# Patient Record
Sex: Male | Born: 1955 | Race: Black or African American | Hispanic: No | Marital: Married | Smoking: Former smoker
Health system: Southern US, Community
[De-identification: ages and names within clinical notes are randomized; demographics above are authoritative.]

## PROBLEM LIST (undated history)

## (undated) DIAGNOSIS — I469 Cardiac arrest, cause unspecified: Secondary | ICD-10-CM

## (undated) DIAGNOSIS — I509 Heart failure, unspecified: Secondary | ICD-10-CM

## (undated) DIAGNOSIS — I472 Ventricular tachycardia, unspecified: Secondary | ICD-10-CM

## (undated) DIAGNOSIS — IMO0001 Reserved for inherently not codable concepts without codable children: Secondary | ICD-10-CM

## (undated) DIAGNOSIS — J189 Pneumonia, unspecified organism: Secondary | ICD-10-CM

## (undated) DIAGNOSIS — E119 Type 2 diabetes mellitus without complications: Secondary | ICD-10-CM

## (undated) DIAGNOSIS — Z95810 Presence of automatic (implantable) cardiac defibrillator: Secondary | ICD-10-CM

## (undated) DIAGNOSIS — R21 Rash and other nonspecific skin eruption: Secondary | ICD-10-CM

## (undated) DIAGNOSIS — B1920 Unspecified viral hepatitis C without hepatic coma: Secondary | ICD-10-CM

## (undated) DIAGNOSIS — Z992 Dependence on renal dialysis: Secondary | ICD-10-CM

## (undated) DIAGNOSIS — E785 Hyperlipidemia, unspecified: Secondary | ICD-10-CM

## (undated) DIAGNOSIS — T8859XA Other complications of anesthesia, initial encounter: Secondary | ICD-10-CM

## (undated) DIAGNOSIS — E669 Obesity, unspecified: Secondary | ICD-10-CM

## (undated) DIAGNOSIS — K567 Ileus, unspecified: Secondary | ICD-10-CM

## (undated) DIAGNOSIS — I219 Acute myocardial infarction, unspecified: Secondary | ICD-10-CM

## (undated) DIAGNOSIS — T4145XA Adverse effect of unspecified anesthetic, initial encounter: Secondary | ICD-10-CM

## (undated) DIAGNOSIS — N183 Chronic kidney disease, stage 3 (moderate): Secondary | ICD-10-CM

## (undated) DIAGNOSIS — Z4502 Encounter for adjustment and management of automatic implantable cardiac defibrillator: Secondary | ICD-10-CM

## (undated) DIAGNOSIS — N186 End stage renal disease: Secondary | ICD-10-CM

## (undated) DIAGNOSIS — Z8679 Personal history of other diseases of the circulatory system: Secondary | ICD-10-CM

## (undated) DIAGNOSIS — I462 Cardiac arrest due to underlying cardiac condition: Secondary | ICD-10-CM

## (undated) DIAGNOSIS — R609 Edema, unspecified: Secondary | ICD-10-CM

## (undated) DIAGNOSIS — R079 Chest pain, unspecified: Secondary | ICD-10-CM

## (undated) DIAGNOSIS — Z9289 Personal history of other medical treatment: Secondary | ICD-10-CM

## (undated) DIAGNOSIS — I10 Essential (primary) hypertension: Secondary | ICD-10-CM

## (undated) DIAGNOSIS — B86 Scabies: Secondary | ICD-10-CM

## (undated) HISTORY — PX: EYE SURGERY: SHX253

## (undated) HISTORY — DX: Encounter for adjustment and management of automatic implantable cardiac defibrillator: Z45.02

## (undated) HISTORY — DX: Obesity, unspecified: E66.9

## (undated) HISTORY — DX: Ventricular tachycardia, unspecified: I47.20

## (undated) HISTORY — DX: Ventricular tachycardia: I47.2

## (undated) HISTORY — PX: BACK SURGERY: SHX140

## (undated) HISTORY — DX: Chest pain, unspecified: R07.9

## (undated) HISTORY — PX: CARDIAC CATHETERIZATION: SHX172

## (undated) HISTORY — PX: OTHER SURGICAL HISTORY: SHX169

---
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
## 1997-01-23 HISTORY — PX: CORONARY ARTERY BYPASS GRAFT: SHX141

## 2013-03-07 ENCOUNTER — Inpatient Hospital Stay (HOSPITAL_COMMUNITY): Payer: Medicaid Other

## 2013-03-07 ENCOUNTER — Inpatient Hospital Stay (HOSPITAL_COMMUNITY)
Admission: EM | Admit: 2013-03-07 | Discharge: 2013-03-25 | DRG: 224 | Disposition: A | Discharge status: Home Health | Payer: Medicaid Other | Attending: Internal Medicine | Admitting: Internal Medicine

## 2013-03-07 ENCOUNTER — Encounter (HOSPITAL_COMMUNITY): Payer: Self-pay | Admitting: Emergency Medicine

## 2013-03-07 ENCOUNTER — Emergency Department (HOSPITAL_COMMUNITY): Payer: Medicaid Other

## 2013-03-07 ENCOUNTER — Encounter (HOSPITAL_COMMUNITY)
Admission: EM | Disposition: A | Discharge status: Home Health | Payer: Self-pay | Source: Home / Self Care | Attending: Pulmonary Disease

## 2013-03-07 DIAGNOSIS — R339 Retention of urine, unspecified: Secondary | ICD-10-CM | POA: Diagnosis not present

## 2013-03-07 DIAGNOSIS — Z951 Presence of aortocoronary bypass graft: Secondary | ICD-10-CM

## 2013-03-07 DIAGNOSIS — E876 Hypokalemia: Secondary | ICD-10-CM | POA: Diagnosis not present

## 2013-03-07 DIAGNOSIS — I498 Other specified cardiac arrhythmias: Secondary | ICD-10-CM | POA: Diagnosis not present

## 2013-03-07 DIAGNOSIS — I472 Ventricular tachycardia, unspecified: Secondary | ICD-10-CM | POA: Insufficient documentation

## 2013-03-07 DIAGNOSIS — Y9210 Unspecified residential institution as the place of occurrence of the external cause: Secondary | ICD-10-CM | POA: Diagnosis not present

## 2013-03-07 DIAGNOSIS — J150 Pneumonia due to Klebsiella pneumoniae: Secondary | ICD-10-CM | POA: Diagnosis not present

## 2013-03-07 DIAGNOSIS — I501 Left ventricular failure: Secondary | ICD-10-CM | POA: Diagnosis present

## 2013-03-07 DIAGNOSIS — E7800 Pure hypercholesterolemia, unspecified: Secondary | ICD-10-CM | POA: Diagnosis present

## 2013-03-07 DIAGNOSIS — I469 Cardiac arrest, cause unspecified: Secondary | ICD-10-CM | POA: Diagnosis present

## 2013-03-07 DIAGNOSIS — IMO0002 Reserved for concepts with insufficient information to code with codable children: Secondary | ICD-10-CM | POA: Diagnosis not present

## 2013-03-07 DIAGNOSIS — J9600 Acute respiratory failure, unspecified whether with hypoxia or hypercapnia: Secondary | ICD-10-CM | POA: Diagnosis present

## 2013-03-07 DIAGNOSIS — E8779 Other fluid overload: Secondary | ICD-10-CM | POA: Diagnosis not present

## 2013-03-07 DIAGNOSIS — T462X5A Adverse effect of other antidysrhythmic drugs, initial encounter: Secondary | ICD-10-CM | POA: Diagnosis not present

## 2013-03-07 DIAGNOSIS — E870 Hyperosmolality and hypernatremia: Secondary | ICD-10-CM | POA: Diagnosis not present

## 2013-03-07 DIAGNOSIS — G931 Anoxic brain damage, not elsewhere classified: Secondary | ICD-10-CM | POA: Diagnosis present

## 2013-03-07 DIAGNOSIS — I4901 Ventricular fibrillation: Principal | ICD-10-CM | POA: Diagnosis present

## 2013-03-07 DIAGNOSIS — E875 Hyperkalemia: Secondary | ICD-10-CM | POA: Diagnosis not present

## 2013-03-07 DIAGNOSIS — J9601 Acute respiratory failure with hypoxia: Secondary | ICD-10-CM

## 2013-03-07 DIAGNOSIS — I2582 Chronic total occlusion of coronary artery: Secondary | ICD-10-CM | POA: Diagnosis present

## 2013-03-07 DIAGNOSIS — I10 Essential (primary) hypertension: Secondary | ICD-10-CM | POA: Diagnosis present

## 2013-03-07 DIAGNOSIS — Q039 Congenital hydrocephalus, unspecified: Secondary | ICD-10-CM

## 2013-03-07 DIAGNOSIS — D6489 Other specified anemias: Secondary | ICD-10-CM | POA: Diagnosis not present

## 2013-03-07 DIAGNOSIS — D696 Thrombocytopenia, unspecified: Secondary | ICD-10-CM | POA: Diagnosis present

## 2013-03-07 DIAGNOSIS — I2510 Atherosclerotic heart disease of native coronary artery without angina pectoris: Secondary | ICD-10-CM | POA: Diagnosis present

## 2013-03-07 DIAGNOSIS — E119 Type 2 diabetes mellitus without complications: Secondary | ICD-10-CM | POA: Diagnosis present

## 2013-03-07 DIAGNOSIS — Z7982 Long term (current) use of aspirin: Secondary | ICD-10-CM

## 2013-03-07 DIAGNOSIS — Z79899 Other long term (current) drug therapy: Secondary | ICD-10-CM

## 2013-03-07 DIAGNOSIS — R569 Unspecified convulsions: Secondary | ICD-10-CM

## 2013-03-07 DIAGNOSIS — E785 Hyperlipidemia, unspecified: Secondary | ICD-10-CM | POA: Diagnosis present

## 2013-03-07 DIAGNOSIS — I4729 Other ventricular tachycardia: Secondary | ICD-10-CM | POA: Diagnosis present

## 2013-03-07 HISTORY — DX: Essential (primary) hypertension: I10

## 2013-03-07 HISTORY — PX: LEFT HEART CATHETERIZATION WITH CORONARY/GRAFT ANGIOGRAM: SHX5450

## 2013-03-07 HISTORY — DX: Cardiac arrest, cause unspecified: I46.9

## 2013-03-07 HISTORY — DX: Edema, unspecified: R60.9

## 2013-03-07 HISTORY — DX: Cardiac arrest due to underlying cardiac condition: I46.2

## 2013-03-07 HISTORY — DX: Hyperlipidemia, unspecified: E78.5

## 2013-03-07 LAB — URINALYSIS, ROUTINE W REFLEX MICROSCOPIC
Bilirubin Urine: NEGATIVE
GLUCOSE, UA: NEGATIVE mg/dL
Ketones, ur: NEGATIVE mg/dL
Leukocytes, UA: NEGATIVE
NITRITE: NEGATIVE
Protein, ur: >300 mg/dL — AB
SPECIFIC GRAVITY, URINE: 1.017 (ref 1.005–1.030)
Urobilinogen, UA: 2 mg/dL — ABNORMAL HIGH (ref 0.0–1.0)
pH: 7 (ref 5.0–8.0)

## 2013-03-07 LAB — COMPREHENSIVE METABOLIC PANEL
ALK PHOS: 59 U/L (ref 39–117)
ALT: 54 U/L — AB (ref 0–53)
ALT: 59 U/L — AB (ref 0–53)
AST: 45 U/L — AB (ref 0–37)
AST: 49 U/L — ABNORMAL HIGH (ref 0–37)
Albumin: 2.6 g/dL — ABNORMAL LOW (ref 3.5–5.2)
Albumin: 2.3 g/dL — ABNORMAL LOW (ref 3.5–5.2)
Alkaline Phosphatase: 61 U/L (ref 39–117)
BILIRUBIN TOTAL: 0.3 mg/dL (ref 0.3–1.2)
BUN: 17 mg/dL (ref 6–23)
BUN: 19 mg/dL (ref 6–23)
CALCIUM: 8.5 mg/dL (ref 8.4–10.5)
CO2: 22 meq/L (ref 19–32)
CO2: 21 mEq/L (ref 19–32)
Calcium: 8.8 mg/dL (ref 8.4–10.5)
Chloride: 102 mEq/L (ref 96–112)
Chloride: 99 mEq/L (ref 96–112)
Creatinine, Ser: 0.91 mg/dL (ref 0.50–1.35)
Creatinine, Ser: 1.06 mg/dL (ref 0.50–1.35)
GFR calc Af Amer: 87 mL/min — ABNORMAL LOW (ref >90)
GFR calc non Af Amer: >90 mL/min (ref >90)
GFR, EST NON AFRICAN AMERICAN: 75 mL/min — AB (ref >90)
GLUCOSE: 206 mg/dL — AB (ref 70–99)
Glucose, Bld: 229 mg/dL — ABNORMAL HIGH (ref 70–99)
POTASSIUM: 4 meq/L (ref 3.7–5.3)
Potassium: 3.9 mEq/L (ref 3.7–5.3)
SODIUM: 136 meq/L — AB (ref 137–147)
Sodium: 139 mEq/L (ref 137–147)
Total Bilirubin: 0.4 mg/dL (ref 0.3–1.2)
Total Protein: 8.2 g/dL (ref 6.0–8.3)
Total Protein: 7.6 g/dL (ref 6.0–8.3)

## 2013-03-07 LAB — TRIGLYCERIDES: Triglycerides: 124 mg/dL (ref <150)

## 2013-03-07 LAB — DIFFERENTIAL
Basophils Absolute: 0 10*3/uL (ref 0.0–0.1)
Basophils Relative: 0 % (ref 0–1)
Eosinophils Absolute: 0.1 10*3/uL (ref 0.0–0.7)
Eosinophils Relative: 2 % (ref 0–5)
LYMPHS ABS: 2.3 10*3/uL (ref 0.7–4.0)
LYMPHS PCT: 39 % (ref 12–46)
Monocytes Absolute: 0.4 10*3/uL (ref 0.1–1.0)
Monocytes Relative: 7 % (ref 3–12)
Neutro Abs: 3.1 10*3/uL (ref 1.7–7.7)
Neutrophils Relative %: 52 % (ref 43–77)

## 2013-03-07 LAB — MRSA PCR SCREENING: MRSA by PCR: NEGATIVE

## 2013-03-07 LAB — LACTIC ACID, PLASMA: Lactic Acid, Venous: 2.7 mmol/L — ABNORMAL HIGH (ref 0.5–2.2)

## 2013-03-07 LAB — POCT I-STAT, CHEM 8
BUN: 18 mg/dL (ref 6–23)
CALCIUM ION: 1.19 mmol/L (ref 1.12–1.23)
CREATININE: 1 mg/dL (ref 0.50–1.35)
Chloride: 100 mEq/L (ref 96–112)
GLUCOSE: 212 mg/dL — AB (ref 70–99)
HCT: 37 % — ABNORMAL LOW (ref 39.0–52.0)
HEMOGLOBIN: 12.6 g/dL — AB (ref 13.0–17.0)
POTASSIUM: 3.8 meq/L (ref 3.7–5.3)
Sodium: 141 mEq/L (ref 137–147)
TCO2: 24 mmol/L (ref 0–100)

## 2013-03-07 LAB — GLUCOSE, CAPILLARY
GLUCOSE-CAPILLARY: 190 mg/dL — AB (ref 70–99)
Glucose-Capillary: 191 mg/dL — ABNORMAL HIGH (ref 70–99)
Glucose-Capillary: 172 mg/dL — ABNORMAL HIGH (ref 70–99)
Glucose-Capillary: 168 mg/dL — ABNORMAL HIGH (ref 70–99)
Glucose-Capillary: 211 mg/dL — ABNORMAL HIGH (ref 70–99)
Glucose-Capillary: 228 mg/dL — ABNORMAL HIGH (ref 70–99)

## 2013-03-07 LAB — BASIC METABOLIC PANEL
BUN: 22 mg/dL (ref 6–23)
BUN: 23 mg/dL (ref 6–23)
CHLORIDE: 99 meq/L (ref 96–112)
CO2: 24 meq/L (ref 19–32)
CO2: 24 meq/L (ref 19–32)
Calcium: 8 mg/dL — ABNORMAL LOW (ref 8.4–10.5)
Calcium: 8.2 mg/dL — ABNORMAL LOW (ref 8.4–10.5)
Chloride: 102 mEq/L (ref 96–112)
Creatinine, Ser: 1.16 mg/dL (ref 0.50–1.35)
Creatinine, Ser: 1.06 mg/dL (ref 0.50–1.35)
GFR calc Af Amer: 78 mL/min — ABNORMAL LOW (ref >90)
GFR calc Af Amer: 87 mL/min — ABNORMAL LOW (ref >90)
GFR calc non Af Amer: 67 mL/min — ABNORMAL LOW (ref >90)
GFR calc non Af Amer: 75 mL/min — ABNORMAL LOW (ref >90)
Glucose, Bld: 196 mg/dL — ABNORMAL HIGH (ref 70–99)
Glucose, Bld: 295 mg/dL — ABNORMAL HIGH (ref 70–99)
POTASSIUM: 4.8 meq/L (ref 3.7–5.3)
POTASSIUM: 4.1 meq/L (ref 3.7–5.3)
SODIUM: 137 meq/L (ref 137–147)
SODIUM: 138 meq/L (ref 137–147)

## 2013-03-07 LAB — POCT I-STAT 3, ART BLOOD GAS (G3+)
ACID-BASE DEFICIT: 8 mmol/L — AB (ref 0.0–2.0)
Acid-Base Excess: 2 mmol/L (ref 0.0–2.0)
Acid-Base Excess: 2 mmol/L (ref 0.0–2.0)
BICARBONATE: 27.4 meq/L — AB (ref 20.0–24.0)
BICARBONATE: 19.1 meq/L — AB (ref 20.0–24.0)
BICARBONATE: 27.2 meq/L — AB (ref 20.0–24.0)
O2 Saturation: 100 %
O2 Saturation: 92 %
O2 Saturation: 100 %
PH ART: 7.38 (ref 7.350–7.450)
PH ART: 7.408 (ref 7.350–7.450)
PO2 ART: 282 mmHg — AB (ref 80.0–100.0)
Patient temperature: 98.6
Patient temperature: 98.7
TCO2: 29 mmol/L (ref 0–100)
TCO2: 20 mmol/L (ref 0–100)
TCO2: 28 mmol/L (ref 0–100)
pCO2 arterial: 46.2 mmHg — ABNORMAL HIGH (ref 35.0–45.0)
pCO2 arterial: 42.4 mmHg (ref 35.0–45.0)
pCO2 arterial: 43.1 mmHg (ref 35.0–45.0)
pH, Arterial: 7.261 — ABNORMAL LOW (ref 7.350–7.450)
pO2, Arterial: 236 mmHg — ABNORMAL HIGH (ref 80.0–100.0)
pO2, Arterial: 74 mmHg — ABNORMAL LOW (ref 80.0–100.0)

## 2013-03-07 LAB — CBC
HCT: 33.5 % — ABNORMAL LOW (ref 39.0–52.0)
HCT: 33.9 % — ABNORMAL LOW (ref 39.0–52.0)
Hemoglobin: 11.8 g/dL — ABNORMAL LOW (ref 13.0–17.0)
Hemoglobin: 12.1 g/dL — ABNORMAL LOW (ref 13.0–17.0)
MCH: 31.1 pg (ref 26.0–34.0)
MCH: 31.5 pg (ref 26.0–34.0)
MCHC: 35.2 g/dL (ref 30.0–36.0)
MCHC: 35.7 g/dL (ref 30.0–36.0)
MCV: 88.3 fL (ref 78.0–100.0)
MCV: 88.4 fL (ref 78.0–100.0)
PLATELETS: 208 10*3/uL (ref 150–400)
Platelets: 166 10*3/uL (ref 150–400)
RBC: 3.79 MIL/uL — AB (ref 4.22–5.81)
RBC: 3.84 MIL/uL — ABNORMAL LOW (ref 4.22–5.81)
RDW: 12.9 % (ref 11.5–15.5)
RDW: 12.9 % (ref 11.5–15.5)
WBC: 6 10*3/uL (ref 4.0–10.5)
WBC: 6.4 10*3/uL (ref 4.0–10.5)

## 2013-03-07 LAB — HEMOGLOBIN A1C
Hgb A1c MFr Bld: 6.3 % — ABNORMAL HIGH (ref <5.7)
Mean Plasma Glucose: 134 mg/dL — ABNORMAL HIGH (ref <117)

## 2013-03-07 LAB — APTT
APTT: 31 s (ref 24–37)
APTT: 31 s (ref 24–37)

## 2013-03-07 LAB — PROTIME-INR
INR: 1.21 (ref 0.00–1.49)
INR: 1.28 (ref 0.00–1.49)
INR: 1.32 (ref 0.00–1.49)
PROTHROMBIN TIME: 15 s (ref 11.6–15.2)
PROTHROMBIN TIME: 15.7 s — AB (ref 11.6–15.2)
Prothrombin Time: 16.1 seconds — ABNORMAL HIGH (ref 11.6–15.2)

## 2013-03-07 LAB — POCT I-STAT TROPONIN I: Troponin i, poc: 0.02 ng/mL (ref 0.00–0.08)

## 2013-03-07 LAB — CORTISOL: Cortisol, Plasma: 18.8 ug/dL

## 2013-03-07 LAB — RAPID URINE DRUG SCREEN, HOSP PERFORMED
Amphetamines: NOT DETECTED
BARBITURATES: NOT DETECTED
BENZODIAZEPINES: NOT DETECTED
COCAINE: NOT DETECTED
Opiates: NOT DETECTED
Tetrahydrocannabinol: NOT DETECTED

## 2013-03-07 LAB — PROCALCITONIN

## 2013-03-07 LAB — PHOSPHORUS: Phosphorus: 3.8 mg/dL (ref 2.3–4.6)

## 2013-03-07 LAB — URINE MICROSCOPIC-ADD ON

## 2013-03-07 LAB — TROPONIN I: Troponin I: <0.3 ng/mL (ref <0.30)

## 2013-03-07 LAB — MAGNESIUM: Magnesium: 1.5 mg/dL (ref 1.5–2.5)

## 2013-03-07 LAB — POCT ACTIVATED CLOTTING TIME: Activated Clotting Time: 144 seconds

## 2013-03-07 SURGERY — LEFT HEART CATHETERIZATION WITH CORONARY/GRAFT ANGIOGRAM

## 2013-03-07 MED ORDER — ACETAMINOPHEN 160 MG/5ML PO SOLN
650.0000 mg | Freq: Four times a day (QID) | ORAL | Status: DC | PRN
  Administered 2013-03-07 – 2013-03-12 (×6): 650 mg
  Filled 2013-03-07 (×6): qty 20.3

## 2013-03-07 MED ORDER — PROPOFOL 10 MG/ML IV EMUL
0.0000 ug/kg/min | INTRAVENOUS | Status: DC
  Administered 2013-03-07: 1 ug/kg/min via INTRAVENOUS
  Administered 2013-03-07 (×5): 10 ug/kg/min via INTRAVENOUS
  Administered 2013-03-08: 30 ug/kg/min via INTRAVENOUS
  Filled 2013-03-07 (×4): qty 100

## 2013-03-07 MED ORDER — PANTOPRAZOLE SODIUM 40 MG IV SOLR
40.0000 mg | Freq: Every day | INTRAVENOUS | Status: DC
  Administered 2013-03-07 – 2013-03-11 (×5): 40 mg via INTRAVENOUS
  Filled 2013-03-07 (×6): qty 40

## 2013-03-07 MED ORDER — CISATRACURIUM BOLUS VIA INFUSION: 0.1000 mg/kg | Freq: Once | INTRAVENOUS | Status: DC

## 2013-03-07 MED ORDER — FENTANYL CITRATE 0.05 MG/ML IJ SOLN
0.0000 ug/h | INTRAMUSCULAR | Status: DC
  Administered 2013-03-07: 50 ug/h via INTRAVENOUS
  Administered 2013-03-08: 150 ug/h via INTRAVENOUS
  Filled 2013-03-07 (×2): qty 50

## 2013-03-07 MED ORDER — NITROGLYCERIN IN D5W 200-5 MCG/ML-% IV SOLN
10.0000 ug/min | INTRAVENOUS | Status: DC
Start: 2013-03-07 — End: 2013-03-08
  Administered 2013-03-07: 10 ug/min via INTRAVENOUS
  Filled 2013-03-07: qty 250

## 2013-03-07 MED ORDER — METOPROLOL TARTRATE 1 MG/ML IV SOLN: 5.0000 mg | INTRAVENOUS | Status: DC | PRN

## 2013-03-07 MED ORDER — FENTANYL CITRATE 0.05 MG/ML IJ SOLN
INTRAMUSCULAR | Status: AC
  Filled 2013-03-07: qty 2

## 2013-03-07 MED ORDER — SODIUM BICARBONATE 8.4 % IV SOLN
INTRAVENOUS | Status: AC
Start: 2013-03-07 — End: 2013-03-07
  Filled 2013-03-07: qty 50

## 2013-03-07 MED ORDER — MIDAZOLAM HCL 2 MG/2ML IJ SOLN
INTRAMUSCULAR | Status: AC
  Filled 2013-03-07: qty 2

## 2013-03-07 MED ORDER — ASPIRIN 81 MG PO CHEW: 81.0000 mg | CHEWABLE_TABLET | ORAL | Status: DC

## 2013-03-07 MED ORDER — SODIUM CHLORIDE 0.9 % IJ SOLN: 3.0000 mL | Freq: Two times a day (BID) | INTRAMUSCULAR | Status: DC

## 2013-03-07 MED ORDER — SODIUM CHLORIDE 0.9 % IV SOLN: 2000.0000 mL | Freq: Once | INTRAVENOUS | Status: DC

## 2013-03-07 MED ORDER — LIDOCAINE HCL (CARDIAC) 20 MG/ML IV SOLN
INTRAVENOUS | Status: AC
  Filled 2013-03-07: qty 5

## 2013-03-07 MED ORDER — ETOMIDATE 2 MG/ML IV SOLN
INTRAVENOUS | Status: AC
  Filled 2013-03-07: qty 20

## 2013-03-07 MED ORDER — SODIUM CHLORIDE 0.9 % IV SOLN: 250.0000 mL | INTRAVENOUS | Status: DC | PRN

## 2013-03-07 MED ORDER — SODIUM BICARBONATE 8.4 % IV SOLN
INTRAVENOUS | Status: AC
  Filled 2013-03-07: qty 50

## 2013-03-07 MED ORDER — METOPROLOL TARTRATE 1 MG/ML IV SOLN
INTRAVENOUS | Status: AC
  Filled 2013-03-07: qty 5

## 2013-03-07 MED ORDER — SODIUM CHLORIDE 0.9 % IJ SOLN: 3.0000 mL | INTRAMUSCULAR | Status: DC | PRN

## 2013-03-07 MED ORDER — CISATRACURIUM BOLUS VIA INFUSION
0.0500 mg/kg | INTRAVENOUS | Status: DC | PRN
  Filled 2013-03-07: qty 4

## 2013-03-07 MED ORDER — FENTANYL BOLUS VIA INFUSION
50.0000 ug | INTRAVENOUS | Status: DC | PRN
  Filled 2013-03-07: qty 100

## 2013-03-07 MED ORDER — BIOTENE DRY MOUTH MT LIQD
15.0000 mL | Freq: Four times a day (QID) | OROMUCOSAL | Status: DC
  Administered 2013-03-08 – 2013-03-13 (×24): 15 mL via OROMUCOSAL

## 2013-03-07 MED ORDER — ASPIRIN 81 MG PO CHEW
324.0000 mg | CHEWABLE_TABLET | Freq: Every day | ORAL | Status: DC
  Administered 2013-03-07 – 2013-03-15 (×8): 324 mg
  Filled 2013-03-07 (×9): qty 4

## 2013-03-07 MED ORDER — ARTIFICIAL TEARS OP OINT
TOPICAL_OINTMENT | Freq: Three times a day (TID) | OPHTHALMIC | Status: DC
  Administered 2013-03-07 – 2013-03-09 (×5): via OPHTHALMIC
  Filled 2013-03-07 (×3): qty 3.5

## 2013-03-07 MED ORDER — CHLORHEXIDINE GLUCONATE 0.12 % MT SOLN
15.0000 mL | Freq: Two times a day (BID) | OROMUCOSAL | Status: DC
  Administered 2013-03-07 – 2013-03-13 (×12): 15 mL via OROMUCOSAL
  Filled 2013-03-07 (×12): qty 15

## 2013-03-07 MED ORDER — SODIUM CHLORIDE 0.9 % IV SOLN: 1.0000 mL/kg/h | INTRAVENOUS | Status: DC

## 2013-03-07 MED ORDER — MIDAZOLAM HCL 2 MG/2ML IJ SOLN: 2.0000 mg | Freq: Once | INTRAMUSCULAR | Status: DC

## 2013-03-07 MED ORDER — ROCURONIUM BROMIDE 50 MG/5ML IV SOLN
INTRAVENOUS | Status: AC | PRN
  Administered 2013-03-07: 70 mg via INTRAVENOUS

## 2013-03-07 MED ORDER — FENTANYL CITRATE 0.05 MG/ML IJ SOLN: 50.0000 ug/kg | Freq: Once | INTRAMUSCULAR | Status: DC

## 2013-03-07 MED ORDER — HEPARIN SODIUM (PORCINE) 1000 UNIT/ML IJ SOLN
INTRAMUSCULAR | Status: AC
  Filled 2013-03-07: qty 1

## 2013-03-07 MED ORDER — SODIUM CHLORIDE 0.9 % IV SOLN
INTRAVENOUS | Status: DC
  Administered 2013-03-07: 17:00:00 via INTRAVENOUS

## 2013-03-07 MED ORDER — SODIUM CHLORIDE 0.9 % IV SOLN
INTRAVENOUS | Status: DC
  Administered 2013-03-07: 18:00:00 via INTRAVENOUS

## 2013-03-07 MED ORDER — AMIODARONE HCL IN DEXTROSE 360-4.14 MG/200ML-% IV SOLN
30.0000 mg/h | INTRAVENOUS | Status: DC
  Administered 2013-03-07 – 2013-03-08 (×2): 30 mg/h via INTRAVENOUS
  Filled 2013-03-07 (×6): qty 200

## 2013-03-07 MED ORDER — MIDAZOLAM HCL 2 MG/2ML IJ SOLN
2.0000 mg | Freq: Once | INTRAMUSCULAR | Status: AC
  Administered 2013-03-07: 2 mg via INTRAVENOUS
  Filled 2013-03-07: qty 2

## 2013-03-07 MED ORDER — HEPARIN (PORCINE) IN NACL 2-0.9 UNIT/ML-% IJ SOLN
INTRAMUSCULAR | Status: AC
  Filled 2013-03-07: qty 1000

## 2013-03-07 MED ORDER — INSULIN ASPART 100 UNIT/ML ~~LOC~~ SOLN
0.0000 [IU] | SUBCUTANEOUS | Status: DC
  Administered 2013-03-07: 2 [IU] via SUBCUTANEOUS
  Administered 2013-03-07: 5 [IU] via SUBCUTANEOUS
  Administered 2013-03-08: 1 [IU] via SUBCUTANEOUS
  Administered 2013-03-08: 3 [IU] via SUBCUTANEOUS
  Administered 2013-03-09 – 2013-03-10 (×3): 1 [IU] via SUBCUTANEOUS
  Administered 2013-03-11 (×3): 2 [IU] via SUBCUTANEOUS
  Administered 2013-03-11: 1 [IU] via SUBCUTANEOUS
  Administered 2013-03-11 – 2013-03-12 (×3): 2 [IU] via SUBCUTANEOUS
  Administered 2013-03-12: 1 [IU] via SUBCUTANEOUS
  Administered 2013-03-12: 2 [IU] via SUBCUTANEOUS
  Administered 2013-03-12: 1 [IU] via SUBCUTANEOUS
  Administered 2013-03-13 (×2): 2 [IU] via SUBCUTANEOUS
  Administered 2013-03-13: 1 [IU] via SUBCUTANEOUS
  Administered 2013-03-13: 2 [IU] via SUBCUTANEOUS
  Administered 2013-03-14: 1 [IU] via SUBCUTANEOUS
  Administered 2013-03-14 (×2): 2 [IU] via SUBCUTANEOUS
  Administered 2013-03-14: 1 [IU] via SUBCUTANEOUS
  Administered 2013-03-15 – 2013-03-16 (×9): 2 [IU] via SUBCUTANEOUS

## 2013-03-07 MED ORDER — AMIODARONE HCL IN DEXTROSE 360-4.14 MG/200ML-% IV SOLN
60.0000 mg/h | INTRAVENOUS | Status: DC
  Administered 2013-03-07: 60 mg/h via INTRAVENOUS

## 2013-03-07 MED ORDER — SODIUM CHLORIDE 0.9 % IV SOLN: INTRAVENOUS | Status: AC

## 2013-03-07 MED ORDER — ETOMIDATE 2 MG/ML IV SOLN
INTRAVENOUS | Status: AC | PRN
  Administered 2013-03-07: 20 mg via INTRAVENOUS

## 2013-03-07 MED ORDER — FENTANYL CITRATE 0.05 MG/ML IJ SOLN
100.0000 ug | Freq: Once | INTRAMUSCULAR | Status: AC
  Administered 2013-03-07: 100 ug via INTRAVENOUS

## 2013-03-07 MED ORDER — ROCURONIUM BROMIDE 50 MG/5ML IV SOLN
INTRAVENOUS | Status: AC
  Filled 2013-03-07: qty 2

## 2013-03-07 MED ORDER — LIDOCAINE HCL (PF) 1 % IJ SOLN
INTRAMUSCULAR | Status: AC
  Filled 2013-03-07: qty 30

## 2013-03-07 MED ORDER — NITROGLYCERIN 0.2 MG/ML ON CALL CATH LAB
INTRAVENOUS | Status: AC
  Filled 2013-03-07: qty 1

## 2013-03-07 MED ORDER — NOREPINEPHRINE BITARTRATE 1 MG/ML IJ SOLN: 0.5000 ug/min | INTRAMUSCULAR | Status: DC

## 2013-03-07 MED ORDER — AMIODARONE HCL IN DEXTROSE 360-4.14 MG/200ML-% IV SOLN
60.0000 mg/h | INTRAVENOUS | Status: AC
  Administered 2013-03-07: 60 mg/h via INTRAVENOUS

## 2013-03-07 MED ORDER — FENTANYL CITRATE 0.05 MG/ML IJ SOLN: 50.0000 ug | Freq: Once | INTRAMUSCULAR | Status: DC

## 2013-03-07 MED ORDER — SUCCINYLCHOLINE CHLORIDE 20 MG/ML IJ SOLN
INTRAMUSCULAR | Status: AC
  Filled 2013-03-07: qty 1

## 2013-03-07 MED ORDER — SODIUM CHLORIDE 0.9 % IV SOLN
1.0000 ug/kg/min | INTRAVENOUS | Status: DC
  Administered 2013-03-07: 1 ug/kg/min via INTRAVENOUS
  Administered 2013-03-08: 1.5 ug/kg/min via INTRAVENOUS
  Filled 2013-03-07 (×2): qty 20

## 2013-03-07 MED ORDER — PROPOFOL 10 MG/ML IV EMUL
INTRAVENOUS | Status: AC
  Filled 2013-03-07: qty 100

## 2013-03-07 MED ORDER — NOREPINEPHRINE BITARTRATE 1 MG/ML IJ SOLN
0.5000 ug/min | INTRAVENOUS | Status: DC
  Administered 2013-03-07: 5 ug/min via INTRAVENOUS
  Filled 2013-03-07: qty 16

## 2013-03-07 MED ORDER — FENTANYL CITRATE 0.05 MG/ML IJ SOLN: 50.0000 ug | INTRAMUSCULAR | Status: DC | PRN

## 2013-03-07 MED ORDER — ASPIRIN 300 MG RE SUPP: 300.0000 mg | RECTAL | Status: DC

## 2013-03-07 MED ORDER — AMIODARONE LOAD VIA INFUSION
150.0000 mg | Freq: Once | INTRAVENOUS | Status: AC
  Administered 2013-03-07: 150 mg via INTRAVENOUS
  Filled 2013-03-07: qty 83.34

## 2013-03-07 NOTE — Procedures (Signed)
Arterial Catheter Insertion Procedure Note Donovann Orrick [id number] [birth year]  Procedure: Insertion of Arterial Catheter  Indications: Blood pressure monitoring and Frequent blood sampling  Procedure Details Consent: Unable to obtain consent because of emergent medical necessity. Time Out: Verified patient identification, verified procedure, site/side was marked, verified correct patient position, special equipment/implants available, medications/allergies/relevent history reviewed, required imaging and test results available.  Performed  Maximum sterile technique was used including antiseptics, cap, gloves, gown, hand hygiene, mask and sheet. Skin prep: Chlorhexidine; local anesthetic administered 20 gauge catheter was inserted into right femoral artery using the Seldinger technique.  Evaluation Blood flow good; BP tracing good. Complications: No apparent complications.   [name] 03/07/2013  Coding again in ed Fem access [name]. Titus Mould, MD, [place] Pgr: [facility]

## 2013-03-07 NOTE — ED Notes (Signed)
Critical care at bedside  

## 2013-03-07 NOTE — ED Notes (Signed)
Pt became tachycardic and hyper tensive.  Given versed and fentanyl per critical care.  HR began to drop, pt began having multiple pvc's and pt went into witnessed v-fib/torsades.  Critical care at bedside. Pt shocked x 1 and pt went back into ns.

## 2013-03-07 NOTE — ED Notes (Signed)
CBG 191 

## 2013-03-07 NOTE — Consult Note (Signed)
Reason for Consult: Status post V. fib cardiac arrest Referring Physician: CCM  Eric [name] is an 58 y.o. male.  HPI: Patient is 58 year old male with past medical history significant for coronary artery disease status post CABG, hypertension, diabetes mellitus, hypercholesteremia, had seizure episode at [employer] followed by cardiac arrest requiring CPR and intubation on the field in ER patient had recurrent episodes of V. fib arrest requiring defibrillation. No history could be opted and patient is intubated sedated and no family member is available. EKG done in the ER showed normal sinus rhythm with septal Q waves with no acute ischemic changes. Patient had CT of the brain which showed no evidence of acute bleed but it shows a hydrocephalus with dilated third and lateral ventricles. Due to recurrent episodes of V. fib witnessed and significant cardiac history will take patient emergently to cath lab to rule out for significant ischemia.  Past Medical History  Diagnosis Date  . Hypertension   . Hyperlipemia   . 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 Edema     No past surgical history on file.  No family history on file.  Social History:  has no tobacco, alcohol, and drug history on file.  Allergies: No Known Allergies  Medications: I have reviewed the patient's current medications.  Results for orders placed during the hospital encounter of 03/07/13 (from the past 48 hour(s))  GLUCOSE, CAPILLARY     Status: Abnormal   Collection Time    03/07/13 11:24 AM      Result Value Ref Range   Glucose-Capillary 191 (*) 70 - 99 mg/dL   Comment 1 Documented in Chart     Comment 2 Notify RN    PROTIME-INR     Status: None   Collection Time    03/07/13 11:36 AM      Result Value Ref Range   Prothrombin Time 15.0  11.6 - 15.2 seconds   INR 1.21  0.00 - 1.49  APTT     Status: None   Collection Time    03/07/13 11:36 AM      Result Value Ref Range   aPTT 31   24 - 37 seconds  CBC     Status: Abnormal   Collection Time    03/07/13 11:36 AM      Result Value Ref Range   WBC 6.0  4.0 - 10.5 K/uL   RBC 3.79 (*) 4.22 - 5.81 MIL/uL   Hemoglobin 11.8 (*) 13.0 - 17.0 g/dL   HCT 33.5 (*) 39.0 - 52.0 %   MCV 88.4  78.0 - 100.0 fL   MCH 31.1  26.0 - 34.0 pg   MCHC 35.2  30.0 - 36.0 g/dL   RDW 12.9  11.5 - 15.5 %   Platelets 166  150 - 400 K/uL  DIFFERENTIAL     Status: None   Collection Time    03/07/13 11:36 AM      Result Value Ref Range   Neutrophils Relative % 52  43 - 77 %   Neutro Abs 3.1  1.7 - 7.7 K/uL   Lymphocytes Relative 39  12 - 46 %   Lymphs Abs 2.3  0.7 - 4.0 K/uL   Monocytes Relative 7  3 - 12 %   Monocytes Absolute 0.4  0.1 - 1.0 K/uL   Eosinophils Relative 2  0 - 5 %   Eosinophils Absolute 0.1  0.0 - 0.7 K/uL   Basophils Relative 0  0 - 1 %   Basophils Absolute 0.0  0.0 - 0.1 K/uL  COMPREHENSIVE METABOLIC PANEL     Status: Abnormal   Collection Time    03/07/13 11:36 AM      Result Value Ref Range   Sodium 139  137 - 147 mEq/L   Potassium 4.0  3.7 - 5.3 mEq/L   Chloride 102  96 - 112 mEq/L   CO2 22  19 - 32 mEq/L   Glucose, Bld 206 (*) 70 - 99 mg/dL   BUN 17  6 - 23 mg/dL   Creatinine, Ser 0.91  0.50 - 1.35 mg/dL   Calcium 8.8  8.4 - 10.5 mg/dL   Total Protein 8.2  6.0 - 8.3 g/dL   Albumin 2.6 (*) 3.5 - 5.2 g/dL   AST 49 (*) 0 - 37 U/L   ALT 59 (*) 0 - 53 U/L   Alkaline Phosphatase 61  39 - 117 U/L   Total Bilirubin 0.3  0.3 - 1.2 mg/dL   GFR calc non Af Amer >90  >90 mL/min   GFR calc Af Amer >90  >90 mL/min   Comment: (NOTE)     The eGFR has been calculated using the CKD EPI equation.     This calculation has not been validated in all clinical situations.     eGFR's persistently <90 mL/min signify possible Chronic Kidney     Disease.  TROPONIN I     Status: None   Collection Time    03/07/13 11:36 AM      Result Value Ref Range   Troponin I <0.30  <0.30 ng/mL   Comment:            Due to the release  kinetics of cTnI,     a negative result within the first hours     of the onset of symptoms does not rule out     myocardial infarction with certainty.     If myocardial infarction is still suspected,     repeat the test at appropriate intervals.  POCT I-STAT TROPONIN I     Status: None   Collection Time    03/07/13 11:51 AM      Result Value Ref Range   Troponin i, poc 0.02  0.00 - 0.08 ng/mL   Comment 3            Comment: Due to the release kinetics of cTnI,     a negative result within the first hours     of the onset of symptoms does not rule out     myocardial infarction with certainty.     If myocardial infarction is still suspected,     repeat the test at appropriate intervals.  POCT I-STAT, CHEM 8     Status: Abnormal   Collection Time    03/07/13 11:52 AM      Result Value Ref Range   Sodium 141  137 - 147 mEq/L   Potassium 3.8  3.7 - 5.3 mEq/L   Chloride 100  96 - 112 mEq/L   BUN 18  6 - 23 mg/dL   Creatinine, Ser 1.00  0.50 - 1.35 mg/dL   Glucose, Bld 212 (*) 70 - 99 mg/dL   Calcium, Ion 1.19  1.12 - 1.23 mmol/L   TCO2 24  0 - 100 mmol/L   Hemoglobin 12.6 (*) 13.0 - 17.0 g/dL   HCT 37.0 (*) 39.0 - 52.0 %  URINALYSIS, ROUTINE W REFLEX MICROSCOPIC  Status: Abnormal   Collection Time    03/07/13 12:19 PM      Result Value Ref Range   Color, Urine YELLOW  YELLOW   APPearance HAZY (*) CLEAR   Specific Gravity, Urine 1.017  1.005 - 1.030   pH 7.0  5.0 - 8.0   Glucose, UA NEGATIVE  NEGATIVE mg/dL   Hgb urine dipstick MODERATE (*) NEGATIVE   Bilirubin Urine NEGATIVE  NEGATIVE   Ketones, ur NEGATIVE  NEGATIVE mg/dL   Protein, ur >300 (*) NEGATIVE mg/dL   Urobilinogen, UA 2.0 (*) 0.0 - 1.0 mg/dL   Nitrite NEGATIVE  NEGATIVE   Leukocytes, UA NEGATIVE  NEGATIVE  URINE MICROSCOPIC-ADD ON     Status: Abnormal   Collection Time    03/07/13 12:19 PM      Result Value Ref Range   Squamous Epithelial / LPF FEW (*) RARE   WBC, UA 0-2  <3 WBC/hpf   RBC / HPF 3-6  <3  RBC/hpf   Bacteria, UA FEW (*) RARE   Urine-Other SPERM PRESENT    URINE RAPID DRUG SCREEN (HOSP PERFORMED)     Status: None   Collection Time    03/07/13 12:24 PM      Result Value Ref Range   Opiates NONE DETECTED  NONE DETECTED   Cocaine NONE DETECTED  NONE DETECTED   Benzodiazepines NONE DETECTED  NONE DETECTED   Amphetamines NONE DETECTED  NONE DETECTED   Tetrahydrocannabinol NONE DETECTED  NONE DETECTED   Barbiturates NONE DETECTED  NONE DETECTED   Comment:            DRUG SCREEN FOR MEDICAL PURPOSES     ONLY.  IF CONFIRMATION IS NEEDED     FOR ANY PURPOSE, NOTIFY LAB     WITHIN 5 DAYS.                LOWEST DETECTABLE LIMITS     FOR URINE DRUG SCREEN     Drug Class       Cutoff (ng/mL)     Amphetamine      1000     Barbiturate      200     Benzodiazepine   881     Tricyclics       103     Opiates          300     Cocaine          300     THC              50  POCT I-STAT 3, BLOOD GAS (G3+)     Status: Abnormal   Collection Time    03/07/13  1:02 PM      Result Value Ref Range   pH, Arterial 7.380  7.350 - 7.450   pCO2 arterial 46.2 (*) 35.0 - 45.0 mmHg   pO2, Arterial 236.0 (*) 80.0 - 100.0 mmHg   Bicarbonate 27.4 (*) 20.0 - 24.0 mEq/L   TCO2 29  0 - 100 mmol/L   O2 Saturation 100.0     Acid-Base Excess 2.0  0.0 - 2.0 mmol/L   Patient temperature 98.6 F     Collection site ARTERIAL LINE     Drawn by Operator     Sample type ARTERIAL      Ct Head (brain) Wo Contrast  03/07/2013   CLINICAL DATA:  Weakness seizure, became pulseless and apneic, post CPR, history hypertension, diabetes  EXAM: CT HEAD WITHOUT CONTRAST  CT CERVICAL SPINE WITHOUT CONTRAST  TECHNIQUE: Multidetector CT imaging of the head and cervical spine was performed following the standard protocol without intravenous contrast. Multiplanar CT image reconstructions of the cervical spine were also generated.  COMPARISON:  None  FINDINGS: CT HEAD FINDINGS  Motion artifacts, for which repeat imaging was  performed.  Diffuse dilatation of the lateral and third ventricles compatible of hydrocephalus.  Fourth rectal decompressed.  Scattered fall seen calcification.  No midline shift or mass effect.  Otherwise normal appearance of brain parenchyma.  No intracranial hemorrhage, mass lesion or evidence acute infarction.  No extra-axial fluid collections.  Beam hardening artifacts of dental origin.  Bones and sinuses unremarkable.  CT CERVICAL SPINE FINDINGS  Visualized skullbase intact.  Osseous mineralization normal.  Endotracheal and nasogastric tubes, with nasogastric tube coiled in pharynx.  Question infiltrate right apex.  Vertebral body and disc space heights maintained.  Prevertebral soft tissues normal thickness.  No acute fracture, subluxation or bone destruction.  IMPRESSION: Hydrocephalus, with diffuse dilatation of the lateral and third ventricles.  Lack of fourth ventricular dilatation raises question of aquaductal stenosis; consider followup MR imaging of the brain with and without contrast to assess.  No other intracranial abnormalities.  No acute cervical spine abnormalities.  Nasogastric tube coiled in pharynx - this finding was called to [facility] RN 2-Heart at 1418 hr on 03/07/2013.  Question right upper lobe infiltrate.   Electronically Signed   By: Lavonia Dana M.D.   On: 03/07/2013 14:19   Ct Cervical Spine Wo Contrast  03/07/2013   CLINICAL DATA:  Weakness seizure, became pulseless and apneic, post CPR, history hypertension, diabetes  EXAM: CT HEAD WITHOUT CONTRAST  CT CERVICAL SPINE WITHOUT CONTRAST  TECHNIQUE: Multidetector CT imaging of the head and cervical spine was performed following the standard protocol without intravenous contrast. Multiplanar CT image reconstructions of the cervical spine were also generated.  COMPARISON:  None  FINDINGS: CT HEAD FINDINGS  Motion artifacts, for which repeat imaging was performed.  Diffuse dilatation of the lateral and third ventricles compatible of  hydrocephalus.  Fourth rectal decompressed.  Scattered fall seen calcification.  No midline shift or mass effect.  Otherwise normal appearance of brain parenchyma.  No intracranial hemorrhage, mass lesion or evidence acute infarction.  No extra-axial fluid collections.  Beam hardening artifacts of dental origin.  Bones and sinuses unremarkable.  CT CERVICAL SPINE FINDINGS  Visualized skullbase intact.  Osseous mineralization normal.  Endotracheal and nasogastric tubes, with nasogastric tube coiled in pharynx.  Question infiltrate right apex.  Vertebral body and disc space heights maintained.  Prevertebral soft tissues normal thickness.  No acute fracture, subluxation or bone destruction.  IMPRESSION: Hydrocephalus, with diffuse dilatation of the lateral and third ventricles.  Lack of fourth ventricular dilatation raises question of aquaductal stenosis; consider followup MR imaging of the brain with and without contrast to assess.  No other intracranial abnormalities.  No acute cervical spine abnormalities.  Nasogastric tube coiled in pharynx - this finding was called to [facility] RN 2-Heart at 1418 hr on 03/07/2013.  Question right upper lobe infiltrate.   Electronically Signed   By: Lavonia Dana M.D.   On: 03/07/2013 14:19   Dg Chest Port 1 View  03/07/2013   CLINICAL DATA:  Cardiac arrest.  Central line placement.  EXAM: PORTABLE CHEST - 1 VIEW  COMPARISON:  03/07/2013  FINDINGS: Endotracheal tube and NG tube are in good position. Left jugular vein catheter is been inserted the tip is at the  cavoatrial junction in good position. No pneumothorax. Left lung remains clear. There is slight haziness in the right perihilar region.  IMPRESSION: 1. Central venous catheter in good position. 2. New slight haziness in the right perihilar region. The possibility of aspiration pneumonitis should be considered.   Electronically Signed   By: Rozetta Nunnery M.D.   On: 03/07/2013 13:12   Dg Chest Portable 1 View  03/07/2013    CLINICAL DATA:  Intubation.  EXAM: PORTABLE CHEST - 1 VIEW  COMPARISON:  None.  FINDINGS: Endotracheal tube tip in satisfactory position projecting approximately 4 cm above the carina. Nasogastric tube tip in the distal esophagus just above the EG junction. Prior sternotomy for CABG. Cardiac silhouette upper normal in size to slightly enlarged for AP portable technique. Linear atelectasis or scar in the lingula. Lungs otherwise clear. No localized airspace consolidation. No pleural effusions. No pneumothorax. Normal pulmonary vascularity.  IMPRESSION: 1. Endotracheal tube tip in satisfactory position projecting approximately 4 cm above the carina. 2. Nasogastric tube tip in the distal esophagus just above the EG junction. This should be advanced several cm. 3. Borderline heart size. Linear atelectasis or scar in the lingula. No acute cardiopulmonary disease otherwise.   Electronically Signed   By: Evangeline Dakin M.D.   On: 03/07/2013 11:59    Review of Systems  Unable to perform ROS: intubated   Blood pressure 129/84, pulse 104, temperature 94.1 F (34.5 C), temperature source Rectal, resp. rate 18, height _0  (1.753 m), weight 86.183 kg (190 lb), SpO2 100.00%. Physical Exam  Eyes: Conjunctivae are normal. Left eye exhibits no discharge. No scleral icterus.  Neck: Normal range of motion. Neck supple. No JVD present. No tracheal deviation present. No thyromegaly present.  Cardiovascular: Normal rate and regular rhythm.   Murmur (Soft systolic murmur noted no S3 gallop) heard. Respiratory: Effort normal and breath sounds normal. No respiratory distress. He has no wheezes. He has no rales.  GI: Soft. Bowel sounds are normal. He exhibits no distension.  Musculoskeletal:  No clubbing cyanosis trace edema noted  Neurological:  Intubated sedated unresponsive    Assessment/Plan: Status post recurrent V. fib cardiac arrest Coronary artery disease status post CABG Hypertension Diabetes  mellitus Hypercholesteremia Status post seizures Hydrocephalus Plan Check serial enzymes and EKG Start IV amiodarone as per orders Start aspirin beta blockers as per orders Continue home meds Will take patient to Cath Lab emergently for left cath possible PTCA stenting discussed with Dr. Trudi Ida 03/07/2013, 2:30 PM

## 2013-03-07 NOTE — ED Notes (Signed)
Pt with no response from initial bolus.  2nd 10 mg bolus given per minor.

## 2013-03-07 NOTE — Progress Notes (Signed)
  Amiodarone Drug - Drug Interaction Consult Note  Recommendations:  Amiodarone is metabolized by the cytochrome P450 system and therefore has the potential to cause many drug interactions. Amiodarone has an average plasma half-life of 50 days (range 20 to 100 days).   There is potential for drug interactions to occur several weeks or months after stopping treatment and the onset of drug interactions may be slow after initiating amiodarone.   []  Statins: Increased risk of myopathy. Simvastatin- restrict dose to 20mg  daily. Other statins: counsel patients to report any muscle pain or weakness immediately.  []  Anticoagulants: Amiodarone can increase anticoagulant effect. Consider warfarin dose reduction. Patients should be monitored closely and the dose of anticoagulant altered accordingly, remembering that amiodarone levels take several weeks to stabilize.  []  Antiepileptics: Amiodarone can increase plasma concentration of phenytoin, the dose should be reduced. Note that small changes in phenytoin dose can result in large changes in levels. Monitor patient and counsel on signs of toxicity.  [x]  Beta blockers: increased risk of bradycardia, AV block and myocardial depression. Sotalol - avoid concomitant use.  []   Calcium channel blockers (diltiazem and verapamil): increased risk of bradycardia, AV block and myocardial depression.  []   Cyclosporine: Amiodarone increases levels of cyclosporine. Reduced dose of cyclosporine is recommended.  []  Digoxin dose should be halved when amiodarone is started.  []  Diuretics: increased risk of cardiotoxicity if hypokalemia occurs.  []  Oral hypoglycemic agents (glyburide, glipizide, glimepiride): increased risk of hypoglycemia. Patient's glucose levels should be monitored closely when initiating amiodarone therapy.   []  Drugs that prolong the QT interval:  Torsades de pointes risk may be increased with concurrent use - avoid if possible.  Monitor QTc, also  keep magnesium/potassium WNL if concurrent therapy can't be avoided. Marland Kitchen Antibiotics: e.g. fluoroquinolones, erythromycin. . Antiarrhythmics: e.g. quinidine, procainamide, disopyramide, sotalol. . Antipsychotics: e.g. phenothiazines, haloperidol.  . Lithium, tricyclic antidepressants, and methadone.  Thank You,  Nicole Cella, RPh Clinical Pharmacist Pager: [phone] 03/07/2013 9:31 PM

## 2013-03-07 NOTE — Procedures (Signed)
Central Venous Catheter Insertion Procedure Note Rylin Eagan [id number] [birth year]  Procedure: Insertion of Central Venous Catheter Indications: Assessment of intravascular volume, Drug and/or fluid administration and Frequent blood sampling  Procedure Details Consent: Unable to obtain consent because of emergent medical necessity. Time Out: Verified patient identification, verified procedure, site/side was marked, verified correct patient position, special equipment/implants available, medications/allergies/relevent history reviewed, required imaging and test results available.  Performed  Maximum sterile technique was used including antiseptics, cap, gloves, gown, hand hygiene, mask and sheet. Skin prep: Chlorhexidine; local anesthetic administered A antimicrobial bonded/coated triple lumen catheter was placed in the right femoral vein due to emergent situation using the Seldinger technique.  Evaluation Blood flow good Complications: No apparent complications Patient did tolerate procedure well. Chest X-ray ordered to verify placement.  CXR: normal.  Cap Massi J. 03/07/2013, 3:07 PM  Access needed stat coding [name]. Titus Mould, MD, [place] Pgr: [facility]

## 2013-03-07 NOTE — ED Notes (Signed)
Xray called for chest xray 

## 2013-03-07 NOTE — H&P (Signed)
Name: Eric [name] MRN: [id number] DOB: Jul 06, 1955    ADMISSION DATE:  03/07/2013    REFERRING MD :  EDP PRIMARY SERVICE: PCCM  CHIEF COMPLAINT: Post arrest   BRIEF PATIENT DESCRIPTION:  58 yo with witnessed sz at [employer]. Apneic and pulseless at scene. CPR x 5 minutes  , king airway placed by EMS, intubated in ED. Recurrent V fib arrest in ED required defibrillation down 5 min.    SIGNIFICANT EVENTS / STUDIES:  Arrest with sz 2/13 2/13 CT head>>  LINES / TUBES: 2/13 [id number])>> 2/13 IJ CVL>> 2/13 rt fem cvl>> 2/13 rt fem aline>>  CULTURES:   ANTIBIOTICS:   HISTORY OF PRESENT ILLNESS:   58 yo with witnessed sz at [employer]. Apneic and pulseless at scene. CPR x 5 minutes  , king airway placed by EMS, intubated in ED.  Receives his care in [place]. Coded again 5 min vt in ed.  PAST MEDICAL HISTORY :  Past Medical History  Diagnosis Date  . Hypertension   . Hyperlipemia   . 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 Edema    No past surgical history on file. Prior to Admission medications   Not on File   No Known Allergies  FAMILY HISTORY:  No family history on file. SOCIAL HISTORY:  has no tobacco, alcohol, and drug history on file.  REVIEW OF SYSTEMS:  10 point review of system taken, please see HPI for positives and negatives.   SUBJECTIVE: re current fib in ed  VITAL SIGNS: Pulse Rate:  [85-97] 85 (02/13 1145) Resp:  [16-21] 21 (02/13 1145) BP: (164-179)/(95-98) 164/95 mmHg (02/13 1145) SpO2:  [100 %] 100 % (02/13 1145) HEMODYNAMICS:   VENTILATOR SETTINGS:   INTAKE / OUTPUT: Intake/Output   None     PHYSICAL EXAMINATION: General: WNWD male Neuro: Recent NMB, perr 2 mm HEENT:  OTT->vent. No LAN/JVD Cardiovascular:  HSR RRR Lungs:  Coarse bilateral Abdomen: soft +bs Musculoskeletal:  intact Skin:  Warm.   LABS:  CBC  Recent Labs Lab 03/07/13 1136 03/07/13 1152  WBC 6.0  --   HGB 11.8*  12.6*  HCT 33.5* 37.0*  PLT 166  --    Coag's No results found for this basename: APTT, INR,  in the last 168 hours BMET  Recent Labs Lab 03/07/13 1152  NA 141  K 3.8  CL 100  BUN 18  CREATININE 1.00  GLUCOSE 212*   Electrolytes No results found for this basename: CALCIUM, MG, PHOS,  in the last 168 hours Sepsis Markers No results found for this basename: LATICACIDVEN, PROCALCITON, O2SATVEN,  in the last 168 hours ABG No results found for this basename: PHART, PCO2ART, PO2ART,  in the last 168 hours Liver Enzymes No results found for this basename: AST, ALT, ALKPHOS, BILITOT, ALBUMIN,  in the last 168 hours Cardiac Enzymes No results found for this basename: TROPONINI, PROBNP,  in the last 168 hours Glucose  Recent Labs Lab 03/07/13 1124  GLUCAP 191*    Imaging No results found.   pcxr - difffuse int changes, ett wnl, line wnl  ASSESSMENT / PLAN:  PULMONARY A: VDRF post arrest. Vfib arrest IN ED with defib x 1, edema likely, r./o ALI P:   Vent bundle Check Abg stat, likley will require higher MV for concern lactic To goal 50% peep 5 pcxr in am   CARDIOVASCULAR A: Witnessed  Arrest but with Sz. Pulpless at scene. Vs stroke. Repeat  V fib arrest.  R/o ischemia HTN P:  Cards consult much appreciated Hypothermia protocol ifno ICH on ct head May need amio drip , will d/w cards STAT lytes Tele Trop [place] if no ICH Beta blocker after re eval BP with propofol MAP goal 85-105  RENAL A: r/o lactic acidosis P:   Check cmet stat Maintenance volume  GASTROINTESTINAL A:  Gi protection, r/o shock liver P:   PPI lft in am  NGT  HEMATOLOGIC A:  R/o ischemia P: stat head CT, if neg consider heparin infusion As[facility] scd   INFECTIOUS A: Immune suppresion from hypothermia if perfromed P:   Low threshold unasyn BC if spike pcxr follow up UA ENDOCRINE A:  IDDM(cbg 191 on admit) P:   SSI  NEUROLOGIC A:  AMS/SZ/? Neuro event P:   CT head rule out  ICH STAT Cooled if negative to goal 32-34   TODAY'S SUMMARY 58 yo with witnessed sz at [employer]. Apneic and pulseless at scene. CPR x 5 minutes  , king airway placed by EMS, intubated in ED.  Consider cath if ct head neg ICH, control BP, ABG STAT, cards eval, conisder Raechel Ache Minor ACNP Maryanna Shape PCCM Pager [phone] till 3 pm If no answer page [phone] 03/07/2013, 12:02 PM  Ccm time 45 min   I have fully examined this patient and agree with above findings.    And edited infull  Eric [name]. Eric Mould, MD, [place] Pgr: [facility]

## 2013-03-07 NOTE — ED Provider Notes (Signed)
CSN: [id number]     Arrival date & time 03/07/13  1117 History   First MD Initiated Contact with Patient 03/07/13 1150     Chief Complaint  Patient presents with  . post cpr      (Consider location/radiation/quality/duration/timing/severity/associated sxs/prior Treatment) HPI Comments: 58 yo male presented after witnessed arrest at [employer], pt had brief seizure activity then collapsed, CPR on scene for 5- 10 min then ROSC.  No known medical hx however pt has chest scar and htn on previous record.  No family with patient.  Pt was sinus tach for EMS on arrival, [place] airway placed, no purposeful neuro activity on route.   The history is provided by the EMS personnel.    Past Medical History  Diagnosis Date  . Hypertension   . Hyperlipemia   . 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 Edema    No past surgical history on file. No family history on file. History  Substance Use Topics  . Smoking status: Not on file  . Smokeless tobacco: Not on file  . Alcohol Use: Not on file    Review of Systems  Unable to perform ROS: Patient nonverbal      Allergies  Review of patient's allergies indicates no known allergies.  Home Medications  No current outpatient prescriptions on file. BP 168/98  Pulse 86  Resp 19  Ht 5\' 9"  (1.753 m)  Wt 190 lb (86.183 kg)  BMI 28.05 kg/m2  SpO2 100% Physical Exam  Nursing note and vitals reviewed. Constitutional: He appears well-developed and well-nourished.  HENT:  Head: Normocephalic and atraumatic.  Eyes: Right eye exhibits no discharge. Left eye exhibits no discharge.  Neck: Neck supple. No tracheal deviation present.  Cardiovascular: Regular rhythm.  Tachycardia present.   No murmur heard. Pulmonary/Chest:  Agonal breathing  Abdominal: Soft. He exhibits no distension.  Musculoskeletal: He exhibits edema (mild LE bilateral).  Neurological: GCS eye subscore is 1. GCS verbal subscore is 1. GCS motor subscore  is 1.  Not responding Rare non purposeful arm movements with pani  Skin: Skin is warm. No rash noted.    ED Course  Procedures (including critical care time) CRITICAL CARE Performed by: Mariea Clonts  Total critical care time: 75 min  Critical care time was exclusive of separately billable procedures and treating other patients.  Critical care was necessary to treat or prevent imminent or life-threatening deterioration.  Critical care was time spent personally by me on the following activities: development of treatment plan with patient and/or surrogate as well as nursing, discussions with consultants, evaluation of patient's response to treatment, examination of patient, obtaining history from patient or surrogate, ordering and performing treatments and interventions, ordering and review of laboratory studies, ordering and review of radiographic studies, pulse oximetry and re-evaluation of patient's condition.    EMERGENCY DEPARTMENT [place] CARDIAC EXAM "Study: Limited Ultrasound of the heart and pericardium"  INDICATIONS:Tachycardia and Cardiac arrest Multiple views of the heart and pericardium were obtained in real-time with a multi-frequency probe.  PERFORMED [id number]  IMAGES ARCHIVED?: Yes  FINDINGS: No pericardial effusion, Decreased contractility and Tamponade physiology absent  LIMITATIONS:  Emergent procedure  VIEWS USED: Subcostal 4 chamber, Parasternal long axis and Parasternal short axis  INTERPRETATION: Cardiac activity present, Pericardial effusioin absent and Decreased contractility  INTUBATION Performed by: Mariea Clonts  Required items: required blood products, implants, devices, and special equipment available Patient identity confirmed: provided demographic data and hospital-assigned identification number  Time out: Immediately prior to procedure a "time out" was called to verify the correct patient, procedure, equipment, support staff.  Indications:  arrest Intubation method: direct Preoxygenation: BVM Sedatives: Etomidate Paralytic: Rocuronium Tube Size: cuffed  Post-procedure assessment: chest rise and ETCO2 monitor Breath sounds: equal and absent over the epigastrium Tube secured with: ETT holder Chest x-ray interpreted by me.  Chest x-ray findings: 8-0endotracheal tube in appropriate position  Patient tolerated the procedure well with no immediate complications.   Labs Review Labs Reviewed  GLUCOSE, CAPILLARY - Abnormal; Notable for the following:    Glucose-Capillary 191 (*)    All other components within normal limits  CBC - Abnormal; Notable for the following:    RBC 3.79 (*)    Hemoglobin 11.8 (*)    HCT 33.5 (*)    All other components within normal limits  COMPREHENSIVE METABOLIC PANEL - Abnormal; Notable for the following:    Glucose, Bld 206 (*)    Albumin 2.6 (*)    AST 49 (*)    ALT 59 (*)    All other components within normal limits  POCT I-STAT, CHEM 8 - Abnormal; Notable for the following:    Glucose, Bld 212 (*)    Hemoglobin 12.6 (*)    HCT 37.0 (*)    All other components within normal limits  CULTURE, BLOOD (ROUTINE X 2)  CULTURE, BLOOD (ROUTINE X 2)  URINE CULTURE  PROTIME-INR  APTT  DIFFERENTIAL  TROPONIN I  URINALYSIS, ROUTINE W REFLEX MICROSCOPIC  COMPREHENSIVE METABOLIC PANEL  MAGNESIUM  PHOSPHORUS  LACTIC ACID, PLASMA  PROCALCITONIN  CORTISOL  CBC  PROTIME-INR  APTT  URINALYSIS, ROUTINE W REFLEX MICROSCOPIC  STREP PNEUMONIAE URINARY ANTIGEN  LEGIONELLA ANTIGEN, URINE  BLOOD GAS, ARTERIAL  URINE RAPID DRUG SCREEN (HOSP PERFORMED)  HEMOGLOBIN A1C  POCT I-STAT TROPONIN I   Imaging Review Dg Chest Portable 1 View  03/07/2013   CLINICAL DATA:  Intubation.  EXAM: PORTABLE CHEST - 1 VIEW  COMPARISON:  None.  FINDINGS: Endotracheal tube tip in satisfactory position projecting approximately 4 cm above the carina. Nasogastric tube tip in the distal esophagus just above the EG  junction. Prior sternotomy for CABG. Cardiac silhouette upper normal in size to slightly enlarged for AP portable technique. Linear atelectasis or scar in the lingula. Lungs otherwise clear. No localized airspace consolidation. No pleural effusions. No pneumothorax. Normal pulmonary vascularity.  IMPRESSION: 1. Endotracheal tube tip in satisfactory position projecting approximately 4 cm above the carina. 2. Nasogastric tube tip in the distal esophagus just above the EG junction. This should be advanced several cm. 3. Borderline heart size. Linear atelectasis or scar in the lingula. No acute cardiopulmonary disease otherwise.   Electronically Signed   By: Evangeline Dakin M.D.   On: 03/07/2013 11:59    EKG Interpretation    Date/Time:  Friday March 07 2013 11:23:28 EST Ventricular Rate:  100 PR Interval:  145 QRS Duration: 85 QT Interval:  361 QTC Calculation: 466 R Axis:   54 Text Interpretation:  Sinus tachycardia Left atrial enlargement Probable anteroseptal infarct, old Borderline ST depression, inferior leads Confirmed by Ziya Coonrod  MD, Jaziyah Gradel ([id number]) on 03/07/2013 12:38:13 PM            MDM   Final diagnoses:  Cardiac arrest  Acute respiratory failure with hypoxia  Seizure  Ventricular tachycardia     Cardiac arrest - concern for cardiac cause vs less likely seizure/ head bleed   Pt had pulse on arrival, htn, CT  head ordered stat.  Pt intubated for airway protection and respirations. King removed, one attempt.  Initial EKG ST depression, no stemi, reviewed.  Multiple rechecks, family not in the ED.   Pt became tachycardic then lost pulse. Vtach on the monitor. CPR initiated. Cardiopulmonary Resuscitation (CPR) Procedure Note Directed/Performed by: Mariea Clonts I personally directed ancillary staff and/or performed CPR in an effort to regain return of spontaneous circulation and to maintain cardiac, neuro and systemic perfusion.   SHOCK CPR initiated, pt in V  tach on monitor.  Shock ordered by myself 200 J given, pt returned in NSR.  Repeat EKG no stemi, paged interventional cardiology to discuss cath lab after CT head. Discussed with cardiology interventional and Critical care, plan to take to cath lab shortly after CT head neg.  Critical care at bedside placing art and central line.    The patients results and plan were reviewed and discussed.   Any x-rays performed were personally reviewed by myself.   Differential diagnosis were considered with the presenting HPI.  Diagnosis: cardiac arrest, v tach  EKG: reviewed  Admission/ observation were discussed with the admitting physician, patient and/or family and they are comfortable with the plan.     Mariea Clonts, MD 03/07/13 [phone]

## 2013-03-07 NOTE — Consult Note (Addendum)
NEURO HOSPITALIST CONSULT NOTE    Reason for Consult: seizure, hydrocephalus  HPI:                                                                                                                                          Arsen Mangione is an 58 y.o. male with a past medical history significant for HTN, DM, hyperlipidemia, coronary artery disease status post CABG, brought to [facility] ED today after sustaining a witnessed seizure at [employer] followed by cardiac arrest requiring CPR x 5 minutes and intubation on the field. Upon arrival to the ED he had recurrent episodes of V. fib arrest necessitating defibrillation. EKG done in the ER showed normal sinus rhythm with septal Q waves with no acute ischemic changes. He underwent emergent cardiac cath. He had an urgent CT brain in the ED that shoed no acute abnormality but marked dilatation of the third ventricle and lateral ventricles consistent with probable obstructive hydrocephalus. The IV ventricle is patent. No family is available and the patient is intubated and paralyzed and thus I can not gather further clinical information at this moment. Nursing staff report no further seizures or myoclonic activity.  Past Medical History  Diagnosis Date  . Hypertension   . Hyperlipemia   . 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 Edema     No past surgical history on file.  No family history on file.   Social History:  has no tobacco, alcohol, and drug history on file.  No Known Allergies  MEDICATIONS:                                                                                                                     I have reviewed the patient's current medications.   ROS: unable to obtain due to mental status.  History obtained from chart review.  Physical exam: paralyzed and intubated  on the vent. Blood pressure 195/97, pulse 68, temperature 97.5 F (36.4 C), temperature source Core (Comment), resp. rate 10, height 6' (1.829 m), weight 77.2 kg (170 lb 3.1 oz), SpO2 100.00%. Head: normocephalic. Neck: supple, no bruits, no JVD. Cardiac: no murmurs. Lungs: clear. Abdomen: soft, no tender, no mass. Extremities: no edema.  Neurologic Examination:  Limited due to patient sedated and paralyzed.                                                                    Mental status: sedated, paralyzed on the vent. CN 2-12: pupils 3-4 mm, poorly reactive. Couldn't elicit EOM on Doll's maneuver. Doesn't blink to threat but patient paralyzed. Motor: paralyzed. Sensory: unreliable due to sedation/paralyzed. DTRs: patient paralyzed. Coordination and gait: unable to test.  No results found for this basename: cbc, bmp, coags, chol, tri, ldl, hga1c    Results for orders placed during the hospital encounter of 03/07/13 (from the past 48 hour(s))  GLUCOSE, CAPILLARY     Status: Abnormal   Collection Time    03/07/13 11:24 AM      Result Value Ref Range   Glucose-Capillary 191 (*) 70 - 99 mg/dL   Comment 1 Documented in Chart     Comment 2 Notify RN    PROTIME-INR     Status: None   Collection Time    03/07/13 11:36 AM      Result Value Ref Range   Prothrombin Time 15.0  11.6 - 15.2 seconds   INR 1.21  0.00 - 1.49  APTT     Status: None   Collection Time    03/07/13 11:36 AM      Result Value Ref Range   aPTT 31  24 - 37 seconds  CBC     Status: Abnormal   Collection Time    03/07/13 11:36 AM      Result Value Ref Range   WBC 6.0  4.0 - 10.5 K/uL   RBC 3.79 (*) 4.22 - 5.81 MIL/uL   Hemoglobin 11.8 (*) 13.0 - 17.0 g/dL   HCT 33.5 (*) 39.0 - 52.0 %   MCV 88.4  78.0 - 100.0 fL   MCH 31.1  26.0 - 34.0 pg   MCHC 35.2  30.0 - 36.0 g/dL   RDW 12.9  11.5 - 15.5 %   Platelets 166  150 - 400 K/uL  DIFFERENTIAL     Status: None   Collection Time    03/07/13 11:36 AM      Result  Value Ref Range   Neutrophils Relative % 52  43 - 77 %   Neutro Abs 3.1  1.7 - 7.7 K/uL   Lymphocytes Relative 39  12 - 46 %   Lymphs Abs 2.3  0.7 - 4.0 K/uL   Monocytes Relative 7  3 - 12 %   Monocytes Absolute 0.4  0.1 - 1.0 K/uL   Eosinophils Relative 2  0 - 5 %   Eosinophils Absolute 0.1  0.0 - 0.7 K/uL   Basophils Relative 0  0 - 1 %   Basophils Absolute 0.0  0.0 - 0.1 K/uL  COMPREHENSIVE METABOLIC PANEL  Status: Abnormal   Collection Time    03/07/13 11:36 AM      Result Value Ref Range   Sodium 139  137 - 147 mEq/L   Potassium 4.0  3.7 - 5.3 mEq/L   Chloride 102  96 - 112 mEq/L   CO2 22  19 - 32 mEq/L   Glucose, Bld 206 (*) 70 - 99 mg/dL   BUN 17  6 - 23 mg/dL   Creatinine, Ser 0.91  0.50 - 1.35 mg/dL   Calcium 8.8  8.4 - 10.5 mg/dL   Total Protein 8.2  6.0 - 8.3 g/dL   Albumin 2.6 (*) 3.5 - 5.2 g/dL   AST 49 (*) 0 - 37 U/L   ALT 59 (*) 0 - 53 U/L   Alkaline Phosphatase 61  39 - 117 U/L   Total Bilirubin 0.3  0.3 - 1.2 mg/dL   GFR calc non Af Amer >90  >90 mL/min   GFR calc Af Amer >90  >90 mL/min   Comment: (NOTE)     The eGFR has been calculated using the CKD EPI equation.     This calculation has not been validated in all clinical situations.     eGFR's persistently <90 mL/min signify possible Chronic Kidney     Disease.  TROPONIN I     Status: None   Collection Time    03/07/13 11:36 AM      Result Value Ref Range   Troponin I <0.30  <0.30 ng/mL   Comment:            Due to the release kinetics of cTnI,     a negative result within the first hours     of the onset of symptoms does not rule out     myocardial infarction with certainty.     If myocardial infarction is still suspected,     repeat the test at appropriate intervals.  POCT I-STAT TROPONIN I     Status: None   Collection Time    03/07/13 11:51 AM      Result Value Ref Range   Troponin i, poc 0.02  0.00 - 0.08 ng/mL   Comment 3            Comment: Due to the release kinetics of cTnI,     a  negative result within the first hours     of the onset of symptoms does not rule out     myocardial infarction with certainty.     If myocardial infarction is still suspected,     repeat the test at appropriate intervals.  POCT I-STAT, CHEM 8     Status: Abnormal   Collection Time    03/07/13 11:52 AM      Result Value Ref Range   Sodium 141  137 - 147 mEq/L   Potassium 3.8  3.7 - 5.3 mEq/L   Chloride 100  96 - 112 mEq/L   BUN 18  6 - 23 mg/dL   Creatinine, Ser 1.00  0.50 - 1.35 mg/dL   Glucose, Bld 212 (*) 70 - 99 mg/dL   Calcium, Ion 1.19  1.12 - 1.23 mmol/L   TCO2 24  0 - 100 mmol/L   Hemoglobin 12.6 (*) 13.0 - 17.0 g/dL   HCT 37.0 (*) 39.0 - 52.0 %  COMPREHENSIVE METABOLIC PANEL     Status: Abnormal   Collection Time    03/07/13 12:19 PM      Result Value Ref Range  Sodium 136 (*) 137 - 147 mEq/L   Potassium 3.9  3.7 - 5.3 mEq/L   Chloride 99  96 - 112 mEq/L   CO2 21  19 - 32 mEq/L   Glucose, Bld 229 (*) 70 - 99 mg/dL   BUN 19  6 - 23 mg/dL   Creatinine, Ser 1.06  0.50 - 1.35 mg/dL   Calcium 8.5  8.4 - 10.5 mg/dL   Total Protein 7.6  6.0 - 8.3 g/dL   Albumin 2.3 (*) 3.5 - 5.2 g/dL   AST 45 (*) 0 - 37 U/L   ALT 54 (*) 0 - 53 U/L   Alkaline Phosphatase 59  39 - 117 U/L   Total Bilirubin 0.4  0.3 - 1.2 mg/dL   GFR calc non Af Amer 75 (*) >90 mL/min   GFR calc Af Amer 87 (*) >90 mL/min   Comment: (NOTE)     The eGFR has been calculated using the CKD EPI equation.     This calculation has not been validated in all clinical situations.     eGFR's persistently <90 mL/min signify possible Chronic Kidney     Disease.  MAGNESIUM     Status: None   Collection Time    03/07/13 12:19 PM      Result Value Ref Range   Magnesium 1.5  1.5 - 2.5 mg/dL  PHOSPHORUS     Status: None   Collection Time    03/07/13 12:19 PM      Result Value Ref Range   Phosphorus 3.8  2.3 - 4.6 mg/dL  LACTIC ACID, PLASMA     Status: Abnormal   Collection Time    03/07/13 12:19 PM      Result  Value Ref Range   Lactic Acid, Venous 2.7 (*) 0.5 - 2.2 mmol/L  PROCALCITONIN     Status: None   Collection Time    03/07/13 12:19 PM      Result Value Ref Range   Procalcitonin <0.10     Comment:            Interpretation:     PCT (Procalcitonin) <= 0.5 ng/mL:     Systemic infection (sepsis) is not likely.     Local bacterial infection is possible.     (NOTE)             ICU PCT Algorithm               Non ICU PCT Algorithm        ----------------------------     ------------------------------             PCT < 0.25 ng/mL                 PCT < 0.1 ng/mL         Stopping of antibiotics            Stopping of antibiotics           strongly encouraged.               strongly encouraged.        ----------------------------     ------------------------------           PCT level decrease by               PCT < 0.25 ng/mL           >= 80% from peak PCT           OR PCT  0.25 - 0.5 ng/mL          Stopping of antibiotics                                                 encouraged.         Stopping of antibiotics               encouraged.        ----------------------------     ------------------------------           PCT level decrease by              PCT >= 0.25 ng/mL           < 80% from peak PCT            AND PCT >= 0.5 ng/mL            Continuing antibiotics                                                  encouraged.           Continuing antibiotics                encouraged.        ----------------------------     ------------------------------         PCT level increase compared          PCT > 0.5 ng/mL             with peak PCT AND              PCT >= 0.5 ng/mL             Escalation of antibiotics                                              strongly encouraged.          Escalation of antibiotics            strongly encouraged.  CBC     Status: Abnormal   Collection Time    03/07/13 12:19 PM      Result Value Ref Range   WBC 6.4  4.0 - 10.5 K/uL   RBC 3.84 (*) 4.22 - 5.81  MIL/uL   Hemoglobin 12.1 (*) 13.0 - 17.0 g/dL   HCT 33.9 (*) 39.0 - 52.0 %   MCV 88.3  78.0 - 100.0 fL   MCH 31.5  26.0 - 34.0 pg   MCHC 35.7  30.0 - 36.0 g/dL   RDW 12.9  11.5 - 15.5 %   Platelets 208  150 - 400 K/uL  PROTIME-INR     Status: Abnormal   Collection Time    03/07/13 12:19 PM      Result Value Ref Range   Prothrombin Time 15.7 (*) 11.6 - 15.2 seconds   INR 1.28  0.00 - 1.49  APTT     Status: None   Collection Time    03/07/13 12:19 PM      Result Value Ref Range   aPTT 31  24 - 37 seconds  URINALYSIS, ROUTINE W REFLEX MICROSCOPIC     Status: Abnormal   Collection Time    03/07/13 12:19 PM      Result Value Ref Range   Color, Urine YELLOW  YELLOW   APPearance HAZY (*) CLEAR   Specific Gravity, Urine 1.017  1.005 - 1.030   pH 7.0  5.0 - 8.0   Glucose, UA NEGATIVE  NEGATIVE mg/dL   Hgb urine dipstick MODERATE (*) NEGATIVE   Bilirubin Urine NEGATIVE  NEGATIVE   Ketones, ur NEGATIVE  NEGATIVE mg/dL   Protein, ur >300 (*) NEGATIVE mg/dL   Urobilinogen, UA 2.0 (*) 0.0 - 1.0 mg/dL   Nitrite NEGATIVE  NEGATIVE   Leukocytes, UA NEGATIVE  NEGATIVE  HEMOGLOBIN A1C     Status: Abnormal   Collection Time    03/07/13 12:19 PM      Result Value Ref Range   Hemoglobin A1C 6.3 (*) <5.7 %   Comment: (NOTE)                                                                               According to the ADA Clinical Practice Recommendations for 2011, when     HbA1c is used as a screening test:      >=6.5%   Diagnostic of Diabetes Mellitus               (if abnormal result is confirmed)     5.7-6.4%   Increased risk of developing Diabetes Mellitus     References:Diagnosis and Classification of Diabetes Mellitus,Diabetes     CHEN,2778,24(MPNTI 1):S62-S69 and Standards of Medical Care in             Diabetes - 2011,Diabetes Care,2011,34 (Suppl 1):S11-S61.   Mean Plasma Glucose 134 (*) <117 mg/dL   Comment: Performed at [facility]     Status:  Abnormal   Collection Time    03/07/13 12:19 PM      Result Value Ref Range   Squamous Epithelial / LPF FEW (*) RARE   WBC, UA 0-2  <3 WBC/hpf   RBC / HPF 3-6  <3 RBC/hpf   Bacteria, UA FEW (*) RARE   Urine-Other SPERM PRESENT    URINE RAPID DRUG SCREEN (HOSP PERFORMED)     Status: None   Collection Time    03/07/13 12:24 PM      Result Value Ref Range   Opiates NONE DETECTED  NONE DETECTED   Cocaine NONE DETECTED  NONE DETECTED   Benzodiazepines NONE DETECTED  NONE DETECTED   Amphetamines NONE DETECTED  NONE DETECTED   Tetrahydrocannabinol NONE DETECTED  NONE DETECTED   Barbiturates NONE DETECTED  NONE DETECTED   Comment:            DRUG SCREEN FOR MEDICAL PURPOSES     ONLY.  IF CONFIRMATION IS NEEDED     FOR ANY PURPOSE, NOTIFY LAB     WITHIN 5 DAYS.                LOWEST DETECTABLE LIMITS     FOR URINE DRUG SCREEN     Drug Class  Cutoff (ng/mL)     Amphetamine      1000     Barbiturate      200     Benzodiazepine   465     Tricyclics       035     Opiates          300     Cocaine          300     THC              50  TRIGLYCERIDES     Status: None   Collection Time    03/07/13 12:48 PM      Result Value Ref Range   Triglycerides 124  <150 mg/dL  POCT I-STAT 3, BLOOD GAS (G3+)     Status: Abnormal   Collection Time    03/07/13  1:02 PM      Result Value Ref Range   pH, Arterial 7.380  7.350 - 7.450   pCO2 arterial 46.2 (*) 35.0 - 45.0 mmHg   pO2, Arterial 236.0 (*) 80.0 - 100.0 mmHg   Bicarbonate 27.4 (*) 20.0 - 24.0 mEq/L   TCO2 29  0 - 100 mmol/L   O2 Saturation 100.0     Acid-Base Excess 2.0  0.0 - 2.0 mmol/L   Patient temperature 98.6 F     Collection site ARTERIAL LINE     Drawn by Operator     Sample type ARTERIAL    MRSA PCR SCREENING     Status: None   Collection Time    03/07/13  2:35 PM      Result Value Ref Range   MRSA by PCR NEGATIVE  NEGATIVE   Comment:            The GeneXpert MRSA Assay (FDA     approved for NASAL specimens      only), is one component of a     comprehensive MRSA colonization     surveillance program. It is not     intended to diagnose MRSA     infection nor to guide or     monitor treatment for     MRSA infections.  POCT I-STAT 3, BLOOD GAS (G3+)     Status: Abnormal   Collection Time    03/07/13  3:33 PM      Result Value Ref Range   pH, Arterial 7.261 (*) 7.350 - 7.450   pCO2 arterial 42.4  35.0 - 45.0 mmHg   pO2, Arterial 74.0 (*) 80.0 - 100.0 mmHg   Bicarbonate 19.1 (*) 20.0 - 24.0 mEq/L   TCO2 20  0 - 100 mmol/L   O2 Saturation 92.0     Acid-base deficit 8.0 (*) 0.0 - 2.0 mmol/L   Sample type ARTERIAL    POCT ACTIVATED CLOTTING TIME     Status: None   Collection Time    03/07/13  4:05 PM      Result Value Ref Range   Activated Clotting Time 144    POCT I-STAT 3, BLOOD GAS (G3+)     Status: Abnormal   Collection Time    03/07/13  5:05 PM      Result Value Ref Range   pH, Arterial 7.408  7.350 - 7.450   pCO2 arterial 43.1  35.0 - 45.0 mmHg   pO2, Arterial 282.0 (*) 80.0 - 100.0 mmHg   Bicarbonate 27.2 (*) 20.0 - 24.0 mEq/L   TCO2 28  0 - 100 mmol/L  O2 Saturation 100.0     Acid-Base Excess 2.0  0.0 - 2.0 mmol/L   Patient temperature 98.7 F     Collection site RADIAL, ALLEN'S TEST ACCEPTABLE     Drawn by VENIPUNCTURE     Sample type ARTERIAL    BASIC METABOLIC PANEL     Status: Abnormal   Collection Time    03/07/13  5:22 PM      Result Value Ref Range   Sodium 137  137 - 147 mEq/L   Potassium 4.8  3.7 - 5.3 mEq/L   Chloride 99  96 - 112 mEq/L   CO2 24  19 - 32 mEq/L   Glucose, Bld 196 (*) 70 - 99 mg/dL   BUN 22  6 - 23 mg/dL   Creatinine, Ser 1.16  0.50 - 1.35 mg/dL   Calcium 8.0 (*) 8.4 - 10.5 mg/dL   GFR calc non Af Amer 67 (*) >90 mL/min   GFR calc Af Amer 78 (*) >90 mL/min   Comment: (NOTE)     The eGFR has been calculated using the CKD EPI equation.     This calculation has not been validated in all clinical situations.     eGFR's persistently <90 mL/min  signify possible Chronic Kidney     Disease.  PROTIME-INR     Status: Abnormal   Collection Time    03/07/13  8:00 PM      Result Value Ref Range   Prothrombin Time 16.1 (*) 11.6 - 15.2 seconds   INR 1.32  0.00 - 3.53  BASIC METABOLIC PANEL     Status: Abnormal   Collection Time    03/07/13  8:00 PM      Result Value Ref Range   Sodium 138  137 - 147 mEq/L   Potassium 4.1  3.7 - 5.3 mEq/L   Chloride 102  96 - 112 mEq/L   CO2 24  19 - 32 mEq/L   Glucose, Bld 295 (*) 70 - 99 mg/dL   BUN 23  6 - 23 mg/dL   Creatinine, Ser 1.06  0.50 - 1.35 mg/dL   Calcium 8.2 (*) 8.4 - 10.5 mg/dL   GFR calc non Af Amer 75 (*) >90 mL/min   GFR calc Af Amer 87 (*) >90 mL/min   Comment: (NOTE)     The eGFR has been calculated using the CKD EPI equation.     This calculation has not been validated in all clinical situations.     eGFR's persistently <90 mL/min signify possible Chronic Kidney     Disease.    Ct Head (brain) Wo Contrast  03/07/2013   CLINICAL DATA:  Weakness seizure, became pulseless and apneic, post CPR, history hypertension, diabetes  EXAM: CT HEAD WITHOUT CONTRAST  CT CERVICAL SPINE WITHOUT CONTRAST  TECHNIQUE: Multidetector CT imaging of the head and cervical spine was performed following the standard protocol without intravenous contrast. Multiplanar CT image reconstructions of the cervical spine were also generated.  COMPARISON:  None  FINDINGS: CT HEAD FINDINGS  Motion artifacts, for which repeat imaging was performed.  Diffuse dilatation of the lateral and third ventricles compatible of hydrocephalus.  Fourth rectal decompressed.  Scattered fall seen calcification.  No midline shift or mass effect.  Otherwise normal appearance of brain parenchyma.  No intracranial hemorrhage, mass lesion or evidence acute infarction.  No extra-axial fluid collections.  Beam hardening artifacts of dental origin.  Bones and sinuses unremarkable.  CT CERVICAL SPINE FINDINGS  Visualized skullbase intact.  Osseous mineralization normal.  Endotracheal and nasogastric tubes, with nasogastric tube coiled in pharynx.  Question infiltrate right apex.  Vertebral body and disc space heights maintained.  Prevertebral soft tissues normal thickness.  No acute fracture, subluxation or bone destruction.  IMPRESSION: Hydrocephalus, with diffuse dilatation of the lateral and third ventricles.  Lack of fourth ventricular dilatation raises question of aquaductal stenosis; consider followup MR imaging of the brain with and without contrast to assess.  No other intracranial abnormalities.  No acute cervical spine abnormalities.  Nasogastric tube coiled in pharynx - this finding was called to [facility] RN 2-Heart at 1418 hr on 03/07/2013.  Question right upper lobe infiltrate.   Electronically Signed   By: Lavonia Dana M.D.   On: 03/07/2013 14:19   Ct Cervical Spine Wo Contrast  03/07/2013   CLINICAL DATA:  Weakness seizure, became pulseless and apneic, post CPR, history hypertension, diabetes  EXAM: CT HEAD WITHOUT CONTRAST  CT CERVICAL SPINE WITHOUT CONTRAST  TECHNIQUE: Multidetector CT imaging of the head and cervical spine was performed following the standard protocol without intravenous contrast. Multiplanar CT image reconstructions of the cervical spine were also generated.  COMPARISON:  None  FINDINGS: CT HEAD FINDINGS  Motion artifacts, for which repeat imaging was performed.  Diffuse dilatation of the lateral and third ventricles compatible of hydrocephalus.  Fourth rectal decompressed.  Scattered fall seen calcification.  No midline shift or mass effect.  Otherwise normal appearance of brain parenchyma.  No intracranial hemorrhage, mass lesion or evidence acute infarction.  No extra-axial fluid collections.  Beam hardening artifacts of dental origin.  Bones and sinuses unremarkable.  CT CERVICAL SPINE FINDINGS  Visualized skullbase intact.  Osseous mineralization normal.  Endotracheal and nasogastric tubes, with nasogastric  tube coiled in pharynx.  Question infiltrate right apex.  Vertebral body and disc space heights maintained.  Prevertebral soft tissues normal thickness.  No acute fracture, subluxation or bone destruction.  IMPRESSION: Hydrocephalus, with diffuse dilatation of the lateral and third ventricles.  Lack of fourth ventricular dilatation raises question of aquaductal stenosis; consider followup MR imaging of the brain with and without contrast to assess.  No other intracranial abnormalities.  No acute cervical spine abnormalities.  Nasogastric tube coiled in pharynx - this finding was called to [facility] RN 2-Heart at 1418 hr on 03/07/2013.  Question right upper lobe infiltrate.   Electronically Signed   By: Lavonia Dana M.D.   On: 03/07/2013 14:19   Dg Chest Port 1 View  03/07/2013   CLINICAL DATA:  Cardiac arrest.  Central line placement.  EXAM: PORTABLE CHEST - 1 VIEW  COMPARISON:  03/07/2013  FINDINGS: Endotracheal tube and NG tube are in good position. Left jugular vein catheter is been inserted the tip is at the cavoatrial junction in good position. No pneumothorax. Left lung remains clear. There is slight haziness in the right perihilar region.  IMPRESSION: 1. Central venous catheter in good position. 2. New slight haziness in the right perihilar region. The possibility of aspiration pneumonitis should be considered.   Electronically Signed   By: Rozetta Nunnery M.D.   On: 03/07/2013 13:12   Dg Chest Portable 1 View  03/07/2013   CLINICAL DATA:  Intubation.  EXAM: PORTABLE CHEST - 1 VIEW  COMPARISON:  None.  FINDINGS: Endotracheal tube tip in satisfactory position projecting approximately 4 cm above the carina. Nasogastric tube tip in the distal esophagus just above the EG junction. Prior sternotomy for CABG. Cardiac silhouette upper normal in size to slightly  enlarged for AP portable technique. Linear atelectasis or scar in the lingula. Lungs otherwise clear. No localized airspace consolidation. No pleural  effusions. No pneumothorax. Normal pulmonary vascularity.  IMPRESSION: 1. Endotracheal tube tip in satisfactory position projecting approximately 4 cm above the carina. 2. Nasogastric tube tip in the distal esophagus just above the EG junction. This should be advanced several cm. 3. Borderline heart size. Linear atelectasis or scar in the lingula. No acute cardiopulmonary disease otherwise.   Electronically Signed   By: Evangeline Dakin M.D.   On: 03/07/2013 11:59   Dg Abd Portable 1v  03/07/2013   CLINICAL DATA:  Nasogastric tube placement  EXAM: PORTABLE ABDOMEN - 1 VIEW  COMPARISON:  Chest x-ray 03/07/2013  FINDINGS: A nasogastric tube is present. The tip and proximal side hole are noted within the gastric fundus. No evidence of bowel obstruction. Contrast material is noted in the right renal collecting system. Mild cardiomegaly and prior median sternotomy are noted. No free air. A right femoral approach central venous catheter is present. The catheter tip projects over the L5 vertebral body likely within the common iliac vein.  IMPRESSION: The nasogastric tube is well positioned in the gastric fundus.  The tip of a right femoral approach central venous catheter projects over the L5 pedicle likely in the common iliac vein.   Electronically Signed   By: Jacqulynn Cadet M.D.   On: 03/07/2013 18:44   Assessment/Plan: 58 y/o status post witnessed seizure followed by cardiac arrest with recurrent v fib arrest. Patient paralyzed and intubated and thus unreliable neuro-exam. CT brain with marked dilatation third and lateral ventricles suggestive of obstructive hydrocephalus. No mass effect. Unclear if this is acute hydrocephalus with increased ICP ( however, CT brain without evidence of mass effect) causing seizures and then a cardiac event, or perhaps a primary cardiac issue leading to seizures with an incidental finding of chronic obstructive hydrocephalus. Recommend MRI brain with and without contrast as  soon as clinically stable for MRI. EEG already requested. Will follow up.   Dorian Pod, MD 03/07/2013, 9:45 PM

## 2013-03-07 NOTE — Progress Notes (Signed)
Chaplain responded to CPR. No family present.

## 2013-03-07 NOTE — Procedures (Signed)
Central Venous Catheter Insertion Procedure Note Eric [name] [id number] 1955/05/07  Procedure: Insertion of Central Venous Catheter Indications: Assessment of intravascular volume, Drug and/or fluid administration and Frequent blood sampling  Procedure Details Consent: Unable to obtain consent because of emergent medical necessity. Time Out: Verified patient identification, verified procedure, site/side was marked, verified correct patient position, special equipment/implants available, medications/allergies/relevent history reviewed, required imaging and test results available.  Performed  Maximum sterile technique was used including antiseptics, cap, gloves, gown, hand hygiene, mask and sheet. Skin prep: Chlorhexidine; local anesthetic administered A antimicrobial bonded/coated triple lumen catheter was placed in the left internal jugular vein using the Seldinger technique.  Evaluation Blood flow good Complications: No apparent complications Patient did tolerate procedure well. Chest X-ray ordered to verify placement.  CXR: normal.  Eric [name]. 03/07/2013, 3:06 PM  Eric [name]. Titus Mould, MD, Eric [name] Pgr: [name]

## 2013-03-07 NOTE — CV Procedure (Signed)
Left artery cath report dictated on 03/07/2013 dictation number is [id number]

## 2013-03-07 NOTE — ED Notes (Addendum)
PT was witnessed seizure at [employer].  Became pulseless and apneic, face was blue, no carotid or apical pulse felt.  Fire performed CPR for 5 minutes.  King airway placed.  Pt with purposeless movement pupils 4, equal and sluggish. CBG 168.

## 2013-03-07 NOTE — Progress Notes (Signed)
RT Note: Pt transported to cath lab with no complications. RT to monitor.

## 2013-03-08 ENCOUNTER — Inpatient Hospital Stay (HOSPITAL_COMMUNITY): Payer: Medicaid Other

## 2013-03-08 DIAGNOSIS — I4901 Ventricular fibrillation: Principal | ICD-10-CM

## 2013-03-08 LAB — BLOOD GAS, ARTERIAL
Acid-base deficit: 0.9 mmol/L (ref 0.0–2.0)
Bicarbonate: 23.8 mEq/L (ref 20.0–24.0)
Drawn by: 24513
FIO2: 60 %
MECHVT: 700 mL
O2 SAT: 99.6 %
PEEP/CPAP: 5 cmH2O
PH ART: 7.407 (ref 7.350–7.450)
PO2 ART: 180 mmHg — AB (ref 80.0–100.0)
Patient temperature: 92.3
RATE: 10 resp/min
TCO2: 25.2 mmol/L (ref 0–100)
pCO2 arterial: 36.8 mmHg (ref 35.0–45.0)

## 2013-03-08 LAB — BASIC METABOLIC PANEL
BUN: 22 mg/dL (ref 6–23)
BUN: 22 mg/dL (ref 6–23)
BUN: 22 mg/dL (ref 6–23)
BUN: 22 mg/dL (ref 6–23)
BUN: 22 mg/dL (ref 6–23)
BUN: 20 mg/dL (ref 6–23)
CALCIUM: 8.2 mg/dL — AB (ref 8.4–10.5)
CALCIUM: 8 mg/dL — AB (ref 8.4–10.5)
CALCIUM: 7.9 mg/dL — AB (ref 8.4–10.5)
CHLORIDE: 102 meq/L (ref 96–112)
CO2: 22 mEq/L (ref 19–32)
CO2: 22 mEq/L (ref 19–32)
CO2: 22 meq/L (ref 19–32)
CO2: 23 mEq/L (ref 19–32)
CO2: 23 mEq/L (ref 19–32)
CO2: 23 meq/L (ref 19–32)
Calcium: 7.9 mg/dL — ABNORMAL LOW (ref 8.4–10.5)
Calcium: 8 mg/dL — ABNORMAL LOW (ref 8.4–10.5)
Calcium: 8.2 mg/dL — ABNORMAL LOW (ref 8.4–10.5)
Chloride: 105 mEq/L (ref 96–112)
Chloride: 107 mEq/L (ref 96–112)
Chloride: 105 mEq/L (ref 96–112)
Chloride: 105 mEq/L (ref 96–112)
Chloride: 105 mEq/L (ref 96–112)
Creatinine, Ser: 0.91 mg/dL (ref 0.50–1.35)
Creatinine, Ser: 0.94 mg/dL (ref 0.50–1.35)
Creatinine, Ser: 0.95 mg/dL (ref 0.50–1.35)
Creatinine, Ser: 0.98 mg/dL (ref 0.50–1.35)
Creatinine, Ser: 1.01 mg/dL (ref 0.50–1.35)
Creatinine, Ser: 1.06 mg/dL (ref 0.50–1.35)
GFR calc Af Amer: >90 mL/min (ref >90)
GFR calc non Af Amer: 88 mL/min — ABNORMAL LOW (ref >90)
GFR calc non Af Amer: 89 mL/min — ABNORMAL LOW (ref >90)
GFR calc non Af Amer: >90 mL/min (ref >90)
GFR, EST AFRICAN AMERICAN: 87 mL/min — AB (ref >90)
GFR, EST NON AFRICAN AMERICAN: 90 mL/min — AB (ref >90)
GFR, EST NON AFRICAN AMERICAN: 75 mL/min — AB (ref >90)
GFR, EST NON AFRICAN AMERICAN: 79 mL/min — AB (ref >90)
GLUCOSE: 148 mg/dL — AB (ref 70–99)
GLUCOSE: 223 mg/dL — AB (ref 70–99)
Glucose, Bld: 218 mg/dL — ABNORMAL HIGH (ref 70–99)
Glucose, Bld: 124 mg/dL — ABNORMAL HIGH (ref 70–99)
Glucose, Bld: 106 mg/dL — ABNORMAL HIGH (ref 70–99)
Glucose, Bld: 93 mg/dL (ref 70–99)
POTASSIUM: 3.4 meq/L — AB (ref 3.7–5.3)
POTASSIUM: 3.4 meq/L — AB (ref 3.7–5.3)
POTASSIUM: 3.5 meq/L — AB (ref 3.7–5.3)
POTASSIUM: 3.8 meq/L (ref 3.7–5.3)
Potassium: 3.9 mEq/L (ref 3.7–5.3)
Potassium: 3.4 mEq/L — ABNORMAL LOW (ref 3.7–5.3)
SODIUM: 142 meq/L (ref 137–147)
SODIUM: 138 meq/L (ref 137–147)
Sodium: 137 mEq/L (ref 137–147)
Sodium: 139 mEq/L (ref 137–147)
Sodium: 139 mEq/L (ref 137–147)
Sodium: 140 mEq/L (ref 137–147)

## 2013-03-08 LAB — URINE CULTURE
Colony Count: NO GROWTH
Culture: NO GROWTH

## 2013-03-08 LAB — GLUCOSE, CAPILLARY
GLUCOSE-CAPILLARY: 207 mg/dL — AB (ref 70–99)
GLUCOSE-CAPILLARY: 101 mg/dL — AB (ref 70–99)
Glucose-Capillary: 278 mg/dL — ABNORMAL HIGH (ref 70–99)
Glucose-Capillary: 144 mg/dL — ABNORMAL HIGH (ref 70–99)
Glucose-Capillary: 95 mg/dL (ref 70–99)
Glucose-Capillary: 88 mg/dL (ref 70–99)

## 2013-03-08 LAB — POCT I-STAT, CHEM 8
BUN: 18 mg/dL (ref 6–23)
CHLORIDE: 105 meq/L (ref 96–112)
CREATININE: 0.8 mg/dL (ref 0.50–1.35)
Calcium, Ion: 1.14 mmol/L (ref 1.12–1.23)
Glucose, Bld: 211 mg/dL — ABNORMAL HIGH (ref 70–99)
HEMATOCRIT: 33 % — AB (ref 39.0–52.0)
HEMOGLOBIN: 11.2 g/dL — AB (ref 13.0–17.0)
POTASSIUM: 3 meq/L — AB (ref 3.7–5.3)
SODIUM: 144 meq/L (ref 137–147)
TCO2: 21 mmol/L (ref 0–100)

## 2013-03-08 LAB — CBC
HEMATOCRIT: 33.1 % — AB (ref 39.0–52.0)
Hemoglobin: 11.7 g/dL — ABNORMAL LOW (ref 13.0–17.0)
MCH: 31.2 pg (ref 26.0–34.0)
MCHC: 35.3 g/dL (ref 30.0–36.0)
MCV: 88.3 fL (ref 78.0–100.0)
Platelets: 167 10*3/uL (ref 150–400)
RBC: 3.75 MIL/uL — ABNORMAL LOW (ref 4.22–5.81)
RDW: 13.2 % (ref 11.5–15.5)
WBC: 13.4 10*3/uL — ABNORMAL HIGH (ref 4.0–10.5)

## 2013-03-08 MED ORDER — PROPOFOL 10 MG/ML IV EMUL
0.0000 ug/kg/min | INTRAVENOUS | Status: DC
  Administered 2013-03-09: 25 ug/kg/min via INTRAVENOUS
  Administered 2013-03-09: 15 ug/kg/min via INTRAVENOUS
  Filled 2013-03-08 (×2): qty 100

## 2013-03-08 MED ORDER — HEPARIN SODIUM (PORCINE) 5000 UNIT/ML IJ SOLN
5000.0000 [IU] | Freq: Three times a day (TID) | INTRAMUSCULAR | Status: DC
  Administered 2013-03-08 – 2013-03-25 (×48): 5000 [IU] via SUBCUTANEOUS
  Filled 2013-03-08 (×54): qty 1

## 2013-03-08 MED ORDER — POTASSIUM CHLORIDE 10 MEQ/50ML IV SOLN
10.0000 meq | INTRAVENOUS | Status: AC
  Administered 2013-03-08 – 2013-03-09 (×3): 10 meq via INTRAVENOUS
  Filled 2013-03-08 (×3): qty 50

## 2013-03-08 MED ORDER — POTASSIUM CHLORIDE 10 MEQ/100ML IV SOLN: 10.0000 meq | INTRAVENOUS | Status: DC

## 2013-03-08 MED ORDER — HYDRALAZINE HCL 20 MG/ML IJ SOLN
10.0000 mg | INTRAMUSCULAR | Status: DC | PRN
  Administered 2013-03-11 – 2013-03-15 (×5): 20 mg via INTRAVENOUS
  Filled 2013-03-08 (×2): qty 2
  Filled 2013-03-08 (×2): qty 1
  Filled 2013-03-08 (×2): qty 2

## 2013-03-08 MED FILL — Medication: Qty: 1 | Status: AC

## 2013-03-08 NOTE — Progress Notes (Signed)
Name: Callaway Kurowski MRN: [id number] DOB: July 23, 1955    ADMISSION DATE:  03/07/2013    REFERRING MD :  EDP PRIMARY SERVICE: PCCM  CHIEF COMPLAINT: Post arrest   BRIEF PATIENT DESCRIPTION:  58 yo with witnessed sz at [employer]. Apneic and pulseless at scene. CPR x 5 minutes, king airway placed by EMS, intubated in ED. 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    SIGNIFICANT EVENTS / STUDIES:  2/13 CT head:  Hydrocephalus, with diffuse dilatation of the lateral and third  Ventricles. 2/13 Cards consult Terrence Dupont) and LHC: Cardiac cath with severe native vessel coronary artery disease but patent SVGs and LIMA 2/13 Neuro Consult 2/14 EEG:   LINES / TUBES: R fem CVL 2/13 >> 2/14 ETT 2/13 >> L IJ CVL 2/13 >> R fem A-line 2/13 >>   CULTURES: Urine 2/13 >> NEG Blood 2/13 >>   ANTIBIOTICS:    SUBJECTIVE:  Hypothermic, sedated, paralyzed. Hypertensive and bradycardic  VITAL SIGNS: Temp:  [90.7 F (32.6 C)-103.3 F (39.6 C)] 91.8 F (33.2 C) (02/14 1400) Pulse Rate:  [43-106] 47 (02/14 1400) Resp:  [9-24] 12 (02/14 1400) BP: (55-195)/(36-97) 130/71 mmHg (02/14 1400) SpO2:  [87 %-100 %] 100 % (02/14 1400) Arterial Line BP: (73-208)/(41-100) 154/84 mmHg (02/14 1400) FiO2 (%):  [30 %-100 %] 30 % (02/14 1135) Weight:  [77.2 kg (170 lb 3.1 oz)-80.8 kg (178 lb 2.1 oz)] 80.8 kg (178 lb 2.1 oz) (02/14 0430) HEMODYNAMICS: CVP:  [2 mmHg-6 mmHg] 4 mmHg VENTILATOR SETTINGS: Vent Mode:  [-] PRVC FiO2 (%):  [30 %-100 %] 30 % Set Rate:  [10 bmp-12 bmp] 12 bmp Vt Set:  [600 mL-700 mL] 600 mL PEEP:  [5 cmH20] 5 cmH20 Plateau Pressure:  [20 cmH20-25 cmH20] 21 cmH20 INTAKE / OUTPUT: Intake/Output     02/13 0701 - 02/14 0700 02/14 0701 - 02/15 0700   I.V. (mL/kg) 2522.3 (31.2) 714.7 (8.8)   Total Intake(mL/kg) 2522.3 (31.2) 714.7 (8.8)   Urine (mL/kg/hr) 820 1080 (1.8)   Total Output 820 1080   Net +1702.3 -365.3          PHYSICAL EXAMINATION: General:  sedated, paralyzed, intubated Neuro: unable due to NMB HEENT: pupils symmetric Cardiovascular:  Loletha Grayer, regular, no M Lungs:  Clear anteriorly, no wheezes Abdomen: Soft, nondistended Ext: no edema   LABS: I have reviewed all of today's lab results. Relevant abnormalities are discussed in the A/P section   CXR: no new film  ASSESSMENT / PLAN:  PULMONARY A: VDRF post arrest P:   Vent settings reviewed and adjusted Cont vent bundle Daily SBT as indicated  CARDIOVASCULAR A: VF arrest - ? Primary event or secondary to prolonged seizure Sinus bradycardia on amiodarone Hypertension P:  DC amiodarone PRN hydralazine to maintain MAP 85-105 mmHg  RENAL A:  No acute issues P:   Monitor BMET intermittently Monitor I/Os Correct electrolytes as indicated   GASTROINTESTINAL A:  No issues P:   SUP: PPI No nutrition until after rewarmed  HEMATOLOGIC A:  Mild anemia, no overt bleeding P:  DVT px: SQ heparin Monitor CBC intermittently Transfuse per ICU guidelines  INFECTIOUS A:  No overt infection identified P:   Micro and abx as above  ENDOCRINE A: DM2, controlled P:   Hold home glipizide and metformin Cont SSI  NEUROLOGIC A:   Hydrocephalus, uncertain chronicity Post anoxic encephalopathy P:   EEG today MRI ordered after rewarmed Reassess after rewarming Guarded prognosis    45 mins CCM time  Merton Border, MD ; [facility] [phone].  After 5:30 PM or weekends, call [phone]

## 2013-03-08 NOTE — Progress Notes (Signed)
EEG Completed; Results Pending  

## 2013-03-08 NOTE — Progress Notes (Signed)
Utilization Review Completed.  

## 2013-03-08 NOTE — Procedures (Signed)
ELECTROENCEPHALOGRAM REPORT   Patient: Eric [name]       Room #: [id number] EEG No. ID: [id number] Age: 58 y.o.        Sex: male Referring Physician: Alva Garnet Report Date:  03/08/2013        Interpreting Physician: Alexis Goodell D  History: Eric [name] is an 58 y.o. male with witnessed seizure followed by a cardiac arrest  Medications:  Scheduled: . antiseptic oral rinse  15 mL Mouth Rinse QID  . artificial tears   Both Eyes 3 times per day  . aspirin  324 mg Per Tube Daily  . chlorhexidine  15 mL Mouth Rinse BID  . cisatracurium  0.1 mg/kg Intravenous Once  . heparin subcutaneous  5,000 Units Subcutaneous 3 times per day  . insulin aspart  0-9 Units Subcutaneous 6 times per day  . pantoprazole (PROTONIX) IV  40 mg Intravenous QHS    Conditions of Recording:  This is a 16 channel EEG carried out with the patient in the intubated and sedated state.  The patient is hypothermic.  Description:  The background activity consists of a mixture of low to moderate voltage, poorly organized delta and theta activity that is diffusely distributed.  This activity is not continuous and there are intermittent periods of attenuation lasting up to 15 seconds that are seen frequently during there tracing.  These periods of attenuation are generalized and synchronous between the hemispheres.   No epileptiform activity is noted. Hyperventilation and intermittent photic stimulation were not performed.  IMPRESSION: This is an abnormal EEG due to a discontinuous slow background.  No epileptiform activity is noted.  This finding is consistent with the patient's current medications.     Alexis Goodell, MD [facility] [phone] 03/08/2013, 3:51 PM

## 2013-03-08 NOTE — Progress Notes (Signed)
P had ? Vtach/BBB/ventricular rhythm run w/ 30 % drop in aline bp-notified Dr Tamala Julian w/ order to replace potassium and Dr Doylene Canard notified w/ order to check mag level w/ next blood draw and restart amiodarone if it reroccurs.

## 2013-03-08 NOTE — Progress Notes (Addendum)
INITIAL NUTRITION ASSESSMENT  DOCUMENTATION CODES Per approved criteria  -Not Applicable   INTERVENTION: Once re-warmed, recommend initiation of nutrition support if suspect prolonged intubation. If enteral nutrition desired, recommend initiation of Vital AF 1.2 via enteral feeding tube at 25 ml/hr, advance by 10 ml q 4 hours, to goal of 45 ml/hr. 30 ml Prostat liquid protein via tube BID. Goal regimen will provide: 1496 kcal, 111 grams protein, 876 ml free water. *Rest of kcal will be met with current propofol infusion.* RD to continue to follow nutrition care plan.  NUTRITION DIAGNOSIS: Inadequate oral intake related to inability to eat as evidenced by NPO status.   Goal: Initiation of nutrition support after re-warming complete, if prolonged intubation expected.  Monitor:  weight trends, lab trends, I/O's, vent status/settings, respiratory status, termps  Reason for Assessment: VDRF  58 y.o. male  Admitting Dx: s/p cardiac arrest  ASSESSMENT: PMHx significant for HTN, HLD, DM. Admitted s/p witness seizure and cardiac arrest at [employer], required CPR x 5 minutes, pt was intubated in ED and coded again while there.   Pt went to cath lab urgently on admission. Findings reveal severe native vessl CAD. Pt is currently on hypothermia protocol.  Patient is currently intubated on ventilator support.  MV: 7.3 L/min Temp (24hrs), Avg:95.5 F (35.3 C), Min:90.7 F (32.6 C), Max:103.3 F (39.6 C)  Propofol: 13.9 ml/hr - provides 367 kcal  Discussed nutrition hx with family at bedside. Pt was eating well PTA. They mention that he had open heart surgery (unknown date) and he "lost a lot of weight after that and has been gaining it back." Pt with moderate temporal wasting. Did not complete full physical exam during this visit, as RN was tending to pt.  Height: Ht Readings from Last 1 Encounters:  03/07/13 6' (1.829 m)    Weight: Wt Readings from Last 1 Encounters:  03/08/13  178 lb 2.1 oz (80.8 kg)    Ideal Body Weight: 178 lb  % Ideal Body Weight: 100%  Wt Readings from Last 10 Encounters:  03/08/13 178 lb 2.1 oz (80.8 kg)  03/08/13 178 lb 2.1 oz (80.8 kg)    Usual Body Weight: n/a  % Usual Body Weight: n/a  BMI:  Body mass index is 24.15 kg/(m^2). Normal weight  Estimated Nutritional Needs (using temp of 37 degree C) Kcal: 1792 Protein: at least 97 grams daily Fluid: 1.6 - 1.8 liters daily  Skin: intact  Diet Order:   NPO  EDUCATION NEEDS: -No education needs identified at this time   Intake/Output Summary (Last 24 hours) at 03/08/13 1205 Last data filed at 03/08/13 1000  Gross per 24 hour  Intake 3007.95 ml  Output   1620 ml  Net 1387.95 ml    Last BM: 2/13  Labs:   Recent Labs Lab 03/07/13 1152 03/07/13 1219  03/07/13 2348 03/08/13 0400 03/08/13 0800  NA 141 136*  < > 137 140 142  K 3.8 3.9  < > 3.4* 3.9 3.8  CL 100 99  < > 102 105 107  CO2  --  21  < > _0 BUN 18 19  < > _1 CREATININE 1.00 1.06  < > 0.95 0.94 1.06  CALCIUM  --  8.5  < > 8.2* 8.2* 8.0*  MG  --  1.5  --   --   --   --   PHOS  --  3.8  --   --   --   --  GLUCOSE 212* 229*  < > 223* 218* 148*  < > = values in this interval not displayed.  CBG (last 3)   Recent Labs  03/07/13 2317 03/08/13 0357 03/08/13 0811  GLUCAP 190* 207* 144*    Scheduled Meds: . antiseptic oral rinse  15 mL Mouth Rinse QID  . artificial tears   Both Eyes 3 times per day  . aspirin  324 mg Per Tube Daily  . chlorhexidine  15 mL Mouth Rinse BID  . cisatracurium  0.1 mg/kg Intravenous Once  . heparin subcutaneous  5,000 Units Subcutaneous 3 times per day  . insulin aspart  0-9 Units Subcutaneous 6 times per day  . pantoprazole (PROTONIX) IV  40 mg Intravenous QHS    Continuous Infusions: . sodium chloride 10 mL/hr at 03/07/13 1900  . cisatracurium (NIMBEX) infusion 1.5 mcg/kg/min (03/07/13 1900)  . fentaNYL infusion INTRAVENOUS 100 mcg/hr (03/07/13  2000)  . propofol 30 mcg/kg/min (03/08/13 0442)    Past Medical History  Diagnosis Date  . Hypertension   . Hyperlipemia   . 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 Edema     No past surgical history on file.  Inda Coke MS, RD, LDN Inpatient Registered Dietitian Pager: [phone] After-hours pager: [phone]

## 2013-03-08 NOTE — Cardiovascular Report (Signed)
NAMERENOLD, SUITE              ACCOUNT NO.:  [id number]  MEDICAL RECORD NO.:  [id number]  LOCATION:  2H02C                        FACILITY:  [facility]  PHYSICIAN:  Annalisse Minkoff N. Terrence Dupont, M.D. DATE OF BIRTH:  [birth year]  DATE OF PROCEDURE:  03/07/2013 DATE OF DISCHARGE:                           CARDIAC CATHETERIZATION   PROCEDURES:  Left cardiac catheterization with selective left and right coronary angiography, left ventriculography, visualization of saphenous vein graft and left internal mammary artery graft via left groin using Judkins technique.  INDICATION FOR THE PROCEDURE:  Mr. Reath is 58 year old male with past medical history significant for coronary artery disease, status post CABG; hypertension; diabetes mellitus; hypercholesteremia; had seizure episode at [employer] followed by cardiac arrest, requiring CPR and intubation on the field.  In the ER, the patient had recurrent episodes of VFib arrest, requiring defibrillation.  No history could be obtained as the patient is intubated, sedated, no family member is available.  EKG done in the ER, showed normal sinus rhythm with septal Q- waves.  No acute ischemic changes were noted.  CT of the brain showed no evidence of acute bleed, but showed hydrocephalus with dilated third and lateral ventricle.  Due to recurrent episodes of witnessed VFib and significant cardiac history, the patient is brought emergently to the Cath Lab to rule out any significant ischemia.  PROCEDURE:  The patient was brought to the Cath Lab and was placed on the fluoroscopy table.  Left groin was prepped and draped in usual fashion.  Xylocaine 1% was used for local anesthesia in the left groin. With the help of thin wall needle, 6-French arterial sheath was placed. The sheath was aspirated and flushed.  Next, 6-French left Judkins catheter was advanced over the wire under fluoroscopic guidance up to the ascending aorta.  Wire was pulled out, the  catheter was aspirated and connected to the Manifold.  Catheter was further advanced and engaged into left coronary ostium.  Multiple views of the left system were taken.  Next, catheter was disengaged and was pulled out over the wire and was replaced with 5-French right Judkins catheter, which was advanced over the wire under fluoroscopic guidance up to the ascending aorta.  Wire was pulled out, the catheter was aspirated and connected to the Manifold.  Catheter was further advanced and engaged into right coronary ostium.  Multiple views of the right system were taken.  Next, catheter was disengaged and was advanced under fluoroscopic guidance up to the left subclavian artery.  This catheter was exchanged over the wire to LIMA diagnostic catheter, which was advanced over the wire under fluoroscopic guidance up to the left subclavian artery.  Catheter was further advanced and engaged into LIMA to LAD.  Multiple views of this graft were taken.  Next, catheter was disengaged and was pulled out over the wire and was replaced with 5-French left bypass diagnostic catheter, which was advanced over the wire under fluoroscopic guidance up to the ascending aorta.  Wire was pulled out, the catheter was aspirated and connected to the Manifold.  Catheter was further advanced and engaged into saphenous vein graft to obtuse marginal.  Multiple views of this graft were taken.  Next, catheter was  disengaged and was pulled out over the wire and was replaced with 5-French pigtail catheter, which was advanced over the wire under fluoroscopic guidance up to the ascending aorta.  Catheter was further advanced across the aortic valve into the LV.  LV pressures were recorded.  Next, left ventriculography was done in 30-degree RAO position.  Post-angiographic pressures were recorded from LV and then pullback pressures were recorded from the aorta.  There was no gradient across the aortic valve.  Next, the pigtail  catheter was pulled out over the wire.  Sheaths were aspirated and flushed.  FINDINGS:  LV showed good LV systolic function, LVH, EF of 50-55%.  Left main was patent.  LAD was 100% occluded proximally beyond the diagonal 1.  Diagonal 1 is small, which bifurcates proximally into two branches, which is diffusely diseased.  Left circumflex is 100% occluded beyond the proximal portion.  OM 1 is very very small.  RCA has 10-15% proximal mid stenosis.  PDA is less than 1.5 mm, has 80-90% mid sequential stenosis that is supplying very small area of myocardium.  PLV branch has 50-60% stenosis.  LIMA to LAD is patent.  Distally, LAD at the apex is diffusely diseased.  Saphenous vein graft to OM is patent, although obtuse marginal distally is diffusely diseased.  The patient tolerated the procedure well.  There were no complications.  The patient will be transferred to CCU in stable condition.     Allegra Lai. Terrence Dupont, M.D.     MNH/MEDQ  D:  03/07/2013  T:  03/08/2013  Job:  [id number]

## 2013-03-08 NOTE — Progress Notes (Signed)
Subjective:  Sedated and intubated. Monitor sinus bradycardia. Cardiac cath with severe native vessel coronary artery disease but patent SVGs and LIMA. On hypothermia protocol. T-90.9 F  Objective:  Vital Signs in the last 24 hours: Temp:  [90.7 F (32.6 C)-103.3 F (39.6 C)] 90.9 F (32.7 C) (02/14 1000) Pulse Rate:  [44-152] 44 (02/14 1000) Cardiac Rhythm:  [-] Sinus bradycardia (02/14 0730) Resp:  [9-32] 12 (02/14 1000) BP: (55-220)/(36-133) 146/74 mmHg (02/14 1000) SpO2:  [87 %-100 %] 100 % (02/14 1000) Arterial Line BP: (73-208)/(41-100) 173/90 mmHg (02/14 1000) FiO2 (%):  [40 %-100 %] 40 % (02/14 0725) Weight:  [77.2 kg (170 lb 3.1 oz)-86.183 kg (190 lb)] 80.8 kg (178 lb 2.1 oz) (02/14 0430)  Physical Exam: BP Readings from Last 1 Encounters:  03/08/13 146/74     Wt Readings from Last 1 Encounters:  03/08/13 80.8 kg (178 lb 2.1 oz)    Weight change:   HEENT: [place]/AT, Eyes-Conjunctiva-Pink, Sclera-Non-icteric. Intubated. Neck: No JVD, No bruit, Trachea midline. Lungs:  Clear, Bilateral. Cardiac:  Regular and slow rhythm, normal S1 and S2, no S3.  Abdomen:  Soft, non-tender. Extremities:  No edema present. No cyanosis. No clubbing. CNS: Sedated. Skin: Warm and dry.   Intake/Output from previous day: 02/13 0701 - 02/14 0700 In: 2522.3 [I.V.:2522.3] Out: 11 [Urine:820]    Lab Results: BMET    Component Value Date/Time   NA 142 03/08/2013 0800   K 3.8 03/08/2013 0800   CL 107 03/08/2013 0800   CO2 23 03/08/2013 0800   GLUCOSE 148* 03/08/2013 0800   BUN 22 03/08/2013 0800   CREATININE 1.06 03/08/2013 0800   CALCIUM 8.0* 03/08/2013 0800   GFRNONAA 75* 03/08/2013 0800   GFRAA 87* 03/08/2013 0800   CBC    Component Value Date/Time   WBC 13.4* 03/08/2013 0400   RBC 3.75* 03/08/2013 0400   HGB 11.7* 03/08/2013 0400   HCT 33.1* 03/08/2013 0400   PLT 167 03/08/2013 0400   MCV 88.3 03/08/2013 0400   MCH 31.2 03/08/2013 0400   MCHC 35.3 03/08/2013 0400   RDW 13.2 03/08/2013  0400   LYMPHSABS 2.3 03/07/2013 1136   MONOABS 0.4 03/07/2013 1136   EOSABS 0.1 03/07/2013 1136   BASOSABS 0.0 03/07/2013 1136   CARDIAC ENZYMES Lab Results  Component Value Date   TROPONINI <0.30 03/07/2013    Scheduled Meds: . sodium chloride  2,000 mL Intravenous Once  . antiseptic oral rinse  15 mL Mouth Rinse QID  . artificial tears   Both Eyes 3 times per day  . aspirin  324 mg Per Tube Daily  . aspirin  300 mg Rectal NOW  . chlorhexidine  15 mL Mouth Rinse BID  . cisatracurium  0.1 mg/kg Intravenous Once  . fentaNYL  50 mcg Intravenous Once  . insulin aspart  0-9 Units Subcutaneous 6 times per day  . midazolam  2 mg Intravenous Once  . pantoprazole (PROTONIX) IV  40 mg Intravenous QHS   Continuous Infusions: . sodium chloride 100 mL/hr at 03/07/13 1900  . sodium chloride 10 mL/hr at 03/07/13 1900  . amiodarone (NEXTERONE PREMIX) 360 mg/200 mL dextrose 30 mg/hr (03/08/13 0108)  . cisatracurium (NIMBEX) infusion 1.5 mcg/kg/min (03/07/13 1900)  . fentaNYL infusion INTRAVENOUS 100 mcg/hr (03/07/13 2000)  . nitroGLYCERIN Stopped (03/07/13 1630)  . norepinephrine (LEVOPHED) Adult infusion 3 mcg/min (03/08/13 0500)  . propofol 30 mcg/kg/min (03/08/13 0442)   PRN Meds:.acetaminophen (TYLENOL) oral liquid 160 mg/5 mL, cisatracurium, fentaNYL, fentaNYL, metoprolol  Assessment/Plan: Status post recurrent V. fib cardiac arrest  Coronary artery disease status post CABG  Hypertension  Diabetes mellitus, II Hypercholesteremia  Status post seizures  Hydrocephalus  Continue supportive care.   LOS: 1 day    Dixie Dials  MD  03/08/2013, 10:25 AM

## 2013-03-09 ENCOUNTER — Inpatient Hospital Stay (HOSPITAL_COMMUNITY): Payer: Medicaid Other

## 2013-03-09 LAB — CBC
HCT: 30.9 % — ABNORMAL LOW (ref 39.0–52.0)
Hemoglobin: 11 g/dL — ABNORMAL LOW (ref 13.0–17.0)
MCH: 31.3 pg (ref 26.0–34.0)
MCHC: 35.6 g/dL (ref 30.0–36.0)
MCV: 87.8 fL (ref 78.0–100.0)
PLATELETS: 130 10*3/uL — AB (ref 150–400)
RBC: 3.52 MIL/uL — AB (ref 4.22–5.81)
RDW: 13.4 % (ref 11.5–15.5)
WBC: 7.4 10*3/uL (ref 4.0–10.5)

## 2013-03-09 LAB — GLUCOSE, CAPILLARY
GLUCOSE-CAPILLARY: 111 mg/dL — AB (ref 70–99)
GLUCOSE-CAPILLARY: 129 mg/dL — AB (ref 70–99)
Glucose-Capillary: 99 mg/dL (ref 70–99)
Glucose-Capillary: 114 mg/dL — ABNORMAL HIGH (ref 70–99)
Glucose-Capillary: 101 mg/dL — ABNORMAL HIGH (ref 70–99)
Glucose-Capillary: 122 mg/dL — ABNORMAL HIGH (ref 70–99)

## 2013-03-09 LAB — BASIC METABOLIC PANEL
BUN: 21 mg/dL (ref 6–23)
BUN: 21 mg/dL (ref 6–23)
CALCIUM: 8.1 mg/dL — AB (ref 8.4–10.5)
CO2: 22 meq/L (ref 19–32)
CO2: 21 meq/L (ref 19–32)
Calcium: 8 mg/dL — ABNORMAL LOW (ref 8.4–10.5)
Chloride: 104 mEq/L (ref 96–112)
Chloride: 107 mEq/L (ref 96–112)
Creatinine, Ser: 0.96 mg/dL (ref 0.50–1.35)
Creatinine, Ser: 1.07 mg/dL (ref 0.50–1.35)
GFR calc Af Amer: >90 mL/min (ref >90)
GFR calc non Af Amer: 74 mL/min — ABNORMAL LOW (ref >90)
GFR, EST AFRICAN AMERICAN: 86 mL/min — AB (ref >90)
GFR, EST NON AFRICAN AMERICAN: 89 mL/min — AB (ref >90)
GLUCOSE: 105 mg/dL — AB (ref 70–99)
Glucose, Bld: 134 mg/dL — ABNORMAL HIGH (ref 70–99)
POTASSIUM: 3.9 meq/L (ref 3.7–5.3)
Potassium: 4.1 mEq/L (ref 3.7–5.3)
SODIUM: 142 meq/L (ref 137–147)
SODIUM: 138 meq/L (ref 137–147)

## 2013-03-09 LAB — COMPREHENSIVE METABOLIC PANEL
ALBUMIN: 2 g/dL — AB (ref 3.5–5.2)
ALT: 71 U/L — ABNORMAL HIGH (ref 0–53)
AST: 71 U/L — AB (ref 0–37)
Alkaline Phosphatase: 50 U/L (ref 39–117)
BUN: 20 mg/dL (ref 6–23)
CALCIUM: 8 mg/dL — AB (ref 8.4–10.5)
CO2: 21 meq/L (ref 19–32)
Chloride: 108 mEq/L (ref 96–112)
Creatinine, Ser: 0.98 mg/dL (ref 0.50–1.35)
GFR calc Af Amer: >90 mL/min (ref >90)
GFR, EST NON AFRICAN AMERICAN: 88 mL/min — AB (ref >90)
Glucose, Bld: 118 mg/dL — ABNORMAL HIGH (ref 70–99)
Potassium: 4 mEq/L (ref 3.7–5.3)
SODIUM: 142 meq/L (ref 137–147)
Total Bilirubin: 0.6 mg/dL (ref 0.3–1.2)
Total Protein: 7 g/dL (ref 6.0–8.3)

## 2013-03-09 LAB — MAGNESIUM: MAGNESIUM: 1.6 mg/dL (ref 1.5–2.5)

## 2013-03-09 MED ORDER — SODIUM CHLORIDE 0.9 % IV SOLN
0.0000 ug/h | INTRAVENOUS | Status: DC
  Filled 2013-03-09: qty 50

## 2013-03-09 MED ORDER — SODIUM CHLORIDE 0.9 % IV SOLN
1.0000 ug/kg/min | INTRAVENOUS | Status: DC
  Filled 2013-03-09: qty 20

## 2013-03-09 MED ORDER — DEXTROSE 5 % IV SOLN
2.0000 ug/min | INTRAVENOUS | Status: DC
  Filled 2013-03-09: qty 16

## 2013-03-09 MED ORDER — SODIUM CHLORIDE 0.9 % IV SOLN: INTRAVENOUS | Status: DC

## 2013-03-09 MED ORDER — MAGNESIUM SULFATE 40 MG/ML IJ SOLN
2.0000 g | Freq: Once | INTRAMUSCULAR | Status: AC
  Administered 2013-03-09: 2 g via INTRAVENOUS
  Filled 2013-03-09: qty 50

## 2013-03-09 MED ORDER — FENTANYL BOLUS VIA INFUSION
25.0000 ug | INTRAVENOUS | Status: DC | PRN
  Filled 2013-03-09: qty 50

## 2013-03-09 MED ORDER — CISATRACURIUM BOLUS VIA INFUSION
0.0500 mg/kg | INTRAVENOUS | Status: DC | PRN
  Filled 2013-03-09: qty 4

## 2013-03-09 MED ORDER — PROPOFOL 10 MG/ML IV EMUL
0.0000 ug/kg/min | INTRAVENOUS | Status: DC
  Administered 2013-03-09: 40 ug/kg/min via INTRAVENOUS
  Administered 2013-03-10: 50 ug/kg/min via INTRAVENOUS
  Administered 2013-03-10 (×2): 40 ug/kg/min via INTRAVENOUS
  Administered 2013-03-10: 50 ug/kg/min via INTRAVENOUS
  Administered 2013-03-10: 20 ug/kg/min via INTRAVENOUS
  Administered 2013-03-11 (×2): 50 ug/kg/min via INTRAVENOUS
  Administered 2013-03-11: 35 ug/kg/min via INTRAVENOUS
  Administered 2013-03-11: 30 ug/kg/min via INTRAVENOUS
  Administered 2013-03-11: 35 ug/kg/min via INTRAVENOUS
  Administered 2013-03-12: 45 ug/kg/min via INTRAVENOUS
  Administered 2013-03-12: 50 ug/kg/min via INTRAVENOUS
  Filled 2013-03-09 (×15): qty 100

## 2013-03-09 NOTE — Consult Note (Signed)
Subjective:  Intubated and sedated. Rewarmed from hypothermia. Heart rate improving.  Objective:  Vital Signs in the last 24 hours: Temp:  [90.7 F (32.6 C)-97.3 F (36.3 C)] 97.3 F (36.3 C) (02/15 1100) Pulse Rate:  [43-91] 65 (02/15 1142) Cardiac Rhythm:  [-] Normal sinus rhythm (02/15 0800) Resp:  [6-21] 12 (02/15 1142) BP: (79-166)/(42-88) 79/42 mmHg (02/15 1142) SpO2:  [98 %-100 %] 99 % (02/15 1142) Arterial Line BP: (92-197)/(45-106) 136/63 mmHg (02/15 1130) FiO2 (%):  [30 %] 30 % (02/15 1142) Weight:  [81 kg (178 lb 9.2 oz)] 81 kg (178 lb 9.2 oz) (02/15 0429)  Physical Exam: BP Readings from Last 1 Encounters:  03/09/13 79/42     Wt Readings from Last 1 Encounters:  03/09/13 81 kg (178 lb 9.2 oz)    Weight change: -5.183 kg (-11 lb 6.8 oz)  HEENT: [place]/AT, Eyes- Conjunctiva-Pink, Sclera-Non-icteric. Intubated. Neck: No JVD, No bruit, Trachea midline. Lungs:  Clear, Bilateral. Cardiac:  Regular rhythm, normal S1 and S2, no S3.  Abdomen:  Soft, non-tender. Extremities:  No edema present. No cyanosis. No clubbing. CNS: Sedated. Skin: Warm and dry.   Intake/Output from previous day: 02/14 0701 - 02/15 0700 In: 1733.8 [I.V.:1533.8; IV Piggyback:200] Out: 1660 [Urine:1660]    Lab Results: BMET    Component Value Date/Time   NA 142 03/09/2013 0750   K 4.1 03/09/2013 0750   CL 107 03/09/2013 0750   CO2 22 03/09/2013 0750   GLUCOSE 134* 03/09/2013 0750   BUN 21 03/09/2013 0750   CREATININE 1.07 03/09/2013 0750   CALCIUM 8.1* 03/09/2013 0750   GFRNONAA 74* 03/09/2013 0750   GFRAA 86* 03/09/2013 0750   CBC    Component Value Date/Time   WBC 7.4 03/09/2013 0400   RBC 3.52* 03/09/2013 0400   HGB 11.0* 03/09/2013 0400   HCT 30.9* 03/09/2013 0400   PLT 130* 03/09/2013 0400   MCV 87.8 03/09/2013 0400   MCH 31.3 03/09/2013 0400   MCHC 35.6 03/09/2013 0400   RDW 13.4 03/09/2013 0400   LYMPHSABS 2.3 03/07/2013 1136   MONOABS 0.4 03/07/2013 1136   EOSABS 0.1 03/07/2013 1136   BASOSABS 0.0 03/07/2013 1136   CARDIAC ENZYMES Lab Results  Component Value Date   TROPONINI <0.30 03/07/2013    Scheduled Meds: . antiseptic oral rinse  15 mL Mouth Rinse QID  . aspirin  324 mg Per Tube Daily  . chlorhexidine  15 mL Mouth Rinse BID  . cisatracurium  0.1 mg/kg Intravenous Once  . heparin subcutaneous  5,000 Units Subcutaneous 3 times per day  . insulin aspart  0-9 Units Subcutaneous 6 times per day  . pantoprazole (PROTONIX) IV  40 mg Intravenous QHS   Continuous Infusions: . sodium chloride 10 mL/hr at 03/09/13 0800  . sodium chloride 10 mL/hr at 03/09/13 0800  . cisatracurium (NIMBEX) infusion Stopped (03/09/13 1000)  . fentaNYL infusion INTRAVENOUS 150 mcg/hr (03/09/13 1100)  . norepinephrine (LEVOPHED) Adult infusion 7 mcg/min (03/09/13 1100)  . propofol 25 mcg/kg/min (03/09/13 0945)   PRN Meds:.acetaminophen (TYLENOL) oral liquid 160 mg/5 mL, cisatracurium, fentaNYL, hydrALAZINE  Assessment/Plan: Status post recurrent V. fib cardiac arrest  Coronary artery disease, native vessel  Status post CABG  Hypertension  Diabetes mellitus, II  Hypercholesteremia  Status post seizures  Hydrocephalus  Continue medical care.   LOS: 2 days    Dixie Dials  MD  03/09/2013, 11:47 AM

## 2013-03-09 NOTE — Progress Notes (Signed)
Name: Eric [name] MRN: [id number] DOB: 05/20/1955    ADMISSION DATE:  03/07/2013    REFERRING MD :  EDP PRIMARY SERVICE: PCCM  CHIEF COMPLAINT: Post arrest   BRIEF PATIENT DESCRIPTION:  58 yo with witnessed sz at [employer]. Apneic and pulseless at scene. CPR x 5 minutes, king airway placed by EMS, intubated in ED. 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    SIGNIFICANT EVENTS / STUDIES:  2/13 CT head:  Hydrocephalus, with diffuse dilatation of the lateral and third ventricles. 2/13 Cards consult Terrence Dupont) and LHC: Cardiac cath with severe native vessel coronary artery disease but patent SVGs and LIMA 2/13 Neuro Consult: MRI and EEG recommended 2/14 EEG: This is an abnormal EEG due to a discontinuous slow background. No epileptiform activity is noted. This finding is consistent with the patient's current medications.  2/14 Amiodarone discontinued due to sinus bradycardia  LINES / TUBES: R fem CVL 2/13 >> 2/14 ETT 2/13 >> L IJ CVL 2/13 >> R fem A-line 2/13 >>   CULTURES: Urine 2/13 >> NEG Blood 2/13 >>   ANTIBIOTICS:    SUBJECTIVE:  Rewarming started last night. Not yet @ target temp. Episode of VT last PM  VITAL SIGNS: Temp:  [90.7 F (32.6 C)-93.2 F (34 C)] 93.2 F (34 C) (02/15 0400) Pulse Rate:  [43-72] 72 (02/15 0400) Resp:  [6-20] 14 (02/15 0400) BP: (90-166)/(60-88) 105/66 mmHg (02/15 0300) SpO2:  [100 %] 100 % (02/15 0400) Arterial Line BP: (86-197)/(54-106) 189/92 mmHg (02/15 0400) FiO2 (%):  [30 %-40 %] 30 % (02/15 0336) Weight:  [81 kg (178 lb 9.2 oz)] 81 kg (178 lb 9.2 oz) (02/15 0429) HEMODYNAMICS: CVP:  [6 mmHg] 6 mmHg VENTILATOR SETTINGS: Vent Mode:  [-] PRVC FiO2 (%):  [30 %-40 %] 30 % Set Rate:  [10 bmp-12 bmp] 12 bmp Vt Set:  [600 mL-700 mL] 600 mL PEEP:  [5 cmH20] 5 cmH20 Plateau Pressure:  [20 cmH20-25 cmH20] 20 cmH20 INTAKE / OUTPUT: Intake/Output     02/14 0701 - 02/15 0700   I.V. (mL/kg) 1396.4 (17.2)   IV  Piggyback 200   Total Intake(mL/kg) 1596.4 (19.7)   Urine (mL/kg/hr) 1610 (0.8)   Total Output 1610   Net -13.6         PHYSICAL EXAMINATION: General: sedated, paralyzed, intubated Neuro: unable due to NMB HEENT: pupils symmetric Cardiovascular:  Loletha Grayer, regular, no M Lungs:  Clear anteriorly, no wheezes Abdomen: Soft, nondistended Ext: no edema   LABS: I have reviewed all of today's lab results. Relevant abnormalities are discussed in the A/P section   CXR: pending  ASSESSMENT / PLAN:  PULMONARY A: VDRF post arrest P:   Vent settings reviewed and adjusted Cont vent bundle Daily SBT as indicated  CARDIOVASCULAR A: VF arrest - ? Primary event or secondary to prolonged seizure Sinus bradycardia on amiodarone Hypertension Recurrent VT off amiodarone P:  Cont PRN hydralazine to maintain MAP 85-105 mmHg Will leave decision re: restart of amiodarone to Cards  RENAL A:  Hypokalemia, resolved P:   Monitor BMET intermittently Monitor I/Os Correct electrolytes as indicated   GASTROINTESTINAL A:  No issues P:   SUP: PPI No nutrition until after rewarmed  HEMATOLOGIC A:  Mild anemia, no overt bleeding Mild thrombocytopenia P:  DVT px: SQ heparin Monitor CBC intermittently Transfuse per ICU guidelines  INFECTIOUS A:  No overt infection identified P:   Micro and abx as above  ENDOCRINE A: DM2, controlled P:  Holding home glipizide and metformin Cont SSI  NEUROLOGIC A:   Hydrocephalus, uncertain chronicity Post anoxic encephalopathy Seizures - ? New onset P:   MRI ordered after rewarmed Reassess after rewarming Neuro following Guarded prognosis    35 mins CCM time   Merton Border, MD ; [facility] service Mobile [phone].  After 5:30 PM or weekends, call [phone]

## 2013-03-09 NOTE — Progress Notes (Addendum)
Nimbex gtt off at 1000, at 1100, pt alert and pulling at tube, following commands; MD notified; leave on sedation and restart levophed as needed for pressure support; will continue to monitor closely and update as needed

## 2013-03-10 ENCOUNTER — Inpatient Hospital Stay (HOSPITAL_COMMUNITY): Payer: Medicaid Other

## 2013-03-10 LAB — CK TOTAL AND CKMB (NOT AT ARMC)
CK, MB: 1.9 ng/mL (ref 0.3–4.0)
Relative Index: 0.5 (ref 0.0–2.5)
Total CK: 364 U/L — ABNORMAL HIGH (ref 7–232)

## 2013-03-10 LAB — LACTIC ACID, PLASMA: Lactic Acid, Venous: 0.9 mmol/L (ref 0.5–2.2)

## 2013-03-10 LAB — CBC
HCT: 26.9 % — ABNORMAL LOW (ref 39.0–52.0)
Hemoglobin: 9.2 g/dL — ABNORMAL LOW (ref 13.0–17.0)
MCH: 30.9 pg (ref 26.0–34.0)
MCHC: 34.2 g/dL (ref 30.0–36.0)
MCV: 90.3 fL (ref 78.0–100.0)
PLATELETS: 146 10*3/uL — AB (ref 150–400)
RBC: 2.98 MIL/uL — ABNORMAL LOW (ref 4.22–5.81)
RDW: 13.6 % (ref 11.5–15.5)
WBC: 7.6 10*3/uL (ref 4.0–10.5)

## 2013-03-10 LAB — GLUCOSE, CAPILLARY
Glucose-Capillary: 115 mg/dL — ABNORMAL HIGH (ref 70–99)
Glucose-Capillary: 137 mg/dL — ABNORMAL HIGH (ref 70–99)
Glucose-Capillary: 112 mg/dL — ABNORMAL HIGH (ref 70–99)

## 2013-03-10 LAB — TRIGLYCERIDES: Triglycerides: 191 mg/dL — ABNORMAL HIGH (ref <150)

## 2013-03-10 LAB — BASIC METABOLIC PANEL
BUN: 24 mg/dL — ABNORMAL HIGH (ref 6–23)
CALCIUM: 7.7 mg/dL — AB (ref 8.4–10.5)
CO2: 22 mEq/L (ref 19–32)
CREATININE: 1.65 mg/dL — AB (ref 0.50–1.35)
Chloride: 109 mEq/L (ref 96–112)
GFR calc non Af Amer: 44 mL/min — ABNORMAL LOW (ref >90)
GFR, EST AFRICAN AMERICAN: 51 mL/min — AB (ref >90)
Glucose, Bld: 155 mg/dL — ABNORMAL HIGH (ref 70–99)
Potassium: 4.2 mEq/L (ref 3.7–5.3)
SODIUM: 142 meq/L (ref 137–147)

## 2013-03-10 LAB — PROCALCITONIN: Procalcitonin: 11.11 ng/mL

## 2013-03-10 MED ORDER — VITAL HIGH PROTEIN PO LIQD
1000.0000 mL | ORAL | Status: DC
  Administered 2013-03-10 – 2013-03-11 (×4): 1000 mL
  Filled 2013-03-10 (×5): qty 1000

## 2013-03-10 MED ORDER — FENTANYL CITRATE 0.05 MG/ML IJ SOLN
25.0000 ug | INTRAMUSCULAR | Status: DC | PRN
  Administered 2013-03-10 – 2013-03-11 (×3): 100 ug via INTRAVENOUS
  Administered 2013-03-12: 50 ug via INTRAVENOUS
  Administered 2013-03-13: 100 ug via INTRAVENOUS
  Administered 2013-03-13 – 2013-03-15 (×2): 50 ug via INTRAVENOUS
  Filled 2013-03-10 (×8): qty 2

## 2013-03-10 MED ORDER — METOPROLOL TARTRATE 25 MG PO TABS
25.0000 mg | ORAL_TABLET | Freq: Two times a day (BID) | ORAL | Status: DC
  Administered 2013-03-10 – 2013-03-11 (×3): 25 mg via ORAL
  Filled 2013-03-10 (×4): qty 1

## 2013-03-10 NOTE — Progress Notes (Signed)
Subjective:  Patient remains intubated sedated.  Spiked to 102 yesterday.no further V. Tach or V. Fib of IV amiodarone  Objective:  Vital Signs in the last 24 hours: Temp:  [98.6 F (37 C)-102.2 F (39 C)] 100.6 F (38.1 C) (02/16 1200) Pulse Rate:  [63-103] 92 (02/16 1200) Resp:  [0-28] 27 (02/16 1200) BP: (75-171)/(40-89) 135/73 mmHg (02/16 1200) SpO2:  [96 %-100 %] 100 % (02/16 1200) Arterial Line BP: (87-197)/(43-84) 152/71 mmHg (02/16 1200) FiO2 (%):  [10 %-30 %] 10 % (02/16 1200) Weight:  [77 kg (169 lb 12.1 oz)] 77 kg (169 lb 12.1 oz) (02/16 0400)  Intake/Output from previous day: 02/15 0701 - 02/16 0700 In: 1100.8 [I.V.:1100.8] Out: 850 [Urine:850] Intake/Output from this shift: Total I/O In: 261.9 [I.V.:201.9; NG/GT:60] Out: 325 [Urine:325]  Physical Exam: Neck: no adenopathy, no carotid bruit, no JVD and supple, symmetrical, trachea midline Lungs: decreased breath sounds at bases with occasional rhonchi Heart: regular rate and rhythm, S1, S2 normal and soft systolic murmur noted no S3 gallop Abdomen: soft, non-tender; bowel sounds normal; no masses,  no organomegaly Extremities: extremities normal, atraumatic, no cyanosis or edema  Lab Results:  Recent Labs  03/09/13 0400 03/10/13 0400  WBC 7.4 7.6  HGB 11.0* 9.2*  PLT 130* 146*    Recent Labs  03/09/13 0750 03/10/13 0400  NA 142 142  K 4.1 4.2  CL 107 109  CO2 22 22  GLUCOSE 134* 155*  BUN 21 24*  CREATININE 1.07 1.65*   No results found for this basename: TROPONINI, CK, MB,  in the last 72 hours Hepatic Function Panel  Recent Labs  03/09/13 0400  PROT 7.0  ALBUMIN 2.0*  AST 71*  ALT 71*  ALKPHOS 50  BILITOT 0.6   No results found for this basename: CHOL,  in the last 72 hours No results found for this basename: PROTIME,  in the last 72 hours  Imaging: Imaging results have been reviewed and Dg Chest Port 1 View  03/10/2013   CLINICAL DATA:  Respiratory failure  EXAM: PORTABLE CHEST  - 1 VIEW  COMPARISON:  Prior radiograph from 03/09/2013  FINDINGS: Endotracheal tube terminates well above the carina. Enteric tube courses into the abdomen with tip located in the gastric fundus. Left IJ central venous catheter tip overlies the distal SVC. Cardiac and mediastinal silhouettes unchanged. Median sternotomy wires remain intact.  The lungs are normally inflated. There has been interval improvement in previously seen confluent opacity within the right perihilar region. No overt pulmonary edema. No new focal infiltrate or pneumothorax. No pleural effusion.  IMPRESSION: 1. Lines and tubes in satisfactory position. 2. Interval improvement in the hazy opacity overlying the right mid lung. No new focal infiltrate or overt pulmonary edema identified.   Electronically Signed   By: Jeannine Boga M.D.   On: 03/10/2013 06:09   Dg Chest Port 1 View  03/09/2013   CLINICAL DATA:  Respiratory failure.  EXAM: PORTABLE CHEST - 1 VIEW  COMPARISON:  03/07/2013  FINDINGS: Endotracheal tube has tip approximately 3.5 cm above the carina. Enteric tube courses into the region of the stomach and off the inferior portion of the film. Left IJ central venous catheter is unchanged with tip over the SVC. Lungs are adequately inflated with slight worsening hazy opacification over the central right lung. No evidence of effusion or pneumothorax. Cardiomediastinal silhouette and remainder the exam is unchanged.  IMPRESSION: Slight worsening hazy opacification over the central right lung which may be due to  asymmetric edema or infection.  Tubes and lines as described.   Electronically Signed   By: Marin Olp M.D.   On: 03/09/2013 08:46    Cardiac Studies:  Assessment/Plan:  Status post recurrent V. fib cardiac arrest  Vent dependent respiratory failure secondary to above rule out aspiration Coronary artery disease status post CABG  Hypertension  Diabetes mellitus  Hypercholesteremia  Status post seizures   Hydrocephalus  Plan Weaning and antibiotics per CCM.  ADD BLOCKERS AS PER ORDERS. We will get EP consult  LOS: 3 days    Eric [name] N 03/10/2013, 1:17 PM

## 2013-03-10 NOTE — Progress Notes (Signed)
Subjective: Patient now warmed.  Sedation was attempted to be discontinued per nursing and had to be restarted secondary to agitation.  Patient is now back on Propofol.  Remains intubated.    Objective: Current vital signs: BP 119/65  Pulse 87  Temp(Src) 100.6 F (38.1 C) (Core (Comment))  Resp 27  Ht 6' (1.829 m)  Wt 77 kg (169 lb 12.1 oz)  BMI 23.02 kg/m2  SpO2 100% Vital signs in last 24 hours: Temp:  [99 F (37.2 C)-102.2 F (39 C)] 100.6 F (38.1 C) (02/16 1300) Pulse Rate:  [64-103] 87 (02/16 1300) Resp:  [0-28] 27 (02/16 1300) BP: (75-171)/(40-89) 119/65 mmHg (02/16 1300) SpO2:  [96 %-100 %] 100 % (02/16 1300) Arterial Line BP: (87-197)/(43-84) 135/62 mmHg (02/16 1300) FiO2 (%):  [10 %-30 %] 10 % (02/16 1300) Weight:  [77 kg (169 lb 12.1 oz)] 77 kg (169 lb 12.1 oz) (02/16 0400)  Intake/Output from previous day: 02/15 0701 - 02/16 0700 In: 1100.8 [I.V.:1100.8] Out: 850 [Urine:850] Intake/Output this shift: Total I/O In: 306.2 [I.V.:246.2; NG/GT:60] Out: 325 [Urine:325] Nutritional status:    Neurologic Exam: Mental Status: Patient does not respond to verbal stimuli.  With deep sternal rub grimaces.  Does not follow commands.  No verbalizations noted.  Cranial Nerves: II: patient does not respond confrontation bilaterally, pupils right 3 mm, left 3 mm,and reactive bilaterally III,IV,VI: doll's response absent bilaterally.  V,VII: corneal reflex reduced bilaterally  VIII: patient does not respond to verbal stimuli IX,X: gag reflex absent, XI: trapezius strength unable to test bilaterally XII: tongue strength unable to test Motor: Extremities flaccid throughout.  No spontaneous movement noted.  No purposeful movements noted. Sensory: Does not respond to noxious stimuli in any extremity. Deep Tendon Reflexes:  Absent throughout. Plantars: mute bilaterally Cerebellar: Unable to perform    Lab Results: Basic Metabolic Panel:  Recent Labs Lab  03/07/13 1152 03/07/13 1219  03/08/13 2000 03/09/13 0015 03/09/13 0400 03/09/13 0750 03/10/13 0400  NA 141 136*  < > 138 138 142 142 142  K 3.8 3.9  < > 3.4* 3.9 4.0 4.1 4.2  CL 100 99  < > 105 104 108 107 109  CO2  --  21  < > 22 21 21 22 22   GLUCOSE 212* 229*  < > 93 105* 118* 134* 155*  BUN 18 19  < > 20 21 20 21  24*  CREATININE 1.00 1.06  < > 0.91 0.96 0.98 1.07 1.65*  CALCIUM  --  8.5  < > 7.9* 8.0* 8.0* 8.1* 7.7*  MG  --  1.5  --   --  1.6  --   --   --   PHOS  --  3.8  --   --   --   --   --   --   < > = values in this interval not displayed.  Liver Function Tests:  Recent Labs Lab 03/07/13 1136 03/07/13 1219 03/09/13 0400  AST 49* 45* 71*  ALT 59* 54* 71*  ALKPHOS 61 59 50  BILITOT 0.3 0.4 0.6  PROT 8.2 7.6 7.0  ALBUMIN 2.6* 2.3* 2.0*   No results found for this basename: LIPASE, AMYLASE,  in the last 168 hours No results found for this basename: AMMONIA,  in the last 168 hours  CBC:  Recent Labs Lab 03/07/13 1136  03/07/13 1219 03/07/13 2314 03/08/13 0400 03/09/13 0400 03/10/13 0400  WBC 6.0  --  6.4  --  13.4* 7.4 7.6  NEUTROABS  3.1  --   --   --   --   --   --   HGB 11.8*  < > 12.1* 11.2* 11.7* 11.0* 9.2*  HCT 33.5*  < > 33.9* 33.0* 33.1* 30.9* 26.9*  MCV 88.4  --  88.3  --  88.3 87.8 90.3  PLT 166  --  208  --  167 130* 146*  < > = values in this interval not displayed.  Cardiac Enzymes:  Recent Labs Lab 03/07/13 1136 03/10/13 1130  CKTOTAL  --  364*  CKMB  --  1.9  TROPONINI <0.30  --     Lipid Panel:  Recent Labs Lab 03/07/13 1248  TRIG 124    CBG:  Recent Labs Lab 03/09/13 1546 03/09/13 1936 03/09/13 2349 03/10/13 0437 03/10/13 1126  GLUCAP 101* 122* 115* 87* 112*    Microbiology: Results for orders placed during the hospital encounter of 03/07/13  URINE CULTURE     Status: None   Collection Time    03/07/13 12:19 PM      Result Value Ref Range Status   Specimen Description URINE, CATHETERIZED   Final    Special Requests NONE   Final   Culture  Setup Time     Final   Value: 03/07/2013 13:23     Performed at [place]     Final   Value: NO GROWTH     Performed at [facility]   Culture     Final   Value: NO GROWTH     Performed at [facility]   Report Status 03/08/2013 FINAL   Final  MRSA PCR SCREENING     Status: None   Collection Time    03/07/13  2:35 PM      Result Value Ref Range Status   MRSA by PCR NEGATIVE  NEGATIVE Final   Comment:            The GeneXpert MRSA Assay (FDA     approved for NASAL specimens     only), is one component of a     comprehensive MRSA colonization     surveillance program. It is not     intended to diagnose MRSA     infection nor to guide or     monitor treatment for     MRSA infections.  CULTURE, BLOOD (ROUTINE X 2)     Status: None   Collection Time    03/07/13  7:42 PM      Result Value Ref Range Status   Specimen Description BLOOD LEFT HAND   Final   Special Requests BOTTLES DRAWN AEROBIC ONLY 1CC   Final   Culture  Setup Time     Final   Value: 03/08/2013 00:40     Performed at [facility]   Culture     Final   Value:        BLOOD CULTURE RECEIVED NO GROWTH TO DATE CULTURE WILL BE HELD FOR 5 DAYS BEFORE ISSUING A FINAL NEGATIVE REPORT     Performed at [facility]   Report Status PENDING   Incomplete  CULTURE, BLOOD (ROUTINE X 2)     Status: None   Collection Time    03/07/13  7:53 PM      Result Value Ref Range Status   Specimen Description BLOOD RIGHT HAND   Final   Special Requests BOTTLES DRAWN AEROBIC ONLY [place]   Final  Culture  Setup Time     Final   Value: 03/08/2013 00:40     Performed at [facility]   Culture     Final   Value:        BLOOD CULTURE RECEIVED NO GROWTH TO DATE CULTURE WILL BE HELD FOR 5 DAYS BEFORE ISSUING A FINAL NEGATIVE REPORT     Performed at [facility]   Report Status PENDING   Incomplete    Coagulation  Studies:  Recent Labs  03/07/13 2000  LABPROT 16.1*  INR 1.32    Imaging: Dg Chest Port 1 View  03/10/2013   CLINICAL DATA:  Respiratory failure  EXAM: PORTABLE CHEST - 1 VIEW  COMPARISON:  Prior radiograph from 03/09/2013  FINDINGS: Endotracheal tube terminates well above the carina. Enteric tube courses into the abdomen with tip located in the gastric fundus. Left IJ central venous catheter tip overlies the distal SVC. Cardiac and mediastinal silhouettes unchanged. Median sternotomy wires remain intact.  The lungs are normally inflated. There has been interval improvement in previously seen confluent opacity within the right perihilar region. No overt pulmonary edema. No new focal infiltrate or pneumothorax. No pleural effusion.  IMPRESSION: 1. Lines and tubes in satisfactory position. 2. Interval improvement in the hazy opacity overlying the right mid lung. No new focal infiltrate or overt pulmonary edema identified.   Electronically Signed   By: Jeannine Boga M.D.   On: 03/10/2013 06:09   Dg Chest Port 1 View  03/09/2013   CLINICAL DATA:  Respiratory failure.  EXAM: PORTABLE CHEST - 1 VIEW  COMPARISON:  03/07/2013  FINDINGS: Endotracheal tube has tip approximately 3.5 cm above the carina. Enteric tube courses into the region of the stomach and off the inferior portion of the film. Left IJ central venous catheter is unchanged with tip over the SVC. Lungs are adequately inflated with slight worsening hazy opacification over the central right lung. No evidence of effusion or pneumothorax. Cardiomediastinal silhouette and remainder the exam is unchanged.  IMPRESSION: Slight worsening hazy opacification over the central right lung which may be due to asymmetric edema or infection.  Tubes and lines as described.   Electronically Signed   By: Marin Olp M.D.   On: 03/09/2013 08:46    Medications:  I have reviewed the patient's current medications. Scheduled: . antiseptic oral rinse  15 mL  Mouth Rinse QID  . aspirin  324 mg Per Tube Daily  . chlorhexidine  15 mL Mouth Rinse BID  . cisatracurium  0.1 mg/kg Intravenous Once  . heparin subcutaneous  5,000 Units Subcutaneous 3 times per day  . insulin aspart  0-9 Units Subcutaneous 6 times per day  . metoprolol tartrate  25 mg Oral BID  . pantoprazole (PROTONIX) IV  40 mg Intravenous QHS    Assessment/Plan: Patient has not returned to baseline.  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  Patient will require further imaging.  EEG was only significant for slowing.    Recommendations: 1.  Once patient more stable to have MRI of the brain.   LOS: 3 days   Alexis Goodell, MD [facility] [phone] 03/10/2013  1:38 PM

## 2013-03-10 NOTE — Progress Notes (Signed)
Name: Eric [name] MRN: [id number] DOB: 04-08-1955    ADMISSION DATE:  03/07/2013    REFERRING MD :  EDP PRIMARY SERVICE: PCCM  CHIEF COMPLAINT: Post arrest   BRIEF PATIENT DESCRIPTION:  58 yo with witnessed sz at [employer]. Apneic and pulseless at scene. CPR x 5 minutes, king airway placed by EMS, intubated in ED. 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    SIGNIFICANT EVENTS / STUDIES:  2/13 CT head:  Hydrocephalus, with diffuse dilatation of the lateral and third ventricles. 2/13 Cards consult Terrence Dupont) and LHC: Cardiac cath with severe native vessel coronary artery disease but patent SVGs and LIMA 2/13 Neuro Consult: MRI and EEG recommended 2/14 EEG: This is an abnormal EEG due to a discontinuous slow background. No epileptiform activity is noted. This finding is consistent with the patient's current medications.  2/14 Amiodarone discontinued due to sinus bradycardia 03/09/13: Rewarming started last night. Not yet @ target temp. Episode of VT last PM   LINES / TUBES: R fem CVL 2/13 >> 2/14 ETT 2/13 >> L IJ CVL 2/13 >> R fem A-line 2/13 >>   CULTURES: Urine 2/13 >> NEG Blood 2/13 >>   ANTIBIOTICS:    SUBJECTIVE:   03/10/13: rewarmed. Now febrile. Off diprivan: opened eyes and moved all 4s. Got agitated off dipoirvan  VITAL SIGNS: Temp:  [96.1 F (35.6 C)-102.2 F (39 C)] 100.6 F (38.1 C) (02/16 0800) Pulse Rate:  [63-103] 87 (02/16 0800) Resp:  [0-23] 14 (02/16 0800) BP: (75-171)/(40-89) 88/50 mmHg (02/16 0800) SpO2:  [96 %-100 %] 98 % (02/16 0800) Arterial Line BP: (87-197)/(43-89) 92/51 mmHg (02/16 0800) FiO2 (%):  [30 %] 30 % (02/16 0406) Weight:  [77 kg (169 lb 12.1 oz)] 77 kg (169 lb 12.1 oz) (02/16 0400) HEMODYNAMICS: CVP:  [6 mmHg] 6 mmHg VENTILATOR SETTINGS: Vent Mode:  [-] PRVC FiO2 (%):  [30 %] 30 % Set Rate:  [12 bmp] 12 bmp Vt Set:  [600 mL] 600 mL PEEP:  [5 cmH20] 5 cmH20 Plateau Pressure:  [18 cmH20-21 cmH20] 20  cmH20 INTAKE / OUTPUT: Intake/Output     02/15 0701 - 02/16 0700 02/16 0701 - 02/17 0700   I.V. (mL/kg) 1100.8 (14.3)    IV Piggyback     Total Intake(mL/kg) 1100.8 (14.3)    Urine (mL/kg/hr) 850 (0.5)    Total Output 850     Net +250.8            PHYSICAL EXAMINATION: General: sedated, intubated Neuro: unable due to NMB HEENT: pupils symmetric Cardiovascular:  Loletha Grayer, regular, no M Lungs:  Clear anteriorly, no wheezes Abdomen: Soft, nondistended Ext: no edema  PULMONARY  Recent Labs Lab 03/07/13 1152 03/07/13 1302 03/07/13 1533 03/07/13 1705 03/07/13 2314 03/08/13 0426  PHART  --  7.380 7.261* 7.408  --  7.407  PCO2ART  --  46.2* 42.4 43.1  --  36.8  PO2ART  --  236.0* 74.0* 282.0*  --  180.0*  HCO3  --  27.4* 19.1* 27.2*  --  23.8  TCO2 24 29 20 28 21  25.2  O2SAT  --  100.0 92.0 100.0  --  99.6    CBC  Recent Labs Lab 03/08/13 0400 03/09/13 0400 03/10/13 0400  HGB 11.7* 11.0* 9.2*  HCT 33.1* 30.9* 26.9*  WBC 13.4* 7.4 7.6  PLT 167 130* 146*    COAGULATION  Recent Labs Lab 03/07/13 1136 03/07/13 1219 03/07/13 2000  INR 1.21 1.28 1.32    CARDIAC  Recent Labs Lab 03/07/13 1136  TROPONINI <0.30   No results found for this basename: PROBNP,  in the last 168 hours   CHEMISTRY  Recent Labs Lab 03/07/13 1152 03/07/13 1219  03/08/13 2000 03/09/13 0015 03/09/13 0400 03/09/13 0750 03/10/13 0400  NA 141 136*  < > 138 138 142 142 142  K 3.8 3.9  < > 3.4* 3.9 4.0 4.1 4.2  CL 100 99  < > 105 104 108 107 109  CO2  --  21  < > 22 21 21 22 22   GLUCOSE 212* 229*  < > 93 105* 118* 134* 155*  BUN 18 19  < > 20 21 20 21  24*  CREATININE 1.00 1.06  < > 0.91 0.96 0.98 1.07 1.65*  CALCIUM  --  8.5  < > 7.9* 8.0* 8.0* 8.1* 7.7*  MG  --  1.5  --   --  1.6  --   --   --   PHOS  --  3.8  --   --   --   --   --   --   < > = values in this interval not displayed. Estimated Creatinine Clearance: 52.5 ml/min (by C-G formula based on Cr of  1.65).   LIVER  Recent Labs Lab 03/07/13 1136 03/07/13 1219 03/07/13 2000 03/09/13 0400  AST 49* 45*  --  71*  ALT 59* 54*  --  71*  ALKPHOS 61 59  --  50  BILITOT 0.3 0.4  --  0.6  PROT 8.2 7.6  --  7.0  ALBUMIN 2.6* 2.3*  --  2.0*  INR 1.21 1.28 1.32  --      INFECTIOUS  Recent Labs Lab 03/07/13 1219  LATICACIDVEN 2.7*  PROCALCITON <0.10     ENDOCRINE CBG (last 3)   Recent Labs  03/09/13 1936 03/09/13 2349 03/10/13 0437  GLUCAP 122* 115* 137*         IMAGING x48h  Dg Chest Port 1 View  03/10/2013   CLINICAL DATA:  Respiratory failure  EXAM: PORTABLE CHEST - 1 VIEW  COMPARISON:  Prior radiograph from 03/09/2013  FINDINGS: Endotracheal tube terminates well above the carina. Enteric tube courses into the abdomen with tip located in the gastric fundus. Left IJ central venous catheter tip overlies the distal SVC. Cardiac and mediastinal silhouettes unchanged. Median sternotomy wires remain intact.  The lungs are normally inflated. There has been interval improvement in previously seen confluent opacity within the right perihilar region. No overt pulmonary edema. No new focal infiltrate or pneumothorax. No pleural effusion.  IMPRESSION: 1. Lines and tubes in satisfactory position. 2. Interval improvement in the hazy opacity overlying the right mid lung. No new focal infiltrate or overt pulmonary edema identified.   Electronically Signed   By: Jeannine Boga M.D.   On: 03/10/2013 06:09   Dg Chest Port 1 View  03/09/2013   CLINICAL DATA:  Respiratory failure.  EXAM: PORTABLE CHEST - 1 VIEW  COMPARISON:  03/07/2013  FINDINGS: Endotracheal tube has tip approximately 3.5 cm above the carina. Enteric tube courses into the region of the stomach and off the inferior portion of the film. Left IJ central venous catheter is unchanged with tip over the SVC. Lungs are adequately inflated with slight worsening hazy opacification over the central right lung. No evidence of  effusion or pneumothorax. Cardiomediastinal silhouette and remainder the exam is unchanged.  IMPRESSION: Slight worsening hazy opacification over the central right lung which may be due  to asymmetric edema or infection.  Tubes and lines as described.   Electronically Signed   By: Marin Olp M.D.   On: 03/09/2013 08:46       ASSESSMENT / PLAN:  PULMONARY A: VDRF post arrest  2/16: does not meet sbt criteria due to mental status P:   Full vent support Vent settings reviewed and adjusted Cont vent bundle Daily SBT as indicated  CARDIOVASCULAR A: VF arrest - ? Primary event or secondary to prolonged seizure Sinus bradycardia on amiodarone Hypertension Recurrent VT off amiodarone  03/09/13: Off amio. In Sinus. Not on pressors  P:  Cont PRN hydralazine to maintain MAP 85-105 mmHg Will leave decision re: restart of amiodarone to Cards (they did not comment on this 03/09/13, need to reclarify) Systolic range [id number] and MAP > 65  RENAL A:  Nil acute  P:   Monitor BMET intermittently Monitor I/Os Correct electrolytes as indicated   GASTROINTESTINAL A:  No issues P:   SUP: PPI Start tube feeds  HEMATOLOGIC A: ANemia of critical illness Mild thrombocytopenia P:  DVT px: SQ heparin Monitor CBC intermittently PRBC for hgb < 8gm% due to cardiac arrest  INFECTIOUS A:  Temp 101F post rewarming  P:   Pan culture PCT, lactate Hold off on antibiotics for now   ENDOCRINE A: DM2, controlled P:   Holding home glipizide and metformin Cont SSI  NEUROLOGIC A:   Hydrocephalus, uncertain chronicity Post anoxic encephalopathy Seizures - ? New onset  03/09/13: Agitated on WUA  P:   MRI ordered after rewarmed; will let neuro decide Reassess after rewarming Neuro following Guarded prognosis    GLOBAL 03/09/13: Family not updated  The patient is critically ill with multiple organ systems failure and requires high complexity decision making for assessment and  support, frequent evaluation and titration of therapies, application of advanced monitoring technologies and extensive interpretation of multiple databases.   Critical Care Time devoted to patient care services described in this note is  35  Minutes.  Dr. Brand Males, M.D., [facility]C.P Pulmonary and Critical Care Medicine Staff Physician [facility] Pager: [phone], If no answer or between  15:00h - 7:00h: call [phone]  03/10/2013 9:13 AM

## 2013-03-10 NOTE — Progress Notes (Signed)
100 cc of 250 cc bag of Fentanyl wasted in sink; Verified and witnessed by 2 RN's

## 2013-03-10 NOTE — Progress Notes (Signed)
NUTRITION FOLLOW UP  Intervention:   1.  Enteral nutrition; initiate Vital High Protein @ 20 mL/hr continuous.  Advance by 10 mL q 4 hrs to 45 mL/hr goal to provide 1080 kcal, 95g protein, 875 mL free water.  Total nutrition provision TF + propofol= 1721 kcal, 95g protein, 875 mL free water.  Nutrition Dx:   Inadequate oral intake, ongoing  Monitor:   1.  Enteral nutrition; initiation with tolerance.  Pt to meet >/=90% estimated needs with nutrition support.  2.  Wt/wt change; monitor trends  Assessment:   PMHx significant for HTN, HLD, DM. Admitted s/p witness seizure and cardiac arrest at [employer], required CPR x 5 minutes, pt was intubated in ED and coded again while there. Pt went to cath lab urgently on admission. Findings reveal severe native vessl CAD.   Patient is currently intubated on ventilator support.  Pt has been re-warmed. MV: 10.4 L/min Temp (24hrs), Avg:101 F (38.3 C), Min:99.5 F (37.5 C), Max:102.2 F (39 C)  Propofol: 24.3 ml/hr providing 641 kcal/d  Nutrition Focused Physical Exam:  Subcutaneous Fat:  Orbital Region: WNL Upper Arm Region: WNL Thoracic and Lumbar Region: WNL  Muscle:  Temple Region: mild wasting Clavicle Bone Region: WNL Clavicle and Acromion Bone Region: WNL Scapular Bone Region: WNL Dorsal Hand: WNL Patellar Region: mild wasting Anterior Thigh Region: mild wasting Posterior Calf Region: WNL  Edema: none present  No family at bedside today.   Height: Ht Readings from Last 1 Encounters:  03/07/13 6' (1.829 m)    Weight Status:   Wt Readings from Last 1 Encounters:  03/10/13 169 lb 12.1 oz (77 kg)   Estimated body mass index is 23.02 kg/(m^2) as calculated from the following:   Height as of this encounter: 6' (1.829 m).   Weight as of this encounter: 169 lb 12.1 oz (77 kg).   Re-estimated needs:  Kcal: 1779 Protein: 95-110g Fluid: ~2.2 L/day  Skin: intact  Diet Order:  NPO   Intake/Output Summary (Last 24  hours) at 03/10/13 1413 Last data filed at 03/10/13 1300  Gross per 24 hour  Intake 1065.18 ml  Output   1085 ml  Net -19.82 ml    Last BM: 2/13   Labs:   Recent Labs Lab 03/07/13 1152 03/07/13 1219  03/09/13 0015 03/09/13 0400 03/09/13 0750 03/10/13 0400  NA 141 136*  < > 138 142 142 142  K 3.8 3.9  < > 3.9 4.0 4.1 4.2  CL 100 99  < > 104 108 107 109  CO2  --  21  < > 21 21 22 22   BUN 18 19  < > 21 20 21  24*  CREATININE 1.00 1.06  < > 0.96 0.98 1.07 1.65*  CALCIUM  --  8.5  < > 8.0* 8.0* 8.1* 7.7*  MG  --  1.5  --  1.6  --   --   --   PHOS  --  3.8  --   --   --   --   --   GLUCOSE 212* 229*  < > 105* 118* 134* 155*  < > = values in this interval not displayed.  CBG (last 3)   Recent Labs  03/09/13 2349 03/10/13 0437 03/10/13 1126  GLUCAP 115* 137* 112*    Scheduled Meds: . antiseptic oral rinse  15 mL Mouth Rinse QID  . aspirin  324 mg Per Tube Daily  . chlorhexidine  15 mL Mouth Rinse BID  .  cisatracurium  0.1 mg/kg Intravenous Once  . heparin subcutaneous  5,000 Units Subcutaneous 3 times per day  . insulin aspart  0-9 Units Subcutaneous 6 times per day  . metoprolol tartrate  25 mg Oral BID  . pantoprazole (PROTONIX) IV  40 mg Intravenous QHS    Continuous Infusions: . sodium chloride 10 mL/hr at 03/09/13 0800  . sodium chloride 10 mL/hr at 03/09/13 0800  . cisatracurium (NIMBEX) infusion Stopped (03/09/13 1000)  . norepinephrine (LEVOPHED) Adult infusion Stopped (03/10/13 0646)  . propofol 50 mcg/kg/min (03/10/13 1100)    Brynda Greathouse, MS RD LDN Clinical Inpatient Dietitian Pager: [phone] Weekend/After hours pager: [phone]

## 2013-03-11 ENCOUNTER — Inpatient Hospital Stay (HOSPITAL_COMMUNITY): Payer: Medicaid Other

## 2013-03-11 LAB — PHOSPHORUS: Phosphorus: 4.1 mg/dL (ref 2.3–4.6)

## 2013-03-11 LAB — CBC WITH DIFFERENTIAL/PLATELET
BASOS ABS: 0 10*3/uL (ref 0.0–0.1)
Basophils Relative: 0 % (ref 0–1)
EOS ABS: 0 10*3/uL (ref 0.0–0.7)
EOS PCT: 0 % (ref 0–5)
HCT: 29.5 % — ABNORMAL LOW (ref 39.0–52.0)
Hemoglobin: 10.2 g/dL — ABNORMAL LOW (ref 13.0–17.0)
Lymphocytes Relative: 14 % (ref 12–46)
Lymphs Abs: 1 10*3/uL (ref 0.7–4.0)
MCH: 31.5 pg (ref 26.0–34.0)
MCHC: 34.6 g/dL (ref 30.0–36.0)
MCV: 91 fL (ref 78.0–100.0)
MONO ABS: 0.4 10*3/uL (ref 0.1–1.0)
Monocytes Relative: 5 % (ref 3–12)
Neutro Abs: 5.6 10*3/uL (ref 1.7–7.7)
Neutrophils Relative %: 81 % — ABNORMAL HIGH (ref 43–77)
Platelets: 143 10*3/uL — ABNORMAL LOW (ref 150–400)
RBC: 3.24 MIL/uL — ABNORMAL LOW (ref 4.22–5.81)
RDW: 13.3 % (ref 11.5–15.5)
WBC: 6.9 10*3/uL (ref 4.0–10.5)

## 2013-03-11 LAB — GLUCOSE, CAPILLARY
Glucose-Capillary: 114 mg/dL — ABNORMAL HIGH (ref 70–99)
Glucose-Capillary: 116 mg/dL — ABNORMAL HIGH (ref 70–99)
Glucose-Capillary: 149 mg/dL — ABNORMAL HIGH (ref 70–99)
Glucose-Capillary: 158 mg/dL — ABNORMAL HIGH (ref 70–99)
Glucose-Capillary: 170 mg/dL — ABNORMAL HIGH (ref 70–99)
Glucose-Capillary: 173 mg/dL — ABNORMAL HIGH (ref 70–99)

## 2013-03-11 LAB — LACTIC ACID, PLASMA: Lactic Acid, Venous: 1.7 mmol/L (ref 0.5–2.2)

## 2013-03-11 LAB — TROPONIN I

## 2013-03-11 LAB — BASIC METABOLIC PANEL
BUN: 27 mg/dL — AB (ref 6–23)
CALCIUM: 8.2 mg/dL — AB (ref 8.4–10.5)
CO2: 20 mEq/L (ref 19–32)
Chloride: 107 mEq/L (ref 96–112)
Creatinine, Ser: 1.56 mg/dL — ABNORMAL HIGH (ref 0.50–1.35)
GFR calc non Af Amer: 47 mL/min — ABNORMAL LOW (ref >90)
GFR, EST AFRICAN AMERICAN: 54 mL/min — AB (ref >90)
Glucose, Bld: 160 mg/dL — ABNORMAL HIGH (ref 70–99)
POTASSIUM: 4.2 meq/L (ref 3.7–5.3)
Sodium: 142 mEq/L (ref 137–147)

## 2013-03-11 LAB — PRO B NATRIURETIC PEPTIDE: Pro B Natriuretic peptide (BNP): 370.5 pg/mL — ABNORMAL HIGH (ref 0–125)

## 2013-03-11 LAB — MAGNESIUM: Magnesium: 2.2 mg/dL (ref 1.5–2.5)

## 2013-03-11 LAB — PROCALCITONIN: PROCALCITONIN: 6.28 ng/mL

## 2013-03-11 MED ORDER — METOPROLOL TARTRATE 50 MG PO TABS
50.0000 mg | ORAL_TABLET | Freq: Two times a day (BID) | ORAL | Status: DC
  Administered 2013-03-11 – 2013-03-12 (×2): 50 mg via ORAL
  Filled 2013-03-11: qty 2
  Filled 2013-03-11 (×3): qty 1

## 2013-03-11 MED ORDER — METOPROLOL TARTRATE 50 MG PO TABS
50.0000 mg | ORAL_TABLET | Freq: Once | ORAL | Status: AC
  Administered 2013-03-11: 50 mg
  Filled 2013-03-11: qty 2
  Filled 2013-03-11: qty 1

## 2013-03-11 MED ORDER — VANCOMYCIN HCL IN DEXTROSE 750-5 MG/150ML-% IV SOLN
750.0000 mg | Freq: Two times a day (BID) | INTRAVENOUS | Status: DC
  Administered 2013-03-11 – 2013-03-13 (×4): 750 mg via INTRAVENOUS
  Filled 2013-03-11 (×5): qty 150

## 2013-03-11 MED ORDER — SODIUM CHLORIDE 0.9 % IV BOLUS (SEPSIS)
1000.0000 mL | Freq: Once | INTRAVENOUS | Status: AC
  Administered 2013-03-11: 1000 mL via INTRAVENOUS

## 2013-03-11 MED ORDER — VANCOMYCIN HCL 10 G IV SOLR
1500.0000 mg | Freq: Once | INTRAVENOUS | Status: AC
  Administered 2013-03-11: 1500 mg via INTRAVENOUS
  Filled 2013-03-11: qty 1500

## 2013-03-11 MED ORDER — PIPERACILLIN-TAZOBACTAM 3.375 G IVPB
3.3750 g | Freq: Three times a day (TID) | INTRAVENOUS | Status: DC
  Administered 2013-03-11 – 2013-03-13 (×7): 3.375 g via INTRAVENOUS
  Filled 2013-03-11 (×8): qty 50

## 2013-03-11 NOTE — Consult Note (Signed)
ANTIBIOTIC CONSULT NOTE - INITIAL  Pharmacy Consult for vancomycin/zosyn Indication: pneumonia  No Known Allergies  Patient Measurements: Height: 6' (182.9 cm) Weight: 167 lb 15.9 oz (76.2 kg) IBW/kg (Calculated) : 77.6  Vital Signs: Temp: 99.1 F (37.3 C) (02/17 0900) Temp src: Core (Comment) (02/17 0800) BP: 91/57 mmHg (02/17 0900) Pulse Rate: 72 (02/17 0900) Intake/Output from previous day: 02/16 0701 - 02/17 0700 In: 1376.3 [I.V.:1020; NG/GT:356.3] Out: 920 [Urine:920] Intake/Output from this shift: Total I/O In: 190.8 [I.V.:41.8; NG/GT:99; IV Piggyback:50] Out: 125 [Urine:125]  Labs:  Recent Labs  03/09/13 0400 03/09/13 0750 03/10/13 0400 03/11/13 0344  WBC 7.4  --  7.6 6.9  HGB 11.0*  --  9.2* 10.2*  PLT 130*  --  146* 143*  CREATININE 0.98 1.07 1.65* 1.56*   Estimated Creatinine Clearance: 55 ml/min (by C-G formula based on Cr of 1.56). No results found for this basename: Letta Median, VANCORANDOM, GENTTROUGH, GENTPEAK, GENTRANDOM, TOBRATROUGH, TOBRAPEAK, TOBRARND, AMIKACINPEAK, AMIKACINTROU, AMIKACIN,  in the last 72 hours   Microbiology: Recent Results (from the past 720 hour(s))  URINE CULTURE     Status: None   Collection Time    03/07/13 12:19 PM      Result Value Ref Range Status   Specimen Description URINE, CATHETERIZED   Final   Special Requests NONE   Final   Culture  Setup Time     Final   Value: 03/07/2013 13:23     Performed at [place]     Final   Value: NO GROWTH     Performed at [facility]   Culture     Final   Value: NO GROWTH     Performed at [facility]   Report Status 03/08/2013 FINAL   Final  MRSA PCR SCREENING     Status: None   Collection Time    03/07/13  2:35 PM      Result Value Ref Range Status   MRSA by PCR NEGATIVE  NEGATIVE Final   Comment:            The GeneXpert MRSA Assay (FDA     approved for NASAL specimens     only), is one component of a   comprehensive MRSA colonization     surveillance program. It is not     intended to diagnose MRSA     infection nor to guide or     monitor treatment for     MRSA infections.  CULTURE, BLOOD (ROUTINE X 2)     Status: None   Collection Time    03/07/13  7:42 PM      Result Value Ref Range Status   Specimen Description BLOOD LEFT HAND   Final   Special Requests BOTTLES DRAWN AEROBIC ONLY 1CC   Final   Culture  Setup Time     Final   Value: 03/08/2013 00:40     Performed at [facility]   Culture     Final   Value:        BLOOD CULTURE RECEIVED NO GROWTH TO DATE CULTURE WILL BE HELD FOR 5 DAYS BEFORE ISSUING A FINAL NEGATIVE REPORT     Performed at [facility]   Report Status PENDING   Incomplete  CULTURE, BLOOD (ROUTINE X 2)     Status: None   Collection Time    03/07/13  7:53 PM      Result Value Ref Range Status  Specimen Description BLOOD RIGHT HAND   Final   Special Requests BOTTLES DRAWN AEROBIC ONLY 1CC   Final   Culture  Setup Time     Final   Value: 03/08/2013 00:40     Performed at [facility]   Culture     Final   Value:        BLOOD CULTURE RECEIVED NO GROWTH TO DATE CULTURE WILL BE HELD FOR 5 DAYS BEFORE ISSUING A FINAL NEGATIVE REPORT     Performed at [facility]   Report Status PENDING   Incomplete  CULTURE, RESPIRATORY (NON-EXPECTORATED)     Status: None   Collection Time    03/10/13 11:30 AM      Result Value Ref Range Status   Specimen Description TRACHEAL ASPIRATE   Final   Special Requests NONE   Final   Gram Stain     Final   Value: ABUNDANT WBC PRESENT, PREDOMINANTLY PMN     RARE SQUAMOUS EPITHELIAL CELLS PRESENT     ABUNDANT GRAM VARIABLE ROD     FEW GRAM POSITIVE COCCI     IN CLUSTERS   Culture PENDING   Incomplete   Report Status PENDING   Incomplete  CULTURE, BLOOD (ROUTINE X 2)     Status: None   Collection Time    03/10/13 11:42 AM      Result Value Ref Range Status   Specimen Description BLOOD LEFT  ANTECUBITAL   Final   Special Requests BOTTLES DRAWN AEROBIC AND ANAEROBIC 10CC   Final   Culture  Setup Time     Final   Value: 03/10/2013 16:16     Performed at [facility]   Culture     Final   Value:        BLOOD CULTURE RECEIVED NO GROWTH TO DATE CULTURE WILL BE HELD FOR 5 DAYS BEFORE ISSUING A FINAL NEGATIVE REPORT     Performed at [facility]   Report Status PENDING   Incomplete  CULTURE, BLOOD (ROUTINE X 2)     Status: None   Collection Time    03/10/13 11:50 AM      Result Value Ref Range Status   Specimen Description BLOOD LEFT HAND   Final   Special Requests BOTTLES DRAWN AEROBIC ONLY 10CC   Final   Culture  Setup Time     Final   Value: 03/10/2013 16:16     Performed at [facility]   Culture     Final   Value:        BLOOD CULTURE RECEIVED NO GROWTH TO DATE CULTURE WILL BE HELD FOR 5 DAYS BEFORE ISSUING A FINAL NEGATIVE REPORT     Performed at [facility]   Report Status PENDING   Incomplete    Medical History: Past Medical History  Diagnosis Date  . Hypertension   . Hyperlipemia   . 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 Edema     Medications:  Scheduled:  . antiseptic oral rinse  15 mL Mouth Rinse QID  . aspirin  324 mg Per Tube Daily  . chlorhexidine  15 mL Mouth Rinse BID  . cisatracurium  0.1 mg/kg Intravenous Once  . feeding supplement (VITAL HIGH PROTEIN)  1,000 mL Per Tube Q24H  . heparin subcutaneous  5,000 Units Subcutaneous 3 times per day  . insulin aspart  0-9 Units Subcutaneous 6 times per day  . metoprolol tartrate  25 mg Oral BID  . pantoprazole (PROTONIX) IV  40 mg Intravenous QHS  . piperacillin-tazobactam (ZOSYN)  IV  3.375 g Intravenous Q8H  . vancomycin  1,500 mg Intravenous Once  . vancomycin  750 mg Intravenous Q12H   Assessment: 58 yo M admitted s/p witnessed seizure who then became pulseless, apneic, and CPR was performed for 5 minutes. Patient s/p hypothermia protocol.  Pharmacy is now consulted to dose vanc/zosyn for pneumonia.  SCr is elevated though perhaps at a plateau at 1.56. CrCl ~55 WBC 6.9. Afebrile currently, though Tmax 102.4.  2/16 blood >> ngtd 2/16 trach aspirate >> pending 2/16 urine >> sent   Goal of Therapy:  Vancomycin trough level 15-20 mcg/ml  Plan:  -Start zosyn 3.375g IV q 8 hours -Give vancomycin 1500 mg IV x 1 dose -Start vancomycin 750 mg IV q 12 hours tonight -Follow renal function, WBC, fever trend, clinical progression, trough at stead state as indicated  Nikiya Starn C. Gaylynn Seiple, PharmD Clinical Pharmacist-Resident Pager: [phone] Pharmacy: [phone] 03/11/2013 9:41 AM

## 2013-03-11 NOTE — Progress Notes (Signed)
Subjective:  Patient remains intubated sedated. Blood pressure remains labile  Objective:  Vital Signs in the last 24 hours: Temp:  [97.9 F (36.6 C)-102.4 F (39.1 C)] 97.9 F (36.6 C) (02/17 1200) Pulse Rate:  [72-104] 78 (02/17 1200) Resp:  [0-32] 0 (02/17 1200) BP: (91-178)/(48-103) 157/83 mmHg (02/17 1200) SpO2:  [93 %-100 %] 99 % (02/17 1200) Arterial Line BP: (104-189)/(52-86) 178/79 mmHg (02/17 1200) FiO2 (%):  [30 %] 30 % (02/17 1200) Weight:  [76.2 kg (167 lb 15.9 oz)] 76.2 kg (167 lb 15.9 oz) (02/17 0600)  Intake/Output from previous day: 02/16 0701 - 02/17 0700 In: 1376.3 [I.V.:1020; NG/GT:356.3] Out: 920 [Urine:920] Intake/Output from this shift: Total I/O In: 928.3 [I.V.:153.3; NG/GT:225; IV Piggyback:550] Out: 125 [Urine:125]  Physical Exam: Neck: no adenopathy, no carotid bruit, no JVD and supple, symmetrical, trachea midline Lungs: Decreased breath sound at bases with occasional rhonchi Heart: regular rate and rhythm, S1, S2 normal and Soft systolic murmur noted Abdomen: soft, non-tender; bowel sounds normal; no masses,  no organomegaly Extremities: extremities normal, atraumatic, no cyanosis or edema  Lab Results:  Recent Labs  03/10/13 0400 03/11/13 0344  WBC 7.6 6.9  HGB 9.2* 10.2*  PLT 146* 143*    Recent Labs  03/10/13 0400 03/11/13 0344  NA 142 142  K 4.2 4.2  CL 109 107  CO2 22 20  GLUCOSE 155* 160*  BUN 24* 27*  CREATININE 1.65* 1.56*    Recent Labs  03/11/13 0340  TROPONINI <0.30   Hepatic Function Panel  Recent Labs  03/09/13 0400  PROT 7.0  ALBUMIN 2.0*  AST 71*  ALT 71*  ALKPHOS 50  BILITOT 0.6   No results found for this basename: CHOL,  in the last 72 hours No results found for this basename: PROTIME,  in the last 72 hours  Imaging: Imaging results have been reviewed and Dg Chest Port 1 View  03/11/2013   CLINICAL DATA:  Check ETT position.  EXAM: PORTABLE CHEST - 1 VIEW  COMPARISON:  03/10/2013  FINDINGS:  Prior CABG. Support devices are stable. Slight increasing perihilar opacities and interstitial prominence, possibly interstitial edema. Heart is borderline in size. No effusions. No acute bony abnormality.  IMPRESSION: Increasing interstitial prominence and perihilar opacities concerning for mild edema.   Electronically Signed   By: Rolm Baptise M.D.   On: 03/11/2013 06:46   Dg Chest Port 1 View  03/10/2013   CLINICAL DATA:  Respiratory failure  EXAM: PORTABLE CHEST - 1 VIEW  COMPARISON:  Prior radiograph from 03/09/2013  FINDINGS: Endotracheal tube terminates well above the carina. Enteric tube courses into the abdomen with tip located in the gastric fundus. Left IJ central venous catheter tip overlies the distal SVC. Cardiac and mediastinal silhouettes unchanged. Median sternotomy wires remain intact.  The lungs are normally inflated. There has been interval improvement in previously seen confluent opacity within the right perihilar region. No overt pulmonary edema. No new focal infiltrate or pneumothorax. No pleural effusion.  IMPRESSION: 1. Lines and tubes in satisfactory position. 2. Interval improvement in the hazy opacity overlying the right mid lung. No new focal infiltrate or overt pulmonary edema identified.   Electronically Signed   By: Jeannine Boga M.D.   On: 03/10/2013 06:09    Cardiac Studies:  Assessment/Plan:  Status post recurrent V. fib cardiac arrest  Vent dependent respiratory failure secondary to above rule out aspiration  Coronary artery disease status post CABG  Hypertension  Diabetes mellitus  Hypercholesteremia  Status  post seizures  Hydrocephalus  Plan Increase beta blockers as per orders  LOS: 4 days    Eric [name] 03/11/2013, 12:39 PM

## 2013-03-11 NOTE — Progress Notes (Signed)
Subjective: Patient remains intubated and sedated.  No further seizure activity noted.    Objective: Current vital signs: BP 157/83  Pulse 78  Temp(Src) 97.9 F (36.6 C) (Core (Comment))  Resp 0  Ht 6' (1.829 m)  Wt 76.2 kg (167 lb 15.9 oz)  BMI 22.78 kg/m2  SpO2 99% Vital signs in last 24 hours: Temp:  [97.9 F (36.6 C)-102.4 F (39.1 C)] 97.9 F (36.6 C) (02/17 1200) Pulse Rate:  [72-104] 78 (02/17 1200) Resp:  [0-32] 0 (02/17 1200) BP: (91-178)/(48-103) 157/83 mmHg (02/17 1200) SpO2:  [93 %-100 %] 99 % (02/17 1200) Arterial Line BP: (104-189)/(52-86) 178/79 mmHg (02/17 1200) FiO2 (%):  [30 %] 30 % (02/17 1200) Weight:  [76.2 kg (167 lb 15.9 oz)] 76.2 kg (167 lb 15.9 oz) (02/17 0600)  Intake/Output from previous day: 02/16 0701 - 02/17 0700 In: 1376.3 [I.V.:1020; NG/GT:356.3] Out: 920 [Urine:920] Intake/Output this shift: Total I/O In: 1168.3 [I.V.:153.3; NG/GT:465; IV Piggyback:550] Out: 250 [Urine:250] Nutritional status:    Neurologic Exam: Mental Status:  Patient does not respond to verbal stimuli. With deep sternal rub grimaces. Does not follow commands. No verbalizations noted.  Cranial Nerves:  II: patient does not respond confrontation bilaterally, pupils right 3 mm, left 3 mm,and reactive bilaterally  III,IV,VI: doll's response absent bilaterally.  V,VII: corneal reflex reduced bilaterally  VIII: patient does not respond to verbal stimuli  IX,X: gag reflex absent, XI: trapezius strength unable to test bilaterally  XII: tongue strength unable to test  Motor:  Extremities flaccid throughout. No spontaneous movement noted. No purposeful movements noted.  Sensory:  Does not respond to noxious stimuli in any extremity.  Deep Tendon Reflexes:  Absent throughout.  Plantars:  mute bilaterally but withdraws both legs Cerebellar:  Unable to perform   Lab Results: Basic Metabolic Panel:  Recent Labs Lab 03/07/13 1152 03/07/13 1219  03/09/13 0015  03/09/13 0400 03/09/13 0750 03/10/13 0400 03/11/13 0344  NA 141 136*  < > 138 142 142 142 142  K 3.8 3.9  < > 3.9 4.0 4.1 4.2 4.2  CL 100 99  < > 104 108 107 109 107  CO2  --  21  < > 21 21 22 22 20   GLUCOSE 212* 229*  < > 105* 118* 134* 155* 160*  BUN 18 19  < > 21 20 21  24* 27*  CREATININE 1.00 1.06  < > 0.96 0.98 1.07 1.65* 1.56*  CALCIUM  --  8.5  < > 8.0* 8.0* 8.1* 7.7* 8.2*  MG  --  1.5  --  1.6  --   --   --  2.2  PHOS  --  3.8  --   --   --   --   --  4.1  < > = values in this interval not displayed.  Liver Function Tests:  Recent Labs Lab 03/07/13 1136 03/07/13 1219 03/09/13 0400  AST 49* 45* 71*  ALT 59* 54* 71*  ALKPHOS 61 59 50  BILITOT 0.3 0.4 0.6  PROT 8.2 7.6 7.0  ALBUMIN 2.6* 2.3* 2.0*   No results found for this basename: LIPASE, AMYLASE,  in the last 168 hours No results found for this basename: AMMONIA,  in the last 168 hours  CBC:  Recent Labs Lab 03/07/13 1136  03/07/13 1219 03/07/13 2314 03/08/13 0400 03/09/13 0400 03/10/13 0400 03/11/13 0344  WBC 6.0  --  6.4  --  13.4* 7.4 7.6 6.9  NEUTROABS 3.1  --   --   --   --   --   --  5.6  HGB 11.8*  < > 12.1* 11.2* 11.7* 11.0* 9.2* 10.2*  HCT 33.5*  < > 33.9* 33.0* 33.1* 30.9* 26.9* 29.5*  MCV 88.4  --  88.3  --  88.3 87.8 90.3 91.0  PLT 166  --  208  --  167 130* 146* 143*  < > = values in this interval not displayed.  Cardiac Enzymes:  Recent Labs Lab 03/07/13 1136 03/10/13 1130 03/11/13 0340  CKTOTAL  --  364*  --   CKMB  --  1.9  --   TROPONINI <0.30  --  <0.30    Lipid Panel:  Recent Labs Lab 03/07/13 1248 03/10/13 1130  TRIG 124 191*    CBG:  Recent Labs Lab 03/10/13 2020 03/10/13 2358 03/11/13 0325 03/11/13 0724 03/11/13 1212  GLUCAP 114* 116* 149* 170* 158*    Microbiology: Results for orders placed during the hospital encounter of 03/07/13  URINE CULTURE     Status: None   Collection Time    03/07/13 12:19 PM      Result Value Ref Range Status    Specimen Description URINE, CATHETERIZED   Final   Special Requests NONE   Final   Culture  Setup Time     Final   Value: 03/07/2013 13:23     Performed at [place]     Final   Value: NO GROWTH     Performed at [facility]   Culture     Final   Value: NO GROWTH     Performed at [facility]   Report Status 03/08/2013 FINAL   Final  MRSA PCR SCREENING     Status: None   Collection Time    03/07/13  2:35 PM      Result Value Ref Range Status   MRSA by PCR NEGATIVE  NEGATIVE Final   Comment:            The GeneXpert MRSA Assay (FDA     approved for NASAL specimens     only), is one component of a     comprehensive MRSA colonization     surveillance program. It is not     intended to diagnose MRSA     infection nor to guide or     monitor treatment for     MRSA infections.  CULTURE, BLOOD (ROUTINE X 2)     Status: None   Collection Time    03/07/13  7:42 PM      Result Value Ref Range Status   Specimen Description BLOOD LEFT HAND   Final   Special Requests BOTTLES DRAWN AEROBIC ONLY 1CC   Final   Culture  Setup Time     Final   Value: 03/08/2013 00:40     Performed at [facility]   Culture     Final   Value:        BLOOD CULTURE RECEIVED NO GROWTH TO DATE CULTURE WILL BE HELD FOR 5 DAYS BEFORE ISSUING A FINAL NEGATIVE REPORT     Performed at [facility]   Report Status PENDING   Incomplete  CULTURE, BLOOD (ROUTINE X 2)     Status: None   Collection Time    03/07/13  7:53 PM      Result Value Ref Range Status   Specimen Description BLOOD RIGHT HAND   Final   Special Requests BOTTLES DRAWN AEROBIC ONLY [place]   Final   Culture  Setup Time     Final   Value: 03/08/2013 00:40     Performed at [facility]   Culture     Final   Value:        BLOOD CULTURE RECEIVED NO GROWTH TO DATE CULTURE WILL BE HELD FOR 5 DAYS BEFORE ISSUING A FINAL NEGATIVE REPORT     Performed at [facility]   Report Status  PENDING   Incomplete  CULTURE, RESPIRATORY (NON-EXPECTORATED)     Status: None   Collection Time    03/10/13 11:30 AM      Result Value Ref Range Status   Specimen Description TRACHEAL ASPIRATE   Final   Special Requests NONE   Final   Gram Stain     Final   Value: ABUNDANT WBC PRESENT, PREDOMINANTLY PMN     RARE SQUAMOUS EPITHELIAL CELLS PRESENT     ABUNDANT GRAM VARIABLE ROD     FEW GRAM POSITIVE COCCI     IN CLUSTERS   Culture     Final   Value: Culture reincubated for better growth     Performed at [facility]   Report Status PENDING   Incomplete  CULTURE, BLOOD (ROUTINE X 2)     Status: None   Collection Time    03/10/13 11:42 AM      Result Value Ref Range Status   Specimen Description BLOOD LEFT ANTECUBITAL   Final   Special Requests BOTTLES DRAWN AEROBIC AND ANAEROBIC 10CC   Final   Culture  Setup Time     Final   Value: 03/10/2013 16:16     Performed at [facility]   Culture     Final   Value:        BLOOD CULTURE RECEIVED NO GROWTH TO DATE CULTURE WILL BE HELD FOR 5 DAYS BEFORE ISSUING A FINAL NEGATIVE REPORT     Performed at [facility]   Report Status PENDING   Incomplete  CULTURE, BLOOD (ROUTINE X 2)     Status: None   Collection Time    03/10/13 11:50 AM      Result Value Ref Range Status   Specimen Description BLOOD LEFT HAND   Final   Special Requests BOTTLES DRAWN AEROBIC ONLY 10CC   Final   Culture  Setup Time     Final   Value: 03/10/2013 16:16     Performed at [facility]   Culture     Final   Value:        BLOOD CULTURE RECEIVED NO GROWTH TO DATE CULTURE WILL BE HELD FOR 5 DAYS BEFORE ISSUING A FINAL NEGATIVE REPORT     Performed at [facility]   Report Status PENDING   Incomplete    Coagulation Studies: No results found for this basename: LABPROT, INR,  in the last 72 hours  Imaging: Dg Chest Port 1 View  03/11/2013   CLINICAL DATA:  Check ETT position.  EXAM: PORTABLE CHEST - 1 VIEW  COMPARISON:   03/10/2013  FINDINGS: Prior CABG. Support devices are stable. Slight increasing perihilar opacities and interstitial prominence, possibly interstitial edema. Heart is borderline in size. No effusions. No acute bony abnormality.  IMPRESSION: Increasing interstitial prominence and perihilar opacities concerning for mild edema.   Electronically Signed   By: Rolm Baptise M.D.   On: 03/11/2013 06:46   Dg Chest Port 1 View  03/10/2013   CLINICAL DATA:  Respiratory failure  EXAM: PORTABLE CHEST -  1 VIEW  COMPARISON:  Prior radiograph from 03/09/2013  FINDINGS: Endotracheal tube terminates well above the carina. Enteric tube courses into the abdomen with tip located in the gastric fundus. Left IJ central venous catheter tip overlies the distal SVC. Cardiac and mediastinal silhouettes unchanged. Median sternotomy wires remain intact.  The lungs are normally inflated. There has been interval improvement in previously seen confluent opacity within the right perihilar region. No overt pulmonary edema. No new focal infiltrate or pneumothorax. No pleural effusion.  IMPRESSION: 1. Lines and tubes in satisfactory position. 2. Interval improvement in the hazy opacity overlying the right mid lung. No new focal infiltrate or overt pulmonary edema identified.   Electronically Signed   By: Jeannine Boga M.D.   On: 03/10/2013 06:09    Medications:  I have reviewed the patient's current medications. Scheduled: . antiseptic oral rinse  15 mL Mouth Rinse QID  . aspirin  324 mg Per Tube Daily  . chlorhexidine  15 mL Mouth Rinse BID  . feeding supplement (VITAL HIGH PROTEIN)  1,000 mL Per Tube Q24H  . heparin subcutaneous  5,000 Units Subcutaneous 3 times per day  . insulin aspart  0-9 Units Subcutaneous 6 times per day  . metoprolol tartrate  50 mg Oral BID  . pantoprazole (PROTONIX) IV  40 mg Intravenous QHS  . piperacillin-tazobactam (ZOSYN)  IV  3.375 g Intravenous Q8H  . vancomycin  1,500 mg Intravenous Once  .  vancomycin  750 mg Intravenous Q12H    Assessment/Plan: Patient unchanged  Recommendations: 1.  MRI of brain pending   LOS: 4 days   Alexis Goodell, MD [facility] [phone] 03/11/2013  12:44 PM

## 2013-03-11 NOTE — Consult Note (Signed)
Reason for Consult:Recurrent VF arrest  Referring Physician: Dr. Marinda Elk [name] is an 58 y.o. male.   HPI: The patient is a 58 yo man with a h/o CAD and previously preserved LV function who was admitted with a cardiac arrest, CPR, unclear if he was shocked though presumed so by paramedics who then had a recurrent episode of VF requiring defibrillation in the ER. He has known CAD, s/p CABG and underwent left heart cath demonstrating severe native vessel disease and patent grafts. He has been cooled and intubated with trouble extubating because of difficulty with sedation. Post arrest, he was following commands. He currently is sedated and additional history cannot be obtained. He has ruled out for MI.   PMH: Past Medical History  Diagnosis Date  . Hypertension   . Hyperlipemia   . 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 Edema     PSHX:No past surgical history on file. See above  FAMHX:No family history on file. Unable to obtain  Social History:  has no tobacco, alcohol, and drug history on file.  Allergies: No Known Allergies  Medications: see medication list in chart. Reviewed  Dg Chest Port 1 View  03/11/2013   CLINICAL DATA:  Check ETT position.  EXAM: PORTABLE CHEST - 1 VIEW  COMPARISON:  03/10/2013  FINDINGS: Prior CABG. Support devices are stable. Slight increasing perihilar opacities and interstitial prominence, possibly interstitial edema. Heart is borderline in size. No effusions. No acute bony abnormality.  IMPRESSION: Increasing interstitial prominence and perihilar opacities concerning for mild edema.   Electronically Signed   By: Rolm Baptise M.D.   On: 03/11/2013 06:46   Dg Chest Port 1 View  03/10/2013   CLINICAL DATA:  Respiratory failure  EXAM: PORTABLE CHEST - 1 VIEW  COMPARISON:  Prior radiograph from 03/09/2013  FINDINGS: Endotracheal tube terminates well above the carina. Enteric tube courses into the abdomen with tip located in  the gastric fundus. Left IJ central venous catheter tip overlies the distal SVC. Cardiac and mediastinal silhouettes unchanged. Median sternotomy wires remain intact.  The lungs are normally inflated. There has been interval improvement in previously seen confluent opacity within the right perihilar region. No overt pulmonary edema. No new focal infiltrate or pneumothorax. No pleural effusion.  IMPRESSION: 1. Lines and tubes in satisfactory position. 2. Interval improvement in the hazy opacity overlying the right mid lung. No new focal infiltrate or overt pulmonary edema identified.   Electronically Signed   By: Jeannine Boga M.D.   On: 03/10/2013 06:09    ROS  As stated in the HPI and negative for all other systems.  Physical Exam  Vitals:Blood pressure 157/83, pulse 78, temperature 97.9 F (36.6 C), temperature source Core (Comment), resp. rate 0, height 6' (1.829 m), weight 167 lb 15.9 oz (76.2 kg), SpO2 99.00%.  Gen: intubated and sedated, does not answer commands. HEENT: Unremarkable Neck:  No JVD, no thyromegally Back:  No CVA tenderness Lungs:  Clear with no wheezes. HEART:  Regular rate rhythm, no murmurs, no rubs, no clicks Abd:  soft, positive bowel sounds, no organomegally, no rebound, no guarding Ext:  2 plus pulses, no edema, no cyanosis, no clubbing Skin:  No rashes no nodules Neuro:  CN II through XII intact, motor grossly intact  Assessment/Plan: 1. VF arrest 2. CAD, no MI 3. VDRF secondary to #1 4. HTN 5. DM Rec: I would attempt uptitration of his medical therapy including his beta blocker. Extubate as  able. Would not recommend anti-arrhythmic drug therapy at this point. He will likely need ICD though timing is still unclear. We might consider a life VEST. I suspect his ischemic heart disease is responsible for VF arrest. Would repeat 2D echo.  Eric [name],M.D.  Eric Overlie TaylorMD 03/11/2013, 12:59 PM

## 2013-03-11 NOTE — Progress Notes (Signed)
Pulmonary/Critical Care Progress Note   Name: Eric [name] MRN: [id number] DOB: 1955/03/22    ADMISSION DATE:  03/07/2013    REFERRING MD :  EDP PRIMARY SERVICE: PCCM  CHIEF COMPLAINT: Post arrest   BRIEF PATIENT DESCRIPTION:  58 yo with witnessed sz at [employer]. Apneic and pulseless at scene. CPR x 5 minutes, king airway placed by EMS, intubated in ED. 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    SIGNIFICANT EVENTS / STUDIES:  2/13 CT head:  Hydrocephalus, with diffuse dilatation of the lateral and third ventricles. 2/13 Cards consult Terrence Dupont) and LHC: Cardiac cath with severe native vessel coronary artery disease but patent SVGs and LIMA 2/13 Neuro Consult: MRI and EEG recommended 2/14 EEG: This is an abnormal EEG due to a discontinuous slow background. No epileptiform activity is noted. This finding is consistent with the patient's current medications.  2/14 Amiodarone discontinued due to sinus bradycardia 03/09/13: Rewarming started last night. Not yet @ target temp. Episode of VT last PM  LINES / TUBES: R fem CVL 2/13 >> 2/14 ETT 2/13 >> L IJ CVL 2/13 >> R fem A-line 2/13 >>   CULTURES: Urine 2/13 >> NEG Blood 2/13 >>   ANTIBIOTICS: Vancomycin 2/17>>> Zosyn 2/17>>>  SUBJECTIVE: No events overnight, agitated when off sedation.  VITAL SIGNS: Temp:  [99.1 F (37.3 C)-102.4 F (39.1 C)] 99.1 F (37.3 C) (02/17 0800) Pulse Rate:  [78-104] 95 (02/17 0800) Resp:  [11-32] 27 (02/17 0800) BP: (94-178)/(48-85) 153/79 mmHg (02/17 0800) SpO2:  [93 %-100 %] 98 % (02/17 0800) Arterial Line BP: (104-189)/(52-86) 179/76 mmHg (02/17 0800) FiO2 (%):  [30 %] 30 % (02/17 0800) Weight:  [76.2 kg (167 lb 15.9 oz)] 76.2 kg (167 lb 15.9 oz) (02/17 0600) HEMODYNAMICS: CVP:  [5 mmHg-7 mmHg] 5 mmHg VENTILATOR SETTINGS: Vent Mode:  [-] PRVC FiO2 (%):  [30 %] 30 % Set Rate:  [12 bmp] 12 bmp Vt Set:  [600 mL] 600 mL PEEP:  [5 cmH20] 5 cmH20 Pressure Support:  [10  cmH20] 10 cmH20 Plateau Pressure:  [15 cmH20-24 cmH20] 24 cmH20 INTAKE / OUTPUT: Intake/Output     02/16 0701 - 02/17 0700 02/17 0701 - 02/18 0700   I.V. (mL/kg) 1020 (13.4) 28.3 (0.4)   NG/GT 356.3 45   Total Intake(mL/kg) 1376.3 (18.1) 73.3 (1)   Urine (mL/kg/hr) 920 (0.5) 125 (1.1)   Total Output 920 125   Net +456.3 -51.8         PHYSICAL EXAMINATION: General: sedated, intubated Neuro: Agitated when off sedation, per nursing was moving all ext spontaneously. HEENT: pupils symmetric Cardiovascular:  Loletha Grayer, regular, no M Lungs:  Clear anteriorly, no wheezes Abdomen: Soft, nondistended Ext: no edema  PULMONARY  Recent Labs Lab 03/07/13 1152 03/07/13 1302 03/07/13 1533 03/07/13 1705 03/07/13 2314 03/08/13 0426  PHART  --  7.380 7.261* 7.408  --  7.407  PCO2ART  --  46.2* 42.4 43.1  --  36.8  PO2ART  --  236.0* 74.0* 282.0*  --  180.0*  HCO3  --  27.4* 19.1* 27.2*  --  23.8  TCO2 24 29 20 28 21  25.2  O2SAT  --  100.0 92.0 100.0  --  99.6   CBC  Recent Labs Lab 03/09/13 0400 03/10/13 0400 03/11/13 0344  HGB 11.0* 9.2* 10.2*  HCT 30.9* 26.9* 29.5*  WBC 7.4 7.6 6.9  PLT 130* 146* 143*   COAGULATION  Recent Labs Lab 03/07/13 1136 03/07/13 1219 03/07/13 2000  INR 1.21 1.28  1.[place] Lab 03/07/13 1136 03/11/13 0340  TROPONINI <0.30 <0.30   Recent Labs Lab 03/11/13 0340  PROBNP 370.5*   CHEMISTRY  Recent Labs Lab 03/07/13 1152 03/07/13 1219  03/09/13 0015 03/09/13 0400 03/09/13 0750 03/10/13 0400 03/11/13 0344  NA 141 136*  < > 138 142 142 142 142  K 3.8 3.9  < > 3.9 4.0 4.1 4.2 4.2  CL 100 99  < > 104 108 107 109 107  CO2  --  21  < > 21 21 22 22 20   GLUCOSE 212* 229*  < > 105* 118* 134* 155* 160*  BUN 18 19  < > 21 20 21  24* 27*  CREATININE 1.00 1.06  < > 0.96 0.98 1.07 1.65* 1.56*  CALCIUM  --  8.5  < > 8.0* 8.0* 8.1* 7.7* 8.2*  MG  --  1.5  --  1.6  --   --   --  2.2  PHOS  --  3.8  --   --   --   --   --  4.1   < > = values in this interval not displayed. Estimated Creatinine Clearance: 55 ml/min (by C-G formula based on Cr of 1.56).  LIVER  Recent Labs Lab 03/07/13 1136 03/07/13 1219 03/07/13 2000 03/09/13 0400  AST 49* 45*  --  71*  ALT 59* 54*  --  71*  ALKPHOS 61 59  --  50  BILITOT 0.3 0.4  --  0.6  PROT 8.2 7.6  --  7.0  ALBUMIN 2.6* 2.3*  --  2.0*  INR 1.21 1.28 1.32  --    INFECTIOUS  Recent Labs Lab 03/07/13 1219 03/10/13 1130 03/11/13 0344 03/11/13 0500  LATICACIDVEN 2.7* 0.9  --  1.7  PROCALCITON <0.10 11.11 6.28  --    ENDOCRINE CBG (last 3)   Recent Labs  03/10/13 2358 03/11/13 0325 03/11/13 0724  GLUCAP 116* 149* 170*   IMAGING x48h  Dg Chest Port 1 View  03/11/2013   CLINICAL DATA:  Check ETT position.  EXAM: PORTABLE CHEST - 1 VIEW  COMPARISON:  03/10/2013  FINDINGS: Prior CABG. Support devices are stable. Slight increasing perihilar opacities and interstitial prominence, possibly interstitial edema. Heart is borderline in size. No effusions. No acute bony abnormality.  IMPRESSION: Increasing interstitial prominence and perihilar opacities concerning for mild edema.   Electronically Signed   By: Rolm Baptise M.D.   On: 03/11/2013 06:46   Dg Chest Port 1 View  03/10/2013   CLINICAL DATA:  Respiratory failure  EXAM: PORTABLE CHEST - 1 VIEW  COMPARISON:  Prior radiograph from 03/09/2013  FINDINGS: Endotracheal tube terminates well above the carina. Enteric tube courses into the abdomen with tip located in the gastric fundus. Left IJ central venous catheter tip overlies the distal SVC. Cardiac and mediastinal silhouettes unchanged. Median sternotomy wires remain intact.  The lungs are normally inflated. There has been interval improvement in previously seen confluent opacity within the right perihilar region. No overt pulmonary edema. No new focal infiltrate or pneumothorax. No pleural effusion.  IMPRESSION: 1. Lines and tubes in satisfactory position. 2. Interval  improvement in the hazy opacity overlying the right mid lung. No new focal infiltrate or overt pulmonary edema identified.   Electronically Signed   By: Jeannine Boga M.D.   On: 03/10/2013 06:09   ASSESSMENT / PLAN:  PULMONARY A: VDRF post arrest  2/16: does not meet sbt criteria due to mental status  P:   - Begin PS trials but no extubation until mental status has been cleared. - Vent settings reviewed and adjusted. - Cont vent bundle. - Daily SBT as indicated.  CARDIOVASCULAR A: VF arrest - ? Primary event or secondary to prolonged seizure Sinus bradycardia on amiodarone Hypertension Recurrent VT off amiodarone  03/09/13: Off amio. In Sinus. Not on pressors  P:  - Cont PRN hydralazine and decrease MAP goal to 65. - Will defer amio to cards.  RENAL A: Creat elevated but improving, UOP low.  P:   - Monitor BMET intermittently. - Monitor I/Os. - Correct electrolytes as indicated. - Check CVP and hydrate as needed.  GASTROINTESTINAL A:  No issues P:   - SUP: PPI. - Consult nutrition for TF.  HEMATOLOGIC A: ANemia of critical illness Mild thrombocytopenia P:  - DVT px: SQ heparin. - Monitor CBC intermittently. - PRBC for hgb < 8gm% due to cardiac arrest.  INFECTIOUS A:  Temp 102F post rewarming  P:   - Pan cultures pending at this time. - PCT elevated but dropping, however patient is febrile, will start broad spectrum abx.  ENDOCRINE A: DM2, controlled P:   - Holding home glipizide and metformin. - Cont SSI.  NEUROLOGIC A:   Hydrocephalus, uncertain chronicity Post anoxic encephalopathy Seizures - ? New onset  03/09/13: Agitated on WUA  P:   - MRI ordered; per neuro. - Awaiting neuro prognostication specially with hydro. - Neuro following. - Guarded prognosis.  GLOBAL Increased RR with weaning and increased agitation, will hold weaning for today, once neuro situation is addressed then will begin active weaning.  Appreciate input from  neuro.  The patient is critically ill with multiple organ systems failure and requires high complexity decision making for assessment and support, frequent evaluation and titration of therapies, application of advanced monitoring technologies and extensive interpretation of multiple databases.   Critical Care Time devoted to patient care services described in this note is  35  Minutes.  Rush Farmer, M.D. [facility] Pulmonary/Critical Care Medicine. Pager: [phone]. After hours pager: [phone].

## 2013-03-11 NOTE — Progress Notes (Signed)
NUTRITION FOLLOW UP  Intervention:   1.  Enteral nutrition; continue Vital High Protein @ 45 mL/hr goal to provide 1080 kcal, 95g protein, 875 mL free water.  Total nutrition provision TF + propofol= 1528 kcal, 95g protein, 875 mL free water.  Nutrition Dx:   Inadequate oral intake, ongoing  Monitor:   1.  Enteral nutrition; initiation with tolerance.  Pt to meet >/=90% estimated needs with nutrition support.  2.  Wt/wt change; monitor trends  Assessment:   PMHx significant for HTN, HLD, DM. Admitted s/p witness seizure and cardiac arrest at [facility], required CPR x 5 minutes, pt was intubated in ED and coded again while there. Pt went to cath lab urgently on admission. Findings reveal severe native vessl CAD.   Patient is currently intubated on ventilator support.  Pt has been re-warmed. MV: 10.4 L/min Temp (24hrs), Avg:100.5 F (38.1 C), Min:97.9 F (36.6 C), Max:102.4 F (39.1 C)  Propofol: 17 ml/hr providing 448 kcal/d  Discussed with RN who states attempting to wean from propofol, however pt to remain intubated today so propofol may be variable.  Will continue current interventions.  No family at bedside today.   Height: Ht Readings from Last 1 Encounters:  03/07/13 6' (1.829 m)    Weight Status:   Wt Readings from Last 1 Encounters:  03/11/13 167 lb 15.9 oz (76.2 kg)   Estimated body mass index is 22.78 kg/(m^2) as calculated from the following:   Height as of this encounter: 6' (1.829 m).   Weight as of this encounter: 167 lb 15.9 oz (76.2 kg).   Re-estimated needs:  Kcal: 1779 Protein: 95-110g Fluid: ~2.2 L/day  Skin: intact  Diet Order:  NPO   Intake/Output Summary (Last 24 hours) at 03/11/13 1235 Last data filed at 03/11/13 1100  Gross per 24 hour  Intake 1470.66 ml  Output    720 ml  Net 750.66 ml    Last BM: 2/13   Labs:   Recent Labs Lab 03/07/13 1152 03/07/13 1219  03/09/13 0015  03/09/13 0750 03/10/13 0400 03/11/13 0344   NA 141 136*  < > 138  < > 142 142 142  K 3.8 3.9  < > 3.9  < > 4.1 4.2 4.2  CL 100 99  < > 104  < > 107 109 107  CO2  --  21  < > 21  < > 22 22 20   BUN 18 19  < > 21  < > 21 24* 27*  CREATININE 1.00 1.06  < > 0.96  < > 1.07 1.65* 1.56*  CALCIUM  --  8.5  < > 8.0*  < > 8.1* 7.7* 8.2*  MG  --  1.5  --  1.6  --   --   --  2.2  PHOS  --  3.8  --   --   --   --   --  4.1  GLUCOSE 212* 229*  < > 105*  < > 134* 155* 160*  < > = values in this interval not displayed.  CBG (last 3)   Recent Labs  03/11/13 0325 03/11/13 0724 03/11/13 1212  GLUCAP 149* 170* 158*    Scheduled Meds: . antiseptic oral rinse  15 mL Mouth Rinse QID  . aspirin  324 mg Per Tube Daily  . chlorhexidine  15 mL Mouth Rinse BID  . feeding supplement (VITAL HIGH PROTEIN)  1,000 mL Per Tube Q24H  . heparin subcutaneous  5,000 Units Subcutaneous  3 times per day  . insulin aspart  0-9 Units Subcutaneous 6 times per day  . metoprolol tartrate  50 mg Oral BID  . metoprolol tartrate  50 mg Per Tube Once  . pantoprazole (PROTONIX) IV  40 mg Intravenous QHS  . piperacillin-tazobactam (ZOSYN)  IV  3.375 g Intravenous Q8H  . vancomycin  1,500 mg Intravenous Once  . vancomycin  750 mg Intravenous Q12H    Continuous Infusions: . sodium chloride 10 mL/hr at 03/11/13 0700  . sodium chloride Stopped (03/11/13 0700)  . norepinephrine (LEVOPHED) Adult infusion Stopped (03/10/13 0646)  . propofol 35 mcg/kg/min (03/11/13 1100)    Brynda Greathouse, MS RD LDN Clinical Inpatient Dietitian Pager: [phone] Weekend/After hours pager: [phone]

## 2013-03-12 ENCOUNTER — Inpatient Hospital Stay (HOSPITAL_COMMUNITY): Payer: Medicaid Other

## 2013-03-12 LAB — POCT I-STAT 3, ART BLOOD GAS (G3+)
Bicarbonate: 24.2 mEq/L — ABNORMAL HIGH (ref 20.0–24.0)
O2 SAT: 97 %
PO2 ART: 95 mmHg (ref 80.0–100.0)
Patient temperature: 37.7
TCO2: 25 mmol/L (ref 0–100)
pCO2 arterial: 39.7 mmHg (ref 35.0–45.0)
pH, Arterial: 7.396 (ref 7.350–7.450)

## 2013-03-12 LAB — GLUCOSE, CAPILLARY
GLUCOSE-CAPILLARY: 139 mg/dL — AB (ref 70–99)
GLUCOSE-CAPILLARY: 144 mg/dL — AB (ref 70–99)
GLUCOSE-CAPILLARY: 173 mg/dL — AB (ref 70–99)
GLUCOSE-CAPILLARY: 199 mg/dL — AB (ref 70–99)
GLUCOSE-CAPILLARY: 202 mg/dL — AB (ref 70–99)
Glucose-Capillary: 184 mg/dL — ABNORMAL HIGH (ref 70–99)
Glucose-Capillary: 169 mg/dL — ABNORMAL HIGH (ref 70–99)
Glucose-Capillary: 164 mg/dL — ABNORMAL HIGH (ref 70–99)

## 2013-03-12 LAB — PHOSPHORUS: PHOSPHORUS: 2.4 mg/dL (ref 2.3–4.6)

## 2013-03-12 LAB — BASIC METABOLIC PANEL
BUN: 28 mg/dL — ABNORMAL HIGH (ref 6–23)
CHLORIDE: 108 meq/L (ref 96–112)
CO2: 20 meq/L (ref 19–32)
Calcium: 8.1 mg/dL — ABNORMAL LOW (ref 8.4–10.5)
Creatinine, Ser: 1.05 mg/dL (ref 0.50–1.35)
GFR calc Af Amer: 88 mL/min — ABNORMAL LOW (ref >90)
GFR calc non Af Amer: 76 mL/min — ABNORMAL LOW (ref >90)
Glucose, Bld: 195 mg/dL — ABNORMAL HIGH (ref 70–99)
POTASSIUM: 3.9 meq/L (ref 3.7–5.3)
SODIUM: 141 meq/L (ref 137–147)

## 2013-03-12 LAB — URINE CULTURE: Colony Count: 75000

## 2013-03-12 LAB — CBC WITH DIFFERENTIAL/PLATELET
BASOS PCT: 0 % (ref 0–1)
Basophils Absolute: 0 10*3/uL (ref 0.0–0.1)
Eosinophils Absolute: 0 10*3/uL (ref 0.0–0.7)
Eosinophils Relative: 0 % (ref 0–5)
HCT: 26.2 % — ABNORMAL LOW (ref 39.0–52.0)
Hemoglobin: 9.1 g/dL — ABNORMAL LOW (ref 13.0–17.0)
LYMPHS ABS: 0.8 10*3/uL (ref 0.7–4.0)
Lymphocytes Relative: 15 % (ref 12–46)
MCH: 31.5 pg (ref 26.0–34.0)
MCHC: 34.7 g/dL (ref 30.0–36.0)
MCV: 90.7 fL (ref 78.0–100.0)
MONOS PCT: 10 % (ref 3–12)
Monocytes Absolute: 0.5 10*3/uL (ref 0.1–1.0)
NEUTROS ABS: 4.1 10*3/uL (ref 1.7–7.7)
NEUTROS PCT: 75 % (ref 43–77)
PLATELETS: 130 10*3/uL — AB (ref 150–400)
RBC: 2.89 MIL/uL — AB (ref 4.22–5.81)
RDW: 13.3 % (ref 11.5–15.5)
WBC: 5.4 10*3/uL (ref 4.0–10.5)

## 2013-03-12 LAB — BLOOD GAS, ARTERIAL
ACID-BASE DEFICIT: 2 mmol/L (ref 0.0–2.0)
Bicarbonate: 22.1 mEq/L (ref 20.0–24.0)
DRAWN BY: 33176
FIO2: 0.3 %
LHR: 12 {breaths}/min
MECHVT: 600 mL
O2 SAT: 98.4 %
PEEP: 5 cmH2O
PO2 ART: 110 mmHg — AB (ref 80.0–100.0)
Patient temperature: 98.6
TCO2: 23.2 mmol/L (ref 0–100)
pCO2 arterial: 36.5 mmHg (ref 35.0–45.0)
pH, Arterial: 7.399 (ref 7.350–7.450)

## 2013-03-12 LAB — MAGNESIUM: MAGNESIUM: 2.4 mg/dL (ref 1.5–2.5)

## 2013-03-12 LAB — PROCALCITONIN: PROCALCITONIN: 4.74 ng/mL

## 2013-03-12 MED ORDER — POTASSIUM CHLORIDE 20 MEQ/15ML (10%) PO LIQD
ORAL | Status: AC
  Administered 2013-03-12: 40 meq
  Filled 2013-03-12: qty 30

## 2013-03-12 MED ORDER — DEXMEDETOMIDINE HCL IN NACL 200 MCG/50ML IV SOLN
0.4000 ug/kg/h | INTRAVENOUS | Status: DC
  Administered 2013-03-12: 0.6 ug/kg/h via INTRAVENOUS
  Administered 2013-03-12: 1.1 ug/kg/h via INTRAVENOUS
  Filled 2013-03-12 (×2): qty 50

## 2013-03-12 MED ORDER — FUROSEMIDE 10 MG/ML IJ SOLN
40.0000 mg | Freq: Three times a day (TID) | INTRAMUSCULAR | Status: AC
  Administered 2013-03-12 (×2): 40 mg via INTRAVENOUS
  Filled 2013-03-12 (×3): qty 4

## 2013-03-12 MED ORDER — METOPROLOL TARTRATE 1 MG/ML IV SOLN
5.0000 mg | Freq: Four times a day (QID) | INTRAVENOUS | Status: DC
  Administered 2013-03-12 – 2013-03-14 (×6): 5 mg via INTRAVENOUS
  Filled 2013-03-12 (×10): qty 5

## 2013-03-12 MED ORDER — POTASSIUM CHLORIDE 20 MEQ/15ML (10%) PO LIQD
40.0000 meq | Freq: Three times a day (TID) | ORAL | Status: AC
  Administered 2013-03-12: 40 meq
  Filled 2013-03-12 (×2): qty 30

## 2013-03-12 MED ORDER — LABETALOL HCL 5 MG/ML IV SOLN
10.0000 mg | INTRAVENOUS | Status: DC | PRN
  Administered 2013-03-12 – 2013-03-18 (×8): 10 mg via INTRAVENOUS
  Filled 2013-03-12 (×10): qty 4

## 2013-03-12 MED ORDER — POTASSIUM CHLORIDE 10 MEQ/100ML IV SOLN
10.0000 meq | INTRAVENOUS | Status: AC
  Administered 2013-03-12 (×4): 10 meq via INTRAVENOUS
  Filled 2013-03-12 (×4): qty 100

## 2013-03-12 NOTE — Progress Notes (Signed)
Pulmonary/Critical Care Progress Note   Name: Eric [name] MRN: [id number] DOB: 1955/06/04    ADMISSION DATE:  03/07/2013    REFERRING MD :  EDP PRIMARY SERVICE: PCCM  CHIEF COMPLAINT: Post arrest   BRIEF PATIENT DESCRIPTION:  58 yo with witnessed sz at [facility]. Apneic and pulseless at scene. CPR x 5 minutes, king airway placed by EMS, intubated in ED. 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    SIGNIFICANT EVENTS / STUDIES:  2/13 CT head:  Hydrocephalus, with diffuse dilatation of the lateral and third ventricles. 2/13 Cards consult Terrence Dupont) and LHC: Cardiac cath with severe native vessel coronary artery disease but patent SVGs and LIMA 2/13 Neuro Consult: MRI and EEG recommended 2/14 EEG: This is an abnormal EEG due to a discontinuous slow background. No epileptiform activity is noted. This finding is consistent with the patient's current medications.  2/14 Amiodarone discontinued due to sinus bradycardia 03/09/13: Rewarming started last night. Not yet @ target temp. Episode of VT last PM  LINES / TUBES: R fem CVL 2/13 >> 2/14 ETT 2/13 >> L IJ CVL 2/13 >> R fem A-line 2/13 >>   CULTURES: Urine 2/13 >> NEG Blood 2/13 >>   ANTIBIOTICS: Vancomycin 2/17>>> Zosyn 2/17>>>  SUBJECTIVE: No events overnight, with precedex is actually following commands.  VITAL SIGNS: Temp:  [97.9 F (36.6 C)-100 F (37.8 C)] 100 F (37.8 C) (02/18 1100) Pulse Rate:  [68-100] 80 (02/18 1100) Resp:  [0-31] 28 (02/18 1100) BP: (107-181)/(60-86) 157/75 mmHg (02/18 0944) SpO2:  [97 %-100 %] 99 % (02/18 1100) Arterial Line BP: (130-193)/(57-85) 179/83 mmHg (02/18 1100) FiO2 (%):  [30 %] 30 % (02/18 1100) Weight:  [98.1 kg (216 lb 4.3 oz)] 98.1 kg (216 lb 4.3 oz) (02/18 0406) HEMODYNAMICS: CVP:  [6 mmHg] 6 mmHg VENTILATOR SETTINGS: Vent Mode:  [-] CPAP;PSV FiO2 (%):  [30 %] 30 % Set Rate:  [12 bmp] 12 bmp Vt Set:  [600 mL] 600 mL PEEP:  [5 cmH20] 5 cmH20 Pressure  Support:  [5 cmH20-10 cmH20] 5 cmH20 Plateau Pressure:  [19 cmH20-21 cmH20] 21 cmH20 INTAKE / OUTPUT: Intake/Output     02/17 0701 - 02/18 0700 02/18 0701 - 02/19 0700   I.V. (mL/kg) 735.2 (7.5) 153.8 (1.6)   NG/GT 1320 280   IV Piggyback 800 200   Total Intake(mL/kg) 2855.2 (29.1) 633.8 (6.5)   Urine (mL/kg/hr) 1400 (0.6) 280 (0.7)   Total Output 1400 280   Net +1455.2 +353.8         PHYSICAL EXAMINATION: General: sedated, intubated, lethargic but arousable. Neuro: Arousable, follows commands but lethargic.Marland Kitchen HEENT: pupils symmetric Cardiovascular:  RRR, Nl S1/S2, -M/R/G. Lungs:  Clear anteriorly, no wheezes Abdomen: Soft, nondistended Ext: no edema  PULMONARY  Recent Labs Lab 03/07/13 1302 03/07/13 1533 03/07/13 1705 03/07/13 2314 03/08/13 0426 03/12/13 0317  PHART 7.380 7.261* 7.408  --  7.407 7.399  PCO2ART 46.2* 42.4 43.1  --  36.8 36.5  PO2ART 236.0* 74.0* 282.0*  --  180.0* 110.0*  HCO3 27.4* 19.1* 27.2*  --  23.8 22.1  TCO2 29 20 28 21  25.2 23.2  O2SAT 100.0 92.0 100.0  --  99.6 98.4   CBC  Recent Labs Lab 03/10/13 0400 03/11/13 0344 03/12/13 0410  HGB 9.2* 10.2* 9.1*  HCT 26.9* 29.5* 26.2*  WBC 7.6 6.9 5.4  PLT 146* 143* 130*   COAGULATION  Recent Labs Lab 03/07/13 1136 03/07/13 1219 03/07/13 2000  INR 1.21 1.28 1.32   CARDIAC  Recent Labs Lab 03/07/13 1136 03/11/13 0340  TROPONINI <0.30 <0.30    Recent Labs Lab 03/11/13 0340  PROBNP 370.5*   CHEMISTRY  Recent Labs Lab 03/07/13 1152 03/07/13 1219  03/09/13 0015 03/09/13 0400 03/09/13 0750 03/10/13 0400 03/11/13 0344 03/12/13 0410  NA 141 136*  < > 138 142 142 142 142 141  K 3.8 3.9  < > 3.9 4.0 4.1 4.2 4.2 3.9  CL 100 99  < > 104 108 107 109 107 108  CO2  --  21  < > 21 21 22 22 20 20   GLUCOSE 212* 229*  < > 105* 118* 134* 155* 160* 195*  BUN 18 19  < > 21 20 21  24* 27* 28*  CREATININE 1.00 1.06  < > 0.96 0.98 1.07 1.65* 1.56* 1.05  CALCIUM  --  8.5  < > 8.0* 8.0*  8.1* 7.7* 8.2* 8.1*  MG  --  1.5  --  1.6  --   --   --  2.2 2.4  PHOS  --  3.8  --   --   --   --   --  4.1 2.4  < > = values in this interval not displayed. Estimated Creatinine Clearance: 91.9 ml/min (by C-G formula based on Cr of 1.05).  LIVER  Recent Labs Lab 03/07/13 1136 03/07/13 1219 03/07/13 2000 03/09/13 0400  AST 49* 45*  --  71*  ALT 59* 54*  --  71*  ALKPHOS 61 59  --  50  BILITOT 0.3 0.4  --  0.6  PROT 8.2 7.6  --  7.0  ALBUMIN 2.6* 2.3*  --  2.0*  INR 1.21 1.28 1.32  --    INFECTIOUS  Recent Labs Lab 03/07/13 1219 03/10/13 1130 03/11/13 0344 03/11/13 0500 03/12/13 0410  LATICACIDVEN 2.7* 0.9  --  1.7  --   PROCALCITON <0.10 11.11 6.28  --  4.74   ENDOCRINE CBG (last 3)   Recent Labs  03/11/13 2010 03/12/13 0014 03/12/13 0403  GLUCAP 173* 199* 184*   IMAGING x48h  Dg Chest Port 1 View  03/12/2013   CLINICAL DATA:  Assess endotracheal tube  EXAM: PORTABLE CHEST - 1 VIEW  COMPARISON:  DG CHEST 1V PORT dated 03/11/2013  FINDINGS: Endotracheal tube tip projects 4.3 cm above the carina. Left internal jugular central venous catheter with distal tip projecting in proximal superior vena cava. Nasogastric tube past the gastroesophageal junction, distal tip projecting pack at gastric fundus. Multiple EKG lines overlie the patient and may obscure subtle underlying pathology.  Cardiac silhouette appears mildly enlarged, status post median sternotomy. Similar interstitial prominence in central pulmonary vasculature engorgement. Mild patchy perihilar airspace opacities without pleural effusions. No pneumothorax.  Soft tissue planes and included osseous structures are unremarkable.  IMPRESSION: No apparent change in position of life support lines.  Mild cardiomegaly and interstitial prominence with perihilar airspace opacities concerning for pulmonary edema.   Electronically Signed   By: Elon Alas   On: 03/12/2013 04:57   Dg Chest Port 1 View  03/11/2013    CLINICAL DATA:  Check ETT position.  EXAM: PORTABLE CHEST - 1 VIEW  COMPARISON:  03/10/2013  FINDINGS: Prior CABG. Support devices are stable. Slight increasing perihilar opacities and interstitial prominence, possibly interstitial edema. Heart is borderline in size. No effusions. No acute bony abnormality.  IMPRESSION: Increasing interstitial prominence and perihilar opacities concerning for mild edema.   Electronically Signed   By: Rolm Baptise M.D.   On:  03/11/2013 06:46   ASSESSMENT / PLAN:  PULMONARY A: VDRF post arrest  P:   - Continue PS as tolerated. - D/C sedation other than precedex. - Vent settings reviewed and adjusted. - Cont vent bundle. - Diureses today, anticipate extubation in AM if mental status is improved.  CARDIOVASCULAR A: VF arrest - ? Primary event or secondary to prolonged seizure Sinus bradycardia on amiodarone Hypertension Recurrent VT off amiodarone  P:  - Cont PRN hydralazine and decrease MAP goal to 65. - Will defer amio to cards. - No further increase in anti-HTN, patient is hypertensive only when agitated then drops when not.  RENAL A: Creat elevated but improving.  P:   - Monitor BMET daily. - Monitor I/Os. - Correct electrolytes as indicated. - Lasix 40 mg IV x2 doses.  GASTROINTESTINAL A:  No issues P:   - SUP: PPI. - Continue TF.  HEMATOLOGIC A: ANemia of critical illness Mild thrombocytopenia P:  - DVT px: SQ heparin. - Monitor CBC intermittently. - PRBC for hgb < 8gm% due to cardiac arrest.  INFECTIOUS A:  Temp 102F post rewarming  P:   - Pan cultures pending at this time. - PCT elevated but dropping, however patient is febrile, will continue broad spectrum abx.  ENDOCRINE A: DM2, controlled P:   - Holding home glipizide and metformin. - Cont SSI.  NEUROLOGIC A:   Hydrocephalus, uncertain chronicity Post anoxic encephalopathy Seizures - ? New onset  03/09/13: Agitated on WUA  P:   - MRI per neuro. - D/C  propofol and start precedex (adequate response when re-evaluated). - Neuro following. - Guarded prognosis.  GLOBAL Maintain on PS trials as able today, diurese, SBT in AM and anticipate will be able to extubate in AM pending mental status.  The patient is critically ill with multiple organ systems failure and requires high complexity decision making for assessment and support, frequent evaluation and titration of therapies, application of advanced monitoring technologies and extensive interpretation of multiple databases.   Critical Care Time devoted to patient care services described in this note is  35  Minutes.  Rush Farmer, M.D. [facility] Pulmonary/Critical Care Medicine. Pager: [phone]. After hours pager: [phone].

## 2013-03-12 NOTE — Progress Notes (Signed)
[facility] Progress Note Patient Name: Eric [name] DOB: Jun 29, 1955 MRN: [id number]  Date of Service  03/12/2013   HPI/Events of Note     eICU Interventions  Change po KCL to IV, post extubation, not swallowing yet   Intervention Category Minor Interventions: Electrolytes abnormality - evaluation and management  Eric [name] 03/12/2013, 6:53 PM

## 2013-03-12 NOTE — Progress Notes (Signed)
Subjective:  Patient remains intubated sedated.  No further episodes of V. Fib or V. Tach.  Beta blocker dose has been increased  Objective:  Vital Signs in the last 24 hours: Temp:  [97.9 F (36.6 C)-100 F (37.8 C)] 100 F (37.8 C) (02/18 1000) Pulse Rate:  [68-100] 81 (02/18 1000) Resp:  [0-31] 31 (02/18 1000) BP: (107-181)/(60-103) 157/75 mmHg (02/18 0944) SpO2:  [97 %-100 %] 99 % (02/18 1000) Arterial Line BP: (130-193)/(57-85) 156/72 mmHg (02/18 1000) FiO2 (%):  [30 %] 30 % (02/18 1000) Weight:  [98.1 kg (216 lb 4.3 oz)] 98.1 kg (216 lb 4.3 oz) (02/18 0406)  Intake/Output from previous day: 02/17 0701 - 02/18 0700 In: 2855.2 [I.V.:735.2; NG/GT:1320; IV Piggyback:800] Out: 1400 [Urine:1400] Intake/Output from this shift: Total I/O In: 80.2 [I.V.:35.2; NG/GT:45] Out: 130 [Urine:130]  Physical Exam: Neck: no adenopathy, no carotid bruit, no JVD and supple, symmetrical, trachea midline Lungs: decreased breath sounds at bases Heart: regular rate and rhythm, S1, S2 normal and soft systolic murmur noted Abdomen: soft, non-tender; bowel sounds normal; no masses,  no organomegaly Extremities: extremities normal, atraumatic, no cyanosis or edema  Lab Results:  Recent Labs  03/11/13 0344 03/12/13 0410  WBC 6.9 5.4  HGB 10.2* 9.1*  PLT 143* 130*    Recent Labs  03/11/13 0344 03/12/13 0410  NA 142 141  K 4.2 3.9  CL 107 108  CO2 20 20  GLUCOSE 160* 195*  BUN 27* 28*  CREATININE 1.56* 1.05    Recent Labs  03/11/13 0340  TROPONINI <0.30   Hepatic Function Panel No results found for this basename: PROT, ALBUMIN, AST, ALT, ALKPHOS, BILITOT, BILIDIR, IBILI,  in the last 72 hours No results found for this basename: CHOL,  in the last 72 hours No results found for this basename: PROTIME,  in the last 72 hours  Imaging: Imaging results have been reviewed and Dg Chest Port 1 View  03/12/2013   CLINICAL DATA:  Assess endotracheal tube  EXAM: PORTABLE CHEST - 1 VIEW   COMPARISON:  DG CHEST 1V PORT dated 03/11/2013  FINDINGS: Endotracheal tube tip projects 4.3 cm above the carina. Left internal jugular central venous catheter with distal tip projecting in proximal superior vena cava. Nasogastric tube past the gastroesophageal junction, distal tip projecting pack at gastric fundus. Multiple EKG lines overlie the patient and may obscure subtle underlying pathology.  Cardiac silhouette appears mildly enlarged, status post median sternotomy. Similar interstitial prominence in central pulmonary vasculature engorgement. Mild patchy perihilar airspace opacities without pleural effusions. No pneumothorax.  Soft tissue planes and included osseous structures are unremarkable.  IMPRESSION: No apparent change in position of life support lines.  Mild cardiomegaly and interstitial prominence with perihilar airspace opacities concerning for pulmonary edema.   Electronically Signed   By: Elon Alas   On: 03/12/2013 04:57   Dg Chest Port 1 View  03/11/2013   CLINICAL DATA:  Check ETT position.  EXAM: PORTABLE CHEST - 1 VIEW  COMPARISON:  03/10/2013  FINDINGS: Prior CABG. Support devices are stable. Slight increasing perihilar opacities and interstitial prominence, possibly interstitial edema. Heart is borderline in size. No effusions. No acute bony abnormality.  IMPRESSION: Increasing interstitial prominence and perihilar opacities concerning for mild edema.   Electronically Signed   By: Rolm Baptise M.D.   On: 03/11/2013 06:46    Cardiac Studies:  Assessment/Plan:  Status post recurrent V. fib cardiac arrest  Vent dependent respiratory failure secondary to above rule out aspiration  Coronary  artery disease status post CABG  Hypertension  Diabetes mellitus  Hypercholesteremia  Status post seizures  Hydrocephalus  Plan Continue present management Uptitrate beta blockers as tolerated  LOS: 5 days    Ronaldo Crilly N 03/12/2013, 10:33 AM

## 2013-03-12 NOTE — Procedures (Signed)
Extubation Procedure Note  Patient Details:   Name: Eric [name] DOB: Apr 15, 1955 MRN: [id number]   Airway Documentation:     Evaluation  O2 sats: stable throughout Complications: No apparent complications Patient did tolerate procedure well. Bilateral Breath Sounds: Rhonchi Suctioning: Airway Yes  Patient extubated to 5 LNC at this time. Positive cuff leak. Able to vocalize and clear secretions. RT will continue to monitor.  Saunders Glance 03/12/2013, 1:12 PM

## 2013-03-13 ENCOUNTER — Inpatient Hospital Stay (HOSPITAL_COMMUNITY): Payer: Medicaid Other

## 2013-03-13 LAB — BLOOD GAS, ARTERIAL
Acid-base deficit: 1.5 mmol/L (ref 0.0–2.0)
Bicarbonate: 22.5 mEq/L (ref 20.0–24.0)
DRAWN BY: 31101
O2 Content: 5 L/min
O2 SAT: 99.4 %
PH ART: 7.403 (ref 7.350–7.450)
PO2 ART: 135 mmHg — AB (ref 80.0–100.0)
Patient temperature: 98.6
TCO2: 23.6 mmol/L (ref 0–100)
pCO2 arterial: 36.8 mmHg (ref 35.0–45.0)

## 2013-03-13 LAB — CBC WITH DIFFERENTIAL/PLATELET
Basophils Absolute: 0 10*3/uL (ref 0.0–0.1)
Basophils Relative: 0 % (ref 0–1)
Eosinophils Absolute: 0.1 10*3/uL (ref 0.0–0.7)
Eosinophils Relative: 1 % (ref 0–5)
HCT: 27.3 % — ABNORMAL LOW (ref 39.0–52.0)
Hemoglobin: 9 g/dL — ABNORMAL LOW (ref 13.0–17.0)
Lymphocytes Relative: 19 % (ref 12–46)
Lymphs Abs: 1.3 10*3/uL (ref 0.7–4.0)
MCH: 30.2 pg (ref 26.0–34.0)
MCHC: 33 g/dL (ref 30.0–36.0)
MCV: 91.6 fL (ref 78.0–100.0)
MONOS PCT: 11 % (ref 3–12)
Monocytes Absolute: 0.7 10*3/uL (ref 0.1–1.0)
NEUTROS ABS: 4.6 10*3/uL (ref 1.7–7.7)
Neutrophils Relative %: 69 % (ref 43–77)
Platelets: 161 10*3/uL (ref 150–400)
RBC: 2.98 MIL/uL — AB (ref 4.22–5.81)
RDW: 13.3 % (ref 11.5–15.5)
WBC: 6.7 10*3/uL (ref 4.0–10.5)

## 2013-03-13 LAB — GLUCOSE, CAPILLARY
GLUCOSE-CAPILLARY: 124 mg/dL — AB (ref 70–99)
GLUCOSE-CAPILLARY: 164 mg/dL — AB (ref 70–99)
GLUCOSE-CAPILLARY: 172 mg/dL — AB (ref 70–99)
Glucose-Capillary: 134 mg/dL — ABNORMAL HIGH (ref 70–99)
Glucose-Capillary: 190 mg/dL — ABNORMAL HIGH (ref 70–99)

## 2013-03-13 LAB — BASIC METABOLIC PANEL
BUN: 25 mg/dL — ABNORMAL HIGH (ref 6–23)
CHLORIDE: 105 meq/L (ref 96–112)
CO2: 23 mEq/L (ref 19–32)
CREATININE: 1.08 mg/dL (ref 0.50–1.35)
Calcium: 8.2 mg/dL — ABNORMAL LOW (ref 8.4–10.5)
GFR calc Af Amer: 85 mL/min — ABNORMAL LOW (ref >90)
GFR, EST NON AFRICAN AMERICAN: 73 mL/min — AB (ref >90)
Glucose, Bld: 138 mg/dL — ABNORMAL HIGH (ref 70–99)
Potassium: 3.8 mEq/L (ref 3.7–5.3)
SODIUM: 144 meq/L (ref 137–147)

## 2013-03-13 LAB — PHOSPHORUS: Phosphorus: 3.1 mg/dL (ref 2.3–4.6)

## 2013-03-13 LAB — MAGNESIUM: MAGNESIUM: 2.1 mg/dL (ref 1.5–2.5)

## 2013-03-13 MED ORDER — AMPICILLIN 250 MG/5ML PO SUSR: 500.0000 mg | Freq: Four times a day (QID) | ORAL | Status: DC

## 2013-03-13 MED ORDER — BIOTENE DRY MOUTH MT LIQD
15.0000 mL | Freq: Two times a day (BID) | OROMUCOSAL | Status: DC
  Administered 2013-03-13 – 2013-03-25 (×23): 15 mL via OROMUCOSAL

## 2013-03-13 MED ORDER — NICARDIPINE HCL IN NACL 20-0.86 MG/200ML-% IV SOLN
3.0000 mg/h | INTRAVENOUS | Status: DC
  Administered 2013-03-13: 15 mg/h via INTRAVENOUS
  Administered 2013-03-13: 4 mg/h via INTRAVENOUS
  Administered 2013-03-13 (×2): 10 mg/h via INTRAVENOUS
  Administered 2013-03-14: 8 mg/h via INTRAVENOUS
  Filled 2013-03-13 (×6): qty 200

## 2013-03-13 MED ORDER — AMLODIPINE BESYLATE 10 MG PO TABS
10.0000 mg | ORAL_TABLET | Freq: Every day | ORAL | Status: DC
  Administered 2013-03-13 – 2013-03-15 (×3): 10 mg
  Filled 2013-03-13 (×4): qty 1

## 2013-03-13 MED ORDER — POTASSIUM CHLORIDE 20 MEQ/15ML (10%) PO LIQD
40.0000 meq | Freq: Three times a day (TID) | ORAL | Status: AC
  Administered 2013-03-13: 40 meq
  Filled 2013-03-13 (×2): qty 30

## 2013-03-13 MED ORDER — FUROSEMIDE 10 MG/ML IJ SOLN
40.0000 mg | Freq: Three times a day (TID) | INTRAMUSCULAR | Status: AC
  Administered 2013-03-13 (×2): 40 mg via INTRAVENOUS

## 2013-03-13 MED ORDER — NICARDIPINE HCL IN NACL 40-0.83 MG/200ML-% IV SOLN
3.0000 mg/h | INTRAVENOUS | Status: DC
  Administered 2013-03-14: 8 mg/h via INTRAVENOUS
  Administered 2013-03-14: 7 mg/h via INTRAVENOUS
  Filled 2013-03-13 (×4): qty 200

## 2013-03-13 MED ORDER — SODIUM CHLORIDE 0.9 % IV SOLN
3.0000 g | Freq: Four times a day (QID) | INTRAVENOUS | Status: AC
  Administered 2013-03-13 – 2013-03-19 (×25): 3 g via INTRAVENOUS
  Filled 2013-03-13 (×29): qty 3

## 2013-03-13 MED ORDER — AMLODIPINE 1 MG/ML ORAL SUSPENSION: 10.0000 mg | Freq: Every day | ORAL | Status: DC

## 2013-03-13 MED ORDER — LORAZEPAM 2 MG/ML IJ SOLN
2.0000 mg | Freq: Once | INTRAMUSCULAR | Status: AC
  Administered 2013-03-13: 2 mg via INTRAVENOUS

## 2013-03-13 MED ORDER — VANCOMYCIN HCL IN DEXTROSE 1-5 GM/200ML-% IV SOLN
1000.0000 mg | Freq: Two times a day (BID) | INTRAVENOUS | Status: DC
  Filled 2013-03-13: qty 200

## 2013-03-13 MED ORDER — LORAZEPAM 2 MG/ML IJ SOLN
INTRAMUSCULAR | Status: AC
  Filled 2013-03-13: qty 1

## 2013-03-13 MED ORDER — GADOBENATE DIMEGLUMINE 529 MG/ML IV SOLN
17.0000 mL | Freq: Once | INTRAVENOUS | Status: AC
  Administered 2013-03-13: 17 mL via INTRAVENOUS

## 2013-03-13 MED ORDER — VITAL 1.5 CAL PO LIQD
1000.0000 mL | ORAL | Status: DC
  Filled 2013-03-13 (×6): qty 1000

## 2013-03-13 MED ORDER — FUROSEMIDE 10 MG/ML IJ SOLN
INTRAMUSCULAR | Status: AC
  Administered 2013-03-13: 40 mg via INTRAVENOUS
  Filled 2013-03-13: qty 4

## 2013-03-13 MED ORDER — AMOXICILLIN-POT CLAVULANATE 400-57 MG/5ML PO SUSR
875.0000 mg | Freq: Two times a day (BID) | ORAL | Status: DC
  Administered 2013-03-13: 875 mg
  Filled 2013-03-13 (×3): qty 10.9

## 2013-03-13 NOTE — Evaluation (Signed)
Clinical/Bedside Swallow Evaluation Patient Details  Name: Eric [name] MRN: [id number] Date of Birth: 1955/04/18  Today's Date: 03/13/2013 Time: 0850-0909 SLP Time Calculation (min): 19 min  Past Medical History:  Past Medical History  Diagnosis Date  . Hypertension   . Hyperlipemia   . 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 Edema    Past Surgical History: No past surgical history on file. HPI:  58 yo with h/o CAD s/p CABG, DM, HTN admitted following witnessed sz at [facility]. Apneic and pulseless at scene. CPR x 5 minutes, king airway placed by EMS, intubated in ED. 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 Head CT showed hydrocephalus with diffuse dilatation fo the lateral and third ventricles. Intubated 2/13-2/18. Per MD notes unclear if this is acute hydrocephalus with increased ICP ( however, CT brain without evidence of mass effect) causing seizures and then a cardiac event, or perhaps a primary cardiac issue leading to seizures with an incidental finding of chronic obstructive hydrocephalus.    Assessment / Plan / Recommendation Clinical Impression  Patient presents with an acute reversible dysphagia s/p prolonged intubation. Aphonia, weak cough response, and suspected sensory deficits result in poor airway protection with all consistencies tested this date despite SLP assistance and cueing for altered sip/bite sizes. Prognosis for improvement good with time off vent. SLP will continue to f/u at bedside for diagnostic po trials and potential objective evaluation.     Aspiration Risk  Severe    Diet Recommendation NPO   Medication Administration: Via alternative means    Other  Recommendations Oral Care Recommendations: Oral care Q4 per protocol   Follow Up Recommendations   (TBD)    Frequency and Duration min 2x/week  2 weeks   Pertinent Vitals/Pain n/a     Swallow Study    General HPI: 58 yo with h/o CAD s/p CABG, DM,  HTN admitted following witnessed sz at [facility]. Apneic and pulseless at scene. CPR x 5 minutes, king airway placed by EMS, intubated in ED. 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 Head CT showed hydrocephalus with diffuse dilatation fo the lateral and third ventricles. Intubated 2/13-2/18. Per MD notes unclear if this is acute hydrocephalus with increased ICP ( however, CT brain without evidence of mass effect) causing seizures and then a cardiac event, or perhaps a primary cardiac issue leading to seizures with an incidental finding of chronic obstructive hydrocephalus.  Type of Study: Bedside swallow evaluation Previous Swallow Assessment: none Diet Prior to this Study: NPO Temperature Spikes Noted: No Respiratory Status: Nasal cannula History of Recent Intubation: Yes Length of Intubations (days): 5 days Date extubated: 03/12/13 Behavior/Cognition: Alert;Cooperative;Pleasant mood;Confused;Distractible;Requires cueing Oral Cavity - Dentition: Adequate natural dentition Self-Feeding Abilities: Needs assist Patient Positioning: Upright in bed Baseline Vocal Quality: Aphonic Volitional Cough: Weak Volitional Swallow: Able to elicit    Oral/Motor/Sensory Function Overall Oral Motor/Sensory Function: Appears within functional limits for tasks assessed (difficult to formally assess due to AMS, no focal deficits)   Ice Chips Ice chips: Impaired Presentation: Spoon Pharyngeal Phase Impairments: Suspected delayed Swallow;Throat Clearing - Delayed   Thin Liquid Thin Liquid: Impaired Presentation: Cup Pharyngeal  Phase Impairments: Suspected delayed Swallow;Multiple swallows;Cough - Immediate;Throat Clearing - Immediate    Nectar Thick Nectar Thick Liquid: Not tested   Honey Thick Honey Thick Liquid: Not tested   Puree Puree: Impaired Presentation: Spoon Pharyngeal Phase Impairments: Throat Clearing - Delayed;Cough - Delayed;Multiple swallows   Solid   GO  Eric Rainwater MA,  CCC-SLP [phone]  Solid: Not tested       Eric [name] 03/13/2013,11:04 AM

## 2013-03-13 NOTE — Progress Notes (Signed)
NUTRITION FOLLOW UP  Intervention:   1.  Diet advancement per SLP recommendations.  2.  If tube placed and tube feeding is warranted: Recommend Vital 1.5 @ 20 ml/hr and increase by 10 ml every 4 hours to goal rate of 60 ml/hr to provide 2160 kcals, 97 grams of protein, and 1100 ml of free water.  3.  Pt still without BM since 2/13, consider bowel regimen for pt.   Nutrition Dx:   Inadequate oral intake, ongoing  Goal:   Pt to meet >/=90% estimated nutrition needs; ongoing  Monitor:   Weight trends, labs, I/O's, SLP recommendations, if TF started-TF rate and tolerance  Assessment:   PMHx significant for HTN, HLD, DM. Admitted s/p witness seizure and cardiac arrest at [facility], required CPR x 5 minutes, pt was intubated in ED and coded again while there. Pt went to cath lab urgently on admission. Findings reveal severe native vessl CAD.   2/17-Patient is currently intubated on ventilator support. Discussed with RN who states attempting to wean from propofol, however pt to remain intubated throughout 2/17 so propofol may be variable.  2/19-Pt extubated 2/18 currently on nasal cannula. Discussed with RN who had an order to place a tube and has been NPO x24 hrs. Pt failed swallow test with severe aspiration risk per SLP. SLP to repeat evaluation test tomorrow morning and may recommend objective evaluation if indicated which would require removal of NGT. RD recommends holding off on tube placement for another 24 hours, if tube was solely to be used for nutrition purposes.  If tube placement is warranted for PO meds, orders will be placed so that nutrition can be provided as well.   Pt without s/s of inadequate nutrition PTA, and was maintained on tube feeding prior to extubation. CBGs have been elevated.    Height: Ht Readings from Last 1 Encounters:  03/07/13 6' (1.829 m)    Weight Status:   Wt Readings from Last 1 Encounters:  03/13/13 177 lb 0.5 oz (80.3 kg)  03/11/13 167  lb  Body mass index is 24 kg/(m^2).  Re-estimated needs:  Kcal: 2000-2200 Protein: 95-110 grams  Fluid: 2 L-2.2 L/day  Skin: intact  Diet Order:  NPO   Intake/Output Summary (Last 24 hours) at 03/13/13 0855 Last data filed at 03/13/13 0600  Gross per 24 hour  Intake 1465.58 ml  Output   2425 ml  Net -959.42 ml    Last BM: 2/13   Labs:   Recent Labs Lab 03/11/13 0344 03/12/13 0410 03/13/13 0345  NA 142 141 144  K 4.2 3.9 3.8  CL 107 108 105  CO2 20 20 23   BUN 27* 28* 25*  CREATININE 1.56* 1.05 1.08  CALCIUM 8.2* 8.1* 8.2*  MG 2.2 2.4 2.1  PHOS 4.1 2.4 3.1  GLUCOSE 160* 195* 138*    CBG (last 3)   Recent Labs  03/12/13 1957 03/12/13 2307 03/13/13 0327  GLUCAP 144* 139* 124*    Scheduled Meds: . antiseptic oral rinse  15 mL Mouth Rinse QID  . aspirin  324 mg Per Tube Daily  . chlorhexidine  15 mL Mouth Rinse BID  . heparin subcutaneous  5,000 Units Subcutaneous 3 times per day  . insulin aspart  0-9 Units Subcutaneous 6 times per day  . metoprolol  5 mg Intravenous 4 times per day  . piperacillin-tazobactam (ZOSYN)  IV  3.375 g Intravenous Q8H  . vancomycin  750 mg Intravenous Q12H    Continuous Infusions: .  sodium chloride 10 mL/hr at 03/11/13 0700  . sodium chloride Stopped (03/11/13 0700)    Kallie Locks Dietetic Intern Pager: [phone]  Brynda Greathouse, MS RD LDN Clinical Inpatient Dietitian Pager: [phone] Weekend/After hours pager: [phone]

## 2013-03-13 NOTE — Progress Notes (Signed)
Subjective:  Awake denies any complaints, extubated yesterday. BP elevated started on Cardene drip.  Objective:  Vital Signs in the last 24 hours: Temp:  [98.2 F (36.8 C)-100 F (37.8 C)] 98.4 F (36.9 C) (02/19 0900) Pulse Rate:  [77-94] 87 (02/19 0900) Resp:  [16-31] 20 (02/19 0900) BP: (125-182)/(62-82) 144/66 mmHg (02/18 1551) SpO2:  [90 %-100 %] 97 % (02/19 0900) Arterial Line BP: (134-206)/(64-95) 206/92 mmHg (02/19 0900) FiO2 (%):  [30 %] 30 % (02/18 1100) Weight:  [80.3 kg (177 lb 0.5 oz)] 80.3 kg (177 lb 0.5 oz) (02/19 0345)  Intake/Output from previous day: 02/18 0701 - 02/19 0700 In: 1565.8 [I.V.:390.8; NG/GT:325; IV Piggyback:850] Out: 2555 [Urine:2555] Intake/Output from this shift: Total I/O In: 40 [I.V.:40] Out: 250 [Urine:250]  Physical Exam: Neck: no adenopathy, no carotid bruit, no JVD and supple, symmetrical, trachea midline Lungs: Decreased breath sound at bases Heart: regular rate and rhythm, S1, S2 normal and Soft systolic murmur noted Abdomen: soft, non-tender; bowel sounds normal; no masses,  no organomegaly Extremities: extremities normal, atraumatic, no cyanosis or edema  Lab Results:  Recent Labs  03/12/13 0410 03/13/13 0345  WBC 5.4 6.7  HGB 9.1* 9.0*  PLT 130* 161    Recent Labs  03/12/13 0410 03/13/13 0345  NA 141 144  K 3.9 3.8  CL 108 105  CO2 20 23  GLUCOSE 195* 138*  BUN 28* 25*  CREATININE 1.05 1.08    Recent Labs  03/11/13 0340  TROPONINI <0.30   Hepatic Function Panel No results found for this basename: PROT, ALBUMIN, AST, ALT, ALKPHOS, BILITOT, BILIDIR, IBILI,  in the last 72 hours No results found for this basename: CHOL,  in the last 72 hours No results found for this basename: PROTIME,  in the last 72 hours  Imaging: Imaging results have been reviewed and Dg Chest Port 1 View  03/13/2013   CLINICAL DATA:  Endotracheal tube position, shortness of breath.  EXAM: PORTABLE CHEST - 1 VIEW  COMPARISON:  DG CHEST  1V PORT dated 03/12/2013  FINDINGS: Interval extubation. Interval removal of nasogastric tube. Left internal jugular central venous catheter with distal tip projecting in mid superior vena cava remains.  Stable mild moderate cardiomegaly, status post median sternotomy. Central pulmonary vasculature congestion interstitial prominence, slightly interstitial prominence. No pleural effusions. No pneumothorax.  Multiple EKG lines overlie the patient and may obscure subtle underlying pathology. Soft tissue planes and included osseous structures are unremarkable.  IMPRESSION: Interval extubation removal of nasogastric tube. No apparent change in position of left IJ line.  Stable cardiomegaly and worsening interstitial edema.   Electronically Signed   By: Elon Alas   On: 03/13/2013 05:58   Dg Chest Port 1 View  03/12/2013   CLINICAL DATA:  Assess endotracheal tube  EXAM: PORTABLE CHEST - 1 VIEW  COMPARISON:  DG CHEST 1V PORT dated 03/11/2013  FINDINGS: Endotracheal tube tip projects 4.3 cm above the carina. Left internal jugular central venous catheter with distal tip projecting in proximal superior vena cava. Nasogastric tube past the gastroesophageal junction, distal tip projecting pack at gastric fundus. Multiple EKG lines overlie the patient and may obscure subtle underlying pathology.  Cardiac silhouette appears mildly enlarged, status post median sternotomy. Similar interstitial prominence in central pulmonary vasculature engorgement. Mild patchy perihilar airspace opacities without pleural effusions. No pneumothorax.  Soft tissue planes and included osseous structures are unremarkable.  IMPRESSION: No apparent change in position of life support lines.  Mild cardiomegaly and interstitial prominence with  perihilar airspace opacities concerning for pulmonary edema.   Electronically Signed   By: Elon Alas   On: 03/12/2013 04:57    Cardiac Studies:  Assessment/Plan:  Status post recurrent V. fib  cardiac arrest  Status post Vent dependent respiratory failure secondary to above rule out aspiration  Coronary artery disease status post CABG STATUS post left cath small vessel disease Hypertension  Diabetes mellitus  Hypercholesteremia  Status post seizures  Hydrocephalus  Plan Continue present management Add ACE inhibitors when able to take by mouth  LOS: 6 days    Jazyiah Yiu N 03/13/2013, 10:18 AM

## 2013-03-13 NOTE — Consult Note (Signed)
ANTIBIOTIC CONSULT NOTE - INITIAL  Pharmacy Consult for vancomycin/zosyn Indication: pneumonia  No Known Allergies  Patient Measurements: Height: 6' (182.9 cm) Weight: 177 lb 0.5 oz (80.3 kg) IBW/kg (Calculated) : 77.6  Vital Signs: Temp: 98.4 F (36.9 C) (02/19 0900) Temp src: Core (Comment) (02/19 0900) Pulse Rate: 87 (02/19 0900) Intake/Output from previous day: 02/18 0701 - 02/19 0700 In: 1565.8 [I.V.:390.8; NG/GT:325; IV Piggyback:850] Out: 2555 [Urine:2555] Intake/Output from this shift: Total I/O In: 40 [I.V.:40] Out: 250 [Urine:250]  Labs:  Recent Labs  03/11/13 0344 03/12/13 0410 03/13/13 0345  WBC 6.9 5.4 6.7  HGB 10.2* 9.1* 9.0*  PLT 143* 130* 161  CREATININE 1.56* 1.05 1.08   Estimated Creatinine Clearance: 80.8 ml/min (by C-G formula based on Cr of 1.08). No results found for this basename: Letta Median, VANCORANDOM, GENTTROUGH, GENTPEAK, GENTRANDOM, TOBRATROUGH, TOBRAPEAK, TOBRARND, AMIKACINPEAK, AMIKACINTROU, AMIKACIN,  in the last 72 hours   Microbiology: Recent Results (from the past 720 hour(s))  URINE CULTURE     Status: None   Collection Time    03/07/13 12:19 PM      Result Value Ref Range Status   Specimen Description URINE, CATHETERIZED   Final   Special Requests NONE   Final   Culture  Setup Time     Final   Value: 03/07/2013 13:23     Performed at [place]     Final   Value: NO GROWTH     Performed at [facility]   Culture     Final   Value: NO GROWTH     Performed at [facility]   Report Status 03/08/2013 FINAL   Final  MRSA PCR SCREENING     Status: None   Collection Time    03/07/13  2:35 PM      Result Value Ref Range Status   MRSA by PCR NEGATIVE  NEGATIVE Final   Comment:            The GeneXpert MRSA Assay (FDA     approved for NASAL specimens     only), is one component of a     comprehensive MRSA colonization     surveillance program. It is not     intended to  diagnose MRSA     infection nor to guide or     monitor treatment for     MRSA infections.  CULTURE, BLOOD (ROUTINE X 2)     Status: None   Collection Time    03/07/13  7:42 PM      Result Value Ref Range Status   Specimen Description BLOOD LEFT HAND   Final   Special Requests BOTTLES DRAWN AEROBIC ONLY 1CC   Final   Culture  Setup Time     Final   Value: 03/08/2013 00:40     Performed at [facility]   Culture     Final   Value:        BLOOD CULTURE RECEIVED NO GROWTH TO DATE CULTURE WILL BE HELD FOR 5 DAYS BEFORE ISSUING A FINAL NEGATIVE REPORT     Performed at [facility]   Report Status PENDING   Incomplete  CULTURE, BLOOD (ROUTINE X 2)     Status: None   Collection Time    03/07/13  7:53 PM      Result Value Ref Range Status   Specimen Description BLOOD RIGHT HAND   Final   Special Requests BOTTLES DRAWN AEROBIC  ONLY 1CC   Final   Culture  Setup Time     Final   Value: 03/08/2013 00:40     Performed at [facility]   Culture     Final   Value:        BLOOD CULTURE RECEIVED NO GROWTH TO DATE CULTURE WILL BE HELD FOR 5 DAYS BEFORE ISSUING A FINAL NEGATIVE REPORT     Performed at [facility]   Report Status PENDING   Incomplete  URINE CULTURE     Status: None   Collection Time    03/10/13 11:13 AM      Result Value Ref Range Status   Specimen Description URINE, CATHETERIZED   Final   Special Requests NONE   Final   Culture  Setup Time     Final   Value: 03/10/2013 17:12     Performed at [facility]     Final   Value: 75,000 COLONIES/ML     Performed at [facility]   Culture     Final   Value: STAPHYLOCOCCUS SPECIES (COAGULASE NEGATIVE)     Note: RIFAMPIN AND GENTAMICIN SHOULD NOT BE USED AS SINGLE DRUGS FOR TREATMENT OF STAPH INFECTIONS.     Performed at [facility]   Report Status 03/12/2013 FINAL   Final   Organism ID, Bacteria STAPHYLOCOCCUS SPECIES (COAGULASE NEGATIVE)   Final  CULTURE,  RESPIRATORY (NON-EXPECTORATED)     Status: None   Collection Time    03/10/13 11:30 AM      Result Value Ref Range Status   Specimen Description TRACHEAL ASPIRATE   Final   Special Requests NONE   Final   Gram Stain     Final   Value: ABUNDANT WBC PRESENT, PREDOMINANTLY PMN     RARE SQUAMOUS EPITHELIAL CELLS PRESENT     ABUNDANT GRAM VARIABLE ROD     FEW GRAM POSITIVE COCCI     IN CLUSTERS   Culture     Final   Value: MODERATE KLEBSIELLA OXYTOCA     MODERATE KLEBSIELLA PNEUMONIAE     Performed at [facility]   Report Status 03/13/2013 FINAL   Final   Organism ID, Bacteria KLEBSIELLA OXYTOCA   Final  CULTURE, BLOOD (ROUTINE X 2)     Status: None   Collection Time    03/10/13 11:42 AM      Result Value Ref Range Status   Specimen Description BLOOD LEFT ANTECUBITAL   Final   Special Requests BOTTLES DRAWN AEROBIC AND ANAEROBIC 10CC   Final   Culture  Setup Time     Final   Value: 03/10/2013 16:16     Performed at [facility]   Culture     Final   Value:        BLOOD CULTURE RECEIVED NO GROWTH TO DATE CULTURE WILL BE HELD FOR 5 DAYS BEFORE ISSUING A FINAL NEGATIVE REPORT     Performed at [facility]   Report Status PENDING   Incomplete  CULTURE, BLOOD (ROUTINE X 2)     Status: None   Collection Time    03/10/13 11:50 AM      Result Value Ref Range Status   Specimen Description BLOOD LEFT HAND   Final   Special Requests BOTTLES DRAWN AEROBIC ONLY 10CC   Final   Culture  Setup Time     Final   Value: 03/10/2013 16:16     Performed at  [facility]     Final   Value:        BLOOD CULTURE RECEIVED NO GROWTH TO DATE CULTURE WILL BE HELD FOR 5 DAYS BEFORE ISSUING A FINAL NEGATIVE REPORT     Performed at [facility]   Report Status PENDING   Incomplete    Medical History: Past Medical History  Diagnosis Date  . Hypertension   . Hyperlipemia   . 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 Edema      Medications:  Scheduled:  . antiseptic oral rinse  15 mL Mouth Rinse QID  . aspirin  324 mg Per Tube Daily  . chlorhexidine  15 mL Mouth Rinse BID  . heparin subcutaneous  5,000 Units Subcutaneous 3 times per day  . insulin aspart  0-9 Units Subcutaneous 6 times per day  . metoprolol  5 mg Intravenous 4 times per day  . piperacillin-tazobactam (ZOSYN)  IV  3.375 g Intravenous Q8H  . vancomycin  1,000 mg Intravenous Q12H   Assessment: 58 yo M admitted s/p witnessed seizure who then became pulseless, apneic, and CPR was performed for 5 minutes. Patient s/p hypothermia protocol. Pharmacy is now consulted to dose vanc/zosyn for pneumonia.  SCr has improved down to 1.08.  CrCl ~80. Due to improvement in renal function, vancomycin dose will be adjusted accordingly today.  WBC 6.7. Afebrile currently, Tmax 100.  vanc 2/17 >> Zosyn 2/17>>  2/16 blood >> ngtd 2/16 trach aspirate >> Klebsiella (only resistant to ampicillin) 2/16 urine >>  Coag neg staph (only resistant to penicillin)   Goal of Therapy:  Vancomycin trough level 15-20 mcg/ml  Plan:  -Continue zosyn 3.375g IV q 8 hours -Increase vancomycin to 1000 mg IV q 12 hours  -Follow renal function, WBC, fever trend, clinical progression, trough at stead state as indicated -F/u plans for antibiotic de-escalation and length of therapy  Kaysen Sefcik C. Tiwatope Emmitt, PharmD Clinical Pharmacist-Resident Pager: [phone] Pharmacy: [phone] 03/13/2013 10:21 AM

## 2013-03-13 NOTE — Progress Notes (Addendum)
ANTIBIOTIC CONSULT NOTE - INITIAL  Pharmacy Consult for Unasyn Indication: pneumonia  No Known Allergies  Patient Measurements: Height: 6' (182.9 cm) Weight: 177 lb 0.5 oz (80.3 kg) IBW/kg (Calculated) : 77.6 Vital Signs: Temp: 99 F (37.2 C) (02/19 2100) Temp src: Core (Comment) (02/19 1200) BP: 129/70 mmHg (02/19 2110) Pulse Rate: 78 (02/19 2100) Intake/Output from previous day: 02/18 0701 - 02/19 0700 In: 1565.8 [I.V.:390.8; NG/GT:325; IV Piggyback:850] Out: 2555 [Urine:2555] Intake/Output from this shift: Total I/O In: 212.3 [I.V.:212.3] Out: -   Labs:  Recent Labs  03/11/13 0344 03/12/13 0410 03/13/13 0345  WBC 6.9 5.4 6.7  HGB 10.2* 9.1* 9.0*  PLT 143* 130* 161  CREATININE 1.56* 1.05 1.08   Estimated Creatinine Clearance: 80.8 ml/min (by C-G formula based on Cr of 1.08). No results found for this basename: Letta Median, VANCORANDOM, GENTTROUGH, GENTPEAK, GENTRANDOM, TOBRATROUGH, TOBRAPEAK, TOBRARND, AMIKACINPEAK, AMIKACINTROU, AMIKACIN,  in the last 72 hours   Microbiology: Recent Results (from the past 720 hour(s))  URINE CULTURE     Status: None   Collection Time    03/07/13 12:19 PM      Result Value Ref Range Status   Specimen Description URINE, CATHETERIZED   Final   Special Requests NONE   Final   Culture  Setup Time     Final   Value: 03/07/2013 13:23     Performed at [facility]     Final   Value: NO GROWTH     Performed at [facility]   Culture     Final   Value: NO GROWTH     Performed at [facility]   Report Status 03/08/2013 FINAL   Final  MRSA PCR SCREENING     Status: None   Collection Time    03/07/13  2:35 PM      Result Value Ref Range Status   MRSA by PCR NEGATIVE  NEGATIVE Final   Comment:            The GeneXpert MRSA Assay (FDA     approved for NASAL specimens     only), is one component of a     comprehensive MRSA colonization     surveillance program. It is not      intended to diagnose MRSA     infection nor to guide or     monitor treatment for     MRSA infections.  CULTURE, BLOOD (ROUTINE X 2)     Status: None   Collection Time    03/07/13  7:42 PM      Result Value Ref Range Status   Specimen Description BLOOD LEFT HAND   Final   Special Requests BOTTLES DRAWN AEROBIC ONLY 1CC   Final   Culture  Setup Time     Final   Value: 03/08/2013 00:40     Performed at [facility]   Culture     Final   Value:        BLOOD CULTURE RECEIVED NO GROWTH TO DATE CULTURE WILL BE HELD FOR 5 DAYS BEFORE ISSUING A FINAL NEGATIVE REPORT     Performed at [facility]   Report Status PENDING   Incomplete  CULTURE, BLOOD (ROUTINE X 2)     Status: None   Collection Time    03/07/13  7:53 PM      Result Value Ref Range Status   Specimen Description BLOOD RIGHT HAND   Final   Special  Requests BOTTLES DRAWN AEROBIC ONLY 1CC   Final   Culture  Setup Time     Final   Value: 03/08/2013 00:40     Performed at [facility]   Culture     Final   Value:        BLOOD CULTURE RECEIVED NO GROWTH TO DATE CULTURE WILL BE HELD FOR 5 DAYS BEFORE ISSUING A FINAL NEGATIVE REPORT     Performed at [facility]   Report Status PENDING   Incomplete  URINE CULTURE     Status: None   Collection Time    03/10/13 11:13 AM      Result Value Ref Range Status   Specimen Description URINE, CATHETERIZED   Final   Special Requests NONE   Final   Culture  Setup Time     Final   Value: 03/10/2013 17:12     Performed at [facility]     Final   Value: 75,000 COLONIES/ML     Performed at [facility]   Culture     Final   Value: STAPHYLOCOCCUS SPECIES (COAGULASE NEGATIVE)     Note: RIFAMPIN AND GENTAMICIN SHOULD NOT BE USED AS SINGLE DRUGS FOR TREATMENT OF STAPH INFECTIONS.     Performed at [facility]   Report Status 03/12/2013 FINAL   Final   Organism ID, Bacteria STAPHYLOCOCCUS SPECIES (COAGULASE NEGATIVE)   Final   CULTURE, RESPIRATORY (NON-EXPECTORATED)     Status: None   Collection Time    03/10/13 11:30 AM      Result Value Ref Range Status   Specimen Description TRACHEAL ASPIRATE   Final   Special Requests NONE   Final   Gram Stain     Final   Value: ABUNDANT WBC PRESENT, PREDOMINANTLY PMN     RARE SQUAMOUS EPITHELIAL CELLS PRESENT     ABUNDANT GRAM VARIABLE ROD     FEW GRAM POSITIVE COCCI     IN CLUSTERS   Culture     Final   Value: MODERATE KLEBSIELLA OXYTOCA     MODERATE KLEBSIELLA PNEUMONIAE     Performed at [facility]   Report Status 03/13/2013 FINAL   Final   Organism ID, Bacteria KLEBSIELLA OXYTOCA   Final  CULTURE, BLOOD (ROUTINE X 2)     Status: None   Collection Time    03/10/13 11:42 AM      Result Value Ref Range Status   Specimen Description BLOOD LEFT ANTECUBITAL   Final   Special Requests BOTTLES DRAWN AEROBIC AND ANAEROBIC 10CC   Final   Culture  Setup Time     Final   Value: 03/10/2013 16:16     Performed at [facility]   Culture     Final   Value:        BLOOD CULTURE RECEIVED NO GROWTH TO DATE CULTURE WILL BE HELD FOR 5 DAYS BEFORE ISSUING A FINAL NEGATIVE REPORT     Performed at [facility]   Report Status PENDING   Incomplete  CULTURE, BLOOD (ROUTINE X 2)     Status: None   Collection Time    03/10/13 11:50 AM      Result Value Ref Range Status   Specimen Description BLOOD LEFT HAND   Final   Special Requests BOTTLES DRAWN AEROBIC ONLY 10CC   Final   Culture  Setup Time     Final   Value: 03/10/2013 16:16  Performed at [facility]     Final   Value:        BLOOD CULTURE RECEIVED NO GROWTH TO DATE CULTURE WILL BE HELD FOR 5 DAYS BEFORE ISSUING A FINAL NEGATIVE REPORT     Performed at [facility]   Report Status PENDING   Incomplete    Medical History: Past Medical History  Diagnosis Date  . Hypertension   . Hyperlipemia   . 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  Edema    Assessment: 63 YOM on Augmentin for Klebsiella pneumonia who lost oral access and is refusing to have NG tube replaced now to change to IV Unasyn. CrCl ~81 mL/min. WBC wnl. Afebrile.   Goal of Therapy:  Clinical resolution of infection  Plan:  1. Unasyn 3g IV q6h.  2. Follow-up renal function and adjust dosing as needed.   Sloan Leiter, PharmD, BCPS Clinical Pharmacist [phone] 03/13/2013,9:30 PM  Addendum: Patient's AKI is resolving and CrCl ~85 mL/min. Will continue Unasyn 3g IV q6h (no dose adjustment required unless CrCl <30 mL/min). Pharmacy will sign off for now. Please consult [place] if we can be of further assistance or if patient's clinical status changes.   Malini Flemings C. Sheilla Maris, PharmD Clinical Pharmacist-Resident Pager: [phone] Pharmacy: [phone] 03/14/2013 2:18 PM

## 2013-03-13 NOTE — Progress Notes (Signed)
Subjective: Extubated yesterday  Exam: Filed Vitals:   03/13/13 0600  BP:   Pulse: 82  Temp: 98.6 F (37 C)  Resp: 27   Gen: In bed, NAD MS: Awake, Alert, answers questions with nods/shakes. Appears to understand, no verbal output but I am unsure if this is vocal cord dysfunction from intubation or expressive aphasia [id number], EOMI Motor: Follows command sx 4.  Sensory:intact to LT.   Impression: 58 yo M s/p arrest and ? Seizure. Not clear if seizure -> arret vs arrest -> convulsive syncope or seizure. His mental status is improving which would be unusual if this represents acute hydrocephalus. There is also some possibility that he has had a previous CT.   Recommendations: 1) try to get Outside CT 2) MRI brain today, if MRI delayed, will get repeat CT.   Roland Rack, MD [facility] [phone]  If 7pm- 7am, please page neurology on call at [phone].

## 2013-03-13 NOTE — Plan of Care (Signed)
Problem: SLP Dysphagia Goals Goal: Patient will demonstrate readiness for PO's Patient will demonstrate readiness for PO's and/or instrumental swallow study as evidenced by: As evidenced by signs of airway protection with at least one consistency tested with max assist.

## 2013-03-13 NOTE — Progress Notes (Signed)
[facility] Progress Note Patient Name: Eric [name] DOB: 03-15-1955 MRN: [id number]  Date of Service  03/13/2013   HPI/Events of Note   Patient removed NG, refusing to have it put back in  eICU Interventions  Change augmentin to unasyn   Intervention Category Major Interventions: Infection - evaluation and management  Colette Dicamillo 03/13/2013, 9:23 PM

## 2013-03-13 NOTE — Progress Notes (Signed)
Pulmonary/Critical Care Progress Note   Name: Eric [name] MRN: [id number] DOB: [birth year]    ADMISSION DATE:  03/07/2013    REFERRING MD :  EDP PRIMARY SERVICE: PCCM  CHIEF COMPLAINT: Post arrest   BRIEF PATIENT DESCRIPTION:  58 yo with witnessed sz at [employer]. Apneic and pulseless at scene. CPR x 5 minutes, king airway placed by EMS, intubated in ED. 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    SIGNIFICANT EVENTS / STUDIES:  2/13 CT head:  Hydrocephalus, with diffuse dilatation of the lateral and third ventricles. 2/13 Cards consult Terrence Dupont) and LHC: Cardiac cath with severe native vessel coronary artery disease but patent SVGs and LIMA 2/13 Neuro Consult: MRI and EEG recommended 2/14 EEG: This is an abnormal EEG due to a discontinuous slow background. No epileptiform activity is noted. This finding is consistent with the patient's current medications.  2/14 Amiodarone discontinued due to sinus bradycardia 03/09/13: Rewarming started last night. Not yet @ target temp. Episode of VT last PM  LINES / TUBES: R fem CVL 2/13 >> 2/14 ETT 2/13 >>2/18 L IJ CVL 2/13 >> R fem A-line 2/13 >>  CULTURES: Urine 2/13 >> NEG Blood 2/13 >> NTD Sputum 2/14>>>Klebsiella  ANTIBIOTICS: Vancomycin 2/17>>>2/19 Zosyn 2/17>>>2/19 Amp 2/19>>>  SUBJECTIVE: No events overnight, lethargic but alert and interactive.  VITAL SIGNS: Temp:  [98.2 F (36.8 C)-100 F (37.8 C)] 98.6 F (37 C) (02/19 1000) Pulse Rate:  [77-94] 85 (02/19 1000) Resp:  [16-43] 43 (02/19 1000) BP: (125-182)/(62-82) 144/66 mmHg (02/18 1551) SpO2:  [90 %-100 %] 98 % (02/19 1000) Arterial Line BP: (134-206)/(64-95) 205/89 mmHg (02/19 1000) FiO2 (%):  [30 %] 30 % (02/18 1100) Weight:  [80.3 kg (177 lb 0.5 oz)] 80.3 kg (177 lb 0.5 oz) (02/19 0345) HEMODYNAMICS:   VENTILATOR SETTINGS: Vent Mode:  [-]  FiO2 (%):  [30 %] 30 % INTAKE / OUTPUT: Intake/Output     02/18 0701 - 02/19 0700 02/19 0701 -  02/20 0700   I.V. (mL/kg) 390.8 (4.9) 40 (0.5)   NG/GT 325    IV Piggyback 850    Total Intake(mL/kg) 1565.8 (19.5) 40 (0.5)   Urine (mL/kg/hr) 2555 (1.3) 250 (0.8)   Total Output 2555 250   Net -989.2 -210         PHYSICAL EXAMINATION: General: Lethargic, following command. Neuro: Arousable, follows commands but lethargic.Marland Kitchen HEENT: pupils symmetric but reactive. Cardiovascular:  RRR, Nl S1/S2, -M/R/G. Lungs: Clear anteriorly, no wheezes Abdomen: Soft, nondistended Ext: no edema  PULMONARY  Recent Labs Lab 03/07/13 1705 03/07/13 2314 03/08/13 0426 03/12/13 0317 03/12/13 1136 03/13/13 0500  PHART 7.408  --  7.407 7.399 7.396 7.403  PCO2ART 43.1  --  36.8 36.5 39.7 36.8  PO2ART 282.0*  --  180.0* 110.0* 95.0 135.0*  HCO3 27.2*  --  23.8 22.1 24.2* 22.5  TCO2 28 21 25.2 23.2 25 23.6  O2SAT 100.0  --  99.6 98.4 97.0 99.4   CBC  Recent Labs Lab 03/11/13 0344 03/12/13 0410 03/13/13 0345  HGB 10.2* 9.1* 9.0*  HCT 29.5* 26.2* 27.3*  WBC 6.9 5.4 6.7  PLT 143* 130* 161   COAGULATION  Recent Labs Lab 03/07/13 1136 03/07/13 1219 03/07/13 2000  INR 1.21 1.28 1.32   CARDIAC  Recent Labs Lab 03/07/13 1136 03/11/13 0340  TROPONINI <0.30 <0.30   Recent Labs Lab 03/11/13 0340  PROBNP 370.5*   CHEMISTRY  Recent Labs Lab 03/07/13 1152 03/07/13 1219  03/09/13 0015  03/09/13 0750 03/10/13  0400 03/11/13 0344 03/12/13 0410 03/13/13 0345  NA 141 136*  < > 138  < > 142 142 142 141 144  K 3.8 3.9  < > 3.9  < > 4.1 4.2 4.2 3.9 3.8  CL 100 99  < > 104  < > 107 109 107 108 105  CO2  --  21  < > 21  < > 22 22 20 20 23   GLUCOSE 212* 229*  < > 105*  < > 134* 155* 160* 195* 138*  BUN 18 19  < > 21  < > 21 24* 27* 28* 25*  CREATININE 1.00 1.06  < > 0.96  < > 1.07 1.65* 1.56* 1.05 1.08  CALCIUM  --  8.5  < > 8.0*  < > 8.1* 7.7* 8.2* 8.1* 8.2*  MG  --  1.5  --  1.6  --   --   --  2.2 2.4 2.1  PHOS  --  3.8  --   --   --   --   --  4.1 2.4 3.1  < > = values in  this interval not displayed. Estimated Creatinine Clearance: 80.8 ml/min (by C-G formula based on Cr of 1.08).  LIVER  Recent Labs Lab 03/07/13 1136 03/07/13 1219 03/07/13 2000 03/09/13 0400  AST 49* 45*  --  71*  ALT 59* 54*  --  71*  ALKPHOS 61 59  --  50  BILITOT 0.3 0.4  --  0.6  PROT 8.2 7.6  --  7.0  ALBUMIN 2.6* 2.3*  --  2.0*  INR 1.21 1.28 1.32  --    INFECTIOUS  Recent Labs Lab 03/07/13 1219 03/10/13 1130 03/11/13 0344 03/11/13 0500 03/12/13 0410  LATICACIDVEN 2.7* 0.9  --  1.7  --   PROCALCITON <0.10 11.11 6.28  --  4.74   ENDOCRINE CBG (last 3)   Recent Labs  03/12/13 1957 03/12/13 2307 03/13/13 0327  GLUCAP 144* 139* 124*   IMAGING x48h  Dg Chest Port 1 View  03/13/2013   CLINICAL DATA:  Endotracheal tube position, shortness of breath.  EXAM: PORTABLE CHEST - 1 VIEW  COMPARISON:  DG CHEST 1V PORT dated 03/12/2013  FINDINGS: Interval extubation. Interval removal of nasogastric tube. Left internal jugular central venous catheter with distal tip projecting in mid superior vena cava remains.  Stable mild moderate cardiomegaly, status post median sternotomy. Central pulmonary vasculature congestion interstitial prominence, slightly interstitial prominence. No pleural effusions. No pneumothorax.  Multiple EKG lines overlie the patient and may obscure subtle underlying pathology. Soft tissue planes and included osseous structures are unremarkable.  IMPRESSION: Interval extubation removal of nasogastric tube. No apparent change in position of left IJ line.  Stable cardiomegaly and worsening interstitial edema.   Electronically Signed   By: Elon Alas   On: 03/13/2013 05:58   Dg Chest Port 1 View  03/12/2013   CLINICAL DATA:  Assess endotracheal tube  EXAM: PORTABLE CHEST - 1 VIEW  COMPARISON:  DG CHEST 1V PORT dated 03/11/2013  FINDINGS: Endotracheal tube tip projects 4.3 cm above the carina. Left internal jugular central venous catheter with distal tip  projecting in proximal superior vena cava. Nasogastric tube past the gastroesophageal junction, distal tip projecting pack at gastric fundus. Multiple EKG lines overlie the patient and may obscure subtle underlying pathology.  Cardiac silhouette appears mildly enlarged, status post median sternotomy. Similar interstitial prominence in central pulmonary vasculature engorgement. Mild patchy perihilar airspace opacities without pleural effusions. No pneumothorax.  Soft tissue planes and included osseous structures are unremarkable.  IMPRESSION: No apparent change in position of life support lines.  Mild cardiomegaly and interstitial prominence with perihilar airspace opacities concerning for pulmonary edema.   Electronically Signed   By: Elon Alas   On: 03/12/2013 04:57   ASSESSMENT / PLAN:  PULMONARY A: VDRF post arrest  P:   - Titrate O2 for sat of 88-92%. - OOB to chair as tolerated. - PT/OT. - Failed swallow, place panda and repeat in AM. - Diureses today.  CARDIOVASCULAR A: VF arrest - ? Primary event or secondary to prolonged seizure Sinus bradycardia on amiodarone Hypertension Recurrent VT off amiodarone  P:  - Cont PRN hydralazine and decrease MAP goal to 65. - Start cardene drip until able to get GI access and start PO norvasc 10 mg daily. - Will defer amio to cards.  RENAL A: Creat elevated but improving.  P:   - Monitor BMET daily. - Monitor I/Os. - Correct electrolytes as indicated. - Lasix 40 mg IV x2 doses.  GASTROINTESTINAL A:  No issues P:   - SUP: PPI. - Resume TF. - Place NGT. - Swallow evaluation in AM.  HEMATOLOGIC A: ANemia of critical illness Mild thrombocytopenia P:  - DVT px: SQ heparin. - Monitor CBC intermittently. - PRBC for hgb < 8gm% due to cardiac arrest.  INFECTIOUS A:  Temp 102F post rewarming.  Klebsiella in sputum.  P:   - D/C vanc/zosyn. - Start ampicillin PO.  ENDOCRINE A: DM2, controlled P:   - Holding home  glipizide and metformin. - Cont SSI.  NEUROLOGIC A:   Hydrocephalus, uncertain chronicity Post anoxic encephalopathy Seizures - ? New onset  P:   - Neuro to address hydrocephalus. - D/C all sedation. - Neuro following.  GLOBAL Airway protection remains a concern, weak cough and failed swallow evaluation.  Insert NGT and resume TF, start cardene drip and start PO Norvasc.  The patient is critically ill with multiple organ systems failure and requires high complexity decision making for assessment and support, frequent evaluation and titration of therapies, application of advanced monitoring technologies and extensive interpretation of multiple databases.   Critical Care Time devoted to patient care services described in this note is  35  Minutes.  Rush Farmer, M.D. [facility] Pulmonary/Critical Care Medicine. Pager: [phone]. After hours pager: [phone].

## 2013-03-14 ENCOUNTER — Encounter (HOSPITAL_COMMUNITY): Payer: Self-pay

## 2013-03-14 ENCOUNTER — Inpatient Hospital Stay (HOSPITAL_COMMUNITY): Payer: Medicaid Other

## 2013-03-14 LAB — GLUCOSE, CAPILLARY
GLUCOSE-CAPILLARY: 137 mg/dL — AB (ref 70–99)
GLUCOSE-CAPILLARY: 140 mg/dL — AB (ref 70–99)
Glucose-Capillary: 166 mg/dL — ABNORMAL HIGH (ref 70–99)
Glucose-Capillary: 117 mg/dL — ABNORMAL HIGH (ref 70–99)
Glucose-Capillary: 175 mg/dL — ABNORMAL HIGH (ref 70–99)
Glucose-Capillary: 140 mg/dL — ABNORMAL HIGH (ref 70–99)

## 2013-03-14 LAB — CULTURE, BLOOD (ROUTINE X 2)
Culture: NO GROWTH
Culture: NO GROWTH

## 2013-03-14 LAB — CBC WITH DIFFERENTIAL/PLATELET
Basophils Absolute: 0 10*3/uL (ref 0.0–0.1)
Basophils Relative: 0 % (ref 0–1)
EOS ABS: 0.2 10*3/uL (ref 0.0–0.7)
Eosinophils Relative: 2 % (ref 0–5)
HCT: 27.3 % — ABNORMAL LOW (ref 39.0–52.0)
Hemoglobin: 9.2 g/dL — ABNORMAL LOW (ref 13.0–17.0)
LYMPHS ABS: 1.3 10*3/uL (ref 0.7–4.0)
Lymphocytes Relative: 17 % (ref 12–46)
MCH: 30.9 pg (ref 26.0–34.0)
MCHC: 33.7 g/dL (ref 30.0–36.0)
MCV: 91.6 fL (ref 78.0–100.0)
Monocytes Absolute: 0.8 10*3/uL (ref 0.1–1.0)
Monocytes Relative: 10 % (ref 3–12)
Neutro Abs: 5.5 10*3/uL (ref 1.7–7.7)
Neutrophils Relative %: 70 % (ref 43–77)
Platelets: 197 10*3/uL (ref 150–400)
RBC: 2.98 MIL/uL — ABNORMAL LOW (ref 4.22–5.81)
RDW: 13.3 % (ref 11.5–15.5)
WBC: 7.8 10*3/uL (ref 4.0–10.5)

## 2013-03-14 LAB — BASIC METABOLIC PANEL
BUN: 24 mg/dL — AB (ref 6–23)
CHLORIDE: 111 meq/L (ref 96–112)
CO2: 23 meq/L (ref 19–32)
CREATININE: 0.98 mg/dL (ref 0.50–1.35)
Calcium: 8.7 mg/dL (ref 8.4–10.5)
GFR calc Af Amer: >90 mL/min (ref >90)
GFR calc non Af Amer: 88 mL/min — ABNORMAL LOW (ref >90)
Glucose, Bld: 155 mg/dL — ABNORMAL HIGH (ref 70–99)
Potassium: 4.1 mEq/L (ref 3.7–5.3)
Sodium: 148 mEq/L — ABNORMAL HIGH (ref 137–147)

## 2013-03-14 LAB — PHOSPHORUS: PHOSPHORUS: 3.5 mg/dL (ref 2.3–4.6)

## 2013-03-14 LAB — MAGNESIUM: Magnesium: 2.2 mg/dL (ref 1.5–2.5)

## 2013-03-14 MED ORDER — ACETAMINOPHEN 160 MG/5ML PO SOLN: 650.0000 mg | Freq: Four times a day (QID) | ORAL | Status: DC | PRN

## 2013-03-14 MED ORDER — LISINOPRIL 20 MG PO TABS
20.0000 mg | ORAL_TABLET | Freq: Once | ORAL | Status: AC
  Administered 2013-03-15: 20 mg via ORAL
  Filled 2013-03-14: qty 1

## 2013-03-14 MED ORDER — ACETAMINOPHEN 325 MG PO TABS
650.0000 mg | ORAL_TABLET | Freq: Four times a day (QID) | ORAL | Status: DC | PRN
  Administered 2013-03-25: 650 mg via ORAL
  Filled 2013-03-14: qty 2

## 2013-03-14 MED ORDER — LISINOPRIL 10 MG PO TABS
10.0000 mg | ORAL_TABLET | Freq: Every day | ORAL | Status: DC
  Administered 2013-03-14: 10 mg via ORAL
  Filled 2013-03-14: qty 1

## 2013-03-14 MED ORDER — METOPROLOL TARTRATE 50 MG PO TABS
50.0000 mg | ORAL_TABLET | Freq: Two times a day (BID) | ORAL | Status: DC
  Administered 2013-03-14 – 2013-03-15 (×3): 50 mg via ORAL
  Filled 2013-03-14 (×4): qty 1
  Filled 2013-03-14: qty 2

## 2013-03-14 MED ORDER — LISINOPRIL 20 MG PO TABS
20.0000 mg | ORAL_TABLET | Freq: Two times a day (BID) | ORAL | Status: DC
  Administered 2013-03-15 – 2013-03-24 (×19): 20 mg via ORAL
  Filled 2013-03-14 (×20): qty 1

## 2013-03-14 NOTE — Progress Notes (Signed)
Rehab Admissions Coordinator Note:  Patient was screened by Cleatrice Burke for appropriateness for an Inpatient Acute Rehab Consult.  At this time, we are recommending Inpatient Rehab consult.  Cleatrice Burke 03/14/2013, 2:59 PM  I can be reached at [phone].

## 2013-03-14 NOTE — Evaluation (Signed)
Physical Therapy Evaluation Patient Details Name: Eric [name] MRN: [id number] DOB: 1955/03/27 Today's Date: 03/14/2013 Time: [id number] PT Time Calculation (min): 22 min  PT Assessment / Plan / Recommendation History of Present Illness  58 yo with witnessed sz at [employer]. Apneic and pulseless at scene. CPR x 5 minutes, king airway placed by EMS, intubated in ED. 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 ETT 2/13-2/18  Clinical Impression  Pt pleasant gentleman with recent move from [place], [place] who enjoys playing piano. Pt with decreased strength, balance, safety and mobility who will benefit from acute therapy to maximize mobility, function and independence prior to DC to decrease burden of care and return pt to PLOF. Pt encouraged to be OOB with nursing but requires increased emphasis on need for assist as pt demonstrates lacking recognition of his deficits currently. Spouse and dgtr present throughout and also educated for HEP. Will follow.     PT Assessment  Patient needs continued PT services    Follow Up Recommendations  CIR;Supervision/Assistance - 24 hour    Does the patient have the potential to tolerate intense rehabilitation      Barriers to Discharge        Equipment Recommendations  Rolling walker with 5" wheels    Recommendations for Other Services Rehab consult;OT consult   Frequency Min 3X/week    Precautions / Restrictions Precautions Precautions: Fall   Pertinent Vitals/Pain HR 84-99 sats 95% on 5L, decreased to 90% on 4L BP 162/88 supine after No pain      Mobility  Bed Mobility Overal bed mobility: Needs Assistance Bed Mobility: Sit to Supine Sit to supine: Min assist General bed mobility comments: cueing for sequence with min assist to bring legs onto bed and cues for sequence to scoot and position in bed Transfers Overall transfer level: Needs assistance Equipment used: Rolling walker (2 wheeled) Transfers: Sit to/from  [facility] Sit to Stand: Min assist General transfer comment: pt with max cues for safety, hand placement and sequence with transfers as pt impulsive trying to stand and move despite poor balance.  Ambulation/Gait Ambulation/Gait assistance: Mod assist Ambulation Distance (Feet): 3 Feet Assistive device: Rolling walker (2 wheeled) Gait Pattern/deviations: Step-to pattern Gait velocity interpretation: Below normal speed for age/gender General Gait Details: pt with posterior lean and increased time in static standing before able to step due to poor balance. Max assist to direct and advance RW and assist for balance and sequence throughout with cues for safety    Exercises General Exercises - Lower Extremity Heel Slides: AROM;Both;10 reps;Supine Hip ABduction/ADduction: AROM;Both;10 reps;Supine   PT Diagnosis: Difficulty walking;Altered mental status;Generalized weakness  PT Problem List: Decreased strength;Decreased cognition;Decreased activity tolerance;Decreased safety awareness;Decreased knowledge of use of DME;Decreased balance;Decreased mobility PT Treatment Interventions: Gait training;DME instruction;Balance training;Functional mobility training;Patient/family education;Therapeutic activities;Therapeutic exercise;Neuromuscular re-education     PT Goals(Current goals can be found in the care plan section) Acute Rehab PT Goals Patient Stated Goal: return to playing piano and organ PT Goal Formulation: With patient/family Time For Goal Achievement: 03/28/13 Potential to Achieve Goals: Good  Visit Information  Last PT Received On: 03/14/13 Assistance Needed: +2 (safety with gait) History of Present Illness: 58 yo with witnessed sz at [employer]. Apneic and pulseless at scene. CPR x 5 minutes, king airway placed by EMS, intubated in ED. 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 ETT 2/13-2/18       Prior [place]  expects to be discharged to:: Private  residence Living Arrangements: Spouse/significant other;Children Available Help at Discharge: Family;Available 24 hours/day Type of Home: House Home Access: Level entry Home Layout: One level Home Equipment: None Prior Function Level of Independence: Independent Communication Communication: No difficulties (soft spoken)    Cognition  Cognition Arousal/Alertness: Awake/alert Behavior During Therapy: Flat affect;Impulsive Overall Cognitive Status: Impaired/Different from baseline Area of Impairment: Memory;Problem solving;Safety/judgement Memory: Decreased short-term memory Safety/Judgement: Decreased awareness of deficits;Decreased awareness of safety Problem Solving: Difficulty sequencing;Slow processing;Requires verbal cues;Requires tactile cues    Extremity/Trunk Assessment Upper Extremity Assessment Upper Extremity Assessment: Generalized weakness Lower Extremity Assessment Lower Extremity Assessment: Generalized weakness Cervical / Trunk Assessment Cervical / Trunk Assessment: Normal   Balance Balance Overall balance assessment: Needs assistance Sitting-balance support: No upper extremity supported;Feet supported Sitting balance-Leahy Scale: Good Standing balance-Leahy Scale: Poor  End of Session PT - End of Session Equipment Utilized During Treatment: Gait belt;Oxygen Activity Tolerance: Patient tolerated treatment well Patient left: in bed;with call bell/phone within reach;with family/visitor present Nurse Communication: Mobility status;Precautions  GP     Lanetta Inch Beth 03/14/2013, 2:24 PM Elwyn Reach, [place]

## 2013-03-14 NOTE — Progress Notes (Signed)
Speech Language Pathology Treatment: Dysphagia  Patient Details Name: Eric [name] MRN: [id number] DOB: [birth year] Today's Date: 03/14/2013 Time: [id number] SLP Time Calculation (min): 14 min  Assessment / Plan / Recommendation Clinical Impression  Pt with limited clinical improvements in swallow function since yesterday's assessment.  Continues with aphonia, weak cough, likely decreased sensation s/p prolonged intubation.  Trials of thins, nectars, and purees produced persisting s/s of dysphagia with high risk of silent aspiration at this time.  Pt not ready for PO diet, but may have meds crushed in puree; recommend continued NPO status.  Will continue to follow daily for improvements.     HPI HPI: 58 yo with h/o CAD s/p CABG, DM, HTN admitted following witnessed sz at [employer]. Apneic and pulseless at scene. CPR x 5 minutes, king airway placed by EMS, intubated in ED. 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 Head CT showed hydrocephalus with diffuse dilatation fo the lateral and third ventricles. Intubated 2/13-2/18. Per MD notes unclear if this is acute hydrocephalus with increased ICP ( however, CT brain without evidence of mass effect) causing seizures and then a cardiac event, or perhaps a primary cardiac issue leading to seizures with an incidental finding of chronic obstructive hydrocephalus.       SLP Plan  Continue with current plan of care    Recommendations Diet recommendations: NPO Medication Administration: Crushed with puree              Oral Care Recommendations: Oral care Q4 per protocol Plan: Continue with current plan of care    Eric [name] Eric [name], [place] CCC/SLP Pager [phone]      Eric [name] 03/14/2013, 1:23 PM

## 2013-03-14 NOTE — Progress Notes (Signed)
Subjective:  Patient is more awake up in chair today.  Able to take p.o. Now.  Lopressor was switched to IV yesterday  Objective:  Vital Signs in the last 24 hours: Temp:  [98.2 F (36.8 C)-99.1 F (37.3 C)] 98.4 F (36.9 C) (02/20 1000) Pulse Rate:  [78-98] 92 (02/20 1100) Resp:  [19-33] 27 (02/20 1100) BP: (129-160)/(61-83) 160/81 mmHg (02/20 1100) SpO2:  [83 %-97 %] 97 % (02/20 1100) Arterial Line BP: (111-160)/(64-96) 156/76 mmHg (02/20 0321) Weight:  [74.3 kg (163 lb 12.8 oz)] 74.3 kg (163 lb 12.8 oz) (02/20 0419)  Intake/Output from previous day: 02/19 0701 - 02/20 0700 In: 2077.8 [I.V.:1877.8; IV Piggyback:200] Out: [id number] [Urine:4025] Intake/Output from this shift: Total I/O In: 251.7 [I.V.:151.7; IV Piggyback:100] Out: 300 [Urine:300]  Physical Exam: Neck: no adenopathy, no carotid bruit, no JVD and supple, symmetrical, trachea midline Lungs: decreased breath sounds at bases Heart: regular rate and rhythm, S1, S2 normal and soft systolic murmur noted Abdomen: soft, non-tender; bowel sounds normal; no masses,  no organomegaly Extremities: extremities normal, atraumatic, no cyanosis or edema  Lab Results:  Recent Labs  03/13/13 0345 03/14/13 0325  WBC 6.7 7.8  HGB 9.0* 9.2*  PLT 161 197    Recent Labs  03/13/13 0345 03/14/13 0325  NA 144 148*  K 3.8 4.1  CL 105 111  CO2 23 23  GLUCOSE 138* 155*  BUN 25* 24*  CREATININE 1.08 0.98   No results found for this basename: TROPONINI, CK, MB,  in the last 72 hours Hepatic Function Panel No results found for this basename: PROT, ALBUMIN, AST, ALT, ALKPHOS, BILITOT, BILIDIR, IBILI,  in the last 72 hours No results found for this basename: CHOL,  in the last 72 hours No results found for this basename: PROTIME,  in the last 72 hours  Imaging: Imaging results have been reviewed and Mr Jeri Cos Wo Contrast  03/13/2013   CLINICAL DATA:  Recent cardiac arrest. Seizure and altered mental status. Ventriculomegaly on  CT.  EXAM: MRI HEAD WITHOUT AND WITH CONTRAST  TECHNIQUE: Multiplanar, multiecho pulse sequences of the brain and surrounding structures were obtained without and with intravenous contrast.  CONTRAST:  63mL MULTIHANCE GADOBENATE DIMEGLUMINE 529 MG/ML IV SOLN  COMPARISON:  Head CT 03/07/2013  FINDINGS: Images are moderately degraded by motion artifact. The genu and anterior body of the corpus callosum are present, however the posterior body and splenium are not clearly identified. There is dilatation of the lateral and third ventricles, with marked dilatation of the atria of the lateral ventricles. The fourth ventricle is nondilated. The cerebral aqueduct appears grossly patent without tectal region mass seen. Periventricular T2 hyperintensities suggestive of transependymal CSF flow is identified.  There is no evidence of acute infarct. There is no evidence of mass, midline shift, or extra-axial fluid collection. Small foci of T2 hyperintensity within the cerebral white matter are nonspecific but compatible with mild chronic small vessel ischemic disease. Small foci of susceptibility artifact are seen in the anterior right frontal lobe and at the margin of the right frontal horn, compatible with remote hemorrhage. There is also evidence of a small amount of remote hemorrhage in the region of the right basal ganglia. No gross abnormal enhancement is identified.  Major intracranial vascular flow voids are grossly preserved. Orbits are unremarkable. Paranasal sinuses and mastoid air cells are clear.  IMPRESSION: 1. Moderate motion artifact. No evidence of acute intracranial abnormality. 2. Ventricular dilatation, which is felt to be congenital/developmental rather than representing  acute hydrocephalus, with corpus callosum dysgenesis. These results were called by telephone at the time of interpretation on 03/13/2013 at 7:20 PM to Dr. Leonel Ramsay, who verbally acknowledged these results.   Electronically Signed   By:  Logan Bores   On: 03/13/2013 19:46   Dg Chest Port 1 View  03/14/2013   CLINICAL DATA:  Central line placement.  EXAM: PORTABLE CHEST - 1 VIEW  COMPARISON:  03/13/2013  FINDINGS: Prior CABG. Left central line is in place with the tip at the cavoatrial junction. No pneumothorax.  Cardiomegaly with vascular congestion and bilateral airspace opacities, likely edema. No real change.  IMPRESSION: Left central line tip at the cavoatrial junction.  No pneumothorax.  Bilateral airspace disease, likely edema, unchanged.   Electronically Signed   By: Rolm Baptise M.D.   On: 03/14/2013 05:09   Dg Chest Port 1 View  03/13/2013   CLINICAL DATA:  Status post nasogastric tube placement  EXAM: PORTABLE CHEST - 1 VIEW  COMPARISON:  DG CHEST 1V PORT dated 03/13/2013  FINDINGS: The nasogastric tube tip and proximal port lie well below the GE junction. The tube is coiled upon itself within the gastric fundus. The lungs are borderline hypoinflated. The interstitial markings remain increased bilaterally. The cardiac silhouette remains enlarged and the pulmonary vascularity remains engorged. The left internal jugular venous catheter tip lies in the region of the proximal to mid SVC.  IMPRESSION: Interval placement of a nasogastric tube reveals reasonable positioning below the level of the GE junction.   Electronically Signed   By: David  Martinique   On: 03/13/2013 15:33   Dg Chest Port 1 View  03/13/2013   CLINICAL DATA:  Endotracheal tube position, shortness of breath.  EXAM: PORTABLE CHEST - 1 VIEW  COMPARISON:  DG CHEST 1V PORT dated 03/12/2013  FINDINGS: Interval extubation. Interval removal of nasogastric tube. Left internal jugular central venous catheter with distal tip projecting in mid superior vena cava remains.  Stable mild moderate cardiomegaly, status post median sternotomy. Central pulmonary vasculature congestion interstitial prominence, slightly interstitial prominence. No pleural effusions. No pneumothorax.  Multiple  EKG lines overlie the patient and may obscure subtle underlying pathology. Soft tissue planes and included osseous structures are unremarkable.  IMPRESSION: Interval extubation removal of nasogastric tube. No apparent change in position of left IJ line.  Stable cardiomegaly and worsening interstitial edema.   Electronically Signed   By: Elon Alas   On: 03/13/2013 05:58    Cardiac Studies:  Assessment/Plan:  Status post recurrent V. fib cardiac arrest  Status post Vent dependent respiratory failure secondary to above rule out aspiration  Coronary artery disease status post CABG STATUS post left cath small vessel disease  Hypertension  Diabetes mellitus  Hypercholesteremia  Status post seizures  Plan Restart Lopressor as per orders Add lisinopril as per orders Wean off Cardene drip Check 2-D echo  LOS: 7 days    Pietro Bonura N 03/14/2013, 12:00 PM

## 2013-03-14 NOTE — Progress Notes (Signed)
03/14/2013 1515  Transferred to SDU RM 2H28   Same nurse providing care. Leviathan Macera, Carolynn Comment

## 2013-03-14 NOTE — Progress Notes (Signed)
Report received from [facility]; pt alert, disoriented to place and time currently; reorientation provided; lungs diminished c O2 2L [place]; VS per flowsheet; will continue to closely monitor

## 2013-03-14 NOTE — Progress Notes (Signed)
Pulmonary/Critical Care Progress Note   Name: Eric [name] MRN: [id number] DOB: [birth year]    ADMISSION DATE:  03/07/2013    REFERRING MD :  EDP PRIMARY SERVICE: PCCM  CHIEF COMPLAINT: Post arrest   BRIEF PATIENT DESCRIPTION:  58 yo with witnessed sz at [employer]. Apneic and pulseless at scene. CPR x 5 minutes, king airway placed by EMS, intubated in ED. 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    SIGNIFICANT EVENTS / STUDIES:  2/13 CT head:  Hydrocephalus, with diffuse dilatation of the lateral and third ventricles. 2/13 Cards consult Terrence Dupont) and LHC: Cardiac cath with severe native vessel coronary artery disease but patent SVGs and LIMA 2/13 Neuro Consult: MRI and EEG recommended 2/14 EEG: This is an abnormal EEG due to a discontinuous slow background. No epileptiform activity is noted. This finding is consistent with the patient's current medications.  2/14 Amiodarone discontinued due to sinus bradycardia 03/09/13: Rewarming started last night. Not yet @ target temp. Episode of VT last PM  LINES / TUBES: R fem CVL 2/13 >> 2/14 ETT 2/13 >>2/18 L IJ CVL 2/13 >> R fem A-line 2/13 >>  CULTURES: Urine 2/13 >> NEG Blood 2/13 >> NTD Sputum 2/14>>>Klebsiella  ANTIBIOTICS: Vancomycin 2/17>>>2/19 Zosyn 2/17>>>2/19 Amp 2/19>>>  SUBJECTIVE: No events overnight, much more alert and interactive.  VITAL SIGNS: Temp:  [98.2 F (36.8 C)-99.1 F (37.3 C)] 98.6 F (37 C) (02/20 0900) Pulse Rate:  [78-98] 95 (02/20 0900) Resp:  [19-43] 24 (02/20 0900) BP: (129-154)/(61-83) 141/80 mmHg (02/20 0900) SpO2:  [83 %-98 %] 92 % (02/20 0900) Arterial Line BP: (111-209)/(64-96) 156/76 mmHg (02/20 0321) Weight:  [74.3 kg (163 lb 12.8 oz)] 74.3 kg (163 lb 12.8 oz) (02/20 0419) HEMODYNAMICS:   VENTILATOR SETTINGS:   INTAKE / OUTPUT: Intake/Output     02/19 0701 - 02/20 0700 02/20 0701 - 02/21 0700   I.V. (mL/kg) 1877.8 (25.3) 90 (1.2)   NG/GT     IV Piggyback 200     Total Intake(mL/kg) 2077.8 (28) 90 (1.2)   Urine (mL/kg/hr) 4025 (2.3)    Total Output 4025     Net -1947.2 +90         PHYSICAL EXAMINATION: General: Chronically ill appearing, awake and following command. Neuro: Arousable, follows commands.  Moves all ext to command. HEENT: pupils symmetric but reactive. Cardiovascular:  RRR, Nl S1/S2, -M/R/G. Lungs: Clear anteriorly, no wheezes Abdomen: Soft, nondistended Ext: no edema  PULMONARY  Recent Labs Lab 03/07/13 1705 03/07/13 2314 03/08/13 0426 03/12/13 0317 03/12/13 1136 03/13/13 0500  PHART 7.408  --  7.407 7.399 7.396 7.403  PCO2ART 43.1  --  36.8 36.5 39.7 36.8  PO2ART 282.0*  --  180.0* 110.0* 95.0 135.0*  HCO3 27.2*  --  23.8 22.1 24.2* 22.5  TCO2 28 21 25.2 23.2 25 23.6  O2SAT 100.0  --  99.6 98.4 97.0 99.4   CBC  Recent Labs Lab 03/12/13 0410 03/13/13 0345 03/14/13 0325  HGB 9.1* 9.0* 9.2*  HCT 26.2* 27.3* 27.3*  WBC 5.4 6.7 7.8  PLT 130* 161 197   COAGULATION  Recent Labs Lab 03/07/13 1136 03/07/13 1219 03/07/13 2000  INR 1.21 1.28 1.32   CARDIAC  Recent Labs Lab 03/07/13 1136 03/11/13 0340  TROPONINI <0.30 <0.30    Recent Labs Lab 03/11/13 0340  PROBNP 370.5*   CHEMISTRY  Recent Labs Lab 03/07/13 1219  03/09/13 0015  03/10/13 0400 03/11/13 0344 03/12/13 0410 03/13/13 0345 03/14/13 0325  NA 136*  < >  138  < > 142 142 141 144 148*  K 3.9  < > 3.9  < > 4.2 4.2 3.9 3.8 4.1  CL 99  < > 104  < > 109 107 108 105 111  CO2 21  < > 21  < > 22 20 20 23 23   GLUCOSE 229*  < > 105*  < > 155* 160* 195* 138* 155*  BUN 19  < > 21  < > 24* 27* 28* 25* 24*  CREATININE 1.06  < > 0.96  < > 1.65* 1.56* 1.05 1.08 0.98  CALCIUM 8.5  < > 8.0*  < > 7.7* 8.2* 8.1* 8.2* 8.7  MG 1.5  --  1.6  --   --  2.2 2.4 2.1 2.2  PHOS 3.8  --   --   --   --  4.1 2.4 3.1 3.5  < > = values in this interval not displayed. Estimated Creatinine Clearance: 85.3 ml/min (by C-G formula based on Cr of  0.98).  LIVER  Recent Labs Lab 03/07/13 1136 03/07/13 1219 03/07/13 2000 03/09/13 0400  AST 49* 45*  --  71*  ALT 59* 54*  --  71*  ALKPHOS 61 59  --  50  BILITOT 0.3 0.4  --  0.6  PROT 8.2 7.6  --  7.0  ALBUMIN 2.6* 2.3*  --  2.0*  INR 1.21 1.28 1.32  --    INFECTIOUS  Recent Labs Lab 03/07/13 1219 03/10/13 1130 03/11/13 0344 03/11/13 0500 03/12/13 0410  LATICACIDVEN 2.7* 0.9  --  1.7  --   PROCALCITON <0.10 11.11 6.28  --  4.74   ENDOCRINE CBG (last 3)   Recent Labs  03/13/13 2313 03/14/13 0417 03/14/13 0731  GLUCAP 137* 166* 117*   IMAGING x48h  Mr Brain W Wo Contrast  03/13/2013   CLINICAL DATA:  Recent cardiac arrest. Seizure and altered mental status. Ventriculomegaly on CT.  EXAM: MRI HEAD WITHOUT AND WITH CONTRAST  TECHNIQUE: Multiplanar, multiecho pulse sequences of the brain and surrounding structures were obtained without and with intravenous contrast.  CONTRAST:  32mL MULTIHANCE GADOBENATE DIMEGLUMINE 529 MG/ML IV SOLN  COMPARISON:  Head CT 03/07/2013  FINDINGS: Images are moderately degraded by motion artifact. The genu and anterior body of the corpus callosum are present, however the posterior body and splenium are not clearly identified. There is dilatation of the lateral and third ventricles, with marked dilatation of the atria of the lateral ventricles. The fourth ventricle is nondilated. The cerebral aqueduct appears grossly patent without tectal region mass seen. Periventricular T2 hyperintensities suggestive of transependymal CSF flow is identified.  There is no evidence of acute infarct. There is no evidence of mass, midline shift, or extra-axial fluid collection. Small foci of T2 hyperintensity within the cerebral white matter are nonspecific but compatible with mild chronic small vessel ischemic disease. Small foci of susceptibility artifact are seen in the anterior right frontal lobe and at the margin of the right frontal horn, compatible with remote  hemorrhage. There is also evidence of a small amount of remote hemorrhage in the region of the right basal ganglia. No gross abnormal enhancement is identified.  Major intracranial vascular flow voids are grossly preserved. Orbits are unremarkable. Paranasal sinuses and mastoid air cells are clear.  IMPRESSION: 1. Moderate motion artifact. No evidence of acute intracranial abnormality. 2. Ventricular dilatation, which is felt to be congenital/developmental rather than representing acute hydrocephalus, with corpus callosum dysgenesis. These results were called by telephone at  the time of interpretation on 03/13/2013 at 7:20 PM to Dr. Leonel Ramsay, who verbally acknowledged these results.   Electronically Signed   By: Logan Bores   On: 03/13/2013 19:46   Dg Chest Port 1 View  03/14/2013   CLINICAL DATA:  Central line placement.  EXAM: PORTABLE CHEST - 1 VIEW  COMPARISON:  03/13/2013  FINDINGS: Prior CABG. Left central line is in place with the tip at the cavoatrial junction. No pneumothorax.  Cardiomegaly with vascular congestion and bilateral airspace opacities, likely edema. No real change.  IMPRESSION: Left central line tip at the cavoatrial junction.  No pneumothorax.  Bilateral airspace disease, likely edema, unchanged.   Electronically Signed   By: Rolm Baptise M.D.   On: 03/14/2013 05:09   Dg Chest Port 1 View  03/13/2013   CLINICAL DATA:  Status post nasogastric tube placement  EXAM: PORTABLE CHEST - 1 VIEW  COMPARISON:  DG CHEST 1V PORT dated 03/13/2013  FINDINGS: The nasogastric tube tip and proximal port lie well below the GE junction. The tube is coiled upon itself within the gastric fundus. The lungs are borderline hypoinflated. The interstitial markings remain increased bilaterally. The cardiac silhouette remains enlarged and the pulmonary vascularity remains engorged. The left internal jugular venous catheter tip lies in the region of the proximal to mid SVC.  IMPRESSION: Interval placement of a  nasogastric tube reveals reasonable positioning below the level of the GE junction.   Electronically Signed   By: David  Martinique   On: 03/13/2013 15:33   Dg Chest Port 1 View  03/13/2013   CLINICAL DATA:  Endotracheal tube position, shortness of breath.  EXAM: PORTABLE CHEST - 1 VIEW  COMPARISON:  DG CHEST 1V PORT dated 03/12/2013  FINDINGS: Interval extubation. Interval removal of nasogastric tube. Left internal jugular central venous catheter with distal tip projecting in mid superior vena cava remains.  Stable mild moderate cardiomegaly, status post median sternotomy. Central pulmonary vasculature congestion interstitial prominence, slightly interstitial prominence. No pleural effusions. No pneumothorax.  Multiple EKG lines overlie the patient and may obscure subtle underlying pathology. Soft tissue planes and included osseous structures are unremarkable.  IMPRESSION: Interval extubation removal of nasogastric tube. No apparent change in position of left IJ line.  Stable cardiomegaly and worsening interstitial edema.   Electronically Signed   By: Elon Alas   On: 03/13/2013 05:58   ASSESSMENT / PLAN:  PULMONARY A: VDRF post arrest  P:   - Titrate O2 for sat of 88-92%. - OOB to chair as tolerated. - PT/OT. - Failed swallow 2/19, repeat today, if fails then place panda. - Hold further diureses today.  CARDIOVASCULAR A: VF arrest - ? Primary event or secondary to prolonged seizure Sinus bradycardia on amiodarone Hypertension Recurrent VT off amiodarone  P:  - Cont PRN hydralazine and decrease MAP goal to 65. - Start cardene drip until able to get GI access and start PO norvasc 10 mg daily. - Will defer amio to cards.  RENAL A: Creat elevated but improving.  P:   - Monitor BMET daily. - Monitor I/Os. - Correct electrolytes as indicated. - Hold further diureses for now.  GASTROINTESTINAL A:  No issues P:   - SUP: PPI. - Resume TF or diet pending swallow evaluation. -  Place NGT if fails swallow evaluation.  HEMATOLOGIC A: ANemia of critical illness Mild thrombocytopenia P:  - DVT px: SQ heparin. - Monitor CBC intermittently. - PRBC for hgb < 8gm% due to cardiac arrest.  INFECTIOUS A:  Temp 102F post rewarming.  Klebsiella in sputum.  P:   - D/Ced vanc/zosyn 2/19. - Started ampicillin PO but changed to unasyn after patient pulled his NGT.  ENDOCRINE A: DM2, controlled P:   - Holding home glipizide and metformin. - Cont SSI.  NEUROLOGIC A:   Hydrocephalus, uncertain chronicity Post anoxic encephalopathy Seizures - ? 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 - D/C all sedation. - Neuro following.  GLOBAL Transfer to SDU, swallow evaluation today, if fails then NGT and continue PO anti-HTN.  Transfer to [facility], PCCM will sign off, please call back if needed.  Rush Farmer, M.D. [facility] Pulmonary/Critical Care Medicine. Pager: [phone]. After hours pager: [phone].

## 2013-03-14 NOTE — Progress Notes (Addendum)
Subjective: Continues to show improvement in mental status.   Family denies previous staring spells, or other episodes concerning for seizure.   Exam: Filed Vitals:   03/14/13 0600  BP: 144/61  Pulse: 90  Temp: 98.6 F (37 C)  Resp: 31   Gen: In bed, NAD MS: Awake, Alert, answers questions with nods/shakes. Appears to understand, he is able to verbalize with breathy voice. Able to tell me that he is in a hospital but not which one, states year is 2010.  [id number], EOMI Motor: Follows command sx 4.  Sensory:intact to LT.   Impression: 58 yo M s/p arrest and ? Seizure. Not clear if seizure -> arret vs arrest -> convulsive syncope or seizure. 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 There is significant doubt that this represents seizure as a primary event, and if it was this would be his first unprovoked seizure. I would not favor starting an AED in this setting with no epileptiform activity on EEG.   I suspect that he has a mild encephalopathy secondary to his anoxic event possibly coupled with ICU delirium, but this should continue to improve with time, though whether he will have long term mild problems is difficult to say with certainty.   Recommendations: 1) No further recommendations at this time. Please call with any further questions.   Roland Rack, MD [facility] [phone]  If 7pm- 7am, please page neurology on call at [phone].

## 2013-03-14 NOTE — Progress Notes (Signed)
  Echocardiogram 2D Echocardiogram has been performed.  Eric [name] FRANCES 03/14/2013, 5:44 PM

## 2013-03-15 ENCOUNTER — Inpatient Hospital Stay (HOSPITAL_COMMUNITY): Payer: Medicaid Other

## 2013-03-15 LAB — CBC
HEMATOCRIT: 28.1 % — AB (ref 39.0–52.0)
Hemoglobin: 9.2 g/dL — ABNORMAL LOW (ref 13.0–17.0)
MCH: 30.6 pg (ref 26.0–34.0)
MCHC: 32.7 g/dL (ref 30.0–36.0)
MCV: 93.4 fL (ref 78.0–100.0)
PLATELETS: 206 10*3/uL (ref 150–400)
RBC: 3.01 MIL/uL — ABNORMAL LOW (ref 4.22–5.81)
RDW: 13.4 % (ref 11.5–15.5)
WBC: 8.9 10*3/uL (ref 4.0–10.5)

## 2013-03-15 LAB — GLUCOSE, CAPILLARY
GLUCOSE-CAPILLARY: 160 mg/dL — AB (ref 70–99)
GLUCOSE-CAPILLARY: 156 mg/dL — AB (ref 70–99)
Glucose-Capillary: 162 mg/dL — ABNORMAL HIGH (ref 70–99)
Glucose-Capillary: 187 mg/dL — ABNORMAL HIGH (ref 70–99)
Glucose-Capillary: 192 mg/dL — ABNORMAL HIGH (ref 70–99)
Glucose-Capillary: 180 mg/dL — ABNORMAL HIGH (ref 70–99)

## 2013-03-15 LAB — MAGNESIUM: Magnesium: 2.5 mg/dL (ref 1.5–2.5)

## 2013-03-15 LAB — BASIC METABOLIC PANEL
BUN: 25 mg/dL — AB (ref 6–23)
CALCIUM: 8.9 mg/dL (ref 8.4–10.5)
CO2: 25 mEq/L (ref 19–32)
CREATININE: 0.88 mg/dL (ref 0.50–1.35)
Chloride: 113 mEq/L — ABNORMAL HIGH (ref 96–112)
Glucose, Bld: 174 mg/dL — ABNORMAL HIGH (ref 70–99)
Potassium: 4 mEq/L (ref 3.7–5.3)
Sodium: 152 mEq/L — ABNORMAL HIGH (ref 137–147)

## 2013-03-15 LAB — PHOSPHORUS: Phosphorus: 3.8 mg/dL (ref 2.3–4.6)

## 2013-03-15 MED ORDER — ATORVASTATIN CALCIUM 40 MG PO TABS
40.0000 mg | ORAL_TABLET | Freq: Every day | ORAL | Status: DC
  Administered 2013-03-15 – 2013-03-24 (×10): 40 mg via ORAL
  Filled 2013-03-15 (×11): qty 1

## 2013-03-15 MED ORDER — ASPIRIN 81 MG PO CHEW
81.0000 mg | CHEWABLE_TABLET | Freq: Every day | ORAL | Status: DC
  Administered 2013-03-16 – 2013-03-25 (×10): 81 mg via ORAL
  Filled 2013-03-15 (×11): qty 1

## 2013-03-15 MED ORDER — DEXTROSE 5 % IV SOLN
INTRAVENOUS | Status: DC
  Administered 2013-03-15: 75 mL via INTRAVENOUS
  Administered 2013-03-16: 11:00:00 via INTRAVENOUS

## 2013-03-15 MED ORDER — SPIRONOLACTONE 12.5 MG HALF TABLET
12.5000 mg | ORAL_TABLET | Freq: Every day | ORAL | Status: DC
  Administered 2013-03-15 – 2013-03-17 (×3): 12.5 mg via ORAL
  Filled 2013-03-15 (×3): qty 1

## 2013-03-15 MED ORDER — METOPROLOL TARTRATE 100 MG PO TABS
100.0000 mg | ORAL_TABLET | Freq: Two times a day (BID) | ORAL | Status: DC
  Filled 2013-03-15: qty 1

## 2013-03-15 MED ORDER — METOPROLOL TARTRATE 50 MG PO TABS
50.0000 mg | ORAL_TABLET | Freq: Three times a day (TID) | ORAL | Status: DC
  Administered 2013-03-15 – 2013-03-17 (×7): 50 mg via ORAL
  Filled 2013-03-15: qty 1
  Filled 2013-03-15: qty 2
  Filled 2013-03-15 (×8): qty 1
  Filled 2013-03-15: qty 2

## 2013-03-15 MED ORDER — FUROSEMIDE 10 MG/ML IJ SOLN
40.0000 mg | Freq: Once | INTRAMUSCULAR | Status: AC
  Administered 2013-03-15: 40 mg via INTRAVENOUS
  Filled 2013-03-15: qty 4

## 2013-03-15 NOTE — Progress Notes (Signed)
[place] TEAM 1 - Stepdown/ICU TEAM Progress Note  Trinton Macfadyen [id number] DOB: April 18, 1955 DOA: 03/07/2013 PCP: No primary provider on file.  Admit HPI / Brief Narrative: 58 yo with witnessed convulstions at a [employer]. Apneic and pulseless at scene. CPR x 5 minutes , king airway placed by EMS, intubated in ED. Receives his care in [place]. 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   SIGNIFICANT EVENTS / STUDIES:  2/13 CT head: Hydrocephalus, with diffuse dilatation of the lateral and third ventricles.  2/13 Cards consult Terrence Dupont) and LHC: Cardiac cath with severe native vessel coronary artery disease but patent SVGs and LIMA  2/13 Neuro Consult 2/14 EEG: discontinuous slow background - no epileptiform activity noted 2/14 Amiodarone discontinued due to sinus bradycardia  03/09/13: Rewarming started - Episode of VT  ETT 2/13 >>2/18  HPI/Subjective: Pt is alert and conversant.  Denies ha, cp, n/v, or abdom pain.  Is begging to be allowed to drink water.    Assessment/Plan:  VF arrest ? Primary event or secondary to prolonged seizure - Cardiology following - f/u TTE pending   VDRF post arrest Extubated 2/18 - no evidence of primary pulmonary issues   Sinus bradycardia on amiodarone  Resolved w/ d/c of amio   Recurrent VT off amiodarone As per Cardiology  Uncontrolled Hypertension  BP remains above goal - adjust tx and follow trend  Hypernatremia  Needs free water - await results of SLP recheck - hope to be able to allow oral intake - if not, will have to replace NG for water/feeds   Anemia of critical illness  Keep Hgb 8.0 or > - stable at present  Mild thrombocytopenia Resolved  Klebsiella in sputum Remains on unasyn - afeb - WBC normal   DM2 controlled at present - follow trend   Hydrocephalus Appears congenital on MRI - no further interventions at this time  Encephalopathy  secondary to his anoxic event possibly coupled with ICU  delirium - appears to be steadily and rapidly improving   Seizures Not clear if seizure > arrest vs arrest > convulsive syncope or seizure - significant doubt that this represents seizure as a primary event - not on AED - EEG w/o epileptiform activity   Nutrition  To have NG placed to resume tube feeds if unable to pass SLP eval today    Code Status: FULL Family Communication: spoke w/ wife and daughter at bedside  Disposition Plan: SDU  Consultants: PCCM Cardiology Neurology   Antibiotics: Vancomycin 2/17>>>2/19  Zosyn 2/17>>>2/19  Unasyn 2/19>>>  DVT prophylaxis: SQ heparin   Objective: Blood pressure 181/91, pulse 100, temperature 98.9 F (37.2 C), temperature source Oral, resp. rate 31, height 6' (1.829 m), weight 75.2 kg (165 lb 12.6 oz), SpO2 90.00%.  Intake/Output Summary (Last 24 hours) at 03/15/13 1106 Last data filed at 03/15/13 1000  Gross per 24 hour  Intake    676 ml  Output   1700 ml  Net  -1024 ml   Exam: General: No acute respiratory distress Lungs: Clear to auscultation bilaterally without wheezes or crackles Cardiovascular: Regular rate and rhythm without murmur gallop or rub  Abdomen: Nontender, nondistended, soft, bowel sounds positive, no rebound, no ascites, no appreciable mass Extremities: No significant cyanosis, clubbing, or edema bilateral lower extremities  Data Reviewed: Basic Metabolic Panel:  Recent Labs Lab 03/11/13 0344 03/12/13 0410 03/13/13 0345 03/14/13 0325 03/15/13 0405  NA 142 141 144 148* 152*  K 4.2 3.9 3.8 4.1 4.0  CL  107 108 105 111 113*  CO2 20 20 23 23 25   GLUCOSE 160* 195* 138* 155* 174*  BUN 27* 28* 25* 24* 25*  CREATININE 1.56* 1.05 1.08 0.98 0.88  CALCIUM 8.2* 8.1* 8.2* 8.7 8.9  MG 2.2 2.4 2.1 2.2 2.5  PHOS 4.1 2.4 3.1 3.5 3.8   Liver Function Tests:  Recent Labs Lab 03/09/13 0400  AST 71*  ALT 71*  ALKPHOS 50  BILITOT 0.6  PROT 7.0  ALBUMIN 2.0*   CBC:  Recent Labs Lab 03/11/13 0344  03/12/13 0410 03/13/13 0345 03/14/13 0325 03/15/13 0405  WBC 6.9 5.4 6.7 7.8 8.9  NEUTROABS 5.6 4.1 4.6 5.5  --   HGB 10.2* 9.1* 9.0* 9.2* 9.2*  HCT 29.5* 26.2* 27.3* 27.3* 28.1*  MCV 91.0 90.7 91.6 91.6 93.4  PLT 143* 130* 161 197 206   Cardiac Enzymes:  Recent Labs Lab 03/10/13 1130 03/11/13 0340  CKTOTAL 364*  --   CKMB 1.9  --   TROPONINI  --  <0.30   BNP (last 3 results)  Recent Labs  03/11/13 0340  PROBNP 370.5*   CBG:  Recent Labs Lab 03/14/13 1526 03/14/13 1940 03/15/13 0030 03/15/13 0328 03/15/13 0713  GLUCAP 140* 140* 162* 160* 156*    Recent Results (from the past 240 hour(s))  URINE CULTURE     Status: None   Collection Time    03/07/13 12:19 PM      Result Value Ref Range Status   Specimen Description URINE, CATHETERIZED   Final   Special Requests NONE   Final   Culture  Setup Time     Final   Value: 03/07/2013 13:23     Performed at [facility] Count     Final   Value: NO GROWTH     Performed at [facility]   Culture     Final   Value: NO GROWTH     Performed at [facility]   Report Status 03/08/2013 FINAL   Final  MRSA PCR SCREENING     Status: None   Collection Time    03/07/13  2:35 PM      Result Value Ref Range Status   MRSA by PCR NEGATIVE  NEGATIVE Final   Comment:            The GeneXpert MRSA Assay (FDA     approved for NASAL specimens     only), is one component of a     comprehensive MRSA colonization     surveillance program. It is not     intended to diagnose MRSA     infection nor to guide or     monitor treatment for     MRSA infections.  CULTURE, BLOOD (ROUTINE X 2)     Status: None   Collection Time    03/07/13  7:42 PM      Result Value Ref Range Status   Specimen Description BLOOD LEFT HAND   Final   Special Requests BOTTLES DRAWN AEROBIC ONLY 1CC   Final   Culture  Setup Time     Final   Value: 03/08/2013 00:40     Performed at [facility]   Culture      Final   Value: NO GROWTH 5 DAYS     Performed at [facility]   Report Status 03/14/2013 FINAL   Final  CULTURE, BLOOD (ROUTINE X 2)     Status: None  Collection Time    03/07/13  7:53 PM      Result Value Ref Range Status   Specimen Description BLOOD RIGHT HAND   Final   Special Requests BOTTLES DRAWN AEROBIC ONLY 1CC   Final   Culture  Setup Time     Final   Value: 03/08/2013 00:40     Performed at [facility]   Culture     Final   Value: NO GROWTH 5 DAYS     Performed at [facility]   Report Status 03/14/2013 FINAL   Final  URINE CULTURE     Status: None   Collection Time    03/10/13 11:13 AM      Result Value Ref Range Status   Specimen Description URINE, CATHETERIZED   Final   Special Requests NONE   Final   Culture  Setup Time     Final   Value: 03/10/2013 17:12     Performed at [place]     Final   Value: 75,000 COLONIES/ML     Performed at [facility]   Culture     Final   Value: STAPHYLOCOCCUS SPECIES (COAGULASE NEGATIVE)     Note: RIFAMPIN AND GENTAMICIN SHOULD NOT BE USED AS SINGLE DRUGS FOR TREATMENT OF STAPH INFECTIONS.     Performed at [facility]   Report Status 03/12/2013 FINAL   Final   Organism ID, Bacteria STAPHYLOCOCCUS SPECIES (COAGULASE NEGATIVE)   Final  CULTURE, RESPIRATORY (NON-EXPECTORATED)     Status: None   Collection Time    03/10/13 11:30 AM      Result Value Ref Range Status   Specimen Description TRACHEAL ASPIRATE   Final   Special Requests NONE   Final   Gram Stain     Final   Value: ABUNDANT WBC PRESENT, PREDOMINANTLY PMN     RARE SQUAMOUS EPITHELIAL CELLS PRESENT     ABUNDANT GRAM VARIABLE ROD     FEW GRAM POSITIVE COCCI     IN CLUSTERS   Culture     Final   Value: MODERATE KLEBSIELLA OXYTOCA     MODERATE KLEBSIELLA PNEUMONIAE     Performed at [facility]   Report Status 03/13/2013 FINAL   Final   Organism ID, Bacteria KLEBSIELLA OXYTOCA   Final    CULTURE, BLOOD (ROUTINE X 2)     Status: None   Collection Time    03/10/13 11:42 AM      Result Value Ref Range Status   Specimen Description BLOOD LEFT ANTECUBITAL   Final   Special Requests BOTTLES DRAWN AEROBIC AND ANAEROBIC 10CC   Final   Culture  Setup Time     Final   Value: 03/10/2013 16:16     Performed at [facility]   Culture     Final   Value:        BLOOD CULTURE RECEIVED NO GROWTH TO DATE CULTURE WILL BE HELD FOR 5 DAYS BEFORE ISSUING A FINAL NEGATIVE REPORT     Performed at [facility]   Report Status PENDING   Incomplete  CULTURE, BLOOD (ROUTINE X 2)     Status: None   Collection Time    03/10/13 11:50 AM      Result Value Ref Range Status   Specimen Description BLOOD LEFT HAND   Final   Special Requests BOTTLES DRAWN AEROBIC ONLY 10CC   Final   Culture  Setup Time  Final   Value: 03/10/2013 16:16     Performed at [facility]   Culture     Final   Value:        BLOOD CULTURE RECEIVED NO GROWTH TO DATE CULTURE WILL BE HELD FOR 5 DAYS BEFORE ISSUING A FINAL NEGATIVE REPORT     Performed at [facility]   Report Status PENDING   Incomplete     Studies:  Recent x-ray studies have been reviewed in detail by the Attending Physician  Scheduled Meds:  Scheduled Meds: . amLODipine  10 mg Per Tube Daily  . ampicillin-sulbactam (UNASYN) IV  3 g Intravenous Q6H  . antiseptic oral rinse  15 mL Mouth Rinse BID  . aspirin  324 mg Per Tube Daily  . heparin subcutaneous  5,000 Units Subcutaneous 3 times per day  . insulin aspart  0-9 Units Subcutaneous 6 times per day  . lisinopril  20 mg Oral BID  . metoprolol tartrate  50 mg Oral BID    Time spent on care of this patient: 35 mins   [place]  [phone] Pager - Text Page per Shea Evans as per below:  On-Call/Text Page:      [email or web address]      password [email or web address]  If 7PM-7AM, please contact night-coverage [email or web address] Password [email or web address] 03/15/2013,  11:06 AM   LOS: 8 days

## 2013-03-15 NOTE — Progress Notes (Signed)
Speech Language Pathology Treatment: Dysphagia  Patient Details Name: Eric [name] MRN: [id number] DOB: [birth year] Today's Date: 03/15/2013 Time: [id number] SLP Time Calculation (min): 30 min  Assessment / Plan / Recommendation Clinical Impression  F/u to assess PO readiness following BSE.  Vocal quality hoarse but noted improvement from prior date. Volitional throat clear and cough weak and ineffective.    Marked delay in initiation with thin water trials by spoon and cup with change in respirations s/p each trial.  Noted decreased coordination in swallow sequence with liquid trials.  Tolerated trials of puree consistency in small amounts but noted change in vital signs with swallows in succession.  Improvement in swallow ability but continues to exhibit decreased ability to protect airway fully with PO's.  Continue NPO status with exception of medication crushed in puree and ice chips PRN s/p oral care.  ST to f/u on 03/16/13 to determine PO readiness.     HPI HPI: 58 yo with h/o CAD s/p CABG, DM, HTN admitted following witnessed sz at [employer]. Apneic and pulseless at scene. CPR x 5 minutes, king airway placed by EMS, intubated in ED. 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 Head CT showed hydrocephalus with diffuse dilatation fo the lateral and third ventricles. Intubated 2/13-2/18. Per MD notes unclear if this is acute hydrocephalus with increased ICP ( however, CT brain without evidence of mass effect) causing seizures and then a cardiac event, or perhaps a primary cardiac issue leading to seizures with an incidental finding of chronic obstructive hydrocephalus.   Pertinent Vitals Oxygen Saturation 91% RR 22 to 32  SLP Plan  Continue with current plan of care    Recommendations Diet recommendations: NPO;Other(comment) (ice chips PRN s/p oral care ) Medication Administration: Crushed with puree              General recommendations: Rehab consult Oral Care  Recommendations: Oral care Q4 per protocol Follow up Recommendations: Inpatient Rehab Plan: Continue with current plan of care    Margate Crocker, [facility] 03/15/2013, 11:57 AM

## 2013-03-15 NOTE — Progress Notes (Signed)
Pt found sitting on the floor in his room. No signs/symptoms of injury. VSS HR=89, SPO2=91, BP=171/98. Patient does not seem phased by fall. RN, Agricultural consultant, NT present for post fall huddle at bedside. Patient helped back to bed with no apparent problem. Dr. Thereasa Solo notified of fall at 1324 03/15/2013. Safety measures taken include=patient in close proximity to nurses station, fall risk socks on. At last observation before fall, pts family was present and attentive. Previous interactions with family included notifications when they were to leave patients room. No family present when patient found. Patient currently comfortable and pain free. Will continue to monitor.

## 2013-03-15 NOTE — Progress Notes (Signed)
Subjective:  Patient denies any chest pain or shortness of breath more awake and alert  Objective:  Vital Signs in the last 24 hours: Temp:  [97.7 F (36.5 C)-98.9 F (37.2 C)] 98.9 F (37.2 C) (02/21 0710) Pulse Rate:  [80-100] 100 (02/21 0710) Resp:  [21-33] 31 (02/21 0710) BP: (136-184)/(75-102) 181/91 mmHg (02/21 0710) SpO2:  [90 %-96 %] 90 % (02/21 0710) Weight:  [75.2 kg (165 lb 12.6 oz)] 75.2 kg (165 lb 12.6 oz) (02/21 0338)  Intake/Output from previous day: 02/20 0701 - 02/21 0700 In: 867.7 [I.V.:461.7; IV Piggyback:400] Out: 1600 [Urine:1600] Intake/Output from this shift: Total I/O In: 80 [I.V.:80] Out: 400 [Urine:400]  Physical Exam: Neck: no adenopathy, no carotid bruit, no JVD and supple, symmetrical, trachea midline Lungs: Decreased breath sound at bases with occasional rhonchi Heart: regular rate and rhythm, S1, S2 normal and Soft systolic murmur noted no S3 gallop Abdomen: soft, non-tender; bowel sounds normal; no masses,  no organomegaly Extremities: extremities normal, atraumatic, no cyanosis or edema  Lab Results:  Recent Labs  03/14/13 0325 03/15/13 0405  WBC 7.8 8.9  HGB 9.2* 9.2*  PLT 197 206    Recent Labs  03/14/13 0325 03/15/13 0405  NA 148* 152*  K 4.1 4.0  CL 111 113*  CO2 23 25  GLUCOSE 155* 174*  BUN 24* 25*  CREATININE 0.98 0.88   No results found for this basename: TROPONINI, CK, MB,  in the last 72 hours Hepatic Function Panel No results found for this basename: PROT, ALBUMIN, AST, ALT, ALKPHOS, BILITOT, BILIDIR, IBILI,  in the last 72 hours No results found for this basename: CHOL,  in the last 72 hours No results found for this basename: PROTIME,  in the last 72 hours  Imaging: Imaging results have been reviewed and Mr Jeri Cos Wo Contrast  03/13/2013   CLINICAL DATA:  Recent cardiac arrest. Seizure and altered mental status. Ventriculomegaly on CT.  EXAM: MRI HEAD WITHOUT AND WITH CONTRAST  TECHNIQUE: Multiplanar,  multiecho pulse sequences of the brain and surrounding structures were obtained without and with intravenous contrast.  CONTRAST:  86mL MULTIHANCE GADOBENATE DIMEGLUMINE 529 MG/ML IV SOLN  COMPARISON:  Head CT 03/07/2013  FINDINGS: Images are moderately degraded by motion artifact. The genu and anterior body of the corpus callosum are present, however the posterior body and splenium are not clearly identified. There is dilatation of the lateral and third ventricles, with marked dilatation of the atria of the lateral ventricles. The fourth ventricle is nondilated. The cerebral aqueduct appears grossly patent without tectal region mass seen. Periventricular T2 hyperintensities suggestive of transependymal CSF flow is identified.  There is no evidence of acute infarct. There is no evidence of mass, midline shift, or extra-axial fluid collection. Small foci of T2 hyperintensity within the cerebral white matter are nonspecific but compatible with mild chronic small vessel ischemic disease. Small foci of susceptibility artifact are seen in the anterior right frontal lobe and at the margin of the right frontal horn, compatible with remote hemorrhage. There is also evidence of a small amount of remote hemorrhage in the region of the right basal ganglia. No gross abnormal enhancement is identified.  Major intracranial vascular flow voids are grossly preserved. Orbits are unremarkable. Paranasal sinuses and mastoid air cells are clear.  IMPRESSION: 1. Moderate motion artifact. No evidence of acute intracranial abnormality. 2. Ventricular dilatation, which is felt to be congenital/developmental rather than representing acute hydrocephalus, with corpus callosum dysgenesis. These results were called by telephone  at the time of interpretation on 03/13/2013 at 7:20 PM to Dr. Leonel Ramsay, who verbally acknowledged these results.   Electronically Signed   By: Logan Bores   On: 03/13/2013 19:46   Dg Chest Port 1 View  03/14/2013    CLINICAL DATA:  Central line placement.  EXAM: PORTABLE CHEST - 1 VIEW  COMPARISON:  03/13/2013  FINDINGS: Prior CABG. Left central line is in place with the tip at the cavoatrial junction. No pneumothorax.  Cardiomegaly with vascular congestion and bilateral airspace opacities, likely edema. No real change.  IMPRESSION: Left central line tip at the cavoatrial junction.  No pneumothorax.  Bilateral airspace disease, likely edema, unchanged.   Electronically Signed   By: Rolm Baptise M.D.   On: 03/14/2013 05:09   Dg Chest Port 1 View  03/13/2013   CLINICAL DATA:  Status post nasogastric tube placement  EXAM: PORTABLE CHEST - 1 VIEW  COMPARISON:  DG CHEST 1V PORT dated 03/13/2013  FINDINGS: The nasogastric tube tip and proximal port lie well below the GE junction. The tube is coiled upon itself within the gastric fundus. The lungs are borderline hypoinflated. The interstitial markings remain increased bilaterally. The cardiac silhouette remains enlarged and the pulmonary vascularity remains engorged. The left internal jugular venous catheter tip lies in the region of the proximal to mid SVC.  IMPRESSION: Interval placement of a nasogastric tube reveals reasonable positioning below the level of the GE junction.   Electronically Signed   By: David  Martinique   On: 03/13/2013 15:33    Cardiac Studies:  Assessment/Plan:  Status post recurrent V. fib cardiac arrest  Status post Vent dependent respiratory failure secondary to above rule out aspiration  Coronary artery disease status post CABG STATUS post left cath small vessel disease  Uncontrolled Hypertension  Diabetes mellitus  Hypercholesteremia  Status post seizures  Plan Reduce aspirin to 81 mg daily Increase Lopressor 50 mg 3 times daily Add low-dose Aldactone and statins  LOS: 8 days    Cristina Ceniceros N 03/15/2013, 11:48 AM

## 2013-03-15 NOTE — Plan of Care (Signed)
Called by RN for increasing O2 requirements.  Has been markedly hypertensive, fluid balance is about even for the admission, CXR with worsening pulmonary edema.  Plan: Recheck BP now and administer prn hydralazine if SBP > 160 (already received prn labetalol at 20:00) Lasix 40 mg IV (already ordered by PCCM) Transduce a CVP to assess, though this may be driven by HTN and diastolic dysfunction. Hypernatremia noted, may need to discontinue D5 (assume this is ordered as NGT was pulled and has failed swallow eval).  Eric [name] 9:54 PM

## 2013-03-15 NOTE — Progress Notes (Addendum)
[place] Progress Note Patient Name: Eric [name] DOB: 10/31/1955 MRN: [id number]  Date of Service  03/15/2013   HPI/Events of Note  Patient with increasing oxygen requirement.  CXR with worsening edema.  Patient hypertensive.  ? diastolic heart failure.   eICU Interventions  Lasix now  Intervention Category Major Interventions: Hypoxemia - evaluation and management  Mauri Brooklyn, P 03/15/2013, 9:47 PM

## 2013-03-16 ENCOUNTER — Inpatient Hospital Stay (HOSPITAL_COMMUNITY): Payer: Medicaid Other

## 2013-03-16 LAB — BASIC METABOLIC PANEL
BUN: 28 mg/dL — ABNORMAL HIGH (ref 6–23)
CALCIUM: 8.8 mg/dL (ref 8.4–10.5)
CO2: 28 meq/L (ref 19–32)
Chloride: 110 mEq/L (ref 96–112)
Creatinine, Ser: 0.96 mg/dL (ref 0.50–1.35)
GFR calc Af Amer: >90 mL/min (ref >90)
GFR calc non Af Amer: 89 mL/min — ABNORMAL LOW (ref >90)
GLUCOSE: 172 mg/dL — AB (ref 70–99)
Potassium: 3.6 mEq/L — ABNORMAL LOW (ref 3.7–5.3)
SODIUM: 149 meq/L — AB (ref 137–147)

## 2013-03-16 LAB — CULTURE, BLOOD (ROUTINE X 2)
CULTURE: NO GROWTH
CULTURE: NO GROWTH

## 2013-03-16 LAB — CBC
HCT: 28.3 % — ABNORMAL LOW (ref 39.0–52.0)
Hemoglobin: 9.5 g/dL — ABNORMAL LOW (ref 13.0–17.0)
MCH: 31.6 pg (ref 26.0–34.0)
MCHC: 33.6 g/dL (ref 30.0–36.0)
MCV: 94 fL (ref 78.0–100.0)
PLATELETS: 236 10*3/uL (ref 150–400)
RBC: 3.01 MIL/uL — AB (ref 4.22–5.81)
RDW: 13.5 % (ref 11.5–15.5)
WBC: 12.8 10*3/uL — ABNORMAL HIGH (ref 4.0–10.5)

## 2013-03-16 LAB — GLUCOSE, CAPILLARY
Glucose-Capillary: 157 mg/dL — ABNORMAL HIGH (ref 70–99)
Glucose-Capillary: 168 mg/dL — ABNORMAL HIGH (ref 70–99)
Glucose-Capillary: 169 mg/dL — ABNORMAL HIGH (ref 70–99)
Glucose-Capillary: 174 mg/dL — ABNORMAL HIGH (ref 70–99)
Glucose-Capillary: 207 mg/dL — ABNORMAL HIGH (ref 70–99)

## 2013-03-16 MED ORDER — HALOPERIDOL LACTATE 5 MG/ML IJ SOLN
INTRAMUSCULAR | Status: AC
  Filled 2013-03-16: qty 1

## 2013-03-16 MED ORDER — FUROSEMIDE 10 MG/ML IJ SOLN
80.0000 mg | Freq: Once | INTRAMUSCULAR | Status: AC
  Administered 2013-03-16: 80 mg via INTRAVENOUS
  Filled 2013-03-16: qty 8

## 2013-03-16 MED ORDER — HALOPERIDOL LACTATE 5 MG/ML IJ SOLN
5.0000 mg | INTRAMUSCULAR | Status: DC | PRN
  Administered 2013-03-16 – 2013-03-18 (×2): 5 mg via INTRAVENOUS
  Filled 2013-03-16: qty 1

## 2013-03-16 MED ORDER — QUETIAPINE 12.5 MG HALF TABLET
12.5000 mg | ORAL_TABLET | Freq: Every day | ORAL | Status: DC
  Administered 2013-03-16 – 2013-03-24 (×9): 12.5 mg via ORAL
  Filled 2013-03-16 (×10): qty 1

## 2013-03-16 MED ORDER — NITROGLYCERIN IN D5W 200-5 MCG/ML-% IV SOLN
2.0000 ug/min | INTRAVENOUS | Status: DC
  Administered 2013-03-16: 40 ug/min via INTRAVENOUS
  Administered 2013-03-16: 5 ug/min via INTRAVENOUS
  Filled 2013-03-16 (×2): qty 250

## 2013-03-16 MED ORDER — AMLODIPINE BESYLATE 10 MG PO TABS
10.0000 mg | ORAL_TABLET | Freq: Every day | ORAL | Status: DC
  Administered 2013-03-17 – 2013-03-25 (×9): 10 mg via ORAL
  Filled 2013-03-16 (×9): qty 1

## 2013-03-16 MED ORDER — INSULIN ASPART 100 UNIT/ML ~~LOC~~ SOLN
0.0000 [IU] | Freq: Four times a day (QID) | SUBCUTANEOUS | Status: DC
  Administered 2013-03-16: 3 [IU] via SUBCUTANEOUS
  Administered 2013-03-16: 2 [IU] via SUBCUTANEOUS
  Administered 2013-03-17 (×2): 1 [IU] via SUBCUTANEOUS
  Administered 2013-03-17: 2 [IU] via SUBCUTANEOUS

## 2013-03-16 NOTE — Progress Notes (Signed)
ABG wasn't obtained due to fellow canceling order.

## 2013-03-16 NOTE — Plan of Care (Addendum)
Called and spoke with RN regarding status of Eric [name].  Still SOB, CVP ranging 16-18+, and has about 500 cc of UOP since last dose of lasix and remains hypertensive.  Likely HFpEF, however, differential includes aspiration PNA from primary event, pulmonary contusion from CPR, amiodarone toxicity, PE unlikely in setting of CXR abnormalities.  Plan: IV lasix 80 mg x 1 IV NTG drip titratable for goal SBP 140-150 mmHg BiPAP prn WOB  Melinda Pottinger 03/16/13 2:12 AM

## 2013-03-16 NOTE — Progress Notes (Signed)
[place] TEAM 1 - Stepdown/ICU TEAM Progress Note  Eric [name] [id number] DOB: 14-Apr-1955 DOA: 03/07/2013 PCP: No primary provider on file.  Admit HPI / Brief Narrative: 58 yo with witnessed convulstions at a [employer]. Apneic and pulseless at scene. CPR x 5 minutes , king airway placed by EMS, intubated in ED. Receives his care in [place]. 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   SIGNIFICANT EVENTS / STUDIES:  2/13 CT head: Hydrocephalus, with diffuse dilatation of the lateral and third ventricles.  2/13 Cards consult Terrence Dupont) and LHC: Cardiac cath with severe native vessel coronary artery disease but patent SVGs and LIMA  2/13 Neuro Consult 2/14 EEG: discontinuous slow background - no epileptiform activity noted 2/14 Amiodarone discontinued due to sinus bradycardia  03/09/13: Rewarming started - Episode of VT  ETT 2/13 >>2/18  HPI/Subjective: Pt is sedate this morning.  He had a rough night with agitation leading to acute peak in HTN and noncompliance w/ O2 and then pulmonary edema w/ hypoxia.    Assessment/Plan:  Recurrent VF arrest likely the primary event leading to his collapse prior to presentation - Cardiology following - EP planning ICD insertion over the next few days - EF on TTE 2/20 is normal at 55-60% w/ no WMA  VDRF post arrest Extubated 2/18 - no evidence of primary pulmonary issues   Sinus bradycardia on amiodarone  Resolved w/ d/c of amio   Recurrent VT off amiodarone As per Cardiology  Encephalopathy  secondary to his anoxic event possibly coupled with ICU delirium - appears to worsen each night c/w sundowning - add scheduled seroquel and follow   Uncontrolled Hypertension  Acute event last night likely related to agitation - currently well controlled on nitro gtt - will attemept to wean off nitro and follow trend   Hypoxia Appears to have been due to 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 due to severe agitation -  elevated CVP noted, but I questions its accuracy as pt is not overloaded on exam and is actually hypernatremic - wean O2 - control BP - control agitation   Hypernatremia  Clinically appears to be due to actual DH/free water deficit - suspect pulm edema was due to malignant HTN and not true volume overload - follow w/o IVF today, but will soon need to resume free water or place NG   Anemia of critical illness  Keep Hgb 8.0 or > - stable at present  Mild thrombocytopenia Resolved  Klebsiella in sputum Remains on unasyn - afeb - WBC normal - complete 7 days of tx   DM2 Reasonably controlled at present - follow trend   Hydrocephalus Appears congenital on MRI - no further interventions at this time  Seizures Not clear if seizure > arrest vs arrest > convulsive syncope or seizure - significant doubt that this represents seizure as a primary event - not on AED - EEG w/o epileptiform activity   Nutrition  To have NG placed to resume tube feeds if unable to pass SLP eval soon but will not place today due to severe agitation last night (high risk of worsening agitation or pulling tube > aspiration)  Code Status: FULL Family Communication: spoke w/ daughter at bedside  Disposition Plan: SDU  Consultants: PCCM Cardiology Neurology  EP  Antibiotics: Vancomycin 2/17>>>2/19  Zosyn 2/17>>>2/19  Unasyn 2/19>>>  DVT prophylaxis: SQ heparin   Objective: Blood pressure 135/72, pulse 92, temperature 97.4 F (36.3 C), temperature source Oral, resp. rate 23, height 6' (1.829  m), weight 74.2 kg (163 lb 9.3 oz), SpO2 100.00%.  Intake/Output Summary (Last 24 hours) at 03/16/13 0941 Last data filed at 03/16/13 0700  Gross per 24 hour  Intake 1180.98 ml  Output   1500 ml  Net -319.02 ml   Exam: General: No acute respiratory distress at rest - NRB but sats 100% Lungs: CTA th/o w/o wheezes  Cardiovascular: Regular rate and rhythm without murmur gallop or rub  Abdomen: Nontender,  nondistended, soft, bowel sounds positive, no rebound, no ascites, no appreciable mass Extremities: No significant cyanosis, clubbing;  No edema bilateral lower extremities  Data Reviewed: Basic Metabolic Panel:  Recent Labs Lab 03/11/13 0344 03/12/13 0410 03/13/13 0345 03/14/13 0325 03/15/13 0405 03/16/13 0405  NA 142 141 144 148* 152* 149*  K 4.2 3.9 3.8 4.1 4.0 3.6*  CL 107 108 105 111 113* 110  CO2 20 20 23 23 25 28   GLUCOSE 160* 195* 138* 155* 174* 172*  BUN 27* 28* 25* 24* 25* 28*  CREATININE 1.56* 1.05 1.08 0.98 0.88 0.96  CALCIUM 8.2* 8.1* 8.2* 8.7 8.9 8.8  MG 2.2 2.4 2.1 2.2 2.5  --   PHOS 4.1 2.4 3.1 3.5 3.8  --    Liver Function Tests: No results found for this basename: AST, ALT, ALKPHOS, BILITOT, PROT, ALBUMIN,  in the last 168 hours  CBC:  Recent Labs Lab 03/11/13 0344 03/12/13 0410 03/13/13 0345 03/14/13 0325 03/15/13 0405 03/16/13 0405  WBC 6.9 5.4 6.7 7.8 8.9 12.8*  NEUTROABS 5.6 4.1 4.6 5.5  --   --   HGB 10.2* 9.1* 9.0* 9.2* 9.2* 9.5*  HCT 29.5* 26.2* 27.3* 27.3* 28.1* 28.3*  MCV 91.0 90.7 91.6 91.6 93.4 94.0  PLT 143* 130* 161 197 206 236   Cardiac Enzymes:  Recent Labs Lab 03/10/13 1130 03/11/13 0340  CKTOTAL 364*  --   CKMB 1.9  --   TROPONINI  --  <0.30   BNP (last 3 results)  Recent Labs  03/11/13 0340  PROBNP 370.5*   CBG:  Recent Labs Lab 03/15/13 1615 03/15/13 1940 03/15/13 2341 03/16/13 0400 03/16/13 0804  GLUCAP 192* 180* 174* 157* 169*    Recent Results (from the past 240 hour(s))  URINE CULTURE     Status: None   Collection Time    03/07/13 12:19 PM      Result Value Ref Range Status   Specimen Description URINE, CATHETERIZED   Final   Special Requests NONE   Final   Culture  Setup Time     Final   Value: 03/07/2013 13:23     Performed at [facility]     Final   Value: NO GROWTH     Performed at [facility]   Culture     Final   Value: NO GROWTH     Performed at  [facility]   Report Status 03/08/2013 FINAL   Final  MRSA PCR SCREENING     Status: None   Collection Time    03/07/13  2:35 PM      Result Value Ref Range Status   MRSA by PCR NEGATIVE  NEGATIVE Final   Comment:            The GeneXpert MRSA Assay (FDA     approved for NASAL specimens     only), is one component of a     comprehensive MRSA colonization     surveillance program. It is not  intended to diagnose MRSA     infection nor to guide or     monitor treatment for     MRSA infections.  CULTURE, BLOOD (ROUTINE X 2)     Status: None   Collection Time    03/07/13  7:42 PM      Result Value Ref Range Status   Specimen Description BLOOD LEFT HAND   Final   Special Requests BOTTLES DRAWN AEROBIC ONLY 1CC   Final   Culture  Setup Time     Final   Value: 03/08/2013 00:40     Performed at [facility]   Culture     Final   Value: NO GROWTH 5 DAYS     Performed at [facility]   Report Status 03/14/2013 FINAL   Final  CULTURE, BLOOD (ROUTINE X 2)     Status: None   Collection Time    03/07/13  7:53 PM      Result Value Ref Range Status   Specimen Description BLOOD RIGHT HAND   Final   Special Requests BOTTLES DRAWN AEROBIC ONLY 1CC   Final   Culture  Setup Time     Final   Value: 03/08/2013 00:40     Performed at [facility]   Culture     Final   Value: NO GROWTH 5 DAYS     Performed at [facility]   Report Status 03/14/2013 FINAL   Final  URINE CULTURE     Status: None   Collection Time    03/10/13 11:13 AM      Result Value Ref Range Status   Specimen Description URINE, CATHETERIZED   Final   Special Requests NONE   Final   Culture  Setup Time     Final   Value: 03/10/2013 17:12     Performed at [facility]     Final   Value: 75,000 COLONIES/ML     Performed at [facility]   Culture     Final   Value: STAPHYLOCOCCUS SPECIES (COAGULASE NEGATIVE)     Note: RIFAMPIN AND GENTAMICIN  SHOULD NOT BE USED AS SINGLE DRUGS FOR TREATMENT OF STAPH INFECTIONS.     Performed at [facility]   Report Status 03/12/2013 FINAL   Final   Organism ID, Bacteria STAPHYLOCOCCUS SPECIES (COAGULASE NEGATIVE)   Final  CULTURE, RESPIRATORY (NON-EXPECTORATED)     Status: None   Collection Time    03/10/13 11:30 AM      Result Value Ref Range Status   Specimen Description TRACHEAL ASPIRATE   Final   Special Requests NONE   Final   Gram Stain     Final   Value: ABUNDANT WBC PRESENT, PREDOMINANTLY PMN     RARE SQUAMOUS EPITHELIAL CELLS PRESENT     ABUNDANT GRAM VARIABLE ROD     FEW GRAM POSITIVE COCCI     IN CLUSTERS   Culture     Final   Value: MODERATE KLEBSIELLA OXYTOCA     MODERATE KLEBSIELLA PNEUMONIAE     Performed at [facility]   Report Status 03/13/2013 FINAL   Final   Organism ID, Bacteria KLEBSIELLA OXYTOCA   Final  CULTURE, BLOOD (ROUTINE X 2)     Status: None   Collection Time    03/10/13 11:42 AM      Result Value Ref Range Status   Specimen Description BLOOD LEFT ANTECUBITAL   Final  Special Requests BOTTLES DRAWN AEROBIC AND ANAEROBIC 10CC   Final   Culture  Setup Time     Final   Value: 03/10/2013 16:16     Performed at [facility]   Culture     Final   Value:        BLOOD CULTURE RECEIVED NO GROWTH TO DATE CULTURE WILL BE HELD FOR 5 DAYS BEFORE ISSUING A FINAL NEGATIVE REPORT     Performed at [facility]   Report Status PENDING   Incomplete  CULTURE, BLOOD (ROUTINE X 2)     Status: None   Collection Time    03/10/13 11:50 AM      Result Value Ref Range Status   Specimen Description BLOOD LEFT HAND   Final   Special Requests BOTTLES DRAWN AEROBIC ONLY 10CC   Final   Culture  Setup Time     Final   Value: 03/10/2013 16:16     Performed at [facility]   Culture     Final   Value:        BLOOD CULTURE RECEIVED NO GROWTH TO DATE CULTURE WILL BE HELD FOR 5 DAYS BEFORE ISSUING A FINAL NEGATIVE REPORT     Performed at  [facility]   Report Status PENDING   Incomplete     Studies:  Recent x-ray studies have been reviewed in detail by the Attending Physician  Scheduled Meds:  Scheduled Meds: . amLODipine  10 mg Per Tube Daily  . ampicillin-sulbactam (UNASYN) IV  3 g Intravenous Q6H  . antiseptic oral rinse  15 mL Mouth Rinse BID  . aspirin  81 mg Oral Daily  . atorvastatin  40 mg Oral q1800  . heparin subcutaneous  5,000 Units Subcutaneous 3 times per day  . insulin aspart  0-9 Units Subcutaneous 6 times per day  . lisinopril  20 mg Oral BID  . metoprolol tartrate  50 mg Oral TID  . spironolactone  12.5 mg Oral Daily    Time spent on care of this patient: 35 mins   [place]  [phone] Pager - Text Page per Shea Evans as per below:  On-Call/Text Page:      [email or web address]      password [email or web address]  If 7PM-7AM, please contact night-coverage [email or web address] Password [facility] 03/16/2013, 9:41 AM   LOS: 9 days

## 2013-03-16 NOTE — Progress Notes (Signed)
Patient ID: Eric [name], male   DOB: 02/18/1955, 58 y.o.   MRN: [id number]   Patient Name: Eric [name] Date of Encounter: 03/16/2013     Active Problems:   Cardiac arrest   Seizure   Acute respiratory failure with hypoxia   Ventricular fibrillation    SUBJECTIVE  Somnolent, sedated, but follows commands.  CURRENT MEDS . amLODipine  10 mg Per Tube Daily  . ampicillin-sulbactam (UNASYN) IV  3 g Intravenous Q6H  . antiseptic oral rinse  15 mL Mouth Rinse BID  . aspirin  81 mg Oral Daily  . atorvastatin  40 mg Oral q1800  . heparin subcutaneous  5,000 Units Subcutaneous 3 times per day  . insulin aspart  0-9 Units Subcutaneous 6 times per day  . lisinopril  20 mg Oral BID  . metoprolol tartrate  50 mg Oral TID  . spironolactone  12.5 mg Oral Daily    OBJECTIVE  Filed Vitals:   03/16/13 0545 03/16/13 0600 03/16/13 0615 03/16/13 0801  BP: 155/83 160/76 143/70 135/72  Pulse: 94 94 93 92  Temp:    97.4 F (36.3 C)  TempSrc:    Oral  Resp: 18 20 18 23   Height:      Weight:      SpO2: 99% 99% 99% 100%    Intake/Output Summary (Last 24 hours) at 03/16/13 0920 Last data filed at 03/16/13 0700  Gross per 24 hour  Intake 1180.98 ml  Output   1500 ml  Net -319.02 ml   Filed Weights   03/14/13 0419 03/15/13 0338 03/16/13 0500  Weight: 163 lb 12.8 oz (74.3 kg) 165 lb 12.6 oz (75.2 kg) 163 lb 9.3 oz (74.2 kg)    PHYSICAL EXAM  General: Pleasant, NAD. Neuro: somnolent but follows commands. Moves all extremities spontaneously. Psych: Normal affect. HEENT:  Normal  Neck: Supple without bruits or JVD. Lungs:  Resp regular and unlabored, CTA. Heart: RRR no s3, s4, or murmurs. Abdomen: Soft, non-tender, non-distended, BS + x 4.  Extremities: No clubbing, cyanosis or edema. DP/PT/Radials 2+ and equal bilaterally.  Accessory Clinical Findings  CBC  Recent Labs  03/14/13 0325 03/15/13 0405 03/16/13 0405  WBC 7.8 8.9 12.8*  NEUTROABS 5.5  --   --   HGB 9.2*  9.2* 9.5*  HCT 27.3* 28.1* 28.3*  MCV 91.6 93.4 94.0  PLT 197 206 [id number]   Basic Metabolic Panel  Recent Labs  03/14/13 0325 03/15/13 0405 03/16/13 0405  NA 148* 152* 149*  K 4.1 4.0 3.6*  CL 111 113* 110  CO2 23 25 28   GLUCOSE 155* 174* 172*  BUN 24* 25* 28*  CREATININE 0.98 0.88 0.96  CALCIUM 8.7 8.9 8.8  MG 2.2 2.5  --   PHOS 3.5 3.8  --    Liver Function Tests No results found for this basename: AST, ALT, ALKPHOS, BILITOT, PROT, ALBUMIN,  in the last 72 hours No results found for this basename: LIPASE, AMYLASE,  in the last 72 hours Cardiac Enzymes No results found for this basename: CKTOTAL, CKMB, CKMBINDEX, TROPONINI,  in the last 72 hours BNP No components found with this basename: POCBNP,  D-Dimer No results found for this basename: DDIMER,  in the last 72 hours Hemoglobin A1C No results found for this basename: HGBA1C,  in the last 72 hours Fasting Lipid Panel No results found for this basename: CHOL, HDL, LDLCALC, TRIG, CHOLHDL, LDLDIRECT,  in the last 72 hours Thyroid Function Tests No results found for this basename:  TSH, T4TOTAL, FREET3, T3FREE, THYROIDAB,  in the last 72 hours  TELE nsr   Radiology/Studies  Ct Head (brain) Wo Contrast  03/07/2013   CLINICAL DATA:  Weakness seizure, became pulseless and apneic, post CPR, history hypertension, diabetes  EXAM: CT HEAD WITHOUT CONTRAST  CT CERVICAL SPINE WITHOUT CONTRAST  TECHNIQUE: Multidetector CT imaging of the head and cervical spine was performed following the standard protocol without intravenous contrast. Multiplanar CT image reconstructions of the cervical spine were also generated.  COMPARISON:  None  FINDINGS: CT HEAD FINDINGS  Motion artifacts, for which repeat imaging was performed.  Diffuse dilatation of the lateral and third ventricles compatible of hydrocephalus.  Fourth rectal decompressed.  Scattered fall seen calcification.  No midline shift or mass effect.  Otherwise normal appearance of brain  parenchyma.  No intracranial hemorrhage, mass lesion or evidence acute infarction.  No extra-axial fluid collections.  Beam hardening artifacts of dental origin.  Bones and sinuses unremarkable.  CT CERVICAL SPINE FINDINGS  Visualized skullbase intact.  Osseous mineralization normal.  Endotracheal and nasogastric tubes, with nasogastric tube coiled in pharynx.  Question infiltrate right apex.  Vertebral body and disc space heights maintained.  Prevertebral soft tissues normal thickness.  No acute fracture, subluxation or bone destruction.  IMPRESSION: Hydrocephalus, with diffuse dilatation of the lateral and third ventricles.  Lack of fourth ventricular dilatation raises question of aquaductal stenosis; consider followup MR imaging of the brain with and without contrast to assess.  No other intracranial abnormalities.  No acute cervical spine abnormalities.  Nasogastric tube coiled in pharynx - this finding was called to [facility] RN 2-Heart at 1418 hr on 03/07/2013.  Question right upper lobe infiltrate.   Electronically Signed   By: Lavonia Dana M.D.   On: 03/07/2013 14:19   Ct Cervical Spine Wo Contrast  03/07/2013   CLINICAL DATA:  Weakness seizure, became pulseless and apneic, post CPR, history hypertension, diabetes  EXAM: CT HEAD WITHOUT CONTRAST  CT CERVICAL SPINE WITHOUT CONTRAST  TECHNIQUE: Multidetector CT imaging of the head and cervical spine was performed following the standard protocol without intravenous contrast. Multiplanar CT image reconstructions of the cervical spine were also generated.  COMPARISON:  None  FINDINGS: CT HEAD FINDINGS  Motion artifacts, for which repeat imaging was performed.  Diffuse dilatation of the lateral and third ventricles compatible of hydrocephalus.  Fourth rectal decompressed.  Scattered fall seen calcification.  No midline shift or mass effect.  Otherwise normal appearance of brain parenchyma.  No intracranial hemorrhage, mass lesion or evidence acute infarction.  No  extra-axial fluid collections.  Beam hardening artifacts of dental origin.  Bones and sinuses unremarkable.  CT CERVICAL SPINE FINDINGS  Visualized skullbase intact.  Osseous mineralization normal.  Endotracheal and nasogastric tubes, with nasogastric tube coiled in pharynx.  Question infiltrate right apex.  Vertebral body and disc space heights maintained.  Prevertebral soft tissues normal thickness.  No acute fracture, subluxation or bone destruction.  IMPRESSION: Hydrocephalus, with diffuse dilatation of the lateral and third ventricles.  Lack of fourth ventricular dilatation raises question of aquaductal stenosis; consider followup MR imaging of the brain with and without contrast to assess.  No other intracranial abnormalities.  No acute cervical spine abnormalities.  Nasogastric tube coiled in pharynx - this finding was called to [facility] RN 2-Heart at 1418 hr on 03/07/2013.  Question right upper lobe infiltrate.   Electronically Signed   By: Lavonia Dana M.D.   On: 03/07/2013 14:19   Mr Jeri Cos [id number]  Contrast  03/13/2013   CLINICAL DATA:  Recent cardiac arrest. Seizure and altered mental status. Ventriculomegaly on CT.  EXAM: MRI HEAD WITHOUT AND WITH CONTRAST  TECHNIQUE: Multiplanar, multiecho pulse sequences of the brain and surrounding structures were obtained without and with intravenous contrast.  CONTRAST:  49mL MULTIHANCE GADOBENATE DIMEGLUMINE 529 MG/ML IV SOLN  COMPARISON:  Head CT 03/07/2013  FINDINGS: Images are moderately degraded by motion artifact. The genu and anterior body of the corpus callosum are present, however the posterior body and splenium are not clearly identified. There is dilatation of the lateral and third ventricles, with marked dilatation of the atria of the lateral ventricles. The fourth ventricle is nondilated. The cerebral aqueduct appears grossly patent without tectal region mass seen. Periventricular T2 hyperintensities suggestive of transependymal CSF flow is identified.   There is no evidence of acute infarct. There is no evidence of mass, midline shift, or extra-axial fluid collection. Small foci of T2 hyperintensity within the cerebral white matter are nonspecific but compatible with mild chronic small vessel ischemic disease. Small foci of susceptibility artifact are seen in the anterior right frontal lobe and at the margin of the right frontal horn, compatible with remote hemorrhage. There is also evidence of a small amount of remote hemorrhage in the region of the right basal ganglia. No gross abnormal enhancement is identified.  Major intracranial vascular flow voids are grossly preserved. Orbits are unremarkable. Paranasal sinuses and mastoid air cells are clear.  IMPRESSION: 1. Moderate motion artifact. No evidence of acute intracranial abnormality. 2. Ventricular dilatation, which is felt to be congenital/developmental rather than representing acute hydrocephalus, with corpus callosum dysgenesis. These results were called by telephone at the time of interpretation on 03/13/2013 at 7:20 PM to Dr. Leonel Ramsay, who verbally acknowledged these results.   Electronically Signed   By: Logan Bores   On: 03/13/2013 19:46   Dg Chest Port 1 View  03/15/2013   CLINICAL DATA:  Hypoxia.  Follow-up pulmonary edema.  EXAM: PORTABLE CHEST - 1 VIEW  COMPARISON:  DG CHEST 1V PORT dated 03/14/2013; DG CHEST 1V PORT dated 03/13/2013; DG CHEST 1V PORT dated 03/13/2013; DG CHEST 1V PORT dated 03/12/2013  FINDINGS: Prior sternotomy for CABG. Cardiac silhouette moderately enlarged but stable. Interval worsening of interstitial and airspace opacities throughout both lungs. Small bilateral pleural effusions suspected. Left jugular central venous catheter tip projects over the lower SVC, unchanged.  IMPRESSION: Interval worsening of now severe interstitial and airspace pulmonary edema throughout both lungs.   Electronically Signed   By: Evangeline Dakin M.D.   On: 03/15/2013 19:33   Dg Chest Port 1  View  03/14/2013   CLINICAL DATA:  Central line placement.  EXAM: PORTABLE CHEST - 1 VIEW  COMPARISON:  03/13/2013  FINDINGS: Prior CABG. Left central line is in place with the tip at the cavoatrial junction. No pneumothorax.  Cardiomegaly with vascular congestion and bilateral airspace opacities, likely edema. No real change.  IMPRESSION: Left central line tip at the cavoatrial junction.  No pneumothorax.  Bilateral airspace disease, likely edema, unchanged.   Electronically Signed   By: Rolm Baptise M.D.   On: 03/14/2013 05:09   Dg Chest Port 1 View  03/13/2013   CLINICAL DATA:  Status post nasogastric tube placement  EXAM: PORTABLE CHEST - 1 VIEW  COMPARISON:  DG CHEST 1V PORT dated 03/13/2013  FINDINGS: The nasogastric tube tip and proximal port lie well below the GE junction. The tube is coiled upon itself within the gastric  fundus. The lungs are borderline hypoinflated. The interstitial markings remain increased bilaterally. The cardiac silhouette remains enlarged and the pulmonary vascularity remains engorged. The left internal jugular venous catheter tip lies in the region of the proximal to mid SVC.  IMPRESSION: Interval placement of a nasogastric tube reveals reasonable positioning below the level of the GE junction.   Electronically Signed   By: David  Martinique   On: 03/13/2013 15:33   Dg Chest Port 1 View  03/13/2013   CLINICAL DATA:  Endotracheal tube position, shortness of breath.  EXAM: PORTABLE CHEST - 1 VIEW  COMPARISON:  DG CHEST 1V PORT dated 03/12/2013  FINDINGS: Interval extubation. Interval removal of nasogastric tube. Left internal jugular central venous catheter with distal tip projecting in mid superior vena cava remains.  Stable mild moderate cardiomegaly, status post median sternotomy. Central pulmonary vasculature congestion interstitial prominence, slightly interstitial prominence. No pleural effusions. No pneumothorax.  Multiple EKG lines overlie the patient and may obscure subtle  underlying pathology. Soft tissue planes and included osseous structures are unremarkable.  IMPRESSION: Interval extubation removal of nasogastric tube. No apparent change in position of left IJ line.  Stable cardiomegaly and worsening interstitial edema.   Electronically Signed   By: Elon Alas   On: 03/13/2013 05:58   Dg Chest Port 1 View  03/12/2013   CLINICAL DATA:  Assess endotracheal tube  EXAM: PORTABLE CHEST - 1 VIEW  COMPARISON:  DG CHEST 1V PORT dated 03/11/2013  FINDINGS: Endotracheal tube tip projects 4.3 cm above the carina. Left internal jugular central venous catheter with distal tip projecting in proximal superior vena cava. Nasogastric tube past the gastroesophageal junction, distal tip projecting pack at gastric fundus. Multiple EKG lines overlie the patient and may obscure subtle underlying pathology.  Cardiac silhouette appears mildly enlarged, status post median sternotomy. Similar interstitial prominence in central pulmonary vasculature engorgement. Mild patchy perihilar airspace opacities without pleural effusions. No pneumothorax.  Soft tissue planes and included osseous structures are unremarkable.  IMPRESSION: No apparent change in position of life support lines.  Mild cardiomegaly and interstitial prominence with perihilar airspace opacities concerning for pulmonary edema.   Electronically Signed   By: Elon Alas   On: 03/12/2013 04:57   Dg Chest Port 1 View  03/11/2013   CLINICAL DATA:  Check ETT position.  EXAM: PORTABLE CHEST - 1 VIEW  COMPARISON:  03/10/2013  FINDINGS: Prior CABG. Support devices are stable. Slight increasing perihilar opacities and interstitial prominence, possibly interstitial edema. Heart is borderline in size. No effusions. No acute bony abnormality.  IMPRESSION: Increasing interstitial prominence and perihilar opacities concerning for mild edema.   Electronically Signed   By: Rolm Baptise M.D.   On: 03/11/2013 06:46   Dg Chest Port 1  View  03/10/2013   CLINICAL DATA:  Respiratory failure  EXAM: PORTABLE CHEST - 1 VIEW  COMPARISON:  Prior radiograph from 03/09/2013  FINDINGS: Endotracheal tube terminates well above the carina. Enteric tube courses into the abdomen with tip located in the gastric fundus. Left IJ central venous catheter tip overlies the distal SVC. Cardiac and mediastinal silhouettes unchanged. Median sternotomy wires remain intact.  The lungs are normally inflated. There has been interval improvement in previously seen confluent opacity within the right perihilar region. No overt pulmonary edema. No new focal infiltrate or pneumothorax. No pleural effusion.  IMPRESSION: 1. Lines and tubes in satisfactory position. 2. Interval improvement in the hazy opacity overlying the right mid lung. No new focal infiltrate or overt  pulmonary edema identified.   Electronically Signed   By: Jeannine Boga M.D.   On: 03/10/2013 06:09   Dg Chest Port 1 View  03/09/2013   CLINICAL DATA:  Respiratory failure.  EXAM: PORTABLE CHEST - 1 VIEW  COMPARISON:  03/07/2013  FINDINGS: Endotracheal tube has tip approximately 3.5 cm above the carina. Enteric tube courses into the region of the stomach and off the inferior portion of the film. Left IJ central venous catheter is unchanged with tip over the SVC. Lungs are adequately inflated with slight worsening hazy opacification over the central right lung. No evidence of effusion or pneumothorax. Cardiomediastinal silhouette and remainder the exam is unchanged.  IMPRESSION: Slight worsening hazy opacification over the central right lung which may be due to asymmetric edema or infection.  Tubes and lines as described.   Electronically Signed   By: Marin Olp M.D.   On: 03/09/2013 08:46   Dg Chest Port 1 View  03/07/2013   CLINICAL DATA:  Cardiac arrest.  Central line placement.  EXAM: PORTABLE CHEST - 1 VIEW  COMPARISON:  03/07/2013  FINDINGS: Endotracheal tube and NG tube are in good position.  Left jugular vein catheter is been inserted the tip is at the cavoatrial junction in good position. No pneumothorax. Left lung remains clear. There is slight haziness in the right perihilar region.  IMPRESSION: 1. Central venous catheter in good position. 2. New slight haziness in the right perihilar region. The possibility of aspiration pneumonitis should be considered.   Electronically Signed   By: Rozetta Nunnery M.D.   On: 03/07/2013 13:12   Dg Chest Portable 1 View  03/07/2013   CLINICAL DATA:  Intubation.  EXAM: PORTABLE CHEST - 1 VIEW  COMPARISON:  None.  FINDINGS: Endotracheal tube tip in satisfactory position projecting approximately 4 cm above the carina. Nasogastric tube tip in the distal esophagus just above the EG junction. Prior sternotomy for CABG. Cardiac silhouette upper normal in size to slightly enlarged for AP portable technique. Linear atelectasis or scar in the lingula. Lungs otherwise clear. No localized airspace consolidation. No pleural effusions. No pneumothorax. Normal pulmonary vascularity.  IMPRESSION: 1. Endotracheal tube tip in satisfactory position projecting approximately 4 cm above the carina. 2. Nasogastric tube tip in the distal esophagus just above the EG junction. This should be advanced several cm. 3. Borderline heart size. Linear atelectasis or scar in the lingula. No acute cardiopulmonary disease otherwise.   Electronically Signed   By: Evangeline Dakin M.D.   On: 03/07/2013 11:59   Dg Abd Portable 1v  03/07/2013   CLINICAL DATA:  Nasogastric tube placement  EXAM: PORTABLE ABDOMEN - 1 VIEW  COMPARISON:  Chest x-ray 03/07/2013  FINDINGS: A nasogastric tube is present. The tip and proximal side hole are noted within the gastric fundus. No evidence of bowel obstruction. Contrast material is noted in the right renal collecting system. Mild cardiomegaly and prior median sternotomy are noted. No free air. A right femoral approach central venous catheter is present. The catheter  tip projects over the L5 vertebral body likely within the common iliac vein.  IMPRESSION: The nasogastric tube is well positioned in the gastric fundus.  The tip of a right femoral approach central venous catheter projects over the L5 pedicle likely in the common iliac vein.   Electronically Signed   By: Jacqulynn Cadet M.D.   On: 03/07/2013 18:44    ASSESSMENT AND PLAN 1.Recurrent VF arrest 2. Anoxic encephalopathy, resolving 3. CAD 4. HTN Rec:  he has improved and no longer on ventilator. Will plan ICD insertion over the next few days, just prior to rehab admit. I discussed plans with the daughter.  Mistee Soliman,M.D.  Libby Goehring,M.D.  03/16/2013 9:20 AM

## 2013-03-16 NOTE — Progress Notes (Signed)
Pt confused, agitated and trying to get out of bed, refuses to wear BIPAP or NRB. Tachypneic and desats to low 80's on 5-6L NCWarren Lacy MD and Dr. Philbert Riser notified. New orders given.

## 2013-03-16 NOTE — Progress Notes (Signed)
Subjective:  Events of last night noted patient more lethargic today. Denies any chest pains or shortness of breath  Objective:  Vital Signs in the last 24 hours: Temp:  [96.8 F (36 C)-98.4 F (36.9 C)] 97.4 F (36.3 C) (02/22 0801) Pulse Rate:  [75-110] 92 (02/22 0801) Resp:  [16-44] 23 (02/22 0801) BP: (135-193)/(68-134) 135/72 mmHg (02/22 0801) SpO2:  [81 %-100 %] 100 % (02/22 0801) FiO2 (%):  [80 %] 80 % (02/22 0240) Weight:  [74.2 kg (163 lb 9.3 oz)] 74.2 kg (163 lb 9.3 oz) (02/22 0500)  Intake/Output from previous day: 02/21 0701 - 02/22 0700 In: 1221 [I.V.:1021; IV Piggyback:200] Out: 1500 [Urine:1500] Intake/Output from this shift:    Physical Exam: Neck: no adenopathy, no carotid bruit, no JVD and supple, symmetrical, trachea midline Lungs: Decreased breath sound at bases with coarse rhonchi Heart: regular rate and rhythm, S1, S2 normal and Soft systolic murmur noted no S3 gallop Abdomen: soft, non-tender; bowel sounds normal; no masses,  no organomegaly Extremities: extremities normal, atraumatic, no cyanosis or edema  Lab Results:  Recent Labs  03/15/13 0405 03/16/13 0405  WBC 8.9 12.8*  HGB 9.2* 9.5*  PLT 206 236    Recent Labs  03/15/13 0405 03/16/13 0405  NA 152* 149*  K 4.0 3.6*  CL 113* 110  CO2 25 28  GLUCOSE 174* 172*  BUN 25* 28*  CREATININE 0.88 0.96   No results found for this basename: TROPONINI, CK, MB,  in the last 72 hours Hepatic Function Panel No results found for this basename: PROT, ALBUMIN, AST, ALT, ALKPHOS, BILITOT, BILIDIR, IBILI,  in the last 72 hours No results found for this basename: CHOL,  in the last 72 hours No results found for this basename: PROTIME,  in the last 72 hours  Imaging: Imaging results have been reviewed and Dg Chest Port 1 View  03/15/2013   CLINICAL DATA:  Hypoxia.  Follow-up pulmonary edema.  EXAM: PORTABLE CHEST - 1 VIEW  COMPARISON:  DG CHEST 1V PORT dated 03/14/2013; DG CHEST 1V PORT dated  03/13/2013; DG CHEST 1V PORT dated 03/13/2013; DG CHEST 1V PORT dated 03/12/2013  FINDINGS: Prior sternotomy for CABG. Cardiac silhouette moderately enlarged but stable. Interval worsening of interstitial and airspace opacities throughout both lungs. Small bilateral pleural effusions suspected. Left jugular central venous catheter tip projects over the lower SVC, unchanged.  IMPRESSION: Interval worsening of now severe interstitial and airspace pulmonary edema throughout both lungs.   Electronically Signed   By: Evangeline Dakin M.D.   On: 03/15/2013 19:33    Cardiac Studies:  Assessment/Plan:  Status post recurrent V. fib cardiac arrest  Status post Vent dependent respiratory failure secondary to above rule out aspiration /ARDS Coronary artery disease status post CABG STATUS post left cath small vessel disease  Uncontrolled Hypertension  Diabetes mellitus  Hypercholesteremia  Status post seizures  Plan Continue present management per CCM  Schedule for ICD next week  LOS: 9 days     Cheri Ayotte N 03/16/2013, 10:44 AM

## 2013-03-16 NOTE — Procedures (Signed)
Objective Swallowing Evaluation:    Patient Details  Name: Eric [name] MRN: [id number] Date of Birth: [birth year]  Today's Date: 03/16/2013 Time: 1630-1700 SLP Time Calculation (min): 30 min  Past Medical History:  Past Medical History  Diagnosis Date  . Hypertension   . Hyperlipemia   . 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 Edema    Past Surgical History:  Past Surgical History  Procedure Laterality Date  . Coronary artery bypass graft     HPI:  58 y/o male with PMH for CAD, CABG, DM, HTN admitted to ED following witnessed seizure at [facility].  Apneic and pulseless at scene.  CPR x5 minutes, king airway placed by EMS, intubated in ED, recurrent V fib arrest in ED required defibrillation.  CT indicates hydrocephalus with diffuse dilation to the lateral and third ventricles.  Intubated 2/13 to 2/18.  Postextubation dysphagia suspected following extubation.  BSE completed on 2/219 with recommendations of NPO.  F/u on 2/20with recommendations to continue NPO with exception of medication crushed in puree.  F/u on 2/21 indicates improvement in overall swallow ability to but continued with reduced ability to protect airway fully.  Early am on 2/22 desat to low 80's with increased confusion requiring Bi-Pap.   Per nursing patient maintaining oxygen saturations with nasal cannula with improved LOA.  MBS indicated to objectively assess for aspiration and determine safest, PO diet as patient continues with NPO status.    Assessment / Plan / Recommendation Clinical Impression  Dysphagia Diagnosis: Mild oral phase dysphagia;Mild pharyngeal phase dysphagia;Mild cervical esophageal phase dysphagia Mild to moderate pharyngeal dysphagia marked mainly by what appears to laryngeal edema affecting bolus transit.  All swallows triggered at valleculae space.  Bolus contained at level of C6 prior to completed transit.  Appeared to be artifact at C-6 radiologist present unable  to confirm.  Moderate amount of residuals at level of pyriforms requiring multiple swallows with use of chin tuck to clear.  Patient did not sense containment.    Due to narrowing of pharynx from what appeared to be edema trials of soft and regular solids and whole barium tablet deferred.  Brief esophageal screen indicates slow bolus transit with backflow noted to cervical esophagus with puree and thin liquid trials.  No penetration or aspiration events occurred.  Recommend to initiate dysphagia 1 (puree)  diet consistency and thin liquid with full supervision with all PO's due to noted cognitive deficits.  Recommend strict aspiration precautions and use of swallow strategies as aspiration risk remains high s/p swallow.  Diagnostic treatment completed following study focusing on diet recommendations and necessary swallow strategies to nursing.  ST to follow closely in acute care  for diet tolerance.  Recommend repeat MBS with clinical improvement prior to diet upgrade.      Treatment Recommendation       Diet Recommendation Dysphagia 1 (Puree);No mixed consistencies;Thin liquid   Liquid Administration via: Cup;Straw Medication Administration: Crushed with puree Supervision: Full supervision/cueing for compensatory strategies Compensations: Multiple dry swallows after each bite/sip;Effortful swallow Postural Changes and/or Swallow Maneuvers: Seated upright 90 degrees;Upright 30-60 min after meal;Chin tuck    Other  Recommendations Oral Care Recommendations: Oral care Q4 per protocol   Follow Up Recommendations  Inpatient Rehab    Frequency and Duration min 2x/week  2 weeks   Pertinent Vitals/Pain Oxygen saturations lower 90's on 6 L RR  Lower 20's    SLP Swallow Goals     General Date of Onset:  03/07/13 HPI: 58 y/o male with PMH for CAD, CABG, DM, HTN admitted to ED following witnessed seizure at [facility].  Apneic and pulseless at scene.  CPR x5 minutes, king airway placed by EMS,  intubated in ED, recurrent V fib aresst in ED required defibrillation.  CT indicates hydrocephalus with diffuse dilation to the lateral and third verntricles.  Intubated 2/13 to 2/18.  Postextubation dysphagia suspected following extubation.  BSE completed on 2/20 with recommendations of NPO.  F/u on 2/21 with recommendations to continue NPO with exception of medication crushed in puree.  Improvement noted in swallow ability on 2/21     Reason for Referral     Oral Phase Oral Preparation/Oral Phase Oral Phase: Impaired Oral - Thin Oral - Thin Teaspoon: Piecemeal swallowing;Holding of bolus;Reduced posterior propulsion Oral - Thin Cup: Piecemeal swallowing;Holding of bolus;Reduced posterior propulsion Oral - Thin Straw: Reduced posterior propulsion Oral - Solids Oral - Puree: Piecemeal swallowing;Reduced posterior propulsion Oral - Mechanical Soft: Not tested   Pharyngeal Phase Pharyngeal Phase Pharyngeal Phase: Impaired Pharyngeal - Nectar Pharyngeal - Nectar Teaspoon: Reduced pharyngeal peristalsis;Reduced anterior laryngeal mobility;Reduced laryngeal elevation;Reduced airway/laryngeal closure;Pharyngeal residue - pyriform sinuses Pharyngeal - Thin Pharyngeal - Thin Teaspoon: Reduced pharyngeal peristalsis;Reduced tongue base retraction;Reduced anterior laryngeal mobility;Reduced laryngeal elevation;Reduced airway/laryngeal closure;Pharyngeal residue - pyriform sinuses Pharyngeal - Thin Cup: Reduced pharyngeal peristalsis;Reduced tongue base retraction;Reduced anterior laryngeal mobility;Reduced laryngeal elevation;Reduced airway/laryngeal closure;Pharyngeal residue - pyriform sinuses Pharyngeal - Thin Straw: Reduced pharyngeal peristalsis;Reduced anterior laryngeal mobility;Reduced laryngeal elevation;Reduced airway/laryngeal closure;Pharyngeal residue - pyriform sinuses Pharyngeal - Solids Pharyngeal - Puree: Reduced pharyngeal peristalsis;Reduced tongue base retraction;Reduced anterior  laryngeal mobility;Reduced laryngeal elevation;Reduced airway/laryngeal closure;Pharyngeal residue - pyriform sinuses;Pharyngeal residue - cp segment  Cervical Esophageal Phase    GO    Cervical Esophageal Phase Cervical Esophageal Phase: Impaired Cervical Esophageal Phase - Thin Thin Straw: Prominent cricopharyngeal segment;Esophageal backflow into cervical esophagus Cervical Esophageal Phase - Solids Puree: Prominent cricopharyngeal segment;Esophageal backflow into cervical esophagus        Sharman Crate Fritch, CCC-SLP [phone] [facility] 03/16/2013, 8:57 PM

## 2013-03-16 NOTE — Progress Notes (Signed)
Pt continues to be SOB and tachypneic on NRB, states "it is hard to breathe". O2 sats at 92-93%. Pt becoming increasingly agitated, trying to get out of bed, and remains hypertensive. Notified Dr. Philbert Riser. New orders given, will continue to monitor.

## 2013-03-16 NOTE — Progress Notes (Signed)
Pt becoming increasingly SOB and unable to maintain O2 sats on nasal cannula. Coarse crackles auscultated throughout lung sounds. Placed on NRB and notified ELINK MD, as well as Dr. Philbert Riser. New orders given, will continue to monitor.

## 2013-03-16 NOTE — Progress Notes (Signed)
Dr. Sandi Mealy and Dr. Arnoldo Morale at bedside. Pt is now tolerating NRB, O2 sats at 98%.

## 2013-03-17 ENCOUNTER — Inpatient Hospital Stay (HOSPITAL_COMMUNITY): Payer: Medicaid Other

## 2013-03-17 LAB — CBC
HCT: 24 % — ABNORMAL LOW (ref 39.0–52.0)
Hemoglobin: 8.1 g/dL — ABNORMAL LOW (ref 13.0–17.0)
MCH: 31 pg (ref 26.0–34.0)
MCHC: 33.8 g/dL (ref 30.0–36.0)
MCV: 92 fL (ref 78.0–100.0)
PLATELETS: 212 10*3/uL (ref 150–400)
RBC: 2.61 MIL/uL — AB (ref 4.22–5.81)
RDW: 13.1 % (ref 11.5–15.5)
WBC: 9.9 10*3/uL (ref 4.0–10.5)

## 2013-03-17 LAB — BASIC METABOLIC PANEL
BUN: 26 mg/dL — ABNORMAL HIGH (ref 6–23)
CO2: 29 mEq/L (ref 19–32)
CREATININE: 1.02 mg/dL (ref 0.50–1.35)
Calcium: 8.4 mg/dL (ref 8.4–10.5)
Chloride: 109 mEq/L (ref 96–112)
GFR, EST NON AFRICAN AMERICAN: 79 mL/min — AB (ref >90)
GLUCOSE: 104 mg/dL — AB (ref 70–99)
Potassium: 3 mEq/L — ABNORMAL LOW (ref 3.7–5.3)
Sodium: 148 mEq/L — ABNORMAL HIGH (ref 137–147)

## 2013-03-17 LAB — COMPREHENSIVE METABOLIC PANEL
ALBUMIN: 2 g/dL — AB (ref 3.5–5.2)
ALK PHOS: 58 U/L (ref 39–117)
ALT: 61 U/L — ABNORMAL HIGH (ref 0–53)
AST: 64 U/L — AB (ref 0–37)
BILIRUBIN TOTAL: 0.5 mg/dL (ref 0.3–1.2)
BUN: 25 mg/dL — ABNORMAL HIGH (ref 6–23)
CHLORIDE: 103 meq/L (ref 96–112)
CO2: 27 meq/L (ref 19–32)
Calcium: 8 mg/dL — ABNORMAL LOW (ref 8.4–10.5)
Creatinine, Ser: 0.95 mg/dL (ref 0.50–1.35)
GFR calc Af Amer: >90 mL/min (ref >90)
GFR calc non Af Amer: 89 mL/min — ABNORMAL LOW (ref >90)
Glucose, Bld: 133 mg/dL — ABNORMAL HIGH (ref 70–99)
POTASSIUM: 3.5 meq/L — AB (ref 3.7–5.3)
Sodium: 140 mEq/L (ref 137–147)
Total Protein: 7.2 g/dL (ref 6.0–8.3)

## 2013-03-17 LAB — CULTURE, RESPIRATORY W GRAM STAIN

## 2013-03-17 LAB — GLUCOSE, CAPILLARY
GLUCOSE-CAPILLARY: 177 mg/dL — AB (ref 70–99)
Glucose-Capillary: 125 mg/dL — ABNORMAL HIGH (ref 70–99)
Glucose-Capillary: 134 mg/dL — ABNORMAL HIGH (ref 70–99)
Glucose-Capillary: 123 mg/dL — ABNORMAL HIGH (ref 70–99)
Glucose-Capillary: 145 mg/dL — ABNORMAL HIGH (ref 70–99)

## 2013-03-17 LAB — CULTURE, RESPIRATORY

## 2013-03-17 LAB — MAGNESIUM: Magnesium: 2.1 mg/dL (ref 1.5–2.5)

## 2013-03-17 MED ORDER — POTASSIUM CHLORIDE 10 MEQ/100ML IV SOLN
10.0000 meq | INTRAVENOUS | Status: AC
  Administered 2013-03-17 (×2): 10 meq via INTRAVENOUS
  Filled 2013-03-17 (×2): qty 100

## 2013-03-17 MED ORDER — ISOSORBIDE MONONITRATE ER 60 MG PO TB24
60.0000 mg | ORAL_TABLET | Freq: Every day | ORAL | Status: DC
  Administered 2013-03-17 – 2013-03-25 (×9): 60 mg via ORAL
  Filled 2013-03-17 (×9): qty 1

## 2013-03-17 MED ORDER — SPIRONOLACTONE 25 MG PO TABS
25.0000 mg | ORAL_TABLET | Freq: Every day | ORAL | Status: DC
  Administered 2013-03-18 – 2013-03-24 (×7): 25 mg via ORAL
  Filled 2013-03-17 (×7): qty 1

## 2013-03-17 MED ORDER — SODIUM CHLORIDE 0.9 % IV SOLN
INTRAVENOUS | Status: DC
  Administered 2013-03-17: 15:00:00 via INTRAVENOUS

## 2013-03-17 MED ORDER — METOPROLOL TARTRATE 50 MG PO TABS
75.0000 mg | ORAL_TABLET | Freq: Three times a day (TID) | ORAL | Status: DC
  Administered 2013-03-17 – 2013-03-25 (×24): 75 mg via ORAL
  Filled 2013-03-17 (×26): qty 1

## 2013-03-17 MED ORDER — INSULIN ASPART 100 UNIT/ML ~~LOC~~ SOLN
0.0000 [IU] | Freq: Three times a day (TID) | SUBCUTANEOUS | Status: DC
  Administered 2013-03-18: 2 [IU] via SUBCUTANEOUS
  Administered 2013-03-18: 3 [IU] via SUBCUTANEOUS
  Administered 2013-03-19 (×3): 1 [IU] via SUBCUTANEOUS
  Administered 2013-03-20: 2 [IU] via SUBCUTANEOUS
  Administered 2013-03-20: 5 [IU] via SUBCUTANEOUS
  Administered 2013-03-21 (×2): 2 [IU] via SUBCUTANEOUS
  Administered 2013-03-22: 3 [IU] via SUBCUTANEOUS
  Administered 2013-03-22: 1 [IU] via SUBCUTANEOUS
  Administered 2013-03-23: 2 [IU] via SUBCUTANEOUS
  Administered 2013-03-23: 3 [IU] via SUBCUTANEOUS
  Administered 2013-03-23: 1 [IU] via SUBCUTANEOUS
  Administered 2013-03-24: 3 [IU] via SUBCUTANEOUS
  Administered 2013-03-24: 1 [IU] via SUBCUTANEOUS
  Administered 2013-03-25: 2 [IU] via SUBCUTANEOUS
  Administered 2013-03-25: 1 [IU] via SUBCUTANEOUS

## 2013-03-17 MED ORDER — ISOSORBIDE MONONITRATE ER 30 MG PO TB24
30.0000 mg | ORAL_TABLET | Freq: Every day | ORAL | Status: DC
  Filled 2013-03-17: qty 1

## 2013-03-17 MED ORDER — NITROGLYCERIN IN D5W 200-5 MCG/ML-% IV SOLN: 2.0000 ug/min | INTRAVENOUS | Status: DC

## 2013-03-17 MED ORDER — POTASSIUM CHLORIDE CRYS ER 20 MEQ PO TBCR
40.0000 meq | EXTENDED_RELEASE_TABLET | Freq: Once | ORAL | Status: AC
  Administered 2013-03-17: 40 meq via ORAL
  Filled 2013-03-17: qty 2

## 2013-03-17 NOTE — Progress Notes (Signed)
Subjective:  Patient denies any chest pain or shortness of breath. Walked in the hallway today. Still remains on IV nitroglycerin  Objective:  Vital Signs in the last 24 hours: Temp:  [97.1 F (36.2 C)-99.9 F (37.7 C)] 98.6 F (37 C) (02/23 0742) Pulse Rate:  [76-106] 106 (02/23 1000) Resp:  [17-39] 26 (02/23 1000) BP: (112-172)/(46-92) 112/46 mmHg (02/23 1000) SpO2:  [90 %-97 %] 97 % (02/23 1000) Weight:  [73.5 kg (162 lb 0.6 oz)] 73.5 kg (162 lb 0.6 oz) (02/23 0400)  Intake/Output from previous day: 02/22 0701 - 02/23 0700 In: 1982.9 [I.V.:1582.9; IV Piggyback:400] Out: 1700 [Urine:1700] Intake/Output from this shift: Total I/O In: 368.3 [I.V.:268.3; IV Piggyback:100] Out: -   Physical Exam: Neck: no adenopathy, no carotid bruit, no JVD and supple, symmetrical, trachea midline Lungs: decrease breath sounds at bases air entry improved Heart: regular rate and rhythm, S1, S2 normal and soft systolic murmur noted Abdomen: soft, non-tender; bowel sounds normal; no masses,  no organomegaly Extremities: extremities normal, atraumatic, no cyanosis or edema  Lab Results:  Recent Labs  03/15/13 0405 03/16/13 0405  WBC 8.9 12.8*  HGB 9.2* 9.5*  PLT 206 236    Recent Labs  03/16/13 0405 03/17/13 0400  NA 149* 148*  K 3.6* 3.0*  CL 110 109  CO2 28 29  GLUCOSE 172* 104*  BUN 28* 26*  CREATININE 0.96 1.02   No results found for this basename: TROPONINI, CK, MB,  in the last 72 hours Hepatic Function Panel No results found for this basename: PROT, ALBUMIN, AST, ALT, ALKPHOS, BILITOT, BILIDIR, IBILI,  in the last 72 hours No results found for this basename: CHOL,  in the last 72 hours No results found for this basename: PROTIME,  in the last 72 hours  Imaging: Imaging results have been reviewed and Dg Chest Port 1 View  03/17/2013   CLINICAL DATA:  58 year old male with acute respiratory failure. Cardiac arrest. Initial encounter.  EXAM: PORTABLE CHEST - 1 VIEW   COMPARISON:  03/15/2013 and earlier.  FINDINGS: Portable AP upright view at 0736 hr. Stable left IJ central line. Stable cardiac size and mediastinal contours. No pneumothorax. No pleural effusion identified. Decreased widespread indistinct pulmonary opacity, residual now mostly in both upper lobes. No areas of worsening ventilation.  IMPRESSION: Regressed bilateral indistinct pulmonary opacity, favor resolving pulmonary edema. Residual in both upper lungs.   Electronically Signed   By: Lars Pinks M.D.   On: 03/17/2013 08:09   Dg Chest Port 1 View  03/15/2013   CLINICAL DATA:  Hypoxia.  Follow-up pulmonary edema.  EXAM: PORTABLE CHEST - 1 VIEW  COMPARISON:  DG CHEST 1V PORT dated 03/14/2013; DG CHEST 1V PORT dated 03/13/2013; DG CHEST 1V PORT dated 03/13/2013; DG CHEST 1V PORT dated 03/12/2013  FINDINGS: Prior sternotomy for CABG. Cardiac silhouette moderately enlarged but stable. Interval worsening of interstitial and airspace opacities throughout both lungs. Small bilateral pleural effusions suspected. Left jugular central venous catheter tip projects over the lower SVC, unchanged.  IMPRESSION: Interval worsening of now severe interstitial and airspace pulmonary edema throughout both lungs.   Electronically Signed   By: Evangeline Dakin M.D.   On: 03/15/2013 19:33   Dg Swallowing Func-speech Pathology  03/16/2013   Marcille Buffy, CCC-SLP     03/16/2013  9:23 PM Objective Swallowing Evaluation:    Patient Details  Name: Eric [name] MRN: [id number] Date of Birth: [birth year]  Today's Date: 03/16/2013 Time: 1630-1700 SLP Time Calculation (min): 30 min  Past Medical History:  Past Medical History  Diagnosis Date  . Hypertension   . Hyperlipemia   . 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 Edema    Past Surgical History:  Past Surgical History  Procedure Laterality Date  . Coronary artery bypass graft     HPI:  58 y/o male with PMH for CAD, CABG, DM, HTN admitted to ED  following witnessed  seizure at [facility].  Apneic and  pulseless at scene.  CPR x5 minutes, king airway placed by EMS,  intubated in ED, recurrent V fib arrest in ED required  defibrillation.  CT indicates hydrocephalus with diffuse dilation  to the lateral and third ventricles.  Intubated 2/13 to 2/18.   Postextubation dysphagia suspected following extubation.  BSE  completed on 2/219 with recommendations of NPO.  F/u on 2/20with  recommendations to continue NPO with exception of medication  crushed in puree.  F/u on 2/21 indicates improvement in overall  swallow ability to but continued with reduced ability to protect  airway fully.  Early am on 2/22 desat to low 80's with increased  confusion requiring Bi-Pap.   Per nursing patient maintaining  oxygen saturations with nasal cannula with improved LOA.  MBS  indicated to objectively assess for aspiration and determine  safest, PO diet as patient continues with NPO status.    Assessment / Plan / Recommendation Clinical Impression  Dysphagia Diagnosis: Mild oral phase dysphagia;Mild pharyngeal  phase dysphagia;Mild cervical esophageal phase dysphagia Mild to moderate pharyngeal dysphagia marked mainly by what  appears to laryngeal edema affecting bolus transit.  All swallows  triggered at valleculae space.  Bolus contained at level of C6  prior to completed transit.  Appeared to be artifact at C-6  radiologist present unable to confirm.  Moderate amount of  residuals at level of pyriforms requiring multiple swallows with  use of chin tuck to clear.  Patient did not sense containment.     Due to narrowing of pharynx from what appeared to be edema trials  of soft and regular solids and whole barium tablet deferred.   Brief esophageal screen indicates slow bolus transit with  backflow noted to cervical esophagus with puree and thin liquid  trials.  No penetration or aspiration events occurred.  Recommend  to initiate dysphagia 1 (puree)  diet consistency and thin liquid  with full  supervision with all PO's due to noted cognitive  deficits.  Recommend strict aspiration precautions and use of  swallow strategies as aspiration risk remains high s/p swallow.   Diagnostic treatment completed following study focusing on diet  recommendations and necessary swallow strategies to nursing.  ST  to follow closely in acute care  for diet tolerance.  Recommend  repeat MBS with clinical improvement prior to diet upgrade.      Treatment Recommendation       Diet Recommendation Dysphagia 1 (Puree);No mixed  consistencies;Thin liquid   Liquid Administration via: Cup;Straw Medication Administration: Crushed with puree Supervision: Full supervision/cueing for compensatory strategies Compensations: Multiple dry swallows after each  bite/sip;Effortful swallow Postural Changes and/or Swallow Maneuvers: Seated upright 90  degrees;Upright 30-60 min after meal;Chin tuck    Other  Recommendations Oral Care Recommendations: Oral care Q4  per protocol   Follow Up Recommendations  Inpatient Rehab    Frequency and Duration min 2x/week  2 weeks   Pertinent Vitals/Pain Oxygen saturations lower 90's on 6 L RR   Lower 20's    SLP  Swallow Goals     General Date of Onset: 03/07/13 HPI: 58 y/o male with PMH for CAD, CABG, DM, HTN admitted to ED  following witnessed seizure at [employer].  Apneic and  pulseless at scene.  CPR x5 minutes, king airway placed by EMS,  intubated in ED, recurrent V fib aresst in ED required  defibrillation.  CT indicates hydrocephalus with diffuse dilation  to the lateral and third verntricles.  Intubated 2/13 to 2/18.   Postextubation dysphagia suspected following extubation.  BSE  completed on 2/20 with recommendations of NPO.  F/u on 2/21 with  recommendations to continue NPO with exception of medication  crushed in puree.  Improvement noted in swallow ability on 2/21     Reason for Referral     Oral Phase Oral Preparation/Oral Phase Oral Phase: Impaired Oral - Thin Oral - Thin Teaspoon:  Piecemeal swallowing;Holding of  bolus;Reduced posterior propulsion Oral - Thin Cup: Piecemeal swallowing;Holding of bolus;Reduced  posterior propulsion Oral - Thin Straw: Reduced posterior propulsion Oral - Solids Oral - Puree: Piecemeal swallowing;Reduced posterior propulsion Oral - Mechanical Soft: Not tested   Pharyngeal Phase Pharyngeal Phase Pharyngeal Phase: Impaired Pharyngeal - Nectar Pharyngeal - Nectar Teaspoon: Reduced pharyngeal  peristalsis;Reduced anterior laryngeal mobility;Reduced laryngeal  elevation;Reduced airway/laryngeal closure;Pharyngeal residue -  pyriform sinuses Pharyngeal - Thin Pharyngeal - Thin Teaspoon: Reduced pharyngeal  peristalsis;Reduced tongue base retraction;Reduced anterior  laryngeal mobility;Reduced laryngeal elevation;Reduced  airway/laryngeal closure;Pharyngeal residue - pyriform sinuses Pharyngeal - Thin Cup: Reduced pharyngeal peristalsis;Reduced  tongue base retraction;Reduced anterior laryngeal  mobility;Reduced laryngeal elevation;Reduced airway/laryngeal  closure;Pharyngeal residue - pyriform sinuses Pharyngeal - Thin Straw: Reduced pharyngeal peristalsis;Reduced  anterior laryngeal mobility;Reduced laryngeal elevation;Reduced  airway/laryngeal closure;Pharyngeal residue - pyriform sinuses Pharyngeal - Solids Pharyngeal - Puree: Reduced pharyngeal peristalsis;Reduced tongue  base retraction;Reduced anterior laryngeal mobility;Reduced  laryngeal elevation;Reduced airway/laryngeal closure;Pharyngeal  residue - pyriform sinuses;Pharyngeal residue - cp segment  Cervical Esophageal Phase    GO    Cervical Esophageal Phase Cervical Esophageal Phase: Impaired Cervical Esophageal Phase - Thin Thin Straw: Prominent cricopharyngeal segment;Esophageal backflow  into cervical esophagus Cervical Esophageal Phase - Solids Puree: Prominent cricopharyngeal segment;Esophageal backflow into  cervical esophagus        Sharman Crate MS, CCC-SLP [phone] [facility] 03/16/2013, 8:57 PM      Cardiac Studies:  Assessment/Plan:  Status post recurrent V. fib cardiac arrest  Status post Vent dependent respiratory failure secondary to above rule out aspiration /ARDS  Coronary artery disease status post CABG STATUS post left cath small vessel disease  Uncontrolled Hypertension  Diabetes mellitus  Hypercholesteremia  Status post seizures  Plan Wean off nitro drip Start Imdur as per orders Increase Aldactone as per orders  LOS: 10 days    Hernando Reali N 03/17/2013, 11:42 AM

## 2013-03-17 NOTE — Progress Notes (Signed)
Speech Language Pathology Treatment: Dysphagia  Patient Details Name: Eric [name] MRN: [id number] DOB: 08-01-1955 Today's Date: 03/17/2013 Time: 1209-1223 SLP Time Calculation (min): 14 min  Assessment / Plan / Recommendation Clinical Impression  Pt tolerating diet well per RN and family. Family providing full supervision with meals and cueing pt for strategies. Pt followed strategies with therapist with moderate verbal cues. Pt demonstrated ability to fully masticate cracker and transit with difficulty, will upgrade solids to dys 2 (ground) with continued supervision from family. SLP to continue for further trials.    HPI HPI: 58 y/o male with PMH for CAD, CABG, DM, HTN admitted to ED following witnessed seizure at [employer].  Apneic and pulseless at scene.  CPR x5 minutes, king airway placed by EMS, intubated in ED, recurrent V fib aresst in ED required defibrillation.  CT indicates hydrocephalus with diffuse dilation to the lateral and third verntricles.  Intubated 2/13 to 2/18.  Postextubation dysphagia suspected following extubation.  BSE completed on 2/20 with recommendations of NPO.  F/u on 2/21 with recommendations to continue NPO with exception of medication crushed in puree.  Improvement noted in swallow ability on 2/21    Pertinent Vitals NA  SLP Plan  Continue with current plan of care    Recommendations Diet recommendations: Dysphagia 2 (fine chop);Thin liquid Liquids provided via: Cup;Straw Medication Administration: Crushed with puree Supervision: Full supervision/cueing for compensatory strategies Compensations: Multiple dry swallows after each bite/sip;Effortful swallow Postural Changes and/or Swallow Maneuvers: Seated upright 90 degrees;Upright 30-60 min after meal;Chin tuck              General recommendations: Rehab consult Oral Care Recommendations: Oral care Q4 per protocol Follow up Recommendations: Inpatient Rehab Plan: Continue with current plan of care     GO    [facility], [place] CCC-SLP [id number]  Lynann Beaver 03/17/2013, 1:23 PM

## 2013-03-17 NOTE — Progress Notes (Signed)
Physical Therapy Treatment Patient Details Name: Eric [name] MRN: [id number] DOB: August 22, 1955 Today's Date: 03/17/2013 Time: [id number] PT Time Calculation (min): 24 min  PT Assessment / Plan / Recommendation  History of Present Illness 58 yo with witnessed sz at [employer]. Apneic and pulseless at scene. CPR x 5 minutes, king airway placed by EMS, intubated in ED. 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 ETT 2/13-2/18   PT Comments   Pt admitted with above. Pt currently with functional limitations due to balance and endurance deficits.  Pt progressing with ambulation today.  Less confused.  Pt will benefit from skilled PT to increase their independence and safety with mobility to allow discharge to the venue listed below.    Follow Up Recommendations  CIR;Supervision/Assistance - 24 hour     Does the patient have the potential to tolerate intense rehabilitation     Barriers to Discharge        Equipment Recommendations  Rolling walker with 5" wheels    Recommendations for Other Services Rehab consult;OT consult  Frequency Min 3X/week   Progress towards PT Goals Progress towards PT goals: Progressing toward goals  Plan Current plan remains appropriate    Precautions / Restrictions Precautions Precautions: Fall Restrictions Weight Bearing Restrictions: No   Pertinent Vitals/Pain O2 on 6LO2 with ambulation with sat >93%.  BP and HR stable.  No pain.     Mobility  Bed Mobility Overal bed mobility: Needs Assistance Bed Mobility: Supine to Sit Supine to sit: Min assist General bed mobility comments: cues for hand placement and to scoot forward needing incr time.   Transfers Overall transfer level: Needs assistance Equipment used: Rolling walker (2 wheeled) Transfers: Sit to/from Stand Sit to Stand: Mod assist;+2 physical assistance;From elevated surface General transfer comment: pt with max cues for safety, hand placement and sequence with transfers as pt  impulsive trying to stand and move despite poor balance. Needs incr time to obtain standing balance with fairly large posterior lean.   Ambulation/Gait Ambulation/Gait assistance: Mod assist;+2 physical assistance Ambulation Distance (Feet): 51 Feet (31 feet and 21 feet) Assistive device: Rolling walker (2 wheeled) Gait Pattern/deviations: Step-to pattern;Decreased step length - right;Decreased step length - left;Decreased stride length;Decreased dorsiflexion - right;Decreased dorsiflexion - left;Ataxic;Leaning posteriorly;Trunk flexed;Narrow base of support Gait velocity interpretation: Below normal speed for age/gender General Gait Details: pt with posterior lean with poor balance. Max assist to direct and advance RW and assist for balance and sequence throughout with cues for safety Pt needed constant cues for anterior pelvic tile tactiilly and verbally as well as cues to separate feet and stand upright.  Pt with overall poor postural stability.  Lots of cues and assssit needed for safety.  Followed pt with chair as well.      Exercises General Exercises - Lower Extremity Ankle Circles/Pumps: AROM;Both;5 reps;Seated Quad Sets: AROM;Both;10 reps;Supine Heel Slides: AROM;Both;10 reps;Supine Hip ABduction/ADduction: AROM;Both;10 reps;Supine   PT Diagnosis:    PT Problem List:   PT Treatment Interventions:     PT Goals (current goals can now be found in the care plan section)    Visit Information  Last PT Received On: 03/17/13 Assistance Needed: +2 History of Present Illness: 58 yo with witnessed sz at [employer]. Apneic and pulseless at scene. CPR x 5 minutes, king airway placed by EMS, intubated in ED. 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 ETT 2/13-2/18    Subjective Data  Subjective: "I feel better.  I want to sit up."  Cognition  Cognition Arousal/Alertness: Awake/alert Behavior During Therapy: Flat affect;Impulsive Overall Cognitive Status: Impaired/Different  from baseline Area of Impairment: Memory;Problem solving;Safety/judgement Memory: Decreased short-term memory Safety/Judgement: Decreased awareness of deficits;Decreased awareness of safety Problem Solving: Difficulty sequencing;Slow processing;Requires verbal cues;Requires tactile cues General Comments: Somewhat improved with a little less impulsivity and decr confusion.     Balance  Balance Overall balance assessment: Needs assistance;History of Falls Sitting-balance support: No upper extremity supported;Feet supported Sitting balance-Leahy Scale: Good Postural control: Posterior lean Standing balance support: Bilateral upper extremity supported;During functional activity Standing balance-Leahy Scale: Poor Standing balance comment: Needs RW for support in standing.   End of Session PT - End of Session Equipment Utilized During Treatment: Gait belt;Oxygen Activity Tolerance: Patient tolerated treatment well Patient left: in chair;with call bell/phone within reach;with family/visitor present Nurse Communication: Mobility status;Precautions   GP     [name],Eric [name] 03/17/2013, 11:11 AM [facility] [phone] (pager)

## 2013-03-17 NOTE — Progress Notes (Signed)
[facility] TEAM 1 - Stepdown/ICU TEAM Progress Note  Eric [name] [id number] DOB: 07/29/1955 DOA: 03/07/2013 PCP: No primary provider on file.  Admit HPI / Brief Narrative: 58 yo with witnessed convulstions at a [employer]. Apneic and pulseless at scene. CPR x 5 minutes , king airway placed by EMS, intubated in ED. Receives his care in [place]. 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   SIGNIFICANT EVENTS / STUDIES:  2/13 CT head: Hydrocephalus, with diffuse dilatation of the lateral and third ventricles.  2/13 Cards consult Terrence Dupont) and LHC: Cardiac cath with severe native vessel coronary artery disease but patent SVGs and LIMA  2/13 Neuro Consult 2/14 EEG: discontinuous slow background - no epileptiform activity noted 2/14 Amiodarone discontinued due to sinus bradycardia  03/09/13: Rewarming started - Episode of VT  ETT 2/13 >>2/18  HPI/Subjective: Pt alert this am. Family at bedside and assisting pt with breakfast tray. Pt asking to be allowed to get OOB.  Assessment/Plan:  Recurrent VF arrest likely the primary event leading to his collapse prior to presentation - Cardiology following - EP planning ICD insertion over the next few days - EF on TTE 2/20 is normal at 55-60% w/ no WMA  VDRF post arrest Extubated 2/18 - no evidence of primary pulmonary issues   Sinus bradycardia on amiodarone  Resolved w/ d/c of amio   Recurrent VT off amiodarone As per Cardiology - ICD pending  Encephalopathy  secondary to his anoxic event possibly coupled with ICU delirium - appears to worsen each night c/w sundowning - cont scheduled seroquel and follow   Uncontrolled Hypertension  Recent issues with increased BP with agitation leading to flash pulm edema - better on PO meds and IV NTG - will increase BB - ACE I already at max dose - will add Imdur and wean/dc IV NTG  Hypoxia Still requiring oxygen but has weaned - [facility] 2/22 c/w edema - rpt CXR in am - has  preserved LV fnx - in positive balance - UOP 1700 cc 2/22  Hypernatremia  Follow off IVFs since taking in POs - BMET in am  Hypokalemia -replete  Anemia of critical illness  Keep Hgb 8.0 or > - stable at present  Mild thrombocytopenia Resolved  Klebsiella in sputum Remains on unasyn - afeb - WBC normal - complete 7 days of tx   DM2 Reasonably controlled at present - follow trend   Hydrocephalus Appears congenital on MRI - no further interventions at this time  Seizures Not clear if seizure > arrest vs arrest > convulsive syncope or seizure - significant doubt that this represents seizure as a primary event - not on AED - EEG w/o epileptiform activity   Nutrition  Currently tolerating diet w/o difficulty  Code Status: FULL Family Communication: spoke w/ daughter and wife at bedside  Disposition Plan: SDU  Consultants: PCCM Cardiology Neurology  EP  Antibiotics: Vancomycin 2/17>>>2/19  Zosyn 2/17>>>2/19  Unasyn 2/19>>>  DVT prophylaxis: SQ heparin   Objective: Blood pressure 119/82, pulse 84, temperature 97.5 F (36.4 C), temperature source Oral, resp. rate 25, height 6' (1.829 m), weight 162 lb 0.6 oz (73.5 kg), SpO2 96.00%.  Intake/Output Summary (Last 24 hours) at 03/17/13 1314 Last data filed at 03/17/13 1100  Gross per 24 hour  Intake 1906.25 ml  Output   1250 ml  Net 656.25 ml   Exam: General: No acute respiratory distress at rest - 5 L Lungs: CTA th/o w/o wheezes  Cardiovascular: Regular rate and  rhythm without murmur gallop or rub  Abdomen: Nontender, nondistended, soft, bowel sounds positive, no rebound, no ascites, no appreciable mass Extremities: No significant cyanosis, clubbing;  No edema bilateral lower extremities  Data Reviewed: Basic Metabolic Panel:  Recent Labs Lab 03/11/13 0344 03/12/13 0410 03/13/13 0345 03/14/13 0325 03/15/13 0405 03/16/13 0405 03/17/13 0400  NA 142 141 144 148* 152* 149* 148*  K 4.2 3.9 3.8 4.1 4.0  3.6* 3.0*  CL 107 108 105 111 113* 110 109  CO2 20 20 23 23 25 28 29   GLUCOSE 160* 195* 138* 155* 174* 172* 104*  BUN 27* 28* 25* 24* 25* 28* 26*  CREATININE 1.56* 1.05 1.08 0.98 0.88 0.96 1.02  CALCIUM 8.2* 8.1* 8.2* 8.7 8.9 8.8 8.4  MG 2.2 2.4 2.1 2.2 2.5  --   --   PHOS 4.1 2.4 3.1 3.5 3.8  --   --    Liver Function Tests: No results found for this basename: AST, ALT, ALKPHOS, BILITOT, PROT, ALBUMIN,  in the last 168 hours  CBC:  Recent Labs Lab 03/11/13 0344 03/12/13 0410 03/13/13 0345 03/14/13 0325 03/15/13 0405 03/16/13 0405  WBC 6.9 5.4 6.7 7.8 8.9 12.8*  NEUTROABS 5.6 4.1 4.6 5.5  --   --   HGB 10.2* 9.1* 9.0* 9.2* 9.2* 9.5*  HCT 29.5* 26.2* 27.3* 27.3* 28.1* 28.3*  MCV 91.0 90.7 91.6 91.6 93.4 94.0  PLT 143* 130* 161 197 206 236   Cardiac Enzymes:  Recent Labs Lab 03/11/13 0340  TROPONINI <0.30   BNP (last 3 results)  Recent Labs  03/11/13 0340  PROBNP 370.5*   CBG:  Recent Labs Lab 03/16/13 1446 03/16/13 2041 03/17/13 0216 03/17/13 0744 03/17/13 1232  GLUCAP 168* 207* 125* 134* 177*    Recent Results (from the past 240 hour(s))  MRSA PCR SCREENING     Status: None   Collection Time    03/07/13  2:35 PM      Result Value Ref Range Status   MRSA by PCR NEGATIVE  NEGATIVE Final   Comment:            The GeneXpert MRSA Assay (FDA     approved for NASAL specimens     only), is one component of a     comprehensive MRSA colonization     surveillance program. It is not     intended to diagnose MRSA     infection nor to guide or     monitor treatment for     MRSA infections.  CULTURE, BLOOD (ROUTINE X 2)     Status: None   Collection Time    03/07/13  7:42 PM      Result Value Ref Range Status   Specimen Description BLOOD LEFT HAND   Final   Special Requests BOTTLES DRAWN AEROBIC ONLY 1CC   Final   Culture  Setup Time     Final   Value: 03/08/2013 00:40     Performed at [facility]   Culture     Final   Value: NO GROWTH 5  DAYS     Performed at [facility]   Report Status 03/14/2013 FINAL   Final  CULTURE, BLOOD (ROUTINE X 2)     Status: None   Collection Time    03/07/13  7:53 PM      Result Value Ref Range Status   Specimen Description BLOOD RIGHT HAND   Final   Special Requests BOTTLES DRAWN AEROBIC ONLY [place]  Final   Culture  Setup Time     Final   Value: 03/08/2013 00:40     Performed at [facility]   Culture     Final   Value: NO GROWTH 5 DAYS     Performed at [facility]   Report Status 03/14/2013 FINAL   Final  URINE CULTURE     Status: None   Collection Time    03/10/13 11:13 AM      Result Value Ref Range Status   Specimen Description URINE, CATHETERIZED   Final   Special Requests NONE   Final   Culture  Setup Time     Final   Value: 03/10/2013 17:12     Performed at [place]     Final   Value: 75,000 COLONIES/ML     Performed at [facility]   Culture     Final   Value: STAPHYLOCOCCUS SPECIES (COAGULASE NEGATIVE)     Note: RIFAMPIN AND GENTAMICIN SHOULD NOT BE USED AS SINGLE DRUGS FOR TREATMENT OF STAPH INFECTIONS.     Performed at [facility]   Report Status 03/12/2013 FINAL   Final   Organism ID, Bacteria STAPHYLOCOCCUS SPECIES (COAGULASE NEGATIVE)   Final  CULTURE, RESPIRATORY (NON-EXPECTORATED)     Status: None   Collection Time    03/10/13 11:30 AM      Result Value Ref Range Status   Specimen Description TRACHEAL ASPIRATE   Final   Special Requests NONE   Final   Gram Stain     Final   Value: ABUNDANT WBC PRESENT, PREDOMINANTLY PMN     RARE SQUAMOUS EPITHELIAL CELLS PRESENT     ABUNDANT GRAM VARIABLE ROD     FEW GRAM POSITIVE COCCI     IN CLUSTERS   Culture     Final   Value: MODERATE KLEBSIELLA OXYTOCA     MODERATE KLEBSIELLA PNEUMONIAE     Performed at [facility]   Report Status 03/17/2013 FINAL   Final   Organism ID, Bacteria KLEBSIELLA OXYTOCA   Final   Organism ID, Bacteria  KLEBSIELLA PNEUMONIAE   Final  CULTURE, BLOOD (ROUTINE X 2)     Status: None   Collection Time    03/10/13 11:42 AM      Result Value Ref Range Status   Specimen Description BLOOD LEFT ANTECUBITAL   Final   Special Requests BOTTLES DRAWN AEROBIC AND ANAEROBIC 10CC   Final   Culture  Setup Time     Final   Value: 03/10/2013 16:16     Performed at [facility]   Culture     Final   Value: NO GROWTH 5 DAYS     Performed at [facility]   Report Status 03/16/2013 FINAL   Final  CULTURE, BLOOD (ROUTINE X 2)     Status: None   Collection Time    03/10/13 11:50 AM      Result Value Ref Range Status   Specimen Description BLOOD LEFT HAND   Final   Special Requests BOTTLES DRAWN AEROBIC ONLY 10CC   Final   Culture  Setup Time     Final   Value: 03/10/2013 16:16     Performed at [facility]   Culture     Final   Value: NO GROWTH 5 DAYS     Performed at [facility]   Report Status 03/16/2013 FINAL   Final  Studies:  Recent x-ray studies have been reviewed in detail by the Attending Physician  Scheduled Meds:  Scheduled Meds: . amLODipine  10 mg Oral Daily  . ampicillin-sulbactam (UNASYN) IV  3 g Intravenous Q6H  . antiseptic oral rinse  15 mL Mouth Rinse BID  . aspirin  81 mg Oral Daily  . atorvastatin  40 mg Oral q1800  . heparin subcutaneous  5,000 Units Subcutaneous 3 times per day  . insulin aspart  0-9 Units Subcutaneous Q6H  . isosorbide mononitrate  60 mg Oral Daily  . lisinopril  20 mg Oral BID  . metoprolol tartrate  75 mg Oral TID  . potassium chloride  40 mEq Oral Once  . QUEtiapine  12.5 mg Oral QHS  . [START ON 03/18/2013] spironolactone  25 mg Oral Daily    Time spent on care of this patient: 35 mins   ELLIS,ALLISON L. ANP  [facility] Office  [phone] Pager - Text Page per Shea Evans as per below:  On-Call/Text Page:      [email or web address]      password [email or web address]  If 7PM-7AM, please contact  night-coverage [email or web address] Password [email or web address] 03/17/2013, 1:14 PM   LOS: 10 days   I have personally examined this patient and reviewed the entire database. I have reviewed the above note, made any necessary editorial changes, and agree with its content.  Cherene Altes, MD [facility]

## 2013-03-18 ENCOUNTER — Inpatient Hospital Stay (HOSPITAL_COMMUNITY): Payer: Medicaid Other

## 2013-03-18 DIAGNOSIS — I4901 Ventricular fibrillation: Principal | ICD-10-CM

## 2013-03-18 DIAGNOSIS — J9600 Acute respiratory failure, unspecified whether with hypoxia or hypercapnia: Secondary | ICD-10-CM

## 2013-03-18 LAB — CBC
HCT: 24.5 % — ABNORMAL LOW (ref 39.0–52.0)
Hemoglobin: 8.5 g/dL — ABNORMAL LOW (ref 13.0–17.0)
MCH: 31.7 pg (ref 26.0–34.0)
MCHC: 34.7 g/dL (ref 30.0–36.0)
MCV: 91.4 fL (ref 78.0–100.0)
Platelets: 210 10*3/uL (ref 150–400)
RBC: 2.68 MIL/uL — ABNORMAL LOW (ref 4.22–5.81)
RDW: 13.1 % (ref 11.5–15.5)
WBC: 7.1 10*3/uL (ref 4.0–10.5)

## 2013-03-18 LAB — BASIC METABOLIC PANEL
BUN: 20 mg/dL (ref 6–23)
CO2: 26 meq/L (ref 19–32)
CREATININE: 0.88 mg/dL (ref 0.50–1.35)
Calcium: 8.1 mg/dL — ABNORMAL LOW (ref 8.4–10.5)
Chloride: 103 mEq/L (ref 96–112)
GFR calc Af Amer: >90 mL/min (ref >90)
GFR calc non Af Amer: >90 mL/min (ref >90)
Glucose, Bld: 115 mg/dL — ABNORMAL HIGH (ref 70–99)
Potassium: 3.4 mEq/L — ABNORMAL LOW (ref 3.7–5.3)
Sodium: 140 mEq/L (ref 137–147)

## 2013-03-18 LAB — GLUCOSE, CAPILLARY
GLUCOSE-CAPILLARY: 238 mg/dL — AB (ref 70–99)
Glucose-Capillary: 108 mg/dL — ABNORMAL HIGH (ref 70–99)
Glucose-Capillary: 155 mg/dL — ABNORMAL HIGH (ref 70–99)
Glucose-Capillary: 166 mg/dL — ABNORMAL HIGH (ref 70–99)

## 2013-03-18 MED ORDER — ALBUTEROL SULFATE (2.5 MG/3ML) 0.083% IN NEBU
5.0000 mg | INHALATION_SOLUTION | Freq: Once | RESPIRATORY_TRACT | Status: AC
  Administered 2013-03-18: 5 mg via RESPIRATORY_TRACT
  Filled 2013-03-18: qty 6

## 2013-03-18 MED ORDER — POTASSIUM CHLORIDE 10 MEQ/100ML IV SOLN
10.0000 meq | Freq: Once | INTRAVENOUS | Status: AC
  Administered 2013-03-18: 10 meq via INTRAVENOUS
  Filled 2013-03-18: qty 100

## 2013-03-18 MED ORDER — FUROSEMIDE 10 MG/ML IJ SOLN
INTRAMUSCULAR | Status: AC
  Filled 2013-03-18: qty 8

## 2013-03-18 MED ORDER — ALBUTEROL SULFATE (2.5 MG/3ML) 0.083% IN NEBU
INHALATION_SOLUTION | RESPIRATORY_TRACT | Status: AC
  Filled 2013-03-18: qty 3

## 2013-03-18 MED ORDER — FUROSEMIDE 10 MG/ML IJ SOLN
60.0000 mg | Freq: Two times a day (BID) | INTRAMUSCULAR | Status: DC
  Administered 2013-03-18 – 2013-03-19 (×4): 60 mg via INTRAVENOUS
  Filled 2013-03-18 (×4): qty 6

## 2013-03-18 MED ORDER — POTASSIUM CHLORIDE CRYS ER 20 MEQ PO TBCR
40.0000 meq | EXTENDED_RELEASE_TABLET | Freq: Two times a day (BID) | ORAL | Status: AC
  Administered 2013-03-18 – 2013-03-22 (×10): 40 meq via ORAL
  Filled 2013-03-18 (×11): qty 2

## 2013-03-18 NOTE — Progress Notes (Signed)
Subjective:  Patient denies any chest pain states breathing is improved after IV Lasix had significant urinary retention requiring Foley catheter insertion  Objective:  Vital Signs in the last 24 hours: Temp:  [97.5 F (36.4 C)-99 F (37.2 C)] 98.8 F (37.1 C) (02/24 0720) Pulse Rate:  [73-97] 78 (02/24 0800) Resp:  [18-28] 19 (02/24 0800) BP: (105-190)/(16-96) 146/77 mmHg (02/24 0800) SpO2:  [83 %-99 %] 83 % (02/24 0800) Weight:  [73.2 kg (161 lb 6 oz)] 73.2 kg (161 lb 6 oz) (02/24 0347)  Intake/Output from previous day: 02/23 0701 - 02/24 0700 In: 1245 [P.O.:120; I.V.:525; IV Piggyback:600] Out: 1350 [Urine:1350] Intake/Output from this shift:    Physical Exam: Neck: no adenopathy, no carotid bruit, no JVD and supple, symmetrical, trachea midline Lungs: Decreased breath sound at bases with occasional rhonchi and rales Heart: regular rate and rhythm, S1, S2 normal and Soft systolic murmur noted Abdomen: soft, non-tender; bowel sounds normal; no masses,  no organomegaly Extremities: No clubbing cyanosis trace edema noted  Lab Results:  Recent Labs  03/17/13 1735 03/18/13 0439  WBC 9.9 7.1  HGB 8.1* 8.5*  PLT 212 210    Recent Labs  03/17/13 1735 03/18/13 0439  NA 140 140  K 3.5* 3.4*  CL 103 103  CO2 27 26  GLUCOSE 133* 115*  BUN 25* 20  CREATININE 0.95 0.88   No results found for this basename: TROPONINI, CK, MB,  in the last 72 hours Hepatic Function Panel  Recent Labs  03/17/13 1735  PROT 7.2  ALBUMIN 2.0*  AST 64*  ALT 61*  ALKPHOS 58  BILITOT 0.5   No results found for this basename: CHOL,  in the last 72 hours No results found for this basename: PROTIME,  in the last 72 hours  Imaging: Imaging results have been reviewed and Dg Chest Port 1 View  03/18/2013   CLINICAL DATA:  Seizure. Encephalopathy and seizure. Uncontrolled hypertension. Hypoxia.  EXAM: PORTABLE CHEST - 1 VIEW  COMPARISON:  Single view of the chest 03/17/2013 and 03/15/2013.   FINDINGS: Left IJ catheter remains in place. Bilateral airspace seen on yesterday's examination has markedly worsened. There are likely small bilateral pleural effusions. Cardiomegaly is noted.  IMPRESSION: Marked worsening bilateral airspace disease could be due to progressive edema or pneumonia.   Electronically Signed   By: Inge Rise M.D.   On: 03/18/2013 07:44   Dg Chest Port 1 View  03/17/2013   CLINICAL DATA:  58 year old male with acute respiratory failure. Cardiac arrest. Initial encounter.  EXAM: PORTABLE CHEST - 1 VIEW  COMPARISON:  03/15/2013 and earlier.  FINDINGS: Portable AP upright view at 0736 hr. Stable left IJ central line. Stable cardiac size and mediastinal contours. No pneumothorax. No pleural effusion identified. Decreased widespread indistinct pulmonary opacity, residual now mostly in both upper lobes. No areas of worsening ventilation.  IMPRESSION: Regressed bilateral indistinct pulmonary opacity, favor resolving pulmonary edema. Residual in both upper lungs.   Electronically Signed   By: Lars Pinks M.D.   On: 03/17/2013 08:09   Dg Swallowing Func-speech Pathology  03/16/2013   Marcille Buffy, [place]     03/16/2013  9:23 PM Objective Swallowing Evaluation:    Patient Details  Name: Eric [name] MRN: [id number] Date of Birth: 05-21-1955  Today's Date: 03/16/2013 Time: 1630-1700 SLP Time Calculation (min): 30 min  Past Medical History:  Past Medical History  Diagnosis Date  . Hypertension   . Hyperlipemia   . 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 Edema    Past Surgical History:  Past Surgical History  Procedure Laterality Date  . Coronary artery bypass graft     HPI:  58 y/o male with PMH for CAD, CABG, DM, HTN admitted to ED  following witnessed seizure at [employer].  Apneic and  pulseless at scene.  CPR x5 minutes, king airway placed by EMS,  intubated in ED, recurrent V fib arrest in ED required  defibrillation.  CT indicates hydrocephalus with  diffuse dilation  to the lateral and third ventricles.  Intubated 2/13 to 2/18.   Postextubation dysphagia suspected following extubation.  BSE  completed on 2/219 with recommendations of NPO.  F/u on 2/20with  recommendations to continue NPO with exception of medication  crushed in puree.  F/u on 2/21 indicates improvement in overall  swallow ability to but continued with reduced ability to protect  airway fully.  Early am on 2/22 desat to low 80's with increased  confusion requiring Bi-Pap.   Per nursing patient maintaining  oxygen saturations with nasal cannula with improved LOA.  MBS  indicated to objectively assess for aspiration and determine  safest, PO diet as patient continues with NPO status.    Assessment / Plan / Recommendation Clinical Impression  Dysphagia Diagnosis: Mild oral phase dysphagia;Mild pharyngeal  phase dysphagia;Mild cervical esophageal phase dysphagia Mild to moderate pharyngeal dysphagia marked mainly by what  appears to laryngeal edema affecting bolus transit.  All swallows  triggered at valleculae space.  Bolus contained at level of C6  prior to completed transit.  Appeared to be artifact at C-6  radiologist present unable to confirm.  Moderate amount of  residuals at level of pyriforms requiring multiple swallows with  use of chin tuck to clear.  Patient did not sense containment.     Due to narrowing of pharynx from what appeared to be edema trials  of soft and regular solids and whole barium tablet deferred.   Brief esophageal screen indicates slow bolus transit with  backflow noted to cervical esophagus with puree and thin liquid  trials.  No penetration or aspiration events occurred.  Recommend  to initiate dysphagia 1 (puree)  diet consistency and thin liquid  with full supervision with all PO's due to noted cognitive  deficits.  Recommend strict aspiration precautions and use of  swallow strategies as aspiration risk remains high s/p swallow.   Diagnostic treatment completed  following study focusing on diet  recommendations and necessary swallow strategies to nursing.  ST  to follow closely in acute care  for diet tolerance.  Recommend  repeat MBS with clinical improvement prior to diet upgrade.      Treatment Recommendation       Diet Recommendation Dysphagia 1 (Puree);No mixed  consistencies;Thin liquid   Liquid Administration via: Cup;Straw Medication Administration: Crushed with puree Supervision: Full supervision/cueing for compensatory strategies Compensations: Multiple dry swallows after each  bite/sip;Effortful swallow Postural Changes and/or Swallow Maneuvers: Seated upright 90  degrees;Upright 30-60 min after meal;Chin tuck    Other  Recommendations Oral Care Recommendations: Oral care Q4  per protocol   Follow Up Recommendations  Inpatient Rehab    Frequency and Duration min 2x/week  2 weeks   Pertinent Vitals/Pain Oxygen saturations lower 90's on 6 L RR   Lower 20's    SLP Swallow Goals     General Date of Onset: 03/07/13 HPI: 58 y/o male with PMH for CAD, CABG, DM, HTN admitted to ED  following witnessed  seizure at [employer].  Apneic and  pulseless at scene.  CPR x5 minutes, king airway placed by EMS,  intubated in ED, recurrent V fib aresst in ED required  defibrillation.  CT indicates hydrocephalus with diffuse dilation  to the lateral and third verntricles.  Intubated 2/13 to 2/18.   Postextubation dysphagia suspected following extubation.  BSE  completed on 2/20 with recommendations of NPO.  F/u on 2/21 with  recommendations to continue NPO with exception of medication  crushed in puree.  Improvement noted in swallow ability on 2/21     Reason for Referral     Oral Phase Oral Preparation/Oral Phase Oral Phase: Impaired Oral - Thin Oral - Thin Teaspoon: Piecemeal swallowing;Holding of  bolus;Reduced posterior propulsion Oral - Thin Cup: Piecemeal swallowing;Holding of bolus;Reduced  posterior propulsion Oral - Thin Straw: Reduced posterior propulsion Oral - Solids  Oral - Puree: Piecemeal swallowing;Reduced posterior propulsion Oral - Mechanical Soft: Not tested   Pharyngeal Phase Pharyngeal Phase Pharyngeal Phase: Impaired Pharyngeal - Nectar Pharyngeal - Nectar Teaspoon: Reduced pharyngeal  peristalsis;Reduced anterior laryngeal mobility;Reduced laryngeal  elevation;Reduced airway/laryngeal closure;Pharyngeal residue -  pyriform sinuses Pharyngeal - Thin Pharyngeal - Thin Teaspoon: Reduced pharyngeal  peristalsis;Reduced tongue base retraction;Reduced anterior  laryngeal mobility;Reduced laryngeal elevation;Reduced  airway/laryngeal closure;Pharyngeal residue - pyriform sinuses Pharyngeal - Thin Cup: Reduced pharyngeal peristalsis;Reduced  tongue base retraction;Reduced anterior laryngeal  mobility;Reduced laryngeal elevation;Reduced airway/laryngeal  closure;Pharyngeal residue - pyriform sinuses Pharyngeal - Thin Straw: Reduced pharyngeal peristalsis;Reduced  anterior laryngeal mobility;Reduced laryngeal elevation;Reduced  airway/laryngeal closure;Pharyngeal residue - pyriform sinuses Pharyngeal - Solids Pharyngeal - Puree: Reduced pharyngeal peristalsis;Reduced tongue  base retraction;Reduced anterior laryngeal mobility;Reduced  laryngeal elevation;Reduced airway/laryngeal closure;Pharyngeal  residue - pyriform sinuses;Pharyngeal residue - cp segment  Cervical Esophageal Phase    GO    Cervical Esophageal Phase Cervical Esophageal Phase: Impaired Cervical Esophageal Phase - Thin Thin Straw: Prominent cricopharyngeal segment;Esophageal backflow  into cervical esophagus Cervical Esophageal Phase - Solids Puree: Prominent cricopharyngeal segment;Esophageal backflow into  cervical esophagus        Sharman Crate MS, CCC-SLP [phone] [facility] 03/16/2013, 8:57 PM     Cardiac Studies:  Assessment/Plan:  Status post recurrent V. fib cardiac arrest  Status post Vent dependent respiratory failure secondary to above rule out aspiration /ARDS  Coronary artery disease status  post CABG STATUS post left cath small vessel disease  Uncontrolled Hypertension  Diabetes mellitus  Hypercholesteremia  Status post seizures  Mild volume overload Urinary retention Anemia questionable etiology Plan Agree with present management Consider Flomax   LOS: 11 days    Kamarius Buckbee N 03/18/2013, 10:15 AM

## 2013-03-18 NOTE — Progress Notes (Signed)
Eric [name] - Stepdown/ICU TEAM Progress Note  Eric [name] [id number] DOB: 1956-01-22 DOA: 03/07/2013 PCP: No primary provider on file.  Admit HPI / Brief Narrative: 58 yo with witnessed convulstions at a [employer]. Apneic and pulseless at scene. CPR x 5 minutes , king airway placed by EMS, intubated in ED. Receives his care in [place]. 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   SIGNIFICANT EVENTS / STUDIES:  2/13 CT head: Hydrocephalus, with diffuse dilatation of the lateral and third ventricles.  2/13 Cards consult Terrence Dupont) and LHC: Cardiac cath with severe native vessel coronary artery disease but patent SVGs and LIMA  2/13 Neuro Consult 2/14 EEG: discontinuous slow background - no epileptiform activity noted 2/14 Amiodarone discontinued due to sinus bradycardia  03/09/13: Rewarming started - Episode of VT  ETT 2/13 >>2/18  HPI/Subjective: Pt alert this am. Family at bedside and assisting pt with breakfast tray. Pt asking to be allowed to get OOB.  Assessment/Plan:  Recurrent VF arrest -likely the primary event leading to his collapse prior to presentation - Cardiology following - EP planning ICD insertion over the next few days - EF on TTE 2/20 is normal at 55-60% w/ no WMA-amiodarone dc'd  VDRF post arrest -Extubated 2/18 - no evidence of primary pulmonary issues   Sinus bradycardia on amiodarone  -Resolved w/ d/c of amio   Encephalopathy  -secondary to his anoxic event possibly coupled with ICU delirium - appears to worsen each night c/w sundowning - cont scheduled seroquel and follow   Uncontrolled Hypertension  -Recent issues with increased BP with agitation leading to flash pulm edema - better on PO meds and IV NTG - will increase BB - ACE I already at max dose - will add Imdur and wean/dc IV NTG  Acute respiratory failure with Hypoxia due to residual flash pulmonary edema -Still requiring oxygen but has weaned - [facility] 2/22 c/w edema -  rpt CXR 2/24 worse - (has preserved LV fnx) --will give Lasix IV q 12 hrs for 3 doses  Hypernatremia  -resolved  Hypokalemia -replete  Anemia of critical illness  -Keep Hgb 8.0 or > - stable at present  Mild thrombocytopenia -Resolved  Klebsiella in sputum -Remains on unasyn - afeb - WBC normal - complete 7 days of tx   DM2 -Reasonably controlled at present - follow trend   Hydrocephalus -Appears congenital on MRI - no further interventions at this time  Seizures -Not clear if seizure > arrest vs arrest > convulsive syncope or seizure - significant doubt that this represents seizure as a primary event - not on AED - EEG w/o epileptiform activity   Nutrition  -Currently tolerating diet w/o difficulty  Code Status: FULL Family Communication: no family at bedside Disposition Plan: SDU  Consultants: PCCM Cardiology Neurology  EP  Antibiotics: Vancomycin 2/17>>>2/19  Zosyn 2/17>>>2/19  Unasyn 2/19>>>  DVT prophylaxis: SQ heparin   Objective: Blood pressure 128/69, pulse 79, temperature 97.9 F (36.6 C), temperature source Oral, resp. rate 22, height 6' ([name].829 m), weight 161 lb 6 oz (73.2 kg), SpO2 97.00%.  Intake/Output Summary (Last 24 hours) at 03/18/13 1333 Last data filed at 03/18/13 1200  Gross per 24 hour  Intake   1020 ml  Output   2950 ml  Net  -1930 ml   Exam: General: No acute respiratory distress at rest -confused Lungs: bilateral crackles posteriorly, 5 L Cardiovascular: Regular rate and rhythm without murmur gallop or rub  Abdomen: Nontender, nondistended, soft, bowel  sounds positive, no rebound, no ascites, no appreciable mass Extremities: No significant cyanosis, clubbing;  No edema bilateral lower extremities  Data Reviewed: Basic Metabolic Panel:  Recent Labs Lab 03/12/13 0410 03/13/13 0345 03/14/13 0325 03/15/13 0405 03/16/13 0405 03/17/13 0400 03/17/13 1735 03/18/13 0439  NA 141 144 148* 152* 149* 148* 140 140  K 3.9 3.8 4.[name]  4.0 3.6* 3.0* 3.5* 3.4*  CL 108 105 111 113* 110 109 103 103  CO2 20 23 23 25 28 29 27 26   GLUCOSE 195* 138* 155* 174* 172* 104* 133* 115*  BUN 28* 25* 24* 25* 28* 26* 25* 20  CREATININE [name].05 [name].08 0.98 0.88 0.96 [name].02 0.95 0.88  CALCIUM 8.[name]* 8.2* 8.7 8.9 8.8 8.4 8.0* 8.[name]*  MG 2.4 2.[name] 2.2 2.5  --   --  2.[name]  --   PHOS 2.4 3.[name] 3.5 3.8  --   --   --   --    Liver Function Tests:  Recent Labs Lab 03/17/13 1735  AST 64*  ALT 61*  ALKPHOS 58  BILITOT 0.5  PROT 7.2  ALBUMIN 2.0*    CBC:  Recent Labs Lab 03/12/13 0410 03/13/13 0345 03/14/13 0325 03/15/13 0405 03/16/13 0405 03/17/13 1735 03/18/13 0439  WBC 5.4 6.7 7.8 8.9 12.8* 9.9 7.[name]  NEUTROABS 4.[name] 4.6 5.5  --   --   --   --   HGB 9.[name]* 9.0* 9.2* 9.2* 9.5* 8.[name]* 8.5*  HCT 26.2* 27.3* 27.3* 28.[name]* 28.3* 24.0* 24.5*  MCV 90.7 91.6 91.6 93.4 94.0 92.0 91.4  PLT 130* 161 197 206 236 212 210   Cardiac Enzymes: No results found for this basename: CKTOTAL, CKMB, CKMBINDEX, TROPONINI,  in the last 168 hours BNP (last 3 results)  Recent Labs  03/11/13 0340  PROBNP 370.5*   CBG:  Recent Labs Lab 03/17/13 1232 03/17/13 1707 03/17/13 2125 03/18/13 0722 03/18/13 1151  GLUCAP 177* 123* 145* 108* 238*    Recent Results (from the past 240 hour(s))  URINE CULTURE     Status: None   Collection Time    03/10/13 11:13 AM      Result Value Ref Range Status   Specimen Description URINE, CATHETERIZED   Final   Special Requests NONE   Final   Culture  Setup Time     Final   Value: 03/10/2013 17:12     Performed at [place]     Final   Value: 75,000 COLONIES/ML     Performed at [facility]   Culture     Final   Value: STAPHYLOCOCCUS SPECIES (COAGULASE NEGATIVE)     Note: RIFAMPIN AND GENTAMICIN SHOULD NOT BE USED AS SINGLE DRUGS FOR TREATMENT OF STAPH INFECTIONS.     Performed at [facility]   Report Status 03/12/2013 FINAL   Final   Organism ID, Bacteria STAPHYLOCOCCUS SPECIES  (COAGULASE NEGATIVE)   Final  CULTURE, RESPIRATORY (NON-EXPECTORATED)     Status: None   Collection Time    03/10/13 11:30 AM      Result Value Ref Range Status   Specimen Description TRACHEAL ASPIRATE   Final   Special Requests NONE   Final   Gram Stain     Final   Value: ABUNDANT WBC PRESENT, PREDOMINANTLY PMN     RARE SQUAMOUS EPITHELIAL CELLS PRESENT     ABUNDANT GRAM VARIABLE ROD     FEW GRAM POSITIVE COCCI     IN CLUSTERS   Culture  Final   Value: MODERATE KLEBSIELLA OXYTOCA     MODERATE KLEBSIELLA PNEUMONIAE     Performed at [facility]   Report Status 03/17/2013 FINAL   Final   Organism ID, Bacteria KLEBSIELLA OXYTOCA   Final   Organism ID, Bacteria KLEBSIELLA PNEUMONIAE   Final  CULTURE, BLOOD (ROUTINE X 2)     Status: None   Collection Time    03/10/13 11:42 AM      Result Value Ref Range Status   Specimen Description BLOOD LEFT ANTECUBITAL   Final   Special Requests BOTTLES DRAWN AEROBIC AND ANAEROBIC 10CC   Final   Culture  Setup Time     Final   Value: 03/10/2013 16:16     Performed at [facility]   Culture     Final   Value: NO GROWTH 5 DAYS     Performed at [facility]   Report Status 03/16/2013 FINAL   Final  CULTURE, BLOOD (ROUTINE X 2)     Status: None   Collection Time    03/10/13 11:50 AM      Result Value Ref Range Status   Specimen Description BLOOD LEFT HAND   Final   Special Requests BOTTLES DRAWN AEROBIC ONLY 10CC   Final   Culture  Setup Time     Final   Value: 03/10/2013 16:16     Performed at [facility]   Culture     Final   Value: NO GROWTH 5 DAYS     Performed at [facility]   Report Status 03/16/2013 FINAL   Final     Studies:  Recent x-ray studies have been reviewed in detail by the Attending Physician  Scheduled Meds:  Scheduled Meds: . albuterol      . amLODipine  10 mg Oral Daily  . ampicillin-sulbactam (UNASYN) IV  3 g Intravenous Q6H  . antiseptic oral rinse  15 mL Mouth  Rinse BID  . aspirin  81 mg Oral Daily  . atorvastatin  40 mg Oral q1800  . furosemide      . furosemide  60 mg Intravenous Q12H  . heparin subcutaneous  5,000 Units Subcutaneous 3 times per day  . insulin aspart  0-9 Units Subcutaneous TID WC  . isosorbide mononitrate  60 mg Oral Daily  . lisinopril  20 mg Oral BID  . metoprolol tartrate  75 mg Oral TID  . potassium chloride  40 mEq Oral BID  . QUEtiapine  12.5 mg Oral QHS  . spironolactone  25 mg Oral Daily    Time spent on care of this patient: 35 mins   ELLIS,ALLISON L. ANP  [facility] Office  [phone] Pager - Text Page per Shea Evans as per below:  On-Call/Text Page:      [email or web address]      password [email or web address]  If 7PM-7AM, please contact night-coverage [email or web address] Password [email or web address] 03/18/2013, [name]:33 PM   LOS: 11 days      I have examined the patient, reviewed the chart and modified the above note which I agree with.   Kimela Malstrom,MD [id number] 03/18/2013, 5:36 PM

## 2013-03-18 NOTE — Progress Notes (Signed)
Physical Therapy Treatment Patient Details Name: Eric [name] MRN: [id number] DOB: [birth year] Today's Date: 03/18/2013 Time: [id number] PT Time Calculation (min): 24 min  PT Assessment / Plan / Recommendation  History of Present Illness 58 yo with witnessed sz at [employer]. Apneic and pulseless at scene. CPR x 5 minutes, king airway placed by EMS, intubated in ED. 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 ETT 2/13-2/18   PT Comments   Pt admitted with above. Pt currently with functional limitations due to balance and endurance deficits. Ambulating slightly better today with improved postural stability. Pt will benefit from skilled PT to increase their independence and safety with mobility to allow discharge to the venue listed below.   Follow Up Recommendations  CIR;Supervision/Assistance - 24 hour     Does the patient have the potential to tolerate intense rehabilitation     Barriers to Discharge        Equipment Recommendations  Rolling walker with 5" wheels    Recommendations for Other Services Rehab consult;OT consult  Frequency Min 3X/week   Progress towards PT Goals Progress towards PT goals: Progressing toward goals  Plan Current plan remains appropriate    Precautions / Restrictions Precautions Precautions: Fall Restrictions Weight Bearing Restrictions: No   Pertinent Vitals/Pain VSS, no pain    Mobility  Transfers Overall transfer level: Needs assistance Equipment used: Rolling walker (2 wheeled) Transfers: Sit to/from Stand Sit to Stand: Mod assist;+2 physical assistance General transfer comment: pt with max cues for safety, hand placement and sequence with transfers as pt impulsive trying to stand and move despite poor balance. Needs incr time to obtain standing balance with fairly large posterior lean.   Ambulation/Gait Ambulation/Gait assistance: Mod assist Ambulation Distance (Feet): 175 Feet Assistive device: Rolling walker (2  wheeled) Gait Pattern/deviations: Step-to pattern;Decreased stride length Gait velocity interpretation: Below normal speed for age/gender General Gait Details: pt with slight posterior lean with improving balance. Min assist to direct and advance RW and assist for balance and sequence throughout with cues for safety. Pt needed intermittent cues for anterior pelvic tilt tactilly and verbally as well as cues to stand upright.  Pt with overall fair postural stability.  Lots of cues and assist needed for safety.  Followed pt with chair as well but did not need it.      Exercises General Exercises - Lower Extremity Ankle Circles/Pumps: AROM;Both;5 reps;Seated Long Arc Quad: AROM;Both;10 reps;Seated Heel Slides: AROM;Both;10 reps;Supine Hip Flexion/Marching: AROM;Both;10 reps;Seated   PT Diagnosis:    PT Problem List:   PT Treatment Interventions:     PT Goals (current goals can now be found in the care plan section)    Visit Information  Last PT Received On: 03/18/13 Assistance Needed: +2 History of Present Illness: 58 yo with witnessed sz at [employer]. Apneic and pulseless at scene. CPR x 5 minutes, king airway placed by EMS, intubated in ED. 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 ETT 2/13-2/18    Subjective Data  Subjective: " I want to walk."   Cognition  Cognition Arousal/Alertness: Awake/alert Behavior During Therapy: Flat affect;Impulsive Overall Cognitive Status: Impaired/Different from baseline Area of Impairment: Memory;Problem solving;Safety/judgement Memory: Decreased short-term memory Safety/Judgement: Decreased awareness of deficits;Decreased awareness of safety Problem Solving: Difficulty sequencing;Slow processing;Requires verbal cues;Requires tactile cues General Comments: Somewhat improved with a little less impulsivity and decr confusion.     Balance  Balance Postural control: Posterior lean Standing balance support: Bilateral upper extremity  supported;During functional activity Standing balance-Leahy Scale:  Fair Standing balance comment: Continues to need RW for support in standing.   End of Session PT - End of Session Equipment Utilized During Treatment: Gait belt;Oxygen Activity Tolerance: Patient tolerated treatment well Patient left: in chair;with call bell/phone within reach;with chair alarm set Nurse Communication: Mobility status   GP     INGOLD,Geoffery Aultman 03/18/2013, 1:20 PM [facility] Acute Rehabilitation [phone] (pager)

## 2013-03-18 NOTE — Plan of Care (Signed)
CXR from this am reviewed. Note Kerley B lines bilaterally c/e edema- still on 5 L oxygen-will give 3 doses of Lasix 60 mg IV with KCL and follow clinically. I also neglected to mention in yesterday's note that he has had recurrent urinary retention > 700 cc- foley inserted overnight  Erin Hearing, ANP

## 2013-03-18 NOTE — Progress Notes (Signed)
NUTRITION FOLLOW UP  Intervention:   1.  General healthful diet; encourage intake of foods and beverages as able.  RD to follow and assess for nutritional adequacy.   Nutrition Dx:   Inadequate oral intake, ongoing  Goal:   Pt to meet >/=90% estimated nutrition needs; ongoing  Monitor:   Weight trends, labs, I/O's, SLP recommendations, if TF started-TF rate and tolerance  Assessment:   PMHx significant for HTN, HLD, DM. Admitted s/p witness seizure and cardiac arrest at [employer], required CPR x 5 minutes, pt was intubated in ED and coded again while there. Pt went to cath lab urgently on admission. Findings reveal severe native vessl CAD.   2/17-Patient is currently intubated on ventilator support. Discussed with RN who states attempting to wean from propofol, however pt to remain intubated throughout 2/17 so propofol may be variable.  2/19-Pt extubated 2/18 currently on nasal cannula. Discussed with RN who had an order to place a tube and has been NPO x24 hrs. Pt failed swallow test with severe aspiration risk per SLP. SLP to repeat evaluation test tomorrow morning and may recommend objective evaluation if indicated which would require removal of NGT. RD recommends holding off on tube placement for another 24 hours, if tube was solely to be used for nutrition purposes.  If tube placement is warranted for PO meds, orders will be placed so that nutrition can be provided as well.   Pt without s/s of inadequate nutrition PTA, and was maintained on tube feeding prior to extubation. CBGs have been elevated.   2/24- Pt tolerating diet. Consumed 100% of breakfast this AM.  RD will continue to follow for adequacy of intake.   Height: Ht Readings from Last 1 Encounters:  03/07/13 6' (1.829 m)    Weight Status:   Wt Readings from Last 1 Encounters:  03/18/13 161 lb 6 oz (73.2 kg)  03/11/13 167 lb  Body mass index is 21.88 kg/(m^2).  Re-estimated needs:  Kcal: 2000-2200 Protein:  95-110 grams  Fluid: 2 L-2.2 L/day  Skin: intact  Diet Order: Dysphagia 2, thin   Intake/Output Summary (Last 24 hours) at 03/18/13 1121 Last data filed at 03/18/13 1000  Gross per 24 hour  Intake 1126.75 ml  Output   1550 ml  Net -423.25 ml    Last BM: 2/24  Labs:   Recent Labs Lab 03/13/13 0345 03/14/13 0325 03/15/13 0405  03/17/13 0400 03/17/13 1735 03/18/13 0439  NA 144 148* 152*  < > 148* 140 140  K 3.8 4.1 4.0  < > 3.0* 3.5* 3.4*  CL 105 111 113*  < > 109 103 103  CO2 23 23 25   < > 29 27 26   BUN 25* 24* 25*  < > 26* 25* 20  CREATININE 1.08 0.98 0.88  < > 1.02 0.95 0.88  CALCIUM 8.2* 8.7 8.9  < > 8.4 8.0* 8.1*  MG 2.1 2.2 2.5  --   --  2.1  --   PHOS 3.1 3.5 3.8  --   --   --   --   GLUCOSE 138* 155* 174*  < > 104* 133* 115*  < > = values in this interval not displayed.  CBG (last 3)   Recent Labs  03/17/13 1707 03/17/13 2125 03/18/13 0722  GLUCAP 123* 145* 108*    Scheduled Meds: . albuterol      . amLODipine  10 mg Oral Daily  . ampicillin-sulbactam (UNASYN) IV  3 g Intravenous Q6H  .  antiseptic oral rinse  15 mL Mouth Rinse BID  . aspirin  81 mg Oral Daily  . atorvastatin  40 mg Oral q1800  . furosemide      . furosemide  60 mg Intravenous Q12H  . heparin subcutaneous  5,000 Units Subcutaneous 3 times per day  . insulin aspart  0-9 Units Subcutaneous TID WC  . isosorbide mononitrate  60 mg Oral Daily  . lisinopril  20 mg Oral BID  . metoprolol tartrate  75 mg Oral TID  . potassium chloride  40 mEq Oral BID  . QUEtiapine  12.5 mg Oral QHS  . spironolactone  25 mg Oral Daily    Continuous Infusions: . sodium chloride 10 mL/hr at 03/17/13 1500    Brynda Greathouse, MS RD LDN Clinical Inpatient Dietitian Pager: [phone] Weekend/After hours pager: [phone]

## 2013-03-19 LAB — CBC
HCT: 24 % — ABNORMAL LOW (ref 39.0–52.0)
Hemoglobin: 8.1 g/dL — ABNORMAL LOW (ref 13.0–17.0)
MCH: 30.9 pg (ref 26.0–34.0)
MCHC: 33.8 g/dL (ref 30.0–36.0)
MCV: 91.6 fL (ref 78.0–100.0)
Platelets: 212 10*3/uL (ref 150–400)
RBC: 2.62 MIL/uL — AB (ref 4.22–5.81)
RDW: 13.3 % (ref 11.5–15.5)
WBC: 7.2 10*3/uL (ref 4.0–10.5)

## 2013-03-19 LAB — BASIC METABOLIC PANEL
BUN: 22 mg/dL (ref 6–23)
CALCIUM: 8.1 mg/dL — AB (ref 8.4–10.5)
CO2: 28 meq/L (ref 19–32)
Chloride: 102 mEq/L (ref 96–112)
Creatinine, Ser: 0.92 mg/dL (ref 0.50–1.35)
GFR calc Af Amer: >90 mL/min (ref >90)
GFR calc non Af Amer: >90 mL/min (ref >90)
GLUCOSE: 164 mg/dL — AB (ref 70–99)
Potassium: 3.7 mEq/L (ref 3.7–5.3)
SODIUM: 138 meq/L (ref 137–147)

## 2013-03-19 LAB — GLUCOSE, CAPILLARY
GLUCOSE-CAPILLARY: 144 mg/dL — AB (ref 70–99)
GLUCOSE-CAPILLARY: 164 mg/dL — AB (ref 70–99)
Glucose-Capillary: 142 mg/dL — ABNORMAL HIGH (ref 70–99)
Glucose-Capillary: 124 mg/dL — ABNORMAL HIGH (ref 70–99)

## 2013-03-19 MED ORDER — SODIUM CHLORIDE 0.9 % IJ SOLN
10.0000 mL | INTRAMUSCULAR | Status: DC | PRN
  Administered 2013-03-19 – 2013-03-20 (×7): 10 mL
  Administered 2013-03-21 – 2013-03-23 (×4): 30 mL

## 2013-03-19 MED ORDER — MENTHOL 3 MG MT LOZG
1.0000 | LOZENGE | OROMUCOSAL | Status: DC | PRN
  Administered 2013-03-19: 3 mg via ORAL
  Filled 2013-03-19: qty 9

## 2013-03-19 MED ORDER — HALOPERIDOL LACTATE 5 MG/ML IJ SOLN: 5.0000 mg | Freq: Four times a day (QID) | INTRAMUSCULAR | Status: DC | PRN

## 2013-03-19 MED ORDER — FUROSEMIDE 10 MG/ML IJ SOLN
INTRAMUSCULAR | Status: AC
  Administered 2013-03-19: 60 mg via INTRAVENOUS
  Filled 2013-03-19: qty 8

## 2013-03-19 NOTE — Progress Notes (Signed)
Subjective:  Denies any chest pain or shortness of breath. No further V. tach or V. fib  Objective:  Vital Signs in the last 24 hours: Temp:  [97.4 F (36.3 C)-99.3 F (37.4 C)] 98.7 F (37.1 C) (02/25 0736) Pulse Rate:  [76-81] 81 (02/25 0900) Resp:  [0-25] 22 (02/25 0900) BP: (114-160)/(62-84) 159/84 mmHg (02/25 0736) SpO2:  [96 %-100 %] 100 % (02/25 0900) Weight:  [76.9 kg (169 lb 8.5 oz)] 76.9 kg (169 lb 8.5 oz) (02/25 0332)  Intake/Output from previous day: 02/24 0701 - 02/25 0700 In: 1260 [P.O.:840; I.V.:20; IV Piggyback:400] Out: 3700 [Urine:3700] Intake/Output from this shift: Total I/O In: -  Out: 325 [Urine:325]  Physical Exam: Neck: no adenopathy, no carotid bruit, no JVD and supple, symmetrical, trachea midline Lungs: Decreased breath sound at bases air entry improved Heart: regular rate and rhythm, S1, S2 normal and Soft systolic murmur noted Abdomen: soft, non-tender; bowel sounds normal; no masses,  no organomegaly Extremities: No clubbing cyanosis trace edema noted  Lab Results:  Recent Labs  03/18/13 0439 03/19/13 0421  WBC 7.1 7.2  HGB 8.5* 8.1*  PLT 210 212    Recent Labs  03/18/13 0439 03/19/13 0421  NA 140 138  K 3.4* 3.7  CL 103 102  CO2 26 28  GLUCOSE 115* 164*  BUN 20 22  CREATININE 0.88 0.92   No results found for this basename: TROPONINI, CK, MB,  in the last 72 hours Hepatic Function Panel  Recent Labs  03/17/13 1735  PROT 7.2  ALBUMIN 2.0*  AST 64*  ALT 61*  ALKPHOS 58  BILITOT 0.5   No results found for this basename: CHOL,  in the last 72 hours No results found for this basename: PROTIME,  in the last 72 hours  Imaging: Imaging results have been reviewed and Dg Chest Port 1 View  03/18/2013   CLINICAL DATA:  Seizure. Encephalopathy and seizure. Uncontrolled hypertension. Hypoxia.  EXAM: PORTABLE CHEST - 1 VIEW  COMPARISON:  Single view of the chest 03/17/2013 and 03/15/2013.  FINDINGS: Left IJ catheter remains in  place. Bilateral airspace seen on yesterday's examination has markedly worsened. There are likely small bilateral pleural effusions. Cardiomegaly is noted.  IMPRESSION: Marked worsening bilateral airspace disease could be due to progressive edema or pneumonia.   Electronically Signed   By: Inge Rise M.D.   On: 03/18/2013 07:44    Cardiac Studies:  Assessment/Plan:  Status post recurrent V. fib cardiac arrest  Status post Vent dependent respiratory failure secondary to above rule out aspiration /ARDS resolved Coronary artery disease status post CABG STATUS post left cath small vessel disease  Uncontrolled Hypertension  Diabetes mellitus  Hypercholesteremia  Status post seizures  Mild volume overload  Urinary retention  Anemia questionable etiology  Plan Continue present management Okay to transfer to telemetry I will follow when necessary  Hopefully will be scheduled for ICD end of the week or early next week   LOS: 12 days    Eric [name] N 03/19/2013, 9:43 AM

## 2013-03-19 NOTE — Plan of Care (Signed)
Still requiring oxygen at 4 L- will continue Lasix for now.  Erin Hearing, ANP

## 2013-03-19 NOTE — Progress Notes (Signed)
[place] TEAM 1 - Stepdown/ICU TEAM Progress Note  Eric [name] [id number] DOB: [birth year] DOA: 03/07/2013 PCP: No primary provider on file.  Admit HPI / Brief Narrative: 58 yo with witnessed convulstions at a [employer]. Apneic and pulseless at scene. CPR x 5 minutes , king airway placed by EMS, intubated in ED. Receives his care in [place]. 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   SIGNIFICANT EVENTS / STUDIES:  2/13 CT head: Hydrocephalus, with diffuse dilatation of the lateral and third ventricles.  2/13 Cards consult Terrence Dupont) and LHC: Cardiac cath with severe native vessel coronary artery disease but patent SVGs and LIMA  2/13 Neuro Consult 2/14 EEG: discontinuous slow background - no epileptiform activity noted 2/14 Amiodarone discontinued due to sinus bradycardia  03/09/13: Rewarming started - Episode of VT  ETT 2/13 >>2/18  HPI/Subjective: Pt alert up in chair. Endorses breathing better.  Very animated and happy today.  Assessment/Plan:  Acute respiratory failure with Hypoxia due to acute pulmonary edema -Still requiring oxygen but has weaned - PCXR 2/22 c/w edema - rpt CXR 2/24 worse - (has preserved LV fnx) --continuing Lasix since still requiring O2 and has crackles- diuresed 3700 cc 2/24 and thus far today 1825 cc out  Recurrent VF arrest -likely the primary event leading to his collapse prior to presentation - Cardiology following - EP planning ICD insertion over the next few days - EF on TTE 2/20 is normal at 55-60% w/ no WMA - amiodarone dc'd  VDRF post arrest -Extubated 2/18 - no evidence of primary pulmonary issues   Sinus bradycardia on amiodarone  -Resolved w/ d/c of amio   Encephalopathy / ? sundowning -secondary to his anoxic event possibly coupled with ICU delirium - appears to worsen each night c/w sundowning - cont scheduled seroquel and follow   Uncontrolled Hypertension  -Recent issues with increased BP with agitation  leading to flash pulm edema - better on PO meds and NTG - increased BB this admit - ACE I already at max dose - Imdur added this admit  Hypernatremia  -resolved  Hypokalemia -replete  Anemia of critical illness  -Keep Hgb 8.0 or > - stable at present  Mild thrombocytopenia -Resolved  Klebsiella in sputum -Treated with unasyn - afeb - WBC normal - completed 7 days of tx 2/25  DM2 -Reasonably controlled at present - follow trend   Hydrocephalus -Appears congenital on MRI - no further interventions at this time  Seizures -Not clear if seizure > arrest vs arrest > convulsive syncope or seizure - significant doubt that this represents seizure as a primary event - not on AED - EEG w/o epileptiform activity   Nutrition  -Currently tolerating diet w/o difficulty  Code Status: FULL Family Communication: wife and daughter at bedside Disposition Plan: Transfer to telemetry - PT/OT - SNF vs CIR post AICD  Consultants: PCCM Cardiology Neurology  EP  Antibiotics: Vancomycin 2/17>>>2/19  Zosyn 2/17>>>2/19  Unasyn 2/19>>> 2/25  DVT prophylaxis: SQ heparin   Objective: Blood pressure 121/51, pulse 75, temperature 98.3 F (36.8 C), temperature source Oral, resp. rate 13, height 6' (1.829 m), weight 169 lb 8.5 oz (76.9 kg), SpO2 99.00%.  Intake/Output Summary (Last 24 hours) at 03/19/13 1313 Last data filed at 03/19/13 1200  Gross per 24 hour  Intake   1270 ml  Output   3925 ml  Net  -2655 ml   Exam: General: No acute respiratory distress at rest  Lungs: scattered bilateral crackles posteriorly, 2 L  Cardiovascular: Regular rate and rhythm without murmur gallop or rub  Abdomen: Nontender, nondistended, soft, bowel sounds positive, no rebound, no ascites, no appreciable mass Extremities: No significant cyanosis, clubbing;  No edema bilateral lower extremities  Data Reviewed: Basic Metabolic Panel:  Recent Labs Lab 03/13/13 0345 03/14/13 0325 03/15/13 0405  03/16/13 0405 03/17/13 0400 03/17/13 1735 03/18/13 0439 03/19/13 0421  NA 144 148* 152* 149* 148* 140 140 138  K 3.8 4.1 4.0 3.6* 3.0* 3.5* 3.4* 3.7  CL 105 111 113* 110 109 103 103 102  CO2 23 23 25 28 29 27 26 28   GLUCOSE 138* 155* 174* 172* 104* 133* 115* 164*  BUN 25* 24* 25* 28* 26* 25* 20 22  CREATININE 1.08 0.98 0.88 0.96 1.02 0.95 0.88 0.92  CALCIUM 8.2* 8.7 8.9 8.8 8.4 8.0* 8.1* 8.1*  MG 2.1 2.2 2.5  --   --  2.1  --   --   PHOS 3.1 3.5 3.8  --   --   --   --   --    Liver Function Tests:  Recent Labs Lab 03/17/13 1735  AST 64*  ALT 61*  ALKPHOS 58  BILITOT 0.5  PROT 7.2  ALBUMIN 2.0*    CBC:  Recent Labs Lab 03/13/13 0345 03/14/13 0325 03/15/13 0405 03/16/13 0405 03/17/13 1735 03/18/13 0439 03/19/13 0421  WBC 6.7 7.8 8.9 12.8* 9.9 7.1 7.2  NEUTROABS 4.6 5.5  --   --   --   --   --   HGB 9.0* 9.2* 9.2* 9.5* 8.1* 8.5* 8.1*  HCT 27.3* 27.3* 28.1* 28.3* 24.0* 24.5* 24.0*  MCV 91.6 91.6 93.4 94.0 92.0 91.4 91.6  PLT 161 197 206 236 212 210 212   CBG:  Recent Labs Lab 03/18/13 1151 03/18/13 1545 03/18/13 2202 03/19/13 0737 03/19/13 1224  GLUCAP 238* 155* 166* 144* 142*    Recent Results (from the past 240 hour(s))  URINE CULTURE     Status: None   Collection Time    03/10/13 11:13 AM      Result Value Ref Range Status   Specimen Description URINE, CATHETERIZED   Final   Special Requests NONE   Final   Culture  Setup Time     Final   Value: 03/10/2013 17:12     Performed at [facility] Count     Final   Value: 75,000 COLONIES/ML     Performed at [facility]   Culture     Final   Value: STAPHYLOCOCCUS SPECIES (COAGULASE NEGATIVE)     Note: RIFAMPIN AND GENTAMICIN SHOULD NOT BE USED AS SINGLE DRUGS FOR TREATMENT OF STAPH INFECTIONS.     Performed at [facility]   Report Status 03/12/2013 FINAL   Final   Organism ID, Bacteria STAPHYLOCOCCUS SPECIES (COAGULASE NEGATIVE)   Final  CULTURE, RESPIRATORY  (NON-EXPECTORATED)     Status: None   Collection Time    03/10/13 11:30 AM      Result Value Ref Range Status   Specimen Description TRACHEAL ASPIRATE   Final   Special Requests NONE   Final   Gram Stain     Final   Value: ABUNDANT WBC PRESENT, PREDOMINANTLY PMN     RARE SQUAMOUS EPITHELIAL CELLS PRESENT     ABUNDANT GRAM VARIABLE ROD     FEW GRAM POSITIVE COCCI     IN CLUSTERS   Culture     Final   Value: MODERATE KLEBSIELLA OXYTOCA  MODERATE KLEBSIELLA PNEUMONIAE     Performed at [facility]   Report Status 03/17/2013 FINAL   Final   Organism ID, Bacteria KLEBSIELLA OXYTOCA   Final   Organism ID, Bacteria KLEBSIELLA PNEUMONIAE   Final  CULTURE, BLOOD (ROUTINE X 2)     Status: None   Collection Time    03/10/13 11:42 AM      Result Value Ref Range Status   Specimen Description BLOOD LEFT ANTECUBITAL   Final   Special Requests BOTTLES DRAWN AEROBIC AND ANAEROBIC 10CC   Final   Culture  Setup Time     Final   Value: 03/10/2013 16:16     Performed at [facility]   Culture     Final   Value: NO GROWTH 5 DAYS     Performed at [facility]   Report Status 03/16/2013 FINAL   Final  CULTURE, BLOOD (ROUTINE X 2)     Status: None   Collection Time    03/10/13 11:50 AM      Result Value Ref Range Status   Specimen Description BLOOD LEFT HAND   Final   Special Requests BOTTLES DRAWN AEROBIC ONLY 10CC   Final   Culture  Setup Time     Final   Value: 03/10/2013 16:16     Performed at [facility]   Culture     Final   Value: NO GROWTH 5 DAYS     Performed at [facility]   Report Status 03/16/2013 FINAL   Final     Studies:  Recent x-ray studies have been reviewed in detail by the Attending Physician  Scheduled Meds:  Scheduled Meds: . amLODipine  10 mg Oral Daily  . ampicillin-sulbactam (UNASYN) IV  3 g Intravenous Q6H  . antiseptic oral rinse  15 mL Mouth Rinse BID  . aspirin  81 mg Oral Daily  . atorvastatin  40 mg Oral  q1800  . furosemide  60 mg Intravenous Q12H  . heparin subcutaneous  5,000 Units Subcutaneous 3 times per day  . insulin aspart  0-9 Units Subcutaneous TID WC  . isosorbide mononitrate  60 mg Oral Daily  . lisinopril  20 mg Oral BID  . metoprolol tartrate  75 mg Oral TID  . potassium chloride  40 mEq Oral BID  . QUEtiapine  12.5 mg Oral QHS  . spironolactone  25 mg Oral Daily    Time spent on care of this patient: 25 mins   ELLIS,ALLISON L. ANP  [facility] Office  [phone] Pager - Text Page per Shea Evans as per below:  On-Call/Text Page:      [email or web address]      password [email or web address]  If 7PM-7AM, please contact night-coverage [email or web address] Password [email or web address] 03/19/2013, 1:13 PM   LOS: 12 days   I have personally examined this patient and reviewed the entire database. I have reviewed the above note, made any necessary editorial changes, and agree with its content.  Cherene Altes, MD [facility]

## 2013-03-20 DIAGNOSIS — R1310 Dysphagia, unspecified: Secondary | ICD-10-CM

## 2013-03-20 DIAGNOSIS — R5381 Other malaise: Secondary | ICD-10-CM

## 2013-03-20 LAB — GLUCOSE, CAPILLARY
GLUCOSE-CAPILLARY: 252 mg/dL — AB (ref 70–99)
GLUCOSE-CAPILLARY: 142 mg/dL — AB (ref 70–99)
Glucose-Capillary: 162 mg/dL — ABNORMAL HIGH (ref 70–99)
Glucose-Capillary: 217 mg/dL — ABNORMAL HIGH (ref 70–99)

## 2013-03-20 LAB — BASIC METABOLIC PANEL
BUN: 21 mg/dL (ref 6–23)
CO2: 27 mEq/L (ref 19–32)
Calcium: 8.2 mg/dL — ABNORMAL LOW (ref 8.4–10.5)
Chloride: 99 mEq/L (ref 96–112)
Creatinine, Ser: 0.87 mg/dL (ref 0.50–1.35)
Glucose, Bld: 164 mg/dL — ABNORMAL HIGH (ref 70–99)
POTASSIUM: 4.4 meq/L (ref 3.7–5.3)
SODIUM: 134 meq/L — AB (ref 137–147)

## 2013-03-20 MED ORDER — FUROSEMIDE 10 MG/ML IJ SOLN
40.0000 mg | Freq: Two times a day (BID) | INTRAMUSCULAR | Status: AC
  Administered 2013-03-20 – 2013-03-22 (×5): 40 mg via INTRAVENOUS
  Filled 2013-03-20 (×4): qty 4

## 2013-03-20 MED ORDER — FUROSEMIDE 10 MG/ML IJ SOLN: 40.0000 mg | Freq: Two times a day (BID) | INTRAMUSCULAR | Status: DC

## 2013-03-20 NOTE — Progress Notes (Signed)
Physical Therapy Treatment Patient Details Name: Eric [name] MRN: [id number] DOB: 1955-12-26 Today's Date: 03/20/2013 Time: [id number] PT Time Calculation (min): 13 min  PT Assessment / Plan / Recommendation  History of Present Illness 58 yo with witnessed sz at [employer]. Apneic and pulseless at scene. CPR x 5 minutes, king airway placed by EMS, intubated in ED. 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 ETT 2/13-2/18   PT Comments   Pt is progressing well with gait, requiring less assist overall and ambulating today without the need for supplemental O2.  He continues to have poor awareness and insight into his balance deficits, although it seems that some of the cues from previous therapy sessions are carrying over (or at least he can report what he is supposed to be doing when prompted).  Will try cane next session and continue to work on safety with gait and dynamic balance.   Follow Up Recommendations  CIR     Does the patient have the potential to tolerate intense rehabilitation    Yes  Barriers to Discharge   None      Equipment Recommendations  Rolling walker with 5" wheels    Recommendations for Other Services   None  Frequency Min 3X/week   Progress towards PT Goals Progress towards PT goals: Progressing toward goals  Plan Current plan remains appropriate    Precautions / Restrictions Precautions Precautions: Fall Precaution Comments: unsteady with poor awareness   Pertinent Vitals/Pain O2 sats on RA with gait 94-98%.  HR stable, no DOE noted.      Mobility  Transfers Overall transfer level: Needs assistance Equipment used: Rolling walker (2 wheeled) Transfers: Sit to/from Stand Sit to Stand: Min guard General transfer comment: Min guard assist with verbal cues for safe hand placement.  Ambulation/Gait Ambulation/Gait assistance: Min guard Ambulation Distance (Feet): 300 Feet Assistive device: Rolling walker (2 wheeled) Gait Pattern/deviations:  Step-through pattern;Staggering left;Staggering right Gait velocity: decreased Gait velocity interpretation: Below normal speed for age/gender General Gait Details: Pt with staggering gait pattern even with RW, verbal cues needed for RW safety especially while turning.  Pt with LOB when turning his head right/left while continuing to walk.      PT Goals (current goals can now be found in the care plan section) Acute Rehab PT Goals Patient Stated Goal: return to playing piano and organ  Visit Information  Last PT Received On: 03/20/13 Assistance Needed: +1 History of Present Illness: 58 yo with witnessed sz at [employer]. Apneic and pulseless at scene. CPR x 5 minutes, king airway placed by EMS, intubated in ED. 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 ETT 2/13-2/18    Subjective Data  Subjective: Pt reports that he does not fee unsteady on his feet during gait (as he is staggering while using RW).   Patient Stated Goal: return to playing piano and organ   Cognition  Cognition Arousal/Alertness: Awake/alert Behavior During Therapy: WFL for tasks assessed/performed Overall Cognitive Status: Impaired/Different from baseline Area of Impairment: Safety/judgement;Awareness Safety/Judgement: Decreased awareness of deficits Awareness: Intellectual General Comments: Pt was able to remember from previous therapy sessions to push up from armrest to get to standing, but despite staggering gait pattern with RW in the hallway, he reports no problems with balance.      Balance  Balance Overall balance assessment: Needs assistance Standing balance support: Bilateral upper extremity supported Standing balance-Leahy Scale: Fair General Comments General comments (skin integrity, edema, etc.): O2 sats mid 90s on RA with  gait.  Pt left on RA at end of session. RN made aware  End of Session PT - End of Session Equipment Utilized During Treatment: Oxygen;Gait belt Activity Tolerance:  Patient tolerated treatment well Patient left: in chair;with call bell/phone within reach;with chair alarm set Nurse Communication: Mobility status;Other (comment) (O2 sats stable on RA with gait)     Ryhanna Dunsmore B. [place], [place], DPT [phone]   03/20/2013, 1:27 PM

## 2013-03-20 NOTE — Progress Notes (Signed)
Eric [name]  Eric [name] [id number] DOB: [birth year] DOA: 03/07/2013 PCP: No primary provider on file.  Admit HPI / Brief Narrative: 58 yo with witnessed convulstions at a [employer]. Apneic and pulseless at scene. CPR x 5 minutes , king airway placed by EMS, intubated in ED. Receives his care in [place]. 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   SIGNIFICANT EVENTS / STUDIES:  2/13 CT head: Hydrocephalus, with diffuse dilatation of the lateral and third ventricles.  2/13 Cards consult Terrence Dupont) and LHC: Cardiac cath with severe native vessel coronary artery disease but patent SVGs and LIMA  2/13 Neuro Consult 2/14 EEG: discontinuous slow background - no epileptiform activity noted 2/14 Amiodarone discontinued due to sinus bradycardia  03/09/13: Rewarming started - Episode of VT  ETT 2/13 >>2/18  HPI/Subjective: Pt alert up in chair. No complaints- remains eager to participate in PT  Assessment/Plan:  Acute respiratory failure with Hypoxia due to acute pulmonary edema -Still requiring oxygen but has weaned - [facility] 2/22 c/w edema - rpt CXR 2/24 worse - (has preserved LV fnx) --continuing Lasix since still requiring O2 and has crackles- has diuresed > 5700 cc thus far- 2/26: decrease Lasix to 40 mg IV BID  Recurrent VF arrest -likely the primary event leading to his collapse prior to presentation - Cardiology following - EP planning ICD insertion over the next few days - EF on TTE 2/20 is normal at 55-60% w/ no WMA - amiodarone dc'd  VDRF post arrest -Extubated 2/18 - no evidence of primary pulmonary issues   Sinus bradycardia on amiodarone  -Resolved w/ d/c of amio   Encephalopathy / ? sundowning -secondary to his anoxic event possibly coupled with ICU delirium - appears to worsen each night c/w sundowning - cont scheduled seroquel and follow -ST advancing diet/now up to D3 with thin liquids  Uncontrolled Hypertension   -Recent issues with increased BP with agitation leading to flash pulm edema - better on PO meds and NTG - increased BB this admit - ACE I already at max dose - Imdur added this admit  Hypernatremia  -resolved  Hypokalemia -replete  Anemia of critical illness  -Keep Hgb 8.0 or > - stable at present  Mild thrombocytopenia -Resolved  Klebsiella in sputum -Treated with unasyn - afeb - WBC normal - completed 7 days of tx 2/25  DM2 -Reasonably controlled at present - follow trend   Hydrocephalus -Appears congenital on MRI - no further interventions at this time  Seizures -Not clear if seizure > arrest vs arrest > convulsive syncope or seizure - significant doubt that this represents seizure as a primary event - not on AED - EEG w/o epileptiform activity   Nutrition  -Currently tolerating diet w/o difficulty  Code Status: FULL Family Communication: wife and daughter at bedside Disposition Plan: Transfer to telemetry - PT/OT - SNF vs CIR post AICD  Consultants: PCCM Cardiology Neurology  EP  Antibiotics: Vancomycin 2/17>>>2/19  Zosyn 2/17>>>2/19  Unasyn 2/19>>> 2/25  DVT prophylaxis: SQ heparin   Objective: Blood pressure 143/74, pulse 88, temperature 98.2 F (36.8 C), temperature source Oral, resp. rate 18, height 6' (1.829 m), weight 168 lb 14.4 oz (76.613 kg), SpO2 98.00%.  Intake/Output Summary (Last 24 hours) at 03/20/13 1227 Last data filed at 03/20/13 0700  Gross per 24 hour  Intake    830 ml  Output   3100 ml  Net  -2270 ml   Exam: General: No acute  respiratory distress at rest  Lungs: scattered bilateral crackles posteriorly, weaned down to 2 L Cardiovascular: Regular rate and rhythm without murmur gallop or rub  Abdomen: Nontender, nondistended, soft, bowel sounds positive, no rebound, no ascites, no appreciable mass Extremities: No significant cyanosis, clubbing;  No edema bilateral lower extremities  Data Reviewed: Basic Metabolic  Panel:  Recent Labs Lab 03/14/13 0325 03/15/13 0405  03/17/13 0400 03/17/13 1735 03/18/13 0439 03/19/13 0421 03/20/13 0520  NA 148* 152*  < > 148* 140 140 138 134*  K 4.1 4.0  < > 3.0* 3.5* 3.4* 3.7 4.4  CL 111 113*  < > 109 103 103 102 99  CO2 23 25  < > 29 27 26 28 27   GLUCOSE 155* 174*  < > 104* 133* 115* 164* 164*  BUN 24* 25*  < > 26* 25* 20 22 21   CREATININE 0.98 0.88  < > 1.02 0.95 0.88 0.92 0.87  CALCIUM 8.7 8.9  < > 8.4 8.0* 8.1* 8.1* 8.2*  MG 2.2 2.5  --   --  2.1  --   --   --   PHOS 3.5 3.8  --   --   --   --   --   --   < > = values in this interval not displayed. Liver Function Tests:  Recent Labs Lab 03/17/13 1735  AST 64*  ALT 61*  ALKPHOS 58  BILITOT 0.5  PROT 7.2  ALBUMIN 2.0*    CBC:  Recent Labs Lab 03/14/13 0325 03/15/13 0405 03/16/13 0405 03/17/13 1735 03/18/13 0439 03/19/13 0421  WBC 7.8 8.9 12.8* 9.9 7.1 7.2  NEUTROABS 5.5  --   --   --   --   --   HGB 9.2* 9.2* 9.5* 8.1* 8.5* 8.1*  HCT 27.3* 28.1* 28.3* 24.0* 24.5* 24.0*  MCV 91.6 93.4 94.0 92.0 91.4 91.6  PLT 197 206 236 212 210 212   CBG:  Recent Labs Lab 03/19/13 1224 03/19/13 1715 03/19/13 2135 03/20/13 0743 03/20/13 1129  GLUCAP 142* 124* 164* 162* 252*    No results found for this or any previous visit (from the past 240 hour(s)).   Studies:  Recent x-ray studies have been reviewed in detail by the Attending Physician  Scheduled Meds:  Scheduled Meds: . amLODipine  10 mg Oral Daily  . antiseptic oral rinse  15 mL Mouth Rinse BID  . aspirin  81 mg Oral Daily  . atorvastatin  40 mg Oral q1800  . furosemide  40 mg Intravenous Q12H  . heparin subcutaneous  5,000 Units Subcutaneous 3 times per day  . insulin aspart  0-9 Units Subcutaneous TID WC  . isosorbide mononitrate  60 mg Oral Daily  . lisinopril  20 mg Oral BID  . metoprolol tartrate  75 mg Oral TID  . potassium chloride  40 mEq Oral BID  . QUEtiapine  12.5 mg Oral QHS  . spironolactone  25 mg Oral  Daily    Time spent on care of this patient: 25 mins   ELLIS,ALLISON L. ANP  [facility] Office  [phone] Pager - Text Page per Shea Evans as per below:  On-Call/Text Page:      [email or web address]      password [email or web address]  If 7PM-7AM, please contact night-coverage [email or web address] Password [facility] 03/20/2013, 12:27 PM   LOS: 13 days       I have examined the patient, reviewed the chart and modified the above [name] which I agree with.   [id number]  [id number] 03/20/2013, 4:31 PM

## 2013-03-20 NOTE — Progress Notes (Signed)
Subjective:  Patient denies any complaints  Objective:  Vital Signs in the last 24 hours: Temp:  [98.2 F (36.8 C)-98.5 F (36.9 C)] 98.2 F (36.8 C) (02/26 0535) Pulse Rate:  [73-88] 88 (02/26 1143) Resp:  [18-20] 18 (02/26 0535) BP: (120-143)/(61-81) 143/74 mmHg (02/26 0535) SpO2:  [96 %-99 %] 98 % (02/26 1143) Weight:  [76.613 kg (168 lb 14.4 oz)] 76.613 kg (168 lb 14.4 oz) (02/26 0535)  Intake/Output from previous day: 02/25 0701 - 02/26 0700 In: 1440 [P.O.:1110; I.V.:210; IV Piggyback:100] Out: [id number] [Urine:4925] Intake/Output from this shift:    Physical Exam: Neck: no adenopathy, no carotid bruit, no JVD and supple, symmetrical, trachea midline Lungs: clear to auscultation bilaterally Heart: regular rate and rhythm, S1, S2 normal, no murmur, click, rub or gallop Abdomen: soft, non-tender; bowel sounds normal; no masses,  no organomegaly Extremities: extremities normal, atraumatic, no cyanosis or edema  Lab Results:  Recent Labs  03/18/13 0439 03/19/13 0421  WBC 7.1 7.2  HGB 8.5* 8.1*  PLT 210 212    Recent Labs  03/19/13 0421 03/20/13 0520  NA 138 134*  K 3.7 4.4  CL 102 99  CO2 28 27  GLUCOSE 164* 164*  BUN 22 21  CREATININE 0.92 0.87   No results found for this basename: TROPONINI, CK, MB,  in the last 72 hours Hepatic Function Panel  Recent Labs  03/17/13 1735  PROT 7.2  ALBUMIN 2.0*  AST 64*  ALT 61*  ALKPHOS 58  BILITOT 0.5   No results found for this basename: CHOL,  in the last 72 hours No results found for this basename: PROTIME,  in the last 72 hours  Imaging: Imaging results have been reviewed and No results found.  Cardiac Studies:  Assessment/Plan:  Status post recurrent V. fib cardiac arrest  Status post Vent dependent respiratory failure  Coronary artery disease status post CABG STATUS post left cath small vessel disease  Hypertension  Diabetes mellitus  Hypercholesteremia  Status post seizures  Status post Mild  volume overload  Status post Urinary retention  Anemia questionable etiology  Plan Continue present management Increase ambulation  LOS: 13 days    Kaycee Haycraft N 03/20/2013, 12:26 PM

## 2013-03-20 NOTE — Progress Notes (Signed)
Speech Language Pathology Treatment: Dysphagia  Patient Details Name: Eric [name] MRN: [id number] DOB: [birth year] Today's Date: 03/20/2013 Time: 0910-0930 SLP Time Calculation (min): 20 min  Assessment / Plan / Recommendation Clinical Impression  Treatment focused on therapeutic po trials to assess for potential for diet advancement. Patient able to verbalize understanding of compensatory strategies with min assist for use of multiple swallows and carryover into po intake. No overt indication of aspiration with clinician provided pos including dysphagia 3 texture solids. Education complete with patient regarding dysphagia 3 diet and good potential for further advancement with decreasing pharyngeal edema. Recommend advancing diet to dysphagia 3, thin liquids at this time with continued use of current safe swallowing strategies. Will continue to f/u at bedside for further diet advancement. If patient discharges prior to advancing to regular, recommend OP vs HH f/u briefly.     HPI HPI: 58 y/o male with PMH for CAD, CABG, DM, HTN admitted to ED following witnessed seizure at [employer].  Apneic and pulseless at scene.  CPR x5 minutes, king airway placed by EMS, intubated in ED, recurrent V fib aresst in ED required defibrillation.  CT indicates hydrocephalus with diffuse dilation to the lateral and third verntricles.  Intubated 2/13 to 2/18.  Postextubation dysphagia suspected following extubation.  BSE completed on 2/20 with recommendations of NPO.  F/u on 2/21 with recommendations to continue NPO with exception of medication crushed in puree.  Improvement noted in swallow ability on 2/21       SLP Plan  Continue with current plan of care    Recommendations Diet recommendations: Dysphagia 3 (mechanical soft);Thin liquid Liquids provided via: Cup;Straw Medication Administration: Crushed with puree Supervision: Patient able to self feed;Full supervision/cueing for compensatory  strategies Compensations: Multiple dry swallows after each bite/sip;Effortful swallow Postural Changes and/or Swallow Maneuvers: Seated upright 90 degrees;Upright 30-60 min after meal;Chin tuck              Oral Care Recommendations: Oral care BID Follow up Recommendations:  (TBD based on progress prior to d/c) Plan: Continue with current plan of care    [name], [place] [phone]   Eric [name] [name] 03/20/2013, 11:03 AM

## 2013-03-20 NOTE — Consult Note (Signed)
Physical Medicine and Rehabilitation Consult Reason for Consult: Acute respiratory failure/VDRF/recurrent VF arrest Referring Physician: [facility]   HPI: Eric [name] is a 58 y.o. right-handed male with history of hypertension, diabetes mellitus and peripheral neuropathy, CAD with CABG. Admitted 03/07/2013 with question witnessed seizure at [employer]. Appendectomy pulseless it seen. CPR x5 minutes. Patient was intubated. Recurrent V. fib arrest in the emergency department requiring defibrillation. Cranial CT scan showed hydrocephalus, with diffuse dilatation of the lateral and third ventricles as well as possible aqueductal stenosis. EEG completed showing only slowing without seizure. MRI completed showing no evidence of acute intracranial abnormality. Ventricular dilatation felt to be congenital developmental rather than representing acute hydrocephalus. Followup cardiology services with troponin levels unremarkable. Cardiac catheterization completed showing severe native vessel coronary artery disease but patent SVGs and LIMA. Initially maintained on amiodarone discontinued secondary to bradycardia. Await plan EP planning possible ICD insertion over the next few days secondary to recurrent VF arrest. Echocardiogram with ejection fraction of 60% no defect or PFO noted. Patient with bouts of confusion suspect encephalopathy questionable sundowning. Klebsiella in the sputum maintained on Unasyn. Subcutaneous heparin for DVT prophylaxis. Dysphagia 2 thin liquid diet. Physical therapy evaluations completed with recommendations of physical medicine rehabilitation consult  Patient ambulated 300 feet with supervision during physical therapy   Review of Systems  Cardiovascular: Positive for palpitations and leg swelling.  Gastrointestinal: Positive for constipation.  Musculoskeletal: Positive for myalgias.  All other systems reviewed and are negative.   Past Medical History  Diagnosis Date  .  Hypertension   . Hyperlipemia   . 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 Edema    Past Surgical History  Procedure Laterality Date  . Coronary artery bypass graft     History reviewed. No pertinent family history. Social History:  reports that he has never smoked. He does not have any smokeless tobacco history on file. His alcohol and drug histories are not on file. Allergies: No Known Allergies Medications Prior to Admission  Medication Sig Dispense Refill  . aspirin EC 81 MG tablet Take 81 mg by mouth daily.      Marland Kitchen gabapentin (NEURONTIN) 300 MG capsule Take 300 mg by mouth 2 (two) times daily.      Marland Kitchen glipiZIDE (GLUCOTROL) 5 MG tablet Take 5 mg by mouth daily before breakfast.      . lisinopril-hydrochlorothiazide (PRINZIDE,ZESTORETIC) 20-12.5 MG per tablet Take 1 tablet by mouth daily.      . metFORMIN (GLUMETZA) 500 MG (MOD) 24 hr tablet Take 500 mg by mouth daily with breakfast.      . metoprolol tartrate (LOPRESSOR) 25 MG tablet Take 25 mg by mouth 2 (two) times daily.      . simvastatin (ZOCOR) 20 MG tablet Take 20 mg by mouth daily.        Home: Home Living Family/patient expects to be discharged to:: Private residence Living Arrangements: Spouse/significant other;Children Available Help at Discharge: Family;Available 24 hours/day Type of Home: House Home Access: Level entry Home Layout: One level Home Equipment: None  Functional History:   Functional Status:  Mobility:     Ambulation/Gait Ambulation Distance (Feet): 175 Feet General Gait Details: pt with slight posterior lean with improving balance. Min assist to direct and advance RW and assist for balance and sequence throughout with cues for safety. Pt needed intermittent cues for anterior pelvic tilt tactilly and verbally as well as cues to stand upright.  Pt with overall fair  postural stability.  Lots of cues and assist needed for safety.  Followed pt with chair as well but did not need  it.      ADL:    Cognition: Cognition Overall Cognitive Status: Impaired/Different from baseline Orientation Level: Oriented X4 Cognition Arousal/Alertness: Awake/alert Behavior During Therapy: Flat affect;Impulsive Overall Cognitive Status: Impaired/Different from baseline Area of Impairment: Memory;Problem solving;Safety/judgement Memory: Decreased short-term memory Safety/Judgement: Decreased awareness of deficits;Decreased awareness of safety Problem Solving: Difficulty sequencing;Slow processing;Requires verbal cues;Requires tactile cues General Comments: Somewhat improved with a little less impulsivity and decr confusion.   Blood pressure 143/74, pulse 77, temperature 98.2 F (36.8 C), temperature source Oral, resp. rate 18, height 6' (1.829 m), weight 76.613 kg (168 lb 14.4 oz), SpO2 96.00%. Physical Exam  Vitals reviewed. Constitutional: He appears well-developed.  HENT:  Head: Normocephalic.  Eyes: EOM are normal.  Neck: Normal range of motion. Neck supple. No thyromegaly present.  Cardiovascular:  Cardiac rate controlled  Respiratory: Effort normal and breath sounds normal. No respiratory distress.  GI: Soft. Bowel sounds are normal. He exhibits no distension.  Neurological: He is alert.  Makes good eye contact with examiner. He was able to name place, age, date of birth. He could not recall situation leading to his hospital admission. Patient follows simple commands  Skin: Skin is warm and dry.   motor strength is 5/5 bilateral deltoid, bicep, tricep, grip, hip flexor, knee extensors, ankle dysfunction plantarflex for   Results for orders placed during the hospital encounter of 03/07/13 (from the past 24 hour(s))  GLUCOSE, CAPILLARY     Status: Abnormal   Collection Time    03/19/13  7:37 AM      Result Value Ref Range   Glucose-Capillary 144 (*) 70 - 99 mg/dL  GLUCOSE, CAPILLARY     Status: Abnormal   Collection Time    03/19/13 12:24 PM      Result Value  Ref Range   Glucose-Capillary 142 (*) 70 - 99 mg/dL  GLUCOSE, CAPILLARY     Status: Abnormal   Collection Time    03/19/13  5:15 PM      Result Value Ref Range   Glucose-Capillary 124 (*) 70 - 99 mg/dL  GLUCOSE, CAPILLARY     Status: Abnormal   Collection Time    03/19/13  9:35 PM      Result Value Ref Range   Glucose-Capillary 164 (*) 70 - 99 mg/dL   Comment 1 Notify RN    BASIC METABOLIC PANEL     Status: Abnormal   Collection Time    03/20/13  5:20 AM      Result Value Ref Range   Sodium 134 (*) 137 - 147 mEq/L   Potassium 4.4  3.7 - 5.3 mEq/L   Chloride 99  96 - 112 mEq/L   CO2 27  19 - 32 mEq/L   Glucose, Bld 164 (*) 70 - 99 mg/dL   BUN 21  6 - 23 mg/dL   Creatinine, Ser 0.87  0.50 - 1.35 mg/dL   Calcium 8.2 (*) 8.4 - 10.5 mg/dL   GFR calc non Af Amer >90  >90 mL/min   GFR calc Af Amer >90  >90 mL/min   No results found.  Assessment/Plan: Diagnosis: Deconditioning status post cardiac arrest with probable mild hypoxic encephalopathy 1. Does the need for close, 24 hr/day medical supervision in concert with the patient's rehab needs make it unreasonable for this patient to be served in a less intensive setting? No  2. Co-Morbidities requiring supervision/potential complications: Hypertension 3. Due to safety, does the patient require 24 hr/day rehab nursing? No 4. Does the patient require coordinated care of a physician, rehab nurse,  N/A to address physical and functional deficits in the context of the above medical diagnosis(es)? No Addressing deficits in the following areas: balance, cognition and swallowing 5. Can the patient actively participate in an intensive therapy program of at least 3 hrs of therapy per day at least 5 days per week? Yes 6. The potential for patient to make measurable gains while on inpatient rehab is poor 7. Anticipated functional outcomes upon discharge from inpatient rehab are N/A with PT, NA with OT, NA with SLP. 8. Estimated rehab length of  stay to reach the above functional goals is: NA 9. Does the patient have adequate social supports to accommodate these discharge functional goals? Potentially 10. Anticipated D/C setting: Home 11. Anticipated post D/C treatments: Outpt therapy 12. Overall Rehab/Functional Prognosis: good  RECOMMENDATIONS: This patient's condition is appropriate for continued rehabilitative care in the following setting: Outpt Patient has agreed to participate in recommended program. Potentially Note that insurance prior authorization may be required for reimbursement for recommended care.  Comment: ICD placement upcoming, should be only go home with outpatient therapy as followup    03/20/2013

## 2013-03-21 DIAGNOSIS — I472 Ventricular tachycardia, unspecified: Secondary | ICD-10-CM

## 2013-03-21 DIAGNOSIS — I4729 Other ventricular tachycardia: Secondary | ICD-10-CM

## 2013-03-21 LAB — BASIC METABOLIC PANEL
BUN: 21 mg/dL (ref 6–23)
CO2: 26 meq/L (ref 19–32)
Calcium: 8.7 mg/dL (ref 8.4–10.5)
Chloride: 102 mEq/L (ref 96–112)
Creatinine, Ser: 0.83 mg/dL (ref 0.50–1.35)
GFR calc Af Amer: >90 mL/min (ref >90)
GFR calc non Af Amer: >90 mL/min (ref >90)
GLUCOSE: 149 mg/dL — AB (ref 70–99)
POTASSIUM: 4.8 meq/L (ref 3.7–5.3)
Sodium: 136 mEq/L — ABNORMAL LOW (ref 137–147)

## 2013-03-21 LAB — GLUCOSE, CAPILLARY
GLUCOSE-CAPILLARY: 119 mg/dL — AB (ref 70–99)
GLUCOSE-CAPILLARY: 136 mg/dL — AB (ref 70–99)
Glucose-Capillary: 152 mg/dL — ABNORMAL HIGH (ref 70–99)
Glucose-Capillary: 199 mg/dL — ABNORMAL HIGH (ref 70–99)

## 2013-03-21 MED ORDER — INSULIN ASPART 100 UNIT/ML ~~LOC~~ SOLN
3.0000 [IU] | Freq: Three times a day (TID) | SUBCUTANEOUS | Status: DC
  Administered 2013-03-21 – 2013-03-25 (×10): 3 [IU] via SUBCUTANEOUS

## 2013-03-21 NOTE — Progress Notes (Addendum)
Patient ID: Eric [name]  male  [id number]    DOB: September 05, 1955    DOA: 03/07/2013  PCP: Wendie Simmer, MD  Admit HPI / Brief Narrative:  58 yo with witnessed convulstions at a [employer]. Apneic and pulseless at scene. CPR x 5 minutes , king airway placed by EMS, intubated in ED. Receives his care in [place]. 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    Assessment/Plan:  Acute respiratory failure with Hypoxia due to acute pulmonary edema improving - Weaned off of O2, O2 sats 98% on room air  - Chest x-ray on 2/24 worsening of the bilateral airspace disease due to progressive edema/pneumonia  -Continue IV Lasix and spironolactone, cardiology following, -8.7 L output   Recurrent VF arrest  -likely the primary event leading to his collapse prior to presentation  - Cardiology following - EP planning ICD insertion over the next few days (per Dr. Tanna Furry note on 2/22, just prior to rehab admit) -2-D echo on 2/20  showed EF  55-60% w/ no WMA - amiodarone dc'd   VDRF post arrest  -Extubated 2/18 - no evidence of primary pulmonary issues   Sinus bradycardia on amiodarone  -Resolved with discontinuation of amiodarone  Encephalopathy / ? sundowning  -secondary to his anoxic event possibly coupled with ICU delirium - appears to worsen each night c/w sundowning - - cont scheduled seroquel  - Swallow evaluation done, recommended dysphagia 3 mechanical soft diet with thin liquids  Uncontrolled Hypertension  - Recent issues with increased BP with agitation leading to flash pulm edema - BP currently stable on Norvasc, Lasix, spironolactone, Imdur, lisinopril , beta blocker  Hypernatremia  -resolved   Hypokalemia resolved   Anemia of critical illness  -Keep Hgb 8.0 or > - stable at present   Mild thrombocytopenia  -Resolved   Klebsiella in sputum  -Treated with unasyn - afeb - WBC normal - completed 7 days of tx 2/25   DM2  -Reasonably controlled at  present, added meal coverage  Hydrocephalus  -Appears congenital on MRI - no further interventions at this time   Seizures  -Not clear if seizure > arrest vs arrest > convulsive syncope or seizure - significant doubt that this represents seizure as a primary event - not on AED  - EEG w/o epileptiform activity   Nutrition  -Currently tolerating diet w/o difficulty   DVT Prophylaxis: Heparin subcutaneous  Code Status: Full code  Family Communication:  Disposition:  Consultants: Cardiology EP cardiology Inpatient rehabilitation  Neurology  Antibiotics: Vancomycin 2/17>>>2/19  Zosyn 2/17>>>2/19   Unasyn 2/19>>> 2/25  Subjective: No complaints, no chest pain or shortness of breath, was seen in the a.m., patient awaiting ICD placement  Objective: Weight change: -0.953 kg (-2 lb 1.6 oz)  Intake/Output Summary (Last 24 hours) at 03/21/13 1551 Last data filed at 03/21/13 [id number]  Gross per 24 hour  Intake    150 ml  Output   3400 ml  Net  -3250 ml   Blood pressure 107/65, pulse 71, temperature 97.9 F (36.6 C), temperature source Oral, resp. rate 17, height 6' (1.829 m), weight 75.66 kg (166 lb 12.8 oz), SpO2 98.00%.  Physical Exam: General: Alert and awake, oriented, not in any acute distress. CVS: S1-S2 clear, no murmur rubs or gallops Chest: clear to auscultation bilaterally, no wheezing, rales or rhonchi Abdomen: soft nontender, nondistended, normal bowel sounds  Extremities: no cyanosis, clubbing or edema noted bilaterally Neuro: Cranial nerves II-XII intact, no focal neurological deficits  Lab Results: Basic Metabolic Panel:  Recent Labs Lab 03/15/13 0405  03/17/13 1735  03/20/13 0520 03/21/13 0335  NA 152*  < > 140  < > 134* 136*  K 4.0  < > 3.5*  < > 4.4 4.8  CL 113*  < > 103  < > 99 102  CO2 25  < > 27  < > 27 26  GLUCOSE 174*  < > 133*  < > 164* 149*  BUN 25*  < > 25*  < > 21 21  CREATININE 0.88  < > 0.95  < > 0.87 0.83  CALCIUM 8.9  < > 8.0*  <  > 8.2* 8.7  MG 2.5  --  2.1  --   --   --   PHOS 3.8  --   --   --   --   --   < > = values in this interval not displayed. Liver Function Tests:  Recent Labs Lab 03/17/13 1735  AST 64*  ALT 61*  ALKPHOS 58  BILITOT 0.5  PROT 7.2  ALBUMIN 2.0*   No results found for this basename: LIPASE, AMYLASE,  in the last 168 hours No results found for this basename: AMMONIA,  in the last 168 hours CBC:  Recent Labs Lab 03/18/13 0439 03/19/13 0421  WBC 7.1 7.2  HGB 8.5* 8.1*  HCT 24.5* 24.0*  MCV 91.4 91.6  PLT 210 212   Cardiac Enzymes: No results found for this basename: CKTOTAL, CKMB, CKMBINDEX, TROPONINI,  in the last 168 hours BNP: No components found with this basename: POCBNP,  CBG:  Recent Labs Lab 03/20/13 1129 03/20/13 1649 03/20/13 2054 03/21/13 0753 03/21/13 1156  GLUCAP 252* 142* 217* 152* 119*     Micro Results: No results found for this or any previous visit (from the past 240 hour(s)).  Studies/Results: Ct Head (brain) Wo Contrast  03/07/2013   CLINICAL DATA:  Weakness seizure, became pulseless and apneic, post CPR, history hypertension, diabetes  EXAM: CT HEAD WITHOUT CONTRAST  CT CERVICAL SPINE WITHOUT CONTRAST  TECHNIQUE: Multidetector CT imaging of the head and cervical spine was performed following the standard protocol without intravenous contrast. Multiplanar CT image reconstructions of the cervical spine were also generated.  COMPARISON:  None  FINDINGS: CT HEAD FINDINGS  Motion artifacts, for which repeat imaging was performed.  Diffuse dilatation of the lateral and third ventricles compatible of hydrocephalus.  Fourth rectal decompressed.  Scattered fall seen calcification.  No midline shift or mass effect.  Otherwise normal appearance of brain parenchyma.  No intracranial hemorrhage, mass lesion or evidence acute infarction.  No extra-axial fluid collections.  Beam hardening artifacts of dental origin.  Bones and sinuses unremarkable.  CT CERVICAL  SPINE FINDINGS  Visualized skullbase intact.  Osseous mineralization normal.  Endotracheal and nasogastric tubes, with nasogastric tube coiled in pharynx.  Question infiltrate right apex.  Vertebral body and disc space heights maintained.  Prevertebral soft tissues normal thickness.  No acute fracture, subluxation or bone destruction.  IMPRESSION: Hydrocephalus, with diffuse dilatation of the lateral and third ventricles.  Lack of fourth ventricular dilatation raises question of aquaductal stenosis; consider followup MR imaging of the brain with and without contrast to assess.  No other intracranial abnormalities.  No acute cervical spine abnormalities.  Nasogastric tube coiled in pharynx - this finding was called to [facility] RN 2-Heart at 1418 hr on 03/07/2013.  Question right upper lobe infiltrate.   Electronically Signed   By: Crist Infante.D.  On: 03/07/2013 14:19   Ct Cervical Spine Wo Contrast  03/07/2013   CLINICAL DATA:  Weakness seizure, became pulseless and apneic, post CPR, history hypertension, diabetes  EXAM: CT HEAD WITHOUT CONTRAST  CT CERVICAL SPINE WITHOUT CONTRAST  TECHNIQUE: Multidetector CT imaging of the head and cervical spine was performed following the standard protocol without intravenous contrast. Multiplanar CT image reconstructions of the cervical spine were also generated.  COMPARISON:  None  FINDINGS: CT HEAD FINDINGS  Motion artifacts, for which repeat imaging was performed.  Diffuse dilatation of the lateral and third ventricles compatible of hydrocephalus.  Fourth rectal decompressed.  Scattered fall seen calcification.  No midline shift or mass effect.  Otherwise normal appearance of brain parenchyma.  No intracranial hemorrhage, mass lesion or evidence acute infarction.  No extra-axial fluid collections.  Beam hardening artifacts of dental origin.  Bones and sinuses unremarkable.  CT CERVICAL SPINE FINDINGS  Visualized skullbase intact.  Osseous mineralization normal.   Endotracheal and nasogastric tubes, with nasogastric tube coiled in pharynx.  Question infiltrate right apex.  Vertebral body and disc space heights maintained.  Prevertebral soft tissues normal thickness.  No acute fracture, subluxation or bone destruction.  IMPRESSION: Hydrocephalus, with diffuse dilatation of the lateral and third ventricles.  Lack of fourth ventricular dilatation raises question of aquaductal stenosis; consider followup MR imaging of the brain with and without contrast to assess.  No other intracranial abnormalities.  No acute cervical spine abnormalities.  Nasogastric tube coiled in pharynx - this finding was called to [facility] RN 2-Heart at 1418 hr on 03/07/2013.  Question right upper lobe infiltrate.   Electronically Signed   By: Lavonia Dana M.D.   On: 03/07/2013 14:19   Mr Jeri Cos [id number] Contrast  03/13/2013   CLINICAL DATA:  Recent cardiac arrest. Seizure and altered mental status. Ventriculomegaly on CT.  EXAM: MRI HEAD WITHOUT AND WITH CONTRAST  TECHNIQUE: Multiplanar, multiecho pulse sequences of the brain and surrounding structures were obtained without and with intravenous contrast.  CONTRAST:  80mL MULTIHANCE GADOBENATE DIMEGLUMINE 529 MG/ML IV SOLN  COMPARISON:  Head CT 03/07/2013  FINDINGS: Images are moderately degraded by motion artifact. The genu and anterior body of the corpus callosum are present, however the posterior body and splenium are not clearly identified. There is dilatation of the lateral and third ventricles, with marked dilatation of the atria of the lateral ventricles. The fourth ventricle is nondilated. The cerebral aqueduct appears grossly patent without tectal region mass seen. Periventricular T2 hyperintensities suggestive of transependymal CSF flow is identified.  There is no evidence of acute infarct. There is no evidence of mass, midline shift, or extra-axial fluid collection. Small foci of T2 hyperintensity within the cerebral white matter are nonspecific but  compatible with mild chronic small vessel ischemic disease. Small foci of susceptibility artifact are seen in the anterior right frontal lobe and at the margin of the right frontal horn, compatible with remote hemorrhage. There is also evidence of a small amount of remote hemorrhage in the region of the right basal ganglia. No gross abnormal enhancement is identified.  Major intracranial vascular flow voids are grossly preserved. Orbits are unremarkable. Paranasal sinuses and mastoid air cells are clear.  IMPRESSION: 1. Moderate motion artifact. No evidence of acute intracranial abnormality. 2. Ventricular dilatation, which is felt to be congenital/developmental rather than representing acute hydrocephalus, with corpus callosum dysgenesis. These results were called by telephone at the time of interpretation on 03/13/2013 at 7:20 PM to Dr. Leonel Ramsay, who verbally  acknowledged these results.   Electronically Signed   By: Logan Bores   On: 03/13/2013 19:46   Dg Chest Port 1 View  03/18/2013   CLINICAL DATA:  Seizure. Encephalopathy and seizure. Uncontrolled hypertension. Hypoxia.  EXAM: PORTABLE CHEST - 1 VIEW  COMPARISON:  Single view of the chest 03/17/2013 and 03/15/2013.  FINDINGS: Left IJ catheter remains in place. Bilateral airspace seen on yesterday's examination has markedly worsened. There are likely small bilateral pleural effusions. Cardiomegaly is noted.  IMPRESSION: Marked worsening bilateral airspace disease could be due to progressive edema or pneumonia.   Electronically Signed   By: Inge Rise M.D.   On: 03/18/2013 07:44   Dg Chest Port 1 View  03/17/2013   CLINICAL DATA:  58 year old male with acute respiratory failure. Cardiac arrest. Initial encounter.  EXAM: PORTABLE CHEST - 1 VIEW  COMPARISON:  03/15/2013 and earlier.  FINDINGS: Portable AP upright view at 0736 hr. Stable left IJ central line. Stable cardiac size and mediastinal contours. No pneumothorax. No pleural effusion  identified. Decreased widespread indistinct pulmonary opacity, residual now mostly in both upper lobes. No areas of worsening ventilation.  IMPRESSION: Regressed bilateral indistinct pulmonary opacity, favor resolving pulmonary edema. Residual in both upper lungs.   Electronically Signed   By: Lars Pinks M.D.   On: 03/17/2013 08:09   Dg Chest Port 1 View  03/15/2013   CLINICAL DATA:  Hypoxia.  Follow-up pulmonary edema.  EXAM: PORTABLE CHEST - 1 VIEW  COMPARISON:  DG CHEST 1V PORT dated 03/14/2013; DG CHEST 1V PORT dated 03/13/2013; DG CHEST 1V PORT dated 03/13/2013; DG CHEST 1V PORT dated 03/12/2013  FINDINGS: Prior sternotomy for CABG. Cardiac silhouette moderately enlarged but stable. Interval worsening of interstitial and airspace opacities throughout both lungs. Small bilateral pleural effusions suspected. Left jugular central venous catheter tip projects over the lower SVC, unchanged.  IMPRESSION: Interval worsening of now severe interstitial and airspace pulmonary edema throughout both lungs.   Electronically Signed   By: Evangeline Dakin M.D.   On: 03/15/2013 19:33   Dg Chest Port 1 View  03/14/2013   CLINICAL DATA:  Central line placement.  EXAM: PORTABLE CHEST - 1 VIEW  COMPARISON:  03/13/2013  FINDINGS: Prior CABG. Left central line is in place with the tip at the cavoatrial junction. No pneumothorax.  Cardiomegaly with vascular congestion and bilateral airspace opacities, likely edema. No real change.  IMPRESSION: Left central line tip at the cavoatrial junction.  No pneumothorax.  Bilateral airspace disease, likely edema, unchanged.   Electronically Signed   By: Rolm Baptise M.D.   On: 03/14/2013 05:09   Dg Chest Port 1 View  03/13/2013   CLINICAL DATA:  Status post nasogastric tube placement  EXAM: PORTABLE CHEST - 1 VIEW  COMPARISON:  DG CHEST 1V PORT dated 03/13/2013  FINDINGS: The nasogastric tube tip and proximal port lie well below the GE junction. The tube is coiled upon itself within the  gastric fundus. The lungs are borderline hypoinflated. The interstitial markings remain increased bilaterally. The cardiac silhouette remains enlarged and the pulmonary vascularity remains engorged. The left internal jugular venous catheter tip lies in the region of the proximal to mid SVC.  IMPRESSION: Interval placement of a nasogastric tube reveals reasonable positioning below the level of the GE junction.   Electronically Signed   By: David  Martinique   On: 03/13/2013 15:33   Dg Chest Port 1 View  03/13/2013   CLINICAL DATA:  Endotracheal tube position, shortness of  breath.  EXAM: PORTABLE CHEST - 1 VIEW  COMPARISON:  DG CHEST 1V PORT dated 03/12/2013  FINDINGS: Interval extubation. Interval removal of nasogastric tube. Left internal jugular central venous catheter with distal tip projecting in mid superior vena cava remains.  Stable mild moderate cardiomegaly, status post median sternotomy. Central pulmonary vasculature congestion interstitial prominence, slightly interstitial prominence. No pleural effusions. No pneumothorax.  Multiple EKG lines overlie the patient and may obscure subtle underlying pathology. Soft tissue planes and included osseous structures are unremarkable.  IMPRESSION: Interval extubation removal of nasogastric tube. No apparent change in position of left IJ line.  Stable cardiomegaly and worsening interstitial edema.   Electronically Signed   By: Elon Alas   On: 03/13/2013 05:58   Dg Chest Port 1 View  03/12/2013   CLINICAL DATA:  Assess endotracheal tube  EXAM: PORTABLE CHEST - 1 VIEW  COMPARISON:  DG CHEST 1V PORT dated 03/11/2013  FINDINGS: Endotracheal tube tip projects 4.3 cm above the carina. Left internal jugular central venous catheter with distal tip projecting in proximal superior vena cava. Nasogastric tube past the gastroesophageal junction, distal tip projecting pack at gastric fundus. Multiple EKG lines overlie the patient and may obscure subtle underlying  pathology.  Cardiac silhouette appears mildly enlarged, status post median sternotomy. Similar interstitial prominence in central pulmonary vasculature engorgement. Mild patchy perihilar airspace opacities without pleural effusions. No pneumothorax.  Soft tissue planes and included osseous structures are unremarkable.  IMPRESSION: No apparent change in position of life support lines.  Mild cardiomegaly and interstitial prominence with perihilar airspace opacities concerning for pulmonary edema.   Electronically Signed   By: Elon Alas   On: 03/12/2013 04:57   Dg Chest Port 1 View  03/11/2013   CLINICAL DATA:  Check ETT position.  EXAM: PORTABLE CHEST - 1 VIEW  COMPARISON:  03/10/2013  FINDINGS: Prior CABG. Support devices are stable. Slight increasing perihilar opacities and interstitial prominence, possibly interstitial edema. Heart is borderline in size. No effusions. No acute bony abnormality.  IMPRESSION: Increasing interstitial prominence and perihilar opacities concerning for mild edema.   Electronically Signed   By: Rolm Baptise M.D.   On: 03/11/2013 06:46   Dg Chest Port 1 View  03/10/2013   CLINICAL DATA:  Respiratory failure  EXAM: PORTABLE CHEST - 1 VIEW  COMPARISON:  Prior radiograph from 03/09/2013  FINDINGS: Endotracheal tube terminates well above the carina. Enteric tube courses into the abdomen with tip located in the gastric fundus. Left IJ central venous catheter tip overlies the distal SVC. Cardiac and mediastinal silhouettes unchanged. Median sternotomy wires remain intact.  The lungs are normally inflated. There has been interval improvement in previously seen confluent opacity within the right perihilar region. No overt pulmonary edema. No new focal infiltrate or pneumothorax. No pleural effusion.  IMPRESSION: 1. Lines and tubes in satisfactory position. 2. Interval improvement in the hazy opacity overlying the right mid lung. No new focal infiltrate or overt pulmonary edema  identified.   Electronically Signed   By: Jeannine Boga M.D.   On: 03/10/2013 06:09   Dg Chest Port 1 View  03/09/2013   CLINICAL DATA:  Respiratory failure.  EXAM: PORTABLE CHEST - 1 VIEW  COMPARISON:  03/07/2013  FINDINGS: Endotracheal tube has tip approximately 3.5 cm above the carina. Enteric tube courses into the region of the stomach and off the inferior portion of the film. Left IJ central venous catheter is unchanged with tip over the SVC. Lungs are adequately inflated with  slight worsening hazy opacification over the central right lung. No evidence of effusion or pneumothorax. Cardiomediastinal silhouette and remainder the exam is unchanged.  IMPRESSION: Slight worsening hazy opacification over the central right lung which may be due to asymmetric edema or infection.  Tubes and lines as described.   Electronically Signed   By: Marin Olp M.D.   On: 03/09/2013 08:46   Dg Chest Port 1 View  03/07/2013   CLINICAL DATA:  Cardiac arrest.  Central line placement.  EXAM: PORTABLE CHEST - 1 VIEW  COMPARISON:  03/07/2013  FINDINGS: Endotracheal tube and NG tube are in good position. Left jugular vein catheter is been inserted the tip is at the cavoatrial junction in good position. No pneumothorax. Left lung remains clear. There is slight haziness in the right perihilar region.  IMPRESSION: 1. Central venous catheter in good position. 2. New slight haziness in the right perihilar region. The possibility of aspiration pneumonitis should be considered.   Electronically Signed   By: Rozetta Nunnery M.D.   On: 03/07/2013 13:12   Dg Chest Portable 1 View  03/07/2013   CLINICAL DATA:  Intubation.  EXAM: PORTABLE CHEST - 1 VIEW  COMPARISON:  None.  FINDINGS: Endotracheal tube tip in satisfactory position projecting approximately 4 cm above the carina. Nasogastric tube tip in the distal esophagus just above the EG junction. Prior sternotomy for CABG. Cardiac silhouette upper normal in size to slightly  enlarged for AP portable technique. Linear atelectasis or scar in the lingula. Lungs otherwise clear. No localized airspace consolidation. No pleural effusions. No pneumothorax. Normal pulmonary vascularity.  IMPRESSION: 1. Endotracheal tube tip in satisfactory position projecting approximately 4 cm above the carina. 2. Nasogastric tube tip in the distal esophagus just above the EG junction. This should be advanced several cm. 3. Borderline heart size. Linear atelectasis or scar in the lingula. No acute cardiopulmonary disease otherwise.   Electronically Signed   By: Evangeline Dakin M.D.   On: 03/07/2013 11:59   Dg Abd Portable 1v  03/07/2013   CLINICAL DATA:  Nasogastric tube placement  EXAM: PORTABLE ABDOMEN - 1 VIEW  COMPARISON:  Chest x-ray 03/07/2013  FINDINGS: A nasogastric tube is present. The tip and proximal side hole are noted within the gastric fundus. No evidence of bowel obstruction. Contrast material is noted in the right renal collecting system. Mild cardiomegaly and prior median sternotomy are noted. No free air. A right femoral approach central venous catheter is present. The catheter tip projects over the L5 vertebral body likely within the common iliac vein.  IMPRESSION: The nasogastric tube is well positioned in the gastric fundus.  The tip of a right femoral approach central venous catheter projects over the L5 pedicle likely in the common iliac vein.   Electronically Signed   By: Jacqulynn Cadet M.D.   On: 03/07/2013 18:44   Dg Swallowing Func-speech Pathology  03/16/2013   Marcille Buffy, [place]     03/16/2013  9:23 PM Objective Swallowing Evaluation:    Patient Details  Name: Taye Mulloy MRN: [id number] Date of Birth: [birth year]  Today's Date: 03/16/2013 Time: 1630-1700 SLP Time Calculation (min): 30 min  Past Medical History:  Past Medical History  Diagnosis Date  . Hypertension   . Hyperlipemia   . 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 Edema    Past  Surgical History:  Past Surgical History  Procedure Laterality Date  . Coronary artery bypass graft  HPI:  58 y/o male with PMH for CAD, CABG, DM, HTN admitted to ED  following witnessed seizure at [employer].  Apneic and  pulseless at scene.  CPR x5 minutes, king airway placed by EMS,  intubated in ED, recurrent V fib arrest in ED required  defibrillation.  CT indicates hydrocephalus with diffuse dilation  to the lateral and third ventricles.  Intubated 2/13 to 2/18.   Postextubation dysphagia suspected following extubation.  BSE  completed on 2/219 with recommendations of NPO.  F/u on 2/20with  recommendations to continue NPO with exception of medication  crushed in puree.  F/u on 2/21 indicates improvement in overall  swallow ability to but continued with reduced ability to protect  airway fully.  Early am on 2/22 desat to low 80's with increased  confusion requiring Bi-Pap.   Per nursing patient maintaining  oxygen saturations with nasal cannula with improved LOA.  MBS  indicated to objectively assess for aspiration and determine  safest, PO diet as patient continues with NPO status.    Assessment / Plan / Recommendation Clinical Impression  Dysphagia Diagnosis: Mild oral phase dysphagia;Mild pharyngeal  phase dysphagia;Mild cervical esophageal phase dysphagia Mild to moderate pharyngeal dysphagia marked mainly by what  appears to laryngeal edema affecting bolus transit.  All swallows  triggered at valleculae space.  Bolus contained at level of C6  prior to completed transit.  Appeared to be artifact at C-6  radiologist present unable to confirm.  Moderate amount of  residuals at level of pyriforms requiring multiple swallows with  use of chin tuck to clear.  Patient did not sense containment.     Due to narrowing of pharynx from what appeared to be edema trials  of soft and regular solids and whole barium tablet deferred.   Brief esophageal screen indicates slow bolus transit with  backflow noted to  cervical esophagus with puree and thin liquid  trials.  No penetration or aspiration events occurred.  Recommend  to initiate dysphagia 1 (puree)  diet consistency and thin liquid  with full supervision with all PO's due to noted cognitive  deficits.  Recommend strict aspiration precautions and use of  swallow strategies as aspiration risk remains high s/p swallow.   Diagnostic treatment completed following study focusing on diet  recommendations and necessary swallow strategies to nursing.  ST  to follow closely in acute care  for diet tolerance.  Recommend  repeat MBS with clinical improvement prior to diet upgrade.      Treatment Recommendation       Diet Recommendation Dysphagia 1 (Puree);No mixed  consistencies;Thin liquid   Liquid Administration via: Cup;Straw Medication Administration: Crushed with puree Supervision: Full supervision/cueing for compensatory strategies Compensations: Multiple dry swallows after each  bite/sip;Effortful swallow Postural Changes and/or Swallow Maneuvers: Seated upright 90  degrees;Upright 30-60 min after meal;Chin tuck    Other  Recommendations Oral Care Recommendations: Oral care Q4  per protocol   Follow Up Recommendations  Inpatient Rehab    Frequency and Duration min 2x/week  2 weeks   Pertinent Vitals/Pain Oxygen saturations lower 90's on 6 L RR   Lower 20's    SLP Swallow Goals     General Date of Onset: 03/07/13 HPI: 58 y/o male with PMH for CAD, CABG, DM, HTN admitted to ED  following witnessed seizure at [employer].  Apneic and  pulseless at scene.  CPR x5 minutes, king airway placed by EMS,  intubated in ED, recurrent V fib aresst in ED  required  defibrillation.  CT indicates hydrocephalus with diffuse dilation  to the lateral and third verntricles.  Intubated 2/13 to 2/18.   Postextubation dysphagia suspected following extubation.  BSE  completed on 2/20 with recommendations of NPO.  F/u on 2/21 with  recommendations to continue NPO with exception of medication   crushed in puree.  Improvement noted in swallow ability on 2/21     Reason for Referral     Oral Phase Oral Preparation/Oral Phase Oral Phase: Impaired Oral - Thin Oral - Thin Teaspoon: Piecemeal swallowing;Holding of  bolus;Reduced posterior propulsion Oral - Thin Cup: Piecemeal swallowing;Holding of bolus;Reduced  posterior propulsion Oral - Thin Straw: Reduced posterior propulsion Oral - Solids Oral - Puree: Piecemeal swallowing;Reduced posterior propulsion Oral - Mechanical Soft: Not tested   Pharyngeal Phase Pharyngeal Phase Pharyngeal Phase: Impaired Pharyngeal - Nectar Pharyngeal - Nectar Teaspoon: Reduced pharyngeal  peristalsis;Reduced anterior laryngeal mobility;Reduced laryngeal  elevation;Reduced airway/laryngeal closure;Pharyngeal residue -  pyriform sinuses Pharyngeal - Thin Pharyngeal - Thin Teaspoon: Reduced pharyngeal  peristalsis;Reduced tongue base retraction;Reduced anterior  laryngeal mobility;Reduced laryngeal elevation;Reduced  airway/laryngeal closure;Pharyngeal residue - pyriform sinuses Pharyngeal - Thin Cup: Reduced pharyngeal peristalsis;Reduced  tongue base retraction;Reduced anterior laryngeal  mobility;Reduced laryngeal elevation;Reduced airway/laryngeal  closure;Pharyngeal residue - pyriform sinuses Pharyngeal - Thin Straw: Reduced pharyngeal peristalsis;Reduced  anterior laryngeal mobility;Reduced laryngeal elevation;Reduced  airway/laryngeal closure;Pharyngeal residue - pyriform sinuses Pharyngeal - Solids Pharyngeal - Puree: Reduced pharyngeal peristalsis;Reduced tongue  base retraction;Reduced anterior laryngeal mobility;Reduced  laryngeal elevation;Reduced airway/laryngeal closure;Pharyngeal  residue - pyriform sinuses;Pharyngeal residue - cp segment  Cervical Esophageal Phase    GO    Cervical Esophageal Phase Cervical Esophageal Phase: Impaired Cervical Esophageal Phase - Thin Thin Straw: Prominent cricopharyngeal segment;Esophageal backflow  into cervical esophagus Cervical  Esophageal Phase - Solids Puree: Prominent cricopharyngeal segment;Esophageal backflow into  cervical esophagus        Sharman Crate MS, CCC-SLP [phone] [facility] 03/16/2013, 8:57 PM     Medications: Scheduled Meds: . amLODipine  10 mg Oral Daily  . antiseptic oral rinse  15 mL Mouth Rinse BID  . aspirin  81 mg Oral Daily  . atorvastatin  40 mg Oral q1800  . furosemide  40 mg Intravenous Q12H  . heparin subcutaneous  5,000 Units Subcutaneous 3 times per day  . insulin aspart  0-9 Units Subcutaneous TID WC  . insulin aspart  3 Units Subcutaneous TID WC  . isosorbide mononitrate  60 mg Oral Daily  . lisinopril  20 mg Oral BID  . metoprolol tartrate  75 mg Oral TID  . potassium chloride  40 mEq Oral BID  . QUEtiapine  12.5 mg Oral QHS  . spironolactone  25 mg Oral Daily      LOS: 14 days   Adhvik Canady M.D. [facility] 03/21/2013, 3:51 PM Pager: [phone]  If 7PM-7AM, please contact night-coverage [email or web address] Password [email or web address]

## 2013-03-21 NOTE — Progress Notes (Signed)
Subjective:  Doing well denies any chest pain or shortness of breath  Objective:  Vital Signs in the last 24 hours: Temp:  [97.3 F (36.3 C)-97.8 F (36.6 C)] 97.8 F (36.6 C) (02/27 0525) Pulse Rate:  [70-79] 79 (02/27 1123) Resp:  [18] 18 (02/27 0525) BP: (120-139)/(63-76) 134/76 mmHg (02/27 1123) SpO2:  [96 %-99 %] 96 % (02/27 0525) Weight:  [75.66 kg (166 lb 12.8 oz)] 75.66 kg (166 lb 12.8 oz) (02/27 0525)  Intake/Output from previous day: 02/26 0701 - 02/27 0700 In: 410 [P.O.:410] Out: 3400 [Urine:3400] Intake/Output from this shift:    Physical Exam: Neck: no adenopathy, no carotid bruit, no JVD and supple, symmetrical, trachea midline Lungs: clear to auscultation bilaterally Heart: regular rate and rhythm, S1, S2 normal, no murmur, click, rub or gallop Abdomen: soft, non-tender; bowel sounds normal; no masses,  no organomegaly Extremities: extremities normal, atraumatic, no cyanosis or edema  Lab Results:  Recent Labs  03/19/13 0421  WBC 7.2  HGB 8.1*  PLT 212    Recent Labs  03/20/13 0520 03/21/13 0335  NA 134* 136*  K 4.4 4.8  CL 99 102  CO2 27 26  GLUCOSE 164* 149*  BUN 21 21  CREATININE 0.87 0.83   No results found for this basename: TROPONINI, CK, MB,  in the last 72 hours Hepatic Function Panel No results found for this basename: PROT, ALBUMIN, AST, ALT, ALKPHOS, BILITOT, BILIDIR, IBILI,  in the last 72 hours No results found for this basename: CHOL,  in the last 72 hours No results found for this basename: PROTIME,  in the last 72 hours  Imaging: Imaging results have been reviewed and No results found.  Cardiac Studies:  Assessment/Plan:  Status post recurrent V. fib cardiac arrest  Status post Vent dependent respiratory failure  Coronary artery disease status post CABG STATUS post left cath small vessel disease  Hypertension  Diabetes mellitus  Hypercholesteremia  Status post seizures  Status post Mild volume overload  Status post  Urinary retention  Anemia questionable etiology  Plan Continue present management scheduled for ICD I will sign off. Followup with me in 2 weeks  LOS: 14 days    Jasmin Winberry N 03/21/2013, 1:19 PM

## 2013-03-21 NOTE — Progress Notes (Signed)
Rehab admissions - Noted that pt is ambulating 300' with min guard assist and that rehab MD anticipates that pt will likely be appropriate for outpatient services at discharge. Pt is doing too well for inpatient rehab stay and recommend follow up home health services or skilled nursing if needed.  Please call me with any questions. Thanks.  Nanetta Batty, PT Rehabilitation Admissions Coordinator [phone]

## 2013-03-21 NOTE — Progress Notes (Addendum)
Inpatient Diabetes Program Recommendations  AACE/ADA: New Consensus Statement on Inpatient Glycemic Control (2013)  Target Ranges:  Prepandial:   less than 140 mg/dL      Peak postprandial:   less than 180 mg/dL (1-2 hours)      Critically ill patients:  140 - 180 mg/dL     Results for KYRIL, CHAMPIGNY (MRN [id number]) as of 03/21/2013 09:02  Ref. Range 03/20/2013 07:43 03/20/2013 11:29 03/20/2013 16:49 03/20/2013 20:54  Glucose-Capillary Latest Range: 70-99 mg/dL 162 (H) 252 (H) 142 (H) 217 (H)    **Patient eating 100% of meals.  Noted patient having some issues with mild hyperglycemia after meals.   **MD- Please consider adding low dose meal coverage- Novolog 3 units tid with meals   Will follow. Wyn Quaker RN, MSN, CDE Diabetes Coordinator Inpatient Diabetes Program Team Pager: [phone] (8a-10p)

## 2013-03-21 NOTE — Progress Notes (Signed)
Speech Language Pathology Treatment: Dysphagia  Patient Details Name: Eric [name] MRN: [id number] DOB: 12/13/1955 Today's Date: 03/21/2013 Time: 1445-1501 SLP Time Calculation (min): 16 min  Assessment / Plan / Recommendation Clinical Impression  Patient demonstrated and verbalized compensatory strategies for improved swallow function independently (no cues required).  Pt. States he is consistently following these strategies in addition to  Aspiration precautions.  Pt. Appears to be tolerating Dysphagia 3 with thin liquids.  Lung sounds are clear to auscultation, per MD, and pt. Is afebrile.  Will d/c ST at this time, as goals are met and education complete.   HPI HPI: 57 y/o male with PMH for CAD, CABG, DM, HTN admitted to ED [place].  Apneic and pulseless at scene.  CPR x5 minutes, king airway placed by EMS, intubated in ED, recurrent V fib aresst in ED required defibrillation.  CT indicates hydrocephalus with diffuse dilation to the lateral and third verntricles.  Intubated 2/13 to 2/18.  Postextubation dysphagia suspected following extubation.  BSE completed on 2/20 with recommendations of NPO.  F/u on 2/21 with recommendations to continue NPO with exception of medication crushed in puree.  Improvement noted in swallow ability on 2/21    Pertinent Vitals LS CTA, afebrile  SLP Plan       Recommendations Diet recommendations: Dysphagia 3 (mechanical soft);Thin liquid Liquids provided via: Cup;Straw Medication Administration: Whole meds with puree Supervision: Patient able to self feed;Intermittent supervision to cue for compensatory strategies Compensations: Multiple dry swallows after each bite/sip;Effortful swallow Postural Changes and/or Swallow Maneuvers: Chin tuck;Upright 30-60 min after meal;Seated upright 90 degrees              Oral Care Recommendations: Oral care BID Follow up Recommendations: Inpatient Rehab    GO     ,   T 03/21/2013, 3:02 PM    

## 2013-03-22 LAB — GLUCOSE, CAPILLARY
GLUCOSE-CAPILLARY: 116 mg/dL — AB (ref 70–99)
GLUCOSE-CAPILLARY: 171 mg/dL — AB (ref 70–99)
Glucose-Capillary: 98 mg/dL (ref 70–99)
Glucose-Capillary: 215 mg/dL — ABNORMAL HIGH (ref 70–99)
Glucose-Capillary: 142 mg/dL — ABNORMAL HIGH (ref 70–99)

## 2013-03-22 NOTE — Progress Notes (Signed)
Patient ID: Karion Presti  male  [id number]    DOB: 1955/07/19    DOA: 03/07/2013  PCP: Wendie Simmer, MD  Admit HPI / Brief Narrative:  58 yo with witnessed convulstions at a [employer]. Apneic and pulseless at scene. CPR x 5 minutes , king airway placed by EMS, intubated in ED. Receives his care in [place]. 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    Assessment/Plan:  Acute respiratory failure with Hypoxia due to acute pulmonary edema improving - Weaned off of O2, O2 sats 100% on room air  - Chest x-ray on 2/24 worsening of the bilateral airspace disease due to progressive edema/pneumonia  - finished IV Lasix , continue spironolactone, cardiology following, -14.8 L output   Recurrent VF arrest  -likely the primary event leading to his collapse prior to presentation  - Cardiology following - EP planning ICD insertion over the next few days (per Dr. Tanna Furry note on 2/22, just prior to rehab admit), will check with EP, patient does not want rehab. Patient is doing very well for inpatient rehabilitation stay, they recommended home health services or SNF.  -2-D echo on 2/20  showed EF  55-60% w/ no WMA - amiodarone dc'd   VDRF post arrest  -Extubated 2/18 - no evidence of primary pulmonary issues   Sinus bradycardia on amiodarone  -Resolved with discontinuation of amiodarone  Encephalopathy / ? sundowning - significantly improved -secondary to his anoxic event possibly coupled with ICU delirium -  cont scheduled seroquel  - Swallow evaluation done, recommended dysphagia 3 mechanical soft diet with thin liquids  Uncontrolled Hypertension  - Recent issues with increased BP with agitation leading to flash pulm edema - BP currently stable on Norvasc, Lasix, spironolactone, Imdur, lisinopril , beta blocker  Hypernatremia  -resolved   Hypokalemia resolved   Anemia of critical illness  -Keep Hgb 8.0 or > - stable at present   Mild thrombocytopenia    -Resolved   Klebsiella in sputum  -Treated with unasyn - afeb - WBC normal - completed 7 days of tx 2/25   DM2  -Reasonably controlled at present, added meal coverage  Hydrocephalus  -Appears congenital on MRI - no further interventions at this time   Seizures  -Not clear if seizure > arrest vs arrest > convulsive syncope or seizure - significant doubt that this represents seizure as a primary event - not on AED  - EEG w/o epileptiform activity   Nutrition  -Currently tolerating diet w/o difficulty   DVT Prophylaxis: Heparin subcutaneous  Code Status: Full code  Family Communication:  Disposition:  Consultants: Cardiology EP cardiology Inpatient rehabilitation  Neurology  Antibiotics: Vancomycin 2/17>>>2/19  Zosyn 2/17>>>2/19   Unasyn 2/19>>> 2/25  Subjective: No complaints, no chest pain or shortness of breath, awaiting ICD placement   Objective: Weight change: -4.173 kg (-9 lb 3.2 oz)  Intake/Output Summary (Last 24 hours) at 03/22/13 1324 Last data filed at 03/22/13 1217  Gross per 24 hour  Intake    300 ml  Output   6425 ml  Net  -6125 ml   Blood pressure 158/74, pulse 74, temperature 98.2 F (36.8 C), temperature source Oral, resp. rate 16, height 6' (1.829 m), weight 71.487 kg (157 lb 9.6 oz), SpO2 100.00%.  Physical Exam: General: Alert and awake, oriented, not in any acute distress. CVS: S1-S2 clear, no murmur rubs or gallops Chest: clear to auscultation bilaterally, no wheezing, rales or rhonchi Abdomen: soft nontender, nondistended, normal bowel sounds  Extremities: no cyanosis, clubbing or edema noted bilaterally Neuro: Cranial nerves II-XII intact, no focal neurological deficits  Lab Results: Basic Metabolic Panel:  Recent Labs Lab 03/17/13 1735  03/20/13 0520 03/21/13 0335  NA 140  < > 134* 136*  K 3.5*  < > 4.4 4.8  CL 103  < > 99 102  CO2 27  < > 27 26  GLUCOSE 133*  < > 164* 149*  BUN 25*  < > 21 21  CREATININE 0.95  < >  0.87 0.83  CALCIUM 8.0*  < > 8.2* 8.7  MG 2.1  --   --   --   < > = values in this interval not displayed. Liver Function Tests:  Recent Labs Lab 03/17/13 1735  AST 64*  ALT 61*  ALKPHOS 58  BILITOT 0.5  PROT 7.2  ALBUMIN 2.0*   No results found for this basename: LIPASE, AMYLASE,  in the last 168 hours No results found for this basename: AMMONIA,  in the last 168 hours CBC:  Recent Labs Lab 03/18/13 0439 03/19/13 0421  WBC 7.1 7.2  HGB 8.5* 8.1*  HCT 24.5* 24.0*  MCV 91.4 91.6  PLT 210 212   Cardiac Enzymes: No results found for this basename: CKTOTAL, CKMB, CKMBINDEX, TROPONINI,  in the last 168 hours BNP: No components found with this basename: POCBNP,  CBG:  Recent Labs Lab 03/21/13 1553 03/21/13 2148 03/22/13 0641 03/22/13 0753 03/22/13 1129  GLUCAP 199* 136* 116* 98 215*     Micro Results: No results found for this or any previous visit (from the past 240 hour(s)).  Studies/Results: Ct Head (brain) Wo Contrast  03/07/2013   CLINICAL DATA:  Weakness seizure, became pulseless and apneic, post CPR, history hypertension, diabetes  EXAM: CT HEAD WITHOUT CONTRAST  CT CERVICAL SPINE WITHOUT CONTRAST  TECHNIQUE: Multidetector CT imaging of the head and cervical spine was performed following the standard protocol without intravenous contrast. Multiplanar CT image reconstructions of the cervical spine were also generated.  COMPARISON:  None  FINDINGS: CT HEAD FINDINGS  Motion artifacts, for which repeat imaging was performed.  Diffuse dilatation of the lateral and third ventricles compatible of hydrocephalus.  Fourth rectal decompressed.  Scattered fall seen calcification.  No midline shift or mass effect.  Otherwise normal appearance of brain parenchyma.  No intracranial hemorrhage, mass lesion or evidence acute infarction.  No extra-axial fluid collections.  Beam hardening artifacts of dental origin.  Bones and sinuses unremarkable.  CT CERVICAL SPINE FINDINGS   Visualized skullbase intact.  Osseous mineralization normal.  Endotracheal and nasogastric tubes, with nasogastric tube coiled in pharynx.  Question infiltrate right apex.  Vertebral body and disc space heights maintained.  Prevertebral soft tissues normal thickness.  No acute fracture, subluxation or bone destruction.  IMPRESSION: Hydrocephalus, with diffuse dilatation of the lateral and third ventricles.  Lack of fourth ventricular dilatation raises question of aquaductal stenosis; consider followup MR imaging of the brain with and without contrast to assess.  No other intracranial abnormalities.  No acute cervical spine abnormalities.  Nasogastric tube coiled in pharynx - this finding was called to [facility] RN 2-Heart at 1418 hr on 03/07/2013.  Question right upper lobe infiltrate.   Electronically Signed   By: Lavonia Dana M.D.   On: 03/07/2013 14:19   Ct Cervical Spine Wo Contrast  03/07/2013   CLINICAL DATA:  Weakness seizure, became pulseless and apneic, post CPR, history hypertension, diabetes  EXAM: CT HEAD WITHOUT CONTRAST  CT CERVICAL  SPINE WITHOUT CONTRAST  TECHNIQUE: Multidetector CT imaging of the head and cervical spine was performed following the standard protocol without intravenous contrast. Multiplanar CT image reconstructions of the cervical spine were also generated.  COMPARISON:  None  FINDINGS: CT HEAD FINDINGS  Motion artifacts, for which repeat imaging was performed.  Diffuse dilatation of the lateral and third ventricles compatible of hydrocephalus.  Fourth rectal decompressed.  Scattered fall seen calcification.  No midline shift or mass effect.  Otherwise normal appearance of brain parenchyma.  No intracranial hemorrhage, mass lesion or evidence acute infarction.  No extra-axial fluid collections.  Beam hardening artifacts of dental origin.  Bones and sinuses unremarkable.  CT CERVICAL SPINE FINDINGS  Visualized skullbase intact.  Osseous mineralization normal.  Endotracheal and  nasogastric tubes, with nasogastric tube coiled in pharynx.  Question infiltrate right apex.  Vertebral body and disc space heights maintained.  Prevertebral soft tissues normal thickness.  No acute fracture, subluxation or bone destruction.  IMPRESSION: Hydrocephalus, with diffuse dilatation of the lateral and third ventricles.  Lack of fourth ventricular dilatation raises question of aquaductal stenosis; consider followup MR imaging of the brain with and without contrast to assess.  No other intracranial abnormalities.  No acute cervical spine abnormalities.  Nasogastric tube coiled in pharynx - this finding was called to [facility] RN 2-Heart at 1418 hr on 03/07/2013.  Question right upper lobe infiltrate.   Electronically Signed   By: Lavonia Dana M.D.   On: 03/07/2013 14:19   Mr Jeri Cos [id number] Contrast  03/13/2013   CLINICAL DATA:  Recent cardiac arrest. Seizure and altered mental status. Ventriculomegaly on CT.  EXAM: MRI HEAD WITHOUT AND WITH CONTRAST  TECHNIQUE: Multiplanar, multiecho pulse sequences of the brain and surrounding structures were obtained without and with intravenous contrast.  CONTRAST:  86mL MULTIHANCE GADOBENATE DIMEGLUMINE 529 MG/ML IV SOLN  COMPARISON:  Head CT 03/07/2013  FINDINGS: Images are moderately degraded by motion artifact. The genu and anterior body of the corpus callosum are present, however the posterior body and splenium are not clearly identified. There is dilatation of the lateral and third ventricles, with marked dilatation of the atria of the lateral ventricles. The fourth ventricle is nondilated. The cerebral aqueduct appears grossly patent without tectal region mass seen. Periventricular T2 hyperintensities suggestive of transependymal CSF flow is identified.  There is no evidence of acute infarct. There is no evidence of mass, midline shift, or extra-axial fluid collection. Small foci of T2 hyperintensity within the cerebral white matter are nonspecific but compatible with  mild chronic small vessel ischemic disease. Small foci of susceptibility artifact are seen in the anterior right frontal lobe and at the margin of the right frontal horn, compatible with remote hemorrhage. There is also evidence of a small amount of remote hemorrhage in the region of the right basal ganglia. No gross abnormal enhancement is identified.  Major intracranial vascular flow voids are grossly preserved. Orbits are unremarkable. Paranasal sinuses and mastoid air cells are clear.  IMPRESSION: 1. Moderate motion artifact. No evidence of acute intracranial abnormality. 2. Ventricular dilatation, which is felt to be congenital/developmental rather than representing acute hydrocephalus, with corpus callosum dysgenesis. These results were called by telephone at the time of interpretation on 03/13/2013 at 7:20 PM to Dr. Leonel Ramsay, who verbally acknowledged these results.   Electronically Signed   By: Logan Bores   On: 03/13/2013 19:46   Dg Chest Port 1 View  03/18/2013   CLINICAL DATA:  Seizure. Encephalopathy and seizure. Uncontrolled  hypertension. Hypoxia.  EXAM: PORTABLE CHEST - 1 VIEW  COMPARISON:  Single view of the chest 03/17/2013 and 03/15/2013.  FINDINGS: Left IJ catheter remains in place. Bilateral airspace seen on yesterday's examination has markedly worsened. There are likely small bilateral pleural effusions. Cardiomegaly is noted.  IMPRESSION: Marked worsening bilateral airspace disease could be due to progressive edema or pneumonia.   Electronically Signed   By: Inge Rise M.D.   On: 03/18/2013 07:44   Dg Chest Port 1 View  03/17/2013   CLINICAL DATA:  58 year old male with acute respiratory failure. Cardiac arrest. Initial encounter.  EXAM: PORTABLE CHEST - 1 VIEW  COMPARISON:  03/15/2013 and earlier.  FINDINGS: Portable AP upright view at 0736 hr. Stable left IJ central line. Stable cardiac size and mediastinal contours. No pneumothorax. No pleural effusion identified. Decreased  widespread indistinct pulmonary opacity, residual now mostly in both upper lobes. No areas of worsening ventilation.  IMPRESSION: Regressed bilateral indistinct pulmonary opacity, favor resolving pulmonary edema. Residual in both upper lungs.   Electronically Signed   By: Lars Pinks M.D.   On: 03/17/2013 08:09   Dg Chest Port 1 View  03/15/2013   CLINICAL DATA:  Hypoxia.  Follow-up pulmonary edema.  EXAM: PORTABLE CHEST - 1 VIEW  COMPARISON:  DG CHEST 1V PORT dated 03/14/2013; DG CHEST 1V PORT dated 03/13/2013; DG CHEST 1V PORT dated 03/13/2013; DG CHEST 1V PORT dated 03/12/2013  FINDINGS: Prior sternotomy for CABG. Cardiac silhouette moderately enlarged but stable. Interval worsening of interstitial and airspace opacities throughout both lungs. Small bilateral pleural effusions suspected. Left jugular central venous catheter tip projects over the lower SVC, unchanged.  IMPRESSION: Interval worsening of now severe interstitial and airspace pulmonary edema throughout both lungs.   Electronically Signed   By: Evangeline Dakin M.D.   On: 03/15/2013 19:33   Dg Chest Port 1 View  03/14/2013   CLINICAL DATA:  Central line placement.  EXAM: PORTABLE CHEST - 1 VIEW  COMPARISON:  03/13/2013  FINDINGS: Prior CABG. Left central line is in place with the tip at the cavoatrial junction. No pneumothorax.  Cardiomegaly with vascular congestion and bilateral airspace opacities, likely edema. No real change.  IMPRESSION: Left central line tip at the cavoatrial junction.  No pneumothorax.  Bilateral airspace disease, likely edema, unchanged.   Electronically Signed   By: Rolm Baptise M.D.   On: 03/14/2013 05:09   Dg Chest Port 1 View  03/13/2013   CLINICAL DATA:  Status post nasogastric tube placement  EXAM: PORTABLE CHEST - 1 VIEW  COMPARISON:  DG CHEST 1V PORT dated 03/13/2013  FINDINGS: The nasogastric tube tip and proximal port lie well below the GE junction. The tube is coiled upon itself within the gastric fundus. The lungs  are borderline hypoinflated. The interstitial markings remain increased bilaterally. The cardiac silhouette remains enlarged and the pulmonary vascularity remains engorged. The left internal jugular venous catheter tip lies in the region of the proximal to mid SVC.  IMPRESSION: Interval placement of a nasogastric tube reveals reasonable positioning below the level of the GE junction.   Electronically Signed   By: David  Martinique   On: 03/13/2013 15:33   Dg Chest Port 1 View  03/13/2013   CLINICAL DATA:  Endotracheal tube position, shortness of breath.  EXAM: PORTABLE CHEST - 1 VIEW  COMPARISON:  DG CHEST 1V PORT dated 03/12/2013  FINDINGS: Interval extubation. Interval removal of nasogastric tube. Left internal jugular central venous catheter with distal tip projecting in  mid superior vena cava remains.  Stable mild moderate cardiomegaly, status post median sternotomy. Central pulmonary vasculature congestion interstitial prominence, slightly interstitial prominence. No pleural effusions. No pneumothorax.  Multiple EKG lines overlie the patient and may obscure subtle underlying pathology. Soft tissue planes and included osseous structures are unremarkable.  IMPRESSION: Interval extubation removal of nasogastric tube. No apparent change in position of left IJ line.  Stable cardiomegaly and worsening interstitial edema.   Electronically Signed   By: Elon Alas   On: 03/13/2013 05:58   Dg Chest Port 1 View  03/12/2013   CLINICAL DATA:  Assess endotracheal tube  EXAM: PORTABLE CHEST - 1 VIEW  COMPARISON:  DG CHEST 1V PORT dated 03/11/2013  FINDINGS: Endotracheal tube tip projects 4.3 cm above the carina. Left internal jugular central venous catheter with distal tip projecting in proximal superior vena cava. Nasogastric tube past the gastroesophageal junction, distal tip projecting pack at gastric fundus. Multiple EKG lines overlie the patient and may obscure subtle underlying pathology.  Cardiac silhouette  appears mildly enlarged, status post median sternotomy. Similar interstitial prominence in central pulmonary vasculature engorgement. Mild patchy perihilar airspace opacities without pleural effusions. No pneumothorax.  Soft tissue planes and included osseous structures are unremarkable.  IMPRESSION: No apparent change in position of life support lines.  Mild cardiomegaly and interstitial prominence with perihilar airspace opacities concerning for pulmonary edema.   Electronically Signed   By: Elon Alas   On: 03/12/2013 04:57   Dg Chest Port 1 View  03/11/2013   CLINICAL DATA:  Check ETT position.  EXAM: PORTABLE CHEST - 1 VIEW  COMPARISON:  03/10/2013  FINDINGS: Prior CABG. Support devices are stable. Slight increasing perihilar opacities and interstitial prominence, possibly interstitial edema. Heart is borderline in size. No effusions. No acute bony abnormality.  IMPRESSION: Increasing interstitial prominence and perihilar opacities concerning for mild edema.   Electronically Signed   By: Rolm Baptise M.D.   On: 03/11/2013 06:46   Dg Chest Port 1 View  03/10/2013   CLINICAL DATA:  Respiratory failure  EXAM: PORTABLE CHEST - 1 VIEW  COMPARISON:  Prior radiograph from 03/09/2013  FINDINGS: Endotracheal tube terminates well above the carina. Enteric tube courses into the abdomen with tip located in the gastric fundus. Left IJ central venous catheter tip overlies the distal SVC. Cardiac and mediastinal silhouettes unchanged. Median sternotomy wires remain intact.  The lungs are normally inflated. There has been interval improvement in previously seen confluent opacity within the right perihilar region. No overt pulmonary edema. No new focal infiltrate or pneumothorax. No pleural effusion.  IMPRESSION: 1. Lines and tubes in satisfactory position. 2. Interval improvement in the hazy opacity overlying the right mid lung. No new focal infiltrate or overt pulmonary edema identified.   Electronically Signed    By: Jeannine Boga M.D.   On: 03/10/2013 06:09   Dg Chest Port 1 View  03/09/2013   CLINICAL DATA:  Respiratory failure.  EXAM: PORTABLE CHEST - 1 VIEW  COMPARISON:  03/07/2013  FINDINGS: Endotracheal tube has tip approximately 3.5 cm above the carina. Enteric tube courses into the region of the stomach and off the inferior portion of the film. Left IJ central venous catheter is unchanged with tip over the SVC. Lungs are adequately inflated with slight worsening hazy opacification over the central right lung. No evidence of effusion or pneumothorax. Cardiomediastinal silhouette and remainder the exam is unchanged.  IMPRESSION: Slight worsening hazy opacification over the central right lung which may be  due to asymmetric edema or infection.  Tubes and lines as described.   Electronically Signed   By: Marin Olp M.D.   On: 03/09/2013 08:46   Dg Chest Port 1 View  03/07/2013   CLINICAL DATA:  Cardiac arrest.  Central line placement.  EXAM: PORTABLE CHEST - 1 VIEW  COMPARISON:  03/07/2013  FINDINGS: Endotracheal tube and NG tube are in good position. Left jugular vein catheter is been inserted the tip is at the cavoatrial junction in good position. No pneumothorax. Left lung remains clear. There is slight haziness in the right perihilar region.  IMPRESSION: 1. Central venous catheter in good position. 2. New slight haziness in the right perihilar region. The possibility of aspiration pneumonitis should be considered.   Electronically Signed   By: Rozetta Nunnery M.D.   On: 03/07/2013 13:12   Dg Chest Portable 1 View  03/07/2013   CLINICAL DATA:  Intubation.  EXAM: PORTABLE CHEST - 1 VIEW  COMPARISON:  None.  FINDINGS: Endotracheal tube tip in satisfactory position projecting approximately 4 cm above the carina. Nasogastric tube tip in the distal esophagus just above the EG junction. Prior sternotomy for CABG. Cardiac silhouette upper normal in size to slightly enlarged for AP portable technique. Linear  atelectasis or scar in the lingula. Lungs otherwise clear. No localized airspace consolidation. No pleural effusions. No pneumothorax. Normal pulmonary vascularity.  IMPRESSION: 1. Endotracheal tube tip in satisfactory position projecting approximately 4 cm above the carina. 2. Nasogastric tube tip in the distal esophagus just above the EG junction. This should be advanced several cm. 3. Borderline heart size. Linear atelectasis or scar in the lingula. No acute cardiopulmonary disease otherwise.   Electronically Signed   By: Evangeline Dakin M.D.   On: 03/07/2013 11:59   Dg Abd Portable 1v  03/07/2013   CLINICAL DATA:  Nasogastric tube placement  EXAM: PORTABLE ABDOMEN - 1 VIEW  COMPARISON:  Chest x-ray 03/07/2013  FINDINGS: A nasogastric tube is present. The tip and proximal side hole are noted within the gastric fundus. No evidence of bowel obstruction. Contrast material is noted in the right renal collecting system. Mild cardiomegaly and prior median sternotomy are noted. No free air. A right femoral approach central venous catheter is present. The catheter tip projects over the L5 vertebral body likely within the common iliac vein.  IMPRESSION: The nasogastric tube is well positioned in the gastric fundus.  The tip of a right femoral approach central venous catheter projects over the L5 pedicle likely in the common iliac vein.   Electronically Signed   By: Jacqulynn Cadet M.D.   On: 03/07/2013 18:44   Dg Swallowing Func-speech Pathology  03/16/2013   Marcille Buffy, [place]     03/16/2013  9:23 PM Objective Swallowing Evaluation:    Patient Details  Name: Kaleil Hansell MRN: [id number] Date of Birth: 05-Apr-1955  Today's Date: 03/16/2013 Time: 1630-1700 SLP Time Calculation (min): 30 min  Past Medical History:  Past Medical History  Diagnosis Date  . Hypertension   . Hyperlipemia   . 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 Edema    Past Surgical History:  Past Surgical History   Procedure Laterality Date  . Coronary artery bypass graft     HPI:  58 y/o male with PMH for CAD, CABG, DM, HTN admitted to ED  following witnessed seizure at [employer].  Apneic and  pulseless at scene.  CPR x5 minutes, king airway placed by  EMS,  intubated in ED, recurrent V fib arrest in ED required  defibrillation.  CT indicates hydrocephalus with diffuse dilation  to the lateral and third ventricles.  Intubated 2/13 to 2/18.   Postextubation dysphagia suspected following extubation.  BSE  completed on 2/219 with recommendations of NPO.  F/u on 2/20with  recommendations to continue NPO with exception of medication  crushed in puree.  F/u on 2/21 indicates improvement in overall  swallow ability to but continued with reduced ability to protect  airway fully.  Early am on 2/22 desat to low 80's with increased  confusion requiring Bi-Pap.   Per nursing patient maintaining  oxygen saturations with nasal cannula with improved LOA.  MBS  indicated to objectively assess for aspiration and determine  safest, PO diet as patient continues with NPO status.    Assessment / Plan / Recommendation Clinical Impression  Dysphagia Diagnosis: Mild oral phase dysphagia;Mild pharyngeal  phase dysphagia;Mild cervical esophageal phase dysphagia Mild to moderate pharyngeal dysphagia marked mainly by what  appears to laryngeal edema affecting bolus transit.  All swallows  triggered at valleculae space.  Bolus contained at level of C6  prior to completed transit.  Appeared to be artifact at C-6  radiologist present unable to confirm.  Moderate amount of  residuals at level of pyriforms requiring multiple swallows with  use of chin tuck to clear.  Patient did not sense containment.     Due to narrowing of pharynx from what appeared to be edema trials  of soft and regular solids and whole barium tablet deferred.   Brief esophageal screen indicates slow bolus transit with  backflow noted to cervical esophagus with puree and thin liquid   trials.  No penetration or aspiration events occurred.  Recommend  to initiate dysphagia 1 (puree)  diet consistency and thin liquid  with full supervision with all PO's due to noted cognitive  deficits.  Recommend strict aspiration precautions and use of  swallow strategies as aspiration risk remains high s/p swallow.   Diagnostic treatment completed following study focusing on diet  recommendations and necessary swallow strategies to nursing.  ST  to follow closely in acute care  for diet tolerance.  Recommend  repeat MBS with clinical improvement prior to diet upgrade.      Treatment Recommendation       Diet Recommendation Dysphagia 1 (Puree);No mixed  consistencies;Thin liquid   Liquid Administration via: Cup;Straw Medication Administration: Crushed with puree Supervision: Full supervision/cueing for compensatory strategies Compensations: Multiple dry swallows after each  bite/sip;Effortful swallow Postural Changes and/or Swallow Maneuvers: Seated upright 90  degrees;Upright 30-60 min after meal;Chin tuck    Other  Recommendations Oral Care Recommendations: Oral care Q4  per protocol   Follow Up Recommendations  Inpatient Rehab    Frequency and Duration min 2x/week  2 weeks   Pertinent Vitals/Pain Oxygen saturations lower 90's on 6 L RR   Lower 20's    SLP Swallow Goals     General Date of Onset: 03/07/13 HPI: 58 y/o male with PMH for CAD, CABG, DM, HTN admitted to ED  following witnessed seizure at [employer].  Apneic and  pulseless at scene.  CPR x5 minutes, king airway placed by EMS,  intubated in ED, recurrent V fib aresst in ED required  defibrillation.  CT indicates hydrocephalus with diffuse dilation  to the lateral and third verntricles.  Intubated 2/13 to 2/18.   Postextubation dysphagia suspected following extubation.  BSE  completed on 2/20 with recommendations  of NPO.  F/u on 2/21 with  recommendations to continue NPO with exception of medication  crushed in puree.  Improvement noted in swallow  ability on 2/21     Reason for Referral     Oral Phase Oral Preparation/Oral Phase Oral Phase: Impaired Oral - Thin Oral - Thin Teaspoon: Piecemeal swallowing;Holding of  bolus;Reduced posterior propulsion Oral - Thin Cup: Piecemeal swallowing;Holding of bolus;Reduced  posterior propulsion Oral - Thin Straw: Reduced posterior propulsion Oral - Solids Oral - Puree: Piecemeal swallowing;Reduced posterior propulsion Oral - Mechanical Soft: Not tested   Pharyngeal Phase Pharyngeal Phase Pharyngeal Phase: Impaired Pharyngeal - Nectar Pharyngeal - Nectar Teaspoon: Reduced pharyngeal  peristalsis;Reduced anterior laryngeal mobility;Reduced laryngeal  elevation;Reduced airway/laryngeal closure;Pharyngeal residue -  pyriform sinuses Pharyngeal - Thin Pharyngeal - Thin Teaspoon: Reduced pharyngeal  peristalsis;Reduced tongue base retraction;Reduced anterior  laryngeal mobility;Reduced laryngeal elevation;Reduced  airway/laryngeal closure;Pharyngeal residue - pyriform sinuses Pharyngeal - Thin Cup: Reduced pharyngeal peristalsis;Reduced  tongue base retraction;Reduced anterior laryngeal  mobility;Reduced laryngeal elevation;Reduced airway/laryngeal  closure;Pharyngeal residue - pyriform sinuses Pharyngeal - Thin Straw: Reduced pharyngeal peristalsis;Reduced  anterior laryngeal mobility;Reduced laryngeal elevation;Reduced  airway/laryngeal closure;Pharyngeal residue - pyriform sinuses Pharyngeal - Solids Pharyngeal - Puree: Reduced pharyngeal peristalsis;Reduced tongue  base retraction;Reduced anterior laryngeal mobility;Reduced  laryngeal elevation;Reduced airway/laryngeal closure;Pharyngeal  residue - pyriform sinuses;Pharyngeal residue - cp segment  Cervical Esophageal Phase    GO    Cervical Esophageal Phase Cervical Esophageal Phase: Impaired Cervical Esophageal Phase - Thin Thin Straw: Prominent cricopharyngeal segment;Esophageal backflow  into cervical esophagus Cervical Esophageal Phase - Solids Puree: Prominent  cricopharyngeal segment;Esophageal backflow into  cervical esophagus        Sharman Crate MS, CCC-SLP [phone] [facility] 03/16/2013, 8:57 PM     Medications: Scheduled Meds: . amLODipine  10 mg Oral Daily  . antiseptic oral rinse  15 mL Mouth Rinse BID  . aspirin  81 mg Oral Daily  . atorvastatin  40 mg Oral q1800  . heparin subcutaneous  5,000 Units Subcutaneous 3 times per day  . insulin aspart  0-9 Units Subcutaneous TID WC  . insulin aspart  3 Units Subcutaneous TID WC  . isosorbide mononitrate  60 mg Oral Daily  . lisinopril  20 mg Oral BID  . metoprolol tartrate  75 mg Oral TID  . potassium chloride  40 mEq Oral BID  . QUEtiapine  12.5 mg Oral QHS  . spironolactone  25 mg Oral Daily      LOS: 15 days   Jodelle Fausto M.D. [facility] 03/22/2013, 1:24 PM Pager: [phone]  If 7PM-7AM, please contact night-coverage [email or web address] Password [email or web address]

## 2013-03-23 DIAGNOSIS — I219 Acute myocardial infarction, unspecified: Secondary | ICD-10-CM

## 2013-03-23 DIAGNOSIS — Z8679 Personal history of other diseases of the circulatory system: Secondary | ICD-10-CM

## 2013-03-23 HISTORY — DX: Personal history of other diseases of the circulatory system: Z86.79

## 2013-03-23 HISTORY — DX: Acute myocardial infarction, unspecified: I21.9

## 2013-03-23 LAB — GLUCOSE, CAPILLARY
GLUCOSE-CAPILLARY: 162 mg/dL — AB (ref 70–99)
GLUCOSE-CAPILLARY: 219 mg/dL — AB (ref 70–99)
GLUCOSE-CAPILLARY: 124 mg/dL — AB (ref 70–99)
Glucose-Capillary: 160 mg/dL — ABNORMAL HIGH (ref 70–99)

## 2013-03-23 MED ORDER — SODIUM CHLORIDE 0.9 % IV SOLN
INTRAVENOUS | Status: DC
  Administered 2013-03-24: 07:00:00 via INTRAVENOUS

## 2013-03-23 MED ORDER — SODIUM CHLORIDE 0.9 % IR SOLN
80.0000 mg | Status: DC
  Filled 2013-03-23: qty 2

## 2013-03-23 MED ORDER — CEFAZOLIN SODIUM-DEXTROSE 2-3 GM-% IV SOLR
2.0000 g | INTRAVENOUS | Status: DC
  Filled 2013-03-23: qty 50

## 2013-03-23 MED ORDER — SODIUM CHLORIDE 0.9 % IV SOLN: INTRAVENOUS | Status: DC

## 2013-03-23 MED ORDER — CHLORHEXIDINE GLUCONATE 4 % EX LIQD
Freq: Once | CUTANEOUS | Status: AC
  Administered 2013-03-24: 05:00:00 via TOPICAL
  Filled 2013-03-23: qty 60

## 2013-03-23 NOTE — Progress Notes (Signed)
Patient Name: Eric [name]      SUBJECTIVE: s/p cardiac areest in setting of known CAD and prior CABG  Cath severe native disease and patent grafts   Echo EF 55-60& Limited understanding of events but alert oriented and appropriate  Past Medical History  Diagnosis Date  . Hypertension   . Hyperlipemia   . 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 Edema     Scheduled Meds:  Scheduled Meds: . amLODipine  10 mg Oral Daily  . antiseptic oral rinse  15 mL Mouth Rinse BID  . aspirin  81 mg Oral Daily  . atorvastatin  40 mg Oral q1800  . heparin subcutaneous  5,000 Units Subcutaneous 3 times per day  . insulin aspart  0-9 Units Subcutaneous TID WC  . insulin aspart  3 Units Subcutaneous TID WC  . isosorbide mononitrate  60 mg Oral Daily  . lisinopril  20 mg Oral BID  . metoprolol tartrate  75 mg Oral TID  . QUEtiapine  12.5 mg Oral QHS  . spironolactone  25 mg Oral Daily   Continuous Infusions: . sodium chloride 10 mL/hr at 03/17/13 1500    PHYSICAL EXAM Filed Vitals:   03/22/13 2100 03/22/13 2132 03/23/13 0500 03/23/13 1042  BP: 119/64 121/71 121/69 127/68  Pulse: 68 75 71 76  Temp: 98.2 F (36.8 C)  98.3 F (36.8 C)   TempSrc:      Resp: 16  16   Height:      Weight:   159 lb (72.122 kg)   SpO2: 100%  97%     General appearance: alert, cooperative and no distress Neck: no JVD and IJ line in place Lungs: clear to auscultation bilaterally Heart: regular rate and rhythm, S1, S2 normal, 2/6 early murmur, click, rub or gallop Abdomen: soft, non-tender; bowel sounds normal; no masses,  no organomegaly Extremities: extremities normal, atraumatic, no cyanosis or edema Skin: Skin color, texture, turgor normal. No rashes or lesions Neurologic: Alert and oriented X 3, normal strength and tone. Normal symmetric reflexes. Normal coordination and gait  TELEMETRY: Reviewed telemetry pt in NSR:    Intake/Output Summary (Last 24 hours) at  03/23/13 1333 Last data filed at 03/23/13 1145  Gross per 24 hour  Intake    780 ml  Output   1826 ml  Net  -1046 ml    LABS: Basic Metabolic Panel:  Recent Labs Lab 03/17/13 0400 03/17/13 1735 03/18/13 0439 03/19/13 0421 03/20/13 0520 03/21/13 0335  NA 148* 140 140 138 134* 136*  K 3.0* 3.5* 3.4* 3.7 4.4 4.8  CL 109 103 103 102 99 102  CO2 29 27 26 28 27 26   GLUCOSE 104* 133* 115* 164* 164* 149*  BUN 26* 25* 20 22 21 21   CREATININE 1.02 0.95 0.88 0.92 0.87 0.83  CALCIUM 8.4 8.0* 8.1* 8.1* 8.2* 8.7  MG  --  2.1  --   --   --   --    Cardiac Enzymes: No results found for this basename: CKTOTAL, CKMB, CKMBINDEX, TROPONINI,  in the last 72 hours CBC:  Recent Labs Lab 03/17/13 1735 03/18/13 0439 03/19/13 0421  WBC 9.9 7.1 7.2  HGB 8.1* 8.5* 8.1*  HCT 24.0* 24.5* 24.0*  MCV 92.0 91.4 91.6  PLT 212 210 212   PROTIME: No results found for this basename: LABPROT, INR,  in the last 72 hours Liver Function Tests: No results found for this basename:  AST, ALT, ALKPHOS, BILITOT, PROT, ALBUMIN,  in the last 72 hours No results found for this basename: LIPASE, AMYLASE,  in the last 72 hours BNP: BNP (last 3 results)  Recent Labs  03/11/13 0340  PROBNP 370.5*   D-Dimer: No results found for this basename: DDIMER,  in the last 72 hours  Echo normal Electrolytes normal on arrival ECG normal d6  ASSESSMENT AND PLAN:  Active Problems:   Cardiac arrest   Seizure   Acute respiratory failure with hypoxia   Ventricular fibrillation  The patient aborted cardiac arrest in the absence of reversible problems in the context of known coronary artery disease albeit with normal LV function. Is a class I indication to pursue ICD implantation for secondary prevention. We will plan in am  Have reviewed the potential benefits and risks of ICD implantation including but not limited to death, perforation of heart or lung, lead dislodgement, infection,  device malfunction and  inappropriate shocks.  The patient  express understanding  and are willing to proceed.     Signed, [name] MD  03/23/2013

## 2013-03-23 NOTE — Progress Notes (Signed)
Patient ID: Eric [name]  male  [id number]    DOB: February 05, 1955    DOA: 03/07/2013  PCP: Wendie Simmer, MD  Admit HPI / Brief Narrative:  58 yo with witnessed convulstions at a [employer]. Apneic and pulseless at scene. CPR x 5 minutes , king airway placed by EMS, intubated in ED. Receives his care in [place]. 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    Assessment/Plan:  Acute respiratory failure with Hypoxia due to acute pulmonary edema improving - Weaned off of O2, O2 sats 100% on room air  - Chest x-ray on 2/24 worsening of the bilateral airspace disease due to progressive edema/pneumonia  - finished IV Lasix , continue spironolactone, cardiology following, -14.8 L output   Recurrent VF arrest  - likely the primary event leading to his collapse prior to presentation  - Cardiology following - EP planning ICD insertion over the next few days (per Dr. Tanna Furry note on 2/22, just prior to rehab admit), patient does not want rehab. Patient is doing very well for inpatient rehabilitation stay, they recommended home health services or SNF. D/w Dr Caryl Comes (EP service) who will evaluate today  schedule him for ICD tomorrow if possible.  -2-D echo on 2/20  showed EF  55-60% w/ no WMA - amiodarone dc'd   VDRF post arrest  -Extubated 2/18 - no evidence of primary pulmonary issues   Sinus bradycardia on amiodarone  -Resolved with discontinuation of amiodarone  Encephalopathy / ? sundowning - significantly improved -secondary to his anoxic event possibly coupled with ICU delirium -  cont scheduled seroquel  - Swallow evaluation done, recommended dysphagia 3 mechanical soft diet with thin liquids  Uncontrolled Hypertension  - Recent issues with increased BP with agitation leading to flash pulm edema - BP currently stable on Norvasc, Lasix, spironolactone, Imdur, lisinopril , beta blocker  Hypernatremia  -resolved   Hypokalemia resolved   Anemia of critical  illness  -Keep Hgb 8.0 or > - stable at present   Mild thrombocytopenia  -Resolved   Klebsiella in sputum  -Treated with unasyn - afeb - WBC normal - completed 7 days of tx 2/25   DM2  -Reasonably controlled at present, added meal coverage  Hydrocephalus  -Appears congenital on MRI - no further interventions at this time   Seizures  -Not clear if seizure > arrest vs arrest > convulsive syncope or seizure - significant doubt that this represents seizure as a primary event - not on AED  - EEG w/o epileptiform activity   Nutrition  -Currently tolerating diet w/o difficulty   DVT Prophylaxis: Heparin subcutaneous  Code Status: Full code  Family Communication:  Disposition:  Consultants: Cardiology EP cardiology Inpatient rehabilitation  Neurology  Antibiotics: Vancomycin 2/17>>>2/19  Zosyn 2/17>>>2/19   Unasyn 2/19>>> 2/25  Subjective: No complaints, no chest pain or shortness of breath, awaiting ICD placement. wants the Foley to be removed   Objective: Weight change: 0.635 kg (1 lb 6.4 oz)  Intake/Output Summary (Last 24 hours) at 03/23/13 1056 Last data filed at 03/23/13 0500  Gross per 24 hour  Intake   1030 ml  Output   2601 ml  Net  -1571 ml   Blood pressure 127/68, pulse 76, temperature 98.3 F (36.8 C), temperature source Oral, resp. rate 16, height 6' (1.829 m), weight 72.122 kg (159 lb), SpO2 97.00%.  Physical Exam: General: Alert and awake, oriented, not in any acute distress. CVS: S1-S2 clear, no murmur rubs or gallops Chest: clear  to auscultation bilaterally, no wheezing, rales or rhonchi Abdomen: soft nontender, nondistended, normal bowel sounds  Extremities: no cyanosis, clubbing or edema noted bilaterally Neuro: Cranial nerves II-XII intact, no focal neurological deficits  Lab Results: Basic Metabolic Panel:  Recent Labs Lab 03/17/13 1735  03/20/13 0520 03/21/13 0335  NA 140  < > 134* 136*  K 3.5*  < > 4.4 4.8  CL 103  < > 99 102   CO2 27  < > 27 26  GLUCOSE 133*  < > 164* 149*  BUN 25*  < > 21 21  CREATININE 0.95  < > 0.87 0.83  CALCIUM 8.0*  < > 8.2* 8.7  MG 2.1  --   --   --   < > = values in this interval not displayed. Liver Function Tests:  Recent Labs Lab 03/17/13 1735  AST 64*  ALT 61*  ALKPHOS 58  BILITOT 0.5  PROT 7.2  ALBUMIN 2.0*   No results found for this basename: LIPASE, AMYLASE,  in the last 168 hours No results found for this basename: AMMONIA,  in the last 168 hours CBC:  Recent Labs Lab 03/18/13 0439 03/19/13 0421  WBC 7.1 7.2  HGB 8.5* 8.1*  HCT 24.5* 24.0*  MCV 91.4 91.6  PLT 210 212   Cardiac Enzymes: No results found for this basename: CKTOTAL, CKMB, CKMBINDEX, TROPONINI,  in the last 168 hours BNP: No components found with this basename: POCBNP,  CBG:  Recent Labs Lab 03/22/13 0753 03/22/13 1129 03/22/13 1629 03/22/13 2050 03/23/13 0807  GLUCAP 98 215* 142* 171* 162*     Micro Results: No results found for this or any previous visit (from the past 240 hour(s)).  Studies/Results: Ct Head (brain) Wo Contrast  03/07/2013   CLINICAL DATA:  Weakness seizure, became pulseless and apneic, post CPR, history hypertension, diabetes  EXAM: CT HEAD WITHOUT CONTRAST  CT CERVICAL SPINE WITHOUT CONTRAST  TECHNIQUE: Multidetector CT imaging of the head and cervical spine was performed following the standard protocol without intravenous contrast. Multiplanar CT image reconstructions of the cervical spine were also generated.  COMPARISON:  None  FINDINGS: CT HEAD FINDINGS  Motion artifacts, for which repeat imaging was performed.  Diffuse dilatation of the lateral and third ventricles compatible of hydrocephalus.  Fourth rectal decompressed.  Scattered fall seen calcification.  No midline shift or mass effect.  Otherwise normal appearance of brain parenchyma.  No intracranial hemorrhage, mass lesion or evidence acute infarction.  No extra-axial fluid collections.  Beam hardening  artifacts of dental origin.  Bones and sinuses unremarkable.  CT CERVICAL SPINE FINDINGS  Visualized skullbase intact.  Osseous mineralization normal.  Endotracheal and nasogastric tubes, with nasogastric tube coiled in pharynx.  Question infiltrate right apex.  Vertebral body and disc space heights maintained.  Prevertebral soft tissues normal thickness.  No acute fracture, subluxation or bone destruction.  IMPRESSION: Hydrocephalus, with diffuse dilatation of the lateral and third ventricles.  Lack of fourth ventricular dilatation raises question of aquaductal stenosis; consider followup MR imaging of the brain with and without contrast to assess.  No other intracranial abnormalities.  No acute cervical spine abnormalities.  Nasogastric tube coiled in pharynx - this finding was called to [facility] RN 2-Heart at 1418 hr on 03/07/2013.  Question right upper lobe infiltrate.   Electronically Signed   By: Lavonia Dana M.D.   On: 03/07/2013 14:19   Ct Cervical Spine Wo Contrast  03/07/2013   CLINICAL DATA:  Weakness seizure, became pulseless  and apneic, post CPR, history hypertension, diabetes  EXAM: CT HEAD WITHOUT CONTRAST  CT CERVICAL SPINE WITHOUT CONTRAST  TECHNIQUE: Multidetector CT imaging of the head and cervical spine was performed following the standard protocol without intravenous contrast. Multiplanar CT image reconstructions of the cervical spine were also generated.  COMPARISON:  None  FINDINGS: CT HEAD FINDINGS  Motion artifacts, for which repeat imaging was performed.  Diffuse dilatation of the lateral and third ventricles compatible of hydrocephalus.  Fourth rectal decompressed.  Scattered fall seen calcification.  No midline shift or mass effect.  Otherwise normal appearance of brain parenchyma.  No intracranial hemorrhage, mass lesion or evidence acute infarction.  No extra-axial fluid collections.  Beam hardening artifacts of dental origin.  Bones and sinuses unremarkable.  CT CERVICAL SPINE FINDINGS   Visualized skullbase intact.  Osseous mineralization normal.  Endotracheal and nasogastric tubes, with nasogastric tube coiled in pharynx.  Question infiltrate right apex.  Vertebral body and disc space heights maintained.  Prevertebral soft tissues normal thickness.  No acute fracture, subluxation or bone destruction.  IMPRESSION: Hydrocephalus, with diffuse dilatation of the lateral and third ventricles.  Lack of fourth ventricular dilatation raises question of aquaductal stenosis; consider followup MR imaging of the brain with and without contrast to assess.  No other intracranial abnormalities.  No acute cervical spine abnormalities.  Nasogastric tube coiled in pharynx - this finding was called to [facility] RN 2-Heart at 1418 hr on 03/07/2013.  Question right upper lobe infiltrate.   Electronically Signed   By: Lavonia Dana M.D.   On: 03/07/2013 14:19   Mr Jeri Cos [id number] Contrast  03/13/2013   CLINICAL DATA:  Recent cardiac arrest. Seizure and altered mental status. Ventriculomegaly on CT.  EXAM: MRI HEAD WITHOUT AND WITH CONTRAST  TECHNIQUE: Multiplanar, multiecho pulse sequences of the brain and surrounding structures were obtained without and with intravenous contrast.  CONTRAST:  50mL MULTIHANCE GADOBENATE DIMEGLUMINE 529 MG/ML IV SOLN  COMPARISON:  Head CT 03/07/2013  FINDINGS: Images are moderately degraded by motion artifact. The genu and anterior body of the corpus callosum are present, however the posterior body and splenium are not clearly identified. There is dilatation of the lateral and third ventricles, with marked dilatation of the atria of the lateral ventricles. The fourth ventricle is nondilated. The cerebral aqueduct appears grossly patent without tectal region mass seen. Periventricular T2 hyperintensities suggestive of transependymal CSF flow is identified.  There is no evidence of acute infarct. There is no evidence of mass, midline shift, or extra-axial fluid collection. Small foci of T2  hyperintensity within the cerebral white matter are nonspecific but compatible with mild chronic small vessel ischemic disease. Small foci of susceptibility artifact are seen in the anterior right frontal lobe and at the margin of the right frontal horn, compatible with remote hemorrhage. There is also evidence of a small amount of remote hemorrhage in the region of the right basal ganglia. No gross abnormal enhancement is identified.  Major intracranial vascular flow voids are grossly preserved. Orbits are unremarkable. Paranasal sinuses and mastoid air cells are clear.  IMPRESSION: 1. Moderate motion artifact. No evidence of acute intracranial abnormality. 2. Ventricular dilatation, which is felt to be congenital/developmental rather than representing acute hydrocephalus, with corpus callosum dysgenesis. These results were called by telephone at the time of interpretation on 03/13/2013 at 7:20 PM to Dr. Leonel Ramsay, who verbally acknowledged these results.   Electronically Signed   By: Logan Bores   On: 03/13/2013 19:46   Dg  Chest Port 1 View  03/18/2013   CLINICAL DATA:  Seizure. Encephalopathy and seizure. Uncontrolled hypertension. Hypoxia.  EXAM: PORTABLE CHEST - 1 VIEW  COMPARISON:  Single view of the chest 03/17/2013 and 03/15/2013.  FINDINGS: Left IJ catheter remains in place. Bilateral airspace seen on yesterday's examination has markedly worsened. There are likely small bilateral pleural effusions. Cardiomegaly is noted.  IMPRESSION: Marked worsening bilateral airspace disease could be due to progressive edema or pneumonia.   Electronically Signed   By: Inge Rise M.D.   On: 03/18/2013 07:44   Dg Chest Port 1 View  03/17/2013   CLINICAL DATA:  58 year old male with acute respiratory failure. Cardiac arrest. Initial encounter.  EXAM: PORTABLE CHEST - 1 VIEW  COMPARISON:  03/15/2013 and earlier.  FINDINGS: Portable AP upright view at 0736 hr. Stable left IJ central line. Stable cardiac size and  mediastinal contours. No pneumothorax. No pleural effusion identified. Decreased widespread indistinct pulmonary opacity, residual now mostly in both upper lobes. No areas of worsening ventilation.  IMPRESSION: Regressed bilateral indistinct pulmonary opacity, favor resolving pulmonary edema. Residual in both upper lungs.   Electronically Signed   By: Lars Pinks M.D.   On: 03/17/2013 08:09   Dg Chest Port 1 View  03/15/2013   CLINICAL DATA:  Hypoxia.  Follow-up pulmonary edema.  EXAM: PORTABLE CHEST - 1 VIEW  COMPARISON:  DG CHEST 1V PORT dated 03/14/2013; DG CHEST 1V PORT dated 03/13/2013; DG CHEST 1V PORT dated 03/13/2013; DG CHEST 1V PORT dated 03/12/2013  FINDINGS: Prior sternotomy for CABG. Cardiac silhouette moderately enlarged but stable. Interval worsening of interstitial and airspace opacities throughout both lungs. Small bilateral pleural effusions suspected. Left jugular central venous catheter tip projects over the lower SVC, unchanged.  IMPRESSION: Interval worsening of now severe interstitial and airspace pulmonary edema throughout both lungs.   Electronically Signed   By: Evangeline Dakin M.D.   On: 03/15/2013 19:33   Dg Chest Port 1 View  03/14/2013   CLINICAL DATA:  Central line placement.  EXAM: PORTABLE CHEST - 1 VIEW  COMPARISON:  03/13/2013  FINDINGS: Prior CABG. Left central line is in place with the tip at the cavoatrial junction. No pneumothorax.  Cardiomegaly with vascular congestion and bilateral airspace opacities, likely edema. No real change.  IMPRESSION: Left central line tip at the cavoatrial junction.  No pneumothorax.  Bilateral airspace disease, likely edema, unchanged.   Electronically Signed   By: Rolm Baptise M.D.   On: 03/14/2013 05:09   Dg Chest Port 1 View  03/13/2013   CLINICAL DATA:  Status post nasogastric tube placement  EXAM: PORTABLE CHEST - 1 VIEW  COMPARISON:  DG CHEST 1V PORT dated 03/13/2013  FINDINGS: The nasogastric tube tip and proximal port lie well below the  GE junction. The tube is coiled upon itself within the gastric fundus. The lungs are borderline hypoinflated. The interstitial markings remain increased bilaterally. The cardiac silhouette remains enlarged and the pulmonary vascularity remains engorged. The left internal jugular venous catheter tip lies in the region of the proximal to mid SVC.  IMPRESSION: Interval placement of a nasogastric tube reveals reasonable positioning below the level of the GE junction.   Electronically Signed   By: David  Martinique   On: 03/13/2013 15:33   Dg Chest Port 1 View  03/13/2013   CLINICAL DATA:  Endotracheal tube position, shortness of breath.  EXAM: PORTABLE CHEST - 1 VIEW  COMPARISON:  DG CHEST 1V PORT dated 03/12/2013  FINDINGS: Interval extubation.  Interval removal of nasogastric tube. Left internal jugular central venous catheter with distal tip projecting in mid superior vena cava remains.  Stable mild moderate cardiomegaly, status post median sternotomy. Central pulmonary vasculature congestion interstitial prominence, slightly interstitial prominence. No pleural effusions. No pneumothorax.  Multiple EKG lines overlie the patient and may obscure subtle underlying pathology. Soft tissue planes and included osseous structures are unremarkable.  IMPRESSION: Interval extubation removal of nasogastric tube. No apparent change in position of left IJ line.  Stable cardiomegaly and worsening interstitial edema.   Electronically Signed   By: Elon Alas   On: 03/13/2013 05:58   Dg Chest Port 1 View  03/12/2013   CLINICAL DATA:  Assess endotracheal tube  EXAM: PORTABLE CHEST - 1 VIEW  COMPARISON:  DG CHEST 1V PORT dated 03/11/2013  FINDINGS: Endotracheal tube tip projects 4.3 cm above the carina. Left internal jugular central venous catheter with distal tip projecting in proximal superior vena cava. Nasogastric tube past the gastroesophageal junction, distal tip projecting pack at gastric fundus. Multiple EKG lines overlie  the patient and may obscure subtle underlying pathology.  Cardiac silhouette appears mildly enlarged, status post median sternotomy. Similar interstitial prominence in central pulmonary vasculature engorgement. Mild patchy perihilar airspace opacities without pleural effusions. No pneumothorax.  Soft tissue planes and included osseous structures are unremarkable.  IMPRESSION: No apparent change in position of life support lines.  Mild cardiomegaly and interstitial prominence with perihilar airspace opacities concerning for pulmonary edema.   Electronically Signed   By: Elon Alas   On: 03/12/2013 04:57   Dg Chest Port 1 View  03/11/2013   CLINICAL DATA:  Check ETT position.  EXAM: PORTABLE CHEST - 1 VIEW  COMPARISON:  03/10/2013  FINDINGS: Prior CABG. Support devices are stable. Slight increasing perihilar opacities and interstitial prominence, possibly interstitial edema. Heart is borderline in size. No effusions. No acute bony abnormality.  IMPRESSION: Increasing interstitial prominence and perihilar opacities concerning for mild edema.   Electronically Signed   By: Rolm Baptise M.D.   On: 03/11/2013 06:46   Dg Chest Port 1 View  03/10/2013   CLINICAL DATA:  Respiratory failure  EXAM: PORTABLE CHEST - 1 VIEW  COMPARISON:  Prior radiograph from 03/09/2013  FINDINGS: Endotracheal tube terminates well above the carina. Enteric tube courses into the abdomen with tip located in the gastric fundus. Left IJ central venous catheter tip overlies the distal SVC. Cardiac and mediastinal silhouettes unchanged. Median sternotomy wires remain intact.  The lungs are normally inflated. There has been interval improvement in previously seen confluent opacity within the right perihilar region. No overt pulmonary edema. No new focal infiltrate or pneumothorax. No pleural effusion.  IMPRESSION: 1. Lines and tubes in satisfactory position. 2. Interval improvement in the hazy opacity overlying the right mid lung. No new  focal infiltrate or overt pulmonary edema identified.   Electronically Signed   By: Jeannine Boga M.D.   On: 03/10/2013 06:09   Dg Chest Port 1 View  03/09/2013   CLINICAL DATA:  Respiratory failure.  EXAM: PORTABLE CHEST - 1 VIEW  COMPARISON:  03/07/2013  FINDINGS: Endotracheal tube has tip approximately 3.5 cm above the carina. Enteric tube courses into the region of the stomach and off the inferior portion of the film. Left IJ central venous catheter is unchanged with tip over the SVC. Lungs are adequately inflated with slight worsening hazy opacification over the central right lung. No evidence of effusion or pneumothorax. Cardiomediastinal silhouette and remainder the exam  is unchanged.  IMPRESSION: Slight worsening hazy opacification over the central right lung which may be due to asymmetric edema or infection.  Tubes and lines as described.   Electronically Signed   By: Marin Olp M.D.   On: 03/09/2013 08:46   Dg Chest Port 1 View  03/07/2013   CLINICAL DATA:  Cardiac arrest.  Central line placement.  EXAM: PORTABLE CHEST - 1 VIEW  COMPARISON:  03/07/2013  FINDINGS: Endotracheal tube and NG tube are in good position. Left jugular vein catheter is been inserted the tip is at the cavoatrial junction in good position. No pneumothorax. Left lung remains clear. There is slight haziness in the right perihilar region.  IMPRESSION: 1. Central venous catheter in good position. 2. New slight haziness in the right perihilar region. The possibility of aspiration pneumonitis should be considered.   Electronically Signed   By: Rozetta Nunnery M.D.   On: 03/07/2013 13:12   Dg Chest Portable 1 View  03/07/2013   CLINICAL DATA:  Intubation.  EXAM: PORTABLE CHEST - 1 VIEW  COMPARISON:  None.  FINDINGS: Endotracheal tube tip in satisfactory position projecting approximately 4 cm above the carina. Nasogastric tube tip in the distal esophagus just above the EG junction. Prior sternotomy for CABG. Cardiac  silhouette upper normal in size to slightly enlarged for AP portable technique. Linear atelectasis or scar in the lingula. Lungs otherwise clear. No localized airspace consolidation. No pleural effusions. No pneumothorax. Normal pulmonary vascularity.  IMPRESSION: 1. Endotracheal tube tip in satisfactory position projecting approximately 4 cm above the carina. 2. Nasogastric tube tip in the distal esophagus just above the EG junction. This should be advanced several cm. 3. Borderline heart size. Linear atelectasis or scar in the lingula. No acute cardiopulmonary disease otherwise.   Electronically Signed   By: Evangeline Dakin M.D.   On: 03/07/2013 11:59   Dg Abd Portable 1v  03/07/2013   CLINICAL DATA:  Nasogastric tube placement  EXAM: PORTABLE ABDOMEN - 1 VIEW  COMPARISON:  Chest x-ray 03/07/2013  FINDINGS: A nasogastric tube is present. The tip and proximal side hole are noted within the gastric fundus. No evidence of bowel obstruction. Contrast material is noted in the right renal collecting system. Mild cardiomegaly and prior median sternotomy are noted. No free air. A right femoral approach central venous catheter is present. The catheter tip projects over the L5 vertebral body likely within the common iliac vein.  IMPRESSION: The nasogastric tube is well positioned in the gastric fundus.  The tip of a right femoral approach central venous catheter projects over the L5 pedicle likely in the common iliac vein.   Electronically Signed   By: Jacqulynn Cadet M.D.   On: 03/07/2013 18:44   Dg Swallowing Func-speech Pathology  03/16/2013   Marcille Buffy, Carl     03/16/2013  9:23 PM Objective Swallowing Evaluation:    Patient Details  Name: Rowen Beichler MRN: [id number] Date of Birth: 07-11-1955  Today's Date: 03/16/2013 Time: 1630-1700 SLP Time Calculation (min): 30 min  Past Medical History:  Past Medical History  Diagnosis Date  . Hypertension   . Hyperlipemia   . Diabetes mellitus without complication      insulin dependant  . Edema    Past Surgical History:  Past Surgical History  Procedure Laterality Date  . Coronary artery bypass graft     HPI:  58 y/o male with PMH for CAD, CABG, DM, HTN admitted to ED  following witnessed seizure at local  [employer].  Apneic and  pulseless at scene.  CPR x5 minutes, king airway placed by EMS,  intubated in ED, recurrent V fib arrest in ED required  defibrillation.  CT indicates hydrocephalus with diffuse dilation  to the lateral and third ventricles.  Intubated 2/13 to 2/18.   Postextubation dysphagia suspected following extubation.  BSE  completed on 2/219 with recommendations of NPO.  F/u on 2/20with  recommendations to continue NPO with exception of medication  crushed in puree.  F/u on 2/21 indicates improvement in overall  swallow ability to but continued with reduced ability to protect  airway fully.  Early am on 2/22 desat to low 80's with increased  confusion requiring Bi-Pap.   Per nursing patient maintaining  oxygen saturations with nasal cannula with improved LOA.  MBS  indicated to objectively assess for aspiration and determine  safest, PO diet as patient continues with NPO status.    Assessment / Plan / Recommendation Clinical Impression  Dysphagia Diagnosis: Mild oral phase dysphagia;Mild pharyngeal  phase dysphagia;Mild cervical esophageal phase dysphagia Mild to moderate pharyngeal dysphagia marked mainly by what  appears to laryngeal edema affecting bolus transit.  All swallows  triggered at valleculae space.  Bolus contained at level of C6  prior to completed transit.  Appeared to be artifact at C-6  radiologist present unable to confirm.  Moderate amount of  residuals at level of pyriforms requiring multiple swallows with  use of chin tuck to clear.  Patient did not sense containment.     Due to narrowing of pharynx from what appeared to be edema trials  of soft and regular solids and whole barium tablet deferred.   Brief esophageal screen indicates slow bolus  transit with  backflow noted to cervical esophagus with puree and thin liquid  trials.  No penetration or aspiration events occurred.  Recommend  to initiate dysphagia 1 (puree)  diet consistency and thin liquid  with full supervision with all PO's due to noted cognitive  deficits.  Recommend strict aspiration precautions and use of  swallow strategies as aspiration risk remains high s/p swallow.   Diagnostic treatment completed following study focusing on diet  recommendations and necessary swallow strategies to nursing.  ST  to follow closely in acute care  for diet tolerance.  Recommend  repeat MBS with clinical improvement prior to diet upgrade.      Treatment Recommendation       Diet Recommendation Dysphagia 1 (Puree);No mixed  consistencies;Thin liquid   Liquid Administration via: Cup;Straw Medication Administration: Crushed with puree Supervision: Full supervision/cueing for compensatory strategies Compensations: Multiple dry swallows after each  bite/sip;Effortful swallow Postural Changes and/or Swallow Maneuvers: Seated upright 90  degrees;Upright 30-60 min after meal;Chin tuck    Other  Recommendations Oral Care Recommendations: Oral care Q4  per protocol   Follow Up Recommendations  Inpatient Rehab    Frequency and Duration min 2x/week  2 weeks   Pertinent Vitals/Pain Oxygen saturations lower 90's on 6 L RR   Lower 20's    SLP Swallow Goals     General Date of Onset: 03/07/13 HPI: 58 y/o male with PMH for CAD, CABG, DM, HTN admitted to ED  following witnessed seizure at [employer].  Apneic and  pulseless at scene.  CPR x5 minutes, king airway placed by EMS,  intubated in ED, recurrent V fib aresst in ED required  defibrillation.  CT indicates hydrocephalus with diffuse dilation  to the lateral and third verntricles.  Intubated 2/13 to  2/18.   Postextubation dysphagia suspected following extubation.  BSE  completed on 2/20 with recommendations of NPO.  F/u on 2/21 with  recommendations to continue NPO  with exception of medication  crushed in puree.  Improvement noted in swallow ability on 2/21     Reason for Referral     Oral Phase Oral Preparation/Oral Phase Oral Phase: Impaired Oral - Thin Oral - Thin Teaspoon: Piecemeal swallowing;Holding of  bolus;Reduced posterior propulsion Oral - Thin Cup: Piecemeal swallowing;Holding of bolus;Reduced  posterior propulsion Oral - Thin Straw: Reduced posterior propulsion Oral - Solids Oral - Puree: Piecemeal swallowing;Reduced posterior propulsion Oral - Mechanical Soft: Not tested   Pharyngeal Phase Pharyngeal Phase Pharyngeal Phase: Impaired Pharyngeal - Nectar Pharyngeal - Nectar Teaspoon: Reduced pharyngeal  peristalsis;Reduced anterior laryngeal mobility;Reduced laryngeal  elevation;Reduced airway/laryngeal closure;Pharyngeal residue -  pyriform sinuses Pharyngeal - Thin Pharyngeal - Thin Teaspoon: Reduced pharyngeal  peristalsis;Reduced tongue base retraction;Reduced anterior  laryngeal mobility;Reduced laryngeal elevation;Reduced  airway/laryngeal closure;Pharyngeal residue - pyriform sinuses Pharyngeal - Thin Cup: Reduced pharyngeal peristalsis;Reduced  tongue base retraction;Reduced anterior laryngeal  mobility;Reduced laryngeal elevation;Reduced airway/laryngeal  closure;Pharyngeal residue - pyriform sinuses Pharyngeal - Thin Straw: Reduced pharyngeal peristalsis;Reduced  anterior laryngeal mobility;Reduced laryngeal elevation;Reduced  airway/laryngeal closure;Pharyngeal residue - pyriform sinuses Pharyngeal - Solids Pharyngeal - Puree: Reduced pharyngeal peristalsis;Reduced tongue  base retraction;Reduced anterior laryngeal mobility;Reduced  laryngeal elevation;Reduced airway/laryngeal closure;Pharyngeal  residue - pyriform sinuses;Pharyngeal residue - cp segment  Cervical Esophageal Phase    GO    Cervical Esophageal Phase Cervical Esophageal Phase: Impaired Cervical Esophageal Phase - Thin Thin Straw: Prominent cricopharyngeal segment;Esophageal backflow  into  cervical esophagus Cervical Esophageal Phase - Solids Puree: Prominent cricopharyngeal segment;Esophageal backflow into  cervical esophagus        Sharman Crate MS, CCC-SLP [phone] [facility] 03/16/2013, 8:57 PM     Medications: Scheduled Meds: . amLODipine  10 mg Oral Daily  . antiseptic oral rinse  15 mL Mouth Rinse BID  . aspirin  81 mg Oral Daily  . atorvastatin  40 mg Oral q1800  . heparin subcutaneous  5,000 Units Subcutaneous 3 times per day  . insulin aspart  0-9 Units Subcutaneous TID WC  . insulin aspart  3 Units Subcutaneous TID WC  . isosorbide mononitrate  60 mg Oral Daily  . lisinopril  20 mg Oral BID  . metoprolol tartrate  75 mg Oral TID  . QUEtiapine  12.5 mg Oral QHS  . spironolactone  25 mg Oral Daily      LOS: 16 days   RAI,RIPUDEEP M.D. [facility] 03/23/2013, 10:56 AM Pager: [phone]  If 7PM-7AM, please contact night-coverage [email or web address] Password [email or web address]

## 2013-03-24 ENCOUNTER — Encounter (HOSPITAL_COMMUNITY)
Admission: EM | Disposition: A | Discharge status: Home Health | Payer: Self-pay | Source: Home / Self Care | Attending: Pulmonary Disease

## 2013-03-24 DIAGNOSIS — Z95810 Presence of automatic (implantable) cardiac defibrillator: Secondary | ICD-10-CM | POA: Diagnosis present

## 2013-03-24 DIAGNOSIS — I469 Cardiac arrest, cause unspecified: Secondary | ICD-10-CM

## 2013-03-24 HISTORY — PX: IMPLANTABLE CARDIOVERTER DEFIBRILLATOR IMPLANT: SHX5473

## 2013-03-24 HISTORY — PX: IMPLANTABLE CARDIOVERTER DEFIBRILLATOR IMPLANT: SHX5860

## 2013-03-24 LAB — BASIC METABOLIC PANEL
BUN: 20 mg/dL (ref 6–23)
CALCIUM: 8.8 mg/dL (ref 8.4–10.5)
CO2: 26 mEq/L (ref 19–32)
Chloride: 104 mEq/L (ref 96–112)
Creatinine, Ser: 1.05 mg/dL (ref 0.50–1.35)
GFR calc Af Amer: 88 mL/min — ABNORMAL LOW (ref >90)
GFR calc non Af Amer: 76 mL/min — ABNORMAL LOW (ref >90)
GLUCOSE: 141 mg/dL — AB (ref 70–99)
POTASSIUM: 5.6 meq/L — AB (ref 3.7–5.3)
Sodium: 138 mEq/L (ref 137–147)

## 2013-03-24 LAB — CBC
HCT: 26.3 % — ABNORMAL LOW (ref 39.0–52.0)
HEMOGLOBIN: 9.2 g/dL — AB (ref 13.0–17.0)
MCH: 32.3 pg (ref 26.0–34.0)
MCHC: 35 g/dL (ref 30.0–36.0)
MCV: 92.3 fL (ref 78.0–100.0)
Platelets: 351 10*3/uL (ref 150–400)
RBC: 2.85 MIL/uL — ABNORMAL LOW (ref 4.22–5.81)
RDW: 14.3 % (ref 11.5–15.5)
WBC: 6.9 10*3/uL (ref 4.0–10.5)

## 2013-03-24 LAB — GLUCOSE, CAPILLARY
GLUCOSE-CAPILLARY: 148 mg/dL — AB (ref 70–99)
GLUCOSE-CAPILLARY: 210 mg/dL — AB (ref 70–99)
GLUCOSE-CAPILLARY: 109 mg/dL — AB (ref 70–99)
Glucose-Capillary: 140 mg/dL — ABNORMAL HIGH (ref 70–99)

## 2013-03-24 SURGERY — IMPLANTABLE CARDIOVERTER DEFIBRILLATOR IMPLANT
Anesthesia: LOCAL

## 2013-03-24 MED ORDER — ACETAMINOPHEN 325 MG PO TABS: 325.0000 mg | ORAL_TABLET | ORAL | Status: DC | PRN

## 2013-03-24 MED ORDER — ONDANSETRON HCL 4 MG/2ML IJ SOLN: 4.0000 mg | Freq: Four times a day (QID) | INTRAMUSCULAR | Status: DC | PRN

## 2013-03-24 MED ORDER — CEFAZOLIN SODIUM 1-5 GM-% IV SOLN
1.0000 g | Freq: Four times a day (QID) | INTRAVENOUS | Status: AC
  Administered 2013-03-24 – 2013-03-25 (×3): 1 g via INTRAVENOUS
  Filled 2013-03-24 (×3): qty 50

## 2013-03-24 MED ORDER — LIDOCAINE HCL (PF) 1 % IJ SOLN
INTRAMUSCULAR | Status: AC
  Filled 2013-03-24: qty 60

## 2013-03-24 MED ORDER — CHLORHEXIDINE GLUCONATE 4 % EX LIQD
CUTANEOUS | Status: AC
  Filled 2013-03-24: qty 15

## 2013-03-24 MED ORDER — SODIUM CHLORIDE 0.9 % IV SOLN: INTRAVENOUS | Status: AC

## 2013-03-24 MED ORDER — MIDAZOLAM HCL 5 MG/5ML IJ SOLN
INTRAMUSCULAR | Status: AC
  Filled 2013-03-24: qty 5

## 2013-03-24 MED ORDER — HEPARIN (PORCINE) IN NACL 2-0.9 UNIT/ML-% IJ SOLN
INTRAMUSCULAR | Status: AC
  Filled 2013-03-24: qty 1000

## 2013-03-24 MED ORDER — FENTANYL CITRATE 0.05 MG/ML IJ SOLN
INTRAMUSCULAR | Status: AC
  Filled 2013-03-24: qty 2

## 2013-03-24 NOTE — Progress Notes (Signed)
CSW [employer] forces technical officer) spoke with pt about dc plan. CSW full assessment to follow but at this time pt is wanting to dc home.  [place], [place]

## 2013-03-24 NOTE — Interval H&P Note (Signed)
ICD Criteria  Current LVEF:55% ;Obtained < 1 month ago.  NYHA Functional Classification: Class II  Heart Failure History:  Yes, Duration of heart failure since onset is > 9 months  Non-Ischemic Dilated Cardiomyopathy History:  No.  Atrial Fibrillation/Atrial Flutter:  No.  Ventricular Tachycardia History:  Yes, Hemodynamic instability present, VT Type:  SVT - Polymorphic.  Cardiac Arrest History:  Yes, This was a Ventricular Tachycardia/Ventricular Fibrillation Arrest. This was NOT a bradycardia arrest.  History of Syndromes with Risk of Sudden Death:  No.  Previous ICD:  No.  Electrophysiology Study: No.  Prior MI: Yes, Most recent MI timeframe is > 40 days.  PPM: No.  OSA:  No  Patient Life Expectancy of >=1 year: Yes.  Anticoagulation Therapy:  Patient is NOT on anticoagulation therapy.   Beta Blocker Therapy:  Yes.   Ace Inhibitor/ARB Therapy:  Yes ,   History and Physical Interval Note:  03/24/2013 9:32 AM  Eric [name]  has presented today for surgery, with the diagnosis of cardiac arrest  The various methods of treatment have been discussed with the patient and family. After consideration of risks, benefits and other options for treatment, the patient has consented to  Procedure(s): IMPLANTABLE CARDIOVERTER DEFIBRILLATOR IMPLANT (N/A) as a surgical intervention .  The patient's history has been reviewed, patient examined, no change in status, stable for surgery.  I have reviewed the patient's chart and labs.  Questions were answered to the patient's satisfaction.     Eric [name]

## 2013-03-24 NOTE — CV Procedure (Signed)
Rhonin Lauriano [id number]  [id number]  Preop Dx: aborted cardiac arrest  Postop Dx same/   Procedure: single chamber ICD implantation with out intraoperative defibrillation threshold testing  Following the obtaining of informed consent the patient was brought to the electrophysiology laboratory in place of the fluoroscopic table in the supine position.  After routine prep and drape, lidocaine was infiltrated in the prepectoral subclavicular region and an incision was made and carried down to the layer of the prepectoral fascia using electrocautery and sharp dissection. A pocket was formed similarly.  Thereafter an attention was turned to gain access to the extrathoracic left subclavian vein which was accomplished without  difficulty and without the aspiration of air or puncture of the artery. A 9 French sheath was placed for which was then passed a  Single coil  active fixation defibrillator lead, model [employer] engineer   serial number [id number]. It was  passed under fluoroscopic guidance to the right ventricular apex. In its location the bipolar R wave was 8.3 millivolts, impedance was 517 ohms, the pacing threshold was 0.8 volts at 0.5 msec. Current at threshold was 1.6 mA.  There was no diaphragmatic pacing at 10 V. The current of injury was brisk .  The lead was secured to the prepectoral fascia and then attached to a [place], serial number  [id number].  Through the device, the bipolar R wave was 8.0 millivolts, impedance was 490 ohms, the pacing threshold was 0.5 volts at 0.5 msec. High-voltage impedance of 56 ohms.  The pocket was copiously irrigated with antibiotic containing saline solution. Hemostasis was assured, and the device and the lead was placed in the pocket and secured to the prepectoral fascia.  The wound is closed in 3 layers in normal fashion. The wound is washed dried and a Dermabond dressing was then applied. Needle counts sponge counts and instrument counts were correct at the  end of the procedure according to the staff.    Patient tolerated the procedure without apparent complication      Cx: None      [name], MD 03/24/2013 12:48 PM

## 2013-03-24 NOTE — Progress Notes (Signed)
Orthopedic Tech Progress Note Patient Details:  Eric [name] 06-Apr-1955 [id number]  Ortho Devices Type of Ortho Device: Arm sling Ortho Device/Splint Interventions: Application   Cammer, Theodoro Parma 03/24/2013, 2:39 PM

## 2013-03-24 NOTE — H&P (View-Only) (Signed)
Patient Name: Eric [name]      SUBJECTIVE: s/p cardiac areest in setting of known CAD and prior CABG  Cath severe native disease and patent grafts   Echo EF 55-60& Limited understanding of events but alert oriented and appropriate  Past Medical History  Diagnosis Date  . Hypertension   . Hyperlipemia   . 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 Edema     Scheduled Meds:  Scheduled Meds: . amLODipine  10 mg Oral Daily  . antiseptic oral rinse  15 mL Mouth Rinse BID  . aspirin  81 mg Oral Daily  . atorvastatin  40 mg Oral q1800  . heparin subcutaneous  5,000 Units Subcutaneous 3 times per day  . insulin aspart  0-9 Units Subcutaneous TID WC  . insulin aspart  3 Units Subcutaneous TID WC  . isosorbide mononitrate  60 mg Oral Daily  . lisinopril  20 mg Oral BID  . metoprolol tartrate  75 mg Oral TID  . QUEtiapine  12.5 mg Oral QHS  . spironolactone  25 mg Oral Daily   Continuous Infusions: . sodium chloride 10 mL/hr at 03/17/13 1500    PHYSICAL EXAM Filed Vitals:   03/22/13 2100 03/22/13 2132 03/23/13 0500 03/23/13 1042  BP: 119/64 121/71 121/69 127/68  Pulse: 68 75 71 76  Temp: 98.2 F (36.8 C)  98.3 F (36.8 C)   TempSrc:      Resp: 16  16   Height:      Weight:   159 lb (72.122 kg)   SpO2: 100%  97%     General appearance: alert, cooperative and no distress Neck: no JVD and IJ line in place Lungs: clear to auscultation bilaterally Heart: regular rate and rhythm, S1, S2 normal, 2/6 early murmur, click, rub or gallop Abdomen: soft, non-tender; bowel sounds normal; no masses,  no organomegaly Extremities: extremities normal, atraumatic, no cyanosis or edema Skin: Skin color, texture, turgor normal. No rashes or lesions Neurologic: Alert and oriented X 3, normal strength and tone. Normal symmetric reflexes. Normal coordination and gait  TELEMETRY: Reviewed telemetry pt in NSR:    Intake/Output Summary (Last 24 hours) at  03/23/13 1333 Last data filed at 03/23/13 1145  Gross per 24 hour  Intake    780 ml  Output   1826 ml  Net  -1046 ml    LABS: Basic Metabolic Panel:  Recent Labs Lab 03/17/13 0400 03/17/13 1735 03/18/13 0439 03/19/13 0421 03/20/13 0520 03/21/13 0335  NA 148* 140 140 138 134* 136*  K 3.0* 3.5* 3.4* 3.7 4.4 4.8  CL 109 103 103 102 99 102  CO2 29 27 26 28 27 26   GLUCOSE 104* 133* 115* 164* 164* 149*  BUN 26* 25* 20 22 21 21   CREATININE 1.02 0.95 0.88 0.92 0.87 0.83  CALCIUM 8.4 8.0* 8.1* 8.1* 8.2* 8.7  MG  --  2.1  --   --   --   --    Cardiac Enzymes: No results found for this basename: CKTOTAL, CKMB, CKMBINDEX, TROPONINI,  in the last 72 hours CBC:  Recent Labs Lab 03/17/13 1735 03/18/13 0439 03/19/13 0421  WBC 9.9 7.1 7.2  HGB 8.1* 8.5* 8.1*  HCT 24.0* 24.5* 24.0*  MCV 92.0 91.4 91.6  PLT 212 210 212   PROTIME: No results found for this basename: LABPROT, INR,  in the last 72 hours Liver Function Tests: No results found for this basename:  AST, ALT, ALKPHOS, BILITOT, PROT, ALBUMIN,  in the last 72 hours No results found for this basename: LIPASE, AMYLASE,  in the last 72 hours BNP: BNP (last 3 results)  Recent Labs  03/11/13 0340  PROBNP 370.5*   D-Dimer: No results found for this basename: DDIMER,  in the last 72 hours  Echo normal Electrolytes normal on arrival ECG normal d6  ASSESSMENT AND PLAN:  Active Problems:   Cardiac arrest   Seizure   Acute respiratory failure with hypoxia   Ventricular fibrillation  The patient aborted cardiac arrest in the absence of reversible problems in the context of known coronary artery disease albeit with normal LV function. Is a class I indication to pursue ICD implantation for secondary prevention. We will plan in am  Have reviewed the potential benefits and risks of ICD implantation including but not limited to death, perforation of heart or lung, lead dislodgement, infection,  device malfunction and  inappropriate shocks.  The patient  express understanding  and are willing to proceed.     Signed, [name] MD  03/23/2013

## 2013-03-24 NOTE — Progress Notes (Signed)
Patient ID: Eric [name]  male  [id number]    DOB: [birth year]    DOA: 03/07/2013  PCP: Wendie Simmer, MD  Admit HPI / Brief Narrative:  58 yo with witnessed convulstions at a [employer]. Apneic and pulseless at scene. CPR x 5 minutes , king airway placed by EMS, intubated in ED. Receives his care in [place]. 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    Assessment/Plan:  Acute respiratory failure with Hypoxia due to acute pulmonary edema improving - Weaned off of O2, O2 sats 100% on room air  - Chest x-ray on 2/24 worsening of the bilateral airspace disease due to progressive edema/pneumonia  - finished IV Lasix , continue spironolactone, cardiology following, -14.8 L output  Recurrent VF arrest  - likely the primary event leading to his collapse prior to presentation  - 2-D echo on 2/20  showed EF  55-60% w/ no WMA - amiodarone dc'd  - ICD placement today  VDRF post arrest  -Extubated 2/18 - no evidence of primary pulmonary issues   Sinus bradycardia on amiodarone  -Resolved with discontinuation of amiodarone  Encephalopathy / ? sundowning - significantly improved -secondary to his anoxic event possibly coupled with ICU delirium -  cont scheduled seroquel  - Swallow evaluation done, recommended dysphagia 3 mechanical soft diet with thin liquids  Uncontrolled Hypertension  - Recent issues with increased BP with agitation leading to flash pulm edema - BP currently stable on Norvasc, Lasix, spironolactone, Imdur, lisinopril , beta blocker  Hypernatremia  -resolved   Hyperkalemia - Hold lisinopril and spironolactone today   Anemia of critical illness  -Keep Hgb 8.0 or > - stable at present   Mild thrombocytopenia  -Resolved   Klebsiella in sputum  -Treated with unasyn - afeb - WBC normal - completed 7 days of tx 2/25   DM2  -Reasonably controlled at present, added meal coverage  Hydrocephalus  -Appears congenital on MRI - no further  interventions at this time   Seizures  -Not clear if seizure > arrest vs arrest > convulsive syncope or seizure - significant doubt that this represents seizure as a primary event - not on AED  - EEG w/o epileptiform activity   Nutrition  -Currently tolerating diet w/o difficulty   DVT Prophylaxis: Heparin subcutaneous  Code Status: Full code  Family Communication:  Disposition: Hopefully tomorrow if stable  Consultants: Cardiology EP cardiology Inpatient rehabilitation  Neurology  Antibiotics: Vancomycin 2/17>>>2/19  Zosyn 2/17>>>2/19   Unasyn 2/19>>> 2/25  Subjective: No complaints, no chest pain or shortness of breath, awaiting ICD placement. wants the Foley to be removed   Objective: Weight change: -0.907 kg (-2 lb)  Intake/Output Summary (Last 24 hours) at 03/24/13 1545 Last data filed at 03/23/13 2100  Gross per 24 hour  Intake    480 ml  Output    300 ml  Net    180 ml   Blood pressure 111/66, pulse 67, temperature 97.4 F (36.3 C), temperature source Oral, resp. rate 16, height 6' (1.829 m), weight 71.215 kg (157 lb), SpO2 99.00%.  Physical Exam: General: Alert and awake, oriented, not in any acute distress. CVS: S1-S2 clear, no murmur rubs or gallops Chest: clear to auscultation bilaterally, no wheezing, rales or rhonchi Abdomen: soft nontender, nondistended, normal bowel sounds  Extremities: no cyanosis, clubbing or edema noted bilaterally Neuro: Cranial nerves II-XII intact, no focal neurological deficits  Lab Results: Basic Metabolic Panel:  Recent Labs Lab 03/17/13 1735  03/21/13 0335  03/24/13 1145  NA 140  < > 136* 138  K 3.5*  < > 4.8 5.6*  CL 103  < > 102 104  CO2 27  < > 26 26  GLUCOSE 133*  < > 149* 141*  BUN 25*  < > 21 20  CREATININE 0.95  < > 0.83 1.05  CALCIUM 8.0*  < > 8.7 8.8  MG 2.1  --   --   --   < > = values in this interval not displayed. Liver Function Tests:  Recent Labs Lab 03/17/13 1735  AST 64*  ALT 61*    ALKPHOS 58  BILITOT 0.5  PROT 7.2  ALBUMIN 2.0*   No results found for this basename: LIPASE, AMYLASE,  in the last 168 hours No results found for this basename: AMMONIA,  in the last 168 hours CBC:  Recent Labs Lab 03/19/13 0421 03/24/13 1145  WBC 7.2 6.9  HGB 8.1* 9.2*  HCT 24.0* 26.3*  MCV 91.6 92.3  PLT 212 351   Cardiac Enzymes: No results found for this basename: CKTOTAL, CKMB, CKMBINDEX, TROPONINI,  in the last 168 hours BNP: No components found with this basename: POCBNP,  CBG:  Recent Labs Lab 03/23/13 1123 03/23/13 1717 03/23/13 2144 03/24/13 0825 03/24/13 1311  GLUCAP 219* 124* 160* 148* 140*     Micro Results: No results found for this or any previous visit (from the past 240 hour(s)).  Studies/Results: Ct Head (brain) Wo Contrast  03/07/2013   CLINICAL DATA:  Weakness seizure, became pulseless and apneic, post CPR, history hypertension, diabetes  EXAM: CT HEAD WITHOUT CONTRAST  CT CERVICAL SPINE WITHOUT CONTRAST  TECHNIQUE: Multidetector CT imaging of the head and cervical spine was performed following the standard protocol without intravenous contrast. Multiplanar CT image reconstructions of the cervical spine were also generated.  COMPARISON:  None  FINDINGS: CT HEAD FINDINGS  Motion artifacts, for which repeat imaging was performed.  Diffuse dilatation of the lateral and third ventricles compatible of hydrocephalus.  Fourth rectal decompressed.  Scattered fall seen calcification.  No midline shift or mass effect.  Otherwise normal appearance of brain parenchyma.  No intracranial hemorrhage, mass lesion or evidence acute infarction.  No extra-axial fluid collections.  Beam hardening artifacts of dental origin.  Bones and sinuses unremarkable.  CT CERVICAL SPINE FINDINGS  Visualized skullbase intact.  Osseous mineralization normal.  Endotracheal and nasogastric tubes, with nasogastric tube coiled in pharynx.  Question infiltrate right apex.  Vertebral body and  disc space heights maintained.  Prevertebral soft tissues normal thickness.  No acute fracture, subluxation or bone destruction.  IMPRESSION: Hydrocephalus, with diffuse dilatation of the lateral and third ventricles.  Lack of fourth ventricular dilatation raises question of aquaductal stenosis; consider followup MR imaging of the brain with and without contrast to assess.  No other intracranial abnormalities.  No acute cervical spine abnormalities.  Nasogastric tube coiled in pharynx - this finding was called to [facility] RN 2-Heart at 1418 hr on 03/07/2013.  Question right upper lobe infiltrate.   Electronically Signed   By: Lavonia Dana M.D.   On: 03/07/2013 14:19   Ct Cervical Spine Wo Contrast  03/07/2013   CLINICAL DATA:  Weakness seizure, became pulseless and apneic, post CPR, history hypertension, diabetes  EXAM: CT HEAD WITHOUT CONTRAST  CT CERVICAL SPINE WITHOUT CONTRAST  TECHNIQUE: Multidetector CT imaging of the head and cervical spine was performed following the standard protocol without intravenous contrast. Multiplanar CT image reconstructions of the cervical spine  were also generated.  COMPARISON:  None  FINDINGS: CT HEAD FINDINGS  Motion artifacts, for which repeat imaging was performed.  Diffuse dilatation of the lateral and third ventricles compatible of hydrocephalus.  Fourth rectal decompressed.  Scattered fall seen calcification.  No midline shift or mass effect.  Otherwise normal appearance of brain parenchyma.  No intracranial hemorrhage, mass lesion or evidence acute infarction.  No extra-axial fluid collections.  Beam hardening artifacts of dental origin.  Bones and sinuses unremarkable.  CT CERVICAL SPINE FINDINGS  Visualized skullbase intact.  Osseous mineralization normal.  Endotracheal and nasogastric tubes, with nasogastric tube coiled in pharynx.  Question infiltrate right apex.  Vertebral body and disc space heights maintained.  Prevertebral soft tissues normal thickness.  No acute  fracture, subluxation or bone destruction.  IMPRESSION: Hydrocephalus, with diffuse dilatation of the lateral and third ventricles.  Lack of fourth ventricular dilatation raises question of aquaductal stenosis; consider followup MR imaging of the brain with and without contrast to assess.  No other intracranial abnormalities.  No acute cervical spine abnormalities.  Nasogastric tube coiled in pharynx - this finding was called to [facility] RN 2-Heart at 1418 hr on 03/07/2013.  Question right upper lobe infiltrate.   Electronically Signed   By: Lavonia Dana M.D.   On: 03/07/2013 14:19   Mr Jeri Cos [id number] Contrast  03/13/2013   CLINICAL DATA:  Recent cardiac arrest. Seizure and altered mental status. Ventriculomegaly on CT.  EXAM: MRI HEAD WITHOUT AND WITH CONTRAST  TECHNIQUE: Multiplanar, multiecho pulse sequences of the brain and surrounding structures were obtained without and with intravenous contrast.  CONTRAST:  32mL MULTIHANCE GADOBENATE DIMEGLUMINE 529 MG/ML IV SOLN  COMPARISON:  Head CT 03/07/2013  FINDINGS: Images are moderately degraded by motion artifact. The genu and anterior body of the corpus callosum are present, however the posterior body and splenium are not clearly identified. There is dilatation of the lateral and third ventricles, with marked dilatation of the atria of the lateral ventricles. The fourth ventricle is nondilated. The cerebral aqueduct appears grossly patent without tectal region mass seen. Periventricular T2 hyperintensities suggestive of transependymal CSF flow is identified.  There is no evidence of acute infarct. There is no evidence of mass, midline shift, or extra-axial fluid collection. Small foci of T2 hyperintensity within the cerebral white matter are nonspecific but compatible with mild chronic small vessel ischemic disease. Small foci of susceptibility artifact are seen in the anterior right frontal lobe and at the margin of the right frontal horn, compatible with remote  hemorrhage. There is also evidence of a small amount of remote hemorrhage in the region of the right basal ganglia. No gross abnormal enhancement is identified.  Major intracranial vascular flow voids are grossly preserved. Orbits are unremarkable. Paranasal sinuses and mastoid air cells are clear.  IMPRESSION: 1. Moderate motion artifact. No evidence of acute intracranial abnormality. 2. Ventricular dilatation, which is felt to be congenital/developmental rather than representing acute hydrocephalus, with corpus callosum dysgenesis. These results were called by telephone at the time of interpretation on 03/13/2013 at 7:20 PM to Dr. Leonel Ramsay, who verbally acknowledged these results.   Electronically Signed   By: Logan Bores   On: 03/13/2013 19:46   Dg Chest Port 1 View  03/18/2013   CLINICAL DATA:  Seizure. Encephalopathy and seizure. Uncontrolled hypertension. Hypoxia.  EXAM: PORTABLE CHEST - 1 VIEW  COMPARISON:  Single view of the chest 03/17/2013 and 03/15/2013.  FINDINGS: Left IJ catheter remains in place. Bilateral airspace seen  on yesterday's examination has markedly worsened. There are likely small bilateral pleural effusions. Cardiomegaly is noted.  IMPRESSION: Marked worsening bilateral airspace disease could be due to progressive edema or pneumonia.   Electronically Signed   By: Inge Rise M.D.   On: 03/18/2013 07:44   Dg Chest Port 1 View  03/17/2013   CLINICAL DATA:  58 year old male with acute respiratory failure. Cardiac arrest. Initial encounter.  EXAM: PORTABLE CHEST - 1 VIEW  COMPARISON:  03/15/2013 and earlier.  FINDINGS: Portable AP upright view at 0736 hr. Stable left IJ central line. Stable cardiac size and mediastinal contours. No pneumothorax. No pleural effusion identified. Decreased widespread indistinct pulmonary opacity, residual now mostly in both upper lobes. No areas of worsening ventilation.  IMPRESSION: Regressed bilateral indistinct pulmonary opacity, favor resolving  pulmonary edema. Residual in both upper lungs.   Electronically Signed   By: Lars Pinks M.D.   On: 03/17/2013 08:09   Dg Chest Port 1 View  03/15/2013   CLINICAL DATA:  Hypoxia.  Follow-up pulmonary edema.  EXAM: PORTABLE CHEST - 1 VIEW  COMPARISON:  DG CHEST 1V PORT dated 03/14/2013; DG CHEST 1V PORT dated 03/13/2013; DG CHEST 1V PORT dated 03/13/2013; DG CHEST 1V PORT dated 03/12/2013  FINDINGS: Prior sternotomy for CABG. Cardiac silhouette moderately enlarged but stable. Interval worsening of interstitial and airspace opacities throughout both lungs. Small bilateral pleural effusions suspected. Left jugular central venous catheter tip projects over the lower SVC, unchanged.  IMPRESSION: Interval worsening of now severe interstitial and airspace pulmonary edema throughout both lungs.   Electronically Signed   By: Evangeline Dakin M.D.   On: 03/15/2013 19:33   Dg Chest Port 1 View  03/14/2013   CLINICAL DATA:  Central line placement.  EXAM: PORTABLE CHEST - 1 VIEW  COMPARISON:  03/13/2013  FINDINGS: Prior CABG. Left central line is in place with the tip at the cavoatrial junction. No pneumothorax.  Cardiomegaly with vascular congestion and bilateral airspace opacities, likely edema. No real change.  IMPRESSION: Left central line tip at the cavoatrial junction.  No pneumothorax.  Bilateral airspace disease, likely edema, unchanged.   Electronically Signed   By: Rolm Baptise M.D.   On: 03/14/2013 05:09   Dg Chest Port 1 View  03/13/2013   CLINICAL DATA:  Status post nasogastric tube placement  EXAM: PORTABLE CHEST - 1 VIEW  COMPARISON:  DG CHEST 1V PORT dated 03/13/2013  FINDINGS: The nasogastric tube tip and proximal port lie well below the GE junction. The tube is coiled upon itself within the gastric fundus. The lungs are borderline hypoinflated. The interstitial markings remain increased bilaterally. The cardiac silhouette remains enlarged and the pulmonary vascularity remains engorged. The left internal  jugular venous catheter tip lies in the region of the proximal to mid SVC.  IMPRESSION: Interval placement of a nasogastric tube reveals reasonable positioning below the level of the GE junction.   Electronically Signed   By: David  Martinique   On: 03/13/2013 15:33   Dg Chest Port 1 View  03/13/2013   CLINICAL DATA:  Endotracheal tube position, shortness of breath.  EXAM: PORTABLE CHEST - 1 VIEW  COMPARISON:  DG CHEST 1V PORT dated 03/12/2013  FINDINGS: Interval extubation. Interval removal of nasogastric tube. Left internal jugular central venous catheter with distal tip projecting in mid superior vena cava remains.  Stable mild moderate cardiomegaly, status post median sternotomy. Central pulmonary vasculature congestion interstitial prominence, slightly interstitial prominence. No pleural effusions. No pneumothorax.  Multiple EKG  lines overlie the patient and may obscure subtle underlying pathology. Soft tissue planes and included osseous structures are unremarkable.  IMPRESSION: Interval extubation removal of nasogastric tube. No apparent change in position of left IJ line.  Stable cardiomegaly and worsening interstitial edema.   Electronically Signed   By: Elon Alas   On: 03/13/2013 05:58   Dg Chest Port 1 View  03/12/2013   CLINICAL DATA:  Assess endotracheal tube  EXAM: PORTABLE CHEST - 1 VIEW  COMPARISON:  DG CHEST 1V PORT dated 03/11/2013  FINDINGS: Endotracheal tube tip projects 4.3 cm above the carina. Left internal jugular central venous catheter with distal tip projecting in proximal superior vena cava. Nasogastric tube past the gastroesophageal junction, distal tip projecting pack at gastric fundus. Multiple EKG lines overlie the patient and may obscure subtle underlying pathology.  Cardiac silhouette appears mildly enlarged, status post median sternotomy. Similar interstitial prominence in central pulmonary vasculature engorgement. Mild patchy perihilar airspace opacities without pleural  effusions. No pneumothorax.  Soft tissue planes and included osseous structures are unremarkable.  IMPRESSION: No apparent change in position of life support lines.  Mild cardiomegaly and interstitial prominence with perihilar airspace opacities concerning for pulmonary edema.   Electronically Signed   By: Elon Alas   On: 03/12/2013 04:57   Dg Chest Port 1 View  03/11/2013   CLINICAL DATA:  Check ETT position.  EXAM: PORTABLE CHEST - 1 VIEW  COMPARISON:  03/10/2013  FINDINGS: Prior CABG. Support devices are stable. Slight increasing perihilar opacities and interstitial prominence, possibly interstitial edema. Heart is borderline in size. No effusions. No acute bony abnormality.  IMPRESSION: Increasing interstitial prominence and perihilar opacities concerning for mild edema.   Electronically Signed   By: Rolm Baptise M.D.   On: 03/11/2013 06:46   Dg Chest Port 1 View  03/10/2013   CLINICAL DATA:  Respiratory failure  EXAM: PORTABLE CHEST - 1 VIEW  COMPARISON:  Prior radiograph from 03/09/2013  FINDINGS: Endotracheal tube terminates well above the carina. Enteric tube courses into the abdomen with tip located in the gastric fundus. Left IJ central venous catheter tip overlies the distal SVC. Cardiac and mediastinal silhouettes unchanged. Median sternotomy wires remain intact.  The lungs are normally inflated. There has been interval improvement in previously seen confluent opacity within the right perihilar region. No overt pulmonary edema. No new focal infiltrate or pneumothorax. No pleural effusion.  IMPRESSION: 1. Lines and tubes in satisfactory position. 2. Interval improvement in the hazy opacity overlying the right mid lung. No new focal infiltrate or overt pulmonary edema identified.   Electronically Signed   By: Jeannine Boga M.D.   On: 03/10/2013 06:09   Dg Chest Port 1 View  03/09/2013   CLINICAL DATA:  Respiratory failure.  EXAM: PORTABLE CHEST - 1 VIEW  COMPARISON:  03/07/2013   FINDINGS: Endotracheal tube has tip approximately 3.5 cm above the carina. Enteric tube courses into the region of the stomach and off the inferior portion of the film. Left IJ central venous catheter is unchanged with tip over the SVC. Lungs are adequately inflated with slight worsening hazy opacification over the central right lung. No evidence of effusion or pneumothorax. Cardiomediastinal silhouette and remainder the exam is unchanged.  IMPRESSION: Slight worsening hazy opacification over the central right lung which may be due to asymmetric edema or infection.  Tubes and lines as described.   Electronically Signed   By: Marin Olp M.D.   On: 03/09/2013 08:46   Dg  Chest Port 1 View  03/07/2013   CLINICAL DATA:  Cardiac arrest.  Central line placement.  EXAM: PORTABLE CHEST - 1 VIEW  COMPARISON:  03/07/2013  FINDINGS: Endotracheal tube and NG tube are in good position. Left jugular vein catheter is been inserted the tip is at the cavoatrial junction in good position. No pneumothorax. Left lung remains clear. There is slight haziness in the right perihilar region.  IMPRESSION: 1. Central venous catheter in good position. 2. New slight haziness in the right perihilar region. The possibility of aspiration pneumonitis should be considered.   Electronically Signed   By: Rozetta Nunnery M.D.   On: 03/07/2013 13:12   Dg Chest Portable 1 View  03/07/2013   CLINICAL DATA:  Intubation.  EXAM: PORTABLE CHEST - 1 VIEW  COMPARISON:  None.  FINDINGS: Endotracheal tube tip in satisfactory position projecting approximately 4 cm above the carina. Nasogastric tube tip in the distal esophagus just above the EG junction. Prior sternotomy for CABG. Cardiac silhouette upper normal in size to slightly enlarged for AP portable technique. Linear atelectasis or scar in the lingula. Lungs otherwise clear. No localized airspace consolidation. No pleural effusions. No pneumothorax. Normal pulmonary vascularity.  IMPRESSION: 1.  Endotracheal tube tip in satisfactory position projecting approximately 4 cm above the carina. 2. Nasogastric tube tip in the distal esophagus just above the EG junction. This should be advanced several cm. 3. Borderline heart size. Linear atelectasis or scar in the lingula. No acute cardiopulmonary disease otherwise.   Electronically Signed   By: Evangeline Dakin M.D.   On: 03/07/2013 11:59   Dg Abd Portable 1v  03/07/2013   CLINICAL DATA:  Nasogastric tube placement  EXAM: PORTABLE ABDOMEN - 1 VIEW  COMPARISON:  Chest x-ray 03/07/2013  FINDINGS: A nasogastric tube is present. The tip and proximal side hole are noted within the gastric fundus. No evidence of bowel obstruction. Contrast material is noted in the right renal collecting system. Mild cardiomegaly and prior median sternotomy are noted. No free air. A right femoral approach central venous catheter is present. The catheter tip projects over the L5 vertebral body likely within the common iliac vein.  IMPRESSION: The nasogastric tube is well positioned in the gastric fundus.  The tip of a right femoral approach central venous catheter projects over the L5 pedicle likely in the common iliac vein.   Electronically Signed   By: Jacqulynn Cadet M.D.   On: 03/07/2013 18:44   Dg Swallowing Func-speech Pathology  03/16/2013   Marcille Buffy, [place]     03/16/2013  9:23 PM Objective Swallowing Evaluation:    Patient Details  Name: Eric [name] MRN: [id number] Date of Birth: [birth year]  Today's Date: 03/16/2013 Time: 1630-1700 SLP Time Calculation (min): 30 min  Past Medical History:  Past Medical History  Diagnosis Date  . Hypertension   . Hyperlipemia   . 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 Edema    Past Surgical History:  Past Surgical History  Procedure Laterality Date  . Coronary artery bypass graft     HPI:  58 y/o male with PMH for CAD, CABG, DM, HTN admitted to ED  following witnessed seizure at [employer].  Apneic and   pulseless at scene.  CPR x5 minutes, king airway placed by EMS,  intubated in ED, recurrent V fib arrest in ED required  defibrillation.  CT indicates hydrocephalus with diffuse dilation  to the lateral and third ventricles.  Intubated 2/13  to 2/18.   Postextubation dysphagia suspected following extubation.  BSE  completed on 2/219 with recommendations of NPO.  F/u on 2/20with  recommendations to continue NPO with exception of medication  crushed in puree.  F/u on 2/21 indicates improvement in overall  swallow ability to but continued with reduced ability to protect  airway fully.  Early am on 2/22 desat to low 80's with increased  confusion requiring Bi-Pap.   Per nursing patient maintaining  oxygen saturations with nasal cannula with improved LOA.  MBS  indicated to objectively assess for aspiration and determine  safest, PO diet as patient continues with NPO status.    Assessment / Plan / Recommendation Clinical Impression  Dysphagia Diagnosis: Mild oral phase dysphagia;Mild pharyngeal  phase dysphagia;Mild cervical esophageal phase dysphagia Mild to moderate pharyngeal dysphagia marked mainly by what  appears to laryngeal edema affecting bolus transit.  All swallows  triggered at valleculae space.  Bolus contained at level of C6  prior to completed transit.  Appeared to be artifact at C-6  radiologist present unable to confirm.  Moderate amount of  residuals at level of pyriforms requiring multiple swallows with  use of chin tuck to clear.  Patient did not sense containment.     Due to narrowing of pharynx from what appeared to be edema trials  of soft and regular solids and whole barium tablet deferred.   Brief esophageal screen indicates slow bolus transit with  backflow noted to cervical esophagus with puree and thin liquid  trials.  No penetration or aspiration events occurred.  Recommend  to initiate dysphagia 1 (puree)  diet consistency and thin liquid  with full supervision with all PO's due to noted  cognitive  deficits.  Recommend strict aspiration precautions and use of  swallow strategies as aspiration risk remains high s/p swallow.   Diagnostic treatment completed following study focusing on diet  recommendations and necessary swallow strategies to nursing.  ST  to follow closely in acute care  for diet tolerance.  Recommend  repeat MBS with clinical improvement prior to diet upgrade.      Treatment Recommendation       Diet Recommendation Dysphagia 1 (Puree);No mixed  consistencies;Thin liquid   Liquid Administration via: Cup;Straw Medication Administration: Crushed with puree Supervision: Full supervision/cueing for compensatory strategies Compensations: Multiple dry swallows after each  bite/sip;Effortful swallow Postural Changes and/or Swallow Maneuvers: Seated upright 90  degrees;Upright 30-60 min after meal;Chin tuck    Other  Recommendations Oral Care Recommendations: Oral care Q4  per protocol   Follow Up Recommendations  Inpatient Rehab    Frequency and Duration min 2x/week  2 weeks   Pertinent Vitals/Pain Oxygen saturations lower 90's on 6 L RR   Lower 20's    SLP Swallow Goals     General Date of Onset: 03/07/13 HPI: 58 y/o male with PMH for CAD, CABG, DM, HTN admitted to ED  following witnessed seizure at [employer].  Apneic and  pulseless at scene.  CPR x5 minutes, king airway placed by EMS,  intubated in ED, recurrent V fib aresst in ED required  defibrillation.  CT indicates hydrocephalus with diffuse dilation  to the lateral and third verntricles.  Intubated 2/13 to 2/18.   Postextubation dysphagia suspected following extubation.  BSE  completed on 2/20 with recommendations of NPO.  F/u on 2/21 with  recommendations to continue NPO with exception of medication  crushed in puree.  Improvement noted in swallow ability on 2/21  Reason for Referral     Oral Phase Oral Preparation/Oral Phase Oral Phase: Impaired Oral - Thin Oral - Thin Teaspoon: Piecemeal swallowing;Holding of  bolus;Reduced  posterior propulsion Oral - Thin Cup: Piecemeal swallowing;Holding of bolus;Reduced  posterior propulsion Oral - Thin Straw: Reduced posterior propulsion Oral - Solids Oral - Puree: Piecemeal swallowing;Reduced posterior propulsion Oral - Mechanical Soft: Not tested   Pharyngeal Phase Pharyngeal Phase Pharyngeal Phase: Impaired Pharyngeal - Nectar Pharyngeal - Nectar Teaspoon: Reduced pharyngeal  peristalsis;Reduced anterior laryngeal mobility;Reduced laryngeal  elevation;Reduced airway/laryngeal closure;Pharyngeal residue -  pyriform sinuses Pharyngeal - Thin Pharyngeal - Thin Teaspoon: Reduced pharyngeal  peristalsis;Reduced tongue base retraction;Reduced anterior  laryngeal mobility;Reduced laryngeal elevation;Reduced  airway/laryngeal closure;Pharyngeal residue - pyriform sinuses Pharyngeal - Thin Cup: Reduced pharyngeal peristalsis;Reduced  tongue base retraction;Reduced anterior laryngeal  mobility;Reduced laryngeal elevation;Reduced airway/laryngeal  closure;Pharyngeal residue - pyriform sinuses Pharyngeal - Thin Straw: Reduced pharyngeal peristalsis;Reduced  anterior laryngeal mobility;Reduced laryngeal elevation;Reduced  airway/laryngeal closure;Pharyngeal residue - pyriform sinuses Pharyngeal - Solids Pharyngeal - Puree: Reduced pharyngeal peristalsis;Reduced tongue  base retraction;Reduced anterior laryngeal mobility;Reduced  laryngeal elevation;Reduced airway/laryngeal closure;Pharyngeal  residue - pyriform sinuses;Pharyngeal residue - cp segment  Cervical Esophageal Phase    GO    Cervical Esophageal Phase Cervical Esophageal Phase: Impaired Cervical Esophageal Phase - Thin Thin Straw: Prominent cricopharyngeal segment;Esophageal backflow  into cervical esophagus Cervical Esophageal Phase - Solids Puree: Prominent cricopharyngeal segment;Esophageal backflow into  cervical esophagus        Sharman Crate MS, CCC-SLP [phone] [facility] 03/16/2013, 8:57 PM     Medications: Scheduled Meds: . amLODipine   10 mg Oral Daily  . antiseptic oral rinse  15 mL Mouth Rinse BID  . aspirin  81 mg Oral Daily  . atorvastatin  40 mg Oral q1800  .  ceFAZolin (ANCEF) IV  1 g Intravenous Q6H  . heparin subcutaneous  5,000 Units Subcutaneous 3 times per day  . insulin aspart  0-9 Units Subcutaneous TID WC  . insulin aspart  3 Units Subcutaneous TID WC  . isosorbide mononitrate  60 mg Oral Daily  . lisinopril  20 mg Oral BID  . metoprolol tartrate  75 mg Oral TID  . QUEtiapine  12.5 mg Oral QHS  . spironolactone  25 mg Oral Daily      LOS: 30 days   Markes Shatswell M.D. [facility] 03/24/2013, 3:45 PM Pager: [phone]  If 7PM-7AM, please contact night-coverage [email or web address] Password [email or web address]

## 2013-03-24 NOTE — Progress Notes (Signed)
Pt only has 1 IV site d/t pt being a difficult stick. IV team was called & it took 3 different RNs to get 1 IV site. [name] Central line has been removed per order. Hoover Brunette, RN

## 2013-03-24 NOTE — Progress Notes (Signed)
PT Cancellation Note  Patient Details Name: Gregeory Diangelo MRN: [id number] DOB: 1955/09/28   Cancelled Treatment:    Reason Eval/Treat Not Completed: Other (comment) (orders for bedrest until AM (03/25/13) s/p ICD insertion.  PT to check back tomorrow.     Barbarann Ehlers [place], [place], DPT [phone]   03/24/2013, 3:40 PM

## 2013-03-24 NOTE — Progress Notes (Signed)
Pt shaved, consent done, & given hibiclens bath this am. Pt has been npo since midnight. Hoover Brunette, RN

## 2013-03-25 ENCOUNTER — Inpatient Hospital Stay (HOSPITAL_COMMUNITY): Payer: Medicaid Other

## 2013-03-25 LAB — GLUCOSE, CAPILLARY
GLUCOSE-CAPILLARY: 145 mg/dL — AB (ref 70–99)
Glucose-Capillary: 178 mg/dL — ABNORMAL HIGH (ref 70–99)

## 2013-03-25 MED ORDER — METOPROLOL TARTRATE 25 MG PO TABS: 75.0000 mg | ORAL_TABLET | Freq: Two times a day (BID) | ORAL | Status: DC

## 2013-03-25 MED ORDER — LISINOPRIL 5 MG PO TABS: 5.0000 mg | ORAL_TABLET | Freq: Every day | ORAL | Status: DC

## 2013-03-25 MED ORDER — ISOSORBIDE MONONITRATE ER 60 MG PO TB24: 60.0000 mg | ORAL_TABLET | Freq: Every day | ORAL | Status: DC

## 2013-03-25 MED ORDER — AMLODIPINE BESYLATE 10 MG PO TABS: 10.0000 mg | ORAL_TABLET | Freq: Every day | ORAL | Status: DC

## 2013-03-25 NOTE — Progress Notes (Signed)
   ELECTROPHYSIOLOGY ROUNDING NOTE    Patient Name: Eric [name] Date of Encounter: 03/25/2013    SUBJECTIVE:Patient feels well today.  No chest pain or shortness of breath.  Moderate incisional soreness.  S/p ICD implant yesterday.   TELEMETRY: Reviewed telemetry pt in sinus rhythm, occasional PVC's Filed Vitals:   03/24/13 1630 03/24/13 1729 03/24/13 2046 03/25/13 0512  BP: 103/62 115/71 115/65 120/70  Pulse: 62 66 72 72  Temp:   98.4 F (36.9 C) 97.6 F (36.4 C)  TempSrc:   Oral Oral  Resp:   18 18  Height:      Weight:    156 lb 8.4 oz (71 kg)  SpO2: 96% 97% 100% 98%    CURRENT MEDICATIONS: . amLODipine  10 mg Oral Daily  . antiseptic oral rinse  15 mL Mouth Rinse BID  . aspirin  81 mg Oral Daily  . atorvastatin  40 mg Oral q1800  . heparin subcutaneous  5,000 Units Subcutaneous 3 times per day  . insulin aspart  0-9 Units Subcutaneous TID WC  . insulin aspart  3 Units Subcutaneous TID WC  . isosorbide mononitrate  60 mg Oral Daily  . metoprolol tartrate  75 mg Oral TID  . QUEtiapine  12.5 mg Oral QHS    LABS: Basic Metabolic Panel:  Recent Labs  03/24/13 1145  NA 138  K 5.6*  CL 104  CO2 26  GLUCOSE 141*  BUN 20  CREATININE 1.05  CALCIUM 8.8   CBC:  Recent Labs  03/24/13 1145  WBC 6.9  HGB 9.2*  HCT 26.3*  MCV 92.3  PLT 351    Radiology/Studies:  Final result pending, leads in stable position.  PHYSICAL EXAM Wound - no hematoma, minimal ecchymosis  DEVICE INTERROGATION: Device interrogation pending  Active Problems:   Cardiac arrest   Seizure   Acute respiratory failure with hypoxia   Ventricular fibrillation   Wound care, instructions, shock plan reviewed with patient. Routine follow up scheduled.  Patient aware no driving for 6 months.   Mikle Bosworth.D.

## 2013-03-25 NOTE — Discharge Summary (Signed)
Physician Discharge Summary  Patient ID: Eric [name] MRN: [id number] DOB/AGE: [birth year] 60 y.o.  Admit date: 03/07/2013 Discharge date: 03/25/2013  Primary Care Physician:  Wendie Simmer, MD  Discharge Diagnoses:    . recurrent V. fib Cardiac arrest Acute respiratory failure with hypoxia due to acute pulmonary edema Sinus bradycardia Acute encephalopathy Uncontrolled hypertension Hypernatremia Hyperkalemia Anemia critical illness Mild thrombocytopenia Klebsiella pneumonia Diabetes type 2 Seizures  Consults: Cardiology, Dr.Harwani                     Electrophysiology, Dr. Crissie Sickles, Dr. Caryl Comes  Recommendations for Outpatient Follow-up:  Please obtain BMET at the time of the followup appointment for renal function and potassium level  Please note spironolactone was placed on hold for hyperkalemia. Please restart spironolactone if needed.  Allergies:  No Known Allergies   Discharge Medications:   Medication List    STOP taking these medications       lisinopril-hydrochlorothiazide 20-12.5 MG per tablet  Commonly known as:  PRINZIDE,ZESTORETIC      TAKE these medications       amLODipine 10 MG tablet  Commonly known as:  NORVASC  Take 1 tablet (10 mg total) by mouth daily.     aspirin EC 81 MG tablet  Take 81 mg by mouth daily.     gabapentin 300 MG capsule  Commonly known as:  NEURONTIN  Take 300 mg by mouth 2 (two) times daily.     glipiZIDE 5 MG tablet  Commonly known as:  GLUCOTROL  Take 5 mg by mouth daily before breakfast.     isosorbide mononitrate 60 MG 24 hr tablet  Commonly known as:  IMDUR  Take 1 tablet (60 mg total) by mouth daily.     lisinopril 5 MG tablet  Commonly known as:  PRINIVIL  Take 1 tablet (5 mg total) by mouth daily.     metFORMIN 500 MG (MOD) 24 hr tablet  Commonly known as:  GLUMETZA  Take 500 mg by mouth daily with breakfast.     metoprolol tartrate 25 MG tablet  Commonly known as:  LOPRESSOR  Take 3  tablets (75 mg total) by mouth 2 (two) times daily.     simvastatin 20 MG tablet  Commonly known as:  ZOCOR  Take 20 mg by mouth daily.         Brief H and P: For complete details please refer to admission H and P, but in brief patient is a 58 year old male with witnessed a seizure at a [employer]. Patient was apneic and pulseless at the scene. Patient received CPR for 5 minutes with tingling replaced by EMS and intubated in the ER. Patient had recurrent V. fib arrest in the ED requiring defibrillation for 5 minutes.   Hospital Course:   Patient is a 58 year old male with past medical history of hypertension, diabetes mellitus, hyperlipidemia, CAD s/p CABG Was brought to the ER after witnessed a seizure at a [employer] followed by a cardiac arrest requiring CPR and intubation. In the ER he had recurrent episodes of V. fib arrest necessitating defibrillation. He underwent emergent cardiac cath which showed severe native vessel coronary artery disease but patent SVG and LIMA EEG showed slow background but no epileptiform activities. He was extubated on 03/12/13   Acute respiratory failure with Hypoxia due to acute pulmonary edema/ventilator dependent respiratory failure post arrest improved, patient was extubated on 03/12/2013. He is currently weaned off of O2, O2 sats 100% on room air.  Chest x-ray on 2/24 worsening of the bilateral airspace disease due to progressive edema/pneumonia. He has completed IV Lasix, output -15 L since admission. Spironolactone is currently on hold secondary to hyperkalemia. Patient is recommended to follow up with Dr. Terrence Dupont.   Recurrent VF arrest - likely the primary event leading to his collapse prior to presentation. 2-D echo on 2/20 showed EF 55-60% with no wall motion abnormalities. Amiodarone was discontinued due to sinus bradycardia which was resolved. EP service was consulted and patient underwent ICD placement on 03/24/2013. Follow up appointment with  Dr. Caryl Comes on 04/03/2013 has been scheduled.   Sinus bradycardia on amiodarone  -Resolved with discontinuation of amiodarone   Encephalopathy / ? sundowning - significantly improved, likely secondary to his anoxic event possibly coupled with ICU delirium.  Swallow evaluation done, recommended dysphagia 3 mechanical soft diet with thin liquids.   Uncontrolled Hypertension  Recent issues with increased BP with agitation likely leading to flash pulm edema  - BP currently stable on Norvasc, imdur, lisinopril , beta blocker . Patient has completed Lasix. Spironolactone currently on hold.  Hypernatremia -resolved   Hyperkalemia - Hold lisinopril and spironolactone today   Anemia of critical illness -Keep Hgb 8.0 or > - stable at present   Mild thrombocytopenia -Resolved   Klebsiella in sputum  -Treated with unasyn - afeb - WBC normal - completed 7 days of tx 2/25   DM2  -Reasonably controlled at present, added meal coverage   Hydrocephalus  -Appears congenital on MRI - no further interventions at this time   Seizures Not clear if seizure led to convulsive syncope or seizure or arrest - significant doubt that this represents seizure as a primary event - not on AED. EEG w/o epileptiform activity   Nutrition  -Currently tolerating diet w/o difficulty      Day of Discharge BP 134/71  Pulse 81  Temp(Src) 97.6 F (36.4 C) (Oral)  Resp 18  Ht 6' (1.829 m)  Wt 71 kg (156 lb 8.4 oz)  BMI 21.22 kg/m2  SpO2 98%  Physical Exam: General: Alert and awake oriented x3 not in any acute distress. CVS: S1-S2 clear no murmur rubs or gallops Chest: clear to auscultation bilaterally, no wheezing rales or rhonchi Abdomen: soft nontender, nondistended, normal bowel sounds Extremities: no cyanosis, clubbing or edema noted bilaterally Neuro: Cranial nerves II-XII intact, no focal neurological deficits   The results of significant diagnostics from this hospitalization (including imaging,  microbiology, ancillary and laboratory) are listed below for reference.    LAB RESULTS: Basic Metabolic Panel:  Recent Labs Lab 03/21/13 0335 03/24/13 1145  NA 136* 138  K 4.8 5.6*  CL 102 104  CO2 26 26  GLUCOSE 149* 141*  BUN 21 20  CREATININE 0.83 1.05  CALCIUM 8.7 8.8   Liver Function Tests: No results found for this basename: AST, ALT, ALKPHOS, BILITOT, PROT, ALBUMIN,  in the last 168 hours No results found for this basename: LIPASE, AMYLASE,  in the last 168 hours No results found for this basename: AMMONIA,  in the last 168 hours CBC:  Recent Labs Lab 03/19/13 0421 03/24/13 1145  WBC 7.2 6.9  HGB 8.1* 9.2*  HCT 24.0* 26.3*  MCV 91.6 92.3  PLT 212 351   Cardiac Enzymes: No results found for this basename: CKTOTAL, CKMB, CKMBINDEX, TROPONINI,  in the last 168 hours BNP: No components found with this basename: POCBNP,  CBG:  Recent Labs Lab 03/25/13 0732 03/25/13 1139  GLUCAP 145* 178*  Significant Diagnostic Studies:  Ct Head (brain) Wo Contrast  03/07/2013   CLINICAL DATA:  Weakness seizure, became pulseless and apneic, post CPR, history hypertension, diabetes  EXAM: CT HEAD WITHOUT CONTRAST  CT CERVICAL SPINE WITHOUT CONTRAST  TECHNIQUE: Multidetector CT imaging of the head and cervical spine was performed following the standard protocol without intravenous contrast. Multiplanar CT image reconstructions of the cervical spine were also generated.  COMPARISON:  None  FINDINGS: CT HEAD FINDINGS  Motion artifacts, for which repeat imaging was performed.  Diffuse dilatation of the lateral and third ventricles compatible of hydrocephalus.  Fourth rectal decompressed.  Scattered fall seen calcification.  No midline shift or mass effect.  Otherwise normal appearance of brain parenchyma.  No intracranial hemorrhage, mass lesion or evidence acute infarction.  No extra-axial fluid collections.  Beam hardening artifacts of dental origin.  Bones and sinuses unremarkable.   CT CERVICAL SPINE FINDINGS  Visualized skullbase intact.  Osseous mineralization normal.  Endotracheal and nasogastric tubes, with nasogastric tube coiled in pharynx.  Question infiltrate right apex.  Vertebral body and disc space heights maintained.  Prevertebral soft tissues normal thickness.  No acute fracture, subluxation or bone destruction.  IMPRESSION: Hydrocephalus, with diffuse dilatation of the lateral and third ventricles.  Lack of fourth ventricular dilatation raises question of aquaductal stenosis; consider followup MR imaging of the brain with and without contrast to assess.  No other intracranial abnormalities.  No acute cervical spine abnormalities.  Nasogastric tube coiled in pharynx - this finding was called to [facility] RN 2-Heart at 1418 hr on 03/07/2013.  Question right upper lobe infiltrate.   Electronically Signed   By: Lavonia Dana M.D.   On: 03/07/2013 14:19   Ct Cervical Spine Wo Contrast  03/07/2013   CLINICAL DATA:  Weakness seizure, became pulseless and apneic, post CPR, history hypertension, diabetes  EXAM: CT HEAD WITHOUT CONTRAST  CT CERVICAL SPINE WITHOUT CONTRAST  TECHNIQUE: Multidetector CT imaging of the head and cervical spine was performed following the standard protocol without intravenous contrast. Multiplanar CT image reconstructions of the cervical spine were also generated.  COMPARISON:  None  FINDINGS: CT HEAD FINDINGS  Motion artifacts, for which repeat imaging was performed.  Diffuse dilatation of the lateral and third ventricles compatible of hydrocephalus.  Fourth rectal decompressed.  Scattered fall seen calcification.  No midline shift or mass effect.  Otherwise normal appearance of brain parenchyma.  No intracranial hemorrhage, mass lesion or evidence acute infarction.  No extra-axial fluid collections.  Beam hardening artifacts of dental origin.  Bones and sinuses unremarkable.  CT CERVICAL SPINE FINDINGS  Visualized skullbase intact.  Osseous mineralization normal.   Endotracheal and nasogastric tubes, with nasogastric tube coiled in pharynx.  Question infiltrate right apex.  Vertebral body and disc space heights maintained.  Prevertebral soft tissues normal thickness.  No acute fracture, subluxation or bone destruction.  IMPRESSION: Hydrocephalus, with diffuse dilatation of the lateral and third ventricles.  Lack of fourth ventricular dilatation raises question of aquaductal stenosis; consider followup MR imaging of the brain with and without contrast to assess.  No other intracranial abnormalities.  No acute cervical spine abnormalities.  Nasogastric tube coiled in pharynx - this finding was called to [facility] RN 2-Heart at 1418 hr on 03/07/2013.  Question right upper lobe infiltrate.   Electronically Signed   By: Lavonia Dana M.D.   On: 03/07/2013 14:19   Dg Chest Port 1 View  03/07/2013   CLINICAL DATA:  Cardiac arrest.  Central line placement.  EXAM: PORTABLE CHEST - 1 VIEW  COMPARISON:  03/07/2013  FINDINGS: Endotracheal tube and NG tube are in good position. Left jugular vein catheter is been inserted the tip is at the cavoatrial junction in good position. No pneumothorax. Left lung remains clear. There is slight haziness in the right perihilar region.  IMPRESSION: 1. Central venous catheter in good position. 2. New slight haziness in the right perihilar region. The possibility of aspiration pneumonitis should be considered.   Electronically Signed   By: Rozetta Nunnery M.D.   On: 03/07/2013 13:12   Dg Chest Portable 1 View  03/07/2013   CLINICAL DATA:  Intubation.  EXAM: PORTABLE CHEST - 1 VIEW  COMPARISON:  None.  FINDINGS: Endotracheal tube tip in satisfactory position projecting approximately 4 cm above the carina. Nasogastric tube tip in the distal esophagus just above the EG junction. Prior sternotomy for CABG. Cardiac silhouette upper normal in size to slightly enlarged for AP portable technique. Linear atelectasis or scar in the lingula. Lungs otherwise clear. No  localized airspace consolidation. No pleural effusions. No pneumothorax. Normal pulmonary vascularity.  IMPRESSION: 1. Endotracheal tube tip in satisfactory position projecting approximately 4 cm above the carina. 2. Nasogastric tube tip in the distal esophagus just above the EG junction. This should be advanced several cm. 3. Borderline heart size. Linear atelectasis or scar in the lingula. No acute cardiopulmonary disease otherwise.   Electronically Signed   By: Evangeline Dakin M.D.   On: 03/07/2013 11:59   Dg Abd Portable 1v  03/07/2013   CLINICAL DATA:  Nasogastric tube placement  EXAM: PORTABLE ABDOMEN - 1 VIEW  COMPARISON:  Chest x-ray 03/07/2013  FINDINGS: A nasogastric tube is present. The tip and proximal side hole are noted within the gastric fundus. No evidence of bowel obstruction. Contrast material is noted in the right renal collecting system. Mild cardiomegaly and prior median sternotomy are noted. No free air. A right femoral approach central venous catheter is present. The catheter tip projects over the L5 vertebral body likely within the common iliac vein.  IMPRESSION: The nasogastric tube is well positioned in the gastric fundus.  The tip of a right femoral approach central venous catheter projects over the L5 pedicle likely in the common iliac vein.   Electronically Signed   By: Jacqulynn Cadet M.D.   On: 03/07/2013 18:44    2D ECHO: Study Conclusions  - Left ventricle: The cavity size was normal. Systolic function was normal. The estimated ejection fraction was in the range of 55% to 60%. Wall motion was normal; there were no regional wall motion abnormalities. - Mitral valve: Mild regurgitation. - Atrial septum: No defect or patent foramen ovale was identified. - Pulmonary arteries: Systolic pressure was moderately increased. PA peak pressure: 69mm Hg (S).    Disposition and Follow-up:     Discharge Orders   Future Appointments Provider Department Dept Phone    04/03/2013 3:30 PM [facility] [place] Office [phone]   06/24/2013 11:00 AM Deboraha Sprang, MD [facility] [phone]   Future Orders Complete By Expires   Diet Carb Modified  As directed    Discharge instructions  As directed    Comments:     NO DRIVING FOR 6 MONTHS.   Increase activity slowly  As directed        DISPOSITION: Home DIET: Carb modified diet  TESTS THAT NEED FOLLOW-UP BMET  DISCHARGE FOLLOW-UP Follow-up Information   Follow up with [facility]  [employer] On 04/03/2013. (At 3:30 PM for wound check)    Specialty:  Cardiology   Contact information:   [address] [place] [address] [phone]      Follow up with [name], MD On 06/24/2013. (At 11:00 AM)    Specialty:  Cardiology   Contact information:   [address] [address] [phone]       Follow up with Clent Demark, MD In 2 weeks. (for hospital follow-up, , obtain labs BMET)    Specialty:  Cardiology   Contact information:   [address] [place] [address] [phone]       Follow up with Wendie Simmer, MD. Schedule an appointment as soon as possible for a visit in 2 weeks. (for hospital follow-up)    Specialty:  Nurse Practitioner   Contact information:   [place] [address] [phone]       Follow up with [facility] Registered Nurse and [place])    Contact information:   [address] [place] [address] [phone]       Time spent on Discharge:  48 minutes  Signed:   RAI,RIPUDEEP M.D. [facility] 03/25/2013, 1:57 PM Pager: [phone]

## 2013-03-25 NOTE — Progress Notes (Signed)
Clinical Social Work Department BRIEF PSYCHOSOCIAL ASSESSMENT 03/25/2013  Patient:  Eric [name],Eric [name]     Account Number:  [id number]     Admit date:  03/07/2013  Clinical Social Worker:  Adair Laundry  Date/Time:  03/24/2013 04:15 PM  Referred by:  Physician  Date Referred:  03/24/2013 Referred for  SNF Placement   Other Referral:   Interview type:  Patient Other interview type:    PSYCHOSOCIAL DATA Living Status:  WIFE Admitted from facility:   Level of care:   Primary support name:  Carma Lair [phone] Primary support relationship to patient:  CHILD, ADULT Degree of support available:   Pt has supportive family    CURRENT CONCERNS Current Concerns  Post-Acute Placement   Other Concerns:    SOCIAL WORK ASSESSMENT / PLAN CSW aware that PT recommending CIR but pt has progressed to a point that CIR no longer feels pt is appropriate. CSW spoke with pt about dc plan. Pt confused as to what his options would be. CSW explained that if pt did not feel he had enough assistance at home he could potentially go to a SNF for ST rehab. Pt informed CSW he lives with his daughter and his wife and one of them would be home with him to provided 24 hr supervision. Pt not wanting to go to SNF unless this is the only option. Pt wanting CSW or RN CM to also speak with family to confirm they are comfortable with home dc as well. CSW notified RN CM. At this time, it seems as though pt has no further CSW needs.   Assessment/plan status:  No Further Intervention Required Other assessment/ plan:   Information/referral to community resources:   None needed    PATIENT'S/FAMILY'S RESPONSE TO PLAN OF CARE: Pt is wanting to [place] home       [place], [place]

## 2013-03-25 NOTE — Progress Notes (Signed)
NUTRITION FOLLOW UP  Intervention:   - Continue to encourage adequate PO intake.  - RD to continue to follow for nutrition adequacy.   Nutrition Dx:   Inadequate oral intake, ongoing  Goal:   Patient will meet >/=90% of estimated nutrition needs, meeting  Monitor:   Weight, labs, PO intake, I/Os  Assessment:   PMHx significant for HTN, HLD, DM. Admitted s/p witness seizure and cardiac arrest at [facility], required CPR x 5 minutes, pt was intubated in ED and coded again while there. Pt went to cath lab urgently on admission. Findings reveal severe native vessl CAD.   2/17-Patient is currently intubated on ventilator support. Discussed with RN who states attempting to wean from propofol, however pt to remain intubated throughout 2/17 so propofol may be variable.   2/19-Pt extubated 2/18 currently on nasal cannula. Discussed with RN who had an order to place a tube and has been NPO x24 hrs. Pt failed swallow test with severe aspiration risk per SLP. SLP to repeat evaluation test tomorrow morning and may recommend objective evaluation if indicated which would require removal of NGT. RD recommends holding off on tube placement for another 24 hours, if tube was solely to be used for nutrition purposes. If tube placement is warranted for PO meds, orders will be placed so that nutrition can be provided as well.  Pt without s/s of inadequate nutrition PTA, and was maintained on tube feeding prior to extubation. CBGs have been elevated.   2/24- Pt tolerating diet. Consumed 100% of breakfast this AM. RD will continue to follow for adequacy of intake.   3/3 - Diet advanced to Dysphagia 3, thin, carbohydrate modified. Patient continues to tolerate with 100% intake of meals.  Patient is s/p ICD implant yesterday.   Height: Ht Readings from Last 1 Encounters:  03/07/13 6' (1.829 m)    Weight Status:   Wt Readings from Last 1 Encounters:  03/25/13 156 lb 8.4 oz (71 kg)    Re-estimated  needs:  Kcal: 2000-2200  Protein: 95-110 grams  Fluid: 2 L-2.2 L/day  Skin: Intact  Diet Order: Dysphagia 3, thin carbohydrate modified  No intake or output data in the 24 hours ending 03/25/13 1352  Last BM: 3/3   Labs:   Recent Labs Lab 03/20/13 0520 03/21/13 0335 03/24/13 1145  NA 134* 136* 138  K 4.4 4.8 5.6*  CL 99 102 104  CO2 27 26 26   BUN 21 21 20   CREATININE 0.87 0.83 1.05  CALCIUM 8.2* 8.7 8.8  GLUCOSE 164* 149* 141*    CBG (last 3)   Recent Labs  03/24/13 2108 03/25/13 0732 03/25/13 1139  GLUCAP 109* 145* 178*    Scheduled Meds: . amLODipine  10 mg Oral Daily  . antiseptic oral rinse  15 mL Mouth Rinse BID  . aspirin  81 mg Oral Daily  . atorvastatin  40 mg Oral q1800  . heparin subcutaneous  5,000 Units Subcutaneous 3 times per day  . insulin aspart  0-9 Units Subcutaneous TID WC  . insulin aspart  3 Units Subcutaneous TID WC  . isosorbide mononitrate  60 mg Oral Daily  . metoprolol tartrate  75 mg Oral TID  . QUEtiapine  12.5 mg Oral QHS    Continuous Infusions: . sodium chloride 10 mL/hr at 03/17/13 1500    Larey Seat, RD, LDN Pager #: [place] Pager #: [phone]

## 2013-03-25 NOTE — Discharge Instructions (Signed)
° °  Supplemental Discharge Instructions for  Pacemaker/Defibrillator Patients  Activity No heavy lifting or vigorous activity with your left/right arm for 6 to 8 weeks.  Do not raise your left/right arm above your head for one week.  Gradually raise your affected arm as drawn below.           03/05                      03/06                        03/07                     03/08       NO DRIVING for 6 months, until given clearance by Dr. Caryl Comes. WOUND CARE   Keep the wound area clean and dry. You may shower but no soaking in tub bath, swimming pool or hot tub for 7-10 days, until wound is completely healed.    The Dermabond (glue) on your wound will fall off on its own; do not pull it off.  No bandage is needed on the site.  DO  NOT apply any creams, oils, or ointments to the wound area.   If you notice any drainage or discharge from the wound, any swelling or bruising at the site, or you develop a fever > 101? F after you are discharged home, call the office at once.  Special Instructions   You are still able to use cellular telephones; use the ear opposite the side where you have your pacemaker/defibrillator.  Avoid carrying your cellular phone near your device.   When traveling through airports, show security personnel your identification card to avoid being screened in the metal detectors.  Ask the security personnel to use the hand wand.   Avoid arc welding equipment, MRI testing (magnetic resonance imaging), TENS units (transcutaneous nerve stimulators).  Call the office for questions about other devices.   Avoid electrical appliances that are in poor condition or are not properly grounded.   Microwave ovens are safe to be near or to operate.  Additional information for defibrillator patients should your device go off:   If your device goes off ONCE and you feel fine afterward, notify the device clinic nurses.   If your device goes off ONCE and you do not feel well afterward, call  911.   If your device goes off TWICE, call 911.   If your device goes off THREE times in one day, call 911.  DO NOT DRIVE YOURSELF OR A FAMILY MEMBER WITH A DEFIBRILLATOR TO THE HOSPITAL--CALL 911.

## 2013-03-25 NOTE — Care Management Note (Addendum)
    Page 1 of 2   03/25/2013     1:57:51 PM   CARE MANAGEMENT NOTE 03/25/2013  Patient:  Eric [name],Eric [name]   Account Number:  [id number]  Date Initiated:  03/11/2013  Documentation initiated by:  Elissa Hefty  Subjective/Objective Assessment:   adm w cardiac arrest -vent     Action/Plan:   lives alone   Anticipated DC Date:     Anticipated DC Plan:           Choice offered to / List presented to:          [facility] arranged  HH-1 RN  [place].   Status of service:  Completed, signed off Medicare Important Message given?   (If response is "NO", the following Medicare IM given date fields will be blank) Date Medicare IM given:   Date Additional Medicare IM given:    Discharge Disposition:  [place]  Per UR Regulation:  Reviewed for med. necessity/level of care/duration of stay  If discussed at [place] of Stay Meetings, dates discussed:   03/13/2013  03/18/2013  03/20/2013  03/25/2013    Comments:  Minette BrineKeola, [place] Other [phone]  03-25-13 [address], RN, BSN [phone] Referral made with [facility] for Services. SOC to begin within 24-48 hrs post d/c.  03-14-13 [place] on 6 liters [place] and on nicaripine drip - still encephalopic but better from event.  Will need PT/OT eval when able.  CM will continue to follow.

## 2013-03-25 NOTE — Progress Notes (Signed)
D/c orders received;IV removed with gauze on, pt remains in stable condition, pt meds and instructions reviewed and given to pt; pt d/c to home 

## 2013-04-01 ENCOUNTER — Encounter (HOSPITAL_COMMUNITY): Payer: Self-pay | Admitting: *Deleted

## 2013-04-03 ENCOUNTER — Ambulatory Visit (INDEPENDENT_AMBULATORY_CARE_PROVIDER_SITE_OTHER): Payer: Medicaid Other | Admitting: *Deleted

## 2013-04-03 DIAGNOSIS — I469 Cardiac arrest, cause unspecified: Secondary | ICD-10-CM

## 2013-04-03 DIAGNOSIS — I4901 Ventricular fibrillation: Secondary | ICD-10-CM

## 2013-04-03 LAB — MDC_IDC_ENUM_SESS_TYPE_INCLINIC
Battery Remaining Longevity: 102 mo
Brady Statistic RV Percent Paced: 0 %
HighPow Impedance: 46.125
Lead Channel Pacing Threshold Pulse Width: 0.5 ms
Lead Channel Setting Pacing Amplitude: 3.5 V
Lead Channel Setting Pacing Pulse Width: 0.5 ms
MDC IDC MSMT LEADCHNL RV IMPEDANCE VALUE: 362.5 Ohm
MDC IDC MSMT LEADCHNL RV PACING THRESHOLD AMPLITUDE: 0.75 V
MDC IDC MSMT LEADCHNL RV SENSING INTR AMPL: 11.8 mV
MDC IDC PG SERIAL: 7175540
MDC IDC SESS DTM: 20150312153943
MDC IDC SET LEADCHNL RV SENSING SENSITIVITY: 0.5 mV
MDC IDC SET ZONE DETECTION INTERVAL: 300 ms
MDC IDC SET ZONE DETECTION INTERVAL: 375 ms
Zone Setting Detection Interval: 250 ms

## 2013-04-03 NOTE — Progress Notes (Signed)
Wound check appointment. Steri-strips removed. Wound without redness or edema. Incision edges approximated, wound well healed. Normal device function. Thresholds, sensing, and impedances consistent with implant measurements. Device programmed at 3.5V for extra safety margin until 3 month visit. Histogram distribution appropriate for patient and level of activity. No ventricular arrhythmias noted. Patient educated about wound care, arm mobility, lifting restrictions, shock plan. ROV in 3 months with SK.

## 2013-04-27 ENCOUNTER — Inpatient Hospital Stay (HOSPITAL_COMMUNITY)
Admission: EM | Admit: 2013-04-27 | Discharge: 2013-05-02 | DRG: 193 | Disposition: A | Discharge status: Home / Self Care | Payer: Medicaid Other | Attending: Internal Medicine | Admitting: Internal Medicine

## 2013-04-27 ENCOUNTER — Emergency Department (HOSPITAL_COMMUNITY): Payer: Medicaid Other

## 2013-04-27 ENCOUNTER — Encounter (HOSPITAL_COMMUNITY): Payer: Self-pay | Admitting: Emergency Medicine

## 2013-04-27 DIAGNOSIS — Z79899 Other long term (current) drug therapy: Secondary | ICD-10-CM

## 2013-04-27 DIAGNOSIS — E119 Type 2 diabetes mellitus without complications: Secondary | ICD-10-CM

## 2013-04-27 DIAGNOSIS — R569 Unspecified convulsions: Secondary | ICD-10-CM | POA: Diagnosis present

## 2013-04-27 DIAGNOSIS — R079 Chest pain, unspecified: Secondary | ICD-10-CM

## 2013-04-27 DIAGNOSIS — E1169 Type 2 diabetes mellitus with other specified complication: Secondary | ICD-10-CM | POA: Diagnosis present

## 2013-04-27 DIAGNOSIS — R509 Fever, unspecified: Secondary | ICD-10-CM

## 2013-04-27 DIAGNOSIS — J1100 Influenza due to unidentified influenza virus with unspecified type of pneumonia: Principal | ICD-10-CM | POA: Diagnosis present

## 2013-04-27 DIAGNOSIS — G911 Obstructive hydrocephalus: Secondary | ICD-10-CM | POA: Diagnosis present

## 2013-04-27 DIAGNOSIS — I2510 Atherosclerotic heart disease of native coronary artery without angina pectoris: Secondary | ICD-10-CM | POA: Diagnosis present

## 2013-04-27 DIAGNOSIS — Z7982 Long term (current) use of aspirin: Secondary | ICD-10-CM

## 2013-04-27 DIAGNOSIS — E785 Hyperlipidemia, unspecified: Secondary | ICD-10-CM | POA: Diagnosis present

## 2013-04-27 DIAGNOSIS — J111 Influenza due to unidentified influenza virus with other respiratory manifestations: Secondary | ICD-10-CM

## 2013-04-27 DIAGNOSIS — J9600 Acute respiratory failure, unspecified whether with hypoxia or hypercapnia: Secondary | ICD-10-CM | POA: Diagnosis present

## 2013-04-27 DIAGNOSIS — Z418 Encounter for other procedures for purposes other than remedying health state: Secondary | ICD-10-CM

## 2013-04-27 DIAGNOSIS — J9601 Acute respiratory failure with hypoxia: Secondary | ICD-10-CM | POA: Diagnosis present

## 2013-04-27 DIAGNOSIS — Z2989 Encounter for other specified prophylactic measures: Secondary | ICD-10-CM

## 2013-04-27 DIAGNOSIS — Z95810 Presence of automatic (implantable) cardiac defibrillator: Secondary | ICD-10-CM

## 2013-04-27 DIAGNOSIS — E871 Hypo-osmolality and hyponatremia: Secondary | ICD-10-CM | POA: Diagnosis present

## 2013-04-27 DIAGNOSIS — D61818 Other pancytopenia: Secondary | ICD-10-CM | POA: Diagnosis not present

## 2013-04-27 DIAGNOSIS — G40909 Epilepsy, unspecified, not intractable, without status epilepticus: Secondary | ICD-10-CM | POA: Diagnosis present

## 2013-04-27 DIAGNOSIS — Z951 Presence of aortocoronary bypass graft: Secondary | ICD-10-CM

## 2013-04-27 DIAGNOSIS — J189 Pneumonia, unspecified organism: Secondary | ICD-10-CM | POA: Diagnosis present

## 2013-04-27 DIAGNOSIS — Z794 Long term (current) use of insulin: Secondary | ICD-10-CM

## 2013-04-27 DIAGNOSIS — E11649 Type 2 diabetes mellitus with hypoglycemia without coma: Secondary | ICD-10-CM

## 2013-04-27 DIAGNOSIS — Z8674 Personal history of sudden cardiac arrest: Secondary | ICD-10-CM

## 2013-04-27 DIAGNOSIS — I10 Essential (primary) hypertension: Secondary | ICD-10-CM

## 2013-04-27 LAB — CBC WITH DIFFERENTIAL/PLATELET
Basophils Absolute: 0 10*3/uL (ref 0.0–0.1)
Basophils Relative: 0 % (ref 0–1)
EOS PCT: 0 % (ref 0–5)
Eosinophils Absolute: 0 10*3/uL (ref 0.0–0.7)
HCT: 30.8 % — ABNORMAL LOW (ref 39.0–52.0)
HEMOGLOBIN: 11 g/dL — AB (ref 13.0–17.0)
LYMPHS ABS: 1.2 10*3/uL (ref 0.7–4.0)
Lymphocytes Relative: 13 % (ref 12–46)
MCH: 31.5 pg (ref 26.0–34.0)
MCHC: 35.7 g/dL (ref 30.0–36.0)
MCV: 88.3 fL (ref 78.0–100.0)
MONOS PCT: 11 % (ref 3–12)
Monocytes Absolute: 1 10*3/uL (ref 0.1–1.0)
Neutro Abs: 6.8 10*3/uL (ref 1.7–7.7)
Neutrophils Relative %: 76 % (ref 43–77)
Platelets: 130 10*3/uL — ABNORMAL LOW (ref 150–400)
RBC: 3.49 MIL/uL — ABNORMAL LOW (ref 4.22–5.81)
RDW: 12.4 % (ref 11.5–15.5)
WBC: 9 10*3/uL (ref 4.0–10.5)

## 2013-04-27 LAB — COMPREHENSIVE METABOLIC PANEL
ALT: 15 U/L (ref 0–53)
AST: 20 U/L (ref 0–37)
Albumin: 2.9 g/dL — ABNORMAL LOW (ref 3.5–5.2)
Alkaline Phosphatase: 47 U/L (ref 39–117)
BILIRUBIN TOTAL: 0.7 mg/dL (ref 0.3–1.2)
BUN: 23 mg/dL (ref 6–23)
CO2: 23 meq/L (ref 19–32)
Calcium: 8.6 mg/dL (ref 8.4–10.5)
Chloride: 90 mEq/L — ABNORMAL LOW (ref 96–112)
Creatinine, Ser: 1.33 mg/dL (ref 0.50–1.35)
GFR calc Af Amer: 66 mL/min — ABNORMAL LOW (ref >90)
GFR, EST NON AFRICAN AMERICAN: 57 mL/min — AB (ref >90)
GLUCOSE: 163 mg/dL — AB (ref 70–99)
Potassium: 4.4 mEq/L (ref 3.7–5.3)
SODIUM: 128 meq/L — AB (ref 137–147)
Total Protein: 8.5 g/dL — ABNORMAL HIGH (ref 6.0–8.3)

## 2013-04-27 MED ORDER — ACETAMINOPHEN 325 MG PO TABS
650.0000 mg | ORAL_TABLET | Freq: Once | ORAL | Status: AC
  Administered 2013-04-27: 650 mg via ORAL

## 2013-04-27 NOTE — ED Notes (Signed)
The pt has had a cold and productive cough for 2 days with a temp.  No tylenol or advil for the temp

## 2013-04-27 NOTE — ED Notes (Signed)
Nurse first rounds ; nurse explained delay / process and wait tine to pt. respirations unlabored / no distress.

## 2013-04-28 ENCOUNTER — Encounter (HOSPITAL_COMMUNITY): Payer: Self-pay | Admitting: Internal Medicine

## 2013-04-28 DIAGNOSIS — I10 Essential (primary) hypertension: Secondary | ICD-10-CM

## 2013-04-28 DIAGNOSIS — E785 Hyperlipidemia, unspecified: Secondary | ICD-10-CM | POA: Diagnosis present

## 2013-04-28 DIAGNOSIS — J9600 Acute respiratory failure, unspecified whether with hypoxia or hypercapnia: Secondary | ICD-10-CM | POA: Insufficient documentation

## 2013-04-28 DIAGNOSIS — J189 Pneumonia, unspecified organism: Secondary | ICD-10-CM

## 2013-04-28 DIAGNOSIS — E119 Type 2 diabetes mellitus without complications: Secondary | ICD-10-CM

## 2013-04-28 DIAGNOSIS — R509 Fever, unspecified: Secondary | ICD-10-CM

## 2013-04-28 LAB — RESPIRATORY VIRUS PANEL
Adenovirus: NOT DETECTED
INFLUENZA A H3: NOT DETECTED
INFLUENZA A: NOT DETECTED
Influenza A H1: NOT DETECTED
Influenza B: DETECTED — AB
Metapneumovirus: NOT DETECTED
PARAINFLUENZA 1 A: NOT DETECTED
PARAINFLUENZA 2 A: NOT DETECTED
Parainfluenza 3: NOT DETECTED
RESPIRATORY SYNCYTIAL VIRUS A: NOT DETECTED
RESPIRATORY SYNCYTIAL VIRUS B: NOT DETECTED
RHINOVIRUS: NOT DETECTED

## 2013-04-28 LAB — BASIC METABOLIC PANEL
BUN: 26 mg/dL — ABNORMAL HIGH (ref 6–23)
CALCIUM: 8.6 mg/dL (ref 8.4–10.5)
CO2: 23 mEq/L (ref 19–32)
Chloride: 93 mEq/L — ABNORMAL LOW (ref 96–112)
Creatinine, Ser: 1.05 mg/dL (ref 0.50–1.35)
GFR calc Af Amer: 88 mL/min — ABNORMAL LOW (ref >90)
GFR, EST NON AFRICAN AMERICAN: 76 mL/min — AB (ref >90)
Glucose, Bld: 112 mg/dL — ABNORMAL HIGH (ref 70–99)
POTASSIUM: 3.7 meq/L (ref 3.7–5.3)
SODIUM: 131 meq/L — AB (ref 137–147)

## 2013-04-28 LAB — GLUCOSE, CAPILLARY
GLUCOSE-CAPILLARY: 59 mg/dL — AB (ref 70–99)
GLUCOSE-CAPILLARY: 106 mg/dL — AB (ref 70–99)
GLUCOSE-CAPILLARY: 77 mg/dL (ref 70–99)
Glucose-Capillary: 100 mg/dL — ABNORMAL HIGH (ref 70–99)
Glucose-Capillary: 57 mg/dL — ABNORMAL LOW (ref 70–99)
Glucose-Capillary: 74 mg/dL (ref 70–99)

## 2013-04-28 LAB — INFLUENZA PANEL BY PCR (TYPE A & B)
H1N1 flu by pcr: NOT DETECTED
Influenza A By PCR: NEGATIVE
Influenza B By PCR: POSITIVE — AB

## 2013-04-28 LAB — CBC
HCT: 31.2 % — ABNORMAL LOW (ref 39.0–52.0)
HEMOGLOBIN: 11 g/dL — AB (ref 13.0–17.0)
MCH: 31.1 pg (ref 26.0–34.0)
MCHC: 35.3 g/dL (ref 30.0–36.0)
MCV: 88.1 fL (ref 78.0–100.0)
PLATELETS: 125 10*3/uL — AB (ref 150–400)
RBC: 3.54 MIL/uL — AB (ref 4.22–5.81)
RDW: 12.3 % (ref 11.5–15.5)
WBC: 7.1 10*3/uL (ref 4.0–10.5)

## 2013-04-28 LAB — STREP PNEUMONIAE URINARY ANTIGEN: Strep Pneumo Urinary Antigen: NEGATIVE

## 2013-04-28 LAB — LEGIONELLA ANTIGEN, URINE: LEGIONELLA ANTIGEN, URINE: NEGATIVE

## 2013-04-28 MED ORDER — GLIPIZIDE 5 MG PO TABS
5.0000 mg | ORAL_TABLET | Freq: Every day | ORAL | Status: DC
  Administered 2013-04-28: 5 mg via ORAL

## 2013-04-28 MED ORDER — ISOSORBIDE MONONITRATE ER 60 MG PO TB24
60.0000 mg | ORAL_TABLET | Freq: Every day | ORAL | Status: DC
  Administered 2013-04-28 – 2013-05-02 (×5): 60 mg via ORAL
  Filled 2013-04-28 (×5): qty 1

## 2013-04-28 MED ORDER — SIMVASTATIN 20 MG PO TABS
20.0000 mg | ORAL_TABLET | Freq: Every day | ORAL | Status: DC
  Administered 2013-04-28 – 2013-04-29 (×2): 20 mg via ORAL
  Filled 2013-04-28 (×3): qty 1

## 2013-04-28 MED ORDER — ASPIRIN EC 81 MG PO TBEC
81.0000 mg | DELAYED_RELEASE_TABLET | Freq: Every day | ORAL | Status: DC
  Administered 2013-04-28 – 2013-05-02 (×5): 81 mg via ORAL
  Filled 2013-04-28 (×5): qty 1

## 2013-04-28 MED ORDER — DEXTROSE 5 % IV SOLN
1.0000 g | Freq: Once | INTRAVENOUS | Status: AC
  Administered 2013-04-28: 1 g via INTRAVENOUS
  Filled 2013-04-28: qty 1

## 2013-04-28 MED ORDER — GUAIFENESIN ER 600 MG PO TB12
600.0000 mg | ORAL_TABLET | Freq: Two times a day (BID) | ORAL | Status: DC
  Administered 2013-04-28 – 2013-05-02 (×9): 600 mg via ORAL
  Filled 2013-04-28 (×10): qty 1

## 2013-04-28 MED ORDER — VANCOMYCIN HCL IN DEXTROSE 750-5 MG/150ML-% IV SOLN: 750.0000 mg | Freq: Two times a day (BID) | INTRAVENOUS | Status: DC

## 2013-04-28 MED ORDER — ENOXAPARIN SODIUM 40 MG/0.4ML ~~LOC~~ SOLN
40.0000 mg | SUBCUTANEOUS | Status: DC
  Administered 2013-04-28 – 2013-05-02 (×5): 40 mg via SUBCUTANEOUS
  Filled 2013-04-28 (×5): qty 0.4

## 2013-04-28 MED ORDER — OSELTAMIVIR PHOSPHATE 75 MG PO CAPS
75.0000 mg | ORAL_CAPSULE | Freq: Two times a day (BID) | ORAL | Status: DC
  Administered 2013-04-28 – 2013-05-02 (×8): 75 mg via ORAL
  Filled 2013-04-28 (×9): qty 1

## 2013-04-28 MED ORDER — LISINOPRIL 5 MG PO TABS
5.0000 mg | ORAL_TABLET | Freq: Every day | ORAL | Status: DC
  Administered 2013-04-28 – 2013-05-02 (×5): 5 mg via ORAL
  Filled 2013-04-28 (×6): qty 1

## 2013-04-28 MED ORDER — INSULIN ASPART 100 UNIT/ML ~~LOC~~ SOLN
0.0000 [IU] | Freq: Three times a day (TID) | SUBCUTANEOUS | Status: DC
  Administered 2013-04-30: 1 [IU] via SUBCUTANEOUS
  Administered 2013-05-01: 3 [IU] via SUBCUTANEOUS

## 2013-04-28 MED ORDER — SODIUM CHLORIDE 0.9 % IV SOLN
1250.0000 mg | Freq: Once | INTRAVENOUS | Status: AC
  Administered 2013-04-28: 1250 mg via INTRAVENOUS
  Filled 2013-04-28: qty 1250

## 2013-04-28 MED ORDER — METOPROLOL TARTRATE 50 MG PO TABS
50.0000 mg | ORAL_TABLET | Freq: Two times a day (BID) | ORAL | Status: DC
  Administered 2013-04-28 – 2013-05-02 (×9): 50 mg via ORAL
  Filled 2013-04-28 (×10): qty 1

## 2013-04-28 MED ORDER — SODIUM CHLORIDE 0.9 % IV SOLN
INTRAVENOUS | Status: AC
  Administered 2013-04-28: 50 mL/h via INTRAVENOUS

## 2013-04-28 MED ORDER — AMLODIPINE BESYLATE 10 MG PO TABS
10.0000 mg | ORAL_TABLET | Freq: Every day | ORAL | Status: DC
  Administered 2013-04-28 – 2013-05-02 (×5): 10 mg via ORAL
  Filled 2013-04-28 (×5): qty 1

## 2013-04-28 MED ORDER — SODIUM CHLORIDE 0.9 % IV SOLN: INTRAVENOUS | Status: DC

## 2013-04-28 MED ORDER — DEXTROSE 5 % IV SOLN
1.0000 g | Freq: Three times a day (TID) | INTRAVENOUS | Status: DC
  Administered 2013-04-28 – 2013-05-01 (×9): 1 g via INTRAVENOUS
  Filled 2013-04-28 (×11): qty 1

## 2013-04-28 MED ORDER — GUAIFENESIN-DM 100-10 MG/5ML PO SYRP: 5.0000 mL | ORAL_SOLUTION | ORAL | Status: DC | PRN

## 2013-04-28 MED ORDER — PNEUMOCOCCAL VAC POLYVALENT 25 MCG/0.5ML IJ INJ
0.5000 mL | INJECTION | INTRAMUSCULAR | Status: AC
  Administered 2013-04-29: 0.5 mL via INTRAMUSCULAR
  Filled 2013-04-28: qty 0.5

## 2013-04-28 MED ORDER — INSULIN ASPART 100 UNIT/ML ~~LOC~~ SOLN: 0.0000 [IU] | Freq: Every day | SUBCUTANEOUS | Status: DC

## 2013-04-28 MED ORDER — VANCOMYCIN HCL IN DEXTROSE 1-5 GM/200ML-% IV SOLN
1000.0000 mg | Freq: Two times a day (BID) | INTRAVENOUS | Status: DC
  Administered 2013-04-29 – 2013-05-01 (×5): 1000 mg via INTRAVENOUS
  Filled 2013-04-28 (×6): qty 200

## 2013-04-28 MED ORDER — ACETAMINOPHEN 325 MG PO TABS
650.0000 mg | ORAL_TABLET | Freq: Four times a day (QID) | ORAL | Status: DC | PRN
  Administered 2013-04-28 – 2013-05-01 (×8): 650 mg via ORAL
  Filled 2013-04-28 (×8): qty 2

## 2013-04-28 NOTE — ED Notes (Signed)
Patient placed in fast track 8.  Orders to be placed in computer for patient.

## 2013-04-28 NOTE — ED Notes (Signed)
Called Lab regarding first set of blood cultures.  Told by lab that they never received the blood cultures that were sent.  Explained to them that we were sending everything to #68 tube system.  They stated they do not have a #68.  They in turn called facilities

## 2013-04-28 NOTE — Progress Notes (Signed)
ANTIBIOTIC CONSULT NOTE - INITIAL  Pharmacy Consult for Vancomycin  Indication: rule out pneumonia  No Known Allergies  Patient Measurements: Height: 5\' 6"  (167.6 cm) Weight: 155 lb 5 oz (70.449 kg) IBW/kg (Calculated) : 63.8  Vital Signs: Temp: 100.4 F (38 C) (04/05 2129) Temp src: Oral (04/05 2129) BP: 131/68 mmHg (04/05 2129) Pulse Rate: 91 (04/05 2129)  Labs:  Recent Labs  04/27/13 2032  WBC 9.0  HGB 11.0*  PLT 130*  CREATININE 1.33   Estimated Creatinine Clearance: 54 ml/min (by C-G formula based on Cr of 1.33).  Medical History: Past Medical History  Diagnosis Date  . Hypertension   . Hyperlipemia   . 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 Edema   . Cardiac arrest due to underlying cardiac condition    Assessment: 58 y/o M with recent admission for cardiac arrest, here with possible PNA on CXR, WBC 6.9, renal function ok, Tmax 10.25, other labs as above.   Goal of Therapy:  Vancomycin trough level 15-20 mcg/ml  Plan:  -Vancomycin 1250 mg IV x 1, then 750 mg IV q12h -F/U gram negative coverage -Trend WBC, temp, renal function  -Drug levels as indicated   Narda Bonds 04/28/2013,12:47 AM

## 2013-04-28 NOTE — ED Provider Notes (Signed)
CSN: [id number]     Arrival date & time 04/27/13  1858 History   First MD Initiated Contact with Patient 04/28/13 0007     Chief Complaint  Patient presents with  . cough temp      (Consider location/radiation/quality/duration/timing/severity/associated sxs/prior Treatment) Patient is a 58 y.o. male presenting with fever. The history is provided by the patient and the spouse. No language interpreter was used.  Fever Severity:  Moderate Duration:  3 days Progression:  Worsening Associated symptoms: chest pain, chills, congestion, cough, myalgias, sore throat and vomiting   Associated symptoms: no confusion, no dysuria and no rash   Associated symptoms comment:  Cough, fever, congestion and shortness of breath for the past 3 days. He had limited vomiting that started today. He reports normal appetite. Per family, there has been no confusion or AMS. He was recently in the hospital for respiratory failure, klebisella pneumonia and defibrillator placement, having been discharged home on 03/25/13. He has been doing well until 3 days ago when current symptoms began. He reports family members with similar illness at home.    Past Medical History  Diagnosis Date  . Hypertension   . Hyperlipemia   . 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 Edema   . Cardiac arrest due to underlying cardiac condition    Past Surgical History  Procedure Laterality Date  . Coronary artery bypass graft    . Implantable cardioverter defibrillator implant  03/24/2013    STJ single chamber ICD implanted by Dr Caryl Comes for secondary prevention   No family history on file. History  Substance Use Topics  . Smoking status: Never Smoker   . Smokeless tobacco: Not on file  . Alcohol Use: No    Review of Systems  Constitutional: Positive for fever and chills.  HENT: Positive for congestion and sore throat.   Respiratory: Positive for cough and shortness of breath.   Cardiovascular: Positive for  chest pain.  Gastrointestinal: Positive for vomiting. Negative for abdominal pain.  Genitourinary: Negative for dysuria.  Musculoskeletal: Positive for myalgias.  Skin: Negative for rash.  Neurological: Positive for weakness.  Psychiatric/Behavioral: Negative for confusion.      Allergies  Review of patient's allergies indicates no known allergies.  Home Medications   Current Outpatient Rx  Name  Route  Sig  Dispense  Refill  . amLODipine (NORVASC) 10 MG tablet   Oral   Take 10 mg by mouth daily.         Marland Kitchen aspirin EC 81 MG tablet   Oral   Take 81 mg by mouth daily.         Marland Kitchen glipiZIDE (GLUCOTROL) 5 MG tablet   Oral   Take 5 mg by mouth daily before breakfast.         . isosorbide mononitrate (IMDUR) 60 MG 24 hr tablet   Oral   Take 1 tablet (60 mg total) by mouth daily.   30 tablet   3   . lisinopril (PRINIVIL) 5 MG tablet   Oral   Take 1 tablet (5 mg total) by mouth daily.   30 tablet   3   . metFORMIN (GLUMETZA) 500 MG (MOD) 24 hr tablet   Oral   Take 500 mg by mouth daily with breakfast.         . metoprolol tartrate (LOPRESSOR) 25 MG tablet   Oral   Take 50 mg by mouth 2 (two) times daily.         Marland Kitchen  simvastatin (ZOCOR) 20 MG tablet   Oral   Take 20 mg by mouth daily.          BP 131/68  Pulse 91  Temp(Src) 100.4 F (38 C) (Oral)  Resp 14  Ht 5\' 6"  (1.676 m)  Wt 155 lb 5 oz (70.449 kg)  BMI 25.08 kg/m2  SpO2 97% Physical Exam  Constitutional: He is oriented to person, place, and time. He appears well-developed and well-nourished.  HENT:  Head: Normocephalic.  Neck: Normal range of motion. Neck supple.  Cardiovascular: Normal rate and regular rhythm.   No murmur heard. Pulmonary/Chest: Effort normal. No respiratory distress. He has rales. He exhibits tenderness.  Coarse rhonchi left lower and mid-lung fields.   Abdominal: Soft. Bowel sounds are normal. There is no tenderness. There is no rebound and no guarding.  Musculoskeletal:  Normal range of motion. He exhibits no edema.  Lymphadenopathy:    He has no cervical adenopathy.  Neurological: He is alert and oriented to person, place, and time.  Skin: Skin is warm and dry. No rash noted.  Psychiatric: He has a normal mood and affect.    ED Course  Procedures (including critical care time) Labs Review Labs Reviewed  CBC WITH DIFFERENTIAL - Abnormal; Notable for the following:    RBC 3.49 (*)    Hemoglobin 11.0 (*)    HCT 30.8 (*)    Platelets 130 (*)    All other components within normal limits  COMPREHENSIVE METABOLIC PANEL - Abnormal; Notable for the following:    Sodium 128 (*)    Chloride 90 (*)    Glucose, Bld 163 (*)    Total Protein 8.5 (*)    Albumin 2.9 (*)    GFR calc non Af Amer 57 (*)    GFR calc Af Amer 66 (*)    All other components within normal limits  CULTURE, BLOOD (ROUTINE X 2)  CULTURE, BLOOD (ROUTINE X 2)   Imaging Review Dg Chest 2 View  04/27/2013   CLINICAL DATA:  Productive cough and fever  EXAM: CHEST  2 VIEW  COMPARISON:  March 25, 2013  FINDINGS: There is patchy infiltrate in the left base. Lungs are otherwise clear. Heart size and pulmonary vascularity are normal. No adenopathy. Patient is status post median sternotomy. Pacemaker lead tip is attached to the right ventricle.  IMPRESSION: Patchy infiltrate left base.   Electronically Signed   By: Lowella Grip M.D.   On: 04/27/2013 19:52     EKG Interpretation None      MDM   Final diagnoses:  None    1. HCAP 2. Hyponatremia  Recent hospitalization with current pneumonia, history of respiratory failure and recent pneumonia. Patient's vital signs stable. Contact hospitalist for admission.    Dewaine Oats, PA-C 04/28/13 0147

## 2013-04-28 NOTE — Progress Notes (Signed)
Md made aware of the low blood sugar.Will review meds.

## 2013-04-28 NOTE — Progress Notes (Signed)
PROGRESS NOTE    Eric [name] [id number] DOB: 11/18/1955 DOA: 04/27/2013 PCP: Wendie Simmer, MD  HPI/Brief narrative 58 y.o. 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 In the ED, patient had temperature of 102.5 and chest x-ray showed patchy infiltrate in the left base. Patient has other family members with upper respiratory tract illness. He was admitted for pneumonia. Influenza B +   Assessment/Plan:  1. Healthcare associated pneumonia/influenza: Continue droplet isolation. Continue IV vancomycin and cefepime. Started Tamiflu 75 mg by mouth twice a day. Urine Legionella and streptococcal antigen: Negative. Follow up blood culture results. Will need followup chest x-ray to ensure resolution of pneumonia findings. 2. Influenza with respiratory illness: Management as above. 3. Acute respiratory failure: Secondary to pneumonia and influenza. Oxygen, bronchodilator nebulizations. 4. Type II DM with hypoglycemic episodes: DC oral hypoglycemics. Continue SSI. Follow closely and treat by hypoglycemia protocol. 5. Dehydration with hyponatremia: Brief IV normal saline. 6. Hypertension: Controlled. 7. Hyperlipidemia: Continue statins 8. Recent history of recurrent V. fib cardiac arrest, status post ICD: Stable. Monitor on telemetry. 9. Anemia and thrombocytopenia: Stable. Follow CBCs periodically. Thrombocytopenia may be related to influenza. 10. History of seizures: Not an epileptic. Monitor   Code Status: Full Family Communication: None at bedside Disposition Plan: Remains inpatient. Home when medically stable   Consultants:  None  Procedures:  None  Antibiotics:  IV cefepime 4/6 >  IV vancomycin 4/6 >  Oral Tamiflu 4/6   Subjective: Patient feels  better. Decreased chest congestion. Denies chest pain or dyspnea.  Objective: Filed Vitals:   04/27/13 2129 04/28/13 0213 04/28/13 0409 04/28/13 1505  BP: 131/68 125/75 152/82 99/57  Pulse: 91 94 102 81  Temp: 100.4 F (38 C) 100.4 F (38 C) 99.5 F (37.5 C) 98.6 F (37 C)  TempSrc: Oral Oral Oral Oral  Resp: 14 18 20 17   Height:      Weight:      SpO2: 97% 98% 95% 97%   No intake or output data in the 24 hours ending 04/28/13 1701 Filed Weights   04/27/13 1908  Weight: 70.449 kg (155 lb 5 oz)     Exam:  General exam: Pleasant middle-aged male lying comfortably in bed Respiratory system: Reduced breath sounds in the bases with occasional basal crackles. Rest of lung fields clear to auscultation. No increased work of breathing. Cardiovascular system: S1 & S2 heard, RRR. No JVD, murmurs, gallops, clicks or pedal edema. Telemetry: Sinus rhythm. Gastrointestinal system: Abdomen is nondistended, soft and nontender. Normal bowel sounds heard. Central nervous system: Alert and oriented. No focal neurological deficits. Extremities: Symmetric 5 x 5 power.   Data Reviewed: Basic Metabolic Panel:  Recent Labs Lab 04/27/13 2032 04/28/13 0612  NA 128* 131*  K 4.4 3.7  CL 90* 93*  CO2 23 23  GLUCOSE 163* 112*  BUN 23 26*  CREATININE 1.33 1.05  CALCIUM 8.6 8.6   Liver Function Tests:  Recent Labs Lab 04/27/13 2032  AST 20  ALT 15  ALKPHOS 47  BILITOT 0.7  PROT 8.5*  ALBUMIN 2.9*   No results found for this basename: LIPASE, AMYLASE,  in the last 168 hours No results found for this basename: AMMONIA,  in the last 168 hours CBC:  Recent Labs Lab 04/27/13 2032 04/28/13 0612  WBC 9.0 7.1  NEUTROABS 6.8  --  HGB 11.0* 11.0*  HCT 30.8* 31.2*  MCV 88.3 88.1  PLT 130* 125*   Cardiac Enzymes: No results found for this basename: CKTOTAL, CKMB, CKMBINDEX, TROPONINI,  in the last 168 hours BNP (last 3 results)  Recent Labs  03/11/13 0340  PROBNP 370.5*    CBG:  Recent Labs Lab 04/28/13 0747 04/28/13 1212 04/28/13 1254  GLUCAP 100* 59* 106*    No results found for this or any previous visit (from the past 240 hour(s)).    Additional labs: 1. Urine streptococcal and Legionella antigen: Negative 2. Influenza panel PCR: Positive for influenza B.     Studies: Dg Chest 2 View  04/27/2013   CLINICAL DATA:  Productive cough and fever  EXAM: CHEST  2 VIEW  COMPARISON:  March 25, 2013  FINDINGS: There is patchy infiltrate in the left base. Lungs are otherwise clear. Heart size and pulmonary vascularity are normal. No adenopathy. Patient is status post median sternotomy. Pacemaker lead tip is attached to the right ventricle.  IMPRESSION: Patchy infiltrate left base.   Electronically Signed   By: Lowella Grip M.D.   On: 04/27/2013 19:52        Scheduled Meds: . amLODipine  10 mg Oral Daily  . aspirin EC  81 mg Oral Daily  . ceFEPime (MAXIPIME) IV  1 g Intravenous 3 times per day  . enoxaparin (LOVENOX) injection  40 mg Subcutaneous Q24H  . glipiZIDE  5 mg Oral QAC breakfast  . guaiFENesin  600 mg Oral BID  . insulin aspart  0-5 Units Subcutaneous QHS  . insulin aspart  0-9 Units Subcutaneous TID WC  . isosorbide mononitrate  60 mg Oral Daily  . lisinopril  5 mg Oral Daily  . metoprolol tartrate  50 mg Oral BID  . oseltamivir  75 mg Oral BID  . [START ON 04/29/2013] pneumococcal 23 valent vaccine  0.5 mL Intramuscular Tomorrow-1000  . simvastatin  20 mg Oral Q2000  . [START ON 04/29/2013] vancomycin  1,000 mg Intravenous Q12H   Continuous Infusions:   Active Problems:   Seizure   HCAP (healthcare-associated pneumonia)   Fever   Acute respiratory failure   DM type 2 (diabetes mellitus, type 2)   Hypertension   Hyperlipemia   Pneumonia    Time spent: 31 minutes    Conway Fedora, MD, FACP, FHM. [facility] Pager [phone]  If 7PM-7AM, please contact night-coverage [email or web address] Password [email or web address] 04/28/2013,  5:01 PM    LOS: 1 day

## 2013-04-28 NOTE — ED Notes (Signed)
Carma Lair (daughter) [phone]  Call if needed.

## 2013-04-28 NOTE — Progress Notes (Signed)
UR completed.  Felicitas Sine, RN BSN MHA CCM Trauma/Neuro ICU Case Manager [phone]  

## 2013-04-28 NOTE — H&P (Signed)
Patient's PCP: Wendie Simmer, MD  Chief Complaint: Fever and cough  History of Present Illness: Eric [name] is a 58 y.o. 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  Since being discharged, patient reports that he has been doing well.  However, about 2 days ago he started having fevers, chills, cough which is productive.  Given his recent illness about a month ago, he presented to the emergency department for further evaluation.  He was found to be febrile with temperature 102.5 and chest x-ray showed patchy infiltrate in left base.  Hospitalist service was asked to admit the patient for further care and management.  Patient denies any chest pain.  Denies any abdominal pain.  Had episode of nausea and vomiting prior to arriving in the emergency department.  Denies any headaches or vision changes.  Of note, family members have been ill with an upper respiratory infection.  Review of Systems: All systems reviewed with the patient and positive as per history of present illness, otherwise all other systems are negative.  Past Medical History  Diagnosis Date  . Hypertension   . Hyperlipemia   . 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 Edema   . Cardiac arrest due to underlying cardiac condition    Past Surgical History  Procedure Laterality Date  . Coronary artery bypass graft    . Implantable cardioverter defibrillator implant  03/24/2013    STJ single chamber ICD implanted by Dr Caryl Comes for secondary prevention   Family History  Problem Relation Age of Onset  . Heart disease Mother   . Diabetes Mother    History   Social History  . Marital Status: Married    Spouse Name: N/A    Number of Children: N/A  . Years of Education: N/A   Occupational  History  . Not on file.   Social History Main Topics  . Smoking status: Never Smoker   . Smokeless tobacco: Not on file  . Alcohol Use: No  . Drug Use: Not on file  . Sexual Activity: Not on file   Other Topics Concern  . Not on file   Social History Narrative  . No narrative on file   Allergies: Review of patient's allergies indicates no known allergies.  Home Meds: Prior to Admission medications   Medication Sig Start Date End Date Taking? Authorizing Provider  amLODipine (NORVASC) 10 MG tablet Take 10 mg by mouth daily.   Yes Historical Provider, MD  aspirin EC 81 MG tablet Take 81 mg by mouth daily.   Yes Historical Provider, MD  glipiZIDE (GLUCOTROL) 5 MG tablet Take 5 mg by mouth daily before breakfast.   Yes Historical Provider, MD  isosorbide mononitrate (IMDUR) 60 MG 24 hr tablet Take 1 tablet (60 mg total) by mouth daily. 03/25/13  Yes Ripudeep Krystal Eaton, MD  lisinopril (PRINIVIL) 5 MG tablet Take 1 tablet (5 mg total) by mouth daily. 03/25/13  Yes Ripudeep Krystal Eaton, MD  metFORMIN (GLUMETZA) 500 MG (MOD) 24 hr tablet Take 500 mg by mouth daily with breakfast.   Yes Historical Provider, MD  metoprolol tartrate (LOPRESSOR) 25 MG tablet Take 50 mg by mouth 2 (two) times daily.   Yes Historical Provider, MD  simvastatin (ZOCOR) 20 MG tablet Take 20 mg by mouth daily.   Yes Historical  Provider, MD    Physical Exam: Blood pressure 131/68, pulse 91, temperature 100.4 F (38 C), temperature source Oral, resp. rate 14, height 5\' 6"  (1.676 m), weight 70.449 kg (155 lb 5 oz), SpO2 97.00%. General: Awake, Oriented x3, No acute distress. HEENT: EOMI, Moist mucous membranes Neck: Supple CV: S1 and S2 Lungs: Scattered crackles at left lung base, moderate air movement bilaterally. Abdomen: Soft, Nontender, Nondistended, +bowel sounds. Ext: Good pulses. Trace edema. No clubbing or cyanosis noted. Neuro: Cranial Nerves II-XII grossly intact. Has 5/5 motor strength in upper and lower  extremities. Back: Surgery scar noted in low back.  Lab results:  Recent Labs  04/27/13 2032  NA 128*  K 4.4  CL 90*  CO2 23  GLUCOSE 163*  BUN 23  CREATININE 1.33  CALCIUM 8.6    Recent Labs  04/27/13 2032  AST 20  ALT 15  ALKPHOS 47  BILITOT 0.7  PROT 8.5*  ALBUMIN 2.9*   No results found for this basename: LIPASE, AMYLASE,  in the last 72 hours  Recent Labs  04/27/13 2032  WBC 9.0  NEUTROABS 6.8  HGB 11.0*  HCT 30.8*  MCV 88.3  PLT 130*   No results found for this basename: CKTOTAL, CKMB, CKMBINDEX, TROPONINI,  in the last 72 hours No components found with this basename: POCBNP,  No results found for this basename: DDIMER,  in the last 72 hours No results found for this basename: HGBA1C,  in the last 72 hours No results found for this basename: CHOL, HDL, LDLCALC, TRIG, CHOLHDL, LDLDIRECT,  in the last 72 hours No results found for this basename: TSH, T4TOTAL, FREET3, T3FREE, THYROIDAB,  in the last 72 hours No results found for this basename: VITAMINB12, FOLATE, FERRITIN, TIBC, IRON, RETICCTPCT,  in the last 72 hours Imaging results:  Dg Chest 2 View  04/27/2013   CLINICAL DATA:  Productive cough and fever  EXAM: CHEST  2 VIEW  COMPARISON:  March 25, 2013  FINDINGS: There is patchy infiltrate in the left base. Lungs are otherwise clear. Heart size and pulmonary vascularity are normal. No adenopathy. Patient is status post median sternotomy. Pacemaker lead tip is attached to the right ventricle.  IMPRESSION: Patchy infiltrate left base.   Electronically Signed   By: Lowella Grip M.D.   On: 04/27/2013 19:52   Other results: EKG: Normal sinus rhythm HR 98.  Assessment & Plan by Problem: Acute respiratory failure with shortness of breath, cough and fever due to healthcare associated pneumonia Start patient on vancomycin and cefepime for healthcare associated pneumonia.  Check HIV, urine Legionella and strep pneumo.  Given other family members who have been  sick, suspect a component of viral infection, sent respiratory panel.  Type 2 diabetes Continue glipizide.  Sliding scale insulin.  Diabetic diet.  Hypertension Continue home antihypertensive medications.  Hyperlipidemia Continue statin.  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  Anemia of critical illness Improved.  Stable.  Mild thrombocytopenia Chronic.  Continue to monitor.  Prophylaxis Lovenox.  CODE STATUS Full code.  Disposition Admit the patient to medical bed as inpatient.  Time spent on admission, talking to the patient, and coordinating care was: 50 mins.  Besan Ketchem A, MD 04/28/2013, 1:50 AM

## 2013-04-28 NOTE — Evaluation (Signed)
Physical Therapy Evaluation Patient Details Name: Eric [name] MRN: [id number] DOB: [birth year] Today's Date: 04/28/2013   History of Present Illness  Eric [name] is a 58 y.o. 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  Since being discharged, patient reports that he has been doing well.  However, about 2 days ago he started having fevers, chills, cough which is productive.  Given his recent illness about a month ago, he presented to the emergency department for further evaluation.   Clinical Impression  Pt limited by impaired mobility, strength and activity tolerance.  Pt will benefit from skilled PT services to address deficits and increase functional independence.    Follow Up Recommendations Home health PT    Equipment Recommendations  Other (comment) (pt may need w/c for community)    Recommendations for Other Services OT consult     Precautions / Restrictions Restrictions Weight Bearing Restrictions: No      Mobility  Bed Mobility               General bed mobility comments: pt in chair upon PT arrival  Transfers Overall transfer level: Needs assistance Equipment used: Rolling walker (2 wheeled) Transfers: Sit to/from Stand Sit to Stand: Min guard         General transfer comment: cues for safe hand placement  Ambulation/Gait Ambulation/Gait assistance: Min assist Ambulation Distance (Feet): 40 Feet (x 2) Assistive device: Rolling walker (2 wheeled)     Gait velocity interpretation: <1.8 ft/sec, indicative of risk for recurrent falls General Gait Details: Pt very cautious and guarded during gait, slow cadence.  Pt appears dizzy but denies dizziness to PT, states "my legs are just tired".  Pt requires cues that he needs to sit down and rest to  prevent falling due to PT providing assistance to stand due to B LEs bending at knees due to weakness.  Pt requires 5 minute rest break between 40' x 2 gait training  Stairs            Wheelchair Mobility    Modified Rankin (Stroke Patients Only)       Balance                                             Pertinent Vitals/Pain No c/o pain.  Session performed on room air with spO2 97%-98%    Home Living Family/patient expects to be discharged to:: Private residence Living Arrangements: Children Available Help at Discharge: Family;Available 24 hours/day Type of Home: House Home Access: Level entry     Home Layout: One level Home Equipment: Walker - 2 wheels      Prior Function Level of Independence: Independent with assistive device(s)         Comments: pt states he always uses RW     Hand Dominance        Extremity/Trunk Assessment   Upper Extremity Assessment: Generalized weakness           Lower Extremity Assessment: Generalized weakness (neuropathy B LEs and UEs)      Cervical / Trunk Assessment: Normal  Communication   Communication: No difficulties  Cognition Arousal/Alertness: Awake/alert Behavior During Therapy: Flat affect Overall Cognitive Status: No family/caregiver present to determine baseline cognitive  functioning (delayed processing)                      General Comments      Exercises        Assessment/Plan    PT Assessment Patient needs continued PT services  PT Diagnosis Difficulty walking;Generalized weakness   PT Problem List Decreased strength;Decreased activity tolerance;Decreased mobility;Decreased safety awareness;Cardiopulmonary status limiting activity  PT Treatment Interventions DME instruction;Cognitive remediation;Gait training;Patient/family education;Stair training;Wheelchair mobility training;Functional mobility training;Modalities;Therapeutic activities;Therapeutic  exercise;Balance training;Neuromuscular re-education   PT Goals (Current goals can be found in the Care Plan section) Acute Rehab PT Goals Patient Stated Goal: none stated PT Goal Formulation: With patient Time For Goal Achievement: 05/07/13 Potential to Achieve Goals: Good    Frequency Min 3X/week   Barriers to discharge        Co-evaluation               End of Session Equipment Utilized During Treatment: Gait belt Activity Tolerance: Patient limited by fatigue Patient left: in chair;with call bell/phone within reach Nurse Communication: Mobility status         Time: 1305-1330 PT Time Calculation (min): 25 min   Charges:   PT Evaluation $Initial PT Evaluation Tier I: 1 Procedure PT Treatments $Gait Training: 8-22 mins   PT G Codes:          DONAWERTH,KAREN 04/28/2013, 1:33 PM

## 2013-04-29 ENCOUNTER — Encounter: Payer: Self-pay | Admitting: Internal Medicine

## 2013-04-29 DIAGNOSIS — J111 Influenza due to unidentified influenza virus with other respiratory manifestations: Secondary | ICD-10-CM | POA: Diagnosis present

## 2013-04-29 DIAGNOSIS — E11649 Type 2 diabetes mellitus with hypoglycemia without coma: Secondary | ICD-10-CM | POA: Diagnosis not present

## 2013-04-29 DIAGNOSIS — E1169 Type 2 diabetes mellitus with other specified complication: Secondary | ICD-10-CM

## 2013-04-29 LAB — BASIC METABOLIC PANEL
BUN: 28 mg/dL — ABNORMAL HIGH (ref 6–23)
CO2: 23 mEq/L (ref 19–32)
Calcium: 8.4 mg/dL (ref 8.4–10.5)
Chloride: 98 mEq/L (ref 96–112)
Creatinine, Ser: 0.89 mg/dL (ref 0.50–1.35)
GFR calc Af Amer: >90 mL/min (ref >90)
GLUCOSE: 114 mg/dL — AB (ref 70–99)
Potassium: 3.9 mEq/L (ref 3.7–5.3)
SODIUM: 134 meq/L — AB (ref 137–147)

## 2013-04-29 LAB — CBC
HEMATOCRIT: 27.3 % — AB (ref 39.0–52.0)
HEMOGLOBIN: 9.7 g/dL — AB (ref 13.0–17.0)
MCH: 31 pg (ref 26.0–34.0)
MCHC: 35.5 g/dL (ref 30.0–36.0)
MCV: 87.2 fL (ref 78.0–100.0)
Platelets: 121 10*3/uL — ABNORMAL LOW (ref 150–400)
RBC: 3.13 MIL/uL — AB (ref 4.22–5.81)
RDW: 12.5 % (ref 11.5–15.5)
WBC: 4.9 10*3/uL (ref 4.0–10.5)

## 2013-04-29 LAB — GLUCOSE, CAPILLARY
GLUCOSE-CAPILLARY: 79 mg/dL (ref 70–99)
Glucose-Capillary: 110 mg/dL — ABNORMAL HIGH (ref 70–99)
Glucose-Capillary: 103 mg/dL — ABNORMAL HIGH (ref 70–99)
Glucose-Capillary: 84 mg/dL (ref 70–99)
Glucose-Capillary: 189 mg/dL — ABNORMAL HIGH (ref 70–99)

## 2013-04-29 LAB — TROPONIN I
Troponin I: <0.3 ng/mL (ref <0.30)
Troponin I: <0.3 ng/mL (ref <0.30)

## 2013-04-29 MED ORDER — OXYCODONE-ACETAMINOPHEN 5-325 MG PO TABS
1.0000 | ORAL_TABLET | ORAL | Status: DC | PRN
  Administered 2013-04-30: 1 via ORAL
  Filled 2013-04-29: qty 1

## 2013-04-29 MED ORDER — NITROGLYCERIN 0.4 MG SL SUBL
SUBLINGUAL_TABLET | SUBLINGUAL | Status: AC
  Filled 2013-04-29: qty 1

## 2013-04-29 MED ORDER — OXYCODONE-ACETAMINOPHEN 5-325 MG PO TABS: 1.0000 | ORAL_TABLET | Freq: Once | ORAL | Status: DC

## 2013-04-29 MED ORDER — OXYCODONE-ACETAMINOPHEN 5-325 MG PO TABS
2.0000 | ORAL_TABLET | Freq: Once | ORAL | Status: AC
  Administered 2013-04-29: 2 via ORAL
  Filled 2013-04-29: qty 2

## 2013-04-29 MED ORDER — NITROGLYCERIN 0.4 MG SL SUBL
0.4000 mg | SUBLINGUAL_TABLET | SUBLINGUAL | Status: DC | PRN
  Administered 2013-04-29 (×5): 0.4 mg via SUBLINGUAL

## 2013-04-29 NOTE — Progress Notes (Signed)
2 doses of nitroglycerin SL given, VS remain satble. Patient verbalizes relief of chest pain.

## 2013-04-29 NOTE — ED Provider Notes (Signed)
Medical screening examination/treatment/procedure(s) were performed by non-physician practitioner and as supervising physician I was immediately available for consultation/collaboration.   EKG Interpretation   Date/Time:  Monday April 28 2013 01:42:22 EDT Ventricular Rate:  98 PR Interval:  128 QRS Duration: 82 QT Interval:  340 QTC Calculation: 434 R Axis:   -27 Text Interpretation:  Normal sinus rhythm Nonspecific ST abnormality No  significant change since last tracing Confirmed by Nicklous Aburto  MD, Renan Danese  [id number]) on 04/28/2013 1:46:20 AM       Teressa Lower, MD 04/29/13 [phone]

## 2013-04-29 NOTE — Progress Notes (Signed)
Addendum  Nursing paged to indicate that patient is now complaining of substernal, non pleuritic, non radiating CP.  H/O CAD and prior CABG Cath severe native disease and patent grafts Echo EF 55-60  Plan Obtain EKG Cycle Troponins NTG prn. On ASA Discussed with primary Cardiologist Dr. Terrence Dupont- Mx as above and will formally consult if persisting CP or if he rules in for ACS.  Vernell Leep, MD, FACP, FHM. [facility] Pager [phone]  If 7PM-7AM, please contact night-coverage [email or web address] Password [facility] 04/29/2013, 12:43 PM

## 2013-04-29 NOTE — Progress Notes (Signed)
Patient complaining of mid chest heaviness, VS stable, SR on monitor,MD made aware.

## 2013-04-29 NOTE — Progress Notes (Signed)
PROGRESS NOTE    Eric [name] [id number] DOB: [birth year] DOA: 04/27/2013 PCP: Wendie Simmer, MD  HPI/Brief narrative 58 y.o. 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 In the ED, patient had temperature of 102.5 and chest x-ray showed patchy infiltrate in the left base. Patient has other family members with upper respiratory tract illness. He was admitted for pneumonia. Influenza B +   Assessment/Plan:  1. Healthcare associated pneumonia/influenza: Continue droplet isolation. Continue IV vancomycin and cefepime. Started Tamiflu 75 mg by mouth twice a day. Urine Legionella and streptococcal antigen: Negative. Blood cultures x2:NTD. Will need followup chest x-ray to ensure resolution of pneumonia findings. Improving 2. Influenza with respiratory illness: Management as above. 3. Acute respiratory failure: Secondary to pneumonia and influenza. Oxygen, bronchodilator nebulizations. Check home O2 eval. 4. Type II DM with hypoglycemic episodes: DC oral hypoglycemics. Continue SSI. Follow closely and treat by hypoglycemia protocol. No further hypoglycemia since 4/6 afternoon. 5. Dehydration with hyponatremia: resolved after IVF- DC 6. Hypertension: Controlled. 7. Hyperlipidemia: Continue statins 8. Recent history of recurrent V. fib cardiac arrest, status post ICD: Stable. Monitor on telemetry. 9. Anemia and thrombocytopenia: Stable. Follow CBCs periodically. Thrombocytopenia may be related to influenza. 10. History of seizures: Not an antiepileptics. Monitor   Code Status: Full Family Communication: None at bedside Disposition Plan: Remains inpatient. Home when medically stable   Consultants:  None  Procedures:  None  Antibiotics:  IV cefepime 4/6  >  IV vancomycin 4/6 >  Oral Tamiflu 4/6   Subjective: Continues to feel better. Concerned re his low blood sugar- discussed in detail. No dyspnea. Mild mostly non productive cough. Last night with mild left sided CP with coughing- resolved.  Objective: Filed Vitals:   04/28/13 2237 04/29/13 0458 04/29/13 0930 04/29/13 1027  BP: 142/82 127/72 128/72 128/72  Pulse: 98 92 87   Temp: 98.1 F (36.7 C) 98.2 F (36.8 C) 98 F (36.7 C)   TempSrc: Oral Oral Oral   Resp: $Remo'18 18 18   'dsWBR$ Height:      Weight:      SpO2: 94% 97% 98%     Intake/Output Summary (Last 24 hours) at 04/29/13 1217 Last data filed at 04/29/13 0504  Gross per 24 hour  Intake    955 ml  Output      0 ml  Net    955 ml   Filed Weights   04/27/13 1908  Weight: 70.449 kg (155 lb 5 oz)     Exam:  General exam: Pleasant middle-aged male sitting up comfortably on chair. Respiratory system: Reduced breath sounds in the bases with occasional basal crackles-improving. Rest of lung fields clear to auscultation. No increased work of breathing. Cardiovascular system: S1 & S2 heard, RRR. No JVD, murmurs, gallops, clicks or pedal edema. Telemetry: Sinus rhythm. Gastrointestinal system: Abdomen is nondistended, soft and nontender. Normal bowel sounds heard. Central nervous system: Alert and oriented. No focal neurological deficits. Extremities: Symmetric 5 x 5 power.   Data Reviewed: Basic Metabolic Panel:  Recent Labs Lab 04/27/13 2032 04/28/13 0612 04/29/13 0606  NA 128* 131* 134*  K 4.4 3.7 3.9  CL 90* 93* 98  CO2 $Re'23 23 23  'RUD$ GLUCOSE 163* 112* 114*  BUN 23 26* 28*  CREATININE 1.33 1.05 0.89  CALCIUM 8.6 8.6 8.4   Liver Function Tests:  Recent Labs Lab 04/27/13 2032  AST 20  ALT 15  ALKPHOS 47  BILITOT 0.7  PROT 8.5*  ALBUMIN 2.9*   No results found for this basename: LIPASE, AMYLASE,  in the last 168 hours No results found for this basename: AMMONIA,  in the last 168 hours CBC:  Recent  Labs Lab 04/27/13 2032 04/28/13 0612 04/29/13 0606  WBC 9.0 7.1 4.9  NEUTROABS 6.8  --   --   HGB 11.0* 11.0* 9.7*  HCT 30.8* 31.2* 27.3*  MCV 88.3 88.1 87.2  PLT 130* 125* 121*   Cardiac Enzymes: No results found for this basename: CKTOTAL, CKMB, CKMBINDEX, TROPONINI,  in the last 168 hours BNP (last 3 results)  Recent Labs  03/11/13 0340  PROBNP 370.5*   CBG:  Recent Labs Lab 04/28/13 2236 04/29/13 0129 04/29/13 0404 04/29/13 0734 04/29/13 1148  GLUCAP 74 79 110* 103* 84    Recent Results (from the past 240 hour(s))  CULTURE, BLOOD (ROUTINE X 2)     Status: None   Collection Time    04/28/13  1:00 AM      Result Value Ref Range Status   Specimen Description BLOOD LEFT ARM   Final   Special Requests BOTTLES DRAWN AEROBIC AND ANAEROBIC 10CC EACH   Final   Culture  Setup Time     Final   Value: 04/28/2013 08:40     Performed at [facility]   Culture     Final   Value:        BLOOD CULTURE RECEIVED NO GROWTH TO DATE CULTURE WILL BE HELD FOR 5 DAYS BEFORE ISSUING A FINAL NEGATIVE REPORT     Performed at [facility]   Report Status PENDING   Incomplete  CULTURE, BLOOD (ROUTINE X 2)     Status: None   Collection Time    04/28/13  3:15 AM      Result Value Ref Range Status   Specimen Description BLOOD LEFT HAND   Final   Special Requests BOTTLES DRAWN AEROBIC AND ANAEROBIC 5CC EA   Final   Culture  Setup Time     Final   Value: 04/28/2013 08:40     Performed at [facility]   Culture     Final   Value:        BLOOD CULTURE RECEIVED NO GROWTH TO DATE CULTURE WILL BE HELD FOR 5 DAYS BEFORE ISSUING A FINAL NEGATIVE REPORT     Performed at [facility]   Report Status PENDING   Incomplete  RESPIRATORY VIRUS PANEL     Status: Abnormal   Collection Time    04/28/13  4:00 AM      Result Value Ref Range Status   Source - [facility]VPAN NASAL SWAB   Corrected   Comment: CORRECTED ON 04/06 AT 2058: PREVIOUSLY REPORTED AS NASAL SWAB    Respiratory Syncytial Virus A NOT DETECTED   Final   Respiratory Syncytial Virus B NOT DETECTED   Final   Influenza A NOT DETECTED   Final   Influenza B DETECTED (*)  Final   Parainfluenza 1 NOT DETECTED   Final   Parainfluenza 2 NOT DETECTED   Final   Parainfluenza 3 NOT DETECTED   Final   Metapneumovirus NOT DETECTED   Final   Rhinovirus NOT DETECTED   Final   Adenovirus NOT DETECTED   Final   Influenza A H1 NOT DETECTED   Final   Influenza A H3 NOT  DETECTED   Final   Comment: (NOTE)           Normal Reference Range for each Analyte: NOT DETECTED     Testing performed using the Luminex xTAG Respiratory Viral Panel test     kit.     This test was developed and its performance characteristics determined     by [facility]. It has not been cleared or approved by the [place]     Food and Drug Administration. This test is used for clinical purposes.     It should not be regarded as investigational or for research. This     laboratory is certified under the [place] (CLIA) as qualified to perform high complexity     clinical laboratory testing.     Performed at [facility]      Additional labs: 1. Urine streptococcal and Legionella antigen: Negative 2. Influenza panel PCR: Positive for influenza B.     Studies: Dg Chest 2 View  04/27/2013   CLINICAL DATA:  Productive cough and fever  EXAM: CHEST  2 VIEW  COMPARISON:  March 25, 2013  FINDINGS: There is patchy infiltrate in the left base. Lungs are otherwise clear. Heart size and pulmonary vascularity are normal. No adenopathy. Patient is status post median sternotomy. Pacemaker lead tip is attached to the right ventricle.  IMPRESSION: Patchy infiltrate left base.   Electronically Signed   By: Lowella Grip M.D.   On: 04/27/2013 19:52        Scheduled Meds: . amLODipine  10 mg Oral Daily  . aspirin EC  81 mg Oral Daily  . ceFEPime (MAXIPIME) IV  1 g Intravenous 3  times per day  . enoxaparin (LOVENOX) injection  40 mg Subcutaneous Q24H  . guaiFENesin  600 mg Oral BID  . insulin aspart  0-5 Units Subcutaneous QHS  . insulin aspart  0-9 Units Subcutaneous TID WC  . isosorbide mononitrate  60 mg Oral Daily  . lisinopril  5 mg Oral Daily  . metoprolol tartrate  50 mg Oral BID  . oseltamivir  75 mg Oral BID  . simvastatin  20 mg Oral Q2000  . vancomycin  1,000 mg Intravenous Q12H   Continuous Infusions:   Active Problems:   Seizure   HCAP (healthcare-associated pneumonia)   Fever   Acute respiratory failure   DM type 2 (diabetes mellitus, type 2)   Hypertension   Hyperlipemia   Pneumonia    Time spent: 25 minutes    Xadrian Craighead, MD, FACP, FHM. [facility] Pager [phone]  If 7PM-7AM, please contact night-coverage [email or web address] Password [email or web address] 04/29/2013, 12:17 PM    LOS: 2 days

## 2013-04-30 ENCOUNTER — Inpatient Hospital Stay (HOSPITAL_COMMUNITY): Payer: Medicaid Other

## 2013-04-30 DIAGNOSIS — R079 Chest pain, unspecified: Secondary | ICD-10-CM

## 2013-04-30 LAB — CBC
HEMATOCRIT: 25.5 % — AB (ref 39.0–52.0)
Hemoglobin: 9.2 g/dL — ABNORMAL LOW (ref 13.0–17.0)
MCH: 31.8 pg (ref 26.0–34.0)
MCHC: 36.1 g/dL — ABNORMAL HIGH (ref 30.0–36.0)
MCV: 88.2 fL (ref 78.0–100.0)
Platelets: 105 10*3/uL — ABNORMAL LOW (ref 150–400)
RBC: 2.89 MIL/uL — AB (ref 4.22–5.81)
RDW: 12.5 % (ref 11.5–15.5)
WBC: 3.8 10*3/uL — AB (ref 4.0–10.5)

## 2013-04-30 LAB — TROPONIN I

## 2013-04-30 LAB — GLUCOSE, CAPILLARY
GLUCOSE-CAPILLARY: 96 mg/dL (ref 70–99)
GLUCOSE-CAPILLARY: 127 mg/dL — AB (ref 70–99)
GLUCOSE-CAPILLARY: 100 mg/dL — AB (ref 70–99)
GLUCOSE-CAPILLARY: 143 mg/dL — AB (ref 70–99)
Glucose-Capillary: 91 mg/dL (ref 70–99)

## 2013-04-30 MED ORDER — ATORVASTATIN CALCIUM 80 MG PO TABS
80.0000 mg | ORAL_TABLET | Freq: Every day | ORAL | Status: DC
  Administered 2013-04-30 – 2013-05-01 (×2): 80 mg via ORAL
  Filled 2013-04-30 (×4): qty 1

## 2013-04-30 NOTE — Progress Notes (Signed)
PROGRESS NOTE    Eric [name] [id number] DOB: January 04, 1956 DOA: 04/27/2013 PCP: Wendie Simmer, MD  HPI/Brief narrative 58 y.o. 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 In the ED, patient had temperature of 102.5 and chest x-ray showed patchy infiltrate in the left base. Patient has other family members with upper respiratory tract illness. He was admitted for pneumonia. Influenza B +   Assessment/Plan:  1. Healthcare associated pneumonia/influenza: Continue droplet isolation. Continue IV vancomycin and cefepime. Started Tamiflu 75 mg by mouth twice a day. Urine Legionella and streptococcal antigen: Negative. Blood cultures x2:NTD. Will need followup chest x-ray to ensure resolution of pneumonia findings. Improving 2. Influenza with respiratory illness: Management as above. 3. Chest pain: Patient has been complaining of intermittent chest pain since 04/29/13. EKG without acute findings. Troponins negative. Sometimes relieved by sublingual NTG. Cardiology consulted and input appreciated-considering Plavix if hemoglobin stable. 4. Acute respiratory failure: Secondary to pneumonia and influenza. Oxygen, bronchodilator nebulizations. Check home O2 eval. 5. Type II DM with hypoglycemic episodes: DC oral hypoglycemics. Continue SSI. Follow closely and treat by hypoglycemia protocol. No further hypoglycemia since 4/6 afternoon. 6. Dehydration with hyponatremia: resolved after IVF- DC 7. Hypertension: Controlled. 8. Hyperlipidemia: Continue statins 9. Recent history of recurrent V. fib cardiac arrest, status post ICD: Stable. Monitor on telemetry. 10. Pancytopenia: Follow CBCs periodically. may be related to influenza. 11. History of seizures: Not an antiepileptics.  Monitor 12. Numbness: Patient has some vague symptoms of numbness of his head and all 4 extremities without associated weakness or tingling or any other symptoms. CT head without acute findings. Unclear etiology. 13. Moderate hydrocephalus: Seen on CT. Patient seems to have some cognitive impairment as well.   Code Status: Full Family Communication: Left message for patient's daughter on 4/8 Disposition Plan: Remains inpatient. Home when medically stable   Consultants:  None  Procedures:  None  Antibiotics:  IV cefepime 4/6 >  IV vancomycin 4/6 >  Oral Tamiflu 4/6   Subjective: Intermittent chest pain. Also complains of mild headache and vague numbness described of her face and all extremities without weakness, tingling, slurred speech or facial twisting.  Objective: Filed Vitals:   04/29/13 2141 04/29/13 2312 04/30/13 0536 04/30/13 1436  BP: 149/81 107/60 139/86 118/66  Pulse: 94 78 76 84  Temp: 98.1 F (36.7 C) 99 F (37.2 C) 98.2 F (36.8 C) 98 F (36.7 C)  TempSrc: Oral Oral Oral Oral  Resp: $Remo'17 18 18 18  'HrJav$ Height:      Weight:      SpO2: 95% 92% 96% 94%    Intake/Output Summary (Last 24 hours) at 04/30/13 1826 Last data filed at 04/30/13 1739  Gross per 24 hour  Intake   1450 ml  Output   1350 ml  Net    100 ml   Filed Weights   04/27/13 1908  Weight: 70.449 kg (155 lb 5 oz)     Exam:  General exam: Pleasant middle-aged male sitting up comfortably in bed. Respiratory system: Reduced breath sounds in the bases with occasional basal crackles-improving. Rest of lung fields clear to auscultation. No increased work of breathing. Cardiovascular system: S1 & S2 heard, RRR. No JVD, murmurs, gallops, clicks or pedal edema. Telemetry: Sinus rhythm. Gastrointestinal system: Abdomen is nondistended, soft and nontender. Normal bowel sounds heard. Central nervous system:  Alert and oriented. No focal neurological deficits. Extremities: Symmetric 5 x 5  power.   Data Reviewed: Basic Metabolic Panel:  Recent Labs Lab 04/27/13 2032 04/28/13 0612 04/29/13 0606  NA 128* 131* 134*  K 4.4 3.7 3.9  CL 90* 93* 98  CO2 $Re'23 23 23  'JuL$ GLUCOSE 163* 112* 114*  BUN 23 26* 28*  CREATININE 1.33 1.05 0.89  CALCIUM 8.6 8.6 8.4   Liver Function Tests:  Recent Labs Lab 04/27/13 2032  AST 20  ALT 15  ALKPHOS 47  BILITOT 0.7  PROT 8.5*  ALBUMIN 2.9*   No results found for this basename: LIPASE, AMYLASE,  in the last 168 hours No results found for this basename: AMMONIA,  in the last 168 hours CBC:  Recent Labs Lab 04/27/13 2032 04/28/13 0612 04/29/13 0606 04/30/13 0100  WBC 9.0 7.1 4.9 3.8*  NEUTROABS 6.8  --   --   --   HGB 11.0* 11.0* 9.7* 9.2*  HCT 30.8* 31.2* 27.3* 25.5*  MCV 88.3 88.1 87.2 88.2  PLT 130* 125* 121* 105*   Cardiac Enzymes:  Recent Labs Lab 04/29/13 1330 04/29/13 2040 04/30/13 0100  TROPONINI <0.30 <0.30 <0.30   BNP (last 3 results)  Recent Labs  03/11/13 0340  PROBNP 370.5*   CBG:  Recent Labs Lab 04/29/13 1708 04/29/13 2208 04/30/13 0738 04/30/13 1143 04/30/13 1659  GLUCAP 189* 96 91 127* 100*    Recent Results (from the past 240 hour(s))  CULTURE, BLOOD (ROUTINE X 2)     Status: None   Collection Time    04/28/13  1:00 AM      Result Value Ref Range Status   Specimen Description BLOOD LEFT ARM   Final   Special Requests BOTTLES DRAWN AEROBIC AND ANAEROBIC 10CC EACH   Final   Culture  Setup Time     Final   Value: 04/28/2013 08:40     Performed at [facility]   Culture     Final   Value:        BLOOD CULTURE RECEIVED NO GROWTH TO DATE CULTURE WILL BE HELD FOR 5 DAYS BEFORE ISSUING A FINAL NEGATIVE REPORT     Performed at [facility]   Report Status PENDING   Incomplete  CULTURE, BLOOD (ROUTINE X 2)     Status: None   Collection Time    04/28/13  3:15 AM      Result Value Ref Range Status   Specimen Description BLOOD LEFT HAND   Final   Special Requests  BOTTLES DRAWN AEROBIC AND ANAEROBIC 5CC EA   Final   Culture  Setup Time     Final   Value: 04/28/2013 08:40     Performed at [facility]   Culture     Final   Value:        BLOOD CULTURE RECEIVED NO GROWTH TO DATE CULTURE WILL BE HELD FOR 5 DAYS BEFORE ISSUING A FINAL NEGATIVE REPORT     Performed at [facility]   Report Status PENDING   Incomplete  RESPIRATORY VIRUS PANEL     Status: Abnormal   Collection Time    04/28/13  4:00 AM      Result Value Ref Range Status   Source - RVPAN NASAL SWAB   Corrected   Comment: CORRECTED ON 04/06 AT 2058: PREVIOUSLY REPORTED AS NASAL SWAB   Respiratory Syncytial Virus A NOT DETECTED   Final   Respiratory Syncytial Virus B NOT DETECTED  Final   Influenza A NOT DETECTED   Final   Influenza B DETECTED (*)  Final   Parainfluenza 1 NOT DETECTED   Final   Parainfluenza 2 NOT DETECTED   Final   Parainfluenza 3 NOT DETECTED   Final   Metapneumovirus NOT DETECTED   Final   Rhinovirus NOT DETECTED   Final   Adenovirus NOT DETECTED   Final   Influenza A H1 NOT DETECTED   Final   Influenza A H3 NOT DETECTED   Final   Comment: (NOTE)           Normal Reference Range for each Analyte: NOT DETECTED     Testing performed using the Luminex xTAG Respiratory Viral Panel test     kit.     This test was developed and its performance characteristics determined     by [facility]. It has not been cleared or approved by the [place]     Food and Drug Administration. This test is used for clinical purposes.     It should not be regarded as investigational or for research. This     laboratory is certified under the [employer] (CLIA) as qualified to perform high complexity     clinical laboratory testing.     Performed at [facility]      Additional labs: 1. Urine streptococcal and Legionella antigen: Negative 2. Influenza panel PCR: Positive for influenza B.     Studies: Ct Head  Wo Contrast  04/30/2013   CLINICAL DATA:  New onset of numbness in the face and extremities; fever  EXAM: CT HEAD WITHOUT CONTRAST  TECHNIQUE: Contiguous axial images were obtained from the base of the skull through the vertex without intravenous contrast.  COMPARISON:  MR HEAD WO/W CM dated 03/13/2013; CT HEAD W/O CM dated 03/07/2013  FINDINGS: Again demonstrated is moderate hydrocephalus involving the lateral and third ventricles with mild hydrocephalus of the fourth ventricle. There is no shift of the midline. There is no intracranial hemorrhage nor evidence of an evolving ischemic event. No areas of intracranial edema are demonstrated.  At bone window settings there is mucoperiosteal thickening within the right maxillary sinus as well as bilateral ethmoid sinus cells. The mastoid air cells are well pneumatized and clear. There is a burr hole in the right posterior parietal region which is stable.  IMPRESSION: 1. There is no evidence of an acute ischemic or hemorrhagic event. 2. There is stable moderate hydrocephalus as described. 3. There is new mucoperiosteal thickening within the right maxillary and bilateral ethmoid sinus cells which may reflect acute sinusitis.   Electronically Signed   By: David  [place]   On: 04/30/2013 10:10        Scheduled Meds: . amLODipine  10 mg Oral Daily  . aspirin EC  81 mg Oral Daily  . atorvastatin  80 mg Oral q1800  . ceFEPime (MAXIPIME) IV  1 g Intravenous 3 times per day  . enoxaparin (LOVENOX) injection  40 mg Subcutaneous Q24H  . guaiFENesin  600 mg Oral BID  . insulin aspart  0-5 Units Subcutaneous QHS  . insulin aspart  0-9 Units Subcutaneous TID WC  . isosorbide mononitrate  60 mg Oral Daily  . lisinopril  5 mg Oral Daily  . metoprolol tartrate  50 mg Oral BID  . oseltamivir  75 mg Oral BID  . vancomycin  1,000 mg Intravenous Q12H   Continuous Infusions:  Active Problems:   Seizure   HCAP (healthcare-associated pneumonia)   Fever   Acute  respiratory failure   DM type 2 (diabetes mellitus, type 2)   Hypertension   Hyperlipemia   Pneumonia   Influenza with respiratory manifestations   Diabetes mellitus with hypoglycemia    Time spent: 25 minutes    Modena Jansky, MD, FACP, [facility]. [facility] Pager [phone]  If 7PM-7AM, please contact night-coverage [email or web address] Password [email or web address] 04/30/2013, 6:26 PM    LOS: 3 days

## 2013-04-30 NOTE — Progress Notes (Signed)
Physical Therapy Treatment Patient Details Name: Eric [name] MRN: [id number] DOB: [birth year] Today's Date: 04/30/2013    History of Present Illness Jarien Friesz is a 58 y.o. 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  Since being discharged, patient reports that he has been doing well.  However, about 2 days ago he started having fevers, chills, cough which is productive.  Given his recent illness about a month ago, he presented to the emergency department for further evaluation.     PT Comments    Patient able to tolerate therapy today without rest. Patient is progressing towards goals but requires A for safety with mobility. Continue to recommend HHPT at this time. Continue working on endurance with gait training to improve patient functional independence. Post treatment nurse came in to adm medication and was notified of improvement with activity tolerance.     Follow Up Recommendations  Home health PT     Equipment Recommendations  Other (comment)    Recommendations for Other Services OT consult     Precautions / Restrictions Restrictions Weight Bearing Restrictions: No    Mobility  Bed Mobility Overal bed mobility: Needs Assistance Bed Mobility: Supine to Sit     Supine to sit: Min guard     General bed mobility comments: Patient required cues for hand placement to help support trunk in bed  Transfers Overall transfer level: Needs assistance Equipment used: Rolling walker (2 wheeled) Transfers: Sit to/from Stand Sit to Stand: Min guard         General transfer comment: Sit to stand x 5 for safety awareness and proper technique  Ambulation/Gait Ambulation/Gait assistance: Min assist Ambulation Distance (Feet): 200 Feet Assistive device: Rolling  walker (2 wheeled)       General Gait Details: Patient able to perform gait training with no rest breaks. Patient required cues for proper technique and posture   Stairs            Wheelchair Mobility    Modified Rankin (Stroke Patients Only)       Balance                                    Cognition Arousal/Alertness: Awake/alert Behavior During Therapy: WFL for tasks assessed/performed Overall Cognitive Status: No family/caregiver present to determine baseline cognitive functioning                      Exercises      General Comments        Pertinent Vitals/Pain Patient reports discomfort in bed. Post therapy patient was positioned in recliner.     Home Living                      Prior Function            PT Goals (current goals can now be found in the care plan section) Progress towards PT goals: Progressing toward goals    Frequency  Min 3X/week    PT Plan Current plan remains appropriate    Co-evaluation             End of Session Equipment Utilized During Treatment: Gait belt Activity Tolerance: Patient tolerated treatment well Patient left: in chair;with call bell/phone within  reach;with nursing/sitter in room     Time: [phone] PT Time Calculation (min): 30 min  Charges:                       G Codes:      [place], SPTA 04/30/2013, 1:42 PM

## 2013-04-30 NOTE — Progress Notes (Signed)
Patient awoke and began complaining of heaviness/pressure 9/10 in his chest at 2308.  Nitroglycerin administered every 65min x 3 doses.  After 3rd dose patient verbalized some relief with chest pain decreasing to 5/10.  Vital signs remained stable.  NSR on telemetry monitor. 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 K. Schorr notified of events and gave orders for Percocet for pain control.  Acknowledged that she was aware enzymes were being cycled from previous chest pain episode today.  Will notify her of any additional acute changes.  Patient now resting comfortably. Will continue to monitor closely.

## 2013-04-30 NOTE — Consult Note (Signed)
Reason for Consult:Recurrent chest pain Referring Physician: [facility] hospitalist  Eric [name] is an 58 y.o. male.  CNO:[id number] is 58 year old male with past medical history significant for coronary artery disease status post CABG in the past, history of V. Fib cardiac arrest in February 2015 subsequently had cardiac catheter noted to have distal diffuse disease not a good candidate for PCI noted to have patent LIMA to LAD and patent saphenous vein graft to obtuse marginal, and status post single-chamber ICD in March 2015, hypertension, diabetes mellitus, hypercholesterolemia, seizure disorder, chronic hydrocephalus, anemia, was admitted on 04/28/2013 because of cough fever or chills and was noted to have influenza complicated by left lower lobe pneumonia.  During her hospital stay patient had recurrent episodes of retrosternal chest pain described as heaviness localized associated with numbness in all extremities.  Patient did receive sublingual nitroglycerin with relief of chest heaviness EKG showed no acute ischemic changes 3 sets of cardiac enzymes have been negative patient presently denies any chest pain or heaviness.  States chest pain occasionally gets Worst with coughing.  Denies any nausea vomiting diaphoresis denies PND orthopnea or leg swelling.  Denies any palpitations or lightheadedness or syncope.  Past Medical History  Diagnosis Date  . Hypertension   . Hyperlipemia   . 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 Edema   . Cardiac arrest due to underlying cardiac condition     Past Surgical History  Procedure Laterality Date  . Coronary artery bypass graft    . Implantable cardioverter defibrillator implant  03/24/2013    STJ single chamber ICD implanted by Dr Caryl Comes for secondary prevention    Family History  Problem Relation Age of Onset  . Heart disease Mother   . Diabetes Mother     Social History:  reports that he has never smoked. He does not have any  smokeless tobacco history on file. He reports that he does not drink alcohol. His drug history is not on file.  Allergies: No Known Allergies  Medications: I have reviewed the patient's current medications.  Results for orders placed during the hospital encounter of 04/27/13 (from the past 48 hour(s))  GLUCOSE, CAPILLARY     Status: Abnormal   Collection Time    04/28/13 12:12 PM      Result Value Ref Range   Glucose-Capillary 59 (*) 70 - 99 mg/dL  INFLUENZA PANEL BY PCR (TYPE A & B, H1N1)     Status: Abnormal   Collection Time    04/28/13 12:22 PM      Result Value Ref Range   Influenza A By PCR NEGATIVE  NEGATIVE   Influenza B By PCR POSITIVE (*) NEGATIVE   H1N1 flu by pcr NOT DETECTED  NOT DETECTED   Comment:            The Xpert Flu assay (FDA approved for     nasal aspirates or washes and     nasopharyngeal swab specimens), is     intended as an aid in the diagnosis of     influenza and should not be used as     a sole basis for treatment.  GLUCOSE, CAPILLARY     Status: Abnormal   Collection Time    04/28/13 12:54 PM      Result Value Ref Range   Glucose-Capillary 106 (*) 70 - 99 mg/dL  GLUCOSE, CAPILLARY     Status: Abnormal   Collection Time    04/28/13  4:47 PM  Result Value Ref Range   Glucose-Capillary 57 (*) 70 - 99 mg/dL  GLUCOSE, CAPILLARY     Status: None   Collection Time    04/28/13  5:46 PM      Result Value Ref Range   Glucose-Capillary 77  70 - 99 mg/dL  GLUCOSE, CAPILLARY     Status: None   Collection Time    04/28/13 10:36 PM      Result Value Ref Range   Glucose-Capillary 74  70 - 99 mg/dL   Comment 1 Notify RN    GLUCOSE, CAPILLARY     Status: None   Collection Time    04/29/13  1:29 AM      Result Value Ref Range   Glucose-Capillary 79  70 - 99 mg/dL  GLUCOSE, CAPILLARY     Status: Abnormal   Collection Time    04/29/13  4:04 AM      Result Value Ref Range   Glucose-Capillary 110 (*) 70 - 99 mg/dL  CBC     Status: Abnormal    Collection Time    04/29/13  6:06 AM      Result Value Ref Range   WBC 4.9  4.0 - 10.5 K/uL   RBC 3.13 (*) 4.22 - 5.81 MIL/uL   Hemoglobin 9.7 (*) 13.0 - 17.0 g/dL   HCT 32.1 (*) 12.6 - 99.8 %   MCV 87.2  78.0 - 100.0 fL   MCH 31.0  26.0 - 34.0 pg   MCHC 35.5  30.0 - 36.0 g/dL   RDW 83.7  11.0 - 21.7 %   Platelets 121 (*) 150 - 400 K/uL  BASIC METABOLIC PANEL     Status: Abnormal   Collection Time    04/29/13  6:06 AM      Result Value Ref Range   Sodium 134 (*) 137 - 147 mEq/L   Potassium 3.9  3.7 - 5.3 mEq/L   Chloride 98  96 - 112 mEq/L   CO2 23  19 - 32 mEq/L   Glucose, Bld 114 (*) 70 - 99 mg/dL   BUN 28 (*) 6 - 23 mg/dL   Creatinine, Ser 5.81  0.50 - 1.35 mg/dL   Calcium 8.4  8.4 - 79.9 mg/dL   GFR calc non Af Amer >90  >90 mL/min   GFR calc Af Amer >90  >90 mL/min   Comment: (NOTE)     The eGFR has been calculated using the CKD EPI equation.     This calculation has not been validated in all clinical situations.     eGFR's persistently <90 mL/min signify possible Chronic Kidney     Disease.  GLUCOSE, CAPILLARY     Status: Abnormal   Collection Time    04/29/13  7:34 AM      Result Value Ref Range   Glucose-Capillary 103 (*) 70 - 99 mg/dL  GLUCOSE, CAPILLARY     Status: None   Collection Time    04/29/13 11:48 AM      Result Value Ref Range   Glucose-Capillary 84  70 - 99 mg/dL  TROPONIN I     Status: None   Collection Time    04/29/13  1:30 PM      Result Value Ref Range   Troponin I <0.30  <0.30 ng/mL   Comment:            Due to the release kinetics of cTnI,     a negative result within the first hours  of the onset of symptoms does not rule out     myocardial infarction with certainty.     If myocardial infarction is still suspected,     repeat the test at appropriate intervals.  GLUCOSE, CAPILLARY     Status: Abnormal   Collection Time    04/29/13  5:08 PM      Result Value Ref Range   Glucose-Capillary 189 (*) 70 - 99 mg/dL  TROPONIN I      Status: None   Collection Time    04/29/13  8:40 PM      Result Value Ref Range   Troponin I <0.30  <0.30 ng/mL   Comment:            Due to the release kinetics of cTnI,     a negative result within the first hours     of the onset of symptoms does not rule out     myocardial infarction with certainty.     If myocardial infarction is still suspected,     repeat the test at appropriate intervals.  GLUCOSE, CAPILLARY     Status: None   Collection Time    04/29/13 10:08 PM      Result Value Ref Range   Glucose-Capillary 96  70 - 99 mg/dL  TROPONIN I     Status: None   Collection Time    04/30/13  1:00 AM      Result Value Ref Range   Troponin I <0.30  <0.30 ng/mL   Comment:            Due to the release kinetics of cTnI,     a negative result within the first hours     of the onset of symptoms does not rule out     myocardial infarction with certainty.     If myocardial infarction is still suspected,     repeat the test at appropriate intervals.  CBC     Status: Abnormal   Collection Time    04/30/13  1:00 AM      Result Value Ref Range   WBC 3.8 (*) 4.0 - 10.5 K/uL   RBC 2.89 (*) 4.22 - 5.81 MIL/uL   Hemoglobin 9.2 (*) 13.0 - 17.0 g/dL   HCT 25.5 (*) 39.0 - 52.0 %   MCV 88.2  78.0 - 100.0 fL   MCH 31.8  26.0 - 34.0 pg   MCHC 36.1 (*) 30.0 - 36.0 g/dL   RDW 12.5  11.5 - 15.5 %   Platelets 105 (*) 150 - 400 K/uL   Comment: PLATELET COUNT CONFIRMED BY SMEAR     SPECIMEN CHECKED FOR CLOTS     REPEATED TO VERIFY  GLUCOSE, CAPILLARY     Status: None   Collection Time    04/30/13  7:38 AM      Result Value Ref Range   Glucose-Capillary 91  70 - 99 mg/dL    Ct Head Wo Contrast  04/30/2013   CLINICAL DATA:  New onset of numbness in the face and extremities; fever  EXAM: CT HEAD WITHOUT CONTRAST  TECHNIQUE: Contiguous axial images were obtained from the base of the skull through the vertex without intravenous contrast.  COMPARISON:  MR HEAD WO/W CM dated 03/13/2013; CT HEAD  W/O CM dated 03/07/2013  FINDINGS: Again demonstrated is moderate hydrocephalus involving the lateral and third ventricles with mild hydrocephalus of the fourth ventricle. There is no shift of the midline. There is no intracranial hemorrhage  nor evidence of an evolving ischemic event. No areas of intracranial edema are demonstrated.  At bone window settings there is mucoperiosteal thickening within the right maxillary sinus as well as bilateral ethmoid sinus cells. The mastoid air cells are well pneumatized and clear. There is a burr hole in the right posterior parietal region which is stable.  IMPRESSION: 1. There is no evidence of an acute ischemic or hemorrhagic event. 2. There is stable moderate hydrocephalus as described. 3. There is new mucoperiosteal thickening within the right maxillary and bilateral ethmoid sinus cells which may reflect acute sinusitis.   Electronically Signed   By: David  Martinique   On: 04/30/2013 10:10    Review of Systems  Constitutional: Positive for fever and chills.  Eyes: Negative for double vision and photophobia.  Respiratory: Positive for cough, sputum production and shortness of breath. Negative for hemoptysis.   Cardiovascular: Positive for chest pain. Negative for palpitations, orthopnea, claudication, leg swelling and PND.  Gastrointestinal: Negative for vomiting and diarrhea.  Neurological: Negative for dizziness and headaches.   Blood pressure 139/86, pulse 76, temperature 98.2 F (36.8 C), temperature source Oral, resp. rate 18, height $RemoveBe'5\' 6"'KPvOIIGrB$  (1.676 m), weight 70.449 kg (155 lb 5 oz), SpO2 96.00%. Physical Exam  Constitutional: He is oriented to person, place, and time.  HENT:  Head: Normocephalic and atraumatic.  Eyes: Conjunctivae are normal. Pupils are equal, round, and reactive to light. Left eye exhibits no discharge. Scleral icterus is present.  Neck: Normal range of motion. Neck supple. No JVD present. No tracheal deviation present. No thyromegaly  present.  Cardiovascular: Normal rate and regular rhythm.   Murmur (soft systolic murmur noted no S3 gallop) heard. Respiratory:  Decreased breath sounds at bases with occasional rhonchi  GI: Soft. Bowel sounds are normal. He exhibits no distension. There is no tenderness.  Musculoskeletal: He exhibits no edema and no tenderness.  Neurological: He is alert and oriented to person, place, and time.    Assessment/Plan: Atypical chest pain with some features worrisome for angina MI ruled out CAD status post CABG with patent grafts as above small vessel distal disease Status post V. Fib cardiac arrest status post single-chamber ICD Resolving influenza/left lower lobe pneumonia Hypertension Diabetes mellitus Hypercholesteremia Chronic hydrocephalus Seizure disorder Acute on chronic anemia etiology unclear Plan Agree with present management Check lipid panel Change simvastatin to atorvastatin 80 mg daily Reviewed the films from Done in February 2015 patient to have critical stenosis and very small PDA supplying a very small area of myocardium will treat medically. Workup for anemia per primary team Consider starting clopidogrel if hemoglobin remains stable and anemia workup is negative We will check EKG and troponin in a.m.  Allegra Lai Jefferey Lippmann 04/30/2013, 11:38 AM

## 2013-04-30 NOTE — Progress Notes (Signed)
Seen and agreed 04/30/2013 Julia Elizabeth Robinette PTA [phone] pager [phone] office    

## 2013-05-01 LAB — GLUCOSE, CAPILLARY
GLUCOSE-CAPILLARY: 104 mg/dL — AB (ref 70–99)
GLUCOSE-CAPILLARY: 218 mg/dL — AB (ref 70–99)
GLUCOSE-CAPILLARY: 117 mg/dL — AB (ref 70–99)
GLUCOSE-CAPILLARY: 117 mg/dL — AB (ref 70–99)

## 2013-05-01 LAB — BASIC METABOLIC PANEL
BUN: 18 mg/dL (ref 6–23)
CO2: 23 mEq/L (ref 19–32)
Calcium: 8.9 mg/dL (ref 8.4–10.5)
Chloride: 101 mEq/L (ref 96–112)
Creatinine, Ser: 0.74 mg/dL (ref 0.50–1.35)
GFR calc non Af Amer: >90 mL/min (ref >90)
GLUCOSE: 106 mg/dL — AB (ref 70–99)
POTASSIUM: 4.3 meq/L (ref 3.7–5.3)
Sodium: 137 mEq/L (ref 137–147)

## 2013-05-01 LAB — CBC
HCT: 29.3 % — ABNORMAL LOW (ref 39.0–52.0)
HEMOGLOBIN: 10.1 g/dL — AB (ref 13.0–17.0)
MCH: 30.8 pg (ref 26.0–34.0)
MCHC: 34.5 g/dL (ref 30.0–36.0)
MCV: 89.3 fL (ref 78.0–100.0)
Platelets: 163 10*3/uL (ref 150–400)
RBC: 3.28 MIL/uL — AB (ref 4.22–5.81)
RDW: 12.4 % (ref 11.5–15.5)
WBC: 6.1 10*3/uL (ref 4.0–10.5)

## 2013-05-01 LAB — HIV ANTIBODY (ROUTINE TESTING W REFLEX): HIV 1&2 Ab, 4th Generation: NONREACTIVE

## 2013-05-01 LAB — TROPONIN I: Troponin I: <0.3 ng/mL (ref <0.30)

## 2013-05-01 MED ORDER — LEVOFLOXACIN 750 MG PO TABS
750.0000 mg | ORAL_TABLET | Freq: Every day | ORAL | Status: DC
  Administered 2013-05-01 – 2013-05-02 (×2): 750 mg via ORAL
  Filled 2013-05-01 (×2): qty 1

## 2013-05-01 MED ORDER — CLOPIDOGREL BISULFATE 75 MG PO TABS
75.0000 mg | ORAL_TABLET | Freq: Every day | ORAL | Status: DC
  Administered 2013-05-01 – 2013-05-02 (×2): 75 mg via ORAL
  Filled 2013-05-01 (×3): qty 1

## 2013-05-01 NOTE — Progress Notes (Signed)
Subjective:  Complaints of recurrent chest heaviness states occasionally gets worse with breathing overall feels better states want to go home. Also complains of generalized numbness in upper and lower extremities. No weakness or slurred speech  Objective:  Vital Signs in the last 24 hours: Temp:  [98 F (36.7 C)-98.8 F (37.1 C)] 98.4 F (36.9 C) (04/09 0548) Pulse Rate:  [76-84] 76 (04/09 0548) Resp:  [14-18] 14 (04/09 0548) BP: (118-147)/(66-84) 147/84 mmHg (04/09 0548) SpO2:  [94 %-97 %] 97 % (04/09 0548)  Intake/Output from previous day: 04/08 0701 - 04/09 0700 In: 1390 [P.O.:840; IV Piggyback:550] Out: 1950 [Urine:1950] Intake/Output from this shift: Total I/O In: -  Out: 500 [Urine:500]  Physical Exam: Neck: no adenopathy, no carotid bruit, no JVD and supple, symmetrical, trachea midline Lungs: Decreased breath sound at bases with left basilar rhonchi Heart: regular rate and rhythm, S1, S2 normal and Soft systolic murmur noted no S3 gallop no pericardial rub Abdomen: soft, non-tender; bowel sounds normal; no masses,  no organomegaly Extremities: extremities normal, atraumatic, no cyanosis or edema  Lab Results:  Recent Labs  04/29/13 0606 04/30/13 0100  WBC 4.9 3.8*  HGB 9.7* 9.2*  PLT 121* 105*    Recent Labs  04/29/13 0606 05/01/13 0500  NA 134* 137  K 3.9 4.3  CL 98 101  CO2 23 23  GLUCOSE 114* 106*  BUN 28* 18  CREATININE 0.89 0.74    Recent Labs  04/30/13 0100 05/01/13 0532  TROPONINI <0.30 <0.30   Hepatic Function Panel No results found for this basename: PROT, ALBUMIN, AST, ALT, ALKPHOS, BILITOT, BILIDIR, IBILI,  in the last 72 hours No results found for this basename: CHOL,  in the last 72 hours No results found for this basename: PROTIME,  in the last 72 hours  Imaging: Imaging results have been reviewed and Ct Head Wo Contrast  04/30/2013   CLINICAL DATA:  New onset of numbness in the face and extremities; fever  EXAM: CT HEAD WITHOUT  CONTRAST  TECHNIQUE: Contiguous axial images were obtained from the base of the skull through the vertex without intravenous contrast.  COMPARISON:  MR HEAD WO/W CM dated 03/13/2013; CT HEAD W/O CM dated 03/07/2013  FINDINGS: Again demonstrated is moderate hydrocephalus involving the lateral and third ventricles with mild hydrocephalus of the fourth ventricle. There is no shift of the midline. There is no intracranial hemorrhage nor evidence of an evolving ischemic event. No areas of intracranial edema are demonstrated.  At bone window settings there is mucoperiosteal thickening within the right maxillary sinus as well as bilateral ethmoid sinus cells. The mastoid air cells are well pneumatized and clear. There is a burr hole in the right posterior parietal region which is stable.  IMPRESSION: 1. There is no evidence of an acute ischemic or hemorrhagic event. 2. There is stable moderate hydrocephalus as described. 3. There is new mucoperiosteal thickening within the right maxillary and bilateral ethmoid sinus cells which may reflect acute sinusitis.   Electronically Signed   By: David  Martinique   On: 04/30/2013 10:10    Cardiac Studies:  Assessment/Plan:  Atypical chest pain with some features worrisome for angina MI ruled out  CAD status post CABG with patent grafts as above small vessel distal disease  Status post V. Fib cardiac arrest status post single-chamber ICD  Resolving influenza/left lower lobe pneumonia  Hypertension  Diabetes mellitus  Hypercholesteremia  Chronic hydrocephalus  Seizure disorder  Acute on chronic anemia etiology unclear  Plan Continue  present management Okay to start Plavix 75 mg daily if hemoglobin is stable Check stools for occult blood  LOS: 4 days    Clent Demark 05/01/2013, 9:14 AM

## 2013-05-01 NOTE — Progress Notes (Signed)
Seen and agreed 05/01/2013 Julia Elizabeth Robinette PTA [phone] pager [phone] office    

## 2013-05-01 NOTE — Progress Notes (Signed)
Physical Therapy Treatment Patient Details Name: Eric [name] MRN: [id number] DOB: 1955-05-21 Today's Date: 05/01/2013    History of Present Illness Eric [name] is a 58 y.o. 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  Since being discharged, patient reports that he has been doing well.  However, about 2 days ago he started having fevers, chills, cough which is productive.  Given his recent illness about a month ago, he presented to the emergency department for further evaluation.     PT Comments    Patient is motivated to return home. Patient able to tolerate therapy today with increased gait training. Continue to emphasize safety with activities. Patient states daughter and wife can help once D/C. Continue to recommend HHPT to improve independence.    Follow Up Recommendations  Home health PT     Equipment Recommendations       Recommendations for Other Services       Precautions / Restrictions Restrictions Weight Bearing Restrictions: No    Mobility  Bed Mobility               General bed mobility comments: Patient was in recliner upon arrival  Transfers Overall transfer level: Needs assistance Equipment used: Rolling walker (2 wheeled) Transfers: Sit to/from Stand Sit to Stand: Min guard         General transfer comment: Sit to stand x 3 for safety awareness and proper technique with hand placement  Ambulation/Gait Ambulation/Gait assistance: Min assist Ambulation Distance (Feet): 300 Feet Assistive device: Rolling walker (2 wheeled) Gait Pattern/deviations: Step-through pattern;Decreased stance time - right;Drifts right/left     General Gait Details: Patient required 1 rest break. Patient required cue for proper technique.    Stairs             Wheelchair Mobility    Modified Rankin (Stroke Patients Only)       Balance                                    Cognition Arousal/Alertness: Awake/alert Behavior During Therapy: WFL for tasks assessed/performed Overall Cognitive Status: No family/caregiver present to determine baseline cognitive functioning                      Exercises General Exercises - Lower Extremity Hip Flexion/Marching: Seated;AROM;Both;10 reps    General Comments        Pertinent Vitals/Pain Patient denies pain.     Home Living                      Prior Function            PT Goals (current goals can now be found in the care plan section) Progress towards PT goals: Progressing toward goals    Frequency  Min 3X/week    PT Plan Current plan remains appropriate    Co-evaluation             End of Session Equipment Utilized During Treatment: Gait belt Activity Tolerance: Patient tolerated treatment well Patient left: in chair;with call bell/phone within reach     Time: 1412-1435 PT Time Calculation (min): 23 min  Charges:  G Codes:      Pima L Sarahbeth Cashin, SPTA 05/01/2013, 2:39 PM

## 2013-05-01 NOTE — Progress Notes (Signed)
PROGRESS NOTE    Eric [name] MAU:[id number] DOB: 1955-06-17 DOA: 04/27/2013 PCP: Wendie Simmer, MD  HPI/Brief narrative 58 y.o. 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 In the ED, patient had temperature of 102.5 and chest x-ray showed patchy infiltrate in the left base. Patient has other family members with upper respiratory tract illness. He was admitted for pneumonia. Influenza B +. Has been having intermittent chest pains-ruled out for ACS. Cardiology has consulted and recommended adding Plavix to aspirin and no further workup. Possibly DC in next 24 hours.   Assessment/Plan:  1. Healthcare associated pneumonia/influenza: Continue droplet isolation. Patient was empirically started on IV vancomycin and cefepime. Started Tamiflu 75 mg by mouth twice a day. Urine Legionella and streptococcal antigen: Negative. Blood cultures x2:NTD. Will need followup chest x-ray to ensure resolution of pneumonia findings. Improving-will transition antibiotics to by mouth levofloxacin and complete total 8 days treatment. 2. Influenza with respiratory illness: Management as above. Complete 5 days of Tamiflu. 3. Chest pain: Patient has been complaining of intermittent chest pain since 04/29/13. EKG without acute findings. Troponins negative. Sometimes relieved by sublingual NTG. Cardiology consulted followup appreciated-discussed with with Dr. Terrence Dupont who recommended adding Plavix to the aspirin, continue nitrates, statins, beta blockers. No further workup.  4. Acute respiratory failure: Secondary to pneumonia and influenza. Oxygen, bronchodilator nebulizations. Check home O2 eval- not done yet. 5. Type II DM with hypoglycemic episodes: DC'ed oral hypoglycemics. Continue SSI. Follow closely  and treat by hypoglycemia protocol. No further hypoglycemia since 4/6 afternoon. 6. Dehydration with hyponatremia: resolved after IVF- DC 7. Hypertension: Controlled. 8. Hyperlipidemia: Continue statins 9. Recent history of recurrent V. fib cardiac arrest, status post ICD/CAD status post CABG: Stable. As per cardiology, cath with patent grafts and small vessel disease. Status post V. fib cardiac arrest and single chamber ICD. 10. Pancytopenia: Follow CBCs periodically. may be related to influenza. Leukopenia and thrombocytopenia have resolved. Anemia is probably chronic and at baseline. Check FOBT. Consider outpatient evaluation of anemia as deemed necessary. 11. History of seizures: Not an antiepileptics. Monitor 12. Numbness: Patient has some vague symptoms of numbness of his head and all 4 extremities without associated weakness or tingling or any other symptoms. CT head without acute findings. Unclear etiology. 13. Moderate hydrocephalus: Seen on CT. Patient seems to have some cognitive impairment as well. The CT finding seems to be chronic which was also seen in CT of 03/07/13. May consider outpatient neurology consultation. 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 Home possibly 4/10   Consultants:  None  Procedures:  None  Antibiotics:  IV cefepime 4/6 >  IV vancomycin 4/6 >  Oral Tamiflu 4/6   Subjective: Continues to complain of intermittent chest pain and vague numbness described of her face and all extremities without weakness, tingling, slurred speech or facial twisting.  Objective: Filed Vitals:   04/30/13 0536 04/30/13 1436 04/30/13 2220 05/01/13 0548  BP: 139/86 118/66 134/78 147/84  Pulse: 76 84 79 76  Temp: 98.2 F (36.8 C) 98 F (36.7 C) 98.8 F (37.1 C) 98.4 F  (36.9 C)  TempSrc: Oral Oral Oral   Resp: 18 18 16  14  Height:      Weight:      SpO2: 96% 94% 96% 97%    Intake/Output Summary (Last 24 hours) at 05/01/13 1143 Last data filed at 05/01/13 0800  Gross per 24 hour  Intake   1270 ml  Output   2450 ml  Net  -1180 ml   Filed Weights   04/27/13 1908  Weight: 70.449 kg (155 lb 5 oz)     Exam:  General exam: Pleasant middle-aged male sitting up comfortably in bed. Respiratory system: Reduced breath sounds in the bases with occasional basal crackles-improving. Rest of lung fields clear to auscultation. No increased work of breathing. Cardiovascular system: S1 & S2 heard, RRR. No JVD, murmurs, gallops, clicks or pedal edema. Telemetry: Sinus rhythm. Gastrointestinal system: Abdomen is nondistended, soft and nontender. Normal bowel sounds heard. Central nervous system: Alert and oriented. No focal neurological deficits. Extremities: Symmetric 5 x 5 power.   Data Reviewed: Basic Metabolic Panel:  Recent Labs Lab 04/27/13 2032 04/28/13 0612 04/29/13 0606 05/01/13 0500  NA 128* 131* 134* 137  K 4.4 3.7 3.9 4.3  CL 90* 93* 98 101  CO2 _0 GLUCOSE 163* 112* 114* 106*  BUN 23 26* 28* 18  CREATININE 1.33 1.05 0.89 0.74  CALCIUM 8.6 8.6 8.4 8.9   Liver Function Tests:  Recent Labs Lab 04/27/13 2032  AST 20  ALT 15  ALKPHOS 47  BILITOT 0.7  PROT 8.5*  ALBUMIN 2.9*   No results found for this basename: LIPASE, AMYLASE,  in the last 168 hours No results found for this basename: AMMONIA,  in the last 168 hours CBC:  Recent Labs Lab 04/27/13 2032 04/28/13 0612 04/29/13 0606 04/30/13 0100 05/01/13 0500  WBC 9.0 7.1 4.9 3.8* 6.1  NEUTROABS 6.8  --   --   --   --   HGB 11.0* 11.0* 9.7* 9.2* 10.1*  HCT 30.8* 31.2* 27.3* 25.5* 29.3*  MCV 88.3 88.1 87.2 88.2 89.3  PLT 130* 125* 121* 105* 163   Cardiac Enzymes:  Recent Labs Lab 04/29/13 1330 04/29/13 2040 04/30/13 0100 05/01/13 0532  TROPONINI <0.30  <0.30 <0.30 <0.30   BNP (last 3 results)  Recent Labs  03/11/13 0340  PROBNP 370.5*   CBG:  Recent Labs Lab 04/30/13 0738 04/30/13 1143 04/30/13 1659 04/30/13 2207 05/01/13 0743  GLUCAP 91 127* 100* 143* 104*    Recent Results (from the past 240 hour(s))  CULTURE, BLOOD (ROUTINE X 2)     Status: None   Collection Time    04/28/13  1:00 AM      Result Value Ref Range Status   Specimen Description BLOOD LEFT ARM   Final   Special Requests BOTTLES DRAWN AEROBIC AND ANAEROBIC 10CC EACH   Final   Culture  Setup Time     Final   Value: 04/28/2013 08:40     Performed at [facility]   Culture     Final   Value:        BLOOD CULTURE RECEIVED NO GROWTH TO DATE CULTURE WILL BE HELD FOR 5 DAYS BEFORE ISSUING A FINAL NEGATIVE REPORT     Performed at [facility]   Report Status PENDING   Incomplete  CULTURE, BLOOD (ROUTINE X 2)     Status: None   Collection Time    04/28/13  3:15 AM      Result Value Ref Range Status   Specimen Description BLOOD LEFT HAND  Final   Special Requests BOTTLES DRAWN AEROBIC AND ANAEROBIC 5CC EA   Final   Culture  Setup Time     Final   Value: 04/28/2013 08:40     Performed at [facility]   Culture     Final   Value:        BLOOD CULTURE RECEIVED NO GROWTH TO DATE CULTURE WILL BE HELD FOR 5 DAYS BEFORE ISSUING A FINAL NEGATIVE REPORT     Performed at [facility]   Report Status PENDING   Incomplete  RESPIRATORY VIRUS PANEL     Status: Abnormal   Collection Time    04/28/13  4:00 AM      Result Value Ref Range Status   Source - [facility]AN NASAL SWAB   Corrected   Comment: CORRECTED ON 04/06 AT 2058: PREVIOUSLY REPORTED AS NASAL SWAB   Respiratory Syncytial Virus A NOT DETECTED   Final   Respiratory Syncytial Virus B NOT DETECTED   Final   Influenza A NOT DETECTED   Final   Influenza B DETECTED (*)  Final   Parainfluenza 1 NOT DETECTED   Final   Parainfluenza 2 NOT DETECTED   Final   Parainfluenza 3 NOT DETECTED    Final   Metapneumovirus NOT DETECTED   Final   Rhinovirus NOT DETECTED   Final   Adenovirus NOT DETECTED   Final   Influenza A H1 NOT DETECTED   Final   Influenza A H3 NOT DETECTED   Final   Comment: (NOTE)           Normal Reference Range for each Analyte: NOT DETECTED     Testing performed using the Luminex xTAG Respiratory Viral Panel test     kit.     This test was developed and its performance characteristics determined     by [facility]. It has not been cleared or approved by the [place]     Food and Drug Administration. This test is used for clinical purposes.     It should not be regarded as investigational or for research. This     laboratory is certified under the [place] (CLIA) as qualified to perform high complexity     clinical laboratory testing.     Performed at [facility]      Additional labs: 1. Urine streptococcal and Legionella antigen: Negative 2. Influenza panel PCR: Positive for influenza B.     Studies: Ct Head Wo Contrast  04/30/2013   CLINICAL DATA:  New onset of numbness in the face and extremities; fever  EXAM: CT HEAD WITHOUT CONTRAST  TECHNIQUE: Contiguous axial images were obtained from the base of the skull through the vertex without intravenous contrast.  COMPARISON:  MR HEAD WO/W CM dated 03/13/2013; CT HEAD W/O CM dated 03/07/2013  FINDINGS: Again demonstrated is moderate hydrocephalus involving the lateral and third ventricles with mild hydrocephalus of the fourth ventricle. There is no shift of the midline. There is no intracranial hemorrhage nor evidence of an evolving ischemic event. No areas of intracranial edema are demonstrated.  At bone window settings there is mucoperiosteal thickening within the right maxillary sinus as well as bilateral ethmoid sinus cells. The mastoid air cells are well pneumatized and clear. There is a burr hole in the right posterior parietal region which is  stable.  IMPRESSION: 1. There is no evidence of an acute ischemic or hemorrhagic event. 2. There is  stable moderate hydrocephalus as described. 3. There is new mucoperiosteal thickening within the right maxillary and bilateral ethmoid sinus cells which may reflect acute sinusitis.   Electronically Signed   By: David  Martinique   On: 04/30/2013 10:10        Scheduled Meds: . amLODipine  10 mg Oral Daily  . aspirin EC  81 mg Oral Daily  . atorvastatin  80 mg Oral q1800  . clopidogrel  75 mg Oral Q breakfast  . enoxaparin (LOVENOX) injection  40 mg Subcutaneous Q24H  . guaiFENesin  600 mg Oral BID  . insulin aspart  0-5 Units Subcutaneous QHS  . insulin aspart  0-9 Units Subcutaneous TID WC  . isosorbide mononitrate  60 mg Oral Daily  . lisinopril  5 mg Oral Daily  . metoprolol tartrate  50 mg Oral BID  . oseltamivir  75 mg Oral BID   Continuous Infusions:   Active Problems:   Seizure   HCAP (healthcare-associated pneumonia)   Fever   Acute respiratory failure   DM type 2 (diabetes mellitus, type 2)   Hypertension   Hyperlipemia   Pneumonia   Influenza with respiratory manifestations   Diabetes mellitus with hypoglycemia    Time spent: 25 minutes    Modena Jansky, MD, FACP, [facility]. [facility] Pager [phone]  If 7PM-7AM, please contact night-coverage [email or web address] Password [email or web address] 05/01/2013, 11:43 AM    LOS: 4 days

## 2013-05-01 NOTE — Progress Notes (Signed)
ANTIBIOTIC CONSULT NOTE - INITIAL  Pharmacy Consult for Levaquin Indication: PNA  No Known Allergies  Patient Measurements: Height: 5\' 6"  (167.6 cm) Weight: 155 lb 5 oz (70.449 kg) IBW/kg (Calculated) : 63.8  Vital Signs: Temp: 98.4 F (36.9 C) (04/09 0548) BP: 147/84 mmHg (04/09 0548) Pulse Rate: 76 (04/09 0548)  Labs:  Recent Labs  04/29/13 0606 04/30/13 0100 05/01/13 0500  WBC 4.9 3.8* 6.1  HGB 9.7* 9.2* 10.1*  PLT 121* 105* 163  CREATININE 0.89  --  0.74   Estimated Creatinine Clearance: 89.7 ml/min (by C-G formula based on Cr of 0.74).  Medical History: Past Medical History  Diagnosis Date  . Hypertension   . Hyperlipemia   . 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 Edema   . Cardiac arrest due to underlying cardiac condition    Assessment: 58 y/o M with recent admission for cardiac arrest.  Started on vanc and cefepime for PNA, now to change to po levaquin for total 8 days therapy.  Goal of Therapy:  Eradication of infection  Plan:  -Levaquin 750 mg po x 5 days  Breezie Micucci Poteet Jasa Dundon 05/01/2013,12:21 PM

## 2013-05-01 NOTE — Progress Notes (Signed)
Ambulating in halls with 02 saturation @ 94-96% on ra,, with HR in the low 90's.

## 2013-05-02 LAB — CBC
HCT: 28.6 % — ABNORMAL LOW (ref 39.0–52.0)
HEMOGLOBIN: 9.9 g/dL — AB (ref 13.0–17.0)
MCH: 30.7 pg (ref 26.0–34.0)
MCHC: 34.6 g/dL (ref 30.0–36.0)
MCV: 88.8 fL (ref 78.0–100.0)
Platelets: 194 10*3/uL (ref 150–400)
RBC: 3.22 MIL/uL — AB (ref 4.22–5.81)
RDW: 12.5 % (ref 11.5–15.5)
WBC: 6.3 10*3/uL (ref 4.0–10.5)

## 2013-05-02 LAB — GLUCOSE, CAPILLARY
GLUCOSE-CAPILLARY: 99 mg/dL (ref 70–99)
GLUCOSE-CAPILLARY: 131 mg/dL — AB (ref 70–99)

## 2013-05-02 MED ORDER — LEVOFLOXACIN 750 MG PO TABS: 750.0000 mg | ORAL_TABLET | Freq: Every day | ORAL | Status: AC

## 2013-05-02 MED ORDER — GUAIFENESIN ER 600 MG PO TB12: 600.0000 mg | ORAL_TABLET | Freq: Two times a day (BID) | ORAL | Status: DC

## 2013-05-02 MED ORDER — OSELTAMIVIR PHOSPHATE 75 MG PO CAPS: 75.0000 mg | ORAL_CAPSULE | Freq: Two times a day (BID) | ORAL | Status: DC

## 2013-05-02 MED ORDER — CLOPIDOGREL BISULFATE 75 MG PO TABS: 75.0000 mg | ORAL_TABLET | Freq: Every day | ORAL | Status: DC

## 2013-05-02 NOTE — Discharge Summary (Addendum)
Physician Discharge Summary  Eric [name] [id number] DOB: 11/06/1955 DOA: 04/27/2013  PCP: Wendie Simmer, MD  Admit date: 04/27/2013 Discharge date: 05/02/2013  Time spent: 40  minutes  Recommendations for Outpatient Follow-up:  1. D/c home with outpt PCP follow up in 1 week. completes 3 more days  oral Levaquin and 3 more doses of oral tamiflu. Patient started on plavix. 2. Please obtain follow up CXR in 4-6 weeks to valuate resolution of penumonia  Discharge Diagnoses:  Principal Problem:   HCAP (healthcare-associated pneumonia)  Active Problems:   Seizure   Acute respiratory failure with hypoxia   Fever   DM type 2 (diabetes mellitus, type 2), with hypoglycemia   Hypertension   Hyperlipemia   Pneumonia   Influenza with respiratory manifestations   Diabetes mellitus with hypoglycemia   Discharge Condition: fair  Diet recommendation: diabetic  Filed Weights   04/27/13 1908  Weight: 70.449 kg (155 lb 5 oz)    History of present illness:  Please refer to admission H&P for details, but in brief, 58 y.o. 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 In the ED, patient had temperature of 102.5 and chest x-ray showed patchy infiltrate in the left base. Patient has other family members with upper respiratory tract illness. He was admitted for pneumonia. Influenza B +. Has been having intermittent chest pains for which he was ruled out for ACS. Cardiology has consulted and recommended adding Plavix to aspirin and no further workup.   Hospital Course:  Acute hypoxic resp failure secondary to HCAP/ influenza  Patient stared on empiric vanco and cefepime. Started Tamiflu 75 mg by mouth twice a day. Urine Legionella and streptococcal antigen: Negative. Blood  cultures x2:NTD. Will need followup chest x-ray to ensure resolution of pneumonia findings in 4-6 weeks as outpt.. clinically improving and transitioned  antibiotics to by mouth levofloxacin and complete total 8 days treatment ( needs 3 more days as outpt).   Influenza with respiratory illness:   symptoms improved.  Afebrile. Complete 5 days of Tamiflu in 4/12.  Chest pain:  Patient has been complaining of intermittent chest pain since 04/29/13. EKG without acute findings. Troponins negative. Sometimes relieved by sublingual NTG. Cardiology consulted followup appreciated-discussed with with Dr. Terrence Dupont who recommended adding Plavix to the aspirin, continue nitrates, statins, beta blockers. No further workup. No chest pain this am.  Type II DM with hypoglycemic episodes:  DC'ed oral hypoglycemics. fsg now btween 120-220. Will resume metformin, hold glyburide upon discharge. A1C of 6.3.  Dehydration with hyponatremia:  resolved with IV fluids  Hypertension:  stable  Hyperlipidemia:  Continue statins  Recent history of recurrent V. fib cardiac arrest, status post ICD/CAD status post CABG:  Stable. As per cardiology, cath with patent grafts and small vessel disease. Status post V. fib cardiac arrest and single chamber ICD.  Pancytopenia: Leukopenia and thrombocytopenia possibly  related to influenza. resolved to normal now. Anemia is probably chronic and at baseline.  Consider outpatient evaluation of anemia as deemed necessary.  History of seizures: Not an antiepileptics.   Numbness: Patient has some vague symptoms of numbness of his head and all 4 extremities without associated weakness or tingling or any other symptoms. CT head without acute findings. Unclear etiology. Follow up as outpt  Moderate hydrocephalus: Seen on CT. Patient seems to have some cognitive impairment  as well. The CT finding seems to be chronic which was also seen in CT of 03/07/13. May consider outpatient neurology  consultation. 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  Code Status: Full  Family Communication: spoke with daughter over the phone   Antibiotics:  IV cefepime 4/6 >  IV vancomycin 4/6 >  Oral Tamiflu 4/6      Procedures: none Consultations:  cardiology (Dr Terrence Dupont)   Patient is clinically stable and can be discharged home with outpt PCP and cardiology follow up   Discharge Exam: Filed Vitals:   05/02/13 0530  BP: 127/75  Pulse: 74  Temp: 98.1 F (36.7 C)  Resp: 18    General: middle aged male in NAD HEENT: no pallor, moist oral mucosa  chest: clear b/l, no added sounds CVS: NS1&S2, no murmurs, rubs or gallop, has AICD  Abd: soft, NT, ND, BS+ Ext: warm, no edema CNS: AAOX3  Discharge Instructions You were cared for by a hospitalist during your hospital stay. If you have any questions about your discharge medications or the care you received while you were in the hospital after you are discharged, you can call the unit and asked to speak with the hospitalist on call if the hospitalist that took care of you is not available. Once you are discharged, your primary care physician will handle any further medical issues. Please note that NO REFILLS for any discharge medications will be authorized once you are discharged, as it is imperative that you return to your primary care physician (or establish a relationship with a primary care physician if you do not have one) for your aftercare needs so that they can reassess your need for medications and monitor your lab values.   Future Appointments Provider Department Dept Phone   06/24/2013 11:00 AM Deboraha Sprang, MD [place] [facility] [phone]       Medication List    STOP taking these medications       glipiZIDE 5 MG tablet  Commonly known as:  GLUCOTROL      TAKE these medications       amLODipine 10 MG tablet  Commonly known  as:  NORVASC  Take 10 mg by mouth daily.     aspirin EC 81 MG tablet  Take 81 mg by mouth daily.     guaiFENesin 600 MG 12 hr tablet  Commonly known as:  MUCINEX  Take 1 tablet (600 mg total) by mouth 2 (two) times daily.     isosorbide mononitrate 60 MG 24 hr tablet  Commonly known as:  IMDUR  Take 1 tablet (60 mg total) by mouth daily.     levofloxacin 750 MG tablet  Commonly known as:  LEVAQUIN  Take 1 tablet (750 mg total) by mouth daily. Until 4/14     lisinopril 5 MG tablet  Commonly known as:  PRINIVIL  Take 1 tablet (5 mg total) by mouth daily.     metFORMIN 500 MG (MOD) 24 hr tablet  Commonly known as:  GLUMETZA  Take 500 mg by mouth daily with breakfast.     metoprolol tartrate 25 MG tablet  Commonly known as:  LOPRESSOR  Take 50 mg by mouth 2 (two) times daily.     oseltamivir 75 MG capsule  Commonly known as:  TAMIFLU  Take 1 capsule (75 mg total) by mouth 2 (two) times daily. Until 4/12  simvastatin 20 MG tablet  Commonly known as:  ZOCOR  Take 20 mg by mouth daily.         Clopidogrel (plavix) 75 mg tablet Take 75 mg po daily     No Known Allergies     Follow-up Information   Follow up with Wendie Simmer, MD In 1 week.   Specialty:  Nurse Practitioner   Contact information:   [address] [place] [address] [phone]        The results of significant diagnostics from this hospitalization (including imaging, microbiology, ancillary and laboratory) are listed below for reference.    Significant Diagnostic Studies: Dg Chest 2 View  04/27/2013   CLINICAL DATA:  Productive cough and fever  EXAM: CHEST  2 VIEW  COMPARISON:  March 25, 2013  FINDINGS: There is patchy infiltrate in the left base. Lungs are otherwise clear. Heart size and pulmonary vascularity are normal. No adenopathy. Patient is status post median sternotomy. Pacemaker lead tip is attached to the right ventricle.  IMPRESSION: Patchy infiltrate left base.    Electronically Signed   By: Lowella Grip M.D.   On: 04/27/2013 19:52   Ct Head Wo Contrast  04/30/2013   CLINICAL DATA:  New onset of numbness in the face and extremities; fever  EXAM: CT HEAD WITHOUT CONTRAST  TECHNIQUE: Contiguous axial images were obtained from the base of the skull through the vertex without intravenous contrast.  COMPARISON:  MR HEAD WO/W CM dated 03/13/2013; CT HEAD W/O CM dated 03/07/2013  FINDINGS: Again demonstrated is moderate hydrocephalus involving the lateral and third ventricles with mild hydrocephalus of the fourth ventricle. There is no shift of the midline. There is no intracranial hemorrhage nor evidence of an evolving ischemic event. No areas of intracranial edema are demonstrated.  At bone window settings there is mucoperiosteal thickening within the right maxillary sinus as well as bilateral ethmoid sinus cells. The mastoid air cells are well pneumatized and clear. There is a burr hole in the right posterior parietal region which is stable.  IMPRESSION: 1. There is no evidence of an acute ischemic or hemorrhagic event. 2. There is stable moderate hydrocephalus as described. 3. There is new mucoperiosteal thickening within the right maxillary and bilateral ethmoid sinus cells which may reflect acute sinusitis.   Electronically Signed   By: David  Martinique   On: 04/30/2013 10:10    Microbiology: Recent Results (from the past 240 hour(s))  CULTURE, BLOOD (ROUTINE X 2)     Status: None   Collection Time    04/28/13  1:00 AM      Result Value Ref Range Status   Specimen Description BLOOD LEFT ARM   Final   Special Requests BOTTLES DRAWN AEROBIC AND ANAEROBIC 10CC EACH   Final   Culture  Setup Time     Final   Value: 04/28/2013 08:40     Performed at [facility]   Culture     Final   Value:        BLOOD CULTURE RECEIVED NO GROWTH TO DATE CULTURE WILL BE HELD FOR 5 DAYS BEFORE ISSUING A FINAL NEGATIVE REPORT     Performed at [facility]   Report  Status PENDING   Incomplete  CULTURE, BLOOD (ROUTINE X 2)     Status: None   Collection Time    04/28/13  3:15 AM      Result Value Ref Range Status   Specimen Description BLOOD LEFT HAND  Final   Special Requests BOTTLES DRAWN AEROBIC AND ANAEROBIC 5CC EA   Final   Culture  Setup Time     Final   Value: 04/28/2013 08:40     Performed at [facility]   Culture     Final   Value:        BLOOD CULTURE RECEIVED NO GROWTH TO DATE CULTURE WILL BE HELD FOR 5 DAYS BEFORE ISSUING A FINAL NEGATIVE REPORT     Performed at [facility]   Report Status PENDING   Incomplete  RESPIRATORY VIRUS PANEL     Status: Abnormal   Collection Time    04/28/13  4:00 AM      Result Value Ref Range Status   Source - [facility]VPAN NASAL SWAB   Corrected   Comment: CORRECTED ON 04/06 AT 2058: PREVIOUSLY REPORTED AS NASAL SWAB   Respiratory Syncytial Virus A NOT DETECTED   Final   Respiratory Syncytial Virus B NOT DETECTED   Final   Influenza A NOT DETECTED   Final   Influenza B DETECTED (*)  Final   Parainfluenza 1 NOT DETECTED   Final   Parainfluenza 2 NOT DETECTED   Final   Parainfluenza 3 NOT DETECTED   Final   Metapneumovirus NOT DETECTED   Final   Rhinovirus NOT DETECTED   Final   Adenovirus NOT DETECTED   Final   Influenza A H1 NOT DETECTED   Final   Influenza A H3 NOT DETECTED   Final   Comment: (NOTE)           Normal Reference Range for each Analyte: NOT DETECTED     Testing performed using the Luminex xTAG Respiratory Viral Panel test     kit.     This test was developed and its performance characteristics determined     by [facility]. It has not been cleared or approved by the [place]     Food and Drug Administration. This test is used for clinical purposes.     It should not be regarded as investigational or for research. This     laboratory is certified under the [place] (CLIA) as qualified to perform high complexity     clinical  laboratory testing.     Performed at [facility]: Basic Metabolic Panel:  Recent Labs Lab 04/27/13 2032 04/28/13 0612 04/29/13 0606 05/01/13 0500  NA 128* 131* 134* 137  K 4.4 3.7 3.9 4.3  CL 90* 93* 98 101  CO2 _0 GLUCOSE 163* 112* 114* 106*  BUN 23 26* 28* 18  CREATININE 1.33 1.05 0.89 0.74  CALCIUM 8.6 8.6 8.4 8.9   Liver Function Tests:  Recent Labs Lab 04/27/13 2032  AST 20  ALT 15  ALKPHOS 47  BILITOT 0.7  PROT 8.5*  ALBUMIN 2.9*   No results found for this basename: LIPASE, AMYLASE,  in the last 168 hours No results found for this basename: AMMONIA,  in the last 168 hours CBC:  Recent Labs Lab 04/27/13 2032 04/28/13 0612 04/29/13 0606 04/30/13 0100 05/01/13 0500 05/02/13 0500  WBC 9.0 7.1 4.9 3.8* 6.1 6.3  NEUTROABS 6.8  --   --   --   --   --   HGB 11.0* 11.0* 9.7* 9.2* 10.1* 9.9*  HCT 30.8* 31.2* 27.3* 25.5* 29.3* 28.6*  MCV 88.3 88.1 87.2 88.2 89.3 88.8  PLT 130*  125* 121* 105* 163 194   Cardiac Enzymes:  Recent Labs Lab 04/29/13 1330 04/29/13 2040 04/30/13 0100 05/01/13 0532  TROPONINI <0.30 <0.30 <0.30 <0.30   BNP: BNP (last 3 results)  Recent Labs  03/11/13 0340  PROBNP 370.5*   CBG:  Recent Labs Lab 05/01/13 0743 05/01/13 1132 05/01/13 1722 05/01/13 2113 05/02/13 0726  GLUCAP 104* 218* 117* 117* 99       Signed:  Dairon Procter  [facility] 05/02/2013, 11:26 AM

## 2013-05-02 NOTE — Progress Notes (Signed)
Subjective:  Patient denies any anginal chest pain continues to have a localized chest pain without associated symptoms. States breathing is improved eager to go home  Objective:  Vital Signs in the last 24 hours: Temp:  [98.1 F (36.7 C)-98.9 F (37.2 C)] 98.1 F (36.7 C) (04/10 0530) Pulse Rate:  [74-88] 74 (04/10 0530) Resp:  [16-18] 18 (04/10 0530) BP: (108-151)/(68-93) 127/75 mmHg (04/10 0530) SpO2:  [99 %-100 %] 99 % (04/10 0530)  Intake/Output from previous day: 04/09 0701 - 04/10 0700 In: -  Out: 500 [Urine:500] Intake/Output from this shift: Total I/O In: 240 [P.O.:240] Out: -   Physical Exam: Neck: no adenopathy, no carotid bruit, no JVD and supple, symmetrical, trachea midline Lungs: Decreased breath sound at bases with occasional left rhonchi Heart: regular rate and rhythm, S1, S2 normal and Soft systolic murmur noted Abdomen: soft, non-tender; bowel sounds normal; no masses,  no organomegaly Extremities: extremities normal, atraumatic, no cyanosis or edema  Lab Results:  Recent Labs  05/01/13 0500 05/02/13 0500  WBC 6.1 6.3  HGB 10.1* 9.9*  PLT 163 194    Recent Labs  05/01/13 0500  NA 137  K 4.3  CL 101  CO2 23  GLUCOSE 106*  BUN 18  CREATININE 0.74    Recent Labs  04/30/13 0100 05/01/13 0532  TROPONINI <0.30 <0.30   Hepatic Function Panel No results found for this basename: PROT, ALBUMIN, AST, ALT, ALKPHOS, BILITOT, BILIDIR, IBILI,  in the last 72 hours No results found for this basename: CHOL,  in the last 72 hours No results found for this basename: PROTIME,  in the last 72 hours  Imaging: Imaging results have been reviewed and No results found.  Cardiac Studies:  Assessment/Plan:  Atypical chest pain with some features worrisome for angina MI ruled out  Stable angina CAD status post CABG with patent grafts as above small vessel distal disease  Status post V. Fib cardiac arrest status post single-chamber ICD  Resolving  influenza/left lower lobe pneumonia  Hypertension  Diabetes mellitus  Hypercholesteremia  Chronic hydrocephalus  Seizure disorder  Acute on chronic anemia etiology unclear  Plan Ok to discharge from cardiac point of view followup with me in 2 weeks Anemia workup as per PMD  LOS: 5 days    Clent Demark 05/02/2013, 12:11 PM

## 2013-05-02 NOTE — Discharge Planning (Signed)
Patient discharged home in stable condition. Verbalizes understanding of all discharge instructions, including home medications and follow up appointments. 

## 2013-05-02 NOTE — Discharge Instructions (Signed)
Pneumonia, Adult °Pneumonia is an infection of the lungs. It may be caused by a germ (virus or bacteria). Some types of pneumonia can spread easily from person to person. This can happen when you cough or sneeze. °HOME CARE °· Only take medicine as told by your doctor. °· Take your medicine (antibiotics) as told. Finish it even if you start to feel better. °· Do not smoke. °· You may use a vaporizer or humidifier in your room. This can help loosen thick spit (mucus). °· Sleep so you are almost sitting up (semi-upright). This helps reduce coughing. °· Rest. °A shot (vaccine) can help prevent pneumonia. Shots are often advised for: °· People over 65 years old. °· Patients on chemotherapy. °· People with long-term (chronic) lung problems. °· People with immune system problems. °GET HELP RIGHT AWAY IF:  °· You are getting worse. °· You cannot control your cough, and you are losing sleep. °· You cough up blood. °· Your pain gets worse, even with medicine. °· You have a fever. °· Any of your problems are getting worse, not better. °· You have shortness of breath or chest pain. °MAKE SURE YOU:  °· Understand these instructions. °· Will watch your condition. °· Will get help right away if you are not doing well or get worse. °Document Released: 06/28/2007 Document Revised: 04/03/2011 Document Reviewed: 04/01/2010 °Exit[employer]® Patient Information ©2014 [employer]. ° °

## 2013-05-04 LAB — CULTURE, BLOOD (ROUTINE X 2)
CULTURE: NO GROWTH
Culture: NO GROWTH

## 2013-06-24 ENCOUNTER — Encounter: Payer: Self-pay | Admitting: Internal Medicine

## 2013-06-26 ENCOUNTER — Telehealth: Payer: Self-pay | Admitting: Internal Medicine

## 2013-06-26 NOTE — Telephone Encounter (Signed)
Pt states his Merlin flashed.  We attempted to transmit. While transmitting, pt stated he does not have a landline or cell adapter. I will mail him an adapter. Pt will call me when he receives it.

## 2013-06-26 NOTE — Telephone Encounter (Signed)
New message      Talk to someone in the device clinic regarding his defibulator

## 2013-07-04 ENCOUNTER — Encounter: Payer: Self-pay | Admitting: Internal Medicine

## 2013-07-31 ENCOUNTER — Encounter: Payer: Self-pay | Admitting: Internal Medicine

## 2013-08-13 ENCOUNTER — Ambulatory Visit: Payer: Medicaid Other | Admitting: Neurology

## 2013-09-09 ENCOUNTER — Ambulatory Visit: Payer: Medicaid Other | Admitting: Internal Medicine

## 2013-09-11 ENCOUNTER — Ambulatory Visit: Payer: Medicaid Other | Admitting: Neurology

## 2013-09-18 ENCOUNTER — Encounter: Payer: Self-pay | Admitting: Internal Medicine

## 2013-11-05 ENCOUNTER — Ambulatory Visit: Payer: Medicaid Other | Admitting: Internal Medicine

## 2013-11-13 ENCOUNTER — Encounter: Payer: Self-pay | Admitting: Internal Medicine

## 2013-11-26 ENCOUNTER — Emergency Department (HOSPITAL_COMMUNITY): Payer: Medicaid Other

## 2013-11-26 ENCOUNTER — Encounter (HOSPITAL_COMMUNITY): Payer: Self-pay | Admitting: Nurse Practitioner

## 2013-11-26 ENCOUNTER — Observation Stay (HOSPITAL_COMMUNITY)
Admission: EM | Admit: 2013-11-26 | Discharge: 2013-11-28 | Disposition: A | Discharge status: Home / Self Care | Payer: Medicaid Other | Attending: Internal Medicine | Admitting: Internal Medicine

## 2013-11-26 DIAGNOSIS — Z7982 Long term (current) use of aspirin: Secondary | ICD-10-CM | POA: Insufficient documentation

## 2013-11-26 DIAGNOSIS — R51 Headache: Secondary | ICD-10-CM | POA: Diagnosis not present

## 2013-11-26 DIAGNOSIS — B1920 Unspecified viral hepatitis C without hepatic coma: Secondary | ICD-10-CM | POA: Insufficient documentation

## 2013-11-26 DIAGNOSIS — E669 Obesity, unspecified: Secondary | ICD-10-CM | POA: Insufficient documentation

## 2013-11-26 DIAGNOSIS — R079 Chest pain, unspecified: Secondary | ICD-10-CM | POA: Diagnosis present

## 2013-11-26 DIAGNOSIS — I255 Ischemic cardiomyopathy: Secondary | ICD-10-CM | POA: Diagnosis present

## 2013-11-26 DIAGNOSIS — I472 Ventricular tachycardia: Secondary | ICD-10-CM | POA: Insufficient documentation

## 2013-11-26 DIAGNOSIS — E785 Hyperlipidemia, unspecified: Secondary | ICD-10-CM | POA: Insufficient documentation

## 2013-11-26 DIAGNOSIS — E119 Type 2 diabetes mellitus without complications: Secondary | ICD-10-CM

## 2013-11-26 DIAGNOSIS — I2510 Atherosclerotic heart disease of native coronary artery without angina pectoris: Secondary | ICD-10-CM | POA: Insufficient documentation

## 2013-11-26 DIAGNOSIS — Z951 Presence of aortocoronary bypass graft: Secondary | ICD-10-CM | POA: Diagnosis not present

## 2013-11-26 DIAGNOSIS — E1165 Type 2 diabetes mellitus with hyperglycemia: Secondary | ICD-10-CM | POA: Diagnosis not present

## 2013-11-26 DIAGNOSIS — I10 Essential (primary) hypertension: Secondary | ICD-10-CM | POA: Insufficient documentation

## 2013-11-26 DIAGNOSIS — R600 Localized edema: Secondary | ICD-10-CM | POA: Insufficient documentation

## 2013-11-26 DIAGNOSIS — K7460 Unspecified cirrhosis of liver: Secondary | ICD-10-CM

## 2013-11-26 DIAGNOSIS — Z79899 Other long term (current) drug therapy: Secondary | ICD-10-CM | POA: Insufficient documentation

## 2013-11-26 DIAGNOSIS — Z7902 Long term (current) use of antithrombotics/antiplatelets: Secondary | ICD-10-CM | POA: Insufficient documentation

## 2013-11-26 DIAGNOSIS — R768 Other specified abnormal immunological findings in serum: Secondary | ICD-10-CM | POA: Diagnosis present

## 2013-11-26 DIAGNOSIS — K7469 Other cirrhosis of liver: Secondary | ICD-10-CM | POA: Insufficient documentation

## 2013-11-26 LAB — CBC
HCT: 29.6 % — ABNORMAL LOW (ref 39.0–52.0)
HEMATOCRIT: 30.2 % — AB (ref 39.0–52.0)
HEMOGLOBIN: 10.6 g/dL — AB (ref 13.0–17.0)
Hemoglobin: 10.4 g/dL — ABNORMAL LOW (ref 13.0–17.0)
MCH: 30.1 pg (ref 26.0–34.0)
MCH: 30 pg (ref 26.0–34.0)
MCHC: 35.1 g/dL (ref 30.0–36.0)
MCHC: 35.1 g/dL (ref 30.0–36.0)
MCV: 85.8 fL (ref 78.0–100.0)
MCV: 85.6 fL (ref 78.0–100.0)
PLATELETS: 135 10*3/uL — AB (ref 150–400)
Platelets: 148 10*3/uL — ABNORMAL LOW (ref 150–400)
RBC: 3.45 MIL/uL — AB (ref 4.22–5.81)
RBC: 3.53 MIL/uL — ABNORMAL LOW (ref 4.22–5.81)
RDW: 12.2 % (ref 11.5–15.5)
RDW: 12.3 % (ref 11.5–15.5)
WBC: 4.7 10*3/uL (ref 4.0–10.5)
WBC: 5.5 10*3/uL (ref 4.0–10.5)

## 2013-11-26 LAB — BASIC METABOLIC PANEL
Anion gap: 8 (ref 5–15)
BUN: 22 mg/dL (ref 6–23)
CALCIUM: 8.3 mg/dL — AB (ref 8.4–10.5)
CO2: 27 mEq/L (ref 19–32)
CREATININE: 1.33 mg/dL (ref 0.50–1.35)
Chloride: 96 mEq/L (ref 96–112)
GFR calc non Af Amer: 57 mL/min — ABNORMAL LOW (ref >90)
GFR, EST AFRICAN AMERICAN: 66 mL/min — AB (ref >90)
Glucose, Bld: 442 mg/dL — ABNORMAL HIGH (ref 70–99)
Potassium: 4.6 mEq/L (ref 3.7–5.3)
Sodium: 131 mEq/L — ABNORMAL LOW (ref 137–147)

## 2013-11-26 LAB — GLUCOSE, CAPILLARY
Glucose-Capillary: 241 mg/dL — ABNORMAL HIGH (ref 70–99)
Glucose-Capillary: 271 mg/dL — ABNORMAL HIGH (ref 70–99)

## 2013-11-26 LAB — TROPONIN I
Troponin I: <0.3 ng/mL (ref <0.30)
Troponin I: <0.3 ng/mL (ref <0.30)

## 2013-11-26 LAB — CBG MONITORING, ED
Glucose-Capillary: 397 mg/dL — ABNORMAL HIGH (ref 70–99)
Glucose-Capillary: 329 mg/dL — ABNORMAL HIGH (ref 70–99)

## 2013-11-26 LAB — CREATININE, SERUM
Creatinine, Ser: 1.57 mg/dL — ABNORMAL HIGH (ref 0.50–1.35)
GFR calc Af Amer: 54 mL/min — ABNORMAL LOW (ref >90)
GFR calc non Af Amer: 46 mL/min — ABNORMAL LOW (ref >90)

## 2013-11-26 LAB — I-STAT TROPONIN, ED: Troponin i, poc: 0.01 ng/mL (ref 0.00–0.08)

## 2013-11-26 LAB — PRO B NATRIURETIC PEPTIDE: PRO B NATRI PEPTIDE: 452.5 pg/mL — AB (ref 0–125)

## 2013-11-26 MED ORDER — METOPROLOL TARTRATE 25 MG PO TABS
25.0000 mg | ORAL_TABLET | Freq: Every morning | ORAL | Status: DC
  Administered 2013-11-27 – 2013-11-28 (×2): 25 mg via ORAL
  Filled 2013-11-26 (×2): qty 1

## 2013-11-26 MED ORDER — LISINOPRIL 5 MG PO TABS
5.0000 mg | ORAL_TABLET | Freq: Every day | ORAL | Status: DC
  Administered 2013-11-26: 5 mg via ORAL
  Filled 2013-11-26: qty 1

## 2013-11-26 MED ORDER — HYDRALAZINE HCL 25 MG PO TABS: 25.0000 mg | ORAL_TABLET | Freq: Four times a day (QID) | ORAL | Status: DC | PRN

## 2013-11-26 MED ORDER — INSULIN GLARGINE 100 UNIT/ML ~~LOC~~ SOLN
10.0000 [IU] | Freq: Every day | SUBCUTANEOUS | Status: DC
  Administered 2013-11-26 – 2013-11-27 (×2): 5 [IU] via SUBCUTANEOUS
  Filled 2013-11-26 (×3): qty 0.1

## 2013-11-26 MED ORDER — SODIUM CHLORIDE 0.9 % IV SOLN: 250.0000 mL | INTRAVENOUS | Status: DC | PRN

## 2013-11-26 MED ORDER — ASPIRIN EC 81 MG PO TBEC
81.0000 mg | DELAYED_RELEASE_TABLET | Freq: Every day | ORAL | Status: DC
Start: 2013-11-27 — End: 2013-11-28
  Administered 2013-11-27 – 2013-11-28 (×2): 81 mg via ORAL
  Filled 2013-11-26 (×2): qty 1

## 2013-11-26 MED ORDER — SIMVASTATIN 20 MG PO TABS
20.0000 mg | ORAL_TABLET | Freq: Every day | ORAL | Status: DC
  Administered 2013-11-26 – 2013-11-27 (×2): 20 mg via ORAL
  Filled 2013-11-26 (×2): qty 1

## 2013-11-26 MED ORDER — SODIUM CHLORIDE 0.9 % IJ SOLN
3.0000 mL | Freq: Two times a day (BID) | INTRAMUSCULAR | Status: DC
  Administered 2013-11-27 – 2013-11-28 (×2): 3 mL via INTRAVENOUS

## 2013-11-26 MED ORDER — INSULIN ASPART 100 UNIT/ML ~~LOC~~ SOLN
0.0000 [IU] | Freq: Three times a day (TID) | SUBCUTANEOUS | Status: DC
  Administered 2013-11-26 – 2013-11-27 (×2): 3 [IU] via SUBCUTANEOUS
  Administered 2013-11-27 (×2): 2 [IU] via SUBCUTANEOUS
  Administered 2013-11-28 (×2): 1 [IU] via SUBCUTANEOUS

## 2013-11-26 MED ORDER — ENOXAPARIN SODIUM 40 MG/0.4ML ~~LOC~~ SOLN
40.0000 mg | SUBCUTANEOUS | Status: DC
  Administered 2013-11-26: 40 mg via SUBCUTANEOUS
  Filled 2013-11-26: qty 0.4

## 2013-11-26 MED ORDER — MORPHINE SULFATE 2 MG/ML IJ SOLN: 2.0000 mg | INTRAMUSCULAR | Status: DC | PRN

## 2013-11-26 MED ORDER — CLOPIDOGREL BISULFATE 75 MG PO TABS
75.0000 mg | ORAL_TABLET | Freq: Every day | ORAL | Status: DC
  Administered 2013-11-27 – 2013-11-28 (×2): 75 mg via ORAL
  Filled 2013-11-26 (×2): qty 1

## 2013-11-26 MED ORDER — FUROSEMIDE 10 MG/ML IJ SOLN
20.0000 mg | Freq: Two times a day (BID) | INTRAMUSCULAR | Status: DC
Start: 2013-11-26 — End: 2013-11-27
  Administered 2013-11-26: 20 mg via INTRAVENOUS
  Filled 2013-11-26: qty 2

## 2013-11-26 MED ORDER — ONDANSETRON HCL 4 MG/2ML IJ SOLN: 4.0000 mg | Freq: Four times a day (QID) | INTRAMUSCULAR | Status: DC | PRN

## 2013-11-26 MED ORDER — SODIUM CHLORIDE 0.9 % IJ SOLN: 3.0000 mL | INTRAMUSCULAR | Status: DC | PRN

## 2013-11-26 MED ORDER — SODIUM CHLORIDE 0.9 % IV SOLN
INTRAVENOUS | Status: DC
  Administered 2013-11-26: 2.7 [IU]/h via INTRAVENOUS
  Filled 2013-11-26: qty 2.5

## 2013-11-26 MED ORDER — SODIUM CHLORIDE 0.9 % IV SOLN
20.0000 mL | INTRAVENOUS | Status: DC
  Administered 2013-11-26: 20 mL via INTRAVENOUS

## 2013-11-26 MED ORDER — ISOSORBIDE MONONITRATE ER 60 MG PO TB24
60.0000 mg | ORAL_TABLET | Freq: Every day | ORAL | Status: DC
  Administered 2013-11-27 – 2013-11-28 (×2): 60 mg via ORAL
  Filled 2013-11-26 (×2): qty 1

## 2013-11-26 MED ORDER — DEXTROSE-NACL 5-0.45 % IV SOLN: INTRAVENOUS | Status: DC

## 2013-11-26 MED ORDER — ONDANSETRON HCL 4 MG PO TABS: 4.0000 mg | ORAL_TABLET | Freq: Four times a day (QID) | ORAL | Status: DC | PRN

## 2013-11-26 MED ORDER — MORPHINE SULFATE 2 MG/ML IJ SOLN
2.0000 mg | Freq: Once | INTRAMUSCULAR | Status: AC
  Administered 2013-11-26: 2 mg via INTRAVENOUS
  Filled 2013-11-26: qty 1

## 2013-11-26 NOTE — H&P (Signed)
PCP:   [name], NP   Chief Complaint:  Chest pain  HPI: 58 year old male who   has a past medical history of Hypertension; Hyperlipemia; Diabetes mellitus without complication; Edema; Cardiac arrest due to underlying cardiac condition; Obesity; and Ventricular tachycardia. Today presents to the hospital with chief complaint of chest pain and hyperglycemia.patient was seen at the PCP office and was found to have high blood sugar. Patient was transferred to the ED for further management, and Route he also complained of chest pain which was left-sided without radiation. He denies shortness of breath. No nausea vomiting or diarrhea. Patient has a history of CAD status post CABG. He has a history of cardiac arrest requiring CPR and intubation in February 2015.he underwent ICD placement on 3-15 In the ED he was found to have blood glucose 442 and was started on IV insulin glucose stabilizer.  Allergies:  No Known Allergies    Past Medical History  Diagnosis Date  . Hypertension   . Hyperlipemia   . 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 Edema   . 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 Obesity   . Ventricular tachycardia     Past Surgical History  Procedure Laterality Date  . Coronary artery bypass graft    . Implantable cardioverter defibrillator implant  03/24/2013    STJ single chamber ICD implanted by Dr Caryl Comes for secondary prevention    Prior to Admission medications   Medication Sig Start Date End Date Taking? Authorizing Provider  amLODipine (NORVASC) 10 MG tablet Take 10 mg by mouth daily.   Yes Historical Provider, MD  aspirin EC 81 MG tablet Take 81 mg by mouth daily.   Yes Historical Provider, MD  clopidogrel (PLAVIX) 75 MG tablet Take 1 tablet (75 mg total) by mouth daily with breakfast. 05/02/13  Yes Nishant Dhungel, MD  isosorbide mononitrate (IMDUR) 60 MG 24 hr tablet Take 1 tablet (60 mg total) by mouth daily.  03/25/13  Yes Ripudeep Krystal Eaton, MD  lisinopril (PRINIVIL) 5 MG tablet Take 1 tablet (5 mg total) by mouth daily. 03/25/13  Yes Ripudeep Krystal Eaton, MD  metFORMIN (GLUMETZA) 500 MG (MOD) 24 hr tablet Take 500 mg by mouth daily with breakfast.   Yes Historical Provider, MD  metoprolol tartrate (LOPRESSOR) 25 MG tablet Take 25 mg by mouth every morning.    Yes Historical Provider, MD  simvastatin (ZOCOR) 20 MG tablet Take 20 mg by mouth daily.   Yes Historical Provider, MD  guaiFENesin (MUCINEX) 600 MG 12 hr tablet Take 1 tablet (600 mg total) by mouth 2 (two) times daily. Patient not taking: Reported on 11/26/2013 05/02/13   Nishant Dhungel, MD  oseltamivir (TAMIFLU) 75 MG capsule Take 1 capsule (75 mg total) by mouth 2 (two) times daily. Patient not taking: Reported on 11/26/2013 05/02/13   Louellen Molder, MD    Social History:  reports that he has never smoked. He does not have any smokeless tobacco history on file. He reports that he does not drink alcohol. His drug history is not on file.  Family History  Problem Relation Age of Onset  . Heart disease Mother   . Diabetes Mother      All the positives are listed in BOLD  Review of Systems:  HEENT: Headache, blurred vision, runny nose, sore throat Neck: Hypothyroidism, hyperthyroidism,,lymphadenopathy Chest : Shortness of breath, history of COPD, Asthma Heart : Chest pain, history of coronary arterey  disease GI:  Nausea, vomiting, diarrhea, constipation, GERD GU: Dysuria, urgency, frequency of urination, hematuria Neuro: Stroke, seizures, syncope Psych: Depression, anxiety, hallucinations   Physical Exam: Blood pressure 116/73, pulse 70, resp. rate 15, SpO2 98 %. Constitutional:   Patient is a well-developed and well-nourished male * in no acute distress and cooperative with exam. Head: Normocephalic and atraumatic Mouth: Mucus membranes moist Eyes: PERRL, EOMI, conjunctivae normal Neck: Supple, No Thyromegaly Cardiovascular: RRR, S1  normal, S2 normal Pulmonary/Chest: CTAB, no wheezes, rales, or rhonchi Abdominal: Soft. Non-tender, non-distended, bowel sounds are normal, no masses, organomegaly, or guarding present.  Neurological: A&O x3, Strenght is normal and symmetric bilaterally, cranial nerve II-XII are grossly intact, no focal motor deficit, sensory intact to light touch bilaterally.  Extremities : bilateral 2+ pitting edema of the lower extremities  Labs on Admission:  Basic Metabolic Panel:  Recent Labs Lab 11/26/13 1300  NA 131*  K 4.6  CL 96  CO2 27  GLUCOSE 442*  BUN 22  CREATININE 1.33  CALCIUM 8.3*   Liver Function Tests: No results for input(s): AST, ALT, ALKPHOS, BILITOT, PROT, ALBUMIN in the last 168 hours. No results for input(s): LIPASE, AMYLASE in the last 168 hours. No results for input(s): AMMONIA in the last 168 hours. CBC:  Recent Labs Lab 11/26/13 1300  WBC 4.7  HGB 10.4*  HCT 29.6*  MCV 85.8  PLT 135*   Cardiac Enzymes: No results for input(s): CKTOTAL, CKMB, CKMBINDEX, TROPONINI in the last 168 hours.  BNP (last 3 results)  Recent Labs  03/11/13 0340 11/26/13 1300  PROBNP 370.5* 452.5*   CBG:  Recent Labs Lab 11/26/13 1329 11/26/13 1517  GLUCAP 397* 329*    Radiological Exams on Admission: Dg Chest 2 View  11/26/2013   CLINICAL DATA:  58 year old male with elevated blood pressure and high blood sugar. Left-sided chest pain radiating into the left arm.  EXAM: CHEST  2 VIEW  COMPARISON:  Chest x-ray 04/27/2013.  FINDINGS: Lung volumes are normal. No consolidative airspace disease. No pleural effusions. No pneumothorax. No pulmonary nodule or mass noted. Pulmonary vasculature and the cardiomediastinal silhouette are within normal limits. Status post median sternotomy. Left-sided pacemaker/AICD with lead tip projecting over the expected location of the right ventricular apex.  IMPRESSION: 1. No radiographic evidence of acute cardiopulmonary disease.   Electronically  Signed   By: Vinnie Langton M.D.   On: 11/26/2013 14:10    EKG: Independently reviewed. Sinus rhythm   Assessment/Plan Active Problems:   DM type 2 (diabetes mellitus, type 2)   Hypertension   Hyperlipemia   Chest pain  Chest pain We'll admit the patient to observation in telemetry, and obtain serial cardiac enzymes. Continue the home medications including aspirin and Plavix.Morphine when necessary for pain. Continue Imdur 60 mg by mouth daily.  CAD, Status post CABG Continue aspirin, Plavix, Imdur, Zocor 20 g by mouth daily  Diabetes mellitus Will hold the oral hypoglycemic and start Lantus 10 units at bedtime along with sliding scale insulin with NovoLog.  Hypertension Continue metoprolol 25 milligrams by mouth twice a day. Will hold the amlodipine at this time due to the lower extremity swelling  Bilateral lower extremity edema Patient has 2+ pitting edema of the lower extremities, BNP is 452. We'll start Lasix 20 g IV every 12 hours. Check albumin level. Hold amlodipine which can cause lower extremities swelling.  DVT prophylaxis Lovenox  Code status:full code  Family discussion: Admission, patients condition and plan of care including tests being  ordered have been discussed with the patient and his wife at bedside* who indicate understanding and agree with the plan and Code Status.   Time Spent on Admission: 65 minutes  LAMA,GAGAN S [facility] Pager: [phone] 11/26/2013, 3:58 PM  If 7PM-7AM, please contact night-coverage  [email or web address]  Password [email or web address]

## 2013-11-26 NOTE — ED Notes (Signed)
Patient is unable to give a urine specimen at this time  

## 2013-11-26 NOTE — Consult Note (Signed)
Reason for Consult:recurrent chest pain Referring Physician:[facility]  Eric [name] is an 58 y.o. male.  [id number] is 58 year old male with past medical history significant for coronary artery disease status post CABG in 2009 in [place], status post V. Fib cardiac arrest status post ICD in March 2015 status post left cardiac cath noted to have small vessel distal disease with patent grafts, hypertension, diabetes mellitus, hypercholesteremia, history of seizure disorder, chronic hydrocephalus with dilated third and lateral ventricles in the past, chronic anemia, came to the ER via EMS from PCPs office complaining of vague left-sided chest pain described as tightness grade 8/10 and was noted to have elevated blood sugar and blood pressure. Patient denies any nausea vomiting diaphoresis. Denies PND orthopnea but complains of leg swelling. Denies any palpitation lightheadedness or syncope. Denies any ICD discharges. Patient denies any exertional chest pain. Denies using any sublingual nitroglycerin recently.patient denies any recent cardiac workup  Past Medical History  Diagnosis Date  . Hypertension   . Hyperlipemia   . 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 Edema   . 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 Obesity   . Ventricular tachycardia     Past Surgical History  Procedure Laterality Date  . Coronary artery bypass graft    . Implantable cardioverter defibrillator implant  03/24/2013    STJ single chamber ICD implanted by Dr Caryl Comes for secondary prevention    Family History  Problem Relation Age of Onset  . Heart disease Mother   . Diabetes Mother     Social History:  reports that he has never smoked. He does not have any smokeless tobacco history on file. He reports that he does not drink alcohol. His drug history is not on file.  Allergies: No Known Allergies  Medications: I have reviewed the patient's current  medications.  Results for orders placed or performed during the hospital encounter of 11/26/13 (from the past 48 hour(s))  Basic metabolic panel     Status: Abnormal   Collection Time: 11/26/13  1:00 PM  Result Value Ref Range   Sodium 131 (L) 137 - 147 mEq/L   Potassium 4.6 3.7 - 5.3 mEq/L   Chloride 96 96 - 112 mEq/L   CO2 27 19 - 32 mEq/L   Glucose, Bld 442 (H) 70 - 99 mg/dL   BUN 22 6 - 23 mg/dL   Creatinine, Ser 1.33 0.50 - 1.35 mg/dL   Calcium 8.3 (L) 8.4 - 10.5 mg/dL   GFR calc non Af Amer 57 (L) >90 mL/min   GFR calc Af Amer 66 (L) >90 mL/min    Comment: (NOTE) The eGFR has been calculated using the CKD EPI equation. This calculation has not been validated in all clinical situations. eGFR's persistently <90 mL/min signify possible Chronic Kidney Disease.    Anion gap 8 5 - 15  CBC     Status: Abnormal   Collection Time: 11/26/13  1:00 PM  Result Value Ref Range   WBC 4.7 4.0 - 10.5 K/uL   RBC 3.45 (L) 4.22 - 5.81 MIL/uL   Hemoglobin 10.4 (L) 13.0 - 17.0 g/dL   HCT 29.6 (L) 39.0 - 52.0 %   MCV 85.8 78.0 - 100.0 fL   MCH 30.1 26.0 - 34.0 pg   MCHC 35.1 30.0 - 36.0 g/dL   RDW 12.2 11.5 - 15.5 %   Platelets 135 (L) 150 - 400 K/uL  Pro b natriuretic peptide  Status: Abnormal   Collection Time: 11/26/13  1:00 PM  Result Value Ref Range   Pro B Natriuretic peptide (BNP) 452.5 (H) 0 - 125 pg/mL  I-stat troponin, ED (not at [facility])     Status: None   Collection Time: 11/26/13  1:29 PM  Result Value Ref Range   Troponin i, poc 0.01 0.00 - 0.08 ng/mL   Comment 3            Comment: Due to the release kinetics of cTnI, a negative result within the first hours of the onset of symptoms does not rule out myocardial infarction with certainty. If myocardial infarction is still suspected, repeat the test at appropriate intervals.   POC CBG, ED     Status: Abnormal   Collection Time: 11/26/13  1:29 PM  Result Value Ref Range   Glucose-Capillary 397 (H) 70 - 99 mg/dL  CBG  monitoring, ED     Status: Abnormal   Collection Time: 11/26/13  3:17 PM  Result Value Ref Range   Glucose-Capillary 329 (H) 70 - 99 mg/dL  Glucose, capillary     Status: Abnormal   Collection Time: 11/26/13  4:36 PM  Result Value Ref Range   Glucose-Capillary 241 (H) 70 - 99 mg/dL    Dg Chest 2 View  11/26/2013   CLINICAL DATA:  58 year old male with elevated blood pressure and high blood sugar. Left-sided chest pain radiating into the left arm.  EXAM: CHEST  2 VIEW  COMPARISON:  Chest x-ray 04/27/2013.  FINDINGS: Lung volumes are normal. No consolidative airspace disease. No pleural effusions. No pneumothorax. No pulmonary nodule or mass noted. Pulmonary vasculature and the cardiomediastinal silhouette are within normal limits. Status post median sternotomy. Left-sided pacemaker/AICD with lead tip projecting over the expected location of the right ventricular apex.  IMPRESSION: 1. No radiographic evidence of acute cardiopulmonary disease.   Electronically Signed   By: Vinnie Langton M.D.   On: 11/26/2013 14:10    Review of Systems  Constitutional: Negative for fever, chills and weight loss.  Eyes: Negative for double vision and photophobia.  Respiratory: Negative for cough, hemoptysis and sputum production.   Cardiovascular: Positive for chest pain and leg swelling. Negative for palpitations, orthopnea and claudication.  Gastrointestinal: Negative for nausea, vomiting and abdominal pain.  Genitourinary: Negative for dysuria.  Neurological: Positive for headaches. Negative for dizziness.   Blood pressure 116/73, pulse 70, resp. rate 15, SpO2 98 %. Physical Exam  Constitutional: He is oriented to person, place, and time.  HENT:  Head: Normocephalic and atraumatic.  Eyes: Conjunctivae are normal. Left eye exhibits no discharge.  Neck: Normal range of motion. Neck supple. No JVD present.  Cardiovascular: Normal rate and regular rhythm.   Murmur (soft systolic murmur and S4 gallop  noted) heard. Respiratory: Effort normal and breath sounds normal.  GI: Soft. Bowel sounds are normal. He exhibits no distension. There is no tenderness.  Musculoskeletal:  No clubbing cyanosis 1+ edema noted left more than right  Neurological: He is alert and oriented to person, place, and time.    Assessment/Plan: Recurrent chest pain rule out MI CAD status post CABG in the past History of V. Fib cardiac arrest status post ICD in the past Hypertension Uncontrolled diabetes mellitus Apical history Seizure disorder Chronic anemia History of chronic hydrocephalus with dilated third and lateral ventricles Plan Agree with present management Will schedule for nuclear stress test in a.m. Montford Barg N 11/26/2013, 5:26 PM

## 2013-11-26 NOTE — ED Provider Notes (Signed)
CSN: [id number]     Arrival date & time 11/26/13  1243 History   First MD Initiated Contact with Patient 11/26/13 1245     Chief Complaint  Patient presents with  . Chest Pain  . Hyperglycemia  . Headache   HPI  Patient is a 58 y.o. Male with a PMH of DM, HTN, HL, obesity, and a cardiac arrest in 02/2013, a bypass coronary artery graft, and an ICD implant who presents to the ED from his PCP secondary to hyperglycemia, HTN, and CP.  Patient states that he was going to see his PCP for a regular checkup and was found to have systolic blood pressure over 200 and a cbg of over 500.  Patient was told to come to the hospital and in route developed 10/10 severe left sided chest pain and pressure.  Patient was given 325 mg of aspirin and NTG and transported here.  Patient states that he has been taking his medications as prescribed and has been feeling great up until today.  Patient does admit to an episode of PND last night and some pedal edema.  Patient is seen by Dr. Caryl Comes for cardiology and [facility].     Past Medical History  Diagnosis Date  . Hypertension   . Hyperlipemia   . 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 Edema   . 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 Obesity   . Ventricular tachycardia    Past Surgical History  Procedure Laterality Date  . Coronary artery bypass graft    . Implantable cardioverter defibrillator implant  03/24/2013    STJ single chamber ICD implanted by Dr Caryl Comes for secondary prevention   Family History  Problem Relation Age of Onset  . Heart disease Mother   . Diabetes Mother    History  Substance Use Topics  . Smoking status: Never Smoker   . Smokeless tobacco: Not on file  . Alcohol Use: No    Review of Systems  Constitutional: Negative for fever, chills and fatigue.  Respiratory: Positive for cough, chest tightness and shortness of breath. Negative for wheezing.   Cardiovascular: Positive for chest pain and  leg swelling. Negative for palpitations.  Gastrointestinal: Negative for nausea, vomiting, abdominal pain, diarrhea, constipation, blood in stool and anal bleeding.  Genitourinary: Negative for dysuria, urgency, frequency, hematuria and difficulty urinating.  All other systems reviewed and are negative.     Allergies  Review of patient's allergies indicates no known allergies.  Home Medications   Prior to Admission medications   Medication Sig Start Date End Date Taking? Authorizing Provider  amLODipine (NORVASC) 10 MG tablet Take 10 mg by mouth daily.   Yes Historical Provider, MD  aspirin EC 81 MG tablet Take 81 mg by mouth daily.   Yes Historical Provider, MD  clopidogrel (PLAVIX) 75 MG tablet Take 1 tablet (75 mg total) by mouth daily with breakfast. 05/02/13  Yes Nishant Dhungel, MD  isosorbide mononitrate (IMDUR) 60 MG 24 hr tablet Take 1 tablet (60 mg total) by mouth daily. 03/25/13  Yes Ripudeep Krystal Eaton, MD  lisinopril (PRINIVIL) 5 MG tablet Take 1 tablet (5 mg total) by mouth daily. 03/25/13  Yes Ripudeep Krystal Eaton, MD  metFORMIN (GLUMETZA) 500 MG (MOD) 24 hr tablet Take 500 mg by mouth daily with breakfast.   Yes Historical Provider, MD  metoprolol tartrate (LOPRESSOR) 25 MG tablet Take 25 mg by mouth every morning.    Yes Historical  Provider, MD  simvastatin (ZOCOR) 20 MG tablet Take 20 mg by mouth daily.   Yes Historical Provider, MD  guaiFENesin (MUCINEX) 600 MG 12 hr tablet Take 1 tablet (600 mg total) by mouth 2 (two) times daily. Patient not taking: Reported on 11/26/2013 05/02/13   Nishant Dhungel, MD  oseltamivir (TAMIFLU) 75 MG capsule Take 1 capsule (75 mg total) by mouth 2 (two) times daily. Patient not taking: Reported on 11/26/2013 05/02/13   Nishant Dhungel, MD   BP 116/73 mmHg  Pulse 70  Resp 15  SpO2 98% Physical Exam  Constitutional: He is oriented to person, place, and time. He appears well-developed and well-nourished. No distress.  HENT:  Head: Normocephalic and  atraumatic.  Mouth/Throat: No oropharyngeal exudate.  Eyes: Conjunctivae and EOM are normal. Pupils are equal, round, and reactive to light. No scleral icterus.  Neck: Normal range of motion. Neck supple. No JVD present. No thyromegaly present.  Cardiovascular: Normal rate, regular rhythm, normal heart sounds and intact distal pulses.  Exam reveals no gallop and no friction rub.   No murmur heard. Pulmonary/Chest: Effort normal and breath sounds normal. No respiratory distress. He has no wheezes. He has no rales. He exhibits tenderness.  Abdominal: Soft. Bowel sounds are normal. He exhibits no distension and no mass. There is no tenderness. There is no rebound and no guarding.  Musculoskeletal: Normal range of motion.  Lymphadenopathy:    He has no cervical adenopathy.  Neurological: He is alert and oriented to person, place, and time. He has normal strength. No cranial nerve deficit or sensory deficit. Coordination normal.  Skin: Skin is warm and dry. He is not diaphoretic.  Psychiatric: He has a normal mood and affect. His behavior is normal. Judgment and thought content normal.  Nursing note and vitals reviewed.   ED Course  Procedures (including critical care time) Labs Review Labs Reviewed  BASIC METABOLIC PANEL - Abnormal; Notable for the following:    Sodium 131 (*)    Glucose, Bld 442 (*)    Calcium 8.3 (*)    GFR calc non Af Amer 57 (*)    GFR calc Af Amer 66 (*)    All other components within normal limits  CBC - Abnormal; Notable for the following:    RBC 3.45 (*)    Hemoglobin 10.4 (*)    HCT 29.6 (*)    Platelets 135 (*)    All other components within normal limits  PRO B NATRIURETIC PEPTIDE - Abnormal; Notable for the following:    Pro B Natriuretic peptide (BNP) 452.5 (*)    All other components within normal limits  CBG MONITORING, ED - Abnormal; Notable for the following:    Glucose-Capillary 397 (*)    All other components within normal limits  CBG  MONITORING, ED - Abnormal; Notable for the following:    Glucose-Capillary 329 (*)    All other components within normal limits  URINALYSIS, ROUTINE W REFLEX MICROSCOPIC  CBC  CREATININE, SERUM  TROPONIN I  TROPONIN I  TROPONIN I  I-STAT TROPOININ, ED    Imaging Review Dg Chest 2 View  11/26/2013   CLINICAL DATA:  58 year old male with elevated blood pressure and high blood sugar. Left-sided chest pain radiating into the left arm.  EXAM: CHEST  2 VIEW  COMPARISON:  Chest x-ray 04/27/2013.  FINDINGS: Lung volumes are normal. No consolidative airspace disease. No pleural effusions. No pneumothorax. No pulmonary nodule or mass noted. Pulmonary vasculature and the cardiomediastinal silhouette are  within normal limits. Status post median sternotomy. Left-sided pacemaker/AICD with lead tip projecting over the expected location of the right ventricular apex.  IMPRESSION: 1. No radiographic evidence of acute cardiopulmonary disease.   Electronically Signed   By: Vinnie Langton M.D.   On: 11/26/2013 14:10     EKG Interpretation   Date/Time:  Wednesday November 26 2013 12:49:27 EST Ventricular Rate:  75 PR Interval:  143 QRS Duration: 84 QT Interval:  389 QTC Calculation: 434 R Axis:   -4 Text Interpretation:  Sinus rhythm Probable LVH with secondary repol abnrm  Confirmed by COOK  MD, BRIAN ([id number]) on 11/26/2013 1:31:18 PM      MDM   Final diagnoses:  Chest pain  Type 2 diabetes mellitus with hyperglycemia   Patient is a 58 y.o. Male with chest pain, hyperglycemia, and HTN.  Physical exam reveals some chest tenderness which patient reports is different form current severe chest tenderness.  CBC reveals mild anemia.  CMP reveals hyperglycemia with some hyponatremia.  UA is negative.  BNP is negative.  Troponin is negative.  Patient continued to have chest pain despite morphine, ASA, and nitroglycerin.  Patient started on glucose stabilizer.  Patient to be admitted to tele for  hyperglycemia and chest pain.  I have spoke with Dr. Terrence Dupont who will come to see the patient on the floor.  Patient seen by and discussed with Dr. Lacinda Axon who agrees with the above plan and workup.      Cherylann Parr, PA-C 11/26/13 [place], MD 12/02/13 [phone]

## 2013-11-26 NOTE — ED Notes (Signed)
Admitting at bedside 

## 2013-11-26 NOTE — ED Notes (Signed)
[facility] for increased leg swelling. Pt found to be hyptertensive at clinic initial BP 180/110. Pt given unknown med to decrease BP. CBG glucometer at center read HIGH. EMS got CBG of 500. Pt also endorses 8/10 non-rdiating left sided chest pain. Pt also endorses frontal headache 8/10 no neuro deficits noted.

## 2013-11-27 ENCOUNTER — Inpatient Hospital Stay (HOSPITAL_COMMUNITY): Payer: Medicaid Other

## 2013-11-27 DIAGNOSIS — R079 Chest pain, unspecified: Secondary | ICD-10-CM | POA: Diagnosis present

## 2013-11-27 DIAGNOSIS — E1165 Type 2 diabetes mellitus with hyperglycemia: Secondary | ICD-10-CM | POA: Diagnosis present

## 2013-11-27 LAB — GLUCOSE, CAPILLARY
GLUCOSE-CAPILLARY: 125 mg/dL — AB (ref 70–99)
GLUCOSE-CAPILLARY: 271 mg/dL — AB (ref 70–99)
Glucose-Capillary: 278 mg/dL — ABNORMAL HIGH (ref 70–99)
Glucose-Capillary: 190 mg/dL — ABNORMAL HIGH (ref 70–99)
Glucose-Capillary: 180 mg/dL — ABNORMAL HIGH (ref 70–99)
Glucose-Capillary: 211 mg/dL — ABNORMAL HIGH (ref 70–99)

## 2013-11-27 LAB — CBC
HCT: 28.2 % — ABNORMAL LOW (ref 39.0–52.0)
Hemoglobin: 9.7 g/dL — ABNORMAL LOW (ref 13.0–17.0)
MCH: 29.6 pg (ref 26.0–34.0)
MCHC: 34.4 g/dL (ref 30.0–36.0)
MCV: 86 fL (ref 78.0–100.0)
PLATELETS: 125 10*3/uL — AB (ref 150–400)
RBC: 3.28 MIL/uL — AB (ref 4.22–5.81)
RDW: 12.3 % (ref 11.5–15.5)
WBC: 4.7 10*3/uL (ref 4.0–10.5)

## 2013-11-27 LAB — URINALYSIS, ROUTINE W REFLEX MICROSCOPIC
BILIRUBIN URINE: NEGATIVE
Bilirubin Urine: NEGATIVE
Glucose, UA: NEGATIVE mg/dL
Glucose, UA: NEGATIVE mg/dL
KETONES UR: NEGATIVE mg/dL
KETONES UR: NEGATIVE mg/dL
Leukocytes, UA: NEGATIVE
Leukocytes, UA: NEGATIVE
NITRITE: NEGATIVE
Nitrite: NEGATIVE
PH: 6 (ref 5.0–8.0)
Protein, ur: >300 mg/dL — AB
Protein, ur: 100 mg/dL — AB
Specific Gravity, Urine: 1.011 (ref 1.005–1.030)
Specific Gravity, Urine: 1.009 (ref 1.005–1.030)
UROBILINOGEN UA: 1 mg/dL (ref 0.0–1.0)
Urobilinogen, UA: 1 mg/dL (ref 0.0–1.0)
pH: 6.5 (ref 5.0–8.0)

## 2013-11-27 LAB — COMPREHENSIVE METABOLIC PANEL
ALT: 58 U/L — AB (ref 0–53)
AST: 43 U/L — AB (ref 0–37)
Albumin: 1.7 g/dL — ABNORMAL LOW (ref 3.5–5.2)
Alkaline Phosphatase: 75 U/L (ref 39–117)
Anion gap: 9 (ref 5–15)
BUN: 26 mg/dL — ABNORMAL HIGH (ref 6–23)
CALCIUM: 8.1 mg/dL — AB (ref 8.4–10.5)
CHLORIDE: 101 meq/L (ref 96–112)
CO2: 26 meq/L (ref 19–32)
Creatinine, Ser: 1.67 mg/dL — ABNORMAL HIGH (ref 0.50–1.35)
GFR calc Af Amer: 50 mL/min — ABNORMAL LOW (ref >90)
GFR, EST NON AFRICAN AMERICAN: 43 mL/min — AB (ref >90)
Glucose, Bld: 281 mg/dL — ABNORMAL HIGH (ref 70–99)
POTASSIUM: 4.1 meq/L (ref 3.7–5.3)
SODIUM: 136 meq/L — AB (ref 137–147)
Total Bilirubin: 0.2 mg/dL — ABNORMAL LOW (ref 0.3–1.2)
Total Protein: 6.4 g/dL (ref 6.0–8.3)

## 2013-11-27 LAB — URINE MICROSCOPIC-ADD ON

## 2013-11-27 LAB — HEMOGLOBIN A1C
HEMOGLOBIN A1C: 8.2 % — AB (ref <5.7)
MEAN PLASMA GLUCOSE: 189 mg/dL — AB (ref <117)

## 2013-11-27 LAB — TROPONIN I

## 2013-11-27 MED ORDER — TECHNETIUM TC 99M SESTAMIBI GENERIC - CARDIOLITE
10.0000 | Freq: Once | INTRAVENOUS | Status: AC | PRN
  Administered 2013-11-27: 10 via INTRAVENOUS

## 2013-11-27 MED ORDER — FUROSEMIDE 10 MG/ML IJ SOLN
40.0000 mg | Freq: Two times a day (BID) | INTRAMUSCULAR | Status: DC
  Administered 2013-11-27 – 2013-11-28 (×2): 40 mg via INTRAVENOUS
  Filled 2013-11-27 (×2): qty 4

## 2013-11-27 MED ORDER — REGADENOSON 0.4 MG/5ML IV SOLN
INTRAVENOUS | Status: AC
  Administered 2013-11-27: 0.4 mg via INTRAVENOUS
  Filled 2013-11-27: qty 5

## 2013-11-27 MED ORDER — REGADENOSON 0.4 MG/5ML IV SOLN
0.4000 mg | Freq: Once | INTRAVENOUS | Status: AC
  Administered 2013-11-27: 0.4 mg via INTRAVENOUS
  Filled 2013-11-27: qty 5

## 2013-11-27 MED ORDER — ACETAMINOPHEN 325 MG PO TABS
650.0000 mg | ORAL_TABLET | Freq: Once | ORAL | Status: AC
  Administered 2013-11-27: 650 mg via ORAL

## 2013-11-27 MED ORDER — ACETAMINOPHEN 325 MG PO TABS
ORAL_TABLET | ORAL | Status: AC
  Administered 2013-11-27: 650 mg via ORAL
  Filled 2013-11-27: qty 2

## 2013-11-27 MED ORDER — HEPARIN SODIUM (PORCINE) 5000 UNIT/ML IJ SOLN
5000.0000 [IU] | Freq: Three times a day (TID) | INTRAMUSCULAR | Status: DC
  Administered 2013-11-27: 5000 [IU] via SUBCUTANEOUS
  Filled 2013-11-27: qty 1

## 2013-11-27 MED ORDER — TECHNETIUM TC 99M SESTAMIBI GENERIC - CARDIOLITE
30.0000 | Freq: Once | INTRAVENOUS | Status: AC | PRN
  Administered 2013-11-27: 30 via INTRAVENOUS

## 2013-11-27 NOTE — Plan of Care (Signed)
Problem: Phase II Progression Outcomes Goal: Stress Test if indicated Outcome: Completed/Met Date Met:  11/27/13 Stress test Completed

## 2013-11-27 NOTE — Progress Notes (Signed)
  Echocardiogram 2D Echocardiogram has been performed.  Eric [name] 11/27/2013, 3:59 PM

## 2013-11-27 NOTE — Plan of Care (Signed)
Problem: Phase I Progression Outcomes Goal: Anginal pain relieved Outcome: Completed/Met Date Met:  11/27/13 Goal: Voiding-avoid urinary catheter unless indicated Outcome: Completed/Met Date Met:  11/27/13  Problem: Phase II Progression Outcomes Goal: Hemodynamically stable Outcome: Completed/Met Date Met:  11/27/13 Goal: Anginal pain relieved Outcome: Completed/Met Date Met:  11/27/13 Goal: Stress Test if indicated Outcome: Progressing

## 2013-11-27 NOTE — Progress Notes (Signed)
Pt called me to room saying he is unable to move arms or legs. Pt is sitting on the edge of the bed. Upon assessment pt is able to push and pull feet against my hands and wiggle toes. [name] arms are flaccid upon assessment but when not assessing pt is hold arms out to the side against gravity. Pt says he is unable to squeeze my fingers. [name] speech is clear, face is symmetrical, PERRLA intact. RR and MD notified per protocol for stroke like symptoms even though symptoms upon assessment are inconsistent. Will continue to monitor pt.   Prescilla Sours, Therapist, sports

## 2013-11-27 NOTE — Progress Notes (Signed)
[facility] PROGRESS NOTE  Eric [name] [id number] DOB: 09/10/1955 DOA: 11/26/2013 PCP: [name], NP  Assessment/Plan: 1. CAD s/p CABG - for myoview today - continue ASA/plavix, metoprolol, imdur , hold statin pending liver eval  2. Edema/hypoalbuminemia -get ECHO, Liver US, check hepatitis panel -IV lasix 40mg  q12 -follow I/Os, weights  3. DM -continue lantus, SSI  4. CKD 3 -stable  5. DM neuropathy  DVT proph: lovenox  Code Status: Full Code Family Communication: wife at bedside Disposition Plan: Home pending workup   Consultants:  Dr.Harwani  Procedures:  Myoview: pending  HPI/Subjective: Having lower ext edema for 2 weeks, no further chest pain, intermittent DoE  Objective: Filed Vitals:   11/27/13 1300  BP: 150/90  Pulse: 67  Temp: 97.4 F (36.3 C)  Resp: 17    Intake/Output Summary (Last 24 hours) at 11/27/13 1633 Last data filed at 11/27/13 1615  Gross per 24 hour  Intake    480 ml  Output    751 ml  Net   -271 ml   Filed Weights   11/27/13 0408  Weight: 82.6 kg (182 lb 1.6 oz)    Exam:   General:  AAOx3, no distress  Cardiovascular: S1S2/RRR  Respiratory: CTAB  Abdomen: soft, NT, BS present, no organomegaly  Musculoskeletal: 2 plus edema    Data Reviewed: Basic Metabolic Panel:  Recent Labs Lab 11/26/13 1300 11/26/13 1650 11/27/13 0344  NA 131*  --  136*  K 4.6  --  4.1  CL 96  --  101  CO2 27  --  26  GLUCOSE 442*  --  281*  BUN 22  --  26*  CREATININE 1.33 1.57* 1.67*  CALCIUM 8.3*  --  8.1*   Liver Function Tests:  Recent Labs Lab 11/27/13 0344  AST 43*  ALT 58*  ALKPHOS 75  BILITOT 0.2*  PROT 6.4  ALBUMIN 1.7*   No results for input(s): LIPASE, AMYLASE in the last 168 hours. No results for input(s): AMMONIA in the last 168 hours. CBC:  Recent Labs Lab 11/26/13 1300 11/26/13 1650 11/27/13 0344  WBC 4.7 5.5 4.7  HGB 10.4* 10.6* 9.7*  HCT 29.6* 30.2* 28.2*  MCV 85.8  85.6 86.0  PLT 135* 148* 125*   Cardiac Enzymes:  Recent Labs Lab 11/26/13 1650 11/26/13 2154 11/27/13 0344  TROPONINI <0.30 <0.30 <0.30   BNP (last 3 results)  Recent Labs  03/11/13 0340 11/26/13 1300  PROBNP 370.5* 452.5*   CBG:  Recent Labs Lab 11/26/13 2042 11/27/13 0032 11/27/13 0406 11/27/13 0751 11/27/13 1144  GLUCAP 271* 271* 278* 190* 180*    No results found for this or any previous visit (from the past 240 hour(s)).   Studies: Dg Chest 2 View  11/26/2013   CLINICAL DATA:  58 year old male with elevated blood pressure and high blood sugar. Left-sided chest pain radiating into the left arm.  EXAM: CHEST  2 VIEW  COMPARISON:  Chest x-ray 04/27/2013.  FINDINGS: Lung volumes are normal. No consolidative airspace disease. No pleural effusions. No pneumothorax. No pulmonary nodule or mass noted. Pulmonary vasculature and the cardiomediastinal silhouette are within normal limits. Status post median sternotomy. Left-sided pacemaker/AICD with lead tip projecting over the expected location of the right ventricular apex.  IMPRESSION: 1. No radiographic evidence of acute cardiopulmonary disease.   Electronically Signed   By: Vinnie Langton M.D.   On: 11/26/2013 14:10   Nm Myocar Multi W/spect W/wall Motion / Ef  11/27/2013   CLINICAL DATA:  History of coronary artery disease, post CABG and AICD placement. History of hypertension, hyperlipidemia and insulin dependent diabetes.  EXAM: MYOCARDIAL IMAGING WITH SPECT (REST AND PHARMACOLOGIC-STRESS)  GATED LEFT VENTRICULAR WALL MOTION STUDY  LEFT VENTRICULAR EJECTION FRACTION  TECHNIQUE: Standard myocardial SPECT imaging was performed after resting intravenous injection of 10 mCi Tc-82m sestamibi. Subsequently, intravenous infusion of Lexiscan was performed under the supervision of the Cardiology staff. At peak effect of the drug, 30 mCi Tc-72m sestamibi was injected intravenously and standard myocardial SPECT imaging was performed.  Quantitative gated imaging was also performed to evaluate left ventricular wall motion, and estimate left ventricular ejection fraction.  COMPARISON:  Chest radiograph -11/26/2024  FINDINGS: Raw images: There is no significant patient motion artifact. Mild-to-moderate GI attenuation is seen, worse on the provided stress images.  Perfusion: There is attenuation involving the inferior and lateral walls of the left ventricle which improves on the provided stress images and is without associated regional wall motion abnormality to suggest prior large territory infarct. No scintigraphic evidence of pharmacologically induced ischemia.  Wall Motion: There is mild hypokinesia involving the basilar aspect of the inferior wall and septum, possibly the sequela of prior CABG. Otherwise, normal wall motion.  Left Ventricular Ejection Fraction: 49 %  End diastolic volume [id number] ml  End systolic volume 62 ml  IMPRESSION: 1. No scintigraphic evidence of prior infarction or pharmacologically induced ischemia.  2. Mild hypokinesia involving the basilar aspect of the inferior wall and septum, possibly the sequela of prior median sternotomy and CABG. Otherwise, normal wall motion  3.  Left ventricular ejection fraction 49%  4.  Low-risk stress test findings*.  *2012 Appropriate Use Criteria for Coronary Revascularization Focused Update: J Am Coll Cardiol. [id number]. http://content.airportbarriers.com.aspx?articleid=1201161   Electronically Signed   By: Sandi Mariscal M.D.   On: 11/27/2013 13:13    Scheduled Meds: . aspirin EC  81 mg Oral Daily  . clopidogrel  75 mg Oral Q breakfast  . furosemide  40 mg Intravenous BID  . heparin subcutaneous  5,000 Units Subcutaneous 3 times per day  . insulin aspart  0-9 Units Subcutaneous TID WC  . insulin glargine  10 Units Subcutaneous QHS  . isosorbide mononitrate  60 mg Oral Daily  . metoprolol tartrate  25 mg Oral q morning - 10a  . simvastatin  20 mg Oral Daily  . sodium  chloride  3 mL Intravenous Q12H   Continuous Infusions:  Antibiotics Given (last 72 hours)    None      Active Problems:   DM type 2 (diabetes mellitus, type 2)   Hypertension   Hyperlipemia   Chest pain    Time spent: 78min    [facility] Pager [phone]. If 7PM-7AM, please contact night-coverage at [email or web address], password [facility] 11/27/2013, 4:33 PM  LOS: 1 day

## 2013-11-27 NOTE — Progress Notes (Signed)
Subjective:  Patient denies any further chest pains or shortness of breath.   Objective:  Vital Signs in the last 24 hours: Temp:  [97.8 F (36.6 C)-97.9 F (36.6 C)] 97.8 F (36.6 C) (11/05 0408) Pulse Rate:  [68-77] 77 (11/05 0408) Resp:  [11-19] 15 (11/04 1545) BP: (107-181)/(66-112) 153/92 mmHg (11/05 1036) SpO2:  [98 %-100 %] 99 % (11/05 0408) Weight:  [82.6 kg (182 lb 1.6 oz)] 82.6 kg (182 lb 1.6 oz) (11/05 0408)  Intake/Output from previous day: 11/04 0701 - 11/05 0700 In: -  Out: 351 [Urine:350; Stool:1] Intake/Output from this shift:    Physical Exam: exam unchanged  Lab Results:  Recent Labs  11/26/13 1650 11/27/13 0344  WBC 5.5 4.7  HGB 10.6* 9.7*  PLT 148* 125*    Recent Labs  11/26/13 1300 11/26/13 1650 11/27/13 0344  NA 131*  --  136*  K 4.6  --  4.1  CL 96  --  101  CO2 27  --  26  GLUCOSE 442*  --  281*  BUN 22  --  26*  CREATININE 1.33 1.57* 1.67*    Recent Labs  11/26/13 2154 11/27/13 0344  TROPONINI <0.30 <0.30   Hepatic Function Panel  Recent Labs  11/27/13 0344  PROT 6.4  ALBUMIN 1.7*  AST 43*  ALT 58*  ALKPHOS 75  BILITOT 0.2*   No results for input(s): CHOL in the last 72 hours. No results for input(s): PROTIME in the last 72 hours.  Imaging: Imaging results have been reviewed and Dg Chest 2 View  11/26/2013   CLINICAL DATA:  59 year old male with elevated blood pressure and high blood sugar. Left-sided chest pain radiating into the left arm.  EXAM: CHEST  2 VIEW  COMPARISON:  Chest x-ray 04/27/2013.  FINDINGS: Lung volumes are normal. No consolidative airspace disease. No pleural effusions. No pneumothorax. No pulmonary nodule or mass noted. Pulmonary vasculature and the cardiomediastinal silhouette are within normal limits. Status post median sternotomy. Left-sided pacemaker/AICD with lead tip projecting over the expected location of the right ventricular apex.  IMPRESSION: 1. No radiographic evidence of acute  cardiopulmonary disease.   Electronically Signed   By: Vinnie Langton M.D.   On: 11/26/2013 14:10    Cardiac Studies:  Assessment/Plan:  Status postRecurrent chest pain MI ruled out by serial enzymes and EKG Distal small vessel disease CAD status post CABG in the past History of V. 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

## 2013-11-28 ENCOUNTER — Observation Stay (HOSPITAL_COMMUNITY): Payer: Medicaid Other

## 2013-11-28 ENCOUNTER — Encounter: Payer: Self-pay | Admitting: *Deleted

## 2013-11-28 DIAGNOSIS — K7469 Other cirrhosis of liver: Secondary | ICD-10-CM

## 2013-11-28 DIAGNOSIS — K7460 Unspecified cirrhosis of liver: Secondary | ICD-10-CM | POA: Diagnosis present

## 2013-11-28 DIAGNOSIS — B1920 Unspecified viral hepatitis C without hepatic coma: Secondary | ICD-10-CM | POA: Insufficient documentation

## 2013-11-28 DIAGNOSIS — R768 Other specified abnormal immunological findings in serum: Secondary | ICD-10-CM | POA: Diagnosis present

## 2013-11-28 DIAGNOSIS — I255 Ischemic cardiomyopathy: Secondary | ICD-10-CM

## 2013-11-28 LAB — COMPREHENSIVE METABOLIC PANEL
ALT: 79 U/L — ABNORMAL HIGH (ref 0–53)
ANION GAP: 10 (ref 5–15)
AST: 70 U/L — ABNORMAL HIGH (ref 0–37)
Albumin: 2.1 g/dL — ABNORMAL LOW (ref 3.5–5.2)
Alkaline Phosphatase: 87 U/L (ref 39–117)
BILIRUBIN TOTAL: 0.4 mg/dL (ref 0.3–1.2)
BUN: 26 mg/dL — AB (ref 6–23)
CALCIUM: 8.8 mg/dL (ref 8.4–10.5)
CHLORIDE: 103 meq/L (ref 96–112)
CO2: 26 mEq/L (ref 19–32)
CREATININE: 1.43 mg/dL — AB (ref 0.50–1.35)
GFR calc Af Amer: 60 mL/min — ABNORMAL LOW (ref >90)
GFR, EST NON AFRICAN AMERICAN: 52 mL/min — AB (ref >90)
Glucose, Bld: 128 mg/dL — ABNORMAL HIGH (ref 70–99)
Potassium: 4.1 mEq/L (ref 3.7–5.3)
Sodium: 139 mEq/L (ref 137–147)
Total Protein: 7.8 g/dL (ref 6.0–8.3)

## 2013-11-28 LAB — CBC WITH DIFFERENTIAL/PLATELET
BASOS ABS: 0 10*3/uL (ref 0.0–0.1)
Basophils Relative: 0 % (ref 0–1)
EOS PCT: 3 % (ref 0–5)
Eosinophils Absolute: 0.2 10*3/uL (ref 0.0–0.7)
HEMATOCRIT: 33.4 % — AB (ref 39.0–52.0)
HEMOGLOBIN: 11.7 g/dL — AB (ref 13.0–17.0)
Lymphocytes Relative: 32 % (ref 12–46)
Lymphs Abs: 1.8 10*3/uL (ref 0.7–4.0)
MCH: 30.7 pg (ref 26.0–34.0)
MCHC: 35 g/dL (ref 30.0–36.0)
MCV: 87.7 fL (ref 78.0–100.0)
MONO ABS: 0.4 10*3/uL (ref 0.1–1.0)
MONOS PCT: 8 % (ref 3–12)
NEUTROS ABS: 3.3 10*3/uL (ref 1.7–7.7)
Neutrophils Relative %: 58 % (ref 43–77)
Platelets: 153 10*3/uL (ref 150–400)
RBC: 3.81 MIL/uL — ABNORMAL LOW (ref 4.22–5.81)
RDW: 12.4 % (ref 11.5–15.5)
WBC: 5.6 10*3/uL (ref 4.0–10.5)

## 2013-11-28 LAB — HEPATITIS PANEL, ACUTE
HCV AB: REACTIVE — AB
HEP B C IGM: NONREACTIVE
HEP B S AG: NEGATIVE
Hep A IgM: NONREACTIVE

## 2013-11-28 LAB — GLUCOSE, CAPILLARY
GLUCOSE-CAPILLARY: 150 mg/dL — AB (ref 70–99)
Glucose-Capillary: 142 mg/dL — ABNORMAL HIGH (ref 70–99)

## 2013-11-28 LAB — HEPATITIS B CORE ANTIBODY, TOTAL: HEP B C TOTAL AB: NONREACTIVE

## 2013-11-28 MED ORDER — FUROSEMIDE 40 MG PO TABS
40.0000 mg | ORAL_TABLET | Freq: Every day | ORAL | Status: DC
Start: 2013-11-28 — End: 2014-06-12

## 2013-11-28 NOTE — Discharge Instructions (Signed)
Chest Pain (Nonspecific) °It is often hard to give a specific diagnosis for the cause of chest pain. There is always a chance that your pain could be related to something serious, such as a heart attack or a blood clot in the lungs. You need to follow up with your health care provider for further evaluation. °CAUSES  °· Heartburn. °· Pneumonia or bronchitis. °· Anxiety or stress. °· Inflammation around your heart (pericarditis) or lung (pleuritis or pleurisy). °· A blood clot in the lung. °· A collapsed lung (pneumothorax). It can develop suddenly on its own (spontaneous pneumothorax) or from trauma to the chest. °· Shingles infection (herpes zoster virus). °The chest wall is composed of bones, muscles, and cartilage. Any of these can be the source of the pain. °· The bones can be bruised by injury. °· The muscles or cartilage can be strained by coughing or overwork. °· The cartilage can be affected by inflammation and become sore (costochondritis). °DIAGNOSIS  °Lab tests or other studies may be needed to find the cause of your pain. Your health care provider may have you take a test called an ambulatory electrocardiogram (ECG). An ECG records your heartbeat patterns over a 24-hour period. You may also have other tests, such as: °· Transthoracic echocardiogram (TTE). During echocardiography, sound waves are used to evaluate how blood flows through your heart. °· Transesophageal echocardiogram (TEE). °· Cardiac monitoring. This allows your health care provider to monitor your heart rate and rhythm in real time. °· Holter monitor. This is a portable device that records your heartbeat and can help diagnose heart arrhythmias. It allows your health care provider to track your heart activity for several days, if needed. °· Stress tests by exercise or by giving medicine that makes the heart beat faster. °TREATMENT  °· Treatment depends on what may be causing your chest pain. Treatment may include: °· Acid blockers for  heartburn. °· Anti-inflammatory medicine. °· Pain medicine for inflammatory conditions. °· Antibiotics if an infection is present. °· You may be advised to change lifestyle habits. This includes stopping smoking and avoiding alcohol, caffeine, and chocolate. °· You may be advised to keep your head raised (elevated) when sleeping. This reduces the chance of acid going backward from your stomach into your esophagus. °Most of the time, nonspecific chest pain will improve within 2-3 days with rest and mild pain medicine.  °HOME CARE INSTRUCTIONS  °· If antibiotics were prescribed, take them as directed. Finish them even if you start to feel better. °· For the next few days, avoid physical activities that bring on chest pain. Continue physical activities as directed. °· Do not use any tobacco products, including cigarettes, chewing tobacco, or electronic cigarettes. °· Avoid drinking alcohol. °· Only take medicine as directed by your health care provider. °· Follow your health care provider's suggestions for further testing if your chest pain does not go away. °· Keep any follow-up appointments you made. If you do not go to an appointment, you could develop lasting (chronic) problems with pain. If there is any problem keeping an appointment, call to reschedule. °SEEK MEDICAL CARE IF:  °· Your chest pain does not go away, even after treatment. °· You have a rash with blisters on your chest. °· You have a fever. °SEEK IMMEDIATE MEDICAL CARE IF:  °· You have increased chest pain or pain that spreads to your arm, neck, jaw, back, or abdomen. °· You have shortness of breath. °· You have an increasing cough, or you cough   up blood.  You have severe back or abdominal pain.  You feel nauseous or vomit.  You have severe weakness.  You faint.  You have chills. This is an emergency. Do not wait to see if the pain will go away. Get medical help at once. Call your local emergency services (911 in [place] Do not drive  yourself to the hospital. MAKE SURE YOU:   Understand these instructions.  Will watch your condition.  Will get help right away if you are not doing well or get worse. Document Released: 10/19/2004 Document Revised: 01/14/2013 Document Reviewed: 08/15/2007 [facility] Patient Information 2015 [place], [place]. This information is not intended to replace advice given to you by your health care provider. Make sure you discuss any questions you have with your health care provider. Diabetes and Exercise Exercising regularly is important. It is not just about losing weight. It has many health benefits, such as:  Improving your overall fitness, flexibility, and endurance.  Increasing your bone density.  Helping with weight control.  Decreasing your body fat.  Increasing your muscle strength.  Reducing stress and tension.  Improving your overall health. People with diabetes who exercise gain additional benefits because exercise:  Reduces appetite.  Improves the body's use of blood sugar (glucose).  Helps lower or control blood glucose.  Decreases blood pressure.  Helps control blood lipids (such as cholesterol and triglycerides).  Improves the body's use of the hormone insulin by:  Increasing the body's insulin sensitivity.  Reducing the body's insulin needs.  Decreases the risk for heart disease because exercising:  Lowers cholesterol and triglycerides levels.  Increases the levels of good cholesterol (such as high-density lipoproteins [HDL]) in the body.  Lowers blood glucose levels. YOUR ACTIVITY PLAN  Choose an activity that you enjoy and set realistic goals. Your health care provider or diabetes educator can help you make an activity plan that works for you. Exercise regularly as directed by your health care provider. This includes:  Performing resistance training twice a week such as push-ups, sit-ups, lifting weights, or using resistance bands.  Performing 150  minutes of cardio exercises each week such as walking, running, or playing sports.  Staying active and spending no more than 90 minutes at one time being inactive. Even short bursts of exercise are good for you. Three 10-minute sessions spread throughout the day are just as beneficial as a single 30-minute session. Some exercise ideas include:  Taking the dog for a walk.  Taking the stairs instead of the elevator.  Dancing to your favorite song.  Doing an exercise video.  Doing your favorite exercise with a friend. RECOMMENDATIONS FOR EXERCISING WITH TYPE 1 OR TYPE 2 DIABETES   Check your blood glucose before exercising. If blood glucose levels are greater than 240 mg/dL, check for urine ketones. Do not exercise if ketones are present.  Avoid injecting insulin into areas of the body that are going to be exercised. For example, avoid injecting insulin into:  The arms when playing tennis.  The legs when jogging.  Keep a record of:  Food intake before and after you exercise.  Expected peak times of insulin action.  Blood glucose levels before and after you exercise.  The type and amount of exercise you have done.  Review your records with your health care provider. Your health care provider will help you to develop guidelines for adjusting food intake and insulin amounts before and after exercising.  If you take insulin or oral hypoglycemic agents, watch for  signs and symptoms of hypoglycemia. They include:  Dizziness.  Shaking.  Sweating.  Chills.  Confusion.  Drink plenty of water while you exercise to prevent dehydration or heat stroke. Body water is lost during exercise and must be replaced.  Talk to your health care provider before starting an exercise program to make sure it is safe for you. Remember, almost any type of activity is better than none. Document Released: 04/01/2003 Document Revised: 05/26/2013 Document Reviewed: 06/18/2012 [facility] Patient  Information 2015 [place], [place]. This information is not intended to replace advice given to you by your health care provider. Make sure you discuss any questions you have with your health care provider.

## 2013-11-28 NOTE — Care Management Note (Signed)
11-28-13 1321 CM received call from MD in ref to medication assistance. Pt has Medicaid and CM unable to assist. Pt goes to [facility] on [address]. CM able to contact CSW to assist with bus pass to get pt to pharmacy to get medications. No further needs at this time. Bethena Roys, RN,BSN [phone]

## 2013-11-28 NOTE — Discharge Summary (Addendum)
Physician Discharge Summary  Eric [name] [id number] DOB: 1955/08/24 DOA: 11/26/2013  PCP: [name], NP  Admit date: 11/26/2013 Discharge date: 11/28/2013  Time spent: 45 minutes  Recommendations for Outpatient Follow-up:  1. PCP Juluis Mire, NP in 1 week 2. Dr.Harwani in 1-2weeks 3. Bmet in 1 week  Discharge Diagnoses:    Chest pain   Cirrhosis   Lower extremity edema    DM type 2 (diabetes mellitus, type 2)   Hypertension   Hyperlipemia   Chest pain   Pain in the chest   Type 2 diabetes mellitus with hyperglycemia   Hepatic cirrhosis   Cardiomyopathy, ischemic   Hepatitis C antibody test positive   Discharge Condition: stable  Diet recommendation: low sodium, diabetic  Filed Weights   11/27/13 0408 11/28/13 0558  Weight: 82.6 kg (182 lb 1.6 oz) 80.831 kg (178 lb 3.2 oz)    History of present illness:  58 year old male who  has a past medical history of Hypertension; Hyperlipemia; Diabetes mellitus without complication; Edema; Cardiac arrest due to underlying cardiac condition; Obesity; and Ventricular tachycardia. Today presents to the hospital with chief complaint of chest pain and hyperglycemia.patient was seen at the PCP office and was found to have high blood sugar. Patient was transferred to the ED for further management, and Route he also complained of chest pain which was left-sided without radiation  Hospital Course:  1. CAD s/p CABG - admitted with transient chest pain, now resolved, cardiac enzymes negative - myoview completed and negative for inducible ischemia, EF 49% - continue ASA/plavix, metoprolol, imdur   2. Edema/hypoalbuminemia -due to suspected cirrhosis, nodular liver echotexture noted on [place] which is suspicious for cirrhosis -Hepatitis C Ab positive, will need further workup for this per Infectious disease -he was started on IV lasix, and then transitioned to PO lasix -edema is starting to improve, continue PO lasix and  have Bmet checked in 1 week  3. DM -resume metformin, diet compliance emphasized  4. CKD 3 -stable  5. DM neuropathy  6. Hepatitis C Ab positive -FU in ID clinic, need further workup  Procedures:  Myoview: no inducible ischemia, EF of 49%  Consultations:  Cardiology  Discharge Exam: Filed Vitals:   11/28/13 1054  BP: 152/86  Pulse:   Temp:   Resp:     General: AAOx3 Cardiovascular: S1S2/RRR Respiratory: CTAB  Discharge Instructions You were cared for by a hospitalist during your hospital stay. If you have any questions about your discharge medications or the care you received while you were in the hospital after you are discharged, you can call the unit and asked to speak with the hospitalist on call if the hospitalist that took care of you is not available. Once you are discharged, your primary care physician will handle any further medical issues. Please note that NO REFILLS for any discharge medications will be authorized once you are discharged, as it is imperative that you return to your primary care physician (or establish a relationship with a primary care physician if you do not have one) for your aftercare needs so that they can reassess your need for medications and monitor your lab values.   Current Discharge Medication List    CONTINUE these medications which have NOT CHANGED   Details  amLODipine (NORVASC) 10 MG tablet Take 10 mg by mouth daily.    aspirin EC 81 MG tablet Take 81 mg by mouth daily.    clopidogrel (PLAVIX) 75 MG tablet Take 1 tablet (75 mg  total) by mouth daily with breakfast. Qty: 30 tablet, Refills: 0    isosorbide mononitrate (IMDUR) 60 MG 24 hr tablet Take 1 tablet (60 mg total) by mouth daily. Qty: 30 tablet, Refills: 3    lisinopril (PRINIVIL) 5 MG tablet Take 1 tablet (5 mg total) by mouth daily. Qty: 30 tablet, Refills: 3    metFORMIN (GLUMETZA) 500 MG (MOD) 24 hr tablet Take 500 mg by mouth daily with breakfast.     metoprolol tartrate (LOPRESSOR) 25 MG tablet Take 25 mg by mouth every morning.     simvastatin (ZOCOR) 20 MG tablet Take 20 mg by mouth daily.    guaiFENesin (MUCINEX) 600 MG 12 hr tablet Take 1 tablet (600 mg total) by mouth 2 (two) times daily. Qty: 10 tablet, Refills: 0    oseltamivir (TAMIFLU) 75 MG capsule Take 1 capsule (75 mg total) by mouth 2 (two) times daily. Qty: 3 capsule, Refills: 0       No Known Allergies    The results of significant diagnostics from this hospitalization (including imaging, microbiology, ancillary and laboratory) are listed below for reference.    Significant Diagnostic Studies: Dg Chest 2 View  11/26/2013   CLINICAL DATA:  58 year old male with elevated blood pressure and high blood sugar. Left-sided chest pain radiating into the left arm.  EXAM: CHEST  2 VIEW  COMPARISON:  Chest x-ray 04/27/2013.  FINDINGS: Lung volumes are normal. No consolidative airspace disease. No pleural effusions. No pneumothorax. No pulmonary nodule or mass noted. Pulmonary vasculature and the cardiomediastinal silhouette are within normal limits. Status post median sternotomy. Left-sided pacemaker/AICD with lead tip projecting over the expected location of the right ventricular apex.  IMPRESSION: 1. No radiographic evidence of acute cardiopulmonary disease.   Electronically Signed   By: Vinnie Langton M.D.   On: 11/26/2013 14:10   Nm Myocar Multi W/spect W/wall Motion / Ef  11/27/2013   CLINICAL DATA:  History of coronary artery disease, post CABG and AICD placement. History of hypertension, hyperlipidemia and insulin dependent diabetes.  EXAM: MYOCARDIAL IMAGING WITH SPECT (REST AND PHARMACOLOGIC-STRESS)  GATED LEFT VENTRICULAR WALL MOTION STUDY  LEFT VENTRICULAR EJECTION FRACTION  TECHNIQUE: Standard myocardial SPECT imaging was performed after resting intravenous injection of 10 mCi Tc-43m sestamibi. Subsequently, intravenous infusion of Lexiscan was performed under the  supervision of the Cardiology staff. At peak effect of the drug, 30 mCi Tc-22m sestamibi was injected intravenously and standard myocardial SPECT imaging was performed. Quantitative gated imaging was also performed to evaluate left ventricular wall motion, and estimate left ventricular ejection fraction.  COMPARISON:  Chest radiograph -11/26/2024  FINDINGS: Raw images: There is no significant patient motion artifact. Mild-to-moderate GI attenuation is seen, worse on the provided stress images.  Perfusion: There is attenuation involving the inferior and lateral walls of the left ventricle which improves on the provided stress images and is without associated regional wall motion abnormality to suggest prior large territory infarct. No scintigraphic evidence of pharmacologically induced ischemia.  Wall Motion: There is mild hypokinesia involving the basilar aspect of the inferior wall and septum, possibly the sequela of prior CABG. Otherwise, normal wall motion.  Left Ventricular Ejection Fraction: 49 %  End diastolic volume [id number] ml  End systolic volume 62 ml  IMPRESSION: 1. No scintigraphic evidence of prior infarction or pharmacologically induced ischemia.  2. Mild hypokinesia involving the basilar aspect of the inferior wall and septum, possibly the sequela of prior median sternotomy and CABG. Otherwise, normal wall motion  3.  Left ventricular ejection fraction 49%  4.  Low-risk stress test findings*.  *2012 Appropriate Use Criteria for Coronary Revascularization Focused Update: J Am Coll Cardiol. N6492421. http://content.[email or web address].aspx?articleid=1201161   Electronically Signed   By: Sandi Mariscal M.D.   On: 11/27/2013 13:13   US Abdomen Limited Ruq  11/28/2013   CLINICAL DATA:  Hepatic cirrhosis  EXAM: US ABDOMEN LIMITED - RIGHT UPPER QUADRANT  COMPARISON:  None.  FINDINGS: Gallbladder:  Within the gallbladder, there are multiple small echogenic foci which move and shadow consistent with  gallstones. There is mild sludge in the gallbladder is well. The largest gallstone measures approximately form mm in length. There is no gallbladder wall thickening or pericholecystic fluid. No sonographic Murphy sign noted.  Common bile duct:  Diameter: 5 mm. There is no intrahepatic or extrahepatic biliary duct dilatation.  Liver:  No focal lesion identified. Liver echogenicity overall is somewhat increased. Liver has a subtly nodular contour.  Note that the right kidney appears mildly echogenic.  IMPRESSION: Cholelithiasis with mild sludge. No gallbladder wall thickening or pericholecystic fluid.  Subtly nodular contour to the liver consistent with stated diagnosis of cirrhosis. The echotexture of the liver is consistent with hepatic steatosis or underlying parenchymal disease. While no focal liver lesions are identified, it must be cautioned that the sensitivity of ultrasound for focal liver lesions is diminished in this circumstance.  Note that the right kidney echogenicity is mildly increased. This is a finding that may be seen with medical renal disease. Appropriate laboratory correlation advised.   Electronically Signed   By: Lowella Grip M.D.   On: 11/28/2013 10:49    Microbiology: No results found for this or any previous visit (from the past 240 hour(s)).   Labs: Basic Metabolic Panel:  Recent Labs Lab 11/26/13 1300 11/26/13 1650 11/27/13 0344 11/28/13 1043  NA 131*  --  136* 139  K 4.6  --  4.1 4.1  CL 96  --  101 103  CO2 27  --  26 26  GLUCOSE 442*  --  281* 128*  BUN 22  --  26* 26*  CREATININE 1.33 1.57* 1.67* 1.43*  CALCIUM 8.3*  --  8.1* 8.8   Liver Function Tests:  Recent Labs Lab 11/27/13 0344 11/28/13 1043  AST 43* 70*  ALT 58* 79*  ALKPHOS 75 87  BILITOT 0.2* 0.4  PROT 6.4 7.8  ALBUMIN 1.7* 2.1*   No results for input(s): LIPASE, AMYLASE in the last 168 hours. No results for input(s): AMMONIA in the last 168 hours. CBC:  Recent Labs Lab  11/26/13 1300 11/26/13 1650 11/27/13 0344 11/28/13 1043  WBC 4.7 5.5 4.7 5.6  NEUTROABS  --   --   --  3.3  HGB 10.4* 10.6* 9.7* 11.7*  HCT 29.6* 30.2* 28.2* 33.4*  MCV 85.8 85.6 86.0 87.7  PLT 135* 148* 125* 153   Cardiac Enzymes:  Recent Labs Lab 11/26/13 1650 11/26/13 2154 11/27/13 0344  TROPONINI <0.30 <0.30 <0.30   BNP: BNP (last 3 results)  Recent Labs  03/11/13 0340 11/26/13 1300  PROBNP 370.5* 452.5*   CBG:  Recent Labs Lab 11/27/13 1144 11/27/13 1658 11/27/13 2020 11/28/13 0743 11/28/13 1129  GLUCAP 180* 211* 125* 142* 150*       Signed:  Bradlee Bridgers  [facility] 11/28/2013, 1:09 PM

## 2013-11-28 NOTE — Progress Notes (Signed)
Subjective:  Denies any further chest pains or shortness of breath. Nuclear stress test showed no evidence of ischemia with EF of 49%.  Objective:  Vital Signs in the last 24 hours: Temp:  [97.8 F (36.6 C)-98.2 F (36.8 C)] 98.2 F (36.8 C) (11/06 0558) Pulse Rate:  [74-82] 82 (11/06 0558) BP: (119-172)/(83-91) 152/86 mmHg (11/06 1054) SpO2:  [97 %-100 %] 97 % (11/06 0558) Weight:  [80.831 kg (178 lb 3.2 oz)] 80.831 kg (178 lb 3.2 oz) (11/06 0558)  Intake/Output from previous day: 11/05 0701 - 11/06 0700 In: 720 [P.O.:720] Out: 400 [Urine:400] Intake/Output from this shift: Total I/O In: -  Out: 550 [Urine:550]  Physical Exam: Neck: no adenopathy, no carotid bruit, no JVD and supple, symmetrical, trachea midline Lungs: clear to auscultation bilaterally Heart: regular rate and rhythm, S1, S2 normal and soft systolic murmur noted Abdomen: soft, non-tender; bowel sounds normal; no masses,  no organomegaly Extremities: no clubbing cyanosis 2+ edema versus  Lab Results:  Recent Labs  11/27/13 0344 11/28/13 1043  WBC 4.7 5.6  HGB 9.7* 11.7*  PLT 125* 153    Recent Labs  11/27/13 0344 11/28/13 1043  NA 136* 139  K 4.1 4.1  CL 101 103  CO2 26 26  GLUCOSE 281* 128*  BUN 26* 26*  CREATININE 1.67* 1.43*    Recent Labs  11/26/13 2154 11/27/13 0344  TROPONINI <0.30 <0.30   Hepatic Function Panel  Recent Labs  11/28/13 1043  PROT 7.8  ALBUMIN 2.1*  AST 70*  ALT 79*  ALKPHOS 87  BILITOT 0.4   No results for input(s): CHOL in the last 72 hours. No results for input(s): PROTIME in the last 72 hours.  Imaging: Imaging results have been reviewed and Dg Chest 2 View  11/26/2013   CLINICAL DATA:  58 year old male with elevated blood pressure and high blood sugar. Left-sided chest pain radiating into the left arm.  EXAM: CHEST  2 VIEW  COMPARISON:  Chest x-ray 04/27/2013.  FINDINGS: Lung volumes are normal. No consolidative airspace disease. No pleural  effusions. No pneumothorax. No pulmonary nodule or mass noted. Pulmonary vasculature and the cardiomediastinal silhouette are within normal limits. Status post median sternotomy. Left-sided pacemaker/AICD with lead tip projecting over the expected location of the right ventricular apex.  IMPRESSION: 1. No radiographic evidence of acute cardiopulmonary disease.   Electronically Signed   By: Vinnie Langton M.D.   On: 11/26/2013 14:10   Nm Myocar Multi W/spect W/wall Motion / Ef  11/27/2013   CLINICAL DATA:  History of coronary artery disease, post CABG and AICD placement. History of hypertension, hyperlipidemia and insulin dependent diabetes.  EXAM: MYOCARDIAL IMAGING WITH SPECT (REST AND PHARMACOLOGIC-STRESS)  GATED LEFT VENTRICULAR WALL MOTION STUDY  LEFT VENTRICULAR EJECTION FRACTION  TECHNIQUE: Standard myocardial SPECT imaging was performed after resting intravenous injection of 10 mCi Tc-8m sestamibi. Subsequently, intravenous infusion of Lexiscan was performed under the supervision of the Cardiology staff. At peak effect of the drug, 30 mCi Tc-15m sestamibi was injected intravenously and standard myocardial SPECT imaging was performed. Quantitative gated imaging was also performed to evaluate left ventricular wall motion, and estimate left ventricular ejection fraction.  COMPARISON:  Chest radiograph -11/26/2024  FINDINGS: Raw images: There is no significant patient motion artifact. Mild-to-moderate GI attenuation is seen, worse on the provided stress images.  Perfusion: There is attenuation involving the inferior and lateral walls of the left ventricle which improves on the provided stress images and is without associated regional wall motion  abnormality to suggest prior large territory infarct. No scintigraphic evidence of pharmacologically induced ischemia.  Wall Motion: There is mild hypokinesia involving the basilar aspect of the inferior wall and septum, possibly the sequela of prior CABG.  Otherwise, normal wall motion.  Left Ventricular Ejection Fraction: 49 %  End diastolic volume [id number] ml  End systolic volume 62 ml  IMPRESSION: 1. No scintigraphic evidence of prior infarction or pharmacologically induced ischemia.  2. Mild hypokinesia involving the basilar aspect of the inferior wall and septum, possibly the sequela of prior median sternotomy and CABG. Otherwise, normal wall motion  3.  Left ventricular ejection fraction 49%  4.  Low-risk stress test findings*.  *2012 Appropriate Use Criteria for Coronary Revascularization Focused Update: J Am Coll Cardiol. [id number]. http://content.[email or web address].aspx?articleid=1201161   Electronically Signed   By: Sandi Mariscal M.D.   On: 11/27/2013 13:13   US Abdomen Limited Ruq  11/28/2013   CLINICAL DATA:  Hepatic cirrhosis  EXAM: US ABDOMEN LIMITED - RIGHT UPPER QUADRANT  COMPARISON:  None.  FINDINGS: Gallbladder:  Within the gallbladder, there are multiple small echogenic foci which move and shadow consistent with gallstones. There is mild sludge in the gallbladder is well. The largest gallstone measures approximately form mm in length. There is no gallbladder wall thickening or pericholecystic fluid. No sonographic Murphy sign noted.  Common bile duct:  Diameter: 5 mm. There is no intrahepatic or extrahepatic biliary duct dilatation.  Liver:  No focal lesion identified. Liver echogenicity overall is somewhat increased. Liver has a subtly nodular contour.  Note that the right kidney appears mildly echogenic.  IMPRESSION: Cholelithiasis with mild sludge. No gallbladder wall thickening or pericholecystic fluid.  Subtly nodular contour to the liver consistent with stated diagnosis of cirrhosis. The echotexture of the liver is consistent with hepatic steatosis or underlying parenchymal disease. While no focal liver lesions are identified, it must be cautioned that the sensitivity of ultrasound for focal liver lesions is diminished in this  circumstance.  Note that the right kidney echogenicity is mildly increased. This is a finding that may be seen with medical renal disease. Appropriate laboratory correlation advised.   Electronically Signed   By: Lowella Grip M.D.   On: 11/28/2013 10:49    Cardiac Studies:  Assessment/Plan:  Status post Recurrent chest pain MI ruled out by serial enzymes and EKG negative nuclear stress test Distal small vessel disease CAD status post CABG in the past History of V. Fib cardiac arrest status post ICD in the past Hypertension Uncontrolled diabetes mellitus Elevated LFTs with hypoalbuminemia workup in progress Seizure disorder Chronic anemia Acute on chronic renal insufficiency improved History of chronic hydrocephalus with dilated third and lateral ventricles Plan Continue present management  Okay to discharge from cardiac point of view. Follow-up with me as outpatient in 2 weeks I will sign off please call if needed  LOS: 2 days    Eric [name] 11/28/2013, 1:01 PM

## 2013-12-08 ENCOUNTER — Ambulatory Visit: Payer: Medicaid Other | Admitting: Neurology

## 2013-12-10 ENCOUNTER — Encounter: Payer: Self-pay | Admitting: Neurology

## 2013-12-11 ENCOUNTER — Encounter: Payer: Self-pay | Admitting: Internal Medicine

## 2013-12-16 ENCOUNTER — Encounter: Payer: Self-pay | Admitting: Neurology

## 2013-12-23 ENCOUNTER — Ambulatory Visit: Payer: Medicaid Other | Admitting: Neurology

## 2014-01-01 ENCOUNTER — Encounter (HOSPITAL_COMMUNITY): Payer: Self-pay | Admitting: Cardiology

## 2014-01-23 DIAGNOSIS — J189 Pneumonia, unspecified organism: Secondary | ICD-10-CM

## 2014-01-23 HISTORY — DX: Pneumonia, unspecified organism: J18.9

## 2014-02-02 ENCOUNTER — Ambulatory Visit: Payer: Medicaid Other | Admitting: Neurology

## 2014-02-02 ENCOUNTER — Telehealth: Payer: Self-pay | Admitting: Neurology

## 2014-02-02 NOTE — Telephone Encounter (Signed)
This patient did not show for a new patient appointment today. 

## 2014-02-05 ENCOUNTER — Encounter: Payer: Self-pay | Admitting: Internal Medicine

## 2014-02-19 ENCOUNTER — Ambulatory Visit (INDEPENDENT_AMBULATORY_CARE_PROVIDER_SITE_OTHER): Payer: Medicaid Other | Admitting: *Deleted

## 2014-02-19 DIAGNOSIS — I469 Cardiac arrest, cause unspecified: Secondary | ICD-10-CM

## 2014-02-19 DIAGNOSIS — I4901 Ventricular fibrillation: Secondary | ICD-10-CM

## 2014-02-19 LAB — MDC_IDC_ENUM_SESS_TYPE_REMOTE
Battery Remaining Longevity: 85 mo
Battery Remaining Percentage: 91 %
Battery Voltage: 3.14 V
HIGH POWER IMPEDANCE MEASURED VALUE: 56 Ohm
HighPow Impedance: 46 Ohm
Implantable Pulse Generator Serial Number: 7175540
Lead Channel Setting Pacing Pulse Width: 0.5 ms
MDC IDC MSMT LEADCHNL RV IMPEDANCE VALUE: 380 Ohm
MDC IDC MSMT LEADCHNL RV SENSING INTR AMPL: 11.8 mV
MDC IDC SESS DTM: 20160128232254
MDC IDC SET LEADCHNL RV PACING AMPLITUDE: 3.5 V
MDC IDC SET LEADCHNL RV SENSING SENSITIVITY: 0.5 mV
MDC IDC STAT BRADY RV PERCENT PACED: 0 %
Zone Setting Detection Interval: 250 ms
Zone Setting Detection Interval: 300 ms
Zone Setting Detection Interval: 375 ms

## 2014-02-20 NOTE — Progress Notes (Signed)
Remote ICD transmission.   

## 2014-02-23 ENCOUNTER — Inpatient Hospital Stay (HOSPITAL_COMMUNITY)
Admission: EM | Admit: 2014-02-23 | Discharge: 2014-02-26 | DRG: 194 | Disposition: A | Discharge status: Home / Self Care | Payer: Medicaid Other | Attending: Cardiology | Admitting: Cardiology

## 2014-02-23 ENCOUNTER — Encounter: Payer: Self-pay | Admitting: Internal Medicine

## 2014-02-23 ENCOUNTER — Encounter (HOSPITAL_COMMUNITY): Payer: Self-pay | Admitting: *Deleted

## 2014-02-23 ENCOUNTER — Emergency Department (HOSPITAL_COMMUNITY): Payer: Medicaid Other

## 2014-02-23 DIAGNOSIS — E1165 Type 2 diabetes mellitus with hyperglycemia: Secondary | ICD-10-CM | POA: Diagnosis present

## 2014-02-23 DIAGNOSIS — Z95810 Presence of automatic (implantable) cardiac defibrillator: Secondary | ICD-10-CM | POA: Diagnosis not present

## 2014-02-23 DIAGNOSIS — D649 Anemia, unspecified: Secondary | ICD-10-CM | POA: Diagnosis present

## 2014-02-23 DIAGNOSIS — I129 Hypertensive chronic kidney disease with stage 1 through stage 4 chronic kidney disease, or unspecified chronic kidney disease: Secondary | ICD-10-CM | POA: Diagnosis present

## 2014-02-23 DIAGNOSIS — R0602 Shortness of breath: Secondary | ICD-10-CM

## 2014-02-23 DIAGNOSIS — N179 Acute kidney failure, unspecified: Secondary | ICD-10-CM | POA: Diagnosis present

## 2014-02-23 DIAGNOSIS — Z951 Presence of aortocoronary bypass graft: Secondary | ICD-10-CM | POA: Diagnosis not present

## 2014-02-23 DIAGNOSIS — I255 Ischemic cardiomyopathy: Secondary | ICD-10-CM | POA: Diagnosis present

## 2014-02-23 DIAGNOSIS — Z8674 Personal history of sudden cardiac arrest: Secondary | ICD-10-CM

## 2014-02-23 DIAGNOSIS — K7460 Unspecified cirrhosis of liver: Secondary | ICD-10-CM | POA: Diagnosis present

## 2014-02-23 DIAGNOSIS — J189 Pneumonia, unspecified organism: Secondary | ICD-10-CM | POA: Diagnosis present

## 2014-02-23 DIAGNOSIS — Z8619 Personal history of other infectious and parasitic diseases: Secondary | ICD-10-CM

## 2014-02-23 DIAGNOSIS — E785 Hyperlipidemia, unspecified: Secondary | ICD-10-CM | POA: Diagnosis present

## 2014-02-23 DIAGNOSIS — Z7982 Long term (current) use of aspirin: Secondary | ICD-10-CM | POA: Diagnosis not present

## 2014-02-23 DIAGNOSIS — I2510 Atherosclerotic heart disease of native coronary artery without angina pectoris: Secondary | ICD-10-CM | POA: Diagnosis present

## 2014-02-23 DIAGNOSIS — G40909 Epilepsy, unspecified, not intractable, without status epilepticus: Secondary | ICD-10-CM | POA: Diagnosis present

## 2014-02-23 DIAGNOSIS — N183 Chronic kidney disease, stage 3 (moderate): Secondary | ICD-10-CM | POA: Diagnosis present

## 2014-02-23 DIAGNOSIS — I252 Old myocardial infarction: Secondary | ICD-10-CM

## 2014-02-23 LAB — CK TOTAL AND CKMB (NOT AT ARMC)
CK, MB: 4 ng/mL (ref 0.3–4.0)
RELATIVE INDEX: 1.4 (ref 0.0–2.5)
Total CK: 285 U/L — ABNORMAL HIGH (ref 7–232)

## 2014-02-23 LAB — CBC WITH DIFFERENTIAL/PLATELET
BASOS ABS: 0 10*3/uL (ref 0.0–0.1)
Basophils Relative: 0 % (ref 0–1)
EOS PCT: 3 % (ref 0–5)
Eosinophils Absolute: 0.2 10*3/uL (ref 0.0–0.7)
HCT: 31 % — ABNORMAL LOW (ref 39.0–52.0)
Hemoglobin: 10.8 g/dL — ABNORMAL LOW (ref 13.0–17.0)
Lymphocytes Relative: 28 % (ref 12–46)
Lymphs Abs: 2 10*3/uL (ref 0.7–4.0)
MCH: 29.9 pg (ref 26.0–34.0)
MCHC: 34.8 g/dL (ref 30.0–36.0)
MCV: 85.9 fL (ref 78.0–100.0)
MONO ABS: 0.8 10*3/uL (ref 0.1–1.0)
MONOS PCT: 11 % (ref 3–12)
NEUTROS ABS: 4.1 10*3/uL (ref 1.7–7.7)
Neutrophils Relative %: 58 % (ref 43–77)
PLATELETS: 158 10*3/uL (ref 150–400)
RBC: 3.61 MIL/uL — ABNORMAL LOW (ref 4.22–5.81)
RDW: 12.2 % (ref 11.5–15.5)
WBC: 7.1 10*3/uL (ref 4.0–10.5)

## 2014-02-23 LAB — TROPONIN I
Troponin I: 0.07 ng/mL — ABNORMAL HIGH (ref <0.031)
Troponin I: 0.04 ng/mL — ABNORMAL HIGH (ref <0.031)

## 2014-02-23 LAB — GLUCOSE, CAPILLARY
GLUCOSE-CAPILLARY: 115 mg/dL — AB (ref 70–99)
Glucose-Capillary: 228 mg/dL — ABNORMAL HIGH (ref 70–99)
Glucose-Capillary: 146 mg/dL — ABNORMAL HIGH (ref 70–99)

## 2014-02-23 LAB — COMPREHENSIVE METABOLIC PANEL
ALBUMIN: 2 g/dL — AB (ref 3.5–5.2)
ALK PHOS: 69 U/L (ref 39–117)
ALT: 27 U/L (ref 0–53)
ANION GAP: 4 — AB (ref 5–15)
AST: 28 U/L (ref 0–37)
BUN: 27 mg/dL — AB (ref 6–23)
CO2: 26 mmol/L (ref 19–32)
Calcium: 8.2 mg/dL — ABNORMAL LOW (ref 8.4–10.5)
Chloride: 101 mmol/L (ref 96–112)
Creatinine, Ser: 1.91 mg/dL — ABNORMAL HIGH (ref 0.50–1.35)
GFR calc Af Amer: 42 mL/min — ABNORMAL LOW (ref >90)
GFR calc non Af Amer: 37 mL/min — ABNORMAL LOW (ref >90)
Glucose, Bld: 280 mg/dL — ABNORMAL HIGH (ref 70–99)
POTASSIUM: 4 mmol/L (ref 3.5–5.1)
Sodium: 131 mmol/L — ABNORMAL LOW (ref 135–145)
Total Bilirubin: 0.4 mg/dL (ref 0.3–1.2)
Total Protein: 7 g/dL (ref 6.0–8.3)

## 2014-02-23 LAB — BRAIN NATRIURETIC PEPTIDE: B NATRIURETIC PEPTIDE 5: 94.4 pg/mL (ref 0.0–100.0)

## 2014-02-23 MED ORDER — ONDANSETRON HCL 4 MG/2ML IJ SOLN: 4.0000 mg | Freq: Four times a day (QID) | INTRAMUSCULAR | Status: DC | PRN

## 2014-02-23 MED ORDER — OXYCODONE HCL 5 MG PO TABS
5.0000 mg | ORAL_TABLET | ORAL | Status: DC | PRN
  Administered 2014-02-23 – 2014-02-26 (×7): 5 mg via ORAL
  Filled 2014-02-23 (×7): qty 1

## 2014-02-23 MED ORDER — ISOSORBIDE MONONITRATE ER 60 MG PO TB24
60.0000 mg | ORAL_TABLET | Freq: Once | ORAL | Status: AC
  Administered 2014-02-23: 60 mg via ORAL
  Filled 2014-02-23: qty 1

## 2014-02-23 MED ORDER — LINAGLIPTIN 5 MG PO TABS
5.0000 mg | ORAL_TABLET | Freq: Every day | ORAL | Status: DC
  Administered 2014-02-23 – 2014-02-24 (×2): 5 mg via ORAL
  Filled 2014-02-23 (×2): qty 1

## 2014-02-23 MED ORDER — NITROGLYCERIN 2 % TD OINT
0.5000 [in_us] | TOPICAL_OINTMENT | Freq: Four times a day (QID) | TRANSDERMAL | Status: DC
  Administered 2014-02-23 – 2014-02-24 (×2): 0.5 [in_us] via TOPICAL
  Filled 2014-02-23: qty 30

## 2014-02-23 MED ORDER — METOPROLOL TARTRATE 25 MG PO TABS
25.0000 mg | ORAL_TABLET | Freq: Every morning | ORAL | Status: DC
  Administered 2014-02-23 (×2): 25 mg via ORAL
  Filled 2014-02-23 (×2): qty 1

## 2014-02-23 MED ORDER — GLIPIZIDE 5 MG PO TABS
5.0000 mg | ORAL_TABLET | Freq: Every day | ORAL | Status: DC
  Administered 2014-02-23 – 2014-02-26 (×5): 5 mg via ORAL
  Filled 2014-02-23 (×6): qty 1

## 2014-02-23 MED ORDER — INSULIN ASPART 100 UNIT/ML ~~LOC~~ SOLN
0.0000 [IU] | Freq: Three times a day (TID) | SUBCUTANEOUS | Status: DC
Start: 2014-02-23 — End: 2014-02-26
  Administered 2014-02-23 (×2): 5 [IU] via SUBCUTANEOUS
  Administered 2014-02-24 (×2): 2 [IU] via SUBCUTANEOUS
  Administered 2014-02-25: 5 [IU] via SUBCUTANEOUS

## 2014-02-23 MED ORDER — ONDANSETRON HCL 4 MG PO TABS: 4.0000 mg | ORAL_TABLET | Freq: Four times a day (QID) | ORAL | Status: DC | PRN

## 2014-02-23 MED ORDER — LEVOFLOXACIN IN D5W 500 MG/100ML IV SOLN
500.0000 mg | Freq: Once | INTRAVENOUS | Status: AC
  Administered 2014-02-23: 500 mg via INTRAVENOUS
  Filled 2014-02-23: qty 100

## 2014-02-23 MED ORDER — SODIUM CHLORIDE 0.9 % IV SOLN
250.0000 mL | INTRAVENOUS | Status: DC | PRN
Start: 2014-02-23 — End: 2014-02-26

## 2014-02-23 MED ORDER — DOCUSATE SODIUM 100 MG PO CAPS
100.0000 mg | ORAL_CAPSULE | Freq: Two times a day (BID) | ORAL | Status: DC
  Administered 2014-02-23 – 2014-02-26 (×7): 100 mg via ORAL
  Filled 2014-02-23 (×9): qty 1

## 2014-02-23 MED ORDER — SODIUM CHLORIDE 0.9 % IJ SOLN
3.0000 mL | Freq: Two times a day (BID) | INTRAMUSCULAR | Status: DC
  Administered 2014-02-23 – 2014-02-25 (×5): 3 mL via INTRAVENOUS

## 2014-02-23 MED ORDER — INSULIN ASPART 100 UNIT/ML ~~LOC~~ SOLN
3.0000 [IU] | Freq: Three times a day (TID) | SUBCUTANEOUS | Status: DC
  Administered 2014-02-23 – 2014-02-26 (×9): 3 [IU] via SUBCUTANEOUS

## 2014-02-23 MED ORDER — GUAIFENESIN-DM 100-10 MG/5ML PO SYRP
5.0000 mL | ORAL_SOLUTION | ORAL | Status: DC | PRN
  Administered 2014-02-23 – 2014-02-25 (×6): 5 mL via ORAL
  Filled 2014-02-23 (×7): qty 5

## 2014-02-23 MED ORDER — SODIUM CHLORIDE 0.9 % IJ SOLN: 3.0000 mL | INTRAMUSCULAR | Status: DC | PRN

## 2014-02-23 MED ORDER — AMLODIPINE BESYLATE 5 MG PO TABS
5.0000 mg | ORAL_TABLET | Freq: Every day | ORAL | Status: DC
  Administered 2014-02-23 – 2014-02-24 (×3): 5 mg via ORAL
  Filled 2014-02-23 (×3): qty 1

## 2014-02-23 MED ORDER — FUROSEMIDE 40 MG PO TABS
40.0000 mg | ORAL_TABLET | Freq: Every day | ORAL | Status: DC
  Administered 2014-02-23 – 2014-02-26 (×4): 40 mg via ORAL
  Filled 2014-02-23: qty 1
  Filled 2014-02-23: qty 2
  Filled 2014-02-23 (×3): qty 1

## 2014-02-23 MED ORDER — HEPARIN SODIUM (PORCINE) 5000 UNIT/ML IJ SOLN
5000.0000 [IU] | Freq: Three times a day (TID) | INTRAMUSCULAR | Status: DC
  Administered 2014-02-23 – 2014-02-26 (×9): 5000 [IU] via SUBCUTANEOUS
  Filled 2014-02-23 (×13): qty 1

## 2014-02-23 MED ORDER — FUROSEMIDE 10 MG/ML IJ SOLN
60.0000 mg | Freq: Once | INTRAMUSCULAR | Status: AC
  Administered 2014-02-23: 60 mg via INTRAVENOUS
  Filled 2014-02-23: qty 6

## 2014-02-23 MED ORDER — NITROGLYCERIN IN D5W 200-5 MCG/ML-% IV SOLN
10.0000 ug/min | INTRAVENOUS | Status: DC
  Administered 2014-02-23: 10 ug/min via INTRAVENOUS
  Filled 2014-02-23: qty 250

## 2014-02-23 MED ORDER — DEXTROSE 5 % IV SOLN
1.0000 g | INTRAVENOUS | Status: DC
  Administered 2014-02-23 – 2014-02-25 (×3): 1 g via INTRAVENOUS
  Filled 2014-02-23 (×4): qty 10

## 2014-02-23 MED ORDER — METOPROLOL TARTRATE 25 MG PO TABS
25.0000 mg | ORAL_TABLET | Freq: Two times a day (BID) | ORAL | Status: DC
  Administered 2014-02-23 – 2014-02-26 (×6): 25 mg via ORAL
  Filled 2014-02-23 (×7): qty 1

## 2014-02-23 MED ORDER — ASPIRIN EC 81 MG PO TBEC
81.0000 mg | DELAYED_RELEASE_TABLET | Freq: Every day | ORAL | Status: DC
  Administered 2014-02-23 – 2014-02-26 (×4): 81 mg via ORAL
  Filled 2014-02-23 (×4): qty 1

## 2014-02-23 NOTE — ED Notes (Signed)
Pt arrives from [facility]. Pt c/o left sided chest pain describes as pressure/heaviness that began today. Pt also c/o of some SOB, dizziness, and lightheadedness. Pt states he has a cough that began today that is non productive. Pt states it hurts to breath. EMS gave 324mg  ASA and Nitro x1 SL. 178/83 100%RA

## 2014-02-23 NOTE — H&P (Signed)
Referring Physician:  [name] is an 59 y.o. male.                       Chief Complaint: Chest pain and cough  HPI: 59 y.o. male with a PMHx of HTN, hyperlipidemia, MI, and DM who presents to the Emergency Department complaining of constant, ongoing chest pain onset 5 PM this evening while at rest that is unchanged. Pt also reports SOB and constant non productive cough. With history of liver cirrhosis, high dose amlodipine and significant hypoalbuminemia patient has chronic bilateral lower leg edema. No fever or chills. Chest x-ray suggestive of left lung pneumonia. Recent cardiac work up negative for reversible ischemia. BNP is also unremarkable.  Past Medical History  Diagnosis Date  . Hypertension   . Hyperlipemia   . 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 Edema   . 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 Obesity   . Ventricular tachycardia       Past Surgical History  Procedure Laterality Date  . Coronary artery bypass graft    . Implantable cardioverter defibrillator implant  03/24/2013    STJ single chamber ICD implanted by Dr Caryl Comes for secondary prevention  . Left heart catheterization with coronary/graft angiogram  03/07/2013    Procedure: LEFT HEART CATHETERIZATION WITH [name];  Surgeon: Clent Demark, MD;  Location: [facility] CATH LAB;  Service: Cardiovascular;;  . Implantable cardioverter defibrillator implant N/A 03/24/2013    Procedure: IMPLANTABLE CARDIOVERTER DEFIBRILLATOR IMPLANT;  Surgeon: Deboraha Sprang, MD;  Location: [facility] CATH LAB;  Service: Cardiovascular;  Laterality: N/A;    Family History  Problem Relation Age of Onset  . Heart disease Mother   . Diabetes Mother    Social History:  reports that he has never smoked. He does not have any smokeless tobacco history on file. He reports that he does not drink alcohol. His drug history is not on file.  Allergies: No Known Allergies   (Not in a hospital  admission)  Results for orders placed or performed during the hospital encounter of 02/23/14 (from the past 48 hour(s))  Brain natriuretic peptide     Status: None   Collection Time: 02/23/14  1:25 AM  Result Value Ref Range   B Natriuretic Peptide 94.4 0.0 - 100.0 pg/mL  CBC with Differential/Platelet     Status: Abnormal   Collection Time: 02/23/14  1:34 AM  Result Value Ref Range   WBC 7.1 4.0 - 10.5 K/uL   RBC 3.61 (L) 4.22 - 5.81 MIL/uL   Hemoglobin 10.8 (L) 13.0 - 17.0 g/dL   HCT 31.0 (L) 39.0 - 52.0 %   MCV 85.9 78.0 - 100.0 fL   MCH 29.9 26.0 - 34.0 pg   MCHC 34.8 30.0 - 36.0 g/dL   RDW 12.2 11.5 - 15.5 %   Platelets 158 150 - 400 K/uL   Neutrophils Relative % 58 43 - 77 %   Neutro Abs 4.1 1.7 - 7.7 K/uL   Lymphocytes Relative 28 12 - 46 %   Lymphs Abs 2.0 0.7 - 4.0 K/uL   Monocytes Relative 11 3 - 12 %   Monocytes Absolute 0.8 0.1 - 1.0 K/uL   Eosinophils Relative 3 0 - 5 %   Eosinophils Absolute 0.2 0.0 - 0.7 K/uL   Basophils Relative 0 0 - 1 %   Basophils Absolute 0.0 0.0 - 0.1 K/uL  Comprehensive metabolic panel  Status: Abnormal   Collection Time: 02/23/14  1:34 AM  Result Value Ref Range   Sodium 131 (L) 135 - 145 mmol/L   Potassium 4.0 3.5 - 5.1 mmol/L   Chloride 101 96 - 112 mmol/L   CO2 26 19 - 32 mmol/L   Glucose, Bld 280 (H) 70 - 99 mg/dL   BUN 27 (H) 6 - 23 mg/dL   Creatinine, Ser 1.91 (H) 0.50 - 1.35 mg/dL   Calcium 8.2 (L) 8.4 - 10.5 mg/dL   Total Protein 7.0 6.0 - 8.3 g/dL   Albumin 2.0 (L) 3.5 - 5.2 g/dL   AST 28 0 - 37 U/L   ALT 27 0 - 53 U/L   Alkaline Phosphatase 69 39 - 117 U/L   Total Bilirubin 0.4 0.3 - 1.2 mg/dL   GFR calc non Af Amer 37 (L) >90 mL/min   GFR calc Af Amer 42 (L) >90 mL/min    Comment: (NOTE) The eGFR has been calculated using the CKD EPI equation. This calculation has not been validated in all clinical situations. eGFR's persistently <90 mL/min signify possible Chronic Kidney Disease.    Anion gap 4 (L) 5 - 15   Troponin I     Status: Abnormal   Collection Time: 02/23/14  1:34 AM  Result Value Ref Range   Troponin I 0.07 (H) <0.031 ng/mL    Comment:        PERSISTENTLY INCREASED TROPONIN VALUES IN THE RANGE OF 0.04-0.49 ng/mL CAN BE SEEN IN:       -UNSTABLE ANGINA       -CONGESTIVE HEART FAILURE       -MYOCARDITIS       -CHEST TRAUMA       -ARRYHTHMIAS       -LATE PRESENTING MYOCARDIAL INFARCTION       -COPD   CLINICAL FOLLOW-UP RECOMMENDED.    Dg Chest Port 1 View  02/23/2014   CLINICAL DATA:  Acute onset of left-sided chest pain and heaviness, with shortness of breath. Initial encounter.  EXAM: PORTABLE CHEST - 1 VIEW  COMPARISON:  Chest radiograph performed 11/26/2013  FINDINGS: The lungs are well-aerated. Mild left basilar airspace opacity raises concern for mild pneumonia. Interstitial edema is thought to be less likely. There is no evidence of pleural effusion or pneumothorax.  The cardiomediastinal silhouette is borderline normal in size. The patient is status post median sternotomy. An AICD is noted overlying the left chest wall, with single lead ending overlying the right ventricle No acute osseous abnormalities are seen.  IMPRESSION: Mild left basilar airspace opacity raises a concern for mild pneumonia. Interstitial edema is thought to be less likely.   Electronically Signed   By: Garald Balding M.D.   On: 02/23/2014 02:06    Review Of Systems HEENT: Headache, blurred vision, runny nose, sore throat Neck: Hypothyroidism, hyperthyroidism,,lymphadenopathy Chest : Positive for Shortness of breath, No history of COPD, Asthma Heart : Positive for Chest pain, history of coronary arterey disease GI: Nausea, vomiting, diarrhea, constipation, GERD GU: Dysuria, urgency, frequency of urination, hematuria Neuro: Stroke, seizures, syncope Psych: Depression, anxiety, hallucinations  Physical Exam: Blood pressure 147/92, pulse 82, temperature 98.1 F (36.7 C), temperature source Oral, resp.  rate 24, height _0  (1.676 m), weight 73.483 kg (162 lb), SpO2 97 %. Constitutional: Patient is a well-developed and well-nourished male * in no acute distress and cooperative with exam. Head: Normocephalic and atraumatic Mouth: Mucus membranes moist Eyes: PERRL, EOMI, conjunctivae normal Neck: Supple, No Thyromegaly  Cardiovascular: RRR, S1 normal, S2 normal Pulmonary/Chest: Left base few crackles. No wheezes. Left precordium tender on palpation. Abdominal: Soft. Non-tender, non-distended, bowel sounds are normal, no masses, organomegaly, or guarding present.  Neurological: A&O x3, Strenght is normal and symmetric bilaterally, cranial nerve II-XII are grossly intact, no focal motor deficit, sensory intact to light touch bilaterally.  Extremities : bilateral 2+ pitting edema of the lower extremities. Skin: Warm and dry.  Assessment/Plan Left lung pneumonia DM, II Chest pain, musculoskeletal Hypertension Hyperlipidemia Hepatic cirrhosis Cardiomyopathy, ischemic Hepatitis C antibody test positive Bilateral lower extremity edema  Place in observation. IV rocephin. Home medications. Hold metformin.   Birdie Riddle, MD  02/23/2014, 4:09 AM

## 2014-02-23 NOTE — ED Notes (Signed)
Dr Doylene Canard notified of patient's SBP.  Patient medicated per verbal orders by Dr Doylene Canard.

## 2014-02-23 NOTE — Progress Notes (Signed)
Charge Nurse spoke with Emergency Room RN regarding patient "chest pain".  Emergency Room RN and Hospitalist MD attribute "chest pain" as relating to musculoskeletal and not cardiac.  Charge RN to receive patient from ER with no active chest pain. Patient to be placed on telemetry and upon arrival to unit.

## 2014-02-23 NOTE — Progress Notes (Signed)
Subjective:  Patient complains of vague chest pain without associated symptoms. States occasionally chest pain increases with deep breathing and coughing.  Objective:  Vital Signs in the last 24 hours: Temp:  [98 F (36.7 C)-98.1 F (36.7 C)] 98 F (36.7 C) (02/01 0700) Pulse Rate:  [70-95] 90 (02/01 1151) Resp:  [13-24] 15 (02/01 0700) BP: (127-204)/(82-163) 143/96 mmHg (02/01 1151) SpO2:  [96 %-100 %] 98 % (02/01 1151) Weight:  [73.483 kg (162 lb)] 73.483 kg (162 lb) (02/01 0120)  Intake/Output from previous day: 01/31 0701 - 02/01 0700 In: 100 [I.V.:100] Out: 1150 [Urine:1150] Intake/Output from this shift:    Physical Exam: Neck: no adenopathy, no carotid bruit, no JVD and supple, symmetrical, trachea midline Lungs: Decrease breath or at bases with occasional rhonchi at left base Heart: regular rate and rhythm, S1, S2 normal and Soft systolic murmur noted no pericardial rub Abdomen: soft, non-tender; bowel sounds normal; no masses,  no organomegaly Extremities: No clubbing cyanosis 1+ edema noted  Lab Results:  Recent Labs  02/23/14 0134  WBC 7.1  HGB 10.8*  PLT 158    Recent Labs  02/23/14 0134  NA 131*  K 4.0  CL 101  CO2 26  GLUCOSE 280*  BUN 27*  CREATININE 1.91*    Recent Labs  02/23/14 0134  TROPONINI 0.07*   Hepatic Function Panel  Recent Labs  02/23/14 0134  PROT 7.0  ALBUMIN 2.0*  AST 28  ALT 27  ALKPHOS 69  BILITOT 0.4   No results for input(s): CHOL in the last 72 hours. No results for input(s): PROTIME in the last 72 hours.  Imaging: Imaging results have been reviewed and Dg Chest Port 1 View  02/23/2014   CLINICAL DATA:  Acute onset of left-sided chest pain and heaviness, with shortness of breath. Initial encounter.  EXAM: PORTABLE CHEST - 1 VIEW  COMPARISON:  Chest radiograph performed 11/26/2013  FINDINGS: The lungs are well-aerated. Mild left basilar airspace opacity raises concern for mild pneumonia. Interstitial edema is  thought to be less likely. There is no evidence of pleural effusion or pneumothorax.  The cardiomediastinal silhouette is borderline normal in size. The patient is status post median sternotomy. An AICD is noted overlying the left chest wall, with single lead ending overlying the right ventricle No acute osseous abnormalities are seen.  IMPRESSION: Mild left basilar airspace opacity raises a concern for mild pneumonia. Interstitial edema is thought to be less likely.   Electronically Signed   By: Garald Balding M.D.   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 Fib cardiac arrest status post ICD in the past Hypertension Uncontrolled diabetes mellitus History of hepatitis C Cirrhosis of liver Elevated LFTs with hypoalbuminemia  Acute on chronic kidney disease stage III Seizure disorder Chronic anemia Acute on chronic renal insufficiency improved History of chronic hydrocephalus with dilated third and lateral ventricles Plan Continue present management Check labs in a.m.  LOS: 0 days    Eric [name] 02/23/2014, 12:32 PM

## 2014-02-23 NOTE — Progress Notes (Signed)
UR completed 

## 2014-02-23 NOTE — ED Provider Notes (Signed)
CSN: [id number]     Arrival date & time 02/23/14  0105 History   First MD Initiated Contact with Patient 02/23/14 0110     This chart was scribed for Clent Demark, MD by Forrestine Him, ED Scribe. This patient was seen in room D30C/D30C and the patient's care was started 1:22 AM.   Chief Complaint  Patient presents with  . Chest Pain  . Shortness of Breath  . Cough   HPI  HPI Comments: Eric [name] brought in by EMS  is a 59 y.o. male with a PMHx of HTN, hyperlipidemia, MI, and DM who presents to the Emergency Department complaining of constant, ongoing chest pain onset 5 PM this evening while at rest that is unchanged. He describes CP as heaviness. Pt with history of MI but states currently symptoms do not feel similar. No alleviating or aggravating factors at this time. Pt also reports SOB and constant non productive cough. Mr. Hohlt admits to lower extremity swelling but states this is baseline for him. Pt takes Furosemide daily for swelling. Pt was given Nitro and 324 mg ASA en route to ED without any improvement for symptoms. Last follow up with cardiologist 1 week ago with next scheduled follow up in 1 month. No known allergies to medications.  Pt is followed by Dr. Terrence Dupont  Past Medical History  Diagnosis Date  . Hypertension   . Hyperlipemia   . 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 Edema   . 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 Obesity   . Ventricular tachycardia    Past Surgical History  Procedure Laterality Date  . Coronary artery bypass graft    . Implantable cardioverter defibrillator implant  03/24/2013    STJ single chamber ICD implanted by Dr Caryl Comes for secondary prevention  . Left heart catheterization with coronary/graft angiogram  03/07/2013    Procedure: LEFT HEART CATHETERIZATION WITH [name];  Surgeon: Clent Demark, MD;  Location: [facility] CATH LAB;  Service: Cardiovascular;;  . Implantable  cardioverter defibrillator implant N/A 03/24/2013    Procedure: IMPLANTABLE CARDIOVERTER DEFIBRILLATOR IMPLANT;  Surgeon: Deboraha Sprang, MD;  Location: [facility] CATH LAB;  Service: Cardiovascular;  Laterality: N/A;   Family History  Problem Relation Age of Onset  . Heart disease Mother   . Diabetes Mother    History  Substance Use Topics  . Smoking status: Never Smoker   . Smokeless tobacco: Not on file  . Alcohol Use: No    Review of Systems  Constitutional: Negative for fever and chills.  Respiratory: Positive for cough and shortness of breath.   Cardiovascular: Positive for chest pain and leg swelling. Negative for palpitations.  Gastrointestinal: Negative for nausea, vomiting and abdominal pain.  Musculoskeletal: Negative for back pain, neck pain and neck stiffness.  Skin: Negative for rash and wound.  Neurological: Negative for dizziness, weakness, light-headedness, numbness and headaches.  All other systems reviewed and are negative.     Allergies  Review of patient's allergies indicates no known allergies.  Home Medications   Prior to Admission medications   Medication Sig Start Date End Date Taking? Authorizing Provider  amLODipine (NORVASC) 10 MG tablet Take 10 mg by mouth daily.   Yes Historical Provider, MD  aspirin EC 81 MG tablet Take 81 mg by mouth daily.   Yes Historical Provider, MD  furosemide (LASIX) 40 MG tablet Take 1 tablet (40 mg total) by mouth daily. 11/28/13  Yes Preetha  Broadus John, MD  glipiZIDE (GLUCOTROL) 5 MG tablet Take 5 mg by mouth daily before breakfast.   Yes Historical Provider, MD  isosorbide mononitrate (IMDUR) 60 MG 24 hr tablet Take 1 tablet (60 mg total) by mouth daily. 03/25/13  Yes Ripudeep Krystal Eaton, MD  metFORMIN (GLUCOPHAGE) 500 MG tablet Take 500 mg by mouth 2 (two) times daily with a meal.   Yes Historical Provider, MD  metoprolol tartrate (LOPRESSOR) 25 MG tablet Take 25 mg by mouth every morning.    Yes Historical Provider, MD  potassium  chloride (K-DUR,KLOR-CON) 10 MEQ tablet Take 10 mEq by mouth 2 (two) times daily.   Yes Historical Provider, MD   Triage Vitals: BP 147/92 mmHg  Pulse 82  Temp(Src) 98.1 F (36.7 C) (Oral)  Resp 24  Ht 5\' 6"  (1.676 m)  Wt 162 lb (73.483 kg)  BMI 26.16 kg/m2  SpO2 97%   Physical Exam  Constitutional: He is oriented to person, place, and time. He appears well-developed and well-nourished. No distress.  HENT:  Head: Normocephalic and atraumatic.  Mouth/Throat: Oropharynx is clear and moist.  Eyes: EOM are normal. Pupils are equal, round, and reactive to light.  Neck: Normal range of motion. Neck supple.  Cardiovascular: Normal rate and regular rhythm.   Pulmonary/Chest: Effort normal. No respiratory distress. He has no wheezes. He has no rales. He exhibits no tenderness.  Decreased breath sounds bilateral bases  Abdominal: Soft. Bowel sounds are normal. He exhibits no distension and no mass. There is no tenderness. There is no rebound and no guarding.  Musculoskeletal: Normal range of motion. He exhibits edema. He exhibits no tenderness.  2+ pitting edema bilateral lower extremities.  Neurological: He is alert and oriented to person, place, and time.  Skin: Skin is warm and dry. No rash noted. No erythema.  Psychiatric: He has a normal mood and affect. His behavior is normal.  Nursing note and vitals reviewed.   ED Course  Procedures (including critical care time)  DIAGNOSTIC STUDIES: Oxygen Saturation is 99% on RA, Normal by my interpretation.    COORDINATION OF CARE: 1:23 AM-Discussed treatment plan with pt at bedside and pt agreed to plan.     Labs Review Labs Reviewed  CBC WITH DIFFERENTIAL/PLATELET - Abnormal; Notable for the following:    RBC 3.61 (*)    Hemoglobin 10.8 (*)    HCT 31.0 (*)    All other components within normal limits  COMPREHENSIVE METABOLIC PANEL - Abnormal; Notable for the following:    Sodium 131 (*)    Glucose, Bld 280 (*)    BUN 27 (*)     Creatinine, Ser 1.91 (*)    Calcium 8.2 (*)    Albumin 2.0 (*)    GFR calc non Af Amer 37 (*)    GFR calc Af Amer 42 (*)    Anion gap 4 (*)    All other components within normal limits  TROPONIN I - Abnormal; Notable for the following:    Troponin I 0.07 (*)    All other components within normal limits  BRAIN NATRIURETIC PEPTIDE  HEMOGLOBIN A1C    Imaging Review Dg Chest Port 1 View  02/23/2014   CLINICAL DATA:  Acute onset of left-sided chest pain and heaviness, with shortness of breath. Initial encounter.  EXAM: PORTABLE CHEST - 1 VIEW  COMPARISON:  Chest radiograph performed 11/26/2013  FINDINGS: The lungs are well-aerated. Mild left basilar airspace opacity raises concern for mild pneumonia. Interstitial edema is thought to  be less likely. There is no evidence of pleural effusion or pneumothorax.  The cardiomediastinal silhouette is borderline normal in size. The patient is status post median sternotomy. An AICD is noted overlying the left chest wall, with single lead ending overlying the right ventricle No acute osseous abnormalities are seen.  IMPRESSION: Mild left basilar airspace opacity raises a concern for mild pneumonia. Interstitial edema is thought to be less likely.   Electronically Signed   By: Garald Balding M.D.   On: 02/23/2014 02:06     EKG Interpretation   Date/Time:  Monday February 23 2014 01:23:32 EST Ventricular Rate:  91 PR Interval:  142 QRS Duration: 85 QT Interval:  349 QTC Calculation: 429 R Axis:   20 Text Interpretation:  Sinus rhythm ST elevation, consider anterior injury  Baseline wander in lead(s) II III aVR aVF Confirmed by Harrietta Incorvaia  MD,  Ajanae Virag ([id number]) on 02/23/2014 3:58:20 AM     EKG with early re-pole in anterior leads and no reciprocal changes. This is seen on previous EKGs. MDM   Final diagnoses:  SOB (shortness of breath)    I personally performed the services described in this documentation, which was scribed in my presence. The recorded  information has been reviewed and is accurate.  Discussed with Dr.Kadakia. Will see the patient in the emergency department    Julianne Rice, MD 02/23/14 [phone]

## 2014-02-24 LAB — BASIC METABOLIC PANEL
ANION GAP: 4 — AB (ref 5–15)
BUN: 33 mg/dL — ABNORMAL HIGH (ref 6–23)
CO2: 27 mmol/L (ref 19–32)
Calcium: 7.9 mg/dL — ABNORMAL LOW (ref 8.4–10.5)
Chloride: 104 mmol/L (ref 96–112)
Creatinine, Ser: 2.05 mg/dL — ABNORMAL HIGH (ref 0.50–1.35)
GFR calc non Af Amer: 34 mL/min — ABNORMAL LOW (ref >90)
GFR, EST AFRICAN AMERICAN: 39 mL/min — AB (ref >90)
GLUCOSE: 116 mg/dL — AB (ref 70–99)
Potassium: 4 mmol/L (ref 3.5–5.1)
SODIUM: 135 mmol/L (ref 135–145)

## 2014-02-24 LAB — GLUCOSE, CAPILLARY
GLUCOSE-CAPILLARY: 125 mg/dL — AB (ref 70–99)
GLUCOSE-CAPILLARY: 118 mg/dL — AB (ref 70–99)
GLUCOSE-CAPILLARY: 128 mg/dL — AB (ref 70–99)
Glucose-Capillary: 139 mg/dL — ABNORMAL HIGH (ref 70–99)

## 2014-02-24 LAB — CBC
HCT: 28.8 % — ABNORMAL LOW (ref 39.0–52.0)
Hemoglobin: 10 g/dL — ABNORMAL LOW (ref 13.0–17.0)
MCH: 30.4 pg (ref 26.0–34.0)
MCHC: 34.7 g/dL (ref 30.0–36.0)
MCV: 87.5 fL (ref 78.0–100.0)
PLATELETS: 151 10*3/uL (ref 150–400)
RBC: 3.29 MIL/uL — AB (ref 4.22–5.81)
RDW: 12.5 % (ref 11.5–15.5)
WBC: 6.2 10*3/uL (ref 4.0–10.5)

## 2014-02-24 LAB — TROPONIN I: Troponin I: 0.04 ng/mL — ABNORMAL HIGH (ref <0.031)

## 2014-02-24 LAB — HEMOGLOBIN A1C
HEMOGLOBIN A1C: 7 % — AB (ref 4.8–5.6)
MEAN PLASMA GLUCOSE: 154 mg/dL

## 2014-02-24 MED ORDER — ISOSORBIDE MONONITRATE ER 60 MG PO TB24
60.0000 mg | ORAL_TABLET | Freq: Every day | ORAL | Status: DC
  Administered 2014-02-24 – 2014-02-26 (×3): 60 mg via ORAL
  Filled 2014-02-24 (×3): qty 1

## 2014-02-24 NOTE — Progress Notes (Signed)
Subjective:  Denies any anginal chest pain states coughing has improved.  Objective:  Vital Signs in the last 24 hours: Temp:  [98 F (36.7 C)-99.4 F (37.4 C)] 99.3 F (37.4 C) (02/02 0442) Pulse Rate:  [75-102] 81 (02/02 1019) Resp:  [16-18] 16 (02/02 0442) BP: (114-179)/(72-100) 126/81 mmHg (02/02 1020) SpO2:  [97 %-100 %] 97 % (02/02 0442)  Intake/Output from previous day: 02/01 0701 - 02/02 0700 In: 290 [P.O.:240; IV Piggyback:50] Out: -  Intake/Output from this shift: Total I/O In: 360 [P.O.:360] Out: -   Physical Exam: Neck: no adenopathy, no carotid bruit, no JVD and supple, symmetrical, trachea midline Lungs: Decrease pressor at bases with occasional left basilar rhonchi Heart: regular rate and rhythm, S1, S2 normal and Soft systolic murmur noted Abdomen: soft, non-tender; bowel sounds normal; no masses,  no organomegaly Extremities: No clubbing cyanosis 1+ edema noted  Lab Results:  Recent Labs  02/23/14 0134 02/24/14 0412  WBC 7.1 6.2  HGB 10.8* 10.0*  PLT 158 151    Recent Labs  02/23/14 0134 02/24/14 0412  NA 131* 135  K 4.0 4.0  CL 101 104  CO2 26 27  GLUCOSE 280* 116*  BUN 27* 33*  CREATININE 1.91* 2.05*    Recent Labs  02/23/14 2212 02/24/14 0412  TROPONINI 0.04* 0.04*   Hepatic Function Panel  Recent Labs  02/23/14 0134  PROT 7.0  ALBUMIN 2.0*  AST 28  ALT 27  ALKPHOS 69  BILITOT 0.4   No results for input(s): CHOL in the last 72 hours. No results for input(s): PROTIME in the last 72 hours.  Imaging: Imaging results have been reviewed and Dg Chest Port 1 View  02/23/2014   CLINICAL DATA:  Acute onset of left-sided chest pain and heaviness, with shortness of breath. Initial encounter.  EXAM: PORTABLE CHEST - 1 VIEW  COMPARISON:  Chest radiograph performed 11/26/2013  FINDINGS: The lungs are well-aerated. Mild left basilar airspace opacity raises concern for mild pneumonia. Interstitial edema is thought to be less likely.  There is no evidence of pleural effusion or pneumothorax.  The cardiomediastinal silhouette is borderline normal in size. The patient is status post median sternotomy. An AICD is noted overlying the left chest wall, with single lead ending overlying the right ventricle No acute osseous abnormalities are seen.  IMPRESSION: Mild left basilar airspace opacity raises a concern for mild pneumonia. Interstitial edema is thought to be less likely.   Electronically Signed   By: Garald Balding M.D.   On: 02/23/2014 02:06    Cardiac Studies:  Assessment/Plan:  Status post Atypical chest pain Left lower lobe pneumonia CAD status post CABG in the past History of V. Fib cardiac arrest status post ICD in the past Hypertension Uncontrolled diabetes mellitus History of hepatitis C Cirrhosis of liver Elevated LFTs with hypoalbuminemia  Acute on chronic kidney disease stage III Seizure disorder Chronic anemia Acute on chronic renal insufficiency  History of chronic hydrocephalus with dilated third and lateral ventricles Plan As per orders   LOS: 1 day    Eric [name] N 02/24/2014, 11:39 AM

## 2014-02-24 NOTE — Progress Notes (Signed)
Tech offered Pt a bath, Pt stated wife will assist with bath. Tech gave Pt all bath supplies.

## 2014-02-25 ENCOUNTER — Inpatient Hospital Stay (HOSPITAL_COMMUNITY): Payer: Medicaid Other

## 2014-02-25 LAB — BASIC METABOLIC PANEL
Anion gap: 6 (ref 5–15)
BUN: 36 mg/dL — AB (ref 6–23)
CALCIUM: 8.3 mg/dL — AB (ref 8.4–10.5)
CO2: 26 mmol/L (ref 19–32)
Chloride: 104 mmol/L (ref 96–112)
Creatinine, Ser: 2.11 mg/dL — ABNORMAL HIGH (ref 0.50–1.35)
GFR calc Af Amer: 38 mL/min — ABNORMAL LOW (ref >90)
GFR calc non Af Amer: 32 mL/min — ABNORMAL LOW (ref >90)
Glucose, Bld: 90 mg/dL (ref 70–99)
POTASSIUM: 4.6 mmol/L (ref 3.5–5.1)
Sodium: 136 mmol/L (ref 135–145)

## 2014-02-25 LAB — CBC
HCT: 29.6 % — ABNORMAL LOW (ref 39.0–52.0)
Hemoglobin: 10.3 g/dL — ABNORMAL LOW (ref 13.0–17.0)
MCH: 30.1 pg (ref 26.0–34.0)
MCHC: 34.8 g/dL (ref 30.0–36.0)
MCV: 86.5 fL (ref 78.0–100.0)
PLATELETS: 165 10*3/uL (ref 150–400)
RBC: 3.42 MIL/uL — AB (ref 4.22–5.81)
RDW: 12.4 % (ref 11.5–15.5)
WBC: 6.3 10*3/uL (ref 4.0–10.5)

## 2014-02-25 LAB — GLUCOSE, CAPILLARY
GLUCOSE-CAPILLARY: 106 mg/dL — AB (ref 70–99)
Glucose-Capillary: 236 mg/dL — ABNORMAL HIGH (ref 70–99)
Glucose-Capillary: 120 mg/dL — ABNORMAL HIGH (ref 70–99)

## 2014-02-25 MED ORDER — LEVOFLOXACIN 250 MG PO TABS
250.0000 mg | ORAL_TABLET | Freq: Every day | ORAL | Status: DC
  Administered 2014-02-25 – 2014-02-26 (×2): 250 mg via ORAL
  Filled 2014-02-25 (×2): qty 1

## 2014-02-25 NOTE — Progress Notes (Signed)
Subjective:  Patient denies any chest pain states breathing and coughing has improved. Renal function progressively getting worst  Objective:  Vital Signs in the last 24 hours: Temp:  [98 F (36.7 C)-98.2 F (36.8 C)] 98 F (36.7 C) (02/03 0531) Pulse Rate:  [69-81] 78 (02/03 1049) Resp:  [16-18] 18 (02/03 0531) BP: (132-158)/(74-90) 143/86 mmHg (02/03 1049) SpO2:  [97 %-99 %] 97 % (02/03 0531) Weight:  [81.466 kg (179 lb 9.6 oz)] 81.466 kg (179 lb 9.6 oz) (02/03 0531)  Intake/Output from previous day: 02/02 0701 - 02/03 0700 In: 840 [P.O.:840] Out: -  Intake/Output from this shift: Total I/O In: 240 [P.O.:240] Out: 250 [Urine:250]  Physical Exam: Neck: no adenopathy, no carotid bruit, no JVD and supple, symmetrical, trachea midline Lungs: Decreased breath sound at bases with occasional left basilar rhonchi air entry improved Heart: regular rate and rhythm, S1, S2 normal and Soft systolic murmur noted Abdomen: soft, non-tender; bowel sounds normal; no masses,  no organomegaly Extremities: No clubbing cyanosis 2+ edema noted  Lab Results:  Recent Labs  02/24/14 0412 02/25/14 0444  WBC 6.2 6.3  HGB 10.0* 10.3*  PLT 151 165    Recent Labs  02/24/14 0412 02/25/14 0444  NA 135 136  K 4.0 4.6  CL 104 104  CO2 27 26  GLUCOSE 116* 90  BUN 33* 36*  CREATININE 2.05* 2.11*    Recent Labs  02/23/14 2212 02/24/14 0412  TROPONINI 0.04* 0.04*   Hepatic Function Panel  Recent Labs  02/23/14 0134  PROT 7.0  ALBUMIN 2.0*  AST 28  ALT 27  ALKPHOS 69  BILITOT 0.4   No results for input(s): CHOL in the last 72 hours. No results for input(s): PROTIME in the last 72 hours.  Imaging: Imaging results have been reviewed and Dg Chest 2 View  02/25/2014   CLINICAL DATA:  Shortness of breath.  EXAM: CHEST  2 VIEW  COMPARISON:  02/23/2014.  FINDINGS: Mediastinum and hilar structures normal. Prior median sternotomy. Cardiac pacer noted lead tip in right ventricle. Low  lung volumes. Interval clearing of left lower lobe infiltrate. No pleural effusion or pneumothorax. No acute osseus abnormality.  IMPRESSION: 1. Interim clearing of left lower lobe infiltrate. 2. Prior CABG.  Cardiac pacer in stable position.  No CHF.   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 Possible discharge tomorrow if stable  LOS: 2 days    Eric [name] 02/25/2014, 11:50 AM

## 2014-02-26 LAB — GLUCOSE, CAPILLARY
GLUCOSE-CAPILLARY: 103 mg/dL — AB (ref 70–99)
GLUCOSE-CAPILLARY: 96 mg/dL (ref 70–99)

## 2014-02-26 MED ORDER — LEVOFLOXACIN 250 MG PO TABS: 250.0000 mg | ORAL_TABLET | Freq: Every day | ORAL | Status: DC

## 2014-02-26 NOTE — Discharge Summary (Signed)
NAMEKAMDEN, LEAPER              ACCOUNT NO.:  [id number]  MEDICAL RECORD NO.:  [id number]  LOCATION:  [id number]                        FACILITY:  [facility]  PHYSICIAN:  Allegra Lai. Terrence Dupont, M.D. DATE OF BIRTH:  1955-09-23  DATE OF ADMISSION:  02/23/2014 DATE OF DISCHARGE:  02/26/2014                              DISCHARGE SUMMARY   ADMITTING DIAGNOSES: 1. Left lung pneumonia. 2. Diabetes mellitus. 3. Musculoskeletal chest pain. 4. Hypertension. 5. Hypercholesteremia. 6. Cirrhosis of liver. 7. History of hepatitis C. 8. Ischemic cardiomyopathy. 9. Bilateral lower leg edema.  DISCHARGE DIAGNOSES: 1. Resolving left lower lobe pneumonia status post atypical chest     pain. 2. Coronary artery disease, status post coronary artery bypass graft     in the past. 3. History of ventricular fibrillation, cardiac arrest in the past     status post ICD in the past. 4. Hypertension. 5. Uncontrolled diabetes mellitus. 6. History of hepatitis C. 7. Cirrhosis of liver. 8. Elevated LFTs with hypoalbuminemia secondary to above. 9. Acute on chronic kidney disease, stage 3. 10.Seizure disorder. 11.Chronic anemia. 12.History of chronic hydrocephalus with dilated third and lateral     ventricles.  DISCHARGE HOME MEDICATIONS: 1. Levaquin 250 mg 1 tablet daily for 7 days. 2. Amlodipine 10 mg daily. 3. Aspirin 81 mg 1 tablet daily. 4. Lasix 40 mg daily. 5. Glipizide 5 mg daily. 6. Imdur 60 mg daily. 7. Metoprolol tartrate 25 mg twice daily. 8. Potassium chloride 10 mEq daily. 9. The patient advised to stop metformin.  DIET:  Low-salt, low-cholesterol 1800 calories ADA diet.  The patient has been advised to monitor blood pressure and blood sugar daily and chart.  The patient will follow up with me in 2 weeks and PMD and GI as scheduled as outpatient.  We will repeat his basic metabolic panel as outpatient in 2 weeks.  CONDITION AT DISCHARGE:  Stable.  The patient has been advised to avoid  metformin in view of progressive worsening renal function for now.  BRIEF HISTORY AND HOSPITAL COURSE:  Mr. Methot is 59 year old male with past medical history significant for multiple medical problems, i.e., coronary artery disease, status post CABG in the past, status post VFib arrest in the past, status post ICD, hypertension, diabetes mellitus, history of hepatitis C, history of cirrhosis of liver with hypoalbuminemia, chronic leg swelling, history of chronic kidney disease, seizure disorder, chronic anemia, history of chronic hydrocephalus with dilated third and lateral ventricle, came to ER complaining of constant ongoing chest pain, onset 5 p.m. this evening while at rest which is unchanged.  The patient also reports shortness of breath and caused nonproductive cough.  The patient denies any fever or chills.  X-ray done in the ED suggestive of left lower lobe pneumonia. The patient had recent cardiac workup with negative nuclear stress test with EF of 49%.  His BNP also was unremarkable.  PAST MEDICAL HISTORY:  As above.  PHYSICAL EXAMINATION:  GENERAL:  He was alert, awake, oriented x3. VITAL SIGNS:  Blood pressure was 147/92, pulse was 82.  He was afebrile. EYES:  Conjunctiva was pink. NECK:  Supple.  No JVD. LUNGS:  Left base few crackles.  No wheezing.  Left pericardium  was tender to palpation. CARDIOVASCULAR:  Regular rate and rhythm.  S1, S2 were normal. ABDOMEN:  Soft, nondistended.  Bowel sounds were present. NEUROLOGIC:  Grossly intact. EXTREMITIES:  2+ pitting edema on lower extremities.  LABORATORY DATA:  His labs, sodium was 131, potassium 4.0, BUN 27, creatinine 1.91, glucose was 280.  BNP was 94.4.  Troponin I was 0.07. Repeat troponin I was 0.04 x2.  Hemoglobin was 10.8, hematocrit 31, white count of 7.1.  EKG showed normal sinus rhythm with nonspecific T- wave changes.  No significant new acute ischemic changes were noted. Chest x-ray showed left lower  lobe infiltrate.  Repeat x-ray showed clearing of the infiltrate.  BRIEF HOSPITAL COURSE:  The patient was admitted to telemetry unit.  The patient was started on IV Rocephin with improvement in the symptoms. The patient remained afebrile during the hospital stay.  His metformin was discontinued in view of progressive worsening renal function.  IV Rocephin was switched to p.o.  Levaquin.  The patient's cough was completely resolved.  The patient had musculoskeletal chest pain, reviewed old angiogram.  Patient had small-vessel distal PTA stenosis which is not suitable for PCI and also patient recently had nuclear stress test which showed no evidence of reversible ischemia with EF of 49%.  The patient will be discharged home on above medications, will be followed up by his PCP and GI for his hepatitis C and cirrhosis as outpatient.  The patient has been counseled regarding lifestyle changes, staying away from alcohol and compliance with medication and monitoring his blood sugar.     Allegra Lai. Terrence Dupont, M.D.     MNH/MEDQ  D:  02/26/2014  T:  02/26/2014  Job:  [id number]

## 2014-02-26 NOTE — Discharge Instructions (Signed)

## 2014-02-26 NOTE — Discharge Summary (Signed)
Discharge summary dictated on 02/26/2014 dictation number is [phone]

## 2014-03-03 ENCOUNTER — Encounter: Payer: Self-pay | Admitting: Cardiology

## 2014-03-06 ENCOUNTER — Encounter: Payer: Self-pay | Admitting: Internal Medicine

## 2014-04-20 ENCOUNTER — Encounter (HOSPITAL_COMMUNITY): Payer: Self-pay | Admitting: Neurology

## 2014-04-20 ENCOUNTER — Emergency Department (HOSPITAL_COMMUNITY): Payer: Medicaid Other

## 2014-04-20 ENCOUNTER — Emergency Department (HOSPITAL_COMMUNITY)
Admission: EM | Admit: 2014-04-20 | Discharge: 2014-04-20 | Disposition: A | Discharge status: Home / Self Care | Payer: Medicaid Other | Attending: Emergency Medicine | Admitting: Emergency Medicine

## 2014-04-20 DIAGNOSIS — R21 Rash and other nonspecific skin eruption: Secondary | ICD-10-CM | POA: Diagnosis not present

## 2014-04-20 DIAGNOSIS — Z79899 Other long term (current) drug therapy: Secondary | ICD-10-CM | POA: Insufficient documentation

## 2014-04-20 DIAGNOSIS — Z951 Presence of aortocoronary bypass graft: Secondary | ICD-10-CM | POA: Insufficient documentation

## 2014-04-20 DIAGNOSIS — R2243 Localized swelling, mass and lump, lower limb, bilateral: Secondary | ICD-10-CM | POA: Diagnosis not present

## 2014-04-20 DIAGNOSIS — E669 Obesity, unspecified: Secondary | ICD-10-CM | POA: Insufficient documentation

## 2014-04-20 DIAGNOSIS — R600 Localized edema: Secondary | ICD-10-CM

## 2014-04-20 DIAGNOSIS — R079 Chest pain, unspecified: Secondary | ICD-10-CM | POA: Insufficient documentation

## 2014-04-20 DIAGNOSIS — Z9889 Other specified postprocedural states: Secondary | ICD-10-CM | POA: Diagnosis not present

## 2014-04-20 DIAGNOSIS — E119 Type 2 diabetes mellitus without complications: Secondary | ICD-10-CM | POA: Insufficient documentation

## 2014-04-20 DIAGNOSIS — R0602 Shortness of breath: Secondary | ICD-10-CM | POA: Insufficient documentation

## 2014-04-20 DIAGNOSIS — Z794 Long term (current) use of insulin: Secondary | ICD-10-CM | POA: Insufficient documentation

## 2014-04-20 DIAGNOSIS — Z7982 Long term (current) use of aspirin: Secondary | ICD-10-CM | POA: Insufficient documentation

## 2014-04-20 DIAGNOSIS — I10 Essential (primary) hypertension: Secondary | ICD-10-CM | POA: Insufficient documentation

## 2014-04-20 DIAGNOSIS — Z95810 Presence of automatic (implantable) cardiac defibrillator: Secondary | ICD-10-CM | POA: Insufficient documentation

## 2014-04-20 LAB — COMPREHENSIVE METABOLIC PANEL WITH GFR
ALT: 39 U/L (ref 0–53)
AST: 78 U/L — ABNORMAL HIGH (ref 0–37)
Albumin: 2 g/dL — ABNORMAL LOW (ref 3.5–5.2)
Alkaline Phosphatase: 64 U/L (ref 39–117)
Anion gap: 5 (ref 5–15)
BUN: 29 mg/dL — ABNORMAL HIGH (ref 6–23)
CO2: 23 mmol/L (ref 19–32)
Calcium: 8.3 mg/dL — ABNORMAL LOW (ref 8.4–10.5)
Chloride: 108 mmol/L (ref 96–112)
Creatinine, Ser: 2.18 mg/dL — ABNORMAL HIGH (ref 0.50–1.35)
GFR calc Af Amer: 36 mL/min — ABNORMAL LOW
GFR calc non Af Amer: 31 mL/min — ABNORMAL LOW
Glucose, Bld: 124 mg/dL — ABNORMAL HIGH (ref 70–99)
Potassium: 6 mmol/L — ABNORMAL HIGH (ref 3.5–5.1)
Sodium: 136 mmol/L (ref 135–145)
Total Bilirubin: 2.1 mg/dL — ABNORMAL HIGH (ref 0.3–1.2)
Total Protein: 6.8 g/dL (ref 6.0–8.3)

## 2014-04-20 LAB — CBC WITH DIFFERENTIAL/PLATELET
Basophils Absolute: 0 K/uL (ref 0.0–0.1)
Basophils Relative: 0 % (ref 0–1)
Eosinophils Absolute: 0.1 K/uL (ref 0.0–0.7)
Eosinophils Relative: 1 % (ref 0–5)
HCT: 32.5 % — ABNORMAL LOW (ref 39.0–52.0)
Hemoglobin: 10.9 g/dL — ABNORMAL LOW (ref 13.0–17.0)
Lymphocytes Relative: 28 % (ref 12–46)
Lymphs Abs: 1.8 K/uL (ref 0.7–4.0)
MCH: 29.9 pg (ref 26.0–34.0)
MCHC: 33.5 g/dL (ref 30.0–36.0)
MCV: 89 fL (ref 78.0–100.0)
Monocytes Absolute: 0.7 K/uL (ref 0.1–1.0)
Monocytes Relative: 10 % (ref 3–12)
Neutro Abs: 4 K/uL (ref 1.7–7.7)
Neutrophils Relative %: 61 % (ref 43–77)
Platelets: 129 K/uL — ABNORMAL LOW (ref 150–400)
RBC: 3.65 MIL/uL — ABNORMAL LOW (ref 4.22–5.81)
RDW: 12.8 % (ref 11.5–15.5)
WBC: 6.6 K/uL (ref 4.0–10.5)

## 2014-04-20 LAB — BRAIN NATRIURETIC PEPTIDE: B NATRIURETIC PEPTIDE 5: 125.3 pg/mL — AB (ref 0.0–100.0)

## 2014-04-20 LAB — I-STAT TROPONIN, ED: TROPONIN I, POC: 0.03 ng/mL (ref 0.00–0.08)

## 2014-04-20 LAB — POTASSIUM: Potassium: 4 mmol/L (ref 3.5–5.1)

## 2014-04-20 LAB — CBG MONITORING, ED: Glucose-Capillary: 92 mg/dL (ref 70–99)

## 2014-04-20 MED ORDER — METOPROLOL TARTRATE 25 MG PO TABS
25.0000 mg | ORAL_TABLET | Freq: Every morning | ORAL | Status: DC
  Administered 2014-04-20: 25 mg via ORAL
  Filled 2014-04-20: qty 1

## 2014-04-20 MED ORDER — MORPHINE SULFATE 4 MG/ML IJ SOLN
4.0000 mg | Freq: Once | INTRAMUSCULAR | Status: AC
  Administered 2014-04-20: 4 mg via INTRAVENOUS
  Filled 2014-04-20: qty 1

## 2014-04-20 MED ORDER — NITROGLYCERIN 0.4 MG SL SUBL
0.4000 mg | SUBLINGUAL_TABLET | SUBLINGUAL | Status: DC | PRN
Start: 2014-04-20 — End: 2014-04-20
  Administered 2014-04-20 (×2): 0.4 mg via SUBLINGUAL
  Filled 2014-04-20 (×2): qty 1

## 2014-04-20 MED ORDER — AMLODIPINE BESYLATE 5 MG PO TABS
10.0000 mg | ORAL_TABLET | Freq: Every day | ORAL | Status: DC
  Administered 2014-04-20: 10 mg via ORAL
  Filled 2014-04-20: qty 2

## 2014-04-20 MED ORDER — ASPIRIN 81 MG PO CHEW
324.0000 mg | CHEWABLE_TABLET | Freq: Once | ORAL | Status: AC
  Administered 2014-04-20: 324 mg via ORAL
  Filled 2014-04-20: qty 4

## 2014-04-20 MED ORDER — FUROSEMIDE 20 MG PO TABS
40.0000 mg | ORAL_TABLET | Freq: Every day | ORAL | Status: DC
  Administered 2014-04-20: 40 mg via ORAL
  Filled 2014-04-20: qty 2

## 2014-04-20 NOTE — ED Provider Notes (Signed)
CSN: [id number]     Arrival date & time 04/20/14  [id number] History   First MD Initiated Contact with Patient 04/20/14 [phone]     Chief Complaint  Patient presents with  . Leg Swelling     (Consider location/radiation/quality/duration/timing/severity/associated sxs/prior Treatment) HPI Comments: Patient with past medical history of hypertension, hyperlipidemia, diabetes, and CABG presents to the emergency department with chief complaint of chest pain, shortness of breath, lower extremity swelling, and rash. Patient states the symptoms have been progressively worsening for the past week. He states that it is painful to walk because of the swelling in his legs. He also reports exertional shortness of breath. Patient states that he has not taken his blood pressure medicine this morning. He also complains of a rash on his lower and upper extremities, which he describes as itchy. He denies any fevers or chills. His cardiologist is Dr. Terrence Dupont.   The history is provided by the patient. No language interpreter was used.    Past Medical History  Diagnosis Date  . Hypertension   . Hyperlipemia   . 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 Edema   . 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 Obesity   . Ventricular tachycardia    Past Surgical History  Procedure Laterality Date  . Coronary artery bypass graft    . Implantable cardioverter defibrillator implant  03/24/2013    STJ single chamber ICD implanted by Dr Caryl Comes for secondary prevention  . Left heart catheterization with coronary/graft angiogram  03/07/2013    Procedure: LEFT HEART CATHETERIZATION WITH [name];  Surgeon: Clent Demark, MD;  Location: [facility] CATH LAB;  Service: Cardiovascular;;  . Implantable cardioverter defibrillator implant N/A 03/24/2013    Procedure: IMPLANTABLE CARDIOVERTER DEFIBRILLATOR IMPLANT;  Surgeon: Deboraha Sprang, MD;  Location: [facility] CATH LAB;  Service: Cardiovascular;   Laterality: N/A;   Family History  Problem Relation Age of Onset  . Heart disease Mother   . Diabetes Mother    History  Substance Use Topics  . Smoking status: Never Smoker   . Smokeless tobacco: Not on file  . Alcohol Use: No    Review of Systems  Constitutional: Negative for fever and chills.  Respiratory: Positive for shortness of breath.   Cardiovascular: Positive for chest pain and leg swelling.  Gastrointestinal: Negative for nausea, vomiting, diarrhea and constipation.  Genitourinary: Negative for dysuria.  Skin: Positive for rash.  All other systems reviewed and are negative.     Allergies  Review of patient's allergies indicates no known allergies.  Home Medications   Prior to Admission medications   Medication Sig Start Date End Date Taking? Authorizing Provider  amLODipine (NORVASC) 10 MG tablet Take 10 mg by mouth daily.    Historical Provider, MD  aspirin EC 81 MG tablet Take 81 mg by mouth daily.    Historical Provider, MD  furosemide (LASIX) 40 MG tablet Take 1 tablet (40 mg total) by mouth daily. 11/28/13   Domenic Polite, MD  glipiZIDE (GLUCOTROL) 5 MG tablet Take 5 mg by mouth daily before breakfast.    Historical Provider, MD  isosorbide mononitrate (IMDUR) 60 MG 24 hr tablet Take 1 tablet (60 mg total) by mouth daily. 03/25/13   Ripudeep Krystal Eaton, MD  levofloxacin (LEVAQUIN) 250 MG tablet Take 1 tablet (250 mg total) by mouth daily. 02/26/14   Charolette Forward, MD  metoprolol tartrate (LOPRESSOR) 25 MG tablet Take 25 mg by mouth every  morning.     Historical Provider, MD  potassium chloride (K-DUR,KLOR-CON) 10 MEQ tablet Take 10 mEq by mouth 2 (two) times daily.    Historical Provider, MD   BP 217/117 mmHg  Pulse 88  Temp(Src) 97.6 F (36.4 C) (Oral)  Resp 14  Ht 5\' 6"  (1.676 m)  Wt 188 lb (85.276 kg)  BMI 30.36 kg/m2  SpO2 98% Physical Exam  Constitutional: He is oriented to person, place, and time. He appears well-developed and well-nourished.  HENT:   Head: Normocephalic and atraumatic.  Eyes: Conjunctivae and EOM are normal. Pupils are equal, round, and reactive to light. Right eye exhibits no discharge. Left eye exhibits no discharge. No scleral icterus.  Neck: Normal range of motion. Neck supple. No JVD present.  Cardiovascular: Normal rate, regular rhythm, normal heart sounds and intact distal pulses.  Exam reveals no gallop and no friction rub.   No murmur heard. Pulmonary/Chest: Effort normal and breath sounds normal. No respiratory distress. He has no wheezes. He has no rales. He exhibits no tenderness.  Abdominal: Soft. He exhibits no distension and no mass. There is no tenderness. There is no rebound and no guarding.  Musculoskeletal: Normal range of motion. He exhibits edema. He exhibits no tenderness.  2+ pitting edema of lower extremities  Neurological: He is alert and oriented to person, place, and time.  Skin: Skin is warm and dry.  Scattered lesions lower extremities with excoriation, no evidence of cellulitis or abscess  Psychiatric: He has a normal mood and affect. His behavior is normal. Judgment and thought content normal.  Nursing note and vitals reviewed.   ED Course  Procedures (including critical care time) Results for orders placed or performed during the hospital encounter of 04/20/14  Comprehensive metabolic panel  Result Value Ref Range   Sodium 136 135 - 145 mmol/L   Potassium 6.0 (H) 3.5 - 5.1 mmol/L   Chloride 108 96 - 112 mmol/L   CO2 23 19 - 32 mmol/L   Glucose, Bld 124 (H) 70 - 99 mg/dL   BUN 29 (H) 6 - 23 mg/dL   Creatinine, Ser 2.18 (H) 0.50 - 1.35 mg/dL   Calcium 8.3 (L) 8.4 - 10.5 mg/dL   Total Protein 6.8 6.0 - 8.3 g/dL   Albumin 2.0 (L) 3.5 - 5.2 g/dL   AST 78 (H) 0 - 37 U/L   ALT 39 0 - 53 U/L   Alkaline Phosphatase 64 39 - 117 U/L   Total Bilirubin 2.1 (H) 0.3 - 1.2 mg/dL   GFR calc non Af Amer 31 (L) >90 mL/min   GFR calc Af Amer 36 (L) >90 mL/min   Anion gap 5 5 - 15  CBC with  Differential/Platelet  Result Value Ref Range   WBC 6.6 4.0 - 10.5 K/uL   RBC 3.65 (L) 4.22 - 5.81 MIL/uL   Hemoglobin 10.9 (L) 13.0 - 17.0 g/dL   HCT 32.5 (L) 39.0 - 52.0 %   MCV 89.0 78.0 - 100.0 fL   MCH 29.9 26.0 - 34.0 pg   MCHC 33.5 30.0 - 36.0 g/dL   RDW 12.8 11.5 - 15.5 %   Platelets 129 (L) 150 - 400 K/uL   Neutrophils Relative % 61 43 - 77 %   Neutro Abs 4.0 1.7 - 7.7 K/uL   Lymphocytes Relative 28 12 - 46 %   Lymphs Abs 1.8 0.7 - 4.0 K/uL   Monocytes Relative 10 3 - 12 %   Monocytes Absolute 0.7 0.1 -  1.0 K/uL   Eosinophils Relative 1 0 - 5 %   Eosinophils Absolute 0.1 0.0 - 0.7 K/uL   Basophils Relative 0 0 - 1 %   Basophils Absolute 0.0 0.0 - 0.1 K/uL  Potassium  Result Value Ref Range   Potassium 4.0 3.5 - 5.1 mmol/L  Brain natriuretic peptide  Result Value Ref Range   B Natriuretic Peptide 125.3 (H) 0.0 - 100.0 pg/mL  I-stat troponin, ED  Result Value Ref Range   Troponin i, poc 0.03 0.00 - 0.08 ng/mL   Comment 3          CBG monitoring, ED  Result Value Ref Range   Glucose-Capillary 92 70 - 99 mg/dL   Dg Chest Port 1 View  04/20/2014   CLINICAL DATA:  Bilateral leg swell, tightness in the chest for 2 days  EXAM: PORTABLE CHEST - 1 VIEW  COMPARISON:  02/25/2014  FINDINGS: There is no focal parenchymal opacity, pleural effusion, or pneumothorax. The heart mediastinum are stable. There is a single lead cardiac pacer. There is evidence of prior CABG.  The osseous structures are unremarkable.  IMPRESSION: No active disease.   Electronically Signed   By: Kathreen Devoid   On: 04/20/2014 10:23      EKG Interpretation   Date/Time:  Monday April 20 2014 09:36:13 EDT Ventricular Rate:  86 PR Interval:  143 QRS Duration: 87 QT Interval:  414 QTC Calculation: 495 R Axis:   -27 Text Interpretation:  Sinus rhythm Left atrial enlargement Borderline left  axis deviation Abnormal R-wave progression, late transition Borderline  prolonged QT interval Baseline wander in  lead(s) V3 Confirmed by ZACKOWSKI   MD, SCOTT ([id number]) on 04/20/2014 9:38:12 AM      MDM   Final diagnoses:  SOB (shortness of breath)  Bilateral edema of lower extremity   Patient with chest pain, shortness of breath, and lower extremity swelling. Patient has extensive cardiac history. Will check labs, EKG, chest x-ray, and treat symptoms. Will also give the patient has regular morning medicines, which he has not taken today.  3:32 PM Patient seen by and discussed with Dr. Rogene Houston, who agrees with workup and discharge plan.  Labs are reassuring. Initial potassium was 6, but this was thought to be hemolyzed, this was rechecked, and was found to be 4. Otherwise labs are baseline for the patient. Will discharge to home with primary care follow-up. Patient understands and agrees with the plan. He is stable and ready for discharge.    Montine Circle, PA-C 04/20/14 [phone]

## 2014-04-20 NOTE — ED Notes (Signed)
Pt is in stable condition upon d/c and ambulates from ED. 

## 2014-04-20 NOTE — ED Provider Notes (Signed)
Medical screening examination/treatment/procedure(s) were conducted as a shared visit with non-physician practitioner(s) and myself.  I personally evaluated the patient during the encounter.   The patient came in for concern of bilateral leg swelling. Patient was concerned of fluid may be developing on his lungs again. Had a little bit shortness of breath and a little bit of chest pain but none of this is out of the ordinary. Patient's already on Lasix 80 mg. Workup here shows chest x-ray negative for congestive heart failure. Potassium was originally elevated but repeat was normal. No significant anemia no significant electrolyte abnormalities. BMP up a little bit at 125. Troponin is negative. Patient's chest pain symptoms been ongoing for several days on and off.  Patient reassured that no reason for admission. Patient does have leg swellings or E taking 80 mg Lasix today as stated above. Recommend that he follow-up with his primary care doctor for close follow-up and adjustments as needed for the blood pressure and perhaps for changing fluid pill 100 to return for any worse shortness of breath or any worse chest pain.  Patient's legs are markedly swollen with edema. Patient in no respiratory distress. Lungs clear bilaterally.   EKG Interpretation   Date/Time:  Monday April 20 2014 09:36:13 EDT Ventricular Rate:  86 PR Interval:  143 QRS Duration: 87 QT Interval:  414 QTC Calculation: 495 R Axis:   -27 Text Interpretation:  Sinus rhythm Left atrial enlargement Borderline left  axis deviation Abnormal R-wave progression, late transition Borderline  prolonged QT interval Baseline wander in lead(s) V3 Confirmed by Sativa Gelles   MD, Mykah Bellomo [phone]) on 04/20/2014 9:38:12 AM      Results for orders placed or performed during the hospital encounter of 04/20/14  Comprehensive metabolic panel  Result Value Ref Range   Sodium 136 135 - 145 mmol/L   Potassium 6.0 (H) 3.5 - 5.1 mmol/L   Chloride 108  96 - 112 mmol/L   CO2 23 19 - 32 mmol/L   Glucose, Bld 124 (H) 70 - 99 mg/dL   BUN 29 (H) 6 - 23 mg/dL   Creatinine, Ser 2.18 (H) 0.50 - 1.35 mg/dL   Calcium 8.3 (L) 8.4 - 10.5 mg/dL   Total Protein 6.8 6.0 - 8.3 g/dL   Albumin 2.0 (L) 3.5 - 5.2 g/dL   AST 78 (H) 0 - 37 U/L   ALT 39 0 - 53 U/L   Alkaline Phosphatase 64 39 - 117 U/L   Total Bilirubin 2.1 (H) 0.3 - 1.2 mg/dL   GFR calc non Af Amer 31 (L) >90 mL/min   GFR calc Af Amer 36 (L) >90 mL/min   Anion gap 5 5 - 15  CBC with Differential/Platelet  Result Value Ref Range   WBC 6.6 4.0 - 10.5 K/uL   RBC 3.65 (L) 4.22 - 5.81 MIL/uL   Hemoglobin 10.9 (L) 13.0 - 17.0 g/dL   HCT 32.5 (L) 39.0 - 52.0 %   MCV 89.0 78.0 - 100.0 fL   MCH 29.9 26.0 - 34.0 pg   MCHC 33.5 30.0 - 36.0 g/dL   RDW 12.8 11.5 - 15.5 %   Platelets 129 (L) 150 - 400 K/uL   Neutrophils Relative % 61 43 - 77 %   Neutro Abs 4.0 1.7 - 7.7 K/uL   Lymphocytes Relative 28 12 - 46 %   Lymphs Abs 1.8 0.7 - 4.0 K/uL   Monocytes Relative 10 3 - 12 %   Monocytes Absolute 0.7 0.1 - 1.0 K/uL  Eosinophils Relative 1 0 - 5 %   Eosinophils Absolute 0.1 0.0 - 0.7 K/uL   Basophils Relative 0 0 - 1 %   Basophils Absolute 0.0 0.0 - 0.1 K/uL  Potassium  Result Value Ref Range   Potassium 4.0 3.5 - 5.1 mmol/L  Brain natriuretic peptide  Result Value Ref Range   B Natriuretic Peptide 125.3 (H) 0.0 - 100.0 pg/mL  I-stat troponin, ED  Result Value Ref Range   Troponin i, poc 0.03 0.00 - 0.08 ng/mL   Comment 3          CBG monitoring, ED  Result Value Ref Range   Glucose-Capillary 92 70 - 99 mg/dL   Dg Chest Port 1 View  04/20/2014   CLINICAL DATA:  Bilateral leg swell, tightness in the chest for 2 days  EXAM: PORTABLE CHEST - 1 VIEW  COMPARISON:  02/25/2014  FINDINGS: There is no focal parenchymal opacity, pleural effusion, or pneumothorax. The heart mediastinum are stable. There is a single lead cardiac pacer. There is evidence of prior CABG.  The osseous structures are  unremarkable.  IMPRESSION: No active disease.   Electronically Signed   By: Kathreen Devoid   On: 04/20/2014 10:23      Fredia Sorrow, MD 04/20/14 [phone]

## 2014-04-20 NOTE — ED Notes (Signed)
Pt reports for 1 week bilateral leg swelling and pain walking. Also rash to body that is small red/purple generalized to body.

## 2014-04-20 NOTE — ED Notes (Signed)
Pt given a [place] sandwich Per Montine Circle PA-C

## 2014-04-20 NOTE — Discharge Instructions (Signed)
Edema °Edema is an abnormal buildup of fluids in your body tissues. Edema is somewhat dependent on gravity to pull the fluid to the lowest place in your body. That makes the condition more common in the legs and thighs (lower extremities). Painless swelling of the feet and ankles is common and becomes more likely as you get older. It is also common in looser tissues, like around your eyes.  °When the affected area is squeezed, the fluid may move out of that spot and leave a dent for a few moments. This dent is called pitting.  °CAUSES  °There are many possible causes of edema. Eating too much salt and being on your feet or sitting for a long time can cause edema in your legs and ankles. Hot weather may make edema worse. Common medical causes of edema include: °· Heart failure. °· Liver disease. °· Kidney disease. °· Weak blood vessels in your legs. °· Cancer. °· An injury. °· Pregnancy. °· Some medications. °· Obesity.  °SYMPTOMS  °Edema is usually painless. Your skin may look swollen or shiny.  °DIAGNOSIS  °Your health care provider may be able to diagnose edema by asking about your medical history and doing a physical exam. You may need to have tests such as X-rays, an electrocardiogram, or blood tests to check for medical conditions that may cause edema.  °TREATMENT  °Edema treatment depends on the cause. If you have heart, liver, or kidney disease, you need the treatment appropriate for these conditions. General treatment may include: °· Elevation of the affected body part above the level of your heart. °· Compression of the affected body part. Pressure from elastic bandages or support stockings squeezes the tissues and forces fluid back into the blood vessels. This keeps fluid from entering the tissues. °· Restriction of fluid and salt intake. °· Use of a water pill (diuretic). These medications are appropriate only for some types of edema. They pull fluid out of your body and make you urinate more often. This  gets rid of fluid and reduces swelling, but diuretics can have side effects. Only use diuretics as directed by your health care provider. °HOME CARE INSTRUCTIONS  °· Keep the affected body part above the level of your heart when you are lying down.   °· Do not sit still or stand for prolonged periods.   °· Do not put anything directly under your knees when lying down. °· Do not wear constricting clothing or garters on your upper legs.   °· Exercise your legs to work the fluid back into your blood vessels. This may help the swelling go down.   °· Wear elastic bandages or support stockings to reduce ankle swelling as directed by your health care provider.   °· Eat a low-salt diet to reduce fluid if your health care provider recommends it.   °· Only take medicines as directed by your health care provider.  °SEEK MEDICAL CARE IF:  °· Your edema is not responding to treatment. °· You have heart, liver, or kidney disease and notice symptoms of edema. °· You have edema in your legs that does not improve after elevating them.   °· You have sudden and unexplained weight gain. °SEEK IMMEDIATE MEDICAL CARE IF:  °· You develop shortness of breath or chest pain.   °· You cannot breathe when you lie down. °· You develop pain, redness, or warmth in the swollen areas.   °· You have heart, liver, or kidney disease and suddenly get edema. °· You have a fever and your symptoms suddenly get worse. °MAKE SURE YOU:  °·   Understand these instructions.  Will watch your condition.  Will get help right away if you are not doing well or get worse. Document Released: 01/09/2005 Document Revised: 05/26/2013 Document Reviewed: 11/01/2012 [facility] Patient Information 2015 [place], [place]. This information is not intended to replace advice given to you by your health care provider. Make sure you discuss any questions you have with your health care provider.  Peripheral Edema You have swelling in your legs (peripheral edema). This swelling  is due to excess accumulation of salt and water in your body. Edema may be a sign of heart, kidney or liver disease, or a side effect of a medication. It may also be due to problems in the leg veins. Elevating your legs and using special support stockings may be very helpful, if the cause of the swelling is due to poor venous circulation. Avoid long periods of standing, whatever the cause. Treatment of edema depends on identifying the cause. Chips, pretzels, pickles and other salty foods should be avoided. Restricting salt in your diet is almost always needed. Water pills (diuretics) are often used to remove the excess salt and water from your body via urine. These medicines prevent the kidney from reabsorbing sodium. This increases urine flow. Diuretic treatment may also result in lowering of potassium levels in your body. Potassium supplements may be needed if you have to use diuretics daily. Daily weights can help you keep track of your progress in clearing your edema. You should call your caregiver for follow up care as recommended. SEEK IMMEDIATE MEDICAL CARE IF:   You have increased swelling, pain, redness, or heat in your legs.  You develop shortness of breath, especially when lying down.  You develop chest or abdominal pain, weakness, or fainting.  You have a fever. Document Released: 02/17/2004 Document Revised: 04/03/2011 Document Reviewed: 01/27/2009 [facility] Patient Information 2015 [place], [place]. This information is not intended to replace advice given to you by your health care provider. Make sure you discuss any questions you have with your health care provider.

## 2014-04-24 ENCOUNTER — Encounter: Payer: Self-pay | Admitting: Internal Medicine

## 2014-06-12 ENCOUNTER — Encounter: Payer: Self-pay | Admitting: Internal Medicine

## 2014-06-12 ENCOUNTER — Ambulatory Visit (INDEPENDENT_AMBULATORY_CARE_PROVIDER_SITE_OTHER): Payer: Medicaid Other | Admitting: Internal Medicine

## 2014-06-12 VITALS — BP 158/96 | HR 88 | Ht 66.0 in | Wt 192.0 lb

## 2014-06-12 DIAGNOSIS — I255 Ischemic cardiomyopathy: Secondary | ICD-10-CM

## 2014-06-12 DIAGNOSIS — I469 Cardiac arrest, cause unspecified: Secondary | ICD-10-CM | POA: Diagnosis not present

## 2014-06-12 DIAGNOSIS — Z4502 Encounter for adjustment and management of automatic implantable cardiac defibrillator: Secondary | ICD-10-CM | POA: Diagnosis not present

## 2014-06-12 DIAGNOSIS — I10 Essential (primary) hypertension: Secondary | ICD-10-CM

## 2014-06-12 LAB — CUP PACEART INCLINIC DEVICE CHECK
HighPow Impedance: 58.5 Ohm
Lead Channel Pacing Threshold Amplitude: 0.75 V
Lead Channel Pacing Threshold Amplitude: 0.75 V
Lead Channel Pacing Threshold Pulse Width: 0.5 ms
Lead Channel Pacing Threshold Pulse Width: 0.5 ms
Lead Channel Setting Pacing Pulse Width: 0.5 ms
Lead Channel Setting Sensing Sensitivity: 0.5 mV
MDC IDC MSMT BATTERY REMAINING LONGEVITY: 91.2 mo
MDC IDC MSMT LEADCHNL RV IMPEDANCE VALUE: 362.5 Ohm
MDC IDC MSMT LEADCHNL RV SENSING INTR AMPL: 11.8 mV
MDC IDC PG SERIAL: 7175540
MDC IDC SESS DTM: 20160520153032
MDC IDC SET LEADCHNL RV PACING AMPLITUDE: 2.5 V
MDC IDC SET ZONE DETECTION INTERVAL: 300 ms
MDC IDC SET ZONE DETECTION INTERVAL: 375 ms
MDC IDC STAT BRADY RV PERCENT PACED: 0 %
Zone Setting Detection Interval: 250 ms

## 2014-06-12 MED ORDER — TORSEMIDE 20 MG PO TABS: 40.0000 mg | ORAL_TABLET | Freq: Two times a day (BID) | ORAL | Status: DC

## 2014-06-12 MED ORDER — CLONIDINE HCL 0.1 MG PO TABS
0.1000 mg | ORAL_TABLET | Freq: Once | ORAL | Status: AC
  Administered 2014-06-12: 0.1 mg via ORAL

## 2014-06-12 MED ORDER — HYDRALAZINE HCL 50 MG PO TABS: 50.0000 mg | ORAL_TABLET | Freq: Two times a day (BID) | ORAL | Status: DC

## 2014-06-12 MED ORDER — LABETALOL HCL 200 MG PO TABS: 100.0000 mg | ORAL_TABLET | Freq: Two times a day (BID) | ORAL | Status: DC

## 2014-06-12 NOTE — Progress Notes (Signed)
Electrophysiology Office Note   Date:  06/12/2014   ID:  Burk Ragno, DOB 1956/01/15, MRN [id number]  PCP:  Eric Perna, NP  Cardiologist: [facility] Primary Electrophysiologist:    Eric Axe, MD    Chief Complaint  Patient presents with  . Pacemaker Check     History of Present Illness: Eric [name] is a 59 y.o. male is    seen in follow-up for I  TheCD implanted for aborted cardiac arrest 3/15.  He has a history of coronary artery disease with prior bypass surgery. Catheterization at the time of his arrest demonstrated patent grafts. Echocardiogram at that time demonstrated interestingly normal LV function.  He has had multiple hospitalizations over the last year most recently seen in the emergency room 3/16 for peripheral edema. He saw [facility]. [facility] just a couple of weeks ago.  Echocardiogram 11/15 demonstrated EF of 40-45%; Myoview and similar ejection fraction without evidence of ischemia  Today, he  Has  shortness of breath, orthopnea, PND, and  lower extremity edema,but no  claudication, dizziness, presyncope, syncope, bleeding, or neurologic sequela. The patient is tolerating medications without difficulties and is otherwise without complaint today.   He has a history of renal insufficiency. Creatinine was 2.18 3/16.   Past Medical History  Diagnosis Date  . Hypertension   . Hyperlipemia   . 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 Edema   . 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 Obesity   . Ventricular tachycardia    Past Surgical History  Procedure Laterality Date  . Coronary artery bypass graft    . Implantable cardioverter defibrillator implant  03/24/2013    STJ single chamber ICD implanted by Dr Eric [name] for secondary prevention  . Left heart catheterization with coronary/graft angiogram  03/07/2013    Procedure: LEFT HEART CATHETERIZATION WITH [name];  Surgeon: Eric Demark, MD;  Location: [facility] CATH  LAB;  Service: Cardiovascular;;  . Implantable cardioverter defibrillator implant N/A 03/24/2013    Procedure: IMPLANTABLE CARDIOVERTER DEFIBRILLATOR IMPLANT;  Surgeon: Eric Sprang, MD;  Location: [facility] CATH LAB;  Service: Cardiovascular;  Laterality: N/A;     Current Outpatient Prescriptions  Medication Sig Dispense Refill  . amLODipine (NORVASC) 10 MG tablet Take 10 mg by mouth daily.    Marland Kitchen aspirin EC 81 MG tablet Take 81 mg by mouth daily.    . furosemide (LASIX) 80 MG tablet Take 80 mg by mouth 2 (two) times daily.    Marland Kitchen glipiZIDE (GLUCOTROL) 5 MG tablet Take 5 mg by mouth daily before breakfast.    . isosorbide mononitrate (IMDUR) 60 MG 24 hr tablet Take 1 tablet (60 mg total) by mouth daily. 30 tablet 3  . levofloxacin (LEVAQUIN) 250 MG tablet Take 1 tablet (250 mg total) by mouth daily. 7 tablet 0  . metoprolol (LOPRESSOR) 100 MG tablet Take 100 mg by mouth 2 (two) times daily.    . potassium chloride (K-DUR,KLOR-CON) 10 MEQ tablet Take 20 mEq by mouth 2 (two) times daily.      No current facility-administered medications for this visit.    Allergies:   Review of patient's allergies indicates no known allergies.   Social History:  The patient  reports that he has never smoked. He does not have any smokeless tobacco history on file. He reports that he does not drink alcohol.   Family History:  The patient's    family history includes Diabetes in his mother; Heart  disease in his mother.    ROS:  Please see the history of present illness and past medical history  Otherwise, all other systems were reviewed and were negative distal right so than the other question would be.     PHYSICAL EXAM: VS:  BP 220/130 mmHg  Pulse 88  Ht 5\' 6"  (1.676 m)  Wt 192 lb (87.091 kg)  BMI 31.00 kg/m2 , BMI Body mass index is 31 kg/(m^2). GEN: Well nourished, well developed, in no acute distress HEENT: normal Neck:  JVD flat, carotid bruits, or masses Cardiac: REGULAR RATE and RHYTHM ;   No  murmurs,  rubs,  *No* S4  Back without kyphosis; No CVAT Respiratory:  clear to auscultation bilaterally, normal work of breathing GI: soft, nontender, nondistended, + BS MS: no deformity or atrophy Extremities no clubbing cyanosis *   edema Skin: warm and dry,  device pocket is well healed without teathering Neuro:  Strength and sensation are intact Psych: euthymic mood, full affect  EKG:  EKG is ordered today. The ekg ordered today shows sinus rhythm at 88 Intervals 14/08/38 LVH by voltage  Device interrogation is reviewed today in detail.  See PaceArt for details.   Recent Labs: 11/26/2013: Pro B Natriuretic peptide (BNP) 452.5* 04/20/2014: ALT 39; B Natriuretic Peptide 125.3*; BUN 29*; Creatinine 2.18*; Hemoglobin 10.9*; Platelets 129*; Potassium 4.0; Sodium 136    Lipid Panel     Component Value Date/Time   TRIG 191* 03/10/2013 1130     Wt Readings from Last 3 Encounters:  06/12/14 192 lb (87.091 kg)  04/20/14 188 lb (85.276 kg)  02/26/14 178 lb 12.8 oz (81.103 kg)      Other studies Reviewed: Additional studies/ records that were reviewed today include: hospital ER visits and miaging studies As above       ASSESSMENT AND PLAN: Aborted cardiac arrest  Hypertensive urgency  Implantable defibrillator St Jude   Renal insufficiency  Coronary artery disease with prior bypass  Myoview 11/15 without ischemia  No intercurrent Ventricular tachycardia  Without symptoms of ischemia  He is significantly volume overloaded in the context of his renal insufficiency and his diuretics. His blood pressure is poorly controlled. We will change his furosemide to torsemide.  Will use clonidine to suppress the BPressure   Will begin hydralazine 50 BID And change metoprolol to labetolol to 200 bid  Will change furosemide to torsemide 40 bid           Current medicines are reviewed at length with the patient today.   The patient does not have concerns regarding his medicines.   The following changes were made today:  As above    Labs/ tests ordered today include:   No orders of the defined types were placed in this encounter.     Disposition:   FU with  AS  device clinic 1 year(s)  Signed, Eric Axe, MD  06/12/2014 2:06 PM     [facility] [address] [place] [address] [phone] (office) [phone] (fax)

## 2014-06-12 NOTE — Patient Instructions (Addendum)
Medication Instructions:  Your physician has recommended you make the following change in your medication:  1) STOP Metoprolol Tartrate 2) START Labetalol 200 mg twice daily 3) STOP Furosemide 4) START Torsemide 40 mg twice daily 5) START Hydralazine 50 mg twice daily  Labwork: None ordered  Testing/Procedures: None ordered  Follow-Up: Your physician recommends that you schedule a follow-up appointment in: 2 weeks with Dr. Terrence Dupont.  Remote monitoring is used to monitor your Pacemaker of ICD from home. This monitoring reduces the number of office visits required to check your device to one time per year. It allows [place] to keep an eye on the functioning of your device to ensure it is working properly. You are scheduled for a device check from home on 09/14/14. You may send your transmission at any time that day. If you have a wireless device, the transmission will be sent automatically. After your physician reviews your transmission, you will receive a postcard with your next transmission date.  Your physician wants you to follow-up in: 9 months with Dr. Caryl Comes.  You will receive a reminder letter in the mail two months in advance. If you don't receive a letter, please call our office to schedule the follow-up appointment.   Thank you for choosing [facility]!!

## 2014-06-24 ENCOUNTER — Encounter: Payer: Self-pay | Admitting: Internal Medicine

## 2014-06-29 ENCOUNTER — Inpatient Hospital Stay (HOSPITAL_COMMUNITY)
Admission: EM | Admit: 2014-06-29 | Discharge: 2014-07-06 | DRG: 682 | Disposition: A | Discharge status: Home / Self Care | Payer: Medicaid Other | Attending: Internal Medicine | Admitting: Internal Medicine

## 2014-06-29 ENCOUNTER — Emergency Department (HOSPITAL_BASED_OUTPATIENT_CLINIC_OR_DEPARTMENT_OTHER)
Admit: 2014-06-29 | Discharge: 2014-06-29 | Disposition: A | Discharge status: Home / Self Care | Payer: Medicaid Other | Attending: Emergency Medicine | Admitting: Emergency Medicine

## 2014-06-29 ENCOUNTER — Inpatient Hospital Stay (HOSPITAL_COMMUNITY): Payer: Medicaid Other

## 2014-06-29 ENCOUNTER — Emergency Department (HOSPITAL_COMMUNITY): Payer: Medicaid Other

## 2014-06-29 ENCOUNTER — Encounter (HOSPITAL_COMMUNITY): Payer: Self-pay | Admitting: Family Medicine

## 2014-06-29 DIAGNOSIS — E871 Hypo-osmolality and hyponatremia: Secondary | ICD-10-CM | POA: Diagnosis not present

## 2014-06-29 DIAGNOSIS — I129 Hypertensive chronic kidney disease with stage 1 through stage 4 chronic kidney disease, or unspecified chronic kidney disease: Secondary | ICD-10-CM | POA: Diagnosis present

## 2014-06-29 DIAGNOSIS — N179 Acute kidney failure, unspecified: Principal | ICD-10-CM

## 2014-06-29 DIAGNOSIS — B86 Scabies: Secondary | ICD-10-CM

## 2014-06-29 DIAGNOSIS — Z8674 Personal history of sudden cardiac arrest: Secondary | ICD-10-CM | POA: Diagnosis not present

## 2014-06-29 DIAGNOSIS — Z6830 Body mass index (BMI) 30.0-30.9, adult: Secondary | ICD-10-CM

## 2014-06-29 DIAGNOSIS — E1122 Type 2 diabetes mellitus with diabetic chronic kidney disease: Secondary | ICD-10-CM | POA: Diagnosis present

## 2014-06-29 DIAGNOSIS — R600 Localized edema: Secondary | ICD-10-CM

## 2014-06-29 DIAGNOSIS — N189 Chronic kidney disease, unspecified: Secondary | ICD-10-CM

## 2014-06-29 DIAGNOSIS — I255 Ischemic cardiomyopathy: Secondary | ICD-10-CM | POA: Diagnosis present

## 2014-06-29 DIAGNOSIS — I2781 Cor pulmonale (chronic): Secondary | ICD-10-CM | POA: Diagnosis present

## 2014-06-29 DIAGNOSIS — I469 Cardiac arrest, cause unspecified: Secondary | ICD-10-CM

## 2014-06-29 DIAGNOSIS — E1140 Type 2 diabetes mellitus with diabetic neuropathy, unspecified: Secondary | ICD-10-CM | POA: Diagnosis present

## 2014-06-29 DIAGNOSIS — N183 Chronic kidney disease, stage 3 unspecified: Secondary | ICD-10-CM | POA: Diagnosis present

## 2014-06-29 DIAGNOSIS — D630 Anemia in neoplastic disease: Secondary | ICD-10-CM | POA: Diagnosis not present

## 2014-06-29 DIAGNOSIS — I5043 Acute on chronic combined systolic (congestive) and diastolic (congestive) heart failure: Secondary | ICD-10-CM | POA: Diagnosis present

## 2014-06-29 DIAGNOSIS — D649 Anemia, unspecified: Secondary | ICD-10-CM | POA: Diagnosis present

## 2014-06-29 DIAGNOSIS — Z79899 Other long term (current) drug therapy: Secondary | ICD-10-CM | POA: Diagnosis not present

## 2014-06-29 DIAGNOSIS — Z7982 Long term (current) use of aspirin: Secondary | ICD-10-CM | POA: Diagnosis not present

## 2014-06-29 DIAGNOSIS — K7460 Unspecified cirrhosis of liver: Secondary | ICD-10-CM

## 2014-06-29 DIAGNOSIS — I252 Old myocardial infarction: Secondary | ICD-10-CM

## 2014-06-29 DIAGNOSIS — E1129 Type 2 diabetes mellitus with other diabetic kidney complication: Secondary | ICD-10-CM | POA: Diagnosis present

## 2014-06-29 DIAGNOSIS — Q600 Renal agenesis, unilateral: Secondary | ICD-10-CM

## 2014-06-29 DIAGNOSIS — R21 Rash and other nonspecific skin eruption: Secondary | ICD-10-CM

## 2014-06-29 DIAGNOSIS — K7469 Other cirrhosis of liver: Secondary | ICD-10-CM | POA: Diagnosis not present

## 2014-06-29 DIAGNOSIS — C9000 Multiple myeloma not having achieved remission: Secondary | ICD-10-CM | POA: Diagnosis not present

## 2014-06-29 DIAGNOSIS — Z951 Presence of aortocoronary bypass graft: Secondary | ICD-10-CM | POA: Diagnosis not present

## 2014-06-29 DIAGNOSIS — Z4502 Encounter for adjustment and management of automatic implantable cardiac defibrillator: Secondary | ICD-10-CM

## 2014-06-29 DIAGNOSIS — Z87891 Personal history of nicotine dependence: Secondary | ICD-10-CM

## 2014-06-29 DIAGNOSIS — E785 Hyperlipidemia, unspecified: Secondary | ICD-10-CM | POA: Diagnosis present

## 2014-06-29 DIAGNOSIS — Z95810 Presence of automatic (implantable) cardiac defibrillator: Secondary | ICD-10-CM

## 2014-06-29 DIAGNOSIS — B1920 Unspecified viral hepatitis C without hepatic coma: Secondary | ICD-10-CM | POA: Diagnosis present

## 2014-06-29 DIAGNOSIS — D472 Monoclonal gammopathy: Secondary | ICD-10-CM | POA: Diagnosis present

## 2014-06-29 DIAGNOSIS — R609 Edema, unspecified: Secondary | ICD-10-CM

## 2014-06-29 DIAGNOSIS — I10 Essential (primary) hypertension: Secondary | ICD-10-CM | POA: Diagnosis not present

## 2014-06-29 DIAGNOSIS — E669 Obesity, unspecified: Secondary | ICD-10-CM | POA: Diagnosis present

## 2014-06-29 HISTORY — DX: Heart failure, unspecified: I50.9

## 2014-06-29 HISTORY — DX: Rash and other nonspecific skin eruption: R21

## 2014-06-29 HISTORY — DX: Personal history of other medical treatment: Z92.89

## 2014-06-29 HISTORY — DX: Pneumonia, unspecified organism: J18.9

## 2014-06-29 HISTORY — DX: Type 2 diabetes mellitus without complications: E11.9

## 2014-06-29 HISTORY — DX: Acute myocardial infarction, unspecified: I21.9

## 2014-06-29 HISTORY — DX: Scabies: B86

## 2014-06-29 HISTORY — DX: Presence of automatic (implantable) cardiac defibrillator: Z95.810

## 2014-06-29 LAB — COMPREHENSIVE METABOLIC PANEL
ALBUMIN: 2.3 g/dL — AB (ref 3.5–5.0)
ALT: 25 U/L (ref 17–63)
ANION GAP: 9 (ref 5–15)
AST: 23 U/L (ref 15–41)
Alkaline Phosphatase: 62 U/L (ref 38–126)
BILIRUBIN TOTAL: 0.3 mg/dL (ref 0.3–1.2)
BUN: 68 mg/dL — AB (ref 6–20)
CALCIUM: 8.9 mg/dL (ref 8.9–10.3)
CHLORIDE: 106 mmol/L (ref 101–111)
CO2: 21 mmol/L — AB (ref 22–32)
CREATININE: 3.04 mg/dL — AB (ref 0.61–1.24)
GFR calc non Af Amer: 21 mL/min — ABNORMAL LOW (ref >60)
GFR, EST AFRICAN AMERICAN: 24 mL/min — AB (ref >60)
Glucose, Bld: 131 mg/dL — ABNORMAL HIGH (ref 65–99)
POTASSIUM: 4.6 mmol/L (ref 3.5–5.1)
SODIUM: 136 mmol/L (ref 135–145)
TOTAL PROTEIN: 8.5 g/dL — AB (ref 6.5–8.1)

## 2014-06-29 LAB — CBC WITH DIFFERENTIAL/PLATELET
BASOS PCT: 0 % (ref 0–1)
Basophils Absolute: 0 10*3/uL (ref 0.0–0.1)
EOS ABS: 0.3 10*3/uL (ref 0.0–0.7)
Eosinophils Relative: 4 % (ref 0–5)
HCT: 29.2 % — ABNORMAL LOW (ref 39.0–52.0)
Hemoglobin: 9.8 g/dL — ABNORMAL LOW (ref 13.0–17.0)
Lymphocytes Relative: 28 % (ref 12–46)
Lymphs Abs: 1.9 10*3/uL (ref 0.7–4.0)
MCH: 29.4 pg (ref 26.0–34.0)
MCHC: 33.6 g/dL (ref 30.0–36.0)
MCV: 87.7 fL (ref 78.0–100.0)
MONOS PCT: 6 % (ref 3–12)
Monocytes Absolute: 0.4 10*3/uL (ref 0.1–1.0)
NEUTROS ABS: 4.1 10*3/uL (ref 1.7–7.7)
Neutrophils Relative %: 62 % (ref 43–77)
Platelets: 187 10*3/uL (ref 150–400)
RBC: 3.33 MIL/uL — ABNORMAL LOW (ref 4.22–5.81)
RDW: 12.5 % (ref 11.5–15.5)
WBC: 6.7 10*3/uL (ref 4.0–10.5)

## 2014-06-29 LAB — CK: CK TOTAL: 294 U/L (ref 49–397)

## 2014-06-29 LAB — URINALYSIS, ROUTINE W REFLEX MICROSCOPIC
Bilirubin Urine: NEGATIVE
Glucose, UA: NEGATIVE mg/dL
Ketones, ur: NEGATIVE mg/dL
Leukocytes, UA: NEGATIVE
Nitrite: NEGATIVE
PH: 6.5 (ref 5.0–8.0)
SPECIFIC GRAVITY, URINE: 1.009 (ref 1.005–1.030)
UROBILINOGEN UA: 0.2 mg/dL (ref 0.0–1.0)

## 2014-06-29 LAB — TROPONIN I: TROPONIN I: 0.03 ng/mL (ref <0.031)

## 2014-06-29 LAB — URINE MICROSCOPIC-ADD ON

## 2014-06-29 LAB — CREATININE, URINE, RANDOM: Creatinine, Urine: 37.24 mg/dL

## 2014-06-29 LAB — ACETAMINOPHEN LEVEL

## 2014-06-29 LAB — TSH: TSH: 2.8 u[IU]/mL (ref 0.350–4.500)

## 2014-06-29 LAB — PROTIME-INR
INR: 1.3 (ref 0.00–1.49)
Prothrombin Time: 16.3 seconds — ABNORMAL HIGH (ref 11.6–15.2)

## 2014-06-29 LAB — SODIUM, URINE, RANDOM: Sodium, Ur: 50 mmol/L

## 2014-06-29 LAB — BRAIN NATRIURETIC PEPTIDE: B NATRIURETIC PEPTIDE 5: 199.9 pg/mL — AB (ref 0.0–100.0)

## 2014-06-29 MED ORDER — ONDANSETRON HCL 4 MG PO TABS: 4.0000 mg | ORAL_TABLET | Freq: Four times a day (QID) | ORAL | Status: DC | PRN

## 2014-06-29 MED ORDER — GLIPIZIDE 5 MG PO TABS
5.0000 mg | ORAL_TABLET | Freq: Every day | ORAL | Status: DC
  Administered 2014-06-30 – 2014-07-01 (×2): 5 mg via ORAL
  Filled 2014-06-29 (×3): qty 1

## 2014-06-29 MED ORDER — ASPIRIN EC 81 MG PO TBEC
81.0000 mg | DELAYED_RELEASE_TABLET | Freq: Every day | ORAL | Status: DC
  Administered 2014-06-29 – 2014-07-06 (×8): 81 mg via ORAL
  Filled 2014-06-29 (×8): qty 1

## 2014-06-29 MED ORDER — TORSEMIDE 20 MG PO TABS
40.0000 mg | ORAL_TABLET | Freq: Two times a day (BID) | ORAL | Status: DC
  Administered 2014-06-29 – 2014-07-01 (×4): 40 mg via ORAL
  Filled 2014-06-29 (×5): qty 2

## 2014-06-29 MED ORDER — SODIUM CHLORIDE 0.9 % IV BOLUS (SEPSIS): 500.0000 mL | Freq: Once | INTRAVENOUS | Status: DC

## 2014-06-29 MED ORDER — HYDRALAZINE HCL 50 MG PO TABS
50.0000 mg | ORAL_TABLET | Freq: Two times a day (BID) | ORAL | Status: DC
  Administered 2014-06-29 – 2014-07-01 (×4): 50 mg via ORAL
  Filled 2014-06-29 (×5): qty 1

## 2014-06-29 MED ORDER — SODIUM CHLORIDE 0.9 % IV BOLUS (SEPSIS): 1000.0000 mL | Freq: Once | INTRAVENOUS | Status: DC

## 2014-06-29 MED ORDER — POTASSIUM CHLORIDE CRYS ER 20 MEQ PO TBCR
20.0000 meq | EXTENDED_RELEASE_TABLET | Freq: Two times a day (BID) | ORAL | Status: DC
Start: 2014-06-29 — End: 2014-07-01
  Administered 2014-06-29 – 2014-07-01 (×4): 20 meq via ORAL
  Filled 2014-06-29 (×5): qty 1

## 2014-06-29 MED ORDER — ONDANSETRON HCL 4 MG/2ML IJ SOLN
4.0000 mg | Freq: Four times a day (QID) | INTRAMUSCULAR | Status: DC | PRN
  Administered 2014-06-30: 4 mg via INTRAVENOUS
  Filled 2014-06-29: qty 2

## 2014-06-29 MED ORDER — ISOSORBIDE MONONITRATE ER 60 MG PO TB24
60.0000 mg | ORAL_TABLET | Freq: Every day | ORAL | Status: DC
  Administered 2014-06-30 – 2014-07-06 (×7): 60 mg via ORAL
  Filled 2014-06-29 (×8): qty 1

## 2014-06-29 MED ORDER — LABETALOL HCL 200 MG PO TABS
200.0000 mg | ORAL_TABLET | Freq: Two times a day (BID) | ORAL | Status: DC
  Administered 2014-06-29 – 2014-07-06 (×13): 200 mg via ORAL
  Filled 2014-06-29 (×15): qty 1

## 2014-06-29 MED ORDER — ACETAMINOPHEN 325 MG PO TABS
650.0000 mg | ORAL_TABLET | Freq: Four times a day (QID) | ORAL | Status: DC | PRN
Start: 2014-06-29 — End: 2014-07-06

## 2014-06-29 MED ORDER — AMLODIPINE BESYLATE 10 MG PO TABS
10.0000 mg | ORAL_TABLET | Freq: Every day | ORAL | Status: DC
  Administered 2014-06-30 – 2014-07-06 (×7): 10 mg via ORAL
  Filled 2014-06-29 (×8): qty 1

## 2014-06-29 MED ORDER — SODIUM CHLORIDE 0.9 % IJ SOLN
3.0000 mL | Freq: Two times a day (BID) | INTRAMUSCULAR | Status: DC
  Administered 2014-06-29 – 2014-07-05 (×12): 3 mL via INTRAVENOUS

## 2014-06-29 MED ORDER — HYDROXYZINE HCL 25 MG PO TABS
25.0000 mg | ORAL_TABLET | Freq: Three times a day (TID) | ORAL | Status: DC | PRN
  Administered 2014-06-29 – 2014-07-04 (×2): 25 mg via ORAL
  Filled 2014-06-29 (×2): qty 1

## 2014-06-29 MED ORDER — PERMETHRIN 5 % EX CREA
TOPICAL_CREAM | Freq: Once | CUTANEOUS | Status: AC
  Administered 2014-06-29: 23:00:00 via TOPICAL
  Filled 2014-06-29: qty 60

## 2014-06-29 MED ORDER — HEPARIN SODIUM (PORCINE) 5000 UNIT/ML IJ SOLN
5000.0000 [IU] | Freq: Three times a day (TID) | INTRAMUSCULAR | Status: DC
  Administered 2014-06-29 – 2014-07-06 (×20): 5000 [IU] via SUBCUTANEOUS
  Filled 2014-06-29 (×24): qty 1

## 2014-06-29 MED ORDER — SENNOSIDES-DOCUSATE SODIUM 8.6-50 MG PO TABS: 1.0000 | ORAL_TABLET | Freq: Every evening | ORAL | Status: DC | PRN

## 2014-06-29 MED ORDER — HYDROCODONE-ACETAMINOPHEN 5-325 MG PO TABS
1.0000 | ORAL_TABLET | Freq: Four times a day (QID) | ORAL | Status: DC | PRN
  Administered 2014-06-29 – 2014-07-06 (×14): 1 via ORAL
  Filled 2014-06-29 (×15): qty 1

## 2014-06-29 MED ORDER — LORATADINE 10 MG PO TABS
10.0000 mg | ORAL_TABLET | Freq: Every day | ORAL | Status: DC
  Administered 2014-06-29 – 2014-07-06 (×8): 10 mg via ORAL
  Filled 2014-06-29 (×8): qty 1

## 2014-06-29 NOTE — ED Provider Notes (Signed)
CSN: [id number]     Arrival date & time 06/29/14  1018 History   First MD Initiated Contact with Patient 06/29/14 1225     Chief Complaint  Patient presents with  . Wound Check     (Consider location/radiation/quality/duration/timing/severity/associated sxs/prior Treatment) The history is provided by the patient. No language interpreter was used.  Eric [name] is a 59 year old male with past mental history of hypertension, hyperlipidemia, diabetes, cardiac arrest in 2015, obesity, CABG in 2015, defibrillator placement in 2015, open heart surgery in 2009 presenting to the ED with rash that has been ongoing for approximately one month. Patient reports that this rash starts out as macules resulting in significant itching. Patient reports that when he itches the scabs formed and eventually bleed, reports that at certain times sporadically the lesions will bleed. Patient reported that this is been ongoing for approximately one month. Patient reports he's been applying baby oil, Neosporin. Patient reported that he has noticed leg swelling to his lower extremities. Stated he's been having chest pain and shortness of breath is gone progressively worse for the past couple days, chest pain localized left-sided chest. Reported that he has had changes to his medications recently for his blood pressure within the past month. Denied hematochezia, melena, blood thinners, travel, sick contacts, cough, nasal congestion, fever, abdominal pain, nausea, vomiting, diarrhea, sloughing of the skin, tongue swelling, throat closing sensation, blurred vision, sudden loss of vision, neck stiffness, neck pain, neck swelling, diaphoresis, recent hiking, insect bite or tick bite. PCP Dr. Oletta Lamas Cardiologist Dr. Terrence Dupont  Past Medical History  Diagnosis Date  . Hypertension   . Hyperlipemia   . 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 Edema   . 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  Obesity   . Ventricular tachycardia    Past Surgical History  Procedure Laterality Date  . Coronary artery bypass graft    . Implantable cardioverter defibrillator implant  03/24/2013    STJ single chamber ICD implanted by Dr Caryl Comes for secondary prevention  . Left heart catheterization with coronary/graft angiogram  03/07/2013    Procedure: LEFT HEART CATHETERIZATION WITH [name];  Surgeon: Clent Demark, MD;  Location: [facility] CATH LAB;  Service: Cardiovascular;;  . Implantable cardioverter defibrillator implant N/A 03/24/2013    Procedure: IMPLANTABLE CARDIOVERTER DEFIBRILLATOR IMPLANT;  Surgeon: Deboraha Sprang, MD;  Location: [facility] CATH LAB;  Service: Cardiovascular;  Laterality: N/A;   Family History  Problem Relation Age of Onset  . Heart disease Mother   . Diabetes Mother    History  Substance Use Topics  . Smoking status: Never Smoker   . Smokeless tobacco: Not on file  . Alcohol Use: No    Review of Systems  Constitutional: Positive for chills and fatigue. Negative for fever.  Eyes: Negative for visual disturbance.  Respiratory: Positive for shortness of breath. Negative for chest tightness.   Cardiovascular: Positive for chest pain and leg swelling.  Gastrointestinal: Negative for nausea, vomiting and abdominal pain.  Musculoskeletal: Negative for back pain and neck stiffness.  Skin: Positive for rash.  Neurological: Negative for dizziness, weakness and headaches.      Allergies  Review of patient's allergies indicates no known allergies.  Home Medications   Prior to Admission medications   Medication Sig Start Date End Date Taking? Authorizing Provider  amLODipine (NORVASC) 10 MG tablet Take 10 mg by mouth daily.   Yes Historical Provider, MD  aspirin EC 81 MG  tablet Take 81 mg by mouth daily.   Yes Historical Provider, MD  glipiZIDE (GLUCOTROL) 5 MG tablet Take 5 mg by mouth daily before breakfast.   Yes Historical Provider, MD  hydrALAZINE  (APRESOLINE) 50 MG tablet Take 1 tablet (50 mg total) by mouth 2 (two) times daily. 06/12/14  Yes Deboraha Sprang, MD  isosorbide mononitrate (IMDUR) 60 MG 24 hr tablet Take 1 tablet (60 mg total) by mouth daily. 03/25/13  Yes Ripudeep Krystal Eaton, MD  labetalol (NORMODYNE) 200 MG tablet Take 0.5 tablets (100 mg total) by mouth 2 (two) times daily. Patient taking differently: Take 200 mg by mouth 2 (two) times daily.  06/12/14  Yes Deboraha Sprang, MD  potassium chloride (K-DUR,KLOR-CON) 10 MEQ tablet Take 20 mEq by mouth 2 (two) times daily.    Yes Historical Provider, MD  torsemide (DEMADEX) 20 MG tablet Take 2 tablets (40 mg total) by mouth 2 (two) times daily. 06/12/14  Yes Deboraha Sprang, MD  levofloxacin (LEVAQUIN) 250 MG tablet Take 1 tablet (250 mg total) by mouth daily. Patient not taking: Reported on 06/29/2014 02/26/14   Charolette Forward, MD   BP 191/99 mmHg  Pulse 81  Temp(Src) 98 F (36.7 C)  Resp 19  Wt 189 lb 3 oz (85.815 kg)  SpO2 99% Physical Exam  Constitutional: He is oriented to person, place, and time. He appears well-developed and well-nourished. No distress.  HENT:  Head: Normocephalic and atraumatic.  Mouth/Throat: Oropharynx is clear and moist. No oropharyngeal exudate.  Eyes: Conjunctivae and EOM are normal. Pupils are equal, round, and reactive to light. Right eye exhibits no discharge. Left eye exhibits no discharge.  Neck: Normal range of motion. Neck supple. No tracheal deviation present.  Cardiovascular: Normal rate, regular rhythm and normal heart sounds.  Exam reveals no friction rub.   No murmur heard. Pulses:      Radial pulses are 2+ on the right side, and 2+ on the left side.       Dorsalis pedis pulses are 2+ on the right side, and 2+ on the left side.  Cap refill < 3 seconds  Swelling identified to the RLE when compared to the LLE. 2+ pitting edema identified to the dorsal aspects of the feet bilaterally  Pulmonary/Chest: Effort normal and breath sounds normal. No  respiratory distress. He has no wheezes. He has no rales.  Musculoskeletal: Normal range of motion.  Full ROM to upper and lower extremities without difficulty noted, negative ataxia noted.  Lymphadenopathy:    He has no cervical adenopathy.  Neurological: He is alert and oriented to person, place, and time. No cranial nerve deficit. He exhibits normal muscle tone. Coordination normal.  Cranial nerves III-XII grossly intact Strength 5+/5+ to upper and lower extremities bilaterally with resistance applied, equal distribution noted Equal grip strength bilaterally Negative facial droop Negative slurred speech Negative aphasia Negative arm drift Patient follows commands well Patient responds to questions appropriately   Skin: Skin is warm and dry. Rash noted. He is not diaphoretic.  Scabs identified to upper and lower extremities bilaterally circumferentially with negative active drainage or bleeding noted. Scabbing identified to the middle of the back, identified to the frontal aspect of the forehead. Negative swelling or red streaks. Negative palpation of fluctuance or induration. Mild warmth upon palpation to the lower legs bilaterally. Not present on chest, abdomen.  Psychiatric: He has a normal mood and affect. His behavior is normal. Thought content normal.  Nursing note and vitals reviewed.  ED Course  Procedures (including critical care time)  Results for orders placed or performed during the hospital encounter of 06/29/14  Troponin I  Result Value Ref Range   Troponin I 0.03 <0.031 ng/mL  Brain natriuretic peptide  Result Value Ref Range   B Natriuretic Peptide 199.9 (H) 0.0 - 100.0 pg/mL  CBC with Differential/Platelet  Result Value Ref Range   WBC 6.7 4.0 - 10.5 K/uL   RBC 3.33 (L) 4.22 - 5.81 MIL/uL   Hemoglobin 9.8 (L) 13.0 - 17.0 g/dL   HCT 29.2 (L) 39.0 - 52.0 %   MCV 87.7 78.0 - 100.0 fL   MCH 29.4 26.0 - 34.0 pg   MCHC 33.6 30.0 - 36.0 g/dL   RDW 12.5 11.5 - 15.5  %   Platelets 187 150 - 400 K/uL   Neutrophils Relative % 62 43 - 77 %   Neutro Abs 4.1 1.7 - 7.7 K/uL   Lymphocytes Relative 28 12 - 46 %   Lymphs Abs 1.9 0.7 - 4.0 K/uL   Monocytes Relative 6 3 - 12 %   Monocytes Absolute 0.4 0.1 - 1.0 K/uL   Eosinophils Relative 4 0 - 5 %   Eosinophils Absolute 0.3 0.0 - 0.7 K/uL   Basophils Relative 0 0 - 1 %   Basophils Absolute 0.0 0.0 - 0.1 K/uL  Comprehensive metabolic panel  Result Value Ref Range   Sodium 136 135 - 145 mmol/L   Potassium 4.6 3.5 - 5.1 mmol/L   Chloride 106 101 - 111 mmol/L   CO2 21 (L) 22 - 32 mmol/L   Glucose, Bld 131 (H) 65 - 99 mg/dL   BUN 68 (H) 6 - 20 mg/dL   Creatinine, Ser 3.04 (H) 0.61 - 1.24 mg/dL   Calcium 8.9 8.9 - 10.3 mg/dL   Total Protein 8.5 (H) 6.5 - 8.1 g/dL   Albumin 2.3 (L) 3.5 - 5.0 g/dL   AST 23 15 - 41 U/L   ALT 25 17 - 63 U/L   Alkaline Phosphatase 62 38 - 126 U/L   Total Bilirubin 0.3 0.3 - 1.2 mg/dL   GFR calc non Af Amer 21 (L) >60 mL/min   GFR calc Af Amer 24 (L) >60 mL/min   Anion gap 9 5 - 15  Acetaminophen level  Result Value Ref Range   Acetaminophen (Tylenol), Serum <10 (L) 10 - 30 ug/mL  Protime-INR  Result Value Ref Range   Prothrombin Time 16.3 (H) 11.6 - 15.2 seconds   INR 1.30 0.00 - 1.49    Labs Review Labs Reviewed  BRAIN NATRIURETIC PEPTIDE - Abnormal; Notable for the following:    B Natriuretic Peptide 199.9 (*)    All other components within normal limits  CBC WITH DIFFERENTIAL/PLATELET - Abnormal; Notable for the following:    RBC 3.33 (*)    Hemoglobin 9.8 (*)    HCT 29.2 (*)    All other components within normal limits  COMPREHENSIVE METABOLIC PANEL - Abnormal; Notable for the following:    CO2 21 (*)    Glucose, Bld 131 (*)    BUN 68 (*)    Creatinine, Ser 3.04 (*)    Total Protein 8.5 (*)    Albumin 2.3 (*)    GFR calc non Af Amer 21 (*)    GFR calc Af Amer 24 (*)    All other components within normal limits  ACETAMINOPHEN LEVEL - Abnormal; Notable  for the following:    Acetaminophen (Tylenol), Serum <  10 (*)    All other components within normal limits  PROTIME-INR - Abnormal; Notable for the following:    Prothrombin Time 16.3 (*)    All other components within normal limits  TROPONIN I    Imaging Review Dg Chest 2 View  06/29/2014   CLINICAL DATA:  59 year old male with rash and open wounds of unknown origin  EXAM: CHEST  2 VIEW  COMPARISON:  Prior chest x-ray 04/20/2014  FINDINGS: Cardiac and mediastinal contours remain within normal limits. Patient is status post median sternotomy. Left subclavian approach single lead cardiac rhythm maintenance device. The lead projects over the right ventricle. The lungs are clear. Expiratory phase timing noted incidentally on the lateral view. No evidence of failure. No acute osseous abnormality.  IMPRESSION: Stable chest x-ray without evidence of acute cardiopulmonary process.   Electronically Signed   By: Jacqulynn Cadet M.D.   On: 06/29/2014 16:21     EKG Interpretation   Date/Time:  Monday June 29 2014 14:07:41 EDT Ventricular Rate:  81 PR Interval:  153 QRS Duration: 84 QT Interval:  420 QTC Calculation: 487 R Axis:   36 Text Interpretation:  Sinus rhythm Borderline T abnormalities, inferior  leads Borderline prolonged QT interval Otherwise no significant change  Confirmed by HARRISON  MD, FORREST ([id number]) on 06/29/2014 2:26:00 PM       VASCULAR LAB PRELIMINARY PRELIMINARY PRELIMINARY PRELIMINARY  Bilateral lower extremity venous Dopplers completed.   Preliminary report: There is no obvious evidence of DVT or SVT noted in the bilateral lower extremities.   KANADY, CANDACE, RVT 06/29/2014, 3:03 PM   4:02 PM This provider spoke with Dr. Terrence Dupont, patient's Cardiologist. Discussed labs, imaging, ED course great detail. As per physician, reported the patient was recently admitted to the hospital back in February 2016 with a normal stress test. Agree to admission regarding elevated  creatinine and leg swelling. Reported that if needed, will be more than happy to see patient.   4:54 PM This provider spoke with Dr. Conley Canal, [facility] - discussed case in great detail. Discussed concern regarding increase in creatinine with leg swelling noted on physical examination. Patient to be admitted under the care of [facility]. Recommended patient not to be placed on any fluids, instructed order for fluids to be discontinued.   MDM   Final diagnoses:  AKI (acute kidney injury)  Peripheral edema  Rash and nonspecific skin eruption    Medications - No data to display  Filed Vitals:   06/29/14 1415 06/29/14 1430 06/29/14 1600 06/29/14 1601  BP: 174/87 167/100 191/99 191/99  Pulse: 80 80  81  Temp:      Resp: 17 14  19   Weight:      SpO2: 98% 99%  99%   EKG noted normal sinus rhythm with borderline T abnormalities with a borderline QT interval no cervical change, heart rate 81 bpm. Troponin 0.03. BNP mildly elevated at 199.9.  PT mildly elevated 16.3, INR 1.30. CBC negative elevated leukocytosis. Hemoglobin 9.8, hematocrit 29.2 - when compared to 2 months ago patient's hemoglobin was 10.9, hematocrit 32.5. CMP noted elevation of BUN and creatinine-BUN 68, creatinine 3.04 - when compared to 2 months ago patient's creatinine was 2.18. Acetaminophen level negative elevation. Bilateral lower extremity Dopplers negative for evidence of DVT or SVT. Stable chest x-ray without evidence of acute cortical prominent process. Chest x-ray negative findings for pleural effusion, fluid or cardiomegaly. Mildly elevated BNP with peripheral edema. Patient's last echocardiogram was performed in November 2015 with  an ejection fraction of 45-50%. Cannot rule out possible beginnings of CHF. Patient's kidney function appears to be elevated, acute kidney injury identified. Rash of unknown origin identified to the skin, appears to be in scab formation-possible scabies yet cannot rule out  possible effects of kidney disorder. Discussed case in great detail with [facility]. Patient to be admitted under the care of [facility]. Discussed plan for admission with patient is in accordance to plan of care.  Jamse Mead, PA-C 06/29/14 [place], MD 06/30/14 [phone]

## 2014-06-29 NOTE — Progress Notes (Signed)
Patient arrived to floor from ED via stretcher. Transferred to bed. SR up, call bell in reach, bed alarm activated. Oriented patient to room, call bell, bed controls, patient guide, all orders reviewed with patient along with isolation precautions with verbal understanding. Denies pain or problems at this time. Advised would be back shortly to assess with verbal understanding. Will monitor.

## 2014-06-29 NOTE — ED Notes (Signed)
Pt here for blisters all over body and BLE swelling and pain with walking.

## 2014-06-29 NOTE — ED Notes (Signed)
Pt off unit, to transport when pt returns from [place]

## 2014-06-29 NOTE — Progress Notes (Signed)
[facility] History and Physical  Jeanette Difranco [id number] DOB: [birth year] DOA: 06/29/2014  Referring physician:[place] PCP: [name], NP   Chief Complaint rash, leg swelling and paoin  HPI: Eric [name] is a 59 y.o. male with h/o CKD, DM, AICD, cirrhosis, ischemic cardiomyopathy presentes with 1. Pruritic rash on arms and legs for about a month. He has been scratching, trying alcohol, peroxide.  No one else in the house with similar. No pets. 2. Leg swelling and pain, difficulty walking.  Some dyspnea. No chest pain or palptiaions. In ED, CXR and BNP unremarkable, creatinine above 3, 2 in March.  Being admitted for edema, difficulty walking, acute renal failure. Doppler legs neg   Review of Systems:  Complete systems reviewed. As above, otherwise negative  Past Medical History  Diagnosis Date  . Hypertension   . Hyperlipemia   . 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 Edema   . 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 Obesity   . Ventricular tachycardia    Past Surgical History  Procedure Laterality Date  . Coronary artery bypass graft    . Implantable cardioverter defibrillator implant  03/24/2013    STJ single chamber ICD implanted by Dr Caryl Comes for secondary prevention  . Left heart catheterization with coronary/graft angiogram  03/07/2013    Procedure: LEFT HEART CATHETERIZATION WITH [name];  Surgeon: Clent Demark, MD;  Location: [facility] CATH LAB;  Service: Cardiovascular;;  . Implantable cardioverter defibrillator implant N/A 03/24/2013    Procedure: IMPLANTABLE CARDIOVERTER DEFIBRILLATOR IMPLANT;  Surgeon: Deboraha Sprang, MD;  Location: [facility] CATH LAB;  Service: Cardiovascular;  Laterality: N/A;   Social History:  reports that he has never smoked. He does not have any smokeless tobacco history on file. He reports that he does not drink alcohol. His drug history is not on file.  No Known  Allergies  Family History  Problem Relation Age of Onset  . Heart disease Mother   . Diabetes Mother     Prior to Admission medications   Medication Sig Start Date End Date Taking? Authorizing Provider  amLODipine (NORVASC) 10 MG tablet Take 10 mg by mouth daily.   Yes Historical Provider, MD  aspirin EC 81 MG tablet Take 81 mg by mouth daily.   Yes Historical Provider, MD  glipiZIDE (GLUCOTROL) 5 MG tablet Take 5 mg by mouth daily before breakfast.   Yes Historical Provider, MD  hydrALAZINE (APRESOLINE) 50 MG tablet Take 1 tablet (50 mg total) by mouth 2 (two) times daily. 06/12/14  Yes Deboraha Sprang, MD  isosorbide mononitrate (IMDUR) 60 MG 24 hr tablet Take 1 tablet (60 mg total) by mouth daily. 03/25/13  Yes Ripudeep Krystal Eaton, MD  labetalol (NORMODYNE) 200 MG tablet Take 0.5 tablets (100 mg total) by mouth 2 (two) times daily. Patient taking differently: Take 200 mg by mouth 2 (two) times daily.  06/12/14  Yes Deboraha Sprang, MD  potassium chloride (K-DUR,KLOR-CON) 10 MEQ tablet Take 20 mEq by mouth 2 (two) times daily.    Yes Historical Provider, MD  torsemide (DEMADEX) 20 MG tablet Take 2 tablets (40 mg total) by mouth 2 (two) times daily. 06/12/14  Yes Deboraha Sprang, MD   Physical Exam: Filed Vitals:   06/29/14 1942 06/29/14 2115 06/29/14 2117 06/29/14 2119  BP: 168/90 169/94 169/94 169/96  Pulse: 86  86 86  Temp: 98.6 F (37 C)   98.5 F (36.9 C)  TempSrc:  Oral   Oral  Resp: 16   16  Weight: 83.5 kg (184 lb 1.4 oz)     SpO2: 97%   97%    Wt Readings from Last 3 Encounters:  06/29/14 83.5 kg (184 lb 1.4 oz)  06/12/14 87.091 kg (192 lb)  04/20/14 85.276 kg (188 lb)    BP 169/96 mmHg  Pulse 86  Temp(Src) 98.5 F (36.9 C) (Oral)  Resp 16  Wt 83.5 kg (184 lb 1.4 oz)  SpO2 97%  General Appearance:    Alert, cooperative, scratching. No respiratory distress  Head:    Normocephalic, without obvious abnormality, atraumatic  Eyes:    PERRL, conjunctiva/corneas clear, EOMI      Nose:   Nares normal, septum midline, mucosa normal, no drainage   or sinus tenderness  Throat:   Lips, mucosa, and tongue normal; teeth and gums normal  Neck:   Supple, symmetrical, trachea midline, no adenopathy;       thyroid:  No enlargement/tenderness/nodules; no carotid   bruit or JVD  Back:     Symmetric, no curvature, ROM normal, no CVA tenderness  Lungs:     Clear to auscultation bilaterally, respirations unlabored  Chest wall:    No tenderness or deformity  Heart:    Regular rate and rhythm, S1 and S2 normal, no murmur, rub   or gallop  Abdomen:     Soft, non-tender, bowel sounds active all four quadrants,    no masses, no organomegaly  Genitalia:  deferred  Rectal:  deferred  Extremities:   Extremities normal, atraumatic, no cyanosis or edema  Pulses:   2+ and symmetric all extremities  Skin:   Multiple hyperpigmented and erythematous papules with excoriation, some in webspaces  Lymph nodes:   Cervical, supraclavicular, and axillary nodes normal  Neurologic:   CNII-XII intact. Normal strength, sensation and reflexes      throughout             Psych: normal affect  Labs on Admission:  Basic Metabolic Panel:  Recent Labs Lab 06/29/14 1414  NA 136  K 4.6  CL 106  CO2 21*  GLUCOSE 131*  BUN 68*  CREATININE 3.04*  CALCIUM 8.9   Liver Function Tests:  Recent Labs Lab 06/29/14 1414  AST 23  ALT 25  ALKPHOS 62  BILITOT 0.3  PROT 8.5*  ALBUMIN 2.3*   No results for input(s): LIPASE, AMYLASE in the last 168 hours. No results for input(s): AMMONIA in the last 168 hours. CBC:  Recent Labs Lab 06/29/14 1414  WBC 6.7  NEUTROABS 4.1  HGB 9.8*  HCT 29.2*  MCV 87.7  PLT 187   Cardiac Enzymes:  Recent Labs Lab 06/29/14 1414 06/29/14 2020  CKTOTAL  --  294  TROPONINI 0.03  --     BNP (last 3 results)  Recent Labs  02/23/14 0125 04/20/14 1125 06/29/14 1414  BNP 94.4 125.3* 199.9*    ProBNP (last 3 results)  Recent Labs   11/26/13 1300  PROBNP 452.5*    CBG: No results for input(s): GLUCAP in the last 168 hours.  Radiological Exams on Admission: Dg Chest 2 View  06/29/2014   CLINICAL DATA:  59 year old male with rash and open wounds of unknown origin  EXAM: CHEST  2 VIEW  COMPARISON:  Prior chest x-ray 04/20/2014  FINDINGS: Cardiac and mediastinal contours remain within normal limits. Patient is status post median sternotomy. Left subclavian approach single lead cardiac rhythm maintenance device. The lead projects over  the right ventricle. The lungs are clear. Expiratory phase timing noted incidentally on the lateral view. No evidence of failure. No acute osseous abnormality.  IMPRESSION: Stable chest x-ray without evidence of acute cardiopulmonary process.   Electronically Signed   By: Jacqulynn Cadet M.D.   On: 06/29/2014 16:21   US Abdomen Complete  06/29/2014   CLINICAL DATA:  History of cirrhosis  EXAM: ULTRASOUND ABDOMEN COMPLETE  COMPARISON:  None.  FINDINGS: Gallbladder: Well distended with multiple gallstones within. No significant gallbladder wall thickening or pericholecystic fluid is noted. A negative sonographic Percell Miller sign is seen.  Common bile duct: Diameter: 6 mm but within normal limits for the patient's given age.  Liver: Mild heterogeneity is noted.  No focal mass lesion is seen.  IVC: No abnormality visualized.  Pancreas: Visualized portion unremarkable.  Spleen: Size and appearance within normal limits.  Right Kidney: Length: 15.3 cm. Echogenicity within normal limits. No mass or hydronephrosis visualized.  Left Kidney:  Not well visualized.  This may be congenitally absent  Abdominal aorta: No aneurysm visualized.  Other findings: None.  IMPRESSION: Cholelithiasis without complicating factors.  Heterogeneity of the liver consistent with the given clinical history. No other focal abnormality is noted.  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   On:  06/29/2014 19:13    EKG: tracing reviewed. Sinus rhythm Borderline T abnormalities, inferior leads Borderline prolonged QT interval  Assessment/Plan Acute renal failure: volume overloaded on exam with 2 + edema. Admit to tele. Check UA with micro. FENa, ultrasound Active Problems: Leg edema, pain and weakness. Doppler legs negaitve. Check TSH, repeat echo. Continue torsemide.  PT eval, CPK   Hepatic cirrhosis: denies heavy EtOH. Hep c pos. Check [place]   CKD (chronic kidney disease) stage 3, GFR 30-59 ml/min   DM (diabetes mellitus), type 2 with renal complications: cont home meds   Rash and nonspecific skin eruption: looks like bug bites, possibly scabies. Permethrin, antihistamines. Contact precautions.   AICD (automatic cardioverter/defibrillator) present   Essential hypertension, benign: cont outpt meds   Code Status: full Family Communication: pt is a and o Disposition Plan: tele 3 days or so  Time spent: [place]

## 2014-06-29 NOTE — ED Notes (Signed)
PA at bedside.

## 2014-06-29 NOTE — Progress Notes (Signed)
Eric [name] with admissions notified of patient to floor and need for admission paperwork with verbal understanding.

## 2014-06-29 NOTE — Progress Notes (Signed)
VASCULAR LAB PRELIMINARY  PRELIMINARY  PRELIMINARY  PRELIMINARY  Bilateral lower extremity venous Dopplers completed.    Preliminary report:  There is no obvious evidence of DVT or SVT noted in the bilateral lower extremities.   Jacki Couse, RVT 06/29/2014, 3:03 PM

## 2014-06-30 ENCOUNTER — Inpatient Hospital Stay (HOSPITAL_COMMUNITY): Payer: Medicaid Other

## 2014-06-30 ENCOUNTER — Other Ambulatory Visit (HOSPITAL_COMMUNITY): Payer: Medicaid Other

## 2014-06-30 DIAGNOSIS — R21 Rash and other nonspecific skin eruption: Secondary | ICD-10-CM

## 2014-06-30 LAB — IRON AND TIBC
IRON: 56 ug/dL (ref 45–182)
Saturation Ratios: 23 % (ref 17.9–39.5)
TIBC: 246 ug/dL — ABNORMAL LOW (ref 250–450)
UIBC: 190 ug/dL

## 2014-06-30 LAB — GLUCOSE, CAPILLARY
GLUCOSE-CAPILLARY: 156 mg/dL — AB (ref 65–99)
GLUCOSE-CAPILLARY: 114 mg/dL — AB (ref 65–99)
Glucose-Capillary: 154 mg/dL — ABNORMAL HIGH (ref 65–99)

## 2014-06-30 LAB — HIV ANTIBODY (ROUTINE TESTING W REFLEX): HIV Screen 4th Generation wRfx: NONREACTIVE

## 2014-06-30 LAB — BASIC METABOLIC PANEL
Anion gap: 9 (ref 5–15)
BUN: 62 mg/dL — ABNORMAL HIGH (ref 6–20)
CALCIUM: 8.8 mg/dL — AB (ref 8.9–10.3)
CO2: 22 mmol/L (ref 22–32)
Chloride: 106 mmol/L (ref 101–111)
Creatinine, Ser: 2.96 mg/dL — ABNORMAL HIGH (ref 0.61–1.24)
GFR calc Af Amer: 25 mL/min — ABNORMAL LOW (ref >60)
GFR, EST NON AFRICAN AMERICAN: 22 mL/min — AB (ref >60)
GLUCOSE: 95 mg/dL (ref 65–99)
POTASSIUM: 4.5 mmol/L (ref 3.5–5.1)
Sodium: 137 mmol/L (ref 135–145)

## 2014-06-30 LAB — FERRITIN: FERRITIN: 119 ng/mL (ref 24–336)

## 2014-06-30 LAB — VITAMIN B12: Vitamin B-12: 368 pg/mL (ref 180–914)

## 2014-06-30 LAB — FOLATE: FOLATE: 18.7 ng/mL (ref >5.9)

## 2014-06-30 MED ORDER — INSULIN ASPART 100 UNIT/ML ~~LOC~~ SOLN
0.0000 [IU] | Freq: Three times a day (TID) | SUBCUTANEOUS | Status: DC
  Administered 2014-06-30: 2 [IU] via SUBCUTANEOUS
  Administered 2014-07-01: 1 [IU] via SUBCUTANEOUS
  Administered 2014-07-02: 2 [IU] via SUBCUTANEOUS
  Administered 2014-07-03 – 2014-07-06 (×5): 1 [IU] via SUBCUTANEOUS

## 2014-06-30 MED ORDER — FUROSEMIDE 10 MG/ML IJ SOLN
60.0000 mg | Freq: Once | INTRAMUSCULAR | Status: AC
  Administered 2014-06-30: 60 mg via INTRAVENOUS
  Filled 2014-06-30: qty 6

## 2014-06-30 NOTE — Progress Notes (Signed)
[facility] PROGRESS NOTE  Eric [name] [id number] DOB: May 26, 1955 DOA: 06/29/2014  PCP: [name], NP  Brief HPI: 59 year old male with a past medical history of chronic kidney disease, diabetes, AICD, ischemic cardiomyopathy, last known EF of 45-50% in November 2015, presented with complaints of a rash in his arms and legs and complains of leg swelling and pain and difficulty with walking. He was hospitalized for further management  Past medical history:  Past Medical History  Diagnosis Date  . Hypertension   . Hyperlipemia   . Edema   . 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 Obesity   . Ventricular tachycardia   . AICD (automatic cardioverter/defibrillator) present   . CHF (congestive heart failure)   . Myocardial infarction 03/2013  . Pneumonia 2016  . Type II diabetes mellitus   . History of blood transfusion 1950's    "related to pinal menigitis; HAD 16 OPERATIONS TOTAL"  . Scabies infestation 06/29/2014    Consultants: None  Procedures:  Lower extremity Doppler Negative for DVT  Antibiotics: None  Subjective: Patient feels that the itching is less in his arms and legs than yesterday. Denies any chest pain or shortness of breath. No nausea or vomiting currently. Making urine. Denies any abdominal pain.  Objective: Vital Signs  Filed Vitals:   06/29/14 2119 06/30/14 0610 06/30/14 0955 06/30/14 0957  BP: 169/96 139/117 154/83 154/83  Pulse: 86 76  74  Temp: 98.5 F (36.9 C) 98.1 F (36.7 C)    TempSrc: Oral Oral    Resp: 16 16    Height:  5\' 6"  (1.676 m)    Weight:  83.7 kg (184 lb 8.4 oz)    SpO2: 97% 97%      Intake/Output Summary (Last 24 hours) at 06/30/14 1025 Last data filed at 06/30/14 1023  Gross per 24 hour  Intake    250 ml  Output      0 ml  Net    250 ml   Filed Weights   06/29/14 1034 06/29/14 1942 06/30/14 0610  Weight: 85.815 kg (189 lb 3 oz) 83.5 kg (184 lb 1.4 oz) 83.7 kg (184 lb 8.4 oz)     General appearance: alert, cooperative, appears stated age and no distress Resp: Diminished in the bases with any crackles or wheezing. No rhonchi. Cardio: regular rate and rhythm, S1, S2 normal, no murmur, click, rub or gallop GI: soft, non-tender; bowel sounds normal; no masses,  no organomegaly Extremities: Edema present bilateral lower extremities. Skin: Multiple erythematous papules with excoriation noted all over his extremities, upper and lower. Noted in his back as well. Neurologic: No focal deficits  Lab Results:  Basic Metabolic Panel:  Recent Labs Lab 06/29/14 1414 06/30/14 0610  NA 136 137  K 4.6 4.5  CL 106 106  CO2 21* 22  GLUCOSE 131* 95  BUN 68* 62*  CREATININE 3.04* 2.96*  CALCIUM 8.9 8.8*   Liver Function Tests:  Recent Labs Lab 06/29/14 1414  AST 23  ALT 25  ALKPHOS 62  BILITOT 0.3  PROT 8.5*  ALBUMIN 2.3*   CBC:  Recent Labs Lab 06/29/14 1414  WBC 6.7  NEUTROABS 4.1  HGB 9.8*  HCT 29.2*  MCV 87.7  PLT 187   Cardiac Enzymes:  Recent Labs Lab 06/29/14 1414 06/29/14 2020  CKTOTAL  --  294  TROPONINI 0.03  --    BNP (last 3 results)  Recent Labs  02/23/14 0125 04/20/14 1125 06/29/14 1414  BNP 94.4 125.3* 199.9*    ProBNP (last 3 results)  Recent Labs  11/26/13 1300  PROBNP 452.5*    Studies/Results: Dg Chest 2 View  06/29/2014   CLINICAL DATA:  59 year old male with rash and open wounds of unknown origin  EXAM: CHEST  2 VIEW  COMPARISON:  Prior chest x-ray 04/20/2014  FINDINGS: Cardiac and mediastinal contours remain within normal limits. Patient is status post median sternotomy. Left subclavian approach single lead cardiac rhythm maintenance device. The lead projects over the right ventricle. The lungs are clear. Expiratory phase timing noted incidentally on the lateral view. No evidence of failure. No acute osseous abnormality.  IMPRESSION: Stable chest x-ray without evidence of acute cardiopulmonary process.    Electronically Signed   By: Jacqulynn Cadet M.D.   On: 06/29/2014 16:21   US Abdomen Complete  06/29/2014   CLINICAL DATA:  History of cirrhosis  EXAM: ULTRASOUND ABDOMEN COMPLETE  COMPARISON:  None.  FINDINGS: Gallbladder: Well distended with multiple gallstones within. No significant gallbladder wall thickening or pericholecystic fluid is noted. A negative sonographic Percell Miller sign is seen.  Common bile duct: Diameter: 6 mm but within normal limits for the patient's given age.  Liver: Mild heterogeneity is noted.  No focal mass lesion is seen.  IVC: No abnormality visualized.  Pancreas: Visualized portion unremarkable.  Spleen: Size and appearance within normal limits.  Right Kidney: Length: 15.3 cm. Echogenicity within normal limits. No mass or hydronephrosis visualized.  Left Kidney:  Not well visualized.  This may be congenitally absent  Abdominal aorta: No aneurysm visualized.  Other findings: None.  IMPRESSION: Cholelithiasis without complicating factors.  Heterogeneity of the liver consistent with the given clinical history. No other focal abnormality is noted.  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   On: 06/29/2014 19:13    Medications:  Scheduled: . amLODipine  10 mg Oral Daily  . aspirin EC  81 mg Oral Daily  . glipiZIDE  5 mg Oral QAC breakfast  . heparin  5,000 Units Subcutaneous 3 times per day  . hydrALAZINE  50 mg Oral BID  . insulin aspart  0-9 Units Subcutaneous TID WC  . isosorbide mononitrate  60 mg Oral Daily  . labetalol  200 mg Oral BID  . loratadine  10 mg Oral Daily  . potassium chloride  20 mEq Oral BID  . sodium chloride  3 mL Intravenous Q12H  . torsemide  40 mg Oral BID   Continuous:  [id number], HYDROcodone-acetaminophen, hydrOXYzine, ondansetron **OR** ondansetron (ZOFRAN) IV, senna-docusate  Assessment/Plan:  Active Problems:   Hepatic cirrhosis   Cirrhosis   Acute renal failure   CKD (chronic  kidney disease) stage 3, GFR 30-59 ml/min   DM (diabetes mellitus), type 2 with renal complications   Rash and nonspecific skin eruption   AICD (automatic cardioverter/defibrillator) present   Essential hypertension, benign    Acute on chronic renal failure Appears to be due to volume overload. Improving with torsemide, which will be continued. He'll be given a dose of intravenous Lasix. Monitor urine output. Repeat labs in the morning.  Lower extremity edema Likely due to volume overload. Dopplers were negative for DVT. Echocardiogram is pending.  Skin rash Thought to be due to bug bites or scabies. Permethrin has been applied. Continue to monitor.  Diabetes mellitus type 2 with renal complications Continue current medications. Sliding scale coverage will be initiated.  History of coronary artery disease/previous history of cardiac  arrest/AICD Stable. Echocardiogram is pending. He is on telemetry. PVCs noted.  History of Liver cirrhosis Compensated. LFTs are normal.  DVT Prophylaxis: Subcutaneous heparin    Code Status: Full code  Family Communication: Discussed patient  Disposition Plan: Will return home in improved.    LOS: 1 day   [facility] Pager [phone] 06/30/2014, 10:25 AM  If 7PM-7AM, please contact night-coverage at [email or web address], password [facility]

## 2014-06-30 NOTE — Progress Notes (Signed)
Pt c/o his whole body stiffening up and generalized pain 10/10. Pain was sitting in chair with arms extended out in front of him. Pt was able to stand with assist and transfer to bed, sit down, and reposition slowly. Pt given PRN pain med. Will continue to monitor.

## 2014-06-30 NOTE — Progress Notes (Signed)
Pt experiencing nausea and vomiting. PRN zofran given.

## 2014-06-30 NOTE — Care Management Note (Signed)
Case Management Note  Patient Details  Name: Eric [name] MRN: [id number] Date of Birth: [birth year]  Subjective/Objective:    Patient is from home with spouse, NCM will cont to follow for dc needs.                Action/Plan:   Expected Discharge Date:                  Expected Discharge Plan:  Home/Self Care  In-House Referral:     Discharge planning Services  CM Consult  Post Acute Care Choice:    Choice offered to:     DME Arranged:    DME Agency:     HH Arranged:    HH Agency:     Status of Service:  In process, will continue to follow  Medicare Important Message Given:  No Date Medicare IM Given:    Medicare IM give by:    Date Additional Medicare IM Given:    Additional Medicare Important Message give by:     If discussed at [place] of Stay Meetings, dates discussed:    Additional Comments:  Zenon Mayo, RN 06/30/2014, 2:36 PM

## 2014-06-30 NOTE — Evaluation (Signed)
Physical Therapy Evaluation Patient Details Name: Eric [name] MRN: [id number] DOB: [birth year] Today's Date: 06/30/2014   History of Present Illness  Pt is a 59 y/o male with a PMH of CKD, DM, AICD, ischemic cardiomyopathy (EF 45-50% in 11/2013). Pt presents with complaints of a rash in his arms and legs and complains of leg swelling and pain and difficulty walking. He was admitted for further management.   Clinical Impression  Pt admitted with above diagnosis. Pt currently with functional limitations due to the deficits listed below (see PT Problem List). At the time of PT eval pt was able to perform transfers and ambulation at a supervision to min guard assist level. Pt reports he is "completely numb all over", however with testing it appears that sensation may be diminished, but not absent as he is stating. Pt will benefit from skilled PT to increase their independence and safety with mobility to allow discharge to the venue listed below.        Follow Up Recommendations No PT follow up    Equipment Recommendations  Rolling walker with 5" wheels (Depending on progress with PT)    Recommendations for Other Services       Precautions / Restrictions Precautions Precaution Comments: Possible scabies - head and foot protection required in addition to gown/gloves.  Restrictions Weight Bearing Restrictions: No      Mobility  Bed Mobility Overal bed mobility: Needs Assistance Bed Mobility: Supine to Sit     Supine to sit: Supervision     General bed mobility comments: Supervision for safety. No physical assist required.   Transfers Overall transfer level: Needs assistance Equipment used: Rolling walker (2 wheeled) Transfers: Sit to/from Stand Sit to Stand: Min guard         General transfer comment: Close guard for safety. Pt required increased time to power up to full standing position. VC's for hand placement on seated surface for safety.    Ambulation/Gait Ambulation/Gait assistance: Min guard Ambulation Distance (Feet): 15 Feet Assistive device: Rolling walker (2 wheeled) Gait Pattern/deviations: Step-through pattern;Decreased stride length;Trunk flexed Gait velocity: Decreased Gait velocity interpretation: Below normal speed for age/gender General Gait Details: Pt was able to walk around the bed to the recliner chair with use of RW for support. Hands-on guarding was provided for safety as pt reports his legs are numb, however pt did not appear to have difficulty taking steps.  Stairs            Wheelchair Mobility    Modified Rankin (Stroke Patients Only)       Balance Overall balance assessment: Needs assistance Sitting-balance support: Feet supported;No upper extremity supported Sitting balance-Leahy Scale: Fair     Standing balance support: No upper extremity supported;During functional activity Standing balance-Leahy Scale: Fair Standing balance comment: Stood without UE support before sitting in recliner.                              Pertinent Vitals/Pain Pain Assessment: No/denies pain    Home Living Family/patient expects to be discharged to:: Private residence Living Arrangements: Spouse/significant other;Children Available Help at Discharge: Family;Available 24 hours/day Type of Home: House Home Access: Level entry     Home Layout: One level Home Equipment: None      Prior Function Level of Independence: Independent               Hand Dominance        Extremity/Trunk Assessment  Upper Extremity Assessment: Overall WFL for tasks assessed           Lower Extremity Assessment: Overall WFL for tasks assessed (Reported peripheral neuropathy bilaterally)      Cervical / Trunk Assessment: Normal  Communication   Communication: No difficulties  Cognition Arousal/Alertness: Awake/alert Behavior During Therapy: WFL for tasks assessed/performed Overall  Cognitive Status: No family/caregiver present to determine baseline cognitive functioning                      General Comments      Exercises        Assessment/Plan    PT Assessment Patient needs continued PT services  PT Diagnosis Difficulty walking   PT Problem List Decreased strength;Decreased range of motion;Decreased mobility;Decreased activity tolerance;Decreased balance;Decreased knowledge of use of DME;Decreased safety awareness;Decreased knowledge of precautions;Decreased skin integrity  PT Treatment Interventions DME instruction;Gait training;Stair training;Functional mobility training;Therapeutic activities;Therapeutic exercise;Neuromuscular re-education;Patient/family education   PT Goals (Current goals can be found in the Care Plan section) Acute Rehab PT Goals Patient Stated Goal: Pt did not state goals at this time.  PT Goal Formulation: With patient Time For Goal Achievement: 07/07/14 Potential to Achieve Goals: Good    Frequency Min 3X/week   Barriers to discharge        Co-evaluation               End of Session Equipment Utilized During Treatment: Gait belt Activity Tolerance: Patient tolerated treatment well Patient left: in chair;with call bell/phone within reach;with chair alarm set Nurse Communication: Mobility status         Time: [id number] PT Time Calculation (min) (ACUTE ONLY): 16 min   Charges:   PT Evaluation $Initial PT Evaluation Tier I: 1 Procedure     PT G CodesRolinda Roan 07-18-14, 2:32 PM   Rolinda Roan, PT, DPT Acute Rehabilitation Services Pager: [phone]

## 2014-06-30 NOTE — Progress Notes (Signed)
In room to give patient ordered Heparin. Patient c/o "numb in feet, legs, hands, everywhere". Had patient close eyes and pinched right great toe, patient winced. Did neuro assessment with no deficits noted. Will monitor.

## 2014-07-01 ENCOUNTER — Inpatient Hospital Stay (HOSPITAL_COMMUNITY): Payer: Medicaid Other

## 2014-07-01 DIAGNOSIS — K7469 Other cirrhosis of liver: Secondary | ICD-10-CM

## 2014-07-01 DIAGNOSIS — N179 Acute kidney failure, unspecified: Secondary | ICD-10-CM

## 2014-07-01 DIAGNOSIS — E1129 Type 2 diabetes mellitus with other diabetic kidney complication: Secondary | ICD-10-CM

## 2014-07-01 DIAGNOSIS — N183 Chronic kidney disease, stage 3 unspecified: Secondary | ICD-10-CM

## 2014-07-01 DIAGNOSIS — I10 Essential (primary) hypertension: Secondary | ICD-10-CM

## 2014-07-01 LAB — BASIC METABOLIC PANEL
Anion gap: 10 (ref 5–15)
BUN: 69 mg/dL — ABNORMAL HIGH (ref 6–20)
CALCIUM: 8.5 mg/dL — AB (ref 8.9–10.3)
CO2: 23 mmol/L (ref 22–32)
CREATININE: 3.68 mg/dL — AB (ref 0.61–1.24)
Chloride: 101 mmol/L (ref 101–111)
GFR calc Af Amer: 19 mL/min — ABNORMAL LOW (ref >60)
GFR calc non Af Amer: 17 mL/min — ABNORMAL LOW (ref >60)
Glucose, Bld: 80 mg/dL (ref 65–99)
Potassium: 4.8 mmol/L (ref 3.5–5.1)
SODIUM: 134 mmol/L — AB (ref 135–145)

## 2014-07-01 LAB — GLUCOSE, CAPILLARY
GLUCOSE-CAPILLARY: 122 mg/dL — AB (ref 65–99)
Glucose-Capillary: 78 mg/dL (ref 65–99)
Glucose-Capillary: 84 mg/dL (ref 65–99)
Glucose-Capillary: 143 mg/dL — ABNORMAL HIGH (ref 65–99)

## 2014-07-01 LAB — PROTEIN / CREATININE RATIO, URINE
CREATININE, URINE: 68.95 mg/dL
Protein Creatinine Ratio: 2.9 mg/mg{Cre} — ABNORMAL HIGH (ref 0.00–0.15)
Total Protein, Urine: 200 mg/dL

## 2014-07-01 LAB — CBC
HEMATOCRIT: 27.4 % — AB (ref 39.0–52.0)
Hemoglobin: 9.2 g/dL — ABNORMAL LOW (ref 13.0–17.0)
MCH: 30 pg (ref 26.0–34.0)
MCHC: 33.6 g/dL (ref 30.0–36.0)
MCV: 89.3 fL (ref 78.0–100.0)
Platelets: 183 10*3/uL (ref 150–400)
RBC: 3.07 MIL/uL — AB (ref 4.22–5.81)
RDW: 12.8 % (ref 11.5–15.5)
WBC: 6.1 10*3/uL (ref 4.0–10.5)

## 2014-07-01 MED ORDER — TORSEMIDE 20 MG PO TABS
40.0000 mg | ORAL_TABLET | Freq: Every day | ORAL | Status: DC
  Administered 2014-07-01 – 2014-07-06 (×6): 40 mg via ORAL
  Filled 2014-07-01 (×6): qty 2

## 2014-07-01 MED ORDER — HYDRALAZINE HCL 25 MG PO TABS
25.0000 mg | ORAL_TABLET | Freq: Two times a day (BID) | ORAL | Status: DC
  Administered 2014-07-01 – 2014-07-03 (×4): 25 mg via ORAL
  Filled 2014-07-01 (×5): qty 1

## 2014-07-01 NOTE — Progress Notes (Signed)
  Echocardiogram 2D Echocardiogram has been performed.  Deyton Ellenbecker FRANCES 07/01/2014, 3:51 PM

## 2014-07-01 NOTE — Consult Note (Signed)
Eric [name] is an 59 y.o. male referred by Dr Tat   Chief Complaint: Acute on CKD 3, proteinuria HPI: 58yo BM admitted 06/29/14 for rash and ARF.  Rash found to be scabies.  Has hx of DM and HTN x at least 10 yrs under ? control. Scr Was around 1 in 4/15 when he had >300mg  protein on UA.  Since then Scr has slowly increased to mid 1's in 11/15, then 2 in 2/16, 2.18 3/16 and on admission 3.04 that has subsequently increased to 3.6 today.  Albumin level has been low since at least 2/15.   UO yest 800cc.  He has developed edema in last several months.  He also has hx of Hep C but says it is not active but can't define the WU.  Renal US shows only solitary Rt kidney of 15.3cm.  Denies hx gross hematuria, stones, use of NSAID's or family hx renal ds. Says he was put on amlodipine after he had swelling in his legs.  Overall pt not a good HX. Past Medical History  Diagnosis Date  . Hypertension   . Hyperlipemia   . Edema   . 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 Obesity   . Ventricular tachycardia   . AICD (automatic cardioverter/defibrillator) present   . CHF (congestive heart failure)   . Myocardial infarction 03/2013  . Pneumonia 2016  . Type II diabetes mellitus   . History of blood transfusion 1950's    "related to pinal menigitis; HAD 16 OPERATIONS TOTAL"  . Scabies infestation 06/29/2014    Past Surgical History  Procedure Laterality Date  . Implantable cardioverter defibrillator implant  03/24/2013    STJ single chamber ICD implanted by Dr Caryl Comes for secondary prevention  . Left heart catheterization with coronary/graft angiogram  03/07/2013    Procedure: LEFT HEART CATHETERIZATION WITH [name];  Surgeon: Clent Demark, MD;  Location: [facility] CATH LAB;  Service: Cardiovascular;;  . Implantable cardioverter defibrillator implant N/A 03/24/2013    Procedure: IMPLANTABLE CARDIOVERTER DEFIBRILLATOR IMPLANT;  Surgeon: Deboraha Sprang, MD;  Location[facility] C[facility] LAB;   Service: Cardiovascular;  Laterality: N/A;  . Cardiac catheterization    . Head surgery  1950'S    "for spinal menigitis; HAD 16 OPERATIONS TOTAL"  . Back surgery  1950's    "for spinal menigitis; HAD 16 OPERATIONS TOTAL"  . 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 Coronary artery bypass graft  1999    cabg x4    Family History  Problem Relation Age of Onset  . Heart disease Mother   . Diabetes Mother   Neg for renal ds  Social History:  reports that he has quit smoking. His smoking use included Cigarettes. He has a 5 pack-year smoking history. He has never used smokeless tobacco. He reports that he does not drink alcohol or use illicit drugs. Lives with wife and daughter  Allergies: No Known Allergies  Medications Prior to Admission  Medication Sig Dispense Refill  . amLODipine (NORVASC) 10 MG tablet Take 10 mg by mouth daily.    Marland Kitchen aspirin EC 81 MG tablet Take 81 mg by mouth daily.    Marland Kitchen glipiZIDE (GLUCOTROL) 5 MG tablet Take 5 mg by mouth daily before breakfast.    . hydrALAZINE (APRESOLINE) 50 MG tablet Take 1 tablet (50 mg total) by mouth 2 (two) times daily. 60 tablet 3  . isosorbide mononitrate (IMDUR) 60 MG 24  hr tablet Take 1 tablet (60 mg total) by mouth daily. 30 tablet 3  . labetalol (NORMODYNE) 200 MG tablet Take 0.5 tablets (100 mg total) by mouth 2 (two) times daily. (Patient taking differently: Take 200 mg by mouth 2 (two) times daily. ) 60 tablet 2  . potassium chloride (K-DUR,KLOR-CON) 10 MEQ tablet Take 20 mEq by mouth 2 (two) times daily.     Marland Kitchen torsemide (DEMADEX) 20 MG tablet Take 2 tablets (40 mg total) by mouth 2 (two) times daily. 120 tablet 3     Lab Results: UA: 0-2 rbc and wbc. >300 protein   Recent Labs  06/29/14 1414 07/01/14 0724  WBC 6.7 6.1  HGB 9.8* 9.2*  HCT 29.2* 27.4*  PLT 187 183   BMET  Recent Labs  06/29/14 1414 06/30/14 0610 07/01/14 0724  NA 136 137 134*  K 4.6 4.5 4.8  CL 106 106 101   CO2 21* 22 23  GLUCOSE 131* 95 80  BUN 68* 62* 69*  CREATININE 3.04* 2.96* 3.68*  CALCIUM 8.9 8.8* 8.5*   LFT  Recent Labs  06/29/14 1414  PROT 8.5*  ALBUMIN 2.3*  AST 23  ALT 25  ALKPHOS 62  BILITOT 0.3   Dg Chest 2 View  06/29/2014   CLINICAL DATA:  59 year old male with rash and open wounds of unknown origin  EXAM: CHEST  2 VIEW  COMPARISON:  Prior chest x-ray 04/20/2014  FINDINGS: Cardiac and mediastinal contours remain within normal limits. Patient is status post median sternotomy. Left subclavian approach single lead cardiac rhythm maintenance device. The lead projects over the right ventricle. The lungs are clear. Expiratory phase timing noted incidentally on the lateral view. No evidence of failure. No acute osseous abnormality.  IMPRESSION: Stable chest x-ray without evidence of acute cardiopulmonary process.   Electronically Signed   By: Jacqulynn Cadet M.D.   On: 06/29/2014 16:21   US Abdomen Complete  06/29/2014   CLINICAL DATA:  History of cirrhosis  EXAM: ULTRASOUND ABDOMEN COMPLETE  COMPARISON:  None.  FINDINGS: Gallbladder: Well distended with multiple gallstones within. No significant gallbladder wall thickening or pericholecystic fluid is noted. A negative sonographic Percell Miller sign is seen.  Common bile duct: Diameter: 6 mm but within normal limits for the patient's given age.  Liver: Mild heterogeneity is noted.  No focal mass lesion is seen.  IVC: No abnormality visualized.  Pancreas: Visualized portion unremarkable.  Spleen: Size and appearance within normal limits.  Right Kidney: Length: 15.3 cm. Echogenicity within normal limits. No mass or hydronephrosis visualized.  Left Kidney:  Not well visualized.  This may be congenitally absent  Abdominal aorta: No aneurysm visualized.  Other findings: None.  IMPRESSION: Cholelithiasis without complicating factors.  Heterogeneity of the liver consistent with the given clinical history. No other focal abnormality is noted.  Possibly  congenitally absent left kidney   Electronically Signed   By: Inez Catalina M.D.   On: 06/29/2014 19:13    ROS: Decreased vision No SOB  No CP No Abd pain + numbness/tingling hands and feet Pruritis is better No dysuria No arthritic CO  PHYSICAL EXAM: Blood pressure 110/61, pulse 72, temperature 98.8 F (37.1 C), temperature source Oral, resp. rate 18, height 5\' 6"  (1.676 m), weight 83 kg (182 lb 15.7 oz), SpO2 99 %. HEENT: PERRLA EOMI NECK:No JVD LUNGS:clear CARDIAC:RRR wo MRG ABD:+ BS NTND No HSM EXT:2+ edema Skin: multiple healing excoriations over arms/legs back NEURO:CNI Ox3 no asterixis  Assessment: 1. Acute on  CKD 3/4 with DM/HTN in solitary Rt kidney being high on the list.  Thy ebump in his scr may very well be related to BP coming under better control.   ? If Hep C playing a role. 2. DM 3. HTN 4. Proteinuria 5.  Anemia PLAN: 1. Check HgA1c 2. Check SPEP 3. Check intact PTH 4. Agree with checking Hep C RNA by PCR 5. Quantitate protein 6. I/O 7. Resume diuretic, torsemide at 40mg  q d 8. Avoid NSAID's and pt was told this 9. Daily Scr   Damara Klunder T 07/01/2014, 2:39 PM

## 2014-07-01 NOTE — Progress Notes (Signed)
PROGRESS NOTE  Md Gory [id number] DOB: January 12, 1956 DOA: 06/29/2014 PCP: [name], NP  Assessment/Plan: Acute on chronic renal failure (CKD3) -The patient presented with serum creatinine 3.04 -Serum creatinine 1.33 on 11/26/2013 -Renal ultrasound negative for hydronephrosis -The patient has significant proteinuria or multiple previous UAs-->order urine protein/creatinine ratio -Consult nephrology -Hold torsemide today--discussed with Dr. Mercy Moore -Discontinue potassium supplementation   Lower extremity edema -suspect he has underlying nephrotic syndrome of unclear etiology presently although his hep C and DM may be playing a role -Dopplers were negative for DVT.  -11/27/2013 echocardiogram EF 45-50 percent  Skin rash Thought to be due to bug bites or scabies. Permethrin has been applied with some improvement Continue to monitor. -Appears this has been chronic as the patient has numerous excoriations that have been scarred over indicating a chronic process  Diabetes mellitus type 2 with renal complications -[id number] hemoglobin A1c 7.0 -Continue NovoLog sliding scale -Discontinue glipizide in the setting of worsening renal failure  History of coronary artery disease/previous history of cardiac arrest/AICD -Stable. Echocardiogram is pending. He is on telemetry. PVCs noted. -November 2015 Myoview negative/low risk, EF 49% -hx of VT arrest -continue ASA 81 mg  History of Liver cirrhosis -Compensated. LFTs are normal. -Hepatitis C antibody positive -Hep C RNA and PCR  Hypertension -Continue amlodipine 10 mg daily -Decrease hydralazine to 25 mg twice a day -Continue Imdur  Family Communication:   Pt at beside Disposition Plan:   Home 2-3 days pending nephrology workup      Procedures/Studies: Dg Chest 2 View  06/29/2014   CLINICAL DATA:  59 year old male with rash and open wounds of unknown origin  EXAM: CHEST  2 VIEW  COMPARISON:  Prior  chest x-ray 04/20/2014  FINDINGS: Cardiac and mediastinal contours remain within normal limits. Patient is status post median sternotomy. Left subclavian approach single lead cardiac rhythm maintenance device. The lead projects over the right ventricle. The lungs are clear. Expiratory phase timing noted incidentally on the lateral view. No evidence of failure. No acute osseous abnormality.  IMPRESSION: Stable chest x-ray without evidence of acute cardiopulmonary process.   Electronically Signed   By: Jacqulynn Cadet M.D.   On: 06/29/2014 16:21   US Abdomen Complete  06/29/2014   CLINICAL DATA:  History of cirrhosis  EXAM: ULTRASOUND ABDOMEN COMPLETE  COMPARISON:  None.  FINDINGS: Gallbladder: Well distended with multiple gallstones within. No significant gallbladder wall thickening or pericholecystic fluid is noted. A negative sonographic Percell Miller sign is seen.  Common bile duct: Diameter: 6 mm but within normal limits for the patient's given age.  Liver: Mild heterogeneity is noted.  No focal mass lesion is seen.  IVC: No abnormality visualized.  Pancreas: Visualized portion unremarkable.  Spleen: Size and appearance within normal limits.  Right Kidney: Length: 15.3 cm. Echogenicity within normal limits. No mass or hydronephrosis visualized.  Left Kidney:  Not well visualized.  This may be congenitally absent  Abdominal aorta: No aneurysm visualized.  Other findings: None.  IMPRESSION: Cholelithiasis without complicating factors.  Heterogeneity of the liver consistent with the given clinical history. No other focal abnormality is noted.  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   On: 06/29/2014 19:13         Subjective: Patient had one episode of nausea and vomiting yesterday but has not had any since then. He is eating well. Denies fevers, chills, chest pain, shortness  breath, nausea, vomiting, diarrhea, dysuria, hematuria.  Objective: Filed Vitals:    06/30/14 2245 07/01/14 0334 07/01/14 0500 07/01/14 0608  BP:  110/61    Pulse: 76 72    Temp:  98.8 F (37.1 C)    TempSrc:  Oral    Resp:  18    Height:      Weight:   83 kg (182 lb 15.7 oz) 83 kg (182 lb 15.7 oz)  SpO2:  99%      Intake/Output Summary (Last 24 hours) at 07/01/14 1326 Last data filed at 07/01/14 0923  Gross per 24 hour  Intake    240 ml  Output    800 ml  Net   -560 ml   Weight change: -2.815 kg (-6 lb 3.3 oz) Exam:   General:  Pt is alert, follows commands appropriately, not in acute distress  HEENT: No icterus, No thrush,  [place]/AT  Cardiovascular: RRR, S1/S2, no rubs, no gallops  Respiratory: CTA bilaterally, no wheezing, no crackles, no rhonchi  Abdomen: Soft/+BS, non tender, non distended, no guarding  Extremities: 2+LE edema, No lymphangitis, No petechiae, No rashes, no synovitis  Data Reviewed: Basic Metabolic Panel:  Recent Labs Lab 06/29/14 1414 06/30/14 0610 07/01/14 0724  NA 136 137 134*  K 4.6 4.5 4.8  CL 106 106 101  CO2 21* 22 23  GLUCOSE 131* 95 80  BUN 68* 62* 69*  CREATININE 3.04* 2.96* 3.68*  CALCIUM 8.9 8.8* 8.5*   Liver Function Tests:  Recent Labs Lab 06/29/14 1414  AST 23  ALT 25  ALKPHOS 62  BILITOT 0.3  PROT 8.5*  ALBUMIN 2.3*   No results for input(s): LIPASE, AMYLASE in the last 168 hours. No results for input(s): AMMONIA in the last 168 hours. CBC:  Recent Labs Lab 06/29/14 1414 07/01/14 0724  WBC 6.7 6.1  NEUTROABS 4.1  --   HGB 9.8* 9.2*  HCT 29.2* 27.4*  MCV 87.7 89.3  PLT 187 183   Cardiac Enzymes:  Recent Labs Lab 06/29/14 1414 06/29/14 2020  CKTOTAL  --  294  TROPONINI 0.03  --    BNP: Invalid input(s): POCBNP CBG:  Recent Labs Lab 06/30/14 1156 06/30/14 1708 06/30/14 2215 07/01/14 0806 07/01/14 1247  GLUCAP 156* 114* 154* 78 84    No results found for this or any previous visit (from the past 240 hour(s)).   Scheduled Meds: . amLODipine  10 mg Oral Daily  .  aspirin EC  81 mg Oral Daily  . heparin  5,000 Units Subcutaneous 3 times per day  . hydrALAZINE  25 mg Oral BID  . insulin aspart  0-9 Units Subcutaneous TID WC  . isosorbide mononitrate  60 mg Oral Daily  . labetalol  200 mg Oral BID  . loratadine  10 mg Oral Daily  . sodium chloride  3 mL Intravenous Q12H   Continuous Infusions:    Vannak Montenegro, DO  [facility] Pager [phone]  If 7PM-7AM, please contact night-coverage [email or web address] Password [email or web address] 07/01/2014, 1:26 PM   LOS: 2 days

## 2014-07-02 DIAGNOSIS — N189 Chronic kidney disease, unspecified: Secondary | ICD-10-CM

## 2014-07-02 DIAGNOSIS — Z95810 Presence of automatic (implantable) cardiac defibrillator: Secondary | ICD-10-CM

## 2014-07-02 DIAGNOSIS — E1122 Type 2 diabetes mellitus with diabetic chronic kidney disease: Secondary | ICD-10-CM

## 2014-07-02 DIAGNOSIS — I5043 Acute on chronic combined systolic (congestive) and diastolic (congestive) heart failure: Secondary | ICD-10-CM

## 2014-07-02 LAB — RENAL FUNCTION PANEL
ALBUMIN: 2.1 g/dL — AB (ref 3.5–5.0)
ANION GAP: 11 (ref 5–15)
BUN: 75 mg/dL — ABNORMAL HIGH (ref 6–20)
CALCIUM: 8.1 mg/dL — AB (ref 8.9–10.3)
CHLORIDE: 99 mmol/L — AB (ref 101–111)
CO2: 21 mmol/L — ABNORMAL LOW (ref 22–32)
CREATININE: 3.89 mg/dL — AB (ref 0.61–1.24)
GFR calc non Af Amer: 15 mL/min — ABNORMAL LOW (ref >60)
GFR, EST AFRICAN AMERICAN: 18 mL/min — AB (ref >60)
Glucose, Bld: 93 mg/dL (ref 65–99)
POTASSIUM: 4.6 mmol/L (ref 3.5–5.1)
Phosphorus: 6.4 mg/dL — ABNORMAL HIGH (ref 2.5–4.6)
SODIUM: 131 mmol/L — AB (ref 135–145)

## 2014-07-02 LAB — GLUCOSE, CAPILLARY
Glucose-Capillary: 83 mg/dL (ref 65–99)
Glucose-Capillary: 185 mg/dL — ABNORMAL HIGH (ref 65–99)
Glucose-Capillary: 142 mg/dL — ABNORMAL HIGH (ref 65–99)
Glucose-Capillary: 177 mg/dL — ABNORMAL HIGH (ref 65–99)
Glucose-Capillary: 117 mg/dL — ABNORMAL HIGH (ref 65–99)

## 2014-07-02 LAB — PROTEIN ELECTROPHORESIS, SERUM
A/G RATIO SPE: 0.5 — AB (ref 0.7–2.0)
ALBUMIN ELP: 2.3 g/dL — AB (ref 3.2–5.6)
ALPHA-2-GLOBULIN: 0.9 g/dL (ref 0.4–1.2)
Alpha-1-Globulin: 0.2 g/dL (ref 0.1–0.4)
Beta Globulin: 1.6 g/dL — ABNORMAL HIGH (ref 0.6–1.3)
Gamma Globulin: 2.2 g/dL — ABNORMAL HIGH (ref 0.5–1.6)
Globulin, Total: 5 g/dL — ABNORMAL HIGH (ref 2.0–4.5)
M-Spike, %: 0.8 g/dL — ABNORMAL HIGH
TOTAL PROTEIN ELP: 7.3 g/dL (ref 6.0–8.5)

## 2014-07-02 LAB — KAPPA/LAMBDA LIGHT CHAINS
KAPPA, LAMDA LIGHT CHAIN RATIO: 5.88 — AB (ref 0.26–1.65)
Kappa free light chain: 826.92 mg/L — ABNORMAL HIGH (ref 3.30–19.40)
LAMDA FREE LIGHT CHAINS: 140.65 mg/L — AB (ref 5.71–26.30)

## 2014-07-02 LAB — C3 COMPLEMENT: C3 COMPLEMENT: 133 mg/dL (ref 82–167)

## 2014-07-02 LAB — HEMOGLOBIN A1C
Hgb A1c MFr Bld: 6.2 % — ABNORMAL HIGH (ref 4.8–5.6)
Mean Plasma Glucose: 131 mg/dL

## 2014-07-02 LAB — C4 COMPLEMENT: Complement C4, Body Fluid: 27 mg/dL (ref 14–44)

## 2014-07-02 LAB — PARATHYROID HORMONE, INTACT (NO CA): PTH: 216 pg/mL — ABNORMAL HIGH (ref 15–65)

## 2014-07-02 NOTE — Progress Notes (Signed)
S: pruritis better.  Says he is making urine despite no UO recorded O:BP 130/76 mmHg  Pulse 73  Temp(Src) 97.8 F (36.6 C) (Oral)  Resp 18  Ht 5\' 6"  (1.676 m)  Wt 78 kg (171 lb 15.3 oz)  BMI 27.77 kg/m2  SpO2 96%  Intake/Output Summary (Last 24 hours) at 07/02/14 0856 Last data filed at 07/01/14 0923  Gross per 24 hour  Intake    240 ml  Output      0 ml  Net    240 ml   Weight change: -5 kg (-11 lb 0.4 oz) [id number] and alert CVS:RRR Resp:clear Abd:+BS NTND Ext: 1+ edema Skin: Multiple excoriations NEURO:CNi Ox3 no asterixis   . amLODipine  10 mg Oral Daily  . aspirin EC  81 mg Oral Daily  . heparin  5,000 Units Subcutaneous 3 times per day  . hydrALAZINE  25 mg Oral BID  . insulin aspart  0-9 Units Subcutaneous TID WC  . isosorbide mononitrate  60 mg Oral Daily  . labetalol  200 mg Oral BID  . loratadine  10 mg Oral Daily  . sodium chloride  3 mL Intravenous Q12H  . torsemide  40 mg Oral Daily   No results found. BMET    Component Value Date/Time   NA 131* 07/02/2014 0544   K 4.6 07/02/2014 0544   CL 99* 07/02/2014 0544   CO2 21* 07/02/2014 0544   GLUCOSE 93 07/02/2014 0544   BUN 75* 07/02/2014 0544   CREATININE 3.89* 07/02/2014 0544   CALCIUM 8.1* 07/02/2014 0544   GFRNONAA 15* 07/02/2014 0544   GFRAA 18* 07/02/2014 0544   CBC    Component Value Date/Time   WBC 6.1 07/01/2014 0724   RBC 3.07* 07/01/2014 0724   HGB 9.2* 07/01/2014 0724   HCT 27.4* 07/01/2014 0724   PLT 183 07/01/2014 0724   MCV 89.3 07/01/2014 0724   MCH 30.0 07/01/2014 0724   MCHC 33.6 07/01/2014 0724   RDW 12.8 07/01/2014 0724   LYMPHSABS 1.9 06/29/2014 1414   MONOABS 0.4 06/29/2014 1414   EOSABS 0.3 06/29/2014 1414   BASOSABS 0.0 06/29/2014 1414     Assessment:  1.  Acute on CKD 3 with chronic being from DM/HTN in solitary RT kidney and acute possible from hemodynamic perturbations from better BP control (baseline Scr around 3).  UO? Scr sl higher though rate of rise  less 2. HTN, well controlled 3. DM HgA1c 6.2 4. Anemia  May need ESA 5. Proteinuria  Pr/Cr estimates 2.9 gms  Plan: 1. Discussed with nurse keeping I/O's 2. Recheck Scr in AM   Dajanay Northrup T

## 2014-07-02 NOTE — Progress Notes (Signed)
Physical Therapy Treatment Patient Details Name: Eric [name] MRN: [id number] DOB: May 24, 1955 Today's Date: 07/02/2014    History of Present Illness Pt is a 60 y/o male with a PMH of CKD, DM, AICD, ischemic cardiomyopathy (EF 45-50% in 11/2013). Pt presents with complaints of a rash in his arms and legs and complains of leg swelling and pain and difficulty walking. He was admitted for further management.     PT Comments    Progressing towards physical therapy goals. Still reporting BIL LE pain, exacerbated by weight-bearing. Min assist to walk without an assistive device but much more stable while holding a rolling walker. States he has trouble keeping his feet elevated because of frequent spasms throughout entire LEs bilaterally. Hopeful that patient's functional ability will progress as his medical condition improves.Patient will continue to benefit from skilled physical therapy services to further improve independence with functional mobility.   Follow Up Recommendations  No PT follow up     Equipment Recommendations   (now states he has a rolling walker at home?)    Recommendations for Other Services       Precautions / Restrictions Precautions Precautions: Fall Precaution Comments: Possible scabies - head and foot protection required in addition to gown/gloves.  Restrictions Weight Bearing Restrictions: No    Mobility  Bed Mobility               General bed mobility comments: sitting in chair  Transfers Overall transfer level: Needs assistance Equipment used: Rolling walker (2 wheeled);1 person hand held assist Transfers: Sit to/from Stand Sit to Stand: Min assist         General transfer comment: Min assist for balance to stand with hand held support from recliner chair. Min guard for safety to stand from chair with RW for support. VC for hand placement.  Ambulation/Gait Ambulation/Gait assistance: Min assist Ambulation Distance (Feet): 30 Feet  (+10) Assistive device: Rolling walker (2 wheeled);1 person hand held assist Gait Pattern/deviations: Step-through pattern;Decreased stride length;Antalgic Gait velocity: Decreased   General Gait Details: Min guard for safety with a rolling walker up to 30 feet, complains of BIl foot pain. Min assist for hand held support to ambulate 10 feet, reports increased muscle spasms in bil LEs when not able to use RW for support.   Stairs            Wheelchair Mobility    Modified Rankin (Stroke Patients Only)       Balance               Standing balance comment: standing balance activities with single UE support. Pt guarded due to BIL foot pain, needs frequent rest breaks. able to march feet up and down with BIL UE support.                    Cognition Arousal/Alertness: Awake/alert Behavior During Therapy: WFL for tasks assessed/performed Overall Cognitive Status: No family/caregiver present to determine baseline cognitive functioning                      Exercises General Exercises - Lower Extremity Ankle Circles/Pumps: AROM;Both;10 reps;Seated Long Arc Quad: Strengthening;Both;5 reps;Seated Hip Flexion/Marching: Strengthening;Both;10 reps (x5 seated, x10 standing)    General Comments General comments (skin integrity, edema, etc.): Encouraged to keep feet elevated due to significant swelling of BIL LEs. Pt reports he gets BIL LE spasms when LEs are straight      Pertinent Vitals/Pain Pain Assessment: No/denies pain  Home Living                      Prior Function            PT Goals (current goals can now be found in the care plan section) Acute Rehab PT Goals PT Goal Formulation: With patient Time For Goal Achievement: 07/07/14 Potential to Achieve Goals: Good Progress towards PT goals: Progressing toward goals    Frequency  Min 3X/week    PT Plan Current plan remains appropriate    Co-evaluation             End of  Session   Activity Tolerance: Patient tolerated treatment well Patient left: in chair;with call bell/phone within reach;with family/visitor present     Time: [id number] PT Time Calculation (min) (ACUTE ONLY): 18 min  Charges:  $Gait Training: 8-22 mins                    G Codes:      Ellouise Newer 07/29/14, 5:57 PM Camille Bal Perkins, [place]

## 2014-07-02 NOTE — Progress Notes (Signed)
PROGRESS NOTE  Eric [name] [id number] DOB: Jun 17, 1955 DOA: 06/29/2014 PCP: [name], NP  Acute on chronic renal failure (CKD3) -The patient presented with serum creatinine 3.04 -Serum creatinine 1.33 on 11/26/2013 -Renal ultrasound negative for hydronephrosis, shows solitary kidney -The patient has significant proteinuria or multiple previous UAs-->order urine protein/creatinine ratio -urine protein/creatinine--2.9grams -appreciate nephrology -Continue torsemide once daily -Discontinue potassium supplementation  -C3, C4--normal -Elevated lambda and kappa light chains in serum-->?MGUS? ?ultimately need bone marrow?  Lower extremity edema/Proteinuria -?component of Cor Pulmonale -his hep C and DM may be playing a role in his proteinuria -Dopplers were negative for DVT.  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  -6/6/16Permethrin has been applied with some improvement -Appears this has been chronic as the patient has numerous excoriations that have been scarred over indicating a chronic process  Diabetes mellitus type 2 with renal complications -99991111 hemoglobin A1c 6.2 -Continue NovoLog sliding scale -Discontinue glipizide in the setting of worsening renal failure  History of coronary artery disease/previous history of cardiac arrest/AICD -No concerning dysrhythmia on telemetry. PVCs noted. -November 2015 Myoview negative/low risk, EF 49% -hx of VT arrest -continue ASA 81 mg  History of Liver cirrhosis -Compensated. LFTs are normal. -Hepatitis C antibody positive -Hep C RNA and  genotype--pending  Hypertension -Continue amlodipine 10 mg daily -Decrease hydralazine to 25 mg twice a day -Continue Imdur  Family Communication: wife updated at beside Disposition Plan: Home 2-3 days pending nephrology workup       Procedures/Studies: Dg Chest 2 View  06/29/2014   CLINICAL DATA:  59 year old male with rash and open wounds of unknown origin  EXAM: CHEST  2 VIEW  COMPARISON:  Prior chest x-ray 04/20/2014  FINDINGS: Cardiac and mediastinal contours remain within normal limits. Patient is status post median sternotomy. Left subclavian approach single lead cardiac rhythm maintenance device. The lead projects over the right ventricle. The lungs are clear. Expiratory phase timing noted incidentally on the lateral view. No evidence of failure. No acute osseous abnormality.  IMPRESSION: Stable chest x-ray without evidence of acute cardiopulmonary process.   Electronically Signed   By: Jacqulynn Cadet M.D.   On: 06/29/2014 16:21   US Abdomen Complete  06/29/2014   CLINICAL DATA:  History of cirrhosis  EXAM: ULTRASOUND ABDOMEN COMPLETE  COMPARISON:  None.  FINDINGS: Gallbladder: Well distended with multiple gallstones within. No significant gallbladder wall thickening or pericholecystic fluid is noted. A negative sonographic Percell Miller sign is seen.  Common bile duct: Diameter: 6 mm but within normal limits for the patient's given age.  Liver: Mild heterogeneity is noted.  No focal mass lesion is seen.  IVC: No abnormality visualized.  Pancreas: Visualized portion unremarkable.  Spleen: Size and appearance within normal limits.  Right Kidney: Length: 15.3 cm. Echogenicity within normal limits. No mass or hydronephrosis visualized.  Left Kidney:  Not well visualized.  This may be congenitally absent  Abdominal aorta: No aneurysm visualized.  Other findings: None.  IMPRESSION: Cholelithiasis without complicating factors.  Heterogeneity of the liver consistent with the given clinical  history. No other focal abnormality is noted.  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   On: 06/29/2014 19:13         Subjective: Patient complains of bilateral leg pain and pain all over. Denies any nausea, vomiting, diarrhea, abdominal pain, dysuria, hematuria. No hematochezia or melena.  Objective: Filed Vitals:   07/01/14 1608 07/01/14 2202 07/02/14 0631 07/02/14 1345  BP: 126/75 119/63 130/76 95/52  Pulse: 69 72 73 66  Temp: 97.9 F (36.6 C) 97.8 F (36.6 C) 97.8 F (36.6 C) 98 F (36.7 C)  TempSrc: Oral Oral Oral Oral  Resp: 18 18  20   Height:      Weight:   78 kg (171 lb 15.3 oz)   SpO2: 96% 94% 96% 96%    Intake/Output Summary (Last 24 hours) at 07/02/14 1826 Last data filed at 07/02/14 1300  Gross per 24 hour  Intake    240 ml  Output    550 ml  Net   -310 ml   Weight change: -5 kg (-11 lb 0.4 oz) Exam:   General:  Pt is alert, follows commands appropriately, not in acute distress  HEENT: No icterus, No thrush, No neck mass, [place]/AT  Cardiovascular: RRR, S1/S2, no rubs, no gallops  Respiratory: CTA bilaterally, no wheezing, no crackles, no rhonchi  Abdomen: Soft/+BS, non tender, non distended, no guarding  Extremities: No edema, No lymphangitis, No petechiae, No rashes, no synovitis  Data Reviewed: Basic Metabolic Panel:  Recent Labs Lab 06/29/14 1414 06/30/14 0610 07/01/14 0724 07/02/14 0544  NA 136 137 134* 131*  K 4.6 4.5 4.8 4.6  CL 106 106 101 99*  CO2 21* 22 23 21*  GLUCOSE 131* 95 80 93  BUN 68* 62* 69* 75*  CREATININE 3.04* 2.96* 3.68* 3.89*  CALCIUM 8.9 8.8* 8.5* 8.1*  PHOS  --   --   --  6.4*   Liver Function Tests:  Recent Labs Lab 06/29/14 1414 07/02/14 0544  AST 23  --   ALT 25  --   ALKPHOS 62  --   BILITOT 0.3  --   PROT 8.5*  --   ALBUMIN 2.3* 2.1*   No results for input(s): LIPASE, AMYLASE in the last 168 hours. No results for input(s): AMMONIA in the last 168  hours. CBC:  Recent Labs Lab 06/29/14 1414 07/01/14 0724  WBC 6.7 6.1  NEUTROABS 4.1  --   HGB 9.8* 9.2*  HCT 29.2* 27.4*  MCV 87.7 89.3  PLT 187 183   Cardiac Enzymes:  Recent Labs Lab 06/29/14 1414 06/29/14 2020  CKTOTAL  --  294  TROPONINI 0.03  --    BNP: Invalid input(s): POCBNP CBG:  Recent Labs Lab 07/01/14 2204 07/02/14 0812 07/02/14 1136 07/02/14 1331 07/02/14 1617  GLUCAP 143* 83 185* 142* 177*    No results found for this or any previous visit (from the past 240 hour(s)).   Scheduled Meds: . amLODipine  10 mg Oral Daily  . aspirin EC  81 mg Oral Daily  . heparin  5,000 Units Subcutaneous 3 times per day  . hydrALAZINE  25 mg Oral BID  . insulin aspart  0-9 Units Subcutaneous TID WC  . isosorbide mononitrate  60 mg Oral Daily  . labetalol  200 mg Oral BID  . loratadine  10 mg Oral Daily  . sodium chloride  3 mL Intravenous Q12H  . torsemide  40 mg Oral Daily   Continuous Infusions:    Lashun Mccants, DO  [facility] Pager [phone]  If 7PM-7AM, please contact  night-coverage [email or web address] Password [email or web address] 07/02/2014, 6:26 PM   LOS: 3 days

## 2014-07-03 DIAGNOSIS — D630 Anemia in neoplastic disease: Secondary | ICD-10-CM

## 2014-07-03 DIAGNOSIS — C9000 Multiple myeloma not having achieved remission: Secondary | ICD-10-CM

## 2014-07-03 DIAGNOSIS — I509 Heart failure, unspecified: Secondary | ICD-10-CM

## 2014-07-03 LAB — HEPATIC FUNCTION PANEL
ALT: 26 U/L (ref 17–63)
AST: 22 U/L (ref 15–41)
Albumin: 2.1 g/dL — ABNORMAL LOW (ref 3.5–5.0)
Alkaline Phosphatase: 56 U/L (ref 38–126)
BILIRUBIN TOTAL: 0.4 mg/dL (ref 0.3–1.2)
Bilirubin, Direct: <0.1 mg/dL — ABNORMAL LOW (ref 0.1–0.5)
Total Protein: 7.8 g/dL (ref 6.5–8.1)

## 2014-07-03 LAB — BASIC METABOLIC PANEL
ANION GAP: 10 (ref 5–15)
BUN: 79 mg/dL — AB (ref 6–20)
CALCIUM: 8.4 mg/dL — AB (ref 8.9–10.3)
CO2: 25 mmol/L (ref 22–32)
Chloride: 98 mmol/L — ABNORMAL LOW (ref 101–111)
Creatinine, Ser: 4.41 mg/dL — ABNORMAL HIGH (ref 0.61–1.24)
GFR, EST AFRICAN AMERICAN: 15 mL/min — AB (ref >60)
GFR, EST NON AFRICAN AMERICAN: 13 mL/min — AB (ref >60)
Glucose, Bld: 88 mg/dL (ref 65–99)
POTASSIUM: 4.9 mmol/L (ref 3.5–5.1)
Sodium: 133 mmol/L — ABNORMAL LOW (ref 135–145)

## 2014-07-03 LAB — SAVE SMEAR

## 2014-07-03 LAB — LIPASE, BLOOD: LIPASE: 27 U/L (ref 22–51)

## 2014-07-03 LAB — GLUCOSE, CAPILLARY
Glucose-Capillary: 75 mg/dL (ref 65–99)
Glucose-Capillary: 125 mg/dL — ABNORMAL HIGH (ref 65–99)
Glucose-Capillary: 118 mg/dL — ABNORMAL HIGH (ref 65–99)
Glucose-Capillary: 93 mg/dL (ref 65–99)

## 2014-07-03 LAB — TROPONIN I

## 2014-07-03 NOTE — Consult Note (Signed)
[place]  Telephone:(336) [place] NOTE  Eric [name]                                MR#: [id number]  DOB: [birth year]                       CSN#: [id number]  Referring MD:  [facility]  Patient Care Team: [name], NP as PCP - General (Internal Medicine)  Reason for Consult: Multiple Myeloma   OIB:BCWUG Eric [name] is a 59 y.o.   male  with multiple medical issues listed below, including Hep C and CKD, heart disease admitted on 6/7 with rash due to scabies infestation, edema. Labs were remarkable for acute on chronic renal failure, anemia, hyponatremia and proteinuria. SPEP showed a single peak (M-spike, 0.8) in the beta region (1.6) which may represent monoclonal protein.  Elevated lambda(140.65) and kappa light chains (826.92)  in serum, ratio at 5.88. Calcium is normal. UPEP pending. Smear has been ordered for review. 24 hr urine pending. Findings to date are suspicious for myeloma.   Denies polyuria, polydipsia, anorexia, nausea, vomiting, constipation, pancreatitis, peptic ulcer disease. He has lost 5 lbs in 2 weeks. 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 He is a poor historian, no confusion is reported. Denies risk for HIV. Patient denies history of recurrent infection or atypical infections such as shingles of meningitis at this time, but did have a remote history of spinal meningitis in the 1950s. Patient denies any history or family history of bone marrow disorders. Never had a bone marrow biopsy in the past. Denies history of abnormal bone pain or bone fracture. No other diagnostic testing including metastatic bone survey is available for review. We were requested to see this patient with recommendations.     PMH:  Past Medical History  Diagnosis Date  . Hypertension   . Hyperlipemia   . Edema   . Cardiac arrest due to  underlying cardiac condition   . Obesity   . Ventricular tachycardia   . AICD (automatic cardioverter/defibrillator) present   . CHF (congestive heart failure)   . Myocardial infarction 03/2013  . Pneumonia 2016  . Type II diabetes mellitus   . History of blood transfusion 1950's    "related to pinal menigitis; HAD 16 OPERATIONS TOTAL"  . Scabies infestation 06/29/2014    Surgeries:  Past Surgical History  Procedure Laterality Date  . Implantable cardioverter defibrillator implant  03/24/2013    STJ single chamber ICD implanted by Dr Caryl Comes for secondary prevention  . Left heart catheterization with coronary/graft angiogram  03/07/2013    Procedure: LEFT HEART CATHETERIZATION WITH [name];  Surgeon: Clent Demark, MD;  Location: [facility] CATH LAB;  Service: Cardiovascular;;  . Implantable cardioverter defibrillator implant N/A 03/24/2013    Procedure: IMPLANTABLE CARDIOVERTER DEFIBRILLATOR IMPLANT;  Surgeon: Deboraha Sprang, MD;  Location: [facility] CATH LAB;  Service: Cardiovascular;  Laterality: N/A;  . Cardiac catheterization    . Head surgery  1950'S    "for spinal menigitis; HAD 16 OPERATIONS TOTAL"  . Back surgery  1950's    "for spinal menigitis; HAD 16 OPERATIONS TOTAL"  . 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 Coronary artery bypass graft  1999  cabg x4    Allergies: No Known Allergies  Medications:   Scheduled Meds: . amLODipine  10 mg Oral Daily  . aspirin EC  81 mg Oral Daily  . heparin  5,000 Units Subcutaneous 3 times per day  . hydrALAZINE  25 mg Oral BID  . insulin aspart  0-9 Units Subcutaneous TID WC  . isosorbide mononitrate  60 mg Oral Daily  . labetalol  200 mg Oral BID  . loratadine  10 mg Oral Daily  . sodium chloride  3 mL Intravenous Q12H  . torsemide  40 mg Oral Daily   Continuous Infusions:  PRN Meds:.acetaminophen, HYDROcodone-acetaminophen, hydrOXYzine, ondansetron **OR** ondansetron (ZOFRAN) IV,  senna-docusate   ROS: See HPI for significant positives, rest of ROS is negative.   Family History:    Family History  Problem Relation Age of Onset  . Heart disease Mother   . Diabetes Mother     No family history of hematological  disorders.  Social History:  reports that he has quit smoking. His smoking use included Cigarettes. He has a 5 pack-year smoking history. He has never used smokeless tobacco. He reports that he does not drink alcohol or use illicit drugs. Married. 1 daughter. 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   Physical Exam    Filed Vitals:   07/03/14 0559  BP: 116/61  Pulse: 70  Temp: 98.2 F (36.8 C)  Resp: 18   Filed Weights   07/01/14 0608 07/02/14 0631 07/03/14 0559  Weight: 182 lb 15.7 oz (83 kg) 171 lb 15.3 oz (78 kg) 185 lb 13.6 oz (84.3 kg)    GENERAL:alert, no distress and comfortable.  SKIN: skin color, texture, turgor are normal, multiple excoriations noted EYES: normal, conjunctiva are pink and non-injected, sclera clear OROPHARYNX:no exudate, no erythema and lips, buccal mucosa, and tongue normal  NECK: supple, thyroid normal size, non-tender, without nodularity LYMPH:  no palpable lymphadenopathy in the cervical, axillary or inguinal area LUNGS: clear to auscultation and percussion with normal breathing effort HEART: regular rate & rhythm and no murmurs and 1+ lower extremity edema ABDOMEN:abdomen soft, non-tender and normal bowel sounds Musculoskeletal:no cyanosis of digits and no clubbing  PSYCH: alert & oriented x 3 with fluent speech NEURO: no focal motor/sensory deficits  LABS CBC   Recent Labs Lab 06/29/14 1414 07/01/14 0724  WBC 6.7 6.1  HGB 9.8* 9.2*  HCT 29.2* 27.4*  PLT 187 183  MCV 87.7 89.3  MCH 29.4 30.0  MCHC 33.6 33.6  RDW 12.5 12.8  LYMPHSABS 1.9  --   MONOABS 0.4  --   EOSABS 0.3  --   BASOSABS 0.0  --      CMP    Recent Labs Lab 06/29/14 1414 06/30/14 0610  07/01/14 0724 07/02/14 0544 07/03/14 0359  NA 136 137 134* 131* 133*  K 4.6 4.5 4.8 4.6 4.9  CL 106 106 101 99* 98*  CO2 21* 22 23 21* 25  GLUCOSE 131* 95 80 93 88  BUN 68* 62* 69* 75* 79*  CREATININE 3.04* 2.96* 3.68* 3.89* 4.41*  CALCIUM 8.9 8.8* 8.5* 8.1* 8.4*  AST 23  --   --   --  22  ALT 25  --   --   --  26  ALKPHOS 62  --   --   --  56  BILITOT 0.3  --   --   --  0.4     Anemia panel:  No results  for input(s): VITAMINB12, FOLATE, FERRITIN, TIBC, IRON, RETICCTPCT in the last 72 hours.   CBC   Recent Labs Lab 06/29/14 1414 07/01/14 0724  WBC 6.7 6.1  HGB 9.8* 9.2*  HCT 29.2* 27.4*  PLT 187 183  MCV 87.7 89.3  MCH 29.4 30.0  MCHC 33.6 33.6  RDW 12.5 12.8  LYMPHSABS 1.9  --   MONOABS 0.4  --   EOSABS 0.3  --   BASOSABS 0.0  --        Imaging Studies:  Dg Chest 2 View  06/29/2014   CLINICAL DATA:  59 year old male with rash and open wounds of unknown origin  EXAM: CHEST  2 VIEW  COMPARISON:  Prior chest x-ray 04/20/2014  FINDINGS: Cardiac and mediastinal contours remain within normal limits. Patient is status post median sternotomy. Left subclavian approach single lead cardiac rhythm maintenance device. The lead projects over the right ventricle. The lungs are clear. Expiratory phase timing noted incidentally on the lateral view. No evidence of failure. No acute osseous abnormality.  IMPRESSION: Stable chest x-ray without evidence of acute cardiopulmonary process.   Electronically Signed   By: Jacqulynn Cadet M.D.   On: 06/29/2014 16:21   US Abdomen Complete  06/29/2014   CLINICAL DATA:  History of cirrhosis  EXAM: ULTRASOUND ABDOMEN COMPLETE  COMPARISON:  None.  FINDINGS: Gallbladder: Well distended with multiple gallstones within. No significant gallbladder wall thickening or pericholecystic fluid is noted. A negative sonographic Percell Miller sign is seen.  Common bile duct: Diameter: 6 mm but within normal limits for the patient's given age.  Liver: Mild  heterogeneity is noted.  No focal mass lesion is seen.  IVC: No abnormality visualized.  Pancreas: Visualized portion unremarkable.  Spleen: Size and appearance within normal limits.  Right Kidney: Length: 15.3 cm. Echogenicity within normal limits. No mass or hydronephrosis visualized.  Left Kidney:  Not well visualized.  This may be congenitally absent  Abdominal aorta: No aneurysm visualized.  Other findings: None.  IMPRESSION: Cholelithiasis without complicating factors.  Heterogeneity of the liver consistent with the given clinical history. No other focal abnormality is noted.  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   On: 06/29/2014 19:13     A/P: 59 y.o. male admitted with    Suspected Multiple Myeloma versus other bone marrow process SPEP showed a single peak (M-spike, 0.8) in the beta region (1.6) which may represent monoclonal protein.   Elevated lambda(140.65) and kappa light chains (826.92)  in serum, ratio at 5.88 were noted Calcium is normal. UPEP pending.  Smear has been ordered for review, rule out rouleaux  24 hr urine pending.  Findings to date are suspicious for myeloma. Check a metastatic bone survey Pending on the results, may need to check Sed rate, quantitative immunoglobulins, LDH, B2 microglobulins,uric acid and Epo levels   Bone marrow biopsy for tissue diagnosis may need to be performed  Anemia Due to chronic disease and malignancy, infection. No bleeding issues are noted.  Transfusion is not indicated at this time  Renal Failure, acute on chronic Appreciate Renal involvement  Acute eon chronic CHF Last Echo EF 45-50 percent Appreciate primary team involvement  Full Code  DVT prophylaxis On heparin  WERTMAN,SARA E, PA-C 07/03/2014 11:00 AM  ADDENDUM:  I saw and examined the patient.  We had excellent fellowship. I really am dubious about him having myeloma.  It looks like he has a polyclonal increase in  his light  chains.  I really need to see the 24hr urine results.  I hope that an IFE was done on the urine.    His SPEP is not that impressive with an M-spike < 1.0 gm/dL.  I do find it interesting that the is Hepatitis C positive.  As such I wonder if the renal failure is some type of glomerulo nephropathy.  A bone survey needs to be done for evaluation of plasma cell disorder.  i think that a bone marrow bx would not be all that helpful right now.  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  He is very nice.  His daughter was with him today and I answered all of their questions.  We will follow along!!  Thanks!!!   Eric [name]  2 [address] 3:3

## 2014-07-03 NOTE — Progress Notes (Signed)
S: No new CO.  He is saving urine but it is not being recorded O:BP 116/61 mmHg  Pulse 70  Temp(Src) 98.2 F (36.8 C) (Oral)  Resp 18  Ht 5\' 6"  (1.676 m)  Wt 84.3 kg (185 lb 13.6 oz)  BMI 30.01 kg/m2  SpO2 97%  Intake/Output Summary (Last 24 hours) at 07/03/14 0946 Last data filed at 07/03/14 0939  Gross per 24 hour  Intake    840 ml  Output      0 ml  Net    840 ml   Weight change: 6.3 kg (13 lb 14.2 oz) [id number] and alert CVS:RRR Resp:clear Abd:+BS NTND Ext: 1+ edema Skin: Multiple excoriations NEURO:CNi Ox3 no asterixis   . amLODipine  10 mg Oral Daily  . aspirin EC  81 mg Oral Daily  . heparin  5,000 Units Subcutaneous 3 times per day  . hydrALAZINE  25 mg Oral BID  . insulin aspart  0-9 Units Subcutaneous TID WC  . isosorbide mononitrate  60 mg Oral Daily  . labetalol  200 mg Oral BID  . loratadine  10 mg Oral Daily  . sodium chloride  3 mL Intravenous Q12H  . torsemide  40 mg Oral Daily   No results found. BMET    Component Value Date/Time   NA 133* 07/03/2014 0359   K 4.9 07/03/2014 0359   CL 98* 07/03/2014 0359   CO2 25 07/03/2014 0359   GLUCOSE 88 07/03/2014 0359   BUN 79* 07/03/2014 0359   CREATININE 4.41* 07/03/2014 0359   CALCIUM 8.4* 07/03/2014 0359   GFRNONAA 13* 07/03/2014 0359   GFRAA 15* 07/03/2014 0359   CBC    Component Value Date/Time   WBC 6.1 07/01/2014 0724   RBC 3.07* 07/01/2014 0724   HGB 9.2* 07/01/2014 0724   HCT 27.4* 07/01/2014 0724   PLT 183 07/01/2014 0724   MCV 89.3 07/01/2014 0724   MCH 30.0 07/01/2014 0724   MCHC 33.6 07/01/2014 0724   RDW 12.8 07/01/2014 0724   LYMPHSABS 1.9 06/29/2014 1414   MONOABS 0.4 06/29/2014 1414   EOSABS 0.3 06/29/2014 1414   BASOSABS 0.0 06/29/2014 1414     Assessment:  1.  Acute on CKD 3 with chronic being from DM/HTN in solitary RT kidney and acute possible from hemodynamic perturbations from better BP control (baseline Scr around 3).  UO? Scr sl higher.  SPEP + for M-spike with  high kappa light chains 2. HTN, well controlled 3. DM HgA1c 6.2 4. Anemia  May need ESA 5. Proteinuria  Pr/Cr estimates 2.9 gms  Plan: 1. Will check immunofixation. 24hr urine for UPEP. I would ask hematology to see. 2. Recheck Scr in AM 3. Discussed the role of HD if renal fx were to continue to worsen to that point.  He would be willing to do it if needed.   Joachim Carton T

## 2014-07-03 NOTE — Progress Notes (Signed)
PROGRESS NOTE  Eric [name] [id number] DOB: [birth year] DOA: 06/29/2014 PCP: [name], NP  Brief history 59 year old male with a history of CKD stage III, diabetes mellitus, ischemic cardiomyopathy with EF 45-50 percent, VT arrest with history of AICD, and hypertension presented initially because of a rash on his trunk, arms, and legs. He was noted to have acute on chronic renal failure at the time of admission with a serum creatinine of 3.04. Therefore, the patient was admitted for further management. The patient appeared clinically fluid overloaded, and he was continued on his torsemide. Unfortunately, the patient's renal function continued to worsen. Nephrology was consulted. Workup has revealed increase N lambda light chains as well as M spike on SPEP. As a result, hematology was consulted for further recommendations. Assessment/Plan: Acute on chronic renal failure (CKD3) -The patient presented with serum creatinine 3.04 -Serum creatinine 1.33 on 11/26/2013 -Renal ultrasound negative for hydronephrosis, shows solitary kidney -The patient has significant proteinuria or multiple previous UAs -urine protein/creatinine--2.9grams -appreciate nephrology -Continue torsemide once daily -Discontinue potassium supplementation  -C3, C4--normal -Elevated lambda and kappa light chains in serum-->concerning for myeloma -07/03/14--heme/onc consulted today -await serum immunofixation, may need bone marrow  Lower extremity edema/Proteinuria -?component of Cor Pulmonale -his hep C and DM may be playing a role in his proteinuria -Dopplers were negative for DVT.  -07/01/2014 echocardiogram EF 45-50 percent; grade 1 diastolic dysfunction -urine protein/creatinine--2.9grams  Acute on Chronic systolic and diastolic CHF -suspect degree of Cor Pulmonale -EF unchanged from 11/27/13--45-50% with PAP 42 -continue diuresis -weight on 02/23/14= 162 lbs -07/03/14 weight--185  lbs -daily weights  Skin rash Thought to be due to bug bites or scabies.  -6/6/16Permethrin has been applied with some improvement -Appears this has been chronic as the patient has numerous excoriations that have been scarred over indicating a chronic process -Repeat permethrin on 07/06/2014  Diabetes mellitus type 2 with renal complications -[id number] hemoglobin A1c 6.2 -Continue NovoLog sliding scale -Discontinue glipizide in the setting of worsening renal failure  History of coronary artery disease/previous history of cardiac arrest/AICD -No concerning dysrhythmia on telemetry. PVCs noted. -November 2015 Myoview negative/low risk, EF 49% -hx of VT arrest -continue ASA 81 mg  History of Liver cirrhosis -Compensated. LFTs are normal. -Hepatitis C antibody positive -Hep C RNA and genotype--pending  Hypertension -Continue amlodipine 10 mg daily -Discontinue hydralazine due to soft blood pressures  -Continue Imdur  atypical chest pain -Cycle troponins -EKG without any ischemic changes  Family Communication: wife updated at beside Disposition Plan: Home when medically stable   Procedures/Studies: Dg Chest 2 View  06/29/2014   CLINICAL DATA:  59 year old male with rash and open wounds of unknown origin  EXAM: CHEST  2 VIEW  COMPARISON:  Prior chest x-ray 04/20/2014  FINDINGS: Cardiac and mediastinal contours remain within normal limits. Patient is status post median sternotomy. Left subclavian approach single lead cardiac rhythm maintenance device. The lead projects over the right ventricle. The lungs are clear. Expiratory phase timing noted incidentally on the lateral view. No evidence of failure. No acute osseous abnormality.  IMPRESSION: Stable chest x-ray without evidence of acute cardiopulmonary process.   Electronically Signed   By: Jacqulynn Cadet M.D.   On: 06/29/2014 16:21   US Abdomen Complete  06/29/2014   CLINICAL DATA:  History of cirrhosis  EXAM: ULTRASOUND  ABDOMEN COMPLETE  COMPARISON:  None.  FINDINGS: Gallbladder: Well distended with multiple gallstones within. No significant gallbladder wall thickening or pericholecystic  fluid is noted. A negative sonographic Percell Miller sign is seen.  Common bile duct: Diameter: 6 mm but within normal limits for the patient's given age.  Liver: Mild heterogeneity is noted.  No focal mass lesion is seen.  IVC: No abnormality visualized.  Pancreas: Visualized portion unremarkable.  Spleen: Size and appearance within normal limits.  Right Kidney: Length: 15.3 cm. Echogenicity within normal limits. No mass or hydronephrosis visualized.  Left Kidney:  Not well visualized.  This may be congenitally absent  Abdominal aorta: No aneurysm visualized.  Other findings: None.  IMPRESSION: Cholelithiasis without complicating factors.  Heterogeneity of the liver consistent with the given clinical history. No other focal abnormality is noted.  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   On: 06/29/2014 19:13         Subjective:  patient complains of intermittent chest discomfort. It is mildly reproducible. Denies any breast breath, nausea, vomiting, diarrhea, abdominal pain, dysuria, hematuria.  Objective: Filed Vitals:   07/02/14 1345 07/02/14 2148 07/03/14 0559 07/03/14 1359  BP: 95/52 117/59 116/61 94/63  Pulse: 66 70 70 63  Temp: 98 F (36.7 C) 97.7 F (36.5 C) 98.2 F (36.8 C)   TempSrc: Oral Oral Oral   Resp: 20 18 18 18   Height:      Weight:   84.3 kg (185 lb 13.6 oz)   SpO2: 96% 94% 97% 99%    Intake/Output Summary (Last 24 hours) at 07/03/14 1838 Last data filed at 07/03/14 1406  Gross per 24 hour  Intake    480 ml  Output   1200 ml  Net   -720 ml   Weight change: 6.3 kg (13 lb 14.2 oz) Exam:   General:  Pt is alert, follows commands appropriately, not in acute distress  HEENT: No icterus, No thrush, [place]/AT  Cardiovascular: RRR, S1/S2, no rubs, no  gallops  Respiratory: CTA bilaterally, no wheezing, no crackles, no rhonchi  Abdomen: Soft/+BS, non tender, non distended, no guarding; no hepatosplenomegaly   Extremities: 2+LE edema, No lymphangitis, No petechiae, No rashes, no synovitis  Data Reviewed: Basic Metabolic Panel:  Recent Labs Lab 06/29/14 1414 06/30/14 0610 07/01/14 0724 07/02/14 0544 07/03/14 0359  NA 136 137 134* 131* 133*  K 4.6 4.5 4.8 4.6 4.9  CL 106 106 101 99* 98*  CO2 21* 22 23 21* 25  GLUCOSE 131* 95 80 93 88  BUN 68* 62* 69* 75* 79*  CREATININE 3.04* 2.96* 3.68* 3.89* 4.41*  CALCIUM 8.9 8.8* 8.5* 8.1* 8.4*  PHOS  --   --   --  6.4*  --    Liver Function Tests:  Recent Labs Lab 06/29/14 1414 07/02/14 0544 07/03/14 0359  AST 23  --  22  ALT 25  --  26  ALKPHOS 62  --  56  BILITOT 0.3  --  0.4  PROT 8.5*  --  7.8  ALBUMIN 2.3* 2.1* 2.1*    Recent Labs Lab 07/03/14 0359  LIPASE 27   No results for input(s): AMMONIA in the last 168 hours. CBC:  Recent Labs Lab 06/29/14 1414 07/01/14 0724  WBC 6.7 6.1  NEUTROABS 4.1  --   HGB 9.8* 9.2*  HCT 29.2* 27.4*  MCV 87.7 89.3  PLT 187 183   Cardiac Enzymes:  Recent Labs Lab 06/29/14 1414 06/29/14 2020  CKTOTAL  --  294  TROPONINI 0.03  --    BNP: Invalid input(s): POCBNP CBG:  Recent  Labs Lab 07/02/14 1617 07/02/14 2146 07/03/14 0737 07/03/14 1208 07/03/14 1706  GLUCAP 177* 117* 75 125* 118*    No results found for this or any previous visit (from the past 240 hour(s)).   Scheduled Meds: . amLODipine  10 mg Oral Daily  . aspirin EC  81 mg Oral Daily  . heparin  5,000 Units Subcutaneous 3 times per day  . hydrALAZINE  25 mg Oral BID  . insulin aspart  0-9 Units Subcutaneous TID WC  . isosorbide mononitrate  60 mg Oral Daily  . labetalol  200 mg Oral BID  . loratadine  10 mg Oral Daily  . sodium chloride  3 mL Intravenous Q12H  . torsemide  40 mg Oral Daily   Continuous Infusions:    Burton Gahan, DO  [facility] Pager [phone]  If 7PM-7AM, please contact night-coverage [email or web address] Password [facility] 07/03/2014, 6:38 PM   LOS: 4 days

## 2014-07-04 ENCOUNTER — Inpatient Hospital Stay (HOSPITAL_COMMUNITY): Payer: Medicaid Other

## 2014-07-04 LAB — GLUCOSE, CAPILLARY
GLUCOSE-CAPILLARY: 105 mg/dL — AB (ref 65–99)
GLUCOSE-CAPILLARY: 88 mg/dL (ref 65–99)
Glucose-Capillary: 130 mg/dL — ABNORMAL HIGH (ref 65–99)
Glucose-Capillary: 149 mg/dL — ABNORMAL HIGH (ref 65–99)

## 2014-07-04 LAB — CBC WITH DIFFERENTIAL/PLATELET
Basophils Absolute: 0 10*3/uL (ref 0.0–0.1)
Basophils Relative: 0 % (ref 0–1)
Eosinophils Absolute: 0.1 10*3/uL (ref 0.0–0.7)
Eosinophils Relative: 3 % (ref 0–5)
HEMATOCRIT: 29.6 % — AB (ref 39.0–52.0)
Hemoglobin: 9.8 g/dL — ABNORMAL LOW (ref 13.0–17.0)
LYMPHS ABS: 1.7 10*3/uL (ref 0.7–4.0)
LYMPHS PCT: 29 % (ref 12–46)
MCH: 29.4 pg (ref 26.0–34.0)
MCHC: 33.1 g/dL (ref 30.0–36.0)
MCV: 88.9 fL (ref 78.0–100.0)
MONOS PCT: 7 % (ref 3–12)
Monocytes Absolute: 0.4 10*3/uL (ref 0.1–1.0)
NEUTROS PCT: 61 % (ref 43–77)
Neutro Abs: 3.5 10*3/uL (ref 1.7–7.7)
PLATELETS: 203 10*3/uL (ref 150–400)
RBC: 3.33 MIL/uL — ABNORMAL LOW (ref 4.22–5.81)
RDW: 12.3 % (ref 11.5–15.5)
WBC: 5.7 10*3/uL (ref 4.0–10.5)

## 2014-07-04 LAB — BASIC METABOLIC PANEL
ANION GAP: 10 (ref 5–15)
BUN: 80 mg/dL — AB (ref 6–20)
CHLORIDE: 97 mmol/L — AB (ref 101–111)
CO2: 24 mmol/L (ref 22–32)
CREATININE: 3.78 mg/dL — AB (ref 0.61–1.24)
Calcium: 8.6 mg/dL — ABNORMAL LOW (ref 8.9–10.3)
GFR calc non Af Amer: 16 mL/min — ABNORMAL LOW (ref >60)
GFR, EST AFRICAN AMERICAN: 19 mL/min — AB (ref >60)
GLUCOSE: 120 mg/dL — AB (ref 65–99)
POTASSIUM: 4.6 mmol/L (ref 3.5–5.1)
SODIUM: 131 mmol/L — AB (ref 135–145)

## 2014-07-04 LAB — TROPONIN I
Troponin I: <0.03 ng/mL (ref <0.031)
Troponin I: <0.03 ng/mL (ref <0.031)

## 2014-07-04 NOTE — Progress Notes (Addendum)
This RN noticed orders for a 24 hr urine that had not been collected. Called lab to verify that they did not have urine down there for this patient and they confirmed that they did not. 24 hr urine container obtained and patient notified that we needed to start collecting urine after the next time he voids. Container placed in room. Will begin collection after next void. MD notified as well that 24 hr had not been collected.

## 2014-07-04 NOTE — Progress Notes (Signed)
S: No new CO.   O:BP 144/82 mmHg  Pulse 75  Temp(Src) 98 F (36.7 C) (Oral)  Resp 18  Ht 5\' 6"  (1.676 m)  Wt 82.5 kg (181 lb 14.1 oz)  BMI 29.37 kg/m2  SpO2 99%  Intake/Output Summary (Last 24 hours) at 07/04/14 1040 Last data filed at 07/04/14 1032  Gross per 24 hour  Intake    480 ml  Output   2200 ml  Net  -1720 ml   Weight change: -1.8 kg (-3 lb 15.5 oz) [id number] and alert CVS:RRR Resp:clear Abd:+BS NTND Ext: tr-1+ edema Skin: Multiple excoriations NEURO:CNi Ox3 no asterixis   . amLODipine  10 mg Oral Daily  . aspirin EC  81 mg Oral Daily  . heparin  5,000 Units Subcutaneous 3 times per day  . insulin aspart  0-9 Units Subcutaneous TID WC  . isosorbide mononitrate  60 mg Oral Daily  . labetalol  200 mg Oral BID  . loratadine  10 mg Oral Daily  . sodium chloride  3 mL Intravenous Q12H  . torsemide  40 mg Oral Daily   No results found. BMET    Component Value Date/Time   NA 131* 07/04/2014 0638   K 4.6 07/04/2014 0638   CL 97* 07/04/2014 0638   CO2 24 07/04/2014 0638   GLUCOSE 120* 07/04/2014 0638   BUN 80* 07/04/2014 0638   CREATININE 3.78* 07/04/2014 0638   CALCIUM 8.6* 07/04/2014 0638   GFRNONAA 16* 07/04/2014 0638   GFRAA 19* 07/04/2014 0638   CBC    Component Value Date/Time   WBC 5.7 07/04/2014 0638   RBC 3.33* 07/04/2014 0638   HGB 9.8* 07/04/2014 0638   HCT 29.6* 07/04/2014 0638   PLT 203 07/04/2014 0638   MCV 88.9 07/04/2014 0638   MCH 29.4 07/04/2014 0638   MCHC 33.1 07/04/2014 0638   RDW 12.3 07/04/2014 0638   LYMPHSABS 1.7 07/04/2014 0638   MONOABS 0.4 07/04/2014 0638   EOSABS 0.1 07/04/2014 0638   BASOSABS 0.0 07/04/2014 [id number]     Assessment:  1.  Acute on CKD 3 with chronic being from DM/HTN in solitary RT kidney and acute possible from hemodynamic perturbations from better BP control (baseline Scr around 3).  UO good, Scr trending down 2. HTN, well controlled 3. DM HgA1c 6.2 4. Anemia  May need ESA 5. Proteinuria  Pr/Cr  estimates 2.9 gms 6. Monoclonal gammopathy  Plan: 1. Cont with torsemide 2.  Finish 24hr urine.  To get bone survey 3. Recheck Scr in AM.  If lower tomorrow then he could go from renal standpoint.  Viann Nielson T

## 2014-07-04 NOTE — Progress Notes (Signed)
PROGRESS NOTE  Eric [name] GHW:[id number] DOB: [birth year] DOA: 06/29/2014 PCP: [name], NP  Brief history 59 year old male with a history of CKD stage III, diabetes mellitus, ischemic cardiomyopathy with EF 45-50 percent, VT arrest with history of AICD, and hypertension presented initially because of a rash on his trunk, arms, and legs. He was noted to have acute on chronic renal failure at the time of admission with a serum creatinine of 3.04. Therefore, the patient was admitted for further management. The patient appeared clinically fluid overloaded, and he was continued on his torsemide. Unfortunately, the patient's renal function continued to worsen. Nephrology was consulted. Workup has revealed increase N lambda light chains as well as M spike on SPEP. As a result, hematology was consulted for further recommendations. Assessment/Plan: Acute on chronic renal failure (CKD3) -The patient presented with serum creatinine 3.04 -Serum creatinine 1.33 on 11/26/2013 -Renal ultrasound negative for hydronephrosis, shows solitary kidney -The patient has significant proteinuria or multiple previous UAs -urine protein/creatinine--2.9grams -appreciate nephrology -Continue torsemide once daily -Discontinue potassium supplementation  -C3, C4--normal -Elevated lambda and kappa light chains in serum-->concerning for myeloma -07/03/14--appreciate heme/onc-->questioning whether pt may really have myeloma-->awaiting 24hr urine with IFE -Unfortunately, there was a problem with his 24-hour urine collection, and it had to be restarted on 07/04/2014 -await urine immunofixation -07/04/2014--some improvement in renal function--serum creatinine 3.78  Lower extremity edema/Proteinuria -?component of Cor Pulmonale -his hep C and DM may be playing a role in his proteinuria -Dopplers were negative for DVT.  -07/01/2014 echocardiogram EF 45-50 percent; grade 1 diastolic dysfunction -urine  protein/creatinine--2.9grams  Acute on Chronic systolic and diastolic CHF -suspect degree of Cor Pulmonale -EF unchanged from 11/27/13--45-50% with PAP 42 -continue diuresis -weight on 02/23/14= 162 lbs -07/03/14 weight--185 lbs -daily weights  Skin rash Thought to be due to bug bites or scabies.  -6/6/16Permethrin has been applied with some improvement -Appears this has been chronic as the patient has numerous excoriations that have been scarred over indicating a chronic process -Repeat permethrin on 07/06/2014   Diabetes mellitus type 2 with renal complications -78/93/8101 hemoglobin A1c 6.2 -Continue NovoLog sliding scale -Discontinue glipizide in the setting of worsening renal failure  History of coronary artery disease/previous history of cardiac arrest/AICD -No concerning dysrhythmia on telemetry. PVCs noted. -November 2015 Myoview negative/low risk, EF 49% -hx of VT arrest -continue ASA 81 mg  History of Liver cirrhosis -Compensated. LFTs are normal. -Hepatitis C antibody positive -Hep C RNA and genotype--pending  Hypertension -Continue amlodipine 10 mg daily -Discontinue hydralazine due to soft blood pressures  -Continue Imdur atypical chest pain -Cycle troponins--neg x 3 -EKG without any ischemic changes  Family Communication: wife updated at beside Disposition Plan: Home when medically stable      Procedures/Studies: Dg Chest 2 View  06/29/2014   CLINICAL DATA:  59 year old male with rash and open wounds of unknown origin  EXAM: CHEST  2 VIEW  COMPARISON:  Prior chest x-ray 04/20/2014  FINDINGS: Cardiac and mediastinal contours remain within normal limits. Patient is status post median sternotomy. Left subclavian approach single lead cardiac rhythm maintenance device. The lead projects over the right ventricle. The lungs are clear. Expiratory phase timing noted incidentally on the lateral view. No evidence of failure. No acute osseous abnormality.   IMPRESSION: Stable chest x-ray without evidence of acute cardiopulmonary process.   Electronically Signed   By: Jacqulynn Cadet M.D.   On: 06/29/2014 16:21   US Abdomen Complete  06/29/2014   CLINICAL DATA:  History of cirrhosis  EXAM: ULTRASOUND ABDOMEN COMPLETE  COMPARISON:  None.  FINDINGS: Gallbladder: Well distended with multiple gallstones within. No significant gallbladder wall thickening or pericholecystic fluid is noted. A negative sonographic Percell Miller sign is seen.  Common bile duct: Diameter: 6 mm but within normal limits for the patient's given age.  Liver: Mild heterogeneity is noted.  No focal mass lesion is seen.  IVC: No abnormality visualized.  Pancreas: Visualized portion unremarkable.  Spleen: Size and appearance within normal limits.  Right Kidney: Length: 15.3 cm. Echogenicity within normal limits. No mass or hydronephrosis visualized.  Left Kidney:  Not well visualized.  This may be congenitally absent  Abdominal aorta: No aneurysm visualized.  Other findings: None.  IMPRESSION: Cholelithiasis without complicating factors.  Heterogeneity of the liver consistent with the given clinical history. No other focal abnormality is noted.  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   On: 06/29/2014 19:13   Dg Bone Survey Met  07/04/2014   CLINICAL DATA:  Renal failure.  Rule out multiple myeloma.  EXAM: METASTATIC BONE SURVEY  COMPARISON:  None.  FINDINGS: No lytic lesions within the calvarium.  AP views of both shoulders demonstrate no focal lytic lesion.  AP views of both humeri demonstrate no focal lytic lesions.  AP views of both forearms demonstrate no focal lytic lesions.  AP and lateral views of the cervical spine demonstrate no focal osseous lesion. Nonspecific straightening of expected lordosis.  Two views of the thoracic spine demonstrate obliquity on the lateral view. Grossly maintained vertebral body height, without focal lucent lesion.  Two  views of the lumbar spine demonstrate mild convex right curvature. Maintenance of vertebral body height, without focal lesion. Abdominal aortic atherosclerosis.  Frontal view of the chest demonstrates pacer/AICD device. Prior median sternotomy. Midline trachea. Mild cardiomegaly. Mild left hemidiaphragm elevation. Clear lungs. No focal osseous lesion.  AP view of the pelvis and AP views of both femurs. Vascular calcifications. No focal osseous lesion.  AP views of both tibias and fibulas. No focal osseous lesion. Surgical clips about the medial left thigh.  IMPRESSION: No evidence of multiple myeloma.   Electronically Signed   By: Abigail Miyamoto M.D.   On: 07/04/2014 13:29         Subjective: Patient is eating well. He denies any nausea, vomiting, diarrhea, abdominal pain. He is complaining intermittent chest pain. Denies any shortness of breath, dizziness, dysuria, hematuria.   Objective: Filed Vitals:   07/03/14 1359 07/03/14 2206 07/04/14 0448 07/04/14 1337  BP: 94/63 134/73 144/82 103/61  Pulse: 63 65 75 64  Temp:  97.5 F (36.4 C) 98 F (36.7 C) 97.5 F (36.4 C)  TempSrc:  Oral Oral Oral  Resp: _0 Height:      Weight:   82.5 kg (181 lb 14.1 oz)   SpO2: 99% 100% 99% 99%    Intake/Output Summary (Last 24 hours) at 07/04/14 1848 Last data filed at 07/04/14 1801  Gross per 24 hour  Intake    720 ml  Output   2700 ml  Net  -1980 ml   Weight change: -1.8 kg (-3 lb 15.5 oz) Exam:   General:  Pt is alert, follows commands appropriately, not in acute distress  HEENT: No icterus, No thrush,  [place]/AT  Cardiovascular: RRR, S1/S2, no rubs, no gallops  Respiratory: CTA bilaterally, no wheezing, no crackles, no rhonchi  Abdomen:  Soft/+BS, non tender, non distended, no guarding  Extremities:2+ LE edema, No lymphangitis, No petechiae, No rashes, no synovitis  Data Reviewed: Basic Metabolic Panel:  Recent Labs Lab 06/30/14 0610 07/01/14 0724 07/02/14 0544  07/03/14 0359 07/04/14 0638  NA 137 134* 131* 133* 131*  K 4.5 4.8 4.6 4.9 4.6  CL 106 101 99* 98* 97*  CO2 22 23 21* 25 24  GLUCOSE 95 80 93 88 120*  BUN 62* 69* 75* 79* 80*  CREATININE 2.96* 3.68* 3.89* 4.41* 3.78*  CALCIUM 8.8* 8.5* 8.1* 8.4* 8.6*  PHOS  --   --  6.4*  --   --    Liver Function Tests:  Recent Labs Lab 06/29/14 1414 07/02/14 0544 07/03/14 0359  AST 23  --  22  ALT 25  --  26  ALKPHOS 62  --  56  BILITOT 0.3  --  0.4  PROT 8.5*  --  7.8  ALBUMIN 2.3* 2.1* 2.1*    Recent Labs Lab 07/03/14 0359  LIPASE 27   No results for input(s): AMMONIA in the last 168 hours. CBC:  Recent Labs Lab 06/29/14 1414 07/01/14 0724 07/04/14 0638  WBC 6.7 6.1 5.7  NEUTROABS 4.1  --  3.5  HGB 9.8* 9.2* 9.8*  HCT 29.2* 27.4* 29.6*  MCV 87.7 89.3 88.9  PLT 187 183 203   Cardiac Enzymes:  Recent Labs Lab 06/29/14 1414 06/29/14 2020 07/03/14 1920 07/04/14 0024 07/04/14 0638  CKTOTAL  --  294  --   --   --   TROPONINI 0.03  --  <0.03 <0.03 <0.03   BNP: Invalid input(s): POCBNP CBG:  Recent Labs Lab 07/03/14 1706 07/03/14 2229 07/04/14 0740 07/04/14 1133 07/04/14 1711  GLUCAP 118* 93 105* 130* 149*    No results found for this or any previous visit (from the past 240 hour(s)).   Scheduled Meds: . amLODipine  10 mg Oral Daily  . aspirin EC  81 mg Oral Daily  . heparin  5,000 Units Subcutaneous 3 times per day  . insulin aspart  0-9 Units Subcutaneous TID WC  . isosorbide mononitrate  60 mg Oral Daily  . labetalol  200 mg Oral BID  . loratadine  10 mg Oral Daily  . sodium chloride  3 mL Intravenous Q12H  . torsemide  40 mg Oral Daily   Continuous Infusions:    Elleigh Cassetta, DO  [facility] Pager [phone]  If 7PM-7AM, please contact night-coverage [email or web address] Password [facility] 07/04/2014, 6:48 PM   LOS: 5 days

## 2014-07-05 LAB — RENAL FUNCTION PANEL
ALBUMIN: 2.4 g/dL — AB (ref 3.5–5.0)
Anion gap: 11 (ref 5–15)
BUN: 85 mg/dL — ABNORMAL HIGH (ref 6–20)
CHLORIDE: 94 mmol/L — AB (ref 101–111)
CO2: 24 mmol/L (ref 22–32)
Calcium: 8.3 mg/dL — ABNORMAL LOW (ref 8.9–10.3)
Creatinine, Ser: 3.61 mg/dL — ABNORMAL HIGH (ref 0.61–1.24)
GFR calc non Af Amer: 17 mL/min — ABNORMAL LOW (ref >60)
GFR, EST AFRICAN AMERICAN: 20 mL/min — AB (ref >60)
Glucose, Bld: 80 mg/dL (ref 65–99)
Phosphorus: 6 mg/dL — ABNORMAL HIGH (ref 2.5–4.6)
Potassium: 4.6 mmol/L (ref 3.5–5.1)
Sodium: 129 mmol/L — ABNORMAL LOW (ref 135–145)

## 2014-07-05 LAB — PROTEIN, URINE, 24 HOUR
Collection Interval-UPROT: 24 hours
PROTEIN, URINE: 186 mg/dL
Protein, 24H Urine: 5580 mg/d — ABNORMAL HIGH (ref 50–100)
URINE TOTAL VOLUME-UPROT: 3000 mL

## 2014-07-05 LAB — GLUCOSE, CAPILLARY
Glucose-Capillary: 87 mg/dL (ref 65–99)
Glucose-Capillary: 145 mg/dL — ABNORMAL HIGH (ref 65–99)
Glucose-Capillary: 123 mg/dL — ABNORMAL HIGH (ref 65–99)

## 2014-07-05 MED ORDER — HYDROCODONE-ACETAMINOPHEN 5-325 MG PO TABS
1.0000 | ORAL_TABLET | Freq: Once | ORAL | Status: AC
  Administered 2014-07-05: 1 via ORAL
  Filled 2014-07-05: qty 1

## 2014-07-05 NOTE — Discharge Summary (Signed)
Physician Discharge Summary  Eric [name] OZH:[id number] DOB: 1955-02-28 DOA: 06/29/2014  PCP: [name], NP  Admit date: 06/29/2014 Discharge date: 07/06/14 Recommendations for Outpatient Follow-up:  1. Pt will need to follow up with PCP in 2 weeks post discharge 2. Please obtain BMP in one week 3. Please follow up on immunofixation (from urine) and UPEP results  Discharge Diagnoses:  Acute on chronic renal failure (CKD3) -The patient presented with serum creatinine 3.04 -It was felt that hemodynamic changes in the setting of diuretic use due to the patient's acute on chronic renal failure as well as a degree of progression of his renal disease -Serum creatinine 1.33 on 11/26/2013 -Renal ultrasound negative for hydronephrosis, shows solitary kidney -The patient has significant proteinuria or multiple previous UAs -urine protein/creatinine ratio--2.9grams -appreciate nephrology -Continue torsemide 40 mg once daily after d/c -Discontinue potassium supplementation  -C3, C4--normal -Elevated lambda and kappa light chains in serum-->concerning for myeloma vs MGUS  -07/03/14--appreciate heme/onc-->questioning whether pt may really have myeloma-->awaiting 24hr urine with IFE to finish -Unfortunately, there was a problem with his 24-hour urine collection, and it had to be restarted on 07/04/2014 (pt was pouring urine into commode rather than jug) -await urine immunofixation results -07/05/2014--continued improvement in renal function--serum creatinine 3.53  -suspect he will have new renal baseline  Lower extremity edema/Proteinuria -?component of Cor Pulmonale  -his hep C and DM may be playing a role in his proteinuria -Dopplers were negative for DVT.  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 Marin Olp  Acute on Chronic systolic and diastolic CHF -suspect degree of Cor Pulmonale -EF unchanged from 11/27/13--45-50% with PAP 42 -continue diuresis -weight on 02/23/14= 162 lbs -07/03/14 weight--185 lbs -daily weights -stable on RA  Skin rash Thought to be due to bug bites or scabies.  -6/6/16Permethrin has been applied with some improvement -Appears this has been chronic as the patient has numerous excoriations that have been scarred over indicating a chronic process -Distribution of the rash was not typical of scabies -Repeat permethrin on 07/06/2014  -Nevertheless, the patient's pruritus improved  Diabetes mellitus type 2 with renal complications -62/95/2841 hemoglobin A1c 6.2 -Continue NovoLog sliding scale -Discontinue glipizide in the setting of worsening renal failure--patient had multiple episodes of hypoglycemia prior to discontinuation of glipizide -Due to the patient's renal function, his glipizide will be decreased to 2.5 mg daily. Patient was instructed to monitor his CBGs after discharge  History of coronary artery disease/previous history of cardiac arrest/AICD -No concerning dysrhythmia on telemetry. PVCs noted. -November 2015 Myoview negative/low risk, EF 49% -hx of VT arrest -continue ASA 81 mg  History of Liver cirrhosis -Compensated. LFTs are normal. -Hepatitis C antibody positive -Hep C RNA and genotype--pending  Hypertension -Continue amlodipine 10 mg daily -Discontinue hydralazine due to soft blood pressures  -Continue Imdur 60 mg daily -Continue labetalol 200 mg twice a day atypical chest pain -Cycle troponins--neg x 3 -EKG without any ischemic changes  Family Communication: no family present Disposition Plan: Home when medically stable  Discharge Condition: stable  Disposition:  Follow-up Information    Follow up with MATTINGLY,MICHAEL T, MD In 1 week.   Specialty:  Nephrology   Contact  information:   [place] [address] [phone]       Follow up with [name], MICHELLE P, NP In 1 week.   Specialty:  Internal Medicine   Contact information:   [address] [place] [address] [phone]       Follow up with Josph Macho, MD In 2 weeks.   Specialty:  Oncology   Contact information:   [address] 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 He was noted to have acute on chronic renal failure at the time of admission with a serum creatinine of 3.04. Therefore, the patient was admitted for further management. The patient appeared clinically fluid overloaded, and he was continued on his torsemide 40 mg twice a day. Unfortunately, the patient's renal function continued to worsen. Nephrology was consulted. Workup has revealed increase Kappa and lambda light chains as well as M spike on SPEP. As a result, hematology was consulted for further recommendations. Skeletal survey was ordered and was negative. 24-hour urine was ordered for immunofixation as well as urine protein electrophoresis. The results are still pending. 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 The patient wanted to follow up with hematology in outpatient setting for further workup and results. In addition, the patient will be set up with an outpatient nephrology appointment. His serum creatinine peaked at 4.41. Gradually, his serum creatinine improved. The patient likely has a new renal baseline. It was thought that ATN from hemodynamic changes may have resulted  in the patient's acute on chronic renal failure in the setting of diuretics.  Consultants: [facility]   Discharge Exam: Filed Vitals:   07/06/14 0558  BP: 131/78  Pulse: 71  Temp: 97.8 F (36.6 C)  Resp: 18   Filed Vitals:   07/05/14 1428 07/05/14 2210 07/05/14 2235 07/06/14 0558  BP: 103/61 160/74 129/70 131/78  Pulse: 61 78  71  Temp: 97.9 F (36.6 C) 97.6 F (36.4 C)  97.8 F (36.6 C)  TempSrc: Oral Oral  Oral  Resp: 18 17  18   Height:      Weight:    84.3 kg (185 lb 13.6 oz)  SpO2: 99% 100%  97%   General: A&O x 3, NAD, pleasant, cooperative Cardiovascular: RRR, no rub, no gallop, no S3 Respiratory: Diminished at the bases without wheezing Abdomen:soft, nontender, nondistended, positive bowel sounds Extremities: 2+LE edema, No lymphangitis, no petechiae  Discharge Instructions      Discharge Instructions    Diet - low sodium heart healthy    Complete by:  As directed      Increase activity slowly    Complete by:  As directed             Medication List    STOP taking these medications        hydrALAZINE 50 MG tablet  Commonly known as:  APRESOLINE     potassium chloride 10 MEQ tablet  Commonly known as:  K-DUR,KLOR-CON      TAKE these medications        amLODipine 10 MG tablet  Commonly known as:  NORVASC  Take 10 mg by mouth daily.     aspirin EC 81 MG tablet  Take 81  mg by mouth daily.     glipiZIDE 5 MG tablet  Commonly known as:  GLUCOTROL  Take 0.5 tablets (2.5 mg total) by mouth daily before breakfast.     isosorbide mononitrate 60 MG 24 hr tablet  Commonly known as:  IMDUR  Take 1 tablet (60 mg total) by mouth daily.     labetalol 200 MG tablet  Commonly known as:  NORMODYNE  Take 1 tablet (200 mg total) by mouth 2 (two) times daily.     torsemide 20 MG tablet  Commonly known as:  DEMADEX  Take 2 tablets (40 mg total) by mouth daily.         The results of significant diagnostics from this  hospitalization (including imaging, microbiology, ancillary and laboratory) are listed below for reference.    Significant Diagnostic Studies: Dg Chest 2 View  06/29/2014   CLINICAL DATA:  59 year old male with rash and open wounds of unknown origin  EXAM: CHEST  2 VIEW  COMPARISON:  Prior chest x-ray 04/20/2014  FINDINGS: Cardiac and mediastinal contours remain within normal limits. Patient is status post median sternotomy. Left subclavian approach single lead cardiac rhythm maintenance device. The lead projects over the right ventricle. The lungs are clear. Expiratory phase timing noted incidentally on the lateral view. No evidence of failure. No acute osseous abnormality.  IMPRESSION: Stable chest x-ray without evidence of acute cardiopulmonary process.   Electronically Signed   By: Jacqulynn Cadet M.D.   On: 06/29/2014 16:21   US Abdomen Complete  06/29/2014   CLINICAL DATA:  History of cirrhosis  EXAM: ULTRASOUND ABDOMEN COMPLETE  COMPARISON:  None.  FINDINGS: Gallbladder: Well distended with multiple gallstones within. No significant gallbladder wall thickening or pericholecystic fluid is noted. A negative sonographic Percell Miller sign is seen.  Common bile duct: Diameter: 6 mm but within normal limits for the patient's given age.  Liver: Mild heterogeneity is noted.  No focal mass lesion is seen.  IVC: No abnormality visualized.  Pancreas: Visualized portion unremarkable.  Spleen: Size and appearance within normal limits.  Right Kidney: Length: 15.3 cm. Echogenicity within normal limits. No mass or hydronephrosis visualized.  Left Kidney:  Not well visualized.  This may be congenitally absent  Abdominal aorta: No aneurysm visualized.  Other findings: None.  IMPRESSION: Cholelithiasis without complicating factors.  Heterogeneity of the liver consistent with the given clinical history. No other focal abnormality is noted.  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    On: 06/29/2014 19:13   Dg Bone Survey Met  07/04/2014   CLINICAL DATA:  Renal failure.  Rule out multiple myeloma.  EXAM: METASTATIC BONE SURVEY  COMPARISON:  None.  FINDINGS: No lytic lesions within the calvarium.  AP views of both shoulders demonstrate no focal lytic lesion.  AP views of both humeri demonstrate no focal lytic lesions.  AP views of both forearms demonstrate no focal lytic lesions.  AP and lateral views of the cervical spine demonstrate no focal osseous lesion. Nonspecific straightening of expected lordosis.  Two views of the thoracic spine demonstrate obliquity on the lateral view. Grossly maintained vertebral body height, without focal lucent lesion.  Two views of the lumbar spine demonstrate mild convex right curvature. Maintenance of vertebral body height, without focal lesion. Abdominal aortic atherosclerosis.  Frontal view of the chest demonstrates pacer/AICD device. Prior median sternotomy. Midline trachea. Mild cardiomegaly. Mild left hemidiaphragm elevation. Clear lungs. No focal osseous lesion.  AP view  of the pelvis and AP views of both femurs. Vascular calcifications. No focal osseous lesion.  AP views of both tibias and fibulas. No focal osseous lesion. Surgical clips about the medial left thigh.  IMPRESSION: No evidence of multiple myeloma.   Electronically Signed   By: Abigail Miyamoto M.D.   On: 07/04/2014 13:29     Microbiology: No results found for this or any previous visit (from the past 240 hour(s)).   Labs: Basic Metabolic Panel:  Recent Labs Lab 07/02/14 0544 07/03/14 0359 07/04/14 0638 07/05/14 0555 07/06/14 0559  NA 131* 133* 131* 129* 132*  K 4.6 4.9 4.6 4.6 4.6  CL 99* 98* 97* 94* 100*  CO2 21* $Remov'25 24 24 23  'PePKHh$ GLUCOSE 93 88 120* 80 85  BUN 75* 79* 80* 85* 88*  CREATININE 3.89* 4.41* 3.78* 3.61* 3.53*  CALCIUM 8.1* 8.4* 8.6* 8.3* 8.4*  PHOS 6.4*  --   --  6.0*  --    Liver Function Tests:  Recent Labs Lab 06/29/14 1414 07/02/14 0544  07/03/14 0359 07/05/14 0555  AST 23  --  22  --   ALT 25  --  26  --   ALKPHOS 62  --  56  --   BILITOT 0.3  --  0.4  --   PROT 8.5*  --  7.8  --   ALBUMIN 2.3* 2.1* 2.1* 2.4*    Recent Labs Lab 07/03/14 0359  LIPASE 27   No results for input(s): AMMONIA in the last 168 hours. CBC:  Recent Labs Lab 06/29/14 1414 07/01/14 0724 07/04/14 0638  WBC 6.7 6.1 5.7  NEUTROABS 4.1  --  3.5  HGB 9.8* 9.2* 9.8*  HCT 29.2* 27.4* 29.6*  MCV 87.7 89.3 88.9  PLT 187 183 203   Cardiac Enzymes:  Recent Labs Lab 06/29/14 1414 06/29/14 2020 07/03/14 1920 07/04/14 0024 07/04/14 0638 07/05/14 2258  CKTOTAL  --  294  --   --   --   --   TROPONINI 0.03  --  <0.03 <0.03 <0.03 <0.03   BNP: Invalid input(s): POCBNP CBG:  Recent Labs Lab 07/04/14 1711 07/04/14 2112 07/05/14 0615 07/05/14 1632 07/05/14 2209  GLUCAP 149* 88 87 145* 123*    Time coordinating discharge:  Greater than 30 minutes  Signed:  Espyn Radwan, DO [facility] Pager: [phone] 07/06/2014, 9:20 AM

## 2014-07-05 NOTE — Progress Notes (Signed)
S: No new CO.   O:BP 143/81 mmHg  Pulse 70  Temp(Src) 98.2 F (36.8 C) (Oral)  Resp 18  Ht _0  (1.676 m)  Wt 84.324 kg (185 lb 14.4 oz)  BMI 30.02 kg/m2  SpO2 98%  Intake/Output Summary (Last 24 hours) at 07/05/14 1019 Last data filed at 07/05/14 1000  Gross per 24 hour  Intake    719 ml  Output   1000 ml  Net   -281 ml   Weight change: 1.824 kg (4 lb 0.3 oz) BUL:AGTXM and alert CVS:RRR Resp:clear Abd:+BS NTND Ext: tr-1+ edema Skin: Multiple excoriations NEURO:CNi Ox3 no asterixis   . amLODipine  10 mg Oral Daily  . aspirin EC  81 mg Oral Daily  . heparin  5,000 Units Subcutaneous 3 times per day  . insulin aspart  0-9 Units Subcutaneous TID WC  . isosorbide mononitrate  60 mg Oral Daily  . labetalol  200 mg Oral BID  . loratadine  10 mg Oral Daily  . sodium chloride  3 mL Intravenous Q12H  . torsemide  40 mg Oral Daily   Dg Bone Survey Met  07/04/2014   CLINICAL DATA:  Renal failure.  Rule out multiple myeloma.  EXAM: METASTATIC BONE SURVEY  COMPARISON:  None.  FINDINGS: No lytic lesions within the calvarium.  AP views of both shoulders demonstrate no focal lytic lesion.  AP views of both humeri demonstrate no focal lytic lesions.  AP views of both forearms demonstrate no focal lytic lesions.  AP and lateral views of the cervical spine demonstrate no focal osseous lesion. Nonspecific straightening of expected lordosis.  Two views of the thoracic spine demonstrate obliquity on the lateral view. Grossly maintained vertebral body height, without focal lucent lesion.  Two views of the lumbar spine demonstrate mild convex right curvature. Maintenance of vertebral body height, without focal lesion. Abdominal aortic atherosclerosis.  Frontal view of the chest demonstrates pacer/AICD device. Prior median sternotomy. Midline trachea. Mild cardiomegaly. Mild left hemidiaphragm elevation. Clear lungs. No focal osseous lesion.  AP view of the pelvis and AP views of both femurs. Vascular  calcifications. No focal osseous lesion.  AP views of both tibias and fibulas. No focal osseous lesion. Surgical clips about the medial left thigh.  IMPRESSION: No evidence of multiple myeloma.   Electronically Signed   By: Abigail Miyamoto M.D.   On: 07/04/2014 13:29   BMET    Component Value Date/Time   NA 129* 07/05/2014 0555   K 4.6 07/05/2014 0555   CL 94* 07/05/2014 0555   CO2 24 07/05/2014 0555   GLUCOSE 80 07/05/2014 0555   BUN 85* 07/05/2014 0555   CREATININE 3.61* 07/05/2014 0555   CALCIUM 8.3* 07/05/2014 0555   GFRNONAA 17* 07/05/2014 0555   GFRAA 20* 07/05/2014 0555   CBC    Component Value Date/Time   WBC 5.7 07/04/2014 0638   RBC 3.33* 07/04/2014 0638   HGB 9.8* 07/04/2014 0638   HCT 29.6* 07/04/2014 0638   PLT 203 07/04/2014 0638   MCV 88.9 07/04/2014 0638   MCH 29.4 07/04/2014 0638   MCHC 33.1 07/04/2014 0638   RDW 12.3 07/04/2014 0638   LYMPHSABS 1.7 07/04/2014 0638   MONOABS 0.4 07/04/2014 0638   EOSABS 0.1 07/04/2014 0638   BASOSABS 0.0 07/04/2014 4680     Assessment:  1.  Acute on CKD 3 with chronic being from DM/HTN in solitary RT kidney and acute possible from hemodynamic perturbations from better BP control (baseline Scr  around 3).  UO good, Scr sl lower 2. HTN, well controlled 3. DM HgA1c 6.2 4. Anemia  May need ESA 5. Proteinuria  Pr/Cr estimates 2.9 gms 6. Monoclonal gammopathy 7.  Hep C  RNA-PCR Pend  Plan: 1. He can go from my standpoint.  Will arrange outpt labs and FU 1. Eric [name] T

## 2014-07-05 NOTE — Progress Notes (Signed)
PROGRESS NOTE  Eric [name] ZOX:[id number] DOB: [birth year] DOA: 06/29/2014 PCP: [name], NP    Brief history 59 year old male with a history of CKD stage III, diabetes mellitus, ischemic cardiomyopathy with EF 45-50 percent, VT arrest with history of AICD, and hypertension presented initially because of a rash on his trunk, arms, and legs. He was noted to have acute on chronic renal failure at the time of admission with a serum creatinine of 3.04. Therefore, the patient was admitted for further management. The patient appeared clinically fluid overloaded, and he was continued on his torsemide. Unfortunately, the patient's renal function continued to worsen. Nephrology was consulted. Workup has revealed increase N lambda light chains as well as M spike on SPEP. As a result, hematology was consulted for further recommendations. Assessment/Plan: Acute on chronic renal failure (CKD3) -The patient presented with serum creatinine 3.04 -Serum creatinine 1.33 on 11/26/2013 -Renal ultrasound negative for hydronephrosis, shows solitary kidney -The patient has significant proteinuria or multiple previous UAs -urine protein/creatinine--2.9grams -appreciate nephrology -Continue torsemide once daily -Discontinue potassium supplementation  -C3, C4--normal -Elevated lambda and kappa light chains in serum-->concerning for myeloma -07/03/14--appreciate heme/onc-->questioning whether pt may really have myeloma-->awaiting 24hr urine with IFE to finish -Unfortunately, there was a problem with his 24-hour urine collection, and it had to be restarted on 07/04/2014 (pt was pouring urine into commode rather than jug) -await urine immunofixation -07/05/2014--continued improvement in renal function--serum creatinine 3.61  Lower extremity edema/Proteinuria -?component of Cor Pulmonale  -his hep C and DM may be playing a role in his proteinuria -Dopplers were negative for DVT.   -07/01/2014 echocardiogram EF 45-50 percent; grade 1 diastolic dysfunction -urine protein/creatinine--2.9grams  Abnormal SPEP/MSpike/Monoclonal Gammapathy -suspect MGUS -finish 24 hr urine for IFE -skeletal survey neg -will need outpt heme/onc f/u with Dr. Marin Olp  Acute on Chronic systolic and diastolic CHF -suspect degree of Cor Pulmonale -EF unchanged from 11/27/13--45-50% with PAP 42 -continue diuresis -weight on 02/23/14= 162 lbs -07/03/14 weight--185 lbs -daily weights  Skin rash Thought to be due to bug bites or scabies.  -6/6/16Permethrin has been applied with some improvement -Appears this has been chronic as the patient has numerous excoriations that have been scarred over indicating a chronic process -Repeat permethrin on 07/06/2014   Diabetes mellitus type 2 with renal complications -81/19/1478 hemoglobin A1c 6.2 -Continue NovoLog sliding scale -Discontinue glipizide in the setting of worsening renal failure  History of coronary artery disease/previous history of cardiac arrest/AICD -No concerning dysrhythmia on telemetry. PVCs noted. -November 2015 Myoview negative/low risk, EF 49% -hx of VT arrest -continue ASA 81 mg  History of Liver cirrhosis -Compensated. LFTs are normal. -Hepatitis C antibody positive -Hep C RNA and genotype--pending  Hypertension -Continue amlodipine 10 mg daily -Discontinue hydralazine due to soft blood pressures  -Continue Imdur atypical chest pain -Cycle troponins--neg x 3 -EKG without any ischemic changes  Family Communication: no family present Disposition Plan: Home when medically stable     Procedures/Studies: Dg Chest 2 View  06/29/2014   CLINICAL DATA:  59 year old male with rash and open wounds of unknown origin  EXAM: CHEST  2 VIEW  COMPARISON:  Prior chest x-ray 04/20/2014  FINDINGS: Cardiac and mediastinal contours remain within normal limits. Patient is status post median sternotomy. Left subclavian  approach single lead cardiac rhythm maintenance device. The lead projects over the right ventricle. The lungs are clear. Expiratory phase timing noted incidentally on the lateral view. No evidence of failure. No  acute osseous abnormality.  IMPRESSION: Stable chest x-ray without evidence of acute cardiopulmonary process.   Electronically Signed   By: Jacqulynn Cadet M.D.   On: 06/29/2014 16:21   US Abdomen Complete  06/29/2014   CLINICAL DATA:  History of cirrhosis  EXAM: ULTRASOUND ABDOMEN COMPLETE  COMPARISON:  None.  FINDINGS: Gallbladder: Well distended with multiple gallstones within. No significant gallbladder wall thickening or pericholecystic fluid is noted. A negative sonographic Percell Miller sign is seen.  Common bile duct: Diameter: 6 mm but within normal limits for the patient's given age.  Liver: Mild heterogeneity is noted.  No focal mass lesion is seen.  IVC: No abnormality visualized.  Pancreas: Visualized portion unremarkable.  Spleen: Size and appearance within normal limits.  Right Kidney: Length: 15.3 cm. Echogenicity within normal limits. No mass or hydronephrosis visualized.  Left Kidney:  Not well visualized.  This may be congenitally absent  Abdominal aorta: No aneurysm visualized.  Other findings: None.  IMPRESSION: Cholelithiasis without complicating factors.  Heterogeneity of the liver consistent with the given clinical history. No other focal abnormality is noted.  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   On: 06/29/2014 19:13   Dg Bone Survey Met  07/04/2014   CLINICAL DATA:  Renal failure.  Rule out multiple myeloma.  EXAM: METASTATIC BONE SURVEY  COMPARISON:  None.  FINDINGS: No lytic lesions within the calvarium.  AP views of both shoulders demonstrate no focal lytic lesion.  AP views of both humeri demonstrate no focal lytic lesions.  AP views of both forearms demonstrate no focal lytic lesions.  AP and lateral views of the cervical  spine demonstrate no focal osseous lesion. Nonspecific straightening of expected lordosis.  Two views of the thoracic spine demonstrate obliquity on the lateral view. Grossly maintained vertebral body height, without focal lucent lesion.  Two views of the lumbar spine demonstrate mild convex right curvature. Maintenance of vertebral body height, without focal lesion. Abdominal aortic atherosclerosis.  Frontal view of the chest demonstrates pacer/AICD device. Prior median sternotomy. Midline trachea. Mild cardiomegaly. Mild left hemidiaphragm elevation. Clear lungs. No focal osseous lesion.  AP view of the pelvis and AP views of both femurs. Vascular calcifications. No focal osseous lesion.  AP views of both tibias and fibulas. No focal osseous lesion. Surgical clips about the medial left thigh.  IMPRESSION: No evidence of multiple myeloma.   Electronically Signed   By: Abigail Miyamoto M.D.   On: 07/04/2014 13:29         Subjective: He is feeling well today. He denies any fevers, chills, chest pain, shortness breath, nausea, vomiting, diarrhea. Denies any abdominal pain, hematochezia, melena. Denies any headaches. He is eating well.  Objective: Filed Vitals:   07/04/14 2114 07/05/14 0435 07/05/14 1000 07/05/14 1428  BP: 127/71 129/73 143/81 103/61  Pulse: 68 69 70 61  Temp: 97.8 F (36.6 C) 98.2 F (36.8 C)  97.9 F (36.6 C)  TempSrc: Oral Oral  Oral  Resp: _0 Height:      Weight:  84.324 kg (185 lb 14.4 oz)    SpO2: 99% 98%  99%    Intake/Output Summary (Last 24 hours) at 07/05/14 1634 Last data filed at 07/05/14 1000  Gross per 24 hour  Intake    479 ml  Output    500 ml  Net    -21 ml   Weight change: 1.824 kg (4 lb 0.3 oz) Exam:  General:  Pt is alert, follows commands appropriately, not in acute distress  HEENT: No icterus, No thrush, [place]/AT  Cardiovascular: RRR, S1/S2, no rubs, no gallops  Respiratory: Left basilar crackles. Right clear to  auscultation.  Abdomen: Soft/+BS, non tender, non distended, no guarding  Extremities: 2+LE edema, No lymphangitis, No petechiae, No rashes, no synovitis  Data Reviewed: Basic Metabolic Panel:  Recent Labs Lab 07/01/14 0724 07/02/14 0544 07/03/14 0359 07/04/14 0638 07/05/14 0555  NA 134* 131* 133* 131* 129*  K 4.8 4.6 4.9 4.6 4.6  CL 101 99* 98* 97* 94*  CO2 23 21* 25 24 24  GLUCOSE 80 93 88 120* 80  BUN 69* 75* 79* 80* 85*  CREATININE 3.68* 3.89* 4.41* 3.78* 3.61*  CALCIUM 8.5* 8.1* 8.4* 8.6* 8.3*  PHOS  --  6.4*  --   --  6.0*   Liver Function Tests:  Recent Labs Lab 06/29/14 1414 07/02/14 0544 07/03/14 0359 07/05/14 0555  AST 23  --  22  --   ALT 25  --  26  --   ALKPHOS 62  --  56  --   BILITOT 0.3  --  0.4  --   PROT 8.5*  --  7.8  --   ALBUMIN 2.3* 2.1* 2.1* 2.4*    Recent Labs Lab 07/03/14 0359  LIPASE 27   No results for input(s): AMMONIA in the last 168 hours. CBC:  Recent Labs Lab 06/29/14 1414 07/01/14 0724 07/04/14 0638  WBC 6.7 6.1 5.7  NEUTROABS 4.1  --  3.5  HGB 9.8* 9.2* 9.8*  HCT 29.2* 27.4* 29.6*  MCV 87.7 89.3 88.9  PLT 187 183 203   Cardiac Enzymes:  Recent Labs Lab 06/29/14 1414 06/29/14 2020 07/03/14 1920 07/04/14 0024 07/04/14 0638  CKTOTAL  --  294  --   --   --   TROPONINI 0.03  --  <0.03 <0.03 <0.03   BNP: Invalid input(s): POCBNP CBG:  Recent Labs Lab 07/04/14 0740 07/04/14 1133 07/04/14 1711 07/04/14 2112 07/05/14 0615  GLUCAP 105* 130* 149* 88 87    No results found for this or any previous visit (from the past 240 hour(s)).   Scheduled Meds: . amLODipine  10 mg Oral Daily  . aspirin EC  81 mg Oral Daily  . heparin  5,000 Units Subcutaneous 3 times per day  . insulin aspart  0-9 Units Subcutaneous TID WC  . isosorbide mononitrate  60 mg Oral Daily  . labetalol  200 mg Oral BID  . loratadine  10 mg Oral Daily  . sodium chloride  3 mL Intravenous Q12H  . torsemide  40 mg Oral Daily    Continuous Infusions:    , , DO  [facility] Pager [phone]  If 7PM-7AM, please contact night-coverage [email or web address] Password [email or web address] 07/05/2014, 4:34 PM   LOS: 6 days   

## 2014-07-05 NOTE — Progress Notes (Signed)
Patient having another episode of tightness of his chest, vitals and EKG done were WDL. Md on call paged and ordered a one time dose of 5mg  hydrocodone and requested that patient's BP to be repeated. All orders carried out. Patient made comfortable and being monitored.

## 2014-07-06 LAB — BASIC METABOLIC PANEL
Anion gap: 9 (ref 5–15)
BUN: 88 mg/dL — ABNORMAL HIGH (ref 6–20)
CHLORIDE: 100 mmol/L — AB (ref 101–111)
CO2: 23 mmol/L (ref 22–32)
Calcium: 8.4 mg/dL — ABNORMAL LOW (ref 8.9–10.3)
Creatinine, Ser: 3.53 mg/dL — ABNORMAL HIGH (ref 0.61–1.24)
GFR calc Af Amer: 20 mL/min — ABNORMAL LOW (ref >60)
GFR, EST NON AFRICAN AMERICAN: 17 mL/min — AB (ref >60)
GLUCOSE: 85 mg/dL (ref 65–99)
Potassium: 4.6 mmol/L (ref 3.5–5.1)
Sodium: 132 mmol/L — ABNORMAL LOW (ref 135–145)

## 2014-07-06 LAB — IMMUNOFIXATION ELECTROPHORESIS
IgA: 315 mg/dL (ref 90–386)
IgG (Immunoglobin G), Serum: 3715 mg/dL — ABNORMAL HIGH (ref 700–1600)
IgM, Serum: 96 mg/dL (ref 20–172)
Total Protein ELP: 8 g/dL (ref 6.0–8.5)

## 2014-07-06 LAB — HCV RNA QUANT
HCV Quantitative Log: 5.943 log10 IU/mL (ref >1.70)
HCV Quantitative: 877570 IU/mL (ref >50)

## 2014-07-06 LAB — GLUCOSE, CAPILLARY: GLUCOSE-CAPILLARY: 135 mg/dL — AB (ref 65–99)

## 2014-07-06 LAB — TROPONIN I: Troponin I: <0.03 ng/mL (ref <0.031)

## 2014-07-06 LAB — HEPATITIS C GENOTYPE

## 2014-07-06 MED ORDER — PERMETHRIN 1 % EX LOTN
TOPICAL_LOTION | Freq: Once | CUTANEOUS | Status: AC
  Administered 2014-07-06: 1 via TOPICAL
  Filled 2014-07-06: qty 59

## 2014-07-06 MED ORDER — LABETALOL HCL 200 MG PO TABS: 200.0000 mg | ORAL_TABLET | Freq: Two times a day (BID) | ORAL | Status: DC

## 2014-07-06 MED ORDER — GLIPIZIDE 5 MG PO TABS: 2.5000 mg | ORAL_TABLET | Freq: Every day | ORAL | Status: DC

## 2014-07-06 MED ORDER — TORSEMIDE 20 MG PO TABS: 40.0000 mg | ORAL_TABLET | Freq: Every day | ORAL | Status: DC

## 2014-07-06 NOTE — Progress Notes (Signed)
Patient discharge teaching given, including activity, diet, follow-up appoints, and medications. Patient verbalized understanding of all discharge instructions. IV access was d/c'd. Vitals are stable. Skin is intact except as charted in most recent assessments. Pt to be escorted out by NT, to travel home by bus.

## 2014-07-06 NOTE — Care Management Note (Signed)
Case Management Note  Patient Details  Name: Eric [name] MRN: [id number] Date of Birth: 05/16/1955  Subjective/Objective:       Patient with no needs identified at this time.              Action/Plan:   Expected Discharge Date:                  Expected Discharge Plan:  Home/Self Care  In-House Referral:     Discharge planning Services  CM Consult  Post Acute Care Choice:    Choice offered to:     DME Arranged:    DME Agency:     HH Arranged:    [place] Agency:     Status of Service:  Completed, signed off  Medicare Important Message Given:  No Date Medicare IM Given:    Medicare IM give by:    Date Additional Medicare IM Given:    Additional Medicare Important Message give by:     If discussed at [place] of Stay Meetings, dates discussed:    Additional Comments:  Zenon Mayo, RN 07/06/2014, 1:13 PM

## 2014-07-06 NOTE — Progress Notes (Signed)
Physical Therapy Treatment Patient Details Name: Eric [name] MRN: [id number] DOB: [birth year] Today's Date: 07/06/2014    History of Present Illness Pt is a 59 y/o male with a PMH of CKD, DM, AICD, ischemic cardiomyopathy (EF 45-50% in 11/2013). Pt presents with complaints of a rash in his arms and legs and complains of leg swelling and pain and difficulty walking. He was admitted for further management.     PT Comments    Pt is demonstrating some improved tolerance for activity today, with more independent and safe mobility to stand and maneuver.  His use of RW may not be needed but has one at home.  He is planning to be there with wife who is available 24/7.  Follow Up Recommendations  No PT follow up     Equipment Recommendations  None recommended by PT    Recommendations for Other Services       Precautions / Restrictions Precautions Precautions: Fall Precaution Comments: Possible scabies - head and foot protection required in addition to gown/gloves.  Restrictions Weight Bearing Restrictions: No    Mobility  Bed Mobility               General bed mobility comments: up when PT entered  Transfers Overall transfer level: Modified independent                  Ambulation/Gait Ambulation/Gait assistance: Supervision;Min guard Ambulation Distance (Feet): 150 Feet Assistive device: Rolling walker (2 wheeled);1 person hand held assist   Gait velocity: Decreased Gait velocity interpretation: Below normal speed for age/gender General Gait Details: no difficulty with steps   Stairs            Wheelchair Mobility    Modified Rankin (Stroke Patients Only)       Balance Overall balance assessment: Modified Independent         Standing balance support: Bilateral upper extremity supported;During functional activity Standing balance-Leahy Scale: Fair                      Cognition Arousal/Alertness: Awake/alert Behavior During  Therapy: WFL for tasks assessed/performed Overall Cognitive Status: No family/caregiver present to determine baseline cognitive functioning                      Exercises      General Comments General comments (skin integrity, edema, etc.): Pt sitting with legs dependent and is noted to have edema with pain, but is able to tolerate strength testing      Pertinent Vitals/Pain Pain Assessment: No/denies pain    Home Living                      Prior Function            PT Goals (current goals can now be found in the care plan section) Acute Rehab PT Goals Patient Stated Goal: go home Progress towards PT goals: Progressing toward goals    Frequency  Min 3X/week    PT Plan Current plan remains appropriate    Co-evaluation             End of Session   Activity Tolerance: Patient tolerated treatment well Patient left: in chair;with call bell/phone within reach;with family/visitor present     Time: 1001-1025 PT Time Calculation (min) (ACUTE ONLY): 24 min  Charges:  $Gait Training: 8-22 mins $Therapeutic Exercise: 8-22 mins  G Codes:      Ramond Dial 07/06/2014, 11:11 AM  Mee Hives, PT MS Acute Rehab Dept. Number: [phone] [phone] and [place] [phone]

## 2014-07-07 LAB — UIFE/LIGHT CHAINS/TP QN, 24-HR UR
% BETA, Urine: 16.9 %
ALPHA 1 URINE: 4.7 %
Albumin, U: 61.5 %
Alpha 2, Urine: 3.1 %
FREE LT CHN EXCR RATE: 658 mg/L — AB (ref 1.35–24.19)
Free Kappa/Lambda Ratio: 20.18 — ABNORMAL HIGH (ref 2.04–10.37)
Free Lambda Lt Chains,Ur: 32.6 mg/L — ABNORMAL HIGH (ref 0.24–6.66)
GAMMA GLOBULIN URINE: 13.8 %
M-SPIKE %, Urine: 5.4 % — ABNORMAL HIGH
TIME-UPE24: 24 h
TOTAL PROTEIN, URINE-UPE24: 214.9 mg/dL
Volume, Urine: 3000 mL

## 2014-07-13 LAB — CRYOGLOBULIN

## 2014-07-22 ENCOUNTER — Telehealth: Payer: Self-pay | Admitting: Hematology & Oncology

## 2014-07-22 NOTE — Telephone Encounter (Signed)
Pt called to verify appt and will call back to confirm that he has transportation

## 2014-07-22 NOTE — Telephone Encounter (Signed)
Pt called to confirm appt

## 2014-07-23 ENCOUNTER — Other Ambulatory Visit: Payer: Self-pay | Admitting: *Deleted

## 2014-07-24 ENCOUNTER — Ambulatory Visit (HOSPITAL_BASED_OUTPATIENT_CLINIC_OR_DEPARTMENT_OTHER): Payer: Medicaid Other

## 2014-07-24 ENCOUNTER — Encounter: Payer: Self-pay | Admitting: Hematology & Oncology

## 2014-07-24 ENCOUNTER — Ambulatory Visit (HOSPITAL_BASED_OUTPATIENT_CLINIC_OR_DEPARTMENT_OTHER): Payer: Medicaid Other | Admitting: Hematology & Oncology

## 2014-07-24 VITALS — BP 201/110 | HR 75 | Temp 98.1°F | Resp 20 | Ht 66.0 in | Wt 192.0 lb

## 2014-07-24 DIAGNOSIS — I10 Essential (primary) hypertension: Secondary | ICD-10-CM

## 2014-07-24 DIAGNOSIS — D891 Cryoglobulinemia: Secondary | ICD-10-CM

## 2014-07-24 DIAGNOSIS — B1920 Unspecified viral hepatitis C without hepatic coma: Secondary | ICD-10-CM | POA: Diagnosis not present

## 2014-07-24 DIAGNOSIS — D472 Monoclonal gammopathy: Secondary | ICD-10-CM

## 2014-07-24 LAB — CBC WITH DIFFERENTIAL (CANCER CENTER ONLY)
BASO#: 0 10*3/uL (ref 0.0–0.2)
BASO%: 0.4 % (ref 0.0–2.0)
EOS%: 3.9 % (ref 0.0–7.0)
Eosinophils Absolute: 0.3 10*3/uL (ref 0.0–0.5)
HCT: 28.7 % — ABNORMAL LOW (ref 38.7–49.9)
HEMOGLOBIN: 9.9 g/dL — AB (ref 13.0–17.1)
LYMPH#: 1.5 10*3/uL (ref 0.9–3.3)
LYMPH%: 22.2 % (ref 14.0–48.0)
MCH: 31 pg (ref 28.0–33.4)
MCHC: 34.5 g/dL (ref 32.0–35.9)
MCV: 90 fL (ref 82–98)
MONO#: 0.5 10*3/uL (ref 0.1–0.9)
MONO%: 7.7 % (ref 0.0–13.0)
NEUT#: 4.5 10*3/uL (ref 1.5–6.5)
NEUT%: 65.8 % (ref 40.0–80.0)
Platelets: 206 10*3/uL (ref 145–400)
RBC: 3.19 10*6/uL — ABNORMAL LOW (ref 4.20–5.70)
RDW: 12.4 % (ref 11.1–15.7)
WBC: 6.8 10*3/uL (ref 4.0–10.0)

## 2014-07-24 LAB — RHEUMATOID FACTOR: Rhuematoid fact SerPl-aCnc: <10 IU/mL (ref <=14)

## 2014-07-24 MED ORDER — CLONIDINE HCL 0.2 MG PO TABS: 0.2000 mg | ORAL_TABLET | Freq: Two times a day (BID) | ORAL | Status: DC

## 2014-07-26 NOTE — Progress Notes (Signed)
Hematology and Oncology Follow Up Visit  Eric [name] [id number] 07/09/1955 59 y.o. 07/26/2014   Principle Diagnosis:   MGUS - IgG kappa subtype  Hepatitis C  Worsening renal failure Current Therapy:    Observation     Interim History:  Eric [name] is  is in for his first office visit. We saw him in the hospital back in early June. At that point, he had acute on chronic renal failure. He had a workup. Of course, a SPEP was done. It showed that he had a 0.8 g/dL M spike. This was not further characterized. A immunoglobulin analysis was done. This showed an IgG level of 3715 mg/dL. He had elevations of both And lambda light chains.  He had a 24-hour urine done. This did show 658 mg of Kappa Lightchain. Also noted was some IgG protein.  He had a bone survey done. This did not show any evidence of myelomatous lesions.  He's not yet been treated for hepatitis C. He thinks maybe on this with blood transfusions.  Otherwise he seems to be doing pretty well. He's had no nausea or vomiting. He's had no cough. His been no shortness of breath. He's had no leg swelling. He has some chronic rashes on his lower legs.  Overall, his performance status is ECOG tube.  edications:  Current outpatient prescriptions:  .  aspirin EC 81 MG tablet, Take 81 mg by mouth daily., Disp: , Rfl:  .  labetalol (NORMODYNE) 200 MG tablet, Take 1 tablet (200 mg total) by mouth 2 (two) times daily., Disp: 60 tablet, Rfl: 0 .  UNKNOWN TO PATIENT, 07-24-14 " all my medication was stopped when I left the hospital, except the last one on the list"  Labetalol, Disp: , Rfl:  .  cloNIDine (CATAPRES) 0.2 MG tablet, Take 1 tablet (0.2 mg total) by mouth 2 (two) times daily., Disp: 60 tablet, Rfl: 1  Allergies: No Known Allergies  Past Medical History, Surgical history, Social history, and Family History were reviewed and updated.  Review of Systems:  As above  Physical Exam:  height is 5\' 6"  (1.676 m) and weight is  192 lb (87.091 kg). His oral temperature is 98.1 F (36.7 C). His blood pressure is 201/110 and his pulse is 75. His respiration is 20.   Wt Readings from Last 3 Encounters:  07/24/14 192 lb (87.091 kg)  07/06/14 185 lb 13.6 oz (84.3 kg)  06/12/14 192 lb (87.091 kg)     Fairly well developed well nourished after Eric [name] gentleman. Head and neck exam shows no ocular or oral lesions. He has no palpable cervical or supraclavicular lymph nodes. Lungs are clear. Cardiac exam regular rate and rhythm with no murmurs, rubs or bruits. Abdomen is soft. He has good bowel sounds. There is no fluid wave. There is no palpable liver or spleen tip. Extremities shows some chronic mild edema in his legs. Neurological exam is nonfocal. Skin exam shows some slight stasis dermatitis changes in his legs.  Lab Results  Component Value Date   WBC 6.8 07/24/2014   HGB 9.9* 07/24/2014   HCT 28.7* 07/24/2014   MCV 90 07/24/2014   PLT 206 07/24/2014     Chemistry      Component Value Date/Time   NA 139 07/24/2014 0855   K 4.0 07/24/2014 0855   CL 107 07/24/2014 0855   CO2 20 07/24/2014 0855   BUN 36* 07/24/2014 0855   CREATININE 2.55* 07/24/2014 0855      Component Value  Date/Time   CALCIUM 8.0* 07/24/2014 0855   ALKPHOS 68 07/24/2014 0855   AST 22 07/24/2014 0855   ALT 23 07/24/2014 0855   BILITOT 0.4 07/24/2014 0855         Impression and Plan: Eric [name] is  a 59 year old African-American gentleman. I still have a hard time believing that he actually has myeloma. I think that he has MGUS. I would think that his bone survey would've been positive with myeloma.  I discussed wonder if there is some kind of glomerular disease from his hepatitis C. This certainly would be possible and likely and could account for the light chain production.  We are rechecking his labs. His IgG level was 3003 and 60 mg/dL. He does not have depressed IgA or IgM which would also make myeloma highly unlikely.  I will  plan to give him back to see him in another month or so.  I spent about 30 minutes with him.   Volanda Napoleon, MD 7/3/20168:06 AM

## 2014-07-29 LAB — COMPREHENSIVE METABOLIC PANEL
ALT: 23 U/L (ref 0–53)
AST: 22 U/L (ref 0–37)
Albumin: 2.5 g/dL — ABNORMAL LOW (ref 3.5–5.2)
Alkaline Phosphatase: 68 U/L (ref 39–117)
BUN: 36 mg/dL — AB (ref 6–23)
CO2: 20 mEq/L (ref 19–32)
Calcium: 8 mg/dL — ABNORMAL LOW (ref 8.4–10.5)
Chloride: 107 mEq/L (ref 96–112)
Creatinine, Ser: 2.55 mg/dL — ABNORMAL HIGH (ref 0.50–1.35)
Glucose, Bld: 152 mg/dL — ABNORMAL HIGH (ref 70–99)
POTASSIUM: 4 meq/L (ref 3.5–5.3)
Sodium: 139 mEq/L (ref 135–145)
Total Bilirubin: 0.4 mg/dL (ref 0.2–1.2)
Total Protein: 7.6 g/dL (ref 6.0–8.3)

## 2014-07-29 LAB — LACTATE DEHYDROGENASE: LDH: 168 U/L (ref 94–250)

## 2014-07-29 LAB — IGG, IGA, IGM
IGG (IMMUNOGLOBIN G), SERUM: 3360 mg/dL — AB (ref 650–1600)
IgA: 309 mg/dL (ref 68–379)
IgM, Serum: 92 mg/dL (ref 41–251)

## 2014-07-29 LAB — PROTEIN ELECTROPHORESIS, SERUM, WITH REFLEX
Abnormal Protein Band1: 0.7 g/dL
Albumin ELP: 2.3 g/dL — ABNORMAL LOW (ref 3.8–4.8)
Alpha-1-Globulin: 0.5 g/dL — ABNORMAL HIGH (ref 0.2–0.3)
Alpha-2-Globulin: 1.1 g/dL — ABNORMAL HIGH (ref 0.5–0.9)
Beta 2: 1 g/dL — ABNORMAL HIGH (ref 0.2–0.5)
Beta Globulin: 0.4 g/dL (ref 0.4–0.6)
GAMMA GLOBULIN: 2.3 g/dL — AB (ref 0.8–1.7)
Total Protein, Serum Electrophoresis: 7.6 g/dL (ref 6.1–8.1)

## 2014-07-29 LAB — KAPPA/LAMBDA LIGHT CHAINS
Kappa free light chain: 60.5 mg/dL — ABNORMAL HIGH (ref 0.33–1.94)
Kappa:Lambda Ratio: 5.45 — ABNORMAL HIGH (ref 0.26–1.65)
Lambda Free Lght Chn: 11.1 mg/dL — ABNORMAL HIGH (ref 0.57–2.63)

## 2014-07-29 LAB — IFE INTERPRETATION

## 2014-08-02 ENCOUNTER — Inpatient Hospital Stay (HOSPITAL_COMMUNITY)
Admission: EM | Admit: 2014-08-02 | Discharge: 2014-08-08 | DRG: 291 | Disposition: A | Discharge status: Home / Self Care | Payer: Medicaid Other | Attending: Internal Medicine | Admitting: Internal Medicine

## 2014-08-02 DIAGNOSIS — I255 Ischemic cardiomyopathy: Secondary | ICD-10-CM | POA: Diagnosis present

## 2014-08-02 DIAGNOSIS — R21 Rash and other nonspecific skin eruption: Secondary | ICD-10-CM

## 2014-08-02 DIAGNOSIS — I160 Hypertensive urgency: Secondary | ICD-10-CM

## 2014-08-02 DIAGNOSIS — I252 Old myocardial infarction: Secondary | ICD-10-CM

## 2014-08-02 DIAGNOSIS — E785 Hyperlipidemia, unspecified: Secondary | ICD-10-CM | POA: Diagnosis present

## 2014-08-02 DIAGNOSIS — R609 Edema, unspecified: Secondary | ICD-10-CM

## 2014-08-02 DIAGNOSIS — T502X5A Adverse effect of carbonic-anhydrase inhibitors, benzothiadiazides and other diuretics, initial encounter: Secondary | ICD-10-CM | POA: Diagnosis present

## 2014-08-02 DIAGNOSIS — Z6829 Body mass index (BMI) 29.0-29.9, adult: Secondary | ICD-10-CM

## 2014-08-02 DIAGNOSIS — I10 Essential (primary) hypertension: Secondary | ICD-10-CM

## 2014-08-02 DIAGNOSIS — I472 Ventricular tachycardia: Secondary | ICD-10-CM | POA: Diagnosis present

## 2014-08-02 DIAGNOSIS — D472 Monoclonal gammopathy: Secondary | ICD-10-CM | POA: Diagnosis present

## 2014-08-02 DIAGNOSIS — Z87891 Personal history of nicotine dependence: Secondary | ICD-10-CM

## 2014-08-02 DIAGNOSIS — E669 Obesity, unspecified: Secondary | ICD-10-CM | POA: Diagnosis present

## 2014-08-02 DIAGNOSIS — I129 Hypertensive chronic kidney disease with stage 1 through stage 4 chronic kidney disease, or unspecified chronic kidney disease: Secondary | ICD-10-CM | POA: Diagnosis present

## 2014-08-02 DIAGNOSIS — M79605 Pain in left leg: Secondary | ICD-10-CM

## 2014-08-02 DIAGNOSIS — E1165 Type 2 diabetes mellitus with hyperglycemia: Secondary | ICD-10-CM | POA: Diagnosis present

## 2014-08-02 DIAGNOSIS — H540 Blindness, both eyes: Secondary | ICD-10-CM | POA: Diagnosis present

## 2014-08-02 DIAGNOSIS — N179 Acute kidney failure, unspecified: Secondary | ICD-10-CM

## 2014-08-02 DIAGNOSIS — R079 Chest pain, unspecified: Secondary | ICD-10-CM

## 2014-08-02 DIAGNOSIS — K7460 Unspecified cirrhosis of liver: Secondary | ICD-10-CM | POA: Diagnosis present

## 2014-08-02 DIAGNOSIS — E1140 Type 2 diabetes mellitus with diabetic neuropathy, unspecified: Secondary | ICD-10-CM

## 2014-08-02 DIAGNOSIS — Z95810 Presence of automatic (implantable) cardiac defibrillator: Secondary | ICD-10-CM

## 2014-08-02 DIAGNOSIS — Z951 Presence of aortocoronary bypass graft: Secondary | ICD-10-CM

## 2014-08-02 DIAGNOSIS — R569 Unspecified convulsions: Secondary | ICD-10-CM

## 2014-08-02 DIAGNOSIS — I272 Other secondary pulmonary hypertension: Secondary | ICD-10-CM | POA: Diagnosis present

## 2014-08-02 DIAGNOSIS — M79604 Pain in right leg: Secondary | ICD-10-CM | POA: Diagnosis present

## 2014-08-02 DIAGNOSIS — E1122 Type 2 diabetes mellitus with diabetic chronic kidney disease: Secondary | ICD-10-CM | POA: Diagnosis present

## 2014-08-02 DIAGNOSIS — D891 Cryoglobulinemia: Secondary | ICD-10-CM

## 2014-08-02 DIAGNOSIS — B1920 Unspecified viral hepatitis C without hepatic coma: Secondary | ICD-10-CM | POA: Diagnosis present

## 2014-08-02 DIAGNOSIS — N184 Chronic kidney disease, stage 4 (severe): Secondary | ICD-10-CM | POA: Diagnosis present

## 2014-08-02 DIAGNOSIS — Z9111 Patient's noncompliance with dietary regimen: Secondary | ICD-10-CM | POA: Diagnosis present

## 2014-08-02 DIAGNOSIS — D649 Anemia, unspecified: Secondary | ICD-10-CM | POA: Diagnosis present

## 2014-08-02 DIAGNOSIS — Z8674 Personal history of sudden cardiac arrest: Secondary | ICD-10-CM

## 2014-08-02 DIAGNOSIS — I5043 Acute on chronic combined systolic (congestive) and diastolic (congestive) heart failure: Secondary | ICD-10-CM

## 2014-08-02 DIAGNOSIS — R768 Other specified abnormal immunological findings in serum: Secondary | ICD-10-CM

## 2014-08-02 DIAGNOSIS — J189 Pneumonia, unspecified organism: Secondary | ICD-10-CM

## 2014-08-02 DIAGNOSIS — I469 Cardiac arrest, cause unspecified: Secondary | ICD-10-CM

## 2014-08-02 DIAGNOSIS — N183 Chronic kidney disease, stage 3 unspecified: Secondary | ICD-10-CM | POA: Diagnosis present

## 2014-08-02 DIAGNOSIS — Z9114 Patient's other noncompliance with medication regimen: Secondary | ICD-10-CM | POA: Diagnosis present

## 2014-08-02 DIAGNOSIS — E119 Type 2 diabetes mellitus without complications: Secondary | ICD-10-CM

## 2014-08-02 DIAGNOSIS — J9601 Acute respiratory failure with hypoxia: Secondary | ICD-10-CM

## 2014-08-02 DIAGNOSIS — I4901 Ventricular fibrillation: Secondary | ICD-10-CM

## 2014-08-02 DIAGNOSIS — I2510 Atherosclerotic heart disease of native coronary artery without angina pectoris: Secondary | ICD-10-CM | POA: Diagnosis present

## 2014-08-02 DIAGNOSIS — Z7982 Long term (current) use of aspirin: Secondary | ICD-10-CM

## 2014-08-02 DIAGNOSIS — K7469 Other cirrhosis of liver: Secondary | ICD-10-CM

## 2014-08-02 HISTORY — DX: Reserved for inherently not codable concepts without codable children: IMO0001

## 2014-08-02 HISTORY — DX: Chronic kidney disease, stage 3 (moderate): N18.3

## 2014-08-03 ENCOUNTER — Inpatient Hospital Stay (HOSPITAL_COMMUNITY): Payer: Medicaid Other

## 2014-08-03 ENCOUNTER — Emergency Department (HOSPITAL_COMMUNITY): Payer: Medicaid Other

## 2014-08-03 ENCOUNTER — Encounter (HOSPITAL_COMMUNITY): Payer: Self-pay | Admitting: Emergency Medicine

## 2014-08-03 DIAGNOSIS — Z8674 Personal history of sudden cardiac arrest: Secondary | ICD-10-CM | POA: Diagnosis not present

## 2014-08-03 DIAGNOSIS — E119 Type 2 diabetes mellitus without complications: Secondary | ICD-10-CM

## 2014-08-03 DIAGNOSIS — N184 Chronic kidney disease, stage 4 (severe): Secondary | ICD-10-CM | POA: Diagnosis present

## 2014-08-03 DIAGNOSIS — I10 Essential (primary) hypertension: Secondary | ICD-10-CM | POA: Diagnosis not present

## 2014-08-03 DIAGNOSIS — Z951 Presence of aortocoronary bypass graft: Secondary | ICD-10-CM | POA: Diagnosis not present

## 2014-08-03 DIAGNOSIS — I5043 Acute on chronic combined systolic (congestive) and diastolic (congestive) heart failure: Secondary | ICD-10-CM | POA: Diagnosis present

## 2014-08-03 DIAGNOSIS — I472 Ventricular tachycardia: Secondary | ICD-10-CM | POA: Diagnosis present

## 2014-08-03 DIAGNOSIS — R0602 Shortness of breath: Secondary | ICD-10-CM | POA: Diagnosis not present

## 2014-08-03 DIAGNOSIS — E1122 Type 2 diabetes mellitus with diabetic chronic kidney disease: Secondary | ICD-10-CM | POA: Diagnosis present

## 2014-08-03 DIAGNOSIS — Z6829 Body mass index (BMI) 29.0-29.9, adult: Secondary | ICD-10-CM | POA: Diagnosis not present

## 2014-08-03 DIAGNOSIS — N183 Chronic kidney disease, stage 3 (moderate): Secondary | ICD-10-CM

## 2014-08-03 DIAGNOSIS — M79604 Pain in right leg: Secondary | ICD-10-CM | POA: Diagnosis present

## 2014-08-03 DIAGNOSIS — E785 Hyperlipidemia, unspecified: Secondary | ICD-10-CM | POA: Diagnosis present

## 2014-08-03 DIAGNOSIS — I255 Ischemic cardiomyopathy: Secondary | ICD-10-CM

## 2014-08-03 DIAGNOSIS — E669 Obesity, unspecified: Secondary | ICD-10-CM | POA: Diagnosis present

## 2014-08-03 DIAGNOSIS — I509 Heart failure, unspecified: Secondary | ICD-10-CM

## 2014-08-03 DIAGNOSIS — D649 Anemia, unspecified: Secondary | ICD-10-CM | POA: Diagnosis present

## 2014-08-03 DIAGNOSIS — Z7982 Long term (current) use of aspirin: Secondary | ICD-10-CM | POA: Diagnosis not present

## 2014-08-03 DIAGNOSIS — H540 Blindness, both eyes: Secondary | ICD-10-CM | POA: Diagnosis present

## 2014-08-03 DIAGNOSIS — I2510 Atherosclerotic heart disease of native coronary artery without angina pectoris: Secondary | ICD-10-CM | POA: Diagnosis present

## 2014-08-03 DIAGNOSIS — E1165 Type 2 diabetes mellitus with hyperglycemia: Secondary | ICD-10-CM | POA: Diagnosis present

## 2014-08-03 DIAGNOSIS — Z87891 Personal history of nicotine dependence: Secondary | ICD-10-CM | POA: Diagnosis not present

## 2014-08-03 DIAGNOSIS — R609 Edema, unspecified: Secondary | ICD-10-CM | POA: Diagnosis not present

## 2014-08-03 DIAGNOSIS — Z95810 Presence of automatic (implantable) cardiac defibrillator: Secondary | ICD-10-CM | POA: Diagnosis not present

## 2014-08-03 DIAGNOSIS — K7460 Unspecified cirrhosis of liver: Secondary | ICD-10-CM | POA: Diagnosis present

## 2014-08-03 DIAGNOSIS — I129 Hypertensive chronic kidney disease with stage 1 through stage 4 chronic kidney disease, or unspecified chronic kidney disease: Secondary | ICD-10-CM | POA: Diagnosis present

## 2014-08-03 DIAGNOSIS — Z9111 Patient's noncompliance with dietary regimen: Secondary | ICD-10-CM | POA: Diagnosis present

## 2014-08-03 DIAGNOSIS — B1920 Unspecified viral hepatitis C without hepatic coma: Secondary | ICD-10-CM | POA: Diagnosis present

## 2014-08-03 DIAGNOSIS — E1140 Type 2 diabetes mellitus with diabetic neuropathy, unspecified: Secondary | ICD-10-CM

## 2014-08-03 DIAGNOSIS — I252 Old myocardial infarction: Secondary | ICD-10-CM | POA: Diagnosis not present

## 2014-08-03 DIAGNOSIS — N179 Acute kidney failure, unspecified: Secondary | ICD-10-CM | POA: Diagnosis present

## 2014-08-03 DIAGNOSIS — T502X5A Adverse effect of carbonic-anhydrase inhibitors, benzothiadiazides and other diuretics, initial encounter: Secondary | ICD-10-CM | POA: Diagnosis present

## 2014-08-03 DIAGNOSIS — I272 Other secondary pulmonary hypertension: Secondary | ICD-10-CM | POA: Diagnosis present

## 2014-08-03 DIAGNOSIS — I160 Hypertensive urgency: Secondary | ICD-10-CM | POA: Diagnosis present

## 2014-08-03 DIAGNOSIS — M79605 Pain in left leg: Secondary | ICD-10-CM

## 2014-08-03 DIAGNOSIS — D472 Monoclonal gammopathy: Secondary | ICD-10-CM | POA: Diagnosis present

## 2014-08-03 DIAGNOSIS — J9601 Acute respiratory failure with hypoxia: Secondary | ICD-10-CM | POA: Diagnosis present

## 2014-08-03 DIAGNOSIS — Z9114 Patient's other noncompliance with medication regimen: Secondary | ICD-10-CM | POA: Diagnosis present

## 2014-08-03 LAB — CBC
HCT: 29.2 % — ABNORMAL LOW (ref 39.0–52.0)
Hemoglobin: 10.1 g/dL — ABNORMAL LOW (ref 13.0–17.0)
MCH: 30.2 pg (ref 26.0–34.0)
MCHC: 34.6 g/dL (ref 30.0–36.0)
MCV: 87.4 fL (ref 78.0–100.0)
Platelets: 198 10*3/uL (ref 150–400)
RBC: 3.34 MIL/uL — ABNORMAL LOW (ref 4.22–5.81)
RDW: 13.4 % (ref 11.5–15.5)
WBC: 9.5 10*3/uL (ref 4.0–10.5)

## 2014-08-03 LAB — BASIC METABOLIC PANEL
ANION GAP: 6 (ref 5–15)
BUN: 43 mg/dL — ABNORMAL HIGH (ref 6–20)
CO2: 22 mmol/L (ref 22–32)
Calcium: 8.2 mg/dL — ABNORMAL LOW (ref 8.9–10.3)
Chloride: 108 mmol/L (ref 101–111)
Creatinine, Ser: 2.97 mg/dL — ABNORMAL HIGH (ref 0.61–1.24)
GFR calc Af Amer: 25 mL/min — ABNORMAL LOW (ref >60)
GFR calc non Af Amer: 21 mL/min — ABNORMAL LOW (ref >60)
GLUCOSE: 89 mg/dL (ref 65–99)
POTASSIUM: 4.3 mmol/L (ref 3.5–5.1)
SODIUM: 136 mmol/L (ref 135–145)

## 2014-08-03 LAB — URINALYSIS, ROUTINE W REFLEX MICROSCOPIC
Bilirubin Urine: NEGATIVE
Glucose, UA: NEGATIVE mg/dL
Ketones, ur: NEGATIVE mg/dL
Leukocytes, UA: NEGATIVE
NITRITE: NEGATIVE
Specific Gravity, Urine: 1.012 (ref 1.005–1.030)
Urobilinogen, UA: 1 mg/dL (ref 0.0–1.0)
pH: 5.5 (ref 5.0–8.0)

## 2014-08-03 LAB — COMPREHENSIVE METABOLIC PANEL
ALT: 24 U/L (ref 17–63)
ANION GAP: 7 (ref 5–15)
AST: 27 U/L (ref 15–41)
Albumin: 2.1 g/dL — ABNORMAL LOW (ref 3.5–5.0)
Alkaline Phosphatase: 69 U/L (ref 38–126)
BILIRUBIN TOTAL: 0.5 mg/dL (ref 0.3–1.2)
BUN: 44 mg/dL — ABNORMAL HIGH (ref 6–20)
CO2: 21 mmol/L — AB (ref 22–32)
CREATININE: 2.9 mg/dL — AB (ref 0.61–1.24)
Calcium: 8.4 mg/dL — ABNORMAL LOW (ref 8.9–10.3)
Chloride: 109 mmol/L (ref 101–111)
GFR calc Af Amer: 26 mL/min — ABNORMAL LOW (ref >60)
GFR, EST NON AFRICAN AMERICAN: 22 mL/min — AB (ref >60)
Glucose, Bld: 132 mg/dL — ABNORMAL HIGH (ref 65–99)
Potassium: 4.2 mmol/L (ref 3.5–5.1)
SODIUM: 137 mmol/L (ref 135–145)
TOTAL PROTEIN: 8.1 g/dL (ref 6.5–8.1)

## 2014-08-03 LAB — CBC WITH DIFFERENTIAL/PLATELET
BASOS PCT: 0 % (ref 0–1)
Basophils Absolute: 0 10*3/uL (ref 0.0–0.1)
EOS PCT: 2 % (ref 0–5)
Eosinophils Absolute: 0.1 10*3/uL (ref 0.0–0.7)
HCT: 29.5 % — ABNORMAL LOW (ref 39.0–52.0)
HEMOGLOBIN: 9.9 g/dL — AB (ref 13.0–17.0)
LYMPHS ABS: 1.9 10*3/uL (ref 0.7–4.0)
LYMPHS PCT: 23 % (ref 12–46)
MCH: 29.5 pg (ref 26.0–34.0)
MCHC: 33.6 g/dL (ref 30.0–36.0)
MCV: 87.8 fL (ref 78.0–100.0)
MONO ABS: 0.6 10*3/uL (ref 0.1–1.0)
Monocytes Relative: 8 % (ref 3–12)
NEUTROS PCT: 67 % (ref 43–77)
Neutro Abs: 5.5 10*3/uL (ref 1.7–7.7)
Platelets: 195 10*3/uL (ref 150–400)
RBC: 3.36 MIL/uL — ABNORMAL LOW (ref 4.22–5.81)
RDW: 13.4 % (ref 11.5–15.5)
WBC: 8.2 10*3/uL (ref 4.0–10.5)

## 2014-08-03 LAB — BRAIN NATRIURETIC PEPTIDE: B NATRIURETIC PEPTIDE 5: 952.6 pg/mL — AB (ref 0.0–100.0)

## 2014-08-03 LAB — URINE MICROSCOPIC-ADD ON

## 2014-08-03 LAB — TROPONIN I
TROPONIN I: 0.07 ng/mL — AB (ref <0.031)
TROPONIN I: 0.07 ng/mL — AB (ref <0.031)
Troponin I: 0.08 ng/mL — ABNORMAL HIGH (ref <0.031)

## 2014-08-03 LAB — RAPID URINE DRUG SCREEN, HOSP PERFORMED
AMPHETAMINES: NOT DETECTED
BENZODIAZEPINES: NOT DETECTED
Barbiturates: NOT DETECTED
Cocaine: NOT DETECTED
OPIATES: NOT DETECTED
TETRAHYDROCANNABINOL: NOT DETECTED

## 2014-08-03 LAB — TSH: TSH: 2.421 u[IU]/mL (ref 0.350–4.500)

## 2014-08-03 LAB — I-STAT TROPONIN, ED: Troponin i, poc: 0.08 ng/mL (ref 0.00–0.08)

## 2014-08-03 LAB — GLUCOSE, CAPILLARY
GLUCOSE-CAPILLARY: 159 mg/dL — AB (ref 65–99)
Glucose-Capillary: 86 mg/dL (ref 65–99)
Glucose-Capillary: 144 mg/dL — ABNORMAL HIGH (ref 65–99)
Glucose-Capillary: 144 mg/dL — ABNORMAL HIGH (ref 65–99)

## 2014-08-03 LAB — MAGNESIUM: MAGNESIUM: 2 mg/dL (ref 1.7–2.4)

## 2014-08-03 MED ORDER — ASPIRIN EC 81 MG PO TBEC
81.0000 mg | DELAYED_RELEASE_TABLET | Freq: Every day | ORAL | Status: DC
  Administered 2014-08-03 – 2014-08-08 (×6): 81 mg via ORAL
  Filled 2014-08-03 (×6): qty 1

## 2014-08-03 MED ORDER — ONDANSETRON HCL 4 MG PO TABS: 4.0000 mg | ORAL_TABLET | Freq: Four times a day (QID) | ORAL | Status: DC | PRN

## 2014-08-03 MED ORDER — MORPHINE SULFATE 2 MG/ML IJ SOLN
1.0000 mg | INTRAMUSCULAR | Status: DC | PRN
  Administered 2014-08-03 – 2014-08-05 (×3): 1 mg via INTRAVENOUS
  Filled 2014-08-03 (×3): qty 1

## 2014-08-03 MED ORDER — ACETAMINOPHEN 325 MG PO TABS
650.0000 mg | ORAL_TABLET | Freq: Four times a day (QID) | ORAL | Status: DC | PRN
  Administered 2014-08-03 – 2014-08-07 (×4): 650 mg via ORAL
  Filled 2014-08-03 (×4): qty 2

## 2014-08-03 MED ORDER — HYDROMORPHONE HCL 1 MG/ML IJ SOLN
1.0000 mg | Freq: Once | INTRAMUSCULAR | Status: AC
  Administered 2014-08-03: 1 mg via INTRAVENOUS
  Filled 2014-08-03: qty 1

## 2014-08-03 MED ORDER — CETYLPYRIDINIUM CHLORIDE 0.05 % MT LIQD
7.0000 mL | Freq: Two times a day (BID) | OROMUCOSAL | Status: DC
  Administered 2014-08-03 – 2014-08-08 (×11): 7 mL via OROMUCOSAL

## 2014-08-03 MED ORDER — FUROSEMIDE 10 MG/ML IJ SOLN
60.0000 mg | Freq: Once | INTRAMUSCULAR | Status: AC
  Administered 2014-08-03: 60 mg via INTRAVENOUS
  Filled 2014-08-03: qty 6

## 2014-08-03 MED ORDER — NICARDIPINE HCL IN NACL 20-0.86 MG/200ML-% IV SOLN
3.0000 mg/h | Freq: Once | INTRAVENOUS | Status: AC
  Administered 2014-08-03: 3 mg/h via INTRAVENOUS
  Filled 2014-08-03: qty 200

## 2014-08-03 MED ORDER — SODIUM CHLORIDE 0.9 % IJ SOLN
3.0000 mL | Freq: Two times a day (BID) | INTRAMUSCULAR | Status: DC
  Administered 2014-08-03 – 2014-08-08 (×11): 3 mL via INTRAVENOUS

## 2014-08-03 MED ORDER — AMLODIPINE BESYLATE 10 MG PO TABS
10.0000 mg | ORAL_TABLET | Freq: Every day | ORAL | Status: DC
  Administered 2014-08-03 – 2014-08-08 (×6): 10 mg via ORAL
  Filled 2014-08-03 (×6): qty 1

## 2014-08-03 MED ORDER — CLONIDINE HCL 0.2 MG PO TABS
0.2000 mg | ORAL_TABLET | Freq: Three times a day (TID) | ORAL | Status: DC
  Administered 2014-08-03: 0.2 mg via ORAL
  Filled 2014-08-03 (×4): qty 1

## 2014-08-03 MED ORDER — ISOSORBIDE MONONITRATE ER 30 MG PO TB24
30.0000 mg | ORAL_TABLET | Freq: Every day | ORAL | Status: DC
  Administered 2014-08-03 – 2014-08-08 (×6): 30 mg via ORAL
  Filled 2014-08-03 (×6): qty 1

## 2014-08-03 MED ORDER — HYDRALAZINE HCL 25 MG PO TABS
25.0000 mg | ORAL_TABLET | Freq: Three times a day (TID) | ORAL | Status: DC
  Administered 2014-08-03 – 2014-08-08 (×16): 25 mg via ORAL
  Filled 2014-08-03 (×19): qty 1

## 2014-08-03 MED ORDER — HYDRALAZINE HCL 20 MG/ML IJ SOLN
10.0000 mg | INTRAMUSCULAR | Status: DC | PRN
  Administered 2014-08-04 (×2): 10 mg via INTRAVENOUS
  Filled 2014-08-03 (×2): qty 1

## 2014-08-03 MED ORDER — FUROSEMIDE 10 MG/ML IJ SOLN
60.0000 mg | Freq: Two times a day (BID) | INTRAMUSCULAR | Status: DC
Start: 2014-08-03 — End: 2014-08-04
  Administered 2014-08-03 – 2014-08-04 (×3): 60 mg via INTRAVENOUS
  Filled 2014-08-03 (×5): qty 6

## 2014-08-03 MED ORDER — INSULIN ASPART 100 UNIT/ML ~~LOC~~ SOLN
0.0000 [IU] | Freq: Three times a day (TID) | SUBCUTANEOUS | Status: DC
  Administered 2014-08-03 – 2014-08-06 (×8): 1 [IU] via SUBCUTANEOUS

## 2014-08-03 MED ORDER — ENOXAPARIN SODIUM 30 MG/0.3ML ~~LOC~~ SOLN
30.0000 mg | Freq: Every day | SUBCUTANEOUS | Status: DC
  Administered 2014-08-03 – 2014-08-08 (×6): 30 mg via SUBCUTANEOUS
  Filled 2014-08-03 (×6): qty 0.3

## 2014-08-03 MED ORDER — GLIPIZIDE 5 MG PO TABS
5.0000 mg | ORAL_TABLET | Freq: Every day | ORAL | Status: DC
  Administered 2014-08-04 – 2014-08-08 (×5): 5 mg via ORAL
  Filled 2014-08-03 (×7): qty 1

## 2014-08-03 MED ORDER — CLONIDINE HCL 0.3 MG PO TABS
0.3000 mg | ORAL_TABLET | Freq: Three times a day (TID) | ORAL | Status: DC
  Administered 2014-08-03 – 2014-08-06 (×9): 0.3 mg via ORAL
  Filled 2014-08-03 (×12): qty 1

## 2014-08-03 MED ORDER — ACETAMINOPHEN 650 MG RE SUPP: 650.0000 mg | Freq: Four times a day (QID) | RECTAL | Status: DC | PRN

## 2014-08-03 MED ORDER — ONDANSETRON HCL 4 MG/2ML IJ SOLN: 4.0000 mg | Freq: Four times a day (QID) | INTRAMUSCULAR | Status: DC | PRN

## 2014-08-03 NOTE — Progress Notes (Signed)
Informed infection prevention nurse made aware  of pt's skin condition on last  Last adm. 06/29/14.   Pt with generalized old dry sores on his body.  Pt said he was treated for it last admission but is not scabies.  Reviewing pt's chart informed nurse that is a chronic skin rash and does not need to be on isolation.  Will continue to monitor.  Karie Kirks, Therapist, sports.

## 2014-08-03 NOTE — Progress Notes (Signed)
PROGRESS NOTE  Eric [name] [id number] DOB: February 20, 1955 DOA: 08/02/2014 PCP: [name], NP  HPI/Recap of past 24 hours: 41 oh male with past mental history of chronic systolic/diastolic heart failure, chronic kidney disease stage III, hepatitis C cirrhosis and diabetes mellitus admitted on early morning of 7/11 for acute respiratory failure secondary to CHF. Is also found to have markedly elevated blood pressures with systolic greater than [id number].  Patient admits to being noncompliant with his medications and says because his kidney function, he was told to stop them. Started on IV Lasixplus other medications.  This morning, patient complains of bilateral leg pain. States breathing is still rough  Assessment/Plan: Principal Problem:   Acute respiratory failure with hypoxia secondary to Acute on chronic combined systolic and diastolic CHF (congestive heart failure) from ischemic cardiomyopathy: Continue aggressive Lasix. Supplemental oxygen. Echocardiogram done last month notes EF of 45% and grade 1 diastolic heart failure.    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 Appears stable. We'll confirm patient has close follow-up   CKD (chronic kidney disease) stage 3, GFR 30-59 ml/min: Appears to be at baseline   Hypertensive urgency: Scheduled hydralazine, Imdur and clonidine. Holding off on beta blocker until heart failure less decompensated. No Ace or ARB secondary to renal failure   Diabetes mellitus type 2, controlled, continue sliding scale coverage   Obesity (BMI 30-39.9): Patient meets criteria with BMI > 30   Bilateral leg pain: Checking lower extremity Doppler  ? Drug [id number] has a large number of markings all over his body which look like evidence of skin popping.awaiting urine drug screen.  Code Status: Full  Family Communication: No   Disposition Plan: continue inpatient until blood pressure well-controlled, fully  diuresed   Consultants:  None  Procedures:  None  Antibiotics:  None   Objective: BP 192/102 mmHg  Pulse 94  Temp(Src) 98.7 F (37.1 C) (Oral)  Resp 20  Ht 5\' 6"  (1.676 m)  Wt 89.086 kg (196 lb 6.4 oz)  BMI 31.71 kg/m2  SpO2 98%  Intake/Output Summary (Last 24 hours) at 08/03/14 1222 Last data filed at 08/03/14 1217  Gross per 24 hour  Intake    680 ml  Output   2060 ml  Net  -1380 ml   Filed Weights   08/03/14 0637  Weight: 89.086 kg (196 lb 6.4 oz)    Exam:   General:  Alert and oriented 3, moderate distress secondary to pain  Cardiovascular: regular rhythm, borderline tachycardia, 2/6 systolic ejection murmur  Respiratory: decreased breath sounds bibasilar  Abdomen: soft, nontender, nondistended, positive bowel sounds  Musculoskeletal: 1+ pitting edema bilaterally   Data Reviewed: Basic Metabolic Panel:  Recent Labs Lab 08/03/14 0011 08/03/14 0843  NA 136 137  K 4.3 4.2  CL 108 109  CO2 22 21*  GLUCOSE 89 132*  BUN 43* 44*  CREATININE 2.97* 2.90*  CALCIUM 8.2* 8.4*  MG  --  2.0   Liver Function Tests:  Recent Labs Lab 08/03/14 0843  AST 27  ALT 24  ALKPHOS 69  BILITOT 0.5  PROT 8.1  ALBUMIN 2.1*   No results for input(s): LIPASE, AMYLASE in the last 168 hours. No results for input(s): AMMONIA in the last 168 hours. CBC:  Recent Labs Lab 08/03/14 0011 08/03/14 0843  WBC 9.5 8.2  NEUTROABS  --  5.5  HGB 10.1* 9.9*  HCT 29.2* 29.5*  MCV 87.4 87.8  PLT 198 195   Cardiac Enzymes:    Recent Labs Lab 08/03/14  0843  TROPONINI 0.08*   BNP (last 3 results)  Recent Labs  04/20/14 1125 06/29/14 1414 08/03/14 0012  BNP 125.3* 199.9* 952.6*    ProBNP (last 3 results)  Recent Labs  11/26/13 1300  PROBNP 452.5*    CBG:  Recent Labs Lab 08/03/14 0642 08/03/14 1113  GLUCAP 86 144*    No results found for this or any previous visit (from the past 240 hour(s)).   Studies: Dg Chest Port 1  View  08/03/2014   CLINICAL DATA:  60 year old male with shortness of breath  EXAM: PORTABLE CHEST - 1 VIEW  COMPARISON:  Chest radiograph dated 06/29/2014  FINDINGS: Single-view of the chest demonstrate prominence of the central pulmonary vessels with increased density in the lower lung fields compatible with congestive heart failure. There are small bilateral pleural effusions. Stable cardiac silhouette. Left pectoral pacemaker device and median sternotomy wires noted.  IMPRESSION: Increased interstitial and central vascular prominence compatible with CHF. Superimposed pneumonia is not excluded. Clinical correlation and follow-up recommended.  Small bilateral pleural effusions.   Electronically Signed   By: Anner Crete M.D.   On: 08/03/2014 00:47    Scheduled Meds: . amLODipine  10 mg Oral Daily  . antiseptic oral rinse  7 mL Mouth Rinse BID  . aspirin EC  81 mg Oral Daily  . cloNIDine  0.3 mg Oral 3 times per day  . enoxaparin (LOVENOX) injection  30 mg Subcutaneous Daily  . furosemide  60 mg Intravenous Q12H  . glipiZIDE  5 mg Oral QAC breakfast  . hydrALAZINE  25 mg Oral 3 times per day  . insulin aspart  0-9 Units Subcutaneous TID WC  . isosorbide mononitrate  30 mg Oral Daily  . sodium chloride  3 mL Intravenous Q12H    Continuous Infusions:    Time spent: 25  min  [facility] Pager [phone]. If 7PM-7AM, please contact night-coverage at [email or web address], password [facility] 08/03/2014, 12:22 PM  LOS: 0 days

## 2014-08-03 NOTE — ED Provider Notes (Signed)
CSN: [id number]     Arrival date & time 08/02/14  2347 History  This chart was scribed for Jola Schmidt, MD by Julien Nordmann, ED Scribe. This patient was seen in room A05C/A05C and the patient's care was started at 12:32 AM.     Chief Complaint  Patient presents with  . Chest Pain  . Shortness of Breath      The history is provided by the patient. No language interpreter was used.   HPI Comments: Eric [name] is a 59 y.o. male who has a hx of CABG, HTN, MI, and CHF presents to the Emergency Department complaining of intermittent, gradual worsening shortness of breath onset this morning. He has associated chest pain, bilateral leg swelling that have been swollen for awhile. Pt notes he has difficulty breathing when he lays down. He reports difficulty ambulating due to shortness of breath and leg swelling. Per wife, pt was seen for the same symptoms about 3 weeks ago and notes it was pneumonia. He stopped going to his cardiologist due to being put on medication that was not alleviating his symptoms. Wife believe pt is has worsening symptoms due to being put on clonidine 4 days ago.  PCP: Dr. Oletta Lamas.  Past Medical History  Diagnosis Date  . Hypertension   . Hyperlipemia   . Edema   . 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 Obesity   . Ventricular tachycardia   . AICD (automatic cardioverter/defibrillator) present   . CHF (congestive heart failure)   . Myocardial infarction 03/2013  . Pneumonia 2016  . Type II diabetes mellitus   . History of blood transfusion 1950's    "related to pinal menigitis; HAD 16 OPERATIONS TOTAL"  . Scabies infestation 06/29/2014   Past Surgical History  Procedure Laterality Date  . Implantable cardioverter defibrillator implant  03/24/2013    STJ single chamber ICD implanted by Dr Caryl Comes for secondary prevention  . Left heart catheterization with coronary/graft angiogram  03/07/2013    Procedure: LEFT HEART CATHETERIZATION WITH  [name];  Surgeon: Clent Demark, MD;  Location: [facility];  Service: Cardiovascular;;  . Implantable cardioverter defibrillator implant N/A 03/24/2013    Procedure: IMPLANTABLE CARDIOVERTER DEFIBRILLATOR IMPLANT;  Surgeon: Deboraha Sprang, MD;  Location: [facility] CATH LAB;  Service: Cardiovascular;  Laterality: N/A;  . Cardiac catheterization    . Head surgery  1950'S    "for spinal menigitis; HAD 16 OPERATIONS TOTAL"  . Back surgery  1950's    "for spinal menigitis; HAD 16 OPERATIONS TOTAL"  . 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 Coronary artery bypass graft  1999    cabg x4   Family History  Problem Relation Age of Onset  . Heart disease Mother   . Diabetes Mother    History  Substance Use Topics  . Smoking status: Former Smoker -- 1.00 packs/day for 5 years    Types: Cigarettes  . Smokeless tobacco: Never Used     Comment: "quit smoking in the 1960's"  . Alcohol Use: No    Review of Systems  A complete 10 system review of systems was obtained and all systems are negative except as noted in the HPI and PMH.    Allergies  Review of patient's allergies indicates no known allergies.  Home Medications   Prior to Admission medications   Medication Sig Start Date End Date Taking? Authorizing Provider  aspirin EC 81 MG tablet  Take 81 mg by mouth daily.    Historical Provider, MD  cloNIDine (CATAPRES) 0.2 MG tablet Take 1 tablet (0.2 mg total) by mouth 2 (two) times daily. 07/24/14   Volanda Napoleon, MD  labetalol (NORMODYNE) 200 MG tablet Take 1 tablet (200 mg total) by mouth 2 (two) times daily. 07/06/14   Orson Eva, MD  UNKNOWN TO PATIENT 07-24-14 " all my medication was stopped when I left the hospital, except the last one on the list"  Labetalol    Historical Provider, MD   Triage vitals: BP 197/111 mmHg  Pulse 100  Temp(Src) 98.2 F (36.8 C) (Oral)  Resp 28  SpO2 98% Physical Exam  Constitutional: He is oriented to  person, place, and time. He appears well-developed and well-nourished.  HENT:  Head: Normocephalic and atraumatic.  Eyes: EOM are normal.  Neck: Normal range of motion.  Cardiovascular: Normal rate, regular rhythm, normal heart sounds and intact distal pulses.   Pulmonary/Chest: Effort normal. Tachypnea noted. No respiratory distress. He has rales.  Rales bilaterally  Abdominal: Soft. He exhibits no distension. There is no tenderness.  Musculoskeletal: Normal range of motion. He exhibits edema.  2+ edema bilaterally  Neurological: He is alert and oriented to person, place, and time.  Skin: Skin is warm and dry.  Psychiatric: He has a normal mood and affect. Judgment normal.  Nursing note and vitals reviewed.   ED Course  Procedures  DIAGNOSTIC STUDIES: Oxygen Saturation is 98% on 2 min/L [place], normal by my interpretation.  COORDINATION OF CARE:  12:39 AM Discussed treatment plan which includes EKG, lab work, DG chest with pt at bedside and pt agreed to plan.  Labs Review Labs Reviewed  CBC - Abnormal; Notable for the following:    RBC 3.34 (*)    Hemoglobin 10.1 (*)    HCT 29.2 (*)    All other components within normal limits  BASIC METABOLIC PANEL - Abnormal; Notable for the following:    BUN 43 (*)    Creatinine, Ser 2.97 (*)    Calcium 8.2 (*)    GFR calc non Af Amer 21 (*)    GFR calc Af Amer 25 (*)    All other components within normal limits  BRAIN NATRIURETIC PEPTIDE - Abnormal; Notable for the following:    B Natriuretic Peptide 952.6 (*)    All other components within normal limits  I-STAT TROPOININ, ED   BUN  Date Value Ref Range Status  08/03/2014 43* 6 - 20 mg/dL Final  07/24/2014 36* 6 - 23 mg/dL Final  07/06/2014 88* 6 - 20 mg/dL Final  07/05/2014 85* 6 - 20 mg/dL Final   CREATININE, SER  Date Value Ref Range Status  08/03/2014 2.97* 0.61 - 1.24 mg/dL Final  07/24/2014 2.55* 0.50 - 1.35 mg/dL Final  07/06/2014 3.53* 0.61 - 1.24 mg/dL Final   07/05/2014 3.61* 0.61 - 1.24 mg/dL Final       Imaging Review Dg Chest Port 1 View  08/03/2014   CLINICAL DATA:  59 year old male with shortness of breath  EXAM: PORTABLE CHEST - 1 VIEW  COMPARISON:  Chest radiograph dated 06/29/2014  FINDINGS: Single-view of the chest demonstrate prominence of the central pulmonary vessels with increased density in the lower lung fields compatible with congestive heart failure. There are small bilateral pleural effusions. Stable cardiac silhouette. Left pectoral pacemaker device and median sternotomy wires noted.  IMPRESSION: Increased interstitial and central vascular prominence compatible with CHF. Superimposed pneumonia is not excluded. Clinical  correlation and follow-up recommended.  Small bilateral pleural effusions.   Electronically Signed   By: Anner Crete M.D.   On: 08/03/2014 00:47  I personally reviewed the imaging tests through PACS system I reviewed available ER/hospitalization records through the EMR    EKG Interpretation   Date/Time:  Monday August 03 2014 00:02:43 EDT Ventricular Rate:  98 PR Interval:  137 QRS Duration: 77 QT Interval:  351 QTC Calculation: 448 R Axis:   42 Text Interpretation:  Sinus rhythm Probable left atrial enlargement  Probable anteroseptal infarct, old Borderline repolarization abnormality  No significant change was found Confirmed by Ivyana Locey  MD, Laylamarie Meuser ([id number]) on  08/03/2014 12:18:01 AM      MDM   Final diagnoses:  Acute on chronic combined systolic and diastolic CHF (congestive heart failure)  CKD (chronic kidney disease) stage 3, GFR 30-59 ml/min    Patient with increased work of breathing.  Bilateral rales present on examination.  Chest x-ray consistent with volume overload.  Patient will begin diuresis at this time.  He is also hypertensive and this may represent some degree of hypertensive emergency given his blood pressure on arrival of 197/111.  Initially I began a Cardene drip however with pain  medication and diuresis the patient's blood pressure improved.  We'll hold Cardene at this time.  Baseline renal insufficiency for the patient   I personally performed the services described in this documentation, which was scribed in my presence. The recorded information has been reviewed and is accurate.     Jola Schmidt, MD 08/03/14 [phone]

## 2014-08-03 NOTE — ED Notes (Signed)
Pt from home with CP and SHOB ongoing for three days. HTN 206/100. Open heart surgery hx. States worsening SHOB if sitting lower than 90 degrees upright.

## 2014-08-03 NOTE — ED Notes (Signed)
Attempted to call report

## 2014-08-03 NOTE — Progress Notes (Signed)
Lab called. Urinalysis and drug screen could not be completed due to missing label on pt sample. Order resubmitted and sent.

## 2014-08-03 NOTE — Progress Notes (Signed)
VASCULAR LAB PRELIMINARY  PRELIMINARY  PRELIMINARY  PRELIMINARY  Bilateral lower extremity venous duplex completed.    Preliminary report:  Bilateral:  No evidence of DVT, superficial thrombosis, or Baker's Cyst. Mild to moderate interstitial fluid noted throughout the right and left lower extremity with the greatest amount of fluid being in the thigh. The interstitial flius also appears to be greater on the right than left   Elishah Ashmore, RVS 08/03/2014, 1:48 PM

## 2014-08-03 NOTE — ED Notes (Signed)
Pt c/o sudden onset of lt leg pain sharp.  Med given  Pain went away then returned.  More chest discomfort after the dialudid

## 2014-08-03 NOTE — H&P (Signed)
[facility] History and Physical  [name] [id number] DOB: 19-Aug-1955 DOA: 08/02/2014  Referring physician: Yvone Neu. PCP: [name], NP  Specialists: Dr. Mercy Moore. Nephrologist.  Chief Complaint: Shortness of breath.  HPI: Eric [name] is a 59 y.o. male with history of chronic combined systolic and diastolic CHF last EF measured last month was 45-50%, chronic kidney disease stage III, liver cirrhosis with hepatitis C, MGUS, diabetes mellitus, history of CAD status post CABG and cardiac arrest status post AICD placement presents to the ER because of shortness of breath. Patient states that over the last 2 days patient became acutely short of breath which increases on exertion and was unable to lie flat. Patient also has been having at times excessive chest pain which improved with rest. Denies any productive cough fever chills nausea vomiting abdominal pain or diarrhea. Patient also has increasing lower extremity edema. In the ER chest x-ray shows congestion with pleural effusion and on exam patient has significant lower extremity edema and elevated JVD. Patient has been off Lasix and had visited nephrologist Dr. Mercy Moore on July 8 and was prescribed Lasix 80 mg twice daily which patient is yet to take and patient is also supposed to be on labetalol and Norvasc by mouth which patient is yet to take. Patient also was recently started on clonidine. In the ER patient's blood pressure was found to be elevated but improved with diuresis and pain control.   Review of Systems: As presented in the history of presenting illness, rest negative.  Past Medical History  Diagnosis Date  . Hypertension   . Hyperlipemia   . Edema   . 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 Obesity   . Ventricular tachycardia   . AICD (automatic cardioverter/defibrillator) present   . CHF (congestive heart failure)   . Myocardial infarction 03/2013  . Pneumonia 2016  . Type II  diabetes mellitus   . History of blood transfusion 1950's    "related to pinal menigitis; HAD 16 OPERATIONS TOTAL"  . Scabies infestation 06/29/2014   Past Surgical History  Procedure Laterality Date  . Implantable cardioverter defibrillator implant  03/24/2013    STJ single chamber ICD implanted by Dr Caryl Comes for secondary prevention  . Left heart catheterization with coronary/graft angiogram  03/07/2013    Procedure: LEFT HEART CATHETERIZATION WITH [name];  Surgeon: Clent Demark, MD;  Location: [facility] CATH LAB;  Service: Cardiovascular;;  . Implantable cardioverter defibrillator implant N/A 03/24/2013    Procedure: IMPLANTABLE CARDIOVERTER DEFIBRILLATOR IMPLANT;  Surgeon: Deboraha Sprang, MD;  Location: [facility] CATH LAB;  Service: Cardiovascular;  Laterality: N/A;  . Cardiac catheterization    . Head surgery  1950'S    "for spinal menigitis; HAD 16 OPERATIONS TOTAL"  . Back surgery  1950's    "for spinal menigitis; HAD 16 OPERATIONS TOTAL"  . 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 Coronary artery bypass graft  1999    cabg x4   Social History:  reports that he has quit smoking. His smoking use included Cigarettes. He has a 5 pack-year smoking history. He has never used smokeless tobacco. He reports that he does not drink alcohol or use illicit drugs. Where does patient live home. Can patient participate in ADLs? Yes.  No Known Allergies  Family History:  Family History  Problem Relation Age of Onset  . Heart disease Mother   . Diabetes Mother  Prior to Admission medications   Medication Sig Start Date End Date Taking? Authorizing Provider  aspirin EC 81 MG tablet Take 81 mg by mouth daily.   Yes Historical Provider, MD  cloNIDine (CATAPRES) 0.2 MG tablet Take 1 tablet (0.2 mg total) by mouth 2 (two) times daily. 07/24/14  Yes Volanda Napoleon, MD    Physical Exam: Filed Vitals:   08/03/14 0215 08/03/14 0300 08/03/14 0345  08/03/14 0445  BP: 171/140 156/79 164/82   Pulse:  86 85   Temp:      TempSrc:      Resp: 31 21 22    SpO2:  87% 90% 92%     General:  Moderately built and nourished.  Eyes: Anicteric no pallor.  ENT: No discharge from the ears eyes nose and mouth.  Neck: JVD elevated. No mass felt.  Cardiovascular: S1-S2 heard.  Respiratory: No rhonchi or crepitations.  Abdomen: Soft nontender bowel sounds present. Abdominal hernia present.  Skin: Chronic skin rash all over the body.  Musculoskeletal: Bilateral lower extremity edema.  Psychiatric: Appears normal.  Neurologic: Alert awake oriented to time place and person. Moves all extremities.  Labs on Admission:  Basic Metabolic Panel:  Recent Labs Lab 08/03/14 0011  NA 136  K 4.3  CL 108  CO2 22  GLUCOSE 89  BUN 43*  CREATININE 2.97*  CALCIUM 8.2*   Liver Function Tests: No results for input(s): AST, ALT, ALKPHOS, BILITOT, PROT, ALBUMIN in the last 168 hours. No results for input(s): LIPASE, AMYLASE in the last 168 hours. No results for input(s): AMMONIA in the last 168 hours. CBC:  Recent Labs Lab 08/03/14 0011  WBC 9.5  HGB 10.1*  HCT 29.2*  MCV 87.4  PLT 198   Cardiac Enzymes: No results for input(s): CKTOTAL, CKMB, CKMBINDEX, TROPONINI in the last 168 hours.  BNP (last 3 results)  Recent Labs  04/20/14 1125 06/29/14 1414 08/03/14 0012  BNP 125.3* 199.9* 952.6*    ProBNP (last 3 results)  Recent Labs  11/26/13 1300  PROBNP 452.5*    CBG: No results for input(s): GLUCAP in the last 168 hours.  Radiological Exams on Admission: Dg Chest Port 1 View  08/03/2014   CLINICAL DATA:  59 year old male with shortness of breath  EXAM: PORTABLE CHEST - 1 VIEW  COMPARISON:  Chest radiograph dated 06/29/2014  FINDINGS: Single-view of the chest demonstrate prominence of the central pulmonary vessels with increased density in the lower lung fields compatible with congestive heart failure. There are small  bilateral pleural effusions. Stable cardiac silhouette. Left pectoral pacemaker device and median sternotomy wires noted.  IMPRESSION: Increased interstitial and central vascular prominence compatible with CHF. Superimposed pneumonia is not excluded. Clinical correlation and follow-up recommended.  Small bilateral pleural effusions.   Electronically Signed   By: Anner Crete M.D.   On: 08/03/2014 00:47    EKG: Independently reviewed. Normal sinus rhythm.  Assessment/Plan Principal Problem:   Acute on chronic combined systolic and diastolic CHF (congestive heart failure) Active Problems:   Cardiomyopathy, ischemic   Cirrhosis   CKD (chronic kidney disease) stage 3, GFR 30-59 ml/min   CHF (congestive heart failure)   Hypertensive urgency   Diabetes mellitus type 2, controlled   1. Acute on chronic combined systolic and diastolic CHF with last EF measured was 45-50% June 2016 - patient received Lasix 60 mg IV in the ER and I have placed patient on 60 mg IV every 12 hourly. Closely follow intake output metabolic panel and  daily weights. If patient does not have significant output from the Lasix and may need nephrology input. Patient is not on ACE inhibitor or ARB secondary to renal failure. Patient's CHF could also be from uncontrolled hypertension see #2. 2. Hypertensive urgency - patient's blood pressure improved with diuertic and pain control. Patient was supposed to be on Norvasc 10 mg which I have placed patient on and patient is on clonidine 0.2 mg twice a day which I have changed to 3 times a day. 3. Chest pain with history of CAD - cycle cardiac markers. Presley chest pain-free. Continue aspirin. 4. History of cardiac status post AICD placement. 5. Chronic kidney disease stage III - closely observe intake output metabolic panel. 6. Diabetes mellitus type 2 controlled - patient usually takes glipizide and have placed patient on 5 mg by mouth daily with sliding scale coverage. Closely  follow CBGs. If patient's creatinine worsens and may have to hold off glipizide and place patient on long-acting insulin. 7. Chronic anemia - follow CBC. 8. History of liver cirrhosis with hepatitis C - patient is on diuretic. Further workup for liver cirrhosis as outpatient. 9. Chronic skin rash and was treated for scabies last month. 10. 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   DVT Prophylaxis Lovenox.  Code Status: Full code.  Family Communication: Discussed with family at the bedside.  Disposition Plan: Admit to inpatient.    Icker Swigert N. [facility] Pager [phone].  If 7PM-7AM, please contact night-coverage [email or web address] Password [email or web address] 08/03/2014, 5:04 AM

## 2014-08-04 DIAGNOSIS — N179 Acute kidney failure, unspecified: Secondary | ICD-10-CM

## 2014-08-04 DIAGNOSIS — E669 Obesity, unspecified: Secondary | ICD-10-CM

## 2014-08-04 LAB — BASIC METABOLIC PANEL
ANION GAP: 7 (ref 5–15)
BUN: 52 mg/dL — ABNORMAL HIGH (ref 6–20)
CO2: 22 mmol/L (ref 22–32)
Calcium: 8 mg/dL — ABNORMAL LOW (ref 8.9–10.3)
Chloride: 104 mmol/L (ref 101–111)
Creatinine, Ser: 3.29 mg/dL — ABNORMAL HIGH (ref 0.61–1.24)
GFR calc Af Amer: 22 mL/min — ABNORMAL LOW (ref >60)
GFR calc non Af Amer: 19 mL/min — ABNORMAL LOW (ref >60)
Glucose, Bld: 166 mg/dL — ABNORMAL HIGH (ref 65–99)
Potassium: 3.9 mmol/L (ref 3.5–5.1)
Sodium: 133 mmol/L — ABNORMAL LOW (ref 135–145)

## 2014-08-04 LAB — GLUCOSE, CAPILLARY
GLUCOSE-CAPILLARY: 123 mg/dL — AB (ref 65–99)
GLUCOSE-CAPILLARY: 127 mg/dL — AB (ref 65–99)
Glucose-Capillary: 132 mg/dL — ABNORMAL HIGH (ref 65–99)
Glucose-Capillary: 180 mg/dL — ABNORMAL HIGH (ref 65–99)

## 2014-08-04 MED ORDER — CARVEDILOL 3.125 MG PO TABS
3.1250 mg | ORAL_TABLET | Freq: Two times a day (BID) | ORAL | Status: DC
  Administered 2014-08-05 – 2014-08-06 (×3): 3.125 mg via ORAL
  Filled 2014-08-04 (×5): qty 1

## 2014-08-04 MED ORDER — LIVING BETTER WITH HEART FAILURE BOOK
Freq: Once | Status: AC
  Administered 2014-08-04: 11:00:00

## 2014-08-04 MED ORDER — TORSEMIDE 20 MG PO TABS
40.0000 mg | ORAL_TABLET | Freq: Two times a day (BID) | ORAL | Status: DC
  Administered 2014-08-04 – 2014-08-05 (×3): 40 mg via ORAL
  Filled 2014-08-04 (×6): qty 2

## 2014-08-04 MED ORDER — METOLAZONE 5 MG PO TABS
5.0000 mg | ORAL_TABLET | Freq: Every day | ORAL | Status: DC
  Administered 2014-08-04 – 2014-08-05 (×2): 5 mg via ORAL
  Filled 2014-08-04 (×3): qty 1

## 2014-08-04 NOTE — Progress Notes (Signed)
Pt complaining of stiff pain on all four's extremities, offered Morphine but is refusing. Raliegh Ip Schorr was notified. Will continue to monitor pt

## 2014-08-04 NOTE — Progress Notes (Signed)
PROGRESS NOTE  Eric [name] [id number] DOB: 1955-02-06 DOA: 08/02/2014 PCP: [name], NP  HPI/Recap of past 24 hours: 1 oh male with past mental history of chronic systolic/diastolic heart failure, chronic kidney disease stage III, hepatitis C cirrhosis and diabetes mellitus admitted on early morning of 7/11 for acute respiratory failure secondary to CHF. Is also found to have markedly elevated blood pressures with systolic greater than [id number].  Patient admits to being noncompliant with his medications and says because his kidney function, he was told to stop them. Started on IV Lasix plus other medications. He also was complaining of bilateral leg pain that started a day or so before admission. Lower extremity Dopplers ruled out DVT, but did comment on fluid seen in both lower extremities.  Overnight did okay. Has diuresed 1.8 L so far. He notices no difference in his breathing. CT of lower extremities notes subcutaneous edema, possibly with cellulitis. She himself continues to complain of severe leg pain as well as continued if difficulty breathing.  Assessment/Plan: Principal Problem:   Acute respiratory failure with hypoxia secondary to Acute on chronic combined systolic and diastolic CHF (congestive heart failure) from ischemic cardiomyopathy: Continue aggressive Lasix. Supplemental oxygen. Down 1.8 L Echocardiogram done last month notes EF of 45% and grade 1 diastolic heart failure. [facility] consult cardiology for assistance short and long-term.    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 Appears stable. We'll confirm patient has close follow-up   CKD (chronic kidney disease) stage 3, GFR 30-59 ml/min: Mild increase in creatinine today. Maybe from Lasix. Continue to monitor output.    Hypertensive urgency: Scheduled hydralazine, Imdur and clonidine. Holding off on beta blocker until heart failure less decompensated. No Ace or ARB secondary to renal failure: Blood pressure responding  well to medications.    Diabetes mellitus type 2, controlled, continue sliding scale coverage, blood sugars are staying well-controlled area. A1c last month noting good control at 6.2.    Obesity (BMI 30-39.9): Patient meets criteria with BMI > 30   Bilateral leg pain: No DVT. Spoke with infectious disease and given lack of fever or white blood cell count, this is most certainly secondary to volume overload from CHF  ? Drug [id number] has a large number of markings all over his body which look like evidence of skin popping.awaiting urine drug screen. He denies this stating that these are all insect bites which he has scratched himself and there are old scars  Code Status: Full  Family Communication: Wife at the bedside  Disposition Plan: Discharge once blood pressure better controlled, fully diuresed, possibly within next 24-48 hours   Consultants:  Dr.Ganji-cardiology  Procedures:  None  Antibiotics:  None   Objective: BP 143/79 mmHg  Pulse 65  Temp(Src) 97.7 F (36.5 C) (Oral)  Resp 18  Ht 5\' 6"  (1.676 m)  Wt 89.132 kg (196 lb 8 oz)  BMI 31.73 kg/m2  SpO2 100%  Intake/Output Summary (Last 24 hours) at 08/04/14 1339 Last data filed at 08/04/14 1113  Gross per 24 hour  Intake    300 ml  Output   1625 ml  Net  -1325 ml   Filed Weights   08/03/14 0637 08/04/14 0609  Weight: 89.086 kg (196 lb 6.4 oz) 89.132 kg (196 lb 8 oz)    Exam:   General:  Alert and oriented 3, mild distress secondary to pain  Cardiovascular: regular rhythm and rate 2/6 systolic ejection murmur  Respiratory: decreased breath sounds bibasilar  Abdomen: soft, nontender, nondistended,  positive bowel sounds  Musculoskeletal: 2 pitting edema bilaterally   Data Reviewed: Basic Metabolic Panel:  Recent Labs Lab 08/03/14 0011 08/03/14 0843 08/04/14 0300  NA 136 137 133*  K 4.3 4.2 3.9  CL 108 109 104  CO2 22 21* 22  GLUCOSE 89 132* 166*  BUN 43* 44* 52*  CREATININE 2.97*  2.90* 3.29*  CALCIUM 8.2* 8.4* 8.0*  MG  --  2.0  --    Liver Function Tests:  Recent Labs Lab 08/03/14 0843  AST 27  ALT 24  ALKPHOS 69  BILITOT 0.5  PROT 8.1  ALBUMIN 2.1*   No results for input(s): LIPASE, AMYLASE in the last 168 hours. No results for input(s): AMMONIA in the last 168 hours. CBC:  Recent Labs Lab 08/03/14 0011 08/03/14 0843  WBC 9.5 8.2  NEUTROABS  --  5.5  HGB 10.1* 9.9*  HCT 29.2* 29.5*  MCV 87.4 87.8  PLT 198 195   Cardiac Enzymes:    Recent Labs Lab 08/03/14 0843 08/03/14 1545 08/03/14 1859  TROPONINI 0.08* 0.07* 0.07*   BNP (last 3 results)  Recent Labs  04/20/14 1125 06/29/14 1414 08/03/14 0012  BNP 125.3* 199.9* 952.6*    ProBNP (last 3 results)  Recent Labs  11/26/13 1300  PROBNP 452.5*    CBG:  Recent Labs Lab 08/03/14 1113 08/03/14 1653 08/03/14 2108 08/04/14 0607 08/04/14 1119  GLUCAP 144* 144* 159* 132* 123*    No results found for this or any previous visit (from the past 240 hour(s)).   Studies: Ct Extrem Lower Wo Cm Bil  08/03/2014   CLINICAL DATA:  59 year old male with bilateral leg pain  EXAM: CT OF THE LOWER BILATERAL EXTREMITY WITHOUT CONTRAST  TECHNIQUE: Multidetector CT imaging of the bilateral lower extremities was performed according to the standard protocol.  COMPARISON:  None.  FINDINGS: Evaluation of this exam is limited in the absence of intravenous contrast.  No acute fracture or dislocation.  There is diffuse subcutaneous soft tissue stranding and edema compatible with cellulitis. No drainable fluid collection/abscess identified.  There is aortoiliac atherosclerotic disease. No lymphadenopathy. The prostate gland, urinary bladder appear unremarkable. Constipation. The appendix appears unremarkable. Small bilateral hydroceles.  IMPRESSION: Diffuse subcutaneous soft tissue edema and stranding of the bilateral lower extremity. No drainable fluid collection/ abscess.   Electronically Signed   By:  Anner Crete M.D.   On: 08/03/2014 21:23    Scheduled Meds: . amLODipine  10 mg Oral Daily  . antiseptic oral rinse  7 mL Mouth Rinse BID  . aspirin EC  81 mg Oral Daily  . cloNIDine  0.3 mg Oral 3 times per day  . enoxaparin (LOVENOX) injection  30 mg Subcutaneous Daily  . furosemide  60 mg Intravenous Q12H  . glipiZIDE  5 mg Oral QAC breakfast  . hydrALAZINE  25 mg Oral 3 times per day  . insulin aspart  0-9 Units Subcutaneous TID WC  . isosorbide mononitrate  30 mg Oral Daily  . sodium chloride  3 mL Intravenous Q12H    Continuous Infusions:    Time spent: 25  min  [facility] Pager [phone]. If 7PM-7AM, please contact night-coverage at [email or web address], password [facility] 08/04/2014, 1:39 PM  LOS: 1 day

## 2014-08-04 NOTE — Care Management Note (Signed)
Case Management Note  Patient Details  Name: Eric [name] MRN: [id number] Date of Birth: 1956-01-13  Subjective/Objective:       Admitted with CHF             Action/Plan: Patient lives with spouse, use a walker at home; Goes to [place], [name], NP for medical care; Has Medicaid and stated that he does not have any problem getting his medication. He is also trying to get on disability. Patient stated that he has had bad experience with [facility] - stated "they don't do nothing,' but is agreeable to try them again for the Disease Management Program for CHF. Choice offered, patient chose [facility]; Mary with [facility] called for arrangements and she will also talk to the patient about the duties [facility]. Patient has a walker at home.   Expected Discharge Date:    possibly 08/07/2014              Expected Discharge Plan:  [place]  Discharge planning Services  CM Consult    Choice offered to:  Patient  HH Arranged:  Disease Management [place] Agency:  Other - See comment/ [facility]  Status of Service:  In process, will continue to follow  Sherrilyn Rist [id number] 08/04/2014, 10:57 AM

## 2014-08-04 NOTE — Consult Note (Signed)
CARDIOLOGY CONSULT NOTE  Patient ID: Eric [name] MRN: [id number] DOB/AGE: February 03, 1955 59 y.o.  Admit date: 08/02/2014 Referring Physician  [facility] Primary Physician:  [name], NP Reason for Consultation  Acute on Chronic Heart Failure  HPI: Eric [name]  is a 59 y.o. male with a history of small vessel coronary artery disease by angiography, chronic diastolic heart failure, h/o aborted cardiac arrest, s/p ICD placement, CKD stage III, hepatitis C with cirrhosis, and DM who presented to the ED on 08/03/2014 with worsening SOB and edema over 3-4 days. Pt also noted to be markedly hypertensive. Pt states he had seen cardiologist in the recent past but did not feel that his medications were appropriate, therefore had stopped taking them and had not followed up recently.  Pt reports mild improvement in symptoms with 1 day of diuresis but continues to report SOB at rest associated with chest heaviness and edema.    Past Medical History  Diagnosis Date  . Hypertension   . Hyperlipemia   . Edema   . 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 Obesity   . Ventricular tachycardia   . AICD (automatic cardioverter/defibrillator) present   . CHF (congestive heart failure)   . Myocardial infarction 03/2013  . Pneumonia 2016  . History of blood transfusion 1950's    "related to pinal menigitis; HAD 16 OPERATIONS TOTAL"  . Scabies infestation 06/29/2014  . Shortness of breath dyspnea   . Type II diabetes mellitus     type 2  . CKD (chronic kidney disease), stage III      Past Surgical History  Procedure Laterality Date  . Implantable cardioverter defibrillator implant  03/24/2013    STJ single chamber ICD implanted by Dr Caryl Comes for secondary prevention  . Left heart catheterization with coronary/graft angiogram  03/07/2013    Procedure: LEFT HEART CATHETERIZATION WITH [name];  Surgeon: Clent Demark, MD;  Location: [facility] CATH LAB;  Service:  Cardiovascular;;  . Implantable cardioverter defibrillator implant N/A 03/24/2013    Procedure: IMPLANTABLE CARDIOVERTER DEFIBRILLATOR IMPLANT;  Surgeon: Deboraha Sprang, MD;  Location: [facility] CATH LAB;  Service: Cardiovascular;  Laterality: N/A;  . Cardiac catheterization    . Head surgery  1950'S    "for spinal menigitis; HAD 16 OPERATIONS TOTAL"  . Back surgery  1950's    "for spinal menigitis; HAD 16 OPERATIONS TOTAL"  . 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 Coronary artery bypass graft  1999    cabg x4     Family History  Problem Relation Age of Onset  . Heart disease Mother   . Diabetes Mother      Social History: History   Social History  . Marital Status: Married    Spouse Name: N/A  . Number of Children: N/A  . Years of Education: N/A   Occupational History  . Not on file.   Social History Main Topics  . Smoking status: Former Smoker -- 1.00 packs/day for 5 years    Types: Cigarettes  . Smokeless tobacco: Never Used     Comment: "quit smoking in the 1960's"  . Alcohol Use: No  . Drug Use: No  . Sexual Activity: Not Currently   Other Topics Concern  . Not on file   Social History Narrative     Prescriptions prior to admission  Medication Sig Dispense Refill Last Dose  . aspirin EC 81 MG tablet Take 81 mg  by mouth daily.   08/02/2014 at Unknown time  . cloNIDine (CATAPRES) 0.2 MG tablet Take 1 tablet (0.2 mg total) by mouth 2 (two) times daily. 60 tablet 1 08/02/2014 at Unknown time     ROS: General: no fevers/chills/night sweats Eyes: no blurry vision, diplopia, or amaurosis ENT: no sore throat or hearing loss Resp: reports dyspnea at rest; no cough, wheezing, or hemoptysis CV: reports chest heaviness and edema; no orthopnea, PND, or palpitations GI: no abdominal pain, nausea, vomiting, diarrhea, or constipation GU: no dysuria, frequency, or hematuria Skin: chronic skin lesions all over body in multiple stages of  scarring and scabbing Neuro: no headache, numbness, tingling, or weakness of extremities Musculoskeletal: no joint pain or swelling Heme: no bleeding, DVT, or easy bruising Endo: no polydipsia or polyuria    Physical Exam: Blood pressure 143/79, pulse 65, temperature 97.7 F (36.5 C), temperature source Oral, resp. rate 18, height 5\' 6"  (1.676 m), weight 89.132 kg (196 lb 8 oz), SpO2 100 %.   General appearance: alert, cooperative, appears older than stated age, no distress and mildly obese Neck: no adenopathy, no carotid bruit, supple, symmetrical, trachea midline, thyroid not enlarged, symmetric, no tenderness/mass/nodules and + JVD Lungs: diminished breath sounds bilaterally Chest wall: no tenderness Heart: regular rate and rhythm, S1, S2 normal, no murmur, click, rub or gallop Abdomen: abnormal findings:  distended, obese and hepatomegaly Extremities: edema 3+ bilaterally Pulses: unable to palpate bilateral lower extremity pulses Skin: mobility and turgor normal or multiple small lesions over entire body in various stages of scarring and scabbing Neurologic: Grossly normal  Labs:   Lab Results  Component Value Date   WBC 8.2 08/03/2014   HGB 9.9* 08/03/2014   HCT 29.5* 08/03/2014   MCV 87.8 08/03/2014   PLT 195 08/03/2014    Recent Labs Lab 08/03/14 0843 08/04/14 0300  NA 137 133*  K 4.2 3.9  CL 109 104  CO2 21* 22  BUN 44* 52*  CREATININE 2.90* 3.29*  CALCIUM 8.4* 8.0*  PROT 8.1  --   BILITOT 0.5  --   ALKPHOS 69  --   ALT 24  --   AST 27  --   GLUCOSE 132* 166*    Lipid Panel     Component Value Date/Time   TRIG 191* 03/10/2013 1130    BNP (last 3 results)  Recent Labs  04/20/14 1125 06/29/14 1414 08/03/14 0012  BNP 125.3* 199.9* 952.6*    ProBNP (last 3 results)  Recent Labs  11/26/13 1300  PROBNP 452.5*    HEMOGLOBIN A1C Lab Results  Component Value Date   HGBA1C 6.2* 07/01/2014   MPG 131 07/01/2014    Cardiac Panel (last 3  results)  Recent Labs  02/23/14 2212  06/29/14 2020  08/03/14 0843 08/03/14 1545 08/03/14 1859  CKTOTAL 285*  --  294  --   --   --   --   CKMB 4.0  --   --   --   --   --   --   TROPONINI 0.04*  < >  --   < > 0.08* 0.07* 0.07*  RELINDX 1.4  --   --   --   --   --   --   < > = values in this interval not displayed.  Lab Results  Component Value Date   CKTOTAL 294 06/29/2014   CKMB 4.0 02/23/2014   TROPONINI 0.07* 08/03/2014     TSH  Recent Labs  06/29/14 2020  08/03/14 0843  TSH 2.800 2.421    EKG 08/03/2014: sinus rhythm at a rate of 98bpm, normal axis, left atrial abnormality, PRWP, T-wave flattening and inversion in lateral leads, cannot exclude anterolateral infarct, old.  Echo 07/01/2014: Left ventricle: The cavity size was normal. There was mild concentric hypertrophy. Systolic function was mildly reduced. The estimated ejection fraction was in the range of 45% to 50%. Doppler parameters are consistent with abnormal left ventricular relaxation (grade 1 diastolic dysfunction). Atrial septum: No defect or patent foramen ovale was identified. Pulmonary arteries: Systolic pressure was moderately increased. PA peak pressure: 42 mm Hg (S).   Radiology: Dg Chest Port 1 View  08/03/2014   CLINICAL DATA:  59 year old male with shortness of breath  EXAM: PORTABLE CHEST - 1 VIEW  COMPARISON:  Chest radiograph dated 06/29/2014  FINDINGS: Single-view of the chest demonstrate prominence of the central pulmonary vessels with increased density in the lower lung fields compatible with congestive heart failure. There are small bilateral pleural effusions. Stable cardiac silhouette. Left pectoral pacemaker device and median sternotomy wires noted.  IMPRESSION: Increased interstitial and central vascular prominence compatible with CHF. Superimposed pneumonia is not excluded. Clinical correlation and follow-up recommended.  Small bilateral pleural effusions.   Electronically Signed   By: Anner Crete M.D.   On: 08/03/2014 00:47   Ct Extrem Lower Wo Cm Bil  08/03/2014   CLINICAL DATA:  59 year old male with bilateral leg pain  EXAM: CT OF THE LOWER BILATERAL EXTREMITY WITHOUT CONTRAST  TECHNIQUE: Multidetector CT imaging of the bilateral lower extremities was performed according to the standard protocol.  COMPARISON:  None.  FINDINGS: Evaluation of this exam is limited in the absence of intravenous contrast.  No acute fracture or dislocation.  There is diffuse subcutaneous soft tissue stranding and edema compatible with cellulitis. No drainable fluid collection/abscess identified.  There is aortoiliac atherosclerotic disease. No lymphadenopathy. The prostate gland, urinary bladder appear unremarkable. Constipation. The appendix appears unremarkable. Small bilateral hydroceles.  IMPRESSION: Diffuse subcutaneous soft tissue edema and stranding of the bilateral lower extremity. No drainable fluid collection/ abscess.   Electronically Signed   By: Anner Crete M.D.   On: 08/03/2014 21:23    Scheduled Meds: . amLODipine  10 mg Oral Daily  . antiseptic oral rinse  7 mL Mouth Rinse BID  . aspirin EC  81 mg Oral Daily  . cloNIDine  0.3 mg Oral 3 times per day  . enoxaparin (LOVENOX) injection  30 mg Subcutaneous Daily  . furosemide  60 mg Intravenous Q12H  . glipiZIDE  5 mg Oral QAC breakfast  . hydrALAZINE  25 mg Oral 3 times per day  . insulin aspart  0-9 Units Subcutaneous TID WC  . isosorbide mononitrate  30 mg Oral Daily  . sodium chloride  3 mL Intravenous Q12H   Continuous Infusions:  PRN Meds:.acetaminophen **OR** acetaminophen, hydrALAZINE, morphine injection, ondansetron **OR** ondansetron (ZOFRAN) IV  ASSESSMENT AND PLAN:  1. Acute on chronic heart failure 2. Small vessel CAD 3. Accelerated HTN 4. DM 5. Hepatitis C with hepatic cirrhosis 6. CKD Stage III-IV 7. Single chamber ICD for h/o aborted C-Arrest, St Jude ICD, serial number [id number] on 04/06/2013 Dr. Caryl Comes. 8.  Moderate pulmonary hypertension by echocardiogram  Recommendation: pt in acute on chronic heart failure, most likely due to non-compliance with medications and diet.  Reports good urine output and improved symptoms with IV diuresis. Will continue the same for now.    Rachel Bo, NP-C 08/04/2014, 1:26 PM  [facility] Pager: [phone] Office: [phone]

## 2014-08-04 NOTE — Progress Notes (Signed)
Patient ambulated approximately 500 feet, using front wheel walker. Patient oxygen saturation maintained at 100% on room air. Patient denied SOB, chest pain, or dizziness.

## 2014-08-05 LAB — GLUCOSE, CAPILLARY
GLUCOSE-CAPILLARY: 131 mg/dL — AB (ref 65–99)
GLUCOSE-CAPILLARY: 79 mg/dL (ref 65–99)
Glucose-Capillary: 120 mg/dL — ABNORMAL HIGH (ref 65–99)
Glucose-Capillary: 82 mg/dL (ref 65–99)
Glucose-Capillary: 112 mg/dL — ABNORMAL HIGH (ref 65–99)

## 2014-08-05 LAB — BASIC METABOLIC PANEL
ANION GAP: 10 (ref 5–15)
BUN: 55 mg/dL — ABNORMAL HIGH (ref 6–20)
CO2: 22 mmol/L (ref 22–32)
CREATININE: 3.3 mg/dL — AB (ref 0.61–1.24)
Calcium: 8.2 mg/dL — ABNORMAL LOW (ref 8.9–10.3)
Chloride: 103 mmol/L (ref 101–111)
GFR calc non Af Amer: 19 mL/min — ABNORMAL LOW (ref >60)
GFR, EST AFRICAN AMERICAN: 22 mL/min — AB (ref >60)
GLUCOSE: 150 mg/dL — AB (ref 65–99)
Potassium: 3.6 mmol/L (ref 3.5–5.1)
SODIUM: 135 mmol/L (ref 135–145)

## 2014-08-05 MED ORDER — MORPHINE SULFATE 4 MG/ML IJ SOLN
4.0000 mg | Freq: Once | INTRAMUSCULAR | Status: AC
  Administered 2014-08-05: 4 mg via INTRAVENOUS
  Filled 2014-08-05: qty 1

## 2014-08-05 MED ORDER — MORPHINE SULFATE 2 MG/ML IJ SOLN
2.0000 mg | INTRAMUSCULAR | Status: DC | PRN
  Administered 2014-08-05 – 2014-08-07 (×2): 2 mg via INTRAVENOUS
  Filled 2014-08-05 (×3): qty 1

## 2014-08-05 NOTE — Progress Notes (Signed)
Patient C/O of pain, 2 mg of morhpine  given per order, few sec after med given, patient became unconscious for few seconds, breathing ok, patient woke alert and oriented but c/o chest heavy, headache and leg pain. 12 lead EKG done, vital stable, nothing abnormal on the monitor. SR  in 60.  NP called, no new order given. Patient is fine now. Wife at bedside. Will continue to monitor patient.

## 2014-08-05 NOTE — Progress Notes (Addendum)
Subjective:  Pt reports diffuse pain throughout the night, reports minimal improvement in breathing, some improvement in lower extremity edema. No new concerns this morning. Wife at the bedside.    Objective:  Vital Signs in the last 24 hours: Temp:  [97.6 F (36.4 C)-98.1 F (36.7 C)] 98.1 F (36.7 C) (07/13 [id number]) Pulse Rate:  [65-81] 72 (07/13 0638) Resp:  [18] 18 (07/12 2100) BP: (112-173)/(69-98) 150/90 mmHg (07/13 0638) SpO2:  [94 %-98 %] 98 % (07/12 2100)  Intake/Output from previous day: 07/12 0701 - 07/13 0700 In: 1160 [P.O.:1160] Out: 1375 [Urine:1375]  Physical Exam:  General appearance: alert, cooperative, appears older than stated age, no distress and mildly obese Neck: no adenopathy, no carotid bruit, supple, symmetrical, trachea midline, thyroid not enlarged, symmetric, no tenderness/mass/nodules and + JVD Lungs: diminished breath sounds bilaterally Chest wall: no tenderness Heart: regular rate and rhythm, S1, S2 normal, no murmur, click, rub or gallop Abdomen: abnormal findings: distended, obese and hepatomegaly Extremities: 2+ pedal edema bilaterally, 1+ lower leg edema Pulses: unable to palpate bilateral lower extremity pulses Skin: mobility and turgor normal or multiple small lesions over entire body in various stages of scarring and scabbing Neurologic: Grossly normal  Lab Results: BMP  Recent Labs  08/03/14 0843 08/04/14 0300 08/05/14 0252  NA 137 133* 135  K 4.2 3.9 3.6  CL 109 104 103  CO2 21* 22 22  GLUCOSE 132* 166* 150*  BUN 44* 52* 55*  CREATININE 2.90* 3.29* 3.30*  CALCIUM 8.4* 8.0* 8.2*  GFRNONAA 22* 19* 19*  GFRAA 26* 22* 22*    CBC  Recent Labs Lab 08/03/14 0843  WBC 8.2  RBC 3.36*  HGB 9.9*  HCT 29.5*  PLT 195  MCV 87.8  MCH 29.5  MCHC 33.6  RDW 13.4  LYMPHSABS 1.9  MONOABS 0.6  EOSABS 0.1  BASOSABS 0.0    HEMOGLOBIN A1C Lab Results  Component Value Date   HGBA1C 6.2* 07/01/2014   MPG 131 07/01/2014     Cardiac Panel (last 3 results)  Recent Labs  02/23/14 2212  06/29/14 2020  08/03/14 0843 08/03/14 1545 08/03/14 1859  CKTOTAL 285*  --  294  --   --   --   --   CKMB 4.0  --   --   --   --   --   --   TROPONINI 0.04*  < >  --   < > 0.08* 0.07* 0.07*  RELINDX 1.4  --   --   --   --   --   --   < > = values in this interval not displayed.  BNP (last 3 results)  Recent Labs  11/26/13 1300  PROBNP 452.5*    TSH  Recent Labs  06/29/14 2020 08/03/14 0843  TSH 2.800 2.421    CHOLESTEROL No results for input(s): CHOL in the last 8760 hours.  Hepatic Function Panel  Recent Labs  07/03/14 0359 07/05/14 0555 07/24/14 0855 08/03/14 0843  PROT 7.8  --  7.6 8.1  ALBUMIN 2.1* 2.4* 2.5* 2.1*  AST 22  --  22 27  ALT 26  --  23 24  ALKPHOS 56  --  68 69  BILITOT 0.4  --  0.4 0.5  BILIDIR <0.1*  --   --   --   IBILI NOT CALCULATED  --   --   --     Imaging: Ct Extrem Lower Wo Cm Bil  08/03/2014   CLINICAL DATA:  59 year old  male with bilateral leg pain  EXAM: CT OF THE LOWER BILATERAL EXTREMITY WITHOUT CONTRAST  TECHNIQUE: Multidetector CT imaging of the bilateral lower extremities was performed according to the standard protocol.  COMPARISON:  None.  FINDINGS: Evaluation of this exam is limited in the absence of intravenous contrast.  No acute fracture or dislocation.  There is diffuse subcutaneous soft tissue stranding and edema compatible with cellulitis. No drainable fluid collection/abscess identified.  There is aortoiliac atherosclerotic disease. No lymphadenopathy. The prostate gland, urinary bladder appear unremarkable. Constipation. The appendix appears unremarkable. Small bilateral hydroceles.  IMPRESSION: Diffuse subcutaneous soft tissue edema and stranding of the bilateral lower extremity. No drainable fluid collection/ abscess.   Electronically Signed   By: Anner Crete M.D.   On: 08/03/2014 21:23    Cardiac Studies:  EKG 08/03/2014: sinus rhythm at a  rate of 98bpm, normal axis, left atrial abnormality, PRWP, T-wave flattening and inversion in lateral leads, cannot exclude anterolateral infarct, old.  Echo 07/01/2014: Left ventricle: The cavity size was normal. There was mild concentric hypertrophy. Systolic function was mildly reduced. The estimated ejection fraction was in the range of 45% to 50%. Doppler parameters are consistent with abnormal left ventricular relaxation (grade 1 diastolic dysfunction). Atrial septum: No defect or patent foramen ovale was identified. Pulmonary arteries: Systolic pressure was moderately increased. PA peak pressure: 42 mm Hg (S).  Assessment/Plan:  1. Acute on chronic heart failure 2. Small vessel CAD 3. Accelerated HTN 4. DM 5. Hepatitis C with hepatic cirrhosis 6. CKD Stage III-IV 7. Single chamber ICD for h/o aborted C-Arrest, St Jude ICD, serial number [id number] on 04/06/2013 Dr. Caryl Comes. 8. Moderate pulmonary hypertension by echocardiogram  Recommendation: Edema has improved, continues to report shortness of breath. Blood pressure remains slightly elevated but significantly improved since admission. Continue present therapy. Likely stable for discharge in 1-2 days.   Rachel Bo, NP-C 08/05/2014, 8:10 AM [facility] Pager: [phone] Office: [phone]

## 2014-08-05 NOTE — Progress Notes (Signed)
[name] complaining of numbness pain 10/10 all over the body, per pt " I can't moved my body, it's stiff all over my body, I can't move, I am in deep pain, please help me" (pt is crying). Morphine 1mg  IV given. Raliegh Ip Schorr was notified. Will continue to monitor pt.   08/05/14 [id number]  Vitals  Temp 98.1 F (36.7 C)  Temp Source Oral  BP (!) 150/90 mmHg  BP Location Left Arm  BP Method Automatic  Patient Position (if appropriate) Lying  Pulse Rate 72  Pulse Rate Source Dinamap  Pain Assessment  Pain Assessment 0-10  Pain Score 10  Pain Type Acute pain  Pain Descriptors / Indicators Numbness (generalized, all over the body as pt stated)  Pain Frequency Intermittent  Pain Onset On-going  Pain Intervention(s) Medication (See eMAR)

## 2014-08-05 NOTE — Progress Notes (Signed)
PROGRESS NOTE  Eric [name] [id number] DOB: [birth year] DOA: 08/02/2014 PCP: [name], NP  HPI/Recap of past 24 hours: 45 oh male with past mental history of chronic systolic/diastolic heart failure, chronic kidney disease stage III, hepatitis C cirrhosis and diabetes mellitus admitted on early morning of 7/11 for acute respiratory failure secondary to CHF. Is also found to have markedly elevated blood pressures with systolic greater than [id number].  Patient admits to being noncompliant with his medications and says because his kidney function, he was told to stop them. Started on IV Lasix plus other medications. He also was complaining of bilateral leg pain that started a day or so before admission. Lower extremity Dopplers ruled out DVT, but did comment on fluid seen in both lower extremities.   CT of lower extremities notes subcutaneous edema,felt to be secondary to volume overload. Responded somewhat to Lasix and his diuresed over 2 L.patient himself feels like breathing is not very much better. He does note a little bit less leg pain and swelling.complains of a headache that started earlier.  Assessment/Plan: Principal Problem:   Acute respiratory failure with hypoxia secondary to Acute on chronic combined systolic and diastolic CHF (congestive heart failure) from ischemic cardiomyopathy: Continue aggressive Lasix. Supplemental oxygen. Down 1.8 L Echocardiogram done last month notes EF of 45% and grade 1 diastolic heart failure. Appreciate cardiology help.    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 Appears stable. We'll confirm patient has close follow-up   CKD (chronic kidney disease) stage 3, GFR 30-59 ml/min: creatinine staying steady despite aggressive diuresis    Hypertensive urgency: Scheduled hydralazine, Imdur and clonidine. Holding off on beta blocker until heart failure less decompensated. No Ace or ARB secondary to renal failure: Blood pressure responding well to  medications.    Diabetes mellitus type 2, controlled, continue sliding scale coverage, blood sugars are staying well-controlled area. A1c last month noting good control at 6.2.    Obesity (BMI 30-39.9): Patient meets criteria with BMI > 30   Bilateral leg pain: No DVT. Spoke with infectious disease and given lack of fever or white blood cell count, this is most certainly secondary to volume overload from CHF   Code Status: Full  Family Communication: Wife at the bedside  Disposition Plan: discharge once fully diuresed, perhaps over the weekend   Consultants:  Dr.Ganji-cardiology  Procedures:  None  Antibiotics:  None   Objective: BP 130/76 mmHg  Pulse 64  Temp(Src) 98 F (36.7 C) (Oral)  Resp 20  Ht 5\' 6"  (1.676 m)  Wt 89.132 kg (196 lb 8 oz)  BMI 31.73 kg/m2  SpO2 100%  Intake/Output Summary (Last 24 hours) at 08/05/14 1339 Last data filed at 08/05/14 0600  Gross per 24 hour  Intake    660 ml  Output    800 ml  Net   -140 ml   Filed Weights   08/03/14 0637 08/04/14 0609  Weight: 89.086 kg (196 lb 6.4 oz) 89.132 kg (196 lb 8 oz)    Exam:   General:  Alert and oriented 3, no acute distress  Cardiovascular: regular rhythm and rate 2/6 systolic ejection murmur  Respiratory: decreased breath sounds bibasilar  Abdomen: soft, nontender, nondistended, positive bowel sounds  Musculoskeletal: 2 pitting edema bilaterally , improving  Data Reviewed: Basic Metabolic Panel:  Recent Labs Lab 08/03/14 0011 08/03/14 0843 08/04/14 0300 08/05/14 0252  NA 136 137 133* 135  K 4.3 4.2 3.9 3.6  CL 108 109 104 103  CO2 22 21*  22 22  GLUCOSE 89 132* 166* 150*  BUN 43* 44* 52* 55*  CREATININE 2.97* 2.90* 3.29* 3.30*  CALCIUM 8.2* 8.4* 8.0* 8.2*  MG  --  2.0  --   --    Liver Function Tests:  Recent Labs Lab 08/03/14 0843  AST 27  ALT 24  ALKPHOS 69  BILITOT 0.5  PROT 8.1  ALBUMIN 2.1*   No results for input(s): LIPASE, AMYLASE in the last 168  hours. No results for input(s): AMMONIA in the last 168 hours. CBC:  Recent Labs Lab 08/03/14 0011 08/03/14 0843  WBC 9.5 8.2  NEUTROABS  --  5.5  HGB 10.1* 9.9*  HCT 29.2* 29.5*  MCV 87.4 87.8  PLT 198 195   Cardiac Enzymes:    Recent Labs Lab 08/03/14 0843 08/03/14 1545 08/03/14 1859  TROPONINI 0.08* 0.07* 0.07*   BNP (last 3 results)  Recent Labs  04/20/14 1125 06/29/14 1414 08/03/14 0012  BNP 125.3* 199.9* 952.6*    ProBNP (last 3 results)  Recent Labs  11/26/13 1300  PROBNP 452.5*    CBG:  Recent Labs Lab 08/04/14 1119 08/04/14 1555 08/04/14 2116 08/05/14 0617 08/05/14 1143  GLUCAP 123* 127* 180* 120* 131*    No results found for this or any previous visit (from the past 240 hour(s)).   Studies: No results found.  Scheduled Meds: . amLODipine  10 mg Oral Daily  . antiseptic oral rinse  7 mL Mouth Rinse BID  . aspirin EC  81 mg Oral Daily  . carvedilol  3.125 mg Oral BID WC  . cloNIDine  0.3 mg Oral 3 times per day  . enoxaparin (LOVENOX) injection  30 mg Subcutaneous Daily  . glipiZIDE  5 mg Oral QAC breakfast  . hydrALAZINE  25 mg Oral 3 times per day  . insulin aspart  0-9 Units Subcutaneous TID WC  . isosorbide mononitrate  30 mg Oral Daily  . metolazone  5 mg Oral Daily  . sodium chloride  3 mL Intravenous Q12H  . torsemide  40 mg Oral BID    Continuous Infusions:    Time spent: 25  min  [facility] Pager [phone]. If 7PM-7AM, please contact night-coverage at [email or web address], password [facility] 08/05/2014, 1:39 PM  LOS: 2 days

## 2014-08-06 LAB — BASIC METABOLIC PANEL
Anion gap: 9 (ref 5–15)
BUN: 61 mg/dL — AB (ref 6–20)
CO2: 24 mmol/L (ref 22–32)
Calcium: 7.9 mg/dL — ABNORMAL LOW (ref 8.9–10.3)
Chloride: 98 mmol/L — ABNORMAL LOW (ref 101–111)
Creatinine, Ser: 4.09 mg/dL — ABNORMAL HIGH (ref 0.61–1.24)
GFR calc Af Amer: 17 mL/min — ABNORMAL LOW (ref >60)
GFR, EST NON AFRICAN AMERICAN: 15 mL/min — AB (ref >60)
Glucose, Bld: 108 mg/dL — ABNORMAL HIGH (ref 65–99)
Potassium: 3.9 mmol/L (ref 3.5–5.1)
SODIUM: 131 mmol/L — AB (ref 135–145)

## 2014-08-06 LAB — GLUCOSE, CAPILLARY
Glucose-Capillary: 89 mg/dL (ref 65–99)
Glucose-Capillary: 126 mg/dL — ABNORMAL HIGH (ref 65–99)
Glucose-Capillary: 136 mg/dL — ABNORMAL HIGH (ref 65–99)

## 2014-08-06 MED ORDER — TORSEMIDE 20 MG PO TABS
20.0000 mg | ORAL_TABLET | Freq: Two times a day (BID) | ORAL | Status: DC
  Administered 2014-08-06 – 2014-08-08 (×4): 20 mg via ORAL
  Filled 2014-08-06 (×6): qty 1

## 2014-08-06 MED ORDER — CARVEDILOL 6.25 MG PO TABS
6.2500 mg | ORAL_TABLET | Freq: Two times a day (BID) | ORAL | Status: DC
  Administered 2014-08-06: 6.25 mg via ORAL
  Filled 2014-08-06 (×2): qty 1

## 2014-08-06 MED ORDER — CARVEDILOL 6.25 MG PO TABS
6.2500 mg | ORAL_TABLET | Freq: Two times a day (BID) | ORAL | Status: DC
  Administered 2014-08-07 – 2014-08-08 (×3): 6.25 mg via ORAL
  Filled 2014-08-06 (×5): qty 1

## 2014-08-06 MED ORDER — CLONIDINE HCL 0.1 MG PO TABS
0.1000 mg | ORAL_TABLET | Freq: Three times a day (TID) | ORAL | Status: DC
  Administered 2014-08-06 – 2014-08-08 (×6): 0.1 mg via ORAL
  Filled 2014-08-06 (×9): qty 1

## 2014-08-06 MED ORDER — BISACODYL 5 MG PO TBEC
10.0000 mg | DELAYED_RELEASE_TABLET | Freq: Once | ORAL | Status: AC
  Administered 2014-08-06: 10 mg via ORAL
  Filled 2014-08-06: qty 2

## 2014-08-06 NOTE — Progress Notes (Signed)
PROGRESS NOTE  Eric [name] [id number] DOB: May 28, 1955 DOA: 08/02/2014 PCP: [name], NP  HPI/Recap of past 24 hours: 16 oh male with past mental history of chronic systolic/diastolic heart failure, chronic kidney disease stage III, hepatitis C cirrhosis and diabetes mellitus admitted on early morning of 7/11 for acute respiratory failure secondary to CHF. Is also found to have markedly elevated blood pressures with systolic greater than 99991111.  Patient admits to being noncompliant with his medications and says because his kidney function, he was told to stop them. Started on IV Lasix plus other medications. He also was complaining of bilateral leg pain that started a day or so before admission. Lower extremity Dopplers ruled out DVT, but did comment on fluid seen in both lower extremities.   CT of lower extremities notes subcutaneous edema,felt to be secondary to volume overload. Responded somewhat to Lasix and his diuresed over 2.5 L. Today he starting to feel better with less leg swelling, leg pain and breathing a little bit easier. Creatinine has been trending up and so cardiology changed Lasix to Demadex.  Assessment/Plan: Principal Problem:   Acute respiratory failure with hypoxia secondary to Acute on chronic combined systolic and diastolic CHF (congestive heart failure) from ischemic cardiomyopathy: Continue diuresis. Breathing easier. Down 2.6 L and weight down 6 pounds. Echocardiogram done last month notes EF 45% and grade 1 diastolic dysfunction.    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 Appears stable. We'll confirm patient has close follow-up   Acute kidney injury in the setting of CKD (chronic kidney disease) stage 3, GFR 30-59 ml/min: Creatinine trending up. Changing Lasix to Demadex    Hypertensive urgency: Scheduled hydralazine, Imdur and clonidine. Holding off on beta blocker until heart failure less decompensated. No Ace or ARB secondary to renal failure: Blood  pressure responding well to medications.    Diabetes mellitus type 2, controlled, continue sliding scale coverage, blood sugars are staying well-controlled area. A1c last month noting good control at 6.2.    Obesity (BMI 30-39.9): Patient meets criteria with BMI > 30    Bilateral leg pain: No DVT. Spoke with infectious disease and given lack of fever or white blood cell count, this is most certainly secondary to volume overload from CHF, improved with diuresis   Code Status: Full  Family Communication: Wife at the bedside  Disposition Plan: Nearing dry weight. Cardiology states patient might be able to be discharged tomorrow if renal function trending downward  Antibiotics:  None   Objective: BP 116/62 mmHg  Pulse 59  Temp(Src) 97.3 F (36.3 C) (Oral)  Resp 20  Ht 5\' 6"  (1.676 m)  Wt 86.32 kg (190 lb 4.8 oz)  BMI 30.73 kg/m2  SpO2 100%  Intake/Output Summary (Last 24 hours) at 08/06/14 1506 Last data filed at 08/06/14 1100  Gross per 24 hour  Intake    415 ml  Output    500 ml  Net    -85 ml   Filed Weights   08/03/14 0637 08/04/14 0609 08/06/14 0456  Weight: 89.086 kg (196 lb 6.4 oz) 89.132 kg (196 lb 8 oz) 86.32 kg (190 lb 4.8 oz)    Exam:   General:  Alert and oriented 3, no acute distress  Cardiovascular: regular rhythm and rate 2/6 systolic ejection murmur  Respiratory: decreased breath sounds bibasilar, better inspiratory effort   Abdomen: soft, nontender, nondistended, positive bowel sounds Musculoskeletal:  1+ pitting edema bilaterally, continues to improve   Basic Metabolic Panel:  Recent Labs Lab 08/03/14  0011 08/03/14 0843 08/04/14 0300 08/05/14 0252 08/06/14 0405  NA 136 137 133* 135 131*  K 4.3 4.2 3.9 3.6 3.9  CL 108 109 104 103 98*  CO2 22 21* 22 22 24   GLUCOSE 89 132* 166* 150* 108*  BUN 43* 44* 52* 55* 61*  CREATININE 2.97* 2.90* 3.29* 3.30* 4.09*  CALCIUM 8.2* 8.4* 8.0* 8.2* 7.9*  MG  --  2.0  --   --   --    Liver Function  Tests:  Recent Labs Lab 08/03/14 0843  AST 27  ALT 24  ALKPHOS 69  BILITOT 0.5  PROT 8.1  ALBUMIN 2.1*   No results for input(s): LIPASE, AMYLASE in the last 168 hours. No results for input(s): AMMONIA in the last 168 hours. CBC:  Recent Labs Lab 08/03/14 0011 08/03/14 0843  WBC 9.5 8.2  NEUTROABS  --  5.5  HGB 10.1* 9.9*  HCT 29.2* 29.5*  MCV 87.4 87.8  PLT 198 195   Cardiac Enzymes:    Recent Labs Lab 08/03/14 0843 08/03/14 1545 08/03/14 1859  TROPONINI 0.08* 0.07* 0.07*   BNP (last 3 results)  Recent Labs  04/20/14 1125 06/29/14 1414 08/03/14 0012  BNP 125.3* 199.9* 952.6*    ProBNP (last 3 results)  Recent Labs  11/26/13 1300  PROBNP 452.5*    CBG:  Recent Labs Lab 08/05/14 1648 08/05/14 1722 08/05/14 2051 08/06/14 0624 08/06/14 1119  GLUCAP 79 82 112* 89 126*    No results found for this or any previous visit (from the past 240 hour(s)).   Studies: No results found.  Scheduled Meds: . amLODipine  10 mg Oral Daily  . antiseptic oral rinse  7 mL Mouth Rinse BID  . aspirin EC  81 mg Oral Daily  . carvedilol  6.25 mg Oral BID WC  . cloNIDine  0.1 mg Oral 3 times per day  . enoxaparin (LOVENOX) injection  30 mg Subcutaneous Daily  . glipiZIDE  5 mg Oral QAC breakfast  . hydrALAZINE  25 mg Oral 3 times per day  . insulin aspart  0-9 Units Subcutaneous TID WC  . isosorbide mononitrate  30 mg Oral Daily  . sodium chloride  3 mL Intravenous Q12H  . torsemide  20 mg Oral BID    Continuous Infusions:    Time spent: 15  min  [facility] Pager [phone]. If 7PM-7AM, please contact night-coverage at [email or web address], password [facility] 08/06/2014, 3:06 PM  LOS: 3 days

## 2014-08-06 NOTE — Progress Notes (Signed)
CARDIAC REHAB PHASE I   PRE:  Rate/Rhythm: 59 SR    BP: sitting 128/73    SaO2: 98 RA  MODE:  Ambulation: 500 ft   POST:  Rate/Rhythm: 65 SR    BP: sitting 115/64     SaO2: 100 RA  Pt moving well with RW. No c/o. Enjoys walking. Discussed daily wts, low sodium, taking meds, walking daily. Pt tries to show he knows about these things but unable to recall how much sodium a day, etc. Will need more reiteration. Encouraged him to read his booklet and diets.  [id number]   Josephina Shih [place] CES, ACSM 08/06/2014 3:34 PM

## 2014-08-06 NOTE — Evaluation (Signed)
Physical Therapy Evaluation Patient Details Name: Eric [name] MRN: [id number] DOB: 08/16/1955 Today's Date: 08/06/2014   History of Present Illness  Pt is a 59 y/o male with a PMH of CKD, DM, AICD, ischemic cardiomyopathy (EF 45-50% in 11/2013), hep C. Pt with CHF exacerbation , leg pain and fluid overload  Clinical Impression  Pt pleasant but with decreased problem solving, safety awareness and balance. He states no pain or difficulty with moving his legs today but unsteady gait and needs to continue to use RW at all times. Pt will benefit from acute therapy to maximize mobility, function, gait and balance to decrease fall risk. Pt educated for walking and HEp program as well as heart health and diet with CHF. Will continue to follow.     Follow Up Recommendations Outpatient PT (pt states he doesn't want HHPT, doesn't drive but would be agreeable to OPPT if community resources for transportation can take him)    Equipment Recommendations  None recommended by PT    Recommendations for Other Services       Precautions / Restrictions Precautions Precautions: Fall Restrictions Weight Bearing Restrictions: No      Mobility  Bed Mobility Overal bed mobility: Modified Independent             General bed mobility comments: with rail   Transfers Overall transfer level: Needs assistance   Transfers: Sit to/from Stand Sit to Stand: Min guard         General transfer comment: max cues for hand placement on bed as pt requesting RW to pull on   Ambulation/Gait Ambulation/Gait assistance: Min assist Ambulation Distance (Feet): 400 Feet Assistive device: None Gait Pattern/deviations: Step-through pattern;Decreased stride length;Staggering left;Staggering right   Gait velocity interpretation: Below normal speed for age/gender General Gait Details: pt with slow gait, unable to increase gait speed with multiple LOB reaching for rail and min assist to maintain balance despite  pt refusal to use RW with gait  Stairs            Wheelchair Mobility    Modified Rankin (Stroke Patients Only)       Balance Overall balance assessment: Needs assistance   Sitting balance-Leahy Scale: Good       Standing balance-Leahy Scale: Fair                               Pertinent Vitals/Pain Pain Assessment: No/denies pain    Home Living Family/patient expects to be discharged to:: Private residence Living Arrangements: Spouse/significant other Available Help at Discharge: Family;Available 24 hours/day Type of Home: House Home Access: Level entry     Home Layout: One level Home Equipment: Walker - 2 wheels      Prior Function Level of Independence: Independent         Comments: pt states he uses a RW sometimes     Hand Dominance        Extremity/Trunk Assessment   Upper Extremity Assessment: Overall WFL for tasks assessed           Lower Extremity Assessment: Overall WFL for tasks assessed      Cervical / Trunk Assessment: Normal  Communication   Communication: No difficulties  Cognition Arousal/Alertness: Awake/alert Behavior During Therapy: Flat affect Overall Cognitive Status: Impaired/Different from baseline Area of Impairment: Problem solving             Problem Solving: Slow processing;Requires verbal cues General Comments: repeated cues for hand  placement, decreased problem solving    General Comments      Exercises        Assessment/Plan    PT Assessment Patient needs continued PT services  PT Diagnosis Difficulty walking   PT Problem List Decreased strength;Decreased activity tolerance;Decreased balance;Decreased mobility;Decreased knowledge of use of DME  PT Treatment Interventions Gait training;DME instruction;Functional mobility training;Therapeutic exercise;Balance training;Patient/family education   PT Goals (Current goals can be found in the Care Plan section) Acute Rehab PT  Goals 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

## 2014-08-06 NOTE — Progress Notes (Signed)
Subjective:  Feels better. Dyspnea improved. Ambulated in hallway with rehab without much difficulty. Leg edema improved.  Objective:  Vital Signs in the last 24 hours: Temp:  [97.9 F (36.6 C)-98.3 F (36.8 C)] 97.9 F (36.6 C) (07/14 0456) Pulse Rate:  [59-66] 66 (07/14 0456) Resp:  [18-20] 20 (07/14 0456) BP: (90-130)/(57-74) 130/74 mmHg (07/14 0456) SpO2:  [96 %-100 %] 96 % (07/14 0456) Weight:  [86.32 kg (190 lb 4.8 oz)] 86.32 kg (190 lb 4.8 oz) (07/14 0456)  Intake/Output from previous day: 07/13 0701 - 07/14 0700 In: -  Out: 500 [Urine:500]  Physical Exam:  General appearance: alert, cooperative, appears older than stated age, no distress and mildly obese Neck: no adenopathy, no carotid bruit, supple, symmetrical, trachea midline, thyroid not enlarged, symmetric, no tenderness/mass/nodules and + JVD Lungs: diminished breath sounds bilaterally Chest wall: no tenderness Heart: regular rate and rhythm, S1, S2 normal, no murmur, click, rub or gallop Abdomen: abnormal findings: distended, obese and hepatomegaly Extremities: 2+ pedal edema bilaterally, 1+ lower leg edema Pulses: unable to palpate bilateral lower extremity pulses Skin: mobility and turgor normal or multiple small lesions over entire body in various stages of scarring and scabbing Neurologic: Grossly normal  Lab Results: BMP  Recent Labs  08/04/14 0300 08/05/14 0252 08/06/14 0405  NA 133* 135 131*  K 3.9 3.6 3.9  CL 104 103 98*  CO2 22 22 24   GLUCOSE 166* 150* 108*  BUN 52* 55* 61*  CREATININE 3.29* 3.30* 4.09*  CALCIUM 8.0* 8.2* 7.9*  GFRNONAA 19* 19* 15*  GFRAA 22* 22* 17*    CBC  Recent Labs Lab 08/03/14 0843  WBC 8.2  RBC 3.36*  HGB 9.9*  HCT 29.5*  PLT 195  MCV 87.8  MCH 29.5  MCHC 33.6  RDW 13.4  LYMPHSABS 1.9  MONOABS 0.6  EOSABS 0.1  BASOSABS 0.0    HEMOGLOBIN A1C Lab Results  Component Value Date   HGBA1C 6.2* 07/01/2014   MPG 131 07/01/2014    Cardiac Panel  (last 3 results)  Recent Labs  02/23/14 2212  06/29/14 2020  08/03/14 0843 08/03/14 1545 08/03/14 1859  CKTOTAL 285*  --  294  --   --   --   --   CKMB 4.0  --   --   --   --   --   --   TROPONINI 0.04*  < >  --   < > 0.08* 0.07* 0.07*  RELINDX 1.4  --   --   --   --   --   --   < > = values in this interval not displayed.  BNP (last 3 results)  Recent Labs  11/26/13 1300  PROBNP 452.5*    TSH  Recent Labs  06/29/14 2020 08/03/14 0843  TSH 2.800 2.421    CHOLESTEROL No results for input(s): CHOL in the last 8760 hours.  Hepatic Function Panel  Recent Labs  07/03/14 0359 07/05/14 0555 07/24/14 0855 08/03/14 0843  PROT 7.8  --  7.6 8.1  ALBUMIN 2.1* 2.4* 2.5* 2.1*  AST 22  --  22 27  ALT 26  --  23 24  ALKPHOS 56  --  68 69  BILITOT 0.4  --  0.4 0.5  BILIDIR <0.1*  --   --   --   IBILI NOT CALCULATED  --   --   --     Imaging: No results found.  Cardiac Studies:  EKG 08/03/2014: sinus rhythm at  a rate of 98bpm, normal axis, left atrial abnormality, PRWP, T-wave flattening and inversion in lateral leads, cannot exclude anterolateral infarct, old.  Echo 07/01/2014: Left ventricle: The cavity size was normal. There was mild concentric hypertrophy. Systolic function was mildly reduced. The estimated ejection fraction was in the range of 45% to 50%. Doppler parameters are consistent with abnormal left ventricular relaxation (grade 1 diastolic dysfunction). Atrial septum: No defect or patent foramen ovale was identified. Pulmonary arteries: Systolic pressure was moderately increased. PA peak pressure: 42 mm Hg (S).  Assessment/Plan:  1. Acute on chronic diastolic heart failure 2. CAD: H/O V. Fib cardiac arrest in February 2nd 2015 subsequently had cardiac catheterization noted to have distal diffuse distal LAD, RCA stenosis not a good candidate for PCI, Patent LIMA to LAD and patent SVG to obtuse marginal, and status post single-chamber ICD in 03/24/2013. 3.  Accelerated HTN 4. DM 2 uncontrolled 5. Hepatitis C with hepatic cirrhosis 6. CKD Stage IV, Acute on chronic kidney disease due to diuresis. 7. 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 Caryl Comes. 8. Moderate pulmonary hypertension by echocardiogram  Recommendation: Edema has improved,  Discontinue Zaroxolyn, decrease demadex to 20 mg BID. Increase Coreg to 6.25 mg BID and Decrease  Clonidine to 0.1 mg BID as BP is controlled and low.  Can be discharged tomorrow if the S. Cr improves, which I suspect it will with decreasing diuresis. Needs compliance stressed. I will be happy to follow up in the outpatient setting in 2 weeks. Used to see Dr. Terrence Dupont in the past, but did not want to f/u with him as he was telling patient to be compliant with medications, but now understands that he needs to take the medications and alter diet.  Adrian Prows, MD  08/06/2014, 10:09 AM [facility] Pager: [phone] Office: [phone]

## 2014-08-07 ENCOUNTER — Other Ambulatory Visit: Payer: Self-pay

## 2014-08-07 LAB — TROPONIN I

## 2014-08-07 LAB — BASIC METABOLIC PANEL
Anion gap: 9 (ref 5–15)
BUN: 60 mg/dL — ABNORMAL HIGH (ref 6–20)
CO2: 24 mmol/L (ref 22–32)
CREATININE: 3.8 mg/dL — AB (ref 0.61–1.24)
Calcium: 7.7 mg/dL — ABNORMAL LOW (ref 8.9–10.3)
Chloride: 99 mmol/L — ABNORMAL LOW (ref 101–111)
GFR calc Af Amer: 18 mL/min — ABNORMAL LOW (ref >60)
GFR, EST NON AFRICAN AMERICAN: 16 mL/min — AB (ref >60)
GLUCOSE: 172 mg/dL — AB (ref 65–99)
POTASSIUM: 3.6 mmol/L (ref 3.5–5.1)
SODIUM: 132 mmol/L — AB (ref 135–145)

## 2014-08-07 LAB — GLUCOSE, CAPILLARY
GLUCOSE-CAPILLARY: 115 mg/dL — AB (ref 65–99)
Glucose-Capillary: 135 mg/dL — ABNORMAL HIGH (ref 65–99)
Glucose-Capillary: 118 mg/dL — ABNORMAL HIGH (ref 65–99)
Glucose-Capillary: 117 mg/dL — ABNORMAL HIGH (ref 65–99)
Glucose-Capillary: 162 mg/dL — ABNORMAL HIGH (ref 65–99)

## 2014-08-07 MED ORDER — GI COCKTAIL ~~LOC~~
30.0000 mL | Freq: Three times a day (TID) | ORAL | Status: DC | PRN
  Administered 2014-08-07: 30 mL via ORAL
  Filled 2014-08-07 (×2): qty 30

## 2014-08-07 MED ORDER — MORPHINE SULFATE 2 MG/ML IJ SOLN
1.0000 mg | INTRAMUSCULAR | Status: AC
  Administered 2014-08-07: 1 mg via INTRAVENOUS

## 2014-08-07 MED ORDER — NITROGLYCERIN 0.4 MG SL SUBL
SUBLINGUAL_TABLET | SUBLINGUAL | Status: AC
  Administered 2014-08-07 (×2): 0.4 mg
  Filled 2014-08-07: qty 1

## 2014-08-07 MED ORDER — GUAIFENESIN-DM 100-10 MG/5ML PO SYRP
5.0000 mL | ORAL_SOLUTION | ORAL | Status: DC | PRN
  Administered 2014-08-07: 5 mL via ORAL
  Filled 2014-08-07: qty 5

## 2014-08-07 NOTE — Progress Notes (Signed)
PROGRESS NOTE  Eric [name] [id number] DOB: 1955/11/20 DOA: 08/02/2014 PCP: [name], NP  HPI/Recap of past 24 hours: 89 oh male with past mental history of chronic systolic/diastolic heart failure, chronic kidney disease stage III, hepatitis C cirrhosis and diabetes mellitus admitted on early morning of 7/11 for acute respiratory failure secondary to CHF. Is also found to have markedly elevated blood pressures with systolic greater than 99991111.  Patient admits to being noncompliant with his medications and says because his kidney function, he was told to stop them. Started on IV Lasix plus other medications. He also was complaining of bilateral leg pain that started a day or so before admission. Lower extremity Dopplers ruled out DVT, but did comment on fluid seen in both lower extremities.   CT of lower extremities notes subcutaneous edema,felt to be secondary to volume overload. Responded somewhat to Lasix and his diuresed over 2.5 L. Lasix was changed to [facility] with Cardiology recommendations to follow up in 2 weeks. The patient's renal function improved. The patient complained of continued LE edema. The patient noted chest pains not improved with ntg.  Assessment/Plan: Principal Problem:   Acute respiratory failure with hypoxia secondary to Acute on chronic combined systolic and diastolic CHF (congestive heart failure) from ischemic cardiomyopathy: Continue diuresis. Breathing easier. Pt has shown good diuresis since hospitalization. Echocardiogram done last month notes EF 45% and grade 1 diastolic dysfunction. Pt had been followed by Cardiology, who has signed off with recs for close outpatient follow up.  Chest Pain: -EKG unremarkable. 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 Appears stable. We'll confirm patient has close follow-up   Acute kidney injury in the setting of CKD (chronic kidney disease) stage 3, GFR 30-59 ml/min:  Have changed Lasix to Demadex    Hypertensive urgency: Scheduled hydralazine, Imdur and clonidine. Holding off on beta blocker until heart failure less decompensated. No Ace or ARB secondary to renal failure: Blood pressure responding well to medications.    Diabetes mellitus type 2, controlled, continue sliding scale coverage, blood sugars are staying well-controlled area. A1c last month noting good control at 6.2.    Obesity (BMI 30-39.9): Patient meets criteria with BMI > 30    Bilateral leg pain: No DVT. Most certainly secondary to volume overload from CHF, improved with diuresis   Code Status: Full  Family Communication: Wife at the bedside  Disposition Plan: Possible d/c 7/16 if renal function continues to improve and pt's trop are serially negative  Antibiotics:  None   Objective: BP 141/82 mmHg  Pulse 66  Temp(Src) 98.4 F (36.9 C) (Oral)  Resp 16  Ht 5\' 6"  (1.676 m)  Wt 84.505 kg (186 lb 4.8 oz)  BMI 30.08 kg/m2  SpO2 100%  Intake/Output Summary (Last 24 hours) at 08/07/14 1654 Last data filed at 08/07/14 1545  Gross per 24 hour  Intake   1440 ml  Output    825 ml  Net    615 ml   Filed Weights   08/04/14 0609 08/06/14 0456 08/07/14 0519  Weight: 89.132 kg (196 lb 8 oz) 86.32 kg (190 lb 4.8 oz) 84.505 kg (186 lb 4.8 oz)    Exam:   General:  Alert and oriented 3, no acute distress, laying in bed  Cardiovascular: regular rhythm and rate 2/6 systolic ejection murmur  Respiratory: decreased breath sounds bibasilar, better inspiratory effort   Abdomen: soft, nontender, nondistended, positive bowel sounds Musculoskeletal:  1+ pitting edema bilaterally, perfused   Basic Metabolic Panel:  Recent Labs Lab 08/03/14 0843 08/04/14 0300 08/05/14 0252 08/06/14 0405 08/07/14 0916  NA 137 133* 135 131* 132*  K 4.2 3.9 3.6 3.9 3.6  CL 109 104 103 98* 99*  CO2 21* 22 22 24 24   GLUCOSE 132* 166* 150* 108* 172*  BUN 44* 52* 55* 61* 60*  CREATININE 2.90*  3.29* 3.30* 4.09* 3.80*  CALCIUM 8.4* 8.0* 8.2* 7.9* 7.7*  MG 2.0  --   --   --   --    Liver Function Tests:  Recent Labs Lab 08/03/14 0843  AST 27  ALT 24  ALKPHOS 69  BILITOT 0.5  PROT 8.1  ALBUMIN 2.1*   No results for input(s): LIPASE, AMYLASE in the last 168 hours. No results for input(s): AMMONIA in the last 168 hours. CBC:  Recent Labs Lab 08/03/14 0011 08/03/14 0843  WBC 9.5 8.2  NEUTROABS  --  5.5  HGB 10.1* 9.9*  HCT 29.2* 29.5*  MCV 87.4 87.8  PLT 198 195   Cardiac Enzymes:    Recent Labs Lab 08/03/14 0843 08/03/14 1545 08/03/14 1859  TROPONINI 0.08* 0.07* 0.07*   BNP (last 3 results)  Recent Labs  04/20/14 1125 06/29/14 1414 08/03/14 0012  BNP 125.3* 199.9* 952.6*    ProBNP (last 3 results)  Recent Labs  11/26/13 1300  PROBNP 452.5*    CBG:  Recent Labs Lab 08/06/14 1644 08/06/14 2107 08/07/14 0654 08/07/14 1118 08/07/14 1606  GLUCAP 136* 135* 118* 115* 117*    No results found for this or any previous visit (from the past 240 hour(s)).   Studies: No results found.  Scheduled Meds: . amLODipine  10 mg Oral Daily  . antiseptic oral rinse  7 mL Mouth Rinse BID  . aspirin EC  81 mg Oral Daily  . carvedilol  6.25 mg Oral BID WC  . cloNIDine  0.1 mg Oral 3 times per day  . enoxaparin (LOVENOX) injection  30 mg Subcutaneous Daily  . glipiZIDE  5 mg Oral QAC breakfast  . hydrALAZINE  25 mg Oral 3 times per day  . insulin aspart  0-9 Units Subcutaneous TID WC  . isosorbide mononitrate  30 mg Oral Daily  . sodium chloride  3 mL Intravenous Q12H  . torsemide  20 mg Oral BID    Continuous Infusions:   CHIU, STEPHEN K  [facility] Pager [phone]. If 7PM-7AM, please contact night-coverage at [email or web address], password [email or web address] 08/07/2014, 4:54 PM  LOS: 4 days

## 2014-08-07 NOTE — Progress Notes (Signed)
CARDIAC REHAB PHASE I   PRE:  Rate/Rhythm: 63 SR  BP:  Supine:   Sitting: 117/71  Standing:    SaO2: 99%RA  MODE:  Ambulation: 100 ft   POST:  Rate/Rhythm: 73 SR  BP:  Supine:   Sitting: 128/77  Standing:    SaO2: 99%RA 1442-1505 Pt stated not feeling well today. Willing to try to walk. Walked 100 ft with rolling walker as compared to 500 ft yesterday. Pt c/o lower back pain, left upper chest soreness, legs feel like balloons. Pt stopped several times to bend over walker and he squatted a couple of times to stretch. To recliner after walk. Pt stated if he is sent home he is just going to come back as he feels so bad. Did not review ed this afternoon since pt not feeling well. Wife in room.    Graylon Good, RN BSN  08/07/2014 3:02 PM

## 2014-08-07 NOTE — Progress Notes (Signed)
Pt c/o L chest pain, EKG done and showed NSR, with prolonged QTC. Pt on O2 at 2LPM. Schorr, made aware. We will page [facility] on call. We will continue to monitor.

## 2014-08-07 NOTE — Care Management Note (Signed)
Case Management Note  Patient Details  Name: Eric [name] MRN: [id number] Date of Birth: 29-Mar-1955  Action/Plan: Talked to patient at length about DCP; patient is agreeable to [facility] services with [facility] for the Disease Management Program for CHF, spouse at bedside. Mary with [facility] talked to patient to explain the services that he will be getting a few days ago.  Expected Discharge Date:   possibly 08/10/2014               Expected Discharge Plan:  [place]  Discharge planning Services  CM Consult   Choice offered to:  Patient  HH Arranged:  Disease Management [place] Agency:  Other - See comment  Status of Service:  In process, will continue to follow  Sherrilyn Rist [id number] 08/07/2014, 10:01 AM

## 2014-08-07 NOTE — Progress Notes (Signed)
Pt c/o back and leg pain.  Admin Morphine 2mg  IV.  Pt then began c/o chest pain.  EKG resulted NSR.  141/82; HR 66.  O2 sats 100% on 3L.  Admin Nitro 0.4mg  SL.  Pt still c/o chest pain.  Notified Bridgett Ebony Hail, PA-C who stated that cardiology has signed off and that she feels that his pain is not cardiac related, possibly GI.  Called Dr. Wyline Copas who will review and place orders.

## 2014-08-08 DIAGNOSIS — I5043 Acute on chronic combined systolic (congestive) and diastolic (congestive) heart failure: Principal | ICD-10-CM

## 2014-08-08 LAB — BASIC METABOLIC PANEL
Anion gap: 7 (ref 5–15)
BUN: 65 mg/dL — AB (ref 6–20)
CHLORIDE: 98 mmol/L — AB (ref 101–111)
CO2: 27 mmol/L (ref 22–32)
CREATININE: 3.84 mg/dL — AB (ref 0.61–1.24)
Calcium: 7.9 mg/dL — ABNORMAL LOW (ref 8.9–10.3)
GFR calc non Af Amer: 16 mL/min — ABNORMAL LOW (ref >60)
GFR, EST AFRICAN AMERICAN: 18 mL/min — AB (ref >60)
GLUCOSE: 85 mg/dL (ref 65–99)
POTASSIUM: 3.5 mmol/L (ref 3.5–5.1)
Sodium: 132 mmol/L — ABNORMAL LOW (ref 135–145)

## 2014-08-08 LAB — GLUCOSE, CAPILLARY
GLUCOSE-CAPILLARY: 89 mg/dL (ref 65–99)
Glucose-Capillary: 62 mg/dL — ABNORMAL LOW (ref 65–99)

## 2014-08-08 LAB — TROPONIN I

## 2014-08-08 MED ORDER — CLONIDINE HCL 0.1 MG PO TABS: 0.1000 mg | ORAL_TABLET | Freq: Three times a day (TID) | ORAL | Status: DC

## 2014-08-08 MED ORDER — AMLODIPINE BESYLATE 10 MG PO TABS: 10.0000 mg | ORAL_TABLET | Freq: Every day | ORAL | Status: DC

## 2014-08-08 MED ORDER — GLIPIZIDE 5 MG PO TABS: 5.0000 mg | ORAL_TABLET | Freq: Every day | ORAL | Status: DC

## 2014-08-08 MED ORDER — HYDRALAZINE HCL 25 MG PO TABS: 25.0000 mg | ORAL_TABLET | Freq: Three times a day (TID) | ORAL | Status: DC

## 2014-08-08 MED ORDER — ISOSORBIDE MONONITRATE ER 30 MG PO TB24: 30.0000 mg | ORAL_TABLET | Freq: Every day | ORAL | Status: DC

## 2014-08-08 MED ORDER — CARVEDILOL 6.25 MG PO TABS: 6.2500 mg | ORAL_TABLET | Freq: Two times a day (BID) | ORAL | Status: DC

## 2014-08-08 MED ORDER — TORSEMIDE 20 MG PO TABS: 20.0000 mg | ORAL_TABLET | Freq: Two times a day (BID) | ORAL | Status: DC

## 2014-08-08 NOTE — Discharge Instructions (Signed)

## 2014-08-08 NOTE — Discharge Summary (Signed)
Physician Discharge Summary  Eric [name] [id number] DOB: 12/31/1955 DOA: 08/02/2014  PCP: Eric Perna, NP  Admit date: 08/02/2014 Discharge date: 08/08/2014  Recommendations for Outpatient Follow-up:  1. Pt will need to follow up with PCP in 2-3 weeks post discharge 2. Please obtain BMP to evaluate electrolytes and kidney function 3. Please also check CBC to evaluate Hg and Hct levels   Discharge Diagnoses:  Principal Problem:   Acute on chronic combined systolic and diastolic CHF (congestive heart failure) Active Problems:   Cardiomyopathy, ischemic   Cirrhosis   CKD (chronic kidney disease) stage 3, GFR 30-59 ml/min   Hypertensive urgency   Diabetes mellitus type 2, controlled   Obesity (BMI 30-39.9)   Bilateral leg pain   Discharge Condition: Stable  Diet recommendation: Heart healthy diet discussed in details   HPI 85 oh male with past history of chronic systolic/diastolic heart failure, chronic kidney disease stage III, hepatitis C cirrhosis and diabetes mellitus admitted on early morning of 7/11 for acute respiratory failure secondary to CHF. Is also found to have markedly elevated blood pressures with systolic greater than 99991111. Patient admits to being noncompliant with his medications and says because his kidney function, he was told to stop them.   Assessment/Plan: Principal Problem:  Acute respiratory failure with hypoxia secondary to Acute on chronic combined systolic and diastolic CHF (congestive heart failure) from ischemic cardiomyopathy: Continue diuresis with Torsemide BID upon discharge. Pt has shown good diuresis since hospitalization. Echocardiogram done last month notes EF 45% and grade 1 diastolic dysfunction. Pt had been followed by Cardiology, who has signed off with recs for close outpatient follow up.  Chest Pain: - EKG unremarkable. 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 - Appears stable.  Acute kidney injury in  the setting of CKD (chronic kidney disease) stage 3, GFR 30-59 ml/min - Have changed Lasix to Demadex  Hypertensive urgency:  - resume home medical regimen   Diabetes mellitus type 2, controlled - A1c last month noting good control at 6.2. - continue Glipizide   Obesity (BMI 30-39.9):  - Patient meets criteria with BMI > 30  Bilateral leg pain:  - No DVT. Most certainly secondary to volume overload from CHF, improved with diuresis  Code Status: Full  Discharge Exam: Filed Vitals:   08/08/14 0626  BP: 135/77  Pulse: 67  Temp: 97.3 F (36.3 C)  Resp: 16   Filed Vitals:   08/07/14 1543 08/07/14 1741 08/07/14 2148 08/08/14 0626  BP: 141/82 140/77 139/67 135/77  Pulse: 66 76 72 67  Temp:   98.2 F (36.8 C) 97.3 F (36.3 C)  TempSrc:   Oral Oral  Resp:   20 16  Height:      Weight:    82.827 kg (182 lb 9.6 oz)  SpO2: 100%  100% 98%    General: Pt is alert, follows commands appropriately, not in acute distress Cardiovascular: Regular rate and rhythm, no rubs, no gallops Respiratory: Clear to auscultation bilaterally, no wheezing, no crackles, no rhonchi Abdominal: Soft, non tender, non distended, bowel sounds +, no guarding Extremities: no cyanosis, pulses palpable bilaterally DP and PT  Discharge Instructions  Discharge Instructions    Diet - low sodium heart healthy    Complete by:  As directed      Increase activity slowly    Complete by:  As directed             Medication List    TAKE these  medications        amLODipine 10 MG tablet  Commonly known as:  NORVASC  Take 1 tablet (10 mg total) by mouth daily.     aspirin EC 81 MG tablet  Take 81 mg by mouth daily.     carvedilol 6.25 MG tablet  Commonly known as:  COREG  Take 1 tablet (6.25 mg total) by mouth 2 (two) times daily with a meal.     cloNIDine 0.1 MG tablet  Commonly known as:  CATAPRES  Take 1 tablet (0.1 mg total) by mouth 3 (three) times daily.     glipiZIDE 5 MG tablet  Commonly  known as:  GLUCOTROL  Take 1 tablet (5 mg total) by mouth daily before breakfast.     hydrALAZINE 25 MG tablet  Commonly known as:  APRESOLINE  Take 1 tablet (25 mg total) by mouth every 8 (eight) hours.     isosorbide mononitrate 30 MG 24 hr tablet  Commonly known as:  IMDUR  Take 1 tablet (30 mg total) by mouth daily.     torsemide 20 MG tablet  Commonly known as:  DEMADEX  Take 1 tablet (20 mg total) by mouth 2 (two) times daily.             Follow-up Information    Follow up with Rachel Bo, NP On 08/20/2014.   Specialty:  Nurse Practitioner   Why:  11 am   Contact information:   [place] [address] [phone]       Follow up with [name], MICHELLE P, NP.   Specialty:  Internal Medicine   Contact information:   [address] [place] [address] [phone]       Call Faye Ramsay, MD.   Specialty:  Internal Medicine   Why:  As needed call my cell phone [phone]   Contact information:   [address] [place] [address] [phone]        The results of significant diagnostics from this hospitalization (including imaging, microbiology, ancillary and laboratory) are listed below for reference.     Microbiology: No results found for this or any previous visit (from the past 240 hour(s)).   Labs: Basic Metabolic Panel:  Recent Labs Lab 08/03/14 0843 08/04/14 0300 08/05/14 0252 08/06/14 0405 08/07/14 0916 08/08/14 0325  NA 137 133* 135 131* 132* 132*  K 4.2 3.9 3.6 3.9 3.6 3.5  CL 109 104 103 98* 99* 98*  CO2 21* 22 22 24 24 27   GLUCOSE 132* 166* 150* 108* 172* 85  BUN 44* 52* 55* 61* 60* 65*  CREATININE 2.90* 3.29* 3.30* 4.09* 3.80* 3.84*  CALCIUM 8.4* 8.0* 8.2* 7.9* 7.7* 7.9*  MG 2.0  --   --   --   --   --    Liver Function Tests:  Recent Labs Lab 08/03/14 0843  AST 27  ALT 24  ALKPHOS 69  BILITOT 0.5  PROT 8.1  ALBUMIN 2.1*   CBC:  Recent Labs Lab  08/03/14 0011 08/03/14 0843  WBC 9.5 8.2  NEUTROABS  --  5.5  HGB 10.1* 9.9*  HCT 29.2* 29.5*  MCV 87.4 87.8  PLT 198 195   Cardiac Enzymes:  Recent Labs Lab 08/03/14 1545 08/03/14 1859 08/07/14 1713 08/07/14 2151 08/08/14 0325  TROPONINI 0.07* 0.07* <0.03 <0.03 <0.03   BNP: BNP (last 3 results)  Recent Labs  04/20/14 1125 06/29/14 1414 08/03/14 0012  BNP 125.3* 199.9*  952.6*    ProBNP (last 3 results)  Recent Labs  11/26/13 1300  PROBNP 452.5*    CBG:  Recent Labs Lab 08/07/14 0654 08/07/14 1118 08/07/14 1606 08/07/14 2137 08/08/14 0540  GLUCAP 118* 115* 117* 162* 89   SIGNED: Time coordinating discharge: 30 minutes  MAGICK-MYERS, ISKRA, MD  [facility] 08/08/2014, 9:13 AM Pager [phone]  If 7PM-7AM, please contact night-coverage [email or web address] Password [email or web address]

## 2014-08-08 NOTE — Care Management Note (Signed)
Case Management Note  Patient Details  Name: Eric [name] MRN: [id number] Date of Birth: 1955/07/15  Subjective/Objective:                   Shortness of breath. Action/Plan:  discharge Expected Discharge Date:  08/08/14               Expected Discharge Plan:  [place]  In-House Referral:     Discharge planning Services  CM Consult, Medication Assistance  Post Acute Care Choice:    Choice offered to:  Patient  DME Arranged:    DME Agency:     HH Arranged:  Disease Management, RN [place] Agency:  Other - See comment  Status of Service:  Completed, signed off  Medicare Important Message Given:    Date Medicare IM Given:    Medicare IM give by:    Date Additional Medicare IM Given:    Additional Medicare Important Message give by:     If discussed at [place] of Stay Meetings, dates discussed:    Additional Comments: CM met with pt and spouse who state they cannot get the prescriptions until Tuesday. Cm text messaged MD to please provide prescriptions for 6 demedex, 9 apresoline, 3 imdur, 3 norvasc, and 6 coreg until his medications can be filled.  CM told by Charge RN, pt's RN received prescriptions and had them filled internally.  CM notified [facility] rep, Rhett Bannister of pt discharge.  NO other CM needs were communicated. Dellie Catholic, RN 08/08/2014, 1:38 PM

## 2014-08-17 ENCOUNTER — Other Ambulatory Visit: Payer: Self-pay | Admitting: *Deleted

## 2014-08-17 NOTE — Patient Outreach (Signed)
[facility]) Care Management  08/17/2014  Gains [place] 1955/08/03 [id number]   EMMI-Heart Failure referral: Admission 07/10-07/16/2016  Telephone call to patient who was advised of reason for call and of telephonic case management program. Patient voices that he is already getting telephone calls and getting home health services for his condition of  heart failure. States he does not need any more services.   RN CM re-explained Heart Failure program and patient voices understanding but confirms that he does not wish to participate in telephonic program . States he is getting home health services which is sufficient.  Per Epic documentation patient is getting disease management services from [facility] home health services.  Plan: will close case.  Sherrin Daisy, RN BSN [place] Management Coordinator [facility] Care Management  [phone]

## 2014-08-17 NOTE — Patient Outreach (Signed)
[facility]) Care Management  08/17/2014  Eric [name] 1955-11-25 [id number]   RED on EMMI Heart Failure Dashboard, assigned Eric Daisy, RN to outreach.  Eric [name]. [place], Eric [name] Management [place] Assistant Phone: [phone] Fax: [phone]

## 2014-08-18 NOTE — Patient Outreach (Signed)
[facility]) Care Management  08/18/2014  Eric [name] [birth year] [id number]   RED on EMMI Heart Failure Dashboard, notification sent to Sherrin Daisy, RN.  [name]. [place], [place] Management [place] Assistant Phone: [phone] Fax: [phone]

## 2014-08-19 NOTE — Patient Outreach (Signed)
[facility]) Care Management  08/19/2014  Eric [name] [birth year] [id number]   Notification from Sherrin Daisy, RN to close case due to patient wishes to not participate in the [facility] Stroke program.  [name]. [place], [place] Management [place] Assistant Phone: [phone] Fax: [phone]

## 2014-09-04 ENCOUNTER — Ambulatory Visit: Payer: Medicaid Other | Admitting: Hematology & Oncology

## 2014-09-04 ENCOUNTER — Other Ambulatory Visit: Payer: Medicaid Other

## 2014-09-14 ENCOUNTER — Ambulatory Visit (INDEPENDENT_AMBULATORY_CARE_PROVIDER_SITE_OTHER): Payer: Medicaid Other | Admitting: *Deleted

## 2014-09-14 DIAGNOSIS — I469 Cardiac arrest, cause unspecified: Secondary | ICD-10-CM | POA: Diagnosis not present

## 2014-09-15 NOTE — Progress Notes (Signed)
Remote ICD transmission.   

## 2014-09-18 LAB — CUP PACEART REMOTE DEVICE CHECK
Battery Remaining Longevity: 88 mo
Battery Voltage: 3.05 V
Date Time Interrogation Session: 20160822060024
HIGH POWER IMPEDANCE MEASURED VALUE: 54 Ohm
HighPow Impedance: 54 Ohm
Lead Channel Pacing Threshold Pulse Width: 0.5 ms
Lead Channel Sensing Intrinsic Amplitude: 11.8 mV
Lead Channel Setting Pacing Pulse Width: 0.5 ms
MDC IDC MSMT BATTERY REMAINING PERCENTAGE: 87 %
MDC IDC MSMT LEADCHNL RV IMPEDANCE VALUE: 350 Ohm
MDC IDC MSMT LEADCHNL RV PACING THRESHOLD AMPLITUDE: 0.75 V
MDC IDC SET LEADCHNL RV PACING AMPLITUDE: 2.5 V
MDC IDC SET LEADCHNL RV SENSING SENSITIVITY: 0.5 mV
MDC IDC SET ZONE DETECTION INTERVAL: 300 ms
MDC IDC STAT BRADY RV PERCENT PACED: 1 %
Pulse Gen Serial Number: 7175540
Zone Setting Detection Interval: 250 ms
Zone Setting Detection Interval: 375 ms

## 2014-09-22 NOTE — Patient Outreach (Signed)
[facility]) Care Management  09/22/2014  Eric [name] 1955-05-18 [id number]   Patient triggered RED on EMMI Heart Failure dashboard, assigned Sherrin Daisy, RN to outreach.  Thanks, [name]. [place], [place] Assistant Phone: [phone] Fax: [phone]

## 2014-09-23 ENCOUNTER — Other Ambulatory Visit: Payer: Self-pay | Admitting: *Deleted

## 2014-09-23 NOTE — Patient Outreach (Signed)
[facility]) Care Management  09/23/2014  Eric [name] 01-05-1956 [id number]  [name]- referral; red dashboard follow up. Telephone call to patient; left message on voice mail. Plan: will follow up. Sherrin Daisy, RN BSN [place] Management Coordinator [facility] Care Management  [phone]

## 2014-09-24 ENCOUNTER — Other Ambulatory Visit: Payer: Self-pay | Admitting: *Deleted

## 2014-09-24 NOTE — Patient Outreach (Signed)
[facility]) Care Management  09/24/2014  Eric [name] [birth year] [id number]   Emmi Heart Failure referral;  Red on dashboard for not weighing daily , new swelling, new problem.  Telephone call to patient who was advised of reason for call and of weekly follow up. Patient states that he has dismissed home health services. States he has workable scales and was instructed to weigh daily but does not weigh daily. States he is aware of heart failure yellow and red zones and knows what action plans are if symptoms occur. States he saw specialist yesterday 09/23/2014 and there were no changes in his medications. Patient has questions regarding the purpose of weekly calls; he was advised.  Patient has requested not to receive any further calls and wishes to dis-enroll from HF program. Plan: Will close case and notify CMA. Sherrin Daisy, RN BSN [place] Management Coordinator [facility] Care Management  [phone]

## 2014-09-30 NOTE — Patient Outreach (Signed)
[facility]) Care Management  09/30/2014  Randel Narasimhan 27-Aug-1955 [id number]   Notification from Sherrin Daisy, RN to close case due to patient withdrew from [place] Management services.  Thanks, [name]. [place], [place] Assistant Phone: [phone] Fax: [phone]

## 2014-10-02 ENCOUNTER — Encounter: Payer: Self-pay | Admitting: Cardiology

## 2014-10-12 ENCOUNTER — Encounter: Payer: Self-pay | Admitting: Internal Medicine

## 2014-10-19 ENCOUNTER — Encounter: Payer: Self-pay | Admitting: Cardiology

## 2014-12-03 ENCOUNTER — Encounter: Payer: Self-pay | Admitting: Internal Medicine

## 2014-12-16 ENCOUNTER — Encounter: Payer: Medicaid Other | Admitting: *Deleted

## 2014-12-16 ENCOUNTER — Telehealth: Payer: Self-pay | Admitting: Cardiology

## 2014-12-16 NOTE — Telephone Encounter (Signed)
LMOVM reminding pt to send remote transmission.   

## 2014-12-21 ENCOUNTER — Encounter: Payer: Self-pay | Admitting: Cardiology

## 2014-12-22 ENCOUNTER — Ambulatory Visit (INDEPENDENT_AMBULATORY_CARE_PROVIDER_SITE_OTHER): Payer: Medicaid Other | Admitting: *Deleted

## 2014-12-22 DIAGNOSIS — I469 Cardiac arrest, cause unspecified: Secondary | ICD-10-CM

## 2014-12-24 NOTE — Progress Notes (Signed)
Remote ICD transmission.   

## 2015-01-04 LAB — CUP PACEART REMOTE DEVICE CHECK
Date Time Interrogation Session: 20161130023317
HIGH POWER IMPEDANCE MEASURED VALUE: 52 Ohm
HighPow Impedance: 52 Ohm
Implantable Lead Implant Date: 20150302
Implantable Lead Serial Number: 322161
Lead Channel Pacing Threshold Pulse Width: 0.5 ms
Lead Channel Setting Pacing Amplitude: 2.5 V
Lead Channel Setting Pacing Pulse Width: 0.5 ms
MDC IDC LEAD LOCATION: 753860
MDC IDC MSMT BATTERY REMAINING LONGEVITY: 85 mo
MDC IDC MSMT BATTERY REMAINING PERCENTAGE: 85 %
MDC IDC MSMT BATTERY VOLTAGE: 3.02 V
MDC IDC MSMT LEADCHNL RV IMPEDANCE VALUE: 350 Ohm
MDC IDC MSMT LEADCHNL RV PACING THRESHOLD AMPLITUDE: 0.75 V
MDC IDC MSMT LEADCHNL RV SENSING INTR AMPL: 11.8 mV
MDC IDC SET LEADCHNL RV SENSING SENSITIVITY: 0.5 mV
MDC IDC STAT BRADY RV PERCENT PACED: 1 %
Pulse Gen Serial Number: 7175540

## 2015-01-05 ENCOUNTER — Encounter: Payer: Self-pay | Admitting: Cardiology

## 2015-01-19 ENCOUNTER — Encounter: Payer: Self-pay | Admitting: Cardiology

## 2015-01-29 ENCOUNTER — Ambulatory Visit: Payer: Medicaid Other | Admitting: Hematology & Oncology

## 2015-01-29 ENCOUNTER — Other Ambulatory Visit: Payer: Medicaid Other

## 2015-03-10 ENCOUNTER — Encounter (HOSPITAL_COMMUNITY): Payer: Self-pay | Admitting: Vascular Surgery

## 2015-03-10 ENCOUNTER — Inpatient Hospital Stay (HOSPITAL_COMMUNITY)
Admission: EM | Admit: 2015-03-10 | Discharge: 2015-03-20 | DRG: 264 | Disposition: A | Discharge status: Home / Self Care | Payer: Medicaid Other | Attending: Internal Medicine | Admitting: Internal Medicine

## 2015-03-10 ENCOUNTER — Emergency Department (HOSPITAL_COMMUNITY): Payer: Medicaid Other

## 2015-03-10 DIAGNOSIS — Z419 Encounter for procedure for purposes other than remedying health state, unspecified: Secondary | ICD-10-CM

## 2015-03-10 DIAGNOSIS — R0602 Shortness of breath: Secondary | ICD-10-CM

## 2015-03-10 DIAGNOSIS — E1165 Type 2 diabetes mellitus with hyperglycemia: Secondary | ICD-10-CM | POA: Diagnosis present

## 2015-03-10 DIAGNOSIS — I10 Essential (primary) hypertension: Secondary | ICD-10-CM

## 2015-03-10 DIAGNOSIS — N189 Chronic kidney disease, unspecified: Secondary | ICD-10-CM

## 2015-03-10 DIAGNOSIS — E46 Unspecified protein-calorie malnutrition: Secondary | ICD-10-CM | POA: Diagnosis present

## 2015-03-10 DIAGNOSIS — B1920 Unspecified viral hepatitis C without hepatic coma: Secondary | ICD-10-CM | POA: Diagnosis present

## 2015-03-10 DIAGNOSIS — Z794 Long term (current) use of insulin: Secondary | ICD-10-CM

## 2015-03-10 DIAGNOSIS — E785 Hyperlipidemia, unspecified: Secondary | ICD-10-CM | POA: Diagnosis present

## 2015-03-10 DIAGNOSIS — Z95810 Presence of automatic (implantable) cardiac defibrillator: Secondary | ICD-10-CM

## 2015-03-10 DIAGNOSIS — N179 Acute kidney failure, unspecified: Secondary | ICD-10-CM | POA: Diagnosis present

## 2015-03-10 DIAGNOSIS — Z6831 Body mass index (BMI) 31.0-31.9, adult: Secondary | ICD-10-CM

## 2015-03-10 DIAGNOSIS — I132 Hypertensive heart and chronic kidney disease with heart failure and with stage 5 chronic kidney disease, or end stage renal disease: Principal | ICD-10-CM | POA: Diagnosis present

## 2015-03-10 DIAGNOSIS — N186 End stage renal disease: Secondary | ICD-10-CM | POA: Diagnosis present

## 2015-03-10 DIAGNOSIS — K7460 Unspecified cirrhosis of liver: Secondary | ICD-10-CM | POA: Diagnosis present

## 2015-03-10 DIAGNOSIS — I120 Hypertensive chronic kidney disease with stage 5 chronic kidney disease or end stage renal disease: Secondary | ICD-10-CM | POA: Diagnosis present

## 2015-03-10 DIAGNOSIS — K5900 Constipation, unspecified: Secondary | ICD-10-CM | POA: Diagnosis present

## 2015-03-10 DIAGNOSIS — Z87891 Personal history of nicotine dependence: Secondary | ICD-10-CM

## 2015-03-10 DIAGNOSIS — N183 Chronic kidney disease, stage 3 unspecified: Secondary | ICD-10-CM | POA: Diagnosis present

## 2015-03-10 DIAGNOSIS — Z6832 Body mass index (BMI) 32.0-32.9, adult: Secondary | ICD-10-CM | POA: Diagnosis not present

## 2015-03-10 DIAGNOSIS — Z8674 Personal history of sudden cardiac arrest: Secondary | ICD-10-CM | POA: Diagnosis not present

## 2015-03-10 DIAGNOSIS — E1140 Type 2 diabetes mellitus with diabetic neuropathy, unspecified: Secondary | ICD-10-CM | POA: Diagnosis present

## 2015-03-10 DIAGNOSIS — Z951 Presence of aortocoronary bypass graft: Secondary | ICD-10-CM | POA: Diagnosis not present

## 2015-03-10 DIAGNOSIS — B182 Chronic viral hepatitis C: Secondary | ICD-10-CM | POA: Diagnosis present

## 2015-03-10 DIAGNOSIS — I2510 Atherosclerotic heart disease of native coronary artery without angina pectoris: Secondary | ICD-10-CM | POA: Diagnosis present

## 2015-03-10 DIAGNOSIS — E118 Type 2 diabetes mellitus with unspecified complications: Secondary | ICD-10-CM

## 2015-03-10 DIAGNOSIS — I5043 Acute on chronic combined systolic (congestive) and diastolic (congestive) heart failure: Secondary | ICD-10-CM | POA: Diagnosis present

## 2015-03-10 DIAGNOSIS — Z992 Dependence on renal dialysis: Secondary | ICD-10-CM | POA: Diagnosis not present

## 2015-03-10 DIAGNOSIS — Z79899 Other long term (current) drug therapy: Secondary | ICD-10-CM

## 2015-03-10 DIAGNOSIS — I509 Heart failure, unspecified: Secondary | ICD-10-CM | POA: Diagnosis not present

## 2015-03-10 DIAGNOSIS — I252 Old myocardial infarction: Secondary | ICD-10-CM | POA: Diagnosis not present

## 2015-03-10 DIAGNOSIS — N2581 Secondary hyperparathyroidism of renal origin: Secondary | ICD-10-CM | POA: Diagnosis present

## 2015-03-10 DIAGNOSIS — E1122 Type 2 diabetes mellitus with diabetic chronic kidney disease: Secondary | ICD-10-CM | POA: Diagnosis present

## 2015-03-10 DIAGNOSIS — R079 Chest pain, unspecified: Secondary | ICD-10-CM | POA: Diagnosis present

## 2015-03-10 DIAGNOSIS — N185 Chronic kidney disease, stage 5: Secondary | ICD-10-CM | POA: Diagnosis not present

## 2015-03-10 DIAGNOSIS — E119 Type 2 diabetes mellitus without complications: Secondary | ICD-10-CM

## 2015-03-10 DIAGNOSIS — D649 Anemia, unspecified: Secondary | ICD-10-CM | POA: Diagnosis present

## 2015-03-10 DIAGNOSIS — Z7982 Long term (current) use of aspirin: Secondary | ICD-10-CM | POA: Diagnosis not present

## 2015-03-10 DIAGNOSIS — D472 Monoclonal gammopathy: Secondary | ICD-10-CM | POA: Diagnosis present

## 2015-03-10 DIAGNOSIS — D638 Anemia in other chronic diseases classified elsewhere: Secondary | ICD-10-CM | POA: Diagnosis present

## 2015-03-10 LAB — I-STAT TROPONIN, ED
TROPONIN I, POC: 0.08 ng/mL (ref 0.00–0.08)
TROPONIN I, POC: 0.09 ng/mL — AB (ref 0.00–0.08)

## 2015-03-10 LAB — CBC
HCT: 21.8 % — ABNORMAL LOW (ref 39.0–52.0)
HCT: 21.9 % — ABNORMAL LOW (ref 39.0–52.0)
HEMOGLOBIN: 7.3 g/dL — AB (ref 13.0–17.0)
Hemoglobin: 7.2 g/dL — ABNORMAL LOW (ref 13.0–17.0)
MCH: 28.9 pg (ref 26.0–34.0)
MCH: 28.6 pg (ref 26.0–34.0)
MCHC: 33.5 g/dL (ref 30.0–36.0)
MCHC: 32.9 g/dL (ref 30.0–36.0)
MCV: 86.2 fL (ref 78.0–100.0)
MCV: 86.9 fL (ref 78.0–100.0)
PLATELETS: 176 10*3/uL (ref 150–400)
Platelets: 189 10*3/uL (ref 150–400)
RBC: 2.53 MIL/uL — ABNORMAL LOW (ref 4.22–5.81)
RBC: 2.52 MIL/uL — ABNORMAL LOW (ref 4.22–5.81)
RDW: 13.3 % (ref 11.5–15.5)
RDW: 13.3 % (ref 11.5–15.5)
WBC: 8.2 10*3/uL (ref 4.0–10.5)
WBC: 7.8 10*3/uL (ref 4.0–10.5)

## 2015-03-10 LAB — CREATININE, SERUM
Creatinine, Ser: 6.13 mg/dL — ABNORMAL HIGH (ref 0.61–1.24)
GFR calc non Af Amer: 9 mL/min — ABNORMAL LOW (ref >60)
GFR, EST AFRICAN AMERICAN: 10 mL/min — AB (ref >60)

## 2015-03-10 LAB — HEPATIC FUNCTION PANEL
ALK PHOS: 61 U/L (ref 38–126)
ALT: 35 U/L (ref 17–63)
AST: 24 U/L (ref 15–41)
Albumin: 1.8 g/dL — ABNORMAL LOW (ref 3.5–5.0)
BILIRUBIN TOTAL: 0.4 mg/dL (ref 0.3–1.2)
Total Protein: 8 g/dL (ref 6.5–8.1)

## 2015-03-10 LAB — BASIC METABOLIC PANEL
Anion gap: 14 (ref 5–15)
BUN: 83 mg/dL — ABNORMAL HIGH (ref 6–20)
CO2: 17 mmol/L — AB (ref 22–32)
Calcium: 6.6 mg/dL — ABNORMAL LOW (ref 8.9–10.3)
Chloride: 108 mmol/L (ref 101–111)
Creatinine, Ser: 5.92 mg/dL — ABNORMAL HIGH (ref 0.61–1.24)
GFR calc Af Amer: 11 mL/min — ABNORMAL LOW (ref >60)
GFR calc non Af Amer: 9 mL/min — ABNORMAL LOW (ref >60)
Glucose, Bld: 138 mg/dL — ABNORMAL HIGH (ref 65–99)
POTASSIUM: 4.2 mmol/L (ref 3.5–5.1)
SODIUM: 139 mmol/L (ref 135–145)

## 2015-03-10 LAB — TROPONIN I: Troponin I: 0.06 ng/mL — ABNORMAL HIGH (ref <0.031)

## 2015-03-10 LAB — POC OCCULT BLOOD, ED: Fecal Occult Bld: NEGATIVE

## 2015-03-10 LAB — BRAIN NATRIURETIC PEPTIDE: B Natriuretic Peptide: 1086.9 pg/mL — ABNORMAL HIGH (ref 0.0–100.0)

## 2015-03-10 MED ORDER — FUROSEMIDE 10 MG/ML IJ SOLN
80.0000 mg | Freq: Two times a day (BID) | INTRAMUSCULAR | Status: DC
  Administered 2015-03-11 – 2015-03-12 (×3): 80 mg via INTRAVENOUS
  Filled 2015-03-10 (×3): qty 8

## 2015-03-10 MED ORDER — CLONIDINE HCL 0.1 MG PO TABS
0.1000 mg | ORAL_TABLET | Freq: Three times a day (TID) | ORAL | Status: DC
  Administered 2015-03-10 – 2015-03-19 (×24): 0.1 mg via ORAL
  Filled 2015-03-10 (×26): qty 1

## 2015-03-10 MED ORDER — ISOSORBIDE MONONITRATE ER 60 MG PO TB24
60.0000 mg | ORAL_TABLET | Freq: Every morning | ORAL | Status: DC
  Administered 2015-03-11 – 2015-03-20 (×8): 60 mg via ORAL
  Filled 2015-03-10 (×11): qty 1

## 2015-03-10 MED ORDER — ONDANSETRON HCL 4 MG/2ML IJ SOLN: 4.0000 mg | Freq: Four times a day (QID) | INTRAMUSCULAR | Status: DC | PRN

## 2015-03-10 MED ORDER — SODIUM CHLORIDE 0.9% FLUSH: 3.0000 mL | INTRAVENOUS | Status: DC | PRN

## 2015-03-10 MED ORDER — HEPARIN SODIUM (PORCINE) 5000 UNIT/ML IJ SOLN
5000.0000 [IU] | Freq: Three times a day (TID) | INTRAMUSCULAR | Status: DC
  Administered 2015-03-10 – 2015-03-20 (×23): 5000 [IU] via SUBCUTANEOUS
  Filled 2015-03-10 (×26): qty 1

## 2015-03-10 MED ORDER — FUROSEMIDE 10 MG/ML IJ SOLN
40.0000 mg | Freq: Once | INTRAMUSCULAR | Status: AC
  Administered 2015-03-10: 40 mg via INTRAVENOUS
  Filled 2015-03-10: qty 4

## 2015-03-10 MED ORDER — CARVEDILOL 6.25 MG PO TABS
6.2500 mg | ORAL_TABLET | Freq: Two times a day (BID) | ORAL | Status: DC
  Administered 2015-03-11 – 2015-03-19 (×14): 6.25 mg via ORAL
  Filled 2015-03-10 (×14): qty 1
  Filled 2015-03-10: qty 2
  Filled 2015-03-10: qty 1

## 2015-03-10 MED ORDER — SODIUM CHLORIDE 0.9% FLUSH
3.0000 mL | Freq: Two times a day (BID) | INTRAVENOUS | Status: DC
  Administered 2015-03-10 – 2015-03-20 (×16): 3 mL via INTRAVENOUS

## 2015-03-10 MED ORDER — ASPIRIN EC 81 MG PO TBEC
81.0000 mg | DELAYED_RELEASE_TABLET | Freq: Every day | ORAL | Status: DC
  Administered 2015-03-11 – 2015-03-20 (×8): 81 mg via ORAL
  Filled 2015-03-10 (×11): qty 1

## 2015-03-10 MED ORDER — ASPIRIN 81 MG PO CHEW
324.0000 mg | CHEWABLE_TABLET | Freq: Once | ORAL | Status: AC
  Administered 2015-03-10: 324 mg via ORAL
  Filled 2015-03-10: qty 4

## 2015-03-10 MED ORDER — HYDRALAZINE HCL 25 MG PO TABS
25.0000 mg | ORAL_TABLET | Freq: Three times a day (TID) | ORAL | Status: DC
  Administered 2015-03-10 – 2015-03-20 (×27): 25 mg via ORAL
  Filled 2015-03-10 (×28): qty 1

## 2015-03-10 MED ORDER — ACETAMINOPHEN 325 MG PO TABS
650.0000 mg | ORAL_TABLET | ORAL | Status: DC | PRN
  Administered 2015-03-13 – 2015-03-18 (×2): 650 mg via ORAL
  Filled 2015-03-10 (×2): qty 2

## 2015-03-10 MED ORDER — INSULIN ASPART 100 UNIT/ML ~~LOC~~ SOLN
0.0000 [IU] | Freq: Three times a day (TID) | SUBCUTANEOUS | Status: DC
  Administered 2015-03-11 – 2015-03-12 (×2): 1 [IU] via SUBCUTANEOUS

## 2015-03-10 NOTE — ED Notes (Signed)
Pt ambulatory to restroom with steady gait.

## 2015-03-10 NOTE — H&P (Signed)
[facility] History and Physical                                                                                    Eric [name], is a 60 y.o. male  MRN: [id number]   DOB - 02-10-1955  Admit Date - 03/10/2015  Outpatient Primary MD for the patient is [name], [name], NP  Cardiology: Dr Nadyne Coombes Nephrology: Dr Mercy Moore  With History of -  Past Medical History  Diagnosis Date  . Hypertension   . Hyperlipemia   . Edema   . 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 Obesity   . Ventricular tachycardia ([place])   . AICD (automatic cardioverter/defibrillator) present   . CHF (congestive heart failure) ([place])   . Myocardial infarction ([place]) 03/2013  . Pneumonia 2016  . History of blood transfusion 1950's    "related to pinal menigitis; HAD 16 OPERATIONS TOTAL"  . Scabies infestation 06/29/2014  . Shortness of breath dyspnea   . Type II diabetes mellitus ([place])     type 2  . CKD (chronic kidney disease), stage III       Past Surgical History  Procedure Laterality Date  . Implantable cardioverter defibrillator implant  03/24/2013    STJ single chamber ICD implanted by Dr Caryl Comes for secondary prevention  . Left heart catheterization with coronary/graft angiogram  03/07/2013    Procedure: LEFT HEART CATHETERIZATION WITH [name];  Surgeon: Clent Demark, MD;  Location: [facility] CATH LAB;  Service: Cardiovascular;;  . Implantable cardioverter defibrillator implant N/A 03/24/2013    Procedure: IMPLANTABLE CARDIOVERTER DEFIBRILLATOR IMPLANT;  Surgeon: Deboraha Sprang, MD;  Location: [facility] CATH LAB;  Service: Cardiovascular;  Laterality: N/A;  . Cardiac catheterization    . Head surgery  1950'S    "for spinal menigitis; HAD 16 OPERATIONS TOTAL"  . Back surgery  1950's    "for spinal menigitis; HAD 16 OPERATIONS TOTAL"  . 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 Coronary artery bypass graft  1999    cabg x4    in for    Chief Complaint  Patient presents with  . Chest Pain  . Shortness of Breath     HPI  Eric [name]  is a 60 y.o. 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 Pt presents today with complaints of chest pain and shortness of breath for several days becoming increasingly worse prompting visit to ED.    Pt states he is compliant with home meds. He denies recent fever, dizziness or palpitations, abdominal pain, n/v/d. He states he does feel constipated. ED evaluation consistent with acute HF exacerbation, creatinine up to 5.9 from baseline around 3, Hgb 7.3. Pt to be admitted for further evaluation and treatment.   Review of Systems   In addition to the HPI above,  No Fever-chills, No Headache, No changes with Vision or hearing, No problems swallowing food or Liquids, No Abdominal pain, No Nausea or Vomiting, +constipation No Blood in stool  or Urine, No dysuria, No new skin rashes or bruises, No new joints pains-aches,  No new weakness, tingling, numbness in any extremity, A full 10 point Review of Systems was done, except as stated above, all other Review of Systems were negative.  Social History Social History  Substance Use Topics  . Smoking status: Former Smoker -- 1.00 packs/day for 5 years    Types: Cigarettes  . Smokeless tobacco: Never Used     Comment: "quit smoking in the 1960's"  . Alcohol Use: No  Lives with wife and daughter. Denies etoh, tobacco or illegal drug use.   Family History Family History  Problem Relation Age of Onset  . Heart disease Mother   . Diabetes Mother     Prior to Admission medications   Medication Sig Start Date End Date Taking? Authorizing Provider  amLODipine (NORVASC) 10 MG tablet Take 1 tablet (10 mg total) by mouth daily. 08/08/14  Yes Theodis Blaze, MD  aspirin EC 81 MG tablet Take 81 mg by  mouth daily.   Yes Historical Provider, MD  cloNIDine (CATAPRES) 0.1 MG tablet Take 1 tablet (0.1 mg total) by mouth 3 (three) times daily. 08/08/14  Yes Theodis Blaze, MD  glipiZIDE (GLUCOTROL) 10 MG tablet Take 10 mg by mouth daily. 02/15/15  Yes Historical Provider, MD  hydrALAZINE (APRESOLINE) 25 MG tablet Take 1 tablet (25 mg total) by mouth every 8 (eight) hours. 08/08/14  Yes Theodis Blaze, MD  isosorbide mononitrate (IMDUR) 60 MG 24 hr tablet Take 60 mg by mouth every morning. 02/15/15  Yes Historical Provider, MD  torsemide (DEMADEX) 20 MG tablet Take 1 tablet (20 mg total) by mouth 2 (two) times daily. 08/08/14  Yes Theodis Blaze, MD  carvedilol (COREG) 6.25 MG tablet Take 1 tablet (6.25 mg total) by mouth 2 (two) times daily with a meal. Patient not taking: Reported on 03/10/2015 08/08/14   Theodis Blaze, MD    No Known Allergies  Physical Exam  Vitals  Blood pressure 146/81, pulse 95, temperature 97.9 F (36.6 C), temperature source Oral, resp. rate 16, height 5\' 6"  (1.676 m), weight 89.812 kg (198 lb), SpO2 97 %.   General:  lying in bed in NAD,   Psych:  Normal affect and insight, Not Suicidal or Homicidal, Awake Alert, Oriented X 3.  Neuro:   No F.N deficits, ALL C.Nerves Intact, Strength 5/5 all 4 extremities, Sensation intact all 4 extremities.  ENT:  Ears and Eyes appear Normal, Conjunctivae clear, PER. Moist oral mucosa without erythema or exudates.  Neck:  Supple, No lymphadenopathy appreciated  Respiratory:  Symmetrical chest wall movement, decreased movement with scattered crackles throughout. No wheezes or rales. No increased wob.   Cardiac:  RRR, No Murmurs, Pitting edema bilateral lower extremities up to knee. +JVD  Abdomen:  Positive bowel sounds, Soft, Non tender, Non distended,  No masses appreciated  Skin:  No Cyanosis, Normal Skin Turgor, No Skin Rash or Bruise.  Extremities:  Able to move all 4. 5/5 strength in each,  no effusions.  Data  Review  CBC  Recent Labs Lab 03/10/15 1238  WBC 8.2  HGB 7.3*  HCT 21.8*  PLT 189  MCV 86.2  MCH 28.9  MCHC 33.5  RDW 13.3    Chemistries   Recent Labs Lab 03/10/15 1238  NA 139  K 4.2  CL 108  CO2 17*  GLUCOSE 138*  BUN 83*  CREATININE 5.92*  CALCIUM 6.6*    estimated  creatinine clearance is 13.8 mL/min (by C-G formula based on Cr of 5.92).  No results for input(s): TSH, T4TOTAL, T3FREE, THYROIDAB in the last 72 hours.  Invalid input(s): FREET3  Coagulation profile No results for input(s): INR, PROTIME in the last 168 hours.  No results for input(s): DDIMER in the last 72 hours.  Cardiac Enzymes No results for input(s): CKMB, TROPONINI, MYOGLOBIN in the last 168 hours.  Invalid input(s): CK  Invalid input(s): POCBNP  Urinalysis    Component Value Date/Time   COLORURINE YELLOW 08/03/2014 1855   APPEARANCEUR CLEAR 08/03/2014 1855   LABSPEC 1.012 08/03/2014 1855   PHURINE 5.5 08/03/2014 1855   GLUCOSEU NEGATIVE 08/03/2014 1855   HGBUR LARGE* 08/03/2014 1855   BILIRUBINUR NEGATIVE 08/03/2014 1855   KETONESUR NEGATIVE 08/03/2014 1855   PROTEINUR >300* 08/03/2014 1855   UROBILINOGEN 1.0 08/03/2014 1855   NITRITE NEGATIVE 08/03/2014 1855   LEUKOCYTESUR NEGATIVE 08/03/2014 1855    Imaging results:   Dg Chest 2 View  03/10/2015  CLINICAL DATA:  Cough with shortness of breath for a few weeks. History of hypertension and diabetes. EXAM: CHEST  2 VIEW COMPARISON:  Radiographs 06/29/2014 and 08/03/2014. FINDINGS: Left subclavian AICD lead appears unchanged at the right ventricular apex. There is stable mild cardiac enlargement status post median sternotomy and CABG. Compared with the most recent study, the bilateral airspace opacities have partially cleared. There are residual opacities suspicious for edema. No significant residual pleural fluid. A small asymmetric nodular density inferiorly on the right on the frontal examination may reflect a nipple shadow.  The bones appear unchanged. IMPRESSION: Recurrent congestive heart failure with pulmonary edema, less advanced than on the most recent study. Possible nipple shadow projecting over the right lung base; attention on follow-up after the edema has resolved recommended. Electronically Signed   By: Richardean Sale M.D.   On: 03/10/2015 12:51    My personal review of EKG: NSR @ 95bpm, No ST changes noted when compared to 7/206 ekg   Assessment & Plan  Active Problems:   DM type 2 (diabetes mellitus, type 2) (HCC)   Hypertension   CKD (chronic kidney disease) stage 3, GFR 30-59 ml/min   AICD (automatic cardioverter/defibrillator) present   CHF exacerbation (HCC)  Acute on chronic mixed systolic/diastolic HF -BNP [id number] -Will admit for aggressive diuresis. May need to lower lasix dose in am. -repeat echo (last done 06/2014 showed EF 45/50%) -strict I/Os,daily weights (current weight 198lbs from 182 07/2014) -continue imdur, coreg. No ACEI/ARB secondary to renal failure  Acute on chronic renal failure/stage III CKD -baseline Cr ~3 up to 5.9 today -Monitor closely with diuresis -Nephrology to consult  Anemia, chronic dz -likely exacerbated by above -hemoccult negative -monitor need for transfusion DVT Prophylaxis:  Chest pain with hx CAD -cycle cardiac enzymes -ASA qd  DM2 -A1C 6.2% 06/2014 -Hold glucotrol while inpt, SSI  Hx hypertension -stable -continue home meds  Hx cardiac arrest s/p ICD placement  Hx Hep C cirrhosis Will check CMET  MGUS Follows outpt with [facility]   AM Labs Ordered, also please review Full Orders  Family Communication: None available on admission eval  Code Status:   Full code   Condition:   Guarded  Time spent in minutes : [place], NP on 03/10/2015 at 4:57 PM Between 7am to 7pm - Pager - [phone] After 7pm go to [email or web address] - password [email or web address]  And look for the night coverage person covering me after hours  [facility]

## 2015-03-10 NOTE — ED Notes (Signed)
Pt reports to the ED for eval of CP and SOB. The symptoms have been going on x several days but became worse today. Pt describes the pain as a heaviness. Pt also reports generalized pain. Pt also reports a dry cough. Pt has siognificant cardiac hx. Unable to speak in full sentences. Denies any N/V, lightheadedness, or dizziness. Pt A&OX4. Skin warm and dry.

## 2015-03-10 NOTE — ED Notes (Signed)
MD Einar Gip at bedside

## 2015-03-10 NOTE — Consult Note (Addendum)
CARDIOLOGY CONSULT NOTE  Patient ID: Eric [name] MRN: [id number] DOB/AGE: 28-Mar-1955 60 y.o.  Admit date: 03/10/2015 Referring Physician  Hospitalist Primary Physician:  [name], NP Reason for Consultation  CHF  HPI: Eric [name]  is a 60 y.o. male  With h/o chronic diastolic heart failure, preserved ejection fraction by echocardiogram on 07/01/2014 revealing LVEF 45-50% By echocardiogram on 07/01/14. Moderate pulmonary hypertension. He also has essential hypertension and uncontrolled diabetes mellitus and stage IV chronic kidney disease. He has had history of AICD implantation with [employer] defibrillator when he presented with cardiac arrest on 02/24/2013. He has known coronary artery disease and has had CABG in 1999. He is coronary angiography on 02/24/2013 had revealed diffuse distal LAD disease, RCA stenosis not felt to be amenable for PCI due to diffuse disease, patent LIMA to LAD and SVG to OM1. He also has chronic hepatitis C and hepatic cirrhosis of the liver.   He presented to the emergency department complaining of worsening shortness of breath and dyspnea on exertion and orthopnea and worsening leg edema that started about 3-4 days ago.  He also has been having chest pain in the form of chest tightness ongoing for the past 2 days, continuous and no exacerbating or relieving factors. He also states that his urine output had decreased significantly over the past couple days. States that he's been taking all his medications as recommended and has been watching his salt intake.  Past Medical History  Diagnosis Date  . Hypertension   . Hyperlipemia   . Edema   . Cardiac arrest due to underlying cardiac condition ([place])   . Obesity   . Ventricular tachycardia ([place])   . AICD (automatic cardioverter/defibrillator) present   . CHF (congestive heart failure) ([place])   . Myocardial infarction ([place]) 03/2013  . Pneumonia 2016  . History of blood transfusion 1950's    "related  to pinal menigitis; HAD 16 OPERATIONS TOTAL"  . Scabies infestation 06/29/2014  . Shortness of breath dyspnea   . Type II diabetes mellitus ([place])     type 2  . CKD (chronic kidney disease), stage III      Past Surgical History  Procedure Laterality Date  . Implantable cardioverter defibrillator implant  03/24/2013    STJ single chamber ICD implanted by Dr Caryl Comes for secondary prevention  . Left heart catheterization with coronary/graft angiogram  03/07/2013    Procedure: LEFT HEART CATHETERIZATION WITH [name];  Surgeon: Clent Demark, MD;  Location: [facility] CATH LAB;  Service: Cardiovascular;;  . Implantable cardioverter defibrillator implant N/A 03/24/2013    Procedure: IMPLANTABLE CARDIOVERTER DEFIBRILLATOR IMPLANT;  Surgeon: Deboraha Sprang, MD;  Location: [facility] CATH LAB;  Service: Cardiovascular;  Laterality: N/A;  . Cardiac catheterization    . Head surgery  1950'S    "for spinal menigitis; HAD 16 OPERATIONS TOTAL"  . Back surgery  1950's    "for spinal menigitis; HAD 16 OPERATIONS TOTAL"  . 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 Coronary artery bypass graft  1999    cabg x4     Family History  Problem Relation Age of Onset  . Heart disease Mother   . Diabetes Mother      Social History: Social History   Social History  . Marital Status: Married    Spouse Name: N/A  . Number of Children: N/A  . Years of Education: N/A   Occupational History  . Not on file.  Social History Main Topics  . Smoking status: Former Smoker -- 1.00 packs/day for 5 years    Types: Cigarettes  . Smokeless tobacco: Never Used     Comment: "quit smoking in the 1960's"  . Alcohol Use: No  . Drug Use: No  . Sexual Activity: Not Currently   Other Topics Concern  . Not on file   Social History Narrative      (Not in a hospital admission)   ROS: General: no fevers/chills/night sweats Eyes: no blurry vision, diplopia, or amaurosis ENT:  no sore throat or hearing loss Resp: no cough, wheezing, or hemoptysis CV: no edema or palpitations GI: no abdominal pain, nausea, vomiting, diarrhea, or constipation GU: no dysuria, frequency, or hematuria Skin: no rash Neuro: no headache, numbness, tingling, or weakness of extremities Musculoskeletal: no joint pain or swelling Heme: no bleeding, DVT, or easy bruising Endo: no polydipsia or polyuria    Physical Exam: Blood pressure 139/75, pulse 85, temperature 97.9 F (36.6 C), temperature source Oral, resp. rate 29, height 5\' 6"  (1.676 m), weight 89.812 kg (198 lb), SpO2 95 %.   Blood pressure 163/87, pulse 87, temperature 97.9 F (36.6 C), temperature source Oral, resp. rate 23, height 5\' 6"  (1.676 m), weight 89.812 kg (198 lb), SpO2 99 %.   General appearance: alert, cooperative, appears older than stated age, no distress and  flat lesion(pigmented lesions diffuse). Size - Description of size - small. Description - Distribution/Arrangement - diffuse and generalized. noted on the skin and old. Lungs: rhonchi bibasilar Heart: regular rate and rhythm, S1, S2 normal, no murmur, click, rub or gallop JVD noted up to the angle of the jaw. Abdomen: Distended, hepatomegaly present, hepatojugular reflux present, probable ascites present. Extremities: edema 3 plus bilateral and pitting Pulses: Femoral pulses 1-2 plus,, right much weaker than left,  popliteal pulses difficult to feel, pedal pulses faint  bilaterally. Neurologic: Grossly normal  Labs:   Lab Results  Component Value Date   WBC 8.2 03/10/2015   HGB 7.3* 03/10/2015   HCT 21.8* 03/10/2015   MCV 86.2 03/10/2015   PLT 189 03/10/2015    Recent Labs Lab 03/10/15 1238  NA 139  K 4.2  CL 108  CO2 17*  BUN 83*  CREATININE 5.92*  CALCIUM 6.6*  GLUCOSE 138*    Lipid Panel     Component Value Date/Time   TRIG 191* 03/10/2013 1130    BNP (last 3 results)  Recent Labs  06/29/14 1414 08/03/14 0012 03/10/15 1444   BNP 199.9* 952.6* 1086.9*    HEMOGLOBIN A1C Lab Results  Component Value Date   HGBA1C 6.2* 07/01/2014   MPG 131 07/01/2014    Cardiac Panel (last 3 results)  Recent Labs  06/29/14 2020  08/07/14 1713 08/07/14 2151 08/08/14 0325  CKTOTAL 294  --   --   --   --   TROPONINI  --   < > <0.03 <0.03 <0.03  < > = values in this interval not displayed.  Lab Results  Component Value Date   CKTOTAL 294 06/29/2014   CKMB 4.0 02/23/2014   TROPONINI <0.03 08/08/2014     TSH  Recent Labs  06/29/14 2020 08/03/14 0843  TSH 2.800 2.421    EKG:  Sinus rhythm at a rate of 74bpm, normal axis, left atrial abnormality, PRWP, nonspecific T-wave abnormality in lateral leads, cannot exclude anterolateral infarct, old. No significant change from   Echo 07/01/2014: Left ventricle: The cavity size was normal. There was mild concentric  hypertrophy. Systolic function was mildly reduced. The estimated ejection fraction was in the range of 45% to 50%. Doppler parameters are consistent with abnormal left ventricular relaxation (grade 1 diastolic dysfunction). Atrial septum: No defect or patent foramen ovale was identified. Pulmonary arteries: Systolic pressure was moderately increased. PA peak pressure: 42 mm Hg (S).    Radiology: Dg Chest 2 View  03/10/2015  CLINICAL DATA:  Cough with shortness of breath for a few weeks. History of hypertension and diabetes. EXAM: CHEST  2 VIEW COMPARISON:  Radiographs 06/29/2014 and 08/03/2014. FINDINGS: Left subclavian AICD lead appears unchanged at the right ventricular apex. There is stable mild cardiac enlargement status post median sternotomy and CABG. Compared with the most recent study, the bilateral airspace opacities have partially cleared. There are residual opacities suspicious for edema. No significant residual pleural fluid. A small asymmetric nodular density inferiorly on the right on the frontal examination may reflect a nipple shadow. The bones appear  unchanged. IMPRESSION: Recurrent congestive heart failure with pulmonary edema, less advanced than on the most recent study. Possible nipple shadow projecting over the right lung base; attention on follow-up after the edema has resolved recommended. Electronically Signed   By: Richardean Sale M.D.   On: 03/10/2015 12:51    ASSESSMENT AND PLAN:  Atherosclerosis of native coronary artery of native heart without angina pectoris  CABG 1999: Coronary angio 02/24/2013: Diffuse distal LAD disease, RCA stenosis not felt to be amenable for PCI, patent LIMA to LAD and SVG to OM1.  2. Acute on Chronic diastolic heart failure,  BNP also elevated today and this is probably chronic and baseline.   3. Benign essential hypertension  4. Controlled type 2 diabetes mellitus with complication, without long-term current use of insulin  5. Acute on CKD (chronic kidney disease), stage 4 to 5 (severe)   6. AICD (automatic cardioverter/defibrillator) present  [facility] Defibrillator [phone]. Physician: Dr [name].  Recommendation: Extremely difficult patient with history of noncompliance in the past, I had been seeing him fairly frequently in the office, states that he has been taking his medications well. I cannot exclude acute renal failure stage V and may need dialysis as an etiology for his presentation, also he may have worsening liver dysfunction with ascites. I have recommended high-dose of furosemide 1 now and we will follow-up on his urine output. His chest pain is clearly musculoskeletal, also BNP is essentially at his baseline but clinically he is clearly in congestive heart failure and fluid overload state. We will continue to follow.Reviewed medication orders and agree. Not released yet on EPIC. Adrian Prows, MD 03/10/2015, 6:07 PM [facility] [place] Pager: [phone] Office: [phone] If no answer Cell [phone]

## 2015-03-10 NOTE — ED Provider Notes (Signed)
CSN: [id number]     Arrival date & time 03/10/15  1157 History   First MD Initiated Contact with Patient 03/10/15 1418     Chief Complaint  Patient presents with  . Chest Pain  . Shortness of Breath     (Consider location/radiation/quality/duration/timing/severity/associated sxs/prior Treatment) HPI Comments: Patient with a history of Cardiac Arrest s/p AICD, CHF, DM, and CKD presents today with complaints of chest pain and SOB.  He reports onset of pain at 7:00 PM last evening.  He states that the pain has been constant since that time.  Pain is located substernal and he reports that it radiates to his back and both arms.  He describes the pain as a pressure.  He reports that he has also had orthopnea and SOB, which has been worse over the past couple of days.  He has had to sleep in a recliner the past few nights.  He reports that his bilateral LE edema was been worsening over the past few weeks.  He is currently on Torsemide 20 mg BID, which he reports that he is taking.  He also reports decreased urinary output over the past couple of days.  He denies numbness, tingling, cough, fever, chills, hemoptysis, nausea, vomiting, dizziness, or syncope.  His Cardiologist is Dr. Einar Gip.    The history is provided by the patient.    Past Medical History  Diagnosis Date  . Hypertension   . Hyperlipemia   . Edema   . 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 Obesity   . Ventricular tachycardia ([place])   . AICD (automatic cardioverter/defibrillator) present   . CHF (congestive heart failure) ([place])   . Myocardial infarction ([place]) 03/2013  . Pneumonia 2016  . History of blood transfusion 1950's    "related to pinal menigitis; HAD 16 OPERATIONS TOTAL"  . Scabies infestation 06/29/2014  . Shortness of breath dyspnea   . Type II diabetes mellitus ([place])     type 2  . CKD (chronic kidney disease), stage III    Past Surgical History  Procedure Laterality Date  . Implantable  cardioverter defibrillator implant  03/24/2013    STJ single chamber ICD implanted by Dr Caryl Comes for secondary prevention  . Left heart catheterization with coronary/graft angiogram  03/07/2013    Procedure: LEFT HEART CATHETERIZATION WITH [name];  Surgeon: Clent Demark, MD;  Location: [facility] CATH LAB;  Service: Cardiovascular;;  . Implantable cardioverter defibrillator implant N/A 03/24/2013    Procedure: IMPLANTABLE CARDIOVERTER DEFIBRILLATOR IMPLANT;  Surgeon: Deboraha Sprang, MD;  Location: [facility] CATH LAB;  Service: Cardiovascular;  Laterality: N/A;  . Cardiac catheterization    . Head surgery  1950'S    "for spinal menigitis; HAD 16 OPERATIONS TOTAL"  . Back surgery  1950's    "for spinal menigitis; HAD 16 OPERATIONS TOTAL"  . 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 Coronary artery bypass graft  1999    cabg x4   Family History  Problem Relation Age of Onset  . Heart disease Mother   . Diabetes Mother    Social History  Substance Use Topics  . Smoking status: Former Smoker -- 1.00 packs/day for 5 years    Types: Cigarettes  . Smokeless tobacco: Never Used     Comment: "quit smoking in the 1960's"  . Alcohol Use: No    Review of Systems  All other systems reviewed and are negative.  Allergies  Review of patient's allergies indicates no known allergies.  Home Medications   Prior to Admission medications   Medication Sig Start Date End Date Taking? Authorizing Provider  amLODipine (NORVASC) 10 MG tablet Take 1 tablet (10 mg total) by mouth daily. 08/08/14   Theodis Blaze, MD  aspirin EC 81 MG tablet Take 81 mg by mouth daily.    Historical Provider, MD  carvedilol (COREG) 6.25 MG tablet Take 1 tablet (6.25 mg total) by mouth 2 (two) times daily with a meal. 08/08/14   Theodis Blaze, MD  cloNIDine (CATAPRES) 0.1 MG tablet Take 1 tablet (0.1 mg total) by mouth 3 (three) times daily. 08/08/14   Theodis Blaze, MD  glipiZIDE  (GLUCOTROL) 5 MG tablet Take 1 tablet (5 mg total) by mouth daily before breakfast. 08/08/14   Theodis Blaze, MD  hydrALAZINE (APRESOLINE) 25 MG tablet Take 1 tablet (25 mg total) by mouth every 8 (eight) hours. 08/08/14   Theodis Blaze, MD  isosorbide mononitrate (IMDUR) 30 MG 24 hr tablet Take 1 tablet (30 mg total) by mouth daily. 08/08/14   Theodis Blaze, MD  torsemide (DEMADEX) 20 MG tablet Take 1 tablet (20 mg total) by mouth 2 (two) times daily. 08/08/14   Theodis Blaze, MD   BP 146/81 mmHg  Pulse 95  Temp(Src) 97.9 F (36.6 C) (Oral)  Resp 16  Ht 5\' 6"  (1.676 m)  Wt 89.812 kg  BMI 31.97 kg/m2  SpO2 97% Physical Exam  Constitutional: He appears well-developed and well-nourished. No distress.  HENT:  Head: Normocephalic and atraumatic.  Mouth/Throat: Oropharynx is clear and moist.  Neck: Normal range of motion. Neck supple.  Cardiovascular: Normal rate, regular rhythm and normal heart sounds.   Pulmonary/Chest: Effort normal and breath sounds normal.  Musculoskeletal: Normal range of motion.  3+ pitting edema of lower extremities from knee distally  Neurological: He is alert.  Skin: Skin is warm and dry. He is not diaphoretic.  Psychiatric: He has a normal mood and affect.  Nursing note and vitals reviewed.   ED Course  Procedures (including critical care time) Labs Review Labs Reviewed  BASIC METABOLIC PANEL - Abnormal; Notable for the following:    CO2 17 (*)    Glucose, Bld 138 (*)    BUN 83 (*)    Creatinine, Ser 5.92 (*)    Calcium 6.6 (*)    GFR calc non Af Amer 9 (*)    GFR calc Af Amer 11 (*)    All other components within normal limits  CBC - Abnormal; Notable for the following:    RBC 2.53 (*)    Hemoglobin 7.3 (*)    HCT 21.8 (*)    All other components within normal limits  I-STAT TROPOININ, ED    Imaging Review Dg Chest 2 View  03/10/2015  CLINICAL DATA:  Cough with shortness of breath for a few weeks. History of hypertension and diabetes. EXAM:  CHEST  2 VIEW COMPARISON:  Radiographs 06/29/2014 and 08/03/2014. FINDINGS: Left subclavian AICD lead appears unchanged at the right ventricular apex. There is stable mild cardiac enlargement status post median sternotomy and CABG. Compared with the most recent study, the bilateral airspace opacities have partially cleared. There are residual opacities suspicious for edema. No significant residual pleural fluid. A small asymmetric nodular density inferiorly on the right on the frontal examination may reflect a nipple shadow. The bones appear unchanged. IMPRESSION: Recurrent congestive heart failure with pulmonary edema,  less advanced than on the most recent study. Possible nipple shadow projecting over the right lung base; attention on follow-up after the edema has resolved recommended. Electronically Signed   By: Richardean Sale M.D.   On: 03/10/2015 12:51   I have personally reviewed and evaluated these images and lab results as part of my medical decision-making.   EKG Interpretation   Date/Time:  Wednesday March 10 2015 12:15:10 EST Ventricular Rate:  95 PR Interval:  136 QRS Duration: 72 QT Interval:  384 QTC Calculation: 482 R Axis:   93 Text Interpretation:  Normal sinus rhythm Rightward axis Anterior infarct  , age undetermined Abnormal ECG ED PHYSICIAN INTERPRETATION AVAILABLE IN  [facility] Confirmed by TEST, Record ([id number]) on 03/11/2015 6:33:24 AM     3:51 PM Discussed with Dr. Einar Gip with Cardiology.  He recommends Medicine admission and states that he will see the patient in consult. MDM   Final diagnoses:  None  Patient with a history of Cardiac Arrest s/p AICD, CHF, CKD, and DM presents today with chest pain, SOB, orthopnea, and worsening bilateral LE edema.  On exam, patient with significant 3+ bilateral pitting edema.  CXR showing pulmonary edema.  BNP also elevated at 1086.  No ischemic changes on EKG.  Initial Troponin is negative.  Presentation most consistent with  CHF exacerbation.  Patient given 40 mg IV Lasix in the ED.  However, patient's Creatine has worsened to 5.92, which is increased from a baseline of 3-4.  He also reports decreased urinary output.  Dr. Einar Gip with Cardiology consulted and recommended admission to Medicine for possible dialysis.  Patient discussed with [facility] who has agreed to admit the patient.      Hyman Bible, PA-C 03/13/15 1003  Veryl Speak, MD 03/14/15 [phone]

## 2015-03-11 ENCOUNTER — Other Ambulatory Visit: Payer: Self-pay

## 2015-03-11 ENCOUNTER — Inpatient Hospital Stay (HOSPITAL_COMMUNITY): Payer: Medicaid Other

## 2015-03-11 DIAGNOSIS — E1122 Type 2 diabetes mellitus with diabetic chronic kidney disease: Secondary | ICD-10-CM

## 2015-03-11 DIAGNOSIS — Z95810 Presence of automatic (implantable) cardiac defibrillator: Secondary | ICD-10-CM

## 2015-03-11 DIAGNOSIS — I5043 Acute on chronic combined systolic (congestive) and diastolic (congestive) heart failure: Secondary | ICD-10-CM

## 2015-03-11 LAB — VITAMIN B12: Vitamin B-12: 603 pg/mL (ref 180–914)

## 2015-03-11 LAB — IRON AND TIBC
Iron: 41 ug/dL — ABNORMAL LOW (ref 45–182)
Saturation Ratios: 22 % (ref 17.9–39.5)
TIBC: 188 ug/dL — ABNORMAL LOW (ref 250–450)
UIBC: 147 ug/dL

## 2015-03-11 LAB — CBC
HEMATOCRIT: 20 % — AB (ref 39.0–52.0)
HEMOGLOBIN: 7 g/dL — AB (ref 13.0–17.0)
MCH: 30.4 pg (ref 26.0–34.0)
MCHC: 35 g/dL (ref 30.0–36.0)
MCV: 87 fL (ref 78.0–100.0)
Platelets: 164 10*3/uL (ref 150–400)
RBC: 2.3 MIL/uL — ABNORMAL LOW (ref 4.22–5.81)
RDW: 13.5 % (ref 11.5–15.5)
WBC: 7 10*3/uL (ref 4.0–10.5)

## 2015-03-11 LAB — FOLATE: Folate: 16.3 ng/mL (ref >5.9)

## 2015-03-11 LAB — BASIC METABOLIC PANEL
Anion gap: 13 (ref 5–15)
BUN: 81 mg/dL — AB (ref 6–20)
CHLORIDE: 107 mmol/L (ref 101–111)
CO2: 16 mmol/L — AB (ref 22–32)
CREATININE: 6.05 mg/dL — AB (ref 0.61–1.24)
Calcium: 6.4 mg/dL — CL (ref 8.9–10.3)
GFR calc Af Amer: 10 mL/min — ABNORMAL LOW (ref >60)
GFR calc non Af Amer: 9 mL/min — ABNORMAL LOW (ref >60)
Glucose, Bld: 132 mg/dL — ABNORMAL HIGH (ref 65–99)
Potassium: 4.2 mmol/L (ref 3.5–5.1)
Sodium: 136 mmol/L (ref 135–145)

## 2015-03-11 LAB — GLUCOSE, CAPILLARY
GLUCOSE-CAPILLARY: 53 mg/dL — AB (ref 65–99)
GLUCOSE-CAPILLARY: 98 mg/dL (ref 65–99)
Glucose-Capillary: 88 mg/dL (ref 65–99)
Glucose-Capillary: 124 mg/dL — ABNORMAL HIGH (ref 65–99)
Glucose-Capillary: 98 mg/dL (ref 65–99)

## 2015-03-11 LAB — RETICULOCYTES
RBC.: 2.26 MIL/uL — AB (ref 4.22–5.81)
RETIC COUNT ABSOLUTE: 36.2 10*3/uL (ref 19.0–186.0)
RETIC CT PCT: 1.6 % (ref 0.4–3.1)

## 2015-03-11 LAB — FERRITIN: FERRITIN: 122 ng/mL (ref 24–336)

## 2015-03-11 LAB — TROPONIN I
Troponin I: 0.06 ng/mL — ABNORMAL HIGH (ref <0.031)
Troponin I: 0.05 ng/mL — ABNORMAL HIGH (ref <0.031)
Troponin I: 0.04 ng/mL — ABNORMAL HIGH (ref <0.031)

## 2015-03-11 MED ORDER — CALCIUM GLUCONATE 10 % IV SOLN
1.0000 g | Freq: Once | INTRAVENOUS | Status: AC
  Administered 2015-03-11: 1 g via INTRAVENOUS
  Filled 2015-03-11: qty 10

## 2015-03-11 MED ORDER — DARBEPOETIN ALFA 150 MCG/0.3ML IJ SOSY
150.0000 ug | PREFILLED_SYRINGE | INTRAMUSCULAR | Status: DC
  Administered 2015-03-12 – 2015-03-19 (×2): 150 ug via SUBCUTANEOUS
  Filled 2015-03-11 (×3): qty 0.3

## 2015-03-11 MED ORDER — PERFLUTREN LIPID MICROSPHERE
INTRAVENOUS | Status: AC
  Filled 2015-03-11: qty 10

## 2015-03-11 MED ORDER — PERFLUTREN LIPID MICROSPHERE
1.0000 mL | INTRAVENOUS | Status: AC | PRN
  Administered 2015-03-11: 2 mL via INTRAVENOUS
  Filled 2015-03-11: qty 10

## 2015-03-11 MED ORDER — NITROGLYCERIN 0.4 MG SL SUBL
0.4000 mg | SUBLINGUAL_TABLET | SUBLINGUAL | Status: DC | PRN
  Administered 2015-03-11: 0.4 mg via SUBLINGUAL
  Filled 2015-03-11: qty 1

## 2015-03-11 NOTE — Progress Notes (Addendum)
   03/11/15 2021  Vitals  Temp 97.4 F (36.3 C)  Temp Source Oral  BP 116/76 mmHg  BP Location Right Arm  BP Method Automatic  Patient Position (if appropriate) Sitting  Pulse Rate 75  Pulse Rate Source Dinamap  Oxygen Therapy  SpO2 100 %  O2 Device Room Air  Pain Assessment  Pain Assessment 0-10  Pain Score 9  Pain Type Acute pain  Pain Location Chest  Pain Radiating Towards Lt leg  Pain Descriptors / Indicators Pressure  Pain Intervention(s) MD notified (Comment) (L [place] and Cardiology Fellow)  Pt c/o 9/10 chest pressure and SOB. EKG completed. 2LNC applied. Pt currently rates pain 3/10. Will complete 2nd EKG and have troponin drawn as ordered. Cardiology Fellow Josephina Shih notified. Spoke with Dr. Einar Gip and telephone order for nitroglycerin received. Patient's pain was resolved with one ntroglycerin.

## 2015-03-11 NOTE — ED Notes (Signed)
Report given to [facility], Barnetta Chapel, to notify of Calcium 6.4, and that patient has been transported to [facility]. MD aware.

## 2015-03-11 NOTE — Progress Notes (Signed)
[facility]                                                                              Patient Demographics  Eric [name], is a 60 y.o. male, DOB - Oct 04, 1955, [id number]  Admit date - 03/10/2015   Admitting Physician Waldemar Dickens, MD  Outpatient Primary MD for the patient is [name], [name], NP  LOS - 1   Chief Complaint  Patient presents with  . Chest Pain  . Shortness of Breath       Brief HPI   Eric [name] is a 60 y.o. 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 Pt presents today with complaints of chest pain and shortness of breath for several days becoming increasingly worse prompting visit to ED.   Pt states he is compliant with home meds. He denied recent fever, dizziness or palpitations, abdominal pain, n/v/d. ED evaluation consistent with acute HF exacerbation, creatinine up to 5.9 from baseline around 3, Hgb 7.3. Pt was admitted for further evaluation and treatment.   Assessment & Plan    Acute on chronic mixed systolic/diastolic CHF: -BNP 7619, 2-D echo 6/16 had shown EF of 45-50%, moderate pulmonary hypertension with underlying history of chronic kidney disease, now stage IV-V - Cardiology consulted, appreciate recommendations - Creatinine 6.05, discussed in detail with Dr. Einar Gip, will also consult nephrology, patient may need aggressive diuresis and possible hemodialysis. Discussed with Dr. Justin Mend - No chest pain, troponins slightly elevated, likely due to acute on chronic CHF, CKD, follow 2-D echo - Continue strict I's and O's, still positive volume overload 5 53 mL - continue imdur, coreg.  - No ACEI/ARB secondary to renal failure  Acute on chronic renal failure/stage III CKD: now stage IV-V: Likely due to long-standing history of uncontrolled diabetes, hypertensive  nephropathy -baseline Cr ~3,  up to 5.9 on admission, was sent to 6.0, - Monitor closely with diuresis, currently on Lasix 80 mg IV q12hrs - Discussed with nephrology, Dr. Justin Mend, patient may likely need hemodialysis soon for volume control and worsening renal function.  Anemia: Normochromic normocytic - Hemoccult negative - Hemoglobin down to 7.0, will check anemia panel  Chest pain with hx CAD -No chest pain currently, troponins slightly positive due to #1 and 2  -ASA qd , follow 2-D echo  DM2 -A1C 6.2% 06/2014 -Continue sliding scale insulin, follow hemoglobin A1c  Hx hypertension -stable -continue home meds  Hx cardiac arrest s/p ICD placement  Hx Hep C cirrhosis LFTs normal  MGUS Follows outpt with [facility], will obtain SPEP, UPEP, rule out multiple myeloma given worsening renal function, anemia  Code Status:FULL CODE  Family Communication: Discussed in detail with the patient, all imaging results, lab results explained to the patient    Disposition Plan:  Time Spent in minutes 25 minutes  Procedures  2-D echo  Consults   Cardiology Nephrology  DVT Prophylaxis  heparin   Medications  Scheduled Meds: . aspirin EC  81 mg Oral Daily  . carvedilol  6.25 mg  Oral BID WC  . cloNIDine  0.1 mg Oral TID  . furosemide  80 mg Intravenous BID  . heparin  5,000 Units Subcutaneous 3 times per day  . hydrALAZINE  25 mg Oral 3 times per day  . insulin aspart  0-9 Units Subcutaneous TID WC  . isosorbide mononitrate  60 mg Oral q morning - 10a  . perflutren lipid microspheres (DEFINITY) IV suspension      . sodium chloride flush  3 mL Intravenous Q12H   Continuous Infusions:  PRN Meds:.acetaminophen, ondansetron (ZOFRAN) IV, sodium chloride flush   Antibiotics   Anti-infectives    None        Subjective:   Eric [name] was seen and examined today. Complaining of lower extremity swelling. No chest pain at the time of my examination, no change in shortness of  breath. Patient denies dizziness,  abdominal pain, N/V/D/C, new weakness, numbess, tingling. No acute events overnight.    Objective:   Filed Vitals:   03/11/15 0430 03/11/15 0505 03/11/15 0508 03/11/15 1138  BP: 160/95  172/95 119/66  Pulse: 82  83 83  Temp:   97.7 F (36.5 C) 97.9 F (36.6 C)  TempSrc:   Oral Oral  Resp: '18  18 18  '$ Height:  '5\' 6"'$  (1.676 m)    Weight:  90.538 kg (199 lb 9.6 oz)    SpO2: 96%  99% 99%    Intake/Output Summary (Last 24 hours) at 03/11/15 1438 Last data filed at 03/11/15 1431  Gross per 24 hour  Intake    830 ml  Output    277 ml  Net    553 ml     Wt Readings from Last 3 Encounters:  03/11/15 90.538 kg (199 lb 9.6 oz)  08/08/14 82.827 kg (182 lb 9.6 oz)  07/24/14 87.091 kg (192 lb)     Exam  General: Alert and oriented x 3, NAD  HEENT:  PERRLA, EOMI, Anicteric Sclera, mucous membranes moist.   Neck: Supple, + JVD, no masses  CVS: S1 S2 auscultated, no rubs, murmurs or gallops. Regular rate and rhythm.  Respiratory: Bibasilar crackles  Abdomen: Soft, nontender, distended, + bowel sounds  Ext: no cyanosis clubbing, 2+ edema  Neuro: AAOx3, Cr N's II- XII. Strength 5/5 upper and lower extremities bilaterally  Skin: No rashes  Psych: Normal affect and demeanor, alert and oriented x3    Data Review   Micro Results No results found for this or any previous visit (from the past 240 hour(s)).  Radiology Reports Dg Chest 2 View  03/10/2015  CLINICAL DATA:  Cough with shortness of breath for a few weeks. History of hypertension and diabetes. EXAM: CHEST  2 VIEW COMPARISON:  Radiographs 06/29/2014 and 08/03/2014. FINDINGS: Left subclavian AICD lead appears unchanged at the right ventricular apex. There is stable mild cardiac enlargement status post median sternotomy and CABG. Compared with the most recent study, the bilateral airspace opacities have partially cleared. There are residual opacities suspicious for edema. No significant  residual pleural fluid. A small asymmetric nodular density inferiorly on the right on the frontal examination may reflect a nipple shadow. The bones appear unchanged. IMPRESSION: Recurrent congestive heart failure with pulmonary edema, less advanced than on the most recent study. Possible nipple shadow projecting over the right lung base; attention on follow-up after the edema has resolved recommended. Electronically Signed   By: Richardean Sale M.D.   On: 03/10/2015 12:51    CBC  Recent Labs Lab 03/10/15 1238 03/10/15  2138 03/11/15 0127  WBC 8.2 7.8 7.0  HGB 7.3* 7.2* 7.0*  HCT 21.8* 21.9* 20.0*  PLT 189 176 164  MCV 86.2 86.9 87.0  MCH 28.9 28.6 30.4  MCHC 33.5 32.9 35.0  RDW 13.3 13.3 13.5    Chemistries   Recent Labs Lab 03/10/15 1238 03/10/15 2138 03/11/15 0127  NA 139  --  136  K 4.2  --  4.2  CL 108  --  107  CO2 17*  --  16*  GLUCOSE 138*  --  132*  BUN 83*  --  81*  CREATININE 5.92* 6.13* 6.05*  CALCIUM 6.6*  --  6.4*  AST  --  24  --   ALT  --  35  --   ALKPHOS  --  61  --   BILITOT  --  0.4  --    ------------------------------------------------------------------------------------------------------------------ estimated creatinine clearance is 13.5 mL/min (by C-G formula based on Cr of 6.05). ------------------------------------------------------------------------------------------------------------------ No results for input(s): HGBA1C in the last 72 hours. ------------------------------------------------------------------------------------------------------------------ No results for input(s): CHOL, HDL, LDLCALC, TRIG, CHOLHDL, LDLDIRECT in the last 72 hours. ------------------------------------------------------------------------------------------------------------------ No results for input(s): TSH, T4TOTAL, T3FREE, THYROIDAB in the last 72 hours.  Invalid input(s):  FREET3 ------------------------------------------------------------------------------------------------------------------ No results for input(s): VITAMINB12, FOLATE, FERRITIN, TIBC, IRON, RETICCTPCT in the last 72 hours.  Coagulation profile No results for input(s): INR, PROTIME in the last 168 hours.  No results for input(s): DDIMER in the last 72 hours.  Cardiac Enzymes  Recent Labs Lab 03/10/15 2138 03/11/15 0125 03/11/15 0736  TROPONINI 0.06* 0.06* 0.05*   ------------------------------------------------------------------------------------------------------------------ Invalid input(s): POCBNP   Recent Labs  03/11/15 0643 03/11/15 0901 03/11/15 1056  GLUCAP 36* 98 44     RAI,RIPUDEEP M.D. [facility] 03/11/2015, 2:38 PM  Pager: [phone] Between 7am to 7pm - call Pager - [phone]  After 7pm go to [email or web address] - password [email or web address]  Call night coverage person covering after 7pm

## 2015-03-11 NOTE — Progress Notes (Signed)
Subjective:  No change in symptoms. No further chest pain. States he is not urinating much.   Objective:  Vital Signs in the last 24 hours: Temp:  [97.7 F (36.5 C)-97.9 F (36.6 C)] 97.9 F (36.6 C) (02/16 1138) Pulse Rate:  [75-90] 83 (02/16 1138) Resp:  [17-29] 18 (02/16 1138) BP: (119-182)/(66-103) 119/66 mmHg (02/16 1138) SpO2:  [93 %-99 %] 99 % (02/16 1138) Weight:  [90.538 kg (199 lb 9.6 oz)] 90.538 kg (199 lb 9.6 oz) (02/16 0505)   Blood pressure 119/66, pulse 83, temperature 97.9 F (36.6 C), temperature source Oral, resp. rate 18, height 5\' 6"  (1.676 m), weight 90.538 kg (199 lb 9.6 oz), SpO2 99 %.   Intake/Output from previous day: 02/15 0701 - 02/16 0700 In: 350 [P.O.:240; IV Piggyback:110] Out: 275 [Urine:275]  Physical Exam:  General appearance: alert, cooperative, appears older than stated age, no distress and  flat lesion(pigmented lesions diffuse). Size - Description of size - small. Description - Distribution/Arrangement - diffuse and generalized. noted on the skin and old. Lungs: rhonchi bibasilar Heart: regular rate and rhythm, S1, S2 normal, no murmur, click, rub or gallop JVD noted up to the angle of the jaw. Abdomen: Distended, hepatomegaly present, hepatojugular reflux present, probable ascites present. Extremities: edema 3 plus bilateral and pitting Pulses: Femoral pulses 1-2 plus,, right much weaker than left, popliteal pulses difficult to feel, pedal pulses faint bilaterally. Neurologic: Grossly normal.   Lab Results: BMP  Recent Labs  08/08/14 0325 03/10/15 1238 03/10/15 2138 03/11/15 0127  NA 132* 139  --  136  K 3.5 4.2  --  4.2  CL 98* 108  --  107  CO2 27 17*  --  16*  GLUCOSE 85 138*  --  132*  BUN 65* 83*  --  81*  CREATININE 3.84* 5.92* 6.13* 6.05*  CALCIUM 7.9* 6.6*  --  6.4*  GFRNONAA 16* 9* 9* 9*  GFRAA 18* 11* 10* 10*    CBC  Recent Labs Lab 03/11/15 0127 03/11/15 1534  WBC 7.0  --   RBC 2.30* 2.26*  HGB 7.0*  --    HCT 20.0*  --   PLT 164  --   MCV 87.0  --   MCH 30.4  --   MCHC 35.0  --   RDW 13.5  --     HEMOGLOBIN A1C Lab Results  Component Value Date   HGBA1C 6.2* 07/01/2014   MPG 131 07/01/2014    Cardiac Panel (last 3 results)  Recent Labs  06/29/14 2020  03/10/15 2138 03/11/15 0125 03/11/15 0736  CKTOTAL 294  --   --   --   --   TROPONINI  --   < > 0.06* 0.06* 0.05*  < > = values in this interval not displayed.   Recent Labs  06/29/14 2020 08/03/14 0843  TSH 2.800 2.421     Recent Labs  07/03/14 0359  07/24/14 0855 08/03/14 0843 03/10/15 2138  PROT 7.8  --  7.6 8.1 8.0  ALBUMIN 2.1*  < > 2.5* 2.1* 1.8*  AST 22  --  22 27 24   ALT 26  --  23 24 35  ALKPHOS 56  --  68 69 61  BILITOT 0.4  --  0.4 0.5 0.4  BILIDIR <0.1*  --   --   --  <0.1*  IBILI NOT CALCULATED  --   --   --  NOT CALCULATED  < > = values in this interval not displayed.  Cardiac Studies: EKG: Sinus rhythm at a rate of 74bpm, normal axis, left atrial abnormality, PRWP, nonspecific T-wave abnormality in lateral leads, cannot exclude anterolateral infarct, old. No significant change from   Echo 07/01/2014: Left ventricle: The cavity size was normal. There was mild concentric hypertrophy. Systolic function was mildly reduced. The estimated ejection fraction was in the range of 45% to 50%. Doppler parameters are consistent with abnormal left ventricular relaxation (grade 1 diastolic dysfunction). Atrial septum: No defect or patent foramen ovale was identified. Pulmonary arteries: Systolic pressure was moderately increased. PA peak pressure: 42 mm Hg (S).  Assessment/Plan:  1. Atherosclerosis of native coronary artery of native heart without angina pectoris  CABG 1999: Coronary angio 02/24/2013: Diffuse distal LAD disease, RCA stenosis not felt to be amenable for PCI, patent LIMA to LAD and SVG to OM1.  2. Acute on Chronic diastolic heart failure,  BNP also elevated today and this is probably chronic  and baseline.   3. Benign essential hypertension  4. Controlled type 2 diabetes mellitus with complication, without long-term current use of insulin  5. Acute on CKD (chronic kidney disease), stage 4 to 5 (severe)   6. AICD (automatic cardioverter/defibrillator) present  [facility] Defibrillator [phone]. Physician: Dr [name].  Rec: patient with no significant diuresis, suspect he will end up needing hemodialysis.  I discussed this with the patient, who is willing to proceed with the same if it is felt necessary.  Will await consultation from nephrology. Echo images not uploaded yet.  Eric [name], M.D. 03/11/2015, 5:03 PM [facility], [place] Pager: [phone] Office: [phone] If no answer: [phone]

## 2015-03-11 NOTE — Progress Notes (Signed)
  Echocardiogram 2D Echocardiogram with Definity has been performed.  Tresa Res 03/11/2015, 2:13 PM

## 2015-03-11 NOTE — ED Notes (Signed)
Attempted to call report to [place], RN on [place] she was just stepping out of a patient's room & would call right back. Left name and # for return call to give report.

## 2015-03-11 NOTE — Consult Note (Signed)
Referring Provider: No ref. provider found Primary Care Physician:  [name], NP Primary Nephrologist:    Reason for Consultation:   Renal Insufficiency, Anemia and pulmonary edema  HPI: Eric [name] is a 60 y.o. male With h/o chronic diastolic heart failure, preserved ejection fraction by echocardiogram on 07/01/2014 revealing LVEF 45-50% By echocardiogram on 07/01/14. Moderate pulmonary hypertension. He also has essential hypertension and uncontrolled diabetes mellitus and stage IV chronic kidney disease. He has had history of AICD implantation with [employer] defibrillator when he presented with cardiac arrest on 02/24/2013. He has known coronary artery disease and has had CABG in 1999. He is coronary angiography on 02/24/2013 had revealed diffuse distal LAD disease, RCA stenosis not felt to be amenable for PCI due to diffuse disease, patent LIMA to LAD and SVG to OM1. He also has chronic hepatitis C and hepatic cirrhosis of the liver.   He was admitted with worsening dyspnea.   His creatinine was elevated above 6 mg/dl  With poor response to diuretics , he has known chronic renal failure stage 4 with a creatinine of almost 4 in July 2016  Past Medical History  Diagnosis Date  . Hypertension   . Hyperlipemia   . Edema   . Cardiac arrest due to underlying cardiac condition ([place])   . Obesity   . Ventricular tachycardia ([place])   . AICD (automatic cardioverter/defibrillator) present   . CHF (congestive heart failure) ([place])   . Myocardial infarction ([place]) 03/2013  . Pneumonia 2016  . History of blood transfusion 1950's    "related to pinal menigitis; HAD 16 OPERATIONS TOTAL"  . Scabies infestation 06/29/2014  . Shortness of breath dyspnea   . Type II diabetes mellitus ([place])     type 2  . CKD (chronic kidney disease), stage III     Past Surgical History  Procedure Laterality Date  . Implantable cardioverter defibrillator implant  03/24/2013    STJ single chamber ICD implanted  by Dr Caryl Comes for secondary prevention  . Left heart catheterization with coronary/graft angiogram  03/07/2013    Procedure: LEFT HEART CATHETERIZATION WITH [name];  Surgeon: Clent Demark, MD;  Location: [facility] CATH LAB;  Service: Cardiovascular;;  . Implantable cardioverter defibrillator implant N/A 03/24/2013    Procedure: IMPLANTABLE CARDIOVERTER DEFIBRILLATOR IMPLANT;  Surgeon: Deboraha Sprang, MD;  Location: [facility] CATH LAB;  Service: Cardiovascular;  Laterality: N/A;  . Cardiac catheterization    . Head surgery  1950'S    "for spinal menigitis; HAD 16 OPERATIONS TOTAL"  . Back surgery  1950's    "for spinal menigitis; HAD 16 OPERATIONS TOTAL"  . 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 Coronary artery bypass graft  1999    cabg x4    Prior to Admission medications   Medication Sig Start Date End Date Taking? Authorizing Provider  amLODipine (NORVASC) 10 MG tablet Take 1 tablet (10 mg total) by mouth daily. 08/08/14  Yes Theodis Blaze, MD  aspirin EC 81 MG tablet Take 81 mg by mouth daily.   Yes Historical Provider, MD  cloNIDine (CATAPRES) 0.1 MG tablet Take 1 tablet (0.1 mg total) by mouth 3 (three) times daily. 08/08/14  Yes Theodis Blaze, MD  glipiZIDE (GLUCOTROL) 10 MG tablet Take 10 mg by mouth daily. 02/15/15  Yes Historical Provider, MD  hydrALAZINE (APRESOLINE) 25 MG tablet Take 1 tablet (25 mg total) by mouth every 8 (eight) hours. 08/08/14  Yes  Theodis Blaze, MD  isosorbide mononitrate (IMDUR) 60 MG 24 hr tablet Take 60 mg by mouth every morning. 02/15/15  Yes Historical Provider, MD  torsemide (DEMADEX) 20 MG tablet Take 1 tablet (20 mg total) by mouth 2 (two) times daily. 08/08/14  Yes Theodis Blaze, MD  carvedilol (COREG) 6.25 MG tablet Take 1 tablet (6.25 mg total) by mouth 2 (two) times daily with a meal. Patient not taking: Reported on 03/10/2015 08/08/14   Theodis Blaze, MD    Current Facility-Administered Medications   Medication Dose Route Frequency Provider Last Rate Last Dose  . acetaminophen (TYLENOL) tablet 650 mg  650 mg Oral Q4H PRN Dionne Milo, NP      . aspirin EC tablet 81 mg  81 mg Oral Daily Dionne Milo, NP   81 mg at 03/11/15 0906  . carvedilol (COREG) tablet 6.25 mg  6.25 mg Oral BID WC Dionne Milo, NP   6.25 mg at 03/11/15 1648  . cloNIDine (CATAPRES) tablet 0.1 mg  0.1 mg Oral TID Dionne Milo, NP   0.1 mg at 03/11/15 1648  . furosemide (LASIX) injection 80 mg  80 mg Intravenous BID Dionne Milo, NP   80 mg at 03/11/15 1648  . heparin injection 5,000 Units  5,000 Units Subcutaneous 3 times per day Dionne Milo, NP   5,000 Units at 03/11/15 1306  . hydrALAZINE (APRESOLINE) tablet 25 mg  25 mg Oral 3 times per day Dionne Milo, NP   25 mg at 03/11/15 1306  . insulin aspart (novoLOG) injection 0-9 Units  0-9 Units Subcutaneous TID WC Dionne Milo, NP   1 Units at 03/11/15 1649  . isosorbide mononitrate (IMDUR) 24 hr tablet 60 mg  60 mg Oral q morning - 10a Dionne Milo, NP   60 mg at 03/11/15 0906  . nitroGLYCERIN (NITROSTAT) SL tablet 0.4 mg  0.4 mg Sublingual Q5 min PRN Adrian Prows, MD      . ondansetron [facility]) injection 4 mg  4 mg Intravenous Q6H PRN Dionne Milo, NP      . PERFLUTREN LIPID MICROSPHERE injection SUSP        2 mL at 03/11/15 1225  . sodium chloride flush (NS) 0.9 % injection 3 mL  3 mL Intravenous Q12H Dionne Milo, NP   3 mL at 03/11/15 0907  . sodium chloride flush (NS) 0.9 % injection 3 mL  3 mL Intravenous PRN Dionne Milo, NP        Allergies as of 03/10/2015  . (No Known Allergies)    Family History  Problem Relation Age of Onset  . Heart disease Mother   . Diabetes Mother     Social History   Social History  . Marital Status: Married    Spouse Name: N/A  . Number of Children: N/A  . Years of Education: N/A   Occupational History  . Not on file.   Social History Main Topics  .  Smoking status: Former Smoker -- 1.00 packs/day for 5 years    Types: Cigarettes  . Smokeless tobacco: Never Used     Comment: "quit smoking in the 1960's"  . Alcohol Use: No  . Drug Use: No  . Sexual Activity: Not Currently   Other Topics Concern  . Not on file   Social History Narrative    Review of Systems: Gen: alert and cooperative  HEENT: No visual complaints, No history of Retinopathy. Normal  external appearance No Epistaxis or Sore throat. No sinusitis.   CV: Denies chest pain, angina, palpitations, syncope, orthopnea, PND, peripheral edema, and claudication. Resp: Denies dyspnea at rest, dyspnea with exercise, cough, sputum, wheezing, coughing up blood, and pleurisy. GI: Denies vomiting blood, jaundice, and fecal incontinence.   Denies dysphagia or odynophagia. GU : Denies urinary burning, blood in urine, urinary frequency, urinary hesitancy, nocturnal urination, and urinary incontinence.  No renal calculi. MS: Denies joint pain, limitation of movement, and swelling, stiffness, low back pain, extremity pain. Denies muscle weakness, cramps, atrophy.  No use of non steroidal antiinflammatory drugs. Derm: Denies rash, itching, dry skin, hives, moles, warts, or unhealing ulcers.  Psych: Denies depression, anxiety, memory loss, suicidal ideation, hallucinations, paranoia, and confusion. Heme: Denies bruising, bleeding, and enlarged lymph nodes. Neuro: No headache.  No diplopia. No dysarthria.  No dysphasia.  No history of CVA.  No Seizures. No paresthesias.  No weakness. Endocrine No DM.  No Thyroid disease.  No Adrenal disease.  Physical Exam: Vital signs in last 24 hours: Temp:  [97.4 F (36.3 C)-97.9 F (36.6 C)] 97.9 F (36.6 C) (02/16 2132) Pulse Rate:  [73-90] 73 (02/16 2132) Resp:  [18-28] 20 (02/16 2132) BP: (114-172)/(66-103) 114/66 mmHg (02/16 2132) SpO2:  [93 %-100 %] 100 % (02/16 2132) Weight:  [90.538 kg (199 lb 9.6 oz)] 90.538 kg (199 lb 9.6 oz) (02/16  0505) Last BM Date: 03/10/15 General:   Alert,   Ill appearing Head:  Normocephalic and atraumatic. Eyes:  Sclera clear, no icterus.   Conjunctiva pink. Ears:  Normal auditory acuity. Nose:  No deformity, discharge,  or lesions. Mouth:  No deformity or lesions, dentition normal. Neck:  Supple; no masses or thyromegaly. JVP not elevated Lungs:  Clear throughout to auscultation.   No wheezes, crackles, or rhonchi. No acute distress. Heart:  Regular rate and rhythm; no murmurs, clicks, rubs,  or gallops. Abdomen:  Soft, nontender and nondistended. No masses, hepatosplenomegaly or hernias noted. Normal bowel sounds, without guarding, and without rebound.   Msk:  Symmetrical without gross deformities. Normal posture. Pulses:  No carotid, renal, femoral bruits. DP and PT symmetrical and equal Extremities:  2 + pitting edema  Neurologic:  Alert and  oriented x4;  grossly normal neurologically. Skin:   Diffuse rashes  Cervical Nodes:  No significant cervical adenopathy. Psych:  Alert and cooperative. Normal mood and affect.  Intake/Output from previous day: 02/15 0701 - 02/16 0700 In: 350 [P.O.:240; IV Piggyback:110] Out: 275 [Urine:275] Intake/Output this shift:    Lab Results:  Recent Labs  03/10/15 1238 03/10/15 2138 03/11/15 0127  WBC 8.2 7.8 7.0  HGB 7.3* 7.2* 7.0*  HCT 21.8* 21.9* 20.0*  PLT 189 176 164   BMET  Recent Labs  03/10/15 1238 03/10/15 2138 03/11/15 0127  NA 139  --  136  K 4.2  --  4.2  CL 108  --  107  CO2 17*  --  16*  GLUCOSE 138*  --  132*  BUN 83*  --  81*  CREATININE 5.92* 6.13* 6.05*  CALCIUM 6.6*  --  6.4*   LFT  Recent Labs  03/10/15 2138  PROT 8.0  ALBUMIN 1.8*  AST 24  ALT 35  ALKPHOS 61  BILITOT 0.4  BILIDIR <0.1*  IBILI NOT CALCULATED   PT/INR No results for input(s): LABPROT, INR in the last 72 hours. Hepatitis Panel No results for input(s): HEPBSAG, HCVAB, HEPAIGM, HEPBIGM in the last 72 hours.  Studies/Results: Dg  Chest 2 View  03/10/2015  CLINICAL DATA:  Cough with shortness of breath for a few weeks. History of hypertension and diabetes. EXAM: CHEST  2 VIEW COMPARISON:  Radiographs 06/29/2014 and 08/03/2014. FINDINGS: Left subclavian AICD lead appears unchanged at the right ventricular apex. There is stable mild cardiac enlargement status post median sternotomy and CABG. Compared with the most recent study, the bilateral airspace opacities have partially cleared. There are residual opacities suspicious for edema. No significant residual pleural fluid. A small asymmetric nodular density inferiorly on the right on the frontal examination may reflect a nipple shadow. The bones appear unchanged. IMPRESSION: Recurrent congestive heart failure with pulmonary edema, less advanced than on the most recent study. Possible nipple shadow projecting over the right lung base; attention on follow-up after the edema has resolved recommended. Electronically Signed   By: Richardean Sale M.D.   On: 03/10/2015 12:51    Assessment/Plan:  Stage 5 chronic renal disease admitted with volume overload and somewhat poor response to diuretics. He has diastolic and systolic heart failure. There are no preparations for dialysis and patient could benefit from placement of permanent access. He will also need more emergent treatment of his volume overload ,if he continues to have a poor response to IV diuretics.  Hypertension  Patient taking carvedilol, lasix and clonidine and hydralazine and isosorbide mononitrate  Anemia will start ESA - iron stores adequate  Will need vein mapping will consult VVS in AM   LOS: 1 Shatera Rennert W @TODAY @9 :33 PM

## 2015-03-12 ENCOUNTER — Inpatient Hospital Stay (HOSPITAL_COMMUNITY): Payer: Medicaid Other

## 2015-03-12 DIAGNOSIS — N189 Chronic kidney disease, unspecified: Secondary | ICD-10-CM

## 2015-03-12 LAB — PROTEIN ELECTROPHORESIS, SERUM
A/G Ratio: 0.4 — ABNORMAL LOW (ref 0.7–1.7)
ALPHA-2-GLOBULIN: 1.1 g/dL — AB (ref 0.4–1.0)
Albumin ELP: 2 g/dL — ABNORMAL LOW (ref 2.9–4.4)
Alpha-1-Globulin: 0.3 g/dL (ref 0.0–0.4)
Beta Globulin: 1.7 g/dL — ABNORMAL HIGH (ref 0.7–1.3)
GLOBULIN, TOTAL: 5.1 g/dL — AB (ref 2.2–3.9)
Gamma Globulin: 2.1 g/dL — ABNORMAL HIGH (ref 0.4–1.8)
M-SPIKE, %: 0.7 g/dL — AB
Total Protein ELP: 7.1 g/dL (ref 6.0–8.5)

## 2015-03-12 LAB — BASIC METABOLIC PANEL
Anion gap: 11 (ref 5–15)
BUN: 85 mg/dL — AB (ref 6–20)
CHLORIDE: 109 mmol/L (ref 101–111)
CO2: 16 mmol/L — AB (ref 22–32)
CREATININE: 6.07 mg/dL — AB (ref 0.61–1.24)
Calcium: 6.5 mg/dL — ABNORMAL LOW (ref 8.9–10.3)
GFR calc Af Amer: 10 mL/min — ABNORMAL LOW (ref >60)
GFR calc non Af Amer: 9 mL/min — ABNORMAL LOW (ref >60)
Glucose, Bld: 90 mg/dL (ref 65–99)
Potassium: 4.3 mmol/L (ref 3.5–5.1)
SODIUM: 136 mmol/L (ref 135–145)

## 2015-03-12 LAB — CBC
HCT: 21.3 % — ABNORMAL LOW (ref 39.0–52.0)
Hemoglobin: 7 g/dL — ABNORMAL LOW (ref 13.0–17.0)
MCH: 28.7 pg (ref 26.0–34.0)
MCHC: 32.9 g/dL (ref 30.0–36.0)
MCV: 87.3 fL (ref 78.0–100.0)
PLATELETS: 174 10*3/uL (ref 150–400)
RBC: 2.44 MIL/uL — ABNORMAL LOW (ref 4.22–5.81)
RDW: 13.6 % (ref 11.5–15.5)
WBC: 6.3 10*3/uL (ref 4.0–10.5)

## 2015-03-12 LAB — URINE MICROSCOPIC-ADD ON

## 2015-03-12 LAB — URINALYSIS, ROUTINE W REFLEX MICROSCOPIC
BILIRUBIN URINE: NEGATIVE
GLUCOSE, UA: NEGATIVE mg/dL
KETONES UR: NEGATIVE mg/dL
Leukocytes, UA: NEGATIVE
NITRITE: NEGATIVE
PH: 5.5 (ref 5.0–8.0)
Protein, ur: >300 mg/dL — AB
Specific Gravity, Urine: 1.015 (ref 1.005–1.030)

## 2015-03-12 LAB — GLUCOSE, CAPILLARY
GLUCOSE-CAPILLARY: 66 mg/dL (ref 65–99)
Glucose-Capillary: 103 mg/dL — ABNORMAL HIGH (ref 65–99)
Glucose-Capillary: 103 mg/dL — ABNORMAL HIGH (ref 65–99)
Glucose-Capillary: 135 mg/dL — ABNORMAL HIGH (ref 65–99)
Glucose-Capillary: 123 mg/dL — ABNORMAL HIGH (ref 65–99)

## 2015-03-12 LAB — TROPONIN I
Troponin I: 0.05 ng/mL — ABNORMAL HIGH (ref <0.031)
Troponin I: 0.03 ng/mL (ref <0.031)

## 2015-03-12 LAB — PROTEIN, URINE, RANDOM: Total Protein, Urine: 642 mg/dL

## 2015-03-12 MED ORDER — BENZONATATE 100 MG PO CAPS
100.0000 mg | ORAL_CAPSULE | Freq: Three times a day (TID) | ORAL | Status: DC | PRN
Start: 2015-03-12 — End: 2015-03-20
  Administered 2015-03-12 – 2015-03-18 (×7): 100 mg via ORAL
  Filled 2015-03-12 (×7): qty 1

## 2015-03-12 MED ORDER — FUROSEMIDE 10 MG/ML IJ SOLN
160.0000 mg | Freq: Three times a day (TID) | INTRAMUSCULAR | Status: DC
  Administered 2015-03-12 – 2015-03-13 (×3): 160 mg via INTRAVENOUS
  Filled 2015-03-12 (×5): qty 16

## 2015-03-12 NOTE — Progress Notes (Signed)
[facility]                                                                              Patient Demographics  Eric [name], is a 60 y.o. male, DOB - [birth year], [id number]  Admit date - 03/10/2015   Admitting Physician Waldemar Dickens, MD  Outpatient Primary MD for the patient is [name], [name], NP  LOS - 2   Chief Complaint  Patient presents with  . Chest Pain  . Shortness of Breath       Brief HPI   Eric [name] is a 60 y.o. 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 Pt presents today with complaints of chest pain and shortness of breath for several days becoming increasingly worse prompting visit to ED.   Pt states he is compliant with home meds. He denied recent fever, dizziness or palpitations, abdominal pain, n/v/d. ED evaluation consistent with acute HF exacerbation, creatinine up to 5.9 from baseline around 3, Hgb 7.3. Pt was admitted for further evaluation and treatment.   Assessment & Plan    Acute on chronic mixed systolic/diastolic CHF: -BNP 4715, 2-D echo 6/16 had shown EF of 45-50%, moderate pulmonary hypertension with underlying history of chronic kidney disease, now stage IV-V - Appreciate cardiology and nephrology recommendations - No chest pain, troponins slightly elevated, likely due to acute on chronic CHF, CKD - 2-D echo showed EF of 45-50% with hypokinesis of the inferolateral myocardium, hypokinesis of the inferior myocardium, diastolic function normal - Continue strict I's and O's, still positive volume overload 5 53 mL - continue imdur, coreg.  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  - Likely will need Aranesp, will defer to nephrology   Chest pain with hx CAD -No chest pain currently, troponins slightly positive due to #1 and 2  -ASA qd , cardiology following   DM2 -A1C 6.2% 06/2014 -Continue sliding scale insulin  Hx hypertension -stable -continue home meds  Hx cardiac arrest s/p ICD placement  Hx Hep C cirrhosis LFTs normal  MGUS Follows outpt with [name] -  SPEP, UPEP in process, r/o multiple myeloma given worsening renal function, anemia  Code Status:FULL CODE  Family Communication: Discussed in detail with the patient, all imaging results, lab results explained to the patient  And wife at the bedside  Disposition Plan:  Time Spent in minutes 25 minutes  Procedures  2-D echo  Consults   Cardiology Nephrology  DVT Prophylaxis  heparin   Medications  Scheduled Meds: . aspirin EC  81 mg Oral Daily  . carvedilol  6.25 mg Oral BID  WC  . cloNIDine  0.1 mg Oral TID  . darbepoetin (ARANESP) injection - NON-DIALYSIS  150 mcg Subcutaneous Q Fri-1800  . furosemide  160 mg Intravenous TID  . heparin  5,000 Units Subcutaneous 3 times per day  . hydrALAZINE  25 mg Oral 3 times per day  . insulin aspart  0-9 Units Subcutaneous TID WC  . isosorbide mononitrate  60 mg Oral q morning - 10a  . sodium chloride flush  3 mL Intravenous Q12H   Continuous Infusions:  PRN Meds:.acetaminophen, nitroGLYCERIN, ondansetron (ZOFRAN) IV, sodium chloride flush   Antibiotics   Anti-infectives    None        Subjective:   Eric [name] was seen and examined today.  lower extremity swelling Persisting. No chest pain at the time of my examination, no change in shortness of  breath. Patient denies dizziness,  abdominal pain, N/V/D/C, new weakness, numbess, tingling. No acute events overnight.    Objective:   Filed Vitals:   03/11/15 2132 03/11/15 2141 03/11/15 2146 03/12/15 0602  BP: 114/66 107/78 112/69 140/82  Pulse: 73 71 71 67  Temp: 97.9 F (36.6 C)   97.8 F (36.6 C)  TempSrc: Oral   Oral  Resp: 20     Height:      Weight:    90.719 kg (200 lb)  SpO2: 100%   100%    Intake/Output Summary (Last 24 hours) at 03/12/15 1321 Last data filed at 03/12/15 1019  Gross per 24 hour  Intake   1063 ml  Output    521 ml  Net    542 ml     Wt Readings from Last 3 Encounters:  03/12/15 90.719 kg (200 lb)  08/08/14 82.827 kg (182 lb 9.6 oz)  07/24/14 87.091 kg (192 lb)     Exam  General: Alert and oriented x 3, NAD  HEENT:  PERRLA, EOMI  Neck: Supple, + JVD, no masses  CVS: S1 S2clear, regular rate and rhythm   Respiratory: Bibasilar crackles  Abdomen: Soft, nontender, distended, + bowel sounds  Ext: no cyanosis clubbing, 2+ edema  Neuro:no new deficits   Skin: No rashes  Psych: Normal affect and demeanor, alert and oriented x3    Data Review   Micro Results No results found for this or any previous visit (from the past 240 hour(s)).  Radiology Reports Dg Chest 2 View  03/10/2015  CLINICAL DATA:  Cough with shortness of breath for a few weeks. History of hypertension and diabetes. EXAM: CHEST  2 VIEW COMPARISON:  Radiographs 06/29/2014 and 08/03/2014. FINDINGS: Left subclavian AICD lead appears unchanged at the right ventricular apex. There is stable mild cardiac enlargement status post median sternotomy and CABG. Compared with the most recent study, the bilateral airspace opacities have partially cleared. There are residual opacities suspicious for edema. No significant residual pleural fluid. A small asymmetric nodular density inferiorly on the right on the frontal examination may reflect a nipple shadow. The bones appear unchanged.  IMPRESSION: Recurrent congestive heart failure with pulmonary edema, less advanced than on the most recent study. Possible nipple shadow projecting over the right lung base; attention on follow-up after the edema has resolved recommended. Electronically Signed   By: Richardean Sale M.D.   On: 03/10/2015 12:51    CBC  Recent Labs Lab 03/10/15 1238 03/10/15 2138 03/11/15 0127 03/12/15 0500  WBC 8.2 7.8 7.0 6.3  HGB 7.3* 7.2* 7.0* 7.0*  HCT 21.8* 21.9* 20.0* 21.3*  PLT 189 176 164  174  MCV 86.2 86.9 87.0 87.3  MCH 28.9 28.6 30.4 28.7  MCHC 33.5 32.9 35.0 32.9  RDW 13.3 13.3 13.5 13.6    Chemistries   Recent Labs Lab 03/10/15 1238 03/10/15 2138 03/11/15 0127 03/12/15 0500  NA 139  --  136 136  K 4.2  --  4.2 4.3  CL 108  --  107 109  CO2 17*  --  16* 16*  GLUCOSE 138*  --  132* 90  BUN 83*  --  81* 85*  CREATININE 5.92* 6.13* 6.05* 6.07*  CALCIUM 6.6*  --  6.4* 6.5*  AST  --  24  --   --   ALT  --  35  --   --   ALKPHOS  --  61  --   --   BILITOT  --  0.4  --   --    ------------------------------------------------------------------------------------------------------------------ estimated creatinine clearance is 13.5 mL/min (by C-G formula based on Cr of 6.07). ------------------------------------------------------------------------------------------------------------------ No results for input(s): HGBA1C in the last 72 hours. ------------------------------------------------------------------------------------------------------------------ No results for input(s): CHOL, HDL, LDLCALC, TRIG, CHOLHDL, LDLDIRECT in the last 72 hours. ------------------------------------------------------------------------------------------------------------------ No results for input(s): TSH, T4TOTAL, T3FREE, THYROIDAB in the last 72 hours.  Invalid input(s): FREET3 ------------------------------------------------------------------------------------------------------------------  Recent  Labs  03/11/15 1534  VITAMINB12 603  FOLATE 16.3  FERRITIN 122  TIBC 188*  IRON 41*  RETICCTPCT 1.6    Coagulation profile No results for input(s): INR, PROTIME in the last 168 hours.  No results for input(s): DDIMER in the last 72 hours.  Cardiac Enzymes  Recent Labs Lab 03/11/15 2122 03/12/15 0500 03/12/15 1002  TROPONINI 0.04* 0.05* 0.03   ------------------------------------------------------------------------------------------------------------------ Invalid input(s): POCBNP   Recent Labs  03/11/15 1056 03/11/15 1624 03/11/15 2127 03/12/15 0609 03/12/15 0647 03/12/15 1200  GLUCAP 88 124* 98 66 103* 103*     RAI,RIPUDEEP M.D. [facility] 03/12/2015, 1:21 PM  Pager: [phone] Between 7am to 7pm - call Pager - [phone]  After 7pm go to [email or web address] - password [email or web address]  Call night coverage person covering after 7pm

## 2015-03-12 NOTE — Evaluation (Signed)
Physical Therapy Evaluation Patient Details Name: Eric [name] MRN: [id number] DOB: [birth year] Today's Date: 03/12/2015   History of Present Illness  Patient is a 60 yo male admitted 03/10/15 with chest pain and SOB.  Patient with CHF exacerbation, worsening renal insufficiency, and anemia.   PMH:  HTN, CAD, MI, CABG, CHF, AICD placement, DM, peripheral neuropathy  Clinical Impression  Patient is functioning at Independent to Supervision level for all mobility and gait.  Good balance with gait with no assistive device.  No acute PT needs identified at this time.  PT will sign off.  Would recommend 3-in-1 BSC for use at home.    Follow Up Recommendations No PT follow up;Supervision for mobility/OOB    Equipment Recommendations  3in1 (PT)    Recommendations for Other Services       Precautions / Restrictions Precautions Precautions: Fall Restrictions Weight Bearing Restrictions: No      Mobility  Bed Mobility               General bed mobility comments: Patient OOB in chair  Transfers Overall transfer level: Independent Equipment used: None             General transfer comment: No physical assist needed.  Good balance in stance.  Ambulation/Gait Ambulation/Gait assistance: Supervision Ambulation Distance (Feet): 180 Feet Assistive device: None Gait Pattern/deviations: Step-through pattern;Decreased stride length Gait velocity: decreased Gait velocity interpretation: Below normal speed for age/gender General Gait Details: Supervision for safety only.  No loss of balance during gait.  Slow, steady gait pattern.  Stairs            Wheelchair Mobility    Modified Rankin (Stroke Patients Only)       Balance Overall balance assessment: No apparent balance deficits (not formally assessed)                           High level balance activites: Direction changes;Turns;Sudden stops;Head turns High Level Balance Comments: No loss of balance  with high level balance activities             Pertinent Vitals/Pain Pain Assessment: No/denies pain    Home Living Family/patient expects to be discharged to:: Private residence Living Arrangements: Spouse/significant other;Children Available Help at Discharge: Family;Available 24 hours/day Type of Home: House Home Access: Stairs to enter Entrance Stairs-Rails: None Entrance Stairs-Number of Steps: 2 Home Layout: One level Home Equipment: Walker - 2 wheels      Prior Function Level of Independence: Independent;Needs assistance   Gait / Transfers Assistance Needed: Independent with gait  ADL's / Homemaking Assistance Needed: Wife assists with bathing and meal prep per patient        Hand Dominance        Extremity/Trunk Assessment   Upper Extremity Assessment: Overall WFL for tasks assessed           Lower Extremity Assessment: RLE deficits/detail;LLE deficits/detail RLE Deficits / Details: Edema and neuropathy LLE Deficits / Details: Edema, especially in foot, and neuropathy  Cervical / Trunk Assessment: Normal  Communication   Communication: No difficulties  Cognition Arousal/Alertness: Awake/alert Behavior During Therapy: WFL for tasks assessed/performed Overall Cognitive Status: Within Functional Limits for tasks assessed                      General Comments General comments (skin integrity, edema, etc.): Edema noted BLE's, especially in Lt foot    Exercises  Assessment/Plan    PT Assessment Patent does not need any further PT services  PT Diagnosis Generalized weakness   PT Problem List    PT Treatment Interventions     PT Goals (Current goals can be found in the Care Plan section) Acute Rehab PT Goals PT Goal Formulation: All assessment and education complete, DC therapy    Frequency     Barriers to discharge        Co-evaluation               End of Session   Activity Tolerance: Patient tolerated treatment  well Patient left: in chair;with call bell/phone within reach;with family/visitor present Nurse Communication: Mobility status (No PT needs identified.  Continue ambulation. Needs BSC)         Time: 17:26-17:30 and  [id number] PT Time Calculation (min) (ACUTE ONLY): 4 min + 11 min = 15 min total   Charges:   PT Evaluation $PT Eval Moderate Complexity: 1 Procedure     PT G Codes:        Despina Pole 09-Apr-2015, 6:59 PM Carita Pian. Sanjuana Kava, [place] Pager [phone]

## 2015-03-12 NOTE — Progress Notes (Signed)
Subjective:  No change in symptoms. Had CP last night lasting couple hours. States he is  urinating but edema persists.   Objective:  Vital Signs in the last 24 hours: Temp:  [97.4 F (36.3 C)-97.9 F (36.6 C)] 97.8 F (36.6 C) (02/17 0602) Pulse Rate:  [67-83] 67 (02/17 0602) Resp:  [18-20] 20 (02/16 2132) BP: (107-140)/(66-82) 140/82 mmHg (02/17 0602) SpO2:  [99 %-100 %] 100 % (02/17 0602) Weight:  [90.719 kg (200 lb)] 90.719 kg (200 lb) (02/17 0602)   Blood pressure 119/66, pulse 83, temperature 97.9 F (36.6 C), temperature source Oral, resp. rate 18, height 5\' 6"  (1.676 m), weight 90.538 kg (199 lb 9.6 oz), SpO2 99 %.   Intake/Output from previous day: 02/16 0701 - 02/17 0700 In: 24 [P.O.:720] Out: 523 [Urine:522; Stool:1]  Physical Exam:  General appearance: alert, cooperative, appears older than stated age, no distress and  flat lesion(pigmented lesions diffuse). Size - Description of size - small. Description - Distribution/Arrangement - diffuse and generalized. noted on the skin and old. Lungs: rhonchi bibasilar Heart: regular rate and rhythm, S1, S2 normal, no murmur, click, rub or gallop JVD noted up to the angle of the jaw. Abdomen: Distended, hepatomegaly present, hepatojugular reflux present, probable ascites present. Extremities: edema 3 plus bilateral and pitting Pulses: Femoral pulses 1-2 plus,, right much weaker than left, popliteal pulses difficult to feel, pedal pulses faint bilaterally. Neurologic: Grossly normal.   Lab Results: BMP  Recent Labs  03/10/15 1238 03/10/15 2138 03/11/15 0127 03/12/15 0500  NA 139  --  136 136  K 4.2  --  4.2 4.3  CL 108  --  107 109  CO2 17*  --  16* 16*  GLUCOSE 138*  --  132* 90  BUN 83*  --  81* 85*  CREATININE 5.92* 6.13* 6.05* 6.07*  CALCIUM 6.6*  --  6.4* 6.5*  GFRNONAA 9* 9* 9* 9*  GFRAA 11* 10* 10* 10*    CBC  Recent Labs Lab 03/12/15 0500  WBC 6.3  RBC 2.44*  HGB 7.0*  HCT 21.3*  PLT 174   MCV 87.3  MCH 28.7  MCHC 32.9  RDW 13.6    HEMOGLOBIN A1C Lab Results  Component Value Date   HGBA1C 6.2* 07/01/2014   MPG 131 07/01/2014    Cardiac Panel (last 3 results)  Recent Labs  06/29/14 2020  03/11/15 0736 03/11/15 2122 03/12/15 0500  CKTOTAL 294  --   --   --   --   TROPONINI  --   < > 0.05* 0.04* 0.05*  < > = values in this interval not displayed.   Recent Labs  06/29/14 2020 08/03/14 0843  TSH 2.800 2.421     Recent Labs  07/03/14 0359  07/24/14 0855 08/03/14 0843 03/10/15 2138  PROT 7.8  --  7.6 8.1 8.0  ALBUMIN 2.1*  < > 2.5* 2.1* 1.8*  AST 22  --  22 27 24   ALT 26  --  23 24 35  ALKPHOS 56  --  68 69 61  BILITOT 0.4  --  0.4 0.5 0.4  BILIDIR <0.1*  --   --   --  <0.1*  IBILI NOT CALCULATED  --   --   --  NOT CALCULATED  < > = values in this interval not displayed.  Cardiac Studies: EKG 03/11/2015: Normal sinus rhythm at rate of 71 bpm, borderline criteria for LVH, T-wave inversion consider lateral ischemia.  No significant change compared to  EKG 03/11/2015.  Echo 02/60/2017: LVEF [id number], systolic function mildly reduced, mild to moderate LVH, inferior and inferolateral wall severe hypokinesis, moderate to severe posterior directed mitral regurgitation, consider papillary muscle dysfunction.  Moderate urinary hypertension.  No significant change from 07/01/2014.  Assessment/Plan:  1. Atherosclerosis of native coronary artery of native heart without angina pectoris  CABG 1999: Coronary angio 02/24/2013: Diffuse distal LAD disease, RCA stenosis not felt to be amenable for PCI, patent LIMA to LAD and SVG to OM1.  2. Acute on Chronic diastolic heart failure,  BNP also elevated today and this is probably chronic and baseline.   3. Benign essential hypertension  4. Controlled type 2 diabetes mellitus with complication, without long-term current use of insulin  5. Acute on CKD (chronic kidney disease), stage  5 (severe)   6. AICD (automatic  cardioverter/defibrillator) present  [facility] Defibrillator [phone]. Physician: Dr [name].  Rec:  From cardiac standpoint, not much to offer, he is on appropriate medical therapy, his fluid overload state is related to renal failure. No change in the echocardiogram from previous.  Please call me if you need my assistance further.  Adrian Prows, M.D. 03/12/2015, 10:01 AM [facility] Pager: [phone] Office: [phone] If no answer: [phone]

## 2015-03-12 NOTE — Progress Notes (Signed)
Utilization review completed. Corvin Sorbo, RN, BSN. 

## 2015-03-12 NOTE — Progress Notes (Signed)
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   Hypertension Suboptimal  Anemia will start ESA - iron stores adequate  Plan: Will increase diuretic dosage                             Vein mapping & consult with VVS requested   Subjective: Interval History: No new complaints  Objective: Vital signs in last 24 hours: Temp:  [97.4 F (36.3 [name])-97.9 F (36.6 [name])] 97.8 F (36.6 [name]) (02/17 0602) Pulse Rate:  [67-83] 67 (02/17 0602) Resp:  [18-20] 20 (02/16 2132) BP: (107-140)/(66-82) 140/82 mmHg (02/17 0602) SpO2:  [99 %-100 %] 100 % (02/17 0602) Weight:  [90.719 kg (200 lb)] 90.719 kg (200 lb) (02/17 0602) Weight change: 0.907 kg (2 lb)  Intake/Output from previous day: 02/16 0701 - 02/17 0700 In: 720 [P.O.:720] Out: 523 [Urine:522; Stool:1] Intake/Output this shift: Total I/O In: 243 [P.O.:240; I.V.:3] Out: -   General appearance: alert and cooperative Resp: diminished breath sounds bibasilar Cardio: regular rate and rhythm, S1, S2 normal, no murmur, click, rub or gallop Extremities: edema 2-3+  Lab Results:  Recent Labs  03/11/15 0127 03/12/15 0500  WBC 7.0 6.3  HGB 7.0* 7.0*  HCT 20.0* 21.3*  PLT 164 174   BMET:  Recent Labs  03/11/15 0127 03/12/15 0500  NA 136 136  K 4.2 4.3  CL 107 109  CO2 16* 16*  GLUCOSE 132* 90  BUN 81* 85*  CREATININE 6.05* 6.07*  CALCIUM 6.4* 6.5*   No results for input(s): PTH in the last 72 hours. Iron Studies:  Recent Labs  03/11/15 1534  IRON 41*  TIBC 188*  FERRITIN 122   Studies/Results: Dg Chest 2 View  03/10/2015  CLINICAL DATA:  Cough with shortness of breath for a few weeks. History of hypertension and diabetes. EXAM: CHEST  2 VIEW COMPARISON:  Radiographs 06/29/2014 and 08/03/2014. FINDINGS: Left subclavian AICD lead appears unchanged at the right ventricular apex. There is stable mild cardiac enlargement status post median sternotomy and CABG. Compared with the most  recent study, the bilateral airspace opacities have partially cleared. There are residual opacities suspicious for edema. No significant residual pleural fluid. A small asymmetric nodular density inferiorly on the right on the frontal examination may reflect a nipple shadow. The bones appear unchanged. IMPRESSION: Recurrent congestive heart failure with pulmonary edema, less advanced than on the most recent study. Possible nipple shadow projecting over the right lung base; attention on follow-up after the edema has resolved recommended. Electronically Signed   By: Richardean Sale M.D.   On: 03/10/2015 12:51   Scheduled: . aspirin EC  81 mg Oral Daily  . carvedilol  6.25 mg Oral BID WC  . cloNIDine  0.1 mg Oral TID  . darbepoetin (ARANESP) injection - NON-DIALYSIS  150 mcg Subcutaneous Q Fri-1800  . furosemide  80 mg Intravenous BID  . heparin  5,000 Units Subcutaneous 3 times per day  . hydrALAZINE  25 mg Oral 3 times per day  . insulin aspart  0-9 Units Subcutaneous TID WC  . isosorbide mononitrate  60 mg Oral q morning - 10a  . sodium chloride flush  3 mL Intravenous Q12H     LOS: 2 days   Eric [name] 03/12/2015,10:11 AM

## 2015-03-12 NOTE — Progress Notes (Signed)
Right  Upper Extremity Vein Map    Cephalic  Segment Diameter Depth Comment  1. Axilla 3.3 mm 6.5 mm   2. Mid upper arm 2.4 mm 4.2 mm   3. Above AC 2.6 mm 4.9 mm   4. In AC 4.4 mm 3.9 mm   5. Below AC 4.9 mm 4.1 mm Branch  6. Mid forearm 2.8 mm 3.4 mm   7. Wrist 3.1 mm 2.3 mm    Basilic  Segment Diameter Depth Comment  1. Axilla 3.9 mm 11.2 mm   2. Mid upper arm 3.8 mm 13.4 mm   3. Above AC 4.0 mm 15.9 mm   4. Below AC 3.1 mm 19.2 mm     Left Upper Extremity Vein Map    Cephalic  Segment Diameter Depth Comment  1. Axilla 5.1 mm 9.0 mm   2. Mid upper arm 5.3 mm 2.9 mm   3. Above AC 3.9 mm 4.3 mm   4. In AC 3.7 mm 5.3 mm   5. Below AC 1.9 mm 3.2 mm   6. Mid forearm 2.0 mm 3.4 mm   7. Wrist 2.2 mm 4.1 mm    Basilic  Segment Diameter Depth Comment  1. Axilla 4.3 mm 13.2 mm   2. Mid upper arm 4.2 mm 5.2 mm   3. Above AC 3.3 mm 3.7 mm   4. Below AC 1.4 mm 4.1 mm    Janifer Adie, RVT, RDMS 03/12/2015

## 2015-03-13 ENCOUNTER — Encounter (HOSPITAL_COMMUNITY): Payer: Self-pay | Admitting: Certified Registered Nurse Anesthetist

## 2015-03-13 ENCOUNTER — Inpatient Hospital Stay (HOSPITAL_COMMUNITY): Payer: Medicaid Other

## 2015-03-13 ENCOUNTER — Inpatient Hospital Stay (HOSPITAL_COMMUNITY): Payer: Medicaid Other | Admitting: Anesthesiology

## 2015-03-13 ENCOUNTER — Encounter (HOSPITAL_COMMUNITY)
Admission: EM | Disposition: A | Discharge status: Home / Self Care | Payer: Self-pay | Source: Home / Self Care | Attending: Internal Medicine

## 2015-03-13 HISTORY — PX: INSERTION OF DIALYSIS CATHETER: SHX1324

## 2015-03-13 LAB — CBC
HCT: 20.9 % — ABNORMAL LOW (ref 39.0–52.0)
HCT: 21.7 % — ABNORMAL LOW (ref 39.0–52.0)
HEMOGLOBIN: 7 g/dL — AB (ref 13.0–17.0)
Hemoglobin: 7 g/dL — ABNORMAL LOW (ref 13.0–17.0)
MCH: 29.5 pg (ref 26.0–34.0)
MCH: 28.3 pg (ref 26.0–34.0)
MCHC: 33.5 g/dL (ref 30.0–36.0)
MCHC: 32.3 g/dL (ref 30.0–36.0)
MCV: 88.2 fL (ref 78.0–100.0)
MCV: 87.9 fL (ref 78.0–100.0)
PLATELETS: 206 10*3/uL (ref 150–400)
Platelets: 235 10*3/uL (ref 150–400)
RBC: 2.37 MIL/uL — AB (ref 4.22–5.81)
RBC: 2.47 MIL/uL — ABNORMAL LOW (ref 4.22–5.81)
RDW: 13.7 % (ref 11.5–15.5)
RDW: 13.5 % (ref 11.5–15.5)
WBC: 6.8 10*3/uL (ref 4.0–10.5)
WBC: 7.7 10*3/uL (ref 4.0–10.5)

## 2015-03-13 LAB — GLUCOSE, CAPILLARY
GLUCOSE-CAPILLARY: 159 mg/dL — AB (ref 65–99)
GLUCOSE-CAPILLARY: 139 mg/dL — AB (ref 65–99)
Glucose-Capillary: 71 mg/dL (ref 65–99)
Glucose-Capillary: 95 mg/dL (ref 65–99)

## 2015-03-13 LAB — BASIC METABOLIC PANEL
Anion gap: 16 — ABNORMAL HIGH (ref 5–15)
BUN: 88 mg/dL — AB (ref 6–20)
CALCIUM: 6.6 mg/dL — AB (ref 8.9–10.3)
CHLORIDE: 104 mmol/L (ref 101–111)
CO2: 14 mmol/L — AB (ref 22–32)
CREATININE: 6.34 mg/dL — AB (ref 0.61–1.24)
GFR calc non Af Amer: 8 mL/min — ABNORMAL LOW (ref >60)
GFR, EST AFRICAN AMERICAN: 10 mL/min — AB (ref >60)
GLUCOSE: 75 mg/dL (ref 65–99)
Potassium: 4.4 mmol/L (ref 3.5–5.1)
Sodium: 134 mmol/L — ABNORMAL LOW (ref 135–145)

## 2015-03-13 LAB — RENAL FUNCTION PANEL
ALBUMIN: 1.9 g/dL — AB (ref 3.5–5.0)
ANION GAP: 15 (ref 5–15)
BUN: 86 mg/dL — ABNORMAL HIGH (ref 6–20)
CALCIUM: 6.5 mg/dL — AB (ref 8.9–10.3)
CO2: 17 mmol/L — ABNORMAL LOW (ref 22–32)
Chloride: 99 mmol/L — ABNORMAL LOW (ref 101–111)
Creatinine, Ser: 6.34 mg/dL — ABNORMAL HIGH (ref 0.61–1.24)
GFR, EST AFRICAN AMERICAN: 10 mL/min — AB (ref >60)
GFR, EST NON AFRICAN AMERICAN: 8 mL/min — AB (ref >60)
GLUCOSE: 92 mg/dL (ref 65–99)
PHOSPHORUS: 7.2 mg/dL — AB (ref 2.5–4.6)
Potassium: 4.2 mmol/L (ref 3.5–5.1)
SODIUM: 131 mmol/L — AB (ref 135–145)

## 2015-03-13 LAB — SURGICAL PCR SCREEN
MRSA, PCR: NEGATIVE
Staphylococcus aureus: NEGATIVE

## 2015-03-13 LAB — HEMOGLOBIN A1C
HEMOGLOBIN A1C: 5.9 % — AB (ref 4.8–5.6)
Mean Plasma Glucose: 123 mg/dL

## 2015-03-13 LAB — PROTIME-INR
INR: 1.43 (ref 0.00–1.49)
PROTHROMBIN TIME: 17.6 s — AB (ref 11.6–15.2)

## 2015-03-13 SURGERY — INSERTION OF DIALYSIS CATHETER
Anesthesia: Monitor Anesthesia Care | Site: Neck

## 2015-03-13 MED ORDER — OXYCODONE HCL 5 MG/5ML PO SOLN: 5.0000 mg | Freq: Once | ORAL | Status: DC | PRN

## 2015-03-13 MED ORDER — MIDAZOLAM HCL 5 MG/5ML IJ SOLN
INTRAMUSCULAR | Status: DC | PRN
  Administered 2015-03-13 (×2): 1 mg via INTRAVENOUS

## 2015-03-13 MED ORDER — PHENYLEPHRINE HCL 10 MG/ML IJ SOLN
INTRAMUSCULAR | Status: AC
  Filled 2015-03-13: qty 1

## 2015-03-13 MED ORDER — INSULIN ASPART 100 UNIT/ML ~~LOC~~ SOLN
0.0000 [IU] | Freq: Three times a day (TID) | SUBCUTANEOUS | Status: DC
  Administered 2015-03-13: 2 [IU] via SUBCUTANEOUS
  Administered 2015-03-13: 1 [IU] via SUBCUTANEOUS
  Administered 2015-03-14 – 2015-03-15 (×2): 2 [IU] via SUBCUTANEOUS
  Administered 2015-03-16: 1 [IU] via SUBCUTANEOUS
  Administered 2015-03-16 – 2015-03-17 (×2): 3 [IU] via SUBCUTANEOUS
  Administered 2015-03-17: 7 [IU] via SUBCUTANEOUS
  Administered 2015-03-18: 2 [IU] via SUBCUTANEOUS
  Administered 2015-03-18: 1 [IU] via SUBCUTANEOUS
  Administered 2015-03-19: 2 [IU] via SUBCUTANEOUS
  Administered 2015-03-19: 1 [IU] via SUBCUTANEOUS
  Administered 2015-03-20: 2 [IU] via SUBCUTANEOUS
  Administered 2015-03-20: 1 [IU] via SUBCUTANEOUS

## 2015-03-13 MED ORDER — SODIUM CHLORIDE 0.9 % IV SOLN
INTRAVENOUS | Status: DC | PRN
  Administered 2015-03-13: 35 mL

## 2015-03-13 MED ORDER — OXYCODONE HCL 5 MG PO TABS: 5.0000 mg | ORAL_TABLET | Freq: Once | ORAL | Status: DC | PRN

## 2015-03-13 MED ORDER — SODIUM CHLORIDE 0.9 % IV SOLN
INTRAVENOUS | Status: DC | PRN
  Administered 2015-03-13: 07:00:00 via INTRAVENOUS

## 2015-03-13 MED ORDER — LIDOCAINE-EPINEPHRINE (PF) 1 %-1:200000 IJ SOLN
INTRAMUSCULAR | Status: DC | PRN
  Administered 2015-03-13: 27 mL

## 2015-03-13 MED ORDER — CHLORHEXIDINE GLUCONATE CLOTH 2 % EX PADS
6.0000 | MEDICATED_PAD | Freq: Every day | CUTANEOUS | Status: DC
  Administered 2015-03-13 – 2015-03-20 (×4): 6 via TOPICAL

## 2015-03-13 MED ORDER — LIDOCAINE-PRILOCAINE 2.5-2.5 % EX CREA: 1.0000 "application " | TOPICAL_CREAM | CUTANEOUS | Status: DC | PRN

## 2015-03-13 MED ORDER — HEPARIN SODIUM (PORCINE) 1000 UNIT/ML IJ SOLN
INTRAMUSCULAR | Status: DC | PRN
  Administered 2015-03-13: 4.6 mL via INTRAVENOUS

## 2015-03-13 MED ORDER — ACETAMINOPHEN 325 MG PO TABS
ORAL_TABLET | ORAL | Status: AC
  Administered 2015-03-13: 650 mg
  Filled 2015-03-13: qty 2

## 2015-03-13 MED ORDER — FENTANYL CITRATE (PF) 100 MCG/2ML IJ SOLN
INTRAMUSCULAR | Status: DC | PRN
  Administered 2015-03-13 (×2): 50 ug via INTRAVENOUS

## 2015-03-13 MED ORDER — PENTAFLUOROPROP-TETRAFLUOROETH EX AERO: 1.0000 "application " | INHALATION_SPRAY | CUTANEOUS | Status: DC | PRN

## 2015-03-13 MED ORDER — DEXTROSE 5 % IV SOLN: 1.5000 g | INTRAVENOUS | Status: DC

## 2015-03-13 MED ORDER — LIDOCAINE HCL (PF) 1 % IJ SOLN: 5.0000 mL | INTRAMUSCULAR | Status: DC | PRN

## 2015-03-13 MED ORDER — FENTANYL CITRATE (PF) 250 MCG/5ML IJ SOLN
INTRAMUSCULAR | Status: AC
Start: 2015-03-13 — End: 2015-03-13
  Filled 2015-03-13: qty 5

## 2015-03-13 MED ORDER — LIDOCAINE-EPINEPHRINE (PF) 1 %-1:200000 IJ SOLN
INTRAMUSCULAR | Status: AC
  Filled 2015-03-13: qty 30

## 2015-03-13 MED ORDER — HEPARIN SODIUM (PORCINE) 1000 UNIT/ML DIALYSIS: 1000.0000 [IU] | INTRAMUSCULAR | Status: DC | PRN

## 2015-03-13 MED ORDER — LIDOCAINE HCL (CARDIAC) 20 MG/ML IV SOLN
INTRAVENOUS | Status: AC
  Filled 2015-03-13: qty 5

## 2015-03-13 MED ORDER — ALTEPLASE 2 MG IJ SOLR: 2.0000 mg | Freq: Once | INTRAMUSCULAR | Status: DC | PRN

## 2015-03-13 MED ORDER — KETOROLAC TROMETHAMINE 15 MG/ML IJ SOLN
15.0000 mg | Freq: Once | INTRAMUSCULAR | Status: AC
  Administered 2015-03-13: 15 mg via INTRAVENOUS
  Filled 2015-03-13: qty 1

## 2015-03-13 MED ORDER — SODIUM CHLORIDE 0.9 % IV SOLN: 100.0000 mL | INTRAVENOUS | Status: DC | PRN

## 2015-03-13 MED ORDER — SODIUM CHLORIDE 0.9 % IV SOLN
100.0000 mL | INTRAVENOUS | Status: DC | PRN
  Administered 2015-03-18: 08:00:00 via INTRAVENOUS

## 2015-03-13 MED ORDER — HEPARIN SODIUM (PORCINE) 1000 UNIT/ML IJ SOLN
INTRAMUSCULAR | Status: AC
  Filled 2015-03-13: qty 1

## 2015-03-13 MED ORDER — PROPOFOL 10 MG/ML IV BOLUS
INTRAVENOUS | Status: AC
  Filled 2015-03-13: qty 20

## 2015-03-13 MED ORDER — DEXTROSE 50 % IV SOLN
25.0000 mL | Freq: Once | INTRAVENOUS | Status: AC
  Administered 2015-03-13: 25 mL via INTRAVENOUS
  Filled 2015-03-13: qty 50

## 2015-03-13 MED ORDER — DEXTROSE 5 % IV SOLN
1.5000 g | INTRAVENOUS | Status: AC
  Administered 2015-03-13: 1.5 g via INTRAVENOUS
  Filled 2015-03-13 (×2): qty 1.5

## 2015-03-13 MED ORDER — HEPARIN SODIUM (PORCINE) 1000 UNIT/ML DIALYSIS: 20.0000 [IU]/kg | INTRAMUSCULAR | Status: DC | PRN

## 2015-03-13 MED ORDER — SUCCINYLCHOLINE CHLORIDE 20 MG/ML IJ SOLN
INTRAMUSCULAR | Status: AC
  Filled 2015-03-13: qty 1

## 2015-03-13 MED ORDER — MIDAZOLAM HCL 2 MG/2ML IJ SOLN
INTRAMUSCULAR | Status: AC
  Filled 2015-03-13: qty 2

## 2015-03-13 MED ORDER — KETOROLAC TROMETHAMINE 15 MG/ML IJ SOLN
INTRAMUSCULAR | Status: AC
  Filled 2015-03-13: qty 1

## 2015-03-13 MED ORDER — FENTANYL CITRATE (PF) 100 MCG/2ML IJ SOLN: 25.0000 ug | INTRAMUSCULAR | Status: DC | PRN

## 2015-03-13 SURGICAL SUPPLY — 40 items
BAG DECANTER FOR FLEXI CONT (MISCELLANEOUS) ×3 IMPLANT
BIOPATCH RED 1 DISK 7.0 (GAUZE/BANDAGES/DRESSINGS) ×2 IMPLANT
BIOPATCH RED 1IN DISK 7.0MM (GAUZE/BANDAGES/DRESSINGS) ×1
CATH PALINDROME RT-P 15FX19CM (CATHETERS) IMPLANT
CATH PALINDROME RT-P 15FX23CM (CATHETERS) ×3 IMPLANT
CATH PALINDROME RT-P 15FX28CM (CATHETERS) IMPLANT
CATH PALINDROME RT-P 15FX55CM (CATHETERS) IMPLANT
CATH STRAIGHT 5FR 65CM (CATHETERS) IMPLANT
COVER PROBE W GEL 5X96 (DRAPES) ×3 IMPLANT
DERMABOND ADHESIVE PROPEN (GAUZE/BANDAGES/DRESSINGS) ×2
DERMABOND ADVANCED .7 DNX6 (GAUZE/BANDAGES/DRESSINGS) ×1 IMPLANT
DRAPE C-ARM 42X72 X-RAY (DRAPES) ×3 IMPLANT
DRAPE CHEST BREAST 15X10 FENES (DRAPES) ×3 IMPLANT
GAUZE SPONGE 2X2 8PLY STRL LF (GAUZE/BANDAGES/DRESSINGS) ×1 IMPLANT
GAUZE SPONGE 4X4 16PLY XRAY LF (GAUZE/BANDAGES/DRESSINGS) ×3 IMPLANT
GLOVE BIO SURGEON STRL SZ7 (GLOVE) ×3 IMPLANT
GLOVE BIOGEL PI IND STRL 7.5 (GLOVE) ×1 IMPLANT
GLOVE BIOGEL PI INDICATOR 7.5 (GLOVE) ×2
GOWN STRL REUS W/ TWL LRG LVL3 (GOWN DISPOSABLE) ×2 IMPLANT
GOWN STRL REUS W/TWL LRG LVL3 (GOWN DISPOSABLE) ×4
KIT BASIN OR (CUSTOM PROCEDURE TRAY) ×3 IMPLANT
KIT ROOM TURNOVER OR (KITS) ×3 IMPLANT
LIQUID BAND (GAUZE/BANDAGES/DRESSINGS) IMPLANT
NEEDLE 18GX1X1/2 (RX/OR ONLY) (NEEDLE) ×3 IMPLANT
NEEDLE HYPO 25GX1X1/2 BEV (NEEDLE) ×3 IMPLANT
NS IRRIG 1000ML POUR BTL (IV SOLUTION) ×3 IMPLANT
PACK SURGICAL SETUP 50X90 (CUSTOM PROCEDURE TRAY) ×3 IMPLANT
PAD ARMBOARD 7.5X6 YLW CONV (MISCELLANEOUS) ×6 IMPLANT
SET MICROPUNCTURE 5F STIFF (MISCELLANEOUS) IMPLANT
SOAP 2 % CHG 4 OZ (WOUND CARE) ×3 IMPLANT
SPONGE GAUZE 2X2 STER 10/PKG (GAUZE/BANDAGES/DRESSINGS) ×2
SUT ETHILON 3 0 PS 1 (SUTURE) ×3 IMPLANT
SUT MNCRL AB 4-0 PS2 18 (SUTURE) ×3 IMPLANT
SYR 20CC LL (SYRINGE) ×6 IMPLANT
SYR 3ML LL SCALE MARK (SYRINGE) ×3 IMPLANT
SYR 5ML LL (SYRINGE) ×3 IMPLANT
SYR CONTROL 10ML LL (SYRINGE) ×3 IMPLANT
SYRINGE 10CC LL (SYRINGE) ×3 IMPLANT
WATER STERILE IRR 1000ML POUR (IV SOLUTION) IMPLANT
WIRE AMPLATZ SS-J .035X180CM (WIRE) IMPLANT

## 2015-03-13 NOTE — Consult Note (Addendum)
Referred by:  Dr. Florene Glen  Reason for referral: New access  History of Present Illness  Severt Amore is a 60 y.o. (31-Dec-1955) male who presents for evaluation for permanent access.  The patient is right hand dominant.  The patient has not had previous access procedures.  Previous central venous cannulation procedures include: multiple CVL.  The patient has had a L AICD placed.   Past Medical History  Diagnosis Date  . Hypertension   . Hyperlipemia   . Edema   . 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 Obesity   . Ventricular tachycardia ([place])   . AICD (automatic cardioverter/defibrillator) present   . CHF (congestive heart failure) ([place])   . Myocardial infarction ([place]) 03/2013  . Pneumonia 2016  . History of blood transfusion 1950's    "related to pinal menigitis; HAD 16 OPERATIONS TOTAL"  . Scabies infestation 06/29/2014  . Shortness of breath dyspnea   . Type II diabetes mellitus ([place])     type 2  . CKD (chronic kidney disease), stage III     Past Surgical History  Procedure Laterality Date  . Implantable cardioverter defibrillator implant  03/24/2013    STJ single chamber ICD implanted by Dr Caryl Comes for secondary prevention  . Left heart catheterization with coronary/graft angiogram  03/07/2013    Procedure: LEFT HEART CATHETERIZATION WITH [name];  Surgeon: Clent Demark, MD;  Location[facility] CATH LAB;  Service: Cardiovascular;;  . Implantable cardioverter defibrillator implant N/A 03/24/2013    Procedure: IMPLANTABLE CARDIOVERTER DEFIBRILLATOR IMPLANT;  Surgeon: Deboraha Sprang, MD;  Location: [facility] CATH LAB;  Service: Cardiovascular;  Laterality: N/A;  . Cardiac catheterization    . Head surgery  1950'S    "for spinal menigitis; HAD 16 OPERATIONS TOTAL"  . Back surgery  1950's    "for spinal menigitis; HAD 16 OPERATIONS TOTAL"  . 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 Coronary artery bypass graft   1999    cabg x4    Social History   Social History  . Marital Status: Married    Spouse Name: N/A  . Number of Children: N/A  . Years of Education: N/A   Occupational History  . Not on file.   Social History Main Topics  . Smoking status: Former Smoker -- 1.00 packs/day for 5 years    Types: Cigarettes  . Smokeless tobacco: Never Used     Comment: "quit smoking in the 1960's"  . Alcohol Use: No  . Drug Use: No  . Sexual Activity: Not Currently   Other Topics Concern  . Not on file   Social History Narrative    Family History  Problem Relation Age of Onset  . Heart disease Mother   . Diabetes Mother     Current Facility-Administered Medications  Medication Dose Route Frequency Provider Last Rate Last Dose  . acetaminophen (TYLENOL) tablet 650 mg  650 mg Oral Q4H PRN Dionne Milo, NP      . aspirin EC tablet 81 mg  81 mg Oral Daily Dionne Milo, NP   81 mg at 03/12/15 [id number]  . benzonatate (TESSALON) capsule 100 mg  100 mg Oral TID PRN Ritta Slot, NP   100 mg at 03/12/15 2201  . carvedilol (COREG) tablet 6.25 mg  6.25 mg Oral BID WC Dionne Milo, NP   6.25 mg at 03/12/15 1657  . cefUROXime (ZINACEF) 1.5 g  in dextrose 5 % 50 mL IVPB  1.5 g Intravenous On Call to [place], PA-C      . Chlorhexidine Gluconate Cloth 2 % PADS 6 each  6 each Topical Q0600 Ripudeep Krystal Eaton, MD   6 each at 03/13/15 0549  . cloNIDine (CATAPRES) tablet 0.1 mg  0.1 mg Oral TID Dionne Milo, NP   0.1 mg at 03/12/15 2154  . Darbepoetin Alfa (ARANESP) injection 150 mcg  150 mcg Subcutaneous Q Fri-1800 Edrick Oh, MD   150 mcg at 03/12/15 1802  . furosemide (LASIX) 160 mg in dextrose 5 % 50 mL IVPB  160 mg Intravenous TID Estanislado Emms, MD   160 mg at 03/12/15 2153  . heparin injection 5,000 Units  5,000 Units Subcutaneous 3 times per day Dionne Milo, NP   Stopped at 03/12/15 1914  . hydrALAZINE (APRESOLINE) tablet 25 mg  25 mg Oral 3 times per day Dionne Milo, NP   25 mg at 03/13/15 0522  . insulin aspart (novoLOG) injection 0-9 Units  0-9 Units Subcutaneous TID WC Ripudeep Krystal Eaton, MD   Stopped at 03/13/15 0636  . isosorbide mononitrate (IMDUR) 24 hr tablet 60 mg  60 mg Oral q morning - 10a Dionne Milo, NP   60 mg at 03/12/15 0936  . nitroGLYCERIN (NITROSTAT) SL tablet 0.4 mg  0.4 mg Sublingual Q5 min PRN Adrian Prows, MD   0.4 mg at 03/11/15 2141  . ondansetron (ZOFRAN) injection 4 mg  4 mg Intravenous Q6H PRN Dionne Milo, NP      . sodium chloride flush (NS) 0.9 % injection 3 mL  3 mL Intravenous Q12H Dionne Milo, NP   3 mL at 03/12/15 2154  . sodium chloride flush (NS) 0.9 % injection 3 mL  3 mL Intravenous PRN Dionne Milo, NP        No Known Allergies  REVIEW OF SYSTEMS:  (Positives checked otherwise negative)  CARDIOVASCULAR:   [ ]  chest pain,  [ ]  chest pressure,  [ ]  palpitations,  [x]  shortness of breath when laying flat,  [x]  shortness of breath with exertion,   [ ]  pain in feet when walking,  [ ]  pain in feet when laying flat, [ ]  history of blood clot in veins (DVT),  [ ]  history of phlebitis,  [ ]  swelling in legs,  [ ]  varicose veins  PULMONARY:   [ ]  productive cough,  [ ]  asthma,  [ ]  wheezing  NEUROLOGIC:   [ ]  weakness in arms or legs,  [ ]  numbness in arms or legs,  [ ]  difficulty speaking or slurred speech,  [ ]  temporary loss of vision in one eye,  [ ]  dizziness  HEMATOLOGIC:   [ ]  bleeding problems,  [ ]  problems with blood clotting too easily  MUSCULOSKEL:   [ ]  joint pain, [ ]  joint swelling  GASTROINTEST:   [ ]  vomiting blood,  [ ]  blood in stool     GENITOURINARY:   [ ]  burning with urination,  [ ]  blood in urine  PSYCHIATRIC:   [ ]  history of major depression  INTEGUMENTARY:   [x]  rashes,  [ ]  ulcers  CONSTITUTIONAL:   [ ]  fever,  [ ]  chills   Physical Examination  Filed Vitals:   03/12/15 0602 03/12/15 1417 03/12/15 1947 03/13/15 0507  BP:  140/82 122/60 136/79 137/81  Pulse: 67 72 69 70  Temp: 97.8 F (  36.6 C)  98.1 F (36.7 C) 98 F (36.7 C)  TempSrc: Oral  Oral Oral  Resp:  20 18 18   Height:      Weight: 200 lb (90.719 kg)   201 lb 12.8 oz (91.536 kg)  SpO2: 100% 100% 98% 98%   Body mass index is 32.59 kg/(m^2).  General: A&O x 3, WD, Obese,   Head: [place]/AT  Ear/Nose/Throat: Hearing grossly intact, nares w/o erythema or drainage, oropharynx w/o Erythema/Exudate, Mallampati score: 3  Eyes: PERRLA, EOMI  Neck: Supple, no nuchal rigidity, no palpable LAD  Pulmonary: Sym exp, good air movt, CTAB, no rales, rhonchi, & wheezing  Cardiac: RRR, Nl S1, S2, no Murmurs, rubs or gallops  Vascular: Vessel Right Left  Radial Palpable Palpable  Ulnar Faintly Palpable Faintly Palpable  Brachial Palpable Palpable  Carotid Palpable, without bruit Palpable, without bruit  Aorta Not palpable N/A  Femoral Palpable Palpable  Popliteal Not palpable Not palpable  PT Not Palpable Not Palpable  DP Not Palpable Not Palpable   Gastrointestinal: soft, NTND, -G/R, - HSM, - masses, - CVAT B  Musculoskeletal: M/S 5/5 throughout , Extremities without ischemic changes , chronic venous skin changes in feet, 1-2+ edema  Neurologic: CN 2-12 intact , Pain and light touch intact in extremities , Motor exam as listed above  Psychiatric: Judgment intact, Mood & affect appropriate for pt's clinical situation  Dermatologic: See M/S exam for extremity exam, no rashes otherwise noted  Lymph : No Cervical, Axillary, or Inguinal lymphadenopathy    Non-Invasive Vascular Imaging  Vein Mapping  (Date: 03/13/2015):   R arm: acceptable vein conduits include marginal cephalic, entire basilic  L arm: acceptable vein conduits include upper arm cephalic, upper arm basilic   Laboratory: CBC:    Component Value Date/Time   WBC 6.8 03/13/2015 0541   WBC 6.8 07/24/2014 0855   RBC 2.37* 03/13/2015 0541   RBC 2.26* 03/11/2015 1534   RBC 3.19*  07/24/2014 0855   HGB 7.0* 03/13/2015 0541   HGB 9.9* 07/24/2014 0855   HCT 20.9* 03/13/2015 0541   HCT 28.7* 07/24/2014 0855   PLT 206 03/13/2015 0541   PLT 206 07/24/2014 0855   MCV 88.2 03/13/2015 0541   MCV 90 07/24/2014 0855   MCH 29.5 03/13/2015 0541   MCH 31.0 07/24/2014 0855   MCHC 33.5 03/13/2015 0541   MCHC 34.5 07/24/2014 0855   RDW 13.7 03/13/2015 0541   RDW 12.4 07/24/2014 0855   LYMPHSABS 1.9 08/03/2014 0843   LYMPHSABS 1.5 07/24/2014 0855   MONOABS 0.6 08/03/2014 0843   EOSABS 0.1 08/03/2014 0843   EOSABS 0.3 07/24/2014 0855   BASOSABS 0.0 08/03/2014 0843   BASOSABS 0.0 07/24/2014 0855    BMP:    Component Value Date/Time   NA 136 03/12/2015 0500   K 4.3 03/12/2015 0500   CL 109 03/12/2015 0500   CO2 16* 03/12/2015 0500   GLUCOSE 90 03/12/2015 0500   BUN 85* 03/12/2015 0500   CREATININE 6.07* 03/12/2015 0500   CALCIUM 6.5* 03/12/2015 0500   GFRNONAA 9* 03/12/2015 0500   GFRAA 10* 03/12/2015 0500    Coagulation: Lab Results  Component Value Date   INR 1.43 03/13/2015   INR 1.30 06/29/2014   INR 1.32 03/07/2013   No results found for: PTT  Lipids:    Component Value Date/Time   TRIG 191* 03/10/2013 1130     Radiology: No results found.    Medical Decision Making  Trayce Kees is a 60 y.o.  male who presents with CKD V imminent ESRD requiring hemodialysis, CHF, h/o cardiac arrest   Per Renal request, will place tunneled dialysis catheter to facilitate HD. The patient is aware the risks of tunneled dialysis catheter placement include but are not limited to: bleeding, infection, central venous injury, pneumothorax, possible venous stenosis, possible malpositioning in the venous system, and possible infections related to long-term catheter presence.  The patient was aware of these risks and agreed to proceed.  Based on vein mapping and examination, this patient's permanent access options include: R RC vs BC AVF, R forearm basilic  transposition vs stage BVT.  I had an extensive discussion with this patient in regards to the nature of access surgery, including risk, benefits, and alternatives.    The patient is aware that the risks of access surgery include but are not limited to: bleeding, infection, steal syndrome, nerve damage, ischemic monomelic neuropathy, failure of access to mature, and possible need for additional access procedures in the future.  The patient is considering proceeding with the above procedure.   Adele Barthel, MD [facility] Office: [phone] Pager: [phone]  03/13/2015, 7:08 AM

## 2015-03-13 NOTE — Progress Notes (Signed)
[facility]                                                                              Patient Demographics  Eric [name], is a 60 y.o. male, DOB - Mar 22, 1955, [id number]  Admit date - 03/10/2015   Admitting Physician Waldemar Dickens, MD  Outpatient Primary MD for the patient is [name], [name], NP  LOS - 3   Chief Complaint  Patient presents with  . Chest Pain  . Shortness of Breath       Brief HPI   Eric [name] is a 60 y.o. 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 Pt presents today with complaints of chest pain and shortness of breath for several days becoming increasingly worse prompting visit to ED.   Pt states he is compliant with home meds. He denied recent fever, dizziness or palpitations, abdominal pain, n/v/d. ED evaluation consistent with acute HF exacerbation, creatinine up to 5.9 from baseline around 3, Hgb 7.3. Pt was admitted for further evaluation and treatment.   Assessment & Plan    Acute on chronic mixed systolic/diastolic CHF: -BNP 6546, 2-D echo 6/16 had shown EF of 45-50%, moderate pulmonary hypertension with underlying history of chronic kidney disease, now stage IV-V - Appreciate cardiology and nephrology recommendations - No chest pain, troponins slightly elevated, likely due to acute on chronic CHF, CKD - 2-D echo showed EF of 45-50% with hypokinesis of the inferolateral myocardium, hypokinesis of the inferior myocardium, diastolic function normal - continue imdur, coreg.  No ACEI/ARB secondary to renal failure - will need hemodialysis, vascular surgery also consulted, temporary dialysis cath placed  Acute on chronic renal failure/stage III CKD: now stage IV-V: Likely due to long-standing history of uncontrolled diabetes, hypertensive nephropathy -baseline Cr ~3,  up to 5.9  on admission, now 6.3 - Currently on aggressive IV diuresis -  nephrology and vascular surgery consulted, temporary tunneled dialysis catheter placed, awaiting further recommendations from nephrology when to initiate HD  Anemia: Normochromic normocytic - Hemoccult negative - Hemoglobin down to 7.0,anemia panel consistent with anemia of chronic disease  - Likely will need Aranesp, will defer to nephrology  Chest pain with hx CAD -No chest pain currently, troponins slightly positive due to #1 and 2  -ASA qd , cardiology following   DM2 -A1C 6.2% 06/2014 -Continue sliding scale insulin  Hx hypertension -stable -continue home meds  Hx cardiac arrest s/p ICD placement  Hx Hep C cirrhosis LFTs normal  MGUS Follows outpt with [name] -  SPEP, UPEP in process, r/o multiple myeloma given worsening renal function, anemia  Code Status: FULL CODE  Family Communication: Discussed in detail with the patient, all imaging results, lab results explained to the patient   Disposition Plan:  Time Spent in minutes 25 minutes  Procedures  2-D echo Tunneled dialysis catheter 2/18  Consults   Cardiology Nephrology Vascular surgery  DVT Prophylaxis  heparin   Medications  Scheduled Meds: . aspirin EC  81 mg Oral Daily  .  carvedilol  6.25 mg Oral BID WC  . Chlorhexidine Gluconate Cloth  6 each Topical Q0600  . cloNIDine  0.1 mg Oral TID  . darbepoetin (ARANESP) injection - NON-DIALYSIS  150 mcg Subcutaneous Q Fri-1800  . furosemide  160 mg Intravenous TID  . heparin  5,000 Units Subcutaneous 3 times per day  . hydrALAZINE  25 mg Oral 3 times per day  . insulin aspart  0-9 Units Subcutaneous TID WC  . isosorbide mononitrate  60 mg Oral q morning - 10a  . sodium chloride flush  3 mL Intravenous Q12H   Continuous Infusions:  PRN Meds:.sodium chloride, sodium chloride, acetaminophen, alteplase, benzonatate, heparin, heparin, lidocaine (PF), lidocaine-prilocaine, nitroGLYCERIN,  ondansetron (ZOFRAN) IV, pentafluoroprop-tetrafluoroeth, sodium chloride flush   Antibiotics   Anti-infectives    Start     Dose/Rate Route Frequency Ordered Stop   03/13/15 0700  cefUROXime (ZINACEF) 1.5 g in dextrose 5 % 50 mL IVPB     1.5 g 100 mL/hr over 30 Minutes Intravenous On call to O.R. 03/13/15 0026 03/13/15 0740   03/13/15 0600  cefUROXime (ZINACEF) 1.5 g in dextrose 5 % 50 mL IVPB  Status:  Discontinued     1.5 g 100 mL/hr over 30 Minutes Intravenous On call to O.R. 03/13/15 0026 03/13/15 0029        Subjective:   Eric [name] was seen and examined today. Tunneled dialysis cath placed today. Currently no complaints. Lower extremity edema persisting. No chest pain or shortness of breath. Patient denies dizziness,  abdominal pain, N/V/D/C, new weakness, numbess, tingling. No acute events overnight.    Objective:   Filed Vitals:   03/13/15 0828 03/13/15 0845 03/13/15 0900 03/13/15 0920  BP: 144/87 137/86 146/88 143/88  Pulse: 69 70 70 69  Temp: 99 F (37.2 C)  98.9 F (37.2 C)   TempSrc:      Resp: _0 Height:      Weight:      SpO2: 98% 100% 99% 99%    Intake/Output Summary (Last 24 hours) at 03/13/15 1213 Last data filed at 03/13/15 1100  Gross per 24 hour  Intake    862 ml  Output    725 ml  Net    137 ml     Wt Readings from Last 3 Encounters:  03/13/15 91.536 kg (201 lb 12.8 oz)  08/08/14 82.827 kg (182 lb 9.6 oz)  07/24/14 87.091 kg (192 lb)     Exam  General: Alert and oriented x 3, NAD  HEENT:  PERRLA, EOMI  Neck: Supple, + JVD, no masses  CVS: S1 S2 clear, regular rate and rhythm   Respiratory: Bibasilar crackles  Abdomen: Soft, nontender, distended, + bowel sounds  Ext: no cyanosis clubbing, 2+ edema  Neuro: no new deficits   Skin: No rashes  Psych: Normal affect and demeanor, alert and oriented x3    Data Review   Micro Results Recent Results (from the past 240 hour(s))  Surgical pcr screen     Status: None    Collection Time: 03/13/15  5:11 AM  Result Value Ref Range Status   MRSA, PCR NEGATIVE NEGATIVE Final   Staphylococcus aureus NEGATIVE NEGATIVE Final    Comment:        The Xpert SA Assay (FDA approved for NASAL specimens in patients over 24 years of age), is one component of a comprehensive surveillance program.  Test performance has been validated by [facility] for patients greater than or equal to  19 year old. It is not intended to diagnose infection nor to guide or monitor treatment.     Radiology Reports Dg Chest 2 View  03/10/2015  CLINICAL DATA:  Cough with shortness of breath for a few weeks. History of hypertension and diabetes. EXAM: CHEST  2 VIEW COMPARISON:  Radiographs 06/29/2014 and 08/03/2014. FINDINGS: Left subclavian AICD lead appears unchanged at the right ventricular apex. There is stable mild cardiac enlargement status post median sternotomy and CABG. Compared with the most recent study, the bilateral airspace opacities have partially cleared. There are residual opacities suspicious for edema. No significant residual pleural fluid. A small asymmetric nodular density inferiorly on the right on the frontal examination may reflect a nipple shadow. The bones appear unchanged. IMPRESSION: Recurrent congestive heart failure with pulmonary edema, less advanced than on the most recent study. Possible nipple shadow projecting over the right lung base; attention on follow-up after the edema has resolved recommended. Electronically Signed   By: Richardean Sale M.D.   On: 03/10/2015 12:51   Dg Chest Port 1 View  03/13/2015  CLINICAL DATA:  End-stage renal disease, on dialysis.  Ex-smoker. EXAM: PORTABLE CHEST 1 VIEW COMPARISON:  03/10/2015. FINDINGS: Progressive enlargement of the cardiac silhouette. Increased prominence of the pulmonary vasculature and interstitial markings. Possible small right pleural effusion. Interval right jugular double-lumen catheter with 1 lumen tip in  the inferior aspect of the superior vena cava and the other lumen tip at the superior cavoatrial junction or upper right atrium. No pneumothorax. Stable left subclavian AICD lead and median sternotomy wires. Unremarkable bones. IMPRESSION: Mild worsening of changes of congestive heart failure. Electronically Signed   By: Claudie Revering M.D.   On: 03/13/2015 09:35   Dg Fluoro Guide Cv Line-no Report  03/13/2015  CLINICAL DATA:  FLOURO GUIDE CV LINE Fluoroscopy was utilized by the requesting physician.  No radiographic interpretation.    CBC  Recent Labs Lab 03/10/15 2138 03/11/15 0127 03/12/15 0500 03/13/15 0541 03/13/15 1138  WBC 7.8 7.0 6.3 6.8 7.7  HGB 7.2* 7.0* 7.0* 7.0* 7.0*  HCT 21.9* 20.0* 21.3* 20.9* 21.7*  PLT 176 164 174 206 235  MCV 86.9 87.0 87.3 88.2 87.9  MCH 28.6 30.4 28.7 29.5 28.3  MCHC 32.9 35.0 32.9 33.5 32.3  RDW 13.3 13.5 13.6 13.7 13.5    Chemistries   Recent Labs Lab 03/10/15 1238 03/10/15 2138 03/11/15 0127 03/12/15 0500 03/13/15 0541 03/13/15 1139  NA 139  --  136 136 134* 131*  K 4.2  --  4.2 4.3 4.4 4.2  CL 108  --  107 109 104 99*  CO2 17*  --  16* 16* 14* 17*  GLUCOSE 138*  --  132* 90 75 92  BUN 83*  --  81* 85* 88* 86*  CREATININE 5.92* 6.13* 6.05* 6.07* 6.34* 6.34*  CALCIUM 6.6*  --  6.4* 6.5* 6.6* 6.5*  AST  --  24  --   --   --   --   ALT  --  35  --   --   --   --   ALKPHOS  --  61  --   --   --   --   BILITOT  --  0.4  --   --   --   --    ------------------------------------------------------------------------------------------------------------------ estimated creatinine clearance is 13 mL/min (by C-G formula based on Cr of 6.34). ------------------------------------------------------------------------------------------------------------------  Recent Labs  03/12/15 0429  HGBA1C 5.9*   ------------------------------------------------------------------------------------------------------------------ No results  for input(s):  CHOL, HDL, LDLCALC, TRIG, CHOLHDL, LDLDIRECT in the last 72 hours. ------------------------------------------------------------------------------------------------------------------ No results for input(s): TSH, T4TOTAL, T3FREE, THYROIDAB in the last 72 hours.  Invalid input(s): FREET3 ------------------------------------------------------------------------------------------------------------------  Recent Labs  03/11/15 1534  VITAMINB12 603  FOLATE 16.3  FERRITIN 122  TIBC 188*  IRON 41*  RETICCTPCT 1.6    Coagulation profile  Recent Labs Lab 03/13/15 0541  INR 1.43    No results for input(s): DDIMER in the last 72 hours.  Cardiac Enzymes  Recent Labs Lab 03/11/15 2122 03/12/15 0500 03/12/15 1002  TROPONINI 0.04* 0.05* 0.03   ------------------------------------------------------------------------------------------------------------------ Invalid input(s): POCBNP   Recent Labs  03/12/15 1200 03/12/15 1701 03/12/15 2048 03/13/15 0606 03/13/15 0832 03/13/15 1128  GLUCAP 103* 135* 123* 71 95 159*     RAI,RIPUDEEP M.D. [facility] 03/13/2015, 12:13 PM  Pager: [phone] Between 7am to 7pm - call Pager - [phone]  After 7pm go to [email or web address] - password [email or web address]  Call night coverage person covering after 7pm

## 2015-03-13 NOTE — Transfer of Care (Signed)
Immediate Anesthesia Transfer of Care Note  Patient: Eric [name]  Procedure(s) Performed: Procedure(s): INSERTION OF TUNNELED DIALYSIS CATHETER (N/A)  Patient Location: PACU  Anesthesia Type:MAC  Level of Consciousness: awake, alert , oriented and patient cooperative  Airway & Oxygen Therapy: Patient Spontanous Breathing and Patient connected to nasal cannula oxygen  Post-op Assessment: Report given to RN, Post -op Vital signs reviewed and stable, Patient moving all extremities and Patient moving all extremities X 4  Post vital signs: Reviewed and stable  Last Vitals:  Filed Vitals:   03/12/15 1947 03/13/15 0507  BP: 136/79 137/81  Pulse: 69 70  Temp: 36.7 C 36.7 C  Resp: 18 18    Complications: No apparent anesthesia complications

## 2015-03-13 NOTE — Procedures (Signed)
50 minutes into treatment he wanted to sign off due to discomfort in catheter and leg.  He agreed to stay on after discussion and offer to give Toradol. We will see if he completes treatment.  "3 hours is too long for the first treatment". Eric [name] C

## 2015-03-13 NOTE — Anesthesia Postprocedure Evaluation (Signed)
Anesthesia Post Note  Patient: Eric [name]  Procedure(s) Performed: Procedure(s) (LRB): INSERTION OF TUNNELED DIALYSIS CATHETER (N/A)  Patient location during evaluation: PACU Anesthesia Type: MAC Level of consciousness: awake Pain management: pain level controlled Vital Signs Assessment: post-procedure vital signs reviewed and stable Respiratory status: spontaneous breathing Cardiovascular status: stable Postop Assessment: no signs of nausea or vomiting Anesthetic complications: no    Last Vitals:  Filed Vitals:   03/13/15 0900 03/13/15 0920  BP: 146/88 143/88  Pulse: 70 69  Temp: 37.2 C   Resp: 17     Last Pain:  Filed Vitals:   03/13/15 0923  PainSc: 0-No pain                 Schae Cando

## 2015-03-13 NOTE — Op Note (Signed)
OPERATIVE NOTE  PROCEDURE: 1. Right internal jugular vein tunneled dialysis catheter placement 2. Right internal jugular vein cannulation under ultrasound guidance  PRE-OPERATIVE DIAGNOSIS: end-stage renal failure  POST-OPERATIVE DIAGNOSIS: same as above  SURGEON: Adele Barthel, MD  ANESTHESIA: local and MAC  ESTIMATED BLOOD LOSS: 30 cc  FINDING(S): 1.  Tips of the catheter in the right atrium on fluoroscopy 2.  No obvious pneumothorax on fluoroscopy  SPECIMEN(S):  none  INDICATIONS:   Eric [name] is a 60 y.o. male who presents with end stage renal disease.  The patient presents for tunneled dialysis catheter placement.  The patient is aware the risks of tunneled dialysis catheter placement include but are not limited to: bleeding, infection, central venous injury, pneumothorax, possible venous stenosis, possible malpositioning in the venous system, and possible infections related to long-term catheter presence.  The patient was aware of these risks and agreed to proceed.  DESCRIPTION: After written full informed consent was obtained from the patient, the patient was taken back to the operating room.  Prior to induction, the patient was given IV antibiotics.  After obtaining adequate sedation, the patient was prepped and draped in the standard fashion for a chest or neck tunneled dialysis catheter placement.   The cannulation site, the catheter exit site, and tract for the subcutaneous tunnel were then anesthestized with a total of 25 cc of 1% lidocaine without epinephrine.  Under ultrasound guidance, the right internal jugular vein was cannulated with the 18 gauge needle.  A J-wire was then placed down into the inferior vena cava under fluoroscopic guidance.  The wire was then secured in place with a clamp to the drapes.  I then made stab incisions at the neck and exit sites.   I dissected from the exit site to the cannulation site with a tunneler.   The subcutaneous tunnel was  dilated by passing a plastic dilator over the metal dissector. The wire was then unclamped and I removed the needle.  The skin tract and venotomy was dilated serially with dilators.  Finally, the dilator-sheath was placed under fluoroscopic guidance into the superior vena cava.  The dilator and wire were removed.  A 23 cm Diatek catheter was placed under fluoroscopic guidance down into the right atrium.  The sheath was broken and peeled away while holding the catheter cuff at the level of the skin.  The back end of this catheter was transected, and docked onto the tunneler.  The distal catheter was delivered through the subcutaneous tunnel.  The catheter was transected a second time, revealing the two lumens of this catheter.  The ports were docked onto these two lumens.  The catheter collar was then screwedinto place.  Each port was tested by aspirating and flushing.  No resistance was noted.  Each port was then thoroughly flushed with heparinized saline.  The catheter was secured in placed with two interrupted stitches of 3-0 Nylon tied to the catheter.  The neck incision was closed with a U-stitch of 4-0 Monocryl.  The neck and chest incision were cleaned and sterile bandages applied.  Each port was then loaded with concentrated heparin (1000 Units/mL) at the manufacturer recommended volumes to each port.  Sterile caps were applied to each port.    On completion fluoroscopy, the tips of the catheter were in the right atrium, and there was no evidence of pneumothorax.   COMPLICATIONS: none  CONDITION: stable   Adele Barthel, MD [facility] Office: [phone] Pager:  [phone]  03/13/2015, 8:18 AM

## 2015-03-13 NOTE — H&P (View-Only) (Signed)
Referred by:  Dr. Florene Glen  Reason for referral: New access  History of Present Illness  Eric [name] is a 60 y.o. (12-12-1955) male who presents for evaluation for permanent access.  The patient is right hand dominant.  The patient has not had previous access procedures.  Previous central venous cannulation procedures include: multiple CVL.  The patient has had a L AICD placed.   Past Medical History  Diagnosis Date  . Hypertension   . Hyperlipemia   . Edema   . 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 Obesity   . Ventricular tachycardia ([place])   . AICD (automatic cardioverter/defibrillator) present   . CHF (congestive heart failure) ([place])   . Myocardial infarction ([place]) 03/2013  . Pneumonia 2016  . History of blood transfusion 1950's    "related to pinal menigitis; HAD 16 OPERATIONS TOTAL"  . Scabies infestation 06/29/2014  . Shortness of breath dyspnea   . Type II diabetes mellitus ([place])     type 2  . CKD (chronic kidney disease), stage III     Past Surgical History  Procedure Laterality Date  . Implantable cardioverter defibrillator implant  03/24/2013    STJ single chamber ICD implanted by Dr Caryl Comes for secondary prevention  . Left heart catheterization with coronary/graft angiogram  03/07/2013    Procedure: LEFT HEART CATHETERIZATION WITH [name];  Surgeon: Clent Demark, MD;  Location: [facility] CATH LAB;  Service: Cardiovascular;;  . Implantable cardioverter defibrillator implant N/A 03/24/2013    Procedure: IMPLANTABLE CARDIOVERTER DEFIBRILLATOR IMPLANT;  Surgeon: Deboraha Sprang, MD;  Location: [facility] CATH LAB;  Service: Cardiovascular;  Laterality: N/A;  . Cardiac catheterization    . Head surgery  1950'S    "for spinal menigitis; HAD 16 OPERATIONS TOTAL"  . Back surgery  1950's    "for spinal menigitis; HAD 16 OPERATIONS TOTAL"  . 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 Coronary artery bypass graft   1999    cabg x4    Social History   Social History  . Marital Status: Married    Spouse Name: N/A  . Number of Children: N/A  . Years of Education: N/A   Occupational History  . Not on file.   Social History Main Topics  . Smoking status: Former Smoker -- 1.00 packs/day for 5 years    Types: Cigarettes  . Smokeless tobacco: Never Used     Comment: "quit smoking in the 1960's"  . Alcohol Use: No  . Drug Use: No  . Sexual Activity: Not Currently   Other Topics Concern  . Not on file   Social History Narrative    Family History  Problem Relation Age of Onset  . Heart disease Mother   . Diabetes Mother     Current Facility-Administered Medications  Medication Dose Route Frequency Provider Last Rate Last Dose  . acetaminophen (TYLENOL) tablet 650 mg  650 mg Oral Q4H PRN Dionne Milo, NP      . aspirin EC tablet 81 mg  81 mg Oral Daily Dionne Milo, NP   81 mg at 03/12/15 [id number]  . benzonatate (TESSALON) capsule 100 mg  100 mg Oral TID PRN Ritta Slot, NP   100 mg at 03/12/15 2201  . carvedilol (COREG) tablet 6.25 mg  6.25 mg Oral BID WC Dionne Milo, NP   6.25 mg at 03/12/15 1657  . cefUROXime (ZINACEF) 1.5 g  in dextrose 5 % 50 mL IVPB  1.5 g Intravenous On Call to [place], PA-C      . Chlorhexidine Gluconate Cloth 2 % PADS 6 each  6 each Topical Q0600 Ripudeep Krystal Eaton, MD   6 each at 03/13/15 0549  . cloNIDine (CATAPRES) tablet 0.1 mg  0.1 mg Oral TID Dionne Milo, NP   0.1 mg at 03/12/15 2154  . Darbepoetin Alfa (ARANESP) injection 150 mcg  150 mcg Subcutaneous Q Fri-1800 Edrick Oh, MD   150 mcg at 03/12/15 1802  . furosemide (LASIX) 160 mg in dextrose 5 % 50 mL IVPB  160 mg Intravenous TID Estanislado Emms, MD   160 mg at 03/12/15 2153  . heparin injection 5,000 Units  5,000 Units Subcutaneous 3 times per day Dionne Milo, NP   Stopped at 03/12/15 1914  . hydrALAZINE (APRESOLINE) tablet 25 mg  25 mg Oral 3 times per day Dionne Milo, NP   25 mg at 03/13/15 0522  . insulin aspart (novoLOG) injection 0-9 Units  0-9 Units Subcutaneous TID WC Ripudeep Krystal Eaton, MD   Stopped at 03/13/15 0636  . isosorbide mononitrate (IMDUR) 24 hr tablet 60 mg  60 mg Oral q morning - 10a Dionne Milo, NP   60 mg at 03/12/15 0936  . nitroGLYCERIN (NITROSTAT) SL tablet 0.4 mg  0.4 mg Sublingual Q5 min PRN Adrian Prows, MD   0.4 mg at 03/11/15 2141  . ondansetron (ZOFRAN) injection 4 mg  4 mg Intravenous Q6H PRN Dionne Milo, NP      . sodium chloride flush (NS) 0.9 % injection 3 mL  3 mL Intravenous Q12H Dionne Milo, NP   3 mL at 03/12/15 2154  . sodium chloride flush (NS) 0.9 % injection 3 mL  3 mL Intravenous PRN Dionne Milo, NP        No Known Allergies  REVIEW OF SYSTEMS:  (Positives checked otherwise negative)  CARDIOVASCULAR:   [ ]  chest pain,  [ ]  chest pressure,  [ ]  palpitations,  [x]  shortness of breath when laying flat,  [x]  shortness of breath with exertion,   [ ]  pain in feet when walking,  [ ]  pain in feet when laying flat, [ ]  history of blood clot in veins (DVT),  [ ]  history of phlebitis,  [ ]  swelling in legs,  [ ]  varicose veins  PULMONARY:   [ ]  productive cough,  [ ]  asthma,  [ ]  wheezing  NEUROLOGIC:   [ ]  weakness in arms or legs,  [ ]  numbness in arms or legs,  [ ]  difficulty speaking or slurred speech,  [ ]  temporary loss of vision in one eye,  [ ]  dizziness  HEMATOLOGIC:   [ ]  bleeding problems,  [ ]  problems with blood clotting too easily  MUSCULOSKEL:   [ ]  joint pain, [ ]  joint swelling  GASTROINTEST:   [ ]  vomiting blood,  [ ]  blood in stool     GENITOURINARY:   [ ]  burning with urination,  [ ]  blood in urine  PSYCHIATRIC:   [ ]  history of major depression  INTEGUMENTARY:   [x]  rashes,  [ ]  ulcers  CONSTITUTIONAL:   [ ]  fever,  [ ]  chills   Physical Examination  Filed Vitals:   03/12/15 0602 03/12/15 1417 03/12/15 1947 03/13/15 0507  BP:  140/82 122/60 136/79 137/81  Pulse: 67 72 69 70  Temp: 97.8 F (  36.6 C)  98.1 F (36.7 C) 98 F (36.7 C)  TempSrc: Oral  Oral Oral  Resp:  20 18 18   Height:      Weight: 200 lb (90.719 kg)   201 lb 12.8 oz (91.536 kg)  SpO2: 100% 100% 98% 98%   Body mass index is 32.59 kg/(m^2).  General: A&O x 3, WD, Obese,   Head: [place]/AT  Ear/Nose/Throat: Hearing grossly intact, nares w/o erythema or drainage, oropharynx w/o Erythema/Exudate, Mallampati score: 3  Eyes: PERRLA, EOMI  Neck: Supple, no nuchal rigidity, no palpable LAD  Pulmonary: Sym exp, good air movt, CTAB, no rales, rhonchi, & wheezing  Cardiac: RRR, Nl S1, S2, no Murmurs, rubs or gallops  Vascular: Vessel Right Left  Radial Palpable Palpable  Ulnar Faintly Palpable Faintly Palpable  Brachial Palpable Palpable  Carotid Palpable, without bruit Palpable, without bruit  Aorta Not palpable N/A  Femoral Palpable Palpable  Popliteal Not palpable Not palpable  PT Not Palpable Not Palpable  DP Not Palpable Not Palpable   Gastrointestinal: soft, NTND, -G/R, - HSM, - masses, - CVAT B  Musculoskeletal: M/S 5/5 throughout , Extremities without ischemic changes , chronic venous skin changes in feet, 1-2+ edema  Neurologic: CN 2-12 intact , Pain and light touch intact in extremities , Motor exam as listed above  Psychiatric: Judgment intact, Mood & affect appropriate for pt's clinical situation  Dermatologic: See M/S exam for extremity exam, no rashes otherwise noted  Lymph : No Cervical, Axillary, or Inguinal lymphadenopathy    Non-Invasive Vascular Imaging  Vein Mapping  (Date: 03/13/2015):   R arm: acceptable vein conduits include marginal cephalic, entire basilic  L arm: acceptable vein conduits include upper arm cephalic, upper arm basilic   Laboratory: CBC:    Component Value Date/Time   WBC 6.8 03/13/2015 0541   WBC 6.8 07/24/2014 0855   RBC 2.37* 03/13/2015 0541   RBC 2.26* 03/11/2015 1534   RBC 3.19*  07/24/2014 0855   HGB 7.0* 03/13/2015 0541   HGB 9.9* 07/24/2014 0855   HCT 20.9* 03/13/2015 0541   HCT 28.7* 07/24/2014 0855   PLT 206 03/13/2015 0541   PLT 206 07/24/2014 0855   MCV 88.2 03/13/2015 0541   MCV 90 07/24/2014 0855   MCH 29.5 03/13/2015 0541   MCH 31.0 07/24/2014 0855   MCHC 33.5 03/13/2015 0541   MCHC 34.5 07/24/2014 0855   RDW 13.7 03/13/2015 0541   RDW 12.4 07/24/2014 0855   LYMPHSABS 1.9 08/03/2014 0843   LYMPHSABS 1.5 07/24/2014 0855   MONOABS 0.6 08/03/2014 0843   EOSABS 0.1 08/03/2014 0843   EOSABS 0.3 07/24/2014 0855   BASOSABS 0.0 08/03/2014 0843   BASOSABS 0.0 07/24/2014 0855    BMP:    Component Value Date/Time   NA 136 03/12/2015 0500   K 4.3 03/12/2015 0500   CL 109 03/12/2015 0500   CO2 16* 03/12/2015 0500   GLUCOSE 90 03/12/2015 0500   BUN 85* 03/12/2015 0500   CREATININE 6.07* 03/12/2015 0500   CALCIUM 6.5* 03/12/2015 0500   GFRNONAA 9* 03/12/2015 0500   GFRAA 10* 03/12/2015 0500    Coagulation: Lab Results  Component Value Date   INR 1.43 03/13/2015   INR 1.30 06/29/2014   INR 1.32 03/07/2013   No results found for: PTT  Lipids:    Component Value Date/Time   TRIG 191* 03/10/2013 1130     Radiology: No results found.    Medical Decision Making  Eric [name] is a 60 y.o.  male who presents with CKD V imminent ESRD requiring hemodialysis, CHF, h/o cardiac arrest   Per Renal request, will place tunneled dialysis catheter to facilitate HD. The patient is aware the risks of tunneled dialysis catheter placement include but are not limited to: bleeding, infection, central venous injury, pneumothorax, possible venous stenosis, possible malpositioning in the venous system, and possible infections related to long-term catheter presence.  The patient was aware of these risks and agreed to proceed.  Based on vein mapping and examination, this patient's permanent access options include: R RC vs BC AVF, R forearm basilic  transposition vs stage BVT.  I had an extensive discussion with this patient in regards to the nature of access surgery, including risk, benefits, and alternatives.    The patient is aware that the risks of access surgery include but are not limited to: bleeding, infection, steal syndrome, nerve damage, ischemic monomelic neuropathy, failure of access to mature, and possible need for additional access procedures in the future.  The patient is considering proceeding with the above procedure.   Adele Barthel, MD [facility] Office: [phone] Pager: [phone]  03/13/2015, 7:08 AM

## 2015-03-13 NOTE — Progress Notes (Signed)
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   Hypertension Suboptimal  Anemia will start ESA - iron stores adequate Plan: Dialyze today, will need AV access next week and begin CLIP  Subjective: Interval History: s/p right neck PC  Objective: Vital signs in last 24 hours: Temp:  [98 F (36.7 C)-99 F (37.2 C)] 98.9 F (37.2 C) (02/18 0900) Pulse Rate:  [69-72] 69 (02/18 0920) Resp:  [17-20] 17 (02/18 0900) BP: (122-146)/(60-88) 143/88 mmHg (02/18 0920) SpO2:  [98 %-100 %] 99 % (02/18 0920) Weight:  [91.536 kg (201 lb 12.8 oz)] 91.536 kg (201 lb 12.8 oz) (02/18 0507) Weight change: 0.816 kg (1 lb 12.8 oz)  Intake/Output from previous day: 02/17 0701 - 02/18 0700 In: 955 [P.O.:820; I.V.:3; IV Piggyback:132] Out: 700 [Urine:700] Intake/Output this shift: Total I/O In: 490 [P.O.:240; I.V.:250] Out: 25 [Blood:25]  General appearance: alert and cooperative Resp: clear to auscultation bilaterally Chest wall: tender right PC post op Extremities: edema 2-3+ bilat  Lab Results:  Recent Labs  03/13/15 0541 03/13/15 1138  WBC 6.8 7.7  HGB 7.0* 7.0*  HCT 20.9* 21.7*  PLT 206 235   BMET:  Recent Labs  03/13/15 0541 03/13/15 1139  NA 134* 131*  K 4.4 4.2  CL 104 99*  CO2 14* 17*  GLUCOSE 75 92  BUN 88* 86*  CREATININE 6.34* 6.34*  CALCIUM 6.6* 6.5*   No results for input(s): PTH in the last 72 hours. Iron Studies:  Recent Labs  03/11/15 1534  IRON 41*  TIBC 188*  FERRITIN 122   Studies/Results: Dg Chest Port 1 View  03/13/2015  CLINICAL DATA:  End-stage renal disease, on dialysis.  Ex-smoker. EXAM: PORTABLE CHEST 1 VIEW COMPARISON:  03/10/2015. FINDINGS: Progressive enlargement of the cardiac silhouette. Increased prominence of the pulmonary vasculature and interstitial markings. Possible small right pleural effusion. Interval right jugular double-lumen catheter with 1 lumen tip in the inferior aspect of the  superior vena cava and the other lumen tip at the superior cavoatrial junction or upper right atrium. No pneumothorax. Stable left subclavian AICD lead and median sternotomy wires. Unremarkable bones. IMPRESSION: Mild worsening of changes of congestive heart failure. Electronically Signed   By: Claudie Revering M.D.   On: 03/13/2015 09:35   Dg Fluoro Guide Cv Line-no Report  03/13/2015  CLINICAL DATA:  FLOURO GUIDE CV LINE Fluoroscopy was utilized by the requesting physician.  No radiographic interpretation.    Scheduled: . aspirin EC  81 mg Oral Daily  . carvedilol  6.25 mg Oral BID WC  . Chlorhexidine Gluconate Cloth  6 each Topical Q0600  . cloNIDine  0.1 mg Oral TID  . darbepoetin (ARANESP) injection - NON-DIALYSIS  150 mcg Subcutaneous Q Fri-1800  . furosemide  160 mg Intravenous TID  . heparin  5,000 Units Subcutaneous 3 times per day  . hydrALAZINE  25 mg Oral 3 times per day  . insulin aspart  0-9 Units Subcutaneous TID WC  . isosorbide mononitrate  60 mg Oral q morning - 10a  . sodium chloride flush  3 mL Intravenous Q12H     LOS: 3 days   Franklin [place] C 03/13/2015,12:13 PM

## 2015-03-13 NOTE — Anesthesia Preprocedure Evaluation (Addendum)
Anesthesia Evaluation  Patient identified by MRN, date of birth, ID band Patient awake    Reviewed: Allergy & Precautions, NPO status , Patient's Chart, lab work & pertinent test results, reviewed documented beta blocker date and time   History of Anesthesia Complications Negative for: history of anesthetic complications  Airway Mallampati: III  TM Distance: >3 FB Neck ROM: Full    Dental  (+) Teeth Intact, Poor Dentition   Pulmonary shortness of breath, neg sleep apnea, neg recent URI, former smoker, neg PE   breath sounds clear to auscultation       Cardiovascular hypertension, Pt. on medications + CAD, + Past MI and +CHF  + Cardiac Defibrillator  Rhythm:Regular     Neuro/Psych negative neurological ROS  negative psych ROS   GI/Hepatic negative GI ROS, (+) Hepatitis -, C  Endo/Other  diabetes, Type 2, Oral Hypoglycemic AgentsMorbid obesity  Renal/GU CRFRenal disease     Musculoskeletal negative musculoskeletal ROS (+)   Abdominal   Peds  Hematology  (+) anemia ,   Anesthesia Other Findings   Reproductive/Obstetrics                            Anesthesia Physical Anesthesia Plan  ASA: III  Anesthesia Plan: MAC   Post-op Pain Management:    Induction: Intravenous  Airway Management Planned: Nasal Cannula, Natural Airway and Simple Face Mask  Additional Equipment: None  Intra-op Plan:   Post-operative Plan:   Informed Consent: I have reviewed the patients History and Physical, chart, labs and discussed the procedure including the risks, benefits and alternatives for the proposed anesthesia with the patient or authorized representative who has indicated his/her understanding and acceptance.   Dental advisory given  Plan Discussed with: CRNA and Surgeon  Anesthesia Plan Comments:         Anesthesia Quick Evaluation

## 2015-03-13 NOTE — Interval H&P Note (Signed)
History and Physical Interval Note:  03/13/2015 7:15 AM  Eric [name]  has presented today for surgery, with the diagnosis of End Stage Renal Disease  N18.6  The various methods of treatment have been discussed with the patient and family. After consideration of risks, benefits and other options for treatment, the patient has consented to  Procedure(s): INSERTION OF TUNNELED DIALYSIS CATHETER (N/A) as a surgical intervention .  The patient's history has been reviewed, patient examined, no change in status, stable for surgery.  I have reviewed the patient's chart and labs.  Questions were answered to the patient's satisfaction.     Adele Barthel

## 2015-03-14 LAB — CBC
HEMATOCRIT: 19.1 % — AB (ref 39.0–52.0)
HEMOGLOBIN: 6.3 g/dL — AB (ref 13.0–17.0)
MCH: 28.8 pg (ref 26.0–34.0)
MCHC: 33 g/dL (ref 30.0–36.0)
MCV: 87.2 fL (ref 78.0–100.0)
Platelets: 209 10*3/uL (ref 150–400)
RBC: 2.19 MIL/uL — AB (ref 4.22–5.81)
RDW: 13.4 % (ref 11.5–15.5)
WBC: 5.5 10*3/uL (ref 4.0–10.5)

## 2015-03-14 LAB — ABO/RH: ABO/RH(D): B POS

## 2015-03-14 LAB — GLUCOSE, CAPILLARY
GLUCOSE-CAPILLARY: 116 mg/dL — AB (ref 65–99)
Glucose-Capillary: 99 mg/dL (ref 65–99)
Glucose-Capillary: 172 mg/dL — ABNORMAL HIGH (ref 65–99)

## 2015-03-14 LAB — PREPARE RBC (CROSSMATCH)

## 2015-03-14 MED ORDER — METHOCARBAMOL 500 MG PO TABS: 500.0000 mg | ORAL_TABLET | Freq: Two times a day (BID) | ORAL | Status: DC | PRN

## 2015-03-14 MED ORDER — TRAMADOL HCL 50 MG PO TABS
50.0000 mg | ORAL_TABLET | Freq: Two times a day (BID) | ORAL | Status: DC | PRN
  Administered 2015-03-15 – 2015-03-20 (×5): 50 mg via ORAL
  Filled 2015-03-14 (×3): qty 1

## 2015-03-14 NOTE — Progress Notes (Signed)
[facility]                                                                              Patient Demographics  Eric [name], is a 60 y.o. male, DOB - 1955-06-11, VVZ:[id number]  Admit date - 03/10/2015   Admitting Physician Waldemar Dickens, MD  Outpatient Primary MD for the patient is [name], [name], NP  LOS - 4   Chief Complaint  Patient presents with  . Chest Pain  . Shortness of Breath       Brief HPI   Eric [name] is a 60 y.o. 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 Pt presents today with complaints of chest pain and shortness of breath for several days becoming increasingly worse prompting visit to ED.   Pt states he is compliant with home meds. He denied recent fever, dizziness or palpitations, abdominal pain, n/v/d. ED evaluation consistent with acute HF exacerbation, creatinine up to 5.9 from baseline around 3, Hgb 7.3. Pt was admitted for further evaluation and treatment.   Assessment & Plan    Acute on chronic mixed systolic/diastolic CHF: -BNP 5449, 2-D echo 6/16 had shown EF of 45-50%, moderate pulmonary hypertension with underlying history of chronic kidney disease, now stage IV-V - Appreciate cardiology and nephrology recommendations - No chest pain, troponins slightly elevated, likely due to acute on chronic CHF, CKD - 2-D echo showed EF of 45-50% with hypokinesis of the inferolateral myocardium, hypokinesis of the inferior myocardium, diastolic function normal - continue imdur, coreg.  No ACEI/ARB secondary to renal failure - temporary dialysis cath placed, HD started  Acute on chronic renal failure/stage III CKD: now stage IV-V: Likely due to long-standing history of uncontrolled diabetes, hypertensive nephropathy -baseline Cr ~3,  up to 5.9 on admission, no significant improvement with  IV diuresis -  nephrology and vascular surgery consulted, temporary tunneled dialysis catheter placed, HD started, first dialysis on 2/18 - Per patient he was not able to tolerate it due to pain in legs, will place on tramadol and Robaxin prior to HD.   Anemia: Normochromic normocytic - Hemoccult negative - Hemoglobin down to 7.0,anemia panel consistent with anemia of chronic disease  - Started on Aranesp, plan for blood transfusion with next HD  Chest pain with hx CAD -No chest pain currently, troponins slightly positive due to #1 and 2  -ASA qd , cardiology following   DM2 -A1C 6.2% 06/2014 -Continue sliding scale insulin  Hx hypertension -stable -continue home meds  Hx cardiac arrest s/p ICD placement  Hx Hep C cirrhosis LFTs normal  MGUS Follows outpt with [name] -  SPEP, UPEP in process, r/o multiple myeloma given worsening renal function, anemia  Code Status: FULL CODE  Family Communication: Discussed in detail with the patient, all imaging results, lab results explained to the patient   Disposition Plan:  Time Spent in minutes 25 minutes  Procedures  2-D echo Tunneled dialysis catheter 2/18  Consults   Cardiology Nephrology Vascular surgery  DVT Prophylaxis  heparin   Medications  Scheduled Meds: . aspirin EC  81 mg Oral Daily  . carvedilol  6.25 mg Oral BID WC  . Chlorhexidine Gluconate Cloth  6 each Topical Q0600  . cloNIDine  0.1 mg Oral TID  . darbepoetin (ARANESP) injection - NON-DIALYSIS  150 mcg Subcutaneous Q Fri-1800  . heparin  5,000 Units Subcutaneous 3 times per day  . hydrALAZINE  25 mg Oral 3 times per day  . insulin aspart  0-9 Units Subcutaneous TID WC  . isosorbide mononitrate  60 mg Oral q morning - 10a  . sodium chloride flush  3 mL Intravenous Q12H   Continuous Infusions:  PRN Meds:.sodium chloride, sodium chloride, acetaminophen, alteplase, benzonatate, heparin, heparin, heparin, lidocaine (PF), lidocaine-prilocaine,  nitroGLYCERIN, ondansetron (ZOFRAN) IV, pentafluoroprop-tetrafluoroeth, sodium chloride flush   Antibiotics   Anti-infectives    Start     Dose/Rate Route Frequency Ordered Stop   03/13/15 0700  cefUROXime (ZINACEF) 1.5 g in dextrose 5 % 50 mL IVPB     1.5 g 100 mL/hr over 30 Minutes Intravenous On call to O.R. 03/13/15 0026 03/13/15 0740   03/13/15 0600  cefUROXime (ZINACEF) 1.5 g in dextrose 5 % 50 mL IVPB  Status:  Discontinued     1.5 g 100 mL/hr over 30 Minutes Intravenous On call to O.R. 03/13/15 0026 03/13/15 0029        Subjective:   Eric [name] was seen and examined today. No complaints today although stated that he was not able to tolerate HD due to pain in the legs. Lower extremity edema persisting. No chest pain or shortness of breath. Patient denies dizziness,  abdominal pain, N/V/D/C, new weakness, numbess, tingling. No acute events overnight.    Objective:   Filed Vitals:   03/13/15 2230 03/13/15 2300 03/13/15 2314 03/14/15 0530  BP: 130/70 124/70 140/70 144/83  Pulse: 74 78 70 74  Temp:   98.2 F (36.8 C) 97.5 F (36.4 C)  TempSrc:   Oral Oral  Resp: _0 Height:      Weight:   90 kg (198 lb 6.6 oz) 89.132 kg (196 lb 8 oz)  SpO2:   98% 96%    Intake/Output Summary (Last 24 hours) at 03/14/15 1309 Last data filed at 03/14/15 1020  Gross per 24 hour  Intake    870 ml  Output   3000 ml  Net  -2130 ml     Wt Readings from Last 3 Encounters:  03/14/15 89.132 kg (196 lb 8 oz)  08/08/14 82.827 kg (182 lb 9.6 oz)  07/24/14 87.091 kg (192 lb)     Exam  General: Alert and oriented x 3, NAD  HEENT:   Neck: Supple, + JVD  CVS: S1 S2 clear, regular rate and rhythm   Respiratory: Bibasilar crackles  Abdomen: Soft, nontender, distended, + bowel sounds  Ext: no cyanosis clubbing, 2+ edema  Neuro: no new deficits   Skin: No rashes  Psych: Normal affect and demeanor, alert and oriented x3    Data Review   Micro Results Recent  Results (from the past 240 hour(s))  Surgical pcr screen     Status: None   Collection Time: 03/13/15  5:11 AM  Result Value Ref Range Status   MRSA, PCR NEGATIVE NEGATIVE Final   Staphylococcus aureus NEGATIVE NEGATIVE Final    Comment:        The Xpert SA Assay (FDA approved for NASAL specimens in patients over 65 years of age),  is one component of a comprehensive surveillance program.  Test performance has been validated by [facility] for patients greater than or equal to 33 year old. It is not intended to diagnose infection nor to guide or monitor treatment.     Radiology Reports Dg Chest 2 View  03/10/2015  CLINICAL DATA:  Cough with shortness of breath for a few weeks. History of hypertension and diabetes. EXAM: CHEST  2 VIEW COMPARISON:  Radiographs 06/29/2014 and 08/03/2014. FINDINGS: Left subclavian AICD lead appears unchanged at the right ventricular apex. There is stable mild cardiac enlargement status post median sternotomy and CABG. Compared with the most recent study, the bilateral airspace opacities have partially cleared. There are residual opacities suspicious for edema. No significant residual pleural fluid. A small asymmetric nodular density inferiorly on the right on the frontal examination may reflect a nipple shadow. The bones appear unchanged. IMPRESSION: Recurrent congestive heart failure with pulmonary edema, less advanced than on the most recent study. Possible nipple shadow projecting over the right lung base; attention on follow-up after the edema has resolved recommended. Electronically Signed   By: Richardean Sale M.D.   On: 03/10/2015 12:51   Dg Chest Port 1 View  03/13/2015  CLINICAL DATA:  End-stage renal disease, on dialysis.  Ex-smoker. EXAM: PORTABLE CHEST 1 VIEW COMPARISON:  03/10/2015. FINDINGS: Progressive enlargement of the cardiac silhouette. Increased prominence of the pulmonary vasculature and interstitial markings. Possible small right pleural  effusion. Interval right jugular double-lumen catheter with 1 lumen tip in the inferior aspect of the superior vena cava and the other lumen tip at the superior cavoatrial junction or upper right atrium. No pneumothorax. Stable left subclavian AICD lead and median sternotomy wires. Unremarkable bones. IMPRESSION: Mild worsening of changes of congestive heart failure. Electronically Signed   By: Claudie Revering M.D.   On: 03/13/2015 09:35   Dg Fluoro Guide Cv Line-no Report  03/13/2015  CLINICAL DATA:  FLOURO GUIDE CV LINE Fluoroscopy was utilized by the requesting physician.  No radiographic interpretation.    CBC  Recent Labs Lab 03/11/15 0127 03/12/15 0500 03/13/15 0541 03/13/15 1138 03/14/15 0313  WBC 7.0 6.3 6.8 7.7 5.5  HGB 7.0* 7.0* 7.0* 7.0* 6.3*  HCT 20.0* 21.3* 20.9* 21.7* 19.1*  PLT 164 174 206 235 209  MCV 87.0 87.3 88.2 87.9 87.2  MCH 30.4 28.7 29.5 28.3 28.8  MCHC 35.0 32.9 33.5 32.3 33.0  RDW 13.5 13.6 13.7 13.5 13.4    Chemistries   Recent Labs Lab 03/10/15 1238 03/10/15 2138 03/11/15 0127 03/12/15 0500 03/13/15 0541 03/13/15 1139  NA 139  --  136 136 134* 131*  K 4.2  --  4.2 4.3 4.4 4.2  CL 108  --  107 109 104 99*  CO2 17*  --  16* 16* 14* 17*  GLUCOSE 138*  --  132* 90 75 92  BUN 83*  --  81* 85* 88* 86*  CREATININE 5.92* 6.13* 6.05* 6.07* 6.34* 6.34*  CALCIUM 6.6*  --  6.4* 6.5* 6.6* 6.5*  AST  --  24  --   --   --   --   ALT  --  35  --   --   --   --   ALKPHOS  --  61  --   --   --   --   BILITOT  --  0.4  --   --   --   --    ------------------------------------------------------------------------------------------------------------------ estimated creatinine clearance is 12.8  mL/min (by C-G formula based on Cr of 6.34). ------------------------------------------------------------------------------------------------------------------  Recent Labs  03/12/15 0429  HGBA1C 5.9*    ------------------------------------------------------------------------------------------------------------------ No results for input(s): CHOL, HDL, LDLCALC, TRIG, CHOLHDL, LDLDIRECT in the last 72 hours. ------------------------------------------------------------------------------------------------------------------ No results for input(s): TSH, T4TOTAL, T3FREE, THYROIDAB in the last 72 hours.  Invalid input(s): FREET3 ------------------------------------------------------------------------------------------------------------------  Recent Labs  03/11/15 1534  VITAMINB12 603  FOLATE 16.3  FERRITIN 122  TIBC 188*  IRON 41*  RETICCTPCT 1.6    Coagulation profile  Recent Labs Lab 03/13/15 0541  INR 1.43    No results for input(s): DDIMER in the last 72 hours.  Cardiac Enzymes  Recent Labs Lab 03/11/15 2122 03/12/15 0500 03/12/15 1002  TROPONINI 0.04* 0.05* 0.03   ------------------------------------------------------------------------------------------------------------------ Invalid input(s): POCBNP   Recent Labs  03/12/15 2048 03/13/15 0606 03/13/15 0832 03/13/15 1128 03/13/15 1624 03/14/15 1137  GLUCAP 123* 71 95 159* 139* 99     , M.D. [facility] 03/14/2015, 1:09 PM  Pager: [phone] Between 7am to 7pm - call Pager - [phone]  After 7pm go to [email or web address] - password [email or web address]  Call night coverage person covering after 7pm

## 2015-03-14 NOTE — Progress Notes (Signed)
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   Hypertension Suboptimal  Anemia, variation due to cath placement Plan: Dialyze again in AM, will need AV access next week and begin CLIP           Transfuse blood during dialysis Mon  Subjective: Interval History: Some discomfort with dialysis Objective: Vital signs in last 24 hours: Temp:  [97.5 F (36.4 C)-98.2 F (36.8 C)] 97.5 F (36.4 C) (02/19 0530) Pulse Rate:  [70-78] 74 (02/19 0530) Resp:  [14-20] 18 (02/19 0530) BP: (100-144)/(64-83) 144/83 mmHg (02/19 0530) SpO2:  [96 %-98 %] 96 % (02/19 0530) Weight:  [89.132 kg (196 lb 8 oz)-93 kg (205 lb 0.4 oz)] 89.132 kg (196 lb 8 oz) (02/19 0530) Weight change: 1.464 kg (3 lb 3.6 oz)  Intake/Output from previous day: 02/18 0701 - 02/19 0700 In: 970 [P.O.:720; I.V.:250] Out: 3025 [Blood:25] Intake/Output this shift: Total I/O In: 390 [P.O.:390] Out: -   General appearance: alert and cooperative Resp: clear to auscultation bilaterally Extremities: edema 3+  Lab Results:  Recent Labs  03/13/15 1138 03/14/15 0313  WBC 7.7 5.5  HGB 7.0* 6.3*  HCT 21.7* 19.1*  PLT 235 209   BMET:  Recent Labs  03/13/15 0541 03/13/15 1139  NA 134* 131*  K 4.4 4.2  CL 104 99*  CO2 14* 17*  GLUCOSE 75 92  BUN 88* 86*  CREATININE 6.34* 6.34*  CALCIUM 6.6* 6.5*   No results for input(s): PTH in the last 72 hours. Iron Studies:  Recent Labs  03/11/15 1534  IRON 41*  TIBC 188*  FERRITIN 122   Studies/Results: Dg Chest Port 1 View  03/13/2015  CLINICAL DATA:  End-stage renal disease, on dialysis.  Ex-smoker. EXAM: PORTABLE CHEST 1 VIEW COMPARISON:  03/10/2015. FINDINGS: Progressive enlargement of the cardiac silhouette. Increased prominence of the pulmonary vasculature and interstitial markings. Possible small right pleural effusion. Interval right jugular double-lumen catheter with 1 lumen tip in the inferior aspect of the superior vena  cava and the other lumen tip at the superior cavoatrial junction or upper right atrium. No pneumothorax. Stable left subclavian AICD lead and median sternotomy wires. Unremarkable bones. IMPRESSION: Mild worsening of changes of congestive heart failure. Electronically Signed   By: Claudie Revering M.D.   On: 03/13/2015 09:35   Dg Fluoro Guide Cv Line-no Report  03/13/2015  CLINICAL DATA:  FLOURO GUIDE CV LINE Fluoroscopy was utilized by the requesting physician.  No radiographic interpretation.   Scheduled: . aspirin EC  81 mg Oral Daily  . carvedilol  6.25 mg Oral BID WC  . Chlorhexidine Gluconate Cloth  6 each Topical Q0600  . cloNIDine  0.1 mg Oral TID  . darbepoetin (ARANESP) injection - NON-DIALYSIS  150 mcg Subcutaneous Q Fri-1800  . heparin  5,000 Units Subcutaneous 3 times per day  . hydrALAZINE  25 mg Oral 3 times per day  . insulin aspart  0-9 Units Subcutaneous TID WC  . isosorbide mononitrate  60 mg Oral q morning - 10a  . sodium chloride flush  3 mL Intravenous Q12H     LOS: 4 days   Gilberto Streck C 03/14/2015,12:43 PM

## 2015-03-15 ENCOUNTER — Encounter (HOSPITAL_COMMUNITY): Payer: Self-pay | Admitting: Vascular Surgery

## 2015-03-15 LAB — CBC
HCT: 21.4 % — ABNORMAL LOW (ref 39.0–52.0)
HEMOGLOBIN: 7.2 g/dL — AB (ref 13.0–17.0)
MCH: 30.1 pg (ref 26.0–34.0)
MCHC: 33.6 g/dL (ref 30.0–36.0)
MCV: 89.5 fL (ref 78.0–100.0)
PLATELETS: 218 10*3/uL (ref 150–400)
RBC: 2.39 MIL/uL — AB (ref 4.22–5.81)
RDW: 13.8 % (ref 11.5–15.5)
WBC: 7.3 10*3/uL (ref 4.0–10.5)

## 2015-03-15 LAB — HEPATITIS B SURFACE ANTIGEN: HEP B S AG: NEGATIVE

## 2015-03-15 LAB — PREPARE RBC (CROSSMATCH)

## 2015-03-15 LAB — GLUCOSE, CAPILLARY
GLUCOSE-CAPILLARY: 103 mg/dL — AB (ref 65–99)
GLUCOSE-CAPILLARY: 197 mg/dL — AB (ref 65–99)
Glucose-Capillary: 143 mg/dL — ABNORMAL HIGH (ref 65–99)

## 2015-03-15 LAB — BASIC METABOLIC PANEL
ANION GAP: 10 (ref 5–15)
BUN: 57 mg/dL — ABNORMAL HIGH (ref 6–20)
CHLORIDE: 101 mmol/L (ref 101–111)
CO2: 21 mmol/L — AB (ref 22–32)
CREATININE: 5.36 mg/dL — AB (ref 0.61–1.24)
Calcium: 6.7 mg/dL — ABNORMAL LOW (ref 8.9–10.3)
GFR calc non Af Amer: 10 mL/min — ABNORMAL LOW (ref >60)
GFR, EST AFRICAN AMERICAN: 12 mL/min — AB (ref >60)
Glucose, Bld: 114 mg/dL — ABNORMAL HIGH (ref 65–99)
POTASSIUM: 4.2 mmol/L (ref 3.5–5.1)
SODIUM: 132 mmol/L — AB (ref 135–145)

## 2015-03-15 LAB — IMMUNOFIXATION ELECTROPHORESIS
IGA: 265 mg/dL (ref 61–437)
IGG (IMMUNOGLOBIN G), SERUM: 2797 mg/dL — AB (ref 700–1600)
IGM, SERUM: 83 mg/dL (ref 20–172)
Total Protein ELP: 7.1 g/dL (ref 6.0–8.5)

## 2015-03-15 LAB — HEPATITIS B CORE ANTIBODY, TOTAL: HEP B C TOTAL AB: NEGATIVE

## 2015-03-15 LAB — HEPATITIS B SURFACE ANTIBODY,QUALITATIVE: HEP B S AB: NONREACTIVE

## 2015-03-15 MED ORDER — ALTEPLASE 2 MG IJ SOLR: 2.0000 mg | Freq: Once | INTRAMUSCULAR | Status: DC | PRN

## 2015-03-15 MED ORDER — HEPARIN SODIUM (PORCINE) 1000 UNIT/ML DIALYSIS
1000.0000 [IU] | INTRAMUSCULAR | Status: DC | PRN
  Filled 2015-03-15: qty 1

## 2015-03-15 MED ORDER — SODIUM CHLORIDE 0.9 % IV SOLN
125.0000 mg | Freq: Every day | INTRAVENOUS | Status: AC
  Administered 2015-03-15 – 2015-03-17 (×3): 125 mg via INTRAVENOUS
  Filled 2015-03-15 (×7): qty 10

## 2015-03-15 MED ORDER — PENTAFLUOROPROP-TETRAFLUOROETH EX AERO: 1.0000 "application " | INHALATION_SPRAY | CUTANEOUS | Status: DC | PRN

## 2015-03-15 MED ORDER — HEPARIN SODIUM (PORCINE) 1000 UNIT/ML DIALYSIS
20.0000 [IU]/kg | INTRAMUSCULAR | Status: DC | PRN
  Filled 2015-03-15: qty 2

## 2015-03-15 MED ORDER — LIDOCAINE-PRILOCAINE 2.5-2.5 % EX CREA
1.0000 | TOPICAL_CREAM | CUTANEOUS | Status: DC | PRN
Start: 2015-03-15 — End: 2015-03-15

## 2015-03-15 MED ORDER — CALCIUM ACETATE (PHOS BINDER) 667 MG PO CAPS
1334.0000 mg | ORAL_CAPSULE | Freq: Three times a day (TID) | ORAL | Status: DC
Start: 2015-03-15 — End: 2015-03-20
  Administered 2015-03-15 – 2015-03-19 (×10): 1334 mg via ORAL
  Filled 2015-03-15 (×11): qty 2

## 2015-03-15 MED ORDER — SODIUM CHLORIDE 0.9 % IV SOLN: 100.0000 mL | INTRAVENOUS | Status: DC | PRN

## 2015-03-15 MED ORDER — LIDOCAINE HCL (PF) 1 % IJ SOLN: 5.0000 mL | INTRAMUSCULAR | Status: DC | PRN

## 2015-03-15 MED ORDER — SODIUM CHLORIDE 0.9 % IV SOLN
Freq: Once | INTRAVENOUS | Status: AC
  Administered 2015-03-15: 10:00:00 via INTRAVENOUS

## 2015-03-15 MED ORDER — TRAMADOL HCL 50 MG PO TABS
ORAL_TABLET | ORAL | Status: AC
  Filled 2015-03-15: qty 1

## 2015-03-15 MED ORDER — RENA-VITE PO TABS
1.0000 | ORAL_TABLET | Freq: Every day | ORAL | Status: DC
Start: 2015-03-15 — End: 2015-03-20
  Administered 2015-03-15 – 2015-03-19 (×5): 1 via ORAL
  Filled 2015-03-15 (×5): qty 1

## 2015-03-15 NOTE — Progress Notes (Signed)
[facility]                                                                              Patient Demographics  Eric [name], is a 60 y.o. male, DOB - [birth year], [id number]  Admit date - 03/10/2015   Admitting Physician Waldemar Dickens, MD  Outpatient Primary MD for the patient is Eric [name], Eric Cage, NP  LOS - 5   Chief Complaint  Patient presents with  . Chest Pain  . Shortness of Breath       Brief HPI   Eric [name] is a 60 y.o. 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 Pt presents today with complaints of chest pain and shortness of breath for several days becoming increasingly worse prompting visit to ED.   Pt states he is compliant with home meds. He denied recent fever, dizziness or palpitations, abdominal pain, n/v/d. ED evaluation consistent with acute HF exacerbation, creatinine up to 5.9 from baseline around 3, Hgb 7.3. Pt was admitted for further evaluation and treatment.   Assessment & Plan    Acute on chronic mixed systolic/diastolic CHF: -BNP [id number], 2-D echo 6/16 had shown EF of 45-50%, moderate pulmonary hypertension with underlying history of chronic kidney disease, now stage IV-V - Appreciate cardiology and nephrology recommendations - No chest pain, troponins slightly elevated, likely due to acute on chronic CHF, CKD - 2-D echo showed EF of 45-50% with hypokinesis of the inferolateral myocardium, hypokinesis of the inferior myocardium, diastolic function normal - continue imdur, coreg.  No ACEI/ARB secondary to renal failure - temporary dialysis cath placed, HD started on 2/18  Acute on chronic renal failure/stage III CKD: now stage IV-V: Likely due to long-standing history of uncontrolled diabetes, hypertensive nephropathy -baseline Cr ~3,  up to 5.9 on admission, no significant  improvement with IV diuresis -  nephrology and vascular surgery consulted, temporary tunneled dialysis catheter placed, HD started, first dialysis on 2/18, nephrology planning dialysis today - CLIP process started  - permanent access planned on 2/22 by vascular surgery   Anemia: Normochromic normocytic - Hemoccult negative - Hemoglobin 7.2,anemia panel consistent with anemia of chronic disease  - Started on Aranesp, blood transfusion planned during hemodialysis  Chest pain with hx CAD -No chest pain currently, troponins slightly positive due to #1 and 2  -ASA qd , cardiology following   DM2 -A1C 6.2% 06/2014 -Continue sliding scale insulin  Hx hypertension -stable -continue home meds  Hx cardiac arrest s/p ICD placement  Hx Hep C cirrhosis LFTs normal  MGUS Follows outpt with [name]  Code Status: FULL CODE  Family Communication: Discussed in detail with the patient, all imaging results, lab results explained to the patient   Disposition Plan: CLIP process explained to the patient Time Spent in minutes 15 minutes  Procedures  2-D echo Tunneled dialysis catheter 2/18  Consults   Cardiology Nephrology Vascular surgery  DVT Prophylaxis  heparin   Medications  Scheduled Meds: . aspirin EC  81 mg Oral Daily  .  calcium acetate  1,334 mg Oral TID WC  . carvedilol  6.25 mg Oral BID WC  . Chlorhexidine Gluconate Cloth  6 each Topical Q0600  . cloNIDine  0.1 mg Oral TID  . darbepoetin (ARANESP) injection - NON-DIALYSIS  150 mcg Subcutaneous Q Fri-1800  . ferric gluconate (FERRLECIT/NULECIT) IV  125 mg Intravenous Daily  . heparin  5,000 Units Subcutaneous 3 times per day  . hydrALAZINE  25 mg Oral 3 times per day  . insulin aspart  0-9 Units Subcutaneous TID WC  . isosorbide mononitrate  60 mg Oral q morning - 10a  . multivitamin  1 tablet Oral QHS  . sodium chloride flush  3 mL Intravenous Q12H  . traMADol       Continuous Infusions:  PRN Meds:.sodium  chloride, sodium chloride, acetaminophen, alteplase, benzonatate, heparin, heparin, heparin, lidocaine (PF), lidocaine-prilocaine, methocarbamol, nitroGLYCERIN, ondansetron (ZOFRAN) IV, pentafluoroprop-tetrafluoroeth, sodium chloride flush, traMADol   Antibiotics   Anti-infectives    Start     Dose/Rate Route Frequency Ordered Stop   03/13/15 0700  cefUROXime (ZINACEF) 1.5 g in dextrose 5 % 50 mL IVPB     1.5 g 100 mL/hr over 30 Minutes Intravenous On call to O.R. 03/13/15 0026 03/13/15 0740   03/13/15 0600  cefUROXime (ZINACEF) 1.5 g in dextrose 5 % 50 mL IVPB  Status:  Discontinued     1.5 g 100 mL/hr over 30 Minutes Intravenous On call to O.R. 03/13/15 0026 03/13/15 0029        Subjective:   Eric [name] was seen and examined today. No complaints today. Lower extremity edema improving. No chest pain or shortness of breath. Patient denies dizziness,  abdominal pain, N/V/D/C, new weakness, numbess, tingling. No acute events overnight.    Objective:   Filed Vitals:   03/15/15 1115 03/15/15 1130 03/15/15 1145 03/15/15 1200  BP: 165/88 172/91 162/97 171/87  Pulse: 79 81 79 81  Temp: 97.4 F (36.3 C)  97.2 F (36.2 C)   TempSrc:      Resp:      Height:      Weight:      SpO2:        Intake/Output Summary (Last 24 hours) at 03/15/15 1218 Last data filed at 03/15/15 0841  Gross per 24 hour  Intake   1140 ml  Output    225 ml  Net    915 ml     Wt Readings from Last 3 Encounters:  03/15/15 90.7 kg (199 lb 15.3 oz)  08/08/14 82.827 kg (182 lb 9.6 oz)  07/24/14 87.091 kg (192 lb)     Exam  General: Alert and oriented x 3, NAD  HEENT:   Neck: Supple, + JVD  CVS: S1 S2 clear, regular rate and rhythm   Respiratory: CTA B, no wheezing  Abdomen: Soft, nontender, distended, + bowel sounds  Ext: no cyanosis clubbing, 1-2+ edema  Neuro: no new deficits   Skin: No rashes  Psych: Normal affect and demeanor, alert and oriented x3    Data Review   Micro  Results Recent Results (from the past 240 hour(s))  Surgical pcr screen     Status: None   Collection Time: 03/13/15  5:11 AM  Result Value Ref Range Status   MRSA, PCR NEGATIVE NEGATIVE Final   Staphylococcus aureus NEGATIVE NEGATIVE Final    Comment:        The Xpert SA Assay (FDA approved for NASAL specimens in patients over 50 years of age), is  one component of a comprehensive surveillance program.  Test performance has been validated by [facility] for patients greater than or equal to 46 year old. It is not intended to diagnose infection nor to guide or monitor treatment.     Radiology Reports Dg Chest 2 View  03/10/2015  CLINICAL DATA:  Cough with shortness of breath for a few weeks. History of hypertension and diabetes. EXAM: CHEST  2 VIEW COMPARISON:  Radiographs 06/29/2014 and 08/03/2014. FINDINGS: Left subclavian AICD lead appears unchanged at the right ventricular apex. There is stable mild cardiac enlargement status post median sternotomy and CABG. Compared with the most recent study, the bilateral airspace opacities have partially cleared. There are residual opacities suspicious for edema. No significant residual pleural fluid. A small asymmetric nodular density inferiorly on the right on the frontal examination may reflect a nipple shadow. The bones appear unchanged. IMPRESSION: Recurrent congestive heart failure with pulmonary edema, less advanced than on the most recent study. Possible nipple shadow projecting over the right lung base; attention on follow-up after the edema has resolved recommended. Electronically Signed   By: Richardean Sale M.D.   On: 03/10/2015 12:51   Dg Chest Port 1 View  03/13/2015  CLINICAL DATA:  End-stage renal disease, on dialysis.  Ex-smoker. EXAM: PORTABLE CHEST 1 VIEW COMPARISON:  03/10/2015. FINDINGS: Progressive enlargement of the cardiac silhouette. Increased prominence of the pulmonary vasculature and interstitial markings. Possible small  right pleural effusion. Interval right jugular double-lumen catheter with 1 lumen tip in the inferior aspect of the superior vena cava and the other lumen tip at the superior cavoatrial junction or upper right atrium. No pneumothorax. Stable left subclavian AICD lead and median sternotomy wires. Unremarkable bones. IMPRESSION: Mild worsening of changes of congestive heart failure. Electronically Signed   By: Claudie Revering M.D.   On: 03/13/2015 09:35   Dg Fluoro Guide Cv Line-no Report  03/13/2015  CLINICAL DATA:  FLOURO GUIDE CV LINE Fluoroscopy was utilized by the requesting physician.  No radiographic interpretation.    CBC  Recent Labs Lab 03/12/15 0500 03/13/15 0541 03/13/15 1138 03/14/15 0313 03/15/15 0638  WBC 6.3 6.8 7.7 5.5 7.3  HGB 7.0* 7.0* 7.0* 6.3* 7.2*  HCT 21.3* 20.9* 21.7* 19.1* 21.4*  PLT 174 206 235 209 218  MCV 87.3 88.2 87.9 87.2 89.5  MCH 28.7 29.5 28.3 28.8 30.1  MCHC 32.9 33.5 32.3 33.0 33.6  RDW 13.6 13.7 13.5 13.4 13.8    Chemistries   Recent Labs Lab 03/10/15 2138 03/11/15 0127 03/12/15 0500 03/13/15 0541 03/13/15 1139 03/15/15 0638  NA  --  136 136 134* 131* 132*  K  --  4.2 4.3 4.4 4.2 4.2  CL  --  107 109 104 99* 101  CO2  --  16* 16* 14* 17* 21*  GLUCOSE  --  132* 90 75 92 114*  BUN  --  81* 85* 88* 86* 57*  CREATININE 6.13* 6.05* 6.07* 6.34* 6.34* 5.36*  CALCIUM  --  6.4* 6.5* 6.6* 6.5* 6.7*  AST 24  --   --   --   --   --   ALT 35  --   --   --   --   --   ALKPHOS 61  --   --   --   --   --   BILITOT 0.4  --   --   --   --   --    ------------------------------------------------------------------------------------------------------------------ estimated creatinine clearance is 15.3 mL/min (  by C-G formula based on Cr of 5.36). ------------------------------------------------------------------------------------------------------------------ No results for input(s): HGBA1C in the last 72  hours. ------------------------------------------------------------------------------------------------------------------ No results for input(s): CHOL, HDL, LDLCALC, TRIG, CHOLHDL, LDLDIRECT in the last 72 hours. ------------------------------------------------------------------------------------------------------------------ No results for input(s): TSH, T4TOTAL, T3FREE, THYROIDAB in the last 72 hours.  Invalid input(s): FREET3 ------------------------------------------------------------------------------------------------------------------ No results for input(s): VITAMINB12, FOLATE, FERRITIN, TIBC, IRON, RETICCTPCT in the last 72 hours.  Coagulation profile  Recent Labs Lab 03/13/15 0541  INR 1.43    No results for input(s): DDIMER in the last 72 hours.  Cardiac Enzymes  Recent Labs Lab 03/11/15 2122 03/12/15 0500 03/12/15 1002  TROPONINI 0.04* 0.05* 0.03   ------------------------------------------------------------------------------------------------------------------ Invalid input(s): POCBNP   Recent Labs  03/13/15 1128 03/13/15 1624 03/14/15 1137 03/14/15 1630 03/14/15 2331 03/15/15 0627  GLUCAP 159* 139* 99 172* 116* 103*     Jameelah Watts M.D. [facility] 03/15/2015, 12:18 PM  Pager: [phone] Between 7am to 7pm - call Pager - [phone]  After 7pm go to [email or web address] - password [email or web address]  Call night coverage person covering after 7pm

## 2015-03-15 NOTE — Progress Notes (Signed)
OT Screen Note  Patient Details Name: Eric [name] MRN: [id number] DOB: [birth year]   Cancelled Treatment:    Reason Eval/Treat Not Completed: OT screened, no needs identified, will sign off. Pt provided energy conservation handout but declined evaluation. Pt states "at home i have help.My daughter and wife already know all that."  Ronnald Ramp Kinnie Feil   OTR/L Pager: [phone] Office: [phone] .  03/15/2015, 2:13 PM

## 2015-03-15 NOTE — Progress Notes (Signed)
   Daily Progress Note   Will schedule patient for R RC vs BC AVF placement this week: maybe Wed/Thursday at earliest  Will check on patient tomorrow  Adele Barthel, MD [facility] Office: [phone] Pager: [phone]  03/15/2015, 8:07 AM

## 2015-03-15 NOTE — Progress Notes (Signed)
Nutrition Education Note  RD consulted for Renal Education. Provided Choose-A-Meal Booklet to patient/family. Reviewed food groups and provided written recommended serving sizes specifically determined for patient's current nutritional status.   Explained why diet restrictions are needed and provided lists of foods to limit/avoid that are high potassium, sodium, and phosphorus. Provided specific recommendations on safer alternatives of these foods. Strongly encouraged compliance of this diet.   Discussed importance of protein intake at each meal and snack. Provided examples of how to maximize protein intake throughout the day. Discussed need for fluid restriction with dialysis, importance of minimizing weight gain between HD treatments, and renal-friendly beverage options.  Encouraged pt to discuss specific diet questions/concerns with RD at HD outpatient facility. Teach back method used. Pt states that he was already following a low sodium diet and eating protein with every meal PTA. His wife and daughter assist with cooking and grocery shopping and help him with his diet.   Expect good compliance.  Body mass index is 32.29 kg/(m^2). Pt meets criteria for Obesity based on current BMI.  Current diet order is Renal diet with 1.2 L fluid restriction, patient is consuming approximately 100% of meals at this time. Labs and medications reviewed. No further nutrition interventions warranted at this time. RD contact information provided. If additional nutrition issues arise, please re-consult RD.  Scarlette Ar RD, LDN Inpatient Clinical Dietitian Pager: [phone] After Hours Pager: [phone]

## 2015-03-15 NOTE — Progress Notes (Signed)
Dialysis treatment completed.  4200 mL ultrafiltrated.  3000 mL net fluid removal.  Patient status unchanged. Lung sounds clear to ausculation in all fields. BLE 2+pitting edema. Cardiac: NSR.  Cleansed RIJ catheter with chlorhexidine.  Disconnected lines and flushed ports with saline per protocol.  Ports locked with heparin and capped per protocol.    Report given to bedside, RN Tiffany.

## 2015-03-15 NOTE — Progress Notes (Signed)
Patient arrived to unit by bed.  Reviewed treatment plan and this RN agrees with plan.  Report received from bedside RN, Tiffany.  Consent verified.  Patient A & O X 4.   Lung sounds clear to ausculation in all fields. BLE 2+ pitting   edema. Cardiac:  NSR.  Removed caps and cleansed RIJ catheter with chlorhedxidine.  Aspirated ports of heparin and flushed them with saline per protocol.  Connected and secured lines, initiated treatment at 0925.  UF Goal of 4230mL and net fluid removal 3L.  Will continue to monitor.

## 2015-03-15 NOTE — Progress Notes (Signed)
Subjective:  Minimal UOP-  second HD treatment today  Objective Vital signs in last 24 hours: Filed Vitals:   03/14/15 1315 03/14/15 2328 03/15/15 0444 03/15/15 0735  BP: 114/63 125/68 147/87 138/73  Pulse: 69 66 70 67  Temp: 98.5 F (36.9 C) 97.5 F (36.4 C) 97.4 F (36.3 C) 97.7 F (36.5 C)  TempSrc: Oral Oral Oral Oral  Resp: 20 18 17 18   Height:      Weight:   90.719 kg (200 lb)   SpO2: 99% 97% 95% 100%   Weight change: -2.28 kg (-5 lb 0.4 oz)  Intake/Output Summary (Last 24 hours) at 03/15/15 [id number] Last data filed at 03/15/15 0841  Gross per 24 hour  Intake   1290 ml  Output    225 ml  Net   1065 ml    Assessment/ Plan: Pt is a 60 y.o. yo male with DM, HTN, CAD s/p CABG and ICD as well as hep c and cirrhosis who was admitted on 03/10/2015 with  volume overload and worsening renal function determined to now be at ESRD  Assessment/Plan: 1. Volume- CHF and failed diuretics due to advanced CKD- now requiring dialysis 2. ESRD - new diagnosis this admit- s/p first HD on Saturday- planning for second treatment today - plans for permanent access per [facility] later this week- will start to make arrangements for OP dialysis- had tunneled PC placed 2/18- apparently had an issue with [place] starting dialysis too quickly for him- will plan on 3rd tx on Wednesday  3. Anemia- advanced - iron stores low- will replete and start ESA- also getting blood today  4. Secondary hyperparathyroidism- check PTH- phos is 7.2 with low calc- will add phoslo  5. HTN/volume- UF as able with HD- anticipate will need downward titration of other HTN meds  6. Malnutrition- hopefully will improve with initiation of HD- add renavite    Micalah Cabezas A    Labs: Basic Metabolic Panel:  Recent Labs Lab 03/13/15 0541 03/13/15 1139 03/15/15 0638  NA 134* 131* 132*  K 4.4 4.2 4.2  CL 104 99* 101  CO2 14* 17* 21*  GLUCOSE 75 92 114*  BUN 88* 86* 57*  CREATININE 6.34* 6.34* 5.36*  CALCIUM 6.6* 6.5* 6.7*   PHOS  --  7.2*  --    Liver Function Tests:  Recent Labs Lab 03/10/15 2138 03/13/15 1139  AST 24  --   ALT 35  --   ALKPHOS 61  --   BILITOT 0.4  --   PROT 8.0  --   ALBUMIN 1.8* 1.9*   No results for input(s): LIPASE, AMYLASE in the last 168 hours. No results for input(s): AMMONIA in the last 168 hours. CBC:  Recent Labs Lab 03/12/15 0500 03/13/15 0541 03/13/15 1138 03/14/15 0313 03/15/15 0638  WBC 6.3 6.8 7.7 5.5 7.3  HGB 7.0* 7.0* 7.0* 6.3* 7.2*  HCT 21.3* 20.9* 21.7* 19.1* 21.4*  MCV 87.3 88.2 87.9 87.2 89.5  PLT 174 206 235 209 218   Cardiac Enzymes:  Recent Labs Lab 03/11/15 0125 03/11/15 0736 03/11/15 2122 03/12/15 0500 03/12/15 1002  TROPONINI 0.06* 0.05* 0.04* 0.05* 0.03   CBG:  Recent Labs Lab 03/13/15 1624 03/14/15 1137 03/14/15 1630 03/14/15 2331 03/15/15 0627  GLUCAP 139* 99 172* 116* 103*    Iron Studies: No results for input(s): IRON, TIBC, TRANSFERRIN, FERRITIN in the last 72 hours. Studies/Results: No results found. Medications: Infusions:    Scheduled Medications: . sodium chloride   Intravenous Once  .  aspirin EC  81 mg Oral Daily  . carvedilol  6.25 mg Oral BID WC  . Chlorhexidine Gluconate Cloth  6 each Topical Q0600  . cloNIDine  0.1 mg Oral TID  . darbepoetin (ARANESP) injection - NON-DIALYSIS  150 mcg Subcutaneous Q Fri-1800  . heparin  5,000 Units Subcutaneous 3 times per day  . hydrALAZINE  25 mg Oral 3 times per day  . insulin aspart  0-9 Units Subcutaneous TID WC  . isosorbide mononitrate  60 mg Oral q morning - 10a  . sodium chloride flush  3 mL Intravenous Q12H  . traMADol        have reviewed scheduled and prn medications.  Physical Exam: General: Odd affect Heart: RRR Lungs: mostly clear Abdomen: distended Extremities: pitting edema Dialysis Access: right sided PC    03/15/2015,9:18 AM  LOS: 5 days

## 2015-03-15 NOTE — Procedures (Signed)
Patient was seen on dialysis and the procedure was supervised.  BFR 250  Via PC BP is  high.   Patient appears to be tolerating treatment well- second treatment today   Eric [name] A 03/15/2015

## 2015-03-16 LAB — GLUCOSE, CAPILLARY
GLUCOSE-CAPILLARY: 85 mg/dL (ref 65–99)
GLUCOSE-CAPILLARY: 222 mg/dL — AB (ref 65–99)
GLUCOSE-CAPILLARY: 154 mg/dL — AB (ref 65–99)
Glucose-Capillary: 144 mg/dL — ABNORMAL HIGH (ref 65–99)

## 2015-03-16 LAB — BASIC METABOLIC PANEL
Anion gap: 10 (ref 5–15)
BUN: 34 mg/dL — AB (ref 6–20)
CALCIUM: 7.1 mg/dL — AB (ref 8.9–10.3)
CO2: 25 mmol/L (ref 22–32)
CREATININE: 4.4 mg/dL — AB (ref 0.61–1.24)
Chloride: 99 mmol/L — ABNORMAL LOW (ref 101–111)
GFR calc Af Amer: 15 mL/min — ABNORMAL LOW (ref >60)
GFR calc non Af Amer: 13 mL/min — ABNORMAL LOW (ref >60)
GLUCOSE: 96 mg/dL (ref 65–99)
Potassium: 3.7 mmol/L (ref 3.5–5.1)
Sodium: 134 mmol/L — ABNORMAL LOW (ref 135–145)

## 2015-03-16 LAB — CBC
HCT: 27 % — ABNORMAL LOW (ref 39.0–52.0)
HEMOGLOBIN: 8.8 g/dL — AB (ref 13.0–17.0)
MCH: 28.8 pg (ref 26.0–34.0)
MCHC: 32.6 g/dL (ref 30.0–36.0)
MCV: 88.2 fL (ref 78.0–100.0)
Platelets: 221 10*3/uL (ref 150–400)
RBC: 3.06 MIL/uL — ABNORMAL LOW (ref 4.22–5.81)
RDW: 14.1 % (ref 11.5–15.5)
WBC: 7.1 10*3/uL (ref 4.0–10.5)

## 2015-03-16 LAB — HEPATITIS B SURFACE ANTIBODY,QUALITATIVE: HEP B S AB: NONREACTIVE

## 2015-03-16 LAB — HEPATITIS B CORE ANTIBODY, TOTAL: Hep B Core Total Ab: NEGATIVE

## 2015-03-16 LAB — TYPE AND SCREEN
ABO/RH(D): B POS
ANTIBODY SCREEN: NEGATIVE
UNIT DIVISION: 0
Unit division: 0

## 2015-03-16 LAB — HEPATITIS B SURFACE ANTIGEN: HEP B S AG: NEGATIVE

## 2015-03-16 NOTE — Progress Notes (Signed)
   Daily Progress Note  Earliest can get patient on schedule is this coming Thursday for R RC vs BC AVF.  Will discuss with patient today/tomorrow.  Adele Barthel, MD [facility] Office: [phone] Pager: [phone]  03/16/2015, 7:33 AM

## 2015-03-16 NOTE — Progress Notes (Signed)
[facility]                                                                              Patient Demographics  Eric [name], is a 60 y.o. male, DOB - March 02, 1955, [id number]  Admit date - 03/10/2015   Admitting Physician Waldemar Dickens, MD  Outpatient Primary MD for the patient is [name], [name], NP  LOS - 6   Chief Complaint  Patient presents with  . Chest Pain  . Shortness of Breath       Brief HPI   Eric [name] is a 60 y.o. 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 Pt presents today with complaints of chest pain and shortness of breath for several days becoming increasingly worse prompting visit to ED.   Pt states he is compliant with home meds. He denied recent fever, dizziness or palpitations, abdominal pain, n/v/d. ED evaluation consistent with acute HF exacerbation, creatinine up to 5.9 from baseline around 3, Hgb 7.3. Pt was admitted for further evaluation and treatment.   Patient had no significant improvement with aggressive IV diuresis. Cardiology, Dr Einar Gip was initially consulted. It appears that patient has now ESRD due to long-standing history of uncontrolled diabetes, hypertensive nephropathy. Nephrology was consulted and also recommended starting patient on hemodialysis at this point. Temporary dialysis cath was placed and HD started on 2/18. Permanent access planned on 2/22. CLIP process started.    Assessment & Plan    Acute on chronic mixed systolic/diastolic CHF: -BNP [id number], 2-D echo 6/16 had shown EF of 45-50%, moderate pulmonary hypertension with underlying history of chronic kidney disease, now ESRD - Appreciate cardiology and nephrology recommendations. No chest pain, troponins slightly elevated, likely due to acute on chronic CHF, CKD - 2-D echo showed EF of 45-50% with  hypokinesis of the inferolateral myocardium, hypokinesis of the inferior myocardium, diastolic function normal - continue imdur, coreg.  No ACEI/ARB secondary to renal failure - temporary dialysis cath placed, HD started on 2/18, 2nd HD 2/20  Acute on chronic renal failure/stage III CKD: now stage IV-V: Likely due to long-standing history of uncontrolled diabetes, hypertensive nephropathy -baseline Cr ~3,  up to 5.9 on admission, no significant improvement with aggressive IV diuresis - nephrology and vascular surgery consulted, temporary tunneled dialysis catheter placed, HD started, first dialysis on 2/18, then 2/20 - CLIP process started  - permanent access planned on 2/22 by vascular surgery  Anemia: Normochromic normocytic - Hemoccult negative - anemia panel consistent with anemia of chronic disease  - Started on Aranesp, received blood transfusion planned during hemodialysis, H&H stable  Chest pain with hx CAD -No chest pain currently, troponins slightly positive due to #1 and 2  -ASA qd , cardiology following   DM2 -A1C 6.2% 06/2014 -Continue sliding scale insulin  Hx hypertension -stable -continue home meds  Hx cardiac arrest s/p ICD placement  Hx Hep C cirrhosis LFTs normal  MGUS Follows outpt with [name]  Code Status: FULL CODE  Family Communication: Discussed in detail with  the patient, all imaging results, lab results explained to the patient   Disposition Plan: CLIP process explained to the patient  Time Spent in minutes 25 minutes  Procedures  2-D echo Tunneled dialysis catheter 2/18  Consults   Cardiology Nephrology Vascular surgery  DVT Prophylaxis  heparin   Medications  Scheduled Meds: . aspirin EC  81 mg Oral Daily  . calcium acetate  1,334 mg Oral TID WC  . carvedilol  6.25 mg Oral BID WC  . Chlorhexidine Gluconate Cloth  6 each Topical Q0600  . cloNIDine  0.1 mg Oral TID  . darbepoetin (ARANESP) injection - NON-DIALYSIS  150 mcg  Subcutaneous Q Fri-1800  . ferric gluconate (FERRLECIT/NULECIT) IV  125 mg Intravenous Daily  . heparin  5,000 Units Subcutaneous 3 times per day  . hydrALAZINE  25 mg Oral 3 times per day  . insulin aspart  0-9 Units Subcutaneous TID WC  . isosorbide mononitrate  60 mg Oral q morning - 10a  . multivitamin  1 tablet Oral QHS  . sodium chloride flush  3 mL Intravenous Q12H   Continuous Infusions:  PRN Meds:.sodium chloride, sodium chloride, acetaminophen, alteplase, benzonatate, heparin, heparin, heparin, lidocaine (PF), lidocaine-prilocaine, methocarbamol, nitroGLYCERIN, ondansetron (ZOFRAN) IV, pentafluoroprop-tetrafluoroeth, sodium chloride flush, traMADol   Antibiotics   Anti-infectives    Start     Dose/Rate Route Frequency Ordered Stop   03/13/15 0700  cefUROXime (ZINACEF) 1.5 g in dextrose 5 % 50 mL IVPB     1.5 g 100 mL/hr over 30 Minutes Intravenous On call to O.R. 03/13/15 0026 03/13/15 0740   03/13/15 0600  cefUROXime (ZINACEF) 1.5 g in dextrose 5 % 50 mL IVPB  Status:  Discontinued     1.5 g 100 mL/hr over 30 Minutes Intravenous On call to O.R. 03/13/15 0026 03/13/15 0029        Subjective:   [name] was seen and examined today. No complaints. Overall improving, shortness of breath is improving, lower extremity edema is improving. No chest pain. Patient denies dizziness,  abdominal pain, N/V/D/C, new weakness, numbess, tingling. No acute events overnight.    Objective:   Filed Vitals:   03/15/15 1246 03/15/15 1926 03/16/15 0503 03/16/15 1222  BP: 176/93 162/81 143/74 132/69  Pulse: 80 75 73 67  Temp:  98.2 F (36.8 C) 98.7 F (37.1 C) 97.9 F (36.6 C)  TempSrc:  Oral Oral Oral  Resp:  18 18 18   Height:      Weight:   89.449 kg (197 lb 3.2 oz)   SpO2:  98% 98% 96%    Intake/Output Summary (Last 24 hours) at 03/16/15 1352 Last data filed at 03/16/15 1351  Gross per 24 hour  Intake   1438 ml  Output    850 ml  Net    588 ml     Wt Readings from  Last 3 Encounters:  03/16/15 89.449 kg (197 lb 3.2 oz)  08/08/14 82.827 kg (182 lb 9.6 oz)  07/24/14 87.091 kg (192 lb)     Exam  General: Alert and oriented x 3, NAD  HEENT:   Neck: Supple,   CVS: S1 S2 clear, regular rate and rhythm   Respiratory: Decreased breath sound at the bases mild basilar crackles  Abdomen: Soft, nontender, distended, + bowel sounds  Ext: no cyanosis clubbing, 1-2+ edema  Neuro: no new deficits   Skin: No rashes  Psych: Normal affect and demeanor, alert and oriented x3    Data Review   Micro  Results Recent Results (from the past 240 hour(s))  Surgical pcr screen     Status: None   Collection Time: 03/13/15  5:11 AM  Result Value Ref Range Status   MRSA, PCR NEGATIVE NEGATIVE Final   Staphylococcus aureus NEGATIVE NEGATIVE Final    Comment:        The Xpert SA Assay (FDA approved for NASAL specimens in patients over 9 years of age), is one component of a comprehensive surveillance program.  Test performance has been validated by [facility] for patients greater than or equal to 26 year old. It is not intended to diagnose infection nor to guide or monitor treatment.     Radiology Reports Dg Chest 2 View  03/10/2015  CLINICAL DATA:  Cough with shortness of breath for a few weeks. History of hypertension and diabetes. EXAM: CHEST  2 VIEW COMPARISON:  Radiographs 06/29/2014 and 08/03/2014. FINDINGS: Left subclavian AICD lead appears unchanged at the right ventricular apex. There is stable mild cardiac enlargement status post median sternotomy and CABG. Compared with the most recent study, the bilateral airspace opacities have partially cleared. There are residual opacities suspicious for edema. No significant residual pleural fluid. A small asymmetric nodular density inferiorly on the right on the frontal examination may reflect a nipple shadow. The bones appear unchanged. IMPRESSION: Recurrent congestive heart failure with pulmonary  edema, less advanced than on the most recent study. Possible nipple shadow projecting over the right lung base; attention on follow-up after the edema has resolved recommended. Electronically Signed   By: Richardean Sale M.D.   On: 03/10/2015 12:51   Dg Chest Port 1 View  03/13/2015  CLINICAL DATA:  End-stage renal disease, on dialysis.  Ex-smoker. EXAM: PORTABLE CHEST 1 VIEW COMPARISON:  03/10/2015. FINDINGS: Progressive enlargement of the cardiac silhouette. Increased prominence of the pulmonary vasculature and interstitial markings. Possible small right pleural effusion. Interval right jugular double-lumen catheter with 1 lumen tip in the inferior aspect of the superior vena cava and the other lumen tip at the superior cavoatrial junction or upper right atrium. No pneumothorax. Stable left subclavian AICD lead and median sternotomy wires. Unremarkable bones. IMPRESSION: Mild worsening of changes of congestive heart failure. Electronically Signed   By: Claudie Revering M.D.   On: 03/13/2015 09:35   Dg Fluoro Guide Cv Line-no Report  03/13/2015  CLINICAL DATA:  FLOURO GUIDE CV LINE Fluoroscopy was utilized by the requesting physician.  No radiographic interpretation.    CBC  Recent Labs Lab 03/13/15 0541 03/13/15 1138 03/14/15 0313 03/15/15 0638 03/16/15 0345  WBC 6.8 7.7 5.5 7.3 7.1  HGB 7.0* 7.0* 6.3* 7.2* 8.8*  HCT 20.9* 21.7* 19.1* 21.4* 27.0*  PLT 206 235 209 218 221  MCV 88.2 87.9 87.2 89.5 88.2  MCH 29.5 28.3 28.8 30.1 28.8  MCHC 33.5 32.3 33.0 33.6 32.6  RDW 13.7 13.5 13.4 13.8 14.1    Chemistries   Recent Labs Lab 03/10/15 2138  03/12/15 0500 03/13/15 0541 03/13/15 1139 03/15/15 0638 03/16/15 0345  NA  --   < > 136 134* 131* 132* 134*  K  --   < > 4.3 4.4 4.2 4.2 3.7  CL  --   < > 109 104 99* 101 99*  CO2  --   < > 16* 14* 17* 21* 25  GLUCOSE  --   < > 90 75 92 114* 96  BUN  --   < > 85* 88* 86* 57* 34*  CREATININE 6.13*  < >  6.07* 6.34* 6.34* 5.36* 4.40*  CALCIUM   --   < > 6.5* 6.6* 6.5* 6.7* 7.1*  AST 24  --   --   --   --   --   --   ALT 35  --   --   --   --   --   --   ALKPHOS 61  --   --   --   --   --   --   BILITOT 0.4  --   --   --   --   --   --   < > = values in this interval not displayed. ------------------------------------------------------------------------------------------------------------------ estimated creatinine clearance is 18.5 mL/min (by C-G formula based on Cr of 4.4). ------------------------------------------------------------------------------------------------------------------ No results for input(s): HGBA1C in the last 72 hours. ------------------------------------------------------------------------------------------------------------------ No results for input(s): CHOL, HDL, LDLCALC, TRIG, CHOLHDL, LDLDIRECT in the last 72 hours. ------------------------------------------------------------------------------------------------------------------ No results for input(s): TSH, T4TOTAL, T3FREE, THYROIDAB in the last 72 hours.  Invalid input(s): FREET3 ------------------------------------------------------------------------------------------------------------------ No results for input(s): VITAMINB12, FOLATE, FERRITIN, TIBC, IRON, RETICCTPCT in the last 72 hours.  Coagulation profile  Recent Labs Lab 03/13/15 0541  INR 1.43    No results for input(s): DDIMER in the last 72 hours.  Cardiac Enzymes  Recent Labs Lab 03/11/15 2122 03/12/15 0500 03/12/15 1002  TROPONINI 0.04* 0.05* 0.03   ------------------------------------------------------------------------------------------------------------------ Invalid input(s): POCBNP   Recent Labs  03/14/15 2331 03/15/15 0627 03/15/15 1635 03/15/15 2054 03/16/15 0613 03/16/15 Lat[place] M.D. [facility] 03/16/2015, 1:52 PM  Pager: [phone] Between 7am to 7pm - call Pager - [phone]  After 7pm go  to [email or web address] - password [email or web address]  Call night coverage person covering after 7pm

## 2015-03-16 NOTE — Progress Notes (Signed)
Subjective:  S/p HD yest- removed 3000- also 850 of UOP- BP is still high- looks pretty good sitting up in chair Objective Vital signs in last 24 hours: Filed Vitals:   03/15/15 1240 03/15/15 1246 03/15/15 1926 03/16/15 0503  BP: 171/100 176/93 162/81 143/74  Pulse: 80 80 75 73  Temp: 97.2 F (36.2 C)  98.2 F (36.8 C) 98.7 F (37.1 C)  TempSrc: Oral  Oral Oral  Resp: 21  18 18   Height:      Weight:    89.449 kg (197 lb 3.2 oz)  SpO2:   98% 98%   Weight change: -0.019 kg (-0.7 oz)  Intake/Output Summary (Last 24 hours) at 03/16/15 [id number] Last data filed at 03/16/15 [id number]  Gross per 24 hour  Intake   1080 ml  Output   3850 ml  Net  -2770 ml    Assessment/ Plan: Pt is Eric [name] 60 y.o. yo male with DM, HTN, CAD s/p CABG and ICD as well as hep c and cirrhosis who was admitted on 03/10/2015 with  volume overload and worsening renal function determined to now be at ESRD  Assessment/Plan: 1. Volume- CHF and failed diuretics due to advanced CKD- now requiring dialysis 2. ESRD - new diagnosis this admit- s/p first HD on Saturday- second treatment Monday - plans for permanent access per [facility] later this week (Thursday?)- will start to make arrangements for OP dialysis- had tunneled PC placed 2/18- apparently had an issue with [place] starting dialysis too quickly for him- will plan on 3rd tx for tomorrow 3. Anemia- advanced - iron stores low- will replete and start ESA- also got blood yest 4. Secondary hyperparathyroidism- check PTH- phos is 7.2 with low calc- will add phoslo  5. HTN/volume- UF as able with HD- anticipate will need downward titration of other HTN meds once volume is better 6. Malnutrition- hopefully will improve with initiation of HD although cirrhosis is likely not helping - add renavite    Eric Eric [name]    Labs: Basic Metabolic Panel:  Recent Labs Lab 03/13/15 1139 03/15/15 0638 03/16/15 0345  NA 131* 132* 134*  K 4.2 4.2 3.7  CL 99* 101 99*  CO2 17* 21* 25  GLUCOSE  92 114* 96  BUN 86* 57* 34*  CREATININE 6.34* 5.36* 4.40*  CALCIUM 6.5* 6.7* 7.1*  PHOS 7.2*  --   --    Liver Function Tests:  Recent Labs Lab 03/10/15 2138 03/13/15 1139  AST 24  --   ALT 35  --   ALKPHOS 61  --   BILITOT 0.4  --   PROT 8.0  --   ALBUMIN 1.8* 1.9*   No results for input(s): LIPASE, AMYLASE in the last 168 hours. No results for input(s): AMMONIA in the last 168 hours. CBC:  Recent Labs Lab 03/13/15 0541 03/13/15 1138 03/14/15 0313 03/15/15 0638 03/16/15 0345  WBC 6.8 7.7 5.5 7.3 7.1  HGB 7.0* 7.0* 6.3* 7.2* 8.8*  HCT 20.9* 21.7* 19.1* 21.4* 27.0*  MCV 88.2 87.9 87.2 89.5 88.2  PLT 206 235 209 218 221   Cardiac Enzymes:  Recent Labs Lab 03/11/15 0125 03/11/15 0736 03/11/15 2122 03/12/15 0500 03/12/15 1002  TROPONINI 0.06* 0.05* 0.04* 0.05* 0.03   CBG:  Recent Labs Lab 03/14/15 2331 03/15/15 0627 03/15/15 1635 03/15/15 2054 03/16/15 0613  GLUCAP 116* 103* 197* 143* 85    Iron Studies: No results for input(s): IRON, TIBC, TRANSFERRIN, FERRITIN in the last 72 hours. Studies/Results: No results found. Medications:  Infusions:    Scheduled Medications: . aspirin EC  81 mg Oral Daily  . calcium acetate  1,334 mg Oral TID WC  . carvedilol  6.25 mg Oral BID WC  . Chlorhexidine Gluconate Cloth  6 each Topical Q0600  . cloNIDine  0.1 mg Oral TID  . darbepoetin (ARANESP) injection - NON-DIALYSIS  150 mcg Subcutaneous Q Fri-1800  . ferric gluconate (FERRLECIT/NULECIT) IV  125 mg Intravenous Daily  . heparin  5,000 Units Subcutaneous 3 times per day  . hydrALAZINE  25 mg Oral 3 times per day  . insulin aspart  0-9 Units Subcutaneous TID WC  . isosorbide mononitrate  60 mg Oral q morning - 10a  . multivitamin  1 tablet Oral QHS  . sodium chloride flush  3 mL Intravenous Q12H    have reviewed scheduled and prn medications.  Physical Exam: General: Odd affect Heart: RRR Lungs: mostly clear Abdomen: distended Extremities: pitting  edema Dialysis Access: right sided PC    03/16/2015,9:21 AM  LOS: 6 days

## 2015-03-17 LAB — CBC
HEMATOCRIT: 26.8 % — AB (ref 39.0–52.0)
HEMOGLOBIN: 9 g/dL — AB (ref 13.0–17.0)
MCH: 30.5 pg (ref 26.0–34.0)
MCHC: 33.6 g/dL (ref 30.0–36.0)
MCV: 90.8 fL (ref 78.0–100.0)
Platelets: 201 10*3/uL (ref 150–400)
RBC: 2.95 MIL/uL — AB (ref 4.22–5.81)
RDW: 14.2 % (ref 11.5–15.5)
WBC: 8.2 10*3/uL (ref 4.0–10.5)

## 2015-03-17 LAB — RENAL FUNCTION PANEL
Albumin: 1.9 g/dL — ABNORMAL LOW (ref 3.5–5.0)
Anion gap: 14 (ref 5–15)
BUN: 41 mg/dL — ABNORMAL HIGH (ref 6–20)
CALCIUM: 7.6 mg/dL — AB (ref 8.9–10.3)
CHLORIDE: 96 mmol/L — AB (ref 101–111)
CO2: 23 mmol/L (ref 22–32)
CREATININE: 5.36 mg/dL — AB (ref 0.61–1.24)
GFR calc non Af Amer: 10 mL/min — ABNORMAL LOW (ref >60)
GFR, EST AFRICAN AMERICAN: 12 mL/min — AB (ref >60)
GLUCOSE: 125 mg/dL — AB (ref 65–99)
Phosphorus: 5 mg/dL — ABNORMAL HIGH (ref 2.5–4.6)
Potassium: 3.5 mmol/L (ref 3.5–5.1)
SODIUM: 133 mmol/L — AB (ref 135–145)

## 2015-03-17 LAB — GLUCOSE, CAPILLARY
GLUCOSE-CAPILLARY: 99 mg/dL (ref 65–99)
GLUCOSE-CAPILLARY: 219 mg/dL — AB (ref 65–99)
Glucose-Capillary: 137 mg/dL — ABNORMAL HIGH (ref 65–99)
Glucose-Capillary: 305 mg/dL — ABNORMAL HIGH (ref 65–99)
Glucose-Capillary: 129 mg/dL — ABNORMAL HIGH (ref 65–99)

## 2015-03-17 LAB — PARATHYROID HORMONE, INTACT (NO CA): PTH: 465 pg/mL — AB (ref 15–65)

## 2015-03-17 MED ORDER — HYDROCOD POLST-CPM POLST ER 10-8 MG/5ML PO SUER
5.0000 mL | Freq: Once | ORAL | Status: DC
  Filled 2015-03-17: qty 5

## 2015-03-17 NOTE — Progress Notes (Addendum)
[facility]                                                                              Patient Demographics  Princeton Carnahan, is a 60 y.o. male, DOB - [birth year], [id number]  Admit date - 03/10/2015   Admitting Physician Waldemar Dickens, MD  Outpatient Primary MD for the patient is [name], [name], NP  LOS - 7   Chief Complaint  Patient presents with  . Chest Pain  . Shortness of Breath      HPI on 03/10/2015 by Ms. Elmo Putt, NP Daquain Contorno is a 60 y.o. 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 Pt presents today with complaints of chest pain and shortness of breath for several days becoming increasingly worse prompting visit to ED.   Pt states he is compliant with home meds. He denies recent fever, dizziness or palpitations, abdominal pain, n/v/d. He states he does feel constipated. ED evaluation consistent with acute HF exacerbation, creatinine up to 5.9 from baseline around 3, Hgb 7.3. Pt to be admitted for further evaluation and treatment.    Assessment & Plan   Acute on chronic systolic and diastolic CHF -BNP [id number] -Echocardiogram: EF 45-50%, left ventricular diastolic function parameters are normal  -Cardiology consulted and appreciated -Nephrology consulted and appreciated, patient started on hemodialysis and has lost several liters of fluid -Continue indoor, Coreg (no ACEI/ARB due to renal failure)  Acute on chronic anemia disease, stage III, progressing to stage IV-V -Patient does have uncontrolled diabetes and hypertensive nephropathy -Baseline creatinine approximately 3, 5 admission -No significant improvement with IV diuresis -Nephrology and vascular surgery consulted and appreciated -Patient currently has temporary femoral dialysis catheter in place -Patient is due to receive permanent access  on 2/23 by vascular surgery -Status post dialysis today, 4 L removed  Normocytic anemia -Hemoccult negative -Continue panel consistent with anemia of chronic disease -Continue Aranesp -Monitor CBC  Chest pain/mildly elevated troponin -Currently chest pain-free -Troponin mildly elevated secondary to the above -Cardiology was consulted -Continue aspirin  Diabetes mellitus, type II -Hemoglobin A1c 6.2 in June 2016 -Continue insulin sliding scale CBG monitoring  Hypertension -Continue Coreg, clonidine, hydralazine  History of cardiac arrest -Status post ICD placement  Hepatitis C/cirrhosis -LFTs normal  MGUS -Patient follows up with Dr. Marin Olp  Code Status: Full  Family Communication: None at bedside  Disposition Plan: Admitted. Pending access  Time Spent in minutes   30 minutes  Procedures  Echocardiogram Clinical dialysis catheter placed on 2/18  Consults   Cardiology Nephrology Vascular surgery  DVT Prophylaxis  heparin  Lab Results  Component Value Date   PLT 201 03/17/2015    Medications  Scheduled Meds: . aspirin EC  81 mg Oral Daily  . calcium acetate  1,334 mg Oral TID WC  . carvedilol  6.25 mg Oral BID WC  . Chlorhexidine Gluconate Cloth  6 each Topical Q0600  . cloNIDine  0.1 mg Oral TID  . darbepoetin (ARANESP) injection - NON-DIALYSIS  150 mcg Subcutaneous Q Fri-1800  . ferric gluconate (FERRLECIT/NULECIT) IV  125 mg Intravenous Daily  . heparin  5,000 Units Subcutaneous 3 times per day  . hydrALAZINE  25 mg Oral 3 times per day  . insulin aspart  0-9 Units Subcutaneous TID WC  . isosorbide mononitrate  60 mg Oral q morning - 10a  . multivitamin  1 tablet Oral QHS  . sodium chloride flush  3 mL Intravenous Q12H   Continuous Infusions:  PRN Meds:.sodium chloride, sodium chloride, acetaminophen, alteplase, benzonatate, heparin, heparin, heparin, lidocaine (PF), lidocaine-prilocaine, methocarbamol, nitroGLYCERIN, ondansetron (ZOFRAN) IV,  pentafluoroprop-tetrafluoroeth, sodium chloride flush, traMADol  Antibiotics    Anti-infectives    Start     Dose/Rate Route Frequency Ordered Stop   03/13/15 0700  cefUROXime (ZINACEF) 1.5 g in dextrose 5 % 50 mL IVPB     1.5 g 100 mL/hr over 30 Minutes Intravenous On call to O.R. 03/13/15 0026 03/13/15 0740   03/13/15 0600  cefUROXime (ZINACEF) 1.5 g in dextrose 5 % 50 mL IVPB  Status:  Discontinued     1.5 g 100 mL/hr over 30 Minutes Intravenous On call to O.R. 03/13/15 0026 03/13/15 0029      Subjective:   [name] seen and examined today.  Patient has no complaints today. Feels shortness of breath has improved. Also feels his lower extremity edema is also improving. Currently denies any chest pain, abdominal pain, nausea or vomiting, dizziness or headache.  Objective:   Filed Vitals:   03/17/15 1130 03/17/15 1152 03/17/15 1220 03/17/15 1235  BP: 169/78  172/84 165/87  Pulse: 77  78 78  Temp:  98.2 F (36.8 C) 98.2 F (36.8 C)   TempSrc:   Oral   Resp: 24  18   Height:      Weight:      SpO2:   94%     Wt Readings from Last 3 Encounters:  03/17/15 90.4 kg (199 lb 4.7 oz)  08/08/14 82.827 kg (182 lb 9.6 oz)  07/24/14 87.091 kg (192 lb)     Intake/Output Summary (Last 24 hours) at 03/17/15 1358 Last data filed at 03/17/15 1245  Gross per 24 hour  Intake    340 ml  Output    525 ml  Net   -185 ml    Exam  General: Well developed, well nourished, NAD, appears stated age  HEENT: NCAT, mucous membranes moist.   Cardiovascular: S1 S2 auscultated, no rubs, murmurs or gallops. Regular rate and rhythm.  Respiratory: Diminished but clear breath sounds  Abdomen: Soft, nontender, nondistended, + bowel sounds  Extremities: warm dry without cyanosis clubbing, 2+ edema in LE B/L   Neuro: AAOx3, nonfocal  Psych: Normal affect and demeanor    Data Review   Micro Results Recent Results (from the past 240 hour(s))  Surgical pcr screen     Status: None     Collection Time: 03/13/15  5:11 AM  Result Value Ref Range Status   MRSA, PCR NEGATIVE NEGATIVE Final   Staphylococcus aureus NEGATIVE NEGATIVE Final    Comment:        The Xpert SA Assay (FDA approved for NASAL specimens in patients over 45 years of age), is one component of a comprehensive surveillance program.  Test performance has been validated by [facility] for patients greater than or equal to 39 year old. It is not intended to diagnose infection nor to guide or monitor treatment.     Radiology Reports Dg Chest 2 View  03/10/2015  CLINICAL DATA:  Cough with shortness of breath  for a few weeks. History of hypertension and diabetes. EXAM: CHEST  2 VIEW COMPARISON:  Radiographs 06/29/2014 and 08/03/2014. FINDINGS: Left subclavian AICD lead appears unchanged at the right ventricular apex. There is stable mild cardiac enlargement status post median sternotomy and CABG. Compared with the most recent study, the bilateral airspace opacities have partially cleared. There are residual opacities suspicious for edema. No significant residual pleural fluid. A small asymmetric nodular density inferiorly on the right on the frontal examination may reflect a nipple shadow. The bones appear unchanged. IMPRESSION: Recurrent congestive heart failure with pulmonary edema, less advanced than on the most recent study. Possible nipple shadow projecting over the right lung base; attention on follow-up after the edema has resolved recommended. Electronically Signed   By: Richardean Sale M.D.   On: 03/10/2015 12:51   Dg Chest Port 1 View  03/13/2015  CLINICAL DATA:  End-stage renal disease, on dialysis.  Ex-smoker. EXAM: PORTABLE CHEST 1 VIEW COMPARISON:  03/10/2015. FINDINGS: Progressive enlargement of the cardiac silhouette. Increased prominence of the pulmonary vasculature and interstitial markings. Possible small right pleural effusion. Interval right jugular double-lumen catheter with 1 lumen tip in  the inferior aspect of the superior vena cava and the other lumen tip at the superior cavoatrial junction or upper right atrium. No pneumothorax. Stable left subclavian AICD lead and median sternotomy wires. Unremarkable bones. IMPRESSION: Mild worsening of changes of congestive heart failure. Electronically Signed   By: Claudie Revering M.D.   On: 03/13/2015 09:35   Dg Fluoro Guide Cv Line-no Report  03/13/2015  CLINICAL DATA:  FLOURO GUIDE CV LINE Fluoroscopy was utilized by the requesting physician.  No radiographic interpretation.    CBC  Recent Labs Lab 03/13/15 1138 03/14/15 0313 03/15/15 0638 03/16/15 0345 03/17/15 0730  WBC 7.7 5.5 7.3 7.1 8.2  HGB 7.0* 6.3* 7.2* 8.8* 9.0*  HCT 21.7* 19.1* 21.4* 27.0* 26.8*  PLT 235 209 218 221 201  MCV 87.9 87.2 89.5 88.2 90.8  MCH 28.3 28.8 30.1 28.8 30.5  MCHC 32.3 33.0 33.6 32.6 33.6  RDW 13.5 13.4 13.8 14.1 14.2    Chemistries   Recent Labs Lab 03/10/15 2138  03/13/15 0541 03/13/15 1139 03/15/15 0638 03/16/15 0345 03/17/15 0730  NA  --   < > 134* 131* 132* 134* 133*  K  --   < > 4.4 4.2 4.2 3.7 3.5  CL  --   < > 104 99* 101 99* 96*  CO2  --   < > 14* 17* 21* 25 23  GLUCOSE  --   < > 75 92 114* 96 125*  BUN  --   < > 88* 86* 57* 34* 41*  CREATININE 6.13*  < > 6.34* 6.34* 5.36* 4.40* 5.36*  CALCIUM  --   < > 6.6* 6.5* 6.7* 7.1* 7.6*  AST 24  --   --   --   --   --   --   ALT 35  --   --   --   --   --   --   ALKPHOS 61  --   --   --   --   --   --   BILITOT 0.4  --   --   --   --   --   --   < > = values in this interval not displayed. ------------------------------------------------------------------------------------------------------------------ estimated creatinine clearance is 15.2 mL/min (by C-G formula based on Cr of 5.36). ------------------------------------------------------------------------------------------------------------------ No results for input(s): HGBA1C in  the last 72  hours. ------------------------------------------------------------------------------------------------------------------ No results for input(s): CHOL, HDL, LDLCALC, TRIG, CHOLHDL, LDLDIRECT in the last 72 hours. ------------------------------------------------------------------------------------------------------------------ No results for input(s): TSH, T4TOTAL, T3FREE, THYROIDAB in the last 72 hours.  Invalid input(s): FREET3 ------------------------------------------------------------------------------------------------------------------ No results for input(s): VITAMINB12, FOLATE, FERRITIN, TIBC, IRON, RETICCTPCT in the last 72 hours.  Coagulation profile  Recent Labs Lab 03/13/15 0541  INR 1.43    No results for input(s): DDIMER in the last 72 hours.  Cardiac Enzymes  Recent Labs Lab 03/11/15 2122 03/12/15 0500 03/12/15 1002  TROPONINI 0.04* 0.05* 0.03   ------------------------------------------------------------------------------------------------------------------ Invalid input(s): POCBNP    Owens Hara D.O. on 03/17/2015 at 1:58 PM  Between 7am to 7pm - Pager - [phone]  After 7pm go to [email or web address] - password [email or web address]  And look for the night coverage person covering for me after hours  [facility] Office  [phone]

## 2015-03-17 NOTE — Progress Notes (Signed)
Subjective:  Seen on HD- BP is high- had 525 of UOP  Objective Vital signs in last 24 hours: Filed Vitals:   03/17/15 0735 03/17/15 0800 03/17/15 0830 03/17/15 0900  BP: 162/92 172/98 172/102 176/102  Pulse: 74 72 72 75  Temp:      TempSrc:      Resp: 17 18 18 18   Height:      Weight:      SpO2:       Weight change: -0.253 kg (-8.9 oz)  Intake/Output Summary (Last 24 hours) at 03/17/15 [id number] Last data filed at 03/17/15 0900  Gross per 24 hour  Intake    598 ml  Output    525 ml  Net     73 ml    Assessment/ Plan: Pt is a 60 y.o. yo male with DM, HTN, CAD s/p CABG and ICD as well as hep c and cirrhosis who was admitted on 03/10/2015 with  volume overload and worsening renal function determined to now be at ESRD  Assessment/Plan: 1. Volume- CHF and failed diuretics due to advanced CKD- now requiring dialysis 2. ESRD - new diagnosis this admit- s/p first HD on Saturday- second treatment Monday - third today- plans for permanent access per [facility] later this week (Thursday?)- will start to make arrangements for OP dialysis- had tunneled PC placed 2/18- apparently had an issue with [place] starting dialysis too quickly for him- will plan on 4th tx for tomorrow 3. Anemia- advanced - iron stores low- will replete and start ESA- also got blood yest 4. Secondary hyperparathyroidism- check PTH- phos is 7.2 with low calc- added phoslo - now phos is 5 5. HTN/volume- UF as able with HD- anticipate will need downward titration of other HTN meds once volume is better 6. Malnutrition- hopefully will improve with initiation of HD although cirrhosis is likely not helping - added renavite    Markia Kyer A    Labs: Basic Metabolic Panel:  Recent Labs Lab 03/13/15 1139 03/15/15 0638 03/16/15 0345 03/17/15 0730  NA 131* 132* 134* 133*  K 4.2 4.2 3.7 3.5  CL 99* 101 99* 96*  CO2 17* 21* 25 23  GLUCOSE 92 114* 96 125*  BUN 86* 57* 34* 41*  CREATININE 6.34* 5.36* 4.40* 5.36*  CALCIUM 6.5*  6.7* 7.1* 7.6*  PHOS 7.2*  --   --  5.0*   Liver Function Tests:  Recent Labs Lab 03/10/15 2138 03/13/15 1139 03/17/15 0730  AST 24  --   --   ALT 35  --   --   ALKPHOS 61  --   --   BILITOT 0.4  --   --   PROT 8.0  --   --   ALBUMIN 1.8* 1.9* 1.9*   No results for input(s): LIPASE, AMYLASE in the last 168 hours. No results for input(s): AMMONIA in the last 168 hours. CBC:  Recent Labs Lab 03/13/15 1138 03/14/15 0313 03/15/15 0638 03/16/15 0345 03/17/15 0730  WBC 7.7 5.5 7.3 7.1 8.2  HGB 7.0* 6.3* 7.2* 8.8* 9.0*  HCT 21.7* 19.1* 21.4* 27.0* 26.8*  MCV 87.9 87.2 89.5 88.2 90.8  PLT 235 209 218 221 201   Cardiac Enzymes:  Recent Labs Lab 03/11/15 0125 03/11/15 0736 03/11/15 2122 03/12/15 0500 03/12/15 1002  TROPONINI 0.06* 0.05* 0.04* 0.05* 0.03   CBG:  Recent Labs Lab 03/16/15 1141 03/16/15 1643 03/16/15 2122 03/17/15 0534 03/17/15 0857  GLUCAP 222* 144* 154* 137* 99    Iron Studies: No results  for input(s): IRON, TIBC, TRANSFERRIN, FERRITIN in the last 72 hours. Studies/Results: No results found. Medications: Infusions:    Scheduled Medications: . aspirin EC  81 mg Oral Daily  . calcium acetate  1,334 mg Oral TID WC  . carvedilol  6.25 mg Oral BID WC  . Chlorhexidine Gluconate Cloth  6 each Topical Q0600  . cloNIDine  0.1 mg Oral TID  . darbepoetin (ARANESP) injection - NON-DIALYSIS  150 mcg Subcutaneous Q Fri-1800  . ferric gluconate (FERRLECIT/NULECIT) IV  125 mg Intravenous Daily  . heparin  5,000 Units Subcutaneous 3 times per day  . hydrALAZINE  25 mg Oral 3 times per day  . insulin aspart  0-9 Units Subcutaneous TID WC  . isosorbide mononitrate  60 mg Oral q morning - 10a  . multivitamin  1 tablet Oral QHS  . sodium chloride flush  3 mL Intravenous Q12H    have reviewed scheduled and prn medications.  Physical Exam: General: Odd affect Heart: RRR Lungs: mostly clear Abdomen: distended Extremities: pitting edema Dialysis  Access: right sided PC    03/17/2015,9:23 AM  LOS: 7 days

## 2015-03-17 NOTE — Procedures (Signed)
Patient was seen on dialysis and the procedure was supervised.  BFR 400  Via PC BP is  148/90.   Patient appears to be tolerating treatment well  Eric [name] A 03/17/2015

## 2015-03-17 NOTE — Progress Notes (Signed)
Pt a/o, no c/o pain, pt c/o cough PRN tessalon given as ordered, pt to go for HD in the am, VSS, pt stable, pt resting comfortably

## 2015-03-18 ENCOUNTER — Encounter (HOSPITAL_COMMUNITY)
Admission: EM | Disposition: A | Discharge status: Home / Self Care | Payer: Self-pay | Source: Home / Self Care | Attending: Internal Medicine

## 2015-03-18 ENCOUNTER — Other Ambulatory Visit: Payer: Self-pay | Admitting: *Deleted

## 2015-03-18 ENCOUNTER — Inpatient Hospital Stay (HOSPITAL_COMMUNITY): Payer: Medicaid Other

## 2015-03-18 ENCOUNTER — Inpatient Hospital Stay (HOSPITAL_COMMUNITY): Payer: Medicaid Other | Admitting: Anesthesiology

## 2015-03-18 DIAGNOSIS — N185 Chronic kidney disease, stage 5: Secondary | ICD-10-CM

## 2015-03-18 DIAGNOSIS — N186 End stage renal disease: Secondary | ICD-10-CM

## 2015-03-18 DIAGNOSIS — Z4931 Encounter for adequacy testing for hemodialysis: Secondary | ICD-10-CM

## 2015-03-18 HISTORY — PX: AV FISTULA PLACEMENT: SHX1204

## 2015-03-18 LAB — GLUCOSE, CAPILLARY
GLUCOSE-CAPILLARY: 125 mg/dL — AB (ref 65–99)
Glucose-Capillary: 119 mg/dL — ABNORMAL HIGH (ref 65–99)
Glucose-Capillary: 105 mg/dL — ABNORMAL HIGH (ref 65–99)
Glucose-Capillary: 192 mg/dL — ABNORMAL HIGH (ref 65–99)
Glucose-Capillary: 180 mg/dL — ABNORMAL HIGH (ref 65–99)

## 2015-03-18 LAB — RENAL FUNCTION PANEL
Albumin: 1.9 g/dL — ABNORMAL LOW (ref 3.5–5.0)
Anion gap: 11 (ref 5–15)
BUN: 25 mg/dL — AB (ref 6–20)
CALCIUM: 7.7 mg/dL — AB (ref 8.9–10.3)
CHLORIDE: 98 mmol/L — AB (ref 101–111)
CO2: 25 mmol/L (ref 22–32)
CREATININE: 4.18 mg/dL — AB (ref 0.61–1.24)
GFR calc Af Amer: 16 mL/min — ABNORMAL LOW (ref >60)
GFR, EST NON AFRICAN AMERICAN: 14 mL/min — AB (ref >60)
Glucose, Bld: 117 mg/dL — ABNORMAL HIGH (ref 65–99)
Phosphorus: 3.8 mg/dL (ref 2.5–4.6)
Potassium: 4.1 mmol/L (ref 3.5–5.1)
SODIUM: 134 mmol/L — AB (ref 135–145)

## 2015-03-18 LAB — BASIC METABOLIC PANEL
ANION GAP: 9 (ref 5–15)
BUN: 24 mg/dL — AB (ref 6–20)
CALCIUM: 7.8 mg/dL — AB (ref 8.9–10.3)
CO2: 25 mmol/L (ref 22–32)
Chloride: 97 mmol/L — ABNORMAL LOW (ref 101–111)
Creatinine, Ser: 4.09 mg/dL — ABNORMAL HIGH (ref 0.61–1.24)
GFR calc Af Amer: 17 mL/min — ABNORMAL LOW (ref >60)
GFR, EST NON AFRICAN AMERICAN: 14 mL/min — AB (ref >60)
GLUCOSE: 114 mg/dL — AB (ref 65–99)
Potassium: 4.2 mmol/L (ref 3.5–5.1)
Sodium: 131 mmol/L — ABNORMAL LOW (ref 135–145)

## 2015-03-18 LAB — CBC
HCT: 31 % — ABNORMAL LOW (ref 39.0–52.0)
HCT: 29.1 % — ABNORMAL LOW (ref 39.0–52.0)
Hemoglobin: 10.3 g/dL — ABNORMAL LOW (ref 13.0–17.0)
Hemoglobin: 9.5 g/dL — ABNORMAL LOW (ref 13.0–17.0)
MCH: 30.8 pg (ref 26.0–34.0)
MCH: 29.6 pg (ref 26.0–34.0)
MCHC: 33.2 g/dL (ref 30.0–36.0)
MCHC: 32.6 g/dL (ref 30.0–36.0)
MCV: 92.8 fL (ref 78.0–100.0)
MCV: 90.7 fL (ref 78.0–100.0)
PLATELETS: 226 10*3/uL (ref 150–400)
PLATELETS: 203 10*3/uL (ref 150–400)
RBC: 3.34 MIL/uL — ABNORMAL LOW (ref 4.22–5.81)
RBC: 3.21 MIL/uL — ABNORMAL LOW (ref 4.22–5.81)
RDW: 14.4 % (ref 11.5–15.5)
RDW: 14.3 % (ref 11.5–15.5)
WBC: 10.9 10*3/uL — ABNORMAL HIGH (ref 4.0–10.5)
WBC: 9.2 10*3/uL (ref 4.0–10.5)

## 2015-03-18 LAB — TROPONIN I: Troponin I: 0.03 ng/mL (ref <0.031)

## 2015-03-18 LAB — SURGICAL PCR SCREEN
MRSA, PCR: NEGATIVE
Staphylococcus aureus: NEGATIVE

## 2015-03-18 SURGERY — ARTERIOVENOUS (AV) FISTULA CREATION
Anesthesia: Monitor Anesthesia Care | Site: Arm Lower | Laterality: Right

## 2015-03-18 MED ORDER — PROTAMINE SULFATE 10 MG/ML IV SOLN
INTRAVENOUS | Status: DC | PRN
  Administered 2015-03-18: 40 mg via INTRAVENOUS

## 2015-03-18 MED ORDER — HEPARIN SODIUM (PORCINE) 1000 UNIT/ML IJ SOLN
INTRAMUSCULAR | Status: DC | PRN
  Administered 2015-03-18: 7000 [IU] via INTRAVENOUS

## 2015-03-18 MED ORDER — HEPARIN SODIUM (PORCINE) 1000 UNIT/ML DIALYSIS: 20.0000 [IU]/kg | INTRAMUSCULAR | Status: DC | PRN

## 2015-03-18 MED ORDER — FENTANYL CITRATE (PF) 250 MCG/5ML IJ SOLN
INTRAMUSCULAR | Status: AC
  Filled 2015-03-18: qty 5

## 2015-03-18 MED ORDER — SODIUM CHLORIDE 0.9 % IV SOLN
INTRAVENOUS | Status: DC | PRN
  Administered 2015-03-18: 500 mL

## 2015-03-18 MED ORDER — MIDAZOLAM HCL 5 MG/5ML IJ SOLN
INTRAMUSCULAR | Status: DC | PRN
  Administered 2015-03-18 (×2): 1 mg via INTRAVENOUS

## 2015-03-18 MED ORDER — HYDRALAZINE HCL 20 MG/ML IJ SOLN: 10.0000 mg | Freq: Four times a day (QID) | INTRAMUSCULAR | Status: DC | PRN

## 2015-03-18 MED ORDER — 0.9 % SODIUM CHLORIDE (POUR BTL) OPTIME
TOPICAL | Status: DC | PRN
  Administered 2015-03-18: 1000 mL

## 2015-03-18 MED ORDER — CALCITRIOL 0.25 MCG PO CAPS
0.2500 ug | ORAL_CAPSULE | Freq: Every day | ORAL | Status: DC
  Administered 2015-03-18 – 2015-03-20 (×3): 0.25 ug via ORAL
  Filled 2015-03-18 (×4): qty 1

## 2015-03-18 MED ORDER — MIDAZOLAM HCL 2 MG/2ML IJ SOLN
INTRAMUSCULAR | Status: AC
  Filled 2015-03-18: qty 2

## 2015-03-18 MED ORDER — THROMBIN 20000 UNITS EX SOLR
CUTANEOUS | Status: AC
  Filled 2015-03-18: qty 20000

## 2015-03-18 MED ORDER — PROPOFOL 10 MG/ML IV BOLUS
INTRAVENOUS | Status: AC
  Filled 2015-03-18: qty 20

## 2015-03-18 MED ORDER — FENTANYL CITRATE (PF) 100 MCG/2ML IJ SOLN
INTRAMUSCULAR | Status: DC | PRN
  Administered 2015-03-18: 50 ug via INTRAVENOUS

## 2015-03-18 MED ORDER — LIDOCAINE-EPINEPHRINE (PF) 1 %-1:200000 IJ SOLN
INTRAMUSCULAR | Status: AC
  Filled 2015-03-18: qty 30

## 2015-03-18 MED ORDER — CEFAZOLIN SODIUM-DEXTROSE 2-3 GM-% IV SOLR
INTRAVENOUS | Status: DC | PRN
  Administered 2015-03-18: 2 g via INTRAVENOUS

## 2015-03-18 MED ORDER — LIDOCAINE HCL (PF) 1 % IJ SOLN
INTRAMUSCULAR | Status: AC
  Filled 2015-03-18: qty 30

## 2015-03-18 MED ORDER — HYDROMORPHONE HCL 1 MG/ML IJ SOLN: 0.2500 mg | INTRAMUSCULAR | Status: DC | PRN

## 2015-03-18 MED ORDER — LIDOCAINE HCL (PF) 1 % IJ SOLN
INTRAMUSCULAR | Status: DC | PRN
  Administered 2015-03-18: 16 mL

## 2015-03-18 SURGICAL SUPPLY — 31 items
ARMBAND PINK RESTRICT EXTREMIT (MISCELLANEOUS) ×3 IMPLANT
CANISTER SUCTION 2500CC (MISCELLANEOUS) ×3 IMPLANT
CANNULA VESSEL 3MM 2 BLNT TIP (CANNULA) ×3 IMPLANT
CLIP TI MEDIUM 6 (CLIP) ×3 IMPLANT
CLIP TI WIDE RED SMALL 6 (CLIP) ×6 IMPLANT
COVER PROBE W GEL 5X96 (DRAPES) IMPLANT
DECANTER SPIKE VIAL GLASS SM (MISCELLANEOUS) ×3 IMPLANT
ELECT REM PT RETURN 9FT ADLT (ELECTROSURGICAL) ×3
ELECTRODE REM PT RTRN 9FT ADLT (ELECTROSURGICAL) ×1 IMPLANT
GLOVE BIO SURGEON STRL SZ7.5 (GLOVE) ×3 IMPLANT
GLOVE BIO SURGEON STRL SZ8.5 (GLOVE) ×3 IMPLANT
GLOVE BIOGEL PI IND STRL 6.5 (GLOVE) ×1 IMPLANT
GLOVE BIOGEL PI IND STRL 8 (GLOVE) ×1 IMPLANT
GLOVE BIOGEL PI INDICATOR 6.5 (GLOVE) ×2
GLOVE BIOGEL PI INDICATOR 8 (GLOVE) ×2
GLOVE ECLIPSE 7.0 STRL STRAW (GLOVE) ×3 IMPLANT
GOWN STRL REUS W/ TWL LRG LVL3 (GOWN DISPOSABLE) ×3 IMPLANT
GOWN STRL REUS W/TWL LRG LVL3 (GOWN DISPOSABLE) ×6
KIT BASIN OR (CUSTOM PROCEDURE TRAY) ×3 IMPLANT
KIT ROOM TURNOVER OR (KITS) ×3 IMPLANT
LIQUID BAND (GAUZE/BANDAGES/DRESSINGS) ×3 IMPLANT
NS IRRIG 1000ML POUR BTL (IV SOLUTION) ×3 IMPLANT
PACK CV ACCESS (CUSTOM PROCEDURE TRAY) ×3 IMPLANT
PAD ARMBOARD 7.5X6 YLW CONV (MISCELLANEOUS) ×6 IMPLANT
SPONGE SURGIFOAM ABS GEL 100 (HEMOSTASIS) IMPLANT
SUT PROLENE 6 0 BV (SUTURE) ×6 IMPLANT
SUT VIC AB 3-0 SH 27 (SUTURE) ×2
SUT VIC AB 3-0 SH 27X BRD (SUTURE) ×1 IMPLANT
SUT VICRYL 4-0 PS2 18IN ABS (SUTURE) ×3 IMPLANT
UNDERPAD 30X30 INCONTINENT (UNDERPADS AND DIAPERS) ×3 IMPLANT
WATER STERILE IRR 1000ML POUR (IV SOLUTION) ×3 IMPLANT

## 2015-03-18 NOTE — Anesthesia Postprocedure Evaluation (Signed)
Anesthesia Post Note  Patient: Eric [name]  Procedure(s) Performed: Procedure(s) (LRB): BRACHIOCEPHALIC ARTERIOVENOUS (AV) FISTULA CREATION (Right)  Patient location during evaluation: PACU Anesthesia Type: MAC Level of consciousness: awake and alert Pain management: pain level controlled Vital Signs Assessment: post-procedure vital signs reviewed and stable Respiratory status: spontaneous breathing, nonlabored ventilation and respiratory function stable Cardiovascular status: stable and blood pressure returned to baseline Anesthetic complications: no    Last Vitals:  Filed Vitals:   03/18/15 0948 03/18/15 1009  BP: 140/78 146/75  Pulse:  74  Temp:  36.8 C  Resp:  16    Last Pain:  Filed Vitals:   03/18/15 1010  PainSc: 0-No pain                 Marketta Valadez,W. EDMOND

## 2015-03-18 NOTE — Op Note (Signed)
    NAME: [name]   MRN: [id number] DOB: 03-05-1955    DATE OF OPERATION: 03/18/2015  PREOP DIAGNOSIS: Stage V chronic kidney disease  POSTOP DIAGNOSIS: Same  PROCEDURE: Right brachial cephalic AV fistula  SURGEON: Judeth Cornfield. Scot Dock, MD, FACS  ASSIST: Gerri Lins PA  ANESTHESIA: Local with sedation   EBL: Minimal  INDICATIONS: Eric [name] is a 60 y.o. male Who presents for new access. He has a functioning catheter. He has an AICD on the left.  FINDINGS: 3.5 mm cephalic vein at the antecubital level.  TECHNIQUE: The patient was taken to the operating room and sedated by anesthesia. The right upper extremity was prepped and draped in usual sterile fashion. After the skin was anesthetized with 1% lidocaine, the transverse incision was made at the antecubital level and the cephalic vein here was dissected free. The vein was ligated distally and irrigated up nicely with heparinized saline. The brachial artery was dissected free beneath the fascia. The patient was heparinized. The brachial artery was clamped proximally and distally and a longitudinal arteriotomy was made. The vein was sewn into side to the artery using continuous 6-0 Prolene suture. At the completion was a good thrill in the fistula. The vein was mobilized further proximally to free it up from the fascia. Hemostasis was obtained in the wound and the heparin was partially reversed with protamine. The wound was closed with a deeper 3-0 Vicryl and the skin closed with 4-0 Vicryl. Liquid band was applied. The patient tolerated the procedure well and was transferred to the recovery room in stable condition. All needle and sponge counts were correct.  Deitra Mayo, MD, FACS [facility]  DATE OF DICTATION:   03/18/2015

## 2015-03-18 NOTE — Transfer of Care (Signed)
Immediate Anesthesia Transfer of Care Note  Patient: Eric [name]  Procedure(s) Performed: Procedure(s): BRACHIOCEPHALIC ARTERIOVENOUS (AV) FISTULA CREATION (Right)  Patient Location: PACU  Anesthesia Type:MAC  Level of Consciousness: awake, alert , oriented and patient cooperative  Airway & Oxygen Therapy: Patient Spontanous Breathing  Post-op Assessment: Report given to RN, Post -op Vital signs reviewed and stable and Patient moving all extremities  Post vital signs: Reviewed and stable  Last Vitals:  Filed Vitals:   03/17/15 2010 03/18/15 0415  BP: 155/76 174/91  Pulse: 77 76  Temp: 37.2 C 37.2 C  Resp: 18 18    Complications: No apparent anesthesia complications

## 2015-03-18 NOTE — Progress Notes (Signed)
Pt a/o, no c/o pain, pt c/o cough PRN Tessalon given as ordered, pt then stated "he still has cough" 1 x Tussinex ordered, pt asleep, med held, VSS, pt resting comfortably, pt NPO for fistula placement in am, VSS, pt stable

## 2015-03-18 NOTE — Significant Event (Signed)
Rapid Response Event Note  Overview: Time Called: [id number] Arrival Time: 1552 Event Type: Unknown  Initial Focused Assessment: Called Stat to patients room for inability to move his extremities.  Upon my arrival to patients room, RN and staff at bedside.  Patient is lying in bed, tearful and holding arms in the air, and states he can't move his legs.  Patient was able to lift his legs off the bed .  Patient alert and oriented, Patient can feel touch on extremities, no facial droop noted, states he has pain 8/10 all over body.    Interventions: 181/94, HR 97, 20, sat 98% on ra.  Placed on 1 lpm nasal cannula, EKG done.  MD paged and orders received. MD at bedside.   Event Summary:  RN to call if assistance needed   at      at          [facility], Harlin Rain

## 2015-03-18 NOTE — Progress Notes (Signed)
Subjective:  S/p AVF- doing well  BP is still highish but better - had 575 of UOP  Objective Vital signs in last 24 hours: Filed Vitals:   03/17/15 2010 03/18/15 0415 03/18/15 0935 03/18/15 0948  BP: 155/76 174/91 149/76 140/78  Pulse: 77 76 76   Temp: 98.9 F (37.2 C) 99 F (37.2 C) 98.3 F (36.8 C)   TempSrc: Oral Oral    Resp: 18 18 20    Height:      Weight:  87.68 kg (193 lb 4.8 oz)    SpO2: 96% 95% 95% 96%   Weight change: -0.047 kg (-1.7 oz)  Intake/Output Summary (Last 24 hours) at 03/18/15 1002 Last data filed at 03/18/15 [id number]  Gross per 24 hour  Intake    500 ml  Output   4630 ml  Net  -4130 ml    Assessment/ Plan: Pt is a 60 y.o. yo male with DM, HTN, CAD s/p CABG and ICD as well as hep c and cirrhosis who was admitted on 03/10/2015 with  volume overload and worsening renal function determined to now be at ESRD  Assessment/Plan: 1. Volume- CHF and failed diuretics due to advanced CKD- now requiring dialysis 2. ESRD - new diagnosis this admit- s/p first HD on Saturday- 2nd Monday - 3rd yest- s/p AVF today-  OP dialysis arrangements pending-  tunneled PC placed 2/18-  4th tx for tomorrow 3. Anemia- advanced - iron stores low- will replete and start ESA- also received blood this admit 4. Secondary hyperparathyroidism-  PTH 465- add vit D- phos was 7.2 with low calc- added phoslo - now phos is 5 5. HTN/volume- UF as able with HD- anticipate will need downward titration of other HTN meds once volume is better 6. Malnutrition- hopefully will improve with initiation of HD although cirrhosis is likely not helping - added renavite    Eric [name] A    Labs: Basic Metabolic Panel:  Recent Labs Lab 03/13/15 1139  03/16/15 0345 03/17/15 0730 03/18/15 0538  NA 131*  < > 134* 133* 131*  K 4.2  < > 3.7 3.5 4.2  CL 99*  < > 99* 96* 97*  CO2 17*  < > 25 23 25   GLUCOSE 92  < > 96 125* 114*  BUN 86*  < > 34* 41* 24*  CREATININE 6.34*  < > 4.40* 5.36* 4.09*   CALCIUM 6.5*  < > 7.1* 7.6* 7.8*  PHOS 7.2*  --   --  5.0*  --   < > = values in this interval not displayed. Liver Function Tests:  Recent Labs Lab 03/13/15 1139 03/17/15 0730  ALBUMIN 1.9* 1.9*   No results for input(s): LIPASE, AMYLASE in the last 168 hours. No results for input(s): AMMONIA in the last 168 hours. CBC:  Recent Labs Lab 03/14/15 0313 03/15/15 0638 03/16/15 0345 03/17/15 0730 03/18/15 0538  WBC 5.5 7.3 7.1 8.2 10.9*  HGB 6.3* 7.2* 8.8* 9.0* 10.3*  HCT 19.1* 21.4* 27.0* 26.8* 31.0*  MCV 87.2 89.5 88.2 90.8 92.8  PLT 209 218 221 201 226   Cardiac Enzymes:  Recent Labs Lab 03/11/15 2122 03/12/15 0500 03/12/15 1002  TROPONINI 0.04* 0.05* 0.03   CBG:  Recent Labs Lab 03/17/15 1218 03/17/15 1629 03/17/15 2111 03/18/15 0558 03/18/15 0937  GLUCAP 219* 305* 129* 119* 105*    Iron Studies: No results for input(s): IRON, TIBC, TRANSFERRIN, FERRITIN in the last 72 hours. Studies/Results: No results found. Medications: Infusions:    Scheduled Medications: Marland Kitchen [  MAR Hold] aspirin EC  81 mg Oral Daily  . [MAR Hold] calcium acetate  1,334 mg Oral TID WC  . [MAR Hold] carvedilol  6.25 mg Oral BID WC  . [MAR Hold] Chlorhexidine Gluconate Cloth  6 each Topical Q0600  . chlorpheniramine-HYDROcodone  5 mL Oral Once  . [MAR Hold] cloNIDine  0.1 mg Oral TID  . [MAR Hold] darbepoetin (ARANESP) injection - NON-DIALYSIS  150 mcg Subcutaneous Q Fri-1800  . [MAR Hold] ferric gluconate (FERRLECIT/NULECIT) IV  125 mg Intravenous Daily  . [MAR Hold] heparin  5,000 Units Subcutaneous 3 times per day  . [MAR Hold] hydrALAZINE  25 mg Oral 3 times per day  . [MAR Hold] insulin aspart  0-9 Units Subcutaneous TID WC  . [MAR Hold] isosorbide mononitrate  60 mg Oral q morning - 10a  . [MAR Hold] multivitamin  1 tablet Oral QHS  . [MAR Hold] sodium chloride flush  3 mL Intravenous Q12H    have reviewed scheduled and prn medications.  Physical Exam: General: Odd  affect Heart: RRR Lungs: mostly clear Abdomen: distended Extremities: pitting edema Dialysis Access: right sided PC and new AVF with thrill right upper arm    03/18/2015,10:02 AM  LOS: 8 days

## 2015-03-18 NOTE — H&P (View-Only) (Signed)
   Daily Progress Note  Earliest can get patient on schedule is this coming Thursday for R RC vs BC AVF.  Will discuss with patient today/tomorrow.  Adele Barthel, MD [facility] [place] Office: [phone] Pager: [phone]  03/16/2015, 7:33 AM

## 2015-03-18 NOTE — Interval H&P Note (Signed)
History and Physical Interval Note:  03/18/2015 7:23 AM  Eric [name]  has presented today for surgery, with the diagnosis of End Stage Renal Disease N18.6  The various methods of treatment have been discussed with the patient and family. After consideration of risks, benefits and other options for treatment, the patient has consented to  Procedure(s): RADIOCEPHALIC VERSUS BRACHIOCEPHALIC ARTERIOVENOUS (AV) FISTULA CREATION (Right) as a surgical intervention .  The patient's history has been reviewed, patient examined, no change in status, stable for surgery.  I have reviewed the patient's chart and labs.  Questions were answered to the patient's satisfaction.     Deitra Mayo

## 2015-03-18 NOTE — Progress Notes (Signed)
[facility]                                                                              Patient Demographics  Eric [name], is a 60 y.o. male, DOB - [birth year], RE:[id number]  Admit date - 03/10/2015   Admitting Physician Waldemar Dickens, MD  Outpatient Primary MD for the patient is [name], [name], NP  LOS - 8   Chief Complaint  Patient presents with  . Chest Pain  . Shortness of Breath      HPI on 03/10/2015 by Ms. Elmo Putt, NP Eric [name] is a 60 y.o. 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 Pt presents today with complaints of chest pain and shortness of breath for several days becoming increasingly worse prompting visit to ED.   Pt states he is compliant with home meds. He denies recent fever, dizziness or palpitations, abdominal pain, n/v/d. He states he does feel constipated. ED evaluation consistent with acute HF exacerbation, creatinine up to 5.9 from baseline around 3, Hgb 7.3. Pt to be admitted for further evaluation and treatment.    Assessment & Plan   Acute on chronic systolic and diastolic CHF -BNP [id number] -Echocardiogram: EF 45-50%, left ventricular diastolic function parameters are normal  -Cardiology consulted and appreciated -Nephrology consulted and appreciated, patient started on hemodialysis and has lost several liters of fluid -Continue indoor, Coreg (no ACEI/ARB due to renal failure)  Acute on chronic anemia disease, stage III, progressing to ESRD -Patient does have uncontrolled diabetes and hypertensive nephropathy -Baseline creatinine approximately 3, 5 on admission -No significant improvement with IV diuresis -Nephrology and vascular surgery consulted and appreciated -Patient currently has temporary femoral dialysis catheter in place -s/p Right brachial cephalic AVF by vascular  surgery  Normocytic anemia -Hemoccult negative -Continue panel consistent with anemia of chronic disease -Continue Aranesp -Monitor CBC  Chest pain/mildly elevated troponin -Currently chest pain-free -Troponin mildly elevated secondary to the above -Cardiology was consulted -Continue aspirin  Diabetes mellitus, type II -Hemoglobin A1c 6.2 in June 2016 -Continue insulin sliding scale CBG monitoring  Hypertension -Continue Coreg, clonidine, hydralazine  History of cardiac arrest -Status post ICD placement  Hepatitis C/cirrhosis -LFTs normal  MGUS -Patient follows up with Dr. Marin Olp  Code Status: Full  Family Communication: None at bedside  Disposition Plan: Admitted. Pending outpatient HD slot.  Time Spent in minutes   30 minutes  Procedures  Echocardiogram Clinical dialysis catheter placed on 03/13/15 Right brachial cephalic AVF  Consults   Cardiology Nephrology Vascular surgery  DVT Prophylaxis  heparin  Lab Results  Component Value Date   PLT 203 03/18/2015    Medications  Scheduled Meds: . aspirin EC  81 mg Oral Daily  . calcitRIOL  0.25 mcg Oral Daily  . calcium acetate  1,334 mg Oral TID WC  . carvedilol  6.25 mg Oral BID WC  . Chlorhexidine Gluconate Cloth  6 each Topical Q0600  . chlorpheniramine-HYDROcodone  5 mL Oral Once  . cloNIDine  0.1 mg Oral TID  . darbepoetin (ARANESP) injection - NON-DIALYSIS  150 mcg Subcutaneous Q Fri-1800  . ferric gluconate (FERRLECIT/NULECIT) IV  125 mg Intravenous Daily  . heparin  5,000 Units Subcutaneous 3 times per day  . hydrALAZINE  25 mg Oral 3 times per day  . insulin aspart  0-9 Units Subcutaneous TID WC  . isosorbide mononitrate  60 mg Oral q morning - 10a  . multivitamin  1 tablet Oral QHS  . sodium chloride flush  3 mL Intravenous Q12H   Continuous Infusions:  PRN Meds:.sodium chloride, sodium chloride, acetaminophen, alteplase, benzonatate, heparin, heparin, heparin, heparin, lidocaine (PF),  lidocaine-prilocaine, methocarbamol, nitroGLYCERIN, ondansetron (ZOFRAN) IV, pentafluoroprop-tetrafluoroeth, sodium chloride flush, traMADol  Antibiotics    Anti-infectives    Start     Dose/Rate Route Frequency Ordered Stop   03/13/15 0700  cefUROXime (ZINACEF) 1.5 g in dextrose 5 % 50 mL IVPB     1.5 g 100 mL/hr over 30 Minutes Intravenous On call to O.R. 03/13/15 0026 03/13/15 0740   03/13/15 0600  cefUROXime (ZINACEF) 1.5 g in dextrose 5 % 50 mL IVPB  Status:  Discontinued     1.5 g 100 mL/hr over 30 Minutes Intravenous On call to O.R. 03/13/15 0026 03/13/15 0029      Subjective:   Eric [name] seen and examined today.  Patient has no complaints today.  States his breathing has improved.  Denies chest pain, abdominal pain, nausea or vomiting, dizziness or headache.  Feels very hungry.  Objective:   Filed Vitals:   03/18/15 0935 03/18/15 0948 03/18/15 1009 03/18/15 1152  BP: 149/76 140/78 146/75 130/96  Pulse: 76  74 78  Temp: 98.3 F (36.8 C)  98.2 F (36.8 C) 97.2 F (36.2 C)  TempSrc:   Oral Oral  Resp: 20  16 16   Height:      Weight:      SpO2: 95% 96% 96% 97%    Wt Readings from Last 3 Encounters:  03/18/15 87.68 kg (193 lb 4.8 oz)  08/08/14 82.827 kg (182 lb 9.6 oz)  07/24/14 87.091 kg (192 lb)     Intake/Output Summary (Last 24 hours) at 03/18/15 1253 Last data filed at 03/18/15 1049  Gross per 24 hour  Intake    520 ml  Output    625 ml  Net   -105 ml    Exam  General: Well developed, well nourished, NAD  HEENT: NCAT, mucous membranes moist.   Cardiovascular: S1 S2 auscultated, RRR, no murmurs  Respiratory: Clear to auscultation   Abdomen: Soft, nontender, nondistended, + bowel sounds  Extremities: warm dry without cyanosis clubbing, 2+ edema in LE B/L-improving   Neuro: AAOx3, nonfocal  Psych: Normal affect and demeanor, pleasant   Data Review   Micro Results Recent Results (from the past 240 hour(s))  Surgical pcr screen      Status: None   Collection Time: 03/13/15  5:11 AM  Result Value Ref Range Status   MRSA, PCR NEGATIVE NEGATIVE Final   Staphylococcus aureus NEGATIVE NEGATIVE Final    Comment:        The Xpert SA Assay (FDA approved for NASAL specimens in patients over 68 years of age), is one component of a comprehensive surveillance program.  Test performance has been validated by [facility] for patients greater than or equal to 80 year old. It is not intended to diagnose infection nor to guide or monitor treatment.   Surgical pcr screen     Status: None   Collection Time: 03/18/15  3:53 AM  Result Value  Ref Range Status   MRSA, PCR NEGATIVE NEGATIVE Final   Staphylococcus aureus NEGATIVE NEGATIVE Final    Comment:        The Xpert SA Assay (FDA approved for NASAL specimens in patients over 79 years of age), is one component of a comprehensive surveillance program.  Test performance has been validated by [facility] for patients greater than or equal to 31 year old. It is not intended to diagnose infection nor to guide or monitor treatment.     Radiology Reports Dg Chest 2 View  03/10/2015  CLINICAL DATA:  Cough with shortness of breath for a few weeks. History of hypertension and diabetes. EXAM: CHEST  2 VIEW COMPARISON:  Radiographs 06/29/2014 and 08/03/2014. FINDINGS: Left subclavian AICD lead appears unchanged at the right ventricular apex. There is stable mild cardiac enlargement status post median sternotomy and CABG. Compared with the most recent study, the bilateral airspace opacities have partially cleared. There are residual opacities suspicious for edema. No significant residual pleural fluid. A small asymmetric nodular density inferiorly on the right on the frontal examination may reflect a nipple shadow. The bones appear unchanged. IMPRESSION: Recurrent congestive heart failure with pulmonary edema, less advanced than on the most recent study. Possible nipple shadow projecting  over the right lung base; attention on follow-up after the edema has resolved recommended. Electronically Signed   By: Richardean Sale M.D.   On: 03/10/2015 12:51   Dg Chest Port 1 View  03/13/2015  CLINICAL DATA:  End-stage renal disease, on dialysis.  Ex-smoker. EXAM: PORTABLE CHEST 1 VIEW COMPARISON:  03/10/2015. FINDINGS: Progressive enlargement of the cardiac silhouette. Increased prominence of the pulmonary vasculature and interstitial markings. Possible small right pleural effusion. Interval right jugular double-lumen catheter with 1 lumen tip in the inferior aspect of the superior vena cava and the other lumen tip at the superior cavoatrial junction or upper right atrium. No pneumothorax. Stable left subclavian AICD lead and median sternotomy wires. Unremarkable bones. IMPRESSION: Mild worsening of changes of congestive heart failure. Electronically Signed   By: Claudie Revering M.D.   On: 03/13/2015 09:35   Dg Fluoro Guide Cv Line-no Report  03/13/2015  CLINICAL DATA:  FLOURO GUIDE CV LINE Fluoroscopy was utilized by the requesting physician.  No radiographic interpretation.    CBC  Recent Labs Lab 03/15/15 0638 03/16/15 0345 03/17/15 0730 03/18/15 0538 03/18/15 1026  WBC 7.3 7.1 8.2 10.9* 9.2  HGB 7.2* 8.8* 9.0* 10.3* 9.5*  HCT 21.4* 27.0* 26.8* 31.0* 29.1*  PLT 218 221 201 226 203  MCV 89.5 88.2 90.8 92.8 90.7  MCH 30.1 28.8 30.5 30.8 29.6  MCHC 33.6 32.6 33.6 33.2 32.6  RDW 13.8 14.1 14.2 14.4 14.3    Chemistries   Recent Labs Lab 03/15/15 0638 03/16/15 0345 03/17/15 0730 03/18/15 0538 03/18/15 1026  NA 132* 134* 133* 131* 134*  K 4.2 3.7 3.5 4.2 4.1  CL 101 99* 96* 97* 98*  CO2 21* 25 23 25 25   GLUCOSE 114* 96 125* 114* 117*  BUN 57* 34* 41* 24* 25*  CREATININE 5.36* 4.40* 5.36* 4.09* 4.18*  CALCIUM 6.7* 7.1* 7.6* 7.8* 7.7*   ------------------------------------------------------------------------------------------------------------------ estimated creatinine  clearance is 19.3 mL/min (by C-G formula based on Cr of 4.18). ------------------------------------------------------------------------------------------------------------------ No results for input(s): HGBA1C in the last 72 hours. ------------------------------------------------------------------------------------------------------------------ No results for input(s): CHOL, HDL, LDLCALC, TRIG, CHOLHDL, LDLDIRECT in the last 72 hours. ------------------------------------------------------------------------------------------------------------------ No results for input(s): TSH, T4TOTAL, T3FREE, THYROIDAB in the last 72 hours.  Invalid input(s): FREET3 ------------------------------------------------------------------------------------------------------------------ No results for input(s): VITAMINB12, FOLATE, FERRITIN, TIBC, IRON, RETICCTPCT in the last 72 hours.  Coagulation profile  Recent Labs Lab 03/13/15 0541  INR 1.43    No results for input(s): DDIMER in the last 72 hours.  Cardiac Enzymes  Recent Labs Lab 03/11/15 2122 03/12/15 0500 03/12/15 1002  TROPONINI 0.04* 0.05* 0.03   ------------------------------------------------------------------------------------------------------------------ Invalid input(s): POCBNP    Korrin Waterfield D.O. on 03/18/2015 at 12:53 PM  Between 7am to 7pm - Pager - [phone]  After 7pm go to [email or web address] - password [email or web address]  And look for the night coverage person covering for me after hours  [facility] Office  [phone]

## 2015-03-18 NOTE — Anesthesia Preprocedure Evaluation (Addendum)
Anesthesia Evaluation  Patient identified by MRN, date of birth, ID band Patient awake    Reviewed: Allergy & Precautions, H&P , NPO status , Patient's Chart, lab work & pertinent test results  Airway Mallampati: II  TM Distance: >3 FB Neck ROM: Full    Dental no notable dental hx. (+) Teeth Intact, Poor Dentition, Dental Advisory Given   Pulmonary neg pulmonary ROS, former smoker,    Pulmonary exam normal breath sounds clear to auscultation       Cardiovascular hypertension, Pt. on medications and Pt. on home beta blockers + Past MI and +CHF  + Cardiac Defibrillator + Valvular Problems/Murmurs MR  Rhythm:Regular Rate:Normal     Neuro/Psych negative neurological ROS  negative psych ROS   GI/Hepatic negative GI ROS, Neg liver ROS,   Endo/Other  diabetes, Type 2, Oral Hypoglycemic Agents  Renal/GU ESRF and DialysisRenal disease  negative genitourinary   Musculoskeletal   Abdominal   Peds  Hematology negative hematology ROS (+)   Anesthesia Other Findings   Reproductive/Obstetrics negative OB ROS                           Anesthesia Physical Anesthesia Plan  ASA: III  Anesthesia Plan: MAC   Post-op Pain Management:    Induction: Intravenous  Airway Management Planned: Simple Face Mask  Additional Equipment:   Intra-op Plan:   Post-operative Plan:   Informed Consent: I have reviewed the patients History and Physical, chart, labs and discussed the procedure including the risks, benefits and alternatives for the proposed anesthesia with the patient or authorized representative who has indicated his/her understanding and acceptance.   Dental advisory given  Plan Discussed with: CRNA  Anesthesia Plan Comments:        Anesthesia Quick Evaluation

## 2015-03-18 NOTE — Progress Notes (Addendum)
1553 called to pt room for c/o of sudden inability to move arms/legs. Pt can move fingers S/P R AV fistula placement. Spoke with Dr. Scot Dock who was in surgery and was instructed to page primary MD. Paged primary MD. VS 181/94, 90, 20, 94% RA. Rapid paged and arrived to room at 1556. Spoke with Dr. Ree Kida who gave orders for STAT EKG, Troponin, CT head & chest. Patient can move fingers but not arms, legs and toes. Patient states he cannot move anything, but still has sensation in legs. Pulses +2 radial and +2 pedal. Patient tearful and upset. Will continue to monitor.

## 2015-03-19 ENCOUNTER — Encounter (HOSPITAL_COMMUNITY): Payer: Self-pay | Admitting: Vascular Surgery

## 2015-03-19 ENCOUNTER — Telehealth: Payer: Self-pay | Admitting: Vascular Surgery

## 2015-03-19 LAB — GLUCOSE, CAPILLARY
GLUCOSE-CAPILLARY: 155 mg/dL — AB (ref 65–99)
GLUCOSE-CAPILLARY: 142 mg/dL — AB (ref 65–99)
GLUCOSE-CAPILLARY: 172 mg/dL — AB (ref 65–99)
Glucose-Capillary: 106 mg/dL — ABNORMAL HIGH (ref 65–99)

## 2015-03-19 LAB — CBC
HEMATOCRIT: 29.3 % — AB (ref 39.0–52.0)
HEMOGLOBIN: 9.7 g/dL — AB (ref 13.0–17.0)
MCH: 31 pg (ref 26.0–34.0)
MCHC: 33.1 g/dL (ref 30.0–36.0)
MCV: 93.6 fL (ref 78.0–100.0)
Platelets: 191 10*3/uL (ref 150–400)
RBC: 3.13 MIL/uL — ABNORMAL LOW (ref 4.22–5.81)
RDW: 14.8 % (ref 11.5–15.5)
WBC: 7.8 10*3/uL (ref 4.0–10.5)

## 2015-03-19 LAB — BASIC METABOLIC PANEL
ANION GAP: 9 (ref 5–15)
BUN: 32 mg/dL — ABNORMAL HIGH (ref 6–20)
CHLORIDE: 99 mmol/L — AB (ref 101–111)
CO2: 23 mmol/L (ref 22–32)
Calcium: 7.9 mg/dL — ABNORMAL LOW (ref 8.9–10.3)
Creatinine, Ser: 4.91 mg/dL — ABNORMAL HIGH (ref 0.61–1.24)
GFR calc non Af Amer: 12 mL/min — ABNORMAL LOW (ref >60)
GFR, EST AFRICAN AMERICAN: 13 mL/min — AB (ref >60)
Glucose, Bld: 117 mg/dL — ABNORMAL HIGH (ref 65–99)
Potassium: 4 mmol/L (ref 3.5–5.1)
Sodium: 131 mmol/L — ABNORMAL LOW (ref 135–145)

## 2015-03-19 NOTE — Telephone Encounter (Signed)
-----   Message from Mena Goes, RN sent at 03/18/2015  9:46 AM EST ----- Regarding: schedule   ----- Message -----    From: Angelia Mould, MD    Sent: 03/18/2015   9:25 AM      To: [facility] Charge Pool Subject: charge and f/u                                 PROCEDURE: Right brachial cephalic AV fistula  SURGEON: Judeth Cornfield. Scot Dock, MD, FACS  ASSIST: Gerri Lins PA  He will need a f/u visit and duplex in 6 weeks.  Thanks CD

## 2015-03-19 NOTE — Progress Notes (Addendum)
  [facility] Progress Note  Subjective  - POD #1   Says his right arm and right leg hurts since surgery. Has had chronic numbness of right hand secondary to neuropathy. Denies any changes in numbness since surgery.   Objective Filed Vitals:   03/19/15 0423 03/19/15 0944  BP: 156/77 169/84  Pulse: 73 76  Temp: 97.8 F (36.6 C) 98.5 F (36.9 C)  Resp: 20 18    Intake/Output Summary (Last 24 hours) at 03/19/15 0959 Last data filed at 03/19/15 0600  Gross per 24 hour  Intake    840 ml  Output   2550 ml  Net  -1710 ml    Palpable thrill right upper arm AVF. Audible bruit.  Right antecubital incision clean and intact. No hematoma.  Palpable right radial pulse. Sensation and motor intact right hand. Bilateral lower extremity edema.   Assessment/Planning: 60 y.o. male is s/p: right brachial cephalic AV fistula 1 Day Post-Op   Fistula is patent.  No signs of steal syndrome. Has had chronic numbness to right hand.  Will sign off. Follow-up in 4-6 weeks with duplex to check maturation of fistula.   Alvia Grove 03/19/2015 9:59 AM --  Laboratory CBC    Component Value Date/Time   WBC 7.8 03/19/2015 0420   WBC 6.8 07/24/2014 0855   HGB 9.7* 03/19/2015 0420   HGB 9.9* 07/24/2014 0855   HCT 29.3* 03/19/2015 0420   HCT 28.7* 07/24/2014 0855   PLT 191 03/19/2015 0420   PLT 206 07/24/2014 0855    BMET    Component Value Date/Time   NA 131* 03/19/2015 0420   K 4.0 03/19/2015 0420   CL 99* 03/19/2015 0420   CO2 23 03/19/2015 0420   GLUCOSE 117* 03/19/2015 0420   BUN 32* 03/19/2015 0420   CREATININE 4.91* 03/19/2015 0420   CALCIUM 7.9* 03/19/2015 0420   GFRNONAA 12* 03/19/2015 0420   GFRAA 13* 03/19/2015 0420    COAG Lab Results  Component Value Date   INR 1.43 03/13/2015   INR 1.30 06/29/2014   INR 1.32 03/07/2013   No results found for: PTT  Antibiotics Anti-infectives    Start     Dose/Rate Route Frequency Ordered Stop   03/13/15  0700  cefUROXime (ZINACEF) 1.5 g in dextrose 5 % 50 mL IVPB     1.5 g 100 mL/hr over 30 Minutes Intravenous On call to O.R. 03/13/15 0026 03/13/15 0740   03/13/15 0600  cefUROXime (ZINACEF) 1.5 g in dextrose 5 % 50 mL IVPB  Status:  Discontinued     1.5 g 100 mL/hr over 30 Minutes Intravenous On call to O.R. 03/13/15 0026 03/13/15 0029       Virgina Jock, PA-C [facility] Office: [phone] Pager: [phone] 03/19/2015 9:59 AM    Addendum  I agree with the physician assistant's findings.  Follow up in office in 6 weeks with Dr. Baltazar Najjar, MD [facility]: [phone] Pager: [phone]  03/19/2015, 3:29 PM

## 2015-03-19 NOTE — Telephone Encounter (Signed)
Spoke with pt to schedule, however he was hesitant to agree, stating he has to arrange transportation. I told him to call back if there are any questions or concerns. dpm

## 2015-03-19 NOTE — Progress Notes (Signed)
[facility]                                                                              Patient Demographics  Eric [name], is a 60 y.o. male, DOB - 12/08/1955, RE:[id number]  Admit date - 03/10/2015   Admitting Physician Waldemar Dickens, MD  Outpatient Primary MD for the patient is [name], [name], NP  LOS - 9   Chief Complaint  Patient presents with  . Chest Pain  . Shortness of Breath      HPI on 03/10/2015 by Ms. Elmo Putt, NP Eric [name] is a 60 y.o. 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 Pt presents today with complaints of chest pain and shortness of breath for several days becoming increasingly worse prompting visit to ED.   Pt states he is compliant with home meds. He denies recent fever, dizziness or palpitations, abdominal pain, n/v/d. He states he does feel constipated. ED evaluation consistent with acute HF exacerbation, creatinine up to 5.9 from baseline around 3, Hgb 7.3. Pt to be admitted for further evaluation and treatment.   Assessment & Plan   Acute on chronic systolic and diastolic CHF -BNP [id number] -Echocardiogram: EF 45-50%, left ventricular diastolic function parameters are normal  -Cardiology consulted and appreciated -Nephrology consulted and appreciated, patient started on hemodialysis and has lost several liters of fluid -Continue indoor, Coreg (no ACEI/ARB due to renal failure)  Acute on chronic anemia disease, stage III, progressing to ESRD -Patient does have uncontrolled diabetes and hypertensive nephropathy -Baseline creatinine approximately 3, 5 on admission -No significant improvement with IV diuresis -Nephrology and vascular surgery consulted and appreciated -Patient currently has temporary femoral dialysis catheter in place -s/p Right brachial cephalic AVF by vascular  surgery -Patient refused dialysis today as he is in pain from the AVF placement  Normocytic anemia -Hemoccult negative -Hemoglobin 9.7 today -Continue panel consistent with anemia of chronic disease -Continue Aranesp -Monitor CBC  Chest pain/mildly elevated troponin -Currently chest pain-free -Troponin mildly elevated secondary to the above -Cardiology was consulted -Continue aspirin  Diabetes mellitus, type II -Hemoglobin A1c 6.2 in June 2016 -Continue insulin sliding scale CBG monitoring  Hypertension -Continue Coreg, clonidine, hydralazine  History of cardiac arrest -Status post ICD placement  Hepatitis C/cirrhosis -LFTs normal  MGUS -Patient follows up with Dr. Marin Olp  Inability to move extremities -Patient felt he could not move his extremities on 2/23 after AVF placement -Rapid response was called -Patient was able to move all ext with ease, but "felt funny" -CT head done, no new abnormalities  Code Status: Full  Family Communication: None at bedside  Disposition Plan: Admitted. Pending HD tomorrow.  Possible discharge after HD 03/20/15.  Time Spent in minutes   30 minutes  Procedures  Echocardiogram Clinical dialysis catheter placed on 03/13/15 Right brachial cephalic AVF  Consults   Cardiology Nephrology Vascular surgery  DVT Prophylaxis  heparin  Lab Results  Component Value Date   PLT 191 03/19/2015    Medications  Scheduled Meds: . aspirin EC  81 mg Oral Daily  .  calcitRIOL  0.25 mcg Oral Daily  . calcium acetate  1,334 mg Oral TID WC  . carvedilol  6.25 mg Oral BID WC  . Chlorhexidine Gluconate Cloth  6 each Topical Q0600  . chlorpheniramine-HYDROcodone  5 mL Oral Once  . cloNIDine  0.1 mg Oral TID  . darbepoetin (ARANESP) injection - NON-DIALYSIS  150 mcg Subcutaneous Q Fri-1800  . heparin  5,000 Units Subcutaneous 3 times per day  . hydrALAZINE  25 mg Oral 3 times per day  . insulin aspart  0-9 Units Subcutaneous TID WC  .  isosorbide mononitrate  60 mg Oral q morning - 10a  . multivitamin  1 tablet Oral QHS  . sodium chloride flush  3 mL Intravenous Q12H   Continuous Infusions:  PRN Meds:.sodium chloride, sodium chloride, acetaminophen, alteplase, benzonatate, heparin, heparin, heparin, heparin, hydrALAZINE, lidocaine (PF), lidocaine-prilocaine, methocarbamol, nitroGLYCERIN, ondansetron (ZOFRAN) IV, pentafluoroprop-tetrafluoroeth, sodium chloride flush, traMADol  Antibiotics    Anti-infectives    Start     Dose/Rate Route Frequency Ordered Stop   03/13/15 0700  cefUROXime (ZINACEF) 1.5 g in dextrose 5 % 50 mL IVPB     1.5 g 100 mL/hr over 30 Minutes Intravenous On call to O.R. 03/13/15 0026 03/13/15 0740   03/13/15 0600  cefUROXime (ZINACEF) 1.5 g in dextrose 5 % 50 mL IVPB  Status:  Discontinued     1.5 g 100 mL/hr over 30 Minutes Intravenous On call to O.R. 03/13/15 0026 03/13/15 0029      Subjective:   Eric [name] seen and examined today.  Patient complains about right arm pain.  Denies chest pain, abdominal pain, nausea or vomiting, dizziness or headache.  Patient does not want to dialyze today due to arm pain.  Objective:   Filed Vitals:   03/19/15 0423 03/19/15 0944 03/19/15 1012 03/19/15 1131  BP: 156/77 169/84 169/84 140/87  Pulse: 73 76  77  Temp: 97.8 F (36.6 C) 98.5 F (36.9 C)  98.6 F (37 C)  TempSrc: Oral Oral  Oral  Resp: 20 18  18   Height:      Weight: 88.587 kg (195 lb 4.8 oz)     SpO2: 97% 95%  95%    Wt Readings from Last 3 Encounters:  03/19/15 88.587 kg (195 lb 4.8 oz)  08/08/14 82.827 kg (182 lb 9.6 oz)  07/24/14 87.091 kg (192 lb)     Intake/Output Summary (Last 24 hours) at 03/19/15 1253 Last data filed at 03/19/15 0600  Gross per 24 hour  Intake    720 ml  Output   2150 ml  Net  -1430 ml    Exam  General: Well developed, well nourished, NAD  HEENT: NCAT, mucous membranes moist.   Cardiovascular: S1 S2 auscultated, RRR, no murmurs  Respiratory:  Clear to auscultation bilaterally  Abdomen: Soft, nontender, nondistended, + bowel sounds  Extremities: warm dry without cyanosis clubbing, 2+ edema in LE B/L-improving   Neuro: AAOx3, nonfocal  Psych: Normal affect and demeanor   Data Review   Micro Results Recent Results (from the past 240 hour(s))  Surgical pcr screen     Status: None   Collection Time: 03/13/15  5:11 AM  Result Value Ref Range Status   MRSA, PCR NEGATIVE NEGATIVE Final   Staphylococcus aureus NEGATIVE NEGATIVE Final    Comment:        The Xpert SA Assay (FDA approved for NASAL specimens in patients over 89 years of age), is one component of a comprehensive surveillance program.  Test performance has been validated by [facility] for patients greater than or equal to 28 year old. It is not intended to diagnose infection nor to guide or monitor treatment.   Surgical pcr screen     Status: None   Collection Time: 03/18/15  3:53 AM  Result Value Ref Range Status   MRSA, PCR NEGATIVE NEGATIVE Final   Staphylococcus aureus NEGATIVE NEGATIVE Final    Comment:        The Xpert SA Assay (FDA approved for NASAL specimens in patients over 37 years of age), is one component of a comprehensive surveillance program.  Test performance has been validated by [facility] for patients greater than or equal to 21 year old. It is not intended to diagnose infection nor to guide or monitor treatment.     Radiology Reports Dg Chest 2 View  03/10/2015  CLINICAL DATA:  Cough with shortness of breath for a few weeks. History of hypertension and diabetes. EXAM: CHEST  2 VIEW COMPARISON:  Radiographs 06/29/2014 and 08/03/2014. FINDINGS: Left subclavian AICD lead appears unchanged at the right ventricular apex. There is stable mild cardiac enlargement status post median sternotomy and CABG. Compared with the most recent study, the bilateral airspace opacities have partially cleared. There are residual opacities  suspicious for edema. No significant residual pleural fluid. A small asymmetric nodular density inferiorly on the right on the frontal examination may reflect a nipple shadow. The bones appear unchanged. IMPRESSION: Recurrent congestive heart failure with pulmonary edema, less advanced than on the most recent study. Possible nipple shadow projecting over the right lung base; attention on follow-up after the edema has resolved recommended. Electronically Signed   By: Richardean Sale M.D.   On: 03/10/2015 12:51   Ct Head Wo Contrast  03/18/2015  CLINICAL DATA:  60 year old male unable to move arms and legs. EXAM: CT HEAD WITHOUT CONTRAST TECHNIQUE: Contiguous axial images were obtained from the base of the skull through the vertex without intravenous contrast. COMPARISON:  Head CT 04/30/2013. FINDINGS: Again noted is ventriculomegaly, which is similar to prior examinations, and based on comparison with prior MRI of the brain 03/13/2013 is favored to be congenital. No acute intracranial hemorrhage. No definite signs of acute or subacute cerebral ischemia. No mass or mass effect. No acute displaced skull fractures. Visualized paranasal sinuses and mastoids are well pneumatized. IMPRESSION: 1. 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 No definite acute findings are noted on today's examination. Electronically Signed   By: Vinnie Langton M.D.   On: 03/18/2015 17:30   Ct Chest Wo Contrast  03/18/2015  CLINICAL DATA:  Shortness of breath EXAM: CT CHEST WITHOUT CONTRAST TECHNIQUE: Multidetector CT imaging of the chest was performed following the standard protocol without IV contrast. COMPARISON:  None. FINDINGS: Mediastinum / Lymph Nodes: There is no axillary lymphadenopathy. Scattered small mediastinal lymph nodes are evident, some of which are  calcified. No definite hilar lymphadenopathy. Right IJ central line tip is just into the upper right atrium left-sided permanent pacemaker noted. Cardiopericardial silhouette is at upper limits of normal for size. Patient is status post median sternotomy. No pleural effusion. Lungs / Pleura: Scattered patchy interstitial and ground-glass disease is seen in the left upper lobe, left lower lobe. There is left base dependent airspace disease. 9 mm subpleural right middle lobe nodule noted. Patchy airspace disease is seen in the dependent right  lower lobe. Upper Abdomen: Nodular hepatic contour suggests underlying cirrhosis MSK / Soft Tissues: Bone windows reveal no worrisome lytic or sclerotic osseous lesions. IMPRESSION: 1. Patchy bilateral left greater than right airspace disease compatible multifocal pneumonia. Asymmetric pulmonary edema is considered less likely. 2. 9 mm subpleural right middle lobe pulmonary nodule. This is approaching size threshold for reliable resolution by PET imaging. Short-term interval follow-up chest CT could also be used to ensure stability. 3. Contour changes in the liver suggesting cirrhosis. Electronically Signed   By: Misty Stanley M.D.   On: 03/18/2015 17:33   Dg Chest Port 1 View  03/13/2015  CLINICAL DATA:  End-stage renal disease, on dialysis.  Ex-smoker. EXAM: PORTABLE CHEST 1 VIEW COMPARISON:  03/10/2015. FINDINGS: Progressive enlargement of the cardiac silhouette. Increased prominence of the pulmonary vasculature and interstitial markings. Possible small right pleural effusion. Interval right jugular double-lumen catheter with 1 lumen tip in the inferior aspect of the superior vena cava and the other lumen tip at the superior cavoatrial junction or upper right atrium. No pneumothorax. Stable left subclavian AICD lead and median sternotomy wires. Unremarkable bones. IMPRESSION: Mild worsening of changes of congestive heart failure. Electronically Signed   By: Claudie Revering  M.D.   On: 03/13/2015 09:35   Dg Fluoro Guide Cv Line-no Report  03/13/2015  CLINICAL DATA:  FLOURO GUIDE CV LINE Fluoroscopy was utilized by the requesting physician.  No radiographic interpretation.    CBC  Recent Labs Lab 03/16/15 0345 03/17/15 0730 03/18/15 0538 03/18/15 1026 03/19/15 0420  WBC 7.1 8.2 10.9* 9.2 7.8  HGB 8.8* 9.0* 10.3* 9.5* 9.7*  HCT 27.0* 26.8* 31.0* 29.1* 29.3*  PLT 221 201 226 203 191  MCV 88.2 90.8 92.8 90.7 93.6  MCH 28.8 30.5 30.8 29.6 31.0  MCHC 32.6 33.6 33.2 32.6 33.1  RDW 14.1 14.2 14.4 14.3 14.8    Chemistries   Recent Labs Lab 03/16/15 0345 03/17/15 0730 03/18/15 0538 03/18/15 1026 03/19/15 0420  NA 134* 133* 131* 134* 131*  K 3.7 3.5 4.2 4.1 4.0  CL 99* 96* 97* 98* 99*  CO2 25 23 25 25 23   GLUCOSE 96 125* 114* 117* 117*  BUN 34* 41* 24* 25* 32*  CREATININE 4.40* 5.36* 4.09* 4.18* 4.91*  CALCIUM 7.1* 7.6* 7.8* 7.7* 7.9*   ------------------------------------------------------------------------------------------------------------------ estimated creatinine clearance is 16.5 mL/min (by C-G formula based on Cr of 4.91). ------------------------------------------------------------------------------------------------------------------ No results for input(s): HGBA1C in the last 72 hours. ------------------------------------------------------------------------------------------------------------------ No results for input(s): CHOL, HDL, LDLCALC, TRIG, CHOLHDL, LDLDIRECT in the last 72 hours. ------------------------------------------------------------------------------------------------------------------ No results for input(s): TSH, T4TOTAL, T3FREE, THYROIDAB in the last 72 hours.  Invalid input(s): FREET3 ------------------------------------------------------------------------------------------------------------------ No results for input(s): VITAMINB12, FOLATE, FERRITIN, TIBC, IRON, RETICCTPCT in the last 72 hours.  Coagulation  profile  Recent Labs Lab 03/13/15 0541  INR 1.43    No results for input(s): DDIMER in the last 72 hours.  Cardiac Enzymes  Recent Labs Lab 03/18/15 1608  TROPONINI 0.03   ------------------------------------------------------------------------------------------------------------------ Invalid input(s): POCBNP    Camie Hauss D.O. on 03/19/2015 at 12:53 PM  Between 7am to 7pm - Pager - [phone]  After 7pm go to [email or web address] - password [email or web address]  And look for the night coverage person covering for me after hours  [facility] Office  [phone]

## 2015-03-19 NOTE — Progress Notes (Signed)
Pt is refusing HD today, education advised regarding the importance of HD but still pt continue to refuse. Per pt " I think I cannot do it today, I feel so weak". Will continue to monitor pt

## 2015-03-19 NOTE — Progress Notes (Signed)
Hemodialysis- Orders for HD today. Called to get report so could come get patient this am but per primary RN pt is refusing HD today. Will notify renal. Please call hemo if any changes.

## 2015-03-19 NOTE — Progress Notes (Signed)
Subjective:  Sitting up in chair- wife in room- says he cannot possibly do HD today because he is in too much pain from his AVF placement yesterday  Objective Vital signs in last 24 hours: Filed Vitals:   03/18/15 2142 03/19/15 0423 03/19/15 0944 03/19/15 1012  BP: 143/75 156/77 169/84 169/84  Pulse: 74 73 76   Temp: 98.3 F (36.8 C) 97.8 F (36.6 C) 98.5 F (36.9 C)   TempSrc: Oral Oral Oral   Resp: 20 20 18    Height:      Weight:  88.587 kg (195 lb 4.8 oz)    SpO2: 94% 97% 95%    Weight change: -1.812 kg (-3 lb 15.9 oz)  Intake/Output Summary (Last 24 hours) at 03/19/15 1038 Last data filed at 03/19/15 0600  Gross per 24 hour  Intake    840 ml  Output   2550 ml  Net  -1710 ml    Assessment/ Plan: Pt is a 60 y.o. yo male with DM, HTN, CAD s/p CABG and ICD as well as hep c and cirrhosis who was admitted on 03/10/2015 with  volume overload and worsening renal function determined to now be at ESRD  Assessment/Plan: 1. Volume- CHF and failed diuretics due to advanced CKD- now ESRD requiring dialysis 2. ESRD - new diagnosis this admit- s/p first HD on Saturday- 2nd Monday - 3rd Wed- s/p AVF Thursday-  OP dialysis arrangements made- will be TTS second shift at [facility]- can start on Tuesday-   tunneled PC placed 2/18-  4th tx for tomorrow (Saturday) 3. Anemia- advanced - iron stores low- will repleting and started ESA- also received blood this admit 4. Secondary hyperparathyroidism-  PTH 465- added vit D- phos was 7.2 with low calc- added phoslo - now down 5. HTN/volume- UF as able with HD- anticipate will need downward titration of other HTN meds once volume is better- still have ways to go 6. Malnutrition- hopefully will improve with initiation of HD although cirrhosis is likely not helping - added renavite   7. Dispo- I tried to make it clear that dialysis was not optional- he seems hung up on that he will not be able to get transportation to HD- need to make sure is in place for first tx  on Tuesday as OP and then out SW at the center can take it from there.  Could be discharged from hospital tomorrow after HD if has way to get to HD unit on Tuesday   Lovelyn Sheeran A    Labs: Basic Metabolic Panel:  Recent Labs Lab 03/13/15 1139  03/17/15 0730 03/18/15 0538 03/18/15 1026 03/19/15 0420  NA 131*  < > 133* 131* 134* 131*  K 4.2  < > 3.5 4.2 4.1 4.0  CL 99*  < > 96* 97* 98* 99*  CO2 17*  < > 23 25 25 23   GLUCOSE 92  < > 125* 114* 117* 117*  BUN 86*  < > 41* 24* 25* 32*  CREATININE 6.34*  < > 5.36* 4.09* 4.18* 4.91*  CALCIUM 6.5*  < > 7.6* 7.8* 7.7* 7.9*  PHOS 7.2*  --  5.0*  --  3.8  --   < > = values in this interval not displayed. Liver Function Tests:  Recent Labs Lab 03/13/15 1139 03/17/15 0730 03/18/15 1026  ALBUMIN 1.9* 1.9* 1.9*   No results for input(s): LIPASE, AMYLASE in the last 168 hours. No results for input(s): AMMONIA in the last 168 hours. CBC:  Recent Labs  Lab 03/16/15 0345 03/17/15 0730 03/18/15 0538 03/18/15 1026 03/19/15 0420  WBC 7.1 8.2 10.9* 9.2 7.8  HGB 8.8* 9.0* 10.3* 9.5* 9.7*  HCT 27.0* 26.8* 31.0* 29.1* 29.3*  MCV 88.2 90.8 92.8 90.7 93.6  PLT 221 201 226 203 191   Cardiac Enzymes:  Recent Labs Lab 03/18/15 1608  TROPONINI 0.03   CBG:  Recent Labs Lab 03/18/15 0937 03/18/15 1130 03/18/15 1741 03/18/15 2141 03/19/15 0535  GLUCAP 105* 192* 125* 180* 106*    Iron Studies: No results for input(s): IRON, TIBC, TRANSFERRIN, FERRITIN in the last 72 hours. Studies/Results: Ct Head Wo Contrast  03/18/2015  CLINICAL DATA:  60 year old male unable to move arms and legs. EXAM: CT HEAD WITHOUT CONTRAST TECHNIQUE: Contiguous axial images were obtained from the base of the skull through the vertex without intravenous contrast. COMPARISON:  Head CT 04/30/2013. FINDINGS: Again noted is ventriculomegaly, which is similar to prior examinations, and based on comparison with prior MRI of the brain 03/13/2013 is favored  to be congenital. No acute intracranial hemorrhage. No definite signs of acute or subacute cerebral ischemia. No mass or mass effect. No acute displaced skull fractures. Visualized paranasal sinuses and mastoids are well pneumatized. IMPRESSION: 1. 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 No definite acute findings are noted on today's examination. Electronically Signed   By: Vinnie Langton M.D.   On: 03/18/2015 17:30   Ct Chest Wo Contrast  03/18/2015  CLINICAL DATA:  Shortness of breath EXAM: CT CHEST WITHOUT CONTRAST TECHNIQUE: Multidetector CT imaging of the chest was performed following the standard protocol without IV contrast. COMPARISON:  None. FINDINGS: Mediastinum / Lymph Nodes: There is no axillary lymphadenopathy. Scattered small mediastinal lymph nodes are evident, some of which are calcified. No definite hilar lymphadenopathy. Right IJ central line tip is just into the upper right atrium left-sided permanent pacemaker noted. Cardiopericardial silhouette is at upper limits of normal for size. Patient is status post median sternotomy. No pleural effusion. Lungs / Pleura: Scattered patchy interstitial and ground-glass disease is seen in the left upper lobe, left lower lobe. There is left base dependent airspace disease. 9 mm subpleural right middle lobe nodule noted. Patchy airspace disease is seen in the dependent right lower lobe. Upper Abdomen: Nodular hepatic contour suggests underlying cirrhosis MSK / Soft Tissues: Bone windows reveal no worrisome lytic or sclerotic osseous lesions. IMPRESSION: 1. Patchy bilateral left greater than right airspace disease compatible multifocal pneumonia. Asymmetric pulmonary edema is considered less likely. 2. 9 mm subpleural right middle lobe pulmonary nodule. This is approaching size threshold  for reliable resolution by PET imaging. Short-term interval follow-up chest CT could also be used to ensure stability. 3. Contour changes in the liver suggesting cirrhosis. Electronically Signed   By: Misty Stanley M.D.   On: 03/18/2015 17:33   Medications: Infusions:    Scheduled Medications: . aspirin EC  81 mg Oral Daily  . calcitRIOL  0.25 mcg Oral Daily  . calcium acetate  1,334 mg Oral TID WC  . carvedilol  6.25 mg Oral BID WC  . Chlorhexidine Gluconate Cloth  6 each Topical Q0600  . chlorpheniramine-HYDROcodone  5 mL Oral Once  . cloNIDine  0.1 mg Oral TID  . darbepoetin (ARANESP) injection - NON-DIALYSIS  150 mcg Subcutaneous Q Fri-1800  . heparin  5,000 Units Subcutaneous 3 times per day  . hydrALAZINE  25  mg Oral 3 times per day  . insulin aspart  0-9 Units Subcutaneous TID WC  . isosorbide mononitrate  60 mg Oral q morning - 10a  . multivitamin  1 tablet Oral QHS  . sodium chloride flush  3 mL Intravenous Q12H    have reviewed scheduled and prn medications.  Physical Exam: General: Odd affect Heart: RRR Lungs: mostly clear Abdomen: distended Extremities: pitting edema Dialysis Access: right sided PC and new AVF with thrill right upper arm    03/19/2015,10:38 AM  LOS: 9 days

## 2015-03-20 LAB — BASIC METABOLIC PANEL
ANION GAP: 10 (ref 5–15)
BUN: 39 mg/dL — ABNORMAL HIGH (ref 6–20)
CALCIUM: 8 mg/dL — AB (ref 8.9–10.3)
CHLORIDE: 99 mmol/L — AB (ref 101–111)
CO2: 21 mmol/L — AB (ref 22–32)
Creatinine, Ser: 5.26 mg/dL — ABNORMAL HIGH (ref 0.61–1.24)
GFR calc Af Amer: 12 mL/min — ABNORMAL LOW (ref >60)
GFR calc non Af Amer: 11 mL/min — ABNORMAL LOW (ref >60)
GLUCOSE: 137 mg/dL — AB (ref 65–99)
POTASSIUM: 4.8 mmol/L (ref 3.5–5.1)
Sodium: 130 mmol/L — ABNORMAL LOW (ref 135–145)

## 2015-03-20 LAB — CBC
HEMATOCRIT: 29.4 % — AB (ref 39.0–52.0)
Hemoglobin: 9.3 g/dL — ABNORMAL LOW (ref 13.0–17.0)
MCH: 29.1 pg (ref 26.0–34.0)
MCHC: 31.6 g/dL (ref 30.0–36.0)
MCV: 91.9 fL (ref 78.0–100.0)
Platelets: 181 10*3/uL (ref 150–400)
RBC: 3.2 MIL/uL — ABNORMAL LOW (ref 4.22–5.81)
RDW: 14.9 % (ref 11.5–15.5)
WBC: 6.9 10*3/uL (ref 4.0–10.5)

## 2015-03-20 LAB — GLUCOSE, CAPILLARY
Glucose-Capillary: 165 mg/dL — ABNORMAL HIGH (ref 65–99)
Glucose-Capillary: 145 mg/dL — ABNORMAL HIGH (ref 65–99)

## 2015-03-20 MED ORDER — CALCIUM ACETATE (PHOS BINDER) 667 MG PO CAPS
1334.0000 mg | ORAL_CAPSULE | Freq: Three times a day (TID) | ORAL | Status: DC
  Administered 2015-03-20: 1334 mg via ORAL
  Filled 2015-03-20 (×2): qty 2

## 2015-03-20 MED ORDER — RENA-VITE PO TABS: 1.0000 | ORAL_TABLET | Freq: Every day | ORAL | Status: DC

## 2015-03-20 MED ORDER — CALCIUM ACETATE (PHOS BINDER) 667 MG PO CAPS: 1334.0000 mg | ORAL_CAPSULE | Freq: Three times a day (TID) | ORAL | Status: DC

## 2015-03-20 MED ORDER — BENZONATATE 100 MG PO CAPS: 100.0000 mg | ORAL_CAPSULE | Freq: Three times a day (TID) | ORAL | Status: DC | PRN

## 2015-03-20 MED ORDER — TRAMADOL HCL 50 MG PO TABS: 50.0000 mg | ORAL_TABLET | Freq: Two times a day (BID) | ORAL | Status: DC | PRN

## 2015-03-20 MED ORDER — CALCITRIOL 0.25 MCG PO CAPS: 0.2500 ug | ORAL_CAPSULE | Freq: Every day | ORAL | Status: DC

## 2015-03-20 NOTE — Procedures (Signed)
Patient was seen on dialysis and the procedure was supervised.  BFR 400  Via PC BP is  160/84.   Patient appears to be tolerating treatment well  Eric [name] A 03/20/2015

## 2015-03-20 NOTE — Clinical Social Work Note (Signed)
Clinical Social Worker received notification from [place] in Dialysis that patient is now stating that he does not have adequate transport to Dialysis at discharge.  Patient has been clipped to [facility] and plans to discharge today.  CSW provided patient with SCAT application (which patient states he used in the past and didn't like).  Patient not willing to fully explore options or have SCAT reinstated.  Patient claims that he does not have the financial abilities to afford transportation on his own at this time either.  CSW left resources at bedside and updated RN.  Clinical Social Worker will sign off for now as social work intervention is no longer needed. Please consult [place] again if new need arises.  Barbette Or, [place]

## 2015-03-20 NOTE — Progress Notes (Signed)
Pt discharged home, questions answered. Multiple resources given. IV and tele removed.

## 2015-03-20 NOTE — Discharge Summary (Signed)
Physician Discharge Summary  Eric [name] [id number] DOB: 09/09/1955 DOA: 03/10/2015  PCP: [name], NP  Admit date: 03/10/2015 Discharge date: 03/20/2015  Time spent: 45 minutes  Recommendations for Outpatient Follow-up:  Patient will be discharged to home.   Follow up with PCP in one week. Continue dialysis as scheduled. You may call [facility] for transportation services. Continue medications as prescribed Follow up with Dr. Scot Dock in 6 weeks.  Follow a renal/carb modified diet.   Discharge Diagnoses:  Acute on chronic systolic and diastolic heart failure Acute on chronic kidney disease, progressing to end-stage renal disease Normocytic anemia Chest pain/mildly elevated troponin Diabetes mellitus, type II Hypertension History of cardiac arrest Hepatitis C/cirrhosis MGUS Inability to move extremities  Discharge Condition: Stable  Diet recommendation: Renal/carb modified  Filed Weights   03/20/15 0500 03/20/15 0731 03/20/15 1147  Weight: 94.121 kg (207 lb 8 oz) 91.5 kg (201 lb 11.5 oz) 88.1 kg (194 lb 3.6 oz)    History of present illness:  on 03/10/2015 by Ms. Elmo Putt, NP Eric [name] is a 60 y.o. 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 Pt presents today with complaints of chest pain and shortness of breath for several days becoming increasingly worse prompting visit to ED.   Pt states he is compliant with home meds. He denies recent fever, dizziness or palpitations, abdominal pain, n/v/d. He states he does feel constipated. ED evaluation consistent with acute HF exacerbation, creatinine up to 5.9 from baseline around 3, Hgb 7.3. Pt to be admitted for further evaluation and treatment.   Hospital Course:  Acute on chronic systolic and diastolic CHF -BNP [id number] -Echocardiogram: EF 45-50%, left  ventricular diastolic function parameters are normal  -Cardiology consulted and appreciated -Nephrology consulted and appreciated, patient started on hemodialysis and has lost several liters of fluid -Continue indoor, Coreg (no ACEI/ARB due to renal failure)  Acute on chronic kidney disease, stage III, progressing to ESRD -Patient does have uncontrolled diabetes and hypertensive nephropathy -Baseline creatinine approximately 3, 5 on admission -No significant improvement with IV diuresis -Nephrology and vascular surgery consulted and appreciated -Patient currently has temporary femoral dialysis catheter in place -s/p Right brachial cephalic AVF by vascular surgery -Patient successfully dialyzed today with no complications or complaints -Patient stated that he was going to be going out of town to [place]. I explained to the patient, that he will need to tell a dialysis center on Tuesday where he will be going so that he may continue dialysis on vacation in [place]. -Patient proceeded to tell Dr. Moshe Cipro that he had no way of getting to dialysis on Tuesday. -Social work and case management consulted for transportation needs. Patient has used [facility] in the past. Patient was given Valtrex and information on how to reinstate [facility].  Normocytic anemia -Hemoccult negative -Hemoglobin 9.3 today -Continue panel consistent with anemia of chronic disease -Continue Aranesp  Chest pain/mildly elevated troponin -Currently chest pain-free -Troponin mildly elevated secondary to the above -Cardiology was consulted  -Continue aspirin  Diabetes mellitus, type II -Hemoglobin A1c 6.2 in June 2016 -Continue home medications  Hypertension -Continue Coreg, clonidine, hydralazine  History of cardiac arrest -Status post ICD placement  Hepatitis C/cirrhosis -LFTs normal  MGUS -Patient follows up with Dr. Marin Olp  Inability to move extremities -Patient felt he could not move his extremities on  2/23 after AVF placement -Rapid response was  called -Patient was able to move all ext with ease, but "felt funny" -CT head done, no new abnormalities  Procedures  Echocardiogram Clinical dialysis catheter placed on 03/13/15 Right brachial cephalic AVF  Consults  Cardiology Nephrology Vascular surgery  Discharge Exam: Filed Vitals:   03/20/15 1147 03/20/15 1246  BP: 124/75 142/77  Pulse: 74 70  Temp:  98.2 F (36.8 C)  Resp: 15 20   Exam  General: Well developed, well nourished, NAD  HEENT: NCAT, mucous membranes moist.   Cardiovascular: S1 S2 auscultated, RRR, no murmurs  Respiratory: Clear to auscultation bilaterally  Abdomen: Soft, nontender, nondistended, + bowel sounds  Extremities: warm dry without cyanosis clubbing, + edema in LE B/L-improving  Neuro: AAOx3, nonfocal  Discharge Instructions      Discharge Instructions    Discharge instructions    Complete by:  As directed   Patient will be discharged to home.   Follow up with PCP in one week. Continue dialysis as scheduled. You may call [facility] for transportation services. Continue medications as prescribed Follow up with Dr. Scot Dock in 6 weeks.  Follow a renal/carb modified diet.            Medication List    STOP taking these medications        torsemide 20 MG tablet  Commonly known as:  DEMADEX      TAKE these medications        amLODipine 10 MG tablet  Commonly known as:  NORVASC  Take 1 tablet (10 mg total) by mouth daily.     aspirin EC 81 MG tablet  Take 81 mg by mouth daily.     benzonatate 100 MG capsule  Commonly known as:  TESSALON  Take 1 capsule (100 mg total) by mouth 3 (three) times daily as needed for cough.     calcitRIOL 0.25 MCG capsule  Commonly known as:  ROCALTROL  Take 1 capsule (0.25 mcg total) by mouth daily.     calcium acetate 667 MG capsule  Commonly known as:  PHOSLO  Take 2 capsules (1,334 mg total) by mouth 3 (three) times daily with meals.      carvedilol 6.25 MG tablet  Commonly known as:  COREG  Take 1 tablet (6.25 mg total) by mouth 2 (two) times daily with a meal.     cloNIDine 0.1 MG tablet  Commonly known as:  CATAPRES  Take 1 tablet (0.1 mg total) by mouth 3 (three) times daily.     glipiZIDE 10 MG tablet  Commonly known as:  GLUCOTROL  Take 10 mg by mouth daily.     hydrALAZINE 25 MG tablet  Commonly known as:  APRESOLINE  Take 1 tablet (25 mg total) by mouth every 8 (eight) hours.     isosorbide mononitrate 60 MG 24 hr tablet  Commonly known as:  IMDUR  Take 60 mg by mouth every morning.     multivitamin Tabs tablet  Take 1 tablet by mouth at bedtime.     traMADol 50 MG tablet  Commonly known as:  ULTRAM  Take 1 tablet (50 mg total) by mouth every 12 (twelve) hours as needed for moderate pain (please give one tab prior to HD).       No Known Allergies Follow-up Information    Follow up with Deitra Mayo, MD On 05/05/2015.   Specialties:  Vascular Surgery, Cardiology   Why:  @9 :45 am to check fistula.    Contact information:   [address]  [place] [address] [phone]       Follow up with [name], MICHELLE P, NP. Schedule an appointment as soon as possible for a visit in 1 week.   Specialty:  Internal Medicine   Why:  Hospital follow up   Contact information:   [place] [address] [phone]        The results of significant diagnostics from this hospitalization (including imaging, microbiology, ancillary and laboratory) are listed below for reference.    Significant Diagnostic Studies: Dg Chest 2 View  03/10/2015  CLINICAL DATA:  Cough with shortness of breath for a few weeks. History of hypertension and diabetes. EXAM: CHEST  2 VIEW COMPARISON:  Radiographs 06/29/2014 and 08/03/2014. FINDINGS: Left subclavian AICD lead appears unchanged at the right ventricular apex. There is stable mild cardiac enlargement status post median sternotomy and CABG. Compared with  the most recent study, the bilateral airspace opacities have partially cleared. There are residual opacities suspicious for edema. No significant residual pleural fluid. A small asymmetric nodular density inferiorly on the right on the frontal examination may reflect a nipple shadow. The bones appear unchanged. IMPRESSION: Recurrent congestive heart failure with pulmonary edema, less advanced than on the most recent study. Possible nipple shadow projecting over the right lung base; attention on follow-up after the edema has resolved recommended. Electronically Signed   By: Richardean Sale M.D.   On: 03/10/2015 12:51   Ct Head Wo Contrast  03/18/2015  CLINICAL DATA:  60 year old male unable to move arms and legs. EXAM: CT HEAD WITHOUT CONTRAST TECHNIQUE: Contiguous axial images were obtained from the base of the skull through the vertex without intravenous contrast. COMPARISON:  Head CT 04/30/2013. FINDINGS: Again noted is ventriculomegaly, which is similar to prior examinations, and based on comparison with prior MRI of the brain 03/13/2013 is favored to be congenital. No acute intracranial hemorrhage. No definite signs of acute or subacute cerebral ischemia. No mass or mass effect. No acute displaced skull fractures. Visualized paranasal sinuses and mastoids are well pneumatized. IMPRESSION: 1. 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 No definite acute findings are noted on today's examination. Electronically Signed   By: Vinnie Langton M.D.   On: 03/18/2015 17:30   Ct Chest Wo Contrast  03/18/2015  CLINICAL DATA:  Shortness of breath EXAM: CT CHEST WITHOUT CONTRAST TECHNIQUE: Multidetector CT imaging of the chest was performed following the standard protocol without IV contrast. COMPARISON:  None. FINDINGS: Mediastinum / Lymph Nodes: There  is no axillary lymphadenopathy. Scattered small mediastinal lymph nodes are evident, some of which are calcified. No definite hilar lymphadenopathy. Right IJ central line tip is just into the upper right atrium left-sided permanent pacemaker noted. Cardiopericardial silhouette is at upper limits of normal for size. Patient is status post median sternotomy. No pleural effusion. Lungs / Pleura: Scattered patchy interstitial and ground-glass disease is seen in the left upper lobe, left lower lobe. There is left base dependent airspace disease. 9 mm subpleural right middle lobe nodule noted. Patchy airspace disease is seen in the dependent right lower lobe. Upper Abdomen: Nodular hepatic contour suggests underlying cirrhosis MSK / Soft Tissues: Bone windows reveal no worrisome lytic or sclerotic osseous lesions. IMPRESSION: 1. Patchy bilateral left greater than right airspace disease compatible multifocal pneumonia. Asymmetric pulmonary edema is considered less likely. 2. 9 mm subpleural right middle lobe pulmonary nodule. This  is approaching size threshold for reliable resolution by PET imaging. Short-term interval follow-up chest CT could also be used to ensure stability. 3. Contour changes in the liver suggesting cirrhosis. Electronically Signed   By: Misty Stanley M.D.   On: 03/18/2015 17:33   Dg Chest Port 1 View  03/13/2015  CLINICAL DATA:  End-stage renal disease, on dialysis.  Ex-smoker. EXAM: PORTABLE CHEST 1 VIEW COMPARISON:  03/10/2015. FINDINGS: Progressive enlargement of the cardiac silhouette. Increased prominence of the pulmonary vasculature and interstitial markings. Possible small right pleural effusion. Interval right jugular double-lumen catheter with 1 lumen tip in the inferior aspect of the superior vena cava and the other lumen tip at the superior cavoatrial junction or upper right atrium. No pneumothorax. Stable left subclavian AICD lead and median sternotomy wires. Unremarkable bones.  IMPRESSION: Mild worsening of changes of congestive heart failure. Electronically Signed   By: Claudie Revering M.D.   On: 03/13/2015 09:35   Dg Fluoro Guide Cv Line-no Report  03/13/2015  CLINICAL DATA:  FLOURO GUIDE CV LINE Fluoroscopy was utilized by the requesting physician.  No radiographic interpretation.    Microbiology: Recent Results (from the past 240 hour(s))  Surgical pcr screen     Status: None   Collection Time: 03/13/15  5:11 AM  Result Value Ref Range Status   MRSA, PCR NEGATIVE NEGATIVE Final   Staphylococcus aureus NEGATIVE NEGATIVE Final    Comment:        The Xpert SA Assay (FDA approved for NASAL specimens in patients over 56 years of age), is one component of a comprehensive surveillance program.  Test performance has been validated by [facility] for patients greater than or equal to 15 year old. It is not intended to diagnose infection nor to guide or monitor treatment.   Surgical pcr screen     Status: None   Collection Time: 03/18/15  3:53 AM  Result Value Ref Range Status   MRSA, PCR NEGATIVE NEGATIVE Final   Staphylococcus aureus NEGATIVE NEGATIVE Final    Comment:        The Xpert SA Assay (FDA approved for NASAL specimens in patients over 66 years of age), is one component of a comprehensive surveillance program.  Test performance has been validated by [facility] for patients greater than or equal to 41 year old. It is not intended to diagnose infection nor to guide or monitor treatment.      Labs: Basic Metabolic Panel:  Recent Labs Lab 03/17/15 0730 03/18/15 0538 03/18/15 1026 03/19/15 0420 03/20/15 0235  NA 133* 131* 134* 131* 130*  K 3.5 4.2 4.1 4.0 4.8  CL 96* 97* 98* 99* 99*  CO2 23 25 25 23  21*  GLUCOSE 125* 114* 117* 117* 137*  BUN 41* 24* 25* 32* 39*  CREATININE 5.36* 4.09* 4.18* 4.91* 5.26*  CALCIUM 7.6* 7.8* 7.7* 7.9* 8.0*  PHOS 5.0*  --  3.8  --   --    Liver Function Tests:  Recent Labs Lab 03/17/15 0730  03/18/15 1026  ALBUMIN 1.9* 1.9*   No results for input(s): LIPASE, AMYLASE in the last 168 hours. No results for input(s): AMMONIA in the last 168 hours. CBC:  Recent Labs Lab 03/17/15 0730 03/18/15 0538 03/18/15 1026 03/19/15 0420 03/20/15 0235  WBC 8.2 10.9* 9.2 7.8 6.9  HGB 9.0* 10.3* 9.5* 9.7* 9.3*  HCT 26.8* 31.0* 29.1* 29.3* 29.4*  MCV 90.8 92.8 90.7 93.6 91.9  PLT 201 226 203 191 181   Cardiac Enzymes:  Recent Labs Lab 03/18/15 1608  TROPONINI 0.03   BNP: BNP (last 3 results)  Recent Labs  06/29/14 1414 08/03/14 0012 03/10/15 1444  BNP 199.9* 952.6* 1086.9*    ProBNP (last 3 results) No results for input(s): PROBNP in the last 8760 hours.  CBG:  Recent Labs Lab 03/19/15 1129 03/19/15 1633 03/19/15 2058 03/20/15 0622 03/20/15 1204  GLUCAP 155* 142* 172* 165* 145*       Signed:  Aamina Skiff  [facility] 03/20/2015, 1:30 PM

## 2015-03-20 NOTE — Progress Notes (Signed)
Subjective:  Seen in HD- no new complaints- not sure we have been assured that transportation has been arranged- tentatively for discharge today after HD  Objective Vital signs in last 24 hours: Filed Vitals:   03/20/15 0731 03/20/15 0736 03/20/15 0754 03/20/15 0829  BP: 156/86 149/83 156/84 160/84  Pulse: 67 65 65 65  Temp: 97.8 F (36.6 C)     TempSrc: Oral     Resp: 21     Height:      Weight: 91.5 kg (201 lb 11.5 oz)     SpO2: 96%      Weight change: 5.534 kg (12 lb 3.2 oz)  Intake/Output Summary (Last 24 hours) at 03/20/15 [id number] Last data filed at 03/20/15 0600  Gross per 24 hour  Intake    560 ml  Output    275 ml  Net    285 ml    Assessment/ Plan: Pt is a 60 y.o. yo male with DM, HTN, CAD s/p CABG and ICD, CKD as well as hep c and cirrhosis who was admitted on 03/10/2015 with  volume overload and worsening renal function determined to now be at ESRD  Assessment/Plan: 1. Volume- CHF and failed diuretics due to advanced CKD- now ESRD requiring dialysis 2. ESRD - new diagnosis this admit- s/p first HD on Saturday- 2nd Monday - 3rd Wed- s/p AVF 2/23- 4th today  OP dialysis arrangements made- will be TTS second shift at [facility]- can start on Tuesday-   tunneled PC placed 2/18-   3. Anemia- advanced - iron stores low- have been repleting and started ESA- also received blood this admit 4. Secondary hyperparathyroidism-  PTH 465- added vit D- phos was 7.2 with low calc- added phoslo - now down 5. HTN/volume- UF as able with HD- anticipate will need downward titration of other HTN meds once volume is better- still have ways to go 6. Malnutrition- hopefully will improve with initiation of HD although cirrhosis is likely not helping - added renavite   7. Dispo- I tried to make it clear that dialysis was not optional- he seems hung up on that he will not be able to get transportation to HD- need to make sure is in place for first tx on Tuesday as OP and then out SW at the center can take it  from there.  Could be discharged from hospital today after HD if has way to get to HD unit on Tuesday   Dent Plantz A    Labs: Basic Metabolic Panel:  Recent Labs Lab 03/13/15 1139  03/17/15 0730  03/18/15 1026 03/19/15 0420 03/20/15 0235  NA 131*  < > 133*  < > 134* 131* 130*  K 4.2  < > 3.5  < > 4.1 4.0 4.8  CL 99*  < > 96*  < > 98* 99* 99*  CO2 17*  < > 23  < > 25 23 21*  GLUCOSE 92  < > 125*  < > 117* 117* 137*  BUN 86*  < > 41*  < > 25* 32* 39*  CREATININE 6.34*  < > 5.36*  < > 4.18* 4.91* 5.26*  CALCIUM 6.5*  < > 7.6*  < > 7.7* 7.9* 8.0*  PHOS 7.2*  --  5.0*  --  3.8  --   --   < > = values in this interval not displayed. Liver Function Tests:  Recent Labs Lab 03/13/15 1139 03/17/15 0730 03/18/15 1026  ALBUMIN 1.9* 1.9* 1.9*   No results for  input(s): LIPASE, AMYLASE in the last 168 hours. No results for input(s): AMMONIA in the last 168 hours. CBC:  Recent Labs Lab 03/17/15 0730 03/18/15 0538 03/18/15 1026 03/19/15 0420 03/20/15 0235  WBC 8.2 10.9* 9.2 7.8 6.9  HGB 9.0* 10.3* 9.5* 9.7* 9.3*  HCT 26.8* 31.0* 29.1* 29.3* 29.4*  MCV 90.8 92.8 90.7 93.6 91.9  PLT 201 226 203 191 181   Cardiac Enzymes:  Recent Labs Lab 03/18/15 1608  TROPONINI 0.03   CBG:  Recent Labs Lab 03/19/15 0535 03/19/15 1129 03/19/15 1633 03/19/15 2058 03/20/15 0622  GLUCAP 106* 155* 142* 172* 165*    Iron Studies: No results for input(s): IRON, TIBC, TRANSFERRIN, FERRITIN in the last 72 hours. Studies/Results: Ct Head Wo Contrast  03/18/2015  CLINICAL DATA:  60 year old male unable to move arms and legs. EXAM: CT HEAD WITHOUT CONTRAST TECHNIQUE: Contiguous axial images were obtained from the base of the skull through the vertex without intravenous contrast. COMPARISON:  Head CT 04/30/2013. FINDINGS: Again noted is ventriculomegaly, which is similar to prior examinations, and based on comparison with prior MRI of the brain 03/13/2013 is favored to be  congenital. No acute intracranial hemorrhage. No definite signs of acute or subacute cerebral ischemia. No mass or mass effect. No acute displaced skull fractures. Visualized paranasal sinuses and mastoids are well pneumatized. IMPRESSION: 1. 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 No definite acute findings are noted on today's examination. Electronically Signed   By: Vinnie Langton M.D.   On: 03/18/2015 17:30   Ct Chest Wo Contrast  03/18/2015  CLINICAL DATA:  Shortness of breath EXAM: CT CHEST WITHOUT CONTRAST TECHNIQUE: Multidetector CT imaging of the chest was performed following the standard protocol without IV contrast. COMPARISON:  None. FINDINGS: Mediastinum / Lymph Nodes: There is no axillary lymphadenopathy. Scattered small mediastinal lymph nodes are evident, some of which are calcified. No definite hilar lymphadenopathy. Right IJ central line tip is just into the upper right atrium left-sided permanent pacemaker noted. Cardiopericardial silhouette is at upper limits of normal for size. Patient is status post median sternotomy. No pleural effusion. Lungs / Pleura: Scattered patchy interstitial and ground-glass disease is seen in the left upper lobe, left lower lobe. There is left base dependent airspace disease. 9 mm subpleural right middle lobe nodule noted. Patchy airspace disease is seen in the dependent right lower lobe. Upper Abdomen: Nodular hepatic contour suggests underlying cirrhosis MSK / Soft Tissues: Bone windows reveal no worrisome lytic or sclerotic osseous lesions. IMPRESSION: 1. Patchy bilateral left greater than right airspace disease compatible multifocal pneumonia. Asymmetric pulmonary edema is considered less likely. 2. 9 mm subpleural right middle lobe pulmonary nodule. This is approaching size threshold for  reliable resolution by PET imaging. Short-term interval follow-up chest CT could also be used to ensure stability. 3. Contour changes in the liver suggesting cirrhosis. Electronically Signed   By: Misty Stanley M.D.   On: 03/18/2015 17:33   Medications: Infusions:    Scheduled Medications: . aspirin EC  81 mg Oral Daily  . calcitRIOL  0.25 mcg Oral Daily  . calcium acetate  1,334 mg Oral TID WC  . carvedilol  6.25 mg Oral BID WC  . Chlorhexidine Gluconate Cloth  6 each Topical Q0600  . chlorpheniramine-HYDROcodone  5 mL Oral Once  . cloNIDine  0.1 mg Oral TID  . darbepoetin (ARANESP) injection - NON-DIALYSIS  150 mcg Subcutaneous Q Fri-1800  . heparin  5,000 Units Subcutaneous 3 times per day  . hydrALAZINE  25 mg Oral 3 times per day  . insulin aspart  0-9 Units Subcutaneous TID WC  . isosorbide mononitrate  60 mg Oral q morning - 10a  . multivitamin  1 tablet Oral QHS  . sodium chloride flush  3 mL Intravenous Q12H    have reviewed scheduled and prn medications.  Physical Exam: General: Odd affect Heart: RRR Lungs: mostly clear Abdomen: distended Extremities: pitting edema Dialysis Access: right sided PC and new AVF with thrill right upper arm- placed 2/23     03/20/2015,8:35 AM  LOS: 10 days

## 2015-03-20 NOTE — Discharge Instructions (Signed)
End-Stage Kidney Disease The kidneys are two organs that lie on either side of the spine between the middle of the back and the front of the abdomen. The kidneys:   Remove wastes and extra water from the blood.   Produce important hormones. These help keep bones strong, regulate blood pressure, and help create red blood cells.   Balance the fluids and chemicals in the blood and tissues. End-stage kidney disease occurs when the kidneys are so damaged that they cannot do their job. When the kidneys cannot do their job, life-threatening problems occur. The body cannot stay clean and strong without the help of the kidneys. In end-stage kidney disease, the kidneys cannot get better.You need a new kidney or treatments to do some of the work healthy kidneys do in order to stay alive. CAUSES  End-stage kidney disease usually occurs when a long-lasting (chronic) kidney disease gets worse. It may also occur after the kidneys are suddenly damaged (acute kidney injury).  SYMPTOMS   Swelling (edema) of the legs, ankles, or feet.   Tiredness (lethargy).   Nausea or vomiting.   Confusion.   Problems with urination, such as:   Decreased urine production.   Frequent urination, especially at night.   Frequent accidents in children who are potty trained.   Muscle twitches and cramps.   Persistent itchiness.   Loss of appetite.   Headaches.   Abnormally dark or light skin.   Numbness in the hands or feet.   Easy bruising.   Frequent hiccups.   Menstruation stops. DIAGNOSIS  Your health care provider will measure your blood pressure and take some tests. These may include:   Urine tests.   Blood tests.   Imaging tests, such as:   An ultrasound exam.   Computed tomography (CT).  A kidney biopsy. TREATMENT  There are two treatments for end-stage kidney disease:   A procedure that removes toxic wastes from the body (dialysis).   Receiving a new kidney  (kidney transplant). Both of these treatments have serious risks and consequences. Your health care provider will help you determine which treatment is best for you based on your health, age, and other factors. In addition to having dialysis or a kidney transplant, you may need to take medicines to control high blood pressure (hypertension) and cholesterol and to decrease phosphorus levels in your blood.  HOME CARE INSTRUCTIONS  Follow your prescribed diet.   Take medicines only as directed by your health care provider.   Do not take any new medicines (prescription, over-the-counter, or nutritional supplements) unless approved by your health care provider. Many medicines can worsen your kidney damage or need to have the dose adjusted.   Keep all follow-up visits as directed by your health care provider. MAKE SURE YOU:  Understand these instructions.  Will watch your condition.  Will get help right away if you are not doing well or get worse.   This information is not intended to replace advice given to you by your health care provider. Make sure you discuss any questions you have with your health care provider.   Document Released: 04/01/2003 Document Revised: 01/30/2014 Document Reviewed: 09/08/2011 Elsevier Interactive Patient Education 2016 [employer]  

## 2015-03-20 NOTE — Progress Notes (Signed)
Per Md request, CM met with pt and pt confirms understanding of transportation application for ([employer]) and the MEDICAID transportation (both given and explained by CSW).  Pt has 4 bus passes and verbalized understanding it is pt's responsibility to arrange for transportation with the resources provided.  No other CM needs were communicated.

## 2015-03-23 ENCOUNTER — Telehealth: Payer: Self-pay | Admitting: Cardiology

## 2015-03-23 ENCOUNTER — Ambulatory Visit (INDEPENDENT_AMBULATORY_CARE_PROVIDER_SITE_OTHER): Payer: Medicaid Other | Admitting: *Deleted

## 2015-03-23 DIAGNOSIS — I469 Cardiac arrest, cause unspecified: Secondary | ICD-10-CM | POA: Diagnosis not present

## 2015-03-23 NOTE — Telephone Encounter (Signed)
LMOVM reminding pt to send remote transmission.   

## 2015-03-24 NOTE — Progress Notes (Signed)
Remote ICD transmission.   

## 2015-04-23 LAB — CUP PACEART REMOTE DEVICE CHECK
Battery Remaining Longevity: 84 mo
Battery Remaining Percentage: 83 %
Date Time Interrogation Session: 20170301065042
HIGH POWER IMPEDANCE MEASURED VALUE: 56 Ohm
HIGH POWER IMPEDANCE MEASURED VALUE: 56 Ohm
Implantable Lead Model: 181
Implantable Lead Serial Number: 322161
Lead Channel Impedance Value: 380 Ohm
Lead Channel Setting Pacing Pulse Width: 0.5 ms
MDC IDC LEAD IMPLANT DT: 20150302
MDC IDC LEAD LOCATION: 753860
MDC IDC MSMT BATTERY VOLTAGE: 2.99 V
MDC IDC MSMT LEADCHNL RV PACING THRESHOLD AMPLITUDE: 0.75 V
MDC IDC MSMT LEADCHNL RV PACING THRESHOLD PULSEWIDTH: 0.5 ms
MDC IDC MSMT LEADCHNL RV SENSING INTR AMPL: 11.8 mV
MDC IDC PG SERIAL: 7175540
MDC IDC SET LEADCHNL RV PACING AMPLITUDE: 2.5 V
MDC IDC SET LEADCHNL RV SENSING SENSITIVITY: 0.5 mV
MDC IDC STAT BRADY RV PERCENT PACED: 1 %

## 2015-04-26 ENCOUNTER — Encounter: Payer: Self-pay | Admitting: Cardiology

## 2015-04-27 ENCOUNTER — Encounter: Payer: Self-pay | Admitting: Family

## 2015-04-30 ENCOUNTER — Ambulatory Visit (HOSPITAL_COMMUNITY): Payer: Self-pay

## 2015-05-05 ENCOUNTER — Encounter: Payer: Self-pay | Admitting: Vascular Surgery

## 2015-05-10 ENCOUNTER — Encounter: Payer: Self-pay | Admitting: Cardiology

## 2015-05-24 DIAGNOSIS — K567 Ileus, unspecified: Secondary | ICD-10-CM

## 2015-05-24 HISTORY — DX: Ileus, unspecified: K56.7

## 2015-06-14 ENCOUNTER — Encounter (HOSPITAL_COMMUNITY): Payer: Self-pay | Admitting: *Deleted

## 2015-06-14 ENCOUNTER — Emergency Department (HOSPITAL_COMMUNITY): Payer: Medicaid Other

## 2015-06-14 ENCOUNTER — Inpatient Hospital Stay (HOSPITAL_COMMUNITY)
Admission: EM | Admit: 2015-06-14 | Discharge: 2015-07-01 | DRG: 417 | Disposition: A | Discharge status: Home / Self Care | Payer: Medicaid Other | Attending: Family Medicine | Admitting: Family Medicine

## 2015-06-14 DIAGNOSIS — Z8674 Personal history of sudden cardiac arrest: Secondary | ICD-10-CM

## 2015-06-14 DIAGNOSIS — E1122 Type 2 diabetes mellitus with diabetic chronic kidney disease: Secondary | ICD-10-CM | POA: Diagnosis present

## 2015-06-14 DIAGNOSIS — H540 Blindness, both eyes: Secondary | ICD-10-CM | POA: Diagnosis present

## 2015-06-14 DIAGNOSIS — K859 Acute pancreatitis without necrosis or infection, unspecified: Secondary | ICD-10-CM

## 2015-06-14 DIAGNOSIS — N2581 Secondary hyperparathyroidism of renal origin: Secondary | ICD-10-CM | POA: Diagnosis present

## 2015-06-14 DIAGNOSIS — K9189 Other postprocedural complications and disorders of digestive system: Secondary | ICD-10-CM

## 2015-06-14 DIAGNOSIS — R200 Anesthesia of skin: Secondary | ICD-10-CM | POA: Diagnosis not present

## 2015-06-14 DIAGNOSIS — Z951 Presence of aortocoronary bypass graft: Secondary | ICD-10-CM

## 2015-06-14 DIAGNOSIS — D638 Anemia in other chronic diseases classified elsewhere: Secondary | ICD-10-CM | POA: Diagnosis present

## 2015-06-14 DIAGNOSIS — Z7982 Long term (current) use of aspirin: Secondary | ICD-10-CM

## 2015-06-14 DIAGNOSIS — T85598D Other mechanical complication of other gastrointestinal prosthetic devices, implants and grafts, subsequent encounter: Secondary | ICD-10-CM

## 2015-06-14 DIAGNOSIS — K8012 Calculus of gallbladder with acute and chronic cholecystitis without obstruction: Principal | ICD-10-CM | POA: Diagnosis present

## 2015-06-14 DIAGNOSIS — R1011 Right upper quadrant pain: Secondary | ICD-10-CM | POA: Insufficient documentation

## 2015-06-14 DIAGNOSIS — B1920 Unspecified viral hepatitis C without hepatic coma: Secondary | ICD-10-CM | POA: Diagnosis present

## 2015-06-14 DIAGNOSIS — R140 Abdominal distension (gaseous): Secondary | ICD-10-CM | POA: Insufficient documentation

## 2015-06-14 DIAGNOSIS — E1140 Type 2 diabetes mellitus with diabetic neuropathy, unspecified: Secondary | ICD-10-CM | POA: Diagnosis present

## 2015-06-14 DIAGNOSIS — E871 Hypo-osmolality and hyponatremia: Secondary | ICD-10-CM | POA: Diagnosis present

## 2015-06-14 DIAGNOSIS — E785 Hyperlipidemia, unspecified: Secondary | ICD-10-CM | POA: Diagnosis present

## 2015-06-14 DIAGNOSIS — Z95810 Presence of automatic (implantable) cardiac defibrillator: Secondary | ICD-10-CM

## 2015-06-14 DIAGNOSIS — I25118 Atherosclerotic heart disease of native coronary artery with other forms of angina pectoris: Secondary | ICD-10-CM | POA: Diagnosis present

## 2015-06-14 DIAGNOSIS — Z8249 Family history of ischemic heart disease and other diseases of the circulatory system: Secondary | ICD-10-CM

## 2015-06-14 DIAGNOSIS — E876 Hypokalemia: Secondary | ICD-10-CM | POA: Diagnosis not present

## 2015-06-14 DIAGNOSIS — Z992 Dependence on renal dialysis: Secondary | ICD-10-CM

## 2015-06-14 DIAGNOSIS — T85598A Other mechanical complication of other gastrointestinal prosthetic devices, implants and grafts, initial encounter: Secondary | ICD-10-CM | POA: Insufficient documentation

## 2015-06-14 DIAGNOSIS — K851 Biliary acute pancreatitis without necrosis or infection: Secondary | ICD-10-CM | POA: Diagnosis present

## 2015-06-14 DIAGNOSIS — G40909 Epilepsy, unspecified, not intractable, without status epilepticus: Secondary | ICD-10-CM | POA: Diagnosis present

## 2015-06-14 DIAGNOSIS — K56609 Unspecified intestinal obstruction, unspecified as to partial versus complete obstruction: Secondary | ICD-10-CM

## 2015-06-14 DIAGNOSIS — Z4502 Encounter for adjustment and management of automatic implantable cardiac defibrillator: Secondary | ICD-10-CM | POA: Insufficient documentation

## 2015-06-14 DIAGNOSIS — I255 Ischemic cardiomyopathy: Secondary | ICD-10-CM | POA: Diagnosis present

## 2015-06-14 DIAGNOSIS — R339 Retention of urine, unspecified: Secondary | ICD-10-CM | POA: Diagnosis not present

## 2015-06-14 DIAGNOSIS — R079 Chest pain, unspecified: Secondary | ICD-10-CM

## 2015-06-14 DIAGNOSIS — Z87891 Personal history of nicotine dependence: Secondary | ICD-10-CM

## 2015-06-14 DIAGNOSIS — Z982 Presence of cerebrospinal fluid drainage device: Secondary | ICD-10-CM

## 2015-06-14 DIAGNOSIS — Z4659 Encounter for fitting and adjustment of other gastrointestinal appliance and device: Secondary | ICD-10-CM

## 2015-06-14 DIAGNOSIS — Z8701 Personal history of pneumonia (recurrent): Secondary | ICD-10-CM

## 2015-06-14 DIAGNOSIS — K430 Incisional hernia with obstruction, without gangrene: Secondary | ICD-10-CM | POA: Diagnosis not present

## 2015-06-14 DIAGNOSIS — Z7984 Long term (current) use of oral hypoglycemic drugs: Secondary | ICD-10-CM

## 2015-06-14 DIAGNOSIS — K8020 Calculus of gallbladder without cholecystitis without obstruction: Secondary | ICD-10-CM

## 2015-06-14 DIAGNOSIS — I252 Old myocardial infarction: Secondary | ICD-10-CM

## 2015-06-14 DIAGNOSIS — K7460 Unspecified cirrhosis of liver: Secondary | ICD-10-CM | POA: Diagnosis present

## 2015-06-14 DIAGNOSIS — Z0189 Encounter for other specified special examinations: Secondary | ICD-10-CM

## 2015-06-14 DIAGNOSIS — Z833 Family history of diabetes mellitus: Secondary | ICD-10-CM

## 2015-06-14 DIAGNOSIS — I132 Hypertensive heart and chronic kidney disease with heart failure and with stage 5 chronic kidney disease, or end stage renal disease: Secondary | ICD-10-CM | POA: Diagnosis present

## 2015-06-14 DIAGNOSIS — N186 End stage renal disease: Secondary | ICD-10-CM

## 2015-06-14 DIAGNOSIS — R918 Other nonspecific abnormal finding of lung field: Secondary | ICD-10-CM | POA: Diagnosis present

## 2015-06-14 DIAGNOSIS — I5042 Chronic combined systolic (congestive) and diastolic (congestive) heart failure: Secondary | ICD-10-CM | POA: Diagnosis present

## 2015-06-14 DIAGNOSIS — Z9114 Patient's other noncompliance with medication regimen: Secondary | ICD-10-CM

## 2015-06-14 DIAGNOSIS — K567 Ileus, unspecified: Secondary | ICD-10-CM | POA: Insufficient documentation

## 2015-06-14 HISTORY — DX: Ileus, unspecified: K56.7

## 2015-06-14 LAB — COMPREHENSIVE METABOLIC PANEL
ALBUMIN: 3.2 g/dL — AB (ref 3.5–5.0)
ALK PHOS: 66 U/L (ref 38–126)
ALT: 18 U/L (ref 17–63)
AST: 11 U/L — AB (ref 15–41)
Anion gap: 13 (ref 5–15)
BILIRUBIN TOTAL: 0.6 mg/dL (ref 0.3–1.2)
BUN: 56 mg/dL — AB (ref 6–20)
CO2: 22 mmol/L (ref 22–32)
CREATININE: 9.05 mg/dL — AB (ref 0.61–1.24)
Calcium: 8.1 mg/dL — ABNORMAL LOW (ref 8.9–10.3)
Chloride: 96 mmol/L — ABNORMAL LOW (ref 101–111)
GFR calc Af Amer: 6 mL/min — ABNORMAL LOW (ref >60)
GFR, EST NON AFRICAN AMERICAN: 6 mL/min — AB (ref >60)
GLUCOSE: 129 mg/dL — AB (ref 65–99)
POTASSIUM: 4.4 mmol/L (ref 3.5–5.1)
Sodium: 131 mmol/L — ABNORMAL LOW (ref 135–145)
TOTAL PROTEIN: 9.2 g/dL — AB (ref 6.5–8.1)

## 2015-06-14 LAB — CBC
HEMATOCRIT: 35.8 % — AB (ref 39.0–52.0)
HEMOGLOBIN: 12 g/dL — AB (ref 13.0–17.0)
MCH: 29.3 pg (ref 26.0–34.0)
MCHC: 33.5 g/dL (ref 30.0–36.0)
MCV: 87.3 fL (ref 78.0–100.0)
Platelets: 130 10*3/uL — ABNORMAL LOW (ref 150–400)
RBC: 4.1 MIL/uL — AB (ref 4.22–5.81)
RDW: 12.6 % (ref 11.5–15.5)
WBC: 6 10*3/uL (ref 4.0–10.5)

## 2015-06-14 LAB — PHOSPHORUS: Phosphorus: 6.1 mg/dL — ABNORMAL HIGH (ref 2.5–4.6)

## 2015-06-14 LAB — PROTIME-INR
INR: 1.58 — ABNORMAL HIGH (ref 0.00–1.49)
Prothrombin Time: 18.9 seconds — ABNORMAL HIGH (ref 11.6–15.2)

## 2015-06-14 LAB — VITAMIN B12: VITAMIN B 12: 566 pg/mL (ref 180–914)

## 2015-06-14 LAB — TROPONIN I
TROPONIN I: 0.03 ng/mL (ref <0.031)
TROPONIN I: 0.04 ng/mL — AB (ref <0.031)
Troponin I: 0.04 ng/mL — ABNORMAL HIGH (ref <0.031)

## 2015-06-14 LAB — MAGNESIUM: Magnesium: 2.4 mg/dL (ref 1.7–2.4)

## 2015-06-14 LAB — BRAIN NATRIURETIC PEPTIDE: B NATRIURETIC PEPTIDE 5: 75.2 pg/mL (ref 0.0–100.0)

## 2015-06-14 LAB — GLUCOSE, CAPILLARY: GLUCOSE-CAPILLARY: 152 mg/dL — AB (ref 65–99)

## 2015-06-14 LAB — LIPASE, BLOOD: LIPASE: 195 U/L — AB (ref 11–51)

## 2015-06-14 MED ORDER — ONDANSETRON HCL 4 MG/2ML IJ SOLN
4.0000 mg | Freq: Four times a day (QID) | INTRAMUSCULAR | Status: DC | PRN
  Administered 2015-06-17 – 2015-06-18 (×2): 4 mg via INTRAVENOUS
  Filled 2015-06-14: qty 2

## 2015-06-14 MED ORDER — CALCITRIOL 0.25 MCG PO CAPS
0.2500 ug | ORAL_CAPSULE | Freq: Every day | ORAL | Status: DC
  Filled 2015-06-14: qty 1

## 2015-06-14 MED ORDER — GI COCKTAIL ~~LOC~~
30.0000 mL | Freq: Four times a day (QID) | ORAL | Status: DC | PRN
  Administered 2015-06-18: 30 mL via ORAL
  Filled 2015-06-14 (×2): qty 30

## 2015-06-14 MED ORDER — HEPARIN SODIUM (PORCINE) 5000 UNIT/ML IJ SOLN
5000.0000 [IU] | Freq: Three times a day (TID) | INTRAMUSCULAR | Status: DC
  Administered 2015-06-14 – 2015-07-01 (×41): 5000 [IU] via SUBCUTANEOUS
  Filled 2015-06-14 (×43): qty 1

## 2015-06-14 MED ORDER — CLONIDINE HCL 0.1 MG PO TABS
0.1000 mg | ORAL_TABLET | Freq: Three times a day (TID) | ORAL | Status: DC
  Filled 2015-06-14 (×2): qty 1

## 2015-06-14 MED ORDER — CALCIUM ACETATE (PHOS BINDER) 667 MG PO CAPS: 1334.0000 mg | ORAL_CAPSULE | Freq: Three times a day (TID) | ORAL | Status: DC

## 2015-06-14 MED ORDER — HYDRALAZINE HCL 25 MG PO TABS
25.0000 mg | ORAL_TABLET | Freq: Three times a day (TID) | ORAL | Status: DC
  Filled 2015-06-14 (×2): qty 1

## 2015-06-14 MED ORDER — AMLODIPINE BESYLATE 10 MG PO TABS
10.0000 mg | ORAL_TABLET | Freq: Every day | ORAL | Status: DC
  Administered 2015-06-15 – 2015-06-24 (×4): 10 mg via ORAL
  Filled 2015-06-14 (×7): qty 1

## 2015-06-14 MED ORDER — CARVEDILOL 6.25 MG PO TABS
6.2500 mg | ORAL_TABLET | Freq: Two times a day (BID) | ORAL | Status: DC
  Administered 2015-06-15 – 2015-06-24 (×9): 6.25 mg via ORAL
  Filled 2015-06-14 (×14): qty 1

## 2015-06-14 MED ORDER — INSULIN ASPART 100 UNIT/ML ~~LOC~~ SOLN
0.0000 [IU] | Freq: Three times a day (TID) | SUBCUTANEOUS | Status: DC
  Administered 2015-06-16 – 2015-06-19 (×2): 2 [IU] via SUBCUTANEOUS
  Administered 2015-06-22: 1 [IU] via SUBCUTANEOUS
  Administered 2015-06-23: 3 [IU] via SUBCUTANEOUS
  Administered 2015-06-24 – 2015-06-25 (×2): 2 [IU] via SUBCUTANEOUS
  Administered 2015-06-26: 1 [IU] via SUBCUTANEOUS
  Administered 2015-06-27 (×2): 2 [IU] via SUBCUTANEOUS
  Administered 2015-06-28 – 2015-06-29 (×2): 1 [IU] via SUBCUTANEOUS
  Administered 2015-06-29: 2 [IU] via SUBCUTANEOUS
  Administered 2015-06-30: 3 [IU] via SUBCUTANEOUS
  Administered 2015-07-01: 1 [IU] via SUBCUTANEOUS
  Administered 2015-07-01: 2 [IU] via SUBCUTANEOUS

## 2015-06-14 MED ORDER — DIATRIZOATE MEGLUMINE & SODIUM 66-10 % PO SOLN
ORAL | Status: AC
  Filled 2015-06-14: qty 30

## 2015-06-14 MED ORDER — ISOSORBIDE MONONITRATE ER 60 MG PO TB24
60.0000 mg | ORAL_TABLET | Freq: Every day | ORAL | Status: DC
  Administered 2015-06-15 – 2015-07-01 (×9): 60 mg via ORAL
  Filled 2015-06-14 (×11): qty 1

## 2015-06-14 MED ORDER — ASPIRIN 81 MG PO CHEW
324.0000 mg | CHEWABLE_TABLET | Freq: Once | ORAL | Status: AC
Start: 2015-06-14 — End: 2015-06-14

## 2015-06-14 MED ORDER — NITROGLYCERIN 0.4 MG SL SUBL
SUBLINGUAL_TABLET | SUBLINGUAL | Status: AC
  Administered 2015-06-14 (×2)
  Administered 2015-06-14: 0.4 mg
  Filled 2015-06-14: qty 1

## 2015-06-14 MED ORDER — ACETAMINOPHEN 325 MG PO TABS
650.0000 mg | ORAL_TABLET | ORAL | Status: DC | PRN
  Administered 2015-06-16 – 2015-06-30 (×4): 650 mg via ORAL
  Filled 2015-06-14 (×5): qty 2

## 2015-06-14 NOTE — ED Notes (Signed)
Attempted report 

## 2015-06-14 NOTE — H&P (Signed)
[facility] Admission History and Physical Service Pager: [phone]  Patient name: Eric [name] Medical record number: [id number] Date of birth: [birth year] Age: 60 y.o. Gender: male  Primary Care Provider: [name], NP Consultants: None  Code Status: Full   Chief Complaint: chest pain   Assessment and Plan: Eric [name] is a 60 y.o. male presenting with chest pain/HTN . PMH is significant for HTN, HLD, Cardiac arrest, HFrEF, AICD, MI (2015), ESRD, Type 2 DM, CABG (1999), Hepatitis C (cirrhosis)  Chest Pain: Heart Score 4. Patient with substernal pain, no difference in chest pain on exertion or at rest. Patient does indicate nausea, SOB, and radiation of pain to his stomach.  Patient with hx of CABG (1999), cardiac arrest. Risk factors include HTN, HLD, DM, hx of smoking. Blood pressure was elevated on arrival 173/97, however improved in the ED. Patient with no radiation of pain to the back. No hoariness of voice, or signs of horner syndrome. As patient's blood pressure is improving and no radiation of chest pain, would not consider this likely to be aortic dissection. Patient significant hx of cardiac issues, therefore will rule out for ACS. Troponin <0.03. EKG with no ST elevation or depression, possible left atrial enlargement  - Admit to telemetry, Dr. Nori Riis  - Will get troponin x3  - EKG in the AM  - Vitals per floor,  - Consider calling cardiology if troponin elevated  -  IMDUR  - Coreg 6.25 mg x BID   Abdominal pain: Patient with epigastric abdominal pain, however non-localized pain throughout on abdominal exam, worse in epigastric area. Patient has feeling of fullness and bloating, no vomiting. Consider ileus vs SBO vs Pancreatitis . Lipase returned 195, unclear if this may be as a result of ESRD / Dialysis. Though he is having epigastric pain.  Additionally, RUQ pain. When asked where his pain is he points here. Imagine as below, additionally  relates some of his symptoms to taking what sounds like his phoslo pills.  - Abdominal/Pelvis CT  - Lipase - NPO - Holding on fluids given inability to tolerate dialysis today. - hold phoslo for now.    Diffuse Numbness: Neuro exam normal, normal strength throughout. Possibly due to dialysis vs. Diabetic neuropathy, however seems to be acute onset. Per patient has VP shunt - Will continue to follow - Will get RPR and B12  - If  Neuro exam becomes more localized, would consider CT head   HFrEF: EF 45-50%, moderate concentric hypertrophy, Systolic pressure was mildly to moderately increased. PA peak pressure: 47 mm Hg (S). Patient with SOB, no swelling in legs, no crackles of lung exam  - Will get BNP  - CXR  No pulmonary edema   HTN BP 165/95 - Continue home clonidine, hydralazine, Norvasc 10 mg   ESRD: M/W/F dialysis, k is 4.4.   - Dialysis today - consult nephrology.  - check phos, mg.   T2DM. Holding Glipizide  - CBGS ACQHS  - Sensitive sliding scale   FEN/GI: NPO after midnight  Prophylaxis: heparin SBQ  Disposition: Telemetry   History of Present Illness:  Eric [name] is a 60 y.o. male presenting with chest pain. Per patient he started having chest pain last night. He states this pain started after nephrology prescribed a medication over the past couple of days. He was not sure of name of the medication, but plans on getting his wife to bring it in. He states that this chest pain became more severe  this morning. It was a 10/10 pain. Patient went to dialysis and felt that he could not tolerate the chest pain. At that time, patient had elevated blood pressure in the 220s per the ED note. Therefore, EMS was called. In the ED, patient continued to complain of chest pain. He states is constant in nature. States he has nausea, no radiation to back, however pain radiates to his stomach. He also endorses numbness in hands/arms and legs, he states this is worsened from his previous  baseline. He indicates that he has felt SOB with the chest pain, however not currently.  Patient took aspirin last night for the chest pain without much relief. Has recently had decreased fluid intake, as he has been having a feeling of bloating and fullness when he drinks any fluids.   Review Of Systems: Per HPI with the following additions: Review of Systems  Constitutional: Negative for fever and chills.  HENT: Negative for congestion and sore throat.   Respiratory: Positive for shortness of breath. Negative for cough and hemoptysis.   Cardiovascular: Positive for chest pain and leg swelling. Negative for palpitations and orthopnea.  Gastrointestinal: Positive for nausea. Negative for vomiting and blood in stool.  Skin: Negative for rash.  Neurological: Positive for tingling and headaches. Negative for speech change and focal weakness.   Otherwise the remainder of the systems were negative.  Patient Active Problem List   Diagnosis Date Noted  . CHF exacerbation ([place]) 03/10/2015  . Heart failure, acute on chronic, systolic and diastolic ([place]) 123456  . Acute on chronic congestive heart failure ([place])   . Diabetes mellitus with complication ([place])   . Acute-on-chronic kidney injury ([place])   . CHF (congestive heart failure) ([place]) 08/03/2014  . Hypertensive urgency 08/03/2014  . Diabetes mellitus type 2, controlled ([place]) 08/03/2014  . Obesity (BMI 30-39.9) 08/03/2014  . Bilateral leg pain 08/03/2014  . Acute on chronic combined systolic and diastolic CHF (congestive heart failure) ([place]) 07/02/2014  . Acute renal failure superimposed on stage 3 chronic kidney disease ([place]) 07/01/2014  . Acute renal failure ([place]) 06/29/2014  . CKD (chronic kidney disease) stage 3, GFR 30-59 ml/min 06/29/2014  . DM (diabetes mellitus), type 2 with renal complications ([place]) 123456  . Rash and nonspecific skin eruption 06/29/2014  . AICD (automatic cardioverter/defibrillator) present 06/29/2014  .  Essential hypertension, benign 06/29/2014  . Hepatic cirrhosis ([place]) 11/28/2013  . Cardiomyopathy, ischemic 11/28/2013  . Hepatitis C antibody test positive 11/28/2013  . Cirrhosis ([place])   . Pain in the chest   . Type 2 diabetes mellitus with hyperglycemia ([place])   . Chest pain 11/26/2013  . Diabetes mellitus with hypoglycemia ([place]) 04/29/2013  . HCAP (healthcare-associated pneumonia) 04/28/2013  . Fever 04/28/2013  . Acute respiratory failure ([place]) 04/28/2013  . DM type 2 (diabetes mellitus, type 2) ([place]) 04/28/2013  . Pneumonia 04/28/2013  . Hypertension   . Hyperlipemia   . Ventricular fibrillation ([place]) 03/08/2013  . Cardiac arrest ([place]) 03/07/2013  . Seizure ([place]) 03/07/2013  . Acute respiratory failure with hypoxia ([place]) 03/07/2013    Past Medical History: Past Medical History  Diagnosis Date  . Hypertension   . Hyperlipemia   . Edema   . Cardiac arrest due to underlying cardiac condition ([place])   . Obesity   . Ventricular tachycardia ([place])   . AICD (automatic cardioverter/defibrillator) present   . CHF (congestive heart failure) ([place])   . Myocardial infarction ([place]) 03/2013  . Pneumonia 2016  . History of blood transfusion  1950's    "related to pinal menigitis; HAD 16 OPERATIONS TOTAL"  . Scabies infestation 06/29/2014  . Shortness of breath dyspnea   . Type II diabetes mellitus ([place])     type 2  . CKD (chronic kidney disease), stage III     Past Surgical History: Past Surgical History  Procedure Laterality Date  . Implantable cardioverter defibrillator implant  03/24/2013    STJ single chamber ICD implanted by Dr Caryl Comes for secondary prevention  . Left heart catheterization with coronary/graft angiogram  03/07/2013    Procedure: LEFT HEART CATHETERIZATION WITH [name];  Surgeon: Clent Demark, MD;  Location: [facility] CATH LAB;  Service: Cardiovascular;;  . Implantable cardioverter defibrillator implant N/A 03/24/2013    Procedure: IMPLANTABLE CARDIOVERTER  DEFIBRILLATOR IMPLANT;  Surgeon: Deboraha Sprang, MD;  Location: [facility] CATH LAB;  Service: Cardiovascular;  Laterality: N/A;  . Cardiac catheterization    . Head surgery  1950'S    "for spinal menigitis; HAD 16 OPERATIONS TOTAL"  . Back surgery  1950's    "for spinal menigitis; HAD 16 OPERATIONS TOTAL"  . 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 Coronary artery bypass graft  1999    cabg x4  . Insertion of dialysis catheter N/A 03/13/2015    Procedure: INSERTION OF TUNNELED DIALYSIS CATHETER;  Surgeon: Conrad Reddick, MD;  Location: [facility];  Service: Vascular;  Laterality: N/A;  . Av fistula placement Right 03/18/2015    Procedure: BRACHIOCEPHALIC ARTERIOVENOUS (AV) FISTULA CREATION;  Surgeon: Angelia Mould, MD;  Location: [facility] OR;  Service: Vascular;  Laterality: Right;    Social History: Social History  Substance Use Topics  . Smoking status: Former Smoker -- 1.00 packs/day for 5 years    Types: Cigarettes  . Smokeless tobacco: Never Used     Comment: "quit smoking in the 1960's"  . Alcohol Use: No   Additional social history:  Please also refer to relevant sections of EMR.  Family History: Family History  Problem Relation Age of Onset  . Heart disease Mother   . Diabetes Mother     Allergies and Medications: No Known Allergies No current facility-administered medications on file prior to encounter.   Current Outpatient Prescriptions on File Prior to Encounter  Medication Sig Dispense Refill  . amLODipine (NORVASC) 10 MG tablet Take 1 tablet (10 mg total) by mouth daily. 3 tablet 0  . aspirin EC 81 MG tablet Take 81 mg by mouth daily.    . benzonatate (TESSALON) 100 MG capsule Take 1 capsule (100 mg total) by mouth 3 (three) times daily as needed for cough. 20 capsule 0  . calcitRIOL (ROCALTROL) 0.25 MCG capsule Take 1 capsule (0.25 mcg total) by mouth daily. 30 capsule 0  . calcium acetate (PHOSLO) 667 MG capsule Take 2 capsules (1,334  mg total) by mouth 3 (three) times daily with meals. 90 capsule 0  . carvedilol (COREG) 6.25 MG tablet Take 1 tablet (6.25 mg total) by mouth 2 (two) times daily with a meal. (Patient not taking: Reported on 03/10/2015) 6 tablet 0  . cloNIDine (CATAPRES) 0.1 MG tablet Take 1 tablet (0.1 mg total) by mouth 3 (three) times daily. 9 tablet 0  . glipiZIDE (GLUCOTROL) 10 MG tablet Take 10 mg by mouth daily.  1  . hydrALAZINE (APRESOLINE) 25 MG tablet Take 1 tablet (25 mg total) by mouth every 8 (eight) hours. 9 tablet 0  . isosorbide mononitrate (IMDUR) 60  MG 24 hr tablet Take 60 mg by mouth every morning.  2  . multivitamin (RENA-VIT) TABS tablet Take 1 tablet by mouth at bedtime. 30 tablet 0  . traMADol (ULTRAM) 50 MG tablet Take 1 tablet (50 mg total) by mouth every 12 (twelve) hours as needed for moderate pain (please give one tab prior to HD). 20 tablet 0    Objective: BP 161/90 mmHg  Pulse 80  Resp 18  SpO2 100% Exam: General: Patient lying in bed, NAD  Eyes: PERRL, EOMI  ENTM: MMM.  Neck: Full ROM, no lymphadenopathy  Cardiovascular: RRR, no murmurs  Respiratory: CTAB, normal WOB  Abdomen: BS+ ttp throughout abdomen, specifically in epigastric area.  MSK: Moving all extremities, no  Skin:small macular hyperpigmented lesions in upper and lower extremities  Neuro: Equal strength in upper and lower extremities, not able to obtain reflexes due to patient compliance, CN 2-12 intact,  Psych: A&Ox3   Labs and Imaging: CBC BMET   Recent Labs Lab 06/14/15 1355  WBC 6.0  HGB 12.0*  HCT 35.8*  PLT 130*    Recent Labs Lab 06/14/15 1355  NA 131*  K 4.4  CL 96*  CO2 22  BUN 56*  CREATININE 9.05*  GLUCOSE 129*  CALCIUM 8.1*     Troponin <0.03   EKG - with no ST elevation or depression, possible left atrial enlargement   Dg Chest Port 1 View  06/14/2015  CLINICAL DATA:  Chest pain. Short of breath. Hypertension. CHF. Pneumonia. EXAM: PORTABLE CHEST 1 VIEW COMPARISON:   03/18/2015 CT and 03/13/2015 plain film. FINDINGS: Numerous leads and wires project over the chest. Dialysis catheter with tips at cavoatrial junction and high right atrium. Midline trachea. Prior median sternotomy. Pacer/AICD device. Cardiomegaly accentuated by AP portable technique. No pleural effusion or pneumothorax. Low lung volumes with resultant pulmonary interstitial prominence. IMPRESSION: No acute cardiopulmonary disease. Cardiomegaly without congestive failure. Electronically Signed   By: Abigail Miyamoto M.D.   On: 06/14/2015 14:07    Asiyah Cletis Media, MD 06/14/2015, 3:26 PM PGY-1, [place] Intern pager: [phone], text pages welcome  I agree with the above evaluation, assessment, and plan. Any correctional changes can be noted in [facility].   Aquilla Hacker, MD Family Medicine Resident - PGY 2

## 2015-06-14 NOTE — ED Notes (Signed)
MD at bedside. 

## 2015-06-14 NOTE — ED Notes (Addendum)
Pt to ED via [facility] from dialysis after having completed only 20 min of dialysis.  Pt began experiencing sob.  When EMS arrived pt was in bigeminy.  Pt soon had 4 of wide complex tachycardia, became apneic and unresponsive for several minutes before returning to nsr.  BP 220/110.  Upon arrival, pt ao x 4.  C/o pain from collar bone to feet, numbness all over and blurred vision.  Given 3247 chewable asa by ems.

## 2015-06-14 NOTE — ED Provider Notes (Signed)
CSN: [id number]     Arrival date & time 06/14/15  1338 History   First MD Initiated Contact with Patient 06/14/15 1341     Chief Complaint  Patient presents with  . Chest Pain   HPI  Patient presents with concern of chest pain. By the time of ED arrival the pain has subsided substantially, but is minimally present, his anterior, pressure-like. Patient also is concern for weakness in all 4 extremities, though this seems chronic. Patient was at dialysis, completed approximately 20 minutes he began have his chest pain. At the time is found to be hypertensive, to [id number] systolic. EMS reports the patient had a decreased hypertension with provision of medication in route Patient states that prior to dialysis secondary weakness he had been in his usual state of health.  Past Medical History  Diagnosis Date  . Hypertension   . Hyperlipemia   . Edema   . 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 Obesity   . Ventricular tachycardia ([place])   . AICD (automatic cardioverter/defibrillator) present   . CHF (congestive heart failure) ([place])   . Myocardial infarction ([place]) 03/2013  . Pneumonia 2016  . History of blood transfusion 1950's    "related to pinal menigitis; HAD 16 OPERATIONS TOTAL"  . Scabies infestation 06/29/2014  . Shortness of breath dyspnea   . Type II diabetes mellitus ([place])     type 2  . CKD (chronic kidney disease), stage III    Past Surgical History  Procedure Laterality Date  . Implantable cardioverter defibrillator implant  03/24/2013    STJ single chamber ICD implanted by Dr Caryl Comes for secondary prevention  . Left heart catheterization with coronary/graft angiogram  03/07/2013    Procedure: LEFT HEART CATHETERIZATION WITH [name];  Surgeon: Clent Demark, MD;  Location: [facility] CATH LAB;  Service: Cardiovascular;;  . Implantable cardioverter defibrillator implant N/A 03/24/2013    Procedure: IMPLANTABLE CARDIOVERTER DEFIBRILLATOR IMPLANT;  Surgeon:  Deboraha Sprang, MD;  Location: [facility] CATH LAB;  Service: Cardiovascular;  Laterality: N/A;  . Cardiac catheterization    . Head surgery  1950'S    "for spinal menigitis; HAD 16 OPERATIONS TOTAL"  . Back surgery  1950's    "for spinal menigitis; HAD 16 OPERATIONS TOTAL"  . 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 Coronary artery bypass graft  1999    cabg x4  . Insertion of dialysis catheter N/A 03/13/2015    Procedure: INSERTION OF TUNNELED DIALYSIS CATHETER;  Surgeon: Conrad Le Flore, MD;  Location: [place];  Service: Vascular;  Laterality: N/A;  . Av fistula placement Right 03/18/2015    Procedure: BRACHIOCEPHALIC ARTERIOVENOUS (AV) FISTULA CREATION;  Surgeon: Angelia Mould, MD;  Location: [facility] OR;  Service: Vascular;  Laterality: Right;   Family History  Problem Relation Age of Onset  . Heart disease Mother   . Diabetes Mother    Social History  Substance Use Topics  . Smoking status: Former Smoker -- 1.00 packs/day for 5 years    Types: Cigarettes  . Smokeless tobacco: Never Used     Comment: "quit smoking in the 1960's"  . Alcohol Use: No    Review of Systems  Constitutional:       Per HPI, otherwise negative  HENT:       Per HPI, otherwise negative  Respiratory:       Per HPI, otherwise negative  Cardiovascular:  Per HPI, otherwise negative  Gastrointestinal: Negative for vomiting.  Endocrine:       Negative aside from HPI  Genitourinary:       Neg aside from HPI   Musculoskeletal:       Per HPI, otherwise negative  Skin: Negative.   Allergic/Immunologic: Positive for immunocompromised state.  Neurological: Positive for weakness and numbness. Negative for syncope.      Allergies  Review of patient's allergies indicates no known allergies.  Home Medications   Prior to Admission medications   Medication Sig Start Date End Date Taking? Authorizing Provider  amLODipine (NORVASC) 10 MG tablet Take 1 tablet (10 mg total)  by mouth daily. 08/08/14   Theodis Blaze, MD  aspirin EC 81 MG tablet Take 81 mg by mouth daily.    Historical Provider, MD  benzonatate (TESSALON) 100 MG capsule Take 1 capsule (100 mg total) by mouth 3 (three) times daily as needed for cough. 03/20/15   Maryann Mikhail, DO  calcitRIOL (ROCALTROL) 0.25 MCG capsule Take 1 capsule (0.25 mcg total) by mouth daily. 03/20/15   Maryann Mikhail, DO  calcium acetate (PHOSLO) 667 MG capsule Take 2 capsules (1,334 mg total) by mouth 3 (three) times daily with meals. 03/20/15   Maryann Mikhail, DO  carvedilol (COREG) 6.25 MG tablet Take 1 tablet (6.25 mg total) by mouth 2 (two) times daily with a meal. Patient not taking: Reported on 03/10/2015 08/08/14   Theodis Blaze, MD  cloNIDine (CATAPRES) 0.1 MG tablet Take 1 tablet (0.1 mg total) by mouth 3 (three) times daily. 08/08/14   Theodis Blaze, MD  glipiZIDE (GLUCOTROL) 10 MG tablet Take 10 mg by mouth daily. 02/15/15   Historical Provider, MD  hydrALAZINE (APRESOLINE) 25 MG tablet Take 1 tablet (25 mg total) by mouth every 8 (eight) hours. 08/08/14   Theodis Blaze, MD  isosorbide mononitrate (IMDUR) 60 MG 24 hr tablet Take 60 mg by mouth every morning. 02/15/15   Historical Provider, MD  multivitamin (RENA-VIT) TABS tablet Take 1 tablet by mouth at bedtime. 03/20/15   Maryann Mikhail, DO  traMADol (ULTRAM) 50 MG tablet Take 1 tablet (50 mg total) by mouth every 12 (twelve) hours as needed for moderate pain (please give one tab prior to HD). 03/20/15   Maryann Mikhail, DO   BP 161/90 mmHg  Pulse 80  Resp 18  SpO2 100% Physical Exam  Constitutional: He is oriented to person, place, and time. He appears well-developed. No distress.  HENT:  Head: Normocephalic and atraumatic.  Eyes: Conjunctivae and EOM are normal.  Cardiovascular: Normal rate and regular rhythm.   Pulmonary/Chest: Effort normal. No stridor. No respiratory distress.    Abdominal: He exhibits no distension.  Musculoskeletal: He exhibits no edema.    Neurological: He is alert and oriented to person, place, and time. He displays no atrophy and no tremor. He exhibits normal muscle tone. He displays no seizure activity.  Patient moves all extremity spontaneously, has 5/5 strength in all 4 extremities, and has subjective sensory, but no objective findings.  Skin: Skin is warm and dry.  Psychiatric: He has a normal mood and affect.  Nursing note and vitals reviewed.   ED Course  Procedures (including critical care time) Labs Review Labs Reviewed  CBC - Abnormal; Notable for the following:    RBC 4.10 (*)    Hemoglobin 12.0 (*)    HCT 35.8 (*)    Platelets 130 (*)    All other components within normal  limits  COMPREHENSIVE METABOLIC PANEL - Abnormal; Notable for the following:    Sodium 131 (*)    Chloride 96 (*)    Glucose, Bld 129 (*)    BUN 56 (*)    Creatinine, Ser 9.05 (*)    Calcium 8.1 (*)    Total Protein 9.2 (*)    Albumin 3.2 (*)    AST 11 (*)    GFR calc non Af Amer 6 (*)    GFR calc Af Amer 6 (*)    All other components within normal limits  PROTIME-INR - Abnormal; Notable for the following:    Prothrombin Time 18.9 (*)    INR 1.58 (*)    All other components within normal limits  MAGNESIUM  TROPONIN I    Imaging Review Dg Chest Port 1 View  06/14/2015  CLINICAL DATA:  Chest pain. Short of breath. Hypertension. CHF. Pneumonia. EXAM: PORTABLE CHEST 1 VIEW COMPARISON:  03/18/2015 CT and 03/13/2015 plain film. FINDINGS: Numerous leads and wires project over the chest. Dialysis catheter with tips at cavoatrial junction and high right atrium. Midline trachea. Prior median sternotomy. Pacer/AICD device. Cardiomegaly accentuated by AP portable technique. No pleural effusion or pneumothorax. Low lung volumes with resultant pulmonary interstitial prominence. IMPRESSION: No acute cardiopulmonary disease. Cardiomegaly without congestive failure. Electronically Signed   By: Abigail Miyamoto M.D.   On: 06/14/2015 14:07   I have  personally reviewed and evaluated these images and lab results as part of my medical decision-making.   EKG Interpretation   Date/Time:  Monday Jun 14 2015 13:46:13 EDT Ventricular Rate:  87 PR Interval:  162 QRS Duration: 84 QT Interval:  374 QTC Calculation: 450 R Axis:   8 Text Interpretation:  Sinus rhythm Probable left atrial enlargement  Anteroseptal infarct, age indeterminate Baseline wander in lead(s) I II  aVR V1 V2 V3 V4 V5 V6 Abnormal ekg Confirmed by Carmin Muskrat  MD  [phone]) on 06/14/2015 1:52:23 PM       Chart review notable for ongoing episodes with weakness in extremities, and this seems chronic. On repeat exam the patient has no chest pain, continues to describe numbness in all 4 extremities.   I discussed the patient's case with our admitting team. MDM   Final diagnoses:  Chest pain, unspecified chest pain type  Numbness   Patient with multiple medical issues including dialysis, MGUS now presents with numbness, chest pain. Here the patient's pain has resolved, initial troponin is normal, and EKG is nonischemic. With patient's risk profile, however, he required admission for further evaluation, management. Patient's description of numbness seems chronic, with no evidence for new neurologic loss of function.   Carmin Muskrat, MD 06/14/15 1556

## 2015-06-15 ENCOUNTER — Encounter (HOSPITAL_COMMUNITY): Payer: Self-pay | Admitting: General Surgery

## 2015-06-15 ENCOUNTER — Observation Stay (HOSPITAL_COMMUNITY): Payer: Medicaid Other

## 2015-06-15 DIAGNOSIS — K851 Biliary acute pancreatitis without necrosis or infection: Secondary | ICD-10-CM | POA: Diagnosis present

## 2015-06-15 DIAGNOSIS — R200 Anesthesia of skin: Secondary | ICD-10-CM | POA: Diagnosis not present

## 2015-06-15 DIAGNOSIS — R0789 Other chest pain: Secondary | ICD-10-CM | POA: Diagnosis present

## 2015-06-15 DIAGNOSIS — I5042 Chronic combined systolic (congestive) and diastolic (congestive) heart failure: Secondary | ICD-10-CM | POA: Diagnosis present

## 2015-06-15 DIAGNOSIS — E1122 Type 2 diabetes mellitus with diabetic chronic kidney disease: Secondary | ICD-10-CM | POA: Diagnosis present

## 2015-06-15 DIAGNOSIS — K859 Acute pancreatitis without necrosis or infection, unspecified: Secondary | ICD-10-CM | POA: Diagnosis not present

## 2015-06-15 DIAGNOSIS — E1140 Type 2 diabetes mellitus with diabetic neuropathy, unspecified: Secondary | ICD-10-CM | POA: Diagnosis present

## 2015-06-15 DIAGNOSIS — E785 Hyperlipidemia, unspecified: Secondary | ICD-10-CM | POA: Diagnosis present

## 2015-06-15 DIAGNOSIS — Z95810 Presence of automatic (implantable) cardiac defibrillator: Secondary | ICD-10-CM | POA: Diagnosis not present

## 2015-06-15 DIAGNOSIS — K8012 Calculus of gallbladder with acute and chronic cholecystitis without obstruction: Secondary | ICD-10-CM | POA: Diagnosis present

## 2015-06-15 DIAGNOSIS — R1011 Right upper quadrant pain: Secondary | ICD-10-CM

## 2015-06-15 DIAGNOSIS — Z4659 Encounter for fitting and adjustment of other gastrointestinal appliance and device: Secondary | ICD-10-CM | POA: Diagnosis not present

## 2015-06-15 DIAGNOSIS — R079 Chest pain, unspecified: Secondary | ICD-10-CM

## 2015-06-15 DIAGNOSIS — N2581 Secondary hyperparathyroidism of renal origin: Secondary | ICD-10-CM | POA: Diagnosis present

## 2015-06-15 DIAGNOSIS — Z982 Presence of cerebrospinal fluid drainage device: Secondary | ICD-10-CM | POA: Diagnosis not present

## 2015-06-15 DIAGNOSIS — Z87891 Personal history of nicotine dependence: Secondary | ICD-10-CM | POA: Diagnosis not present

## 2015-06-15 DIAGNOSIS — D638 Anemia in other chronic diseases classified elsewhere: Secondary | ICD-10-CM | POA: Diagnosis present

## 2015-06-15 DIAGNOSIS — I132 Hypertensive heart and chronic kidney disease with heart failure and with stage 5 chronic kidney disease, or end stage renal disease: Secondary | ICD-10-CM | POA: Diagnosis present

## 2015-06-15 DIAGNOSIS — R140 Abdominal distension (gaseous): Secondary | ICD-10-CM | POA: Diagnosis not present

## 2015-06-15 DIAGNOSIS — I255 Ischemic cardiomyopathy: Secondary | ICD-10-CM | POA: Diagnosis present

## 2015-06-15 DIAGNOSIS — B1920 Unspecified viral hepatitis C without hepatic coma: Secondary | ICD-10-CM | POA: Diagnosis present

## 2015-06-15 DIAGNOSIS — G40909 Epilepsy, unspecified, not intractable, without status epilepticus: Secondary | ICD-10-CM | POA: Diagnosis present

## 2015-06-15 DIAGNOSIS — Z8674 Personal history of sudden cardiac arrest: Secondary | ICD-10-CM | POA: Diagnosis not present

## 2015-06-15 DIAGNOSIS — Z951 Presence of aortocoronary bypass graft: Secondary | ICD-10-CM | POA: Diagnosis not present

## 2015-06-15 DIAGNOSIS — K913 Postprocedural intestinal obstruction: Secondary | ICD-10-CM | POA: Diagnosis not present

## 2015-06-15 DIAGNOSIS — N184 Chronic kidney disease, stage 4 (severe): Secondary | ICD-10-CM | POA: Diagnosis not present

## 2015-06-15 DIAGNOSIS — K7460 Unspecified cirrhosis of liver: Secondary | ICD-10-CM | POA: Diagnosis present

## 2015-06-15 DIAGNOSIS — E871 Hypo-osmolality and hyponatremia: Secondary | ICD-10-CM | POA: Diagnosis present

## 2015-06-15 DIAGNOSIS — K430 Incisional hernia with obstruction, without gangrene: Secondary | ICD-10-CM | POA: Diagnosis not present

## 2015-06-15 DIAGNOSIS — K8020 Calculus of gallbladder without cholecystitis without obstruction: Secondary | ICD-10-CM | POA: Diagnosis present

## 2015-06-15 DIAGNOSIS — I25118 Atherosclerotic heart disease of native coronary artery with other forms of angina pectoris: Secondary | ICD-10-CM | POA: Diagnosis present

## 2015-06-15 DIAGNOSIS — Z0189 Encounter for other specified special examinations: Secondary | ICD-10-CM | POA: Diagnosis not present

## 2015-06-15 DIAGNOSIS — H540 Blindness, both eyes: Secondary | ICD-10-CM | POA: Diagnosis present

## 2015-06-15 DIAGNOSIS — Z992 Dependence on renal dialysis: Secondary | ICD-10-CM | POA: Diagnosis not present

## 2015-06-15 DIAGNOSIS — N186 End stage renal disease: Secondary | ICD-10-CM | POA: Diagnosis present

## 2015-06-15 LAB — BASIC METABOLIC PANEL
Anion gap: 11 (ref 5–15)
BUN: 60 mg/dL — ABNORMAL HIGH (ref 6–20)
CALCIUM: 7.8 mg/dL — AB (ref 8.9–10.3)
CO2: 24 mmol/L (ref 22–32)
CREATININE: 9.42 mg/dL — AB (ref 0.61–1.24)
Chloride: 95 mmol/L — ABNORMAL LOW (ref 101–111)
GFR, EST AFRICAN AMERICAN: 6 mL/min — AB (ref >60)
GFR, EST NON AFRICAN AMERICAN: 5 mL/min — AB (ref >60)
GLUCOSE: 139 mg/dL — AB (ref 65–99)
Potassium: 4.3 mmol/L (ref 3.5–5.1)
Sodium: 130 mmol/L — ABNORMAL LOW (ref 135–145)

## 2015-06-15 LAB — CBC
HEMATOCRIT: 33.2 % — AB (ref 39.0–52.0)
Hemoglobin: 10.7 g/dL — ABNORMAL LOW (ref 13.0–17.0)
MCH: 28.8 pg (ref 26.0–34.0)
MCHC: 32.2 g/dL (ref 30.0–36.0)
MCV: 89.2 fL (ref 78.0–100.0)
PLATELETS: 127 10*3/uL — AB (ref 150–400)
RBC: 3.72 MIL/uL — ABNORMAL LOW (ref 4.22–5.81)
RDW: 12.7 % (ref 11.5–15.5)
WBC: 6.3 10*3/uL (ref 4.0–10.5)

## 2015-06-15 LAB — GLUCOSE, CAPILLARY
GLUCOSE-CAPILLARY: 67 mg/dL (ref 65–99)
GLUCOSE-CAPILLARY: 76 mg/dL (ref 65–99)
Glucose-Capillary: 114 mg/dL — ABNORMAL HIGH (ref 65–99)

## 2015-06-15 LAB — TROPONIN I: Troponin I: 0.04 ng/mL — ABNORMAL HIGH (ref <0.031)

## 2015-06-15 LAB — RPR: RPR Ser Ql: NONREACTIVE

## 2015-06-15 MED ORDER — PENTAFLUOROPROP-TETRAFLUOROETH EX AERO: 1.0000 "application " | INHALATION_SPRAY | CUTANEOUS | Status: DC | PRN

## 2015-06-15 MED ORDER — SODIUM CHLORIDE 0.9 % IV SOLN: 100.0000 mL | INTRAVENOUS | Status: DC | PRN

## 2015-06-15 MED ORDER — HEPARIN SODIUM (PORCINE) 1000 UNIT/ML DIALYSIS: 20.0000 [IU]/kg | INTRAMUSCULAR | Status: DC | PRN

## 2015-06-15 MED ORDER — ALTEPLASE 2 MG IJ SOLR: 2.0000 mg | Freq: Once | INTRAMUSCULAR | Status: DC | PRN

## 2015-06-15 MED ORDER — FENTANYL CITRATE (PF) 100 MCG/2ML IJ SOLN
12.5000 ug | INTRAMUSCULAR | Status: DC | PRN
  Administered 2015-06-14 – 2015-06-18 (×5): 12.5 ug via INTRAVENOUS
  Filled 2015-06-15 (×4): qty 2

## 2015-06-15 MED ORDER — LIDOCAINE HCL (PF) 1 % IJ SOLN: 5.0000 mL | INTRAMUSCULAR | Status: DC | PRN

## 2015-06-15 MED ORDER — LIDOCAINE-PRILOCAINE 2.5-2.5 % EX CREA: 1.0000 "application " | TOPICAL_CREAM | CUTANEOUS | Status: DC | PRN

## 2015-06-15 MED ORDER — GI COCKTAIL ~~LOC~~: 30.0000 mL | Freq: Once | ORAL | Status: DC

## 2015-06-15 MED ORDER — RENA-VITE PO TABS
1.0000 | ORAL_TABLET | Freq: Every day | ORAL | Status: DC
  Administered 2015-06-15 – 2015-06-30 (×9): 1 via ORAL
  Filled 2015-06-15 (×14): qty 1

## 2015-06-15 MED ORDER — CLONIDINE HCL 0.1 MG PO TABS
0.1000 mg | ORAL_TABLET | Freq: Two times a day (BID) | ORAL | Status: DC | PRN
  Filled 2015-06-15: qty 1

## 2015-06-15 MED ORDER — CINACALCET HCL 30 MG PO TABS
30.0000 mg | ORAL_TABLET | Freq: Every day | ORAL | Status: DC
  Administered 2015-06-16 – 2015-06-30 (×6): 30 mg via ORAL
  Filled 2015-06-15 (×7): qty 1

## 2015-06-15 MED ORDER — CALCITRIOL 0.5 MCG PO CAPS: 1.0000 ug | ORAL_CAPSULE | ORAL | Status: DC

## 2015-06-15 MED ORDER — CALCIUM ACETATE (PHOS BINDER) 667 MG PO CAPS
1334.0000 mg | ORAL_CAPSULE | Freq: Three times a day (TID) | ORAL | Status: DC
  Administered 2015-06-16 – 2015-07-01 (×17): 1334 mg via ORAL
  Filled 2015-06-15 (×22): qty 2

## 2015-06-15 MED ORDER — HEPARIN SODIUM (PORCINE) 1000 UNIT/ML DIALYSIS: 1000.0000 [IU] | INTRAMUSCULAR | Status: DC | PRN

## 2015-06-15 NOTE — Consult Note (Signed)
Reason for Consult: gallstones, elevated lipase Referring Physician: Dr. Dorcas Mcmurray    HPI: Eric [name] is a 60 year old male with a history of HTN, CHD, VF arrest in 2015, AICD, MI/CABG, ESRD, DM II, hepatitis C/cirrhosis.  He presented to [facility] yesterday after becoming unresponsive during dialysis.  He reports coming too in the ambulance and having chest pressure.  He also reports developing epigastric abdominal pain and bloating the night before.  Over the past several weeks, he endorses to epigastric bloating, questionable pain which wakes him up at night.  Denies post prandial pain.  Denies nausea, vomiting, fever, chills or sweats.  On admission troponin was negative, subsequent have been .04.  ACS has been ruled out.   CT of abdomen and pelvis revealed a contracted gallbladder with stones or sludge, no pericholecystic fluid, no findings of pancreatitis on CT scan.  Lipase was 195.  Denies any recent alcohol use. He continues to have chest pain and abdominal bloating and epigastric abdominal pain.  He has been NPO.  LFTs are normal.  INR 1.58 on admission.  No previous abdominal surgeries.   Past Medical History  Diagnosis Date  . Hypertension   . Hyperlipemia   . Edema   . 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 Obesity   . Ventricular tachycardia ([place])   . AICD (automatic cardioverter/defibrillator) present   . CHF (congestive heart failure) ([place])   . Myocardial infarction ([place]) 03/2013  . Pneumonia 2016  . History of blood transfusion 1950's    "related to pinal menigitis; HAD 16 OPERATIONS TOTAL"  . Scabies infestation 06/29/2014  . Shortness of breath dyspnea   . Type II diabetes mellitus ([place])     type 2  . CKD (chronic kidney disease), stage III     Past Surgical History  Procedure Laterality Date  . Implantable cardioverter defibrillator implant  03/24/2013    STJ single chamber ICD implanted by Dr Caryl Comes for secondary prevention  . Left heart  catheterization with coronary/graft angiogram  03/07/2013    Procedure: LEFT HEART CATHETERIZATION WITH [name];  Surgeon: Clent Demark, MD;  Location: [facility] CATH LAB;  Service: Cardiovascular;;  . Implantable cardioverter defibrillator implant N/A 03/24/2013    Procedure: IMPLANTABLE CARDIOVERTER DEFIBRILLATOR IMPLANT;  Surgeon: Deboraha Sprang, MD;  Location: [facility] CATH LAB;  Service: Cardiovascular;  Laterality: N/A;  . Cardiac catheterization    . Head surgery  1950'S    "for spinal menigitis; HAD 16 OPERATIONS TOTAL"  . Back surgery  1950's    "for spinal menigitis; HAD 16 OPERATIONS TOTAL"  . 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 Coronary artery bypass graft  1999    cabg x4  . Insertion of dialysis catheter N/A 03/13/2015    Procedure: INSERTION OF TUNNELED DIALYSIS CATHETER;  Surgeon: Conrad Boykin, MD;  Location: [place];  Service: Vascular;  Laterality: N/A;  . Av fistula placement Right 03/18/2015    Procedure: BRACHIOCEPHALIC ARTERIOVENOUS (AV) FISTULA CREATION;  Surgeon: Angelia Mould, MD;  Location: [facility] OR;  Service: Vascular;  Laterality: Right;    Family History  Problem Relation Age of Onset  . Heart disease Mother   . Diabetes Mother     Social History:  reports that he has quit smoking. His smoking use included Cigarettes. He has a 5 pack-year smoking history. He has never used smokeless tobacco. He reports that he does not drink  alcohol or use illicit drugs.  Allergies: No Known Allergies  Medications:  Scheduled Meds: . amLODipine  10 mg Oral Daily  . calcitRIOL  0.25 mcg Oral Daily  . carvedilol  6.25 mg Oral BID WC  . cloNIDine  0.1 mg Oral TID  . gi cocktail  30 mL Oral Once  . heparin  5,000 Units Subcutaneous Q8H  . hydrALAZINE  25 mg Oral Q8H  . insulin aspart  0-9 Units Subcutaneous TID WC  . isosorbide mononitrate  60 mg Oral Daily   Continuous Infusions:  PRN Meds:.acetaminophen, fentaNYL  (SUBLIMAZE) injection, gi cocktail, ondansetron (ZOFRAN) IV   Results for orders placed or performed during the hospital encounter of 06/14/15 (from the past 48 hour(s))  CBC     Status: Abnormal   Collection Time: 06/14/15  1:55 PM  Result Value Ref Range   WBC 6.0 4.0 - 10.5 K/uL   RBC 4.10 (L) 4.22 - 5.81 MIL/uL   Hemoglobin 12.0 (L) 13.0 - 17.0 g/dL   HCT 10.2 (L) 77.6 - 01.3 %   MCV 87.3 78.0 - 100.0 fL   MCH 29.3 26.0 - 34.0 pg   MCHC 33.5 30.0 - 36.0 g/dL   RDW 28.9 55.8 - 11.5 %   Platelets 130 (L) 150 - 400 K/uL  Comprehensive metabolic panel     Status: Abnormal   Collection Time: 06/14/15  1:55 PM  Result Value Ref Range   Sodium 131 (L) 135 - 145 mmol/L   Potassium 4.4 3.5 - 5.1 mmol/L   Chloride 96 (L) 101 - 111 mmol/L   CO2 22 22 - 32 mmol/L   Glucose, Bld 129 (H) 65 - 99 mg/dL   BUN 56 (H) 6 - 20 mg/dL   Creatinine, Ser 3.85 (H) 0.61 - 1.24 mg/dL   Calcium 8.1 (L) 8.9 - 10.3 mg/dL   Total Protein 9.2 (H) 6.5 - 8.1 g/dL   Albumin 3.2 (L) 3.5 - 5.0 g/dL   AST 11 (L) 15 - 41 U/L   ALT 18 17 - 63 U/L   Alkaline Phosphatase 66 38 - 126 U/L   Total Bilirubin 0.6 0.3 - 1.2 mg/dL   GFR calc non Af Amer 6 (L) >60 mL/min   GFR calc Af Amer 6 (L) >60 mL/min    Comment: (NOTE) The eGFR has been calculated using the CKD EPI equation. This calculation has not been validated in all clinical situations. eGFR's persistently <60 mL/min signify possible Chronic Kidney Disease.    Anion gap 13 5 - 15  Magnesium     Status: None   Collection Time: 06/14/15  1:55 PM  Result Value Ref Range   Magnesium 2.4 1.7 - 2.4 mg/dL  Protime-INR     Status: Abnormal   Collection Time: 06/14/15  1:55 PM  Result Value Ref Range   Prothrombin Time 18.9 (H) 11.6 - 15.2 seconds   INR 1.58 (H) 0.00 - 1.49  Troponin I (order at [facility])     Status: None   Collection Time: 06/14/15  1:55 PM  Result Value Ref Range   Troponin I 0.03 <0.031 ng/mL    Comment:        NO INDICATION OF MYOCARDIAL  INJURY.   Brain natriuretic peptide     Status: None   Collection Time: 06/14/15  1:55 PM  Result Value Ref Range   B Natriuretic Peptide 75.2 0.0 - 100.0 pg/mL  RPR     Status: None   Collection Time: 06/14/15  6:16 PM  Result Value Ref Range   RPR Ser Ql Non Reactive Non Reactive    Comment: (NOTE) Performed At: [facility] [place], [place] [id number] Lindon Romp MD [id number]   Vitamin B12     Status: None   Collection Time: 06/14/15  6:16 PM  Result Value Ref Range   Vitamin B-12 566 180 - 914 pg/mL    Comment: (NOTE) This assay is not validated for testing neonatal or myeloproliferative syndrome specimens for Vitamin B12 levels.   Lipase, blood     Status: Abnormal   Collection Time: 06/14/15  6:16 PM  Result Value Ref Range   Lipase 195 (H) 11 - 51 U/L  Troponin I-serum (0, 3, 6 hours)     Status: Abnormal   Collection Time: 06/14/15  6:17 PM  Result Value Ref Range   Troponin I 0.04 (H) <0.031 ng/mL    Comment:        PERSISTENTLY INCREASED TROPONIN VALUES IN THE RANGE OF 0.04-0.49 ng/mL CAN BE SEEN IN:       -UNSTABLE ANGINA       -CONGESTIVE HEART FAILURE       -MYOCARDITIS       -CHEST TRAUMA       -ARRYHTHMIAS       -LATE PRESENTING MYOCARDIAL INFARCTION       -COPD   CLINICAL FOLLOW-UP RECOMMENDED.   Glucose, capillary     Status: Abnormal   Collection Time: 06/14/15  8:55 PM  Result Value Ref Range   Glucose-Capillary 152 (H) 65 - 99 mg/dL  Troponin I     Status: Abnormal   Collection Time: 06/14/15  9:30 PM  Result Value Ref Range   Troponin I 0.04 (H) <0.031 ng/mL    Comment:        PERSISTENTLY INCREASED TROPONIN VALUES IN THE RANGE OF 0.04-0.49 ng/mL CAN BE SEEN IN:       -UNSTABLE ANGINA       -CONGESTIVE HEART FAILURE       -MYOCARDITIS       -CHEST TRAUMA       -ARRYHTHMIAS       -LATE PRESENTING MYOCARDIAL INFARCTION       -COPD   CLINICAL FOLLOW-UP RECOMMENDED.   Phosphorus     Status: Abnormal    Collection Time: 06/14/15  9:30 PM  Result Value Ref Range   Phosphorus 6.1 (H) 2.5 - 4.6 mg/dL  CBC     Status: Abnormal   Collection Time: 06/15/15 12:17 AM  Result Value Ref Range   WBC 6.3 4.0 - 10.5 K/uL   RBC 3.72 (L) 4.22 - 5.81 MIL/uL   Hemoglobin 10.7 (L) 13.0 - 17.0 g/dL   HCT 33.2 (L) 39.0 - 52.0 %   MCV 89.2 78.0 - 100.0 fL   MCH 28.8 26.0 - 34.0 pg   MCHC 32.2 30.0 - 36.0 g/dL   RDW 12.7 11.5 - 15.5 %   Platelets 127 (L) 150 - 400 K/uL  Basic metabolic panel     Status: Abnormal   Collection Time: 06/15/15 12:17 AM  Result Value Ref Range   Sodium 130 (L) 135 - 145 mmol/L   Potassium 4.3 3.5 - 5.1 mmol/L   Chloride 95 (L) 101 - 111 mmol/L   CO2 24 22 - 32 mmol/L   Glucose, Bld 139 (H) 65 - 99 mg/dL   BUN 60 (H) 6 - 20 mg/dL   Creatinine, Ser 9.42 (H) 0.61 -  1.24 mg/dL   Calcium 7.8 (L) 8.9 - 10.3 mg/dL   GFR calc non Af Amer 5 (L) >60 mL/min   GFR calc Af Amer 6 (L) >60 mL/min    Comment: (NOTE) The eGFR has been calculated using the CKD EPI equation. This calculation has not been validated in all clinical situations. eGFR's persistently <60 mL/min signify possible Chronic Kidney Disease.    Anion gap 11 5 - 15  Troponin I     Status: Abnormal   Collection Time: 06/15/15 12:17 AM  Result Value Ref Range   Troponin I 0.04 (H) <0.031 ng/mL    Comment:        PERSISTENTLY INCREASED TROPONIN VALUES IN THE RANGE OF 0.04-0.49 ng/mL CAN BE SEEN IN:       -UNSTABLE ANGINA       -CONGESTIVE HEART FAILURE       -MYOCARDITIS       -CHEST TRAUMA       -ARRYHTHMIAS       -LATE PRESENTING MYOCARDIAL INFARCTION       -COPD   CLINICAL FOLLOW-UP RECOMMENDED.   Glucose, capillary     Status: None   Collection Time: 06/15/15  6:04 AM  Result Value Ref Range   Glucose-Capillary 67 65 - 99 mg/dL    Ct Abdomen Pelvis Wo Contrast  06/15/2015  CLINICAL DATA:  Right upper quadrant abdominal pain.  Pancreatitis. EXAM: CT ABDOMEN AND PELVIS WITHOUT CONTRAST TECHNIQUE:  Multidetector CT imaging of the abdomen and pelvis was performed following the standard protocol without IV contrast. COMPARISON:  None. FINDINGS: Lower chest: The heart is enlarged. There are coronary artery calcifications. Pacemaker partially included. Minimal dependent atelectasis. Prominent size. No evidence of focal lesion allowing for lack contrast. Liver:  No focal lesion allowing for lack contrast.  Prominent size. Hepatobiliary: Partially distended. Intraluminal hyperdensity may be stones or sludge. No pericholecystic inflammation. Pancreas: No peripancreatic inflammation. No ductal dilatation. Pancreatic tail appears truncated. Spleen: Prominent size measuring 13.9 cm cranial caudal. Adrenal glands: No nodule. Kidneys: Atrophic right kidney with compensatory hypertrophy of the right kidney. No hydronephrosis. No urolithiasis. Stomach/Bowel: Stomach physiologically distended. Small hiatal hernia. There are no dilated or thickened small bowel loops. Moderate volume of stool throughout the colon without colonic wall thickening. Distal colonic diverticulosis without diverticulitis. The appendix is normal. Vascular/Lymphatic: No retroperitoneal adenopathy. Abdominal aorta is normal in caliber. Aortic atherosclerosis without aneurysm. Reproductive: Prominent sized prostate gland. Bladder: Minimally distended, minimal wall thickening likely related to nondistention. Other: No free air, free fluid, or intra-abdominal fluid collection. Musculoskeletal: There are no acute or suspicious osseous abnormalities. Facet arthropathy in the spine. IMPRESSION: 1. Contracted gallbladder with stones or sludge. No pericholecystic inflammation. 2. No CT findings of pancreatitis. 3. Markedly atrophic left kidney with compensatory hypertrophy of the right kidney. 4. Additional chronic findings as described. Electronically Signed   By: Jeb Levering M.D.   On: 06/15/2015 03:41   Dg Chest Port 1 View  06/14/2015  CLINICAL  DATA:  Chest pain. Short of breath. Hypertension. CHF. Pneumonia. EXAM: PORTABLE CHEST 1 VIEW COMPARISON:  03/18/2015 CT and 03/13/2015 plain film. FINDINGS: Numerous leads and wires project over the chest. Dialysis catheter with tips at cavoatrial junction and high right atrium. Midline trachea. Prior median sternotomy. Pacer/AICD device. Cardiomegaly accentuated by AP portable technique. No pleural effusion or pneumothorax. Low lung volumes with resultant pulmonary interstitial prominence. IMPRESSION: No acute cardiopulmonary disease. Cardiomegaly without congestive failure. Electronically Signed   By: Adria Devon.D.  On: 06/14/2015 14:07    Review of Systems  Constitutional: Negative for fever, chills, weight loss, malaise/fatigue and diaphoresis.  Respiratory: Negative for cough, hemoptysis, sputum production, shortness of breath and wheezing.   Cardiovascular: Positive for chest pain and leg swelling. Negative for palpitations, orthopnea and PND.  Gastrointestinal: Positive for abdominal pain. Negative for nausea, vomiting, diarrhea, constipation, blood in stool and melena.  Neurological: Negative for dizziness, tingling, tremors, sensory change, speech change, focal weakness, seizures, loss of consciousness and weakness.   Blood pressure 140/81, pulse 80, temperature 98.2 F (36.8 C), temperature source Oral, resp. rate 18, weight 83.4 kg (183 lb 13.8 oz), SpO2 100 %. Physical Exam  Constitutional: He is oriented to person, place, and time. He appears well-developed and well-nourished. No distress.  Cardiovascular: Normal rate, regular rhythm, normal heart sounds and intact distal pulses.  Exam reveals no gallop and no friction rub.   No murmur heard. Respiratory: Effort normal and breath sounds normal. No respiratory distress. He has no wheezes. He has no rales. He exhibits no tenderness.  GI: Soft. Bowel sounds are normal. He exhibits no mass. There is no rebound and no guarding.  ttp  epigastric region, negative murphy's sign  Musculoskeletal: He exhibits edema.  Neurological: He is alert and oriented to person, place, and time.  Skin: Skin is warm and dry. No rash noted. He is not diaphoretic. No erythema. No pallor.  Psychiatric: He has a normal mood and affect. His behavior is normal. Thought content normal.    Assessment/Plan: Symptomatic cholelithiasis-no evidence of cholecystitis.  It appears he has been having symptoms for several weeks, although, he has had several abdominal US since 2015.  He would benefit from a cholecystectomy, however, his cardiac history may prohibit his surgery.  Would recommend a cardiology consultation(sees Dr. Einar Gip).  Will allow for clears, NPO after midnight. Thank you for the consult.  Will follow along.   Erby Pian ANP-BC Pager [phone] 06/15/2015, 9:57 AM

## 2015-06-15 NOTE — Consult Note (Signed)
Reason for Consult:cardiac clearance for cholecystectomy Referring Physician: Family medicine  Eric [name] is an 60 y.o. male.  [name] is 59 year old male with past medical history significant for multiple medical problems, i.e., coronary artery disease status post CABG in the past, status post V. Fib cardiac arrest noted to have small vessel disease, status post ICD,history of diastolic congestive heart failure, hypertension, diabetes mellitus, history of hepatitis C, history of cirrhosis of liver with hypoalbuminemia, end-stage renal disease on hemodialysis, seizure disorder, chronic anemia, history of chronic hydrocephalus with dilated third and lateral ventricle in the past, was admitted yesterday complaining of retrosternal chest pain described as heaviness associated with mild shortness of breath and nausea during hemodialysis..  Also complains of vague abdominal pain off and on.denies any fever or chills.  CT of the abdomen showed contracted gallbladder with gallstones/sludge.  No pericolic cystic inflammation.  Patient was also noted to have mildly elevated lipase, but no evidence of pancreatitis by CT.  EKG done in the ER showed no acute ischemic changes.  Troponin I was 0.04.Patient is noncompliant to meds and follow-up seen more than one year ago in the office Past Medical History  Diagnosis Date  . Hypertension   . Hyperlipemia   . Edema   . Cardiac arrest due to underlying cardiac condition ([place])   . Obesity   . Ventricular tachycardia ([place])   . AICD (automatic cardioverter/defibrillator) present   . CHF (congestive heart failure) ([place])   . Myocardial infarction ([place]) 03/2013  . Pneumonia 2016  . History of blood transfusion 1950's    "related to pinal menigitis; HAD 16 OPERATIONS TOTAL"  . Scabies infestation 06/29/2014  . Shortness of breath dyspnea   . Type II diabetes mellitus ([place])     type 2  . CKD (chronic kidney disease), stage III     Past Surgical History   Procedure Laterality Date  . Implantable cardioverter defibrillator implant  03/24/2013    STJ single chamber ICD implanted by Dr Caryl Comes for secondary prevention  . Left heart catheterization with coronary/graft angiogram  03/07/2013    Procedure: LEFT HEART CATHETERIZATION WITH [name];  Surgeon: Clent Demark, MD;  Location: [facility] CATH LAB;  Service: Cardiovascular;;  . Implantable cardioverter defibrillator implant N/A 03/24/2013    Procedure: IMPLANTABLE CARDIOVERTER DEFIBRILLATOR IMPLANT;  Surgeon: Deboraha Sprang, MD;  Location: [facility] CATH LAB;  Service: Cardiovascular;  Laterality: N/A;  . Cardiac catheterization    . Head surgery  1950'S    "for spinal menigitis; HAD 16 OPERATIONS TOTAL"  . Back surgery  1950's    "for spinal menigitis; HAD 16 OPERATIONS TOTAL"  . 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 Coronary artery bypass graft  1999    cabg x4  . Insertion of dialysis catheter N/A 03/13/2015    Procedure: INSERTION OF TUNNELED DIALYSIS CATHETER;  Surgeon: Conrad Pennville, MD;  Location: [place];  Service: Vascular;  Laterality: N/A;  . Av fistula placement Right 03/18/2015    Procedure: BRACHIOCEPHALIC ARTERIOVENOUS (AV) FISTULA CREATION;  Surgeon: Angelia Mould, MD;  Location: [facility] OR;  Service: Vascular;  Laterality: Right;    Family History  Problem Relation Age of Onset  . Heart disease Mother   . Diabetes Mother     Social History:  reports that he has quit smoking. His smoking use included Cigarettes. He has a 5 pack-year smoking history. He has never used smokeless tobacco. He reports that he  does not drink alcohol or use illicit drugs.  Allergies: No Known Allergies  Medications: I have reviewed the patient's current medications.  Results for orders placed or performed during the hospital encounter of 06/14/15 (from the past 48 hour(s))  CBC     Status: Abnormal   Collection Time: 06/14/15  1:55 PM  Result Value  Ref Range   WBC 6.0 4.0 - 10.5 K/uL   RBC 4.10 (L) 4.22 - 5.81 MIL/uL   Hemoglobin 12.0 (L) 13.0 - 17.0 g/dL   HCT 35.8 (L) 39.0 - 52.0 %   MCV 87.3 78.0 - 100.0 fL   MCH 29.3 26.0 - 34.0 pg   MCHC 33.5 30.0 - 36.0 g/dL   RDW 12.6 11.5 - 15.5 %   Platelets 130 (L) 150 - 400 K/uL  Comprehensive metabolic panel     Status: Abnormal   Collection Time: 06/14/15  1:55 PM  Result Value Ref Range   Sodium 131 (L) 135 - 145 mmol/L   Potassium 4.4 3.5 - 5.1 mmol/L   Chloride 96 (L) 101 - 111 mmol/L   CO2 22 22 - 32 mmol/L   Glucose, Bld 129 (H) 65 - 99 mg/dL   BUN 56 (H) 6 - 20 mg/dL   Creatinine, Ser 9.05 (H) 0.61 - 1.24 mg/dL   Calcium 8.1 (L) 8.9 - 10.3 mg/dL   Total Protein 9.2 (H) 6.5 - 8.1 g/dL   Albumin 3.2 (L) 3.5 - 5.0 g/dL   AST 11 (L) 15 - 41 U/L   ALT 18 17 - 63 U/L   Alkaline Phosphatase 66 38 - 126 U/L   Total Bilirubin 0.6 0.3 - 1.2 mg/dL   GFR calc non Af Amer 6 (L) >60 mL/min   GFR calc Af Amer 6 (L) >60 mL/min    Comment: (NOTE) The eGFR has been calculated using the CKD EPI equation. This calculation has not been validated in all clinical situations. eGFR's persistently <60 mL/min signify possible Chronic Kidney Disease.    Anion gap 13 5 - 15  Magnesium     Status: None   Collection Time: 06/14/15  1:55 PM  Result Value Ref Range   Magnesium 2.4 1.7 - 2.4 mg/dL  Protime-INR     Status: Abnormal   Collection Time: 06/14/15  1:55 PM  Result Value Ref Range   Prothrombin Time 18.9 (H) 11.6 - 15.2 seconds   INR 1.58 (H) 0.00 - 1.49  Troponin I (order at [facility])     Status: None   Collection Time: 06/14/15  1:55 PM  Result Value Ref Range   Troponin I 0.03 <0.031 ng/mL    Comment:        NO INDICATION OF MYOCARDIAL INJURY.   Brain natriuretic peptide     Status: None   Collection Time: 06/14/15  1:55 PM  Result Value Ref Range   B Natriuretic Peptide 75.2 0.0 - 100.0 pg/mL  RPR     Status: None   Collection Time: 06/14/15  6:16 PM  Result Value Ref Range    RPR Ser Ql Non Reactive Non Reactive    Comment: (NOTE) Performed At: [facility] [place], [place] [id number] Lindon Romp MD OX:[id number]   Vitamin B12     Status: None   Collection Time: 06/14/15  6:16 PM  Result Value Ref Range   Vitamin B-12 566 180 - 914 pg/mL    Comment: (NOTE) This assay is not validated for testing neonatal or myeloproliferative  syndrome specimens for Vitamin B12 levels.   Lipase, blood     Status: Abnormal   Collection Time: 06/14/15  6:16 PM  Result Value Ref Range   Lipase 195 (H) 11 - 51 U/L  Troponin I-serum (0, 3, 6 hours)     Status: Abnormal   Collection Time: 06/14/15  6:17 PM  Result Value Ref Range   Troponin I 0.04 (H) <0.031 ng/mL    Comment:        PERSISTENTLY INCREASED TROPONIN VALUES IN THE RANGE OF 0.04-0.49 ng/mL CAN BE SEEN IN:       -UNSTABLE ANGINA       -CONGESTIVE HEART FAILURE       -MYOCARDITIS       -CHEST TRAUMA       -ARRYHTHMIAS       -LATE PRESENTING MYOCARDIAL INFARCTION       -COPD   CLINICAL FOLLOW-UP RECOMMENDED.   Glucose, capillary     Status: Abnormal   Collection Time: 06/14/15  8:55 PM  Result Value Ref Range   Glucose-Capillary 152 (H) 65 - 99 mg/dL  Troponin I     Status: Abnormal   Collection Time: 06/14/15  9:30 PM  Result Value Ref Range   Troponin I 0.04 (H) <0.031 ng/mL    Comment:        PERSISTENTLY INCREASED TROPONIN VALUES IN THE RANGE OF 0.04-0.49 ng/mL CAN BE SEEN IN:       -UNSTABLE ANGINA       -CONGESTIVE HEART FAILURE       -MYOCARDITIS       -CHEST TRAUMA       -ARRYHTHMIAS       -LATE PRESENTING MYOCARDIAL INFARCTION       -COPD   CLINICAL FOLLOW-UP RECOMMENDED.   Phosphorus     Status: Abnormal   Collection Time: 06/14/15  9:30 PM  Result Value Ref Range   Phosphorus 6.1 (H) 2.5 - 4.6 mg/dL  CBC     Status: Abnormal   Collection Time: 06/15/15 12:17 AM  Result Value Ref Range   WBC 6.3 4.0 - 10.5 K/uL   RBC 3.72 (L) 4.22 - 5.81 MIL/uL    Hemoglobin 10.7 (L) 13.0 - 17.0 g/dL   HCT 91.6 (L) 39.5 - 43.0 %   MCV 89.2 78.0 - 100.0 fL   MCH 28.8 26.0 - 34.0 pg   MCHC 32.2 30.0 - 36.0 g/dL   RDW 99.2 99.2 - 74.1 %   Platelets 127 (L) 150 - 400 K/uL  Basic metabolic panel     Status: Abnormal   Collection Time: 06/15/15 12:17 AM  Result Value Ref Range   Sodium 130 (L) 135 - 145 mmol/L   Potassium 4.3 3.5 - 5.1 mmol/L   Chloride 95 (L) 101 - 111 mmol/L   CO2 24 22 - 32 mmol/L   Glucose, Bld 139 (H) 65 - 99 mg/dL   BUN 60 (H) 6 - 20 mg/dL   Creatinine, Ser 9.55 (H) 0.61 - 1.24 mg/dL   Calcium 7.8 (L) 8.9 - 10.3 mg/dL   GFR calc non Af Amer 5 (L) >60 mL/min   GFR calc Af Amer 6 (L) >60 mL/min    Comment: (NOTE) The eGFR has been calculated using the CKD EPI equation. This calculation has not been validated in all clinical situations. eGFR's persistently <60 mL/min signify possible Chronic Kidney Disease.    Anion gap 11 5 - 15  Troponin I     Status:  Abnormal   Collection Time: 06/15/15 12:17 AM  Result Value Ref Range   Troponin I 0.04 (H) <0.031 ng/mL    Comment:        PERSISTENTLY INCREASED TROPONIN VALUES IN THE RANGE OF 0.04-0.49 ng/mL CAN BE SEEN IN:       -UNSTABLE ANGINA       -CONGESTIVE HEART FAILURE       -MYOCARDITIS       -CHEST TRAUMA       -ARRYHTHMIAS       -LATE PRESENTING MYOCARDIAL INFARCTION       -COPD   CLINICAL FOLLOW-UP RECOMMENDED.   Glucose, capillary     Status: None   Collection Time: 06/15/15  6:04 AM  Result Value Ref Range   Glucose-Capillary 67 65 - 99 mg/dL  Glucose, capillary     Status: None   Collection Time: 06/15/15 11:18 AM  Result Value Ref Range   Glucose-Capillary 76 65 - 99 mg/dL    Ct Abdomen Pelvis Wo Contrast  06/15/2015  CLINICAL DATA:  Right upper quadrant abdominal pain.  Pancreatitis. EXAM: CT ABDOMEN AND PELVIS WITHOUT CONTRAST TECHNIQUE: Multidetector CT imaging of the abdomen and pelvis was performed following the standard protocol without IV contrast.  COMPARISON:  None. FINDINGS: Lower chest: The heart is enlarged. There are coronary artery calcifications. Pacemaker partially included. Minimal dependent atelectasis. Prominent size. No evidence of focal lesion allowing for lack contrast. Liver:  No focal lesion allowing for lack contrast.  Prominent size. Hepatobiliary: Partially distended. Intraluminal hyperdensity may be stones or sludge. No pericholecystic inflammation. Pancreas: No peripancreatic inflammation. No ductal dilatation. Pancreatic tail appears truncated. Spleen: Prominent size measuring 13.9 cm cranial caudal. Adrenal glands: No nodule. Kidneys: Atrophic right kidney with compensatory hypertrophy of the right kidney. No hydronephrosis. No urolithiasis. Stomach/Bowel: Stomach physiologically distended. Small hiatal hernia. There are no dilated or thickened small bowel loops. Moderate volume of stool throughout the colon without colonic wall thickening. Distal colonic diverticulosis without diverticulitis. The appendix is normal. Vascular/Lymphatic: No retroperitoneal adenopathy. Abdominal aorta is normal in caliber. Aortic atherosclerosis without aneurysm. Reproductive: Prominent sized prostate gland. Bladder: Minimally distended, minimal wall thickening likely related to nondistention. Other: No free air, free fluid, or intra-abdominal fluid collection. Musculoskeletal: There are no acute or suspicious osseous abnormalities. Facet arthropathy in the spine. IMPRESSION: 1. Contracted gallbladder with stones or sludge. No pericholecystic inflammation. 2. No CT findings of pancreatitis. 3. Markedly atrophic left kidney with compensatory hypertrophy of the right kidney. 4. Additional chronic findings as described. Electronically Signed   By: Jeb Levering M.D.   On: 06/15/2015 03:41   Dg Chest Port 1 View  06/14/2015  CLINICAL DATA:  Chest pain. Short of breath. Hypertension. CHF. Pneumonia. EXAM: PORTABLE CHEST 1 VIEW COMPARISON:  03/18/2015 CT  and 03/13/2015 plain film. FINDINGS: Numerous leads and wires project over the chest. Dialysis catheter with tips at cavoatrial junction and high right atrium. Midline trachea. Prior median sternotomy. Pacer/AICD device. Cardiomegaly accentuated by AP portable technique. No pleural effusion or pneumothorax. Low lung volumes with resultant pulmonary interstitial prominence. IMPRESSION: No acute cardiopulmonary disease. Cardiomegaly without congestive failure. Electronically Signed   By: Abigail Miyamoto M.D.   On: 06/14/2015 14:07    Review of Systems  Constitutional: Negative for fever and chills.  HENT: Negative for hearing loss.   Eyes: Negative for double vision and photophobia.  Respiratory: Positive for shortness of breath. Negative for cough and sputum production.   Cardiovascular: Positive for chest pain and  leg swelling. Negative for palpitations and claudication.  Gastrointestinal: Positive for nausea and abdominal pain.  Genitourinary: Positive for dysuria. Negative for urgency.  Neurological: Positive for dizziness. Negative for headaches.   Blood pressure 97/60, pulse 64, temperature 98 F (36.7 C), temperature source Oral, resp. rate 20, weight 83.4 kg (183 lb 13.8 oz), SpO2 99 %. Physical Exam  HENT:  Head: Normocephalic and atraumatic.  Eyes: Conjunctivae are normal. Pupils are equal, round, and reactive to light.  Neck: Normal range of motion. Neck supple. No tracheal deviation present. No thyromegaly present.  Cardiovascular: Normal rate and regular rhythm.   Murmur: soft systolic murmur noted.  No S3 gallop. Respiratory: Effort normal and breath sounds normal.  GI: Soft. Bowel sounds are normal. He exhibits distension. There is no rebound (mild epigastric tenderness noted) and no guarding.  Musculoskeletal:  No clubbing, cyanosis, 1+ edema    Assessment/Plan: Symptomatic cholelithiasis/mild acute pancreatitis Chest pain worrisome for angina.  MI ruled out. Coronary artery  disease status post CABG History of V. Fib cardiac arrest in the past, status post ICD. Hypertension. Diabetes mellitus. Hyperlipidemia. History of hepatitis C Cirrhosis of liver. End-stage renal disease on hemodialysis. Seizure disorder. Anemia of chronic disease. History of chronic hydrocephalus with dilated third and lateral ventricles. Plan Continue present management Will schedule for Lexiscan Myoview in a.m. Discussed with patient and family and agrees  Charolette Forward 06/15/2015, 2:01 PM

## 2015-06-15 NOTE — Progress Notes (Signed)
Family Medicine Teaching Service Daily Progress Note Intern Pager: [phone]  Patient name: Greydon Baquet Medical record number: [id number] Date of birth: [birth year] Age: 60 y.o. Gender: male  Primary Care Provider: [name], NP Consultants: None Code Status: Full  Pt Overview and Major Events to Date:   Assessment and Plan: Hensley Eberl is a 60 y.o. male presenting with chest pain/HTN . PMH is significant for HTN, HLD, Cardiac arrest, HFrEF, AICD, MI (2015), ESRD, Type 2 DM, CABG (1999), Hepatitis C (cirrhosis)  Chest Pain: Heart Score 4. Patient denies chest pain this AM. Reassuring labs. EKG no st   Patient with hx of CABG (1999), cardiac arrest. Risk factors include HTN, HLD, DM, hx of smoking. Blood pressure was elevated on arrival 173/97, however improved in the ED. Patient with no radiation of pain to the back. No hoariness of voice, or signs of horner syndrome. As patient's blood pressure is improving and no radiation of chest pain, would not consider this likely to be aortic dissection. Patient significant hx of cardiac issues, therefore will rule out for ACS. - Will get troponin 0.04 x3  - EKG pending   - Vitals per floor,  - IMDUR  - Coreg 6.25 mg x BID   Abdominal pain possibly 2/2 gallstone pancreatitis Patient with epigastric abdominal pain, however non-localized pain throughout on abdominal exam, worse in epigastric area. Patient has feeling of fullness and bloating, no vomiting. Consider ileus vs SBO vs Pancreatitis . Lipase returned 195, unclear if this may be as a result of ESRD / Dialysis. Though he is having epigastric pain. Additionally, RUQ pain. When asked where his pain is he points here. Imagine as below, additionally relates some of his symptoms to taking what sounds like his phoslo pills.  - Abdominal CT  with Contracted gallbladder with stones or sludge. No pericholecystic inflammation; No CT findings of pancreatitis. - Per surgery will consider  cholecystomy, needs cardiology r/o - NPO  - Fenatyl 12.5 mcg every 2 prn - received 3 doses   Diffuse Numbness: Neuro exam normal, normal strength throughout. Possibly due to dialysis vs. Diabetic neuropathy, however seems to be acute onset. Per patient has VP shunt.  RPR non-reactive  and B12 normal   HFrEF: EF 45-50%, moderate concentric hypertrophy, Systolic pressure was mildly to moderately increased. PA peak pressure: 47 mm Hg (S). Patient with SOB, no swelling in legs, no crackles of lung exam. BNP 75.2, CXR negative for pulmonary edema   HTN BP 140/81 - Continue home clonidine, hydralazine, Norvasc 10 mg   ESRD: M/W/F dialysis - consult nephrology.  - check phos, mg.   T2DM. Holding Glipizide   - CBGS ACQHS  - Sensitive sliding scale   FEN/GI:  Prophylaxis: heparin SBQ  Disposition: Telemetry   Subjective:  Patient is doing well. States he has abdominal pain, epigastric and URQ pain. Patient denies any chest pain.   Objective: Temp:  [97.6 F (36.4 C)-98.3 F (36.8 C)] 98.2 F (36.8 C) (05/23 0413) Pulse Rate:  [80-83] 80 (05/23 0413) Resp:  [16-22] 18 (05/23 0413) BP: (127-173)/(68-103) 140/81 mmHg (05/23 0413) SpO2:  [95 %-100 %] 100 % (05/23 0413) Weight:  [183 lb 13.8 oz (83.4 kg)] 183 lb 13.8 oz (83.4 kg) (05/23 0413) Physical Exam: General: Patient lying bed, NAD Cardiovascular: RRR, no murmurs  Respiratory: CTAB, no wheezing,  Abdomen: BS+, epigastric an RUQ tenderness, no distention  Extremities: moving all extremities   Laboratory:  Recent Labs Lab 06/14/15 1355 06/15/15 0017  WBC  6.0 6.3  HGB 12.0* 10.7*  HCT 35.8* 33.2*  PLT 130* 127*    Recent Labs Lab 06/14/15 1355 06/15/15 0017  NA 131* 130*  K 4.4 4.3  CL 96* 95*  CO2 22 24  BUN 56* 60*  CREATININE 9.05* 9.42*  CALCIUM 8.1* 7.8*  PROT 9.2*  --   BILITOT 0.6  --   ALKPHOS 66  --   ALT 18  --   AST 11*  --   GLUCOSE 129* 139*      Recent Labs Lab 06/14/15 1355  06/14/15 1817 06/14/15 2130 06/15/15 0017  TROPONINI 0.03 0.04* 0.04* 0.04*     Imaging/Diagnostic Tests: Ct Abdomen Pelvis Wo Contrast  06/15/2015  CLINICAL DATA:  Right upper quadrant abdominal pain.  Pancreatitis. EXAM: CT ABDOMEN AND PELVIS WITHOUT CONTRAST TECHNIQUE: Multidetector CT imaging of the abdomen and pelvis was performed following the standard protocol without IV contrast. COMPARISON:  None. FINDINGS: Lower chest: The heart is enlarged. There are coronary artery calcifications. Pacemaker partially included. Minimal dependent atelectasis. Prominent size. No evidence of focal lesion allowing for lack contrast. Liver:  No focal lesion allowing for lack contrast.  Prominent size. Hepatobiliary: Partially distended. Intraluminal hyperdensity may be stones or sludge. No pericholecystic inflammation. Pancreas: No peripancreatic inflammation. No ductal dilatation. Pancreatic tail appears truncated. Spleen: Prominent size measuring 13.9 cm cranial caudal. Adrenal glands: No nodule. Kidneys: Atrophic right kidney with compensatory hypertrophy of the right kidney. No hydronephrosis. No urolithiasis. Stomach/Bowel: Stomach physiologically distended. Small hiatal hernia. There are no dilated or thickened small bowel loops. Moderate volume of stool throughout the colon without colonic wall thickening. Distal colonic diverticulosis without diverticulitis. The appendix is normal. Vascular/Lymphatic: No retroperitoneal adenopathy. Abdominal aorta is normal in caliber. Aortic atherosclerosis without aneurysm. Reproductive: Prominent sized prostate gland. Bladder: Minimally distended, minimal wall thickening likely related to nondistention. Other: No free air, free fluid, or intra-abdominal fluid collection. Musculoskeletal: There are no acute or suspicious osseous abnormalities. Facet arthropathy in the spine. IMPRESSION: 1. Contracted gallbladder with stones or sludge. No pericholecystic inflammation. 2. No  CT findings of pancreatitis. 3. Markedly atrophic left kidney with compensatory hypertrophy of the right kidney. 4. Additional chronic findings as described. Electronically Signed   By: Jeb Levering M.D.   On: 06/15/2015 03:41   Dg Chest Port 1 View  06/14/2015  CLINICAL DATA:  Chest pain. Short of breath. Hypertension. CHF. Pneumonia. EXAM: PORTABLE CHEST 1 VIEW COMPARISON:  03/18/2015 CT and 03/13/2015 plain film. FINDINGS: Numerous leads and wires project over the chest. Dialysis catheter with tips at cavoatrial junction and high right atrium. Midline trachea. Prior median sternotomy. Pacer/AICD device. Cardiomegaly accentuated by AP portable technique. No pleural effusion or pneumothorax. Low lung volumes with resultant pulmonary interstitial prominence. IMPRESSION: No acute cardiopulmonary disease. Cardiomegaly without congestive failure. Electronically Signed   By: Abigail Miyamoto M.D.   On: 06/14/2015 14:07    Ajanee Buren Cletis Media, MD 06/15/2015, 7:17 AM PGY-1, [place] Intern pager: [phone], text pages welcome

## 2015-06-15 NOTE — Consult Note (Signed)
[facility] Renal Consultation Note    Indication for Consultation:  Management of ESRD/hemodialysis; anemia, hypertension/volume and secondary hyperparathyroidism PCP: Juluis Mire, NP  HPI: Eric [name] is a 60 y.o. 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  BP pre HD was 182/102 sitting and 158/84 (somewhat higher than usual) Pulse 97 and temp 97.4 . Pre HD wt was 83 kg (lower than usual).  He said he felt like someone was sitting on his chest and his legs were weak. EMS was called. BP ^ >200 and pt was in SVT when EMS connected the monitor. He only received 27 minutes of dialysis  Evaluation in the ED showed trop 0.03 which have been flat WBC wnl Labs today: hgb 10.7 down from prior hgb of 12 K 4.3. Initial CXR neg infiltrate or volume.  CT abdomen showes contracted GB stones or sludge. Today his still has some mid abdominal discomfort, but no N, V, D, or constipation.  He did report some epigastric pain and bloaing over the past few weeks and also had it last night as well.  It still is present to some degree, but not as badly.  Still makes a small amount of urine. He denies SOB.  Past Medical History  Diagnosis Date  . Hypertension   . Hyperlipemia   . Edema   . Cardiac arrest due to underlying cardiac condition ([place])   . Obesity   . Ventricular tachycardia ([place])   . AICD (automatic cardioverter/defibrillator) present   . CHF (congestive heart failure) ([place])   . Myocardial infarction ([place]) 03/2013  . Pneumonia 2016  . History of blood transfusion 1950's    "related to pinal menigitis; HAD 16 OPERATIONS TOTAL"  . Scabies infestation 06/29/2014  . Shortness of breath dyspnea   . Type II diabetes mellitus ([place])     type 2  . CKD (chronic kidney  disease), stage III    Past Surgical History  Procedure Laterality Date  . Implantable cardioverter defibrillator implant  03/24/2013    STJ single chamber ICD implanted by Dr Caryl Comes for secondary prevention  . Left heart catheterization with coronary/graft angiogram  03/07/2013    Procedure: LEFT HEART CATHETERIZATION WITH [name];  Surgeon: Clent Demark, MD;  Location: [facility] CATH LAB;  Service: Cardiovascular;;  . Implantable cardioverter defibrillator implant N/A 03/24/2013    Procedure: IMPLANTABLE CARDIOVERTER DEFIBRILLATOR IMPLANT;  Surgeon: Deboraha Sprang, MD;  Location: [facility] CATH LAB;  Service: Cardiovascular;  Laterality: N/A;  . Cardiac catheterization    . Head surgery  1950'S    "for spinal menigitis; HAD 16 OPERATIONS TOTAL"  . Back surgery  1950's    "for spinal menigitis; HAD 16 OPERATIONS TOTAL"  . 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 Coronary artery bypass graft  1999    cabg x4  . Insertion of dialysis catheter N/A 03/13/2015    Procedure: INSERTION OF TUNNELED DIALYSIS CATHETER;  Surgeon: Conrad Orient, MD;  Location: [place];  Service: Vascular;  Laterality: N/A;  . Av fistula placement Right 03/18/2015    Procedure: BRACHIOCEPHALIC ARTERIOVENOUS (AV) FISTULA CREATION;  Surgeon: Angelia Mould, MD;  Location: [place];  Service: Vascular;  Laterality: Right;  Family History  Problem Relation Age of Onset  . Heart disease Mother   . Diabetes Mother    Social History:  reports that he has quit smoking. His smoking use included Cigarettes. He has a 5 pack-year smoking history. He has never used smokeless tobacco. He reports that he does not drink alcohol or use illicit drugs. No Known Allergies Prior to Admission medications   Medication Sig Start Date End Date Taking? Authorizing Provider  amLODipine (NORVASC) 10 MG tablet Take 1 tablet (10 mg total) by mouth daily. 08/08/14  Yes Theodis Blaze, MD  aspirin EC 81 MG  tablet Take 81 mg by mouth 2 (two) times daily as needed (pain).    Yes Historical Provider, MD  calcium acetate (PHOSLO) 667 MG capsule Take 2 capsules (1,334 mg total) by mouth 3 (three) times daily with meals. 03/20/15  Yes Maryann Mikhail, DO  calcium carbonate (TUMS EX) 750 MG chewable tablet Chew 2 tablets by mouth daily as needed for heartburn (stomach pain).   Yes Historical Provider, MD  cinacalcet (SENSIPAR) 30 MG tablet Take 30 mg by mouth daily with supper.   Yes Historical Provider, MD  glipiZIDE (GLUCOTROL) 10 MG tablet Take 10 mg by mouth daily. 02/15/15  Yes Historical Provider, MD  isosorbide mononitrate (IMDUR) 60 MG 24 hr tablet Take 60 mg by mouth every morning. 02/15/15  Yes Historical Provider, MD  multivitamin (RENA-VIT) TABS tablet Take 1 tablet by mouth at bedtime. 03/20/15  Yes Maryann Mikhail, DO  benzonatate (TESSALON) 100 MG capsule Take 1 capsule (100 mg total) by mouth 3 (three) times daily as needed for cough. Patient not taking: Reported on 06/14/2015 03/20/15   Cristal Ford, DO  calcitRIOL (ROCALTROL) 0.25 MCG capsule Take 1 capsule (0.25 mcg total) by mouth daily. 03/20/15   Maryann Mikhail, DO  carvedilol (COREG) 6.25 MG tablet Take 1 tablet (6.25 mg total) by mouth 2 (two) times daily with a meal. Patient not taking: Reported on 03/10/2015 08/08/14   Theodis Blaze, MD  cloNIDine (CATAPRES) 0.1 MG tablet Take 1 tablet (0.1 mg total) by mouth 3 (three) times daily. Patient not taking: Reported on 06/14/2015 08/08/14   Theodis Blaze, MD  hydrALAZINE (APRESOLINE) 25 MG tablet Take 1 tablet (25 mg total) by mouth every 8 (eight) hours. Patient not taking: Reported on 06/14/2015 08/08/14   Theodis Blaze, MD  traMADol (ULTRAM) 50 MG tablet Take 1 tablet (50 mg total) by mouth every 12 (twelve) hours as needed for moderate pain (please give one tab prior to HD). Patient not taking: Reported on 06/14/2015 03/20/15   Cristal Ford, DO   Current Facility-Administered Medications   Medication Dose Route Frequency Provider Last Rate Last Dose  . acetaminophen (TYLENOL) tablet 650 mg  650 mg Oral Q4H PRN Asiyah Cletis Media, MD      . amLODipine (NORVASC) tablet 10 mg  10 mg Oral Daily Asiyah Cletis Media, MD      . calcitRIOL (ROCALTROL) capsule 0.25 mcg  0.25 mcg Oral Daily Asiyah Cletis Media, MD   0.25 mcg at 06/15/15 1000  . carvedilol (COREG) tablet 6.25 mg  6.25 mg Oral BID WC Asiyah Cletis Media, MD   6.25 mg at 06/15/15 0800  . cloNIDine (CATAPRES) tablet 0.1 mg  0.1 mg Oral TID Asiyah Cletis Media, MD   0.1 mg at 06/14/15 2338  . fentaNYL (SUBLIMAZE) injection 12.5 mcg  12.5 mcg Intravenous Q2H PRN Aquilla Hacker, MD   12.5 mcg at  06/15/15 0816  . gi cocktail (Maalox,Lidocaine,Donnatal)  30 mL Oral QID PRN Asiyah Cletis Media, MD      . gi cocktail (Maalox,Lidocaine,Donnatal)  30 mL Oral Once Aquilla Hacker, MD   30 mL at 06/15/15 0126  . heparin injection 5,000 Units  5,000 Units Subcutaneous Q8H Asiyah Cletis Media, MD   5,000 Units at 06/14/15 2341  . hydrALAZINE (APRESOLINE) tablet 25 mg  25 mg Oral Q8H Asiyah Cletis Media, MD   25 mg at 06/14/15 2338  . insulin aspart (novoLOG) injection 0-9 Units  0-9 Units Subcutaneous TID WC Asiyah Cletis Media, MD   0 Units at 06/15/15 0700  . isosorbide mononitrate (IMDUR) 24 hr tablet 60 mg  60 mg Oral Daily Asiyah Cletis Media, MD   60 mg at 06/15/15 1000  . ondansetron (ZOFRAN) injection 4 mg  4 mg Intravenous Q6H PRN Asiyah Cletis Media, MD       Labs: Basic Metabolic Panel:  Recent Labs Lab 06/14/15 1355 06/14/15 2130 06/15/15 0017  NA 131*  --  130*  K 4.4  --  4.3  CL 96*  --  95*  CO2 22  --  24  GLUCOSE 129*  --  139*  BUN 56*  --  60*  CREATININE 9.05*  --  9.42*  CALCIUM 8.1*  --  7.8*  PHOS  --  6.1*  --    Liver Function Tests:  Recent Labs Lab 06/14/15 1355  AST 11*  ALT 18  ALKPHOS 66  BILITOT 0.6  PROT 9.2*  ALBUMIN 3.2*    Recent Labs Lab 06/14/15 1816  LIPASE 195*    CBC:  Recent Labs Lab 06/14/15 1355 06/15/15 0017  WBC 6.0 6.3  HGB 12.0* 10.7*  HCT 35.8* 33.2*  MCV 87.3 89.2  PLT 130* 127*   Cardiac Enzymes:  Recent Labs Lab 06/14/15 1355 06/14/15 1817 06/14/15 2130 06/15/15 0017  TROPONINI 0.03 0.04* 0.04* 0.04*   CBG:  Recent Labs Lab 06/14/15 2055 06/15/15 0604  GLUCAP 152* 67  Studies/Results: Ct Abdomen Pelvis Wo Contrast  06/15/2015  CLINICAL DATA:  Right upper quadrant abdominal pain.  Pancreatitis. EXAM: CT ABDOMEN AND PELVIS WITHOUT CONTRAST TECHNIQUE: Multidetector CT imaging of the abdomen and pelvis was performed following the standard protocol without IV contrast. COMPARISON:  None. FINDINGS: Lower chest: The heart is enlarged. There are coronary artery calcifications. Pacemaker partially included. Minimal dependent atelectasis. Prominent size. No evidence of focal lesion allowing for lack contrast. Liver:  No focal lesion allowing for lack contrast.  Prominent size. Hepatobiliary: Partially distended. Intraluminal hyperdensity may be stones or sludge. No pericholecystic inflammation. Pancreas: No peripancreatic inflammation. No ductal dilatation. Pancreatic tail appears truncated. Spleen: Prominent size measuring 13.9 cm cranial caudal. Adrenal glands: No nodule. Kidneys: Atrophic right kidney with compensatory hypertrophy of the right kidney. No hydronephrosis. No urolithiasis. Stomach/Bowel: Stomach physiologically distended. Small hiatal hernia. There are no dilated or thickened small bowel loops. Moderate volume of stool throughout the colon without colonic wall thickening. Distal colonic diverticulosis without diverticulitis. The appendix is normal. Vascular/Lymphatic: No retroperitoneal adenopathy. Abdominal aorta is normal in caliber. Aortic atherosclerosis without aneurysm. Reproductive: Prominent sized prostate gland. Bladder: Minimally distended, minimal wall thickening likely related to nondistention. Other: No free  air, free fluid, or intra-abdominal fluid collection. Musculoskeletal: There are no acute or suspicious osseous abnormalities. Facet arthropathy in the spine. IMPRESSION: 1. Contracted gallbladder with stones or sludge. No pericholecystic inflammation. 2. No CT findings of pancreatitis. 3. Markedly atrophic left  kidney with compensatory hypertrophy of the right kidney. 4. Additional chronic findings as described. Electronically Signed   By: Jeb Levering M.D.   On: 06/15/2015 03:41   Dg Chest Port 1 View  06/14/2015  CLINICAL DATA:  Chest pain. Short of breath. Hypertension. CHF. Pneumonia. EXAM: PORTABLE CHEST 1 VIEW COMPARISON:  03/18/2015 CT and 03/13/2015 plain film. FINDINGS: Numerous leads and wires project over the chest. Dialysis catheter with tips at cavoatrial junction and high right atrium. Midline trachea. Prior median sternotomy. Pacer/AICD device. Cardiomegaly accentuated by AP portable technique. No pleural effusion or pneumothorax. Low lung volumes with resultant pulmonary interstitial prominence. IMPRESSION: No acute cardiopulmonary disease. Cardiomegaly without congestive failure. Electronically Signed   By: Abigail Miyamoto M.D.   On: 06/14/2015 14:07    ROS: As per HPI otherwise negative.  Physical Exam: Filed Vitals:   06/14/15 1630 06/14/15 2102 06/14/15 2329 06/15/15 0413  BP: 165/95 132/77 127/68 140/81  Pulse: 83 80 83 80  Temp:  98.3 F (36.8 C)  98.2 F (36.8 C)  TempSrc:  Oral  Oral  Resp: 20 18  18   Weight:    83.4 kg (183 lb 13.8 oz)  SpO2: 100% 97% 95% 100%     General: Well developed, well nourished, in no acute distress.on room air Wife and daughter with him at bedside Head: Normocephalic, atraumatic, sclera non-icteric, mucus membranes are moist Neck: Supple. JVD not elevated. Lungs: Clear bilaterally to auscultation without wheezes, rales, or rhonchi. Breathing is unlabored. Heart: RRR with S1 S2. No rub Abdomen: Soft,  Mildly tender mid epigastrum  non-distended with normoactive bowel sounds. No rebound/guarding. M-S:  Strength and tone appear normal for age. Lower extremities:without sig edema or ischemic changes, no open wounds,, scabs on RLE, multiple scars Neuro: Alert and oriented X 3. Moves all extremities spontaneously. Psych:  Responds to questions appropriately with a normal affect. Dialysis Access: right upper AVF + bruit  Maturing- placed 2/23 but says we cannot use until he sees surgery again  and right IJ [facility]  Dialysis Orders: [facility] MWF 4 hr 15 min 180 400/800 2 K 2.25 Ca EDW 81.5 right upper AVF maturing and right IJ heparin 7000 calcitriol 1 no ESA or Fe Recent labs: Hgb 12 5/17 44% sat iPTH 802 - prev on sensipar only - just added phoslo 2 ac as P up on May labs  Assessment/Plan: 1.  symptomatic cholelithiasis - surgery has seen - needs cards pre op clearance given past cardiac hx 2.  ESRD -  MWF - only had had 30 min yesterday - plan completion of HD today off schedule- Na low - needs volume down some- re-evaluate EDW 3.  Hypertension/volume  - CXR neg for volume - pt has ONLY been taking norvasc. Clonidine and hydralazine were stopped 4/5 by Dr. Jimmy Footman - his wife brought pill bottles to confirm he has only been on norvasc - volume can come down some but unfortunately has already received BP meds today. Plan continue coreg. D/c hydralazine and change clonidine to prn 4.  Anemia  - hgb down some this am - follow - may need to resume ESA 5.  Metabolic bone disease -  Continue binders/sensipar/calcitriol ^ to 1.0- HD off schedule- notify RN to give today 6.  Nutrition - on CL -added vits 7. Hx CABG s/p ICD  Myriam Jacobson, Hershal Coria [place] [phone] 06/15/2015, 10:53 AM   Patient seen and examined, agree with above note with above modifications. Known to [place]-  had CP/abd pain that worsened with HD yest- says is still there but stable- found to have cholelithiasis thought to be etiology of his sxms-  surgery on board.  To finish out his HD today off schedule then getting him back on schedule will depend on other events of hosp Corliss Parish, MD 06/15/2015

## 2015-06-16 ENCOUNTER — Inpatient Hospital Stay (HOSPITAL_COMMUNITY): Payer: Medicaid Other

## 2015-06-16 DIAGNOSIS — K8020 Calculus of gallbladder without cholecystitis without obstruction: Secondary | ICD-10-CM | POA: Insufficient documentation

## 2015-06-16 LAB — RENAL FUNCTION PANEL
ALBUMIN: 2.9 g/dL — AB (ref 3.5–5.0)
Anion gap: 9 (ref 5–15)
BUN: 27 mg/dL — ABNORMAL HIGH (ref 6–20)
CALCIUM: 8.3 mg/dL — AB (ref 8.9–10.3)
CO2: 26 mmol/L (ref 22–32)
CREATININE: 5.65 mg/dL — AB (ref 0.61–1.24)
Chloride: 88 mmol/L — ABNORMAL LOW (ref 101–111)
GFR, EST AFRICAN AMERICAN: 11 mL/min — AB (ref >60)
GFR, EST NON AFRICAN AMERICAN: 10 mL/min — AB (ref >60)
Glucose, Bld: 127 mg/dL — ABNORMAL HIGH (ref 65–99)
PHOSPHORUS: 4.6 mg/dL (ref 2.5–4.6)
Potassium: 4.9 mmol/L (ref 3.5–5.1)
SODIUM: 123 mmol/L — AB (ref 135–145)

## 2015-06-16 LAB — GLUCOSE, CAPILLARY
GLUCOSE-CAPILLARY: 58 mg/dL — AB (ref 65–99)
Glucose-Capillary: 96 mg/dL (ref 65–99)
Glucose-Capillary: 193 mg/dL — ABNORMAL HIGH (ref 65–99)
Glucose-Capillary: 100 mg/dL — ABNORMAL HIGH (ref 65–99)

## 2015-06-16 MED ORDER — REGADENOSON 0.4 MG/5ML IV SOLN
0.4000 mg | Freq: Once | INTRAVENOUS | Status: AC
  Administered 2015-06-16: 0.4 mg via INTRAVENOUS
  Filled 2015-06-16: qty 5

## 2015-06-16 MED ORDER — TECHNETIUM TC 99M TETROFOSMIN IV KIT
10.0000 | PACK | Freq: Once | INTRAVENOUS | Status: AC | PRN
  Administered 2015-06-16: 10 via INTRAVENOUS

## 2015-06-16 MED ORDER — REGADENOSON 0.4 MG/5ML IV SOLN
INTRAVENOUS | Status: AC
  Filled 2015-06-16: qty 5

## 2015-06-16 MED ORDER — NITROGLYCERIN 0.4 MG SL SUBL
SUBLINGUAL_TABLET | SUBLINGUAL | Status: AC
  Administered 2015-06-16: 21:00:00
  Filled 2015-06-16: qty 1

## 2015-06-16 MED ORDER — TECHNETIUM TC 99M TETROFOSMIN IV KIT
30.0000 | PACK | Freq: Once | INTRAVENOUS | Status: AC | PRN
  Administered 2015-06-16: 30 via INTRAVENOUS

## 2015-06-16 NOTE — Progress Notes (Signed)
FPTS Interim Progress Note  S:Was called to evaluate patient, per nursing patient earlier with pain and numbness in left lower extremity. Patient was apparently unresponsive at this time.  At patient's bedside, I asked him what happened and he stated that he was having pain throughout his upper and lower extremities. He states he had just taken his phoslo and this had caused him similar episodes at home after taking this medication. He also indicating have numbness in his feet bilaterally in his feet. However this was chronic per him.    O: BP 122/68 mmHg  Pulse 69  Temp(Src) 97.7 F (36.5 C) (Oral)  Resp 18  Wt 185 lb 13.6 oz (84.3 kg)  SpO2 98%  Neurologic exam : Cn 2-7 intact Strength equal & normal in upper & lower extremities, patient able to walk Numbness in bilateral feet.   A/P: Neurologically intact, no focal weakness or numbness. Muscle cramp vs neuropathic pain due diabetes is likely cause of the muscle pain. Patient believes it is due to phoslo, indicated patient should discuss with nephrology    Asiyah Cletis Media, MD 06/16/2015, 7:29 PM PGY-1, [place] [facility] pager [phone]

## 2015-06-16 NOTE — Progress Notes (Signed)
Subjective: Feeling better  Objective: Vital signs in last 24 hours: Temp:  [96.9 F (36.1 C)-98.1 F (36.7 C)] 98.1 F (36.7 C) (05/24 0605) Pulse Rate:  [67-79] 76 (05/24 1106) Resp:  [12-16] 16 (05/23 1910) BP: (69-166)/(48-94) 107/79 mmHg (05/24 1106) SpO2:  [100 %] 100 % (05/24 0605) Weight:  [83.3 kg (183 lb 10.3 oz)-84.3 kg (185 lb 13.6 oz)] 84.3 kg (185 lb 13.6 oz) (05/24 0502) Last BM Date: 06/15/15  Intake/Output from previous day: 05/23 0701 - 05/24 0700 In: 440 [P.O.:440] Out: 178  Intake/Output this shift:    Abdomen minimally tender  Lab Results:   Recent Labs  06/14/15 1355 06/15/15 0017  WBC 6.0 6.3  HGB 12.0* 10.7*  HCT 35.8* 33.2*  PLT 130* 127*   BMET  Recent Labs  06/14/15 1355 06/15/15 0017  NA 131* 130*  K 4.4 4.3  CL 96* 95*  CO2 22 24  GLUCOSE 129* 139*  BUN 56* 60*  CREATININE 9.05* 9.42*  CALCIUM 8.1* 7.8*   PT/INR  Recent Labs  06/14/15 1355  LABPROT 18.9*  INR 1.58*   ABG No results for input(s): PHART, HCO3 in the last 72 hours.  Invalid input(s): PCO2, PO2  Studies/Results: Ct Abdomen Pelvis Wo Contrast  06/15/2015  CLINICAL DATA:  Right upper quadrant abdominal pain.  Pancreatitis. EXAM: CT ABDOMEN AND PELVIS WITHOUT CONTRAST TECHNIQUE: Multidetector CT imaging of the abdomen and pelvis was performed following the standard protocol without IV contrast. COMPARISON:  None. FINDINGS: Lower chest: The heart is enlarged. There are coronary artery calcifications. Pacemaker partially included. Minimal dependent atelectasis. Prominent size. No evidence of focal lesion allowing for lack contrast. Liver:  No focal lesion allowing for lack contrast.  Prominent size. Hepatobiliary: Partially distended. Intraluminal hyperdensity may be stones or sludge. No pericholecystic inflammation. Pancreas: No peripancreatic inflammation. No ductal dilatation. Pancreatic tail appears truncated. Spleen: Prominent size measuring 13.9 cm  cranial caudal. Adrenal glands: No nodule. Kidneys: Atrophic right kidney with compensatory hypertrophy of the right kidney. No hydronephrosis. No urolithiasis. Stomach/Bowel: Stomach physiologically distended. Small hiatal hernia. There are no dilated or thickened small bowel loops. Moderate volume of stool throughout the colon without colonic wall thickening. Distal colonic diverticulosis without diverticulitis. The appendix is normal. Vascular/Lymphatic: No retroperitoneal adenopathy. Abdominal aorta is normal in caliber. Aortic atherosclerosis without aneurysm. Reproductive: Prominent sized prostate gland. Bladder: Minimally distended, minimal wall thickening likely related to nondistention. Other: No free air, free fluid, or intra-abdominal fluid collection. Musculoskeletal: There are no acute or suspicious osseous abnormalities. Facet arthropathy in the spine. IMPRESSION: 1. Contracted gallbladder with stones or sludge. No pericholecystic inflammation. 2. No CT findings of pancreatitis. 3. Markedly atrophic left kidney with compensatory hypertrophy of the right kidney. 4. Additional chronic findings as described. Electronically Signed   By: Jeb Levering M.D.   On: 06/15/2015 03:41   Nm Myocar Multi W/spect W/wall Motion / Ef  06/16/2015   Defect 1: There is a medium defect of mild severity present in the basal inferior and mid inferior location.  Findings consistent with prior myocardial infarction.  The left ventricular ejection fraction is mildly decreased (45-54%).  Nuclear stress EF: 51%.    Dg Chest Port 1 View  06/14/2015  CLINICAL DATA:  Chest pain. Short of breath. Hypertension. CHF. Pneumonia. EXAM: PORTABLE CHEST 1 VIEW COMPARISON:  03/18/2015 CT and 03/13/2015 plain film. FINDINGS: Numerous leads and wires project over the chest. Dialysis catheter with tips at cavoatrial junction and high right atrium. Midline trachea. Prior  median sternotomy. Pacer/AICD device. Cardiomegaly  accentuated by AP portable technique. No pleural effusion or pneumothorax. Low lung volumes with resultant pulmonary interstitial prominence. IMPRESSION: No acute cardiopulmonary disease. Cardiomegaly without congestive failure. Electronically Signed   By: Abigail Miyamoto M.D.   On: 06/14/2015 14:07    Anti-infectives: Anti-infectives    None      Assessment/Plan: s/p * No surgery found *  Symptomatic cholelithiasis  Cards workup ongoing.  If ok for general anesthesia, then plan lap chole tomorrow  LOS: 1 day    Vonnie Spagnolo A 06/16/2015

## 2015-06-16 NOTE — Progress Notes (Signed)
Called in by pt family to evaluate pt c/o pain.  Upon arrival, Pt was rigid, diaphoretic, having difficulty speaking and appeared to be having difficulty breathing. He gestured and nodded that he was having pain/numbness that started in his L chest/shoulder and spread throughout the entire left side of his body.  He could not move his legs or arms, was lifted and transferred from chair to the bed with full assistance by RN staff.   VS assessed, 2L O2 applied via [place], EKG obtained and nitroglycerine administered, rapid response and MD paged.  Once rapid response arrived, the episode was beginning to resolve.  Still c/o severe stiffness and pressure in his legs and arms, neurologically intact.  MD arrived and pt was fully able to sit, stand and walk with assistance, though feet were numb.  He stated that he has had similar episodes before and he relates it to having taken Phoslo.  No new orders received, MD advised pt to talk to nephrologist about reaction to Phoslo.

## 2015-06-16 NOTE — Progress Notes (Signed)
Pt ambulated in hall 53ft with walker.

## 2015-06-16 NOTE — Progress Notes (Signed)
Subjective:  Doing well.  Denies any chest pain or shortness of breath.  States abdominal pain is also almost resolved.  No fever or chills. Patient underwent nuclear stress test today which showed basal inferior and mid inferior scarring with no evidence of reversible ischemia, EF of 51%. Objective:  Vital Signs in the last 24 hours: Temp:  [96.9 F (36.1 C)-98.1 F (36.7 C)] 98.1 F (36.7 C) (05/24 0605) Pulse Rate:  [67-79] 76 (05/24 1106) Resp:  [12-16] 16 (05/23 1910) BP: (69-166)/(48-94) 107/79 mmHg (05/24 1106) SpO2:  [100 %] 100 % (05/24 0605) Weight:  [83.3 kg (183 lb 10.3 oz)-84.3 kg (185 lb 13.6 oz)] 84.3 kg (185 lb 13.6 oz) (05/24 0502)  Intake/Output from previous day: 05/23 0701 - 05/24 0700 In: 440 [P.O.:440] Out: 178  Intake/Output from this shift:    Physical Exam: exam unchanged  Lab Results:  Recent Labs  06/14/15 1355 06/15/15 0017  WBC 6.0 6.3  HGB 12.0* 10.7*  PLT 130* 127*    Recent Labs  06/14/15 1355 06/15/15 0017  NA 131* 130*  K 4.4 4.3  CL 96* 95*  CO2 22 24  GLUCOSE 129* 139*  BUN 56* 60*  CREATININE 9.05* 9.42*    Recent Labs  06/14/15 2130 06/15/15 0017  TROPONINI 0.04* 0.04*   Hepatic Function Panel  Recent Labs  06/14/15 1355  PROT 9.2*  ALBUMIN 3.2*  AST 11*  ALT 18  ALKPHOS 66  BILITOT 0.6   No results for input(s): CHOL in the last 72 hours. No results for input(s): PROTIME in the last 72 hours.  Imaging: Imaging results have been reviewed and Ct Abdomen Pelvis Wo Contrast  06/15/2015  CLINICAL DATA:  Right upper quadrant abdominal pain.  Pancreatitis. EXAM: CT ABDOMEN AND PELVIS WITHOUT CONTRAST TECHNIQUE: Multidetector CT imaging of the abdomen and pelvis was performed following the standard protocol without IV contrast. COMPARISON:  None. FINDINGS: Lower chest: The heart is enlarged. There are coronary artery calcifications. Pacemaker partially included. Minimal dependent atelectasis. Prominent size. No  evidence of focal lesion allowing for lack contrast. Liver:  No focal lesion allowing for lack contrast.  Prominent size. Hepatobiliary: Partially distended. Intraluminal hyperdensity may be stones or sludge. No pericholecystic inflammation. Pancreas: No peripancreatic inflammation. No ductal dilatation. Pancreatic tail appears truncated. Spleen: Prominent size measuring 13.9 cm cranial caudal. Adrenal glands: No nodule. Kidneys: Atrophic right kidney with compensatory hypertrophy of the right kidney. No hydronephrosis. No urolithiasis. Stomach/Bowel: Stomach physiologically distended. Small hiatal hernia. There are no dilated or thickened small bowel loops. Moderate volume of stool throughout the colon without colonic wall thickening. Distal colonic diverticulosis without diverticulitis. The appendix is normal. Vascular/Lymphatic: No retroperitoneal adenopathy. Abdominal aorta is normal in caliber. Aortic atherosclerosis without aneurysm. Reproductive: Prominent sized prostate gland. Bladder: Minimally distended, minimal wall thickening likely related to nondistention. Other: No free air, free fluid, or intra-abdominal fluid collection. Musculoskeletal: There are no acute or suspicious osseous abnormalities. Facet arthropathy in the spine. IMPRESSION: 1. Contracted gallbladder with stones or sludge. No pericholecystic inflammation. 2. No CT findings of pancreatitis. 3. Markedly atrophic left kidney with compensatory hypertrophy of the right kidney. 4. Additional chronic findings as described. Electronically Signed   By: Jeb Levering M.D.   On: 06/15/2015 03:41   Nm Myocar Multi W/spect W/wall Motion / Ef  06/16/2015   Defect 1: There is a medium defect of mild severity present in the basal inferior and mid inferior location.  Findings consistent with prior myocardial infarction.  The left ventricular ejection fraction is mildly decreased (45-54%).  Nuclear stress EF: 51%.     Cardiac  Studies:  Assessment/Plan:  Symptomatic cholelithiasis/mild acute pancreatitis Stable angina.  Negative nuclear stress test Coronary artery disease status post CABG History of V. Fib cardiac arrest in the past, status post ICD. Hypertension. Diabetes mellitus. Hyperlipidemia. History of hepatitis C Cirrhosis of liver. End-stage renal disease on hemodialysis. Seizure disorder. Anemia of chronic disease. History of chronic hydrocephalus with dilated third and lateral ventricles. Plan Continue present management. Acceptable risk for laparoscopic cholecystectomy  from cardiac point of view.  LOS: 1 day    Charolette Forward 06/16/2015, 2:16 PM

## 2015-06-16 NOTE — Progress Notes (Signed)
Subjective:  Had HD yest - only removed 200 cc's due to low BP at start- is 150's today ?  Cards saw and wanted stress test in preparation for cholecystectomy- planned for today "im not doing HD today"   Objective Vital signs in last 24 hours: Filed Vitals:   06/15/15 1910 06/15/15 2038 06/16/15 0502 06/16/15 0605  BP: 119/79 166/85  158/87  Pulse: 76 79  71  Temp: 96.9 F (36.1 C) 97.4 F (36.3 C)  98.1 F (36.7 C)  TempSrc: Oral Oral  Oral  Resp: 16     Weight: 83.3 kg (183 lb 10.3 oz)  84.3 kg (185 lb 13.6 oz)   SpO2:  100%  100%   Weight change: 0.1 kg (3.5 oz)  Intake/Output Summary (Last 24 hours) at 06/16/15 0809 Last data filed at 06/15/15 1910  Gross per 24 hour  Intake    440 ml  Output    178 ml  Net    262 ml    Dialysis Orders: [facility] MWF 4 hr 15 min 180 400/800 2 K 2.25 Ca EDW 81.5 right upper AVF maturing   - placed 2/23 and right IJ heparin 7000 calcitriol 1 no ESA or Fe Recent labs: Hgb 12 5/17 44% sat iPTH 802 - prev on sensipar only - just added phoslo 2 ac as P up on May labs  Assessment/Plan: 1. symptomatic cholelithiasis - surgery has seen - needs cards pre op clearance given past cardiac hx 2. ESRD - normally MWF via PC even though AVF seems ready (placed 2/23) - only had had 30 min Monday as OP -had full treatment Tuesday off schedule- would do Thurs and Sat then Mon to get back on schedule 3. Hypertension/volume - CXR neg for volume - pt has ONLY been taking norvasc. Clonidine and hydralazine were stopped 4/5 by Dr. Jimmy Footman -volume can come down some but unfortunately received BP meds yest pre tx- is this why BP was so low? Plan continue coreg. D/c hydralazine and change clonidine to prn- challenge with next HD tomorrow 4. Anemia - hgb down some this am - follow - may need to resume ESA- will follow 5. Metabolic bone disease - Continue binders(phoslo)/sensipar/calcitriol ^ to 1.0- HD off schedule- notify RN to give today 6. Nutrition - on CL -added  vits 7. Hx CABG s/p ICD    Eric [name] A    Labs: Basic Metabolic Panel:  Recent Labs Lab 06/14/15 1355 06/14/15 2130 06/15/15 0017  NA 131*  --  130*  K 4.4  --  4.3  CL 96*  --  95*  CO2 22  --  24  GLUCOSE 129*  --  139*  BUN 56*  --  60*  CREATININE 9.05*  --  9.42*  CALCIUM 8.1*  --  7.8*  PHOS  --  6.1*  --    Liver Function Tests:  Recent Labs Lab 06/14/15 1355  AST 11*  ALT 18  ALKPHOS 66  BILITOT 0.6  PROT 9.2*  ALBUMIN 3.2*    Recent Labs Lab 06/14/15 1816  LIPASE 195*   No results for input(s): AMMONIA in the last 168 hours. CBC:  Recent Labs Lab 06/14/15 1355 06/15/15 0017  WBC 6.0 6.3  HGB 12.0* 10.7*  HCT 35.8* 33.2*  MCV 87.3 89.2  PLT 130* 127*   Cardiac Enzymes:  Recent Labs Lab 06/14/15 1355 06/14/15 1817 06/14/15 2130 06/15/15 0017  TROPONINI 0.03 0.04* 0.04* 0.04*   CBG:  Recent Labs Lab 06/14/15  2055 06/15/15 0604 06/15/15 1118 06/15/15 2126 06/16/15 0638  GLUCAP 152* 67 76 114* 96    Iron Studies: No results for input(s): IRON, TIBC, TRANSFERRIN, FERRITIN in the last 72 hours. Studies/Results: Ct Abdomen Pelvis Wo Contrast  06/15/2015  CLINICAL DATA:  Right upper quadrant abdominal pain.  Pancreatitis. EXAM: CT ABDOMEN AND PELVIS WITHOUT CONTRAST TECHNIQUE: Multidetector CT imaging of the abdomen and pelvis was performed following the standard protocol without IV contrast. COMPARISON:  None. FINDINGS: Lower chest: The heart is enlarged. There are coronary artery calcifications. Pacemaker partially included. Minimal dependent atelectasis. Prominent size. No evidence of focal lesion allowing for lack contrast. Liver:  No focal lesion allowing for lack contrast.  Prominent size. Hepatobiliary: Partially distended. Intraluminal hyperdensity may be stones or sludge. No pericholecystic inflammation. Pancreas: No peripancreatic inflammation. No ductal dilatation. Pancreatic tail appears truncated. Spleen:  Prominent size measuring 13.9 cm cranial caudal. Adrenal glands: No nodule. Kidneys: Atrophic right kidney with compensatory hypertrophy of the right kidney. No hydronephrosis. No urolithiasis. Stomach/Bowel: Stomach physiologically distended. Small hiatal hernia. There are no dilated or thickened small bowel loops. Moderate volume of stool throughout the colon without colonic wall thickening. Distal colonic diverticulosis without diverticulitis. The appendix is normal. Vascular/Lymphatic: No retroperitoneal adenopathy. Abdominal aorta is normal in caliber. Aortic atherosclerosis without aneurysm. Reproductive: Prominent sized prostate gland. Bladder: Minimally distended, minimal wall thickening likely related to nondistention. Other: No free air, free fluid, or intra-abdominal fluid collection. Musculoskeletal: There are no acute or suspicious osseous abnormalities. Facet arthropathy in the spine. IMPRESSION: 1. Contracted gallbladder with stones or sludge. No pericholecystic inflammation. 2. No CT findings of pancreatitis. 3. Markedly atrophic left kidney with compensatory hypertrophy of the right kidney. 4. Additional chronic findings as described. Electronically Signed   By: Jeb Levering M.D.   On: 06/15/2015 03:41   Dg Chest Port 1 View  06/14/2015  CLINICAL DATA:  Chest pain. Short of breath. Hypertension. CHF. Pneumonia. EXAM: PORTABLE CHEST 1 VIEW COMPARISON:  03/18/2015 CT and 03/13/2015 plain film. FINDINGS: Numerous leads and wires project over the chest. Dialysis catheter with tips at cavoatrial junction and high right atrium. Midline trachea. Prior median sternotomy. Pacer/AICD device. Cardiomegaly accentuated by AP portable technique. No pleural effusion or pneumothorax. Low lung volumes with resultant pulmonary interstitial prominence. IMPRESSION: No acute cardiopulmonary disease. Cardiomegaly without congestive failure. Electronically Signed   By: Abigail Miyamoto M.D.   On: 06/14/2015 14:07    Medications: Infusions:    Scheduled Medications: . amLODipine  10 mg Oral Daily  . calcitRIOL  1 mcg Oral Q M,W,F-HD  . calcium acetate  1,334 mg Oral TID WC  . carvedilol  6.25 mg Oral BID WC  . cinacalcet  30 mg Oral Q supper  . gi cocktail  30 mL Oral Once  . heparin  5,000 Units Subcutaneous Q8H  . insulin aspart  0-9 Units Subcutaneous TID WC  . isosorbide mononitrate  60 mg Oral Daily  . multivitamin  1 tablet Oral QHS    have reviewed scheduled and prn medications.  Physical Exam: General: sitting up in chair- NAD Heart: RRR Lungs: clear Abdomen: soft, non tender Extremities: no overt peripheral edema Dialysis Access: tight sided PC and right AVF good thrill and bruit     06/16/2015,8:09 AM  LOS: 1 day

## 2015-06-16 NOTE — Progress Notes (Signed)
Family Medicine Teaching Service Daily Progress Note Intern Pager: [phone]  Patient name: Eric [name] Medical record number: [id number] Date of birth: 1955-08-07 Age: 60 y.o. Gender: male  Primary Care Provider: [name], NP Consultants: None Code Status: Full  Pt Overview and Major Events to Date:   Assessment and Plan: Eric [name] is a 60 y.o. male presenting with chest pain/HTN . PMH is significant for HTN, HLD, Cardiac arrest, HFrEF, AICD, MI (2015), ESRD, Type 2 DM, CABG (1999), Hepatitis C (cirrhosis)  Chest Pain: Ruled out MI, per cardiology concern for agnina, will get a Myoview this AM. Heart Score 4. Patient with hx of CABG (1999), cardiac arrest. Risk factors include HTN, HLD, DM, hx of smoking. Blood pressure was elevated on arrival 173/97, however improved in the ED. Patient with no radiation of pain to the back. No hoariness of voice, or signs of horner syndrome. As patient's blood pressure is improving and no radiation of chest pain, would not consider this likely to be aortic dissection. Patient significant hx of cardiac issues, therefore will rule out for ACS. Troponin x 0.04 x 3, EKG ST depression and elevation. - Cardiology consulted for surgery-  myoview today - Vitals per floor - IMDUR and Coreg 6.25 mg x BID   Abdominal pain possibly 2/2 gallstone pancreatitis No epigastric pain this morning. Feel better. Would like to advance his diet. On admission,  Epigastric/RUQ pain with fullness and bloating, no vomiting. Lipase 195, unclear if this may be as a result of ESRD / Dialysis vs pancreatitis. Abdominal CT  with contracted gallbladder with stones or sludge. No pericholecystic inflammation; No CT findings of pancreatitis. - Per surgery will consider cholecystomy, needs cardiology r/o - Fenatyl 12.5 mcg every 2 prn -  No Fentanyl overnight   Diffuse Numbness: Neuro exam normal, normal strength throughout. Possibly due to dialysis vs. Diabetic neuropathy,  however seems to be acute onset. Per patient has VP shunt.  RPR non-reactive  and B12 normal   HFrEF: EF 45-50%, moderate concentric hypertrophy, Systolic pressure was mildly to moderately increased. PA peak pressure: 47 mm Hg (S). Patient with SOB, no swelling in legs, no crackles of lung exam. BNP 75.2, CXR negative for pulmonary edema   HTN BP 140/81 - Continue home clonidine, hydralazine, Norvasc 10 mg   ESRD: Received dialysis last night. M/W/F dialysis - consult nephrology - Renal panel   T2DM. Holding Glipizide   - CBGS ACQHS  - Sensitive sliding scale   FEN/GI: NPO until Myoview,  Prophylaxis: heparin SBQ  Disposition: Telemetry   Subjective:  Patient doing well this morning. He wants a different diet. He states clear liquid diet is "for the birds." Discussed giving him fulls today after patient gets his myoview.   Objective: Temp:  [96.9 F (36.1 C)-98.1 F (36.7 C)] 98.1 F (36.7 C) (05/24 0605) Pulse Rate:  [64-79] 71 (05/24 0605) Resp:  [12-20] 16 (05/23 1910) BP: (69-166)/(48-87) 158/87 mmHg (05/24 0605) SpO2:  [99 %-100 %] 100 % (05/24 0605) Weight:  [183 lb 10.3 oz (83.3 kg)-185 lb 13.6 oz (84.3 kg)] 185 lb 13.6 oz (84.3 kg) (05/24 0502) Physical Exam: General: Patient lying bed, NAD Cardiovascular: RRR, no murmurs  Respiratory: CTAB, no wheezing,  Abdomen: BS+, no ttp, no distention  Extremities: moving all extremities   Laboratory:  Recent Labs Lab 06/14/15 1355 06/15/15 0017  WBC 6.0 6.3  HGB 12.0* 10.7*  HCT 35.8* 33.2*  PLT 130* 127*    Recent Labs Lab 06/14/15 1355  06/15/15 0017  NA 131* 130*  K 4.4 4.3  CL 96* 95*  CO2 22 24  BUN 56* 60*  CREATININE 9.05* 9.42*  CALCIUM 8.1* 7.8*  PROT 9.2*  --   BILITOT 0.6  --   ALKPHOS 66  --   ALT 18  --   AST 11*  --   GLUCOSE 129* 139*       Recent Labs Lab 06/14/15 1355 06/14/15 1817 06/14/15 2130 06/15/15 0017  TROPONINI 0.03 0.04* 0.04* 0.04*     Imaging/Diagnostic  Tests: Ct Abdomen Pelvis Wo Contrast  06/15/2015  CLINICAL DATA:  Right upper quadrant abdominal pain.  Pancreatitis. EXAM: CT ABDOMEN AND PELVIS WITHOUT CONTRAST TECHNIQUE: Multidetector CT imaging of the abdomen and pelvis was performed following the standard protocol without IV contrast. COMPARISON:  None. FINDINGS: Lower chest: The heart is enlarged. There are coronary artery calcifications. Pacemaker partially included. Minimal dependent atelectasis. Prominent size. No evidence of focal lesion allowing for lack contrast. Liver:  No focal lesion allowing for lack contrast.  Prominent size. Hepatobiliary: Partially distended. Intraluminal hyperdensity may be stones or sludge. No pericholecystic inflammation. Pancreas: No peripancreatic inflammation. No ductal dilatation. Pancreatic tail appears truncated. Spleen: Prominent size measuring 13.9 cm cranial caudal. Adrenal glands: No nodule. Kidneys: Atrophic right kidney with compensatory hypertrophy of the right kidney. No hydronephrosis. No urolithiasis. Stomach/Bowel: Stomach physiologically distended. Small hiatal hernia. There are no dilated or thickened small bowel loops. Moderate volume of stool throughout the colon without colonic wall thickening. Distal colonic diverticulosis without diverticulitis. The appendix is normal. Vascular/Lymphatic: No retroperitoneal adenopathy. Abdominal aorta is normal in caliber. Aortic atherosclerosis without aneurysm. Reproductive: Prominent sized prostate gland. Bladder: Minimally distended, minimal wall thickening likely related to nondistention. Other: No free air, free fluid, or intra-abdominal fluid collection. Musculoskeletal: There are no acute or suspicious osseous abnormalities. Facet arthropathy in the spine. IMPRESSION: 1. Contracted gallbladder with stones or sludge. No pericholecystic inflammation. 2. No CT findings of pancreatitis. 3. Markedly atrophic left kidney with compensatory hypertrophy of the right  kidney. 4. Additional chronic findings as described. Electronically Signed   By: Jeb Levering M.D.   On: 06/15/2015 03:41   Dg Chest Port 1 View  06/14/2015  CLINICAL DATA:  Chest pain. Short of breath. Hypertension. CHF. Pneumonia. EXAM: PORTABLE CHEST 1 VIEW COMPARISON:  03/18/2015 CT and 03/13/2015 plain film. FINDINGS: Numerous leads and wires project over the chest. Dialysis catheter with tips at cavoatrial junction and high right atrium. Midline trachea. Prior median sternotomy. Pacer/AICD device. Cardiomegaly accentuated by AP portable technique. No pleural effusion or pneumothorax. Low lung volumes with resultant pulmonary interstitial prominence. IMPRESSION: No acute cardiopulmonary disease. Cardiomegaly without congestive failure. Electronically Signed   By: Abigail Miyamoto M.D.   On: 06/14/2015 14:07    Krystina Strieter Cletis Media, MD 06/16/2015, 8:39 AM PGY-1, [place] Intern pager: [phone], text pages welcome

## 2015-06-17 ENCOUNTER — Inpatient Hospital Stay (HOSPITAL_COMMUNITY): Payer: Medicaid Other | Admitting: Certified Registered"

## 2015-06-17 ENCOUNTER — Encounter (HOSPITAL_COMMUNITY)
Admission: EM | Disposition: A | Discharge status: Home / Self Care | Payer: Self-pay | Source: Home / Self Care | Attending: Family Medicine

## 2015-06-17 HISTORY — PX: CHOLECYSTECTOMY: SHX55

## 2015-06-17 LAB — RENAL FUNCTION PANEL
ALBUMIN: 2.8 g/dL — AB (ref 3.5–5.0)
ANION GAP: 11 (ref 5–15)
Albumin: 2.6 g/dL — ABNORMAL LOW (ref 3.5–5.0)
Anion gap: 7 (ref 5–15)
BUN: 31 mg/dL — ABNORMAL HIGH (ref 6–20)
BUN: 34 mg/dL — AB (ref 6–20)
CALCIUM: 7.7 mg/dL — AB (ref 8.9–10.3)
CHLORIDE: 88 mmol/L — AB (ref 101–111)
CHLORIDE: 89 mmol/L — AB (ref 101–111)
CO2: 24 mmol/L (ref 22–32)
CO2: 25 mmol/L (ref 22–32)
CREATININE: 6.18 mg/dL — AB (ref 0.61–1.24)
Calcium: 7.7 mg/dL — ABNORMAL LOW (ref 8.9–10.3)
Creatinine, Ser: 6.06 mg/dL — ABNORMAL HIGH (ref 0.61–1.24)
GFR calc Af Amer: 10 mL/min — ABNORMAL LOW (ref >60)
GFR calc non Af Amer: 9 mL/min — ABNORMAL LOW (ref >60)
GFR, EST AFRICAN AMERICAN: 10 mL/min — AB (ref >60)
GFR, EST NON AFRICAN AMERICAN: 9 mL/min — AB (ref >60)
GLUCOSE: 66 mg/dL (ref 65–99)
Glucose, Bld: 81 mg/dL (ref 65–99)
Phosphorus: 4.4 mg/dL (ref 2.5–4.6)
Phosphorus: 4.4 mg/dL (ref 2.5–4.6)
Potassium: 4.7 mmol/L (ref 3.5–5.1)
Potassium: 4.9 mmol/L (ref 3.5–5.1)
SODIUM: 123 mmol/L — AB (ref 135–145)
SODIUM: 121 mmol/L — AB (ref 135–145)

## 2015-06-17 LAB — CBC
HCT: 29.1 % — ABNORMAL LOW (ref 39.0–52.0)
HEMATOCRIT: 30.3 % — AB (ref 39.0–52.0)
HEMOGLOBIN: 9.9 g/dL — AB (ref 13.0–17.0)
HEMOGLOBIN: 10.4 g/dL — AB (ref 13.0–17.0)
MCH: 29.3 pg (ref 26.0–34.0)
MCH: 29.1 pg (ref 26.0–34.0)
MCHC: 34 g/dL (ref 30.0–36.0)
MCHC: 34.3 g/dL (ref 30.0–36.0)
MCV: 86.1 fL (ref 78.0–100.0)
MCV: 84.6 fL (ref 78.0–100.0)
PLATELETS: 120 10*3/uL — AB (ref 150–400)
Platelets: 131 10*3/uL — ABNORMAL LOW (ref 150–400)
RBC: 3.38 MIL/uL — AB (ref 4.22–5.81)
RBC: 3.58 MIL/uL — AB (ref 4.22–5.81)
RDW: 12.4 % (ref 11.5–15.5)
RDW: 12.3 % (ref 11.5–15.5)
WBC: 5.8 10*3/uL (ref 4.0–10.5)
WBC: 5 10*3/uL (ref 4.0–10.5)

## 2015-06-17 LAB — GLUCOSE, CAPILLARY
GLUCOSE-CAPILLARY: 75 mg/dL (ref 65–99)
GLUCOSE-CAPILLARY: 85 mg/dL (ref 65–99)
GLUCOSE-CAPILLARY: 74 mg/dL (ref 65–99)
Glucose-Capillary: 77 mg/dL (ref 65–99)
Glucose-Capillary: 118 mg/dL — ABNORMAL HIGH (ref 65–99)

## 2015-06-17 LAB — HEPATITIS B SURFACE ANTIGEN: Hepatitis B Surface Ag: NEGATIVE

## 2015-06-17 SURGERY — LAPAROSCOPIC CHOLECYSTECTOMY
Anesthesia: General | Site: Abdomen

## 2015-06-17 MED ORDER — PROPOFOL 10 MG/ML IV BOLUS
INTRAVENOUS | Status: DC | PRN
  Administered 2015-06-17: 50 mg via INTRAVENOUS
  Administered 2015-06-17: 150 mg via INTRAVENOUS

## 2015-06-17 MED ORDER — CEFAZOLIN SODIUM-DEXTROSE 2-4 GM/100ML-% IV SOLN
2.0000 g | INTRAVENOUS | Status: DC
  Filled 2015-06-17: qty 100

## 2015-06-17 MED ORDER — HYDROMORPHONE HCL 1 MG/ML IJ SOLN
INTRAMUSCULAR | Status: AC
  Administered 2015-06-17: 0.5 mg via INTRAVENOUS
  Filled 2015-06-17: qty 1

## 2015-06-17 MED ORDER — 0.9 % SODIUM CHLORIDE (POUR BTL) OPTIME
TOPICAL | Status: DC | PRN
  Administered 2015-06-17: 1000 mL

## 2015-06-17 MED ORDER — GLYCOPYRROLATE 0.2 MG/ML IV SOSY
PREFILLED_SYRINGE | INTRAVENOUS | Status: AC
  Filled 2015-06-17: qty 3

## 2015-06-17 MED ORDER — PROPOFOL 10 MG/ML IV BOLUS
INTRAVENOUS | Status: AC
  Filled 2015-06-17: qty 20

## 2015-06-17 MED ORDER — FENTANYL CITRATE (PF) 100 MCG/2ML IJ SOLN
INTRAMUSCULAR | Status: DC | PRN
  Administered 2015-06-17: 100 ug via INTRAVENOUS
  Administered 2015-06-17 (×3): 50 ug via INTRAVENOUS

## 2015-06-17 MED ORDER — SODIUM CHLORIDE 0.9 % IR SOLN
Status: DC | PRN
  Administered 2015-06-17: 1000 mL

## 2015-06-17 MED ORDER — BUPIVACAINE-EPINEPHRINE 0.25% -1:200000 IJ SOLN
INTRAMUSCULAR | Status: DC | PRN
  Administered 2015-06-17: 20 mL

## 2015-06-17 MED ORDER — ONDANSETRON HCL 4 MG/2ML IJ SOLN
INTRAMUSCULAR | Status: AC
  Filled 2015-06-17: qty 2

## 2015-06-17 MED ORDER — CEFAZOLIN SODIUM 1 G IJ SOLR
INTRAMUSCULAR | Status: DC | PRN
  Administered 2015-06-17: 2 g via INTRAMUSCULAR

## 2015-06-17 MED ORDER — LIDOCAINE 2% (20 MG/ML) 5 ML SYRINGE
INTRAMUSCULAR | Status: AC
  Filled 2015-06-17: qty 10

## 2015-06-17 MED ORDER — NEOSTIGMINE METHYLSULFATE 10 MG/10ML IV SOLN
INTRAVENOUS | Status: DC | PRN
  Administered 2015-06-17: 3 mg via INTRAVENOUS

## 2015-06-17 MED ORDER — HYDROMORPHONE HCL 1 MG/ML IJ SOLN
0.2500 mg | INTRAMUSCULAR | Status: DC | PRN
  Administered 2015-06-17 (×3): 0.5 mg via INTRAVENOUS

## 2015-06-17 MED ORDER — GLYCOPYRROLATE 0.2 MG/ML IJ SOLN
INTRAMUSCULAR | Status: DC | PRN
  Administered 2015-06-17: 0.4 mg via INTRAVENOUS

## 2015-06-17 MED ORDER — HYDROMORPHONE HCL 1 MG/ML IJ SOLN
INTRAMUSCULAR | Status: AC
  Filled 2015-06-17: qty 1

## 2015-06-17 MED ORDER — SODIUM CHLORIDE 0.9 % IV SOLN
INTRAVENOUS | Status: DC
  Administered 2015-06-17 (×2): via INTRAVENOUS

## 2015-06-17 MED ORDER — FENTANYL CITRATE (PF) 250 MCG/5ML IJ SOLN
INTRAMUSCULAR | Status: AC
  Filled 2015-06-17: qty 5

## 2015-06-17 MED ORDER — SUCCINYLCHOLINE CHLORIDE 20 MG/ML IJ SOLN
INTRAMUSCULAR | Status: DC | PRN
  Administered 2015-06-17: 100 mg via INTRAVENOUS

## 2015-06-17 MED ORDER — ROCURONIUM BROMIDE 50 MG/5ML IV SOLN
INTRAVENOUS | Status: AC
  Filled 2015-06-17: qty 1

## 2015-06-17 MED ORDER — MIDAZOLAM HCL 2 MG/2ML IJ SOLN
INTRAMUSCULAR | Status: AC
  Filled 2015-06-17: qty 2

## 2015-06-17 MED ORDER — OXYCODONE-ACETAMINOPHEN 5-325 MG PO TABS
1.0000 | ORAL_TABLET | ORAL | Status: DC | PRN
  Administered 2015-06-17 – 2015-06-18 (×2): 1 via ORAL
  Filled 2015-06-17 (×3): qty 1

## 2015-06-17 MED ORDER — EPHEDRINE SULFATE 50 MG/ML IJ SOLN
INTRAMUSCULAR | Status: DC | PRN
  Administered 2015-06-17: 10 mg via INTRAVENOUS

## 2015-06-17 MED ORDER — EPHEDRINE 5 MG/ML INJ
INTRAVENOUS | Status: AC
  Filled 2015-06-17: qty 20

## 2015-06-17 MED ORDER — EPHEDRINE 5 MG/ML INJ
INTRAVENOUS | Status: AC
  Filled 2015-06-17: qty 10

## 2015-06-17 MED ORDER — ROCURONIUM BROMIDE 100 MG/10ML IV SOLN
INTRAVENOUS | Status: DC | PRN
  Administered 2015-06-17: 10 mg via INTRAVENOUS

## 2015-06-17 MED ORDER — SUGAMMADEX SODIUM 200 MG/2ML IV SOLN
INTRAVENOUS | Status: AC
  Filled 2015-06-17: qty 2

## 2015-06-17 MED ORDER — NEOSTIGMINE METHYLSULFATE 5 MG/5ML IV SOSY
PREFILLED_SYRINGE | INTRAVENOUS | Status: AC
  Filled 2015-06-17: qty 5

## 2015-06-17 MED ORDER — BUPIVACAINE-EPINEPHRINE (PF) 0.25% -1:200000 IJ SOLN
INTRAMUSCULAR | Status: AC
  Filled 2015-06-17: qty 30

## 2015-06-17 MED ORDER — LIDOCAINE HCL (CARDIAC) 20 MG/ML IV SOLN
INTRAVENOUS | Status: DC | PRN
  Administered 2015-06-17: 80 mg via INTRAVENOUS

## 2015-06-17 MED ORDER — CEFAZOLIN SODIUM 1 G IJ SOLR
INTRAMUSCULAR | Status: AC
  Filled 2015-06-17: qty 40

## 2015-06-17 MED ORDER — DARBEPOETIN ALFA 100 MCG/0.5ML IJ SOSY
PREFILLED_SYRINGE | INTRAMUSCULAR | Status: AC
  Administered 2015-06-17: 100 ug via INTRAVENOUS
  Filled 2015-06-17: qty 0.5

## 2015-06-17 MED ORDER — MIDAZOLAM HCL 5 MG/5ML IJ SOLN
INTRAMUSCULAR | Status: DC | PRN
  Administered 2015-06-17: 2 mg via INTRAVENOUS

## 2015-06-17 MED ORDER — DARBEPOETIN ALFA 100 MCG/0.5ML IJ SOSY
100.0000 ug | PREFILLED_SYRINGE | INTRAMUSCULAR | Status: DC
  Administered 2015-06-17: 100 ug via INTRAVENOUS

## 2015-06-17 SURGICAL SUPPLY — 35 items
APPLIER CLIP 5 13 M/L LIGAMAX5 (MISCELLANEOUS) ×3
CANISTER SUCTION 2500CC (MISCELLANEOUS) ×3 IMPLANT
CHLORAPREP W/TINT 26ML (MISCELLANEOUS) ×3 IMPLANT
CLIP APPLIE 5 13 M/L LIGAMAX5 (MISCELLANEOUS) ×1 IMPLANT
COVER SURGICAL LIGHT HANDLE (MISCELLANEOUS) ×3 IMPLANT
ELECT REM PT RETURN 9FT ADLT (ELECTROSURGICAL) ×3
ELECTRODE REM PT RTRN 9FT ADLT (ELECTROSURGICAL) ×1 IMPLANT
GLOVE BIO SURGEON STRL SZ7.5 (GLOVE) ×3 IMPLANT
GLOVE BIOGEL PI IND STRL 8 (GLOVE) ×1 IMPLANT
GLOVE BIOGEL PI INDICATOR 8 (GLOVE) ×2
GLOVE SURG SIGNA 7.5 PF LTX (GLOVE) ×3 IMPLANT
GOWN STRL REUS W/ TWL LRG LVL3 (GOWN DISPOSABLE) ×2 IMPLANT
GOWN STRL REUS W/ TWL XL LVL3 (GOWN DISPOSABLE) ×1 IMPLANT
GOWN STRL REUS W/TWL LRG LVL3 (GOWN DISPOSABLE) ×4
GOWN STRL REUS W/TWL XL LVL3 (GOWN DISPOSABLE) ×2
KIT BASIN OR (CUSTOM PROCEDURE TRAY) ×3 IMPLANT
KIT ROOM TURNOVER OR (KITS) ×3 IMPLANT
LIQUID BAND (GAUZE/BANDAGES/DRESSINGS) ×3 IMPLANT
NS IRRIG 1000ML POUR BTL (IV SOLUTION) ×3 IMPLANT
PAD ARMBOARD 7.5X6 YLW CONV (MISCELLANEOUS) ×3 IMPLANT
POUCH SPECIMEN RETRIEVAL 10MM (ENDOMECHANICALS) ×3 IMPLANT
SCISSORS LAP 5X35 DISP (ENDOMECHANICALS) ×3 IMPLANT
SET IRRIG TUBING LAPAROSCOPIC (IRRIGATION / IRRIGATOR) ×3 IMPLANT
SLEEVE ENDOPATH XCEL 5M (ENDOMECHANICALS) ×6 IMPLANT
SPECIMEN JAR SMALL (MISCELLANEOUS) ×3 IMPLANT
SUT MON AB 4-0 PC3 18 (SUTURE) ×3 IMPLANT
SUT VICRYL 0 UR6 27IN ABS (SUTURE) ×3 IMPLANT
TOWEL OR 17X24 6PK STRL BLUE (TOWEL DISPOSABLE) ×3 IMPLANT
TOWEL OR 17X26 10 PK STRL BLUE (TOWEL DISPOSABLE) ×3 IMPLANT
TRAY LAPAROSCOPIC MC (CUSTOM PROCEDURE TRAY) ×3 IMPLANT
TROCAR BLADELESS 11MM (ENDOMECHANICALS) ×3 IMPLANT
TROCAR XCEL BLUNT TIP 100MML (ENDOMECHANICALS) ×3 IMPLANT
TROCAR XCEL NON-BLD 11X100MML (ENDOMECHANICALS) ×3 IMPLANT
TROCAR XCEL NON-BLD 5MMX100MML (ENDOMECHANICALS) ×3 IMPLANT
TUBING INSUFFLATION (TUBING) ×3 IMPLANT

## 2015-06-17 NOTE — Anesthesia Procedure Notes (Signed)
Procedure Name: Intubation Date/Time: 06/17/2015 2:02 PM Performed by: Lance Coon Pre-anesthesia Checklist: Patient identified, Emergency Drugs available, Suction available, Patient being monitored and Timeout performed Patient Re-evaluated:Patient Re-evaluated prior to inductionOxygen Delivery Method: Circle system utilized Preoxygenation: Pre-oxygenation with 100% oxygen Intubation Type: IV induction Ventilation: Mask ventilation without difficulty Laryngoscope Size: Miller and 2 Grade View: Grade I Tube type: Oral Tube size: 7.5 mm Number of attempts: 1 Airway Equipment and Method: Stylet Placement Confirmation: ETT inserted through vocal cords under direct vision,  positive ETCO2 and breath sounds checked- equal and bilateral Secured at: 22 cm Tube secured with: Tape Dental Injury: Teeth and Oropharynx as per pre-operative assessment

## 2015-06-17 NOTE — Anesthesia Postprocedure Evaluation (Signed)
Anesthesia Post Note  Patient: Eric [name]  Procedure(s) Performed: Procedure(s) (LRB): LAPAROSCOPIC CHOLECYSTECTOMY (N/A)  Patient location during evaluation: PACU Anesthesia Type: General Level of consciousness: awake and alert Pain management: pain level controlled Vital Signs Assessment: post-procedure vital signs reviewed and stable Respiratory status: spontaneous breathing, nonlabored ventilation, respiratory function stable and patient connected to nasal cannula oxygen Cardiovascular status: blood pressure returned to baseline and stable Postop Assessment: no signs of nausea or vomiting Anesthetic complications: no    Last Vitals:  Filed Vitals:   06/17/15 1630 06/17/15 1640  BP:  138/80  Pulse: 84 79  Temp:  36.6 C  Resp:  12    Last Pain:  Filed Vitals:   06/17/15 1643  PainSc: 4                  Tiajuana Amass

## 2015-06-17 NOTE — Procedures (Signed)
Patient was seen on dialysis and the procedure was supervised.  BFR 400  Via PC BP is  129/77.   Patient appears to be tolerating treatment well  Jalexa Pifer A 06/17/2015

## 2015-06-17 NOTE — Progress Notes (Signed)
Patient ID: Eric [name], male   DOB: [birth year], 60 y.o.   MRN: [id number]      [facility]      [address] [place], [place] [address]    Phone: [phone] FAX: [phone]     Subjective: No n/v. Bloated.  Hungry.  Has been npo. Pt seen in HD.   Objective:  Vital signs:  Filed Vitals:   06/17/15 0859 06/17/15 0929 06/17/15 0958 06/17/15 1030  BP: 120/72 126/83 136/83 138/78  Pulse: 72 74 75 77  Temp:      TempSrc:      Resp: '16 23 24 16  '$ Weight:      SpO2:        Last BM Date: 06/17/15  Intake/Output   Yesterday:    This shift:     Physical Exam: General: Pt awake/alert/oriented x4 in no acute distress Chest: cta.  No chest wall pain w good excursion CV:  Pulses intact.  Regular rhythm MS: Normal AROM mjr joints.  No obvious deformity Abdomen: Soft.  Nondistended.  Non tender.   No evidence of peritonitis.  No incarcerated hernias.    Problem List:   Active Problems:   Chest pain   Numbness   RUQ abdominal pain   Gallbladder calculus   Pancreatitis   Gallstones    Results:   Labs: Results for orders placed or performed during the hospital encounter of 06/14/15 (from the past 48 hour(s))  Glucose, capillary     Status: None   Collection Time: 06/15/15 11:18 AM  Result Value Ref Range   Glucose-Capillary 76 65 - 99 mg/dL  Glucose, capillary     Status: Abnormal   Collection Time: 06/15/15  9:26 PM  Result Value Ref Range   Glucose-Capillary 114 (H) 65 - 99 mg/dL   Comment 1 Notify RN    Comment 2 Document in Chart   Glucose, capillary     Status: None   Collection Time: 06/16/15  6:38 AM  Result Value Ref Range   Glucose-Capillary 96 65 - 99 mg/dL   Comment 1 Notify RN    Comment 2 Document in Chart   Renal function panel     Status: Abnormal   Collection Time: 06/16/15 12:11 PM  Result Value Ref Range   Sodium 123 (L) 135 - 145 mmol/L    Comment: DELTA CHECK NOTED   Potassium 4.9  3.5 - 5.1 mmol/L   Chloride 88 (L) 101 - 111 mmol/L   CO2 26 22 - 32 mmol/L   Glucose, Bld 127 (H) 65 - 99 mg/dL   BUN 27 (H) 6 - 20 mg/dL   Creatinine, Ser 5.65 (H) 0.61 - 1.24 mg/dL    Comment: DELTA CHECK NOTED   Calcium 8.3 (L) 8.9 - 10.3 mg/dL   Phosphorus 4.6 2.5 - 4.6 mg/dL   Albumin 2.9 (L) 3.5 - 5.0 g/dL   GFR calc non Af Amer 10 (L) >60 mL/min   GFR calc Af Amer 11 (L) >60 mL/min    Comment: (NOTE) The eGFR has been calculated using the CKD EPI equation. This calculation has not been validated in all clinical situations. eGFR's persistently <60 mL/min signify possible Chronic Kidney Disease.    Anion gap 9 5 - 15  Glucose, capillary     Status: Abnormal   Collection Time: 06/16/15  4:11 PM  Result Value Ref Range   Glucose-Capillary 193 (H) 65 - 99 mg/dL  Glucose, capillary  Status: Abnormal   Collection Time: 06/16/15  9:01 PM  Result Value Ref Range   Glucose-Capillary 58 (L) 65 - 99 mg/dL   Comment 1 Notify RN    Comment 2 Document in Chart   Glucose, capillary     Status: Abnormal   Collection Time: 06/16/15  9:43 PM  Result Value Ref Range   Glucose-Capillary 100 (H) 65 - 99 mg/dL  Glucose, capillary     Status: None   Collection Time: 06/17/15  1:50 AM  Result Value Ref Range   Glucose-Capillary 75 65 - 99 mg/dL   Comment 1 Notify RN    Comment 2 Document in Chart   Renal function panel     Status: Abnormal   Collection Time: 06/17/15  3:32 AM  Result Value Ref Range   Sodium 123 (L) 135 - 145 mmol/L   Potassium 4.7 3.5 - 5.1 mmol/L   Chloride 88 (L) 101 - 111 mmol/L   CO2 24 22 - 32 mmol/L   Glucose, Bld 66 65 - 99 mg/dL   BUN 31 (H) 6 - 20 mg/dL   Creatinine, Ser 6.06 (H) 0.61 - 1.24 mg/dL   Calcium 7.7 (L) 8.9 - 10.3 mg/dL   Phosphorus 4.4 2.5 - 4.6 mg/dL   Albumin 2.6 (L) 3.5 - 5.0 g/dL   GFR calc non Af Amer 9 (L) >60 mL/min   GFR calc Af Amer 10 (L) >60 mL/min    Comment: (NOTE) The eGFR has been calculated using the CKD EPI  equation. This calculation has not been validated in all clinical situations. eGFR's persistently <60 mL/min signify possible Chronic Kidney Disease.    Anion gap 11 5 - 15  CBC     Status: Abnormal   Collection Time: 06/17/15  3:32 AM  Result Value Ref Range   WBC 5.8 4.0 - 10.5 K/uL   RBC 3.38 (L) 4.22 - 5.81 MIL/uL   Hemoglobin 9.9 (L) 13.0 - 17.0 g/dL   HCT 29.1 (L) 39.0 - 52.0 %   MCV 86.1 78.0 - 100.0 fL   MCH 29.3 26.0 - 34.0 pg   MCHC 34.0 30.0 - 36.0 g/dL   RDW 12.4 11.5 - 15.5 %   Platelets 120 (L) 150 - 400 K/uL  Glucose, capillary     Status: None   Collection Time: 06/17/15  6:12 AM  Result Value Ref Range   Glucose-Capillary 85 65 - 99 mg/dL   Comment 1 Notify RN    Comment 2 Document in Chart   Renal function panel     Status: Abnormal   Collection Time: 06/17/15  8:45 AM  Result Value Ref Range   Sodium 121 (L) 135 - 145 mmol/L   Potassium 4.9 3.5 - 5.1 mmol/L   Chloride 89 (L) 101 - 111 mmol/L   CO2 25 22 - 32 mmol/L   Glucose, Bld 81 65 - 99 mg/dL   BUN 34 (H) 6 - 20 mg/dL   Creatinine, Ser 6.18 (H) 0.61 - 1.24 mg/dL   Calcium 7.7 (L) 8.9 - 10.3 mg/dL   Phosphorus 4.4 2.5 - 4.6 mg/dL   Albumin 2.8 (L) 3.5 - 5.0 g/dL   GFR calc non Af Amer 9 (L) >60 mL/min   GFR calc Af Amer 10 (L) >60 mL/min    Comment: (NOTE) The eGFR has been calculated using the CKD EPI equation. This calculation has not been validated in all clinical situations. eGFR's persistently <60 mL/min signify possible Chronic Kidney Disease.  Anion gap 7 5 - 15  CBC     Status: Abnormal   Collection Time: 06/17/15  8:45 AM  Result Value Ref Range   WBC 5.0 4.0 - 10.5 K/uL   RBC 3.58 (L) 4.22 - 5.81 MIL/uL   Hemoglobin 10.4 (L) 13.0 - 17.0 g/dL   HCT 30.3 (L) 39.0 - 52.0 %   MCV 84.6 78.0 - 100.0 fL   MCH 29.1 26.0 - 34.0 pg   MCHC 34.3 30.0 - 36.0 g/dL   RDW 12.3 11.5 - 15.5 %   Platelets 131 (L) 150 - 400 K/uL    Imaging / Studies: Nm Myocar Multi W/spect W/wall Motion /  Ef  06/16/2015   Defect 1: There is a medium defect of mild severity present in the basal inferior and mid inferior location.  Findings consistent with prior myocardial infarction.  The left ventricular ejection fraction is mildly decreased (45-54%).  Nuclear stress EF: 51%.     Medications / Allergies:  Scheduled Meds: . amLODipine  10 mg Oral Daily  . calcitRIOL  1 mcg Oral Q M,W,F-HD  . calcium acetate  1,334 mg Oral TID WC  . carvedilol  6.25 mg Oral BID WC  .  ceFAZolin (ANCEF) IV  2 g Intravenous On Call to OR  . cinacalcet  30 mg Oral Q supper  . Darbepoetin Alfa      . darbepoetin (ARANESP) injection - DIALYSIS  100 mcg Intravenous Q Thu-HD  . gi cocktail  30 mL Oral Once  . heparin  5,000 Units Subcutaneous Q8H  . insulin aspart  0-9 Units Subcutaneous TID WC  . isosorbide mononitrate  60 mg Oral Daily  . multivitamin  1 tablet Oral QHS   Continuous Infusions:  PRN Meds:.acetaminophen, cloNIDine, fentaNYL (SUBLIMAZE) injection, gi cocktail, ondansetron (ZOFRAN) IV  Antibiotics: Anti-infectives    Start     Dose/Rate Route Frequency Ordered Stop   06/17/15 1030  ceFAZolin (ANCEF) IVPB 2g/100 mL premix    Comments:  Pharmacy may adjust dosing strength, interval, or rate of medication as needed for optimal therapy for the patient  Send with patient on call to the OR.  Anesthesia to complete antibiotic administration <110mn [facility]   2 g 200 mL/hr over 30 Minutes Intravenous On call to O.R. 06/17/15 1019 06/18/15 0559        Assessment/Plan Symptomatic cholelithiasis-surgical risks discussed including infection, bleeding, injury to surrounding structures, open surgery, heart attack, DVT/PE, respiratory compromise.  The patient verbalizes understanding and wishes to proceed..Lawernce Keason call to OR NPO-NPO Dispo-to OR after HD   EErby Pian [facility] Pager [phone](7A-4:30P) For consults and floor pages  call [phone](7A-4:30P)  06/17/2015  10:52 AM

## 2015-06-17 NOTE — Progress Notes (Addendum)
Family Medicine Teaching Service Daily Progress Note Intern Pager: [phone]  Patient name: Eric [name] Medical record number: [id number] Date of birth: [birth year] Age: 60 y.o. Gender: male  Primary Care Provider: [name], NP Consultants: None Code Status: Full  Pt Overview and Major Events to Date:   Assessment and Plan: Eric [name] is a 60 y.o. male presenting with chest pain/HTN . PMH is significant for HTN, HLD, Cardiac arrest, HFrEF, AICD, MI (2015), ESRD, Type 2 DM, CABG (1999), Hepatitis C (cirrhosis)  Chest Pain: Ruled out MI, per cardiology concern for agnina, will get a Myoview this AM. Heart Score 4. Patient with hx of CABG (1999), cardiac arrest. Risk factors include HTN, HLD, DM, hx of smoking. Blood pressure was elevated on arrival 173/97, however improved in the ED. Patient with no radiation of pain to the back. No hoariness of voice, or signs of horner syndrome. As patient's blood pressure is improving and no radiation of chest pain, would not consider this likely to be aortic dissection. Patient significant hx of cardiac issues, therefore will rule out for ACS. Troponin x 0.04 x 3, EKG ST depression and elevation. - Myoview showing  a medium defect of mild severity present in the basal inferior and mid inferior location.Findings consistent with prior myocardial infarction. The left ventricular ejection fraction is mildly decreased (45-54%). Nuclear stress EF: 51%.- Per cardiology cleared lap chole  - IMDUR and Coreg 6.25 mg x BID   Abdominal pain possibly 2/2 gallstone pancreatitis Mild epigastric pain this AM. Abdominal exam with slight tenderness to palpation of the epigastric/RUQ region.  Would like to advance his diet to Regular. No nausea or vomiting. Tolerarted full liquid diet without issue yesterday. Lipase 195, unclear if this may be as a result of ESRD / Dialysis vs pancreatitis. Abdominal CT  with contracted gallbladder with stones or sludge. No  pericholecystic inflammation; No CT findings of pancreatitis. - Per surgery will consider cholecystomy 5/25 - Fenatyl 12.5 mcg every 2 prn -  No fentanyl over past 24 hours.   Diffuse Numbness: Neuro exam normal, normal strength throughout. Possibly due to dialysis vs. Diabetic neuropathy, however seems to be acute onset. Per patient has VP shunt.  RPR non-reactive  and B12 normal  - Pain episodes in legs bilaterally following phoslo yesterday, patient to discuss with nephrologist   HFrEF: EF 45-50%, moderate concentric hypertrophy, Systolic pressure was mildly to moderately increased. PA peak pressure: 47 mm Hg (S). Patient without SOB, no swelling in legs, no crackles of lung exam. BNP 75.2, CXR negative for pulmonary edema.   HTN BP 120/72 - Continue home clonidine, hydralazine, Norvasc 10 mg   ESRD: Nephrology following. M/W/F dialysis - consult nephrology - BMET with hyponatremia and other electrolyte abnormalities, receiving dialysis today   - Renal panel  T2DM. Holding Glipizide   - CBGS ACQHS  - Sensitive sliding scale   FEN/GI: NPO per surgery  Prophylaxis: heparin SBQ  Disposition: Telemetry   Subjective:  Patient states a little abdominal pain today. Otherwise is looking forward to a diet following surgery. No concerns, voiced understanding of the plan  Objective: Temp:  [97.7 F (36.5 C)-98 F (36.7 C)] 98 F (36.7 C) (05/25 0520) Pulse Rate:  [65-76] 72 (05/25 0859) Resp:  [14-18] 16 (05/25 0859) BP: (107-144)/(68-94) 120/72 mmHg (05/25 0859) SpO2:  [97 %-100 %] 100 % (05/25 0825) Weight:  [188 lb 7.9 oz (85.5 kg)-189 lb 6 oz (85.9 kg)] 189 lb 6 oz (85.9 kg) (05/25 0825) Physical  Exam: General: Patient lying bed, NAD Cardiovascular: RRR, no murmurs  Respiratory: CTAB, no wheezing,  Abdomen: BS+, no ttp, no distention  Extremities: moving all extremities   Laboratory:  Recent Labs Lab 06/15/15 0017 06/17/15 0332 06/17/15 0845  WBC 6.3 5.8 5.0  HGB  10.7* 9.9* 10.4*  HCT 33.2* 29.1* 30.3*  PLT 127* 120* 131*    Recent Labs Lab 06/14/15 1355  06/16/15 1211 06/17/15 0332 06/17/15 0845  NA 131*  < > 123* 123* 121*  K 4.4  < > 4.9 4.7 4.9  CL 96*  < > 88* 88* 89*  CO2 22  < > 26 24 25   BUN 56*  < > 27* 31* 34*  CREATININE 9.05*  < > 5.65* 6.06* 6.18*  CALCIUM 8.1*  < > 8.3* 7.7* 7.7*  PROT 9.2*  --   --   --   --   BILITOT 0.6  --   --   --   --   ALKPHOS 66  --   --   --   --   ALT 18  --   --   --   --   AST 11*  --   --   --   --   GLUCOSE 129*  < > 127* 66 81  < > = values in this interval not displayed.     Recent Labs Lab 06/14/15 1355 06/14/15 1817 06/14/15 2130 06/15/15 0017  TROPONINI 0.03 0.04* 0.04* 0.04*     Imaging/Diagnostic Tests: Nm Myocar Multi W/spect W/wall Motion / Ef  06/16/2015   Defect 1: There is a medium defect of mild severity present in the basal inferior and mid inferior location.  Findings consistent with prior myocardial infarction.  The left ventricular ejection fraction is mildly decreased (45-54%).  Nuclear stress EF: 51%.     Ashleyanne Hemmingway Cletis Media, MD 06/17/2015, 9:23 AM PGY-1, [place] Intern pager: [phone], text pages welcome

## 2015-06-17 NOTE — Op Note (Signed)
Laparoscopic Cholecystectomy Procedure Note  Indications: This patient presents with symptomatic gallbladder disease and will undergo laparoscopic cholecystectomy.  Pre-operative Diagnosis:symptomatic cholelithiaisis  Post-operative Diagnosis: Same  Surgeon: Coralie Keens A   Assistants: 0  Anesthesia: General endotracheal anesthesia  ASA Class: 3  Procedure Details  The patient was seen again in the Holding Room. The risks, benefits, complications, treatment options, and expected outcomes were discussed with the patient. The possibilities of reaction to medication, pulmonary aspiration, perforation of viscus, bleeding, recurrent infection, finding a normal gallbladder, the need for additional procedures, failure to diagnose a condition, the possible need to convert to an open procedure, and creating a complication requiring transfusion or operation were discussed with the patient. The likelihood of improving the patient's symptoms with return to their baseline status is good.  The patient and/or family concurred with the proposed plan, giving informed consent. The site of surgery properly noted. The patient was taken to Operating Room, identified as Eric [name] and the procedure verified as Laparoscopic Cholecystectomy with Intraoperative Cholangiogram. A Time Out was held and the above information confirmed.  Prior to the induction of general anesthesia, antibiotic prophylaxis was administered. General endotracheal anesthesia was then administered and tolerated well. After the induction, the abdomen was prepped with Chloraprep and draped in sterile fashion. The patient was positioned in the supine position.  Local anesthetic agent was injected into the skin near the umbilicus and an incision made. We dissected down to the abdominal fascia with blunt dissection.  The fascia was incised vertically and we entered the peritoneal cavity bluntly.  A pursestring suture of 0-Vicryl was placed  around the fascial opening.  The Hasson cannula was inserted and secured with the stay suture.  Pneumoperitoneum was then created with CO2 and tolerated well without any adverse changes in the patient's vital signs. A 5-mm port was placed in the subxiphoid position.  Two 5-mm ports were placed in the right upper quadrant. All skin incisions were infiltrated with a local anesthetic agent before making the incision and placing the trocars.   We positioned the patient in reverse Trendelenburg, tilted slightly to the patient's left.  The liver was cirrhotic in appearance. The gallbladder was identified, the fundus grasped and retracted cephalad. Adhesions were lysed bluntly and with the electrocautery where indicated, taking care not to injure any adjacent organs or viscus. The infundibulum was grasped and retracted laterally, exposing the peritoneum overlying the triangle of Calot. This was then divided and exposed in a blunt fashion. The cystic duct was clearly identified and bluntly dissected circumferentially. A critical view of the cystic duct and cystic artery was obtained.  The cystic duct was then ligated with clips and divided. The cystic artery was, dissected free, ligated with clips and divided as well. The gallbladder was necrotic and several small stones spilled out  The gallbladder was dissected from the liver bed in retrograde fashion with the electrocautery. The gallbladder was removed and placed in an Endocatch sac. The liver bed was irrigated and inspected. Hemostasis was achieved with the electrocautery. Copious irrigation was utilized and was repeatedly aspirated until clear.  The gallbladder and Endocatch sac were then removed through the umbilical port site.  I replaced the 5 mm epigastric port with an 11 mm port and tried to suction out all the spilled stones.  The umbilical port was then removed. The pursestring suture was used to close the umbilical fascia.    We again inspected the right  upper quadrant for hemostasis.  Pneumoperitoneum was released  as we removed the trocars.  4-0 Monocryl was used to close the skin.   Benzoin, steri-strips, and clean dressings were applied. The patient was then extubated and brought to the recovery room in stable condition. Instrument, sponge, and needle counts were correct at closure and at the conclusion of the case.   Findings: Chronic Cholecystitis with Cholelithiasis cirrhosis  Estimated Blood Loss: Minimal         Drains: 0         Specimens: Gallbladder           Complications: None; patient tolerated the procedure well.         Disposition: PACU - hemodynamically stable.         Condition: stable

## 2015-06-17 NOTE — Progress Notes (Signed)
Subjective:  Patient denies any anginal chest pain or shortness of breath. Denies any palpitations. No ICD discharges. tolerating hemodialysis. States abdominal pain has improved. Schedule for laparoscopic cholecystectomy later today  Objective:  Vital Signs in the last 24 hours: Temp:  [97.7 F (36.5 C)-98 F (36.7 C)] 98 F (36.7 C) (05/25 0520) Pulse Rate:  [65-76] 74 (05/25 0929) Resp:  [14-23] 23 (05/25 0929) BP: (107-144)/(68-94) 126/83 mmHg (05/25 0929) SpO2:  [97 %-100 %] 100 % (05/25 0825) Weight:  [85.5 kg (188 lb 7.9 oz)-85.9 kg (189 lb 6 oz)] 85.9 kg (189 lb 6 oz) (05/25 0825)  Intake/Output from previous day:   Intake/Output from this shift:    Physical Exam: Neck: no adenopathy, no carotid bruit, no JVD and supple, symmetrical, trachea midline Lungs: clear to auscultation bilaterally Heart: regular rate and rhythm, S1, S2 normal and Soft systolic murmur noted Abdomen: soft, non-tender; bowel sounds normal; no masses,  no organomegaly Extremities: No clubbing cyanosis 1+ edema noted  Lab Results:  Recent Labs  06/17/15 0332 06/17/15 0845  WBC 5.8 5.0  HGB 9.9* 10.4*  PLT 120* 131*    Recent Labs  06/17/15 0332 06/17/15 0845  NA 123* 121*  K 4.7 4.9  CL 88* 89*  CO2 24 25  GLUCOSE 66 81  BUN 31* 34*  CREATININE 6.06* 6.18*    Recent Labs  06/14/15 2130 06/15/15 0017  TROPONINI 0.04* 0.04*   Hepatic Function Panel  Recent Labs  06/14/15 1355  06/17/15 0845  PROT 9.2*  --   --   ALBUMIN 3.2*  < > 2.8*  AST 11*  --   --   ALT 18  --   --   ALKPHOS 66  --   --   BILITOT 0.6  --   --   < > = values in this interval not displayed. No results for input(s): CHOL in the last 72 hours. No results for input(s): PROTIME in the last 72 hours.  Imaging: Imaging results have been reviewed and Nm Myocar Multi W/spect W/wall Motion / Ef  06/16/2015   Defect 1: There is a medium defect of mild severity present in the basal inferior and mid  inferior location.  Findings consistent with prior myocardial infarction.  The left ventricular ejection fraction is mildly decreased (45-54%).  Nuclear stress EF: 51%.     Cardiac Studies:  Assessment/Plan:  Symptomatic cholelithiasis/mild acute pancreatitis Stable angina. Negative nuclear stress test Coronary artery disease status post CABG History of V. Fib cardiac arrest in the past, status post ICD. Hypertension. Diabetes mellitus. Hyperlipidemia. History of hepatitis C Cirrhosis of liver. End-stage renal disease on hemodialysis. Seizure disorder. Anemia of chronic disease. History of chronic hydrocephalus with dilated third and lateral ventricles. Hyponatremia being corrected by renal service Plan Continue present management Acceptable risk for laparoscopic cholecystectomy cardiac point of view.  LOS: 2 days    Charolette Forward 06/17/2015, 10:20 AM

## 2015-06-17 NOTE — Transfer of Care (Signed)
Immediate Anesthesia Transfer of Care Note  Patient: Eric [name]  Procedure(s) Performed: Procedure(s): LAPAROSCOPIC CHOLECYSTECTOMY (N/A)  Patient Location: PACU  Anesthesia Type:General  Level of Consciousness: sedated and patient cooperative  Airway & Oxygen Therapy: Patient Spontanous Breathing and Patient connected to nasal cannula oxygen  Post-op Assessment: Report given to RN and Post -op Vital signs reviewed and stable  Post vital signs: Reviewed and stable  Last Vitals:  Filed Vitals:   06/17/15 1100 06/17/15 1116  BP: 125/109 161/81  Pulse: 78 80  Temp:  36.1 C  Resp: 21 13    Last Pain:  Filed Vitals:   06/17/15 1146  PainSc: 6       Patients Stated Pain Goal: 0 ([id number])  Complications: No apparent anesthesia complications

## 2015-06-17 NOTE — Care Management Note (Signed)
Case Management Note Marvetta Gibbons RN, BSN Unit 2W-Case Manager [phone]  Patient Details  Name: Eric [name] MRN: [id number] Date of Birth: 1955-03-26  Subjective/Objective:   Pt admitted with chest pain - symptomatic cholelithiaisis, s/p lap chole on 06/17/15                 Action/Plan: PTA pt lived at home, [facility] following pt for community management has seen pt and given list for PCP needs- per pt was going to the [facility] but wants to change to new PCP. Pt has Medicaid- would not qualify for any medication assistance- CM to continue to follow pt for d/c needs  Expected Discharge Date:                  Expected Discharge Plan:  Home/Self Care  In-House Referral:     Discharge planning Services  CM Consult  Post Acute Care Choice:    Choice offered to:     DME Arranged:    DME Agency:     HH Arranged:    [place] Agency:     Status of Service:  In process, will continue to follow  Medicare Important Message Given:    Date Medicare IM Given:    Medicare IM give by:    Date Additional Medicare IM Given:    Additional Medicare Important Message give by:     If discussed at [place] of Stay Meetings, dates discussed:    Additional Comments:  Dawayne Patricia, RN 06/17/2015, 10:40 PM

## 2015-06-17 NOTE — Anesthesia Preprocedure Evaluation (Addendum)
Anesthesia Evaluation  Patient identified by MRN, date of birth, ID band Patient awake    Reviewed: Allergy & Precautions, NPO status , Patient's Chart, lab work & pertinent test results, reviewed documented beta blocker date and time   Airway Mallampati: III  TM Distance: >3 FB Neck ROM: Full    Dental  (+) Dental Advisory Given   Pulmonary shortness of breath, former smoker,    breath sounds clear to auscultation       Cardiovascular hypertension, Pt. on medications and Pt. on home beta blockers + Past MI, + CABG and +CHF  + Cardiac Defibrillator  Rhythm:Regular Rate:Normal  Nuclear stress test 06/16/15:     Defect 1: There is a medium defect of mild severity present in the basal inferior and mid inferior location.     Findings consistent with prior myocardial infarction.     The left ventricular ejection fraction is mildly decreased (45-54%).     Nuclear stress EF: 51%.   Neuro/Psych Seizures -,     GI/Hepatic negative GI ROS, Neg liver ROS,   Endo/Other  diabetes, Type 2  Renal/GU ESRF and DialysisRenal disease     Musculoskeletal   Abdominal   Peds  Hematology  (+) anemia ,   Anesthesia Other Findings   Reproductive/Obstetrics                            Lab Results  Component Value Date   WBC 5.0 06/17/2015   HGB 10.4* 06/17/2015   HCT 30.3* 06/17/2015   MCV 84.6 06/17/2015   PLT 131* 06/17/2015   Lab Results  Component Value Date   CREATININE 6.18* 06/17/2015   BUN 34* 06/17/2015   NA 121* 06/17/2015   K 4.9 06/17/2015   CL 89* 06/17/2015   CO2 25 06/17/2015    Anesthesia Physical Anesthesia Plan  ASA: IV  Anesthesia Plan: General   Post-op Pain Management:    Induction: Intravenous  Airway Management Planned: Oral ETT  Additional Equipment:   Intra-op Plan:   Post-operative Plan: Extubation in OR  Informed Consent: I have reviewed the patients History  and Physical, chart, labs and discussed the procedure including the risks, benefits and alternatives for the proposed anesthesia with the patient or authorized representative who has indicated his/her understanding and acceptance.   Dental advisory given  Plan Discussed with: CRNA  Anesthesia Plan Comments:         Anesthesia Quick Evaluation

## 2015-06-17 NOTE — Progress Notes (Signed)
Subjective:  No new complaints- for lap chole today plus HD Objective Vital signs in last 24 hours: Filed Vitals:   06/16/15 1106 06/16/15 1453 06/16/15 2106 06/17/15 0520  BP: 107/79 122/68 138/74 139/74  Pulse: 76 69 69 65  Temp:  97.7 F (36.5 C) 97.8 F (36.6 C) 98 F (36.7 C)  TempSrc:  Oral Oral Oral  Resp:  18 18 18   Weight:    85.5 kg (188 lb 7.9 oz)  SpO2:  98% 97% 100%   Weight change: 2 kg (4 lb 6.5 oz) No intake or output data in the 24 hours ending 06/17/15 0801  Dialysis Orders: [facility] MWF 4 hr 15 min 180 400/800 2 K 2.25 Ca EDW 81.5 right upper AVF maturing   - placed 2/23 and right IJ heparin 7000 calcitriol 1 no ESA or Fe Recent labs: Hgb 12 5/17 44% sat iPTH 802 - prev on sensipar only - just added phoslo 2 ac as P up on May labs  Assessment/Plan: 1. symptomatic cholelithiasis - surgery has seen - planning on lap chole today  2. ESRD - normally MWF via PC even though AVF seems ready (placed 2/23) - only had had 30 min Monday as OP -had full treatment Tuesday off schedule- planning for today and Sat then Mon to get back on schedule 3. Hypertension/volume - CXR neg for volume - pt has ONLY been taking norvasc. Clonidine and hydralazine were stopped 4/5 by Dr. Jimmy Footman -volume can come down some  Plan continue coreg. D/c hydralazine and change clonidine to prn- challenge with next HD today- BP is fine 4. Anemia - hgb down some this am - follow - resume ESA- will follow 5. Metabolic bone disease - Continue binders(phoslo)/sensipar/calcitriol ^ to 1.0- HD off schedule-  6. Nutrition - on CL -added vits 7. Hx CABG s/p ICD 8. Hyponatremia- sodium 123 ?  Challenge volume     Ivah Girardot A    Labs: Basic Metabolic Panel:  Recent Labs Lab 06/14/15 2130 06/15/15 0017 06/16/15 1211 06/17/15 0332  NA  --  130* 123* 123*  K  --  4.3 4.9 4.7  CL  --  95* 88* 88*  CO2  --  24 26 24   GLUCOSE  --  139* 127* 66  BUN  --  60* 27* 31*  CREATININE  --  9.42*  5.65* 6.06*  CALCIUM  --  7.8* 8.3* 7.7*  PHOS 6.1*  --  4.6 4.4   Liver Function Tests:  Recent Labs Lab 06/14/15 1355 06/16/15 1211 06/17/15 0332  AST 11*  --   --   ALT 18  --   --   ALKPHOS 66  --   --   BILITOT 0.6  --   --   PROT 9.2*  --   --   ALBUMIN 3.2* 2.9* 2.6*    Recent Labs Lab 06/14/15 1816  LIPASE 195*   No results for input(s): AMMONIA in the last 168 hours. CBC:  Recent Labs Lab 06/14/15 1355 06/15/15 0017 06/17/15 0332  WBC 6.0 6.3 5.8  HGB 12.0* 10.7* 9.9*  HCT 35.8* 33.2* 29.1*  MCV 87.3 89.2 86.1  PLT 130* 127* 120*   Cardiac Enzymes:  Recent Labs Lab 06/14/15 1355 06/14/15 1817 06/14/15 2130 06/15/15 0017  TROPONINI 0.03 0.04* 0.04* 0.04*   CBG:  Recent Labs Lab 06/16/15 1611 06/16/15 2101 06/16/15 2143 06/17/15 0150 06/17/15 0612  GLUCAP 193* 58* 100* 75 85    Iron Studies: No results for  input(s): IRON, TIBC, TRANSFERRIN, FERRITIN in the last 72 hours. Studies/Results: Nm Myocar Multi W/spect W/wall Motion / Ef  06/16/2015   Defect 1: There is a medium defect of mild severity present in the basal inferior and mid inferior location.  Findings consistent with prior myocardial infarction.  The left ventricular ejection fraction is mildly decreased (45-54%).  Nuclear stress EF: 51%.    Medications: Infusions:    Scheduled Medications: . amLODipine  10 mg Oral Daily  . calcitRIOL  1 mcg Oral Q M,W,F-HD  . calcium acetate  1,334 mg Oral TID WC  . carvedilol  6.25 mg Oral BID WC  . cinacalcet  30 mg Oral Q supper  . gi cocktail  30 mL Oral Once  . heparin  5,000 Units Subcutaneous Q8H  . insulin aspart  0-9 Units Subcutaneous TID WC  . isosorbide mononitrate  60 mg Oral Daily  . multivitamin  1 tablet Oral QHS    have reviewed scheduled and prn medications.  Physical Exam: General: sitting up in chair- NAD Heart: RRR Lungs: clear Abdomen: soft, non tender Extremities: no overt peripheral edema Dialysis  Access: tight sided PC and right AVF good thrill and bruit     06/17/2015,8:01 AM  LOS: 2 days

## 2015-06-18 ENCOUNTER — Inpatient Hospital Stay (HOSPITAL_COMMUNITY): Payer: Medicaid Other

## 2015-06-18 ENCOUNTER — Encounter (HOSPITAL_COMMUNITY): Payer: Self-pay | Admitting: Surgery

## 2015-06-18 DIAGNOSIS — N184 Chronic kidney disease, stage 4 (severe): Secondary | ICD-10-CM

## 2015-06-18 DIAGNOSIS — N186 End stage renal disease: Secondary | ICD-10-CM | POA: Insufficient documentation

## 2015-06-18 LAB — CBC
HEMATOCRIT: 31.6 % — AB (ref 39.0–52.0)
Hemoglobin: 10.6 g/dL — ABNORMAL LOW (ref 13.0–17.0)
MCH: 29.3 pg (ref 26.0–34.0)
MCHC: 33.5 g/dL (ref 30.0–36.0)
MCV: 87.3 fL (ref 78.0–100.0)
PLATELETS: 122 10*3/uL — AB (ref 150–400)
RBC: 3.62 MIL/uL — AB (ref 4.22–5.81)
RDW: 12.7 % (ref 11.5–15.5)
WBC: 7.5 10*3/uL (ref 4.0–10.5)

## 2015-06-18 LAB — GLUCOSE, CAPILLARY
GLUCOSE-CAPILLARY: 137 mg/dL — AB (ref 65–99)
Glucose-Capillary: 88 mg/dL (ref 65–99)
Glucose-Capillary: 141 mg/dL — ABNORMAL HIGH (ref 65–99)
Glucose-Capillary: 106 mg/dL — ABNORMAL HIGH (ref 65–99)

## 2015-06-18 LAB — RENAL FUNCTION PANEL
ALBUMIN: 2.9 g/dL — AB (ref 3.5–5.0)
Anion gap: 8 (ref 5–15)
BUN: 17 mg/dL (ref 6–20)
CHLORIDE: 97 mmol/L — AB (ref 101–111)
CO2: 25 mmol/L (ref 22–32)
Calcium: 7.8 mg/dL — ABNORMAL LOW (ref 8.9–10.3)
Creatinine, Ser: 5.09 mg/dL — ABNORMAL HIGH (ref 0.61–1.24)
GFR calc Af Amer: 13 mL/min — ABNORMAL LOW (ref >60)
GFR calc non Af Amer: 11 mL/min — ABNORMAL LOW (ref >60)
GLUCOSE: 101 mg/dL — AB (ref 65–99)
PHOSPHORUS: 5.1 mg/dL — AB (ref 2.5–4.6)
POTASSIUM: 4.9 mmol/L (ref 3.5–5.1)
Sodium: 130 mmol/L — ABNORMAL LOW (ref 135–145)

## 2015-06-18 MED ORDER — OXYCODONE HCL 5 MG PO TABS
5.0000 mg | ORAL_TABLET | Freq: Four times a day (QID) | ORAL | Status: DC | PRN
  Administered 2015-06-18: 7.5 mg via ORAL
  Filled 2015-06-18: qty 2

## 2015-06-18 MED ORDER — HYDROMORPHONE HCL 2 MG PO TABS
1.0000 mg | ORAL_TABLET | Freq: Four times a day (QID) | ORAL | Status: DC | PRN
Start: 2015-06-18 — End: 2015-06-18
  Administered 2015-06-18: 1 mg via ORAL
  Filled 2015-06-18: qty 1

## 2015-06-18 MED ORDER — POLYETHYLENE GLYCOL 3350 17 G PO PACK
17.0000 g | PACK | Freq: Every day | ORAL | Status: DC
  Administered 2015-06-18 – 2015-06-20 (×3): 17 g via ORAL
  Filled 2015-06-18 (×5): qty 1

## 2015-06-18 MED ORDER — DOCUSATE SODIUM 100 MG PO CAPS
100.0000 mg | ORAL_CAPSULE | Freq: Two times a day (BID) | ORAL | Status: DC
Start: 2015-06-18 — End: 2015-06-21
  Administered 2015-06-18 – 2015-06-20 (×4): 100 mg via ORAL
  Filled 2015-06-18 (×6): qty 1

## 2015-06-18 MED ORDER — DARBEPOETIN ALFA 100 MCG/0.5ML IJ SOSY: 100.0000 ug | PREFILLED_SYRINGE | INTRAMUSCULAR | Status: DC

## 2015-06-18 MED ORDER — FENTANYL CITRATE (PF) 100 MCG/2ML IJ SOLN: 12.5000 ug | INTRAMUSCULAR | Status: DC | PRN

## 2015-06-18 NOTE — Progress Notes (Addendum)
FPTS Interim Progress Note  S: RN paged to inform that patient with significant abdominal distension and discomfort.   Patient reports that his abdomen feels very bloated and notes generalized discomfort.  He reports that he has NOT had flatus or BM since surgery.  No UOP but also is ESRD.  No vomiting.  Minimal nausea, has been tolerating diet but notes that now his appetite is decreasing because of abdominal discomfort.  O: BP 128/70 mmHg  Pulse 78  Temp(Src) 98.8 F (37.1 C) (Oral)  Resp 14  Wt 186 lb 6.4 oz (84.55 kg)  SpO2 98%  Gen: awake, sitting up at bedside GI: distended appearing abdomen, palpable liver, soft, generalized TTP, though no wincing/indications of pain with deep palpation with stethoscope.  Patient notes increased pain near incision sites.  Incision sites clean and no evidence of infection. +hypoactive BS. No peritoneal signs  A/P: Eric [name] is a 60 y.o. s/p cholecystectomy 5/25.  No flatus/ BM so far.  No vomiting but having abdominal pain and distension.  Of note, also has h/o cirrhosis and ESRD.  No obvious fluid wave on exam.  No pain illicited with deep palpation w/ stethoscope.  Has risk of ileus given post-op. - Stat abdominal xray - NPO for now - Has percocet for pain, cannot use NSAIDs in the setting of renal disease. - Monitor - Will consider NGT - If worsening, may need to call surgery  Janora Norlander, DO 06/18/2015, 5:52 PM PGY-2, [facility] pager [phone]   Update 8:01 PM  Dg Abd Portable 2v  06/18/2015  CLINICAL DATA:  Abdominal distension EXAM: PORTABLE ABDOMEN - 2 VIEW COMPARISON:  CT abdomen 06/15/2015 FINDINGS: The bowel gas pattern is normal. There is no evidence of free air. No radio-opaque calculi or other significant radiographic abnormality is seen. IMPRESSION: Negative. Electronically Signed   By: Kathreen Devoid   On: 06/18/2015 18:27    Xray without evidence of ileus or free air.  Recommend NPO for now.   May advance diet to clear liquids as tolerated.  Continue current management.  Monitor for BM/ Flatus/ worsening abdominal pain.  May need to call surgery if continued distention and abdominal pain but suspect that patient will improve on his own.  Discussed with RN.    Kandi Brusseau M. Lajuana Ripple, DO PGY-2, [facility]  Update: 12:20 AM RN calls to report that patient has nausea and vomiting.  His vomiting is not controlled by Zofran 4mg , asking to increase dose.  Dose increased to 8mg  IV.  Pain medication changed from Oxy PO back to Fentanyl IV.  Would use SPARINGLY, esp if this is truly an ileus.  NGT order placed if patient continues to be refractory to anti-emetics.  No radiographic evidence of abnormalities at this time.  Would consider call to surgery if continued symptoms in spite of current therapies.    Jesiah Yerby M. Lajuana Ripple, DO PGY-2, [facility]

## 2015-06-18 NOTE — Progress Notes (Signed)
Subjective:  Doing well.  Denies any chest pain or shortness of breath tolerated laparoscopic cholecystectomy yesterday.  No active cardiac issues.  He complains of vague abdominal discomfort.   No flatus.  Objective:  Vital Signs in the last 24 hours: Temp:  [96.9 F (36.1 C)-98.5 F (36.9 C)] 98.5 F (36.9 C) (05/26 0339) Pulse Rate:  [73-85] 85 (05/26 0339) Resp:  [10-24] 18 (05/26 0339) BP: (117-162)/(71-109) 138/71 mmHg (05/26 0339) SpO2:  [93 %-100 %] 94 % (05/26 0339) Weight:  [84.5 kg (186 lb 4.6 oz)-84.55 kg (186 lb 6.4 oz)] 84.55 kg (186 lb 6.4 oz) (05/26 0349)  Intake/Output from previous day: 05/25 0701 - 05/26 0700 In: 790 [P.O.:240; I.V.:550] Out: 1418 [Blood:20] Intake/Output from this shift:    Physical Exam: Neck: no adenopathy, no carotid bruit, no JVD and supple, symmetrical, trachea midline Lungs: clear to auscultation bilaterally Heart: regular rate and rhythm, S1, S2 normal and soft systolic murmur noted Abdomen: soft, mildly distended.  Bowel sounds absent.  No guarding Extremities: no clubbing, cyanosis .  Trace edema noted  Lab Results:  Recent Labs  06/17/15 0845 06/18/15 0309  WBC 5.0 7.5  HGB 10.4* 10.6*  PLT 131* 122*    Recent Labs  06/17/15 0845 06/18/15 0309  NA 121* 130*  K 4.9 4.9  CL 89* 97*  CO2 25 25  GLUCOSE 81 101*  BUN 34* 17  CREATININE 6.18* 5.09*   No results for input(s): TROPONINI in the last 72 hours.  Invalid input(s): CK, MB Hepatic Function Panel  Recent Labs  06/18/15 0309  ALBUMIN 2.9*   No results for input(s): CHOL in the last 72 hours. No results for input(s): PROTIME in the last 72 hours.  Imaging: Imaging results have been reviewed and Nm Myocar Multi W/spect W/wall Motion / Ef  06/16/2015   Defect 1: There is a medium defect of mild severity present in the basal inferior and mid inferior location.  Findings consistent with prior myocardial infarction.  The left ventricular ejection fraction  is mildly decreased (45-54%).  Nuclear stress EF: 51%.     Cardiac Studies:  Assessment/Plan:  Symptomatic cholelithiasis/mild acute pancreatitis status post laparoscopic cholecystectomy.  Postop day one, doing well Stable angina. Negative nuclear stress test Coronary artery disease status post CABG History of V. Fib cardiac arrest in the past, status post ICD. Hypertension. Diabetes mellitus. Hyperlipidemia. History of hepatitis C Cirrhosis of liver. End-stage renal disease on hemodialysis. Seizure disorder. Anemia of chronic disease. History of chronic hydrocephalus with dilated third and lateral ventricles. Resolving hyponatremia Plan Continue present management. I will sign off.  Please call if needed  LOS: 3 days    Charolette Forward 06/18/2015, 9:25 AM

## 2015-06-18 NOTE — Progress Notes (Addendum)
Bladder scan showed 315ml. MD notified. Will continue to monitor pt closely. No new orders at this time.

## 2015-06-18 NOTE — Evaluation (Signed)
Physical Therapy Evaluation Patient Details Name: Eric [name] MRN: [id number] DOB: [birth year] Today's Date: 06/18/2015   History of Present Illness  Pt admitted with CP and symptomatic cholelithiasis. s/p lap chole 5/25. PMH: ESRD, metabolic bone disease,  CABG, ICD, DM, Hep C, liver cirrhosis, seizure.  Clinical Impression  Pt admitted with above diagnosis. Pt currently with functional limitations due to the deficits listed below (see PT Problem List). Pt was able to ambulate with RW with good overalll safety. STruggled a little on the steps however wife feels fairly confident with assisting pt.  Discussed that PT will try to return and pt practice again prior to d/c home.  Will follow acutely.   Pt will benefit from skilled PT to increase their independence and safety with mobility to allow discharge to the venue listed below.    Follow Up Recommendations Supervision/Assistance - 24 hour;No PT follow up    Equipment Recommendations  Rolling walker with 5" wheels    Recommendations for Other Services       Precautions / Restrictions Precautions Precautions: Fall Restrictions Weight Bearing Restrictions: No      Mobility  Bed Mobility               General bed mobility comments: in chair  Transfers Overall transfer level: Modified independent Equipment used: Rolling walker (2 wheeled)                Ambulation/Gait Ambulation/Gait assistance: Min guard;Supervision Ambulation Distance (Feet): 450 Feet Assistive device: Rolling walker (2 wheeled) Gait Pattern/deviations: Step-through pattern;Decreased stride length   Gait velocity interpretation: Below normal speed for age/gender General Gait Details: Pt wasw able to ambulate with RW with good overall safety. Wife will provide 24 hour care prn. No LOB with min challenges to balance.   Stairs Stairs: Yes Stairs assistance: Min assist Stair Management: Step to pattern;No rails;Forwards (with hand on wifes'  shoulder which is how pt did PTA) Number of Stairs: 4 General stair comments: Pt initially with LOB posterior on steps needing min assist by PT.  Once he demonstrated technique that he and wife do at home, the two of them were able to complete the steps.  Pt did report that he got a little dizzy when he went up and down steps with his abdomen having incr pain.  Wife and pt feel comfortable overall with steps but did discuss that PT would try and practice steps again if pt still here the first of next week.   Wheelchair Mobility    Modified Rankin (Stroke Patients Only)       Balance Overall balance assessment: Needs assistance;History of Falls Sitting-balance support: No upper extremity supported;Feet supported Sitting balance-Leahy Scale: Fair   Postural control: Posterior lean Standing balance support: Bilateral upper extremity supported;During functional activity Standing balance-Leahy Scale: Poor Standing balance comment: relies on RW.                             Pertinent Vitals/Pain Pain Assessment: 0-10 Pain Score: 6  Pain Location: abdomen Pain Descriptors / Indicators: Aching;Sore Pain Intervention(s): Limited activity within patient's tolerance;Monitored during session;Premedicated before session;Repositioned  VSS    Home Living Family/patient expects to be discharged to:: Private residence Living Arrangements: Spouse/significant other;Children Available Help at Discharge: Family;Available 24 hours/day Type of Home: House Home Access: Stairs to enter Entrance Stairs-Rails: None Entrance Stairs-Number of Steps: 4 Home Layout: One level Home Equipment: Walker - 2 wheels;Shower seat  Prior Function Level of Independence: Needs assistance   Gait / Transfers Assistance Needed: Independent with gait  ADL's / Homemaking Assistance Needed: Wife assists with bathing and meal prep per patient  Comments: pt refuses use of shower seat, hx of falls      Hand Dominance   Dominant Hand: Right    Extremity/Trunk Assessment   Upper Extremity Assessment: Defer to OT evaluation           Lower Extremity Assessment: Generalized weakness      Cervical / Trunk Assessment: Normal  Communication   Communication: No difficulties  Cognition Arousal/Alertness: Lethargic;Suspect due to medications Behavior During Therapy: Flat affect Overall Cognitive Status: Within Functional Limits for tasks assessed (family answering questions frequently for pt)                      General Comments      Exercises General Exercises - Lower Extremity Ankle Circles/Pumps: AROM;Both;10 reps;Seated Long Arc Quad: AROM;Both;10 reps;Seated      Assessment/Plan    PT Assessment Patient needs continued PT services  PT Diagnosis Generalized weakness   PT Problem List Decreased balance;Decreased mobility;Decreased activity tolerance;Decreased knowledge of use of DME;Decreased safety awareness;Decreased knowledge of precautions  PT Treatment Interventions DME instruction;Gait training;Functional mobility training;Therapeutic exercise;Therapeutic activities;Stair training;Balance training;Patient/family education   PT Goals (Current goals can be found in the Care Plan section) Acute Rehab PT Goals Patient Stated Goal: go home PT Goal Formulation: With patient Time For Goal Achievement: 07/02/15 Potential to Achieve Goals: Good    Frequency Min 3X/week   Barriers to discharge        Co-evaluation               End of Session Equipment Utilized During Treatment: Gait belt Activity Tolerance: Patient limited by fatigue Patient left: in chair;with call bell/phone within reach;with family/visitor present Nurse Communication: Mobility status         Time: [id number] PT Time Calculation (min) (ACUTE ONLY): 11 min   Charges:   PT Evaluation $PT Eval Moderate Complexity: 1 Procedure     PT G CodesIrwin Brakeman  F 2015/07/12, 2:33 PM [facility] Acute Rehabilitation [phone] (pager)

## 2015-06-18 NOTE — Progress Notes (Signed)
Patient ID: Eric [name], male   DOB: [birth year], 60 y.o.   MRN: [id number]     [facility]      [address] [place], [place] [address]    Phone: [phone] FAX: [phone]     Subjective: No n/v.  Sore.  No flatus.  Tolerating POs. Afebrile.   Objective:  Vital signs:  Filed Vitals:   06/17/15 1640 06/17/15 2118 06/18/15 0339 06/18/15 0349  BP: 138/80 117/75 138/71   Pulse: 79 84 85   Temp: 97.8 F (36.6 C) 98.4 F (36.9 C) 98.5 F (36.9 C)   TempSrc:  Oral Oral   Resp: '12 18 18   '$ Weight:    84.55 kg (186 lb 6.4 oz)  SpO2: 93% 98% 94%     Last BM Date: 06/17/15  Intake/Output   Yesterday:  05/25 0701 - 05/26 0700 In: 790 [P.O.:240; I.V.:550] Out: 1418 [Blood:20] This shift:    I/O last 3 completed shifts: In: 790 [P.O.:240; I.V.:550] Out: 1418 [Other:1398; Blood:20]    Physical Exam: General: Pt awake/alert/oriented x4 in no acute distress Abdomen: +BS, abdomen is soft, tender over incisions.  Incisions are c/d/i.     Problem List:   Active Problems:   Chest pain   Numbness   RUQ abdominal pain   Gallbladder calculus   Pancreatitis   Gallstones    Results:   Labs: Results for orders placed or performed during the hospital encounter of 06/14/15 (from the past 48 hour(s))  Renal function panel     Status: Abnormal   Collection Time: 06/16/15 12:11 PM  Result Value Ref Range   Sodium 123 (L) 135 - 145 mmol/L    Comment: DELTA CHECK NOTED   Potassium 4.9 3.5 - 5.1 mmol/L   Chloride 88 (L) 101 - 111 mmol/L   CO2 26 22 - 32 mmol/L   Glucose, Bld 127 (H) 65 - 99 mg/dL   BUN 27 (H) 6 - 20 mg/dL   Creatinine, Ser 5.65 (H) 0.61 - 1.24 mg/dL    Comment: DELTA CHECK NOTED   Calcium 8.3 (L) 8.9 - 10.3 mg/dL   Phosphorus 4.6 2.5 - 4.6 mg/dL   Albumin 2.9 (L) 3.5 - 5.0 g/dL   GFR calc non Af Amer 10 (L) >60 mL/min   GFR calc Af Amer 11 (L) >60 mL/min    Comment: (NOTE) The eGFR has been  calculated using the CKD EPI equation. This calculation has not been validated in all clinical situations. eGFR's persistently <60 mL/min signify possible Chronic Kidney Disease.    Anion gap 9 5 - 15  Glucose, capillary     Status: Abnormal   Collection Time: 06/16/15  4:11 PM  Result Value Ref Range   Glucose-Capillary 193 (H) 65 - 99 mg/dL  Glucose, capillary     Status: Abnormal   Collection Time: 06/16/15  9:01 PM  Result Value Ref Range   Glucose-Capillary 58 (L) 65 - 99 mg/dL   Comment 1 Notify RN    Comment 2 Document in Chart   Glucose, capillary     Status: Abnormal   Collection Time: 06/16/15  9:43 PM  Result Value Ref Range   Glucose-Capillary 100 (H) 65 - 99 mg/dL  Glucose, capillary     Status: None   Collection Time: 06/17/15  1:50 AM  Result Value Ref Range   Glucose-Capillary 75 65 - 99 mg/dL   Comment 1 Notify RN    Comment 2  Document in Chart   Renal function panel     Status: Abnormal   Collection Time: 06/17/15  3:32 AM  Result Value Ref Range   Sodium 123 (L) 135 - 145 mmol/L   Potassium 4.7 3.5 - 5.1 mmol/L   Chloride 88 (L) 101 - 111 mmol/L   CO2 24 22 - 32 mmol/L   Glucose, Bld 66 65 - 99 mg/dL   BUN 31 (H) 6 - 20 mg/dL   Creatinine, Ser 6.06 (H) 0.61 - 1.24 mg/dL   Calcium 7.7 (L) 8.9 - 10.3 mg/dL   Phosphorus 4.4 2.5 - 4.6 mg/dL   Albumin 2.6 (L) 3.5 - 5.0 g/dL   GFR calc non Af Amer 9 (L) >60 mL/min   GFR calc Af Amer 10 (L) >60 mL/min    Comment: (NOTE) The eGFR has been calculated using the CKD EPI equation. This calculation has not been validated in all clinical situations. eGFR's persistently <60 mL/min signify possible Chronic Kidney Disease.    Anion gap 11 5 - 15  CBC     Status: Abnormal   Collection Time: 06/17/15  3:32 AM  Result Value Ref Range   WBC 5.8 4.0 - 10.5 K/uL   RBC 3.38 (L) 4.22 - 5.81 MIL/uL   Hemoglobin 9.9 (L) 13.0 - 17.0 g/dL   HCT 29.1 (L) 39.0 - 52.0 %   MCV 86.1 78.0 - 100.0 fL   MCH 29.3 26.0 - 34.0 pg    MCHC 34.0 30.0 - 36.0 g/dL   RDW 12.4 11.5 - 15.5 %   Platelets 120 (L) 150 - 400 K/uL  Glucose, capillary     Status: None   Collection Time: 06/17/15  6:12 AM  Result Value Ref Range   Glucose-Capillary 85 65 - 99 mg/dL   Comment 1 Notify RN    Comment 2 Document in Chart   Renal function panel     Status: Abnormal   Collection Time: 06/17/15  8:45 AM  Result Value Ref Range   Sodium 121 (L) 135 - 145 mmol/L   Potassium 4.9 3.5 - 5.1 mmol/L   Chloride 89 (L) 101 - 111 mmol/L   CO2 25 22 - 32 mmol/L   Glucose, Bld 81 65 - 99 mg/dL   BUN 34 (H) 6 - 20 mg/dL   Creatinine, Ser 6.18 (H) 0.61 - 1.24 mg/dL   Calcium 7.7 (L) 8.9 - 10.3 mg/dL   Phosphorus 4.4 2.5 - 4.6 mg/dL   Albumin 2.8 (L) 3.5 - 5.0 g/dL   GFR calc non Af Amer 9 (L) >60 mL/min   GFR calc Af Amer 10 (L) >60 mL/min    Comment: (NOTE) The eGFR has been calculated using the CKD EPI equation. This calculation has not been validated in all clinical situations. eGFR's persistently <60 mL/min signify possible Chronic Kidney Disease.    Anion gap 7 5 - 15  CBC     Status: Abnormal   Collection Time: 06/17/15  8:45 AM  Result Value Ref Range   WBC 5.0 4.0 - 10.5 K/uL   RBC 3.58 (L) 4.22 - 5.81 MIL/uL   Hemoglobin 10.4 (L) 13.0 - 17.0 g/dL   HCT 30.3 (L) 39.0 - 52.0 %   MCV 84.6 78.0 - 100.0 fL   MCH 29.1 26.0 - 34.0 pg   MCHC 34.3 30.0 - 36.0 g/dL   RDW 12.3 11.5 - 15.5 %   Platelets 131 (L) 150 - 400 K/uL  Hepatitis B surface antigen  Status: None   Collection Time: 06/17/15  8:45 AM  Result Value Ref Range   Hepatitis B Surface Ag Negative Negative    Comment: (NOTE) Stat results called to [place] 06/17/2015 at 17:30 Performed At: [facility] [address] [place], [place] [id number] Lindon Romp MD MW:[id number]   Glucose, capillary     Status: None   Collection Time: 06/17/15 12:43 PM  Result Value Ref Range   Glucose-Capillary 74 65 - 99 mg/dL   Comment 1 Notify RN    Comment 2  Document in Chart   Glucose, capillary     Status: None   Collection Time: 06/17/15  2:59 PM  Result Value Ref Range   Glucose-Capillary 77 65 - 99 mg/dL  Glucose, capillary     Status: Abnormal   Collection Time: 06/17/15  9:06 PM  Result Value Ref Range   Glucose-Capillary 118 (H) 65 - 99 mg/dL   Comment 1 Notify RN    Comment 2 Document in Chart   CBC     Status: Abnormal   Collection Time: 06/18/15  3:09 AM  Result Value Ref Range   WBC 7.5 4.0 - 10.5 K/uL   RBC 3.62 (L) 4.22 - 5.81 MIL/uL   Hemoglobin 10.6 (L) 13.0 - 17.0 g/dL   HCT 31.6 (L) 39.0 - 52.0 %   MCV 87.3 78.0 - 100.0 fL   MCH 29.3 26.0 - 34.0 pg   MCHC 33.5 30.0 - 36.0 g/dL   RDW 12.7 11.5 - 15.5 %   Platelets 122 (L) 150 - 400 K/uL  Renal function panel     Status: Abnormal   Collection Time: 06/18/15  3:09 AM  Result Value Ref Range   Sodium 130 (L) 135 - 145 mmol/L    Comment: DELTA CHECK NOTED   Potassium 4.9 3.5 - 5.1 mmol/L   Chloride 97 (L) 101 - 111 mmol/L   CO2 25 22 - 32 mmol/L   Glucose, Bld 101 (H) 65 - 99 mg/dL   BUN 17 6 - 20 mg/dL   Creatinine, Ser 5.09 (H) 0.61 - 1.24 mg/dL   Calcium 7.8 (L) 8.9 - 10.3 mg/dL   Phosphorus 5.1 (H) 2.5 - 4.6 mg/dL   Albumin 2.9 (L) 3.5 - 5.0 g/dL   GFR calc non Af Amer 11 (L) >60 mL/min   GFR calc Af Amer 13 (L) >60 mL/min    Comment: (NOTE) The eGFR has been calculated using the CKD EPI equation. This calculation has not been validated in all clinical situations. eGFR's persistently <60 mL/min signify possible Chronic Kidney Disease.    Anion gap 8 5 - 15  Glucose, capillary     Status: None   Collection Time: 06/18/15  6:14 AM  Result Value Ref Range   Glucose-Capillary 88 65 - 99 mg/dL    Imaging / Studies: Nm Myocar Multi W/spect W/wall Motion / Ef  06/16/2015   Defect 1: There is a medium defect of mild severity present in the basal inferior and mid inferior location.  Findings consistent with prior myocardial infarction.  The left  ventricular ejection fraction is mildly decreased (45-54%).  Nuclear stress EF: 51%.     Medications / Allergies:  Scheduled Meds: . amLODipine  10 mg Oral Daily  . calcitRIOL  1 mcg Oral Q M,W,F-HD  . calcium acetate  1,334 mg Oral TID WC  . carvedilol  6.25 mg Oral BID WC  . cinacalcet  30 mg Oral Q supper  .  darbepoetin (ARANESP) injection - DIALYSIS  100 mcg Intravenous Q Thu-HD  . docusate sodium  100 mg Oral BID  . gi cocktail  30 mL Oral Once  . heparin  5,000 Units Subcutaneous Q8H  . insulin aspart  0-9 Units Subcutaneous TID WC  . isosorbide mononitrate  60 mg Oral Daily  . multivitamin  1 tablet Oral QHS  . polyethylene glycol  17 g Oral Daily   Continuous Infusions: . sodium chloride Stopped (06/17/15 1453)   PRN Meds:.acetaminophen, cloNIDine, fentaNYL (SUBLIMAZE) injection, gi cocktail, ondansetron (ZOFRAN) IV, oxyCODONE-acetaminophen  Antibiotics: Anti-infectives    Start     Dose/Rate Route Frequency Ordered Stop   06/17/15 1500  ceFAZolin (ANCEF) IVPB 2g/100 mL premix  Status:  Discontinued    Comments:  Pharmacy may adjust dosing strength, interval, or rate of medication as needed for optimal therapy for the patient  Send with patient on call to the OR.  Anesthesia to complete antibiotic administration <5mn [facility]   2 g 200 mL/hr over 30 Minutes Intravenous On call to O.R. 06/17/15 1019 06/18/15 0837        Assessment/Plan POD#1 laparoscopic cholecystectomy--Dr. BNinfa Linden-tolerating POs, encourage PO pain meds for pain control, mobilize, add colace and miralax -stable for DC from a surgical standpoint, follow up arranged  VTE prophylaxis-scd, heparin FEN-no issues Dispo-per primary team   EErby Pian [facility] Pager [phone](7A-4:30P) For consults and floor pages call [phone](7A-4:30P)  06/18/2015 8:38 AM

## 2015-06-18 NOTE — Evaluation (Signed)
Occupational Therapy Evaluation and Discharge Patient Details Name: Eric [name] MRN: [id number] DOB: [birth year] Today's Date: 06/18/2015    History of Present Illness Pt admitted with CP and symptomatic cholelithiasis. s/p lap chole 5/25. PMH: ESRD, metabolic bone disease,  CABG, ICD, DM, Hep C, liver cirrhosis, seizure.   Clinical Impression   Pt was assisted for tub transfer and supervised for showering, otherwise independent in mobility and ADL. His family reports a hx of falls, but pt is resistant to using RW. Pt mobilizing at a modified independent level with RW in room/bathroom. Able to perform self care at his baseline. Pt has an attentive and supportive family. No further OT needs.    Follow Up Recommendations  No OT follow up    Equipment Recommendations  None recommended by OT    Recommendations for Other Services       Precautions / Restrictions Restrictions Weight Bearing Restrictions: No      Mobility Bed Mobility               General bed mobility comments: in chair  Transfers Overall transfer level: Modified independent Equipment used: Rolling walker (2 wheeled)                  Balance                                            ADL Overall ADL's : At baseline                                       General ADL Comments: able to cross foot over opposite knee to reach feet, recommended pt use shower seat while recovering     Vision     Perception     Praxis      Pertinent Vitals/Pain Pain Assessment: 0-10 Pain Score: 6  Pain Location: abdomen Pain Descriptors / Indicators: Sore Pain Intervention(s): Monitored during session     Hand Dominance Right   Extremity/Trunk Assessment Upper Extremity Assessment Upper Extremity Assessment: Overall WFL for tasks assessed   Lower Extremity Assessment Lower Extremity Assessment: Defer to PT evaluation       Communication  Communication Communication: No difficulties   Cognition Arousal/Alertness: Awake/alert Behavior During Therapy: WFL for tasks assessed/performed Overall Cognitive Status: Within Functional Limits for tasks assessed (family answering questions frequently for pt)                     General Comments       Exercises       Shoulder Instructions      Home Living Family/patient expects to be discharged to:: Private residence Living Arrangements: Spouse/significant other;Children Available Help at Discharge: Family;Available 24 hours/day Type of Home: House Home Access: Stairs to enter [employer] of Steps: 4 Entrance Stairs-Rails: None Home Layout: One level     Bathroom Shower/Tub: Teacher, early years/pre: Standard     Home Equipment: Environmental consultant - 2 wheels;Shower seat          Prior Functioning/Environment Level of Independence: Needs assistance  Gait / Transfers Assistance Needed: Independent with gait ADL's / Homemaking Assistance Needed: Wife assists with bathing and meal prep per patient   Comments: pt refuses use of shower seat, hx of falls  OT Diagnosis: Generalized weakness;Acute pain   OT Problem List:     OT Treatment/Interventions:      OT Goals(Current goals can be found in the care plan section) Acute Rehab OT Goals Patient Stated Goal: go home  OT Frequency:     Barriers to D/C:            Co-evaluation              End of Session Equipment Utilized During Treatment: Rolling walker  Activity Tolerance: Patient tolerated treatment well Patient left: in chair;with call bell/phone within reach;with family/visitor present   Time: 1000-1014 OT Time Calculation (min): 14 min Charges:  OT General Charges $OT Visit: 1 Procedure OT Evaluation $OT Eval Low Complexity: 1 Procedure G-Codes:    Malka So 06/18/2015, 10:29 AM  [phone]

## 2015-06-18 NOTE — Progress Notes (Signed)
Pt has been ambulating independently in hall today with walker assist.  He ambulated with me at this time 520ft.  His abdomen is distended and uncomfortable. Ambulation encouraged.

## 2015-06-18 NOTE — Discharge Summary (Signed)
[facility] Discharge Summary  Patient name: Eric [name] Medical record number: [id number] Date of birth: [birth year] Age: 61 y.o. Gender: male Date of Admission: 06/14/2015  Date of Discharge: 07/01/15 Admitting Physician: Eric La, MD  Primary Care Provider: [name], NP Consultants: Cardiology and General Surgery   Indication for Hospitalization: Acute Coronary Syndrome Rule out, Gallbladder pancreatitis, and cholecystectomy, post-op ileus due to mall bowel umbilical hernia s/p repair  Discharge Diagnoses/Problem List:  Patient Active Problem List   Diagnosis Date Noted  . Encounter for imaging study to confirm nasogastric (NG) tube placement   . Encounter for nasogastric tube placement   . Ileus, postoperative   . Impaired nasal gastric feeding tube   . Impaired nasogastric feeding tube   . Abdominal distension   . Ileus ([place])   . ESRD (end stage renal disease) ([place])   . Gallstones   . Gallbladder calculus 06/15/2015  . Pancreatitis   . Numbness   . RUQ abdominal pain   . CHF exacerbation ([place]) 03/10/2015  . Heart failure, acute on chronic, systolic and diastolic ([name]) [id number]  . Acute on chronic congestive heart failure ([place])   . Diabetes mellitus with complication ([place])   . Acute-on-chronic kidney injury ([place])   . CHF (congestive heart failure) ([place]) 08/03/2014  . Hypertensive urgency 08/03/2014  . Diabetes mellitus type 2, controlled ([place]) 08/03/2014  . Obesity (BMI 30-39.9) 08/03/2014  . Bilateral leg pain 08/03/2014  . Acute on chronic combined systolic and diastolic CHF (congestive heart failure) ([place]) 07/02/2014  . Acute renal failure superimposed on stage 3 chronic kidney disease ([place]) 07/01/2014  . Acute renal failure ([place]) 06/29/2014  . CKD (chronic kidney disease) stage 3, GFR 30-59 ml/min 06/29/2014  . DM (diabetes mellitus), type 2 with renal complications ([place]) 123456  . Rash and nonspecific skin eruption  06/29/2014  . AICD (automatic cardioverter/defibrillator) present 06/29/2014  . Essential hypertension, benign 06/29/2014  . Hepatic cirrhosis ([place]) 11/28/2013  . Cardiomyopathy, ischemic 11/28/2013  . Hepatitis C antibody test positive 11/28/2013  . Cirrhosis ([place])   . Pain in the chest   . Type 2 diabetes mellitus with hyperglycemia ([place])   . Chest pain 11/26/2013  . Diabetes mellitus with hypoglycemia ([place]) 04/29/2013  . HCAP (healthcare-associated pneumonia) 04/28/2013  . Fever 04/28/2013  . Acute respiratory failure ([place]) 04/28/2013  . DM type 2 (diabetes mellitus, type 2) ([place]) 04/28/2013  . Pneumonia 04/28/2013  . Hypertension   . Hyperlipemia   . Ventricular fibrillation ([place]) 03/08/2013  . Cardiac arrest ([place]) 03/07/2013  . Seizure ([place]) 03/07/2013  . Acute respiratory failure with hypoxia ([place]) 03/07/2013     Disposition: Home  Discharge Condition: improved, stable   Discharge Exam:  General: NAD, sitting  Cardiovascular: RRR, no m/r/g Respiratory: On room air, CTAB, no increased WOB  Abdomen: softer, + BS, no tenderness to palpation, no guarding or rebound/peritoneal signs. Surgical wounds clean/dry and intact except for central incision site near umbilicus which seems to have serous drainage on gauze- site without erythema or increased warmth.  Extremities: moves all extremities Neuro: grossly intact   Brief Hospital Course: Eric [name] is 60 y.o. who presented with chest pain and abdominal pain.   On admission, patient with chest pain with a heart score of 4.  Patient with a  hx of CABG (1999), cardiac arrest. Risk factors include HTN, HLD, DM, hx of smoking, and ESRD. His troponins were trended x 0.04 x 3, EKG was negative for ST  depression or elevation. Cardiology was consulted and a myoview showing  a medium defect of mild severity present in the basal inferior and mid inferior location, findings consistent with prior myocardial infarction. The left  ventricular ejection fraction is mildly decreased (45-54%). Nuclear stress EF: 51%.  Patient on admission with epigastric and RUQ pain. Lipase obtained at 195, thus thought to have mild pancreatitis. Abdominal CT with contracted gallbladder with stones or sludge. No pericholecystic inflammation. Due to the presence of gallstone and pancreatitis, thought that patient abdominal pain was likely due to gallstone pancreatitis. On 5/25, surgery was consulted and gallbladder was removed. Per surgical note, patient was noted to have necrotic gallbladder with several small stones. Post-op patient had opioids for pain. However on post-opt day 2, there was some concern for ileus. Patient continued to worsening likely in part to opioids. Post-opt day 4, NGT tube was placed and patient was made NPO.  Imaging was concerning for small bowel obstruction, therefore small bowel protocol with gastrografin flow through and imaging was obtained. Imaging showed slow improvement of ileus with decreasing gas in the small bowel. Due to lack of improvement in the next day, repeat CT abdomen was obtained which showed high grade SBO due to small bowel in incisional hernia; patient was taken to OR for repair of hernia (6/1). On 6/3, NG tube was removed and patient was placed on clear liquid diet. His diet was slowly advanced. Repeat X-ray of abdomen showed resolving SBO. Patient required Miralax BID, Senna BID, Metamucil BID along with PRN suppositories to have a bowel movement. Near the end of hospitalization, patient was having bowel movement on Miralax BID, Senna BID, and Metamucil BID. Patient was discharged home on this regimen with instructions to scale back depending on stool output. Of note, his incision site of his umbilicus was draining serous fluid; surgery did evaluate and recommended continued dressing changes. Patient has follow up with surgery.   Renal: Patient was initially thought to have urinary retention. He had foley  catheter in place during surgical procedures and post op. After this was discontinued, patient was bladder scanned regularly to monitor and was I/O cathed as needed if appropriate; Bladder scans showed up to 340cc of urine. He was also started on Flomax. Patient did not report discomfort/pain. Patient did not void spontaneously. Discussed with nephrology, who believed that this is normal for patients progressing in ESRD and frequent bladder scans were not necessary. Nephrology did not recommend home with foley. Discussed with patient about home options including home with foley, self I/O cath, however patient declined. Counseled patient on calling nephrology or PCP immediately if he has bladder discomfort/pain.   Of note, patient was started no Aranesp per nephrology due to low hemoglobin.   Hypertension: Patient was restarted on Coreg during hospitalization. He was continued on Norvasc 10mg  and Imdur 60mg . Patient reported Clonidine and Hydralazine were discontinued by PCP. Clonidine and Hydralazine were not started during this hospitalization, and they were also held upon discharge.   DM2: Glucose was managed with sliding scale insulin and home Glipizide was held during hospitalization. Glipizide was resumed upon discharge  Extremity "Ache/Heaviness": Patient reported of symptoms that started after his surgery. Symptoms of heaviness of his extremities and cramping pain in the evening. Symptoms did not worsen with dialysis. BMP showed normal potassium. Neurological exam was normal. Patient was discharged with Tylenol PRN.   Issues for Follow Up:  - Ensure post-surgery follow up  - CT Abdomen noted 66mm pulmonary nodule on the  right. Recommend repeat chest CT non-contrast at 6-12 months  - will likely benefit from Urology referral   Significant Procedures: Cholecystectomy, Repair of incisional umbilical hernia  Significant Labs and Imaging:   Recent Labs Lab 06/28/15 0620 06/29/15 0234  06/30/15 0153  WBC 7.8 6.9 7.4  HGB 9.2* 8.7* 8.4*  HCT 28.8* 27.4* 25.9*  PLT 197 189 176    Recent Labs Lab 06/26/15 0943 06/27/15 0219 06/28/15 0620 06/29/15 0234 06/30/15 0153  NA 135 126* 129* 130* 125*  K 4.0 3.2* 4.6 4.2 4.1  CL 97* 90* 92* 91* 89*  CO2 25 23 26 29 27   GLUCOSE 112* 528* 101* 147* 145*  BUN 31* 33* 44* 21* 28*  CREATININE 7.77* 7.91* 9.67* 5.53* 6.67*  CALCIUM 7.9* 7.0* 7.7* 7.5* 7.4*  Lipase 195 > 18 Vit B12 566 RPR nonreactive Hep B surface antigen: Negative   6/6 DG Abdomen: FINDINGS: Right upper quadrant cholecystectomy clips. Oral contrast is seen into the decompressed left colon. The entire colon is decompressed. There are mildly dilated loops of small bowel identified. A loop in the right lower quadrant measures 3.1 cm. There is air into the rectum.  IMPRESSION: Findings suggest resolving small bowel obstruction with only mild relative small bowel distention and gas into the rectum  6/1: CT Abdomen/Pelvis:  IMPRESSION: 1. Small bowel obstruction, high grade, due to abdominal wall hernia in the periumbilical region. 2. Bilateral pulmonary parenchymal opacities. This could reflect pulmonary edema, pneumonitis, ARDS, etc.  3. 6 mm pulmonary nodule on the right Non-contrast chest CT at 6-12 months is recommended. If the nodule is stable at time of repeat CT, then future CT at 18-24 months (from today's scan) is considered optional for low-risk patients, but is recommended for high-risk patients. This recommendation follows the consensus statement: Guidelines for Management of Incidental Pulmonary Nodules Detected on CT Images:From the Fleischner Society 2017; published online before print (10.1148/radiol.IJ:2314499).  Critical Value/emergent results were called by telephone at the time of interpretation on 06/24/2015 at 5:45 pm to Sonia Baller, the patient's nurse, who will inform the referring physician. Who verbally acknowledged these  results.  5/31: DG Abdomen IMPRESSION: The degree of gaseous distention of small bowel loops has decreased slightly. No free extraluminal gas collections are observed.  5/29: DG Abdomen: IMPRESSION: 1. Nonspecific bowel gas pattern which could indicate either a partial small bowel obstruction or may simply reflect a small bowel postoperative ileus. 2. No pneumoperitoneum.  5/24: MYOCARDIAL IMAGING WITH SPECT (REST AND PHARMACOLOGIC-STRESS) IMPRESSION: 1. Suspected mild prior infarction involving the mid and basilar aspects of the inferior wall of the left ventricle. 2. No definitive scintigraphic evidence of superimposed pharmacologically induced ischemia. 3. Mild hypokinesia involving the inferior wall of the left ventricle at the location of suspected prior myocardial infarction. Otherwise, normal wall motion. Ejection fraction - 51%.  5/23: CT Abdomen IMPRESSION: 1. Contracted gallbladder with stones or sludge. No pericholecystic inflammation. 2. No CT findings of pancreatitis. 3. Markedly atrophic left kidney with compensatory hypertrophy of the right kidney. 4. Additional chronic findings as described.  5/22: CXR FINDINGS: Numerous leads and wires project over the chest. Dialysis catheter with tips at cavoatrial junction and high right atrium. Midline trachea. Prior median sternotomy. Pacer/AICD device. Cardiomegaly accentuated by AP portable technique. No pleural effusion or pneumothorax. Low lung volumes with resultant pulmonary interstitial prominence.  IMPRESSION: No acute cardiopulmonary disease.  Cardiomegaly without congestive failure  Results/Tests Pending at Time of Discharge: none  Discharge Medications:    Medication List  STOP taking these medications        aspirin EC 81 MG tablet     benzonatate 100 MG capsule  Commonly known as:  TESSALON     calcitRIOL 0.25 MCG capsule  Commonly known as:  ROCALTROL  Replaced by:  calcitRIOL 1  MCG/ML solution     cloNIDine 0.1 MG tablet  Commonly known as:  CATAPRES     hydrALAZINE 25 MG tablet  Commonly known as:  APRESOLINE     traMADol 50 MG tablet  Commonly known as:  ULTRAM      TAKE these medications        acetaminophen 325 MG tablet  Commonly known as:  TYLENOL  Take 2 tablets (650 mg total) by mouth every 6 (six) hours as needed for mild pain or moderate pain.     amLODipine 10 MG tablet  Commonly known as:  NORVASC  Take 1 tablet (10 mg total) by mouth daily.     calcitRIOL 1 MCG/ML solution  Commonly known as:  ROCALTROL  Place 1 mL (1 mcg total) into feeding tube every Monday, Wednesday, and Friday with hemodialysis.     calcium acetate 667 MG capsule  Commonly known as:  PHOSLO  Take 2 capsules (1,334 mg total) by mouth 3 (three) times daily with meals.     calcium carbonate 750 MG chewable tablet  Commonly known as:  TUMS EX  Chew 2 tablets by mouth daily as needed for heartburn (stomach pain).     carvedilol 6.25 MG tablet  Commonly known as:  COREG  Take 1 tablet (6.25 mg total) by mouth 2 (two) times daily with a meal.     cinacalcet 30 MG tablet  Commonly known as:  SENSIPAR  Take 30 mg by mouth daily with supper.     glipiZIDE 10 MG tablet  Commonly known as:  GLUCOTROL  Take 10 mg by mouth daily.     isosorbide mononitrate 60 MG 24 hr tablet  Commonly known as:  IMDUR  Take 60 mg by mouth every morning.     multivitamin Tabs tablet  Take 1 tablet by mouth at bedtime.     polyethylene glycol packet  Commonly known as:  MIRALAX / GLYCOLAX  Take 17 g by mouth 2 (two) times daily.     psyllium 95 % Pack  Commonly known as:  HYDROCIL/METAMUCIL  Take 1 packet by mouth 2 (two) times daily.     senna 8.6 MG Tabs tablet  Commonly known as:  SENOKOT  Take 1 tablet (8.6 mg total) by mouth 2 (two) times daily.     tamsulosin 0.4 MG Caps capsule  Commonly known as:  FLOMAX  Take 1 capsule (0.4 mg total) by mouth daily.         Discharge Instructions: Please refer to Patient Instructions section of EMR for full details.  Patient was counseled important signs and symptoms that should prompt return to medical care, changes in medications, dietary instructions, activity restrictions, and follow up appointments.   Follow-Up Appointments: Follow-up Information    Follow up with [facility] On 07/07/2015.   Specialty:  General Surgery   Why:  arrive by 9:30AM for a 10AM post op check   Contact information:   [address] [place] [address] [phone]       Follow up with [name], MICHELLE P, NP.   Specialty:  Internal Medicine   Why:  Please make a follow up appointment  for early next week for a hospital follow up appointment (it is very important that you are seen next week preferably early next week)   Contact information:   [place] [address] [phone]       Smiley Houseman, MD 07/01/2015, 3:16 PM PGY-1, [place]

## 2015-06-18 NOTE — Progress Notes (Signed)
Family Med provider on call paged due to pt continuing abdominal discomfort.  He appears more distended and uncomfortable that previously today.  GI cocktail administered.  He reports not having flatus or BM since surgery yesterday.  Was on a heart healthy diet for breakfast this morning.  MD will be in to see him and is ordering a stat abdominal x-ray.

## 2015-06-18 NOTE — Progress Notes (Signed)
Family Medicine Teaching Service Daily Progress Note Intern Pager: [phone]  Patient name: Eric [name] Medical record number: [id number] Date of birth: 1955-03-13 Age: 60 y.o. Gender: male  Primary Care Provider: [name], NP Consultants: None Code Status: Full  Pt Overview and Major Events to Date:   Assessment and Plan: Eric [name] is a 60 y.o. male presenting with chest pain/HTN . PMH is significant for HTN, HLD, Cardiac arrest, HFrEF, AICD, MI (2015), ESRD, Type 2 DM, CABG (1999), Hepatitis C (cirrhosis)  S/p cholecystectomy, POD1, Surgery note states gallbladder was necrotic and several small stones spilled out. - Over the last 12 hours received 25 mcg of Fenatyl, 10 mg of oxycodone, 1 mg hydromorphone - morphine equivalents 26.5 mg, reduced dose by 20-25% - Will provide Roxicodone 5 - 7.5 mg q6hrs prn for pain   Diffuse Numbness: Neuro exam normal, normal strength throughout. Possibly due to dialysis vs. Diabetic neuropathy, however seems to be acute onset. Per patient has VP shunt.  RPR non-reactive  and B12 normal. Pain episodes in legs bilaterally following phoslo yesterday, patient to discuss with nephrologist   HFrEF: EF 45-50%, moderate concentric hypertrophy, Systolic pressure was mildly to moderately increased. PA peak pressure: 47 mm Hg (S). Patient without SOB, no swelling in legs, no crackles of lung exam. BNP 75.2, CXR negative for pulmonary edema.   HTN BP 138/71 - Continue home clonidine, hydralazine, Norvasc 10 mg   ESRD: Nephrology following. M/W/F dialysis - Dialysis tomorrow per nephrology - AV fistula analyzed by vascular today.  - Consult nephrology - Renal panel  T2DM. Holding Glipizide, CBGs 80-100s  - CBGS ACQHS  - Sensitive sliding scale   FEN/GI:Carb modified diet  Prophylaxis: heparin SBQ  Disposition: Telemetry   Subjective:  Patient has not had a bowel movement or urinated. Would like to stay today and get dialysis tomorrow    Objective: Temp:  [96.9 F (36.1 C)-98.5 F (36.9 C)] 98.5 F (36.9 C) (05/26 0339) Pulse Rate:  [68-85] 85 (05/26 0339) Resp:  [10-24] 18 (05/26 0339) BP: (117-162)/(71-109) 138/71 mmHg (05/26 0339) SpO2:  [93 %-100 %] 94 % (05/26 0339) Weight:  [186 lb 4.6 oz (84.5 kg)-189 lb 6 oz (85.9 kg)] 186 lb 6.4 oz (84.55 kg) (05/26 0349) Physical Exam: General: Patient lying bed, NAD Cardiovascular: RRR, no murmurs  Respiratory: CTAB, no wheezing,  Abdomen: BS+, no ttp, no distention  Extremities: moving all extremities   Laboratory:  Recent Labs Lab 06/17/15 0332 06/17/15 0845 06/18/15 0309  WBC 5.8 5.0 7.5  HGB 9.9* 10.4* 10.6*  HCT 29.1* 30.3* 31.6*  PLT 120* 131* 122*    Recent Labs Lab 06/14/15 1355  06/17/15 0332 06/17/15 0845 06/18/15 0309  NA 131*  < > 123* 121* 130*  K 4.4  < > 4.7 4.9 4.9  CL 96*  < > 88* 89* 97*  CO2 22  < > 24 25 25   BUN 56*  < > 31* 34* 17  CREATININE 9.05*  < > 6.06* 6.18* 5.09*  CALCIUM 8.1*  < > 7.7* 7.7* 7.8*  PROT 9.2*  --   --   --   --   BILITOT 0.6  --   --   --   --   ALKPHOS 66  --   --   --   --   ALT 18  --   --   --   --   AST 11*  --   --   --   --  GLUCOSE 129*  < > 66 81 101*  < > = values in this interval not displayed.     Recent Labs Lab 06/14/15 1355 06/14/15 1817 06/14/15 2130 06/15/15 0017  TROPONINI 0.03 0.04* 0.04* 0.04*     Imaging/Diagnostic Tests: Nm Myocar Multi W/spect W/wall Motion / Ef  06/16/2015   Defect 1: There is a medium defect of mild severity present in the basal inferior and mid inferior location.  Findings consistent with prior myocardial infarction.  The left ventricular ejection fraction is mildly decreased (45-54%).  Nuclear stress EF: 51%.     Anibal Quinby Cletis Media, MD 06/18/2015, 8:06 AM PGY-1, [place] Intern pager: [phone], text pages welcome

## 2015-06-18 NOTE — Progress Notes (Signed)
Pt complained of severed abdominal pain/pressure. Upon assessment his abdomen seemed more distended and firm. He stated that he has not passed gas since the sx but pt has been walking around the room frequently. Pain medication and MD notified. Will continue to monitor pt closely.

## 2015-06-18 NOTE — Progress Notes (Signed)
Subjective:  S/p HD yest- removed 1400 ?- also lap chole- afeb and stable this AM- c/o abd pain  Objective Vital signs in last 24 hours: Filed Vitals:   06/17/15 1640 06/17/15 2118 06/18/15 0339 06/18/15 0349  BP: 138/80 117/75 138/71   Pulse: 79 84 85   Temp: 97.8 F (36.6 C) 98.4 F (36.9 C) 98.5 F (36.9 C)   TempSrc:  Oral Oral   Resp: 12 18 18    Weight:    84.55 kg (186 lb 6.4 oz)  SpO2: 93% 98% 94%    Weight change: 0.4 kg (14.1 oz)  Intake/Output Summary (Last 24 hours) at 06/18/15 0810 Last data filed at 06/17/15 1525  Gross per 24 hour  Intake    790 ml  Output   1418 ml  Net   -628 ml    Dialysis Orders: [facility] MWF 4 hr 15 min 180 400/800 2 K 2.25 Ca EDW 81.5 right upper AVF maturing   - placed 2/23 and right IJ heparin 7000 calcitriol 1 no ESA or Fe Recent labs: Hgb 12 5/17 44% sat iPTH 802 - prev on sensipar only - just added phoslo 2 ac as P up on May labs  Assessment/Plan: 1. symptomatic cholelithiasis - s/p lap chole 5/25 2. ESRD - normally MWF via PC even though AVF seems ready (placed 2/23) - only had had 30 min Monday as OP -had full treatment Tuesday off schedule- done Thursday and  Sat (tomorrrow) then [facility] to get back on schedule.  Will have [facility] give [place] permission to stick AVF as pt has been refusing unless they say so  3. Hypertension/volume - CXR neg for volume - pt has ONLY been taking norvasc. Clonidine and hydralazine were stopped 4/5 by Dr. Jimmy Footman -volume can come down some  Plan continue coreg.  challenge with next HD tomorrow- BP is fine 4. Anemia - hgb down some this am - follow - resume ESA- will follow 5. Metabolic bone disease - Continue binders(phoslo)/sensipar/calcitriol ^ to 1.0- HD off schedule-  6. Nutrition - on CL -added vits 7. Hx CABG s/p ICD 8. Hyponatremia-  Challenge volume, is better     Efrem Pitstick A    Labs: Basic Metabolic Panel:  Recent Labs Lab 06/17/15 0332 06/17/15 0845 06/18/15 0309  NA 123* 121*  130*  K 4.7 4.9 4.9  CL 88* 89* 97*  CO2 24 25 25   GLUCOSE 66 81 101*  BUN 31* 34* 17  CREATININE 6.06* 6.18* 5.09*  CALCIUM 7.7* 7.7* 7.8*  PHOS 4.4 4.4 5.1*   Liver Function Tests:  Recent Labs Lab 06/14/15 1355  06/17/15 0332 06/17/15 0845 06/18/15 0309  AST 11*  --   --   --   --   ALT 18  --   --   --   --   ALKPHOS 66  --   --   --   --   BILITOT 0.6  --   --   --   --   PROT 9.2*  --   --   --   --   ALBUMIN 3.2*  < > 2.6* 2.8* 2.9*  < > = values in this interval not displayed.  Recent Labs Lab 06/14/15 1816  LIPASE 195*   No results for input(s): AMMONIA in the last 168 hours. CBC:  Recent Labs Lab 06/14/15 1355 06/15/15 0017 06/17/15 0332 06/17/15 0845 06/18/15 0309  WBC 6.0 6.3 5.8 5.0 7.5  HGB 12.0* 10.7* 9.9* 10.4* 10.6*  HCT 35.8* 33.2* 29.1* 30.3* 31.6*  MCV 87.3 89.2 86.1 84.6 87.3  PLT 130* 127* 120* 131* 122*   Cardiac Enzymes:  Recent Labs Lab 06/14/15 1355 06/14/15 1817 06/14/15 2130 06/15/15 0017  TROPONINI 0.03 0.04* 0.04* 0.04*   CBG:  Recent Labs Lab 06/17/15 0612 06/17/15 1243 06/17/15 1459 06/17/15 2106 06/18/15 0614  GLUCAP 85 74 77 118* 88    Iron Studies: No results for input(s): IRON, TIBC, TRANSFERRIN, FERRITIN in the last 72 hours. Studies/Results: Nm Myocar Multi W/spect W/wall Motion / Ef  06/16/2015   Defect 1: There is a medium defect of mild severity present in the basal inferior and mid inferior location.  Findings consistent with prior myocardial infarction.  The left ventricular ejection fraction is mildly decreased (45-54%).  Nuclear stress EF: 51%.    Medications: Infusions: . sodium chloride Stopped (06/17/15 1453)    Scheduled Medications: . amLODipine  10 mg Oral Daily  . calcitRIOL  1 mcg Oral Q M,W,F-HD  . calcium acetate  1,334 mg Oral TID WC  . carvedilol  6.25 mg Oral BID WC  . cinacalcet  30 mg Oral Q supper  . darbepoetin (ARANESP) injection - DIALYSIS  100 mcg Intravenous Q  Thu-HD  . gi cocktail  30 mL Oral Once  . heparin  5,000 Units Subcutaneous Q8H  . insulin aspart  0-9 Units Subcutaneous TID WC  . isosorbide mononitrate  60 mg Oral Daily  . multivitamin  1 tablet Oral QHS    have reviewed scheduled and prn medications.  Physical Exam: General: sitting up in chair- NAD Heart: RRR Lungs: clear Abdomen: soft, non tender Extremities: no overt peripheral edema Dialysis Access: right sided PC and right AVF good thrill and bruit     06/18/2015,8:10 AM  LOS: 3 days

## 2015-06-18 NOTE — Progress Notes (Signed)
Giving report at bedside to oncoming night RN, pt having episode of vomiting.  June, RN for nights, given report and will continue to monitor and will notify MD if vomiting persists.

## 2015-06-18 NOTE — Consult Note (Signed)
Vascular and Vein Specialist of [facility]  Patient name: Eric [name] MRN: [id number] DOB: [birth year] Sex: male  REASON FOR CONSULT: Evaluate right upper arm AVF  HPI: Eric [name] is a 60 y.o. male, who presents for evaluation of right brachiocephalic AV fistula created 03/18/2015 by  Dr. Scot Dock. The patient has refused cannulation for dialysis until evaluated by VVS. The patient previously canceled his 6 week post-operative follow-up due to insurance reasons and conflict with dialysis schedule. He has not been evaluated in the office since his surgery. He is currently dialyzing via a right IJ tunneled dialysis catheter.   He was admitted on 06/14/15 secondary to chest, pain, hypertension and abdominal pain. He has a history of cardiac arrest in the past s/p ICD. He underwent lap chole yesterday.   Past Medical History  Diagnosis Date  . Hypertension   . Hyperlipemia   . Edema   . 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 Obesity   . Ventricular tachycardia ([place])   . AICD (automatic cardioverter/defibrillator) present   . CHF (congestive heart failure) ([place])   . Myocardial infarction ([place]) 03/2013  . Pneumonia 2016  . History of blood transfusion 1950's    "related to pinal menigitis; HAD 16 OPERATIONS TOTAL"  . Scabies infestation 06/29/2014  . Shortness of breath dyspnea   . Type II diabetes mellitus ([place])     type 2  . CKD (chronic kidney disease), stage III     Family History  Problem Relation Age of Onset  . Heart disease Mother   . Diabetes Mother     SOCIAL HISTORY: Social History   Social History  . Marital Status: Married    Spouse Name: N/A  . Number of Children: N/A  . Years of Education: N/A   Occupational History  . Not on file.   Social History Main Topics  . Smoking status: Former Smoker -- 1.00 packs/day for 5 years    Types: Cigarettes  . Smokeless tobacco: Never Used     Comment: "quit smoking in the 1960's"  . Alcohol  Use: No  . Drug Use: No  . Sexual Activity: Not Currently   Other Topics Concern  . Not on file   Social History Narrative    No Known Allergies  Current Facility-Administered Medications  Medication Dose Route Frequency Provider Last Rate Last Dose  . 0.9 %  sodium chloride infusion   Intravenous Continuous Coralie Keens, MD   Stopped at 06/17/15 1453  . acetaminophen (TYLENOL) tablet 650 mg  650 mg Oral Q4H PRN Asiyah Cletis Media, MD   650 mg at 06/18/15 1124  . amLODipine (NORVASC) tablet 10 mg  10 mg Oral Daily Asiyah Cletis Media, MD   10 mg at 06/18/15 1014  . calcitRIOL (ROCALTROL) capsule 1 mcg  1 mcg Oral Q M,W,F-HD Alric Seton, PA-C   1 mcg at 06/16/15 1200  . calcium acetate (PHOSLO) capsule 1,334 mg  1,334 mg Oral TID WC Alric Seton, PA-C   1,334 mg at 06/16/15 1647  . carvedilol (COREG) tablet 6.25 mg  6.25 mg Oral BID WC Asiyah Cletis Media, MD   6.25 mg at 06/18/15 [id number]  . cinacalcet (SENSIPAR) tablet 30 mg  30 mg Oral Q supper Alric Seton, PA-C   30 mg at 06/16/15 1647  . cloNIDine (CATAPRES) tablet 0.1 mg  0.1 mg Oral BID PRN Alric Seton, PA-C      . [START ON 06/25/2015] Darbepoetin Alfa (ARANESP) injection 100  mcg  100 mcg Intravenous Q Fri-HD Dickie La, MD      . docusate sodium (COLACE) capsule 100 mg  100 mg Oral BID Emina Riebock, NP   100 mg at 06/18/15 1015  . gi cocktail (Maalox,Lidocaine,Donnatal)  30 mL Oral QID PRN Asiyah Cletis Media, MD      . gi cocktail (Maalox,Lidocaine,Donnatal)  30 mL Oral Once Aquilla Hacker, MD   30 mL at 06/15/15 0126  . heparin injection 5,000 Units  5,000 Units Subcutaneous Q8H Asiyah Cletis Media, MD   5,000 Units at 06/18/15 [phone]  . HYDROmorphone (DILAUDID) tablet 1 mg  1 mg Oral Q6H PRN Asiyah Cletis Media, MD   1 mg at 06/18/15 1024  . insulin aspart (novoLOG) injection 0-9 Units  0-9 Units Subcutaneous TID WC Asiyah Cletis Media, MD   2 Units at 06/16/15 1648  . isosorbide mononitrate (IMDUR) 24 hr tablet 60  mg  60 mg Oral Daily Asiyah Cletis Media, MD   60 mg at 06/18/15 1014  . multivitamin (RENA-VIT) tablet 1 tablet  1 tablet Oral QHS Alric Seton, PA-C   1 tablet at 06/17/15 2122  . ondansetron (ZOFRAN) injection 4 mg  4 mg Intravenous Q6H PRN Asiyah Cletis Media, MD   4 mg at 06/17/15 1435  . polyethylene glycol (MIRALAX / GLYCOLAX) packet 17 g  17 g Oral Daily Emina Riebock, NP   17 g at 06/18/15 1013    REVIEW OF SYSTEMS:  [X]  denotes positive finding, [ ]  denotes negative finding Cardiac  Comments:  Chest pain or chest pressure:    Shortness of breath upon exertion:    Short of breath when lying flat:    Irregular heart rhythm:        Vascular    Pain in calf, thigh, or hip brought on by ambulation:    Pain in feet at night that wakes you up from your sleep:     Blood clot in your veins:    Leg swelling:         Pulmonary    Oxygen at home:    Productive cough:     Wheezing:         Neurologic    Sudden weakness in arms or legs:     Sudden numbness in arms or legs:     Sudden onset of difficulty speaking or slurred speech:    Temporary loss of vision in one eye:     Problems with dizziness:         Gastrointestinal    Blood in stool:     Vomited blood:         Genitourinary    Burning when urinating:     Blood in urine:        Psychiatric    Major depression:         Hematologic    Bleeding problems:    Problems with blood clotting too easily:        Skin    Rashes or ulcers:        Constitutional    Fever or chills:      PHYSICAL EXAM: Filed Vitals:   06/17/15 1640 06/17/15 2118 06/18/15 0339 06/18/15 0349  BP: 138/80 117/75 138/71   Pulse: 79 84 85   Temp: 97.8 F (36.6 C) 98.4 F (36.9 C) 98.5 F (36.9 C)   TempSrc:  Oral Oral   Resp: 12 18 18    Weight:    186 lb 6.4  oz (84.55 kg)  SpO2: 93% 98% 94%     GENERAL: The patient is a well-nourished male, in no acute distress. The vital signs are documented above. VASCULAR: Palpable thrill  right upper arm fistula. Thrill is more difficult to palpate at mid upper arm.  PULMONARY: Non labored respiratory effort.  MUSCULOSKELETAL: There are no major deformities or cyanosis. NEUROLOGIC: No focal deficits.  SKIN: There are no ulcers or rashes noted. PSYCHIATRIC: The patient has a normal affect.   MEDICAL ISSUES: ESRD  S/P right brachiocephalic AV fistula [id number]  The patient has not had outpatient follow-up since his surgery due to dialysis scheduling conflicts and insurance issues. He has an easily palpable thrill proximal to the anastomosis at the antecubital fossa.  The thrill is more difficult to feel towards his mid upper arm. Will obtain dialysis access duplex for further evaluation. Continue TDC for HD.   Other active issues: chest pain, gallstone pancreatitis s/p lap chole POD 1, diabetes type II, hypertension.     Virgina Jock, PA-C [facility] [place]     I have examined the patient, reviewed and agree with above. Patient has a very poor understanding of care issues including hemodialysis. Does have a very nice right upper arm AV fistula which was created by Dr. Scot Dock on 03/18/2015. There is an excellent thrill and by physical exam appears to have good size maturation. Will obtain duplex to confirm this but should be acceptable for hemodialysis access at any time. We will make this confirming recommendation pending duplex.  Curt Jews, MD 06/18/2015 2:29 PM

## 2015-06-18 NOTE — Progress Notes (Signed)
Pt has not voided since before his surgery yesterday morning, 52cc noted via bladder scan

## 2015-06-19 ENCOUNTER — Inpatient Hospital Stay (HOSPITAL_COMMUNITY): Payer: Medicaid Other

## 2015-06-19 DIAGNOSIS — N186 End stage renal disease: Secondary | ICD-10-CM

## 2015-06-19 LAB — CBC
HCT: 32.5 % — ABNORMAL LOW (ref 39.0–52.0)
HEMOGLOBIN: 10.5 g/dL — AB (ref 13.0–17.0)
MCH: 28.7 pg (ref 26.0–34.0)
MCHC: 32.3 g/dL (ref 30.0–36.0)
MCV: 88.8 fL (ref 78.0–100.0)
Platelets: 141 10*3/uL — ABNORMAL LOW (ref 150–400)
RBC: 3.66 MIL/uL — ABNORMAL LOW (ref 4.22–5.81)
RDW: 12.8 % (ref 11.5–15.5)
WBC: 12.5 10*3/uL — ABNORMAL HIGH (ref 4.0–10.5)

## 2015-06-19 LAB — RENAL FUNCTION PANEL
ANION GAP: 12 (ref 5–15)
Albumin: 3.1 g/dL — ABNORMAL LOW (ref 3.5–5.0)
BUN: 29 mg/dL — ABNORMAL HIGH (ref 6–20)
CALCIUM: 8.6 mg/dL — AB (ref 8.9–10.3)
CO2: 23 mmol/L (ref 22–32)
Chloride: 91 mmol/L — ABNORMAL LOW (ref 101–111)
Creatinine, Ser: 7.29 mg/dL — ABNORMAL HIGH (ref 0.61–1.24)
GFR calc Af Amer: 8 mL/min — ABNORMAL LOW (ref >60)
GFR calc non Af Amer: 7 mL/min — ABNORMAL LOW (ref >60)
GLUCOSE: 117 mg/dL — AB (ref 65–99)
Phosphorus: 6.2 mg/dL — ABNORMAL HIGH (ref 2.5–4.6)
Potassium: 5.3 mmol/L — ABNORMAL HIGH (ref 3.5–5.1)
SODIUM: 126 mmol/L — AB (ref 135–145)

## 2015-06-19 LAB — GLUCOSE, CAPILLARY
GLUCOSE-CAPILLARY: 160 mg/dL — AB (ref 65–99)
GLUCOSE-CAPILLARY: 86 mg/dL (ref 65–99)
GLUCOSE-CAPILLARY: 87 mg/dL (ref 65–99)
Glucose-Capillary: 92 mg/dL (ref 65–99)

## 2015-06-19 MED ORDER — SODIUM CHLORIDE 0.9 % IV SOLN
8.0000 mg | Freq: Four times a day (QID) | INTRAVENOUS | Status: DC | PRN
  Administered 2015-06-19 (×3): 8 mg via INTRAVENOUS
  Filled 2015-06-19 (×6): qty 4

## 2015-06-19 MED ORDER — FENTANYL CITRATE (PF) 100 MCG/2ML IJ SOLN
25.0000 ug | INTRAMUSCULAR | Status: DC | PRN
  Administered 2015-06-19 – 2015-06-21 (×11): 25 ug via INTRAVENOUS
  Filled 2015-06-19 (×9): qty 2

## 2015-06-19 MED ORDER — HEPARIN SODIUM (PORCINE) 1000 UNIT/ML DIALYSIS: 20.0000 [IU]/kg | INTRAMUSCULAR | Status: DC | PRN

## 2015-06-19 MED ORDER — FENTANYL CITRATE (PF) 100 MCG/2ML IJ SOLN
INTRAMUSCULAR | Status: AC
  Filled 2015-06-19: qty 2

## 2015-06-19 NOTE — Progress Notes (Signed)
*  PRELIMINARY RESULTS* Vascular Ultrasound Duplex Dialysis Access (AVF, AGV) has been completed.  Preliminary findings: Right arm AVF appears patent without areas of stenosis.    Landry Mellow, RDMS, RVT  06/19/2015, 9:41 AM

## 2015-06-19 NOTE — Progress Notes (Signed)
Family Medicine Teaching Service Daily Progress Note Intern Pager: [phone]  Patient name: Eric [name] Medical record number: [id number] Date of birth: [birth year] Age: 60 y.o. Gender: male  Primary Care Provider: [name], NP Consultants: None Code Status: Full  Pt Overview and Major Events to Date:   Assessment and Plan: Eric [name] is a 60 y.o. male presenting with chest pain/HTN . PMH is significant for HTN, HLD, Cardiac arrest, HFrEF, AICD, MI (2015), ESRD, Type 2 DM, CABG (1999), Hepatitis C (cirrhosis)  S/p cholecystectomy, POD2- 2/2 symptomatic cholelithiasis. No BM or flatus yet.  - KUB overnight 2/2 abd pain. No obvious free air.  - Back to IV pain medicine 2/2 pain this am.  - Zofran for nausea.  - NPO overnight. Keep npo for now.  - If further vomiting will need NGT. - Miralax for BM this am.  - Ambulate  Diffuse Numbness: Neuro exam normal, normal strength throughout. Likely neuropathy,though mixed neuro exam. Pt. Thinks it is related to phoslo. RPR, B12 normal.  - Stable. Continue to follow.   HFrEF: EF 45-50%, moderate concentric hypertrophy, Systolic pressure was mildly to moderately increased. PA peak pressure: 47 mm Hg (S). Volume well controlled with dialysis - MWF Dialysis for volume control - Continue Coreg, consider low dose ace inhibitor.   HTN BP 138/71 - Continue home clonidine, hydralazine, Norvasc 10 mg, Coreg   ESRD: Nephrology following. M/W/F dialysis, refusing today.  - Dialysis if able today - AV fistula analyzed by vascular, good flow. - Renal panel daily  T2DM. Holding Glipizide, CBGs 80-100s  - CBGS ACQHS  - Sensitive sliding scale   FEN/GI:Carb modified diet  Prophylaxis: heparin SBQ  Disposition: Telemetry   Subjective:  Abdominal distension overnight. Some vomiting last night, but none this am. Feels like he needs to have BM, but "nothing comes out". Not passing gas yet. Up walking around on my entrance. Refusing  Dialysis.   Objective: Temp:  [98.1 F (36.7 C)-98.8 F (37.1 C)] 98.7 F (37.1 C) (05/27 0413) Pulse Rate:  [78-91] 91 (05/27 0413) Resp:  [14-16] 16 (05/27 0413) BP: (128-143)/(70-76) 133/71 mmHg (05/27 0413) SpO2:  [92 %-98 %] 92 % (05/27 0413) Physical Exam: General: Patient lying bed, NAD Cardiovascular: RRR, no murmurs  Respiratory: CTAB, no wheezing,  Abdomen: BS+, TTP this morning, some distension noted. Extremities: moving all extremities   Laboratory:  Recent Labs Lab 06/17/15 0332 06/17/15 0845 06/18/15 0309  WBC 5.8 5.0 7.5  HGB 9.9* 10.4* 10.6*  HCT 29.1* 30.3* 31.6*  PLT 120* 131* 122*    Recent Labs Lab 06/14/15 1355  06/17/15 0332 06/17/15 0845 06/18/15 0309  NA 131*  < > 123* 121* 130*  K 4.4  < > 4.7 4.9 4.9  CL 96*  < > 88* 89* 97*  CO2 22  < > 24 25 25   BUN 56*  < > 31* 34* 17  CREATININE 9.05*  < > 6.06* 6.18* 5.09*  CALCIUM 8.1*  < > 7.7* 7.7* 7.8*  PROT 9.2*  --   --   --   --   BILITOT 0.6  --   --   --   --   ALKPHOS 66  --   --   --   --   ALT 18  --   --   --   --   AST 11*  --   --   --   --   GLUCOSE 129*  < > 66 81 101*  < > =  values in this interval not displayed.     Recent Labs Lab 06/14/15 1355 06/14/15 1817 06/14/15 2130 06/15/15 0017  TROPONINI 0.03 0.04* 0.04* 0.04*     Imaging/Diagnostic Tests: Dg Abd Portable 2v  06/18/2015  CLINICAL DATA:  Abdominal distension EXAM: PORTABLE ABDOMEN - 2 VIEW COMPARISON:  CT abdomen 06/15/2015 FINDINGS: The bowel gas pattern is normal. There is no evidence of free air. No radio-opaque calculi or other significant radiographic abnormality is seen. IMPRESSION: Negative. Electronically Signed   By: Kathreen Devoid   On: 06/18/2015 18:27    Aquilla Hacker, MD 06/19/2015, 8:34 AM PGY-2, [place] Intern pager: [phone], text pages welcome

## 2015-06-19 NOTE — Progress Notes (Addendum)
Dialysis access duplex ordered yesterday.  Order is active.  Awaiting results.   Eric [name] 06/19/2015 9:11 AM

## 2015-06-19 NOTE — Progress Notes (Signed)
Patient ID: Eric [name], male   DOB: 10-06-1955, 60 y.o.   MRN: [id number] Right upper arm fistula duplex reviewed. Patent without evidence of stenosis. Okay to use right upper arm fistula for access at any time.

## 2015-06-19 NOTE — Progress Notes (Addendum)
Pt throwing up. Complaining of abdominal pain. Abdomen tender, and bowel sounds not heard. MD paged. New orders given. Will continue to monitor. MD states if pt continues to vomit, NG tube will be inserted.  Raliegh Ip RN

## 2015-06-19 NOTE — Progress Notes (Signed)
Subjective: afeb and stable this AM- c/o abd pain - says that he will be refusing HD today "had a bad night" still no flatus or BM Objective Vital signs in last 24 hours: Filed Vitals:   06/18/15 0349 06/18/15 1309 06/18/15 2214 06/19/15 0413  BP:  128/70 143/76 133/71  Pulse:  78 85 91  Temp:  98.8 F (37.1 C) 98.1 F (36.7 C) 98.7 F (37.1 C)  TempSrc:  Oral Oral Oral  Resp:  14 16 16   Weight: 84.55 kg (186 lb 6.4 oz)     SpO2:  98% 94% 92%   Weight change:   Intake/Output Summary (Last 24 hours) at 06/19/15 0731 Last data filed at 06/19/15 0700  Gross per 24 hour  Intake    108 ml  Output    100 ml  Net      8 ml    Dialysis Orders: [facility] MWF 4 hr 15 min 180 400/800 2 K 2.25 Ca EDW 81.5 right upper AVF maturing   - placed 2/23 and right IJ heparin 7000 calcitriol 1 no ESA or Fe Recent labs: Hgb 12 5/17 44% sat iPTH 802 - prev on sensipar only - just added phoslo 2 ac as P up on May labs  Assessment/Plan: 1. symptomatic cholelithiasis - s/p lap chole 5/25- seems to have ileus- management per surg and primary team 2. ESRD - normally MWF via PC even though AVF seems ready (placed 2/23)-  Appreciate VVS input - only had had 30 min Monday as OP -had full treatment Tuesday and Thursday.  Wanted to do HD today then Monday- as of now pt refusing.  I will check labs to make sure no urgent need for HD and we will try again to convince him later 3. Hypertension/volume - CXR neg for volume - only on norvasc and coreg -volume can come down some  Plan to challenge with next HD hopefully today, if not Monday- BP is fine 4. Anemia - hgb down some  - resumed ESA- will follow 5. Metabolic bone disease - Continue binders(phoslo)/sensipar/calcitriol ^ to 1.0- HD  6. Nutrition - on CL -added vits 7. Hx CABG s/p ICD 8. Hyponatremia-  Challenging volume, is better     Chesley Veasey A    Labs: Basic Metabolic Panel:  Recent Labs Lab 06/17/15 0332 06/17/15 0845 06/18/15 0309   NA 123* 121* 130*  K 4.7 4.9 4.9  CL 88* 89* 97*  CO2 24 25 25   GLUCOSE 66 81 101*  BUN 31* 34* 17  CREATININE 6.06* 6.18* 5.09*  CALCIUM 7.7* 7.7* 7.8*  PHOS 4.4 4.4 5.1*   Liver Function Tests:  Recent Labs Lab 06/14/15 1355  06/17/15 0332 06/17/15 0845 06/18/15 0309  AST 11*  --   --   --   --   ALT 18  --   --   --   --   ALKPHOS 66  --   --   --   --   BILITOT 0.6  --   --   --   --   PROT 9.2*  --   --   --   --   ALBUMIN 3.2*  < > 2.6* 2.8* 2.9*  < > = values in this interval not displayed.  Recent Labs Lab 06/14/15 1816  LIPASE 195*   No results for input(s): AMMONIA in the last 168 hours. CBC:  Recent Labs Lab 06/14/15 1355 06/15/15 0017 06/17/15 0332 06/17/15 0845 06/18/15 0309  WBC 6.0 6.3  5.8 5.0 7.5  HGB 12.0* 10.7* 9.9* 10.4* 10.6*  HCT 35.8* 33.2* 29.1* 30.3* 31.6*  MCV 87.3 89.2 86.1 84.6 87.3  PLT 130* 127* 120* 131* 122*   Cardiac Enzymes:  Recent Labs Lab 06/14/15 1355 06/14/15 1817 06/14/15 2130 06/15/15 0017  TROPONINI 0.03 0.04* 0.04* 0.04*   CBG:  Recent Labs Lab 06/18/15 0614 06/18/15 1204 06/18/15 1638 06/18/15 2210 06/19/15 0617  GLUCAP 88 141* 106* 137* 160*    Iron Studies: No results for input(s): IRON, TIBC, TRANSFERRIN, FERRITIN in the last 72 hours. Studies/Results: Dg Abd Portable 2v  06/18/2015  CLINICAL DATA:  Abdominal distension EXAM: PORTABLE ABDOMEN - 2 VIEW COMPARISON:  CT abdomen 06/15/2015 FINDINGS: The bowel gas pattern is normal. There is no evidence of free air. No radio-opaque calculi or other significant radiographic abnormality is seen. IMPRESSION: Negative. Electronically Signed   By: Kathreen Devoid   On: 06/18/2015 18:27   Medications: Infusions: . sodium chloride Stopped (06/17/15 1453)    Scheduled Medications: . amLODipine  10 mg Oral Daily  . calcitRIOL  1 mcg Oral Q M,W,F-HD  . calcium acetate  1,334 mg Oral TID WC  . carvedilol  6.25 mg Oral BID WC  . cinacalcet  30 mg Oral Q  supper  . [START ON 06/25/2015] darbepoetin (ARANESP) injection - DIALYSIS  100 mcg Intravenous Q Fri-HD  . docusate sodium  100 mg Oral BID  . gi cocktail  30 mL Oral Once  . heparin  5,000 Units Subcutaneous Q8H  . insulin aspart  0-9 Units Subcutaneous TID WC  . isosorbide mononitrate  60 mg Oral Daily  . multivitamin  1 tablet Oral QHS  . polyethylene glycol  17 g Oral Daily    have reviewed scheduled and prn medications.  Physical Exam: General: sitting up in chair- NAD Heart: RRR Lungs: clear Abdomen: more distended Extremities: no overt peripheral edema Dialysis Access: right sided PC and right AVF good thrill and bruit     06/19/2015,7:31 AM  LOS: 4 days

## 2015-06-19 NOTE — Progress Notes (Signed)
Pt stated that he will be refusing HD this morning due to abdominal pain he stated also that his leg will cramp and he would have to sign off. He is agreeable for HD on Monday. Arthor Captain LPN

## 2015-06-19 NOTE — Progress Notes (Signed)
Patient ID: Eric [name], male   DOB: 04/16/1955, 60 y.o.   MRN: [id number]     [address] [place], [place] [address]    Phone: [phone] FAX: [phone]     Subjective: Vomited x2 last night.  No flatus.  Now NPO.   Objective:  Vital signs:  Filed Vitals:   06/18/15 0349 06/18/15 1309 06/18/15 2214 06/19/15 0413  BP:  128/70 143/76 133/71  Pulse:  78 85 91  Temp:  98.8 F (37.1 C) 98.1 F (36.7 C) 98.7 F (37.1 C)  TempSrc:  Oral Oral Oral  Resp:  '14 16 16  '$ Weight: 84.55 kg (186 lb 6.4 oz)     SpO2:  98% 94% 92%    Last BM Date: 06/15/15 (Prior to surgery)  Intake/Output   Yesterday:  05/26 0701 - 05/27 0700 In: 108 [IV Piggyback:108] Out: 100 [Emesis/NG output:100] This shift:   Physical Exam: General: Pt awake/alert/oriented x4 in no acute distress Abdomen: +BS, distended.  Tender over incisions. Incisions are c/d/i.   Problem List:   Active Problems:   Chest pain   Numbness   RUQ abdominal pain   Gallbladder calculus   Pancreatitis   Gallstones   ESRD (end stage renal disease) ([place])    Results:   Labs: Results for orders placed or performed during the hospital encounter of 06/14/15 (from the past 48 hour(s))  Glucose, capillary     Status: None   Collection Time: 06/17/15 12:43 PM  Result Value Ref Range   Glucose-Capillary 74 65 - 99 mg/dL   Comment 1 Notify RN    Comment 2 Document in Chart   Glucose, capillary     Status: None   Collection Time: 06/17/15  2:59 PM  Result Value Ref Range   Glucose-Capillary 77 65 - 99 mg/dL  Glucose, capillary     Status: Abnormal   Collection Time: 06/17/15  9:06 PM  Result Value Ref Range   Glucose-Capillary 118 (H) 65 - 99 mg/dL   Comment 1 Notify RN    Comment 2 Document in Chart   CBC     Status: Abnormal   Collection Time: 06/18/15  3:09 AM  Result Value Ref Range   WBC 7.5 4.0 - 10.5 K/uL   RBC 3.62 (L) 4.22 - 5.81  MIL/uL   Hemoglobin 10.6 (L) 13.0 - 17.0 g/dL   HCT 31.6 (L) 39.0 - 52.0 %   MCV 87.3 78.0 - 100.0 fL   MCH 29.3 26.0 - 34.0 pg   MCHC 33.5 30.0 - 36.0 g/dL   RDW 12.7 11.5 - 15.5 %   Platelets 122 (L) 150 - 400 K/uL  Renal function panel     Status: Abnormal   Collection Time: 06/18/15  3:09 AM  Result Value Ref Range   Sodium 130 (L) 135 - 145 mmol/L    Comment: DELTA CHECK NOTED   Potassium 4.9 3.5 - 5.1 mmol/L   Chloride 97 (L) 101 - 111 mmol/L   CO2 25 22 - 32 mmol/L   Glucose, Bld 101 (H) 65 - 99 mg/dL   BUN 17 6 - 20 mg/dL   Creatinine, Ser 5.09 (H) 0.61 - 1.24 mg/dL   Calcium 7.8 (L) 8.9 - 10.3 mg/dL   Phosphorus 5.1 (H) 2.5 - 4.6 mg/dL   Albumin 2.9 (L) 3.5 - 5.0 g/dL   GFR calc non Af Amer 11 (L) >60 mL/min  GFR calc Af Amer 13 (L) >60 mL/min    Comment: (NOTE) The eGFR has been calculated using the CKD EPI equation. This calculation has not been validated in all clinical situations. eGFR's persistently <60 mL/min signify possible Chronic Kidney Disease.    Anion gap 8 5 - 15  Glucose, capillary     Status: None   Collection Time: 06/18/15  6:14 AM  Result Value Ref Range   Glucose-Capillary 88 65 - 99 mg/dL  Glucose, capillary     Status: Abnormal   Collection Time: 06/18/15 12:04 PM  Result Value Ref Range   Glucose-Capillary 141 (H) 65 - 99 mg/dL   Comment 1 Notify RN    Comment 2 Document in Chart   Glucose, capillary     Status: Abnormal   Collection Time: 06/18/15  4:38 PM  Result Value Ref Range   Glucose-Capillary 106 (H) 65 - 99 mg/dL   Comment 1 Notify RN    Comment 2 Document in Chart   Glucose, capillary     Status: Abnormal   Collection Time: 06/18/15 10:10 PM  Result Value Ref Range   Glucose-Capillary 137 (H) 65 - 99 mg/dL  Glucose, capillary     Status: Abnormal   Collection Time: 06/19/15  6:17 AM  Result Value Ref Range   Glucose-Capillary 160 (H) 65 - 99 mg/dL  Renal function panel     Status: Abnormal   Collection Time: 06/19/15   8:05 AM  Result Value Ref Range   Sodium 126 (L) 135 - 145 mmol/L   Potassium 5.3 (H) 3.5 - 5.1 mmol/L   Chloride 91 (L) 101 - 111 mmol/L   CO2 23 22 - 32 mmol/L   Glucose, Bld 117 (H) 65 - 99 mg/dL   BUN 29 (H) 6 - 20 mg/dL   Creatinine, Ser 7.29 (H) 0.61 - 1.24 mg/dL   Calcium 8.6 (L) 8.9 - 10.3 mg/dL   Phosphorus 6.2 (H) 2.5 - 4.6 mg/dL   Albumin 3.1 (L) 3.5 - 5.0 g/dL   GFR calc non Af Amer 7 (L) >60 mL/min   GFR calc Af Amer 8 (L) >60 mL/min    Comment: (NOTE) The eGFR has been calculated using the CKD EPI equation. This calculation has not been validated in all clinical situations. eGFR's persistently <60 mL/min signify possible Chronic Kidney Disease.    Anion gap 12 5 - 15  CBC     Status: Abnormal   Collection Time: 06/19/15  8:06 AM  Result Value Ref Range   WBC 12.5 (H) 4.0 - 10.5 K/uL   RBC 3.66 (L) 4.22 - 5.81 MIL/uL   Hemoglobin 10.5 (L) 13.0 - 17.0 g/dL   HCT 32.5 (L) 39.0 - 52.0 %   MCV 88.8 78.0 - 100.0 fL   MCH 28.7 26.0 - 34.0 pg   MCHC 32.3 30.0 - 36.0 g/dL   RDW 12.8 11.5 - 15.5 %   Platelets 141 (L) 150 - 400 K/uL    Imaging / Studies: Dg Abd Portable 2v  06/18/2015  CLINICAL DATA:  Abdominal distension EXAM: PORTABLE ABDOMEN - 2 VIEW COMPARISON:  CT abdomen 06/15/2015 FINDINGS: The bowel gas pattern is normal. There is no evidence of free air. No radio-opaque calculi or other significant radiographic abnormality is seen. IMPRESSION: Negative. Electronically Signed   By: Kathreen Devoid   On: 06/18/2015 18:27    Medications / Allergies:  Scheduled Meds: . amLODipine  10 mg Oral Daily  . calcitRIOL  1 mcg  Oral Q M,W,F-HD  . calcium acetate  1,334 mg Oral TID WC  . carvedilol  6.25 mg Oral BID WC  . cinacalcet  30 mg Oral Q supper  . [START ON 06/25/2015] darbepoetin (ARANESP) injection - DIALYSIS  100 mcg Intravenous Q Fri-HD  . docusate sodium  100 mg Oral BID  . gi cocktail  30 mL Oral Once  . heparin  5,000 Units Subcutaneous Q8H  . insulin aspart   0-9 Units Subcutaneous TID WC  . isosorbide mononitrate  60 mg Oral Daily  . multivitamin  1 tablet Oral QHS  . polyethylene glycol  17 g Oral Daily   Continuous Infusions: . sodium chloride Stopped (06/17/15 1453)   PRN Meds:.acetaminophen, cloNIDine, fentaNYL (SUBLIMAZE) injection, gi cocktail, ondansetron (ZOFRAN) IV  Antibiotics: Anti-infectives    Start     Dose/Rate Route Frequency Ordered Stop   06/17/15 1500  ceFAZolin (ANCEF) IVPB 2g/100 mL premix  Status:  Discontinued    Comments:  Pharmacy may adjust dosing strength, interval, or rate of medication as needed for optimal therapy for the patient  Send with patient on call to the OR.  Anesthesia to complete antibiotic administration <32mn [facility]   2 g 200 mL/hr over 30 Minutes Intravenous On call to O.R. 06/17/15 1019 06/18/15 0837        Assessment/Plan POD#2 laparoscopic cholecystectomy--Dr. BNinfa LindenPost op ileus -if he vomits again, will have NGT placed.  Orders placed. -continue mobilization, IS, anti-emetics -check LFTs in AM VTE prophylaxis-scd, heparin FEN-NPO Dispo-ileus   EErby Pian ANP-BC [facility]   06/19/2015 10:05 AM

## 2015-06-20 DIAGNOSIS — N186 End stage renal disease: Secondary | ICD-10-CM

## 2015-06-20 LAB — RENAL FUNCTION PANEL
ANION GAP: 15 (ref 5–15)
Albumin: 3.2 g/dL — ABNORMAL LOW (ref 3.5–5.0)
BUN: 21 mg/dL — ABNORMAL HIGH (ref 6–20)
CO2: 23 mmol/L (ref 22–32)
Calcium: 8.8 mg/dL — ABNORMAL LOW (ref 8.9–10.3)
Chloride: 93 mmol/L — ABNORMAL LOW (ref 101–111)
Creatinine, Ser: 6.03 mg/dL — ABNORMAL HIGH (ref 0.61–1.24)
GFR calc Af Amer: 10 mL/min — ABNORMAL LOW (ref >60)
GFR calc non Af Amer: 9 mL/min — ABNORMAL LOW (ref >60)
GLUCOSE: 73 mg/dL (ref 65–99)
POTASSIUM: 4.7 mmol/L (ref 3.5–5.1)
Phosphorus: 5.8 mg/dL — ABNORMAL HIGH (ref 2.5–4.6)
SODIUM: 131 mmol/L — AB (ref 135–145)

## 2015-06-20 LAB — HEPATIC FUNCTION PANEL
ALBUMIN: 3.2 g/dL — AB (ref 3.5–5.0)
ALK PHOS: 56 U/L (ref 38–126)
ALT: 11 U/L — ABNORMAL LOW (ref 17–63)
AST: 20 U/L (ref 15–41)
BILIRUBIN TOTAL: 1.1 mg/dL (ref 0.3–1.2)
Bilirubin, Direct: 0.2 mg/dL (ref 0.1–0.5)
Indirect Bilirubin: 0.9 mg/dL (ref 0.3–0.9)
Total Protein: 9.3 g/dL — ABNORMAL HIGH (ref 6.5–8.1)

## 2015-06-20 LAB — CBC
HEMATOCRIT: 33.2 % — AB (ref 39.0–52.0)
HEMOGLOBIN: 11.3 g/dL — AB (ref 13.0–17.0)
MCH: 30.5 pg (ref 26.0–34.0)
MCHC: 34 g/dL (ref 30.0–36.0)
MCV: 89.5 fL (ref 78.0–100.0)
Platelets: 145 10*3/uL — ABNORMAL LOW (ref 150–400)
RBC: 3.71 MIL/uL — ABNORMAL LOW (ref 4.22–5.81)
RDW: 12.9 % (ref 11.5–15.5)
WBC: 8.6 10*3/uL (ref 4.0–10.5)

## 2015-06-20 LAB — GLUCOSE, CAPILLARY
GLUCOSE-CAPILLARY: 76 mg/dL (ref 65–99)
GLUCOSE-CAPILLARY: 83 mg/dL (ref 65–99)
GLUCOSE-CAPILLARY: 91 mg/dL (ref 65–99)
Glucose-Capillary: 68 mg/dL (ref 65–99)
Glucose-Capillary: 104 mg/dL — ABNORMAL HIGH (ref 65–99)
Glucose-Capillary: 71 mg/dL (ref 65–99)

## 2015-06-20 MED ORDER — DEXTROSE 50 % IV SOLN
INTRAVENOUS | Status: AC
  Administered 2015-06-20: 25 mL
  Filled 2015-06-20: qty 50

## 2015-06-20 MED ORDER — BISACODYL 10 MG RE SUPP
10.0000 mg | Freq: Once | RECTAL | Status: AC
  Administered 2015-06-20: 10 mg via RECTAL
  Filled 2015-06-20: qty 1

## 2015-06-20 MED ORDER — LABETALOL HCL 5 MG/ML IV SOLN: 10.0000 mg | INTRAVENOUS | Status: DC | PRN

## 2015-06-20 NOTE — Progress Notes (Signed)
Family Medicine Teaching Service Daily Progress Note Intern Pager: [phone]  Patient name: Eric [name] Medical record number: [id number] Date of birth: [birth year] Age: 60 y.o. Gender: male  Primary Care Provider: [name], NP Consultants: None Code Status: Full  Pt Overview and Major Events to Date:   Assessment and Plan: Eric [name] is a 60 y.o. male presenting with chest pain/HTN . PMH is significant for HTN, HLD, Cardiac arrest, HFrEF, AICD, MI (2015), ESRD, Type 2 DM, CABG (1999), Hepatitis C (cirrhosis)  S/p cholecystectomy, POD2- 2/2 symptomatic cholelithiasis. No BM or flatus yet. Ileus. WBC - 8, Hgb is stable, LFT's normal.  - Fentanyl for pain.  - Zofran for nausea.  - NPO - Suppository per surgery this am.  - Ambulate - IS. - NGT if nausea / vomiting  Diffuse Numbness: Neuro exam normal, normal strength throughout. Likely neuropathy,though mixed neuro exam. Pt. Thinks it is related to phoslo. RPR, B12 normal.  - Stable. Continue to follow.   HFrEF: EF 45-50%, moderate concentric hypertrophy, Systolic pressure was mildly to moderately increased. PA peak pressure: 47 mm Hg (S). Volume well controlled with dialysis - MWF Dialysis for volume control - Continue Coreg, consider low dose ace inhibitor.   HTN BP 138/71 - hold home clonidine, hydralazine, Norvasc 10 mg, Coreg  - IV prn labetalol  ESRD: Nephrology following. M/W/F dialysis, refusing today.  - Dialysis if able today - AV fistula analyzed by vascular, good flow. - Renal panel daily  T2DM. Holding Glipizide, CBGs 80-100s  - CBGS ACQHS  - Sensitive sliding scale   FEN/GI:Carb modified diet  Prophylaxis: heparin SBQ  Disposition: pending improvement of ileus.   Subjective:  Feeling worse this morning. "heaviness" in his stomach. He says it is very uncomfortable. He says he feels that if he had anything by mouth he would throw up. No vomiting overnight. No gas or BM yesterday or  overnight. He is ambulating every hour.   Objective: Temp:  [97.2 F (36.2 C)-98.7 F (37.1 C)] 98.1 F (36.7 C) (05/28 0436) Pulse Rate:  [70-92] 86 (05/28 0733) Resp:  [11-23] 18 (05/28 0436) BP: (105-185)/(64-85) 143/76 mmHg (05/28 0733) SpO2:  [93 %-96 %] 96 % (05/28 0546) Weight:  [186 lb 4.6 oz (84.5 kg)-188 lb 4.4 oz (85.4 kg)] 186 lb 4.6 oz (84.5 kg) (05/27 1542) Physical Exam: General: Patient sitting in chair, NAD Cardiovascular: RRR, no murmurs  Respiratory: CTAB, no wheezing,  Abdomen: BS+, TTP this morning, distended more than yesterday. Discomfort with palpation. Surgical sites healing well and in tact.  Extremities: moving all extremities   Laboratory:  Recent Labs Lab 06/18/15 0309 06/19/15 0806 06/20/15 0217  WBC 7.5 12.5* 8.6  HGB 10.6* 10.5* 11.3*  HCT 31.6* 32.5* 33.2*  PLT 122* 141* 145*    Recent Labs Lab 06/14/15 1355  06/18/15 0309 06/19/15 0805 06/20/15 0217  NA 131*  < > 130* 126* 131*  K 4.4  < > 4.9 5.3* 4.7  CL 96*  < > 97* 91* 93*  CO2 22  < > 25 23 23   BUN 56*  < > 17 29* 21*  CREATININE 9.05*  < > 5.09* 7.29* 6.03*  CALCIUM 8.1*  < > 7.8* 8.6* 8.8*  PROT 9.2*  --   --   --  9.3*  BILITOT 0.6  --   --   --  1.1  ALKPHOS 66  --   --   --  56  ALT 18  --   --   --  11*  AST 11*  --   --   --  20  GLUCOSE 129*  < > 101* 117* 73  < > = values in this interval not displayed.     Recent Labs Lab 06/14/15 1355 06/14/15 1817 06/14/15 2130 06/15/15 0017  TROPONINI 0.03 0.04* 0.04* 0.04*     Imaging/Diagnostic Tests: Dg Abd Portable 2v  06/18/2015  CLINICAL DATA:  Abdominal distension EXAM: PORTABLE ABDOMEN - 2 VIEW COMPARISON:  CT abdomen 06/15/2015 FINDINGS: The bowel gas pattern is normal. There is no evidence of free air. No radio-opaque calculi or other significant radiographic abnormality is seen. IMPRESSION: Negative. Electronically Signed   By: Kathreen Devoid   On: 06/18/2015 18:27    Aquilla Hacker, MD 06/20/2015,  8:52 AM PGY-2, [place] Intern pager: [phone], text pages welcome

## 2015-06-20 NOTE — Progress Notes (Signed)
Pt started feeling pain and numbness all over after labs tried to get blood five times.  The pt's BP 185/83 as well.  Family Medicine was paged and the physician stated that they would look over him when they come this morning and to try to have him take a PO blood pressure medicine instead of an IV one.  Will continue to monitor the pt. Eric [name]

## 2015-06-20 NOTE — Progress Notes (Signed)
3 Days Post-Op  Subjective: No n/v. No flatus. No burping. Still bloated. Pain over "entire" body  Objective: Vital signs in last 24 hours: Temp:  [97.2 F (36.2 C)-98.7 F (37.1 C)] 98.1 F (36.7 C) (05/28 0436) Pulse Rate:  [70-92] 86 (05/28 0733) Resp:  [11-23] 18 (05/28 0436) BP: (105-185)/(64-85) 143/76 mmHg (05/28 0733) SpO2:  [93 %-96 %] 96 % (05/28 0546) Weight:  [84.5 kg (186 lb 4.6 oz)-85.4 kg (188 lb 4.4 oz)] 84.5 kg (186 lb 4.6 oz) (05/27 1542) Last BM Date: 06/15/15  Intake/Output from previous day: 05/27 0701 - 05/28 0700 In: 0  Out: 898  Intake/Output this shift:    Was walking in halls with walker and family member Nontoxic Protuberant, distended, incisions c/d/i; no BS No edema  Lab Results:   Recent Labs  06/19/15 0806 06/20/15 0217  WBC 12.5* 8.6  HGB 10.5* 11.3*  HCT 32.5* 33.2*  PLT 141* 145*   BMET  Recent Labs  06/19/15 0805 06/20/15 0217  NA 126* 131*  K 5.3* 4.7  CL 91* 93*  CO2 23 23  GLUCOSE 117* 73  BUN 29* 21*  CREATININE 7.29* 6.03*  CALCIUM 8.6* 8.8*   Hepatic Function Latest Ref Rng 06/20/2015 06/20/2015 06/19/2015  Total Protein 6.5 - 8.1 g/dL - 9.3(H) -  Albumin 3.5 - 5.0 g/dL 3.2(L) 3.2(L) 3.1(L)  AST 15 - 41 U/L - 20 -  ALT 17 - 63 U/L - 11(L) -  Alk Phosphatase 38 - 126 U/L - 56 -  Total Bilirubin 0.3 - 1.2 mg/dL - 1.1 -  Bilirubin, Direct 0.1 - 0.5 mg/dL - 0.2 -     PT/INR No results for input(s): LABPROT, INR in the last 72 hours. ABG No results for input(s): PHART, HCO3 in the last 72 hours.  Invalid input(s): PCO2, PO2  Studies/Results: Dg Abd Portable 2v  06/18/2015  CLINICAL DATA:  Abdominal distension EXAM: PORTABLE ABDOMEN - 2 VIEW COMPARISON:  CT abdomen 06/15/2015 FINDINGS: The bowel gas pattern is normal. There is no evidence of free air. No radio-opaque calculi or other significant radiographic abnormality is seen. IMPRESSION: Negative. Electronically Signed   By: Kathreen Devoid   On: 06/18/2015  18:27    Anti-infectives: Anti-infectives    Start     Dose/Rate Route Frequency Ordered Stop   06/17/15 1500  ceFAZolin (ANCEF) IVPB 2g/100 mL premix  Status:  Discontinued    Comments:  Pharmacy may adjust dosing strength, interval, or rate of medication as needed for optimal therapy for the patient  Send with patient on call to the OR.  Anesthesia to complete antibiotic administration <66mn prior [facility]   2 g 200 mL/hr over 30 Minutes Intravenous On call to O.R. 06/17/15 1019 06/18/15 0837      Assessment/Plan: POD#3 laparoscopic cholecystectomy--Dr. BNinfa LindenPost op ileus -if he vomits, will have NGT placed. Orders placed. -continue mobilization, IS, anti-emetics -labs ok -will try suppository VTE prophylaxis-scd, heparin FEN-NPO Dispo-ileus, discussed with pt and family member  ELeighton Ruff WRedmond Pulling MD, FACS General, Bariatric, & Minimally Invasive Surgery [facility] [place]   LOS: 5 days    WGayland Curry5/28/2017

## 2015-06-20 NOTE — Progress Notes (Signed)
Subjective: did finally agree to a little HD yest- removed 900 ccs BP highish- afebrile- still with ileus- trying to walk in halls  Objective Vital signs in last 24 hours: Filed Vitals:   06/19/15 2006 06/20/15 0436 06/20/15 0546 06/20/15 0733  BP: 148/78 157/83 185/83 143/76  Pulse: 88 88 92 86  Temp: 98.7 F (37.1 C) 98.1 F (36.7 C)    TempSrc: Oral Oral    Resp: 20 18    Weight:      SpO2: 94% 93% 96%    Weight change:   Intake/Output Summary (Last 24 hours) at 06/20/15 [id number] Last data filed at 06/19/15 1542  Gross per 24 hour  Intake      0 ml  Output    898 ml  Net   -898 ml    Dialysis Orders: [facility] MWF 4 hr 15 min 180 400/800 2 K 2.25 Ca EDW 81.5 right upper AVF maturing   - placed 2/23 and right IJ heparin 7000 calcitriol 1 no ESA or Fe Recent labs: Hgb 12 5/17 44% sat iPTH 802 - prev on sensipar only - just added phoslo 2 ac as P up on May labs  Assessment/Plan: 1. symptomatic cholelithiasis - s/p lap chole 5/25- seems to have ileus- management per surg and primary team 2. ESRD - normally MWF via PC even though AVF seems ready (placed 2/23)-  Appreciate VVS input - only had had 30 min Monday as OP -had full treatment Tuesday and Thursday.  Short HD yest (Sat) then Monday to get back on schedule.  Appreciate VVS- no contraindication to AVF use- likely will not agree with other discomforts going on but have green light for sticking in OP setting 3. Hypertension/volume - CXR neg for volume - only on norvasc and coreg -volume can come down some  Plan to challenge with next HD hopefully today, if not Monday- BP was fine- now up- possibly due to situation/pain 4. Anemia - hgb down some  - resumed ESA- will follow 5. Metabolic bone disease - Continue binders(phoslo)/sensipar/calcitriol ^ to 1.0- HD  6. Nutrition - on CL -added vits 7. Hx CABG s/p ICD 8. Hyponatremia-  Challenging volume, is better     Benedict Kue A    Labs: Basic Metabolic Panel:  Recent  Labs Lab 06/18/15 0309 06/19/15 0805 06/20/15 0217  NA 130* 126* 131*  K 4.9 5.3* 4.7  CL 97* 91* 93*  CO2 25 23 23   GLUCOSE 101* 117* 73  BUN 17 29* 21*  CREATININE 5.09* 7.29* 6.03*  CALCIUM 7.8* 8.6* 8.8*  PHOS 5.1* 6.2* 5.8*   Liver Function Tests:  Recent Labs Lab 06/14/15 1355  06/18/15 0309 06/19/15 0805 06/20/15 0217  AST 11*  --   --   --  20  ALT 18  --   --   --  11*  ALKPHOS 66  --   --   --  56  BILITOT 0.6  --   --   --  1.1  PROT 9.2*  --   --   --  9.3*  ALBUMIN 3.2*  < > 2.9* 3.1* 3.2*  3.2*  < > = values in this interval not displayed.  Recent Labs Lab 06/14/15 1816  LIPASE 195*   No results for input(s): AMMONIA in the last 168 hours. CBC:  Recent Labs Lab 06/17/15 0332 06/17/15 0845 06/18/15 0309 06/19/15 0806 06/20/15 0217  WBC 5.8 5.0 7.5 12.5* 8.6  HGB 9.9* 10.4* 10.6* 10.5* 11.3*  HCT 29.1* 30.3* 31.6* 32.5* 33.2*  MCV 86.1 84.6 87.3 88.8 89.5  PLT 120* 131* 122* 141* 145*   Cardiac Enzymes:  Recent Labs Lab 06/14/15 1355 06/14/15 1817 06/14/15 2130 06/15/15 0017  TROPONINI 0.03 0.04* 0.04* 0.04*   CBG:  Recent Labs Lab 06/19/15 1131 06/19/15 1655 06/19/15 2112 06/20/15 0624 06/20/15 0716  GLUCAP 86 87 92 68 104*    Iron Studies: No results for input(s): IRON, TIBC, TRANSFERRIN, FERRITIN in the last 72 hours. Studies/Results: Dg Abd Portable 2v  06/18/2015  CLINICAL DATA:  Abdominal distension EXAM: PORTABLE ABDOMEN - 2 VIEW COMPARISON:  CT abdomen 06/15/2015 FINDINGS: The bowel gas pattern is normal. There is no evidence of free air. No radio-opaque calculi or other significant radiographic abnormality is seen. IMPRESSION: Negative. Electronically Signed   By: Kathreen Devoid   On: 06/18/2015 18:27   Medications: Infusions: . sodium chloride Stopped (06/17/15 1453)    Scheduled Medications: . amLODipine  10 mg Oral Daily  . calcitRIOL  1 mcg Oral Q M,W,F-HD  . calcium acetate  1,334 mg Oral TID WC  .  carvedilol  6.25 mg Oral BID WC  . cinacalcet  30 mg Oral Q supper  . [START ON 06/25/2015] darbepoetin (ARANESP) injection - DIALYSIS  100 mcg Intravenous Q Fri-HD  . docusate sodium  100 mg Oral BID  . gi cocktail  30 mL Oral Once  . heparin  5,000 Units Subcutaneous Q8H  . insulin aspart  0-9 Units Subcutaneous TID WC  . isosorbide mononitrate  60 mg Oral Daily  . multivitamin  1 tablet Oral QHS  . polyethylene glycol  17 g Oral Daily    have reviewed scheduled and prn medications.  Physical Exam: General: walking in hall Heart: RRR Lungs: clear Abdomen: more distended Extremities: no overt peripheral edema Dialysis Access: right sided PC and right AVF good thrill and bruit     06/20/2015,8:11 AM  LOS: 5 days

## 2015-06-21 ENCOUNTER — Inpatient Hospital Stay (HOSPITAL_COMMUNITY): Payer: Medicaid Other

## 2015-06-21 DIAGNOSIS — K567 Ileus, unspecified: Secondary | ICD-10-CM | POA: Insufficient documentation

## 2015-06-21 DIAGNOSIS — R140 Abdominal distension (gaseous): Secondary | ICD-10-CM | POA: Insufficient documentation

## 2015-06-21 LAB — RENAL FUNCTION PANEL
ALBUMIN: 3 g/dL — AB (ref 3.5–5.0)
Anion gap: 14 (ref 5–15)
BUN: 31 mg/dL — AB (ref 6–20)
CHLORIDE: 93 mmol/L — AB (ref 101–111)
CO2: 26 mmol/L (ref 22–32)
CREATININE: 8.08 mg/dL — AB (ref 0.61–1.24)
Calcium: 8.8 mg/dL — ABNORMAL LOW (ref 8.9–10.3)
GFR, EST AFRICAN AMERICAN: 7 mL/min — AB (ref >60)
GFR, EST NON AFRICAN AMERICAN: 6 mL/min — AB (ref >60)
Glucose, Bld: 99 mg/dL (ref 65–99)
PHOSPHORUS: 6.8 mg/dL — AB (ref 2.5–4.6)
POTASSIUM: 4.9 mmol/L (ref 3.5–5.1)
Sodium: 133 mmol/L — ABNORMAL LOW (ref 135–145)

## 2015-06-21 LAB — CBC
HEMATOCRIT: 34.3 % — AB (ref 39.0–52.0)
Hemoglobin: 11.2 g/dL — ABNORMAL LOW (ref 13.0–17.0)
MCH: 29.6 pg (ref 26.0–34.0)
MCHC: 32.7 g/dL (ref 30.0–36.0)
MCV: 90.5 fL (ref 78.0–100.0)
PLATELETS: 184 10*3/uL (ref 150–400)
RBC: 3.79 MIL/uL — AB (ref 4.22–5.81)
RDW: 13 % (ref 11.5–15.5)
WBC: 10.9 10*3/uL — AB (ref 4.0–10.5)

## 2015-06-21 LAB — GLUCOSE, CAPILLARY
GLUCOSE-CAPILLARY: 66 mg/dL (ref 65–99)
GLUCOSE-CAPILLARY: 79 mg/dL (ref 65–99)
GLUCOSE-CAPILLARY: 89 mg/dL (ref 65–99)
Glucose-Capillary: 81 mg/dL (ref 65–99)
Glucose-Capillary: 78 mg/dL (ref 65–99)
Glucose-Capillary: 59 mg/dL — ABNORMAL LOW (ref 65–99)

## 2015-06-21 MED ORDER — LORAZEPAM 2 MG/ML IJ SOLN: 0.5000 mg | Freq: Once | INTRAMUSCULAR | Status: DC

## 2015-06-21 MED ORDER — CALCITRIOL 1 MCG/ML PO SOLN
1.0000 ug | ORAL | Status: DC
  Administered 2015-06-30: 1 ug
  Filled 2015-06-21 (×5): qty 1

## 2015-06-21 MED ORDER — DEXTROSE 50 % IV SOLN
INTRAVENOUS | Status: AC
  Administered 2015-06-21: 15 mL
  Filled 2015-06-21: qty 50

## 2015-06-21 MED ORDER — HEPARIN SODIUM (PORCINE) 1000 UNIT/ML DIALYSIS: 20.0000 [IU]/kg | INTRAMUSCULAR | Status: DC | PRN

## 2015-06-21 MED ORDER — KETOROLAC TROMETHAMINE 15 MG/ML IJ SOLN
15.0000 mg | Freq: Four times a day (QID) | INTRAMUSCULAR | Status: DC | PRN
  Administered 2015-06-21 (×2): 15 mg via INTRAVENOUS
  Administered 2015-06-24: 30 mg via INTRAVENOUS
  Filled 2015-06-21: qty 1
  Filled 2015-06-21: qty 2
  Filled 2015-06-21: qty 1
  Filled 2015-06-21 (×2): qty 2

## 2015-06-21 MED ORDER — OXYCODONE HCL 5 MG PO TABS: 5.0000 mg | ORAL_TABLET | ORAL | Status: DC | PRN

## 2015-06-21 MED ORDER — LORAZEPAM 0.5 MG PO TABS: 0.5000 mg | ORAL_TABLET | Freq: Once | ORAL | Status: DC

## 2015-06-21 MED ORDER — DEXTROSE 50 % IV SOLN
INTRAVENOUS | Status: AC
  Administered 2015-06-21: 10 mL
  Filled 2015-06-21: qty 50

## 2015-06-21 MED ORDER — FENTANYL CITRATE (PF) 100 MCG/2ML IJ SOLN
INTRAMUSCULAR | Status: AC
  Filled 2015-06-21: qty 2

## 2015-06-21 NOTE — Progress Notes (Signed)
Patient refusing his medications due to abdominal pain.

## 2015-06-21 NOTE — Progress Notes (Signed)
PT Cancellation Note  Patient Details Name: Eric [name] MRN: [id number] DOB: 08-07-1955   Cancelled Treatment:    Reason Eval/Treat Not Completed: Medical issues which prohibited therapy. Pt had episode in bathroom with nursing earlier. Will defer and follow up tomorrow.   Shavonn Convey 06/21/2015, 3:49 PM [facility] PT [phone]

## 2015-06-21 NOTE — Progress Notes (Signed)
Called to evaluate patient.  As per Rn was in bathroom and had a staring spell and would not respond to staff.  On my arrival to patients room, patient sitting in chair moaning in pain.  VSS.  Rn at bedside.  Patient states he is having lots of pain from his abd.  Abd is very large, distended and very tender to touch.  Patient states he has not had a bowel movement since last week.  Feels like he needs to belch but can't.  He is dry heaving and anxious.  Family at bedside and is also anxious.  Md aware.  Rn to call if assistance needed

## 2015-06-21 NOTE — Progress Notes (Addendum)
  [facility] Progress Note   Subjective: "my stomach is too big", not keeping anything down, no good BM yet had two "tiny" BM's, taking narcotic pain meds  Filed Vitals:   06/21/15 0802 06/21/15 0810 06/21/15 0830 06/21/15 0900  BP: 172/100 171/103 163/101 152/83  Pulse: 85 84 85 83  Temp: 98.2 F (36.8 C)     TempSrc: Oral     Resp: 24 18 17    Weight: 84 kg (185 lb 3 oz)     SpO2: 94%       Inpatient medications: . amLODipine  10 mg Oral Daily  . calcitRIOL  1 mcg Oral Q M,W,F-HD  . calcium acetate  1,334 mg Oral TID WC  . carvedilol  6.25 mg Oral BID WC  . cinacalcet  30 mg Oral Q supper  . [START ON 06/25/2015] darbepoetin (ARANESP) injection - DIALYSIS  100 mcg Intravenous Q Fri-HD  . docusate sodium  100 mg Oral BID  . fentaNYL      . gi cocktail  30 mL Oral Once  . heparin  5,000 Units Subcutaneous Q8H  . insulin aspart  0-9 Units Subcutaneous TID WC  . isosorbide mononitrate  60 mg Oral Daily  . multivitamin  1 tablet Oral QHS  . polyethylene glycol  17 g Oral Daily     acetaminophen, cloNIDine, gi cocktail, heparin, ketorolac, ondansetron (ZOFRAN) IV  Exam: Alert, no distress No jvd Chest clear bilat RRR  Dialysis: MWF [facility]  4h 96min   81.5kg  2/2.25 bath  RUA AVF (maturing) / R IJ cath Hep 7000 Calc 1ug Last pth 802  Hb 12  44%sat      Assessment: 1 Cholelithiasis, s/p lap chole 5/25, w post-op ileus, getting narcotics 2 ESRD HD mwf, ok to use AVF per VVS, pt has been refusing up to now 3 HTN bp's up on amlod/ coreg 4 Hx CABG/ ICD/ CM 5 HypoNa+ improving w HD 6 ^PTH cont med rx 7 Vol +2-3 kg   Plan - stop narcotics, try IV/PO nsaid's, have d/w prim team. HD today   Kelly Splinter MD [facility] pager [phone]    cell [phone] 06/21/2015, 9:23 AM    Recent Labs Lab 06/19/15 0805 06/20/15 0217 06/21/15 0217  NA 126* 131* 133*  K 5.3* 4.7 4.9  CL 91* 93* 93*  CO2 23 23 26   GLUCOSE 117* 73 99  BUN 29* 21* 31*   CREATININE 7.29* 6.03* 8.08*  CALCIUM 8.6* 8.8* 8.8*  PHOS 6.2* 5.8* 6.8*    Recent Labs Lab 06/14/15 1355  06/19/15 0805 06/20/15 0217 06/21/15 0217  AST 11*  --   --  20  --   ALT 18  --   --  11*  --   ALKPHOS 66  --   --  56  --   BILITOT 0.6  --   --  1.1  --   PROT 9.2*  --   --  9.3*  --   ALBUMIN 3.2*  < > 3.1* 3.2*  3.2* 3.0*  < > = values in this interval not displayed.  Recent Labs Lab 06/19/15 0806 06/20/15 0217 06/21/15 0217  WBC 12.5* 8.6 10.9*  HGB 10.5* 11.3* 11.2*  HCT 32.5* 33.2* 34.3*  MCV 88.8 89.5 90.5  PLT 141* 145* 184

## 2015-06-21 NOTE — Progress Notes (Signed)
PT Cancellation Note  Patient Details Name: Eric [name] MRN: [id number] DOB: 01-24-1955   Cancelled Treatment:    Reason Eval/Treat Not Completed: Patient at procedure or test/unavailable (HD). Will re-attempt later.   Barbi Kumagai 06/21/2015, 8:30 AM  [facility] PT [phone]

## 2015-06-21 NOTE — Progress Notes (Signed)
4 Days Post-Op  Subjective: Some with nausea.  No flatus.  BM after suppository.  Bloated.  Objective: Vital signs in last 24 hours: Temp:  [98.2 F (36.8 C)-98.6 F (37 C)] 98.2 F (36.8 C) (05/29 0802) Pulse Rate:  [82-89] 85 (05/29 1130) Resp:  [14-24] 22 (05/29 1130) BP: (113-172)/(72-103) 137/79 mmHg (05/29 1130) SpO2:  [93 %-96 %] 94 % (05/29 0802) Weight:  [84 kg (185 lb 3 oz)] 84 kg (185 lb 3 oz) (05/29 0802) Last BM Date: 06/20/15  Intake/Output from previous day:   Intake/Output this shift:    PE: General- In NAD Abdomen-firm, distended, quiet, incisions clean, infraumbilical bruising  Lab Results:   Recent Labs  06/20/15 0217 06/21/15 0217  WBC 8.6 10.9*  HGB 11.3* 11.2*  HCT 33.2* 34.3*  PLT 145* 184   BMET  Recent Labs  06/20/15 0217 06/21/15 0217  NA 131* 133*  K 4.7 4.9  CL 93* 93*  CO2 23 26  GLUCOSE 73 99  BUN 21* 31*  CREATININE 6.03* 8.08*  CALCIUM 8.8* 8.8*   PT/INR No results for input(s): LABPROT, INR in the last 72 hours. Comprehensive Metabolic Panel:    Component Value Date/Time   NA 133* 06/21/2015 0217   NA 131* 06/20/2015 0217   K 4.9 06/21/2015 0217   K 4.7 06/20/2015 0217   CL 93* 06/21/2015 0217   CL 93* 06/20/2015 0217   CO2 26 06/21/2015 0217   CO2 23 06/20/2015 0217   BUN 31* 06/21/2015 0217   BUN 21* 06/20/2015 0217   CREATININE 8.08* 06/21/2015 0217   CREATININE 6.03* 06/20/2015 0217   GLUCOSE 99 06/21/2015 0217   GLUCOSE 73 06/20/2015 0217   CALCIUM 8.8* 06/21/2015 0217   CALCIUM 8.8* 06/20/2015 0217   AST 20 06/20/2015 0217   AST 11* 06/14/2015 1355   ALT 11* 06/20/2015 0217   ALT 18 06/14/2015 1355   ALKPHOS 56 06/20/2015 0217   ALKPHOS 66 06/14/2015 1355   BILITOT 1.1 06/20/2015 0217   BILITOT 0.6 06/14/2015 1355   PROT 9.3* 06/20/2015 0217   PROT 9.2* 06/14/2015 1355   ALBUMIN 3.0* 06/21/2015 0217   ALBUMIN 3.2* 06/20/2015 0217   ALBUMIN 3.2* 06/20/2015 0217     Studies/Results: No  results found.  Anti-infectives: Anti-infectives    Start     Dose/Rate Route Frequency Ordered Stop   06/17/15 1500  ceFAZolin (ANCEF) IVPB 2g/100 mL premix  Status:  Discontinued    Comments:  Pharmacy may adjust dosing strength, interval, or rate of medication as needed for optimal therapy for the patient  Send with patient on call to the OR.  Anesthesia to complete antibiotic administration <103min prior to incision per [facility].   2 g 200 mL/hr over 30 Minutes Intravenous On call to O.R. 06/17/15 1019 06/18/15 0837      Assessment Active Problems:    S/p lap chole 5/25-postop ileus continues     ESRD (end stage renal disease) ([place])    LOS: 6 days   Plan: Check x-rays today.     Taavi Hoose J 06/21/2015

## 2015-06-21 NOTE — Progress Notes (Signed)
Hemodialysis- Pt tolerated tx well. Had some cramping halfway. UF off and saline given. MD aware. Goal decreased. Total UF 1.1 L d/t issue. Pt currently has no complaints other than not being able to eat. Report called to 2W

## 2015-06-21 NOTE — Care Management Note (Signed)
Case Management Note  Patient Details  Name: Kerman Pfost MRN: [id number] Date of Birth: 16-Oct-1955  Subjective/Objective: 60 yo M admitted with CP and symptomatic cholelithiasis. s/p lap chole.    Action/Plan: CM referral to assist with [place] needs, PT is recommending a RW   Expected Discharge Date:                  Expected Discharge Plan:  Home/Self Care  In-House Referral:     Discharge planning Services  CM Consult  Post Acute Care Choice:    Choice offered to:     DME Arranged:    DME Agency:     HH Arranged:    [place] Agency:     Status of Service:  In process, will continue to follow  Medicare Important Message Given:    Date Medicare IM Given:    Medicare IM give by:    Date Additional Medicare IM Given:    Additional Medicare Important Message give by:     If discussed at [place] of Stay Meetings, dates discussed:    Additional Comments: met with pt and wife at bedside. D/C plan is to return home with the support of his wife and children. Wife reports that he has a walker at home.  Norina Buzzard, RN 06/21/2015, 1:47 PM

## 2015-06-21 NOTE — Progress Notes (Deleted)
  [facility] Progress Note   Subjective: "my stomach is too big", not keeping anything down, no good BM yet had two "tiny" BM's, taking narcotic pain meds  Filed Vitals:   06/21/15 0802 06/21/15 0810 06/21/15 0830 06/21/15 0900  BP: 172/100 171/103 163/101 152/83  Pulse: 85 84 85 83  Temp: 98.2 F (36.8 C)     TempSrc: Oral     Resp: 24 18 17    Weight: 84 kg (185 lb 3 oz)     SpO2: 94%       Inpatient medications: . amLODipine  10 mg Oral Daily  . calcitRIOL  1 mcg Oral Q M,W,F-HD  . calcium acetate  1,334 mg Oral TID WC  . carvedilol  6.25 mg Oral BID WC  . cinacalcet  30 mg Oral Q supper  . [START ON 06/25/2015] darbepoetin (ARANESP) injection - DIALYSIS  100 mcg Intravenous Q Fri-HD  . docusate sodium  100 mg Oral BID  . fentaNYL      . gi cocktail  30 mL Oral Once  . heparin  5,000 Units Subcutaneous Q8H  . insulin aspart  0-9 Units Subcutaneous TID WC  . isosorbide mononitrate  60 mg Oral Daily  . multivitamin  1 tablet Oral QHS  . polyethylene glycol  17 g Oral Daily   . sodium chloride Stopped (06/17/15 1453)   acetaminophen, cloNIDine, gi cocktail, heparin, labetalol, ondansetron (ZOFRAN) IV, oxyCODONE  Exam: Alert, no distress No jvd Chest clear bilat RRR  Dialysis: MWF [facility]  4h 64min   81.5kg  2/2.25 bath  RUA AVF (maturint) / R IJ cath Hep 7000 Calc 1ug Last pth 802  Hb 12  44%sat      Assessment: 1 Cholelithiasis, s/p lap chole 5/25, w post-op ileus, getting narcotics 2 ESRD HD mwf, ok to use AVF per VVS, pt has been refusing up to now 3 HTN bp's up on amlod/ coreg 4 Hx CABG/ ICD/ CM 5 HypoNa+ improving w HD 6 ^PTH cont med rx 7 Vol +2-3 kg   Plan - stop narcotics, try IV/PO nsaid's, have d/w prim team. HD today   Kelly Splinter MD [facility] pager [phone]    cell [phone] 06/21/2015, 9:08 AM    Recent Labs Lab 06/19/15 0805 06/20/15 0217 06/21/15 0217  NA 126* 131* 133*  K 5.3* 4.7 4.9  CL 91* 93* 93*   CO2 23 23 26   GLUCOSE 117* 73 99  BUN 29* 21* 31*  CREATININE 7.29* 6.03* 8.08*  CALCIUM 8.6* 8.8* 8.8*  PHOS 6.2* 5.8* 6.8*    Recent Labs Lab 06/14/15 1355  06/19/15 0805 06/20/15 0217 06/21/15 0217  AST 11*  --   --  20  --   ALT 18  --   --  11*  --   ALKPHOS 66  --   --  56  --   BILITOT 0.6  --   --  1.1  --   PROT 9.2*  --   --  9.3*  --   ALBUMIN 3.2*  < > 3.1* 3.2*  3.2* 3.0*  < > = values in this interval not displayed.  Recent Labs Lab 06/19/15 0806 06/20/15 0217 06/21/15 0217  WBC 12.5* 8.6 10.9*  HGB 10.5* 11.3* 11.2*  HCT 32.5* 33.2* 34.3*  MCV 88.8 89.5 90.5  PLT 141* 145* 184

## 2015-06-21 NOTE — Progress Notes (Signed)
Family Medicine Teaching Service Daily Progress Note Intern Pager: [phone]  Patient name: Eric [name] Medical record number: [id number] Date of birth: [birth year] Age: 60 y.o. Gender: male  Primary Care Provider: [name], NP Consultants: None Code Status: Full  Pt Overview and Major Events to Date:   Assessment and Plan: Eric [name] is a 60 y.o. male presenting with chest pain/HTN . PMH is significant for HTN, HLD, Cardiac arrest, HFrEF, AICD, MI (2015), ESRD, Type 2 DM, CABG (1999), Hepatitis C (cirrhosis)  S/p cholecystectomy, POD3, now with post-op ileus.. Patient with only 2 small bowel movements yesterday following suppository. WBC - 8, Hgb is stable, LFT's normal.  - D/C'd Fentanyl for pain.  - Zofran for nausea.  - Toradol 15- 30 mg every 6 hours prn  - Ambulate  Diffuse Numbness: Neuro exam normal, normal strength throughout. Likely neuropathy,though mixed neuro exam. Pt. Thinks it is related to phoslo. RPR, B12 normal.  - Stable. Continue to follow.   HFrEF: EF 45-50%, moderate concentric hypertrophy, Systolic pressure was mildly to moderately increased. PA peak pressure: 47 mm Hg (S). Volume well controlled with dialysis - MWF Dialysis for volume control - Continue Coreg, consider low dose ace inhibitor.   HTN BP 138/71 - hold home clonidine, hydralazine, Norvasc 10 mg, Coreg  - IV prn labetalol  ESRD: Nephrology following. M/W/F dialysis, refusing today.  - Dialysis if able today - AV fistula analyzed by vascular, good flow. - Renal panel daily  T2DM. Holding Glipizide, CBGs 80-100s  - CBGS ACQHS  - Sensitive sliding scale   FEN/GI:Carb modified diet  Prophylaxis: heparin SBQ  Disposition: pending improvement of ileus.   Subjective:  Patient continues to be distended. Has had 2 small bowel movements. Still having pain at times. Discussed changing pain medication and stopping opioids.   Objective: Temp:  [98.2 F (36.8 C)-98.6 F (37  C)] 98.2 F (36.8 C) (05/29 0802) Pulse Rate:  [82-89] 85 (05/29 1130) Resp:  [14-24] 22 (05/29 1130) BP: (113-172)/(72-103) 137/79 mmHg (05/29 1130) SpO2:  [93 %-96 %] 94 % (05/29 0802) Weight:  [185 lb 3 oz (84 kg)] 185 lb 3 oz (84 kg) (05/29 0802) Physical Exam: General: Patient sitting in chair, NAD Cardiovascular: RRR, no murmurs  Respiratory: CTAB, no wheezing,  Abdomen: BS+, Distended, slight ttp in lower quadrant.  Extremities: moving all extremities   Laboratory:  Recent Labs Lab 06/19/15 0806 06/20/15 0217 06/21/15 0217  WBC 12.5* 8.6 10.9*  HGB 10.5* 11.3* 11.2*  HCT 32.5* 33.2* 34.3*  PLT 141* 145* 184    Recent Labs Lab 06/14/15 1355  06/19/15 0805 06/20/15 0217 06/21/15 0217  NA 131*  < > 126* 131* 133*  K 4.4  < > 5.3* 4.7 4.9  CL 96*  < > 91* 93* 93*  CO2 22  < > 23 23 26   BUN 56*  < > 29* 21* 31*  CREATININE 9.05*  < > 7.29* 6.03* 8.08*  CALCIUM 8.1*  < > 8.6* 8.8* 8.8*  PROT 9.2*  --   --  9.3*  --   BILITOT 0.6  --   --  1.1  --   ALKPHOS 66  --   --  56  --   ALT 18  --   --  11*  --   AST 11*  --   --  20  --   GLUCOSE 129*  < > 117* 73 99  < > = values in this interval not displayed.  Recent Labs Lab 06/14/15 1355 06/14/15 1817 06/14/15 2130 06/15/15 0017  TROPONINI 0.03 0.04* 0.04* 0.04*     Imaging/Diagnostic Tests: No results found.  Eric [name] Eric Media, MD 06/21/2015, 11:48 AM PGY-1, [place] Intern pager: [phone], text pages welcome

## 2015-06-22 ENCOUNTER — Inpatient Hospital Stay (HOSPITAL_COMMUNITY): Payer: Medicaid Other

## 2015-06-22 ENCOUNTER — Encounter (HOSPITAL_COMMUNITY): Payer: Self-pay | Admitting: General Practice

## 2015-06-22 LAB — GLUCOSE, CAPILLARY
GLUCOSE-CAPILLARY: 74 mg/dL (ref 65–99)
Glucose-Capillary: 75 mg/dL (ref 65–99)
Glucose-Capillary: 72 mg/dL (ref 65–99)
Glucose-Capillary: 125 mg/dL — ABNORMAL HIGH (ref 65–99)
Glucose-Capillary: 166 mg/dL — ABNORMAL HIGH (ref 65–99)

## 2015-06-22 LAB — RENAL FUNCTION PANEL
Albumin: 3.1 g/dL — ABNORMAL LOW (ref 3.5–5.0)
Anion gap: 14 (ref 5–15)
BUN: 17 mg/dL (ref 6–20)
CHLORIDE: 92 mmol/L — AB (ref 101–111)
CO2: 29 mmol/L (ref 22–32)
CREATININE: 5.66 mg/dL — AB (ref 0.61–1.24)
Calcium: 8.6 mg/dL — ABNORMAL LOW (ref 8.9–10.3)
GFR calc Af Amer: 11 mL/min — ABNORMAL LOW (ref >60)
GFR calc non Af Amer: 10 mL/min — ABNORMAL LOW (ref >60)
GLUCOSE: 69 mg/dL (ref 65–99)
Phosphorus: 5.1 mg/dL — ABNORMAL HIGH (ref 2.5–4.6)
Potassium: 3.8 mmol/L (ref 3.5–5.1)
Sodium: 135 mmol/L (ref 135–145)

## 2015-06-22 LAB — CBC
HCT: 34.3 % — ABNORMAL LOW (ref 39.0–52.0)
Hemoglobin: 11.1 g/dL — ABNORMAL LOW (ref 13.0–17.0)
MCH: 29.3 pg (ref 26.0–34.0)
MCHC: 32.4 g/dL (ref 30.0–36.0)
MCV: 90.5 fL (ref 78.0–100.0)
PLATELETS: 205 10*3/uL (ref 150–400)
RBC: 3.79 MIL/uL — ABNORMAL LOW (ref 4.22–5.81)
RDW: 13.1 % (ref 11.5–15.5)
WBC: 9.5 10*3/uL (ref 4.0–10.5)

## 2015-06-22 MED ORDER — BISACODYL 10 MG RE SUPP: 10.0000 mg | Freq: Once | RECTAL | Status: DC

## 2015-06-22 MED ORDER — DIATRIZOATE MEGLUMINE & SODIUM 66-10 % PO SOLN
ORAL | Status: AC
  Filled 2015-06-22: qty 90

## 2015-06-22 MED ORDER — GLYCERIN (LAXATIVE) 2.1 G RE SUPP
1.0000 | Freq: Once | RECTAL | Status: DC
  Filled 2015-06-22: qty 1

## 2015-06-22 MED ORDER — DIATRIZOATE MEGLUMINE & SODIUM 66-10 % PO SOLN
90.0000 mL | Freq: Once | ORAL | Status: AC
  Administered 2015-06-22: 90 mL via NASOGASTRIC
  Filled 2015-06-22: qty 90

## 2015-06-22 MED ORDER — DEXTROSE-NACL 5-0.45 % IV SOLN
INTRAVENOUS | Status: DC
  Administered 2015-06-22 – 2015-06-24 (×2): via INTRAVENOUS

## 2015-06-22 MED ORDER — DIATRIZOATE MEGLUMINE & SODIUM 66-10 % PO SOLN: 90.0000 mL | Freq: Once | ORAL | Status: DC

## 2015-06-22 NOTE — Progress Notes (Addendum)
Pt. And family (daughter) became angry when i said in needed to refer to PT notes about ambulation before patient  Ambulates, and that he will need supervision bu staff due to the 2 episodes he has had with stiffing up that can cause his knees to buckle. MD disconnected pt. NG tube from suction, and said daughter can walk him. I explained to MD.

## 2015-06-22 NOTE — Progress Notes (Signed)
Patient ID: Eric [name], male   DOB: [birth year], 60 y.o.   MRN: [id number]     [address] [place], [place] [address]    Phone: [phone] FAX: [phone]     Subjective: No flatus. No n/v.  996m ngt output.  HAS NOT BEEN WALKING.   Objective:  Vital signs:  Filed Vitals:   06/21/15 1219 06/21/15 1413 06/21/15 2038 06/22/15 0448  BP: 160/85 143/79 112/61 130/72  Pulse: 86 89 85 87  Temp: 98.1 F (36.7 C) 98.2 F (36.8 C) 98.1 F (36.7 C) 98.7 F (37.1 C)  TempSrc: Oral Oral Oral Oral  Resp: '19  16 20  '$ Weight: 82.6 kg (182 lb 1.6 oz)   81.012 kg (178 lb 9.6 oz)  SpO2: 94% 98% 97%     Last BM Date: 06/15/15  Intake/Output   Yesterday:  05/29 0701 - 05/30 0700 In: -  Out: 2050 [Emesis/NG output:950] This shift:  Total I/O In: -  Out: 950 [Emesis/NG output:950]  Physical Exam: General: Pt awake/alert/oriented x4 in no acute distress  Abdomen: Soft.  distended.  Mildly tender at incisions only.  No evidence of peritonitis.  No incarcerated hernias.    Problem List:   Active Problems:   Chest pain   Numbness   RUQ abdominal pain   Gallbladder calculus   Pancreatitis   Gallstones   ESRD (end stage renal disease) (HCC)   Abdominal distension   Ileus (HCC)    Results:   Labs: Results for orders placed or performed during the hospital encounter of 06/14/15 (from the past 48 hour(s))  Glucose, capillary     Status: None   Collection Time: 06/20/15 11:32 AM  Result Value Ref Range   Glucose-Capillary 71 65 - 99 mg/dL   Comment 1 Notify RN    Comment 2 Document in Chart   Glucose, capillary     Status: None   Collection Time: 06/20/15  4:17 PM  Result Value Ref Range   Glucose-Capillary 91 65 - 99 mg/dL   Comment 1 Notify RN    Comment 2 Document in Chart   Glucose, capillary     Status: None   Collection Time: 06/20/15  9:38 PM  Result Value Ref Range   Glucose-Capillary 83  65 - 99 mg/dL  Glucose, capillary     Status: None   Collection Time: 06/20/15 11:41 PM  Result Value Ref Range   Glucose-Capillary 76 65 - 99 mg/dL   Comment 1 Document in Chart   Renal function panel     Status: Abnormal   Collection Time: 06/21/15  2:17 AM  Result Value Ref Range   Sodium 133 (L) 135 - 145 mmol/L   Potassium 4.9 3.5 - 5.1 mmol/L   Chloride 93 (L) 101 - 111 mmol/L   CO2 26 22 - 32 mmol/L   Glucose, Bld 99 65 - 99 mg/dL   BUN 31 (H) 6 - 20 mg/dL   Creatinine, Ser 8.08 (H) 0.61 - 1.24 mg/dL   Calcium 8.8 (L) 8.9 - 10.3 mg/dL   Phosphorus 6.8 (H) 2.5 - 4.6 mg/dL   Albumin 3.0 (L) 3.5 - 5.0 g/dL   GFR calc non Af Amer 6 (L) >60 mL/min   GFR calc Af Amer 7 (L) >60 mL/min    Comment: (NOTE) The eGFR has been calculated using the CKD EPI equation. This calculation has not been validated in all clinical  situations. eGFR's persistently <60 mL/min signify possible Chronic Kidney Disease.    Anion gap 14 5 - 15  CBC     Status: Abnormal   Collection Time: 06/21/15  2:17 AM  Result Value Ref Range   WBC 10.9 (H) 4.0 - 10.5 K/uL   RBC 3.79 (L) 4.22 - 5.81 MIL/uL   Hemoglobin 11.2 (L) 13.0 - 17.0 g/dL   HCT 34.3 (L) 39.0 - 52.0 %   MCV 90.5 78.0 - 100.0 fL   MCH 29.6 26.0 - 34.0 pg   MCHC 32.7 30.0 - 36.0 g/dL   RDW 13.0 11.5 - 15.5 %   Platelets 184 150 - 400 K/uL  Glucose, capillary     Status: None   Collection Time: 06/21/15  6:09 AM  Result Value Ref Range   Glucose-Capillary 81 65 - 99 mg/dL  Glucose, capillary     Status: None   Collection Time: 06/21/15  1:08 PM  Result Value Ref Range   Glucose-Capillary 66 65 - 99 mg/dL  Glucose, capillary     Status: None   Collection Time: 06/21/15  2:14 PM  Result Value Ref Range   Glucose-Capillary 79 65 - 99 mg/dL   Comment 1 Notify RN    Comment 2 Document in Chart   Glucose, capillary     Status: None   Collection Time: 06/21/15  4:29 PM  Result Value Ref Range   Glucose-Capillary 78 65 - 99 mg/dL    Comment 1 Notify RN    Comment 2 Document in Chart   Glucose, capillary     Status: Abnormal   Collection Time: 06/21/15  8:38 PM  Result Value Ref Range   Glucose-Capillary 59 (L) 65 - 99 mg/dL  Glucose, capillary     Status: None   Collection Time: 06/21/15  9:45 PM  Result Value Ref Range   Glucose-Capillary 89 65 - 99 mg/dL  Renal function panel     Status: Abnormal   Collection Time: 06/22/15  2:27 AM  Result Value Ref Range   Sodium 135 135 - 145 mmol/L   Potassium 3.8 3.5 - 5.1 mmol/L    Comment: DELTA CHECK NOTED   Chloride 92 (L) 101 - 111 mmol/L   CO2 29 22 - 32 mmol/L   Glucose, Bld 69 65 - 99 mg/dL   BUN 17 6 - 20 mg/dL   Creatinine, Ser 5.66 (H) 0.61 - 1.24 mg/dL   Calcium 8.6 (L) 8.9 - 10.3 mg/dL   Phosphorus 5.1 (H) 2.5 - 4.6 mg/dL   Albumin 3.1 (L) 3.5 - 5.0 g/dL   GFR calc non Af Amer 10 (L) >60 mL/min   GFR calc Af Amer 11 (L) >60 mL/min    Comment: (NOTE) The eGFR has been calculated using the CKD EPI equation. This calculation has not been validated in all clinical situations. eGFR's persistently <60 mL/min signify possible Chronic Kidney Disease.    Anion gap 14 5 - 15  CBC     Status: Abnormal   Collection Time: 06/22/15  2:27 AM  Result Value Ref Range   WBC 9.5 4.0 - 10.5 K/uL   RBC 3.79 (L) 4.22 - 5.81 MIL/uL   Hemoglobin 11.1 (L) 13.0 - 17.0 g/dL   HCT 34.3 (L) 39.0 - 52.0 %   MCV 90.5 78.0 - 100.0 fL   MCH 29.3 26.0 - 34.0 pg   MCHC 32.4 30.0 - 36.0 g/dL   RDW 13.1 11.5 - 15.5 %   Platelets  205 150 - 400 K/uL  Glucose, capillary     Status: None   Collection Time: 06/22/15  3:33 AM  Result Value Ref Range   Glucose-Capillary 75 65 - 99 mg/dL  Glucose, capillary     Status: None   Collection Time: 06/22/15  6:40 AM  Result Value Ref Range   Glucose-Capillary 72 65 - 99 mg/dL    Imaging / Studies: Dg Abd 2 Views  06/21/2015  CLINICAL DATA:  60 year old male with abdominal distention. No bowel movement in the past 5 days.  Cholecystectomy performed 5 days ago. EXAM: ABDOMEN - 2 VIEW COMPARISON:  Abdominal radiograph 06/18/2015. FINDINGS: Multiple dilated loops of gas-filled small bowel noted throughout the upper abdomen measuring up to 4.7 cm in diameter with multiple air-fluid levels. There is some gas and stool scattered throughout the colon. No colonic distention is identified. No definite pneumoperitoneum. Surgical clips project over the right upper quadrant of the abdomen, compatible with the reported cholecystectomy. Postoperative changes of median sternotomy are also noted in the lower thorax where there is an abandoned epicardial pacing lead, and a pacemaker which is incompletely visualized. IMPRESSION: 1. Nonspecific bowel gas pattern which could indicate either a partial small bowel obstruction or may simply reflect a small bowel postoperative ileus. 2. No pneumoperitoneum. Electronically Signed   By: Vinnie Langton M.D.   On: 06/21/2015 15:49   Dg Abd Portable 1v  06/22/2015  CLINICAL DATA:  NG tube placement. EXAM: PORTABLE ABDOMEN - 1 VIEW COMPARISON:  Most recent radiographs yesterday at 1705 hour FINDINGS: Tip and side port of the enteric tube below the diaphragm in the stomach. Air-filled dilated small bowel again seen, overall not significantly changed. No evidence of free air. IMPRESSION: Tip and side port of the enteric tube below the diaphragm in the stomach. Electronically Signed   By: Jeb Levering M.D.   On: 06/22/2015 00:37   Dg Abd Portable 1v  06/21/2015  CLINICAL DATA:  60 year old male with nasogastric tube placement. Subsequent encounter. EXAM: PORTABLE ABDOMEN - 1 VIEW COMPARISON:  06/21/2015. FINDINGS: Multiple gas distended small bowel loops may indicate small bowel obstruction. Paucity of distal bowel gas. The possibility of free intraperitoneal air cannot be assessed on a supine view. Nasogastric tube place with side hole and tip at the gastric fundus level. IMPRESSION: Multiple gas  distended small bowel loops may indicate small bowel obstruction. Paucity of distal bowel gas. Distension has progressed with small bowel loops measuring up to 5.3 cm versus 4.7 cm earlier today. Nasogastric tube place with side hole and tip at the gastric fundus level. Electronically Signed   By: Genia Del M.D.   On: 06/21/2015 17:38    Medications / Allergies:  Scheduled Meds: . amLODipine  10 mg Oral Daily  . [START ON 06/23/2015] calcitRIOL  1 mcg Per Tube Q M,W,F-HD  . calcium acetate  1,334 mg Oral TID WC  . carvedilol  6.25 mg Oral BID WC  . cinacalcet  30 mg Oral Q supper  . [START ON 06/25/2015] darbepoetin (ARANESP) injection - DIALYSIS  100 mcg Intravenous Q Fri-HD  . gi cocktail  30 mL Oral Once  . heparin  5,000 Units Subcutaneous Q8H  . insulin aspart  0-9 Units Subcutaneous TID WC  . isosorbide mononitrate  60 mg Oral Daily  . LORazepam  0.5 mg Oral Once  . multivitamin  1 tablet Oral QHS  . polyethylene glycol  17 g Oral Daily   Continuous Infusions:  PRN Meds:.acetaminophen,  cloNIDine, gi cocktail, ketorolac, ondansetron (ZOFRAN) IV  Antibiotics: Anti-infectives    Start     Dose/Rate Route Frequency Ordered Stop   06/17/15 1500  ceFAZolin (ANCEF) IVPB 2g/100 mL premix  Status:  Discontinued    Comments:  Pharmacy may adjust dosing strength, interval, or rate of medication as needed for optimal therapy for the patient  Send with patient on call to the OR.  Anesthesia to complete antibiotic administration <81mn [facility]   2 g 200 mL/hr over 30 Minutes Intravenous On call to O.R. 06/17/15 1019 06/18/15 0837        Assessment/Plan POD#5 laparoscopic cholecystectomy---Dr. BNinfa LindenPost operative ileus -continue NGT to LIWS, clamp and walk 6x/day -give dulcolax suppository  ESRD-HD MWF Cirrhosis/hep C   EErby Pian ANP-BC [facility] Pager [phone](7A-4:30P) For consults and floor pages call  [phone](7A-4:30P)   06/22/15 8:17 AM

## 2015-06-22 NOTE — Progress Notes (Signed)
Called CT for contrast that has not come up, they said no orders are there and that they do not do 15ml.

## 2015-06-22 NOTE — Progress Notes (Signed)
Family Medicine Teaching Service Daily Progress Note Intern Pager: [phone]  Patient name: Eric [name] Medical record number: [id number] Date of birth: 05/24/1955 Age: 60 y.o. Gender: male  Primary Care Provider: [name], NP Consultants: None Code Status: Full  Pt Overview and Major Events to Date:   Assessment and Plan: Eric [name] is a 60 y.o. male presenting with chest pain/HTN . PMH is significant for HTN, HLD, Cardiac arrest, HFrEF, AICD, MI (2015), ESRD, Type 2 DM, CABG (1999), Hepatitis C (cirrhosis)  S/p cholecystectomy, POD4, now with post-opt ileus. Has been refusing all meds for the past few days due to ileus.  5/29  ABX Multiple gas distended small bowel loops may indicate small bowel obstruction. Paucity of distal bowel gas. However likely ileus given recent surgery and opioid use and recent bowel movements.  - NG tube overnight  (950 ml x 2) - Denies having any flatus  - Ice chips, NPO  - D/C'd Fentanyl for pain.   - Zofran for nausea.  - Toradol 15- 30 mg every 6 hours prn  - Ambulate as possible  - Could consider a small bowel obstruction protocol, if no improvement   - repeat gylcerin suppository   HFrEF: EF 45-50%, moderate concentric hypertrophy, Systolic pressure was mildly to moderately increased. PA peak pressure: 47 mm Hg (S). Volume well controlled with dialysis - MWF Dialysis for volume control - Continue Coreg, IMDUR -  - Holding hydralazine   HTN BP 130/72 - Norvasc 10 mg, Coreg, and IMDUR - has been refusing medication  - holding hydralazine   ESRD: Nephrology following. M/W/F dialysis - Dialysis if able today - AV fistula analyzed by vascular, good flow. - Renal panel daily  T2DM. Holding Glipizide, CBGs 69-89 - CBGS ACQHS  - Sensitive sliding scale   FEN/GI:Carb modified diet  Prophylaxis: heparin SBQ  Disposition: pending improvement of ileus.   Subjective:  Patient did not sleep at all last night.Would like NG tube  out as soon as possible. Abdominal pain has improved. Has been ambulating to help with ileus   Objective: Temp:  [98.1 F (36.7 C)-98.7 F (37.1 C)] 98.7 F (37.1 C) (05/30 0448) Pulse Rate:  [82-89] 87 (05/30 0448) Resp:  [14-24] 20 (05/30 0448) BP: (112-172)/(61-103) 130/72 mmHg (05/30 0448) SpO2:  [94 %-98 %] 97 % (05/29 2038) Weight:  [178 lb 9.6 oz (81.012 kg)-185 lb 3 oz (84 kg)] 178 lb 9.6 oz (81.012 kg) (05/30 0448) Physical Exam: General: Patient sitting in chair, NAD Cardiovascular: RRR, no murmurs  Respiratory: CTAB, no wheezing,  Abdomen: BS+, improving distention, no ttp  Extremities: moving all extremities   Laboratory:  Recent Labs Lab 06/20/15 0217 06/21/15 0217 06/22/15 0227  WBC 8.6 10.9* 9.5  HGB 11.3* 11.2* 11.1*  HCT 33.2* 34.3* 34.3*  PLT 145* 184 205    Recent Labs Lab 06/20/15 0217 06/21/15 0217 06/22/15 0227  NA 131* 133* 135  K 4.7 4.9 3.8  CL 93* 93* 92*  CO2 23 26 29   BUN 21* 31* 17  CREATININE 6.03* 8.08* 5.66*  CALCIUM 8.8* 8.8* 8.6*  PROT 9.3*  --   --   BILITOT 1.1  --   --   ALKPHOS 56  --   --   ALT 11*  --   --   AST 20  --   --   GLUCOSE 73 99 69      No results for input(s): TROPONINI in the last 168 hours.   Imaging/Diagnostic Tests: Dg  Abd 2 Views  06/21/2015  CLINICAL DATA:  60 year old male with abdominal distention. No bowel movement in the past 5 days. Cholecystectomy performed 5 days ago. EXAM: ABDOMEN - 2 VIEW COMPARISON:  Abdominal radiograph 06/18/2015. FINDINGS: Multiple dilated loops of gas-filled small bowel noted throughout the upper abdomen measuring up to 4.7 cm in diameter with multiple air-fluid levels. There is some gas and stool scattered throughout the colon. No colonic distention is identified. No definite pneumoperitoneum. Surgical clips project over the right upper quadrant of the abdomen, compatible with the reported cholecystectomy. Postoperative changes of median sternotomy are also noted in the  lower thorax where there is an abandoned epicardial pacing lead, and a pacemaker which is incompletely visualized. IMPRESSION: 1. Nonspecific bowel gas pattern which could indicate either a partial small bowel obstruction or may simply reflect a small bowel postoperative ileus. 2. No pneumoperitoneum. Electronically Signed   By: Vinnie Langton M.D.   On: 06/21/2015 15:49   Dg Abd Portable 1v  06/22/2015  CLINICAL DATA:  NG tube placement. EXAM: PORTABLE ABDOMEN - 1 VIEW COMPARISON:  Most recent radiographs yesterday at 1705 hour FINDINGS: Tip and side port of the enteric tube below the diaphragm in the stomach. Air-filled dilated small bowel again seen, overall not significantly changed. No evidence of free air. IMPRESSION: Tip and side port of the enteric tube below the diaphragm in the stomach. Electronically Signed   By: Jeb Levering M.D.   On: 06/22/2015 00:37   Dg Abd Portable 1v  06/21/2015  CLINICAL DATA:  60 year old male with nasogastric tube placement. Subsequent encounter. EXAM: PORTABLE ABDOMEN - 1 VIEW COMPARISON:  06/21/2015. FINDINGS: Multiple gas distended small bowel loops may indicate small bowel obstruction. Paucity of distal bowel gas. The possibility of free intraperitoneal air cannot be assessed on a supine view. Nasogastric tube place with side hole and tip at the gastric fundus level. IMPRESSION: Multiple gas distended small bowel loops may indicate small bowel obstruction. Paucity of distal bowel gas. Distension has progressed with small bowel loops measuring up to 5.3 cm versus 4.7 cm earlier today. Nasogastric tube place with side hole and tip at the gastric fundus level. Electronically Signed   By: Genia Del M.D.   On: 06/21/2015 17:38    Jania Steinke Cletis Media, MD 06/22/2015, 7:08 AM PGY-1, [place] Intern pager: [phone], text pages welcome

## 2015-06-22 NOTE — Progress Notes (Addendum)
Physical Therapy Treatment Patient Details Name: Eric [name] MRN: [id number] DOB: 08-06-1955 Today's Date: 07-16-15    History of Present Illness Pt admitted with CP and symptomatic cholelithiasis. s/p lap chole 5/25. Pt now with post op ileus and has NG tube. PMH: ESRD, metabolic bone disease,  CABG, ICD, DM, Hep C, liver cirrhosis, seizure.    PT Comments    Pt mobilizing well with rolling walker.   Follow Up Recommendations  No PT follow up;Supervision for mobility/OOB     Equipment Recommendations  None recommended by PT (Pt has rolling walker)    Recommendations for Other Services       Precautions / Restrictions Precautions Precautions: Fall Restrictions Weight Bearing Restrictions: No    Mobility  Bed Mobility               General bed mobility comments: sitting EOB  Transfers Overall transfer level: Modified independent Equipment used: Rolling walker (2 wheeled)                Ambulation/Gait Ambulation/Gait assistance: Supervision Ambulation Distance (Feet): 400 Feet Assistive device: Rolling walker (2 wheeled) Gait Pattern/deviations: Step-through pattern;Decreased stride length Gait velocity: decr Gait velocity interpretation: Below normal speed for age/gender General Gait Details: No loss of balance with walker.   Stairs            Wheelchair Mobility    Modified Rankin (Stroke Patients Only)       Balance Overall balance assessment: Needs assistance Sitting-balance support: No upper extremity supported;Feet supported Sitting balance-Leahy Scale: Good     Standing balance support: Bilateral upper extremity supported Standing balance-Leahy Scale: Poor Standing balance comment: walker support                    Cognition Arousal/Alertness: Lethargic;Suspect due to medications Behavior During Therapy: Flat affect Overall Cognitive Status: Within Functional Limits for tasks assessed                       Exercises      General Comments        Pertinent Vitals/Pain      Home Living                      Prior Function            PT Goals (current goals can now be found in the care plan section) Acute Rehab PT Goals Patient Stated Goal: go home Progress towards PT goals: Progressing toward goals    Frequency  Min 3X/week    PT Plan Current plan remains appropriate    Co-evaluation             End of Session Equipment Utilized During Treatment: Gait belt Activity Tolerance: Patient tolerated treatment well Patient left: in chair;with call bell/phone within reach;with family/visitor present     Time: [id number] PT Time Calculation (min) (ACUTE ONLY): 12 min  Charges:  $Gait Training: 8-22 mins                    G Codes:      Eric [name] 07/16/15, 1:28 PM [employer] PT [phone]

## 2015-06-22 NOTE — Progress Notes (Signed)
  [facility] Progress Note   Subjective: no c/o  Filed Vitals:   06/21/15 1413 06/21/15 2038 06/22/15 0448 06/22/15 1459  BP: 143/79 112/61 130/72 140/79  Pulse: 89 85 87 93  Temp: 98.2 F (36.8 C) 98.1 F (36.7 C) 98.7 F (37.1 C) 98.3 F (36.8 C)  TempSrc: Oral Oral Oral Oral  Resp:  16 20 18   Weight:   81.012 kg (178 lb 9.6 oz)   SpO2: 98% 97%  94%    Inpatient medications: . amLODipine  10 mg Oral Daily  . bisacodyl  10 mg Rectal Once  . [START ON 06/23/2015] calcitRIOL  1 mcg Per Tube Q M,W,F-HD  . calcium acetate  1,334 mg Oral TID WC  . carvedilol  6.25 mg Oral BID WC  . cinacalcet  30 mg Oral Q supper  . [START ON 06/25/2015] darbepoetin (ARANESP) injection - DIALYSIS  100 mcg Intravenous Q Fri-HD  . diatrizoate meglumine-sodium  90 mL Oral Once  . gi cocktail  30 mL Oral Once  . Glycerin (Adult)  1 suppository Rectal Once  . heparin  5,000 Units Subcutaneous Q8H  . insulin aspart  0-9 Units Subcutaneous TID WC  . isosorbide mononitrate  60 mg Oral Daily  . LORazepam  0.5 mg Oral Once  . multivitamin  1 tablet Oral QHS  . polyethylene glycol  17 g Oral Daily   . dextrose 5 % and 0.45% NaCl 100 mL/hr at 06/22/15 1608   acetaminophen, cloNIDine, gi cocktail, ketorolac, ondansetron (ZOFRAN) IV  Exam: Alert, no distress No jvd Chest clear bilat RRR no mrg Abd distended, firm, nontender, dec'd BS No LE edema R IJ cath, RUA AVF +bruit Alert nf ox 3  Dialysis: MWF [facility]  4h 79min   81.5kg  2/2.25 bath  RUA AVF (maturing) / R IJ cath Hep 7000 Calc 1ug Last pth 802  Hb 12  44%sat      Assessment: 1 Cholelithiasis, s/p lap chole 5/25, w post-op ileus 2 ESRD HD mwf, ok to use AVF per VVS, pt has been refusing 3 HTN bp's up on amlod/ coreg 4 Hx CABG/ ICD/ CM 5 HypoNa+ improving w HD 6 ^PTH cont med rx 7 Vol at dry wt   Plan - HD tomorrow   Kelly Splinter MD [facility] pager [phone]    cell [phone] 06/22/2015, 6:06 PM     Recent Labs Lab 06/20/15 0217 06/21/15 0217 06/22/15 0227  NA 131* 133* 135  K 4.7 4.9 3.8  CL 93* 93* 92*  CO2 23 26 29   GLUCOSE 73 99 69  BUN 21* 31* 17  CREATININE 6.03* 8.08* 5.66*  CALCIUM 8.8* 8.8* 8.6*  PHOS 5.8* 6.8* 5.1*    Recent Labs Lab 06/20/15 0217 06/21/15 0217 06/22/15 0227  AST 20  --   --   ALT 11*  --   --   ALKPHOS 56  --   --   BILITOT 1.1  --   --   PROT 9.3*  --   --   ALBUMIN 3.2*  3.2* 3.0* 3.1*    Recent Labs Lab 06/20/15 0217 06/21/15 0217 06/22/15 0227  WBC 8.6 10.9* 9.5  HGB 11.3* 11.2* 11.1*  HCT 33.2* 34.3* 34.3*  MCV 89.5 90.5 90.5  PLT 145* 184 205

## 2015-06-22 NOTE — Progress Notes (Signed)
Paged teaching services have multiple questions about CT and clamping for meds.

## 2015-06-22 NOTE — Progress Notes (Addendum)
Pt. Refused meds this morning @0730 , I have been trying to educate on the importance of meds. Also around 0900 pt. NG tube was pulled about 4 inches. I pushed back, ausculted, and placed order for chest xray to confirm placement. Pt. Was irritable wanting the removal of NG tube, I educated on the importance of tube. This is the second event in which the tube has been ejected and tape across nose removed. Page Estill Bakes PA to let her know. I explained that until the chest xray has been done I can not let him have ice chips and was sure of placement mad CH-Kristen aware. Paged MDx3 to get order to clamp to give meds.

## 2015-06-23 ENCOUNTER — Inpatient Hospital Stay (HOSPITAL_COMMUNITY): Payer: Medicaid Other

## 2015-06-23 DIAGNOSIS — Z4502 Encounter for adjustment and management of automatic implantable cardiac defibrillator: Secondary | ICD-10-CM | POA: Insufficient documentation

## 2015-06-23 DIAGNOSIS — Z0189 Encounter for other specified special examinations: Secondary | ICD-10-CM | POA: Insufficient documentation

## 2015-06-23 DIAGNOSIS — K9189 Other postprocedural complications and disorders of digestive system: Secondary | ICD-10-CM

## 2015-06-23 DIAGNOSIS — K567 Ileus, unspecified: Secondary | ICD-10-CM | POA: Insufficient documentation

## 2015-06-23 DIAGNOSIS — T85598A Other mechanical complication of other gastrointestinal prosthetic devices, implants and grafts, initial encounter: Secondary | ICD-10-CM | POA: Insufficient documentation

## 2015-06-23 DIAGNOSIS — Z4659 Encounter for fitting and adjustment of other gastrointestinal appliance and device: Secondary | ICD-10-CM

## 2015-06-23 LAB — RENAL FUNCTION PANEL
ALBUMIN: 3 g/dL — AB (ref 3.5–5.0)
ANION GAP: 14 (ref 5–15)
BUN: 32 mg/dL — ABNORMAL HIGH (ref 6–20)
CALCIUM: 8.6 mg/dL — AB (ref 8.9–10.3)
CO2: 38 mmol/L — AB (ref 22–32)
CREATININE: 8.86 mg/dL — AB (ref 0.61–1.24)
Chloride: 88 mmol/L — ABNORMAL LOW (ref 101–111)
GFR calc non Af Amer: 6 mL/min — ABNORMAL LOW (ref >60)
GFR, EST AFRICAN AMERICAN: 7 mL/min — AB (ref >60)
GLUCOSE: 146 mg/dL — AB (ref 65–99)
PHOSPHORUS: 6.1 mg/dL — AB (ref 2.5–4.6)
Potassium: 3.6 mmol/L (ref 3.5–5.1)
SODIUM: 140 mmol/L (ref 135–145)

## 2015-06-23 LAB — CBC
HCT: 33.5 % — ABNORMAL LOW (ref 39.0–52.0)
HEMOGLOBIN: 10.8 g/dL — AB (ref 13.0–17.0)
MCH: 29.3 pg (ref 26.0–34.0)
MCHC: 32.2 g/dL (ref 30.0–36.0)
MCV: 90.8 fL (ref 78.0–100.0)
PLATELETS: 182 10*3/uL (ref 150–400)
RBC: 3.69 MIL/uL — AB (ref 4.22–5.81)
RDW: 13 % (ref 11.5–15.5)
WBC: 10.4 10*3/uL (ref 4.0–10.5)

## 2015-06-23 LAB — GLUCOSE, CAPILLARY
GLUCOSE-CAPILLARY: 135 mg/dL — AB (ref 65–99)
Glucose-Capillary: 201 mg/dL — ABNORMAL HIGH (ref 65–99)
Glucose-Capillary: 117 mg/dL — ABNORMAL HIGH (ref 65–99)
Glucose-Capillary: 152 mg/dL — ABNORMAL HIGH (ref 65–99)

## 2015-06-23 MED ORDER — PENTAFLUOROPROP-TETRAFLUOROETH EX AERO: 1.0000 "application " | INHALATION_SPRAY | CUTANEOUS | Status: DC | PRN

## 2015-06-23 MED ORDER — SODIUM CHLORIDE 0.9 % IV SOLN: 100.0000 mL | INTRAVENOUS | Status: DC | PRN

## 2015-06-23 MED ORDER — LIDOCAINE-PRILOCAINE 2.5-2.5 % EX CREA
1.0000 "application " | TOPICAL_CREAM | CUTANEOUS | Status: DC | PRN
  Filled 2015-06-23: qty 5

## 2015-06-23 MED ORDER — HEPARIN SODIUM (PORCINE) 1000 UNIT/ML DIALYSIS: 1000.0000 [IU] | INTRAMUSCULAR | Status: DC | PRN

## 2015-06-23 MED ORDER — ALTEPLASE 2 MG IJ SOLR: 2.0000 mg | Freq: Once | INTRAMUSCULAR | Status: DC | PRN

## 2015-06-23 MED ORDER — LIDOCAINE HCL (PF) 1 % IJ SOLN: 5.0000 mL | INTRAMUSCULAR | Status: DC | PRN

## 2015-06-23 MED ORDER — BISACODYL 10 MG RE SUPP
10.0000 mg | Freq: Once | RECTAL | Status: AC
  Administered 2015-06-23: 10 mg via RECTAL
  Filled 2015-06-23: qty 1

## 2015-06-23 NOTE — Progress Notes (Signed)
Family Medicine Teaching Service Daily Progress Note Intern Pager: [phone]  Patient name: Eric [name] Medical record number: [id number] Date of birth: [birth year] Age: 60 y.o. Gender: male  Primary Care Provider: [name], NP Consultants: None Code Status: Full  Pt Overview and Major Events to Date:   Assessment and Plan: Raynold Mangas is a 60 y.o. male presenting with chest pain/HTN . PMH is significant for HTN, HLD, Cardiac arrest, HFrEF, AICD, MI (2015), ESRD, Type 2 DM, CABG (1999), Hepatitis C (cirrhosis)  S/p cholecystectomy, POD6, now with post-opt ileus. Had flatus yesterday, No bowel movements, refused suppository yesterday.  5/29  ABX Multiple gas distended small bowel loops may indicate small bowel obstruction.  - NG tube 2750 ml ouptput from NG tube - Discussed with surgery, no TPN for now, awaiting recs.  - Refused glycerin suppository yesterday  - Ice chips - only about 1 cup of ice chips over the last  24 hour per patient, NPO ( no meds) - Zofran for nausea.  - Toradol 15- 30 mg every 6 hours prn for 5 days, only used on 5/29  - Ambulate 6 times daily per instructions  - Small obstruction protocol - repeat xray after gastrograffin -showing decreased gas in the small bowel   Urinary Retention: Urinary retention 600 ml overnight. In/Out Cath  - Bladder scan q4hrs, if greater than 200 ml, then in/out cath.   HFrEF: EF 45-50%, moderate concentric hypertrophy, Systolic pressure was mildly to moderately increased. PA peak pressure: 47 mm Hg (S). Volume well controlled with dialysis - MWF Dialysis for volume control - Continue Coreg, IMDUR  - Holding hydralazine   HTN, Elevated blood pressure overnight, now normotensive, 117/77  - Norvasc 10 mg, Coreg, and IMDUR, received Coreg today  - holding hydralazine   ESRD: Nephrology following. M/W/F dialysis - Dialysis if able today - AV fistula analyzed by vascular, good flow. - Renal panel daily  T2DM.  Holding Glipizide, CBGs 201, 166, 125  - CBGS ACQHS  - Sensitive sliding scale   FEN/GI: NPO with ice chips, D5-NS @ 50 ml  Prophylaxis: heparin SBQ  Disposition: pending improvement of ileus.    Subjective:  Patient in poor spirits today. Patient started crying, states he feels frustrated with current circumstances. Patient was not able to walk yesterday, states pain is better when he is allowed to walk.  Objective: Temp:  [98.3 F (36.8 C)-98.8 F (37.1 C)] 98.8 F (37.1 C) (05/31 0445) Pulse Rate:  [93-97] 97 (05/31 0445) Resp:  [18-20] 20 (05/31 0445) BP: (140-175)/(79-85) 175/81 mmHg (05/31 0445) SpO2:  [92 %-94 %] 94 % (05/31 0445) Weight:  [176 lb 2.4 oz (79.9 kg)] 176 lb 2.4 oz (79.9 kg) (05/31 0445) Physical Exam: General: Patient sitting in chair, NAD Cardiovascular: RRR, no murmurs  Respiratory: CTAB, no wheezing or rhnochi Abdomen: BS+, improving distention, no ttp, abdomen soft  Extremities: moving all extremities   Laboratory:  Recent Labs Lab 06/20/15 0217 06/21/15 0217 06/22/15 0227  WBC 8.6 10.9* 9.5  HGB 11.3* 11.2* 11.1*  HCT 33.2* 34.3* 34.3*  PLT 145* 184 205    Recent Labs Lab 06/20/15 0217 06/21/15 0217 06/22/15 0227  NA 131* 133* 135  K 4.7 4.9 3.8  CL 93* 93* 92*  CO2 23 26 29   BUN 21* 31* 17  CREATININE 6.03* 8.08* 5.66*  CALCIUM 8.8* 8.8* 8.6*  PROT 9.3*  --   --   BILITOT 1.1  --   --   ALKPHOS 56  --   --  ALT 11*  --   --   AST 20  --   --   GLUCOSE 73 99 69      No results for input(s): TROPONINI in the last 168 hours.   Imaging/Diagnostic Tests: Dg Abd 1 View  06/22/2015  CLINICAL DATA:  Impaired nasogastric feeding tube, subsequent encounter EXAM: ABDOMEN - 1 VIEW COMPARISON:  06/22/2015 FINDINGS: An enteric tube crosses the gastroesophageal junction with tip in the left upper quadrant most consistent with gastric location. Numerous dilated loops of small bowel are again noted consistent with small-bowel  obstruction. Previous maximal diameter was 47 mm. Current maximal diameter is 51 mm. IMPRESSION: Enteric tube as described.  Persistent small-bowel obstruction. Electronically Signed   By: Skipper Cliche M.D.   On: 06/22/2015 13:03   Dg Abd 2 Views  06/21/2015  CLINICAL DATA:  60 year old male with abdominal distention. No bowel movement in the past 5 days. Cholecystectomy performed 5 days ago. EXAM: ABDOMEN - 2 VIEW COMPARISON:  Abdominal radiograph 06/18/2015. FINDINGS: Multiple dilated loops of gas-filled small bowel noted throughout the upper abdomen measuring up to 4.7 cm in diameter with multiple air-fluid levels. There is some gas and stool scattered throughout the colon. No colonic distention is identified. No definite pneumoperitoneum. Surgical clips project over the right upper quadrant of the abdomen, compatible with the reported cholecystectomy. Postoperative changes of median sternotomy are also noted in the lower thorax where there is an abandoned epicardial pacing lead, and a pacemaker which is incompletely visualized. IMPRESSION: 1. Nonspecific bowel gas pattern which could indicate either a partial small bowel obstruction or may simply reflect a small bowel postoperative ileus. 2. No pneumoperitoneum. Electronically Signed   By: Vinnie Langton M.D.   On: 06/21/2015 15:49   Dg Abd Portable 1v-small Bowel Obstruction Protocol-initial, 8 Hr Delay  06/23/2015  CLINICAL DATA:  Follow-up small bowel obstruction, 8 hour delayed image EXAM: PORTABLE ABDOMEN - 1 VIEW COMPARISON:  Two supine abdominal images dated Jun 22, 2015 FINDINGS: There remain loops of mildly distended gas-filled small bowel with maximal diameter of approximately 44 mm. Some of the orally administered contrast likely lies in small bowel in the right mid abdomen. No significant colonic gas is observed in the ascending and transverse colons. There is a small amount of gas and stool in the descending colon which is stable. No free  extraluminal gas collections are observed. There is an esophagogastric tube present whose tip lies in the mid body of the stomach. There are radiodensities that project over the left mid abdomen. There are surgical clips in the gallbladder fossa. IMPRESSION: The degree of gaseous distention of small bowel loops has decreased slightly. No free extraluminal gas collections are observed. Electronically Signed   By: David  Martinique M.D.   On: 06/23/2015 07:49   Dg Abd Portable 1v  06/22/2015  CLINICAL DATA:  NG tube placement. EXAM: PORTABLE ABDOMEN - 1 VIEW COMPARISON:  Most recent radiographs yesterday at 1705 hour FINDINGS: Tip and side port of the enteric tube below the diaphragm in the stomach. Air-filled dilated small bowel again seen, overall not significantly changed. No evidence of free air. IMPRESSION: Tip and side port of the enteric tube below the diaphragm in the stomach. Electronically Signed   By: Jeb Levering M.D.   On: 06/22/2015 00:37   Dg Abd Portable 1v  06/21/2015  CLINICAL DATA:  60 year old male with nasogastric tube placement. Subsequent encounter. EXAM: PORTABLE ABDOMEN - 1 VIEW COMPARISON:  06/21/2015. FINDINGS: Multiple gas  distended small bowel loops may indicate small bowel obstruction. Paucity of distal bowel gas. The possibility of free intraperitoneal air cannot be assessed on a supine view. Nasogastric tube place with side hole and tip at the gastric fundus level. IMPRESSION: Multiple gas distended small bowel loops may indicate small bowel obstruction. Paucity of distal bowel gas. Distension has progressed with small bowel loops measuring up to 5.3 cm versus 4.7 cm earlier today. Nasogastric tube place with side hole and tip at the gastric fundus level. Electronically Signed   By: Genia Del M.D.   On: 06/21/2015 17:38    Jesscia Imm Cletis Media, MD 06/23/2015, 8:18 AM PGY-1, [place] Intern pager: [phone], text pages welcome

## 2015-06-23 NOTE — Progress Notes (Signed)
[facility] Progress Note   Subjective: no c/o, passing lots of gas last night  Filed Vitals:   06/22/15 2024 06/23/15 0445 06/23/15 0824 06/23/15 0830  BP: 160/85 175/81 155/91 145/89  Pulse: 95 97 91 90  Temp: 98.8 F (37.1 C) 98.8 F (37.1 C) 98.9 F (37.2 C)   TempSrc: Oral Oral Oral   Resp: 20 20 19 21   Weight:  79.9 kg (176 lb 2.4 oz) 79.3 kg (174 lb 13.2 oz)   SpO2: 92% 94% 96%     Inpatient medications: . amLODipine  10 mg Oral Daily  . bisacodyl  10 mg Rectal Once  . calcitRIOL  1 mcg Per Tube Q M,W,F-HD  . calcium acetate  1,334 mg Oral TID WC  . carvedilol  6.25 mg Oral BID WC  . cinacalcet  30 mg Oral Q supper  . [START ON 06/25/2015] darbepoetin (ARANESP) injection - DIALYSIS  100 mcg Intravenous Q Fri-HD  . diatrizoate meglumine-sodium  90 mL Oral Once  . gi cocktail  30 mL Oral Once  . Glycerin (Adult)  1 suppository Rectal Once  . heparin  5,000 Units Subcutaneous Q8H  . insulin aspart  0-9 Units Subcutaneous TID WC  . isosorbide mononitrate  60 mg Oral Daily  . LORazepam  0.5 mg Oral Once  . multivitamin  1 tablet Oral QHS  . polyethylene glycol  17 g Oral Daily   . dextrose 5 % and 0.45% NaCl 100 mL/hr at 06/22/15 1609   sodium chloride, sodium chloride, acetaminophen, alteplase, cloNIDine, gi cocktail, heparin, ketorolac, lidocaine (PF), lidocaine-prilocaine, ondansetron (ZOFRAN) IV, pentafluoroprop-tetrafluoroeth  Exam: Alert, no distress No jvd Chest clear bilat RRR no mrg Abd distended, firm, nontender, dec'd BS No LE edema R IJ cath, RUA AVF +bruit Alert nf ox 3  Dialysis: MWF [facility]  4h 86min   81.5kg  2/2.25 bath  RUA AVF (maturing) / R IJ cath Hep 7000 Calc 1ug Last pth 802  Hb 12  44%sat      Assessment: 1 Cholelithiasis / Lap chole 5/25 - postop ileus, passing flatus last night  2 ESRD HD mwf, ok to use AVF per VVS, pt has been refusing tho 3 HTN bp's up on amlod/ coreg, doesn't appear vol depleted 4 Hx CABG/ ICD/  CM 5 HypoNa+ improving w HD 6 ^PTH cont med rx 7 Vol under dry wt slightly   Plan - HD today, min UF, decreased IVF 30 cc/hr   Kelly Splinter MD [facility] pager [phone]    cell [phone] 06/23/2015, 8:49 AM    Recent Labs Lab 06/20/15 0217 06/21/15 0217 06/22/15 0227  NA 131* 133* 135  K 4.7 4.9 3.8  CL 93* 93* 92*  CO2 23 26 29   GLUCOSE 73 99 69  BUN 21* 31* 17  CREATININE 6.03* 8.08* 5.66*  CALCIUM 8.8* 8.8* 8.6*  PHOS 5.8* 6.8* 5.1*    Recent Labs Lab 06/20/15 0217 06/21/15 0217 06/22/15 0227  AST 20  --   --   ALT 11*  --   --   ALKPHOS 56  --   --   BILITOT 1.1  --   --   PROT 9.3*  --   --   ALBUMIN 3.2*  3.2* 3.0* 3.1*    Recent Labs Lab 06/20/15 0217 06/21/15 0217 06/22/15 0227  WBC 8.6 10.9* 9.5  HGB 11.3* 11.2* 11.1*  HCT 33.2* 34.3* 34.3*  MCV 89.5 90.5 90.5  PLT 145* 184 205

## 2015-06-23 NOTE — Progress Notes (Signed)
In and out cath done pt. Tol. Well got 700 ml of amber urine.

## 2015-06-23 NOTE — Progress Notes (Signed)
Patient is passing gas

## 2015-06-23 NOTE — Progress Notes (Signed)
Patient ID: Eric [name], male   DOB: 03-12-1955, 60 y.o.   MRN: [id number]     [facility]      [address] [place], [place] [address]    Phone: [phone] FAX: [phone]     Subjective: Passed flatus last night.  2.7L ngt output.  Did not walk yesterday.   Objective:  Vital signs:  Filed Vitals:   06/23/15 0925 06/23/15 0956 06/23/15 1029 06/23/15 1058  BP: 97/64 100/66 135/74 138/87  Pulse: 88 84 84 96  Temp:      TempSrc:      Resp: '20 22 17 19  '$ Weight:      SpO2:        Last BM Date: 06/17/15  Intake/Output   Yesterday:  05/30 0701 - 05/31 0700 In: 1660 [I.V.:1200; NG/GT:90] Out: 3450 [Urine:700; Emesis/NG output:2750] This shift:      Physical Exam: General: Pt awake/alert/oriented x4 in no acute distress  Abdomen: Soft.  distended.  Non tender.  Incisions are c/d/i.   No evidence of peritonitis.  No incarcerated hernias.   Problem List:   Active Problems:   Chest pain   Numbness   RUQ abdominal pain   Gallbladder calculus   Pancreatitis   Gallstones   ESRD (end stage renal disease) (HCC)   Abdominal distension   Ileus (HCC)    Results:   Labs: Results for orders placed or performed during the hospital encounter of 06/14/15 (from the past 48 hour(s))  Glucose, capillary     Status: None   Collection Time: 06/21/15  1:08 PM  Result Value Ref Range   Glucose-Capillary 66 65 - 99 mg/dL  Glucose, capillary     Status: None   Collection Time: 06/21/15  2:14 PM  Result Value Ref Range   Glucose-Capillary 79 65 - 99 mg/dL   Comment 1 Notify RN    Comment 2 Document in Chart   Glucose, capillary     Status: None   Collection Time: 06/21/15  4:29 PM  Result Value Ref Range   Glucose-Capillary 78 65 - 99 mg/dL   Comment 1 Notify RN    Comment 2 Document in Chart   Glucose, capillary     Status: Abnormal   Collection Time: 06/21/15  8:38 PM  Result Value Ref Range   Glucose-Capillary 59  (L) 65 - 99 mg/dL  Glucose, capillary     Status: None   Collection Time: 06/21/15  9:45 PM  Result Value Ref Range   Glucose-Capillary 89 65 - 99 mg/dL  Renal function panel     Status: Abnormal   Collection Time: 06/22/15  2:27 AM  Result Value Ref Range   Sodium 135 135 - 145 mmol/L   Potassium 3.8 3.5 - 5.1 mmol/L    Comment: DELTA CHECK NOTED   Chloride 92 (L) 101 - 111 mmol/L   CO2 29 22 - 32 mmol/L   Glucose, Bld 69 65 - 99 mg/dL   BUN 17 6 - 20 mg/dL   Creatinine, Ser 5.66 (H) 0.61 - 1.24 mg/dL   Calcium 8.6 (L) 8.9 - 10.3 mg/dL   Phosphorus 5.1 (H) 2.5 - 4.6 mg/dL   Albumin 3.1 (L) 3.5 - 5.0 g/dL   GFR calc non Af Amer 10 (L) >60 mL/min   GFR calc Af Amer 11 (L) >60 mL/min    Comment: (NOTE) The eGFR has been calculated using the CKD EPI equation. This calculation has not  been validated in all clinical situations. eGFR's persistently <60 mL/min signify possible Chronic Kidney Disease.    Anion gap 14 5 - 15  CBC     Status: Abnormal   Collection Time: 06/22/15  2:27 AM  Result Value Ref Range   WBC 9.5 4.0 - 10.5 K/uL   RBC 3.79 (L) 4.22 - 5.81 MIL/uL   Hemoglobin 11.1 (L) 13.0 - 17.0 g/dL   HCT 34.3 (L) 39.0 - 52.0 %   MCV 90.5 78.0 - 100.0 fL   MCH 29.3 26.0 - 34.0 pg   MCHC 32.4 30.0 - 36.0 g/dL   RDW 13.1 11.5 - 15.5 %   Platelets 205 150 - 400 K/uL  Glucose, capillary     Status: None   Collection Time: 06/22/15  3:33 AM  Result Value Ref Range   Glucose-Capillary 75 65 - 99 mg/dL  Glucose, capillary     Status: None   Collection Time: 06/22/15  6:40 AM  Result Value Ref Range   Glucose-Capillary 72 65 - 99 mg/dL  Glucose, capillary     Status: None   Collection Time: 06/22/15 11:00 AM  Result Value Ref Range   Glucose-Capillary 74 65 - 99 mg/dL   Comment 1 Notify RN    Comment 2 Document in Chart   Glucose, capillary     Status: Abnormal   Collection Time: 06/22/15  4:32 PM  Result Value Ref Range   Glucose-Capillary 125 (H) 65 - 99 mg/dL    Comment 1 Notify RN    Comment 2 Document in Chart   Glucose, capillary     Status: Abnormal   Collection Time: 06/22/15  9:09 PM  Result Value Ref Range   Glucose-Capillary 166 (H) 65 - 99 mg/dL  Glucose, capillary     Status: Abnormal   Collection Time: 06/23/15  6:16 AM  Result Value Ref Range   Glucose-Capillary 201 (H) 65 - 99 mg/dL  CBC     Status: Abnormal   Collection Time: 06/23/15  8:41 AM  Result Value Ref Range   WBC 10.4 4.0 - 10.5 K/uL   RBC 3.69 (L) 4.22 - 5.81 MIL/uL   Hemoglobin 10.8 (L) 13.0 - 17.0 g/dL   HCT 33.5 (L) 39.0 - 52.0 %   MCV 90.8 78.0 - 100.0 fL   MCH 29.3 26.0 - 34.0 pg   MCHC 32.2 30.0 - 36.0 g/dL   RDW 13.0 11.5 - 15.5 %   Platelets 182 150 - 400 K/uL  Renal function panel     Status: Abnormal   Collection Time: 06/23/15  8:42 AM  Result Value Ref Range   Sodium 140 135 - 145 mmol/L   Potassium 3.6 3.5 - 5.1 mmol/L   Chloride 88 (L) 101 - 111 mmol/L   CO2 38 (H) 22 - 32 mmol/L   Glucose, Bld 146 (H) 65 - 99 mg/dL   BUN 32 (H) 6 - 20 mg/dL   Creatinine, Ser 8.86 (H) 0.61 - 1.24 mg/dL    Comment: DELTA CHECK NOTED   Calcium 8.6 (L) 8.9 - 10.3 mg/dL   Phosphorus 6.1 (H) 2.5 - 4.6 mg/dL   Albumin 3.0 (L) 3.5 - 5.0 g/dL   GFR calc non Af Amer 6 (L) >60 mL/min   GFR calc Af Amer 7 (L) >60 mL/min    Comment: (NOTE) The eGFR has been calculated using the CKD EPI equation. This calculation has not been validated in all clinical situations. eGFR's persistently <60 mL/min signify  possible Chronic Kidney Disease.    Anion gap 14 5 - 15    Imaging / Studies: Dg Abd 1 View  06/22/2015  CLINICAL DATA:  Impaired nasogastric feeding tube, subsequent encounter EXAM: ABDOMEN - 1 VIEW COMPARISON:  06/22/2015 FINDINGS: An enteric tube crosses the gastroesophageal junction with tip in the left upper quadrant most consistent with gastric location. Numerous dilated loops of small bowel are again noted consistent with small-bowel obstruction. Previous maximal  diameter was 47 mm. Current maximal diameter is 51 mm. IMPRESSION: Enteric tube as described.  Persistent small-bowel obstruction. Electronically Signed   By: Skipper Cliche M.D.   On: 06/22/2015 13:03   Dg Abd 2 Views  06/21/2015  CLINICAL DATA:  60 year old male with abdominal distention. No bowel movement in the past 5 days. Cholecystectomy performed 5 days ago. EXAM: ABDOMEN - 2 VIEW COMPARISON:  Abdominal radiograph 06/18/2015. FINDINGS: Multiple dilated loops of gas-filled small bowel noted throughout the upper abdomen measuring up to 4.7 cm in diameter with multiple air-fluid levels. There is some gas and stool scattered throughout the colon. No colonic distention is identified. No definite pneumoperitoneum. Surgical clips project over the right upper quadrant of the abdomen, compatible with the reported cholecystectomy. Postoperative changes of median sternotomy are also noted in the lower thorax where there is an abandoned epicardial pacing lead, and a pacemaker which is incompletely visualized. IMPRESSION: 1. Nonspecific bowel gas pattern which could indicate either a partial small bowel obstruction or may simply reflect a small bowel postoperative ileus. 2. No pneumoperitoneum. Electronically Signed   By: Vinnie Langton M.D.   On: 06/21/2015 15:49   Dg Abd Portable 1v-small Bowel Obstruction Protocol-initial, 8 Hr Delay  06/23/2015  CLINICAL DATA:  Follow-up small bowel obstruction, 8 hour delayed image EXAM: PORTABLE ABDOMEN - 1 VIEW COMPARISON:  Two supine abdominal images dated Jun 22, 2015 FINDINGS: There remain loops of mildly distended gas-filled small bowel with maximal diameter of approximately 44 mm. Some of the orally administered contrast likely lies in small bowel in the right mid abdomen. No significant colonic gas is observed in the ascending and transverse colons. There is a small amount of gas and stool in the descending colon which is stable. No free extraluminal gas collections  are observed. There is an esophagogastric tube present whose tip lies in the mid body of the stomach. There are radiodensities that project over the left mid abdomen. There are surgical clips in the gallbladder fossa. IMPRESSION: The degree of gaseous distention of small bowel loops has decreased slightly. No free extraluminal gas collections are observed. Electronically Signed   By: David  Martinique M.D.   On: 06/23/2015 07:49   Dg Abd Portable 1v  06/22/2015  CLINICAL DATA:  NG tube placement. EXAM: PORTABLE ABDOMEN - 1 VIEW COMPARISON:  Most recent radiographs yesterday at 1705 hour FINDINGS: Tip and side port of the enteric tube below the diaphragm in the stomach. Air-filled dilated small bowel again seen, overall not significantly changed. No evidence of free air. IMPRESSION: Tip and side port of the enteric tube below the diaphragm in the stomach. Electronically Signed   By: Jeb Levering M.D.   On: 06/22/2015 00:37   Dg Abd Portable 1v  06/21/2015  CLINICAL DATA:  59 year old male with nasogastric tube placement. Subsequent encounter. EXAM: PORTABLE ABDOMEN - 1 VIEW COMPARISON:  06/21/2015. FINDINGS: Multiple gas distended small bowel loops may indicate small bowel obstruction. Paucity of distal bowel gas. The possibility of free intraperitoneal air cannot be  assessed on a supine view. Nasogastric tube place with side hole and tip at the gastric fundus level. IMPRESSION: Multiple gas distended small bowel loops may indicate small bowel obstruction. Paucity of distal bowel gas. Distension has progressed with small bowel loops measuring up to 5.3 cm versus 4.7 cm earlier today. Nasogastric tube place with side hole and tip at the gastric fundus level. Electronically Signed   By: Genia Del M.D.   On: 06/21/2015 17:38    Medications / Allergies:  Scheduled Meds: . amLODipine  10 mg Oral Daily  . bisacodyl  10 mg Rectal Once  . calcitRIOL  1 mcg Per Tube Q M,W,F-HD  . calcium acetate  1,334 mg  Oral TID WC  . carvedilol  6.25 mg Oral BID WC  . cinacalcet  30 mg Oral Q supper  . [START ON 06/25/2015] darbepoetin (ARANESP) injection - DIALYSIS  100 mcg Intravenous Q Fri-HD  . diatrizoate meglumine-sodium  90 mL Oral Once  . gi cocktail  30 mL Oral Once  . Glycerin (Adult)  1 suppository Rectal Once  . heparin  5,000 Units Subcutaneous Q8H  . insulin aspart  0-9 Units Subcutaneous TID WC  . isosorbide mononitrate  60 mg Oral Daily  . LORazepam  0.5 mg Oral Once  . multivitamin  1 tablet Oral QHS  . polyethylene glycol  17 g Oral Daily   Continuous Infusions: . dextrose 5 % and 0.45% NaCl 30 mL/hr at 06/23/15 0900   PRN Meds:.sodium chloride, sodium chloride, acetaminophen, alteplase, cloNIDine, gi cocktail, heparin, ketorolac, ondansetron (ZOFRAN) IV  Antibiotics: Anti-infectives    Start     Dose/Rate Route Frequency Ordered Stop   06/17/15 1500  ceFAZolin (ANCEF) IVPB 2g/100 mL premix  Status:  Discontinued    Comments:  Pharmacy may adjust dosing strength, interval, or rate of medication as needed for optimal therapy for the patient  Send with patient on call to the OR.  Anesthesia to complete antibiotic administration <52mn [facility]   2 g 200 mL/hr over 30 Minutes Intravenous On call to O.R. 06/17/15 1019 06/18/15 0837         Assessment/Plan POD# laparoscopic cholecystectomy---Dr. BNinfa LindenPost operative ileus -continue NGT to LIWS, clamp and walk 6x/day.  He is improving very slowly.  -give dulcolax suppository today ->2.5L NGT output, not ready to have NGT clamped. -apparently was told he cannot be mobilized, perhaps he would be better served on a unit where mobilization is encouraged and perforated -CMP and lipase in AM ESRD-HD MWF Cirrhosis/hep C   EErby Pian ANP-BC [facility] Pager [phone](7A-4:30P) For consults and floor pages call [phone](7A-4:30P)  06/23/2015 11:14 AM

## 2015-06-23 NOTE — Progress Notes (Signed)
Bladder scan patient has not vded got 610 ml M.D. Aware se orders for in and out cath.

## 2015-06-23 NOTE — Progress Notes (Signed)
Bladder scan and got >555. Patient does not feel uncomf. Or like he needs to void . Patient tried an ddid not void. COnt. To monitor closely.

## 2015-06-24 ENCOUNTER — Encounter (HOSPITAL_COMMUNITY): Payer: Self-pay | Admitting: Radiology

## 2015-06-24 ENCOUNTER — Encounter (HOSPITAL_COMMUNITY)
Admission: EM | Disposition: A | Discharge status: Home / Self Care | Payer: Self-pay | Source: Home / Self Care | Attending: Family Medicine

## 2015-06-24 ENCOUNTER — Inpatient Hospital Stay (HOSPITAL_COMMUNITY): Payer: Medicaid Other | Admitting: Certified Registered"

## 2015-06-24 ENCOUNTER — Inpatient Hospital Stay (HOSPITAL_COMMUNITY): Payer: Medicaid Other

## 2015-06-24 HISTORY — PX: UMBILICAL HERNIA REPAIR: SHX196

## 2015-06-24 LAB — COMPREHENSIVE METABOLIC PANEL
ALT: 12 U/L — AB (ref 17–63)
AST: 25 U/L (ref 15–41)
Albumin: 3 g/dL — ABNORMAL LOW (ref 3.5–5.0)
Alkaline Phosphatase: 56 U/L (ref 38–126)
Anion gap: 14 (ref 5–15)
BUN: 19 mg/dL (ref 6–20)
CHLORIDE: 91 mmol/L — AB (ref 101–111)
CO2: 32 mmol/L (ref 22–32)
CREATININE: 6.73 mg/dL — AB (ref 0.61–1.24)
Calcium: 9 mg/dL (ref 8.9–10.3)
GFR calc non Af Amer: 8 mL/min — ABNORMAL LOW (ref >60)
GFR, EST AFRICAN AMERICAN: 9 mL/min — AB (ref >60)
Glucose, Bld: 141 mg/dL — ABNORMAL HIGH (ref 65–99)
POTASSIUM: 3.4 mmol/L — AB (ref 3.5–5.1)
SODIUM: 137 mmol/L (ref 135–145)
Total Bilirubin: 1.2 mg/dL (ref 0.3–1.2)
Total Protein: 9.4 g/dL — ABNORMAL HIGH (ref 6.5–8.1)

## 2015-06-24 LAB — GLUCOSE, CAPILLARY
GLUCOSE-CAPILLARY: 154 mg/dL — AB (ref 65–99)
GLUCOSE-CAPILLARY: 151 mg/dL — AB (ref 65–99)
Glucose-Capillary: 140 mg/dL — ABNORMAL HIGH (ref 65–99)
Glucose-Capillary: 153 mg/dL — ABNORMAL HIGH (ref 65–99)

## 2015-06-24 LAB — LIPASE, BLOOD: LIPASE: 18 U/L (ref 11–51)

## 2015-06-24 SURGERY — REPAIR, HERNIA, UMBILICAL, ADULT
Anesthesia: General | Site: Abdomen

## 2015-06-24 MED ORDER — BUPIVACAINE-EPINEPHRINE (PF) 0.5% -1:200000 IJ SOLN
INTRAMUSCULAR | Status: AC
  Filled 2015-06-24: qty 30

## 2015-06-24 MED ORDER — IOPAMIDOL (ISOVUE-300) INJECTION 61%
INTRAVENOUS | Status: AC
  Administered 2015-06-24: 75 mL
  Filled 2015-06-24: qty 100

## 2015-06-24 MED ORDER — SUCCINYLCHOLINE CHLORIDE 20 MG/ML IJ SOLN
INTRAMUSCULAR | Status: DC | PRN
  Administered 2015-06-24: 80 mg via INTRAVENOUS

## 2015-06-24 MED ORDER — FENTANYL CITRATE (PF) 250 MCG/5ML IJ SOLN
INTRAMUSCULAR | Status: AC
  Filled 2015-06-24: qty 5

## 2015-06-24 MED ORDER — ETOMIDATE 2 MG/ML IV SOLN
INTRAVENOUS | Status: AC
  Filled 2015-06-24: qty 10

## 2015-06-24 MED ORDER — NEOSTIGMINE METHYLSULFATE 10 MG/10ML IV SOLN
INTRAVENOUS | Status: DC | PRN
  Administered 2015-06-24: 3 mg via INTRAVENOUS

## 2015-06-24 MED ORDER — BISACODYL 10 MG RE SUPP
10.0000 mg | Freq: Once | RECTAL | Status: AC
  Administered 2015-06-24: 10 mg via RECTAL
  Filled 2015-06-24: qty 1

## 2015-06-24 MED ORDER — FENTANYL CITRATE (PF) 100 MCG/2ML IJ SOLN
INTRAMUSCULAR | Status: DC | PRN
  Administered 2015-06-24: 25 ug via INTRAVENOUS

## 2015-06-24 MED ORDER — ONDANSETRON HCL 4 MG/2ML IJ SOLN
INTRAMUSCULAR | Status: DC | PRN
Start: 2015-06-24 — End: 2015-06-24
  Administered 2015-06-24: 4 mg via INTRAVENOUS

## 2015-06-24 MED ORDER — POTASSIUM CHLORIDE 10 MEQ/100ML IV SOLN
10.0000 meq | Freq: Once | INTRAVENOUS | Status: AC
  Administered 2015-06-24: 10 meq via INTRAVENOUS

## 2015-06-24 MED ORDER — ROCURONIUM BROMIDE 100 MG/10ML IV SOLN
INTRAVENOUS | Status: DC | PRN
  Administered 2015-06-24: 30 mg via INTRAVENOUS

## 2015-06-24 MED ORDER — PROPOFOL 10 MG/ML IV BOLUS
INTRAVENOUS | Status: DC | PRN
  Administered 2015-06-24: 100 mg via INTRAVENOUS

## 2015-06-24 MED ORDER — MIDAZOLAM HCL 2 MG/2ML IJ SOLN
INTRAMUSCULAR | Status: AC
  Filled 2015-06-24: qty 2

## 2015-06-24 MED ORDER — SUCCINYLCHOLINE CHLORIDE 200 MG/10ML IV SOSY
PREFILLED_SYRINGE | INTRAVENOUS | Status: AC
  Filled 2015-06-24: qty 10

## 2015-06-24 MED ORDER — BUPIVACAINE-EPINEPHRINE 0.5% -1:200000 IJ SOLN
INTRAMUSCULAR | Status: DC | PRN
  Administered 2015-06-24: 10 mL

## 2015-06-24 MED ORDER — LIDOCAINE HCL (CARDIAC) 20 MG/ML IV SOLN
INTRAVENOUS | Status: DC | PRN
  Administered 2015-06-24: 40 mg via INTRAVENOUS

## 2015-06-24 MED ORDER — LIDOCAINE 2% (20 MG/ML) 5 ML SYRINGE
INTRAMUSCULAR | Status: AC
  Filled 2015-06-24: qty 5

## 2015-06-24 MED ORDER — PHENYLEPHRINE HCL 10 MG/ML IJ SOLN
10.0000 mg | INTRAVENOUS | Status: DC | PRN
  Administered 2015-06-24: 40 ug/min via INTRAVENOUS

## 2015-06-24 MED ORDER — ROCURONIUM BROMIDE 50 MG/5ML IV SOLN
INTRAVENOUS | Status: AC
  Filled 2015-06-24: qty 1

## 2015-06-24 MED ORDER — PROPOFOL 10 MG/ML IV BOLUS
INTRAVENOUS | Status: AC
  Filled 2015-06-24: qty 20

## 2015-06-24 MED ORDER — SODIUM CHLORIDE 0.9 % IV SOLN
INTRAVENOUS | Status: DC | PRN
  Administered 2015-06-24: 19:00:00 via INTRAVENOUS

## 2015-06-24 MED ORDER — FENTANYL CITRATE (PF) 100 MCG/2ML IJ SOLN: 25.0000 ug | INTRAMUSCULAR | Status: DC | PRN

## 2015-06-24 MED ORDER — GLYCOPYRROLATE 0.2 MG/ML IJ SOLN
INTRAMUSCULAR | Status: DC | PRN
  Administered 2015-06-24: .6 mg via INTRAVENOUS

## 2015-06-24 MED ORDER — MORPHINE SULFATE (PF) 2 MG/ML IV SOLN
2.0000 mg | INTRAVENOUS | Status: DC | PRN
  Administered 2015-06-25 (×2): 2 mg via INTRAVENOUS
  Filled 2015-06-24 (×2): qty 1

## 2015-06-24 MED ORDER — POTASSIUM CHLORIDE 10 MEQ/100ML IV SOLN
10.0000 meq | INTRAVENOUS | Status: DC
  Administered 2015-06-24: 10 meq via INTRAVENOUS
  Filled 2015-06-24 (×2): qty 100

## 2015-06-24 MED ORDER — CEFAZOLIN (ANCEF) 1 G IV SOLR
1.0000 g | INTRAVENOUS | Status: AC
  Administered 2015-06-24: 2 g
  Filled 2015-06-24: qty 1

## 2015-06-24 SURGICAL SUPPLY — 34 items
BLADE SURG ROTATE 9660 (MISCELLANEOUS) IMPLANT
CANISTER SUCTION 2500CC (MISCELLANEOUS) ×3 IMPLANT
CHLORAPREP W/TINT 26ML (MISCELLANEOUS) ×3 IMPLANT
COVER SURGICAL LIGHT HANDLE (MISCELLANEOUS) ×3 IMPLANT
DERMABOND ADVANCED (GAUZE/BANDAGES/DRESSINGS) ×2
DERMABOND ADVANCED .7 DNX12 (GAUZE/BANDAGES/DRESSINGS) ×1 IMPLANT
DRAPE LAPAROTOMY T 98X78 PEDS (DRAPES) ×3 IMPLANT
DRAPE UTILITY XL STRL (DRAPES) ×6 IMPLANT
ELECT REM PT RETURN 9FT ADLT (ELECTROSURGICAL) ×3
ELECTRODE REM PT RTRN 9FT ADLT (ELECTROSURGICAL) ×1 IMPLANT
GLOVE BIO SURGEON STRL SZ8 (GLOVE) ×3 IMPLANT
GLOVE BIOGEL PI IND STRL 8 (GLOVE) ×1 IMPLANT
GLOVE BIOGEL PI INDICATOR 8 (GLOVE) ×2
GOWN STRL REUS W/ TWL LRG LVL3 (GOWN DISPOSABLE) ×1 IMPLANT
GOWN STRL REUS W/ TWL XL LVL3 (GOWN DISPOSABLE) ×1 IMPLANT
GOWN STRL REUS W/TWL LRG LVL3 (GOWN DISPOSABLE) ×2
GOWN STRL REUS W/TWL XL LVL3 (GOWN DISPOSABLE) ×2
KIT BASIN OR (CUSTOM PROCEDURE TRAY) ×3 IMPLANT
KIT ROOM TURNOVER OR (KITS) ×3 IMPLANT
LIQUID BAND (GAUZE/BANDAGES/DRESSINGS) ×3 IMPLANT
NEEDLE 22X1 1/2 (OR ONLY) (NEEDLE) ×3 IMPLANT
NS IRRIG 1000ML POUR BTL (IV SOLUTION) ×3 IMPLANT
PACK GENERAL/GYN (CUSTOM PROCEDURE TRAY) ×3 IMPLANT
PAD ARMBOARD 7.5X6 YLW CONV (MISCELLANEOUS) ×3 IMPLANT
SUT MNCRL AB 4-0 PS2 18 (SUTURE) ×3 IMPLANT
SUT PROLENE 0 CT 1 30 (SUTURE) ×6 IMPLANT
SUT PROLENE 0 CT 2 (SUTURE) ×6 IMPLANT
SUT VIC AB 2-0 CT1 27 (SUTURE) ×2
SUT VIC AB 2-0 CT1 TAPERPNT 27 (SUTURE) ×1 IMPLANT
SUT VIC AB 3-0 SH 27 (SUTURE) ×2
SUT VIC AB 3-0 SH 27XBRD (SUTURE) ×1 IMPLANT
SYR CONTROL 10ML LL (SYRINGE) ×3 IMPLANT
TOWEL OR 17X24 6PK STRL BLUE (TOWEL DISPOSABLE) ×3 IMPLANT
TOWEL OR 17X26 10 PK STRL BLUE (TOWEL DISPOSABLE) ×3 IMPLANT

## 2015-06-24 NOTE — Progress Notes (Signed)
[facility] Progress Note   Subjective: up in chair, no new c/o, NG in  Filed Vitals:   06/23/15 1317 06/23/15 1957 06/24/15 0400 06/24/15 1324  BP: 132/61 128/75 133/82 92/56  Pulse: 88 91 98 88  Temp: 99.5 F (37.5 C) 98 F (36.7 C) 98.3 F (36.8 C) 98.3 F (36.8 C)  TempSrc: Oral Oral Oral Oral  Resp: 18 16 18 18   Weight:      SpO2: 94% 95% 93% 98%    Inpatient medications: . bisacodyl  10 mg Rectal Once  . calcitRIOL  1 mcg Per Tube Q M,W,F-HD  . calcium acetate  1,334 mg Oral TID WC  . carvedilol  6.25 mg Oral BID WC  . cinacalcet  30 mg Oral Q supper  . diatrizoate meglumine-sodium  90 mL Oral Once  . gi cocktail  30 mL Oral Once  . Glycerin (Adult)  1 suppository Rectal Once  . heparin  5,000 Units Subcutaneous Q8H  . insulin aspart  0-9 Units Subcutaneous TID WC  . isosorbide mononitrate  60 mg Oral Daily  . LORazepam  0.5 mg Oral Once  . multivitamin  1 tablet Oral QHS  . polyethylene glycol  17 g Oral Daily   . dextrose 5 % and 0.45% NaCl 30 mL/hr at 06/23/15 0900   acetaminophen, cloNIDine, gi cocktail, ketorolac, ondansetron (ZOFRAN) IV  Exam: Alert, no distress No jvd Chest clear bilat RRR no mrg Abd distended, firm, nontender, dec'd BS No LE edema R IJ cath, RUA AVF +bruit Alert nf ox 3  Dialysis: MWF [facility]  4h 45min   81.5kg  2/2.25 bath  RUA AVF (maturing) / R IJ cath Hep 7000 Calc 1ug Last pth 802  Hb 12  44%sat      Assessment: 1 Cholelithiasis / Lap chole 5/25 - postop ileus, NG tube in 2 ESRD HD mwf, ok to use AVF per [facility], pt has been refusing tho 3 HTN bp's ok on coreg 4 Hx CABG/ ICD/ CM 5 Hyponatremia resolved 6 ^PTH cont med rx 7 Vol under dry wt 2kg   Plan - HD Friday, keep even.  Kara Mead 50/hr   Kelly Splinter MD [facility] pager [phone]    cell [phone] 06/24/2015, 2:17 PM    Recent Labs Lab 06/21/15 0217 06/22/15 0227 06/23/15 0842 06/24/15 0320  NA 133* 135 140 137  K 4.9 3.8 3.6  3.4*  CL 93* 92* 88* 91*  CO2 26 29 38* 32  GLUCOSE 99 69 146* 141*  BUN 31* 17 32* 19  CREATININE 8.08* 5.66* 8.86* 6.73*  CALCIUM 8.8* 8.6* 8.6* 9.0  PHOS 6.8* 5.1* 6.1*  --     Recent Labs Lab 06/20/15 0217  06/22/15 0227 06/23/15 0842 06/24/15 0320  AST 20  --   --   --  25  ALT 11*  --   --   --  12*  ALKPHOS 56  --   --   --  56  BILITOT 1.1  --   --   --  1.2  PROT 9.3*  --   --   --  9.4*  ALBUMIN 3.2*  3.2*  < > 3.1* 3.0* 3.0*  < > = values in this interval not displayed.  Recent Labs Lab 06/21/15 0217 06/22/15 0227 06/23/15 0841  WBC 10.9* 9.5 10.4  HGB 11.2* 11.1* 10.8*  HCT 34.3* 34.3* 33.5*  MCV 90.5 90.5 90.8  PLT 184 205 182

## 2015-06-24 NOTE — Progress Notes (Signed)
Physical Therapy Treatment Patient Details Name: Eric [name] MRN: [id number] DOB: [birth year] Today's Date: July 03, 2015    History of Present Illness Pt admitted with CP and symptomatic cholelithiasis. s/p lap chole 5/25. Pt now with post op ileus and has NG tube. PMH: ESRD, metabolic bone disease,  CABG, ICD, DM, Hep C, liver cirrhosis, seizure.    PT Comments    Steady progress.  Follow Up Recommendations  No PT follow up;Supervision for mobility/OOB     Equipment Recommendations  None recommended by PT (Pt has rolling walker)    Recommendations for Other Services       Precautions / Restrictions Precautions Precautions: Fall Restrictions Weight Bearing Restrictions: No    Mobility  Bed Mobility               General bed mobility comments: Up in chair  Transfers Overall transfer level: Modified independent Equipment used: Rolling walker (2 wheeled)             General transfer comment: Verbal cues for hand placement  Ambulation/Gait Ambulation/Gait assistance: Min guard Ambulation Distance (Feet): 400 Feet Assistive device: Rolling walker (2 wheeled) Gait Pattern/deviations: Step-through pattern;Decreased stride length Gait velocity: decr Gait velocity interpretation: Below normal speed for age/gender General Gait Details: No loss of balance with walker.    Stairs            Wheelchair Mobility    Modified Rankin (Stroke Patients Only)       Balance   Sitting-balance support: No upper extremity supported;Feet supported Sitting balance-Leahy Scale: Good     Standing balance support: No upper extremity supported Standing balance-Leahy Scale: Fair                      Cognition Arousal/Alertness: Awake/alert Behavior During Therapy: Flat affect Overall Cognitive Status: Within Functional Limits for tasks assessed                      Exercises      General Comments        Pertinent Vitals/Pain Pain  Assessment: Faces Faces Pain Scale: Hurts even more Pain Location: abdomen Pain Descriptors / Indicators: Grimacing;Guarding Pain Intervention(s): Limited activity within patient's tolerance;Monitored during session;Repositioned    Home Living                      Prior Function            PT Goals (current goals can now be found in the care plan section) Acute Rehab PT Goals Patient Stated Goal: go home Progress towards PT goals: Progressing toward goals    Frequency  Min 3X/week    PT Plan Current plan remains appropriate    Co-evaluation             End of Session Equipment Utilized During Treatment: Gait belt Activity Tolerance: Patient tolerated treatment well Patient left: in chair;with call bell/phone within reach     Time: 1006-1020 PT Time Calculation (min) (ACUTE ONLY): 14 min  Charges:  $Gait Training: 8-22 mins                    G Codes:      Eric [name] 2015-07-03, 10:33 AM Suanne Marker PT [phone]

## 2015-06-24 NOTE — Anesthesia Preprocedure Evaluation (Addendum)
Anesthesia Evaluation  Patient identified by MRN, date of birth, ID band Patient awake    Reviewed: Allergy & Precautions, NPO status , Patient's Chart, lab work & pertinent test results, reviewed documented beta blocker date and time   History of Anesthesia Complications Negative for: history of anesthetic complications  Airway Mallampati: III  TM Distance: >3 FB Neck ROM: Full    Dental  (+) Dental Advisory Given   Pulmonary shortness of breath, former smoker,    + rhonchi        Cardiovascular hypertension, Pt. on medications and Pt. on home beta blockers + Past MI, + CABG and +CHF  + Cardiac Defibrillator  Rhythm:Regular Rate:Normal  Nuclear stress test 06/16/15:     Defect 1: There is a medium defect of mild severity present in the basal inferior and mid inferior location.     Findings consistent with prior myocardial infarction.     The left ventricular ejection fraction is mildly decreased (45-54%).     Nuclear stress EF: 51%.   Neuro/Psych Seizures -,     GI/Hepatic Neg liver ROS, Small ball obstruction s/p lap chole   Endo/Other  diabetes, Type 2, Insulin Dependent  Renal/GU ESRF and DialysisRenal diseaseM/W/F     Musculoskeletal   Abdominal   Peds  Hematology  (+) anemia ,   Anesthesia Other Findings   Reproductive/Obstetrics                           Anesthesia Physical Anesthesia Plan  ASA: IV and emergent  Anesthesia Plan: General   Post-op Pain Management:    Induction: Intravenous and Rapid sequence  Airway Management Planned: Oral ETT  Additional Equipment: None  Intra-op Plan:   Post-operative Plan: Possible Post-op intubation/ventilation  Informed Consent: I have reviewed the patients History and Physical, chart, labs and discussed the procedure including the risks, benefits and alternatives for the proposed anesthesia with the patient or authorized  representative who has indicated his/her understanding and acceptance.   Dental advisory given  Plan Discussed with: Anesthesiologist, Surgeon and CRNA  Anesthesia Plan Comments:       Anesthesia Quick Evaluation                                   Anesthesia Evaluation  Patient identified by MRN, date of birth, ID band Patient awake    Reviewed: Allergy & Precautions, NPO status , Patient's Chart, lab work & pertinent test results, reviewed documented beta blocker date and time   Airway Mallampati: III  TM Distance: >3 FB Neck ROM: Full    Dental  (+) Dental Advisory Given   Pulmonary shortness of breath, former smoker,    breath sounds clear to auscultation       Cardiovascular hypertension, Pt. on medications and Pt. on home beta blockers + Past MI, + CABG and +CHF  + Cardiac Defibrillator  Rhythm:Regular Rate:Normal  Nuclear stress test 06/16/15:     Defect 1: There is a medium defect of mild severity present in the basal inferior and mid inferior location.     Findings consistent with prior myocardial infarction.     The left ventricular ejection fraction is mildly decreased (45-54%).     Nuclear stress EF: 51%.   Neuro/Psych Seizures -,     GI/Hepatic negative GI ROS, Neg liver ROS,   Endo/Other  diabetes, Type 2  Renal/GU  ESRF and DialysisRenal disease     Musculoskeletal   Abdominal   Peds  Hematology  (+) anemia ,   Anesthesia Other Findings   Reproductive/Obstetrics                            Lab Results  Component Value Date   WBC 10.4 06/23/2015   HGB 10.8* 06/23/2015   HCT 33.5* 06/23/2015   MCV 90.8 06/23/2015   PLT 182 06/23/2015   Lab Results  Component Value Date   CREATININE 6.73* 06/24/2015   BUN 19 06/24/2015   NA 137 06/24/2015   K 3.4* 06/24/2015   CL 91* 06/24/2015   CO2 32 06/24/2015    Anesthesia Physical Anesthesia Plan  ASA: IV  Anesthesia Plan: General   Post-op Pain  Management:    Induction: Intravenous  Airway Management Planned: Oral ETT  Additional Equipment:   Intra-op Plan:   Post-operative Plan: Extubation in OR  Informed Consent: I have reviewed the patients History and Physical, chart, labs and discussed the procedure including the risks, benefits and alternatives for the proposed anesthesia with the patient or authorized representative who has indicated his/her understanding and acceptance.   Dental advisory given  Plan Discussed with: CRNA  Anesthesia Plan Comments:         Anesthesia Quick Evaluation

## 2015-06-24 NOTE — Progress Notes (Signed)
Patient ID: Eric [name], male   DOB: 1955/05/05, 60 y.o.   MRN: [id number] CT A/P shows SBO due to small bowel in an umbilical hernia. Will take to OR for repair incisional umbilical hernia. Procedure, risks, and benefits D/W him and his wife. He agrees.  Georganna Skeans, MD, MPH, FACS Trauma: [phone] General Surgery: [phone]

## 2015-06-24 NOTE — Progress Notes (Signed)
Patient passing some gas and a very small amount of stool after suppository.

## 2015-06-24 NOTE — Anesthesia Procedure Notes (Signed)
Procedure Name: Intubation Performed by: Manuela Schwartz B Pre-anesthesia Checklist: Patient identified, Timeout performed, Emergency Drugs available, Suction available and Patient being monitored Patient Re-evaluated:Patient Re-evaluated prior to inductionOxygen Delivery Method: Circle system utilized Preoxygenation: Pre-oxygenation with 100% oxygen Intubation Type: IV induction and Rapid sequence Laryngoscope Size: Mac and 3 Grade View: Grade I Tube type: Subglottic suction tube Tube size: 8.0 mm Number of attempts: 1 Airway Equipment and Method: Stylet Placement Confirmation: ETT inserted through vocal cords under direct vision,  positive ETCO2 and breath sounds checked- equal and bilateral Secured at: 22 cm Tube secured with: Tape Dental Injury: Teeth and Oropharynx as per pre-operative assessment

## 2015-06-24 NOTE — Transfer of Care (Signed)
Immediate Anesthesia Transfer of Care Note  Patient: Eric [name]  Procedure(s) Performed: Procedure(s): HERNIA REPAIR UMBILICAL ADULT (N/A)  Patient Location: PACU  Anesthesia Type:General  Level of Consciousness: awake, alert  and oriented  Airway & Oxygen Therapy: Patient Spontanous Breathing and Patient connected to nasal cannula oxygen  Post-op Assessment: Report given to RN and Post -op Vital signs reviewed and stable  Post vital signs: Reviewed and stable  Last Vitals:  Filed Vitals:   06/24/15 1324 06/24/15 2028  BP: 92/56 130/80  Pulse: 88 90  Temp: 36.8 C 36.9 C  Resp: 18 19    Last Pain:  Filed Vitals:   06/24/15 2029  PainSc: 0-No pain      Patients Stated Pain Goal: 4 ([id number])  Complications: No apparent anesthesia complications

## 2015-06-24 NOTE — Progress Notes (Signed)
[place] spoke to Dr. Jonnie Finner from Nephrology. Patient on dialysis but has refused last several times. Per Dr. Jonnie Finner ok to give patient IV contrast for CT exam.

## 2015-06-24 NOTE — Progress Notes (Signed)
Family Medicine Teaching Service Daily Progress Note Intern Pager: [phone]  Patient name: Eric [name] Medical record number: [id number] Date of birth: [birth year] Age: 60 y.o. Gender: male  Primary Care Provider: [name], NP Consultants: None Code Status: Full  Pt Overview and Major Events to Date:  5/22: admit to [facility] 5/25: cholecystectomy  5/29: NG tube placed due to post-op ileus  Assessment and Plan: Betzalel Dohner is a 60 y.o. male presenting with chest pain/HTN . PMH is significant for HTN, HLD, Cardiac arrest, HFrEF, AICD, MI (2015), ESRD, Type 2 DM, CABG (1999), Hepatitis C (cirrhosis)  S/p cholecystectomy, POD7, now with post-op ileus:  LFTs and lipase wnl today obtained ensure no pancreatitis driving ileus. Dulcolax suppository 5/31 without output but allowed him to pass gas.  - NG tube (placed 5/29 evening) to LIS: 1,493 ml ouptput from NG tube (improving from 3L) - Surgery consulted, appreciate reccs: another dulcolax suppository today, continue NG LIS  - Zofran for nausea.  - Toradol 15- 30 mg every 6 hours prn for 5 days, only used on 5/29 and 5/31 - Ambulate 6 times daily per instructions   Urinary Retention: none noted overnight with bladder scan  - Bladder scan q4hrs, if greater than 200 ml, then in/out cath.   HFrEF: EF 45-50%, moderate concentric hypertrophy, Systolic pressure was mildly to moderately increased. PA peak pressure: 47 mm Hg (S). Volume well controlled with dialysis - MWF Dialysis for volume control - Continue Coreg, IMDUR (held, note below)  - Holding home hydralazine   HTN, stable: Blood pressures have been stable over the past 24 hours while patient's anti-hypertensives were being held. Unlikely he is absorbing medication in the setting of NG and ileus.   - continue Coreg - holding hydralazine and coreg - holding IMDUR because unable to be crushed and given via NG - Clonidine BID PRN  - will monitor closely; consider restarting  held medications if blood pressures elevated  ESRD: Nephrology following. M/W/F dialysis - last Dialysis 5/31 - AV fistula analyzed by vascular, good flow. - Renal panel daily  T2DM. Most recent A1c 5.9 (02/2015). Holding Glipizide, CBGs 117-154, AM 154 - CBGs daily since NPO - discontinued sliding scale, will administer insulin if CBG is significantly elevated  Hypokalemia: 3.4 this AM - IV K 6mEq x 2  - Will follow with labs  FEN/GI: NPO with ice chips, D5-NS @ 30 ml  Prophylaxis: heparin SBQ  Disposition: pending improvement of ileus.    Subjective:  Patient is frustrated today about his NG tube. Only passed gas after suppository yesterday afternoon. Walked 4 times yesterday. Reports no pain but uncomfortable when laying in bed. Bloating has somewhat improved.   Objective: Temp:  [98 F (36.7 C)-99.5 F (37.5 C)] 98.3 F (36.8 C) (06/01 0400) Pulse Rate:  [84-98] 98 (06/01 0400) Resp:  [16-22] 18 (06/01 0400) BP: (97-155)/(61-91) 133/82 mmHg (06/01 0400) SpO2:  [93 %-96 %] 93 % (06/01 0400) Weight:  [79.1 kg (174 lb 6.1 oz)-79.3 kg (174 lb 13.2 oz)] 79.1 kg (174 lb 6.1 oz) (05/31 1143) Physical Exam: General: Patient sitting in chair after walking in hallway, NAD Cardiovascular: RRR, no murmurs  Respiratory: CTAB, no wheezing or rhnochi Abdomen: BS+, distended, tender to palpation mainly on left side of abdomen, mild guarding at times; no rebound/peritoneal signs.   Extremities: moving all extremities   Laboratory:  Recent Labs Lab 06/21/15 0217 06/22/15 0227 06/23/15 0841  WBC 10.9* 9.5 10.4  HGB 11.2* 11.1* 10.8*  HCT  34.3* 34.3* 33.5*  PLT 184 205 182    Recent Labs Lab 06/20/15 0217  06/22/15 0227 06/23/15 0842 06/24/15 0320  NA 131*  < > 135 140 137  K 4.7  < > 3.8 3.6 3.4*  CL 93*  < > 92* 88* 91*  CO2 23  < > 29 38* 32  BUN 21*  < > 17 32* 19  CREATININE 6.03*  < > 5.66* 8.86* 6.73*  CALCIUM 8.8*  < > 8.6* 8.6* 9.0  PROT 9.3*  --   --   --   9.4*  BILITOT 1.1  --   --   --  1.2  ALKPHOS 56  --   --   --  56  ALT 11*  --   --   --  12*  AST 20  --   --   --  25  GLUCOSE 73  < > 69 146* 141*  < > = values in this interval not displayed.    No results for input(s): TROPONINI in the last 168 hours.   Imaging/Diagnostic Tests: Dg Abd 1 View  06/22/2015  CLINICAL DATA:  Impaired nasogastric feeding tube, subsequent encounter EXAM: ABDOMEN - 1 VIEW COMPARISON:  06/22/2015 FINDINGS: An enteric tube crosses the gastroesophageal junction with tip in the left upper quadrant most consistent with gastric location. Numerous dilated loops of small bowel are again noted consistent with small-bowel obstruction. Previous maximal diameter was 47 mm. Current maximal diameter is 51 mm. IMPRESSION: Enteric tube as described.  Persistent small-bowel obstruction. Electronically Signed   By: Skipper Cliche M.D.   On: 06/22/2015 13:03   Dg Abd Portable 1v-small Bowel Obstruction Protocol-initial, 8 Hr Delay  06/23/2015  CLINICAL DATA:  Follow-up small bowel obstruction, 8 hour delayed image EXAM: PORTABLE ABDOMEN - 1 VIEW COMPARISON:  Two supine abdominal images dated Jun 22, 2015 FINDINGS: There remain loops of mildly distended gas-filled small bowel with maximal diameter of approximately 44 mm. Some of the orally administered contrast likely lies in small bowel in the right mid abdomen. No significant colonic gas is observed in the ascending and transverse colons. There is a small amount of gas and stool in the descending colon which is stable. No free extraluminal gas collections are observed. There is an esophagogastric tube present whose tip lies in the mid body of the stomach. There are radiodensities that project over the left mid abdomen. There are surgical clips in the gallbladder fossa. IMPRESSION: The degree of gaseous distention of small bowel loops has decreased slightly. No free extraluminal gas collections are observed. Electronically Signed    By: David  Martinique M.D.   On: 06/23/2015 07:49    Smiley Houseman, MD 06/24/2015, 6:36 AM PGY-1, [facility] Intern pager: [phone], text pages welcome

## 2015-06-24 NOTE — Progress Notes (Signed)
Call placed to [facility] surgery answering service.  Message left for Dr. Redmond Pulling regarding SBO.  Await further instruction.

## 2015-06-24 NOTE — Op Note (Signed)
06/14/2015 - 06/24/2015  8:19 PM  PATIENT:  Eric [name]  60 y.o. male  PRE-OPERATIVE DIAGNOSIS:  Incisional umbilical hernia with small bowel obstruction  POST-OPERATIVE DIAGNOSIS:  Incisional umbilical hernia with small bowel obstruction  PROCEDURE:  Procedure(s): HERNIA REPAIR UMBILICAL ADULT  SURGEON:  Surgeon(s): Georganna Skeans, MD  ASSISTANTS: none   ANESTHESIA:   general  EBL:     BLOOD ADMINISTERED:none  DRAINS: none   SPECIMEN:  No Specimen  DISPOSITION OF SPECIMEN:  N/A  COUNTS:  YES  DICTATION: .Dragon Dictation Findings: viable small bowel in small incisional umbilical hernia  Procedure in detail: [name] underwent laparoscopic cholecystectomy by Dr. Ninfa Linden on May 25. Postoperatively he has been treated for an ileus. This did not improve so he underwent CT scan of the abdomen and pelvis earlier this evening. This demonstrated small bowel obstruction due to a knuckle of small bowel in an incisional umbilical hernia. He is brought for emergent repair. Informed consent was obtained. He was identified in the preop holding area. He received intravenous and buttocks. He was brought to the operative room and general endotracheal anesthesia was administered by the anesthesia staff. His abdomen was prepped and draped in a sterile fashion. We did a time out procedure. Incisional glue was removed from his infraumbilical incision. The subcuticular suture was removed. The incision was gently opened revealing a knuckle of small bowel. The incision was enlarged. The small bowel was gently reduced back into the abdomen. It was completely viable. This was done without difficulty. It was noted to have good peristalsis and viability on observation back in the abdomen. The fascial defect was then closed with multiple interrupted 0 Prolene sutures. Subcutaneous tissues were irrigated and then closed with running 3-0 Vicryl followed by 4-0 Monocryl subcuticular stitch and liquid band. All  counts were correct. He tolerated the procedure well without apparent complications and was taken recovery in stable condition. PATIENT DISPOSITION:  PACU - hemodynamically stable.   Delay start of Pharmacological VTE agent (>24hrs) due to surgical blood loss or risk of bleeding:  no  Georganna Skeans, MD, MPH, FACS Pager: [phone]  6/1/20178:19 PM

## 2015-06-24 NOTE — Progress Notes (Signed)
Call received from Cat scan, Pt with SBO.  Call placed to teaching service.  Teaching service notified.  Continue NG tube to suction.

## 2015-06-24 NOTE — Progress Notes (Signed)
[facility] Progress Note  7 Days Post-Op  Subjective: Pt sitting up in chair. Denies abdominal pain. No flatus since yesterday. NG to LIWS. ambulated twice today with walker and about to ambulate the halls again. Pt states that he had increased flatus after ducolax yesterday.  24h NG output: 1,025 mL and brown.  No fever/tachycardia overnight AST/ALT/lipase WNL   Objective: Vital signs in last 24 hours: Temp:  [98 F (36.7 C)-99.5 F (37.5 C)] 98.3 F (36.8 C) (06/01 0400) Pulse Rate:  [87-98] 98 (06/01 0400) Resp:  [16-21] 18 (06/01 0400) BP: (104-138)/(61-87) 133/82 mmHg (06/01 0400) SpO2:  [93 %-95 %] 93 % (06/01 0400) Weight:  [79.1 kg (174 lb 6.1 oz)] 79.1 kg (174 lb 6.1 oz) (05/31 1143) Last BM Date: 06/17/15  Intake/Output from previous day: 05/31 0701 - 06/01 0700 In: -  Out: [id number] [Emesis/NG output:1025] Intake/Output this shift:    PE: Gen:  Alert, NAD, pleasant Abd: Soft, NT, very distended, hypoactive bowel sounds, laparoscopic incisions C/D/I. No evidence of peritonitis. Ext:  No erythema, edema, or tenderness  Lab Results:   Recent Labs  06/22/15 0227 06/23/15 0841  WBC 9.5 10.4  HGB 11.1* 10.8*  HCT 34.3* 33.5*  PLT 205 182   BMET  Recent Labs  06/23/15 0842 06/24/15 0320  NA 140 137  K 3.6 3.4*  CL 88* 91*  CO2 38* 32  GLUCOSE 146* 141*  BUN 32* 19  CREATININE 8.86* 6.73*  CALCIUM 8.6* 9.0   PT/INR No results for input(s): LABPROT, INR in the last 72 hours. CMP     Component Value Date/Time   NA 137 06/24/2015 0320   K 3.4* 06/24/2015 0320   CL 91* 06/24/2015 0320   CO2 32 06/24/2015 0320   GLUCOSE 141* 06/24/2015 0320   BUN 19 06/24/2015 0320   CREATININE 6.73* 06/24/2015 0320   CALCIUM 9.0 06/24/2015 0320   PROT 9.4* 06/24/2015 0320   ALBUMIN 3.0* 06/24/2015 0320   AST 25 06/24/2015 0320   ALT 12* 06/24/2015 0320   ALKPHOS 56 06/24/2015 0320   BILITOT 1.2 06/24/2015 0320   GFRNONAA 8* 06/24/2015 0320   GFRAA 9* 06/24/2015 0320   Lipase     Component Value Date/Time   LIPASE 18 06/24/2015 0320       Studies/Results: Dg Abd 1 View  06/22/2015  CLINICAL DATA:  Impaired nasogastric feeding tube, subsequent encounter EXAM: ABDOMEN - 1 VIEW COMPARISON:  06/22/2015 FINDINGS: An enteric tube crosses the gastroesophageal junction with tip in the left upper quadrant most consistent with gastric location. Numerous dilated loops of small bowel are again noted consistent with small-bowel obstruction. Previous maximal diameter was 47 mm. Current maximal diameter is 51 mm. IMPRESSION: Enteric tube as described.  Persistent small-bowel obstruction. Electronically Signed   By: Skipper Cliche M.D.   On: 06/22/2015 13:03   Dg Abd Portable 1v-small Bowel Obstruction Protocol-initial, 8 Hr Delay  06/23/2015  CLINICAL DATA:  Follow-up small bowel obstruction, 8 hour delayed image EXAM: PORTABLE ABDOMEN - 1 VIEW COMPARISON:  Two supine abdominal images dated Jun 22, 2015 FINDINGS: There remain loops of mildly distended gas-filled small bowel with maximal diameter of approximately 44 mm. Some of the orally administered contrast likely lies in small bowel in the right mid abdomen. No significant colonic gas is observed in the ascending and transverse colons. There is a small amount of gas and stool in the descending colon which is stable. No free extraluminal gas collections are observed. There  is an esophagogastric tube present whose tip lies in the mid body of the stomach. There are radiodensities that project over the left mid abdomen. There are surgical clips in the gallbladder fossa. IMPRESSION: The degree of gaseous distention of small bowel loops has decreased slightly. No free extraluminal gas collections are observed. Electronically Signed   By: David  Martinique M.D.   On: 06/23/2015 07:49    Anti-infectives: Anti-infectives    Start     Dose/Rate Route Frequency Ordered Stop   06/17/15 1500  ceFAZolin (ANCEF)  IVPB 2g/100 mL premix  Status:  Discontinued    Comments:  Pharmacy may adjust dosing strength, interval, or rate of medication as needed for optimal therapy for the patient  Send with patient on call to the OR.  Anesthesia to complete antibiotic administration <72min prior to incision per [facility].   2 g 200 mL/hr over 30 Minutes Intravenous On call to O.R. 06/17/15 1019 06/18/15 0837       Assessment/Plan POD#7 laparoscopic cholecystectomy---Dr. Ninfa Linden Post operative ileus -continue NGT to LIWS, clamp and walk 6x/day. He is improving very slowly.  -give another dulcolax suppository today - still > 1L NGT output and limited flatus/bowel sounds, not ready to have NGT clamped. -Lipase: 18 U/L;  AST 25 U/L; ALT 12 U/L  ESRD-HD MWF  Cirrhosis/hep C   LOS: 9 days    Jill Alexanders , [facility] 06/24/2015, 10:50 AM Pager: [phone] Mon-Fri 7:00 am-4:30 pm Sat-Sun 7:00 am-11:30 am

## 2015-06-24 NOTE — Progress Notes (Signed)
Pt prepared for surgery.  Consent signed.  CHG bath given.  Foley inserted for urinary retention.  Report called.

## 2015-06-25 ENCOUNTER — Encounter (HOSPITAL_COMMUNITY): Payer: Self-pay | Admitting: General Surgery

## 2015-06-25 ENCOUNTER — Encounter: Payer: Self-pay | Admitting: Vascular Surgery

## 2015-06-25 DIAGNOSIS — R140 Abdominal distension (gaseous): Secondary | ICD-10-CM

## 2015-06-25 DIAGNOSIS — K913 Postprocedural intestinal obstruction: Secondary | ICD-10-CM

## 2015-06-25 LAB — GLUCOSE, CAPILLARY
GLUCOSE-CAPILLARY: 137 mg/dL — AB (ref 65–99)
GLUCOSE-CAPILLARY: 154 mg/dL — AB (ref 65–99)
GLUCOSE-CAPILLARY: 114 mg/dL — AB (ref 65–99)
GLUCOSE-CAPILLARY: 92 mg/dL (ref 65–99)
GLUCOSE-CAPILLARY: 86 mg/dL (ref 65–99)

## 2015-06-25 LAB — CBC
HCT: 31 % — ABNORMAL LOW (ref 39.0–52.0)
HEMOGLOBIN: 9.5 g/dL — AB (ref 13.0–17.0)
MCH: 28.7 pg (ref 26.0–34.0)
MCHC: 30.6 g/dL (ref 30.0–36.0)
MCV: 93.7 fL (ref 78.0–100.0)
PLATELETS: 186 10*3/uL (ref 150–400)
RBC: 3.31 MIL/uL — AB (ref 4.22–5.81)
RDW: 13.8 % (ref 11.5–15.5)
WBC: 4.6 10*3/uL (ref 4.0–10.5)

## 2015-06-25 LAB — BASIC METABOLIC PANEL
Anion gap: 15 (ref 5–15)
BUN: 38 mg/dL — AB (ref 6–20)
CO2: 31 mmol/L (ref 22–32)
CREATININE: 9.35 mg/dL — AB (ref 0.61–1.24)
Calcium: 8.5 mg/dL — ABNORMAL LOW (ref 8.9–10.3)
Chloride: 91 mmol/L — ABNORMAL LOW (ref 101–111)
GFR calc Af Amer: 6 mL/min — ABNORMAL LOW (ref >60)
GFR, EST NON AFRICAN AMERICAN: 5 mL/min — AB (ref >60)
Glucose, Bld: 157 mg/dL — ABNORMAL HIGH (ref 65–99)
POTASSIUM: 3.5 mmol/L (ref 3.5–5.1)
SODIUM: 137 mmol/L (ref 135–145)

## 2015-06-25 MED ORDER — LIDOCAINE-PRILOCAINE 2.5-2.5 % EX CREA: 1.0000 "application " | TOPICAL_CREAM | CUTANEOUS | Status: DC | PRN

## 2015-06-25 MED ORDER — OXYCODONE-ACETAMINOPHEN 5-325 MG PO TABS: 1.0000 | ORAL_TABLET | ORAL | Status: DC | PRN

## 2015-06-25 MED ORDER — LIDOCAINE HCL (PF) 1 % IJ SOLN: 5.0000 mL | INTRAMUSCULAR | Status: DC | PRN

## 2015-06-25 MED ORDER — KETOROLAC TROMETHAMINE 15 MG/ML IJ SOLN
INTRAMUSCULAR | Status: AC
  Filled 2015-06-25: qty 1

## 2015-06-25 MED ORDER — PENTAFLUOROPROP-TETRAFLUOROETH EX AERO: 1.0000 "application " | INHALATION_SPRAY | CUTANEOUS | Status: DC | PRN

## 2015-06-25 MED ORDER — DEXTROSE-NACL 5-0.45 % IV SOLN
INTRAVENOUS | Status: DC
  Administered 2015-06-25: 22:00:00 via INTRAVENOUS
  Administered 2015-06-25: 45 mL/h via INTRAVENOUS

## 2015-06-25 MED ORDER — SODIUM CHLORIDE 0.9 % IV SOLN: 100.0000 mL | INTRAVENOUS | Status: DC | PRN

## 2015-06-25 MED ORDER — HEPARIN SODIUM (PORCINE) 1000 UNIT/ML DIALYSIS: 1000.0000 [IU] | INTRAMUSCULAR | Status: DC | PRN

## 2015-06-25 MED ORDER — ALTEPLASE 2 MG IJ SOLR: 2.0000 mg | Freq: Once | INTRAMUSCULAR | Status: DC | PRN

## 2015-06-25 MED ORDER — KETOROLAC TROMETHAMINE 15 MG/ML IJ SOLN
15.0000 mg | Freq: Once | INTRAMUSCULAR | Status: AC
  Administered 2015-06-25: 15 mg via INTRAVENOUS

## 2015-06-25 MED ORDER — CALCITRIOL 1 MCG/ML IV SOLN
INTRAVENOUS | Status: AC
  Filled 2015-06-25: qty 1

## 2015-06-25 NOTE — Progress Notes (Signed)
1 Day Post-Op  Subjective: He feels much better, + BM since surgery, but still draining fair amount from the NG.  Sites look fine , he has a foley in, not sure if this was for surgery or not.    Objective: Vital signs in last 24 hours: Temp:  [97.9 F (36.6 C)-98.8 F (37.1 C)] 97.9 F (36.6 C) (06/02 0622) Pulse Rate:  [81-92] 81 (06/02 0622) Resp:  [7-20] 17 (06/02 0622) BP: (92-136)/(56-80) 136/71 mmHg (06/02 0622) SpO2:  [98 %-100 %] 100 % (06/02 0622) Weight:  [77.021 kg (169 lb 12.8 oz)] 77.021 kg (169 lb 12.8 oz) (06/02 0424) Last BM Date: 06/25/15 NPO Urine 50 NG 950 Stool x 4 since surgery recorded Afebrile, VSS Labs OK Intake/Output from previous day: 06/01 0701 - 06/02 0700 In: 700 [I.V.:700] Out: 1010 [Urine:50; Emesis/NG output:950; Blood:10] Intake/Output this shift: Total I/O In: -  Out: 150 [Emesis/NG output:150]  General appearance: alert, cooperative, no distress and NG still draining some.   GI: abdominal sites all look good, few BS, still hypoactive.  he just go morphine but doesn't want to go to HD.    Lab Results:   Recent Labs  06/23/15 0841  WBC 10.4  HGB 10.8*  HCT 33.5*  PLT 182    BMET  Recent Labs  06/24/15 0320 06/25/15 0324  NA 137 137  K 3.4* 3.5  CL 91* 91*  CO2 32 31  GLUCOSE 141* 157*  BUN 19 38*  CREATININE 6.73* 9.35*  CALCIUM 9.0 8.5*   PT/INR No results for input(s): LABPROT, INR in the last 72 hours.   Recent Labs Lab 06/20/15 0217 06/21/15 0217 06/22/15 0227 06/23/15 0842 06/24/15 0320  AST 20  --   --   --  25  ALT 11*  --   --   --  12*  ALKPHOS 56  --   --   --  56  BILITOT 1.1  --   --   --  1.2  PROT 9.3*  --   --   --  9.4*  ALBUMIN 3.2*  3.2* 3.0* 3.1* 3.0* 3.0*     Lipase     Component Value Date/Time   LIPASE 18 06/24/2015 0320     Studies/Results: Ct Abdomen Pelvis W Contrast  06/24/2015  CLINICAL DATA:  Postoperative ileus EXAM: CT ABDOMEN AND PELVIS WITH CONTRAST TECHNIQUE:  Multidetector CT imaging of the abdomen and pelvis was performed using the standard protocol following bolus administration of intravenous contrast. CONTRAST:  1 ISOVUE-300 IOPAMIDOL (ISOVUE-300) INJECTION 61% COMPARISON:  06/15/2015 FINDINGS: There is an NG tube in the stomach. Small bowel is distended to a diameter of about 4 cm. In the periumbilical region there is a small hernia containing small bowel. Bowel proximal to the hernia is distended, and bowel distal to the hernia is tightly decompressed. Only a short segment of small bowel measuring about 6 cm is involved in the hernia. No definite evidence of strangulation currently. Colon is relatively decompressed. Significant diverticulosis is seen throughout the distal half of the large bowel. Patient is status post cholecystectomy. Liver, spleen, and pancreas are normal. Fullness of both adrenal glands suggesting possibility of hyperplasia, stable. Severe left renal atrophy. Right kidney normal. No acute vascular abnormalities.  No acute osseous abnormalities. Bladder is normal.  No free fluid. Extensive infiltrate right middle and lower lobe. 6 mm right middle lobe pulmonary nodule seen on prior study stable on the current exam. Less extensive consolidation is also seen  in the left lower lobe. No significant pleural fluid. IMPRESSION: 1. Small bowel obstruction, high grade, due to abdominal wall hernia in the periumbilical region. 2. Bilateral pulmonary parenchymal opacities. This could reflect pulmonary edema, pneumonitis, ARDS, etc. 3. 6 mm pulmonary nodule on the right Non-contrast chest CT at 6-12 months is recommended. If the nodule is stable at time of repeat CT, then future CT at 18-24 months (from today's scan) is considered optional for low-risk patients, but is recommended for high-risk patients. This recommendation follows the consensus statement: Guidelines for Management of Incidental Pulmonary Nodules Detected on CT Images:From the Fleischner  Society 2017; published online before print (10.1148/radiol.SG:5268862). Critical Value/emergent results were called by telephone at the time of interpretation on 06/24/2015 at 5:45 pm to Sonia Baller, the patient's nurse, who will inform the referring physician. Who verbally acknowledged these results. Electronically Signed   By: Skipper Cliche M.D.   On: 06/24/2015 17:52    Medications: . bisacodyl  10 mg Rectal Once  . calcitRIOL  1 mcg Per Tube Q M,W,F-HD  . calcium acetate  1,334 mg Oral TID WC  . carvedilol  6.25 mg Oral BID WC  . cinacalcet  30 mg Oral Q supper  . diatrizoate meglumine-sodium  90 mL Oral Once  . gi cocktail  30 mL Oral Once  . Glycerin (Adult)  1 suppository Rectal Once  . heparin  5,000 Units Subcutaneous Q8H  . insulin aspart  0-9 Units Subcutaneous TID WC  . isosorbide mononitrate  60 mg Oral Daily  . LORazepam  0.5 mg Oral Once  . multivitamin  1 tablet Oral QHS  . polyethylene glycol  17 g Oral Daily   . dextrose 5 % and 0.45% NaCl 75 mL/hr at 06/25/15 [id number]   Assessment/Plan Incisional umbilical hernia with small bowel obstruction Umbilical hernia repair [id number], Dr. Georganna Skeans Symptomatic cholecystitis with laparoscopic cholecystectomy, 06/17/15, Dr. Ninfa Linden ESRD on HD  MWF Cirrhosis/Hepatitis C Hx of CABG/ICD/CM FEN:  NPO/IV fluids ID:  Pre op only DVT:  Heparin   Plan:  I will d/c foley and clamp NG I talked with the nurse and if he get sick we can restart suction.  Start him on ice chips and sips with the NG still in.  I d/ced the suppositories and Miralax.  Added Percocet.     LOS: 10 days    Halah Whiteside 06/25/2015 [phone]

## 2015-06-25 NOTE — Progress Notes (Signed)
He is up for dialysis now.  He has not received any clears so far, not sure why.  He still has very few BS, and he still feel bad, still somewhat distended.  I plan to resume suction after HD and see how he does, continue clamping trials over night.  I told them to allow him some clears after he get back from HD.

## 2015-06-25 NOTE — Progress Notes (Signed)
PT Cancellation Note  Patient Details Name: Eric [name] MRN: [id number] DOB: May 22, 1955   Cancelled Treatment:    Reason Eval/Treat Not Completed: Patient at procedure or test/unavailable   Kian Ottaviano 06/25/2015, 2:41 PM [facility] PT [phone]

## 2015-06-25 NOTE — Progress Notes (Signed)
Family Medicine Teaching Service Daily Progress Note Intern Pager: [phone]  Patient name: Eric [name] Medical record number: [id number] Date of birth: [birth year] Age: 60 y.o. Gender: male  Primary Care Provider: [name], NP Consultants: None Code Status: Full  Pt Overview and Major Events to Date:  5/22: admit to [facility] 5/25: cholecystectomy  5/29: NG tube placed due to post-op ileus 6/1: Repeat CT: high gradeSBO due to small bowel in umbilical hernia-> to OR for repair incisional umbilical hernia  Assessment and Plan: Eric [name] is a 60 y.o. male presenting with chest pain/HTN . PMH is significant for HTN, HLD, Cardiac arrest, HFrEF, AICD, MI (2015), ESRD, Type 2 DM, CABG (1999), Hepatitis C (cirrhosis)  S/p cholecystectomy (POD7), with post-op ileus due to small bowel umbilical hernia s/p repair (POD1), improved:   - NG tube (placed 5/29 evening) to LIS: 950 ml ouptput from NG tube; Clamp today (per surgery) with ice chips and sips.  - Surgery consulted, appreciate reccs: Clamp today (per surgery) with ice chips and sips.  - Zofran for nausea.  - Toradol 15- 30 mg every 6 hours prn for 5 days, only used on 5/29, 5/31, and 6/1 - Percocet PRN by surgery; discontinued Morphine this AM - Ambulate 6 times daily per instructions   Urinary Retention: foley in overnight due to surgery, discontinued this morning.  - Bladder scan q4hrs, if greater than 200 ml, then in/out cath.   HFrEF: EF 45-50%, moderate concentric hypertrophy, Systolic pressure was mildly to moderately increased. PA peak pressure: 47 mm Hg (S). Volume well controlled with dialysis - MWF Dialysis for volume control - Continue Coreg, IMDUR (held, note below)  - Holding home hydralazine   HTN: Had a soft blood pressure overnight but improved and stable in the morning   - continue Coreg - holding hydralazine  - holding IMDUR because unable to be crushed and given via NG - Clonidine BID PRN  - will  monitor closely; consider restarting held medications if blood pressures elevated  ESRD: Nephrology following. M/W/F dialysis - planned dialysis today  - AV fistula analyzed by vascular, good flow. - Renal panel daily  T2DM. Most recent A1c 5.9 (02/2015). Holding Glipizide. AM CBG 154 (given 2 units of Novolog) - CBGs daily since NPO - discontinued sliding scale, will administer insulin if CBG is significantly elevated  Hypokalemia, resolved. 3.5 this AM  - Will follow with labs  FEN/GI: NPO with ice chips, D5-1/2NS @ 75 ml/hr  Prophylaxis: heparin SBQ  Disposition: pending improvement of ileus.    Subjective:  Had surgery overnight to repair incisional umbilical hernia likely contributing to SBO. After surgery, overnight had multiple loose BMs. Patient began to get weak, not as alert and respondiong to questions late and briefly, patient nodded off and said yes with sternal rub. VS BP 95/57, HR 92, RR16, 98%2L. Rapid response was called; patient was helped back to bed. Repeat vitals 98.61F, 109/64, 84HR, 100% 2L. Vitals q 2 and increased IVF overnight. Patient's blood pressure improved.   Abdomen feels much better today and less bloated. Only mild abdominal pain, no nausea. Is passing gas. Stool x 4 early in the morning. Is in better spirits. Family at bedside.  Has foley in today- reports placed for surgery.   Objective: Temp:  [97.9 F (36.6 C)-98.8 F (37.1 C)] 97.9 F (36.6 C) (06/02 0622) Pulse Rate:  [81-92] 81 (06/02 0622) Resp:  [7-20] 17 (06/02 0622) BP: (92-136)/(56-80) 136/71 mmHg (06/02 0622) SpO2:  [98 %-100 %]  100 % (06/02 0622) Weight:  [77.021 kg (169 lb 12.8 oz)] 77.021 kg (169 lb 12.8 oz) (06/02 0424) Physical Exam: General: laying in bed, NAD Cardiovascular: RRR, no murmurs  Respiratory: diminished breath sounds at bases due to distended stomach, no wheezing or rhnochi Abdomen: BS+, soft and less distended,no tenderness to palpation, no rebound/peritoneal  signs.   Extremities: moving all extremities   Laboratory:  Recent Labs Lab 06/21/15 0217 06/22/15 0227 06/23/15 0841  WBC 10.9* 9.5 10.4  HGB 11.2* 11.1* 10.8*  HCT 34.3* 34.3* 33.5*  PLT 184 205 182    Recent Labs Lab 06/20/15 0217  06/23/15 0842 06/24/15 0320 06/25/15 0324  NA 131*  < > 140 137 137  K 4.7  < > 3.6 3.4* 3.5  CL 93*  < > 88* 91* 91*  CO2 23  < > 38* 32 31  BUN 21*  < > 32* 19 38*  CREATININE 6.03*  < > 8.86* 6.73* 9.35*  CALCIUM 8.8*  < > 8.6* 9.0 8.5*  PROT 9.3*  --   --  9.4*  --   BILITOT 1.1  --   --  1.2  --   ALKPHOS 56  --   --  56  --   ALT 11*  --   --  12*  --   AST 20  --   --  25  --   GLUCOSE 73  < > 146* 141* 157*  < > = values in this interval not displayed.    No results for input(s): TROPONINI in the last 168 hours.   Imaging/Diagnostic Tests: Ct Abdomen Pelvis W Contrast  06/24/2015  CLINICAL DATA:  Postoperative ileus EXAM: CT ABDOMEN AND PELVIS WITH CONTRAST TECHNIQUE: Multidetector CT imaging of the abdomen and pelvis was performed using the standard protocol following bolus administration of intravenous contrast. CONTRAST:  1 ISOVUE-300 IOPAMIDOL (ISOVUE-300) INJECTION 61% COMPARISON:  06/15/2015 FINDINGS: There is an NG tube in the stomach. Small bowel is distended to a diameter of about 4 cm. In the periumbilical region there is a small hernia containing small bowel. Bowel proximal to the hernia is distended, and bowel distal to the hernia is tightly decompressed. Only a short segment of small bowel measuring about 6 cm is involved in the hernia. No definite evidence of strangulation currently. Colon is relatively decompressed. Significant diverticulosis is seen throughout the distal half of the large bowel. Patient is status post cholecystectomy. Liver, spleen, and pancreas are normal. Fullness of both adrenal glands suggesting possibility of hyperplasia, stable. Severe left renal atrophy. Right kidney normal. No acute vascular  abnormalities.  No acute osseous abnormalities. Bladder is normal.  No free fluid. Extensive infiltrate right middle and lower lobe. 6 mm right middle lobe pulmonary nodule seen on prior study stable on the current exam. Less extensive consolidation is also seen in the left lower lobe. No significant pleural fluid. IMPRESSION: 1. Small bowel obstruction, high grade, due to abdominal wall hernia in the periumbilical region. 2. Bilateral pulmonary parenchymal opacities. This could reflect pulmonary edema, pneumonitis, ARDS, etc. 3. 6 mm pulmonary nodule on the right Non-contrast chest CT at 6-12 months is recommended. If the nodule is stable at time of repeat CT, then future CT at 18-24 months (from today's scan) is considered optional for low-risk patients, but is recommended for high-risk patients. This recommendation follows the consensus statement: Guidelines for Management of Incidental Pulmonary Nodules Detected on CT Images:From the Fleischner Society 2017; published online before print (10.1148/radiol.IJ:2314499).  Critical Value/emergent results were called by telephone at the time of interpretation on 06/24/2015 at 5:45 pm to Sonia Baller, the patient's nurse, who will inform the referring physician. Who verbally acknowledged these results. Electronically Signed   By: Skipper Cliche M.D.   On: 06/24/2015 17:52    Smiley Houseman, MD 06/25/2015, 6:29 AM PGY-1, [place] Intern pager: [phone], text pages welcome

## 2015-06-25 NOTE — Progress Notes (Addendum)
[facility] Progress Note   Subjective: to OR last night for SBO umb hernia repair  Filed Vitals:   06/25/15 0218 06/25/15 0224 06/25/15 0424 06/25/15 0622  BP: 95/67 109/64 127/70 136/71  Pulse: 92 84 86 81  Temp: 98.8 F (37.1 C) 98.8 F (37.1 C) 98.2 F (36.8 C) 97.9 F (36.6 C)  TempSrc: Oral Oral Oral Oral  Resp: 16 18 16 17   Weight:   77.021 kg (169 lb 12.8 oz)   SpO2: 98% 100% 100% 100%    Inpatient medications: . calcitRIOL  1 mcg Per Tube Q M,W,F-HD  . calcium acetate  1,334 mg Oral TID WC  . carvedilol  6.25 mg Oral BID WC  . cinacalcet  30 mg Oral Q supper  . diatrizoate meglumine-sodium  90 mL Oral Once  . gi cocktail  30 mL Oral Once  . heparin  5,000 Units Subcutaneous Q8H  . insulin aspart  0-9 Units Subcutaneous TID WC  . isosorbide mononitrate  60 mg Oral Daily  . LORazepam  0.5 mg Oral Once  . multivitamin  1 tablet Oral QHS   . dextrose 5 % and 0.45% NaCl 75 mL/hr at 06/25/15 [id number]   acetaminophen, cloNIDine, gi cocktail, ketorolac, ondansetron (ZOFRAN) IV, oxyCODONE-acetaminophen  Exam: Alert, no distress No jvd Chest clear bilat RRR no mrg Abd distended, firm, nontender, dec'd BS No LE edema R IJ cath, RUA AVF +bruit Alert nf ox 3  Dialysis: MWF [facility]  4h 59min   81.5kg  2/2.25 bath  RUA AVF (maturing) / R IJ cath Hep 7000 Calc 1ug Last pth 802  Hb 12  44%sat      Assessment: 1 Cholelithiasis / Lap chole 5/25 2 SBO w umb hernia - sp exlap 6/1 3 ESRD HD mwf, ok to use AVF per [facility], pt has been refusing tho 4 HTN bp's ok on coreg 5 Hyponatremia resolved 6 ^PTH cont med rx 7 Vol under dry wt, no vol excess 8 Hx CABG/ ICD/ CM  Plan - reduce IVF's.  HD today , no UF. Hold coreg.    Kelly Splinter MD [facility] pager [phone]    cell [phone] 06/25/2015, 10:18 AM    Recent Labs Lab 06/21/15 0217 06/22/15 0227 06/23/15 0842 06/24/15 0320 06/25/15 0324  NA 133* 135 140 137 137  K 4.9 3.8 3.6 3.4*  3.5  CL 93* 92* 88* 91* 91*  CO2 26 29 38* 32 31  GLUCOSE 99 69 146* 141* 157*  BUN 31* 17 32* 19 38*  CREATININE 8.08* 5.66* 8.86* 6.73* 9.35*  CALCIUM 8.8* 8.6* 8.6* 9.0 8.5*  PHOS 6.8* 5.1* 6.1*  --   --     Recent Labs Lab 06/20/15 0217  06/22/15 0227 06/23/15 0842 06/24/15 0320  AST 20  --   --   --  25  ALT 11*  --   --   --  12*  ALKPHOS 56  --   --   --  56  BILITOT 1.1  --   --   --  1.2  PROT 9.3*  --   --   --  9.4*  ALBUMIN 3.2*  3.2*  < > 3.1* 3.0* 3.0*  < > = values in this interval not displayed.  Recent Labs Lab 06/21/15 0217 06/22/15 0227 06/23/15 0841  WBC 10.9* 9.5 10.4  HGB 11.2* 11.1* 10.8*  HCT 34.3* 34.3* 33.5*  MCV 90.5 90.5 90.8  PLT 184 205 182

## 2015-06-25 NOTE — Progress Notes (Signed)
Pt having upset stomach needed to have a BM. Pt on bed pan and BM soft. Pt had two more BM's that were loose. Pt had another accident and soiled the entire bed. Got pt to [facility]. Pt began to get weak, not as alert, responding to questions late and briefly. Pt nodding off, but says yes with sternal rub. VS taken 95/67 92 HR, 16 RR, 98%NC2L. Rapid response called. Pt still somnolent and only responding with head nods. Pt helped back to bed with 4 staff, gait belt, and Rapid Response nurse. VS taken again 98.38F, 109/64, 84HR, 100NL2L, 18RR. Pt back in bed resting more alert talking to his daughter and myself. Pt states he tired and his stomach is upset. Rapid response advised RN to call MD on call to make aware of patients condition.  MD ordered to check VS q2h, bedrest, and increase d5, 0.45% NS to 75 ml.hr. Will continue to monitor pt. Pt resting in bed with call bell in reach, no apparent distress, wife and daughter at bedside.

## 2015-06-25 NOTE — Progress Notes (Signed)
Hemodioalysis: Patient terminated treatment 1:13 minutes early. Md aware. Papers eigned.

## 2015-06-26 DIAGNOSIS — Z0189 Encounter for other specified special examinations: Secondary | ICD-10-CM

## 2015-06-26 LAB — GLUCOSE, CAPILLARY
GLUCOSE-CAPILLARY: 92 mg/dL (ref 65–99)
GLUCOSE-CAPILLARY: 113 mg/dL — AB (ref 65–99)
Glucose-Capillary: 143 mg/dL — ABNORMAL HIGH (ref 65–99)
Glucose-Capillary: 179 mg/dL — ABNORMAL HIGH (ref 65–99)

## 2015-06-26 LAB — CBC
HCT: 33 % — ABNORMAL LOW (ref 39.0–52.0)
Hemoglobin: 10.4 g/dL — ABNORMAL LOW (ref 13.0–17.0)
MCH: 29.5 pg (ref 26.0–34.0)
MCHC: 31.5 g/dL (ref 30.0–36.0)
MCV: 93.8 fL (ref 78.0–100.0)
PLATELETS: 192 10*3/uL (ref 150–400)
RBC: 3.52 MIL/uL — AB (ref 4.22–5.81)
RDW: 14 % (ref 11.5–15.5)
WBC: 7.8 10*3/uL (ref 4.0–10.5)

## 2015-06-26 LAB — BASIC METABOLIC PANEL
ANION GAP: 13 (ref 5–15)
BUN: 31 mg/dL — ABNORMAL HIGH (ref 6–20)
CO2: 25 mmol/L (ref 22–32)
Calcium: 7.9 mg/dL — ABNORMAL LOW (ref 8.9–10.3)
Chloride: 97 mmol/L — ABNORMAL LOW (ref 101–111)
Creatinine, Ser: 7.77 mg/dL — ABNORMAL HIGH (ref 0.61–1.24)
GFR calc Af Amer: 8 mL/min — ABNORMAL LOW (ref >60)
GFR, EST NON AFRICAN AMERICAN: 7 mL/min — AB (ref >60)
Glucose, Bld: 112 mg/dL — ABNORMAL HIGH (ref 65–99)
POTASSIUM: 4 mmol/L (ref 3.5–5.1)
SODIUM: 135 mmol/L (ref 135–145)

## 2015-06-26 MED ORDER — MORPHINE SULFATE (PF) 2 MG/ML IV SOLN
2.0000 mg | INTRAVENOUS | Status: DC | PRN
  Administered 2015-06-26 – 2015-06-28 (×3): 2 mg via INTRAVENOUS
  Filled 2015-06-26 (×3): qty 1

## 2015-06-26 NOTE — Progress Notes (Signed)
Family Medicine Teaching Service Daily Progress Note Intern Pager: [phone]  Patient name: Eric [name] Medical record number: [id number] Date of birth: [birth year] Age: 60 y.o. Gender: male  Primary Care Provider: [name], NP Consultants: None Code Status: Full  Pt Overview and Major Events to Date:  5/22: admit to [place] 5/25: cholecystectomy  5/29: NG tube placed due to post-op ileus 6/1: Repeat CT: high gradeSBO due to small bowel in umbilical hernia-> to OR for repair incisional umbilical hernia  Assessment and Plan: Vamsi Biter is a 60 y.o. male presenting with chest pain/HTN . PMH is significant for HTN, HLD, Cardiac arrest, HFrEF, AICD, MI (2015), ESRD, Type 2 DM, CABG (1999), Hepatitis C (cirrhosis)  S/p cholecystectomy (POD7), with post-op ileus due to small bowel umbilical hernia s/p repair (POD1), improved:   - NG out. - Clears > advance again tomorrow as able.  - Surgery following - Zofran for nausea.  - avoiding narcotics - Percocet PRN by surgery - Ambulate 6 times daily per instructions  - Monitor for dehydration. D5 1/2NS at 45cc/hr.   Urinary Retention: foley in overnight due to surgery, discontinued this morning.  - Bladder scan q4hrs, if greater than 200 ml, then in/out cath.   HFrEF: EF 45-50%, moderate concentric hypertrophy, Systolic pressure was mildly to moderately increased. PA peak pressure: 47 mm Hg (S). Volume well controlled with dialysis - MWF Dialysis for volume control - Continue Coreg, IMDUR (held, note below)  - Holding home hydralazine   HTN: Had a soft blood pressure overnight but improved and stable in the morning   - continue Coreg - holding hydralazine  - holding IMDUR because unable to be crushed and given via NG - Clonidine BID PRN  - will monitor closely; consider restarting held medications if blood pressures elevated  ESRD: Nephrology following. M/W/F dialysis - planned dialysis today  - AV fistula analyzed by  vascular, good flow. - Renal panel daily  T2DM. Most recent A1c 5.9 (02/2015). Holding Glipizide. AM CBG 154 (given 2 units of Novolog) - CBGs daily since NPO - discontinued sliding scale, will administer insulin if CBG is significantly elevated  Hypokalemia, resolved. 3.5 this AM  - Will follow with labs  FEN/GI: NPO with ice chips, D5-1/2NS @ 75 ml/hr  Prophylaxis: heparin SBQ  Disposition: pending improvement of ileus.    Subjective:  Feeling better with the ng tube out. Eating breakfast on my entrance.   Objective: Temp:  [97.9 F (36.6 C)-98.6 F (37 C)] 98.6 F (37 C) (06/03 0232) Pulse Rate:  [79-84] 83 (06/03 0232) Resp:  [17-18] 17 (06/03 0232) BP: (108-167)/(55-98) 142/73 mmHg (06/03 0232) SpO2:  [92 %-96 %] 92 % (06/03 0232) Weight:  [172 lb 6.4 oz (78.2 kg)-176 lb 12.9 oz (80.2 kg)] 176 lb 12.9 oz (80.2 kg) (06/03 0232) Physical Exam: General: up in chair, NAD Cardiovascular: RRR, no murmurs, gallops, or rubs Respiratory: CTA B, appropriate rate, unlabored.  Abdomen: BS+, soft nondistended, no tenderness to palpation, no rebound/peritoneal signs.   Extremities: moving all extremities    Laboratory:  Recent Labs Lab 06/22/15 0227 06/23/15 0841 06/25/15 1700  WBC 9.5 10.4 4.6  HGB 11.1* 10.8* 9.5*  HCT 34.3* 33.5* 31.0*  PLT 205 182 186    Recent Labs Lab 06/20/15 0217  06/23/15 0842 06/24/15 0320 06/25/15 0324  NA 131*  < > 140 137 137  K 4.7  < > 3.6 3.4* 3.5  CL 93*  < > 88* 91* 91*  CO2 23  < >  38* 32 31  BUN 21*  < > 32* 19 38*  CREATININE 6.03*  < > 8.86* 6.73* 9.35*  CALCIUM 8.8*  < > 8.6* 9.0 8.5*  PROT 9.3*  --   --  9.4*  --   BILITOT 1.1  --   --  1.2  --   ALKPHOS 56  --   --  56  --   ALT 11*  --   --  12*  --   AST 20  --   --  25  --   GLUCOSE 73  < > 146* 141* 157*  < > = values in this interval not displayed.    No results for input(s): TROPONINI in the last 168 hours.   Imaging/Diagnostic Tests: Ct Abdomen Pelvis W  Contrast  06/24/2015  CLINICAL DATA:  Postoperative ileus EXAM: CT ABDOMEN AND PELVIS WITH CONTRAST TECHNIQUE: Multidetector CT imaging of the abdomen and pelvis was performed using the standard protocol following bolus administration of intravenous contrast. CONTRAST:  1 ISOVUE-300 IOPAMIDOL (ISOVUE-300) INJECTION 61% COMPARISON:  06/15/2015 FINDINGS: There is an NG tube in the stomach. Small bowel is distended to a diameter of about 4 cm. In the periumbilical region there is a small hernia containing small bowel. Bowel proximal to the hernia is distended, and bowel distal to the hernia is tightly decompressed. Only a short segment of small bowel measuring about 6 cm is involved in the hernia. No definite evidence of strangulation currently. Colon is relatively decompressed. Significant diverticulosis is seen throughout the distal half of the large bowel. Patient is status post cholecystectomy. Liver, spleen, and pancreas are normal. Fullness of both adrenal glands suggesting possibility of hyperplasia, stable. Severe left renal atrophy. Right kidney normal. No acute vascular abnormalities.  No acute osseous abnormalities. Bladder is normal.  No free fluid. Extensive infiltrate right middle and lower lobe. 6 mm right middle lobe pulmonary nodule seen on prior study stable on the current exam. Less extensive consolidation is also seen in the left lower lobe. No significant pleural fluid. IMPRESSION: 1. Small bowel obstruction, high grade, due to abdominal wall hernia in the periumbilical region. 2. Bilateral pulmonary parenchymal opacities. This could reflect pulmonary edema, pneumonitis, ARDS, etc. 3. 6 mm pulmonary nodule on the right Non-contrast chest CT at 6-12 months is recommended. If the nodule is stable at time of repeat CT, then future CT at 18-24 months (from today's scan) is considered optional for low-risk patients, but is recommended for high-risk patients. This recommendation follows the consensus  statement: Guidelines for Management of Incidental Pulmonary Nodules Detected on CT Images:From the Fleischner Society 2017; published online before print (10.1148/radiol.IJ:2314499). Critical Value/emergent results were called by telephone at the time of interpretation on 06/24/2015 at 5:45 pm to Sonia Baller, the patient's nurse, who will inform the referring physician. Who verbally acknowledged these results. Electronically Signed   By: Skipper Cliche M.D.   On: 06/24/2015 17:52    Aquilla Hacker, MD 06/26/2015, 8:42 AM PGY-2, [place] Intern pager: [phone], text pages welcome

## 2015-06-26 NOTE — Progress Notes (Signed)
2 Days Post-Op  Subjective: Order for clears never got thru, instructions to do clamping trial was also not really done.  He is passing gas, no nausea or distension.  I pulled NG and order clears again.  Objective: Vital signs in last 24 hours: Temp:  [97.9 F (36.6 C)-98.6 F (37 C)] 98.6 F (37 C) (06/03 0232) Pulse Rate:  [79-84] 83 (06/03 0232) Resp:  [17-18] 17 (06/03 0232) BP: (108-167)/(55-98) 142/73 mmHg (06/03 0232) SpO2:  [92 %-96 %] 92 % (06/03 0232) Weight:  [78.2 kg (172 lb 6.4 oz)-80.2 kg (176 lb 12.9 oz)] 80.2 kg (176 lb 12.9 oz) (06/03 0232) Last BM Date: 06/25/15 150 per ng recorded nothing else recorded Afebrile, VSS No labs Intake/Output from previous day: 06/02 0701 - 06/03 0700 In: 0  Out: -54 [Emesis/NG output:150] Intake/Output this shift:    General appearance: alert, cooperative and no distress GI: soft, still somewhat distended, + BS this AM, he reports flatus, sites all look good    some drainage on Umbilical site dried up.    Lab Results:   Recent Labs  06/23/15 0841 06/25/15 1700  WBC 10.4 4.6  HGB 10.8* 9.5*  HCT 33.5* 31.0*  PLT 182 186    BMET  Recent Labs  06/24/15 0320 06/25/15 0324  NA 137 137  K 3.4* 3.5  CL 91* 91*  CO2 32 31  GLUCOSE 141* 157*  BUN 19 38*  CREATININE 6.73* 9.35*  CALCIUM 9.0 8.5*   PT/INR No results for input(s): LABPROT, INR in the last 72 hours.   Recent Labs Lab 06/20/15 0217 06/21/15 0217 06/22/15 0227 06/23/15 0842 06/24/15 0320  AST 20  --   --   --  25  ALT 11*  --   --   --  12*  ALKPHOS 56  --   --   --  56  BILITOT 1.1  --   --   --  1.2  PROT 9.3*  --   --   --  9.4*  ALBUMIN 3.2*  3.2* 3.0* 3.1* 3.0* 3.0*     Lipase     Component Value Date/Time   LIPASE 18 06/24/2015 0320     Studies/Results: Ct Abdomen Pelvis W Contrast  06/24/2015  CLINICAL DATA:  Postoperative ileus EXAM: CT ABDOMEN AND PELVIS WITH CONTRAST TECHNIQUE: Multidetector CT imaging of the abdomen and  pelvis was performed using the standard protocol following bolus administration of intravenous contrast. CONTRAST:  1 ISOVUE-300 IOPAMIDOL (ISOVUE-300) INJECTION 61% COMPARISON:  06/15/2015 FINDINGS: There is an NG tube in the stomach. Small bowel is distended to a diameter of about 4 cm. In the periumbilical region there is a small hernia containing small bowel. Bowel proximal to the hernia is distended, and bowel distal to the hernia is tightly decompressed. Only a short segment of small bowel measuring about 6 cm is involved in the hernia. No definite evidence of strangulation currently. Colon is relatively decompressed. Significant diverticulosis is seen throughout the distal half of the large bowel. Patient is status post cholecystectomy. Liver, spleen, and pancreas are normal. Fullness of both adrenal glands suggesting possibility of hyperplasia, stable. Severe left renal atrophy. Right kidney normal. No acute vascular abnormalities.  No acute osseous abnormalities. Bladder is normal.  No free fluid. Extensive infiltrate right middle and lower lobe. 6 mm right middle lobe pulmonary nodule seen on prior study stable on the current exam. Less extensive consolidation is also seen in the left lower lobe. No significant pleural  fluid. IMPRESSION: 1. Small bowel obstruction, high grade, due to abdominal wall hernia in the periumbilical region. 2. Bilateral pulmonary parenchymal opacities. This could reflect pulmonary edema, pneumonitis, ARDS, etc. 3. 6 mm pulmonary nodule on the right Non-contrast chest CT at 6-12 months is recommended. If the nodule is stable at time of repeat CT, then future CT at 18-24 months (from today's scan) is considered optional for low-risk patients, but is recommended for high-risk patients. This recommendation follows the consensus statement: Guidelines for Management of Incidental Pulmonary Nodules Detected on CT Images:From the Fleischner Society 2017; published online before print  (10.1148/radiol.IJ:2314499). Critical Value/emergent results were called by telephone at the time of interpretation on 06/24/2015 at 5:45 pm to Sonia Baller, the patient's nurse, who will inform the referring physician. Who verbally acknowledged these results. Electronically Signed   By: Skipper Cliche M.D.   On: 06/24/2015 17:52    Medications: . calcitRIOL  1 mcg Per Tube Q M,W,F-HD  . calcium acetate  1,334 mg Oral TID WC  . cinacalcet  30 mg Oral Q supper  . diatrizoate meglumine-sodium  90 mL Oral Once  . gi cocktail  30 mL Oral Once  . heparin  5,000 Units Subcutaneous Q8H  . insulin aspart  0-9 Units Subcutaneous TID WC  . isosorbide mononitrate  60 mg Oral Daily  . LORazepam  0.5 mg Oral Once  . multivitamin  1 tablet Oral QHS    Assessment/Plan Incisional umbilical hernia with small bowel obstruction Umbilical hernia repair [id number], Dr. Georganna Skeans Symptomatic cholecystitis with laparoscopic cholecystectomy, 06/17/15, Dr. Ninfa Linden ESRD on HD MWF Cirrhosis/Hepatitis C Hx of CABG/ICD/CM FEN: ice chips and sips with meds/IV fluids ID: Pre op only DVT: Heparin   Plan:  i pulled the NG and will start clears.       LOS: 11 days    Kanyia Heaslip 06/26/2015 [phone]

## 2015-06-26 NOTE — Progress Notes (Signed)
  [facility] Progress Note   Subjective: didn't tol HD yest due to pain, got 1.5 hours HD  Filed Vitals:   06/25/15 1631 06/25/15 2008 06/26/15 0232 06/26/15 0900  BP: 113/56 125/98 142/73 109/67  Pulse: 84 79 83   Temp: 98 F (36.7 C) 97.9 F (36.6 C) 98.6 F (37 C)   TempSrc: Oral Oral Oral   Resp: 18 17 17    Weight: 78.2 kg (172 lb 6.4 oz)  80.2 kg (176 lb 12.9 oz)   SpO2: 96% 94% 92%     Inpatient medications: . calcitRIOL  1 mcg Per Tube Q M,W,F-HD  . calcium acetate  1,334 mg Oral TID WC  . cinacalcet  30 mg Oral Q supper  . diatrizoate meglumine-sodium  90 mL Oral Once  . gi cocktail  30 mL Oral Once  . heparin  5,000 Units Subcutaneous Q8H  . insulin aspart  0-9 Units Subcutaneous TID WC  . isosorbide mononitrate  60 mg Oral Daily  . LORazepam  0.5 mg Oral Once  . multivitamin  1 tablet Oral QHS     acetaminophen, cloNIDine, gi cocktail, morphine injection, ondansetron (ZOFRAN) IV, oxyCODONE-acetaminophen  Exam: Alert, no distress No jvd Chest clear bilat RRR no mrg Abd distended, firm, nontender, dec'd BS No LE edema R IJ cath, RUA AVF +bruit Alert nf ox 3  Dialysis: MWF [facility]  4h 74min   81.5kg  2/2.25 bath  RUA AVF (maturing) / R IJ cath Hep 7000 Calc 1ug Last pth 802  Hb 12  44%sat      Assessment: 1 Lap chole 5/25 2 Postop SBO w umb hernia - sp exlap 6/1 3 ESRD HD mwf, ok to use AVF per VVS, pt has been refusing tho 4 HTN bp's good, off meds 5 Hyponatremia resolved 6 ^PTH cont med rx 7 Vol jsut under dry, stable 8 Hx CABG/ ICD/ CM  Plan - next HD Monday   Kelly Splinter MD [facility] pager [phone]    cell [phone] 06/26/2015, 11:52 AM    Recent Labs Lab 06/21/15 0217 06/22/15 0227 06/23/15 0842 06/24/15 0320 06/25/15 0324 06/26/15 0943  NA 133* 135 140 137 137 135  K 4.9 3.8 3.6 3.4* 3.5 4.0  CL 93* 92* 88* 91* 91* 97*  CO2 26 29 38* 32 31 25  GLUCOSE 99 69 146* 141* 157* 112*  BUN 31* 17 32*  19 38* 31*  CREATININE 8.08* 5.66* 8.86* 6.73* 9.35* 7.77*  CALCIUM 8.8* 8.6* 8.6* 9.0 8.5* 7.9*  PHOS 6.8* 5.1* 6.1*  --   --   --     Recent Labs Lab 06/20/15 0217  06/22/15 0227 06/23/15 0842 06/24/15 0320  AST 20  --   --   --  25  ALT 11*  --   --   --  12*  ALKPHOS 56  --   --   --  56  BILITOT 1.1  --   --   --  1.2  PROT 9.3*  --   --   --  9.4*  ALBUMIN 3.2*  3.2*  < > 3.1* 3.0* 3.0*  < > = values in this interval not displayed.  Recent Labs Lab 06/23/15 0841 06/25/15 1700 06/26/15 0943  WBC 10.4 4.6 7.8  HGB 10.8* 9.5* 10.4*  HCT 33.5* 31.0* 33.0*  MCV 90.8 93.7 93.8  PLT 182 186 192

## 2015-06-27 LAB — GLUCOSE, CAPILLARY
GLUCOSE-CAPILLARY: 118 mg/dL — AB (ref 65–99)
GLUCOSE-CAPILLARY: 121 mg/dL — AB (ref 65–99)
GLUCOSE-CAPILLARY: 142 mg/dL — AB (ref 65–99)
Glucose-Capillary: 176 mg/dL — ABNORMAL HIGH (ref 65–99)
Glucose-Capillary: 109 mg/dL — ABNORMAL HIGH (ref 65–99)

## 2015-06-27 LAB — BASIC METABOLIC PANEL
Anion gap: 13 (ref 5–15)
BUN: 33 mg/dL — AB (ref 6–20)
CALCIUM: 7 mg/dL — AB (ref 8.9–10.3)
CHLORIDE: 90 mmol/L — AB (ref 101–111)
CO2: 23 mmol/L (ref 22–32)
CREATININE: 7.91 mg/dL — AB (ref 0.61–1.24)
GFR, EST AFRICAN AMERICAN: 8 mL/min — AB (ref >60)
GFR, EST NON AFRICAN AMERICAN: 7 mL/min — AB (ref >60)
Glucose, Bld: 528 mg/dL — ABNORMAL HIGH (ref 65–99)
Potassium: 3.2 mmol/L — ABNORMAL LOW (ref 3.5–5.1)
SODIUM: 126 mmol/L — AB (ref 135–145)

## 2015-06-27 LAB — CBC
HCT: 25.7 % — ABNORMAL LOW (ref 39.0–52.0)
HEMOGLOBIN: 8.1 g/dL — AB (ref 13.0–17.0)
MCH: 29 pg (ref 26.0–34.0)
MCHC: 31.5 g/dL (ref 30.0–36.0)
MCV: 92.1 fL (ref 78.0–100.0)
PLATELETS: 168 10*3/uL (ref 150–400)
RBC: 2.79 MIL/uL — ABNORMAL LOW (ref 4.22–5.81)
RDW: 13.8 % (ref 11.5–15.5)
WBC: 6.6 10*3/uL (ref 4.0–10.5)

## 2015-06-27 MED ORDER — POTASSIUM CHLORIDE CRYS ER 20 MEQ PO TBCR
40.0000 meq | EXTENDED_RELEASE_TABLET | Freq: Two times a day (BID) | ORAL | Status: AC
  Administered 2015-06-27 (×2): 40 meq via ORAL
  Filled 2015-06-27 (×2): qty 2

## 2015-06-27 NOTE — Progress Notes (Signed)
Family Medicine Teaching Service Daily Progress Note Intern Pager: [phone]  Patient name: Ruffin Berzins Medical record number: [id number] Date of birth: Mar 23, 1955 Age: 60 y.o. Gender: male  Primary Care Provider: [name], NP Consultants: None Code Status: Full  Pt Overview and Major Events to Date:  5/22: admit to [facility] 5/25: cholecystectomy  5/29: NG tube placed due to post-op ileus 6/1: Repeat CT: high gradeSBO due to small bowel in umbilical hernia-> to OR for repair incisional umbilical hernia 6/3: NGT out  Assessment and Plan: Jatavius Manchego is a 60 y.o. male presenting with chest pain/HTN . PMH is significant for HTN, HLD, Cardiac arrest, HFrEF, AICD, MI (2015), ESRD, Type 2 DM, CABG (1999), Hepatitis C (cirrhosis)  S/p cholecystectomy (POD7), with post-op ileus due to small bowel umbilical hernia s/p repair (POD1), improved:   - NG out. - Clears --> soft diet today - Surgery following - Zofran for nausea.  - avoiding narcotics - Percocet PRN by surgery - Ambulate 6 times daily per instructions  - Monitor for dehydration. D5 1/2NS at 45cc/hr.   ESRD: Nephrology following. M/W/F dialysis - planned dialysis tomorrow ( Monday 6/5) - AV fistula analyzed by vascular, good flow. - Renal panel daily  Urinary Retention: foley in overnight due to surgery, discontinued this morning.  - Bladder scan q4hrs, if greater than 200 ml, then in/out cath.   HFrEF: EF 45-50%, moderate concentric hypertrophy, Systolic pressure was mildly to moderately increased. PA peak pressure: 47 mm Hg (S). Volume well controlled with dialysis - MWF Dialysis for volume control - Continue Coreg, continue imdur as NGT out and tolerating PO - Holding home hydralazine   HTN: Had a soft blood pressure overnight but improved and stable in the morning   - continue Coreg - holding hydralazine  - continue imdur - Clonidine BID PRN  - will monitor closely; consider restarting held medications  if blood pressures elevated  T2DM. Most recent A1c 5.9 (02/2015). Holding Glipizide. AM CBG 154 (given 2 units of Novolog) - CBGs daily since NPO - discontinued sliding scale, will administer insulin if CBG is significantly elevated  Hypokalemia, resolved. 3.2 this AM  - replete with PO kdur - Will follow with labs  FEN/GI: soft diet, sliv Prophylaxis: heparin SBQ  Disposition: pending improvement of ileus.    Subjective:  Feeling better with the ng tube out.    Objective: Temp:  [98.4 F (36.9 C)-98.6 F (37 C)] 98.4 F (36.9 C) (06/04 0525) Pulse Rate:  [84-88] 88 (06/03 2109) Resp:  [14-18] 18 (06/04 0525) BP: (109-144)/(66-82) 135/78 mmHg (06/04 0525) SpO2:  [95 %-97 %] 96 % (06/04 0525) Weight:  [175 lb 4.8 oz (79.516 kg)] 175 lb 4.8 oz (79.516 kg) (06/04 0525) Physical Exam: General: up in chair, NAD Cardiovascular: RRR, no murmurs, gallops, or rubs Respiratory: CTA B, appropriate rate, unlabored.  Abdomen: BS+, soft nondistended, no tenderness to palpation, no rebound/peritoneal signs.  Surgical wounds clean/dry and intact Extremities: moving all extremities    Laboratory:  Recent Labs Lab 06/25/15 1700 06/26/15 0943 06/27/15 0219  WBC 4.6 7.8 6.6  HGB 9.5* 10.4* 8.1*  HCT 31.0* 33.0* 25.7*  PLT 186 192 168    Recent Labs Lab 06/24/15 0320 06/25/15 0324 06/26/15 0943 06/27/15 0219  NA 137 137 135 126*  K 3.4* 3.5 4.0 3.2*  CL 91* 91* 97* 90*  CO2 32 31 25 23   BUN 19 38* 31* 33*  CREATININE 6.73* 9.35* 7.77* 7.91*  CALCIUM 9.0 8.5* 7.9*  7.0*  PROT 9.4*  --   --   --   BILITOT 1.2  --   --   --   ALKPHOS 56  --   --   --   ALT 12*  --   --   --   AST 25  --   --   --   GLUCOSE 141* 157* 112* 528*      No results for input(s): TROPONINI in the last 168 hours.   Imaging/Diagnostic Tests: No results found.  Veatrice Bourbon, MD 06/27/2015, 7:30 AM PGY-2, [facility] Intern pager: [phone], text pages welcome

## 2015-06-27 NOTE — Progress Notes (Signed)
3 Days Post-Op  Subjective: Feels better.  Tolerating clear liquids without nausea.  Had a bowel movement. Feels ready to advance diet  We appreciate all of the attention and management by family medicine and nephrology. Objective: Vital signs in last 24 hours: Temp:  [98.4 F (36.9 C)-98.6 F (37 C)] 98.4 F (36.9 C) (06/04 0525) Pulse Rate:  [84-88] 88 (06/03 2109) Resp:  [14-18] 18 (06/04 0525) BP: (109-144)/(66-82) 135/78 mmHg (06/04 0525) SpO2:  [95 %-97 %] 96 % (06/04 0525) Weight:  [79.516 kg (175 lb 4.8 oz)] 79.516 kg (175 lb 4.8 oz) (06/04 0525) Last BM Date: 06/26/15  Intake/Output from previous day:   Intake/Output this shift:    General appearance: Alert.  Coughing intermittently.  Otherwise no distress. GI: Abdomen soft.  Minimally tender around umbilicus.  Umbilical incision intact.  No signs of infection or recurrent hernia.  Small amount serous drainage.  Active bowel sounds.  Lab Results:  Results for orders placed or performed during the hospital encounter of 06/14/15 (from the past 24 hour(s))  CBC     Status: Abnormal   Collection Time: 06/26/15  9:43 AM  Result Value Ref Range   WBC 7.8 4.0 - 10.5 K/uL   RBC 3.52 (L) 4.22 - 5.81 MIL/uL   Hemoglobin 10.4 (L) 13.0 - 17.0 g/dL   HCT 33.0 (L) 39.0 - 52.0 %   MCV 93.8 78.0 - 100.0 fL   MCH 29.5 26.0 - 34.0 pg   MCHC 31.5 30.0 - 36.0 g/dL   RDW 14.0 11.5 - 15.5 %   Platelets 192 150 - 400 K/uL  Basic metabolic panel     Status: Abnormal   Collection Time: 06/26/15  9:43 AM  Result Value Ref Range   Sodium 135 135 - 145 mmol/L   Potassium 4.0 3.5 - 5.1 mmol/L   Chloride 97 (L) 101 - 111 mmol/L   CO2 25 22 - 32 mmol/L   Glucose, Bld 112 (H) 65 - 99 mg/dL   BUN 31 (H) 6 - 20 mg/dL   Creatinine, Ser 7.77 (H) 0.61 - 1.24 mg/dL   Calcium 7.9 (L) 8.9 - 10.3 mg/dL   GFR calc non Af Amer 7 (L) >60 mL/min   GFR calc Af Amer 8 (L) >60 mL/min   Anion gap 13 5 - 15  Glucose, capillary     Status: Abnormal   Collection Time: 06/26/15 11:07 AM  Result Value Ref Range   Glucose-Capillary 143 (H) 65 - 99 mg/dL   Comment 1 Notify RN    Comment 2 Document in Chart   Glucose, capillary     Status: Abnormal   Collection Time: 06/26/15  4:28 PM  Result Value Ref Range   Glucose-Capillary 113 (H) 65 - 99 mg/dL   Comment 1 Notify RN    Comment 2 Document in Chart   Glucose, capillary     Status: Abnormal   Collection Time: 06/26/15  9:37 PM  Result Value Ref Range   Glucose-Capillary 179 (H) 65 - 99 mg/dL  CBC     Status: Abnormal   Collection Time: 06/27/15  2:19 AM  Result Value Ref Range   WBC 6.6 4.0 - 10.5 K/uL   RBC 2.79 (L) 4.22 - 5.81 MIL/uL   Hemoglobin 8.1 (L) 13.0 - 17.0 g/dL   HCT 25.7 (L) 39.0 - 52.0 %   MCV 92.1 78.0 - 100.0 fL   MCH 29.0 26.0 - 34.0 pg   MCHC 31.5 30.0 - 36.0  g/dL   RDW 13.8 11.5 - 15.5 %   Platelets 168 150 - 400 K/uL  Basic metabolic panel     Status: Abnormal   Collection Time: 06/27/15  2:19 AM  Result Value Ref Range   Sodium 126 (L) 135 - 145 mmol/L   Potassium 3.2 (L) 3.5 - 5.1 mmol/L   Chloride 90 (L) 101 - 111 mmol/L   CO2 23 22 - 32 mmol/L   Glucose, Bld 528 (H) 65 - 99 mg/dL   BUN 33 (H) 6 - 20 mg/dL   Creatinine, Ser 7.91 (H) 0.61 - 1.24 mg/dL   Calcium 7.0 (L) 8.9 - 10.3 mg/dL   GFR calc non Af Amer 7 (L) >60 mL/min   GFR calc Af Amer 8 (L) >60 mL/min   Anion gap 13 5 - 15     Studies/Results: No results found.  . calcitRIOL  1 mcg Per Tube Q M,W,F-HD  . calcium acetate  1,334 mg Oral TID WC  . cinacalcet  30 mg Oral Q supper  . diatrizoate meglumine-sodium  90 mL Oral Once  . gi cocktail  30 mL Oral Once  . heparin  5,000 Units Subcutaneous Q8H  . insulin aspart  0-9 Units Subcutaneous TID WC  . isosorbide mononitrate  60 mg Oral Daily  . LORazepam  0.5 mg Oral Once  . multivitamin  1 tablet Oral QHS     Assessment/Plan: s/p Procedure(s): HERNIA REPAIR UMBILICAL ADULT  Incisional umbilical hernia with small bowel  obstruction Umbilical hernia repair [id number], Dr. Georganna Skeans Symptomatic cholecystitis with laparoscopic cholecystectomy, 06/17/15, Dr. Ninfa Linden Cough.  Defer evaluation and management to family medicine. ESRD on HD MWF Cirrhosis/Hepatitis C Hx of CABG/ICD/CM FEN: Advance to soft diet ID: Pre op only DVT: Heparin  Possible discharge tomorrow from surgical standpoint if he makes adequate progress.  @PROBHOSP @  LOS: 12 days    Eric [name] M 06/27/2015  . .prob

## 2015-06-27 NOTE — Progress Notes (Signed)
   [facility] Progress Note   Subjective: didn't tol solids , on soft diet now. +BM's now.  No SOB, +cough using IS  Filed Vitals:   06/26/15 0900 06/26/15 1416 06/26/15 2109 06/27/15 0525  BP: 109/67 114/66 144/82 135/78  Pulse:  84 88   Temp:  98.6 F (37 C) 98.5 F (36.9 C) 98.4 F (36.9 C)  TempSrc:  Oral Oral Oral  Resp:  14 17 18   Weight:    79.516 kg (175 lb 4.8 oz)  SpO2:  97% 95% 96%    Inpatient medications: . calcitRIOL  1 mcg Per Tube Q M,W,F-HD  . calcium acetate  1,334 mg Oral TID WC  . cinacalcet  30 mg Oral Q supper  . diatrizoate meglumine-sodium  90 mL Oral Once  . gi cocktail  30 mL Oral Once  . heparin  5,000 Units Subcutaneous Q8H  . insulin aspart  0-9 Units Subcutaneous TID WC  . isosorbide mononitrate  60 mg Oral Daily  . LORazepam  0.5 mg Oral Once  . multivitamin  1 tablet Oral QHS  . potassium chloride  40 mEq Oral BID     acetaminophen, cloNIDine, gi cocktail, morphine injection, ondansetron (ZOFRAN) IV, oxyCODONE-acetaminophen  Exam: Alert, no distress No jvd Chest clear bilat RRR no mrg Abd distended, firm, nontender, dec'd BS No LE edema R IJ cath, RUA AVF +bruit Alert nf ox 3  Dialysis: MWF [facility]  4h 49min   81.5kg  2/2.25 bath  RUA AVF (maturing) / R IJ cath Hep 7000 Calc 1ug Last pth 802  Hb 12  44%sat      Assessment: 1 Lap chole 5/25 2 Postop SBO w umb hernia - sp exlap 6/1 3 ESRD HD mwf, ok to use AVF per VVS, pt has been refusing tho 4 HTN bp's good, off meds 5 ^PTH cont med rx 6 Vol under dry, stable 7 Hx CABG/ ICD/ CM  Plan - HD Monday, UF 2.5kg as Ronnie Derby MD [facility] pager [phone]    cell [phone] 06/27/2015, 12:35 PM    Recent Labs Lab 06/21/15 0217 06/22/15 0227 06/23/15 0842  06/25/15 0324 06/26/15 0943 06/27/15 0219  NA 133* 135 140  < > 137 135 126*  K 4.9 3.8 3.6  < > 3.5 4.0 3.2*  CL 93* 92* 88*  < > 91* 97* 90*  CO2 26 29 38*  < > 31 25 23    GLUCOSE 99 69 146*  < > 157* 112* 528*  BUN 31* 17 32*  < > 38* 31* 33*  CREATININE 8.08* 5.66* 8.86*  < > 9.35* 7.77* 7.91*  CALCIUM 8.8* 8.6* 8.6*  < > 8.5* 7.9* 7.0*  PHOS 6.8* 5.1* 6.1*  --   --   --   --   < > = values in this interval not displayed.  Recent Labs Lab 06/22/15 0227 06/23/15 0842 06/24/15 0320  AST  --   --  25  ALT  --   --  12*  ALKPHOS  --   --  56  BILITOT  --   --  1.2  PROT  --   --  9.4*  ALBUMIN 3.1* 3.0* 3.0*    Recent Labs Lab 06/25/15 1700 06/26/15 0943 06/27/15 0219  WBC 4.6 7.8 6.6  HGB 9.5* 10.4* 8.1*  HCT 31.0* 33.0* 25.7*  MCV 93.7 93.8 92.1  PLT 186 192 168

## 2015-06-28 LAB — CBC
HCT: 28.8 % — ABNORMAL LOW (ref 39.0–52.0)
HEMOGLOBIN: 9.2 g/dL — AB (ref 13.0–17.0)
MCH: 28.6 pg (ref 26.0–34.0)
MCHC: 31.9 g/dL (ref 30.0–36.0)
MCV: 89.4 fL (ref 78.0–100.0)
Platelets: 197 10*3/uL (ref 150–400)
RBC: 3.22 MIL/uL — AB (ref 4.22–5.81)
RDW: 13.4 % (ref 11.5–15.5)
WBC: 7.8 10*3/uL (ref 4.0–10.5)

## 2015-06-28 LAB — GLUCOSE, CAPILLARY
GLUCOSE-CAPILLARY: 95 mg/dL (ref 65–99)
GLUCOSE-CAPILLARY: 93 mg/dL (ref 65–99)
Glucose-Capillary: 142 mg/dL — ABNORMAL HIGH (ref 65–99)

## 2015-06-28 LAB — BASIC METABOLIC PANEL
ANION GAP: 11 (ref 5–15)
BUN: 44 mg/dL — ABNORMAL HIGH (ref 6–20)
CALCIUM: 7.7 mg/dL — AB (ref 8.9–10.3)
CO2: 26 mmol/L (ref 22–32)
Chloride: 92 mmol/L — ABNORMAL LOW (ref 101–111)
Creatinine, Ser: 9.67 mg/dL — ABNORMAL HIGH (ref 0.61–1.24)
GFR, EST AFRICAN AMERICAN: 6 mL/min — AB (ref >60)
GFR, EST NON AFRICAN AMERICAN: 5 mL/min — AB (ref >60)
GLUCOSE: 101 mg/dL — AB (ref 65–99)
Potassium: 4.6 mmol/L (ref 3.5–5.1)
SODIUM: 129 mmol/L — AB (ref 135–145)

## 2015-06-28 MED ORDER — TAMSULOSIN HCL 0.4 MG PO CAPS
0.4000 mg | ORAL_CAPSULE | Freq: Every day | ORAL | Status: DC
  Administered 2015-06-28 – 2015-06-30 (×3): 0.4 mg via ORAL
  Filled 2015-06-28 (×3): qty 1

## 2015-06-28 MED ORDER — POLYETHYLENE GLYCOL 3350 17 G PO PACK
17.0000 g | PACK | Freq: Two times a day (BID) | ORAL | Status: DC
  Administered 2015-06-28 – 2015-07-01 (×6): 17 g via ORAL
  Filled 2015-06-28 (×7): qty 1

## 2015-06-28 MED ORDER — CARVEDILOL 3.125 MG PO TABS
3.1250 mg | ORAL_TABLET | Freq: Two times a day (BID) | ORAL | Status: DC
  Administered 2015-06-28: 3.125 mg via ORAL
  Filled 2015-06-28: qty 1

## 2015-06-28 MED ORDER — DOCUSATE SODIUM 100 MG PO CAPS: 100.0000 mg | ORAL_CAPSULE | Freq: Two times a day (BID) | ORAL | Status: DC

## 2015-06-28 MED ORDER — CARVEDILOL 6.25 MG PO TABS: 6.2500 mg | ORAL_TABLET | Freq: Two times a day (BID) | ORAL | Status: DC

## 2015-06-28 MED ORDER — FENTANYL CITRATE (PF) 100 MCG/2ML IJ SOLN
25.0000 ug | INTRAMUSCULAR | Status: DC | PRN
  Administered 2015-06-29 – 2015-06-30 (×5): 25 ug via INTRAVENOUS
  Filled 2015-06-28 (×4): qty 2

## 2015-06-28 NOTE — Progress Notes (Signed)
PT Cancellation Note  Patient Details Name: Eric [name] MRN: [id number] DOB: 02/19/1955   Cancelled Treatment:    Reason Eval/Treat Not Completed: Patient at procedure or test/unavailable (pt in HD)   [facility] 06/28/2015, 8:55 AM [facility] PT [phone]

## 2015-06-28 NOTE — Progress Notes (Signed)
4 Days Post-Op  Subjective: Pt had BM yesterday Not making urine   Objective: Vital signs in last 24 hours: Temp:  [97.5 F (36.4 C)-98.3 F (36.8 C)] 98.3 F (36.8 C) (06/05 0536) Pulse Rate:  [85] 85 (06/05 0536) Resp:  [16-18] 16 (06/05 0536) BP: (123-156)/(68-89) 135/87 mmHg (06/05 0536) SpO2:  [95 %-99 %] 95 % (06/05 0536) Last BM Date: 06/27/15  Intake/Output from previous day: 06/04 0701 - 06/05 0700 In: -  Out: 250 [Urine:250] Intake/Output this shift:    Incision/Wound:incisions clean  Some distention noted soft   Lab Results:   Recent Labs  06/27/15 0219 06/28/15 0620  WBC 6.6 7.8  HGB 8.1* 9.2*  HCT 25.7* 28.8*  PLT 168 197   BMET  Recent Labs  06/27/15 0219 06/28/15 0620  NA 126* 129*  K 3.2* 4.6  CL 90* 92*  CO2 23 26  GLUCOSE 528* 101*  BUN 33* 44*  CREATININE 7.91* 9.67*  CALCIUM 7.0* 7.7*   PT/INR No results for input(s): LABPROT, INR in the last 72 hours. ABG No results for input(s): PHART, HCO3 in the last 72 hours.  Invalid input(s): PCO2, PO2  Studies/Results: No results found.  Anti-infectives: Anti-infectives    Start     Dose/Rate Route Frequency Ordered Stop   06/24/15 1845  ceFAZolin (ANCEF) powder 1 g     1 g Other To Surgery 06/24/15 1841 06/24/15 1945   06/17/15 1500  ceFAZolin (ANCEF) IVPB 2g/100 mL premix  Status:  Discontinued    Comments:  Pharmacy may adjust dosing strength, interval, or rate of medication as needed for optimal therapy for the patient  Send with patient on call to the OR.  Anesthesia to complete antibiotic administration <35min prior to incision per [facility].   2 g 200 mL/hr over 30 Minutes Intravenous On call to O.R. 06/17/15 1019 06/18/15 0837      Assessment/Plan: s/p Procedure(s): HERNIA REPAIR UMBILICAL ADULT (N/A) Add miralax Nurses wondering about no urine output? On HD would ask nephrology to address Pt has poor understanding of medical problems and HD  Can go home when  medically able Would send home on soft diet   LOS: 13 days    August Gosser A. 06/28/2015

## 2015-06-28 NOTE — Progress Notes (Signed)
Utilization review completed.  

## 2015-06-28 NOTE — Progress Notes (Signed)
Family Medicine Teaching Service Daily Progress Note Intern Pager: [phone]  Patient name: Cabel Jaskot Medical record number: [id number] Date of birth: 11/01/1955 Age: 60 y.o. Gender: male  Primary Care Provider: [name], NP Consultants: surgery  Code Status: FULL  Pt Overview and Major Events to Date:  5/22: admit to [facility] 5/25: cholecystectomy  5/29: NG tube placed due to post-op ileus 6/1: Repeat CT: high gradeSBO due to small bowel in umbilical hernia-> to OR for repair incisional umbilical hernia 6/3: NGT out  Assessment and Plan: Hendryx Snidow is a 60 y.o. male presenting with chest pain/HTN found to have acute cholecystitis s/p cholecystectomy (5/25) and found to have post op ileus due to incisional hernia s/p repair (6/1). PMH is significant for HTN, HLD, Cardiac arrest, HFrEF, AICD, MI (2015), ESRD, Type 2 DM, CABG (1999), Hepatitis C (cirrhosis)  S/p cholecystectomy (POD7), with post-op ileus due to small bowel umbilical hernia s/p repair (POD4), improved:  - Continue soft diet  - Surgery following: add miralax; when discharge, send home on soft diet - Zofran for nausea  - avoiding narcotics: discontinued PRN morphine; discontinued PRN Percocet;   Tylenol PRN available  - add Miralax 17g BID - Ambulate 6 times daily per instructions  - PT/OT consulted  ESRD: Nephrology following. M/W/F dialysis - planned dialysis tomorrow ( Monday 6/5) - AV fistula analyzed by vascular, good flow. - Renal panel daily  HFrEF: EF 45-50%, moderate concentric hypertrophy, Systolic pressure was mildly to moderately increased. PA peak pressure: 47 mm Hg (S). Volume well controlled with dialysis - MWF Dialysis for volume control - cont Imdur; restart home coreg (lower dose) - Holding home hydralazine   HTN: overall stable - holding hydralazine, and norvasc  - continue imdur - restart Coreg (at lower dose)  - will monitor closely; consider restarting held medications  if blood pressures elevated  T2DM. Most recent A1c 5.9 (02/2015). Holding Glipizide.CBGs 109-176 - CBGs ACHS - sensitive sliding scale  Urinary Retention:  - bladder scan q 4 hrs; I/O cath if > 200cc  Hypokalemia, resolved.  - Will follow with labs  Normocytic Anemia: hgb 8.1 6/4. 9.2 today. Likely lab error.   FEN/GI: soft diet, sliv Prophylaxis: heparin SBQ  Disposition: pending improvement of ileus and urinary retention.    Subjective:  Reports his abdomen feels better this morning. Tolerating soft diet. Passing gas but no BM "since he had the second surgery". Reports he is not voiding on his own since his foley was removed. Per chart bladder scan showed 308 on 6/4 at 1930 with I/O cath 250.   Objective: Temp:  [97.5 F (36.4 C)-98.3 F (36.8 C)] 98.3 F (36.8 C) (06/05 0536) Pulse Rate:  [85] 85 (06/05 0536) Resp:  [16-18] 16 (06/05 0536) BP: (123-156)/(68-89) 135/87 mmHg (06/05 0536) SpO2:  [95 %-99 %] 95 % (06/05 0536) Physical Exam: General: NAD, in dialysis Cardiovascular: RRR, no m/r/g Respiratory: On room air, CTAB, no increased WOB  Abdomen: somewhat distended, but soft,tenderness to palpation on lower abdomen, no guarding or rebound/peritoneal signs. Surgical wounds clean/dry and intact  Extremities: moves all extremities  Laboratory:  Recent Labs Lab 06/25/15 1700 06/26/15 0943 06/27/15 0219  WBC 4.6 7.8 6.6  HGB 9.5* 10.4* 8.1*  HCT 31.0* 33.0* 25.7*  PLT 186 192 168    Recent Labs Lab 06/24/15 0320 06/25/15 0324 06/26/15 0943 06/27/15 0219  NA 137 137 135 126*  K 3.4* 3.5 4.0 3.2*  CL 91* 91* 97* 90*  CO2 32  31 25 23   BUN 19 38* 31* 33*  CREATININE 6.73* 9.35* 7.77* 7.91*  CALCIUM 9.0 8.5* 7.9* 7.0*  PROT 9.4*  --   --   --   BILITOT 1.2  --   --   --   ALKPHOS 56  --   --   --   ALT 12*  --   --   --   AST 25  --   --   --   GLUCOSE 141* 157* 112* 528*    Smiley Houseman, MD 06/28/2015, 6:24 AM PGY-1, [facility] Intern pager: [phone], text pages welcome

## 2015-06-28 NOTE — Procedures (Signed)
I was present at this dialysis session. I have reviewed the session itself and made appropriate changes.   TDC, 2K, 2.25Ca bath. UF Goal 2.5L.    Recent Labs Lab 06/23/15 0842  06/28/15 0620  NA 140  < > 129*  K 3.6  < > 4.6  CL 88*  < > 92*  CO2 38*  < > 26  GLUCOSE 146*  < > 101*  BUN 32*  < > 44*  CREATININE 8.86*  < > 9.67*  CALCIUM 8.6*  < > 7.7*  PHOS 6.1*  --   --   < > = values in this interval not displayed.   Recent Labs Lab 06/26/15 0943 06/27/15 0219 06/28/15 0620  WBC 7.8 6.6 7.8  HGB 10.4* 8.1* 9.2*  HCT 33.0* 25.7* 28.8*  MCV 93.8 92.1 89.4  PLT 192 168 197    Scheduled Meds: . calcitRIOL  1 mcg Per Tube Q M,W,F-HD  . calcium acetate  1,334 mg Oral TID WC  . carvedilol  3.125 mg Oral BID WC  . cinacalcet  30 mg Oral Q supper  . diatrizoate meglumine-sodium  90 mL Oral Once  . gi cocktail  30 mL Oral Once  . heparin  5,000 Units Subcutaneous Q8H  . insulin aspart  0-9 Units Subcutaneous TID WC  . isosorbide mononitrate  60 mg Oral Daily  . LORazepam  0.5 mg Oral Once  . multivitamin  1 tablet Oral QHS  . polyethylene glycol  17 g Oral BID   Continuous Infusions:  PRN Meds:.acetaminophen, cloNIDine, gi cocktail, ondansetron (ZOFRAN) IV, oxyCODONE-acetaminophen   Pearson Grippe  MD 06/28/2015, 9:10 AM

## 2015-06-29 ENCOUNTER — Inpatient Hospital Stay (HOSPITAL_COMMUNITY): Payer: Medicaid Other

## 2015-06-29 DIAGNOSIS — K5669 Other intestinal obstruction: Secondary | ICD-10-CM

## 2015-06-29 DIAGNOSIS — K567 Ileus, unspecified: Secondary | ICD-10-CM

## 2015-06-29 DIAGNOSIS — T85598D Other mechanical complication of other gastrointestinal prosthetic devices, implants and grafts, subsequent encounter: Secondary | ICD-10-CM

## 2015-06-29 DIAGNOSIS — Z4659 Encounter for fitting and adjustment of other gastrointestinal appliance and device: Secondary | ICD-10-CM

## 2015-06-29 LAB — BASIC METABOLIC PANEL
ANION GAP: 10 (ref 5–15)
BUN: 21 mg/dL — AB (ref 6–20)
CALCIUM: 7.5 mg/dL — AB (ref 8.9–10.3)
CO2: 29 mmol/L (ref 22–32)
Chloride: 91 mmol/L — ABNORMAL LOW (ref 101–111)
Creatinine, Ser: 5.53 mg/dL — ABNORMAL HIGH (ref 0.61–1.24)
GFR calc Af Amer: 12 mL/min — ABNORMAL LOW (ref >60)
GFR, EST NON AFRICAN AMERICAN: 10 mL/min — AB (ref >60)
GLUCOSE: 147 mg/dL — AB (ref 65–99)
Potassium: 4.2 mmol/L (ref 3.5–5.1)
Sodium: 130 mmol/L — ABNORMAL LOW (ref 135–145)

## 2015-06-29 LAB — GLUCOSE, CAPILLARY
GLUCOSE-CAPILLARY: 162 mg/dL — AB (ref 65–99)
GLUCOSE-CAPILLARY: 85 mg/dL (ref 65–99)
Glucose-Capillary: 113 mg/dL — ABNORMAL HIGH (ref 65–99)
Glucose-Capillary: 122 mg/dL — ABNORMAL HIGH (ref 65–99)

## 2015-06-29 LAB — CBC
HEMATOCRIT: 27.4 % — AB (ref 39.0–52.0)
Hemoglobin: 8.7 g/dL — ABNORMAL LOW (ref 13.0–17.0)
MCH: 28.5 pg (ref 26.0–34.0)
MCHC: 31.8 g/dL (ref 30.0–36.0)
MCV: 89.8 fL (ref 78.0–100.0)
PLATELETS: 189 10*3/uL (ref 150–400)
RBC: 3.05 MIL/uL — ABNORMAL LOW (ref 4.22–5.81)
RDW: 13.7 % (ref 11.5–15.5)
WBC: 6.9 10*3/uL (ref 4.0–10.5)

## 2015-06-29 MED ORDER — CARVEDILOL 6.25 MG PO TABS
6.2500 mg | ORAL_TABLET | Freq: Two times a day (BID) | ORAL | Status: DC
  Administered 2015-06-29 – 2015-06-30 (×2): 6.25 mg via ORAL
  Filled 2015-06-29 (×2): qty 1

## 2015-06-29 MED ORDER — SENNA 8.6 MG PO TABS
1.0000 | ORAL_TABLET | Freq: Every day | ORAL | Status: DC
  Administered 2015-06-29 – 2015-06-30 (×2): 8.6 mg via ORAL
  Filled 2015-06-29 (×2): qty 1

## 2015-06-29 MED ORDER — BISACODYL 10 MG RE SUPP: 10.0000 mg | Freq: Once | RECTAL | Status: DC

## 2015-06-29 MED ORDER — MAGNESIUM HYDROXIDE 400 MG/5ML PO SUSP
30.0000 mL | Freq: Every day | ORAL | Status: DC | PRN
  Administered 2015-06-29: 30 mL via ORAL
  Filled 2015-06-29: qty 30

## 2015-06-29 MED ORDER — CARVEDILOL 3.125 MG PO TABS
3.1250 mg | ORAL_TABLET | Freq: Two times a day (BID) | ORAL | Status: DC
  Administered 2015-06-29: 3.125 mg via ORAL
  Filled 2015-06-29: qty 1

## 2015-06-29 MED ORDER — BISACODYL 10 MG RE SUPP
10.0000 mg | Freq: Every day | RECTAL | Status: DC | PRN
  Administered 2015-06-29: 10 mg via RECTAL
  Filled 2015-06-29: qty 1

## 2015-06-29 NOTE — Progress Notes (Signed)
Family Medicine Teaching Service Daily Progress Note Intern Pager: [phone]  Patient name: Eric [name] Medical record number: [id number] Date of birth: Dec 07, 1955 Age: 60 y.o. Gender: male  Primary Care Provider: [name], NP Consultants: surgery  Code Status: FULL  Pt Overview and Major Events to Date:  5/22: admit to FPTS 5/25: cholecystectomy  5/29: NG tube placed due to post-op ileus 6/1: Repeat CT: high gradeSBO due to small bowel in umbilical hernia-> to OR for repair incisional umbilical hernia 6/3: NGT out  Assessment and Plan: Eric [name] is a 60 y.o. male presenting with chest pain/HTN found to have acute cholecystitis s/p cholecystectomy (5/25) and found to have post op ileus due to incisional hernia s/p repair (6/1). PMH is significant for HTN, HLD, Cardiac arrest, HFrEF, AICD, MI (2015), ESRD, Type 2 DM, CABG (1999), Hepatitis C (cirrhosis)  GI: S/p cholecystectomy (5/25), with post-op ileus due to small bowel umbilical hernia s/p repair (6/1), improved:  - Continue soft diet  - Surgery following: updated surgery on serous drainage from incision site; will see today - Zofran for nausea  - pain control: Fentanyl 79mcg q 3 PRN (x 1 ) and Tylenol PRN  - Miralax 17g BID - Ambulate 6 times daily per instructions  - PT/OT consulted  Renal:   ESRD: Nephrology following. M/W/F dialysis - AV fistula analyzed by vascular, good flow. - Renal panel daily    Urinary Retention: no bladder scans overnight although ordered - bladder scan q 4 hrs; I/O cath if > 200cc - Flomax (started 6/5)  CV:  HFrEF: EF 45-50%, moderate concentric hypertrophy, Systolic pressure was mildly to moderately increased. PA peak pressure: 47 mm Hg (S). Volume well controlled with dialysis - MWF Dialysis for volume control - cont Imdur; coreg (lower dose) - Holding home hydralazine    HTN: elevated yesterday morning and afternoon but self resolved yesterday evening - holding  hydralazine, and norvasc  - continue imdur and coreg (lower dose) - Coreg  - will monitor closely; consider restarting held medications if blood pressures elevated  Endocrinology:  T2DM. Most recent A1c 5.9 (02/2015). Holding Glipizide.CBGs 95 -147 - CBGs ACHS - sensitive sliding scale  Hypokalemia, resolved.  - Will follow with labs  Normocytic Anemia: hgb stable at 8.7 (from 9.2) (baseline 10-11)  FEN/GI: soft diet, sliv Prophylaxis: heparin SBQ  Disposition: pending improvement of ileus and urinary retention.    Subjective:  Overall abdomen feels better but still needing PRN pain medications which help. Nausea early this morning which self resolved. Tolerating soft diet. No BM "for days". Reports of body pain. Reports serous drainage from most recent incision site.   Objective: Temp:  [97.9 F (36.6 C)-98.5 F (36.9 C)] 98.4 F (36.9 C) (06/05 2020) Pulse Rate:  [80-91] 84 (06/05 2020) Resp:  [16-22] 20 (06/05 2020) BP: (113-183)/(56-100) 118/56 mmHg (06/05 2020) SpO2:  [95 %-98 %] 98 % (06/05 2020) Weight:  [81.6 kg (179 lb 14.3 oz)] 81.6 kg (179 lb 14.3 oz) (06/05 0830) Physical Exam: General: NAD, sitting and eating breakfast  Cardiovascular: RRR, no m/r/g Respiratory: On room air, CTAB, no increased WOB  Abdomen: softer,reports mild tenderness to palpation on lower abdomen, no guarding or rebound/peritoneal signs. Surgical wounds clean/dry and intact except for central incision site near umbilicus which seems to have serous drainage on gauze- site without erythema or increased warmth.  Extremities: moves all extremities  Laboratory:  Recent Labs Lab 06/27/15 0219 06/28/15 0620 06/29/15 0234  WBC 6.6 7.8 6.9  HGB 8.1* 9.2* 8.7*  HCT 25.7* 28.8* 27.4*  PLT 168 197 189    Recent Labs Lab 06/24/15 0320  06/27/15 0219 06/28/15 0620 06/29/15 0234  NA 137  < > 126* 129* 130*  K 3.4*  < > 3.2* 4.6 4.2  CL 91*  < > 90* 92* 91*  CO2 32  < > 23 26 29   BUN  19  < > 33* 44* 21*  CREATININE 6.73*  < > 7.91* 9.67* 5.53*  CALCIUM 9.0  < > 7.0* 7.7* 7.5*  PROT 9.4*  --   --   --   --   BILITOT 1.2  --   --   --   --   ALKPHOS 56  --   --   --   --   ALT 12*  --   --   --   --   AST 25  --   --   --   --   GLUCOSE 141*  < > 528* 101* 147*  < > = values in this interval not displayed.  Smiley Houseman, MD 06/29/2015, 6:26 AM PGY-1, [place] Intern pager: [phone], text pages welcome

## 2015-06-29 NOTE — Progress Notes (Signed)
5 Days Post-Op  Subjective: Started draining serous fluid from umbilical site last Pm, He has not had a BM since the day after his umbilical repair. Very little flatus again.    Objective: Vital signs in last 24 hours: Temp:  [98.4 F (36.9 C)-98.5 F (36.9 C)] 98.4 F (36.9 C) (06/05 2020) Pulse Rate:  [80-88] 84 (06/05 2020) Resp:  [17-22] 20 (06/05 2020) BP: (113-183)/(56-100) 118/56 mmHg (06/05 2020) SpO2:  [98 %] 98 % (06/05 2020) Last BM Date: 06/27/15 600 PO 4 BM recorded June 1-2 Afebrile, VSS NA 130, creatinine 5.53 CBC stable Intake/Output from previous day: 06/05 0701 - 06/06 0700 In: 600 [P.O.:600] Out: -  Intake/Output this shift:    General appearance: alert, cooperative and no distress GI: distended, up in chair, few BS, serous drainage from the umbilical site.    Lab Results:   Recent Labs  06/28/15 0620 06/29/15 0234  WBC 7.8 6.9  HGB 9.2* 8.7*  HCT 28.8* 27.4*  PLT 197 189    BMET  Recent Labs  06/28/15 0620 06/29/15 0234  NA 129* 130*  K 4.6 4.2  CL 92* 91*  CO2 26 29  GLUCOSE 101* 147*  BUN 44* 21*  CREATININE 9.67* 5.53*  CALCIUM 7.7* 7.5*   PT/INR No results for input(s): LABPROT, INR in the last 72 hours.   Recent Labs Lab 06/23/15 0842 06/24/15 0320  AST  --  25  ALT  --  12*  ALKPHOS  --  56  BILITOT  --  1.2  PROT  --  9.4*  ALBUMIN 3.0* 3.0*     Lipase     Component Value Date/Time   LIPASE 18 06/24/2015 0320     Studies/Results: No results found.  Medications: . bisacodyl  10 mg Rectal Once  . calcitRIOL  1 mcg Per Tube Q M,W,F-HD  . calcium acetate  1,334 mg Oral TID WC  . carvedilol  3.125 mg Oral BID WC  . cinacalcet  30 mg Oral Q supper  . diatrizoate meglumine-sodium  90 mL Oral Once  . gi cocktail  30 mL Oral Once  . heparin  5,000 Units Subcutaneous Q8H  . insulin aspart  0-9 Units Subcutaneous TID WC  . isosorbide mononitrate  60 mg Oral Daily  . LORazepam  0.5 mg Oral Once  .  multivitamin  1 tablet Oral QHS  . polyethylene glycol  17 g Oral BID  . tamsulosin  0.4 mg Oral Daily    Assessment/Plan Incisional umbilical hernia with small bowel obstruction Umbilical hernia repair [id number], Dr. Georganna Skeans Symptomatic cholecystitis with laparoscopic cholecystectomy, 06/17/15, Dr. Ninfa Linden ESRD on HD MWF Cirrhosis/Hepatitis C Hx of CABG/ICD/CM FEN: soft since 06/27/15 ID: Pre op only DVT: Heparin    Plan:  Go back to clears, check film, TS is going to give a suppository .  Dressing changes for the umbilical site. Film shows:  Findings suggest resolving small bowel obstruction with only mild relative small bowel distention and gas into the rectum.   Will try suppository, MOM, and see how he does, full liquids.      LOS: 14 days    Cariann Kinnamon 06/29/[id number]

## 2015-06-29 NOTE — Progress Notes (Signed)
   06/29/15 0250  Pain Assessment  Pain Assessment 0-10  Pain Score 10  Faces Pain Scale 8  Pain Type Acute pain  Pain Location Generalized  Pain Orientation Mid  Pain Descriptors / Indicators Numbness;Throbbing;Tingling;Pins and needles  Pain Frequency Intermittent  Pain Onset Sudden  Patients Stated Pain Goal 0  Pain Intervention(s) Medication (See eMAR)   Pt. Abdominal incision have small clear drainage oozing out from it. Site clean with normal saline, and covered with gauze and pink foam.

## 2015-06-29 NOTE — Anesthesia Postprocedure Evaluation (Signed)
Anesthesia Post Note  Patient: Eric [name]  Procedure(s) Performed: Procedure(s) (LRB): HERNIA REPAIR UMBILICAL ADULT (N/A)  Patient location during evaluation: PACU Anesthesia Type: General Pain management: pain level controlled Vital Signs Assessment: post-procedure vital signs reviewed and stable Respiratory status: spontaneous breathing Cardiovascular status: stable Postop Assessment: no signs of nausea or vomiting Anesthetic complications: no    Last Vitals:  Filed Vitals:   06/29/15 1500 06/29/15 1540  BP: 137/73 168/83  Pulse: 75   Temp: 37.1 C   Resp: 18     Last Pain:  Filed Vitals:   06/29/15 1540  PainSc: 0-No pain                 Afsa Meany

## 2015-06-29 NOTE — Progress Notes (Signed)
  [facility] Progress Note   Subjective:  HD yesterday, tolerated well Eating well, says not having BMs now, +flatulence  Filed Vitals:   06/28/15 1238 06/28/15 1440 06/28/15 1814 06/28/15 2020  BP: 174/71 141/72 113/66 118/56  Pulse: 86 85 88 84  Temp:  98.5 F (36.9 C)  98.4 F (36.9 C)  TempSrc:  Oral  Oral  Resp: 18 17  20   Height:      Weight:      SpO2:  98%  98%    Inpatient medications: . calcitRIOL  1 mcg Per Tube Q M,W,F-HD  . calcium acetate  1,334 mg Oral TID WC  . carvedilol  3.125 mg Oral BID WC  . cinacalcet  30 mg Oral Q supper  . diatrizoate meglumine-sodium  90 mL Oral Once  . gi cocktail  30 mL Oral Once  . heparin  5,000 Units Subcutaneous Q8H  . insulin aspart  0-9 Units Subcutaneous TID WC  . isosorbide mononitrate  60 mg Oral Daily  . LORazepam  0.5 mg Oral Once  . multivitamin  1 tablet Oral QHS  . polyethylene glycol  17 g Oral BID  . tamsulosin  0.4 mg Oral Daily     acetaminophen, cloNIDine, fentaNYL (SUBLIMAZE) injection, gi cocktail, ondansetron (ZOFRAN) IV  Exam: Alert, no distress, standing up No jvd Chest clear bilat RRR no mrg Abd distended,  nontender No LE edema R IJ cath, RUA AVF +bruit Alert nf ox 3  Dialysis: MWF [facility]  4h 52min   81.5kg  2/2.25 bath  RUA AVF (maturing) / R IJ cath Hep 7000 Calc 1ug Last pth 802  Hb 12  44%sat      Assessment: 1 Lap chole 5/25 2 Postop SBO w umb hernia - sp exlap 6/1 3 ESRD HD mwf, ok to use AVF per [facility], pt has been refusing tho 4 HTN bp's good,  5 ^PTH cont med rx 6 Vol under dry, stable 7 Hx CABG/ ICD/ CM  Plan - HD Tomorrow if here, Tight heparin, cont to probe EDW.  Stop GI Cocktail, use not recommended in ESRD, can use Tums or H2B.    Pearson Grippe MD [facility] 06/29/2015, 9:24 AM    Recent Labs Lab 06/23/15 [phone]  06/27/15 0219 06/28/15 0620 06/29/15 0234  NA 140  < > 126* 129* 130*  K 3.6  < > 3.2* 4.6 4.2  CL 88*  < > 90* 92* 91*   CO2 38*  < > 23 26 29   GLUCOSE 146*  < > 528* 101* 147*  BUN 32*  < > 33* 44* 21*  CREATININE 8.86*  < > 7.91* 9.67* 5.53*  CALCIUM 8.6*  < > 7.0* 7.7* 7.5*  PHOS 6.1*  --   --   --   --   < > = values in this interval not displayed.  Recent Labs Lab 06/23/15 0842 06/24/15 0320  AST  --  25  ALT  --  12*  ALKPHOS  --  56  BILITOT  --  1.2  PROT  --  9.4*  ALBUMIN 3.0* 3.0*    Recent Labs Lab 06/27/15 0219 06/28/15 0620 06/29/15 0234  WBC 6.6 7.8 6.9  HGB 8.1* 9.2* 8.7*  HCT 25.7* 28.8* 27.4*  MCV 92.1 89.4 89.8  PLT 168 197 189

## 2015-06-29 NOTE — Progress Notes (Signed)
Pt with increased bowel sounds after suppository.  Pt with increased flatus and two very small amounts of stool.  Will cont to monitor.

## 2015-06-29 NOTE — Progress Notes (Signed)
Physical Therapy Treatment and D/C Patient Details Name: Eric [name] MRN: [id number] DOB: 1955-10-25 Today's Date: 06/29/2015    History of Present Illness Pt admitted with CP and symptomatic cholelithiasis. s/p lap chole 5/25. Pt now with post op ileus and has NG tube 5/29.  6/1: Repeat CT: high gradeSBO due to small bowel in umbilical hernia-> to OR for repair incisional umbilical hernia.  PMH: ESRD, metabolic bone disease,  CABG, ICD, DM, Hep C, liver cirrhosis, seizure.    PT Comments    Pt admitted with above diagnosis. Pt currently without  functional limitations and is ambulating with RW with Modif I.  Pt wife feels she can assist him without difficulty at home.  Will not need f/u therapy.  Has RW and plans to use it.  All education complete and goals met.  D/C PT.    Follow Up Recommendations  No PT follow up;Supervision for mobility/OOB     Equipment Recommendations  None recommended by PT (Pt has rolling walker)    Recommendations for Other Services       Precautions / Restrictions Precautions Precautions: Fall Restrictions Weight Bearing Restrictions: No    Mobility  Bed Mobility               General bed mobility comments: walking in hallway with wife on arrival.   Transfers Overall transfer level: Modified independent                  Ambulation/Gait Ambulation/Gait assistance: Modified independent (Device/Increase time) Ambulation Distance (Feet): 500 Feet Assistive device: Rolling walker (2 wheeled) Gait Pattern/deviations: Step-through pattern   Gait velocity interpretation: <1.8 ft/sec, indicative of risk for recurrent falls General Gait Details: No loss of balance with walker with challenges to balance.    Stairs Stairs: Yes Stairs assistance: Modified independent (Device/Increase time) Stair Management: Step to pattern;Forwards (with wife) Number of Stairs: 4 General stair comments: Pt and wife return demonstration that they can  get up and down steps at home safely.    Wheelchair Mobility    Modified Rankin (Stroke Patients Only)       Balance Overall balance assessment: Needs assistance Sitting-balance support: No upper extremity supported;Feet supported Sitting balance-Leahy Scale: Good     Standing balance support: No upper extremity supported;During functional activity Standing balance-Leahy Scale: Fair Standing balance comment: can stand statically without UE support.                    Cognition Arousal/Alertness: Awake/alert Behavior During Therapy: Flat affect Overall Cognitive Status: Within Functional Limits for tasks assessed                      Exercises General Exercises - Lower Extremity Ankle Circles/Pumps: AROM;Both;10 reps;Seated Long Arc Quad: AROM;Both;10 reps;Seated    General Comments        Pertinent Vitals/Pain Pain Assessment: No/denies pain  VSS    Home Living                      Prior Function            PT Goals (current goals can now be found in the care plan section) Acute Rehab PT Goals PT Goal Formulation: All assessment and education complete, DC therapy Progress towards PT goals: Goals met/education completed, patient discharged from PT    Frequency  Min 3X/week    PT Plan Current plan remains appropriate    Co-evaluation  End of Session Equipment Utilized During Treatment: Gait belt Activity Tolerance: Patient tolerated treatment well Patient left: in chair;with call bell/phone within reach;with family/visitor present     Time: 1798-1025 PT Time Calculation (min) (ACUTE ONLY): 10 min  Charges:  $Gait Training: 8-22 mins                    G CodesDenice Paradise 16-Jul-2015, 11:21 AM  [facility] [phone] (pager)

## 2015-06-30 LAB — BASIC METABOLIC PANEL
Anion gap: 9 (ref 5–15)
BUN: 28 mg/dL — ABNORMAL HIGH (ref 6–20)
CHLORIDE: 89 mmol/L — AB (ref 101–111)
CO2: 27 mmol/L (ref 22–32)
Calcium: 7.4 mg/dL — ABNORMAL LOW (ref 8.9–10.3)
Creatinine, Ser: 6.67 mg/dL — ABNORMAL HIGH (ref 0.61–1.24)
GFR calc non Af Amer: 8 mL/min — ABNORMAL LOW (ref >60)
GFR, EST AFRICAN AMERICAN: 9 mL/min — AB (ref >60)
Glucose, Bld: 145 mg/dL — ABNORMAL HIGH (ref 65–99)
POTASSIUM: 4.1 mmol/L (ref 3.5–5.1)
SODIUM: 125 mmol/L — AB (ref 135–145)

## 2015-06-30 LAB — GLUCOSE, CAPILLARY
GLUCOSE-CAPILLARY: 232 mg/dL — AB (ref 65–99)
GLUCOSE-CAPILLARY: 104 mg/dL — AB (ref 65–99)
Glucose-Capillary: 110 mg/dL — ABNORMAL HIGH (ref 65–99)

## 2015-06-30 LAB — CBC
HEMATOCRIT: 25.9 % — AB (ref 39.0–52.0)
HEMOGLOBIN: 8.4 g/dL — AB (ref 13.0–17.0)
MCH: 28.7 pg (ref 26.0–34.0)
MCHC: 32.4 g/dL (ref 30.0–36.0)
MCV: 88.4 fL (ref 78.0–100.0)
Platelets: 176 10*3/uL (ref 150–400)
RBC: 2.93 MIL/uL — AB (ref 4.22–5.81)
RDW: 13.6 % (ref 11.5–15.5)
WBC: 7.4 10*3/uL (ref 4.0–10.5)

## 2015-06-30 MED ORDER — DARBEPOETIN ALFA 100 MCG/0.5ML IJ SOSY
100.0000 ug | PREFILLED_SYRINGE | INTRAMUSCULAR | Status: DC
  Administered 2015-06-30: 100 ug via INTRAVENOUS

## 2015-06-30 MED ORDER — KETOROLAC TROMETHAMINE 15 MG/ML IJ SOLN
15.0000 mg | Freq: Four times a day (QID) | INTRAMUSCULAR | Status: DC | PRN
Start: 2015-06-30 — End: 2015-07-01
  Administered 2015-06-30: 15 mg via INTRAVENOUS
  Filled 2015-06-30 (×2): qty 1

## 2015-06-30 MED ORDER — PSYLLIUM 95 % PO PACK
1.0000 | PACK | Freq: Two times a day (BID) | ORAL | Status: DC
  Administered 2015-06-30 – 2015-07-01 (×3): 1 via ORAL
  Filled 2015-06-30 (×3): qty 1

## 2015-06-30 MED ORDER — FENTANYL CITRATE (PF) 100 MCG/2ML IJ SOLN
INTRAMUSCULAR | Status: AC
  Filled 2015-06-30: qty 2

## 2015-06-30 MED ORDER — CARVEDILOL 6.25 MG PO TABS
6.2500 mg | ORAL_TABLET | Freq: Two times a day (BID) | ORAL | Status: DC
Start: 2015-07-01 — End: 2015-07-01

## 2015-06-30 MED ORDER — DARBEPOETIN ALFA 100 MCG/0.5ML IJ SOSY
PREFILLED_SYRINGE | INTRAMUSCULAR | Status: AC
  Filled 2015-06-30: qty 0.5

## 2015-06-30 MED ORDER — SENNA 8.6 MG PO TABS
1.0000 | ORAL_TABLET | Freq: Two times a day (BID) | ORAL | Status: DC
  Administered 2015-06-30 – 2015-07-01 (×2): 8.6 mg via ORAL
  Filled 2015-06-30 (×2): qty 1

## 2015-06-30 MED ORDER — AMLODIPINE BESYLATE 5 MG PO TABS
5.0000 mg | ORAL_TABLET | Freq: Every day | ORAL | Status: DC
  Administered 2015-06-30: 5 mg via ORAL
  Filled 2015-06-30: qty 1

## 2015-06-30 NOTE — Procedures (Signed)
I was present at this dialysis session. I have reviewed the session itself and made appropriate changes.   Pt with flatus and BMs overnight.  Today he complaions of diffuse pain.  Using [facility].  2K bath.  He is ESRD and I suspect his uOP reflects his chronic renal disease.    Hyponatremia is 2/2 excessive free water intake.  Place on 1.2L fluid restriction.    Hb downtrending, provide Aranesp 100 today and continue weekly.    Recent Labs Lab 06/30/15 0153  NA 125*  K 4.1  CL 89*  CO2 27  GLUCOSE 145*  BUN 28*  CREATININE 6.67*  CALCIUM 7.4*     Recent Labs Lab 06/28/15 0620 06/29/15 0234 06/30/15 0153  WBC 7.8 6.9 7.4  HGB 9.2* 8.7* 8.4*  HCT 28.8* 27.4* 25.9*  MCV 89.4 89.8 88.4  PLT 197 189 176    Scheduled Meds: . amLODipine  5 mg Oral Daily  . calcitRIOL  1 mcg Per Tube Q M,W,F-HD  . calcium acetate  1,334 mg Oral TID WC  . carvedilol  6.25 mg Oral BID WC  . cinacalcet  30 mg Oral Q supper  . diatrizoate meglumine-sodium  90 mL Oral Once  . heparin  5,000 Units Subcutaneous Q8H  . insulin aspart  0-9 Units Subcutaneous TID WC  . isosorbide mononitrate  60 mg Oral Daily  . LORazepam  0.5 mg Oral Once  . multivitamin  1 tablet Oral QHS  . polyethylene glycol  17 g Oral BID  . senna  1 tablet Oral Daily  . tamsulosin  0.4 mg Oral Daily   Continuous Infusions:  PRN Meds:.acetaminophen, bisacodyl, cloNIDine, fentaNYL (SUBLIMAZE) injection, ondansetron (ZOFRAN) IV   Pearson Grippe  MD 06/30/2015, 8:45 AM

## 2015-06-30 NOTE — Progress Notes (Signed)
Family Medicine Teaching Service Daily Progress Note Intern Pager: [phone]  Patient name: Eric [name] Medical record number: [id number] Date of birth: [birth year] Age: 60 y.o. Gender: male  Primary Care Provider: [name], NP Consultants: surgery  Code Status: FULL  Pt Overview and Major Events to Date:  5/22: admit to [facility] 5/25: cholecystectomy  5/29: NG tube placed due to post-op ileus 6/1: Repeat CT: high gradeSBO due to small bowel in umbilical hernia-> to OR for repair incisional umbilical hernia 6/3: NGT out 6/6: x-ray abdomen: resolving SBO with mild relative small bowel distention and gas into the rectum; switched back to full liquid diet (from soft)   Assessment and Plan: Eric [name] is a 60 y.o. male presenting with chest pain/HTN found to have acute cholecystitis s/p cholecystectomy (5/25) and found to have post op ileus due to incisional hernia s/p repair (6/1). PMH is significant for HTN, HLD, Cardiac arrest, HFrEF, AICD, MI (2015), ESRD, Type 2 DM, CABG (1999), Hepatitis C (cirrhosis)  GI: S/p cholecystectomy (5/25), with post-op ileus due to small bowel umbilical hernia s/p repair (6/1), improved:  - currently full liquid diet - Surgery following: transition to renal diet, add metamucil - Zofran PRN for nausea  - pain control: Fentanyl 85mcg q 3 PRN (x 3) and Tylenol PRN  - added IV Toradol 15mg  q6 PRN in hopes to decrease opioid use for pain control - Miralax 17g BID; Senna daily; Dulcolax suppository daily PRN - Ambulate - PT/OT consulted: no PTfollow up   Renal:   ESRD: Nephrology following. M/W/F dialysis - AV fistula analyzed by vascular, good flow. - Renal panel daily    Urinary Retention: bladder scans done overnight; 129 @ 0740 with I/O cath of 350cc. Patient did not feel that his bladder was full and that he needed to urinate that that time.  - spoke with nephrology, who states that if patient is not having discomfort he would not be  concerned. He would not consider this as urinary retention. If patient is having discomfort, or bladder scan >1016ml, then consider I/O cath - bladder scan q 12 hrs; I/O cath if > 1000cc pe nephrology reccs - Flomax (started 6/5)  CV:  HFrEF: EF 45-50%, moderate concentric hypertrophy, Systolic pressure was mildly to moderately increased. PA peak pressure: 47 mm Hg (S). Volume well controlled with dialysis - MWF Dialysis for volume control - cont Imdur; coreg  - Holding home hydralazine    HTN: elevated up to SBP 168 - holding hydralazine - continue imdur and coreg  - restart norvasc today (lower dose)  - Coreg  - will monitor closely; consider restarting held medications if blood pressures elevated  MSK: Body aches unclear in etiology, no fevers to indicate infection. Not related to dialysis per patient - will monitor - pain management as noted above  Endocrinology:  T2DM. Most recent A1c 5.9 (02/2015). Holding Glipizide.CBGs 95 -147 - CBGs ACHS - sensitive sliding scale  Hypokalemia, resolved.  - Will follow with labs  Normocytic Anemia: hgb at 8.4 (from 8.7) (baseline 10-11)  FEN/GI: full liquid, sliv Prophylaxis: heparin SBQ  Disposition: pending improvement of ileus and urinary retention    Subjective:  Had MOM and suppository yesterday evening; per nursing mainly water with two small stools. Abdominal distention has improved today per patient. Abdominal pain overall improved, is passing gas. Reports he has body aches and pain in nostrils since removing his NG tube. Body aches are not particularly on dialysis days; reports is crampy pain that  is constant.   Objective: Temp:  [98 F (36.7 C)-98.8 F (37.1 C)] 98.2 F (36.8 C) (06/07 0533) Pulse Rate:  [68-85] 85 (06/07 0533) Resp:  [18-20] 18 (06/07 0533) BP: (137-168)/(73-83) 166/75 mmHg (06/07 0533) SpO2:  [98 %-100 %] 98 % (06/07 0533) Weight:  [81.375 kg (179 lb 6.4 oz)] 81.375 kg (179 lb 6.4 oz) (06/07  0533) Physical Exam: General: NAD, sitting and eating breakfast  Cardiovascular: RRR, no m/r/g Respiratory: On room air, CTAB, no increased WOB  Abdomen: softer,reports mild tenderness to palpation on lower abdomen, no guarding or rebound/peritoneal signs. Surgical wounds clean/dry and intact except for central incision site near umbilicus which seems to have serous drainage on gauze- site without erythema or increased warmth.  Extremities: moves all extremities  Laboratory:  Recent Labs Lab 06/28/15 0620 06/29/15 0234 06/30/15 0153  WBC 7.8 6.9 7.4  HGB 9.2* 8.7* 8.4*  HCT 28.8* 27.4* 25.9*  PLT 197 189 176    Recent Labs Lab 06/24/15 0320  06/28/15 0620 06/29/15 0234 06/30/15 0153  NA 137  < > 129* 130* 125*  K 3.4*  < > 4.6 4.2 4.1  CL 91*  < > 92* 91* 89*  CO2 32  < > 26 29 27   BUN 19  < > 44* 21* 28*  CREATININE 6.73*  < > 9.67* 5.53* 6.67*  CALCIUM 9.0  < > 7.7* 7.5* 7.4*  PROT 9.4*  --   --   --   --   BILITOT 1.2  --   --   --   --   ALKPHOS 56  --   --   --   --   ALT 12*  --   --   --   --   AST 25  --   --   --   --   GLUCOSE 141*  < > 101* 147* 145*  < > = values in this interval not displayed.  Smiley Houseman, MD 06/30/2015, 6:26 AM PGY-1, [place] Intern pager: [phone], text pages welcome

## 2015-06-30 NOTE — Progress Notes (Signed)
6 Days Post-Op  Subjective: Feels better, hungry and wants some food.  Had 1 BM yesterday.  Still draining clear serous fluid from the Umbilical site.  It looks fine.  No nausea or vomiting.    Objective: Vital signs in last 24 hours: Temp:  [98 F (36.7 C)-98.8 F (37.1 C)] 98.2 F (36.8 C) (06/07 0533) Pulse Rate:  [68-85] 85 (06/07 0533) Resp:  [18-20] 18 (06/07 0533) BP: (137-168)/(73-83) 166/75 mmHg (06/07 0533) SpO2:  [98 %-100 %] 98 % (06/07 0533) Weight:  [81.375 kg (179 lb 6.4 oz)] 81.375 kg (179 lb 6.4 oz) (06/07 0533) Last BM Date: 07-12-2015 BM x 1 recorded 720 PO Afebrile, VSS Na 125 H/H is stable Intake/Output from previous day: July 12, 2022 0701 - 06/07 0700 In: 720 [P.O.:720] Out: -  Intake/Output this shift:    General appearance: alert, cooperative and no distress GI: soft sore, serous drainage from the umbilical site. Bowel sounds and 1 BM yesterday.    Lab Results:   Recent Labs  2015-07-12 0234 06/30/15 0153  WBC 6.9 7.4  HGB 8.7* 8.4*  HCT 27.4* 25.9*  PLT 189 176    BMET  Recent Labs  07/12/15 0234 06/30/15 0153  NA 130* 125*  K 4.2 4.1  CL 91* 89*  CO2 29 27  GLUCOSE 147* 145*  BUN 21* 28*  CREATININE 5.53* 6.67*  CALCIUM 7.5* 7.4*   PT/INR No results for input(s): LABPROT, INR in the last 72 hours.   Recent Labs Lab 06/23/15 0842 06/24/15 0320  AST  --  25  ALT  --  12*  ALKPHOS  --  56  BILITOT  --  1.2  PROT  --  9.4*  ALBUMIN 3.0* 3.0*     Lipase     Component Value Date/Time   LIPASE 18 06/24/2015 0320     Studies/Results: Dg Abd 2 Views  12-Jul-2015  CLINICAL DATA:  Small bowel obstruction, recent abdominal surgery EXAM: ABDOMEN - 2 VIEW COMPARISON:  06/24/2015 FINDINGS: Right upper quadrant cholecystectomy clips. Oral contrast is seen into the decompressed left colon. The entire colon is decompressed. There are mildly dilated loops of small bowel identified. A loop in the right lower quadrant measures 3.1 cm. There is  air into the rectum. IMPRESSION: Findings suggest resolving small bowel obstruction with only mild relative small bowel distention and gas into the rectum Electronically Signed   By: Skipper Cliche M.D.   On: 07/12/2015 11:34    Medications: . amLODipine  5 mg Oral Daily  . calcitRIOL  1 mcg Per Tube Q M,W,F-HD  . calcium acetate  1,334 mg Oral TID WC  . carvedilol  6.25 mg Oral BID WC  . cinacalcet  30 mg Oral Q supper  . diatrizoate meglumine-sodium  90 mL Oral Once  . heparin  5,000 Units Subcutaneous Q8H  . insulin aspart  0-9 Units Subcutaneous TID WC  . isosorbide mononitrate  60 mg Oral Daily  . LORazepam  0.5 mg Oral Once  . multivitamin  1 tablet Oral QHS  . polyethylene glycol  17 g Oral BID  . senna  1 tablet Oral Daily  . tamsulosin  0.4 mg Oral Daily    Assessment/Plan Incisional umbilical hernia with small bowel obstruction Umbilical hernia repair [id number], Dr. Georganna Skeans Symptomatic cholecystitis with laparoscopic cholecystectomy, 06/17/15, Dr. Ninfa Linden ESRD on HD MWF Cirrhosis/Hepatitis C Hx of CABG/ICD/CM FEN: soft since 06/27/15 ID: Pre op only DVT: Heparin    Plan  Go back  to a Renal diet and add metamucil.     LOS: 15 days    Eric [name] 06/30/2015 [phone]

## 2015-07-01 ENCOUNTER — Encounter: Payer: Medicaid Other | Admitting: Vascular Surgery

## 2015-07-01 ENCOUNTER — Ambulatory Visit (HOSPITAL_COMMUNITY): Payer: Medicaid Other

## 2015-07-01 LAB — GLUCOSE, CAPILLARY
GLUCOSE-CAPILLARY: 128 mg/dL — AB (ref 65–99)
Glucose-Capillary: 186 mg/dL — ABNORMAL HIGH (ref 65–99)

## 2015-07-01 MED ORDER — PSYLLIUM 95 % PO PACK: 1.0000 | PACK | Freq: Two times a day (BID) | ORAL | Status: DC

## 2015-07-01 MED ORDER — TAMSULOSIN HCL 0.4 MG PO CAPS: 0.4000 mg | ORAL_CAPSULE | Freq: Every day | ORAL | Status: DC

## 2015-07-01 MED ORDER — SENNA 8.6 MG PO TABS: 1.0000 | ORAL_TABLET | Freq: Two times a day (BID) | ORAL | Status: DC

## 2015-07-01 MED ORDER — POLYETHYLENE GLYCOL 3350 17 G PO PACK: 17.0000 g | PACK | Freq: Two times a day (BID) | ORAL | Status: DC

## 2015-07-01 MED ORDER — CALCITRIOL 1 MCG/ML PO SOLN: 1.0000 ug | ORAL | Status: DC

## 2015-07-01 MED ORDER — ACETAMINOPHEN 325 MG PO TABS: 650.0000 mg | ORAL_TABLET | Freq: Four times a day (QID) | ORAL | Status: AC | PRN

## 2015-07-01 MED ORDER — CLONIDINE HCL 0.1 MG PO TABS: 0.1000 mg | ORAL_TABLET | Freq: Three times a day (TID) | ORAL | Status: DC

## 2015-07-01 MED ORDER — CARVEDILOL 6.25 MG PO TABS: 6.2500 mg | ORAL_TABLET | Freq: Two times a day (BID) | ORAL | Status: DC

## 2015-07-01 MED ORDER — RENA-VITE PO TABS: 1.0000 | ORAL_TABLET | Freq: Every day | ORAL | Status: AC

## 2015-07-01 MED ORDER — AMLODIPINE BESYLATE 10 MG PO TABS
10.0000 mg | ORAL_TABLET | Freq: Every day | ORAL | Status: DC
  Administered 2015-07-01: 10 mg via ORAL
  Filled 2015-07-01: qty 1

## 2015-07-01 NOTE — Progress Notes (Signed)
Family Medicine Teaching Service Daily Progress Note Intern Pager: [phone]  Patient name: Eric [name] Medical record number: [id number] Date of birth: December 22, 1955 Age: 60 y.o. Gender: male  Primary Care Provider: [name], NP Consultants: surgery  Code Status: FULL  Pt Overview and Major Events to Date:  5/22: admit to [facility] 5/25: cholecystectomy  5/29: NG tube placed due to post-op ileus 6/1: Repeat CT: high gradeSBO due to small bowel in umbilical hernia-> to OR for repair incisional umbilical hernia 6/3: NGT out 6/6: x-ray abdomen: resolving SBO with mild relative small bowel distention and gas into the rectum; switched back to full liquid diet (from soft)  6/7: switched to renal diet; Aranesp started by renal  Assessment and Plan: Eric [name] is a 60 y.o. male presenting with chest pain/HTN found to have acute cholecystitis s/p cholecystectomy (5/25) and found to have post op ileus due to incisional hernia s/p repair (6/1). PMH is significant for HTN, HLD, Cardiac arrest, HFrEF, AICD, MI (2015), ESRD, Type 2 DM, CABG (1999), Hepatitis C (cirrhosis)  GI: S/p cholecystectomy (5/25), with post-op ileus due to small bowel umbilical hernia s/p repair (6/1), improved:BMs x 2 over 24 hours not requiring suppositories.  - renal diet - Surgery following: stable for discharge - Zofran PRN for nausea  - pain control: Fentanyl 54mcg q 3 PRN, Tylenol PRN, IV Toradol 15mg  q6 PRN in hopes to decrease opioid use for pain control - Miralax 17g BID; Senna BID, Metamucli BID; Dulcolax suppository daily PRN - Ambulate - PT/OT consulted: no PTfollow up   Renal:   ESRD: Nephrology following. M/W/F dialysis - AV fistula analyzed by vascular, good flow.    Urinary Retention:  Last scan @1800  6/7 with 31cc - spoke with nephrology, who states that if patient is not having discomfort he would not be concerned. He would not consider this as urinary retention. If patient is having  discomfort, or bladder scan >1024ml, then consider I/O cath - bladder scan q 12 hrs; I/O cath if > 1000cc per nephrology reccs - discontinue Flomax (started 6/5)  CV:  HFrEF: EF 45-50%, moderate concentric hypertrophy, Systolic pressure was mildly to moderately increased. PA peak pressure: 47 mm Hg (S). Volume well controlled with dialysis - MWF Dialysis for volume control - cont Imdur; coreg  - Holding home hydralazine    HTN: elevated this AM to SBP 176 - holding hydralazine  - continue imdur and coreg  - Norvasc increased to 10mg  (home dose)  MSK: Body aches unclear in etiology, no fevers to indicate infection. Not related to dialysis per patient - will monitor - pain management as noted above  Endocrinology:  T2DM. Most recent A1c 5.9 (02/2015). Holding Glipizide.CBGs 100-232 - CBGs ACHS - sensitive sliding scale  Hypokalemia, resolved.   Normocytic Anemia: hgb at 8.4 (from 8.7) (baseline 10-11) - Aranesp started by nephrology  FEN/GI: renal, 1.2L fluid restriction, sliv Prophylaxis: heparin SBQ  Disposition: possible discharge today.   Subjective:  Reports abdomen feels much better, no significant abdominal pain. Passing flatus much better today. Had a BM yesterday evening without need for suppository. Denies bladder discomfort. Last bladder scan on 7/7 @ 1854 with 31 ccs.  Reports he has "whole body" aches/heaviness constantly in his extremities. This started since he had his surgery. He has more pain in the evening which he describes as crampy pain. Reports that he received saline during dialysis which helped some with the symptoms.   Objective: Temp:  [97.6 F (36.4 C)-98.9 F (37.2  C)] 97.6 F (36.4 C) (06/08 0600) Pulse Rate:  [79-85] 85 (06/08 0600) Resp:  [17-21] 18 (06/08 0600) BP: (105-191)/(59-104) 176/92 mmHg (06/08 0600) SpO2:  [95 %-99 %] 99 % (06/08 0600) Weight:  [79.4 kg (175 lb 0.7 oz)-81.421 kg (179 lb 8 oz)] 81.421 kg (179 lb 8 oz) (06/08  0600) Physical Exam: General: NAD, sitting  Cardiovascular: RRR, no m/r/g Respiratory: On room air, CTAB, no increased WOB  Abdomen: softer, + BS, no tenderness to palpation, no guarding or rebound/peritoneal signs. Surgical wounds clean/dry and intact except for central incision site near umbilicus which seems to have serous drainage on gauze- site without erythema or increased warmth.  Extremities: moves all extremities Neuro: grossly intact   Laboratory:  Recent Labs Lab 06/28/15 0620 06/29/15 0234 06/30/15 0153  WBC 7.8 6.9 7.4  HGB 9.2* 8.7* 8.4*  HCT 28.8* 27.4* 25.9*  PLT 197 189 176    Recent Labs Lab 06/28/15 0620 06/29/15 0234 06/30/15 0153  NA 129* 130* 125*  K 4.6 4.2 4.1  CL 92* 91* 89*  CO2 26 29 27   BUN 44* 21* 28*  CREATININE 9.67* 5.53* 6.67*  CALCIUM 7.7* 7.5* 7.4*  GLUCOSE 101* 147* 145*    Smiley Houseman, MD 07/01/2015, 6:27 AM PGY-1, [facility] Intern pager: [phone], text pages welcome

## 2015-07-01 NOTE — Progress Notes (Signed)
7 Days Post-Op  Subjective: Pt up in chair, BM x 2 yesterday, sites look fine, serous drainage from the umbilical site is much better.    Objective: Vital signs in last 24 hours: Temp:  [97.6 F (36.4 C)-98.9 F (37.2 C)] 97.6 F (36.4 C) (06/08 0600) Pulse Rate:  [79-85] 85 (06/08 0600) Resp:  [17-21] 18 (06/08 0600) BP: (105-191)/(59-104) 176/92 mmHg (06/08 0600) SpO2:  [95 %-99 %] 99 % (06/08 0600) Weight:  [79.4 kg (175 lb 0.7 oz)-81.421 kg (179 lb 8 oz)] 81.421 kg (179 lb 8 oz) (06/08 0600) Last BM Date: 06/30/15 360 PO BM x 2 recorded Afebrile,VSS No labs today  Intake/Output from previous day: 06/07 0701 - 06/08 0700 In: 360 [P.O.:360] Out: 350 [Urine:350] Intake/Output this shift:    General appearance: alert, cooperative and no distress GI: soft, sore, but no tenderness.  Sites ok, drainage is much less and will just need to resolve on it's own.   Lab Results:   Recent Labs  07/29/2015 0234 06/30/15 0153  WBC 6.9 7.4  HGB 8.7* 8.4*  HCT 27.4* 25.9*  PLT 189 176    BMET  Recent Labs  07-29-15 0234 06/30/15 0153  NA 130* 125*  K 4.2 4.1  CL 91* 89*  CO2 29 27  GLUCOSE 147* 145*  BUN 21* 28*  CREATININE 5.53* 6.67*  CALCIUM 7.5* 7.4*   PT/INR No results for input(s): LABPROT, INR in the last 72 hours.  No results for input(s): AST, ALT, ALKPHOS, BILITOT, PROT, ALBUMIN in the last 168 hours.   Lipase     Component Value Date/Time   LIPASE 18 06/24/2015 0320     Studies/Results: Dg Abd 2 Views  07/29/15  CLINICAL DATA:  Small bowel obstruction, recent abdominal surgery EXAM: ABDOMEN - 2 VIEW COMPARISON:  06/24/2015 FINDINGS: Right upper quadrant cholecystectomy clips. Oral contrast is seen into the decompressed left colon. The entire colon is decompressed. There are mildly dilated loops of small bowel identified. A loop in the right lower quadrant measures 3.1 cm. There is air into the rectum. IMPRESSION: Findings suggest resolving small bowel  obstruction with only mild relative small bowel distention and gas into the rectum Electronically Signed   By: Skipper Cliche M.D.   On: 07-29-15 11:34    Medications: . amLODipine  10 mg Oral Daily  . calcitRIOL  1 mcg Per Tube Q M,W,F-HD  . calcium acetate  1,334 mg Oral TID WC  . carvedilol  6.25 mg Oral BID WC  . cinacalcet  30 mg Oral Q supper  . darbepoetin (ARANESP) injection - DIALYSIS  100 mcg Intravenous Q Wed-HD  . diatrizoate meglumine-sodium  90 mL Oral Once  . heparin  5,000 Units Subcutaneous Q8H  . insulin aspart  0-9 Units Subcutaneous TID WC  . isosorbide mononitrate  60 mg Oral Daily  . LORazepam  0.5 mg Oral Once  . multivitamin  1 tablet Oral QHS  . polyethylene glycol  17 g Oral BID  . psyllium  1 packet Oral BID  . senna  1 tablet Oral BID  . tamsulosin  0.4 mg Oral Daily      ESRD on HD MWF Cirrhosis/Hepatitis C Hx of CABG/ICD/CM Anemia of chronic disease  Assessment/Plan Incisional umbilical hernia with small bowel obstruction   Umbilical hernia repair [id number], Dr. Georganna Skeans - serous drainage from umbilical site is resolving Symptomatic cholecystitis with laparoscopic cholecystectomy, 06/17/15, Dr. Ninfa Linden FEN: Renal diet ID: Pre op only DVT: Heparin Dispo:  From our standpoint he can go home when medically ready. Continue dressing changes over the umbilical site till it quits draining.  Post op follow up with Surgery is listed in the AVS.     LOS: 16 days    Lucian Baswell 07/01/2015 [phone]

## 2015-07-01 NOTE — Care Management Note (Signed)
Case Management Note Marvetta Gibbons RN, BSN Unit 2W-Case Manager [phone]  Patient Details  Name: Eric [name] MRN: [id number] Date of Birth: 01-09-1956  Subjective/Objective:   Pt admitted with chest pain - symptomatic cholelithiaisis, s/p lap chole on 06/17/15                 Action/Plan: PTA pt lived at home, [facility] following pt for community management has seen pt and given list for PCP needs- per pt was going to the [facility] but wants to change to new PCP. Pt has Medicaid- would not qualify for any medication assistance- CM to continue to follow pt for d/c needs  Expected Discharge Date:     07/01/15             Expected Discharge Plan:  Home/Self Care  In-House Referral:     Discharge planning Services  CM Consult  Post Acute Care Choice:    Choice offered to:     DME Arranged:    DME Agency:     HH Arranged:    [place] Agency:     Status of Service:  Completed, signed off  Medicare Important Message Given:    Date Medicare IM Given:    Medicare IM give by:    Date Additional Medicare IM Given:    Additional Medicare Important Message give by:     If discussed at [place] of Stay Meetings, dates discussed:    Additional Comments:  07/01/15- 1245- Marvetta Gibbons RN, BSN- pt for d/c home today- have spoken with pt and wife at bedside- confirmed that pt has RW at home and also shower chair- pt reports that he has Medicaid has assigned PCP on Medicaid card for [facility]- explained to pt that unless he gets that changed through [place] that is where he has to go for PCP services or pay out of pocket if another provider agrees to see him. List provided for Medicaid providers in [facility] - pt also followed by [facility] case management- Pt states he will f/u with DSS for another provider- pt will need to made f/u appointment with [facility] in the mean time- discussed [place] options and list provided for [facility] agencies if  pt decides to go home with catheter (per MD pt has decided not to go home with Catheter- on [facility] services will be needed)- No further CM needs noted. Pt reports that he uses Medicaid for medications and also will have Medicare Part D after July 1.   Dawayne Patricia, RN 07/01/2015, 12:48 PM

## 2015-07-01 NOTE — Discharge Instructions (Signed)
You were admitted to the hospital initially for chest pain and elevated blood pressure. You were found to have inflammation in your gall bladder and had surgery to remove it on 5/25. You had post operative ileus as a complication where your bowel were not moving after the surgery. This was thought to be due to a hernia which was repaired on 6/1. Since then you have been on a bowel regimen to help you have bowel movements. Currently you are on Senna twice a day, Miralax twice a day, Metamucil (fiber supplement) twice a day to help you move your bowels. Continue these medications at home (Miralax, Senna, Metamucil) at home. When your bowel movements become watery/loose, you can cut back on these medications until you have at least one soft stool per day. You have a follow up appointment with the surgeons.   Your blood pressure was elevated so we started you back on Coreg. Please continue to take Imdur and Norvasc. Your primary care provider will follow up about your blood pressure.   You were not able to urinate while you were in the hospital. The kidney doctor believes that this is part of the end stage renal disease. We started you on Flomax to see if this helps. You declined to go home with a foley catheter.  If you have bladder discomfort or pain, please contact your kidney doctor or your primary care doctor immediately.     LAPAROSCOPIC SURGERY: POST OP INSTRUCTIONS  1. DIET: Follow a light bland diet the first 24 hours after arrival home, such as soup, liquids, crackers, etc.  Be sure to include lots of fluids daily.  Avoid fast food or heavy meals as your are more likely to get nauseated.  Eat a low fat the next few days after surgery.   2. Take your usually prescribed home medications unless otherwise directed. 3. PAIN CONTROL: a. Pain is best controlled by a usual combination of three different methods TOGETHER: i. Ice/Heat ii. Over the counter pain medication iii. Prescription pain  medication b. Most patients will experience some swelling and bruising around the incisions.  Ice packs or heating pads (30-60 minutes up to 6 times a day) will help. Use ice for the first few days to help decrease swelling and bruising, then switch to heat to help relax tight/sore spots and speed recovery.  Some people prefer to use ice alone, heat alone, alternating between ice & heat.  Experiment to what works for you.  Swelling and bruising can take several weeks to resolve.   c. It is helpful to take an over-the-counter pain medication regularly for the first few weeks.  Choose one of the following that works best for you: i. Naproxen (Aleve, etc)  Two 220mg  tabs twice a day ii. Ibuprofen (Advil, etc) Three 200mg  tabs four times a day (every meal & bedtime) iii. Acetaminophen (Tylenol, etc) 500-650mg  four times a day (every meal & bedtime) d. A  prescription for pain medication (such as oxycodone, hydrocodone, etc) should be given to you upon discharge.  Take your pain medication as prescribed.  i. If you are having problems/concerns with the prescription medicine (does not control pain, nausea, vomiting, rash, itching, etc), please call us [phone] to see if we need to switch you to a different pain medicine that will work better for you and/or control your side effect better. ii. If you need a refill on your pain medication, please contact your pharmacy.  They will contact our office to request  authorization. Prescriptions will not be filled after 5 pm or on week-ends. 4. Avoid getting constipated.  Between the surgery and the pain medications, it is common to experience some constipation.  Increasing fluid intake and taking a fiber supplement (such as Metamucil, Citrucel, FiberCon, MiraLax, etc) 1-2 times a day regularly will usually help prevent this problem from occurring.  A mild laxative (prune juice, Milk of Magnesia, MiraLax, etc) should be taken according to package directions if there  are no bowel movements after 48 hours.   5. Watch out for diarrhea.  If you have many loose bowel movements, simplify your diet to bland foods & liquids for a few days.  Stop any stool softeners and decrease your fiber supplement.  Switching to mild anti-diarrheal medications (Kayopectate, Pepto Bismol) can help.  If this worsens or does not improve, please call us. 6. Wash / shower every day.  You may shower over the dressings as they are waterproof.  Continue to shower over incision(s) after the dressing is off. 7. Remove your waterproof bandages 5 days after surgery.  You may leave the incision open to air.  You may replace a dressing/Band-Aid to cover the incision for comfort if you wish.  8. ACTIVITIES as tolerated:   a. You may resume regular (light) daily activities beginning the next day--such as daily self-care, walking, climbing stairs--gradually increasing activities as tolerated.  If you can walk 30 minutes without difficulty, it is safe to try more intense activity such as jogging, treadmill, bicycling, low-impact aerobics, swimming, etc. b. Save the most intensive and strenuous activity for last such as sit-ups, heavy lifting, contact sports, etc  Refrain from any heavy lifting or straining until you are off narcotics for pain control.   c. DO NOT PUSH THROUGH PAIN.  Let pain be your guide: If it hurts to do something, don't do it.  Pain is your body warning you to avoid that activity for another week until the pain goes down. d. You may drive when you are no longer taking prescription pain medication, you can comfortably wear a seatbelt, and you can safely maneuver your car and apply brakes. e. Dennis Bast may have sexual intercourse when it is comfortable.  9. FOLLOW UP in our office a. Please call [facility] at [phone] to set up an appointment to see your surgeon in the office for a follow-up appointment approximately 2-3 weeks after your surgery. b. Make sure that you call for this appointment  the day you arrive home to insure a convenient appointment time. 10. IF YOU HAVE DISABILITY OR FAMILY LEAVE FORMS, BRING THEM TO THE OFFICE FOR PROCESSING.  DO NOT GIVE THEM TO YOUR DOCTOR.   WHEN TO CALL us [phone]: 1. Poor pain control 2. Reactions / problems with new medications (rash/itching, nausea, etc)  3. Fever over 101.5 F (38.5 C) 4. Inability to urinate 5. Nausea and/or vomiting 6. Worsening swelling or bruising 7. Continued bleeding from incision. 8. Increased pain, redness, or drainage from the incision   The clinic staff is available to answer your questions during regular business hours (8:30am-5pm).  Please dont hesitate to call and ask to speak to one of our nurses for clinical concerns.   If you have a medical emergency, go to the nearest emergency room or call 911.  A surgeon from [facility] is always on call at the [facility], [place], [place], [place], [place]  [address] ? MAIN: [phone]  [phone] ? TOLL FREE: [phone] ?  FAX [phone] [email or web address]  [facility] _______[facility]  UMBILICAL OR INGUINAL HERNIA REPAIR: POST OP INSTRUCTIONS  Always review your discharge instruction sheet given to you by the facility where your surgery was performed. IF YOU HAVE DISABILITY OR FAMILY LEAVE FORMS, YOU MUST BRING THEM TO THE OFFICE FOR PROCESSING.   DO NOT GIVE THEM TO YOUR DOCTOR.  10. A  prescription for pain medication may be given to you upon discharge.  Take your pain medication as prescribed, if needed.  If narcotic pain medicine is not needed, then you may take acetaminophen (Tylenol) or ibuprofen (Advil) as needed. 11. Take your usually prescribed medications unless otherwise directed. 12. If you need a refill on your pain medication, please contact your pharmacy.  They will contact our office to request authorization. Prescriptions will not be filled after 5 pm  or on week-ends. 13. You should follow a light diet the first 24 hours after arrival home, such as soup and crackers, etc.  Be sure to include lots of fluids daily.  Resume your normal diet the day after surgery. 14. Most patients will experience some swelling and bruising around the umbilicus or in the groin and scrotum.  Ice packs and reclining will help.  Swelling and bruising can take several days to resolve.  15. It is common to experience some constipation if taking pain medication after surgery.  Increasing fluid intake and taking a stool softener (such as Colace) will usually help or prevent this problem from occurring.  A mild laxative (Milk of Magnesia or Miralax) should be taken according to package directions if there are no bowel movements after 48 hours. 16. Unless discharge instructions indicate otherwise, you may remove your bandages 24-48 hours after surgery, and you may shower at that time.  You may have steri-strips (small skin tapes) in place directly over the incision.  These strips should be left on the skin for 7-10 days.  If your surgeon used skin glue on the incision, you may shower in 24 hours.  The glue will flake off over the next 2-3 weeks.  Any sutures or staples will be removed at the office during your follow-up visit. 17. ACTIVITIES:  You may resume regular (light) daily activities beginning the next day--such as daily self-care, walking, climbing stairs--gradually increasing activities as tolerated.  You may have sexual intercourse when it is comfortable.  Refrain from any heavy lifting or straining until approved by your doctor. a. You may drive when you are no longer taking prescription pain medication, you can comfortably wear a seatbelt, and you can safely maneuver your car and apply brakes. b. RETURN TO WORK:  __________________________________________________________ 18. You should see your doctor in the office for a follow-up appointment approximately 2-3 weeks after  your surgery.  Make sure that you call for this appointment within a day or two after you arrive home to insure a convenient appointment time. 19. OTHER INSTRUCTIONS:  __________________________________________________________________________________________________________________________________________________________________________________________  WHEN TO CALL YOUR DOCTOR: 9. Fever over 101.0 10. Inability to urinate 11. Nausea and/or vomiting 12. Extreme swelling or bruising 13. Continued bleeding from incision. 14. Increased pain, redness, or drainage from the incision  The clinic staff is available to answer your questions during regular business hours.  Please dont hesitate to call and ask to speak to one of the nurses for clinical concerns.  If you have a medical emergency, go to the nearest emergency room or call 911.  A surgeon  from [facility] is always on call at the hospital   [address], [place], [place], [place]  [address] ?  [address], [place], [place]   [address] [phone] ? [phone] ? FAX [phone] Web site: [email or web address]

## 2015-07-01 NOTE — Progress Notes (Signed)
  [facility] Progress Note   Subjective:  No new events, +BMs Feels ready to go home, last bladder scan < 1102mL  Filed Vitals:   06/30/15 1304 06/30/15 1443 06/30/15 1938 07/01/15 0600  BP: 158/75 105/59 116/63 176/92  Pulse: 84 79 80 85  Temp:  98.6 F (37 C) 98.9 F (37.2 C) 97.6 F (36.4 C)  TempSrc:  Oral Oral Oral  Resp:  18 18 18   Height:      Weight:    81.421 kg (179 lb 8 oz)  SpO2:  99% 99% 99%    Inpatient medications: . amLODipine  10 mg Oral Daily  . calcitRIOL  1 mcg Per Tube Q M,W,F-HD  . calcium acetate  1,334 mg Oral TID WC  . carvedilol  6.25 mg Oral BID WC  . cinacalcet  30 mg Oral Q supper  . darbepoetin (ARANESP) injection - DIALYSIS  100 mcg Intravenous Q Wed-HD  . diatrizoate meglumine-sodium  90 mL Oral Once  . heparin  5,000 Units Subcutaneous Q8H  . insulin aspart  0-9 Units Subcutaneous TID WC  . isosorbide mononitrate  60 mg Oral Daily  . LORazepam  0.5 mg Oral Once  . multivitamin  1 tablet Oral QHS  . polyethylene glycol  17 g Oral BID  . psyllium  1 packet Oral BID  . senna  1 tablet Oral BID  . tamsulosin  0.4 mg Oral Daily     acetaminophen, bisacodyl, cloNIDine, fentaNYL (SUBLIMAZE) injection, ketorolac, ondansetron (ZOFRAN) IV  Exam: Alert, no distress, standing up No jvd Chest clear bilat RRR no mrg Abd distended,  nontender No LE edema R IJ cath, RUA AVF +bruit Alert nf ox 3  Dialysis: MWF [facility]  4h 21min   81.5kg  2/2.25 bath  RUA AVF (maturing) / R IJ cath Hep 7000 Calc 1ug Last pth 802  Hb 12  44%sat      Assessment: 1 Lap chole 5/25 2 Postop SBO w umb hernia - sp exlap 6/1 3 ESRD HD mwf, ok to use AVF per [facility], pt has been refusing tho 4 HTN bp's ok 5 ^PTH cont med rx 6 Vol under dry, stable 7 Hx CABG/ ICD/ CM  Plan -  HD tomorrow if here, but ok for DC from renal viewpoint Aim for EDW of 78kg next HD 2K bath, std heparin, TDC cont PO fluid restriction  Pearson Grippe MD [facility] 07/01/2015, 9:08 AM    Recent Labs Lab 06/28/15 0620 06/29/15 0234 06/30/15 0153  NA 129* 130* 125*  K 4.6 4.2 4.1  CL 92* 91* 89*  CO2 26 29 27   GLUCOSE 101* 147* 145*  BUN 44* 21* 28*  CREATININE 9.67* 5.53* 6.67*  CALCIUM 7.7* 7.5* 7.4*   No results for input(s): AST, ALT, ALKPHOS, BILITOT, PROT, ALBUMIN in the last 168 hours.  Recent Labs Lab 06/28/15 0620 06/29/15 0234 06/30/15 0153  WBC 7.8 6.9 7.4  HGB 9.2* 8.7* 8.4*  HCT 28.8* 27.4* 25.9*  MCV 89.4 89.8 88.4  PLT 197 189 176

## 2015-07-01 NOTE — Progress Notes (Signed)
07/01/2015 2:56 PM Discharge AVS meds taken today and those due this evening reviewed.  Follow-up appointments and when to call md reviewed.  Encouraged wife to call primary dr as soon as they get home for F/U appt.  States she will.  Also discussed with pt and wife the importance to call MD, either PCP or Kidney if pt begins to feel like his bladder is full and he cannot empty it himself.  Both voiced understanding.   D/C IV and TELE.  Questions and concerns addressed.   D/C home per orders.   Carney Corners

## 2015-10-01 ENCOUNTER — Inpatient Hospital Stay (HOSPITAL_COMMUNITY)
Admission: EM | Admit: 2015-10-01 | Discharge: 2015-10-03 | DRG: 313 | Disposition: A | Discharge status: Home / Self Care | Payer: Medicare Other | Attending: Internal Medicine | Admitting: Internal Medicine

## 2015-10-01 ENCOUNTER — Emergency Department (HOSPITAL_COMMUNITY): Payer: Medicare Other

## 2015-10-01 ENCOUNTER — Encounter (HOSPITAL_COMMUNITY): Payer: Self-pay | Admitting: Emergency Medicine

## 2015-10-01 DIAGNOSIS — Z7984 Long term (current) use of oral hypoglycemic drugs: Secondary | ICD-10-CM | POA: Diagnosis not present

## 2015-10-01 DIAGNOSIS — I10 Essential (primary) hypertension: Secondary | ICD-10-CM | POA: Diagnosis present

## 2015-10-01 DIAGNOSIS — Z95810 Presence of automatic (implantable) cardiac defibrillator: Secondary | ICD-10-CM | POA: Diagnosis not present

## 2015-10-01 DIAGNOSIS — E1140 Type 2 diabetes mellitus with diabetic neuropathy, unspecified: Secondary | ICD-10-CM

## 2015-10-01 DIAGNOSIS — M899 Disorder of bone, unspecified: Secondary | ICD-10-CM | POA: Diagnosis present

## 2015-10-01 DIAGNOSIS — Z87891 Personal history of nicotine dependence: Secondary | ICD-10-CM

## 2015-10-01 DIAGNOSIS — Z992 Dependence on renal dialysis: Secondary | ICD-10-CM | POA: Diagnosis not present

## 2015-10-01 DIAGNOSIS — Z951 Presence of aortocoronary bypass graft: Secondary | ICD-10-CM | POA: Diagnosis not present

## 2015-10-01 DIAGNOSIS — Z7982 Long term (current) use of aspirin: Secondary | ICD-10-CM | POA: Diagnosis not present

## 2015-10-01 DIAGNOSIS — R0602 Shortness of breath: Secondary | ICD-10-CM | POA: Diagnosis not present

## 2015-10-01 DIAGNOSIS — Z79899 Other long term (current) drug therapy: Secondary | ICD-10-CM

## 2015-10-01 DIAGNOSIS — N400 Enlarged prostate without lower urinary tract symptoms: Secondary | ICD-10-CM | POA: Diagnosis present

## 2015-10-01 DIAGNOSIS — R079 Chest pain, unspecified: Secondary | ICD-10-CM | POA: Diagnosis present

## 2015-10-01 DIAGNOSIS — E1122 Type 2 diabetes mellitus with diabetic chronic kidney disease: Secondary | ICD-10-CM | POA: Diagnosis present

## 2015-10-01 DIAGNOSIS — I5022 Chronic systolic (congestive) heart failure: Secondary | ICD-10-CM | POA: Diagnosis present

## 2015-10-01 DIAGNOSIS — I132 Hypertensive heart and chronic kidney disease with heart failure and with stage 5 chronic kidney disease, or end stage renal disease: Secondary | ICD-10-CM | POA: Diagnosis present

## 2015-10-01 DIAGNOSIS — H540 Blindness, both eyes: Secondary | ICD-10-CM | POA: Diagnosis present

## 2015-10-01 DIAGNOSIS — N186 End stage renal disease: Secondary | ICD-10-CM | POA: Diagnosis present

## 2015-10-01 DIAGNOSIS — I2510 Atherosclerotic heart disease of native coronary artery without angina pectoris: Secondary | ICD-10-CM | POA: Diagnosis present

## 2015-10-01 DIAGNOSIS — D631 Anemia in chronic kidney disease: Secondary | ICD-10-CM | POA: Diagnosis present

## 2015-10-01 DIAGNOSIS — I252 Old myocardial infarction: Secondary | ICD-10-CM

## 2015-10-01 DIAGNOSIS — Z8674 Personal history of sudden cardiac arrest: Secondary | ICD-10-CM | POA: Diagnosis not present

## 2015-10-01 HISTORY — DX: End stage renal disease: N18.6

## 2015-10-01 HISTORY — DX: Dependence on renal dialysis: Z99.2

## 2015-10-01 LAB — CBC
HEMATOCRIT: 31.3 % — AB (ref 39.0–52.0)
Hemoglobin: 9.9 g/dL — ABNORMAL LOW (ref 13.0–17.0)
MCH: 30.4 pg (ref 26.0–34.0)
MCHC: 31.6 g/dL (ref 30.0–36.0)
MCV: 96 fL (ref 78.0–100.0)
PLATELETS: 179 10*3/uL (ref 150–400)
RBC: 3.26 MIL/uL — ABNORMAL LOW (ref 4.22–5.81)
RDW: 14.4 % (ref 11.5–15.5)
WBC: 6.8 10*3/uL (ref 4.0–10.5)

## 2015-10-01 LAB — I-STAT TROPONIN, ED
TROPONIN I, POC: 0.04 ng/mL (ref 0.00–0.08)
Troponin i, poc: 0.02 ng/mL (ref 0.00–0.08)

## 2015-10-01 LAB — BASIC METABOLIC PANEL
Anion gap: 12 (ref 5–15)
BUN: 66 mg/dL — AB (ref 6–20)
CO2: 24 mmol/L (ref 22–32)
CREATININE: 9.14 mg/dL — AB (ref 0.61–1.24)
Calcium: 8.6 mg/dL — ABNORMAL LOW (ref 8.9–10.3)
Chloride: 99 mmol/L — ABNORMAL LOW (ref 101–111)
GFR calc Af Amer: 6 mL/min — ABNORMAL LOW (ref >60)
GFR, EST NON AFRICAN AMERICAN: 5 mL/min — AB (ref >60)
GLUCOSE: 130 mg/dL — AB (ref 65–99)
POTASSIUM: 4.4 mmol/L (ref 3.5–5.1)
SODIUM: 135 mmol/L (ref 135–145)

## 2015-10-01 LAB — TROPONIN I: Troponin I: 0.03 ng/mL (ref <0.03)

## 2015-10-01 LAB — CBG MONITORING, ED: Glucose-Capillary: 143 mg/dL — ABNORMAL HIGH (ref 65–99)

## 2015-10-01 MED ORDER — CALCIUM ACETATE (PHOS BINDER) 667 MG PO CAPS
1334.0000 mg | ORAL_CAPSULE | Freq: Three times a day (TID) | ORAL | Status: DC
  Administered 2015-10-02 – 2015-10-03 (×4): 1334 mg via ORAL
  Filled 2015-10-01 (×4): qty 2

## 2015-10-01 MED ORDER — HEPARIN BOLUS VIA INFUSION
4000.0000 [IU] | Freq: Once | INTRAVENOUS | Status: AC
  Administered 2015-10-01: 4000 [IU] via INTRAVENOUS
  Filled 2015-10-01: qty 4000

## 2015-10-01 MED ORDER — ACETAMINOPHEN 325 MG PO TABS: 650.0000 mg | ORAL_TABLET | Freq: Four times a day (QID) | ORAL | Status: DC | PRN

## 2015-10-01 MED ORDER — ONDANSETRON HCL 4 MG/2ML IJ SOLN
4.0000 mg | Freq: Four times a day (QID) | INTRAMUSCULAR | Status: DC | PRN
  Administered 2015-10-01: 4 mg via INTRAVENOUS
  Filled 2015-10-01: qty 2

## 2015-10-01 MED ORDER — ALPRAZOLAM 0.25 MG PO TABS
0.2500 mg | ORAL_TABLET | Freq: Two times a day (BID) | ORAL | Status: DC | PRN
  Administered 2015-10-01: 0.25 mg via ORAL
  Filled 2015-10-01: qty 1

## 2015-10-01 MED ORDER — MORPHINE SULFATE (PF) 2 MG/ML IV SOLN: 2.0000 mg | INTRAVENOUS | Status: DC | PRN

## 2015-10-01 MED ORDER — ATORVASTATIN CALCIUM 40 MG PO TABS
40.0000 mg | ORAL_TABLET | Freq: Every day | ORAL | Status: DC
  Administered 2015-10-02: 40 mg via ORAL
  Filled 2015-10-01: qty 1

## 2015-10-01 MED ORDER — HYDRALAZINE HCL 20 MG/ML IJ SOLN
5.0000 mg | INTRAMUSCULAR | Status: DC | PRN
Start: 2015-10-01 — End: 2015-10-03

## 2015-10-01 MED ORDER — CALCITRIOL 1 MCG/ML PO SOLN: 1.0000 ug | ORAL | Status: DC

## 2015-10-01 MED ORDER — CALCIUM CARBONATE ANTACID 500 MG PO CHEW: 1500.0000 mg | CHEWABLE_TABLET | Freq: Every day | ORAL | Status: DC | PRN

## 2015-10-01 MED ORDER — AMLODIPINE BESYLATE 5 MG PO TABS
5.0000 mg | ORAL_TABLET | Freq: Every day | ORAL | Status: DC
  Administered 2015-10-01: 5 mg via ORAL
  Filled 2015-10-01: qty 1

## 2015-10-01 MED ORDER — RENA-VITE PO TABS
1.0000 | ORAL_TABLET | Freq: Every day | ORAL | Status: DC
  Administered 2015-10-01 – 2015-10-02 (×2): 1 via ORAL
  Filled 2015-10-01 (×2): qty 1

## 2015-10-01 MED ORDER — MORPHINE SULFATE (PF) 4 MG/ML IV SOLN
4.0000 mg | Freq: Once | INTRAVENOUS | Status: AC
  Administered 2015-10-01: 4 mg via INTRAVENOUS
  Filled 2015-10-01: qty 1

## 2015-10-01 MED ORDER — ASPIRIN EC 325 MG PO TBEC
325.0000 mg | DELAYED_RELEASE_TABLET | Freq: Every day | ORAL | Status: DC
  Administered 2015-10-02 – 2015-10-03 (×2): 325 mg via ORAL
  Filled 2015-10-01 (×2): qty 1

## 2015-10-01 MED ORDER — HEPARIN SODIUM (PORCINE) 5000 UNIT/ML IJ SOLN: 5000.0000 [IU] | Freq: Three times a day (TID) | INTRAMUSCULAR | Status: DC

## 2015-10-01 MED ORDER — SENNA 8.6 MG PO TABS
1.0000 | ORAL_TABLET | Freq: Two times a day (BID) | ORAL | Status: DC
  Administered 2015-10-02: 8.6 mg via ORAL
  Filled 2015-10-01 (×3): qty 1

## 2015-10-01 MED ORDER — INSULIN ASPART 100 UNIT/ML ~~LOC~~ SOLN
0.0000 [IU] | Freq: Three times a day (TID) | SUBCUTANEOUS | Status: DC
  Administered 2015-10-02: 2 [IU] via SUBCUTANEOUS

## 2015-10-01 MED ORDER — HEPARIN (PORCINE) IN NACL 100-0.45 UNIT/ML-% IJ SOLN
1100.0000 [IU]/h | INTRAMUSCULAR | Status: DC
  Administered 2015-10-01: 1100 [IU]/h via INTRAVENOUS
  Filled 2015-10-01: qty 250

## 2015-10-01 MED ORDER — CARVEDILOL 3.125 MG PO TABS
3.1250 mg | ORAL_TABLET | Freq: Two times a day (BID) | ORAL | Status: DC
  Administered 2015-10-01 – 2015-10-03 (×3): 3.125 mg via ORAL
  Filled 2015-10-01 (×4): qty 1

## 2015-10-01 MED ORDER — CINACALCET HCL 30 MG PO TABS
30.0000 mg | ORAL_TABLET | Freq: Every day | ORAL | Status: DC
  Administered 2015-10-02: 30 mg via ORAL
  Filled 2015-10-01 (×2): qty 1

## 2015-10-01 MED ORDER — IOPAMIDOL (ISOVUE-370) INJECTION 76%
INTRAVENOUS | Status: AC
  Administered 2015-10-01: 100 mL
  Filled 2015-10-01: qty 100

## 2015-10-01 MED ORDER — NITROGLYCERIN 0.4 MG SL SUBL: 0.4000 mg | SUBLINGUAL_TABLET | SUBLINGUAL | Status: DC | PRN

## 2015-10-01 MED ORDER — TAMSULOSIN HCL 0.4 MG PO CAPS
0.4000 mg | ORAL_CAPSULE | Freq: Every day | ORAL | Status: DC
  Filled 2015-10-01 (×2): qty 1

## 2015-10-01 MED ORDER — INSULIN ASPART 100 UNIT/ML ~~LOC~~ SOLN: 0.0000 [IU] | Freq: Every day | SUBCUTANEOUS | Status: DC

## 2015-10-01 MED ORDER — HYDROMORPHONE HCL 1 MG/ML IJ SOLN
1.0000 mg | INTRAMUSCULAR | Status: DC | PRN
  Administered 2015-10-02: 1 mg via INTRAVENOUS
  Filled 2015-10-01: qty 1

## 2015-10-01 MED ORDER — HYDROMORPHONE HCL 1 MG/ML IJ SOLN
1.0000 mg | Freq: Once | INTRAMUSCULAR | Status: AC
  Administered 2015-10-01: 1 mg via INTRAVENOUS
  Filled 2015-10-01: qty 1

## 2015-10-01 MED ORDER — NITROGLYCERIN IN D5W 200-5 MCG/ML-% IV SOLN
0.0000 ug/min | INTRAVENOUS | Status: DC
  Administered 2015-10-02: 5 ug/min via INTRAVENOUS
  Filled 2015-10-01: qty 250

## 2015-10-01 NOTE — ED Notes (Signed)
MD Blaine Hamper made aware of Troponin result.

## 2015-10-01 NOTE — ED Notes (Signed)
IV team at bedside 

## 2015-10-01 NOTE — H&P (Addendum)
History and Physical    Eric [name] UUV:[id number] DOB: 12/12/1955 DOA: 10/01/2015  Referring MD/NP/PA:   PCP: [name], NP   Patient coming from:  The patient is coming from home.  At baseline, pt is independent for most of ADL.        Chief Complaint: chest pain  HPI: Eric [name] is a 60 y.o. 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  Patient states that he was in dialysis center for scheduled dialysis today, but was not able to start dialysis since he started having chest pain. His chest pain is located in the front chest, constant, 10 out of 10 severity, pressure-like, radiating to both arms and legs. It is not aggravated or alleviated by any known factors. It is not pleuritic. He has mild SOB, but no cough, fever or chills. Patient denies nausea, vomiting, abdominal pain,diarrhea symptoms of UTI or unilateral weakness.,   ED Course: pt was found to have Postoperative troponin 0.03, WBC 6.8, no tachycardia, no tachypnea, potassium normal, creatinine 9.14, BUN 66, bicarbonate 24, CAT of chest and CTA of abdomen/pelvis negative for PE or dissection. Patient is placed on telemetry bed for observation.   Review of Systems:   General: no fevers, chills, no changes in body weight, has poor appetite, has fatigue HEENT: no blurry vision, hearing changes or sore throat Respiratory: has dyspnea, no coughing, wheezing CV: has chest pain, no palpitations GI: no nausea, vomiting, abdominal pain, diarrhea, constipation GU: no dysuria, burning on urination, increased urinary frequency, hematuria  Ext: no leg edema Neuro: no unilateral weakness, numbness, or tingling, no vision change or hearing loss Skin: no rash, no skin tear. MSK: No muscle  spasm, no deformity, no limitation of range of movement in spin Heme: No easy bruising.  Travel history: No recent long distant travel.  Allergy: No Known Allergies  Past Medical History:  Diagnosis Date  . AICD (automatic cardioverter/defibrillator) present   . Cardiac arrest due to underlying cardiac condition ([place])   . CHF (congestive heart failure) ([place])   . CKD (chronic kidney disease), stage III   . Edema   . ESRD on dialysis ([place])   . History of blood transfusion 1950's   "related to pinal menigitis; HAD 16 OPERATIONS TOTAL"  . Hyperlipemia   . Hypertension   . Ileus ([place]) 05/2015  . Myocardial infarction ([place]) 03/2013  . Obesity   . Pneumonia 2016  . Scabies infestation 06/29/2014  . Shortness of breath dyspnea   . Type II diabetes mellitus ([place])    type 2  . Ventricular tachycardia [facility])     Past Surgical History:  Procedure Laterality Date  . AV FISTULA PLACEMENT Right 03/18/2015   Procedure: BRACHIOCEPHALIC ARTERIOVENOUS (AV) FISTULA CREATION;  Surgeon: Angelia Mould, MD;  Location: [facility];  Service: Vascular;  Laterality: Right;  . BACK SURGERY  1950's   "for spinal menigitis; HAD 16 OPERATIONS TOTAL"  . CARDIAC CATHETERIZATION    . CHOLECYSTECTOMY N/A 06/17/2015   Procedure: LAPAROSCOPIC CHOLECYSTECTOMY;  Surgeon: Coralie Keens, MD;  Location: [place];  Service: General;  Laterality: N/A;  . CORONARY ARTERY BYPASS GRAFT  1999   cabg x4  . 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 HEAD SURGERY  1950'S   "for spinal menigitis;  HAD 16 OPERATIONS TOTAL"  . IMPLANTABLE CARDIOVERTER DEFIBRILLATOR IMPLANT  03/24/2013   STJ single chamber ICD implanted by Dr Caryl Comes for secondary prevention  . IMPLANTABLE CARDIOVERTER DEFIBRILLATOR IMPLANT N/A 03/24/2013   Procedure: IMPLANTABLE CARDIOVERTER DEFIBRILLATOR IMPLANT;  Surgeon: Deboraha Sprang, MD;  Location: [facility] CATH LAB;  Service: Cardiovascular;  Laterality: N/A;  . INSERTION OF  DIALYSIS CATHETER N/A 03/13/2015   Procedure: INSERTION OF TUNNELED DIALYSIS CATHETER;  Surgeon: Conrad Smackover, MD;  Location: [place];  Service: Vascular;  Laterality: N/A;  . LEFT HEART CATHETERIZATION WITH CORONARY/GRAFT ANGIOGRAM  03/07/2013   Procedure: LEFT HEART CATHETERIZATION WITH CORONARY/GRAFT ANGIOGRAM;  Surgeon: Clent Demark, MD;  Location: [place] CATH LAB;  Service: Cardiovascular;;  . UMBILICAL HERNIA REPAIR N/A 06/24/2015   Procedure: HERNIA REPAIR UMBILICAL ADULT;  Surgeon: Georganna Skeans, MD;  Location: [place];  Service: General;  Laterality: N/A;    Social History:  reports that he has quit smoking. His smoking use included Cigarettes. He has a 5.00 pack-year smoking history. He has never used smokeless tobacco. He reports that he does not drink alcohol or use drugs.  Family History:  Family History  Problem Relation Age of Onset  . Heart disease Mother   . Diabetes Mother      Prior to Admission medications   Medication Sig Start Date End Date Taking? Authorizing Provider  acetaminophen (TYLENOL) 325 MG tablet Take 2 tablets (650 mg total) by mouth every 6 (six) hours as needed for mild pain or moderate pain. 07/01/15  Yes Smiley Houseman, MD  aspirin EC 81 MG tablet Take 81 mg by mouth daily.   Yes Historical Provider, MD  calcitRIOL (ROCALTROL) 1 MCG/ML solution Place 1 mL (1 mcg total) into feeding tube every Monday, Wednesday, and Friday with hemodialysis. 07/01/15  Yes Smiley Houseman, MD  calcium acetate (PHOSLO) 667 MG capsule Take 2 capsules (1,334 mg total) by mouth 3 (three) times daily with meals. 03/20/15  Yes Maryann Mikhail, DO  calcium carbonate (TUMS EX) 750 MG chewable tablet Chew 2 tablets by mouth daily as needed for heartburn (stomach pain).   Yes Historical Provider, MD  cinacalcet (SENSIPAR) 30 MG tablet Take 30 mg by mouth daily with supper.   Yes Historical Provider, MD  glipiZIDE (GLUCOTROL) 10 MG tablet Take 10 mg by mouth daily. 02/15/15  Yes  Historical Provider, MD  multivitamin (RENA-VIT) TABS tablet Take 1 tablet by mouth at bedtime. 07/01/15  Yes Smiley Houseman, MD  senna (SENOKOT) 8.6 MG TABS tablet Take 1 tablet (8.6 mg total) by mouth 2 (two) times daily. 07/01/15  Yes Smiley Houseman, MD  tamsulosin (FLOMAX) 0.4 MG CAPS capsule Take 1 capsule (0.4 mg total) by mouth daily. 07/01/15  Yes Smiley Houseman, MD  amLODipine (NORVASC) 10 MG tablet Take 1 tablet (10 mg total) by mouth daily. Patient not taking: Reported on 10/01/2015 08/08/14   Theodis Blaze, MD  carvedilol (COREG) 6.25 MG tablet Take 1 tablet (6.25 mg total) by mouth 2 (two) times daily with a meal. Patient not taking: Reported on 10/01/2015 07/01/15   Smiley Houseman, MD    Physical Exam: Vitals:   10/01/15 2015 10/01/15 2030 10/01/15 2045 10/01/15 2100  BP: 136/82 139/76 150/82 155/79  Pulse: 78 82 75 78  Resp: 19 19 15 22   SpO2: 100% 94% 97% 94%  Weight:      Height:       General: Not in acute distress HEENT:  Eyes: PERRL, EOMI, no scleral icterus.       ENT: No discharge from the ears and nose, no pharynx injection, no tonsillar enlargement.        Neck: No JVD, no bruit, no mass felt. Heme: No neck lymph node enlargement. Cardiac: S1/S2, RRR, No murmurs, No gallops or rubs. Respiratory: No rales, wheezing, rhonchi or rubs. GI: Soft, nondistended, nontender, no rebound pain, no organomegaly, BS present. GU: No hematuria Ext: No pitting leg edema bilaterally. 2+DP/PT pulse bilaterally. Has functioning AV fistula in the right arm Musculoskeletal: No joint deformities, No joint redness or warmth, no limitation of ROM in spin. Skin: No rashes. No skin tear. Neuro: Alert, oriented X3, cranial nerves II-XII grossly intact, moves all extremities normally.  Psych: Patient is not psychotic, no suicidal or hemocidal ideation.  Labs on Admission: I have personally reviewed following labs and imaging studies  CBC:  Recent Labs Lab  10/01/15 1424  WBC 6.8  HGB 9.9*  HCT 31.3*  MCV 96.0  PLT 676   Basic Metabolic Panel:  Recent Labs Lab 10/01/15 1424  NA 135  K 4.4  CL 99*  CO2 24  GLUCOSE 130*  BUN 66*  CREATININE 9.14*  CALCIUM 8.6*   GFR: Estimated Creatinine Clearance: 8.7 mL/min (by C-G formula based on SCr of 9.14 mg/dL). Liver Function Tests: No results for input(s): AST, ALT, ALKPHOS, BILITOT, PROT, ALBUMIN in the last 168 hours. No results for input(s): LIPASE, AMYLASE in the last 168 hours. No results for input(s): AMMONIA in the last 168 hours. Coagulation Profile: No results for input(s): INR, PROTIME in the last 168 hours. Cardiac Enzymes:  Recent Labs Lab 10/01/15 2059  TROPONINI 0.03*   BNP (last 3 results) No results for input(s): PROBNP in the last 8760 hours. HbA1C: No results for input(s): HGBA1C in the last 72 hours. CBG: No results for input(s): GLUCAP in the last 168 hours. Lipid Profile: No results for input(s): CHOL, HDL, LDLCALC, TRIG, CHOLHDL, LDLDIRECT in the last 72 hours. Thyroid Function Tests: No results for input(s): TSH, T4TOTAL, FREET4, T3FREE, THYROIDAB in the last 72 hours. Anemia Panel: No results for input(s): VITAMINB12, FOLATE, FERRITIN, TIBC, IRON, RETICCTPCT in the last 72 hours. Urine analysis:    Component Value Date/Time   COLORURINE YELLOW 03/12/2015 0020   APPEARANCEUR CLEAR 03/12/2015 0020   LABSPEC 1.015 03/12/2015 0020   PHURINE 5.5 03/12/2015 0020   GLUCOSEU NEGATIVE 03/12/2015 0020   HGBUR SMALL (A) 03/12/2015 0020   BILIRUBINUR NEGATIVE 03/12/2015 0020   KETONESUR NEGATIVE 03/12/2015 0020   PROTEINUR >300 (A) 03/12/2015 0020   UROBILINOGEN 1.0 08/03/2014 1855   NITRITE NEGATIVE 03/12/2015 0020   LEUKOCYTESUR NEGATIVE 03/12/2015 0020   Sepsis Labs: @LABRCNTIP (procalcitonin:4,lacticidven:4) )No results found for this or any previous visit (from the past 240 hour(s)).   Radiological Exams on Admission: Dg Chest Portable 1  View  Result Date: 10/01/2015 CLINICAL DATA:  Chest pain and shortness of breath. Chronic renal failure. EXAM: PORTABLE CHEST 1 VIEW COMPARISON:  Jun 14, 2015 FINDINGS: There is no edema or consolidation. Heart is borderline enlarged with pulmonary vascularity within normal limits. Pacemaker lead tip is attached to the right ventricle. No pneumothorax. No adenopathy. No bone lesions. IMPRESSION: No edema or consolidation. Stable cardiac prominence. No change in pacemaker lead position. No pneumothorax. Electronically Signed   By: Lowella Grip III M.D.   On: 10/01/2015 14:59   Ct Angio Chest/abd/pel For Dissection W And/or Wo Contrast  Result Date: 10/01/2015 CLINICAL DATA:  Chest pain, assess for dissection. History of pneumonia, CHF, hypertension, end-stage renal disease on dialysis, small bowel obstruction, diabetes, cirrhosis, pancreatitis. EXAM: CT ANGIOGRAPHY CHEST, ABDOMEN AND PELVIS TECHNIQUE: Multidetector CT imaging through the chest, abdomen and pelvis was performed using the standard protocol during bolus administration of intravenous contrast. Multiplanar reconstructed images and MIPs were obtained and reviewed to evaluate the vascular anatomy. CONTRAST:  100 cc Isovue 370 COMPARISON:  Chest radiograph July 31, 2015 at 1444 hours and CT chest March 18, 2015 and CT abdomen and pelvis June 24, 2015 FINDINGS: CTA CHEST FINDINGS ARTERIES: Thoracic aorta is normal course and caliber. No intrinsic density on noncontrast CT. Homogeneous contrast opacification of thoracic aorta without dissection, aneurysm, luminal irregularity, periaortic fluid collections, or contrast extravasation. Mild calcific atherosclerosis. MEDIASTINUM: The heart is moderately enlarged unchanged. Moderate coronary artery calcifications. No pericardial effusions. Cardiac pacemaker with lead tips in RIGHT ventricle. LUNGS: Focal soft tissue RIGHT trachea, as this is new from prior CT, disc most compatible with aspiration. Lingula  calcified granuloma. 5 mm sub solid RIGHT middle lobe subpleural pulmonary nodule was 9 mm. Dependent atelectasis. No pleural effusion or focal consolidation, improved from prior CT. No central pulmonary embolus. SOFT TISSUES AND OSSEOUS STRUCTURES: Focal sclerosis RIGHT humeral head, possible infarct or old trauma. Status post median sternotomy. Review of the MIP images confirms the above findings. CTA ABDOMEN AND PELVIS FINDINGS ARTERIES: Abdominal aorta is normal course and caliber. Homogeneous contrast opacification of aortoiliac vessels without dissection, aneurysm, luminal irregularity, periaortic fluid collections, or contrast extravasation. Moderate calcific atherosclerosis. Severe stenosis celiac access origin. Widely patent superior mesenteric artery origin. Widely patent RIGHT renal artery origin, no demonstrable LEFT renal artery. Moderate stenosis inferior mesenteric artery origin, which is patent. Tandem stenoses bilateral internal iliac artery branches. SOLID ORGANS: The pancreas and adrenal glands are unremarkable. Nodular liver. No hyper enhancing masses. Status post cholecystectomy. Mild splenomegaly. GASTROINTESTINAL TRACT: The stomach, small and large bowel are normal in course and caliber without inflammatory changes. Moderate colonic diverticulosis. Normal appendix. KIDNEYS/ URINARY TRACT: Severely atrophic LEFT kidney. RIGHT kidney is orthotopic, without hydronephrosis or definite renal mass is. Limited assessment for nonobstructing nephrolithiasis due to bolus timing. PERITONEUM/RETROPERITONEUM: No intraperitoneal free fluid nor free air. Aortoiliac vessels are normal in course and caliber. Similar mild porta hepatis and portacaval lymphadenopathy is likely reactive. Internal reproductive organs are unremarkable. SOFT TISSUE/OSSEOUS STRUCTURES: Nonsuspicious. Status post L2-3 laminectomies. Review of the MIP images confirms the above findings. IMPRESSION: CTA CHEST: No acute vascular process,  no dissection. Stable cardiomegaly. Tracheal aspirate. Improved aeration lungs.  Dependent atelectasis. CTA ABDOMEN AND PELVIS: No acute vascular process, no dissection. Severe stenosis celiac axis origin. Moderate stenosis IMA origin. SMA widely patent. No acute intra-abdominal or pelvic process. Cirrhosis without liver failure.  Mild splenomegaly. Electronically Signed   By: Elon Alas M.D.   On: 10/01/2015 19:49     EKG: Independently reviewed. Sinus rhythm, QTC 444, LAD, nonspecific T-wave change.  Assessment/Plan Principal Problem:   Pain in the chest Active Problems:   Chest pain   AICD (automatic cardioverter/defibrillator) present   Essential hypertension, benign   Diabetes mellitus type 2, controlled ([place])   ESRD on dialysis ([place])   CAD (coronary artery disease)   Chronic systolic (congestive) heart failure (H[facility])   Chest pain: pt has hx of CAD and MI. Now presents with pressure-like persistent chest pain with mildly elevated troponin 0.03, concerning for non-STEMI. CT angiogram of the chest and abdomen/pelvis are negative for PE or dissection.  -  will place on Tele bed for obs - start IV heparin - cycle CE q6 x3 and repeat her EKG in the am  - prn Nitroglycerin, Morphine, and aspirin, coreg - start lipitor - Risk factor stratification: will check FLP,UDS and A1C  - 2d echo - DEP called, card, Dr. Skip Estimable, but stating that please call back if needed   Addendum: pt has persistent chest pain, tele floor does not take pt. He is likely to stay in hospital more than 2 days-->will change bed to SDU and as inpt status -I dicussed with on call Card, Dr. Eula Fried who recommended to start IV nitroglycerin drip for better blood pressure control -will change prn morphine to Dilaudid  HTN: Blood pressure 182/106--> 136/82. Patient's not taking home medications, amlodipine and Coreg -Restart amlodipine at Coreg -IV hydralazine when necessary  DM-II: Last A1c 5.9 on 03/12/15, well  controled. Patient is taking glipizide at home -SSI  ESRD on dialysis (MWF): could not do HD today due to chest pain. Potassium 4.4, bicarbonate 24, creatinine 9.41, BUN 66 -Continue calcitriol, PhosLo, calcium carbonate Sensipar -Renal, Dr. Jonnie Finner was consulted for dialysis  Chronic systolic (congestive) heart failure ([place]): 2-D echo on 03/11/15 showed a year for 45-50%. No leg edema JVD appreciated with compensated. -Volume management per renal   DVT ppx: on IV SQ Heparin  Code Status: Full code Family Communication: None at bed side. Disposition Plan:  Anticipate discharge back to previous home environment Consults called:  Renal, dr. Jonnie Finner Admission status: Obs / tele   Date of Service 10/01/2015    Ivor Costa [facility] Pager [phone]  If 7PM-7AM, please contact night-coverage [email or web address] Password [facility] 10/01/2015, 9:49 PM

## 2015-10-01 NOTE — ED Triage Notes (Signed)
Onset today patient getting ready for dialysis right upper arm was accessed at dialysis center and developed chest pain pressure with shortness of breath. Arrived via EMS administered 2 nitro and 324 mg aspirin. No change in symptoms.

## 2015-10-01 NOTE — Progress Notes (Signed)
ANTICOAGULATION CONSULT NOTE - Initial Consult  Pharmacy Consult for Heparin Indication: chest pain/ACS  No Known Allergies  Patient Measurements: Height: 5\' 6"  (167.6 cm) Weight: 188 lb (85.3 kg) IBW/kg (Calculated) : 63.8 Heparin Dosing Weight: 81.5 kg  Vital Signs: BP: 155/79 (09/08 2100) Pulse Rate: 78 (09/08 2100)  Labs:  Recent Labs  10/01/15 1424 10/01/15 2059  HGB 9.9*  --   HCT 31.3*  --   PLT 179  --   CREATININE 9.14*  --   TROPONINI  --  0.03*    Estimated Creatinine Clearance: 8.7 mL/min (by C-G formula based on SCr of 9.14 mg/dL).   Medical History: Past Medical History:  Diagnosis Date  . AICD (automatic cardioverter/defibrillator) present   . Cardiac arrest due to underlying cardiac condition ([place])   . CHF (congestive heart failure) ([place])   . CKD (chronic kidney disease), stage III   . Edema   . ESRD on dialysis ([place])   . History of blood transfusion 1950's   "related to pinal menigitis; HAD 16 OPERATIONS TOTAL"  . Hyperlipemia   . Hypertension   . Ileus ([place]) 05/2015  . Myocardial infarction ([place]) 03/2013  . Obesity   . Pneumonia 2016  . Scabies infestation 06/29/2014  . Shortness of breath dyspnea   . Type II diabetes mellitus ([place])    type 2  . Ventricular tachycardia ([facility])     Medications:   (Not in a hospital admission) Scheduled:  . amLODipine  5 mg Oral Daily  . [START ON 10/02/2015] aspirin EC  325 mg Oral Daily  . atorvastatin  40 mg Oral q1800  . [START ON 10/04/2015] calcitRIOL  1 mcg Per Tube Q M,W,F-HD  . [START ON 10/02/2015] calcium acetate  1,334 mg Oral TID WC  . carvedilol  3.125 mg Oral BID WC  . [START ON 10/02/2015] cinacalcet  30 mg Oral Q supper  .  HYDROmorphone (DILAUDID) injection  1 mg Intravenous Once  . multivitamin  1 tablet Oral QHS  . senna  1 tablet Oral BID  . tamsulosin  0.4 mg Oral Daily   Infusions:    Assessment: 60yo male with history of ESRD on HD presents with CP during HD today. Pharmacy is  consulted to dose heparin for ACS/chest pain.   Goal of Therapy:  Heparin level 0.3-0.7 units/ml Monitor platelets by anticoagulation protocol: Yes   Plan:  Give 4000 units bolus x 1 Start heparin infusion at 1100 units/hr Check anti-Xa level in 8 hours and daily while on heparin Continue to monitor H&H and platelets  Sharilyn Sites M 10/01/2015,9:55 PM

## 2015-10-01 NOTE — ED Provider Notes (Signed)
[facility] DEPT Provider Note   CSN: [id number] Arrival date & time: 10/01/15  1403     History   Chief Complaint Chief Complaint  Patient presents with  . Chest Pain    HPI Eric [name] is a 60 y.o. male.  HPI  60 y.o. male with hx of CABG (1999), cardiac arrest. Risk factors include HTN, HLD, DM, hx of smoking, and ESRD on Dialysis presents to the Emergency Department today complaining of chest pain with onset during dialysis today. Pt states that as they were getting access to his fistula, he developed CP. Did not receive treatment. Transported by EMS and given ASA and NTG without relief. Notes pain 10/10 in central chest. Described as an intense pressure. No diaphoresis. No N/V. No SOB. Recently admitted for CP r/o on 07-01-15 due to elevated troponins. Cardiology was consulted at that time and a myoview showing  a medium defect of mild severity present in the basal inferior and mid inferior location, findings consistent with prior myocardial infarction. The left ventricular ejection fraction is mildly decreased (45-54%). Nuclear stress EF: 51%. No fevers. No headaches. Pt states that he did not take any of his BP medications today due to him usually taking them after his Dialysis treatments on MWF. No hx DVT/PE. No recent surgeries. No recent long distance travel. No other symptoms noted.   Past Medical History:  Diagnosis Date  . AICD (automatic cardioverter/defibrillator) present   . Cardiac arrest due to underlying cardiac condition ([place])   . CHF (congestive heart failure) ([place])   . CKD (chronic kidney disease), stage III   . Edema   . History of blood transfusion 1950's   "related to pinal menigitis; HAD 16 OPERATIONS TOTAL"  . Hyperlipemia   . Hypertension   . Ileus ([place]) 05/2015  . Myocardial infarction ([place]) 03/2013  . Obesity   . Pneumonia 2016  . Scabies infestation 06/29/2014  . Shortness of breath dyspnea   . Type II diabetes mellitus ([place])    type 2  .  Ventricular tachycardia [facility])     Patient Active Problem List   Diagnosis Date Noted  . Encounter for imaging study to confirm nasogastric (NG) tube placement   . Encounter for nasogastric tube placement   . Ileus, postoperative   . Impaired nasal gastric feeding tube   . Impaired nasogastric feeding tube   . Abdominal distension   . Ileus ([place])   . ESRD (end stage renal disease) ([place])   . Gallstones   . Gallbladder calculus 06/15/2015  . Pancreatitis   . Numbness   . RUQ abdominal pain   . CHF exacerbation ([place]) 03/10/2015  . Heart failure, acute on chronic, systolic and diastolic ([place]) 26/71/2458  . Acute on chronic congestive heart failure ([place])   . Diabetes mellitus with complication ([place])   . Acute-on-chronic kidney injury ([place])   . CHF (congestive heart failure) ([place]) 08/03/2014  . Hypertensive urgency 08/03/2014  . Diabetes mellitus type 2, controlled ([place]) 08/03/2014  . Obesity (BMI 30-39.9) 08/03/2014  . Bilateral leg pain 08/03/2014  . Acute on chronic combined systolic and diastolic CHF (congestive heart failure) ([place]) 07/02/2014  . Acute renal failure superimposed on stage 3 chronic kidney disease ([place]) 07/01/2014  . Acute renal failure ([place]) 06/29/2014  . CKD (chronic kidney disease) stage 3, GFR 30-59 ml/min 06/29/2014  . DM (diabetes mellitus), type 2 with renal complications ([place]) 09/98/3382  . Rash and nonspecific skin eruption 06/29/2014  . AICD (automatic cardioverter/defibrillator) present 06/29/2014  .  Essential hypertension, benign 06/29/2014  . Hepatic cirrhosis ([place]) 11/28/2013  . Cardiomyopathy, ischemic 11/28/2013  . Hepatitis C antibody test positive 11/28/2013  . Cirrhosis ([place])   . Pain in the chest   . Type 2 diabetes mellitus with hyperglycemia ([place])   . Chest pain 11/26/2013  . Diabetes mellitus with hypoglycemia ([place]) 04/29/2013  . HCAP (healthcare-associated pneumonia) 04/28/2013  . Fever 04/28/2013  . Acute respiratory failure ([place])  04/28/2013  . DM type 2 (diabetes mellitus, type 2) ([place]) 04/28/2013  . Pneumonia 04/28/2013  . Hypertension   . Hyperlipemia   . Ventricular fibrillation ([place]) 03/08/2013  . Cardiac arrest ([place]) 03/07/2013  . Seizure ([place]) 03/07/2013  . Acute respiratory failure with hypoxia ([place]) 03/07/2013    Past Surgical History:  Procedure Laterality Date  . AV FISTULA PLACEMENT Right 03/18/2015   Procedure: BRACHIOCEPHALIC ARTERIOVENOUS (AV) FISTULA CREATION;  Surgeon: Angelia Mould, MD;  Location: [facility];  Service: Vascular;  Laterality: Right;  . BACK SURGERY  1950's   "for spinal menigitis; HAD 16 OPERATIONS TOTAL"  . CARDIAC CATHETERIZATION    . CHOLECYSTECTOMY N/A 06/17/2015   Procedure: LAPAROSCOPIC CHOLECYSTECTOMY;  Surgeon: Coralie Keens, MD;  Location: [facility];  Service: General;  Laterality: N/A;  . CORONARY ARTERY BYPASS GRAFT  1999   cabg x4  . 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 HEAD SURGERY  1950'S   "for spinal menigitis; HAD 16 OPERATIONS TOTAL"  . IMPLANTABLE CARDIOVERTER DEFIBRILLATOR IMPLANT  03/24/2013   STJ single chamber ICD implanted by Dr Caryl Comes for secondary prevention  . IMPLANTABLE CARDIOVERTER DEFIBRILLATOR IMPLANT N/A 03/24/2013   Procedure: IMPLANTABLE CARDIOVERTER DEFIBRILLATOR IMPLANT;  Surgeon: Deboraha Sprang, MD;  Location: [facility] CATH LAB;  Service: Cardiovascular;  Laterality: N/A;  . INSERTION OF DIALYSIS CATHETER N/A 03/13/2015   Procedure: INSERTION OF TUNNELED DIALYSIS CATHETER;  Surgeon: Conrad [place], MD;  Location: [facility];  Service: Vascular;  Laterality: N/A;  . LEFT HEART CATHETERIZATION WITH CORONARY/GRAFT ANGIOGRAM  03/07/2013   Procedure: LEFT HEART CATHETERIZATION WITH CORONARY/GRAFT ANGIOGRAM;  Surgeon: Clent Demark, MD;  Location: [facility] CATH LAB;  Service: Cardiovascular;;  . UMBILICAL HERNIA REPAIR N/A 06/24/2015   Procedure: HERNIA REPAIR UMBILICAL ADULT;  Surgeon: Georganna Skeans, MD;  Location: [facility];   Service: General;  Laterality: N/A;       Home Medications    Prior to Admission medications   Medication Sig Start Date End Date Taking? Authorizing Provider  acetaminophen (TYLENOL) 325 MG tablet Take 2 tablets (650 mg total) by mouth every 6 (six) hours as needed for mild pain or moderate pain. 07/01/15   Smiley Houseman, MD  amLODipine (NORVASC) 10 MG tablet Take 1 tablet (10 mg total) by mouth daily. 08/08/14   Theodis Blaze, MD  calcitRIOL (ROCALTROL) 1 MCG/ML solution Place 1 mL (1 mcg total) into feeding tube every Monday, Wednesday, and Friday with hemodialysis. 07/01/15   Smiley Houseman, MD  calcium acetate (PHOSLO) 667 MG capsule Take 2 capsules (1,334 mg total) by mouth 3 (three) times daily with meals. 03/20/15   Maryann Mikhail, DO  calcium carbonate (TUMS EX) 750 MG chewable tablet Chew 2 tablets by mouth daily as needed for heartburn (stomach pain).    Historical Provider, MD  carvedilol (COREG) 6.25 MG tablet Take 1 tablet (6.25 mg total) by mouth 2 (two) times daily with a meal. 07/01/15   Smiley Houseman, MD  cinacalcet (SENSIPAR) 30 MG tablet  Take 30 mg by mouth daily with supper.    Historical Provider, MD  glipiZIDE (GLUCOTROL) 10 MG tablet Take 10 mg by mouth daily. 02/15/15   Historical Provider, MD  isosorbide mononitrate (IMDUR) 60 MG 24 hr tablet Take 60 mg by mouth every morning. 02/15/15   Historical Provider, MD  multivitamin (RENA-VIT) TABS tablet Take 1 tablet by mouth at bedtime. 07/01/15   Smiley Houseman, MD  polyethylene glycol (MIRALAX / GLYCOLAX) packet Take 17 g by mouth 2 (two) times daily. 07/01/15   Smiley Houseman, MD  psyllium (HYDROCIL/METAMUCIL) 95 % PACK Take 1 packet by mouth 2 (two) times daily. 07/01/15   Smiley Houseman, MD  senna (SENOKOT) 8.6 MG TABS tablet Take 1 tablet (8.6 mg total) by mouth 2 (two) times daily. 07/01/15   Smiley Houseman, MD  tamsulosin (FLOMAX) 0.4 MG CAPS capsule Take 1 capsule (0.4 mg total) by mouth daily.  07/01/15   Smiley Houseman, MD    Family History Family History  Problem Relation Age of Onset  . Heart disease Mother   . Diabetes Mother     Social History Social History  Substance Use Topics  . Smoking status: Former Smoker    Packs/day: 1.00    Years: 5.00    Types: Cigarettes  . Smokeless tobacco: Never Used     Comment: "quit smoking in the 1960's"  . Alcohol use No     Allergies   Review of patient's allergies indicates no known allergies.   Review of Systems Review of Systems ROS reviewed and all are negative for acute change except as noted in the HPI.    Physical Exam Updated Vital Signs SpO2 100%   Physical Exam  Constitutional: He is oriented to person, place, and time. Vital signs are normal. He appears well-developed and well-nourished.  HENT:  Head: Normocephalic.  Right Ear: Hearing normal.  Left Ear: Hearing normal.  Eyes: Conjunctivae and EOM are normal. Pupils are equal, round, and reactive to light.  Neck: Normal range of motion. Neck supple.  Cardiovascular: Normal rate, regular rhythm, normal heart sounds, intact distal pulses and normal pulses.   Pulmonary/Chest: Effort normal and breath sounds normal. No accessory muscle usage. No respiratory distress. He exhibits bony tenderness (central chest).  Abdominal: Soft.  Neurological: He is alert and oriented to person, place, and time.  Skin: Skin is warm and dry.  Psychiatric: He has a normal mood and affect. His speech is normal and behavior is normal. Thought content normal.  Nursing note and vitals reviewed.  ED Treatments / Results  Labs (all labs ordered are listed, but only abnormal results are displayed) Labs Reviewed  BASIC METABOLIC PANEL - Abnormal; Notable for the following:       Result Value   Chloride 99 (*)    Glucose, Bld 130 (*)    BUN 66 (*)    Creatinine, Ser 9.14 (*)    Calcium 8.6 (*)    GFR calc non Af Amer 5 (*)    GFR calc Af Amer 6 (*)    All other  components within normal limits  CBC - Abnormal; Notable for the following:    RBC 3.26 (*)    Hemoglobin 9.9 (*)    HCT 31.3 (*)    All other components within normal limits  Randolm Idol, ED  Randolm Idol, ED    EKG  EKG Interpretation  Date/Time:  Friday October 01 2015 14:11:14 EDT Ventricular Rate:  85  PR Interval:    QRS Duration: 84 QT Interval:  373 QTC Calculation: 444 R Axis:   -20 Text Interpretation:  Sinus rhythm Left atrial enlargement Borderline left axis deviation No significant change was found Confirmed by Wyvonnia Dusky  MD, STEPHEN [phone]) on 10/01/2015 2:23:59 PM       Radiology Dg Chest Portable 1 View  Result Date: 10/01/2015 CLINICAL DATA:  Chest pain and shortness of breath. Chronic renal failure. EXAM: PORTABLE CHEST 1 VIEW COMPARISON:  Jun 14, 2015 FINDINGS: There is no edema or consolidation. Heart is borderline enlarged with pulmonary vascularity within normal limits. Pacemaker lead tip is attached to the right ventricle. No pneumothorax. No adenopathy. No bone lesions. IMPRESSION: No edema or consolidation. Stable cardiac prominence. No change in pacemaker lead position. No pneumothorax. Electronically Signed   By: Lowella Grip III M.D.   On: 10/01/2015 14:59   Ct Angio Chest/abd/pel For Dissection W And/or Wo Contrast  Result Date: 10/01/2015 CLINICAL DATA:  Chest pain, assess for dissection. History of pneumonia, CHF, hypertension, end-stage renal disease on dialysis, small bowel obstruction, diabetes, cirrhosis, pancreatitis. EXAM: CT ANGIOGRAPHY CHEST, ABDOMEN AND PELVIS TECHNIQUE: Multidetector CT imaging through the chest, abdomen and pelvis was performed using the standard protocol during bolus administration of intravenous contrast. Multiplanar reconstructed images and MIPs were obtained and reviewed to evaluate the vascular anatomy. CONTRAST:  100 cc Isovue 370 COMPARISON:  Chest radiograph July 31, 2015 at 1444 hours and CT chest March 18, 2015 and CT abdomen and pelvis June 24, 2015 FINDINGS: CTA CHEST FINDINGS ARTERIES: Thoracic aorta is normal course and caliber. No intrinsic density on noncontrast CT. Homogeneous contrast opacification of thoracic aorta without dissection, aneurysm, luminal irregularity, periaortic fluid collections, or contrast extravasation. Mild calcific atherosclerosis. MEDIASTINUM: The heart is moderately enlarged unchanged. Moderate coronary artery calcifications. No pericardial effusions. Cardiac pacemaker with lead tips in RIGHT ventricle. LUNGS: Focal soft tissue RIGHT trachea, as this is new from prior CT, disc most compatible with aspiration. Lingula calcified granuloma. 5 mm sub solid RIGHT middle lobe subpleural pulmonary nodule was 9 mm. Dependent atelectasis. No pleural effusion or focal consolidation, improved from prior CT. No central pulmonary embolus. SOFT TISSUES AND OSSEOUS STRUCTURES: Focal sclerosis RIGHT humeral head, possible infarct or old trauma. Status post median sternotomy. Review of the MIP images confirms the above findings. CTA ABDOMEN AND PELVIS FINDINGS ARTERIES: Abdominal aorta is normal course and caliber. Homogeneous contrast opacification of aortoiliac vessels without dissection, aneurysm, luminal irregularity, periaortic fluid collections, or contrast extravasation. Moderate calcific atherosclerosis. Severe stenosis celiac access origin. Widely patent superior mesenteric artery origin. Widely patent RIGHT renal artery origin, no demonstrable LEFT renal artery. Moderate stenosis inferior mesenteric artery origin, which is patent. Tandem stenoses bilateral internal iliac artery branches. SOLID ORGANS: The pancreas and adrenal glands are unremarkable. Nodular liver. No hyper enhancing masses. Status post cholecystectomy. Mild splenomegaly. GASTROINTESTINAL TRACT: The stomach, small and large bowel are normal in course and caliber without inflammatory changes. Moderate colonic diverticulosis.  Normal appendix. KIDNEYS/ URINARY TRACT: Severely atrophic LEFT kidney. RIGHT kidney is orthotopic, without hydronephrosis or definite renal mass is. Limited assessment for nonobstructing nephrolithiasis due to bolus timing. PERITONEUM/RETROPERITONEUM: No intraperitoneal free fluid nor free air. Aortoiliac vessels are normal in course and caliber. Similar mild porta hepatis and portacaval lymphadenopathy is likely reactive. Internal reproductive organs are unremarkable. SOFT TISSUE/OSSEOUS STRUCTURES: Nonsuspicious. Status post L2-3 laminectomies. Review of the MIP images confirms the above findings. IMPRESSION: CTA CHEST: No acute vascular process, no dissection. Stable  cardiomegaly. Tracheal aspirate. Improved aeration lungs.  Dependent atelectasis. CTA ABDOMEN AND PELVIS: No acute vascular process, no dissection. Severe stenosis celiac axis origin. Moderate stenosis IMA origin. SMA widely patent. No acute intra-abdominal or pelvic process. Cirrhosis without liver failure.  Mild splenomegaly. Electronically Signed   By: Elon Alas M.D.   On: 10/01/2015 19:49    Procedures Procedures (including critical care time)  Medications Ordered in ED Medications - No data to display   Initial Impression / Assessment and Plan / ED Course  I have reviewed the triage vital signs and the nursing notes.  Pertinent labs & imaging results that were available during my care of the patient were reviewed by me and considered in my medical decision making (see chart for details).  Clinical Course    Final Clinical Impressions(s) / ED Diagnoses  I have reviewed and evaluated the relevant laboratory valuesI have reviewed and evaluated the relevant imaging studies. I have interpreted the relevant EKG.I have reviewed the relevant previous healthcare records.I have reviewed EMS Documentation.I obtained HPI from historian. Patient discussed with supervising physician  ED Course:  Assessment: Pt is a 21yM with  hx CABG (1999), cardiac arrest. Risk factors include HTN, HLD, DM, hx of smoking, and ESRD on Dialysis who presents with CP during dialysis today. Did not receive dialysis treatment. No hx same. Admission on 07-01-15 Cardiology was consulted at that time and a myoview showing  a medium defect of mild severity present in the basal inferior and mid inferior location, findings consistent with prior myocardial infarction. The left ventricular ejection fraction is mildly decreased (45-54%). Nuclear stress EF: 51%. On exam, pt in NAD. Nontoxic/nonseptic appearing. VSS. Afebrile. Lungs CTA. Heart RRR. Abdomen nontender soft. Chest wall tenderness on sternum. iStat Trop negative CBC unremarkable. BMP with elevated Creatinine as suspected due to patient not receiving Dialysis today. Potassium WNL. CXR negative. Heart Score 4. Concern for possible dissection. Given morphine in ED with minimal relief. Seen by supervising physician.   4:15 PM- Spoke with Medical illustrator (Nephrology). Ok to CT Angio. Dialysis Tomorrow.  4:49 PM- Spoke with Dr. Einar Gip (Cardiology). Admission to medicine for CP evaluation. Available for consult.  7:58 PM CT Angio unremarkable. [facility] for CP rule out.   Disposition/Plan:  Admit to family medicine Pt acknowledges and agrees with plan  Supervising Physician Ezequiel Essex, MD   Final diagnoses:  Chest pain, unspecified chest pain type    New Prescriptions New Prescriptions   No medications on file     Shary Decamp, PA-C 10/01/15 2005    Ezequiel Essex, MD 10/01/15 2121

## 2015-10-02 DIAGNOSIS — E785 Hyperlipidemia, unspecified: Secondary | ICD-10-CM

## 2015-10-02 DIAGNOSIS — E1122 Type 2 diabetes mellitus with diabetic chronic kidney disease: Secondary | ICD-10-CM

## 2015-10-02 LAB — RENAL FUNCTION PANEL
Albumin: 3 g/dL — ABNORMAL LOW (ref 3.5–5.0)
Anion gap: 12 (ref 5–15)
BUN: 71 mg/dL — ABNORMAL HIGH (ref 6–20)
CO2: 21 mmol/L — ABNORMAL LOW (ref 22–32)
Calcium: 8.4 mg/dL — ABNORMAL LOW (ref 8.9–10.3)
Chloride: 99 mmol/L — ABNORMAL LOW (ref 101–111)
Creatinine, Ser: 10.03 mg/dL — ABNORMAL HIGH (ref 0.61–1.24)
GFR calc Af Amer: 6 mL/min — ABNORMAL LOW (ref >60)
GFR calc non Af Amer: 5 mL/min — ABNORMAL LOW (ref >60)
Glucose, Bld: 101 mg/dL — ABNORMAL HIGH (ref 65–99)
Phosphorus: 7.1 mg/dL — ABNORMAL HIGH (ref 2.5–4.6)
Potassium: 4.8 mmol/L (ref 3.5–5.1)
Sodium: 132 mmol/L — ABNORMAL LOW (ref 135–145)

## 2015-10-02 LAB — CBC
HCT: 31.5 % — ABNORMAL LOW (ref 39.0–52.0)
HEMOGLOBIN: 9.7 g/dL — AB (ref 13.0–17.0)
MCH: 29.6 pg (ref 26.0–34.0)
MCHC: 30.8 g/dL (ref 30.0–36.0)
MCV: 96 fL (ref 78.0–100.0)
Platelets: 210 10*3/uL (ref 150–400)
RBC: 3.28 MIL/uL — AB (ref 4.22–5.81)
RDW: 14.7 % (ref 11.5–15.5)
WBC: 6.4 10*3/uL (ref 4.0–10.5)

## 2015-10-02 LAB — LIPID PANEL
CHOLESTEROL: 115 mg/dL (ref 0–200)
HDL: 23 mg/dL — ABNORMAL LOW (ref >40)
LDL Cholesterol: 74 mg/dL (ref 0–99)
Total CHOL/HDL Ratio: 5 RATIO
Triglycerides: 91 mg/dL (ref <150)
VLDL: 18 mg/dL (ref 0–40)

## 2015-10-02 LAB — TROPONIN I
TROPONIN I: 0.04 ng/mL — AB (ref <0.03)
Troponin I: 0.03 ng/mL (ref <0.03)

## 2015-10-02 LAB — RAPID URINE DRUG SCREEN, HOSP PERFORMED
AMPHETAMINES: NOT DETECTED
BARBITURATES: NOT DETECTED
BENZODIAZEPINES: NOT DETECTED
Cocaine: NOT DETECTED
Opiates: POSITIVE — AB
TETRAHYDROCANNABINOL: NOT DETECTED

## 2015-10-02 LAB — GLUCOSE, CAPILLARY
Glucose-Capillary: 76 mg/dL (ref 65–99)
Glucose-Capillary: 91 mg/dL (ref 65–99)
Glucose-Capillary: 156 mg/dL — ABNORMAL HIGH (ref 65–99)
Glucose-Capillary: 175 mg/dL — ABNORMAL HIGH (ref 65–99)

## 2015-10-02 LAB — MRSA PCR SCREENING: MRSA by PCR: NEGATIVE

## 2015-10-02 LAB — HEPARIN LEVEL (UNFRACTIONATED): HEPARIN UNFRACTIONATED: 0.37 [IU]/mL (ref 0.30–0.70)

## 2015-10-02 MED ORDER — LIDOCAINE-PRILOCAINE 2.5-2.5 % EX CREA: 1.0000 "application " | TOPICAL_CREAM | CUTANEOUS | Status: DC | PRN

## 2015-10-02 MED ORDER — SODIUM CHLORIDE 0.9 % IV SOLN: 100.0000 mL | INTRAVENOUS | Status: DC | PRN

## 2015-10-02 MED ORDER — PENTAFLUOROPROP-TETRAFLUOROETH EX AERO
1.0000 | INHALATION_SPRAY | CUTANEOUS | Status: DC | PRN
Start: 2015-10-02 — End: 2015-10-03

## 2015-10-02 MED ORDER — DOXERCALCIFEROL 0.5 MCG PO CAPS
1.5000 ug | ORAL_CAPSULE | ORAL | Status: DC
  Administered 2015-10-02: 1.5 ug via ORAL

## 2015-10-02 MED ORDER — ALTEPLASE 2 MG IJ SOLR: 2.0000 mg | Freq: Once | INTRAMUSCULAR | Status: DC | PRN

## 2015-10-02 MED ORDER — DOXERCALCIFEROL 0.5 MCG PO CAPS
ORAL_CAPSULE | ORAL | Status: AC
  Administered 2015-10-02: 1.5 ug via ORAL
  Filled 2015-10-02: qty 3

## 2015-10-02 MED ORDER — AMLODIPINE BESYLATE 5 MG PO TABS: 5.0000 mg | ORAL_TABLET | Freq: Every day | ORAL | Status: DC

## 2015-10-02 MED ORDER — NEPRO/CARBSTEADY PO LIQD
237.0000 mL | Freq: Two times a day (BID) | ORAL | Status: DC
  Filled 2015-10-02 (×5): qty 237

## 2015-10-02 MED ORDER — LIDOCAINE HCL (PF) 1 % IJ SOLN: 5.0000 mL | INTRAMUSCULAR | Status: DC | PRN

## 2015-10-02 MED ORDER — HEPARIN SODIUM (PORCINE) 1000 UNIT/ML DIALYSIS
1000.0000 [IU] | INTRAMUSCULAR | Status: DC | PRN
  Filled 2015-10-02: qty 1

## 2015-10-02 MED ORDER — HYDROMORPHONE HCL 1 MG/ML IJ SOLN
1.0000 mg | Freq: Four times a day (QID) | INTRAMUSCULAR | Status: DC | PRN
  Administered 2015-10-03: 1 mg via INTRAVENOUS
  Filled 2015-10-02: qty 1

## 2015-10-02 NOTE — Consult Note (Signed)
[facility] Renal Consultation Note    Indication for Consultation:  Management of ESRD/hemodialysis; anemia, hypertension/volume and secondary hyperparathyroidism PCP: [name], [name], NP  Cardiologist: Dr. Einar Gip.   HPI: Eric [name] is a 60 y.o. male with ESRD on hemodialysis since 02/2015 H/O hypertension, solitary R kidney, H/O CABG 1025, H/O systolic HF, MI, AICD plaement (H/O ventricular tachycardia), DMT2, scabies, hyperlipidemia, obesity, lab cholecystectomy 05/2015, hernia repair 06/2015, chronic hepatitis C /hepatic cirrhosis.    Patient presented to HD unit yesterday, was placed on HD and started to C/O pericordial chest pain  radiating down both arms and legs. Rated pain 10/10. Denies N,V, diaphoresis, pain did not worsen with inspiration, had mild SOB. He was transported via EMS to [facility] for evaluation. On arrival to ED, EKG showed SR L axis deviation, CXR WNL, Troponin 0.02, K+ 4.4, HGB 9.9 WBC 6.8 PLT 179.   He was admitted for evaluation of chest pain. Cardiology has been consulted. We were asked to manage HD. HD was deferred 10/01/15 D/T ongoing chest pain, no HD needs on arrival to HD. Plan for HD off schedule today.   Currently patient still C/O intermittent chest pain-motions across upper chest.reports mild  SOB, denies orthopnea, DOE,N,V,D, fever, chills, malaise, abdominal pain, flank pain, no headache, dizziness, vertigo, no reported falls. No recent sick contacts.   Patient has history of missed shortened HD treatments, signs off early D/T cramping. Has not been getting to EDW regardless of whether or not he stays full treatments. The closest he has came to  Reaching current EDW of 82.5 kg has  has been 83.4-84 kg.    Past Medical History:  Diagnosis Date  . AICD (automatic cardioverter/defibrillator) present   . Cardiac arrest due to underlying cardiac condition ([place])   . CHF (congestive heart failure) ([place])   . CKD (chronic kidney disease), stage  III   . Edema   . ESRD on dialysis ([place])   . History of blood transfusion 1950's   "related to pinal menigitis; HAD 16 OPERATIONS TOTAL"  . Hyperlipemia   . Hypertension   . Ileus ([place]) 05/2015  . Myocardial infarction ([place]) 03/2013  . Obesity   . Pneumonia 2016  . Scabies infestation 06/29/2014  . Shortness of breath dyspnea   . Type II diabetes mellitus ([place])    type 2  . Ventricular tachycardia [facility])    Past Surgical History:  Procedure Laterality Date  . AV FISTULA PLACEMENT Right 03/18/2015   Procedure: BRACHIOCEPHALIC ARTERIOVENOUS (AV) FISTULA CREATION;  Surgeon: Angelia Mould, MD;  Location: [place];  Service: Vascular;  Laterality: Right;  . BACK SURGERY  1950's   "for spinal menigitis; HAD 16 OPERATIONS TOTAL"  . CARDIAC CATHETERIZATION    . CHOLECYSTECTOMY N/A 06/17/2015   Procedure: LAPAROSCOPIC CHOLECYSTECTOMY;  Surgeon: Coralie Keens, MD;  Location: [place];  Service: General;  Laterality: N/A;  . CORONARY ARTERY BYPASS GRAFT  1999   cabg x4  . 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 HEAD SURGERY  1950'S   "for spinal menigitis; HAD 16 OPERATIONS TOTAL"  . IMPLANTABLE CARDIOVERTER DEFIBRILLATOR IMPLANT  03/24/2013   STJ single chamber ICD implanted by Dr Caryl Comes for secondary prevention  . IMPLANTABLE CARDIOVERTER DEFIBRILLATOR IMPLANT N/A 03/24/2013   Procedure: IMPLANTABLE CARDIOVERTER DEFIBRILLATOR IMPLANT;  Surgeon: Deboraha Sprang, MD;  Location: [facility] C[facility] LAB;  Service: Cardiovascular;  Laterality: N/A;  . INSERTION OF DIALYSIS CATHETER N/A 03/13/2015   Procedure: INSERTION  OF TUNNELED DIALYSIS CATHETER;  Surgeon: Conrad Luis Llorens Torres, MD;  Location: [place];  Service: Vascular;  Laterality: N/A;  . LEFT HEART CATHETERIZATION WITH CORONARY/GRAFT ANGIOGRAM  03/07/2013   Procedure: LEFT HEART CATHETERIZATION WITH CORONARY/GRAFT ANGIOGRAM;  Surgeon: Clent Demark, MD;  Location: [place] CATH LAB;  Service: Cardiovascular;;  . UMBILICAL HERNIA  REPAIR N/A 06/24/2015   Procedure: HERNIA REPAIR UMBILICAL ADULT;  Surgeon: Georganna Skeans, MD;  Location[facility];  Service: General;  Laterality: N/A;   Family History  Problem Relation Age of Onset  . Heart disease Mother   . Diabetes Mother    Social History:  reports that he has quit smoking. His smoking use included Cigarettes. He has a 5.00 pack-year smoking history. He has never used smokeless tobacco. He reports that he does not drink alcohol or use drugs. No Known Allergies Prior to Admission medications   Medication Sig Start Date End Date Taking? Authorizing Provider  acetaminophen (TYLENOL) 325 MG tablet Take 2 tablets (650 mg total) by mouth every 6 (six) hours as needed for mild pain or moderate pain. 07/01/15  Yes Smiley Houseman, MD  aspirin EC 81 MG tablet Take 81 mg by mouth daily.   Yes Historical Provider, MD  calcitRIOL (ROCALTROL) 1 MCG/ML solution Place 1 mL (1 mcg total) into feeding tube every Monday, Wednesday, and Friday with hemodialysis. 07/01/15  Yes Smiley Houseman, MD  calcium acetate (PHOSLO) 667 MG capsule Take 2 capsules (1,334 mg total) by mouth 3 (three) times daily with meals. 03/20/15  Yes Maryann Mikhail, DO  calcium carbonate (TUMS EX) 750 MG chewable tablet Chew 2 tablets by mouth daily as needed for heartburn (stomach pain).   Yes Historical Provider, MD  cinacalcet (SENSIPAR) 30 MG tablet Take 30 mg by mouth daily with supper.   Yes Historical Provider, MD  glipiZIDE (GLUCOTROL) 10 MG tablet Take 10 mg by mouth daily. 02/15/15  Yes Historical Provider, MD  multivitamin (RENA-VIT) TABS tablet Take 1 tablet by mouth at bedtime. 07/01/15  Yes Smiley Houseman, MD  senna (SENOKOT) 8.6 MG TABS tablet Take 1 tablet (8.6 mg total) by mouth 2 (two) times daily. 07/01/15  Yes Smiley Houseman, MD  tamsulosin (FLOMAX) 0.4 MG CAPS capsule Take 1 capsule (0.4 mg total) by mouth daily. 07/01/15  Yes Smiley Houseman, MD  amLODipine (NORVASC) 10 MG tablet Take 1  tablet (10 mg total) by mouth daily. Patient not taking: Reported on 10/01/2015 08/08/14   Theodis Blaze, MD  carvedilol (COREG) 6.25 MG tablet Take 1 tablet (6.25 mg total) by mouth 2 (two) times daily with a meal. Patient not taking: Reported on 10/01/2015 07/01/15   Smiley Houseman, MD   Current Facility-Administered Medications  Medication Dose Route Frequency Provider Last Rate Last Dose  . 0.9 %  sodium chloride infusion  100 mL Intravenous PRN Valentina Gu, NP      . 0.9 %  sodium chloride infusion  100 mL Intravenous PRN Valentina Gu, NP      . acetaminophen (TYLENOL) tablet 650 mg  650 mg Oral Q6H PRN Ivor Costa, MD      . ALPRAZolam Duanne Moron) tablet 0.25 mg  0.25 mg Oral BID PRN Ivor Costa, MD   0.25 mg at 10/01/15 2205  . alteplase (CATHFLO ACTIVASE) injection 2 mg  2 mg Intracatheter Once PRN Valentina Gu, NP      . amLODipine (NORVASC) tablet 5 mg  5 mg Oral Daily [place]  Blaine Hamper, MD   5 mg at 10/01/15 2204  . aspirin EC tablet 325 mg  325 mg Oral Daily Ivor Costa, MD      . atorvastatin (LIPITOR) tablet 40 mg  40 mg Oral q1800 Ivor Costa, MD      . Derrill Memo ON 10/04/2015] calcitRIOL (ROCALTROL) 1 MCG/ML solution 1 mcg  1 mcg Per Tube Q M,W,F Ivor Costa, MD      . calcium acetate (PHOSLO) capsule 1,334 mg  1,334 mg Oral TID WC Ivor Costa, MD      . calcium carbonate (TUMS - dosed in mg elemental calcium) chewable tablet 1,500 mg  1,500 mg Oral Daily PRN Ivor Costa, MD      . carvedilol (COREG) tablet 3.125 mg  3.125 mg Oral BID WC Ivor Costa, MD   3.125 mg at 10/01/15 2207  . cinacalcet (SENSIPAR) tablet 30 mg  30 mg Oral Q supper Ivor Costa, MD      . heparin injection 1,000 Units  1,000 Units Dialysis PRN Valentina Gu, NP      . hydrALAZINE (APRESOLINE) injection 5 mg  5 mg Intravenous Q2H PRN Ivor Costa, MD      . HYDROmorphone (DILAUDID) injection 1 mg  1 mg Intravenous Q4H PRN Ivor Costa, MD   1 mg at 10/02/15 0859  . insulin aspart (novoLOG) injection 0-5 Units  0-5 Units  Subcutaneous QHS Ivor Costa, MD      . insulin aspart (novoLOG) injection 0-9 Units  0-9 Units Subcutaneous TID WC Ivor Costa, MD      . lidocaine (PF) (XYLOCAINE) 1 % injection 5 mL  5 mL Intradermal PRN Valentina Gu, NP      . lidocaine-prilocaine (EMLA) cream 1 application  1 application Topical PRN Valentina Gu, NP      . multivitamin (RENA-VIT) tablet 1 tablet  1 tablet Oral QHS Ivor Costa, MD   1 tablet at 10/01/15 2205  . nitroGLYCERIN (NITROSTAT) SL tablet 0.4 mg  0.4 mg Sublingual Q5 min PRN Ivor Costa, MD      . ondansetron [facility]) injection 4 mg  4 mg Intravenous Q6H PRN Ivor Costa, MD   4 mg at 10/01/15 2207  . pentafluoroprop-tetrafluoroeth (GEBAUERS) aerosol 1 application  1 application Topical PRN Valentina Gu, NP      . senna (SENOKOT) tablet 8.6 mg  1 tablet Oral BID Ivor Costa, MD      . tamsulosin (FLOMAX) capsule 0.4 mg  0.4 mg Oral Daily Ivor Costa, MD       Labs: Basic Metabolic Panel:  Recent Labs Lab 10/01/15 1424  NA 135  K 4.4  CL 99*  CO2 24  GLUCOSE 130*  BUN 66*  CREATININE 9.14*  CALCIUM 8.6*   Liver Function Tests: No results for input(s): AST, ALT, ALKPHOS, BILITOT, PROT, ALBUMIN in the last 168 hours. No results for input(s): LIPASE, AMYLASE in the last 168 hours. No results for input(s): AMMONIA in the last 168 hours. CBC:  Recent Labs Lab 10/01/15 1424  WBC 6.8  HGB 9.9*  HCT 31.3*  MCV 96.0  PLT 179   Cardiac Enzymes:  Recent Labs Lab 10/01/15 2059 10/02/15 0253  TROPONINI 0.03* 0.03*   CBG:  Recent Labs Lab 10/01/15 2238 10/02/15 0735  GLUCAP 143* 76   Iron Studies: No results for input(s): IRON, TIBC, TRANSFERRIN, FERRITIN in the last 72 hours. Studies/Results: Dg Chest Portable 1 View  Result Date: 10/01/2015 CLINICAL DATA:  Chest pain and  shortness of breath. Chronic renal failure. EXAM: PORTABLE CHEST 1 VIEW COMPARISON:  Jun 14, 2015 FINDINGS: There is no edema or consolidation. Heart is borderline  enlarged with pulmonary vascularity within normal limits. Pacemaker lead tip is attached to the right ventricle. No pneumothorax. No adenopathy. No bone lesions. IMPRESSION: No edema or consolidation. Stable cardiac prominence. No change in pacemaker lead position. No pneumothorax. Electronically Signed   By: Lowella Grip III M.D.   On: 10/01/2015 14:59   Ct Angio Chest/abd/pel For Dissection W And/or Wo Contrast  Result Date: 10/01/2015 CLINICAL DATA:  Chest pain, assess for dissection. History of pneumonia, CHF, hypertension, end-stage renal disease on dialysis, small bowel obstruction, diabetes, cirrhosis, pancreatitis. EXAM: CT ANGIOGRAPHY CHEST, ABDOMEN AND PELVIS TECHNIQUE: Multidetector CT imaging through the chest, abdomen and pelvis was performed using the standard protocol during bolus administration of intravenous contrast. Multiplanar reconstructed images and MIPs were obtained and reviewed to evaluate the vascular anatomy. CONTRAST:  100 cc Isovue 370 COMPARISON:  Chest radiograph July 31, 2015 at 1444 hours and CT chest March 18, 2015 and CT abdomen and pelvis June 24, 2015 FINDINGS: CTA CHEST FINDINGS ARTERIES: Thoracic aorta is normal course and caliber. No intrinsic density on noncontrast CT. Homogeneous contrast opacification of thoracic aorta without dissection, aneurysm, luminal irregularity, periaortic fluid collections, or contrast extravasation. Mild calcific atherosclerosis. MEDIASTINUM: The heart is moderately enlarged unchanged. Moderate coronary artery calcifications. No pericardial effusions. Cardiac pacemaker with lead tips in RIGHT ventricle. LUNGS: Focal soft tissue RIGHT trachea, as this is new from prior CT, disc most compatible with aspiration. Lingula calcified granuloma. 5 mm sub solid RIGHT middle lobe subpleural pulmonary nodule was 9 mm. Dependent atelectasis. No pleural effusion or focal consolidation, improved from prior CT. No central pulmonary embolus. SOFT TISSUES  AND OSSEOUS STRUCTURES: Focal sclerosis RIGHT humeral head, possible infarct or old trauma. Status post median sternotomy. Review of the MIP images confirms the above findings. CTA ABDOMEN AND PELVIS FINDINGS ARTERIES: Abdominal aorta is normal course and caliber. Homogeneous contrast opacification of aortoiliac vessels without dissection, aneurysm, luminal irregularity, periaortic fluid collections, or contrast extravasation. Moderate calcific atherosclerosis. Severe stenosis celiac access origin. Widely patent superior mesenteric artery origin. Widely patent RIGHT renal artery origin, no demonstrable LEFT renal artery. Moderate stenosis inferior mesenteric artery origin, which is patent. Tandem stenoses bilateral internal iliac artery branches. SOLID ORGANS: The pancreas and adrenal glands are unremarkable. Nodular liver. No hyper enhancing masses. Status post cholecystectomy. Mild splenomegaly. GASTROINTESTINAL TRACT: The stomach, small and large bowel are normal in course and caliber without inflammatory changes. Moderate colonic diverticulosis. Normal appendix. KIDNEYS/ URINARY TRACT: Severely atrophic LEFT kidney. RIGHT kidney is orthotopic, without hydronephrosis or definite renal mass is. Limited assessment for nonobstructing nephrolithiasis due to bolus timing. PERITONEUM/RETROPERITONEUM: No intraperitoneal free fluid nor free air. Aortoiliac vessels are normal in course and caliber. Similar mild porta hepatis and portacaval lymphadenopathy is likely reactive. Internal reproductive organs are unremarkable. SOFT TISSUE/OSSEOUS STRUCTURES: Nonsuspicious. Status post L2-3 laminectomies. Review of the MIP images confirms the above findings. IMPRESSION: CTA CHEST: No acute vascular process, no dissection. Stable cardiomegaly. Tracheal aspirate. Improved aeration lungs.  Dependent atelectasis. CTA ABDOMEN AND PELVIS: No acute vascular process, no dissection. Severe stenosis celiac axis origin. Moderate stenosis  IMA origin. SMA widely patent. No acute intra-abdominal or pelvic process. Cirrhosis without liver failure.  Mild splenomegaly. Electronically Signed   By: Elon Alas M.D.   On: 10/01/2015 19:49    ROS: As per HPI otherwise negative.  Physical Exam: Vitals:   10/02/15 0045 10/02/15 0153 10/02/15 0526 10/02/15 0733  BP: 122/75 (!) 151/81 129/68 122/70  Pulse: 77 78 81 76  Resp: 16 16 19 18   Temp:  98.7 F (37.1 C) 97.8 F (36.6 C) 97.8 F (36.6 C)  TempSrc:  Oral Oral Oral  SpO2: 96% 99% 98% 97%  Weight:  87.2 kg (192 lb 3.2 oz) 86.3 kg (190 lb 4.8 oz)   Height:  5\' 5"  (1.651 m)       General: Well developed, well nourished, in no acute distress. Head: Normocephalic, atraumatic, sclera non-icteric, mucus membranes are moist Neck: Supple. JVD not elevated. Lungs: Clear bilaterally to auscultation without wheezes, rales, or rhonchi. Breathing is unlabored. Heart: RRR with S1 S2. 1/6 systolic M.  Abdomen: Soft, non-tender, non-distended with normoactive bowel sounds. No rebound/guarding. No obvious abdominal masses. M-S:  Strength and tone appear normal for age. Lower extremities:without edema or ischemic changes, no open wounds  Neuro: Alert and oriented X 3. Moves all extremities spontaneously. Psych:  Responds to questions appropriately with a normal affect. Dialysis Access: LUA AVF cannulated at present 16g needles. Ecchymosis present.   Dialysis Orders: MWF [facility] 4 hours 15 minutes 400/800 82.5 kg 2.0K/2.25 Ca  Heparin 7000 units IV q treatment Mircera 100 mcg IV q 2 weeks (last dose 09/29/15 last in-center HGB 9.9 09/29/15) Calcitriol 1.5 mcg PO q tx (Last PTH 458 09/01/15) Calcium Acetate binders 1334 mg PO TIC AC (last phos 5.8 Ca 8.2 C Ca 8..4 09/01/15) Sensipar 30 mg PO q PM   Assessment/Plan: 1. Chest Pain/H/O CAD: per primary, cardiology consulted. Troponin flat trend 0.03. 2.  ESRD -  MWF @ [place]. HD today off schedule. On heparin drip-hold heparin.  3.   Hypertension/volume  - No antihypertensive medications on OP med list from HD center, however he has amlodipine and coreg ordered now. SBP fell to 79-pt is asymptomatic. Hold coreg prior to HD change amlodipine to q hs as not to interfere with volume removal. UFG currently 2000. Will raise EDW to 83.5 kg.  4.  Anemia  -HGB pending. Recent ESA dose. Follow HGB 5.  Metabolic bone disease - Continue binders/VDRA/sensipar.  6.  Nutrition - Renal Carb mod diet. Nepro/renal vit 7.  DM: per primary 8. H/O systoic HF: per primary  Rita H. Owens Shark, NP-C 10/02/2015, 10:40 AM  [employer] [phone]  Pt seen, examined, agree w assess/plan as above with additions as indicated.  Kelly Splinter MD [employer] pager [phone]    cell [phone] 10/02/2015, 2:14 PM

## 2015-10-02 NOTE — Plan of Care (Signed)
Problem: Fluid Volume: Goal: Ability to maintain a balanced intake and output will improve Pt NPO at this time until seen by cardiologist. No output as of yet d/t ESRD.   Problem: Phase I Progression Outcomes Goal: Anginal pain relieved Outcome: Progressing Nitro gtt is helping control pt's chest pressure.

## 2015-10-02 NOTE — Consult Note (Signed)
CARDIOLOGY CONSULT NOTE  Patient ID: Jahson Emanuele MRN: [id number] DOB/AGE: [birth year] 60 y.o.  Admit date: 10/01/2015 Referring Physician  Ivor Costa, [facility] Primary Physician:  [name], NP Reason for Consultation  Chest pain and CAD  HPI: Nai Dasch  is a 60 y.o. male  With  chronic diastolic heart failure, known coronary artery disease and CABG, this was done in 1997/04/23. His last coronary angiography was February 2015 revealing diffusely diseased distal LAD, severely diffusely diseased RCA with patent LIMA to LAD and SVG to OM1. RCA is not amenable for angioplasty.   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 He was at the dialysis center when he complained of severe right-sided sharp to heavy chest pain in the right upper part of the chest, hence was sent over to the emergency room for evaluation. Chest pain described as sharp to heavy pain, persistent and present even today during my examination.  Patient has had Gro[place]ong catheter removed 3 days ago from the right subclavian vein, now undergoing hemodialysis through mature right AV fistula. Patient has history of sudden cardiac death and history of ICD implantation for secondary prevention of sudden cardiac death, in 23-Apr-2013, however his ejection fraction is improved to low normal.  Past Medical History:  Diagnosis Date  . AICD (automatic cardioverter/defibrillator) present   . Cardiac arrest due to underlying cardiac condition ([place])   . CHF (congestive heart failure) ([place])   . CKD (chronic kidney disease), stage III   . Edema   . ESRD on dialysis ([place])   . History of blood transfusion 1950's   "related to pinal menigitis; HAD 16 OPERATIONS TOTAL"  . Hyperlipemia   . Hypertension   . Ileus ([place]) 05/2015  . Myocardial infarction ([place]) 2013/04/23  . Obesity   . Pneumonia April 24, 2014  . Scabies infestation 06/29/2014  . Shortness of breath dyspnea    . Type II diabetes mellitus ([place])    type 2  . Ventricular tachycardia [facility])      Past Surgical History:  Procedure Laterality Date  . AV FISTULA PLACEMENT Right 03/18/2015   Procedure: BRACHIOCEPHALIC ARTERIOVENOUS (AV) FISTULA CREATION;  Surgeon: Angelia Mould, MD;  Location: [place];  Service: Vascular;  Laterality: Right;  . BACK SURGERY  1950's   "for spinal menigitis; HAD 16 OPERATIONS TOTAL"  . CARDIAC CATHETERIZATION    . CHOLECYSTECTOMY N/A 06/17/2015   Procedure: LAPAROSCOPIC CHOLECYSTECTOMY;  Surgeon: Coralie Keens, MD;  Location: [place];  Service: General;  Laterality: N/A;  . CORONARY ARTERY BYPASS GRAFT  1999   cabg x4  . 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 HEAD SURGERY  1950'S   "for spinal menigitis; HAD 16 OPERATIONS TOTAL"  . IMPLANTABLE CARDIOVERTER DEFIBRILLATOR IMPLANT  03/24/2013   STJ single chamber ICD implanted by Dr Caryl Comes for secondary prevention  . IMPLANTABLE CARDIOVERTER DEFIBRILLATOR IMPLANT N/A 03/24/2013   Procedure: IMPLANTABLE CARDIOVERTER DEFIBRILLATOR IMPLANT;  Surgeon: Deboraha Sprang, MD;  Location: [facility] CATH LAB;  Service: Cardiovascular;  Laterality: N/A;  . INSERTION OF DIALYSIS CATHETER N/A 03/13/2015   Procedure: INSERTION OF TUNNELED DIALYSIS CATHETER;  Surgeon: Conrad Collins, MD;  Location: [place];  Service: Vascular;  Laterality: N/A;  . LEFT HEART CATHETERIZATION WITH CORONARY/GRAFT ANGIOGRAM  03/07/2013   Procedure: LEFT HEART CATHETERIZATION WITH CORONARY/GRAFT ANGIOGRAM;  Surgeon: Clent Demark, MD;  Location: [place] CATH LAB;  Service: Cardiovascular;;  .  UMBILICAL HERNIA REPAIR N/A 06/24/2015   Procedure: HERNIA REPAIR UMBILICAL ADULT;  Surgeon: Georganna Skeans, MD;  Location: [facility] OR;  Service: General;  Laterality: N/A;     Family History  Problem Relation Age of Onset  . Heart disease Mother   . Diabetes Mother      Social History: Social History   Social History  . Marital status: Married     Spouse name: N/A  . Number of children: N/A  . Years of education: N/A   Occupational History  . Not on file.   Social History Main Topics  . Smoking status: Former Smoker    Packs/day: 1.00    Years: 5.00    Types: Cigarettes  . Smokeless tobacco: Never Used     Comment: "quit smoking in the 1960's"  . Alcohol use No  . Drug use: No  . Sexual activity: Not Currently   Other Topics Concern  . Not on file   Social History Narrative  . No narrative on file     Prescriptions Prior to Admission  Medication Sig Dispense Refill Last Dose  . acetaminophen (TYLENOL) 325 MG tablet Take 2 tablets (650 mg total) by mouth every 6 (six) hours as needed for mild pain or moderate pain. 120 tablet 0 Past Week at Unknown time  . aspirin EC 81 MG tablet Take 81 mg by mouth daily.   09/30/2015 at Unknown time  . calcitRIOL (ROCALTROL) 1 MCG/ML solution Place 1 mL (1 mcg total) into feeding tube every Monday, Wednesday, and Friday with hemodialysis. 15 mL 12 Past Month at Unknown time  . calcium acetate (PHOSLO) 667 MG capsule Take 2 capsules (1,334 mg total) by mouth 3 (three) times daily with meals. 90 capsule 0 09/30/2015 at Unknown time  . calcium carbonate (TUMS EX) 750 MG chewable tablet Chew 2 tablets by mouth daily as needed for heartburn (stomach pain).   Past Week at Unknown time  . cinacalcet (SENSIPAR) 30 MG tablet Take 30 mg by mouth daily with supper.   09/30/2015 at Unknown time  . glipiZIDE (GLUCOTROL) 10 MG tablet Take 10 mg by mouth daily.  1 09/30/2015 at Unknown time  . multivitamin (RENA-VIT) TABS tablet Take 1 tablet by mouth at bedtime. 30 tablet 0 09/30/2015 at Unknown time  . senna (SENOKOT) 8.6 MG TABS tablet Take 1 tablet (8.6 mg total) by mouth 2 (two) times daily. 30 each 0 Past Week at Unknown time  . tamsulosin (FLOMAX) 0.4 MG CAPS capsule Take 1 capsule (0.4 mg total) by mouth daily. 30 capsule 0 09/30/2015 at Unknown time  . amLODipine (NORVASC) 10 MG tablet Take 1 tablet  (10 mg total) by mouth daily. (Patient not taking: Reported on 10/01/2015) 3 tablet 0 Not Taking at Unknown time  . carvedilol (COREG) 6.25 MG tablet Take 1 tablet (6.25 mg total) by mouth 2 (two) times daily with a meal. (Patient not taking: Reported on 10/01/2015) 60 tablet 0 Not Taking at 8a   ROS: General: no fevers/chills/night sweats Eyes: no blurry vision, diplopia, or amaurosis ENT: no sore throat or hearing loss Resp: no cough, wheezing, or hemoptysis CV: no edema or palpitations GI: no abdominal pain, nausea, vomiting, diarrhea, or constipation GU: no dysuria, frequency, or hematuria Skin: no rash Neuro: no headache, numbness, tingling, or weakness of extremities Musculoskeletal: no joint pain or swelling Heme: no bleeding, DVT, or easy bruising Endo: no polydipsia or polyuria   Physical Exam: Blood pressure 122/70, pulse 76, temperature 97.8 F (  36.6 C), temperature source Oral, resp. rate 18, height 5\' 5"  (1.651 m), weight 86.3 kg (190 lb 4.8 oz), SpO2 97 %.   General appearance: alert, cooperative, appears older than stated age and no distress Lungs: clear to auscultation bilaterally Chest wall: right sided chest wall tenderness Heart: regular rate and rhythm, S1, S2 normal, no murmur, click, rub or gallop Abdomen: soft, non-tender; bowel sounds normal; no masses,  no organomegaly Extremities: extremities normal, atraumatic, no cyanosis or edema Pulses: Right axillary AV fistula noted. Bilateral femoral pulses 2+ with faint bruit. Popliteal pulse bilaterally faint. Pedal pulses could not be felt. Warm lower extra duties, capillary refill normal. Neurologic: Grossly normal  Labs:   Lab Results  Component Value Date   WBC 6.8 10/01/2015   HGB 9.9 (L) 10/01/2015   HCT 31.3 (L) 10/01/2015   MCV 96.0 10/01/2015   PLT 179 10/01/2015    Recent Labs Lab 10/01/15 1424  NA 135  K 4.4  CL 99*  CO2 24  BUN 66*  CREATININE 9.14*  CALCIUM 8.6*  GLUCOSE 130*    Lipid  Panel     Component Value Date/Time   CHOL 115 10/02/2015 0253   TRIG 91 10/02/2015 0253   HDL 23 (L) 10/02/2015 0253   CHOLHDL 5.0 10/02/2015 0253   VLDL 18 10/02/2015 0253   LDLCALC 74 10/02/2015 0253    BNP (last 3 results)  Recent Labs  03/10/15 1444 06/14/15 1355  BNP 1,086.9* 75.2    HEMOGLOBIN A1C Lab Results  Component Value Date   HGBA1C 5.9 (H) 03/12/2015   MPG 123 03/12/2015    Cardiac Panel (last 3 results)  Recent Labs  06/15/15 0017 10/01/15 2059 10/02/15 0253  TROPONINI 0.04* 0.03* 0.03*   EKG :  Sinus rhythm at a rate of 74bpm, normal axis, left atrial abnormality, PRWP, nonspecific T-wave abnormality in lateral leads, cannot exclude anterolateral infarct, old. No significant change from prior EKG.   Echocardiogram 03/11/2015: Mildly reduced LVEF at 45-50%. Inferolateral hypokinesis. Mitral annular calcification. Moderate to severe posteriorly directed MR, consider papillary muscle dysfunction. Moderate pulmonary hypertension, PA pressure 47 mmHg. Compared to 07/01/2014, no significant change.  Radiology: Dg Chest Portable 1 View  Result Date: 10/01/2015 CLINICAL DATA:  Chest pain and shortness of breath. Chronic renal failure. EXAM: PORTABLE CHEST 1 VIEW COMPARISON:  Jun 14, 2015 FINDINGS: There is no edema or consolidation. Heart is borderline enlarged with pulmonary vascularity within normal limits. Pacemaker lead tip is attached to the right ventricle. No pneumothorax. No adenopathy. No bone lesions. IMPRESSION: No edema or consolidation. Stable cardiac prominence. No change in pacemaker lead position. No pneumothorax. Electronically Signed   By: Lowella Grip III M.D.   On: 10/01/2015 14:59   Ct Angio Chest/abd/pel For Dissection W And/or Wo Contrast  Result Date: 10/01/2015 CLINICAL DATA:  Chest pain, assess for dissection. History of pneumonia, CHF, hypertension, end-stage renal disease on dialysis, small bowel obstruction, diabetes, cirrhosis,  pancreatitis. EXAM: CT ANGIOGRAPHY CHEST, ABDOMEN AND PELVIS TECHNIQUE: Multidetector CT imaging through the chest, abdomen and pelvis was performed using the standard protocol during bolus administration of intravenous contrast. Multiplanar reconstructed images and MIPs were obtained and reviewed to evaluate the vascular anatomy. CONTRAST:  100 cc Isovue 370 COMPARISON:  Chest radiograph July 31, 2015 at 1444 hours and CT chest March 18, 2015 and CT abdomen and pelvis June 24, 2015 FINDINGS: CTA CHEST FINDINGS ARTERIES: Thoracic aorta is normal course and caliber. No intrinsic density on noncontrast CT. Homogeneous contrast  opacification of thoracic aorta without dissection, aneurysm, luminal irregularity, periaortic fluid collections, or contrast extravasation. Mild calcific atherosclerosis. MEDIASTINUM: The heart is moderately enlarged unchanged. Moderate coronary artery calcifications. No pericardial effusions. Cardiac pacemaker with lead tips in RIGHT ventricle. LUNGS: Focal soft tissue RIGHT trachea, as this is new from prior CT, disc most compatible with aspiration. Lingula calcified granuloma. 5 mm sub solid RIGHT middle lobe subpleural pulmonary nodule was 9 mm. Dependent atelectasis. No pleural effusion or focal consolidation, improved from prior CT. No central pulmonary embolus. SOFT TISSUES AND OSSEOUS STRUCTURES: Focal sclerosis RIGHT humeral head, possible infarct or old trauma. Status post median sternotomy. Review of the MIP images confirms the above findings. CTA ABDOMEN AND PELVIS FINDINGS ARTERIES: Abdominal aorta is normal course and caliber. Homogeneous contrast opacification of aortoiliac vessels without dissection, aneurysm, luminal irregularity, periaortic fluid collections, or contrast extravasation. Moderate calcific atherosclerosis. Severe stenosis celiac access origin. Widely patent superior mesenteric artery origin. Widely patent RIGHT renal artery origin, no demonstrable LEFT renal  artery. Moderate stenosis inferior mesenteric artery origin, which is patent. Tandem stenoses bilateral internal iliac artery branches. SOLID ORGANS: The pancreas and adrenal glands are unremarkable. Nodular liver. No hyper enhancing masses. Status post cholecystectomy. Mild splenomegaly. GASTROINTESTINAL TRACT: The stomach, small and large bowel are normal in course and caliber without inflammatory changes. Moderate colonic diverticulosis. Normal appendix. KIDNEYS/ URINARY TRACT: Severely atrophic LEFT kidney. RIGHT kidney is orthotopic, without hydronephrosis or definite renal mass is. Limited assessment for nonobstructing nephrolithiasis due to bolus timing. PERITONEUM/RETROPERITONEUM: No intraperitoneal free fluid nor free air. Aortoiliac vessels are normal in course and caliber. Similar mild porta hepatis and portacaval lymphadenopathy is likely reactive. Internal reproductive organs are unremarkable. SOFT TISSUE/OSSEOUS STRUCTURES: Nonsuspicious. Status post L2-3 laminectomies. Review of the MIP images confirms the above findings. IMPRESSION: CTA CHEST: No acute vascular process, no dissection. Stable cardiomegaly. Tracheal aspirate. Improved aeration lungs.  Dependent atelectasis. CTA ABDOMEN AND PELVIS: No acute vascular process, no dissection. Severe stenosis celiac axis origin. Moderate stenosis IMA origin. SMA widely patent. No acute intra-abdominal or pelvic process. Cirrhosis without liver failure.  Mild splenomegaly. Electronically Signed   By: Elon Alas M.D.   On: 10/01/2015 19:49    Scheduled Meds: . amLODipine  5 mg Oral Daily  . aspirin EC  325 mg Oral Daily  . atorvastatin  40 mg Oral q1800  . [START ON 10/04/2015] calcitRIOL  1 mcg Per Tube Q M,W,F  . calcium acetate  1,334 mg Oral TID WC  . carvedilol  3.125 mg Oral BID WC  . cinacalcet  30 mg Oral Q supper  . insulin aspart  0-5 Units Subcutaneous QHS  . insulin aspart  0-9 Units Subcutaneous TID WC  . multivitamin  1 tablet  Oral QHS  . senna  1 tablet Oral BID  . tamsulosin  0.4 mg Oral Daily   Continuous Infusions:  PRN Meds:.sodium chloride, sodium chloride, acetaminophen, ALPRAZolam, alteplase, calcium carbonate, heparin, hydrALAZINE, HYDROmorphone (DILAUDID) injection, lidocaine (PF), lidocaine-prilocaine, nitroGLYCERIN, ondansetron (ZOFRAN) IV, pentafluoroprop-tetrafluoroeth  ASSESSMENT AND PLAN:  1. Chest pain clearly muscular skeletal and easily reproducible. Do not suspect ACS. Serum troponin flat, probably related to chronic diastolic heart failure and also chronic renal failure.  History of Atherosclerosis of native coronary artery of native heart without angina pectoris  CABG 1999: Coronary angio 02/24/2013: Diffuse distal LAD disease, RCA stenosis not felt to be amenable for PCI, patent LIMA to LAD and SVG to OM1.   2. Acute on Chronic diastolic heart failure,  BNP also elevated today and continue hemodialysis. No acute distress. Discussed diet with the patient.    3. Benign essential hypertension   4. Controlled type 2 diabetes mellitus with complication, without long-term current use of insulin   5. Acute on CKD (chronic kidney disease), stage 4 to 5 (severe)    6. AICD (automatic cardioverter/defibrillator) present, placed for history of sudden cardiac death in 2013/04/24 and low EF, now EF low normal. 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 Physician: Dr [name].  7. Chronic hepatitis C, history of cirrhosis of the liver.  Recommendation: I have discontinued IV heparin and nothing by mouth status. IV nitroglycerin was also discontinued. The right upper chest discomfort could also be related to recent removal of temporary dialysis catheter from the right subclavian vein 3-4 days ago. No further recommendation from cardiac standpoint. He'll follow-up with me as usual in the outpatient basis.  Adrian Prows, MD 10/02/2015, 11:02 AM [facility]. [place] Pager:  [phone] Office: [phone] If no answer Cell [phone]

## 2015-10-02 NOTE — Progress Notes (Addendum)
ANTICOAGULATION CONSULT NOTE - Initial Consult  Pharmacy Consult for Heparin Indication: chest pain/ACS  No Known Allergies  Patient Measurements: Height: 5\' 5"  (165.1 cm) Weight: 190 lb 4.8 oz (86.3 kg) IBW/kg (Calculated) : 61.5 Heparin Dosing Weight: 81.5 kg  Vital Signs: Temp: 97.8 F (36.6 C) (09/09 0526) Temp Source: Oral (09/09 0526) BP: 129/68 (09/09 0526) Pulse Rate: 81 (09/09 0526)  Labs:  Recent Labs  10/01/15 1424 10/01/15 2059 10/02/15 0253 10/02/15 0641  HGB 9.9*  --   --   --   HCT 31.3*  --   --   --   PLT 179  --   --   --   HEPARINUNFRC  --   --   --  0.37  CREATININE 9.14*  --   --   --   TROPONINI  --  0.03* 0.03*  --     Estimated Creatinine Clearance: 8.6 mL/min (by C-G formula based on SCr of 9.14 mg/dL).   Medical History: Past Medical History:  Diagnosis Date  . AICD (automatic cardioverter/defibrillator) present   . Cardiac arrest due to underlying cardiac condition ([place])   . CHF (congestive heart failure) ([place])   . CKD (chronic kidney disease), stage III   . Edema   . ESRD on dialysis ([place])   . History of blood transfusion 1950's   "related to pinal menigitis; HAD 16 OPERATIONS TOTAL"  . Hyperlipemia   . Hypertension   . Ileus ([place]) 05/2015  . Myocardial infarction ([place]) 03/2013  . Obesity   . Pneumonia 2016  . Scabies infestation 06/29/2014  . Shortness of breath dyspnea   . Type II diabetes mellitus ([place])    type 2  . Ventricular tachycardia ([facility])     Medications:  Prescriptions Prior to Admission  Medication Sig Dispense Refill Last Dose  . acetaminophen (TYLENOL) 325 MG tablet Take 2 tablets (650 mg total) by mouth every 6 (six) hours as needed for mild pain or moderate pain. 120 tablet 0 Past Week at Unknown time  . aspirin EC 81 MG tablet Take 81 mg by mouth daily.   09/30/2015 at Unknown time  . calcitRIOL (ROCALTROL) 1 MCG/ML solution Place 1 mL (1 mcg total) into feeding tube every Monday, Wednesday, and Friday with  hemodialysis. 15 mL 12 Past Month at Unknown time  . calcium acetate (PHOSLO) 667 MG capsule Take 2 capsules (1,334 mg total) by mouth 3 (three) times daily with meals. 90 capsule 0 09/30/2015 at Unknown time  . calcium carbonate (TUMS EX) 750 MG chewable tablet Chew 2 tablets by mouth daily as needed for heartburn (stomach pain).   Past Week at Unknown time  . cinacalcet (SENSIPAR) 30 MG tablet Take 30 mg by mouth daily with supper.   09/30/2015 at Unknown time  . glipiZIDE (GLUCOTROL) 10 MG tablet Take 10 mg by mouth daily.  1 09/30/2015 at Unknown time  . multivitamin (RENA-VIT) TABS tablet Take 1 tablet by mouth at bedtime. 30 tablet 0 09/30/2015 at Unknown time  . senna (SENOKOT) 8.6 MG TABS tablet Take 1 tablet (8.6 mg total) by mouth 2 (two) times daily. 30 each 0 Past Week at Unknown time  . tamsulosin (FLOMAX) 0.4 MG CAPS capsule Take 1 capsule (0.4 mg total) by mouth daily. 30 capsule 0 09/30/2015 at Unknown time  . amLODipine (NORVASC) 10 MG tablet Take 1 tablet (10 mg total) by mouth daily. (Patient not taking: Reported on 10/01/2015) 3 tablet 0 Not Taking at Unknown time  .  carvedilol (COREG) 6.25 MG tablet Take 1 tablet (6.25 mg total) by mouth 2 (two) times daily with a meal. (Patient not taking: Reported on 10/01/2015) 60 tablet 0 Not Taking at 8a   Scheduled:  . amLODipine  5 mg Oral Daily  . aspirin EC  325 mg Oral Daily  . atorvastatin  40 mg Oral q1800  . [START ON 10/04/2015] calcitRIOL  1 mcg Per Tube Q M,W,F  . calcium acetate  1,334 mg Oral TID WC  . carvedilol  3.125 mg Oral BID WC  . cinacalcet  30 mg Oral Q supper  . insulin aspart  0-5 Units Subcutaneous QHS  . insulin aspart  0-9 Units Subcutaneous TID WC  . multivitamin  1 tablet Oral QHS  . senna  1 tablet Oral BID  . tamsulosin  0.4 mg Oral Daily   Infusions:  . heparin 1,100 Units/hr (10/01/15 2215)  . nitroGLYCERIN 5 mcg/min (10/02/15 0059)    Assessment: 60yo male with history of ESRD on HD presents with CP during  HD 9/8. Pharmacy is consulted to dose heparin for ACS/chest pain. Troponin 0.03>0.03>0.04 HL therapeutic PLTC stable, hgb low stable  Goal of Therapy:  Heparin level 0.3-0.7 units/ml Monitor platelets by anticoagulation protocol: Yes   Plan:  Continue Heparin 1100 units/hr -Confirmatory 8hr heparin level -Daily heparin level, CBC -Monitor s/sx bleeding, h/h, PLTC  Carlean Jews, Pharm.D. PGY1 Pharmacy Resident 9/9/20177:42 AM Pager [phone]

## 2015-10-02 NOTE — Progress Notes (Signed)
States relief of Chest Pressure with Dilaudid given earlier. Dr. Einar Gip aware. NTG gtt and Heparin gtt d/c'd. Plan HD this morning. Verbalized understanding.

## 2015-10-02 NOTE — Consult Note (Signed)
Patient wants Dr. Einar Gip as his cardiologist. Dr. Einar Gip notified. He said he will see him today.  Dr. Dixie Dials 8:45 AM.

## 2015-10-02 NOTE — Progress Notes (Signed)
[facility] PROGRESS NOTE  Eric [name] XTG:[id number] DOB: 1955/11/08 DOA: 10/01/2015 PCP: [name], NP  Interim summary and HPI 60 y.o. 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 Patient states that he was in dialysis center for scheduled dialysis today, but was not able to start dialysis since he started having chest pain. His chest pain is located in the front chest, constant, 10 out of 10 severity, pressure-like, radiating to both arms and legs. It is not aggravated or alleviated by any known factors. He has mild SOB, but no cough, fever or chills. Patient denies nausea, vomiting, abdominal pain,diarrhea symptoms of UTI or unilateral weakness.   Assessment/Plan: Chest pain: pt has hx of CAD and MI. Now presents with pressure-like persistent chest pain with mildly elevated troponin 0.03, admitted to r/o ACS and for further evaluation and treatment.  -CT angiogram of the chest and abdomen/pelvis are negative for PE or dissection. -will continue monitoring on telemetry -per cardiology no heparin or NTG drip. - troponin flat elevated and non significant in patient with ESRD - resume coreg - will continue ASA and lipitor  - 2d echo: pending  -appreciate Dr. Einar Gip assistance and recommendations.   HTN: Blood pressure 182/106 on admission. Patient's not taking home medications, amlodipine and Coreg as these medications were discontinued. -Restart Coreg low dose -low sodium diet and volume/BP control with HD  DM-II: Last A1c 5.9 on 03/12/15, well controled. Patient is taking glipizide at home -SSI while inpatient -will hold oral hypoglycemic regimen   ESRD on dialysis (MWF): could not do HD today due to chest pain. Potassium 4.4,  bicarbonate 24, creatinine 9.41, BUN 66 -Continue calcitriol, PhosLo, calcium carbonate and Sensipar -had HD today  -will follow renal service recommendations   Chronic systolic (congestive) heart failure ([place]): last 2-D echo on 03/11/15 showed an EF of 45-50%. No JVD appreciated  -currently compensated. -Volume management per renal with HD   Code Status: full  Family Communication: no family at bedside  Disposition Plan: anticipate discharge home once medically stable. Most likely home tomorrow.    Consultants:  Renal service  Cardiology   Procedures:  See below for x-ray reports   Antibiotics:  None   HPI/Subjective: Afebrile, still complaining of chest pain. No nausea, no vomiting.  Objective: Vitals:   10/02/15 1309 10/02/15 1427  BP: 97/61 128/77  Pulse: 76 84  Resp: 17 19  Temp:  98.4 F (36.9 C)    Intake/Output Summary (Last 24 hours) at 10/02/15 1530 Last data filed at 10/02/15 1309  Gross per 24 hour  Intake           295.28 ml  Output             1500 ml  Net         -1204.72 ml   Filed Weights   10/02/15 0526 10/02/15 0945 10/02/15 1309  Weight: 86.3 kg (190 lb 4.8 oz) 85.3 kg (188 lb 0.8 oz) 85 kg (187 lb 6.3 oz)    Exam:   General: afebrile, no SOB, denies nausea and vomiting. Complaining of right chest pain.  Cardiovascular: S1 and S2, no rubs, no gallops, soft systolic murmur   Respiratory: no wheezing, no crackles  Abdomen: soft, NT, ND, positive BS  Musculoskeletal: trace edema bilaterally   Neurologic exam: AAOX3, no  focal deficit    Data Reviewed: Basic Metabolic Panel:  Recent Labs Lab 10/01/15 1424 10/02/15 1032  NA 135 132*  K 4.4 4.8  CL 99* 99*  CO2 24 21*  GLUCOSE 130* 101*  BUN 66* 71*  CREATININE 9.14* 10.03*  CALCIUM 8.6* 8.4*  PHOS  --  7.1*   Liver Function Tests:  Recent Labs Lab 10/02/15 1032  ALBUMIN 3.0*   CBC:  Recent Labs Lab 10/01/15 1424 10/02/15 0859  WBC 6.8 6.4  HGB 9.9* 9.7*   HCT 31.3* 31.5*  MCV 96.0 96.0  PLT 179 210   Cardiac Enzymes:  Recent Labs Lab 10/01/15 2059 10/02/15 0253 10/02/15 0859  TROPONINI 0.03* 0.03* 0.04*   BNP (last 3 results)  Recent Labs  03/10/15 1444 06/14/15 1355  BNP 1,086.9* 75.2   CBG:  Recent Labs Lab 10/01/15 2238 10/02/15 0735 10/02/15 1430  GLUCAP 143* 76 91    Recent Results (from the past 240 hour(s))  MRSA PCR Screening     Status: None   Collection Time: 10/02/15  1:44 AM  Result Value Ref Range Status   MRSA by PCR NEGATIVE NEGATIVE Final    Comment:        The GeneXpert MRSA Assay (FDA approved for NASAL specimens only), is one component of a comprehensive MRSA colonization surveillance program. It is not intended to diagnose MRSA infection nor to guide or monitor treatment for MRSA infections.      Studies: Dg Chest Portable 1 View  Result Date: 10/01/2015 CLINICAL DATA:  Chest pain and shortness of breath. Chronic renal failure. EXAM: PORTABLE CHEST 1 VIEW COMPARISON:  Jun 14, 2015 FINDINGS: There is no edema or consolidation. Heart is borderline enlarged with pulmonary vascularity within normal limits. Pacemaker lead tip is attached to the right ventricle. No pneumothorax. No adenopathy. No bone lesions. IMPRESSION: No edema or consolidation. Stable cardiac prominence. No change in pacemaker lead position. No pneumothorax. Electronically Signed   By: Lowella Grip III M.D.   On: 10/01/2015 14:59   Ct Angio Chest/abd/pel For Dissection W And/or Wo Contrast  Result Date: 10/01/2015 CLINICAL DATA:  Chest pain, assess for dissection. History of pneumonia, CHF, hypertension, end-stage renal disease on dialysis, small bowel obstruction, diabetes, cirrhosis, pancreatitis. EXAM: CT ANGIOGRAPHY CHEST, ABDOMEN AND PELVIS TECHNIQUE: Multidetector CT imaging through the chest, abdomen and pelvis was performed using the standard protocol during bolus administration of intravenous contrast. Multiplanar  reconstructed images and MIPs were obtained and reviewed to evaluate the vascular anatomy. CONTRAST:  100 cc Isovue 370 COMPARISON:  Chest radiograph July 31, 2015 at 1444 hours and CT chest March 18, 2015 and CT abdomen and pelvis June 24, 2015 FINDINGS: CTA CHEST FINDINGS ARTERIES: Thoracic aorta is normal course and caliber. No intrinsic density on noncontrast CT. Homogeneous contrast opacification of thoracic aorta without dissection, aneurysm, luminal irregularity, periaortic fluid collections, or contrast extravasation. Mild calcific atherosclerosis. MEDIASTINUM: The heart is moderately enlarged unchanged. Moderate coronary artery calcifications. No pericardial effusions. Cardiac pacemaker with lead tips in RIGHT ventricle. LUNGS: Focal soft tissue RIGHT trachea, as this is new from prior CT, disc most compatible with aspiration. Lingula calcified granuloma. 5 mm sub solid RIGHT middle lobe subpleural pulmonary nodule was 9 mm. Dependent atelectasis. No pleural effusion or focal consolidation, improved from prior CT. No central pulmonary embolus. SOFT TISSUES AND OSSEOUS STRUCTURES: Focal sclerosis RIGHT humeral head, possible infarct or old trauma. Status post median sternotomy. Review of the MIP images confirms the above findings. CTA  ABDOMEN AND PELVIS FINDINGS ARTERIES: Abdominal aorta is normal course and caliber. Homogeneous contrast opacification of aortoiliac vessels without dissection, aneurysm, luminal irregularity, periaortic fluid collections, or contrast extravasation. Moderate calcific atherosclerosis. Severe stenosis celiac access origin. Widely patent superior mesenteric artery origin. Widely patent RIGHT renal artery origin, no demonstrable LEFT renal artery. Moderate stenosis inferior mesenteric artery origin, which is patent. Tandem stenoses bilateral internal iliac artery branches. SOLID ORGANS: The pancreas and adrenal glands are unremarkable. Nodular liver. No hyper enhancing masses.  Status post cholecystectomy. Mild splenomegaly. GASTROINTESTINAL TRACT: The stomach, small and large bowel are normal in course and caliber without inflammatory changes. Moderate colonic diverticulosis. Normal appendix. KIDNEYS/ URINARY TRACT: Severely atrophic LEFT kidney. RIGHT kidney is orthotopic, without hydronephrosis or definite renal mass is. Limited assessment for nonobstructing nephrolithiasis due to bolus timing. PERITONEUM/RETROPERITONEUM: No intraperitoneal free fluid nor free air. Aortoiliac vessels are normal in course and caliber. Similar mild porta hepatis and portacaval lymphadenopathy is likely reactive. Internal reproductive organs are unremarkable. SOFT TISSUE/OSSEOUS STRUCTURES: Nonsuspicious. Status post L2-3 laminectomies. Review of the MIP images confirms the above findings. IMPRESSION: CTA CHEST: No acute vascular process, no dissection. Stable cardiomegaly. Tracheal aspirate. Improved aeration lungs.  Dependent atelectasis. CTA ABDOMEN AND PELVIS: No acute vascular process, no dissection. Severe stenosis celiac axis origin. Moderate stenosis IMA origin. SMA widely patent. No acute intra-abdominal or pelvic process. Cirrhosis without liver failure.  Mild splenomegaly. Electronically Signed   By: Elon Alas M.D.   On: 10/01/2015 19:49    Scheduled Meds: . aspirin EC  325 mg Oral Daily  . atorvastatin  40 mg Oral q1800  . calcium acetate  1,334 mg Oral TID WC  . carvedilol  3.125 mg Oral BID WC  . cinacalcet  30 mg Oral Q supper  . doxercalciferol  1.5 mcg Oral Q M,W,F-HD  . feeding supplement (NEPRO CARB STEADY)  237 mL Oral BID BM  . insulin aspart  0-5 Units Subcutaneous QHS  . insulin aspart  0-9 Units Subcutaneous TID WC  . multivitamin  1 tablet Oral QHS  . senna  1 tablet Oral BID  . tamsulosin  0.4 mg Oral Daily   Continuous Infusions:   Principal Problem:   Pain in the chest Active Problems:   Chest pain   AICD (automatic cardioverter/defibrillator)  present   Essential hypertension, benign   Diabetes mellitus type 2, controlled ([place])   ESRD on dialysis ([place])   CAD (coronary artery disease)   Chronic systolic (congestive) heart failure ([place])    Time spent: 30 minutes    Barton Dubois  [facility] Pager [phone]. If 7PM-7AM, please contact night-coverage at [email or web address], password [facility] 10/02/2015, 3:30 PM  LOS: 1 day

## 2015-10-03 ENCOUNTER — Other Ambulatory Visit (HOSPITAL_COMMUNITY): Payer: Medicare Other

## 2015-10-03 DIAGNOSIS — N400 Enlarged prostate without lower urinary tract symptoms: Secondary | ICD-10-CM

## 2015-10-03 LAB — CBC
HEMATOCRIT: 31.2 % — AB (ref 39.0–52.0)
HEMOGLOBIN: 9.9 g/dL — AB (ref 13.0–17.0)
MCH: 30.7 pg (ref 26.0–34.0)
MCHC: 31.7 g/dL (ref 30.0–36.0)
MCV: 96.6 fL (ref 78.0–100.0)
Platelets: 186 10*3/uL (ref 150–400)
RBC: 3.23 MIL/uL — AB (ref 4.22–5.81)
RDW: 14.6 % (ref 11.5–15.5)
WBC: 7 10*3/uL (ref 4.0–10.5)

## 2015-10-03 LAB — BASIC METABOLIC PANEL
Anion gap: 11 (ref 5–15)
BUN: 48 mg/dL — ABNORMAL HIGH (ref 6–20)
CO2: 25 mmol/L (ref 22–32)
Calcium: 8.4 mg/dL — ABNORMAL LOW (ref 8.9–10.3)
Chloride: 97 mmol/L — ABNORMAL LOW (ref 101–111)
Creatinine, Ser: 8.34 mg/dL — ABNORMAL HIGH (ref 0.61–1.24)
GFR calc Af Amer: 7 mL/min — ABNORMAL LOW (ref >60)
GFR calc non Af Amer: 6 mL/min — ABNORMAL LOW (ref >60)
Glucose, Bld: 111 mg/dL — ABNORMAL HIGH (ref 65–99)
Potassium: 4.9 mmol/L (ref 3.5–5.1)
Sodium: 133 mmol/L — ABNORMAL LOW (ref 135–145)

## 2015-10-03 LAB — HEMOGLOBIN A1C
HEMOGLOBIN A1C: 5.8 % — AB (ref 4.8–5.6)
MEAN PLASMA GLUCOSE: 120 mg/dL

## 2015-10-03 LAB — GLUCOSE, CAPILLARY
GLUCOSE-CAPILLARY: 108 mg/dL — AB (ref 65–99)
Glucose-Capillary: 156 mg/dL — ABNORMAL HIGH (ref 65–99)

## 2015-10-03 MED ORDER — CARVEDILOL 6.25 MG PO TABS: 6.2500 mg | ORAL_TABLET | Freq: Two times a day (BID) | ORAL | Status: DC

## 2015-10-03 MED ORDER — ATORVASTATIN CALCIUM 40 MG PO TABS: 40.0000 mg | ORAL_TABLET | Freq: Every day | ORAL | 1 refills | Status: DC

## 2015-10-03 MED ORDER — CARVEDILOL 3.125 MG PO TABS
3.1250 mg | ORAL_TABLET | Freq: Once | ORAL | Status: AC
  Administered 2015-10-03: 3.125 mg via ORAL
  Filled 2015-10-03: qty 1

## 2015-10-03 MED ORDER — CARVEDILOL 6.25 MG PO TABS: 6.2500 mg | ORAL_TABLET | Freq: Two times a day (BID) | ORAL | 1 refills | Status: DC

## 2015-10-03 NOTE — Progress Notes (Addendum)
[facility] Progress Note   Subjective: no more CP, AVF infiltrated w some bruising, completed dialysis though  Vitals:   10/02/15 2000 10/03/15 0045 10/03/15 0415 10/03/15 0734  BP: (!) 145/76 125/69 (!) 153/87 133/78  Pulse: 78 78 84 81  Resp: 14 19 18 16   Temp: 98.1 F (36.7 C) 97.9 F (36.6 C) 98.8 F (37.1 C) 98.6 F (37 C)  TempSrc:    Oral  SpO2: 99% 98% 94% 95%  Weight:   87 kg (191 lb 12.8 oz)   Height:        Inpatient medications: . aspirin EC  325 mg Oral Daily  . atorvastatin  40 mg Oral q1800  . calcium acetate  1,334 mg Oral TID WC  . carvedilol  3.125 mg Oral BID WC  . cinacalcet  30 mg Oral Q supper  . doxercalciferol  1.5 mcg Oral Q M,W,F-HD  . feeding supplement (NEPRO CARB STEADY)  237 mL Oral BID BM  . insulin aspart  0-5 Units Subcutaneous QHS  . insulin aspart  0-9 Units Subcutaneous TID WC  . multivitamin  1 tablet Oral QHS  . senna  1 tablet Oral BID  . tamsulosin  0.4 mg Oral Daily     sodium chloride, sodium chloride, acetaminophen, ALPRAZolam, alteplase, heparin, hydrALAZINE, HYDROmorphone (DILAUDID) injection, lidocaine (PF), lidocaine-prilocaine, nitroGLYCERIN, ondansetron (ZOFRAN) IV, pentafluoroprop-tetrafluoroeth  Exam: No distress, up in chair eating breakfast No jvd Chest clear bilat RRR soft sem no RG Abd soft ntnd no mass Ext no edema NF, Ox 3 L arm AVF +bruising but lower in arm, needle sites not affected  Dialysis: MWF [facility] 4 hours 15 minutes 400/800 82.5 kg 2.0K/2.25 Ca  Heparin 7000 units IV q treatment Mircera 100 mcg IV q 2 weeks (last dose 09/29/15 last in-center HGB 9.9 09/29/15) Calcitriol 1.5 mcg PO q tx (Last PTH 458 09/01/15) Calcium Acetate binders 1334 mg PO TIC AC (last phos 5.8 Ca 8.2 C Ca 8..4 09/01/15) Sensipar 30 mg PO q PM       Assessment: 1. Chest Pain/H/O CAD: per primary, cardiology feels noncardiac CP. 2.  ESRD -  MWF hd 3.  Hypertension - only taking coreg at home , he says that  they stopped the norvasc. Cont coreg at home dose 6.25 bid.  4.  Anemia  -HGB pending. Recent ESA dose. Follow HGB 5.  Metabolic bone disease - Continue binders/VDRA/sensipar.  6.  Nutrition - Renal Carb mod diet. Nepro/renal vit 7.  DM: per primary 8. H/O systoic HF: per primary 9. Volume- has not been able to get to dry wt, raising dry wt to 83.5 kg  Plan - OK for dc from renal standpoint   Kelly Splinter MD [facility] pager [phone]    cell [phone] 10/03/2015, 9:46 AM    Recent Labs Lab 10/01/15 1424 10/02/15 1032 10/03/15 0647  NA 135 132* 133*  K 4.4 4.8 4.9  CL 99* 99* 97*  CO2 24 21* 25  GLUCOSE 130* 101* 111*  BUN 66* 71* 48*  CREATININE 9.14* 10.03* 8.34*  CALCIUM 8.6* 8.4* 8.4*  PHOS  --  7.1*  --     Recent Labs Lab 10/02/15 1032  ALBUMIN 3.0*    Recent Labs Lab 10/01/15 1424 10/02/15 0859 10/03/15 0647  WBC 6.8 6.4 7.0  HGB 9.9* 9.7* 9.9*  HCT 31.3* 31.5* 31.2*  MCV 96.0 96.0 96.6  PLT 179 210 186   Iron/TIBC/Ferritin/ %Sat    Component Value Date/Time  IRON 41 (L) 03/11/2015 1534   TIBC 188 (L) 03/11/2015 1534   FERRITIN 122 03/11/2015 1534   IRONPCTSAT 22 03/11/2015 1534

## 2015-10-03 NOTE — Plan of Care (Signed)
Problem: Fluid Volume: Goal: Ability to maintain a balanced intake and output will improve Outcome: Progressing Education for Fluid intake restrictions.

## 2015-10-03 NOTE — Discharge Summary (Signed)
Physician Discharge Summary  Eric [name] HAL:[id number] DOB: 1955-08-06 DOA: 10/01/2015  PCP: [name], NP  Admit date: 10/01/2015 Discharge date: 10/03/2015  Time spent: 35 minutes  Recommendations for Outpatient Follow-up:  1. Repeat BMET to follow electrolytes 2. Reassess  BP and adjust medications as needed    Discharge Diagnoses:  Principal Problem:   Pain in the chest Active Problems:   Chest pain   AICD (automatic cardioverter/defibrillator) present   Essential hypertension, benign   Diabetes mellitus type 2, controlled ([place])   ESRD on dialysis ([place])   CAD (coronary artery disease)   Chronic systolic (congestive) heart failure (HCC)   BPH (benign prostatic hyperplasia)   Discharge Condition: stable and improved. Discharge home with instructions to follow up with PCP and cardiology service as an outpatient.  Diet recommendation: heart healthy, low carb and renal diet   Filed Weights   10/02/15 0945 10/02/15 1309 10/03/15 0415  Weight: 85.3 kg (188 lb 0.8 oz) 85 kg (187 lb 6.3 oz) 87 kg (191 lb 12.8 oz)    History of present illness:  60 y.o.malewith medical history significant of ESRD-HD (started on Feb. 2017), hypertension, hyperlipidemia, diabetes mellitus, CAD, myocardial infarction, obesity, systolic congestive heart failure with the EF of 45-50 percent, AICD placement, liver cirrhosis, who presents with chest pain. Patient states that he was in dialysis center for scheduled dialysis today, but was not able to start dialysis since he started having chest pain. His chest pain is located in the front chest, constant, 10 out of 10 severity, pressure-like, radiating to both arms and legs. It is not aggravated or alleviated by any known factors. He has mild SOB, but no cough, fever or chills. Patient denies nausea, vomiting, abdominal pain,diarrhea symptoms of UTI or unilateral weakness.   Hospital Course:  Chest pain: pt has hx of CAD and MI. Now presents  with pressure-like persistent chest pain with mildly elevated troponin 0.03, admitted to r/o ACS and for further evaluation and treatment.  -CT angiogram of the chest and abdomen/pelvis are negative for PE or dissection. - troponin flat elevated and non significant in patient with ESRD - resume coreg - will continue ASA and lipitor  - 2d echo: never done and to be pursuit as an outpatient as per cardiology discretion  -appreciate Dr. Einar Gip assistance and recommendations. No further work up needed as inpatient. -will discharge home and patient will follow up with PCP and cardiology service as an outpatient   HTN: Blood pressure 182/106 on admission. Patient's not taking home medications, amlodipine was discontinued and patient also stop coreg. -Restart Coreg low dose 6.25mg  BID -low sodium diet and volume/BP control with HD  DM-II:Last A1c 5.9 on 03/12/15, well controled. Patient is taking glipizide at home -will resume home hypoglycemic regimen   ESRD on dialysis (MWF): could not do HD today due to chest pain. Potassium 4.4, bicarbonate 24, creatinine 9.41, BUN 66 -Continue calcitriol, PhosLo, calcium carbonate and Sensipar Resume HD treatment on 10/04/15 -renal planning adjustment in [facility]W  Anemia of chronic kidney disease -continue IV iron and aranesp as per renal discretion -Hgb stable   Chronic systolic (congestive) heart failure (HCC):last 2-D echo on 03/11/15 showed an EF of 45-50%. No JVD appreciated  -currently compensated. -Volume management per renal with HD -advise to follow low sodium diet and to check weight on daily basis  -will resume coreg 6.25 BId -outpatient follow up with cardiology service    Procedures:  Inpatient HD  Consultations:  Renal service  Cardiology   Discharge Exam: Vitals:   10/03/15 0734 10/03/15 1121  BP: 133/78 110/73  Pulse: 81 70  Resp: 16 18  Temp: 98.6 F (37 C) 98.2 F (36.8 C)    General: afebrile, no SOB, denies nausea  and vomiting. Still with some intermittent right chest pressure; but overall improved.   Cardiovascular: S1 and S2, no rubs, no gallops, soft systolic murmur   Respiratory: no wheezing, no crackles  Abdomen: soft, NT, ND, positive BS  Musculoskeletal: trace edema bilaterally   Neurologic exam: AAOX3, no focal deficit     Discharge Instructions   Discharge Instructions    Diet - low sodium heart healthy    Complete by:  As directed   Discharge instructions    Complete by:  As directed   Take medications as prescribed  Follow up with cardiology service as an outpatient (Dr. Einar Gip; call office for appointment details) Follow low sodium heart healthy diet  Be compliant with your HD treatment Check your weight on daily basis     Current Discharge Medication List    START taking these medications   Details  atorvastatin (LIPITOR) 40 MG tablet Take 1 tablet (40 mg total) by mouth daily at 6 PM. Qty: 30 tablet, Refills: 1      CONTINUE these medications which have CHANGED   Details  carvedilol (COREG) 6.25 MG tablet Take 1 tablet (6.25 mg total) by mouth 2 (two) times daily with a meal. Qty: 60 tablet, Refills: 1      CONTINUE these medications which have NOT CHANGED   Details  acetaminophen (TYLENOL) 325 MG tablet Take 2 tablets (650 mg total) by mouth every 6 (six) hours as needed for mild pain or moderate pain. Qty: 120 tablet, Refills: 0    aspirin EC 81 MG tablet Take 81 mg by mouth daily.    calcitRIOL (ROCALTROL) 1 MCG/ML solution Place 1 mL (1 mcg total) into feeding tube every Monday, Wednesday, and Friday with hemodialysis. Qty: 15 mL, Refills: 12    calcium acetate (PHOSLO) 667 MG capsule Take 2 capsules (1,334 mg total) by mouth 3 (three) times daily with meals. Qty: 90 capsule, Refills: 0    calcium carbonate (TUMS EX) 750 MG chewable tablet Chew 2 tablets by mouth daily as needed for heartburn (stomach pain).    cinacalcet (SENSIPAR) 30 MG tablet Take 30  mg by mouth daily with supper.    glipiZIDE (GLUCOTROL) 10 MG tablet Take 10 mg by mouth daily. Refills: 1    multivitamin (RENA-VIT) TABS tablet Take 1 tablet by mouth at bedtime. Qty: 30 tablet, Refills: 0    senna (SENOKOT) 8.6 MG TABS tablet Take 1 tablet (8.6 mg total) by mouth 2 (two) times daily. Qty: 30 each, Refills: 0    tamsulosin (FLOMAX) 0.4 MG CAPS capsule Take 1 capsule (0.4 mg total) by mouth daily. Qty: 30 capsule, Refills: 0       No Known Allergies Follow-up Information    [name], MICHELLE P, NP. Schedule an appointment as soon as possible for a visit in 10 day(s).   Specialty:  Internal Medicine Why:  for hopsital follow up  Contact information: [place] [address] [phone]        Adrian Prows, MD. Call today.   Specialty:  Cardiology Why:  call office for appointment details  Contact information: [address] [place] [address] [phone]  The results of significant diagnostics from this hospitalization (including imaging, microbiology, ancillary and laboratory) are listed below for reference.    Significant Diagnostic Studies: Dg Chest Portable 1 View  Result Date: 10/01/2015 CLINICAL DATA:  Chest pain and shortness of breath. Chronic renal failure. EXAM: PORTABLE CHEST 1 VIEW COMPARISON:  Jun 14, 2015 FINDINGS: There is no edema or consolidation. Heart is borderline enlarged with pulmonary vascularity within normal limits. Pacemaker lead tip is attached to the right ventricle. No pneumothorax. No adenopathy. No bone lesions. IMPRESSION: No edema or consolidation. Stable cardiac prominence. No change in pacemaker lead position. No pneumothorax. Electronically Signed   By: Lowella Grip III M.D.   On: 10/01/2015 14:59   Ct Angio Chest/abd/pel For Dissection W And/or Wo Contrast  Result Date: 10/01/2015 CLINICAL DATA:  Chest pain, assess for dissection. History of pneumonia, CHF, hypertension,  end-stage renal disease on dialysis, small bowel obstruction, diabetes, cirrhosis, pancreatitis. EXAM: CT ANGIOGRAPHY CHEST, ABDOMEN AND PELVIS TECHNIQUE: Multidetector CT imaging through the chest, abdomen and pelvis was performed using the standard protocol during bolus administration of intravenous contrast. Multiplanar reconstructed images and MIPs were obtained and reviewed to evaluate the vascular anatomy. CONTRAST:  100 cc Isovue 370 COMPARISON:  Chest radiograph July 31, 2015 at 1444 hours and CT chest March 18, 2015 and CT abdomen and pelvis June 24, 2015 FINDINGS: CTA CHEST FINDINGS ARTERIES: Thoracic aorta is normal course and caliber. No intrinsic density on noncontrast CT. Homogeneous contrast opacification of thoracic aorta without dissection, aneurysm, luminal irregularity, periaortic fluid collections, or contrast extravasation. Mild calcific atherosclerosis. MEDIASTINUM: The heart is moderately enlarged unchanged. Moderate coronary artery calcifications. No pericardial effusions. Cardiac pacemaker with lead tips in RIGHT ventricle. LUNGS: Focal soft tissue RIGHT trachea, as this is new from prior CT, disc most compatible with aspiration. Lingula calcified granuloma. 5 mm sub solid RIGHT middle lobe subpleural pulmonary nodule was 9 mm. Dependent atelectasis. No pleural effusion or focal consolidation, improved from prior CT. No central pulmonary embolus. SOFT TISSUES AND OSSEOUS STRUCTURES: Focal sclerosis RIGHT humeral head, possible infarct or old trauma. Status post median sternotomy. Review of the MIP images confirms the above findings. CTA ABDOMEN AND PELVIS FINDINGS ARTERIES: Abdominal aorta is normal course and caliber. Homogeneous contrast opacification of aortoiliac vessels without dissection, aneurysm, luminal irregularity, periaortic fluid collections, or contrast extravasation. Moderate calcific atherosclerosis. Severe stenosis celiac access origin. Widely patent superior mesenteric  artery origin. Widely patent RIGHT renal artery origin, no demonstrable LEFT renal artery. Moderate stenosis inferior mesenteric artery origin, which is patent. Tandem stenoses bilateral internal iliac artery branches. SOLID ORGANS: The pancreas and adrenal glands are unremarkable. Nodular liver. No hyper enhancing masses. Status post cholecystectomy. Mild splenomegaly. GASTROINTESTINAL TRACT: The stomach, small and large bowel are normal in course and caliber without inflammatory changes. Moderate colonic diverticulosis. Normal appendix. KIDNEYS/ URINARY TRACT: Severely atrophic LEFT kidney. RIGHT kidney is orthotopic, without hydronephrosis or definite renal mass is. Limited assessment for nonobstructing nephrolithiasis due to bolus timing. PERITONEUM/RETROPERITONEUM: No intraperitoneal free fluid nor free air. Aortoiliac vessels are normal in course and caliber. Similar mild porta hepatis and portacaval lymphadenopathy is likely reactive. Internal reproductive organs are unremarkable. SOFT TISSUE/OSSEOUS STRUCTURES: Nonsuspicious. Status post L2-3 laminectomies. Review of the MIP images confirms the above findings. IMPRESSION: CTA CHEST: No acute vascular process, no dissection. Stable cardiomegaly. Tracheal aspirate. Improved aeration lungs.  Dependent atelectasis. CTA ABDOMEN AND PELVIS: No acute vascular process, no dissection. Severe stenosis celiac axis origin. Moderate stenosis IMA origin. SMA widely  patent. No acute intra-abdominal or pelvic process. Cirrhosis without liver failure.  Mild splenomegaly. Electronically Signed   By: Elon Alas M.D.   On: 10/01/2015 19:49    Microbiology: Recent Results (from the past 240 hour(s))  MRSA PCR Screening     Status: None   Collection Time: 10/02/15  1:44 AM  Result Value Ref Range Status   MRSA by PCR NEGATIVE NEGATIVE Final    Comment:        The GeneXpert MRSA Assay (FDA approved for NASAL specimens only), is one component of a comprehensive  MRSA colonization surveillance program. It is not intended to diagnose MRSA infection nor to guide or monitor treatment for MRSA infections.      Labs: Basic Metabolic Panel:  Recent Labs Lab 10/01/15 1424 10/02/15 1032 10/03/15 0647  NA 135 132* 133*  K 4.4 4.8 4.9  CL 99* 99* 97*  CO2 24 21* 25  GLUCOSE 130* 101* 111*  BUN 66* 71* 48*  CREATININE 9.14* 10.03* 8.34*  CALCIUM 8.6* 8.4* 8.4*  PHOS  --  7.1*  --    Liver Function Tests:  Recent Labs Lab 10/02/15 1032  ALBUMIN 3.0*   CBC:  Recent Labs Lab 10/01/15 1424 10/02/15 0859 10/03/15 0647  WBC 6.8 6.4 7.0  HGB 9.9* 9.7* 9.9*  HCT 31.3* 31.5* 31.2*  MCV 96.0 96.0 96.6  PLT 179 210 186   Cardiac Enzymes:  Recent Labs Lab 10/01/15 2059 10/02/15 0253 10/02/15 0859  TROPONINI 0.03* 0.03* 0.04*   BNP: BNP (last 3 results)  Recent Labs  03/10/15 1444 06/14/15 1355  BNP 1,086.9* 75.2   CBG:  Recent Labs Lab 10/02/15 1430 10/02/15 1627 10/02/15 2114 10/03/15 0733 10/03/15 1120  GLUCAP 91 156* 175* 108* 156*    Signed:  Barton Dubois MD.  [facility] 10/03/2015, 11:49 AM

## 2015-11-11 ENCOUNTER — Ambulatory Visit: Payer: Medicare Other | Admitting: Neurology

## 2015-11-11 ENCOUNTER — Telehealth: Payer: Self-pay

## 2015-11-11 NOTE — Telephone Encounter (Signed)
Pt called this morning to reschedule his appt, said that his ride had not shown up yet.

## 2015-11-12 ENCOUNTER — Encounter (HOSPITAL_COMMUNITY): Payer: Self-pay | Admitting: Emergency Medicine

## 2015-11-12 ENCOUNTER — Emergency Department (HOSPITAL_COMMUNITY): Payer: Medicare Other

## 2015-11-12 ENCOUNTER — Emergency Department (HOSPITAL_COMMUNITY)
Admission: EM | Admit: 2015-11-12 | Discharge: 2015-11-12 | Disposition: A | Discharge status: Home / Self Care | Payer: Medicare Other | Attending: Emergency Medicine | Admitting: Emergency Medicine

## 2015-11-12 DIAGNOSIS — R531 Weakness: Secondary | ICD-10-CM | POA: Diagnosis not present

## 2015-11-12 DIAGNOSIS — Z95810 Presence of automatic (implantable) cardiac defibrillator: Secondary | ICD-10-CM | POA: Insufficient documentation

## 2015-11-12 DIAGNOSIS — I132 Hypertensive heart and chronic kidney disease with heart failure and with stage 5 chronic kidney disease, or end stage renal disease: Secondary | ICD-10-CM | POA: Diagnosis not present

## 2015-11-12 DIAGNOSIS — Z79899 Other long term (current) drug therapy: Secondary | ICD-10-CM | POA: Insufficient documentation

## 2015-11-12 DIAGNOSIS — N186 End stage renal disease: Secondary | ICD-10-CM | POA: Insufficient documentation

## 2015-11-12 DIAGNOSIS — Z7982 Long term (current) use of aspirin: Secondary | ICD-10-CM | POA: Diagnosis not present

## 2015-11-12 DIAGNOSIS — R079 Chest pain, unspecified: Secondary | ICD-10-CM

## 2015-11-12 DIAGNOSIS — I5043 Acute on chronic combined systolic (congestive) and diastolic (congestive) heart failure: Secondary | ICD-10-CM | POA: Diagnosis not present

## 2015-11-12 DIAGNOSIS — E1122 Type 2 diabetes mellitus with diabetic chronic kidney disease: Secondary | ICD-10-CM | POA: Insufficient documentation

## 2015-11-12 DIAGNOSIS — R0789 Other chest pain: Secondary | ICD-10-CM | POA: Insufficient documentation

## 2015-11-12 DIAGNOSIS — Z951 Presence of aortocoronary bypass graft: Secondary | ICD-10-CM | POA: Diagnosis not present

## 2015-11-12 DIAGNOSIS — Z955 Presence of coronary angioplasty implant and graft: Secondary | ICD-10-CM | POA: Insufficient documentation

## 2015-11-12 DIAGNOSIS — Z992 Dependence on renal dialysis: Secondary | ICD-10-CM | POA: Diagnosis not present

## 2015-11-12 DIAGNOSIS — Z8673 Personal history of transient ischemic attack (TIA), and cerebral infarction without residual deficits: Secondary | ICD-10-CM | POA: Insufficient documentation

## 2015-11-12 DIAGNOSIS — I252 Old myocardial infarction: Secondary | ICD-10-CM | POA: Diagnosis not present

## 2015-11-12 DIAGNOSIS — I2510 Atherosclerotic heart disease of native coronary artery without angina pectoris: Secondary | ICD-10-CM | POA: Diagnosis not present

## 2015-11-12 DIAGNOSIS — Z87891 Personal history of nicotine dependence: Secondary | ICD-10-CM | POA: Diagnosis not present

## 2015-11-12 DIAGNOSIS — Z7984 Long term (current) use of oral hypoglycemic drugs: Secondary | ICD-10-CM | POA: Diagnosis not present

## 2015-11-12 LAB — COMPREHENSIVE METABOLIC PANEL
ALT: 19 U/L (ref 17–63)
AST: 16 U/L (ref 15–41)
Albumin: 3.6 g/dL (ref 3.5–5.0)
Alkaline Phosphatase: 60 U/L (ref 38–126)
Anion gap: 12 (ref 5–15)
BUN: 55 mg/dL — AB (ref 6–20)
CHLORIDE: 100 mmol/L — AB (ref 101–111)
CO2: 24 mmol/L (ref 22–32)
CREATININE: 8 mg/dL — AB (ref 0.61–1.24)
Calcium: 9.3 mg/dL (ref 8.9–10.3)
GFR, EST AFRICAN AMERICAN: 7 mL/min — AB (ref >60)
GFR, EST NON AFRICAN AMERICAN: 6 mL/min — AB (ref >60)
Glucose, Bld: 119 mg/dL — ABNORMAL HIGH (ref 65–99)
POTASSIUM: 4.3 mmol/L (ref 3.5–5.1)
SODIUM: 136 mmol/L (ref 135–145)
Total Bilirubin: 0.3 mg/dL (ref 0.3–1.2)
Total Protein: 9.7 g/dL — ABNORMAL HIGH (ref 6.5–8.1)

## 2015-11-12 LAB — CBC WITH DIFFERENTIAL/PLATELET
BASOS ABS: 0 10*3/uL (ref 0.0–0.1)
Basophils Relative: 0 %
EOS ABS: 0.1 10*3/uL (ref 0.0–0.7)
EOS PCT: 1 %
HCT: 29 % — ABNORMAL LOW (ref 39.0–52.0)
Hemoglobin: 9.3 g/dL — ABNORMAL LOW (ref 13.0–17.0)
Lymphocytes Relative: 25 %
Lymphs Abs: 2.9 10*3/uL (ref 0.7–4.0)
MCH: 30 pg (ref 26.0–34.0)
MCHC: 32.1 g/dL (ref 30.0–36.0)
MCV: 93.5 fL (ref 78.0–100.0)
MONO ABS: 1 10*3/uL (ref 0.1–1.0)
Monocytes Relative: 8 %
Neutro Abs: 7.8 10*3/uL — ABNORMAL HIGH (ref 1.7–7.7)
Neutrophils Relative %: 66 %
PLATELETS: 264 10*3/uL (ref 150–400)
RBC: 3.1 MIL/uL — AB (ref 4.22–5.81)
RDW: 14.5 % (ref 11.5–15.5)
WBC: 11.9 10*3/uL — AB (ref 4.0–10.5)

## 2015-11-12 LAB — I-STAT TROPONIN, ED
TROPONIN I, POC: 0.03 ng/mL (ref 0.00–0.08)
Troponin i, poc: 0.03 ng/mL (ref 0.00–0.08)

## 2015-11-12 NOTE — ED Triage Notes (Signed)
Pt states last night he started having cough/body aches and feeling "pressure all over body and in chest" Pt is dialysis pt and last dialysis was Wednesday. Did not go today but of "feeling bad all over". Pt denies chills.  EMS gave 324 mg ASA en route. Dialysis graft in right upper arm.

## 2015-11-12 NOTE — Discharge Instructions (Signed)
Please read and follow all provided instructions.  Your diagnoses today include:  1. Generalized weakness   2. Chest pain, unspecified type     Tests performed today include:  An EKG of your heart  A chest x-ray - no infection  Cardiac enzymes - a blood test for heart muscle damage  Blood counts and electrolytes - shows slightly high white blood cells  Vital signs. See below for your results today.   Medications prescribed:   None  Take any prescribed medications only as directed.  Follow-up instructions: Please follow-up with your primary care provider as soon as you can for further evaluation of your symptoms.   Return instructions:  SEEK IMMEDIATE MEDICAL ATTENTION IF:  You have severe chest pain, especially if the pain is crushing or pressure-like and spreads to the arms, back, neck, or jaw, or if you have sweating, nausea (feeling sick to your stomach), or shortness of breath. THIS IS AN EMERGENCY. Don't wait to see if the pain will go away. Get medical help at once. Call 911 or 0 (operator). DO NOT drive yourself to the hospital.   Your chest pain gets worse and does not go away with rest.   You have an attack of chest pain lasting longer than usual, despite rest and treatment with the medications your caregiver has prescribed.   You wake from sleep with chest pain or shortness of breath.  You feel dizzy or faint.  You have chest pain not typical of your usual pain for which you originally saw your caregiver.   You have any other emergent concerns regarding your health.  Additional Information: Chest pain comes from many different causes. Your caregiver has diagnosed you as having chest pain that is not specific for one problem, but does not require admission.  You are at low risk for an acute heart condition or other serious illness.   Your vital signs today were: BP 168/83 (BP Location: Left Arm)    Pulse 76    Temp 98.1 F (36.7 C) (Oral)    Resp 22    Ht 5'  6" (1.676 m)    Wt 85.3 kg    SpO2 100%    BMI 30.34 kg/m  If your blood pressure (BP) was elevated above 135/85 this visit, please have this repeated by your doctor within one month. --------------

## 2015-11-12 NOTE — ED Notes (Signed)
Pt ambulated in hall with walker (states he uses walker at home and usually only walks from bed to bathroom to living room).  Pt stated when he ambulated his pain did increase, but he was able to ambulated.  HR and O2 sats remained stable.

## 2015-11-12 NOTE — ED Provider Notes (Signed)
Patient signed out to me by [facility].  Patient seen by and discussed with Dr. Rex Kras, who recommends discharge pending negative delta troponin.  5:54 PM Delta trop negative.   Montine Circle, PA-C 11/12/15 1754    Davonna Belling, MD 11/12/15 [phone]

## 2015-11-12 NOTE — ED Notes (Signed)
Pt states increase in chest pain.  Pa notified.

## 2015-11-12 NOTE — ED Provider Notes (Signed)
[facility] DEPT Provider Note   CSN: [id number] Arrival date & time: 11/12/15  1242     History   Chief Complaint Chief Complaint  Patient presents with  . Cough  . Chest Pain    HPI Eric [name] is a 60 y.o. male.  Patient with history of end-stage renal disease on dialysis M/W/F, CHF, AICD placement, MI -- presents with complaint of generalized body "heaviness" and chest tightness starting this morning. Last dialysis session was 2 days ago, patient states that he experienced cramping afterwards otherwise it was normal. No shortness of breath or cough. Patient transported by EMS, aspirin was given. States that he feels weak in all extremities. Patient denies fever but has had nasal congestion and rhinorrhea. No vomiting or diarrhea. Patient does make a small amount of urine. Patient denies signs of stroke including: facial droop, slurred speech, aphasia, weakness/numbness in extremities, imbalance/trouble walking. The onset of this condition was acute. The course is constant. Aggravating factors: none. Alleviating factors: none.        Past Medical History:  Diagnosis Date  . AICD (automatic cardioverter/defibrillator) present   . Cardiac arrest due to underlying cardiac condition ([place])   . CHF (congestive heart failure) ([place])   . CKD (chronic kidney disease), stage III   . Edema   . ESRD on dialysis ([place])   . History of blood transfusion 1950's   "related to pinal menigitis; HAD 16 OPERATIONS TOTAL"  . Hyperlipemia   . Hypertension   . Ileus ([place]) 05/2015  . Myocardial infarction 03/2013  . Obesity   . Pneumonia 2016  . Scabies infestation 06/29/2014  . Shortness of breath dyspnea   . Type II diabetes mellitus ([place])    type 2  . Ventricular tachycardia [facility])     Patient Active Problem List   Diagnosis Date Noted  . BPH (benign prostatic hyperplasia)   . CAD (coronary artery disease) 10/01/2015  . Chronic systolic (congestive) heart failure 10/01/2015  . ESRD  on dialysis ([place])   . Encounter for imaging study to confirm nasogastric (NG) tube placement   . Encounter for nasogastric tube placement   . Ileus, postoperative ([place])   . Impaired nasal gastric feeding tube   . Impaired nasogastric feeding tube   . Abdominal distension   . Ileus ([place])   . ESRD (end stage renal disease) ([place])   . Gallstones   . Gallbladder calculus 06/15/2015  . Pancreatitis   . Numbness   . RUQ abdominal pain   . CHF exacerbation ([place]) 03/10/2015  . Heart failure, acute on chronic, systolic and diastolic ([place]) 20/25/4270  . Acute on chronic congestive heart failure ([place])   . Diabetes mellitus with complication ([place])   . Acute-on-chronic kidney injury ([place])   . CHF (congestive heart failure) ([place]) 08/03/2014  . Hypertensive urgency 08/03/2014  . Diabetes mellitus type 2, controlled ([place]) 08/03/2014  . Obesity (BMI 30-39.9) 08/03/2014  . Bilateral leg pain 08/03/2014  . Acute on chronic combined systolic and diastolic CHF (congestive heart failure) ([place]) 07/02/2014  . Acute renal failure superimposed on stage 3 chronic kidney disease ([place]) 07/01/2014  . Acute renal failure ([place]) 06/29/2014  . CKD (chronic kidney disease) stage 3, GFR 30-59 ml/min 06/29/2014  . DM (diabetes mellitus), type 2 with renal complications ([place]) 62/37/6283  . Rash and nonspecific skin eruption 06/29/2014  . AICD (automatic cardioverter/defibrillator) present 06/29/2014  . Essential hypertension, benign 06/29/2014  . Hepatic cirrhosis ([place]) 11/28/2013  . Cardiomyopathy, ischemic 11/28/2013  .  Hepatitis C antibody test positive 11/28/2013  . Cirrhosis ([place])   . Pain in the chest   . Type 2 diabetes mellitus with hyperglycemia ([place])   . Chest pain 11/26/2013  . Diabetes mellitus with hypoglycemia ([place]) 04/29/2013  . HCAP (healthcare-associated pneumonia) 04/28/2013  . Fever 04/28/2013  . Acute respiratory failure ([place]) 04/28/2013  . DM type 2 (diabetes mellitus, type 2) ([place])  04/28/2013  . Pneumonia 04/28/2013  . Hypertension   . Hyperlipemia   . Ventricular fibrillation ([place]) 03/08/2013  . Cardiac arrest ([place]) 03/07/2013  . Seizure ([place]) 03/07/2013  . Acute respiratory failure with hypoxia ([place]) 03/07/2013    Past Surgical History:  Procedure Laterality Date  . AV FISTULA PLACEMENT Right 03/18/2015   Procedure: BRACHIOCEPHALIC ARTERIOVENOUS (AV) FISTULA CREATION;  Surgeon: Angelia Mould, MD;  Location: [facility];  Service: Vascular;  Laterality: Right;  . BACK SURGERY  1950's   "for spinal menigitis; HAD 16 OPERATIONS TOTAL"  . CARDIAC CATHETERIZATION    . CHOLECYSTECTOMY N/A 06/17/2015   Procedure: LAPAROSCOPIC CHOLECYSTECTOMY;  Surgeon: Coralie Keens, MD;  Location: [facility];  Service: General;  Laterality: N/A;  . CORONARY ARTERY BYPASS GRAFT  1999   cabg x4  . 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 HEAD SURGERY  1950'S   "for spinal menigitis; HAD 16 OPERATIONS TOTAL"  . IMPLANTABLE CARDIOVERTER DEFIBRILLATOR IMPLANT  03/24/2013   STJ single chamber ICD implanted by Dr Caryl Comes for secondary prevention  . IMPLANTABLE CARDIOVERTER DEFIBRILLATOR IMPLANT N/A 03/24/2013   Procedure: IMPLANTABLE CARDIOVERTER DEFIBRILLATOR IMPLANT;  Surgeon: Deboraha Sprang, MD;  [facility];  Service: Cardiovascular;  Laterality: N/A;  . INSERTION OF DIALYSIS CATHETER N/A 03/13/2015   Procedure: INSERTION OF TUNNELED DIALYSIS CATHETER;  Surgeon: Conrad Newington, MD;  Location: [facility];  Service: Vascular;  Laterality: N/A;  . LEFT HEART CATHETERIZATION WITH CORONARY/GRAFT ANGIOGRAM  03/07/2013   Procedure: LEFT HEART CATHETERIZATION WITH CORONARY/GRAFT ANGIOGRAM;  Surgeon: Clent Demark, MD;  Location: [facility] CATH LAB;  Service: Cardiovascular;;  . UMBILICAL HERNIA REPAIR N/A 06/24/2015   Procedure: HERNIA REPAIR UMBILICAL ADULT;  Surgeon: Georganna Skeans, MD;  Location: [facility];  Service: General;  Laterality: N/A;       Home  Medications    Prior to Admission medications   Medication Sig Start Date End Date Taking? Authorizing Provider  acetaminophen (TYLENOL) 325 MG tablet Take 2 tablets (650 mg total) by mouth every 6 (six) hours as needed for mild pain or moderate pain. 07/01/15  Yes Smiley Houseman, MD  aspirin EC 81 MG tablet Take 81 mg by mouth daily as needed for moderate pain.    Yes Historical Provider, MD  calcitRIOL (ROCALTROL) 1 MCG/ML solution Place 1 mL (1 mcg total) into feeding tube every Monday, Wednesday, and Friday with hemodialysis. 07/01/15  Yes Smiley Houseman, MD  calcium acetate (PHOSLO) 667 MG capsule Take 2 capsules (1,334 mg total) by mouth 3 (three) times daily with meals. Patient taking differently: Take 2,001 mg by mouth 3 (three) times daily with meals.  03/20/15  Yes Maryann Mikhail, DO  carvedilol (COREG) 6.25 MG tablet Take 1 tablet (6.25 mg total) by mouth 2 (two) times daily with a meal. 10/03/15  Yes Barton Dubois, MD  cinacalcet (SENSIPAR) 30 MG tablet Take 30 mg by mouth daily with supper.   Yes Historical Provider, MD  glipiZIDE (GLUCOTROL) 10 MG tablet Take 10 mg by mouth daily. 02/15/15  Yes Historical Provider, MD  multivitamin (RENA-VIT) TABS tablet Take 1 tablet by mouth at bedtime. 07/01/15  Yes Smiley Houseman, MD  atorvastatin (LIPITOR) 40 MG tablet Take 1 tablet (40 mg total) by mouth daily at 6 PM. Patient not taking: Reported on 11/12/2015 10/03/15   Barton Dubois, MD  senna (SENOKOT) 8.6 MG TABS tablet Take 1 tablet (8.6 mg total) by mouth 2 (two) times daily. Patient not taking: Reported on 11/12/2015 07/01/15   Smiley Houseman, MD  tamsulosin (FLOMAX) 0.4 MG CAPS capsule Take 1 capsule (0.4 mg total) by mouth daily. Patient not taking: Reported on 11/12/2015 07/01/15   Smiley Houseman, MD    Family History Family History  Problem Relation Age of Onset  . Heart disease Mother   . Diabetes Mother     Social History Social History  Substance Use Topics   . Smoking status: Former Smoker    Packs/day: 1.00    Years: 5.00    Types: Cigarettes  . Smokeless tobacco: Never Used     Comment: "quit smoking in the 1960's"  . Alcohol use No     Allergies   Review of patient's allergies indicates no known allergies.   Review of Systems Review of Systems  Constitutional: Negative for fever.  HENT: Positive for congestion and rhinorrhea. Negative for dental problem, sinus pressure and sore throat.   Eyes: Negative for photophobia, discharge, redness and visual disturbance.  Respiratory: Negative for cough and shortness of breath.   Cardiovascular: Positive for chest pain (heaviness).  Gastrointestinal: Negative for abdominal pain, diarrhea, nausea and vomiting.  Genitourinary: Negative for dysuria.  Musculoskeletal: Positive for myalgias. Negative for gait problem, neck pain and neck stiffness.  Skin: Negative for rash.  Neurological: Positive for weakness (generalized) and headaches. Negative for syncope, speech difficulty, light-headedness and numbness.  Psychiatric/Behavioral: Negative for confusion.     Physical Exam Updated Vital Signs BP 173/91 (BP Location: Right Arm)   Pulse 81   Temp 98.1 F (36.7 C) (Oral)   Resp 24   Ht 5\' 6"  (1.676 m)   Wt 85.3 kg   SpO2 100%   BMI 30.34 kg/m   Physical Exam  Constitutional: He is oriented to person, place, and time. He appears well-developed and well-nourished.  HENT:  Head: Normocephalic and atraumatic.  Right Ear: Tympanic membrane, external ear and ear canal normal.  Left Ear: Tympanic membrane, external ear and ear canal normal.  Nose: Nose normal.  Mouth/Throat: Uvula is midline, oropharynx is clear and moist and mucous membranes are normal.  Eyes: Conjunctivae, EOM and lids are normal. Pupils are equal, round, and reactive to light. Right eye exhibits no discharge. Left eye exhibits no discharge.  Neck: Normal range of motion. Neck supple.  Cardiovascular: Normal rate,  regular rhythm and normal heart sounds.   No murmur heard. Pulmonary/Chest: Effort normal and breath sounds normal.  Abdominal: Soft. There is no tenderness.  Musculoskeletal: Normal range of motion.       Cervical back: He exhibits normal range of motion, no tenderness and no bony tenderness.  Bilateral lower extremity weakness -- patient gives very poor effort. No truncal ataxia.   Neurological: He is alert and oriented to person, place, and time. He has normal strength and normal reflexes. No cranial nerve deficit or sensory deficit. He exhibits normal muscle tone. He displays a negative Romberg sign. Coordination and gait normal. GCS eye subscore is 4. GCS verbal subscore is 5. GCS motor subscore is 6.  Reports diplopia, monoccular, left eye -- states chronic  Skin: Skin is warm and dry.  Psychiatric: He has a normal mood and affect.  Nursing note and vitals reviewed.    ED Treatments / Results  Labs (all labs ordered are listed, but only abnormal results are displayed) Labs Reviewed  CBC WITH DIFFERENTIAL/PLATELET - Abnormal; Notable for the following:       Result Value   WBC 11.9 (*)    RBC 3.10 (*)    Hemoglobin 9.3 (*)    HCT 29.0 (*)    Neutro Abs 7.8 (*)    All other components within normal limits  COMPREHENSIVE METABOLIC PANEL - Abnormal; Notable for the following:    Chloride 100 (*)    Glucose, Bld 119 (*)    BUN 55 (*)    Creatinine, Ser 8.00 (*)    Total Protein 9.7 (*)    GFR calc non Af Amer 6 (*)    GFR calc Af Amer 7 (*)    All other components within normal limits  I-STAT TROPOININ, ED    EKG  EKG Interpretation  Date/Time:  Friday November 12 2015 12:49:47 EDT Ventricular Rate:  81 PR Interval:    QRS Duration: 83 QT Interval:  393 QTC Calculation: 457 R Axis:   33 Text Interpretation:  Sinus rhythm Probable left atrial enlargement Nonspecific T abnormalities, lateral leads Baseline wander in lead(s) V1 No significant change since last tracing  Confirmed by LITTLE MD, RACHEL [phone]) on 11/12/2015 2:54:39 PM       Radiology Dg Chest 2 View  Result Date: 11/12/2015 CLINICAL DATA:  Chest heaviness and shortness of Breath EXAM: CHEST  2 VIEW COMPARISON:  10/01/2015 FINDINGS: Cardiac shadow is enlarged. Defibrillator is noted in satisfactory position. Postsurgical changes are seen. Lungs are clear bilaterally. No acute bony abnormality is noted. IMPRESSION: No active cardiopulmonary disease. Electronically Signed   By: Inez Catalina M.D.   On: 11/12/2015 15:16    Procedures Procedures (including critical care time)  Medications Ordered in ED Medications - No data to display   Initial Impression / Assessment and Plan / ED Course  I have reviewed the triage vital signs and the nursing notes.  Pertinent labs & imaging results that were available during my care of the patient were reviewed by me and considered in my medical decision making (see chart for details).  Clinical Course   Patient seen and examined. Reviewed results from previous admission. Patient had several similar complaints. CT angio neg at that time. Ruled out for cardiac CP.   Vital signs reviewed and are as follows: BP 173/91 (BP Location: Right Arm)   Pulse 81   Temp 98.1 F (36.7 C) (Oral)   Resp 24   Ht 5\' 6"  (1.676 m)   Wt 85.3 kg   SpO2 100%   BMI 30.34 kg/m   4:21 PM Patient stable in ED. He has ambulated At baseline with walker to the restroom.  Patient discussed with and seen by Dr. Rex Kras. Will check delta troponin. If negative, patient can be discharged home. He will need to contact his dialysis center for follow-up regarding dialysis.  Patient urged to return with worsening symptoms or other concerns. Patient verbalized understanding and agrees with plan.    Final Clinical Impressions(s) / ED Diagnoses   Final diagnoses:  Generalized weakness  Chest pain, unspecified type   Patient with generalized weakness including chest pain. Workup  demonstrates mildly elevated white blood cell count and signs  and symptoms consistent with end-stage renal disease. There are no emergent indications for dialysis including hyperkalemia, fluid overload. Patient is ambulatory in the emergency department. He has no focal neurologic symptoms to suggest stroke or TIA. Chest x-ray is negative for pneumonia. Pending delta troponin.    New Prescriptions Current Discharge Medication List       Carlisle Cater, PA-C 11/12/15 [place], MD 11/13/15 [phone]

## 2015-11-17 ENCOUNTER — Encounter: Payer: Self-pay | Admitting: Neurology

## 2015-11-23 ENCOUNTER — Other Ambulatory Visit: Payer: Self-pay

## 2015-11-23 DIAGNOSIS — T82510A Breakdown (mechanical) of surgically created arteriovenous fistula, initial encounter: Secondary | ICD-10-CM

## 2015-11-30 ENCOUNTER — Encounter (HOSPITAL_COMMUNITY): Payer: Medicare Other

## 2015-12-01 ENCOUNTER — Ambulatory Visit: Payer: Medicare Other | Admitting: Vascular Surgery

## 2015-12-07 ENCOUNTER — Ambulatory Visit (INDEPENDENT_AMBULATORY_CARE_PROVIDER_SITE_OTHER): Payer: Medicare Other | Admitting: Neurology

## 2015-12-07 ENCOUNTER — Encounter: Payer: Self-pay | Admitting: Neurology

## 2015-12-07 VITALS — BP 197/94 | HR 86 | Ht 66.0 in | Wt 191.0 lb

## 2015-12-07 DIAGNOSIS — R202 Paresthesia of skin: Secondary | ICD-10-CM | POA: Diagnosis not present

## 2015-12-07 DIAGNOSIS — E538 Deficiency of other specified B group vitamins: Secondary | ICD-10-CM

## 2015-12-07 NOTE — Progress Notes (Signed)
Reason for visit: Numbness  Referring physician: Dr. Romero Belling is a 60 y.o. male  History of present illness:  Eric [name] is a 60 year old right-handed black male with a history of end-stage renal disease and diabetes. Eric [name] has a two-year history of increasing problems with numbness in Eric legs and then Eric arms. Eric [name] has numbness he claims that goes up to Eric hip level bilaterally, and from Eric hands all Eric way up to Eric shoulders bilaterally. He denies numbness on Eric face or body. He feels pressure sensations in Eric legs, some heaviness of Eric legs that may alternate from one side to Eric next. Eric [name] reports no falls, he does have some gait instability, he may use a walker on occasion. Eric [name] denies any problems controlling Eric bowels or Eric bladder. He feels some weakness sensations in Eric legs at times. Eric [name] reports no pain from Eric neck or back down Eric arms or legs, he does report some occasional back and neck pain. He is sent to this office for an evaluation.  Past Medical History:  Diagnosis Date  . AICD (automatic cardioverter/defibrillator) present   . Cardiac arrest due to underlying cardiac condition ([place])   . CHF (congestive heart failure) ([place])   . CKD (chronic kidney disease), stage III   . Edema   . ESRD on dialysis ([place])   . History of blood transfusion 1950's   "related to pinal menigitis; HAD 16 OPERATIONS TOTAL"  . Hyperlipemia   . Hypertension   . Ileus ([place]) 05/2015  . Myocardial infarction 03/2013  . Obesity   . Pneumonia 2016  . Scabies infestation 06/29/2014  . Shortness of breath dyspnea   . Type II diabetes mellitus ([place])    type 2  . Ventricular tachycardia [facility])     Past Surgical History:  Procedure Laterality Date  . AV FISTULA PLACEMENT Right 03/18/2015   Procedure: BRACHIOCEPHALIC ARTERIOVENOUS (AV) FISTULA CREATION;  Surgeon: Angelia Mould, MD;  Location: [facility];  Service: Vascular;   Laterality: Right;  . BACK SURGERY  1950's   "for spinal menigitis; HAD 16 OPERATIONS TOTAL"  . CARDIAC CATHETERIZATION    . CHOLECYSTECTOMY N/A 06/17/2015   Procedure: LAPAROSCOPIC CHOLECYSTECTOMY;  Surgeon: Coralie Keens, MD;  Location: [facility];  Service: General;  Laterality: N/A;  . CORONARY ARTERY BYPASS GRAFT  1999   cabg x4  . 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 HEAD SURGERY  1950'S   "for spinal menigitis; HAD 16 OPERATIONS TOTAL"  . IMPLANTABLE CARDIOVERTER DEFIBRILLATOR IMPLANT  03/24/2013   STJ single chamber ICD implanted by Dr Caryl Comes for secondary prevention  . IMPLANTABLE CARDIOVERTER DEFIBRILLATOR IMPLANT N/A 03/24/2013   Procedure: IMPLANTABLE CARDIOVERTER DEFIBRILLATOR IMPLANT;  Surgeon: Deboraha Sprang, MD;  Location: [facility] CATH LAB;  Service: Cardiovascular;  Laterality: N/A;  . INSERTION OF DIALYSIS CATHETER N/A 03/13/2015   Procedure: INSERTION OF TUNNELED DIALYSIS CATHETER;  Surgeon: Conrad , MD;  Location: [facility];  Service: Vascular;  Laterality: N/A;  . LEFT HEART CATHETERIZATION WITH CORONARY/GRAFT ANGIOGRAM  03/07/2013   Procedure: LEFT HEART CATHETERIZATION WITH CORONARY/GRAFT ANGIOGRAM;  Surgeon: Clent Demark, MD;  Location: [facility] CATH LAB;  Service: Cardiovascular;;  . UMBILICAL HERNIA REPAIR N/A 06/24/2015   Procedure: HERNIA REPAIR UMBILICAL ADULT;  Surgeon: Georganna Skeans, MD;  Location[facility] OR;  Service: General;  Laterality: N/A;    Family History  Problem Relation Age of  Onset  . Heart disease Mother   . Diabetes Mother     Social history:  reports that he has quit smoking. His smoking use included Cigarettes. He has a 5.00 pack-year smoking history. He has never used smokeless tobacco. He reports that he does not drink alcohol or use drugs.  Medications:  Prior to Admission medications   Medication Sig Start Date End Date Taking? Authorizing Provider  acetaminophen (TYLENOL) 325 MG tablet Take 2 tablets (650 mg  total) by mouth every 6 (six) hours as needed for mild pain or moderate pain. 07/01/15  Yes Smiley Houseman, MD  aspirin EC 81 MG tablet Take 81 mg by mouth daily as needed for moderate pain.    Yes Historical Provider, MD  atorvastatin (LIPITOR) 40 MG tablet Take 1 tablet (40 mg total) by mouth daily at 6 PM. 11/25/15  Yes Historical Provider, MD  calcitRIOL (ROCALTROL) 1 MCG/ML solution Place 1 mL (1 mcg total) into feeding tube every Monday, Wednesday, and Friday with hemodialysis. 07/01/15  Yes Smiley Houseman, MD  calcium acetate (PHOSLO) 667 MG capsule Take 2 capsules (1,334 mg total) by mouth 3 (three) times daily with meals. [name] taking differently: Take 2,001 mg by mouth 3 (three) times daily with meals.  03/20/15  Yes Maryann Mikhail, DO  carvedilol (COREG) 6.25 MG tablet Take 1 tablet (6.25 mg total) by mouth 2 (two) times daily with a meal. 10/03/15  Yes Barton Dubois, MD  cinacalcet (SENSIPAR) 30 MG tablet Take 30 mg by mouth daily with supper.   Yes Historical Provider, MD  glipiZIDE (GLUCOTROL) 10 MG tablet Take 10 mg by mouth daily. 02/15/15  Yes Historical Provider, MD  multivitamin (RENA-VIT) TABS tablet Take 1 tablet by mouth at bedtime. 07/01/15  Yes Smiley Houseman, MD     No Known Allergies  ROS:  Out of a complete 14 system review of symptoms, Eric [name] complains only of Eric following symptoms, and all other reviewed systems are negative.  Swelling in Eric legs Muscle cramps Numbness, weakness  Blood pressure (!) 197/94, pulse 86, height 5\' 6"  (1.676 m), weight 191 lb (86.6 kg).  Physical Exam  General: Eric [name] is alert and cooperative at Eric time of Eric examination.  Eyes: Pupils are equal, round, and reactive to light. Discs are flat bilaterally.  Neck: Eric neck is supple, no carotid bruits are noted, but there appears be some radiation from a cardiac murmur to Eric right carotid.  Respiratory: Eric respiratory examination is clear.  Cardiovascular:  Eric cardiovascular examination reveals a regular rate and rhythm, with a grade II/VI systolic murmur noted at Eric aortic area.  Skin: Extremities are with 1-2+ edema below Eric knees bilaterally.  Neurologic Exam  Mental status: Eric [name] is alert and oriented x 3 at Eric time of Eric examination. Eric [name] has apparent normal recent and remote memory, with an apparently normal attention span and concentration ability.  Cranial nerves: Facial symmetry is present. Eric [name] has bilateral ptosis. There is good sensation of Eric face to pinprick and soft touch bilaterally. Eric strength of Eric facial muscles and Eric muscles to head turning and shoulder shrug are normal bilaterally. Speech is well enunciated, no aphasia or dysarthria is noted. Extraocular movements are full. Visual fields are full. Eric tongue is midline, and Eric [name] has symmetric elevation of Eric soft palate. No obvious hearing deficits are noted.  Motor: Eric motor testing reveals 5 over 5 strength of all 4 extremities. Good symmetric  motor tone is noted throughout.  Sensory: Sensory testing is intact to pinprick, soft touch, vibration sensation, and position sense on Eric upper extremities. With Eric lower extremities, Eric [name] has a stocking pattern pinprick sensory deficit up to Eric knees bilaterally. There is significant impairment of vibration sensation and position sensation in both feet. No evidence of extinction is noted.  Coordination: Cerebellar testing reveals good finger-nose-finger and heel-to-shin bilaterally.  Gait and station: Gait is wide-based, unsteady. Tandem gait is unsteady. Romberg is negative. No drift is seen.  Reflexes: Deep tendon reflexes are symmetric, but are depressed bilaterally. Toes are downgoing bilaterally.   Assessment/Plan:  1. Probable peripheral neuropathy  2. End-stage renal disease on hemodialysis  3. Diabetes  Eric [name] reports numbness in Eric entirety of Eric arms and legs  bilaterally, but Eric clinical examination is most consistent with a peripheral neuropathy. Eric [name] will be set up for blood work today, he will have nerve conduction studies on both legs and one arm. He will have EMG evaluation of one leg. We have discussed potentially starting Cymbalta for Eric discomfort, Eric [name] does not wish to consider this option. He apparently has not tolerated gabapentin previously. He will follow-up for Eric EMG evaluation.  Jill Alexanders MD 12/07/2015 2:33 PM  [facility] [address] [place], [place] [address]  Phone [phone] Fax [phone]

## 2015-12-07 NOTE — Patient Instructions (Signed)
   We will check blood work today, and get EMG and NCV evaluation to check the nerve function in the legs.

## 2015-12-09 ENCOUNTER — Telehealth: Payer: Self-pay | Admitting: Neurology

## 2015-12-09 LAB — MULTIPLE MYELOMA PANEL, SERUM
ALPHA 1: 0.2 g/dL (ref 0.0–0.4)
ALPHA2 GLOB SERPL ELPH-MCNC: 0.7 g/dL (ref 0.4–1.0)
Albumin SerPl Elph-Mcnc: 3.7 g/dL (ref 2.9–4.4)
Albumin/Glob SerPl: 0.8 (ref 0.7–1.7)
B-GLOBULIN SERPL ELPH-MCNC: 2 g/dL — AB (ref 0.7–1.3)
Gamma Glob SerPl Elph-Mcnc: 2.3 g/dL — ABNORMAL HIGH (ref 0.4–1.8)
Globulin, Total: 5.2 g/dL — ABNORMAL HIGH (ref 2.2–3.9)
IGG (IMMUNOGLOBIN G), SERUM: 3605 mg/dL — AB (ref 700–1600)
IGM (IMMUNOGLOBULIN M), SRM: 85 mg/dL (ref 20–172)
IgA/Immunoglobulin A, Serum: 245 mg/dL (ref 61–437)
M PROTEIN SERPL ELPH-MCNC: 1.1 g/dL — AB
Total Protein: 8.9 g/dL — ABNORMAL HIGH (ref 6.0–8.5)

## 2015-12-09 LAB — B. BURGDORFI ANTIBODIES

## 2015-12-09 LAB — ANGIOTENSIN CONVERTING ENZYME: Angio Convert Enzyme: 31 U/L (ref 14–82)

## 2015-12-09 LAB — RHEUMATOID FACTOR: Rhuematoid fact SerPl-aCnc: 16.6 IU/mL — ABNORMAL HIGH (ref 0.0–13.9)

## 2015-12-09 LAB — ANA W/REFLEX: Anti Nuclear Antibody(ANA): NEGATIVE

## 2015-12-09 LAB — VITAMIN B12: VITAMIN B 12: 497 pg/mL (ref 211–946)

## 2015-12-09 NOTE — Telephone Encounter (Signed)
I called patient. The blood work was unremarkable with exception of a minimal elevation in the rheumatoid factor, did not think this is clinically significant. The immunoelectrophoresis showed a monoclonal antibody, IgG with a kappa light chain.  We may consider an oncology referral in the future, the patient will be set up for EMG and nerve conduction study evaluation.

## 2016-01-03 ENCOUNTER — Telehealth: Payer: Self-pay

## 2016-01-03 NOTE — Telephone Encounter (Signed)
Called pt to offer sooner appt for NCV/EMG. May call back if he'd like to schedule (12/21 @ 7:30, 1/10 @ 9:30 or 10:15, 1/11 1:15 or 2 pm).

## 2016-02-09 ENCOUNTER — Encounter: Payer: Medicare Other | Admitting: Neurology

## 2016-02-15 ENCOUNTER — Encounter: Payer: Medicare Other | Admitting: Neurology

## 2016-03-23 ENCOUNTER — Encounter: Payer: Self-pay | Admitting: Cardiology

## 2016-03-28 ENCOUNTER — Encounter: Payer: Medicare Other | Admitting: Neurology

## 2016-05-02 ENCOUNTER — Encounter: Payer: Medicare Other | Admitting: Neurology

## 2016-05-11 ENCOUNTER — Telehealth: Payer: Self-pay | Admitting: Neurology

## 2016-05-11 NOTE — Telephone Encounter (Signed)
error 

## 2016-05-12 ENCOUNTER — Telehealth: Payer: Self-pay | Admitting: Internal Medicine

## 2016-05-12 NOTE — Telephone Encounter (Signed)
Returned call to patient.He stated he needs surgical clearance from [facility] for cataract surgery.Advised his last appointment with Dr.Klein 06/18/14.I will send message to Dr.Klein's nurse Nira Conn.Stated he would like Heather to call him back Mon 05/15/16.

## 2016-05-12 NOTE — Telephone Encounter (Signed)
Request for surgical clearance:  1. What type of surgery is being performed? cataract surgery  2. When is this surgery scheduled? Pending   3. Are there any medications that need to be held prior to surgery and how long?   4. Name of physician performing surgery? Dr. Julian Reil  What is your office phone and fax number? Office # [phone] (Office opens Monday)  Patient requesting a follow up call, Thanks.

## 2016-05-15 NOTE — Telephone Encounter (Signed)
Attempted to call the patient- his clearance was faxed on 05/12/16 for cataract surgery. I was told the patient is currently at dialysis and won't be home until about 11:30 am- I will call back after that.

## 2016-05-15 NOTE — Telephone Encounter (Signed)
I spoke with the patient- he is aware that his clearance was faxed back on Friday from Dr. Caryl Comes to [facility].

## 2017-08-02 ENCOUNTER — Ambulatory Visit (INDEPENDENT_AMBULATORY_CARE_PROVIDER_SITE_OTHER): Payer: Medicare Other | Admitting: *Deleted

## 2017-08-02 DIAGNOSIS — I255 Ischemic cardiomyopathy: Secondary | ICD-10-CM

## 2017-08-02 NOTE — Progress Notes (Signed)
Remote ICD transmission.   

## 2017-09-03 LAB — CUP PACEART REMOTE DEVICE CHECK
Battery Remaining Percentage: 63 %
Battery Voltage: 2.95 V
Brady Statistic RV Percent Paced: 5.6 %
Date Time Interrogation Session: 20190711192044
HIGH POWER IMPEDANCE MEASURED VALUE: 54 Ohm
HighPow Impedance: 54 Ohm
Implantable Lead Implant Date: 20150302
Implantable Lead Location: 753860
Implantable Lead Serial Number: 322161
Implantable Pulse Generator Implant Date: 20150302
Lead Channel Impedance Value: 350 Ohm
Lead Channel Pacing Threshold Amplitude: 1 V
Lead Channel Pacing Threshold Pulse Width: 0.5 ms
Lead Channel Sensing Intrinsic Amplitude: 11.8 mV
Lead Channel Setting Pacing Amplitude: 2.5 V
Lead Channel Setting Pacing Pulse Width: 0.5 ms
MDC IDC MSMT BATTERY REMAINING LONGEVITY: 60 mo
MDC IDC SET LEADCHNL RV SENSING SENSITIVITY: 0.5 mV
Pulse Gen Serial Number: 7175540

## 2017-11-01 ENCOUNTER — Encounter: Payer: Medicare Other | Admitting: *Deleted

## 2017-11-01 ENCOUNTER — Telehealth: Payer: Self-pay

## 2017-11-01 NOTE — Telephone Encounter (Signed)
Confirmed remote transmission w/ pt wife.   

## 2017-11-02 ENCOUNTER — Encounter: Payer: Medicare Other | Admitting: Internal Medicine

## 2017-11-08 ENCOUNTER — Encounter: Payer: Self-pay | Admitting: Cardiology

## 2017-12-28 ENCOUNTER — Encounter: Payer: Self-pay | Admitting: *Deleted

## 2017-12-28 ENCOUNTER — Other Ambulatory Visit: Payer: Self-pay | Admitting: *Deleted

## 2017-12-28 ENCOUNTER — Other Ambulatory Visit: Payer: Self-pay

## 2017-12-28 ENCOUNTER — Encounter: Payer: Self-pay | Admitting: Vascular Surgery

## 2017-12-28 ENCOUNTER — Ambulatory Visit (INDEPENDENT_AMBULATORY_CARE_PROVIDER_SITE_OTHER): Payer: Medicare Other | Admitting: Vascular Surgery

## 2017-12-28 VITALS — BP 173/87 | HR 73 | Temp 97.2°F | Resp 20 | Ht 66.0 in

## 2017-12-28 DIAGNOSIS — N186 End stage renal disease: Secondary | ICD-10-CM

## 2017-12-28 DIAGNOSIS — Z992 Dependence on renal dialysis: Secondary | ICD-10-CM

## 2017-12-28 NOTE — Progress Notes (Signed)
Spoke to [facility] at [place] will rescheduled HD to accommodate surgery on a Tuesday. Patient aware of change.

## 2017-12-28 NOTE — Progress Notes (Signed)
Patient ID: Eric [name], male   DOB: 1955-11-26, 62 y.o.   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 male history of end-stage renal disease on dialysis via right arm AV fistula.  This was placed several years ago here.  Apparently has undergone a few interventions most recently in [place] where he had issues with anesthesia.  Now has an area of ulceration with concern for bleeding.  Has had some minimal bleeding but nothing extensive.  He does not take blood thinners.  Does not have any pain in his right hand.  Past Medical History:  Diagnosis Date  . AICD (automatic cardioverter/defibrillator) present   . 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 CHF (congestive heart failure) ([place])   . CKD (chronic kidney disease), stage III ([place])   . Edema   . ESRD on dialysis ([place])   . History of blood transfusion 1950's   "related to pinal menigitis; HAD 16 OPERATIONS TOTAL"  . Hyperlipemia   . Hypertension   . Ileus ([place]) 05/2015  . Myocardial infarction ([place]) 03/2013  . Obesity   . Pneumonia 2016  . Scabies infestation 06/29/2014  . Shortness of breath dyspnea   . Type II diabetes mellitus ([place])    type 2  . Ventricular tachycardia ([facility])    Family History  Problem Relation Age of Onset  . Heart disease Mother   . Diabetes Mother    Past Surgical History:  Procedure Laterality Date  . AV FISTULA PLACEMENT Right 03/18/2015   Procedure: BRACHIOCEPHALIC ARTERIOVENOUS (AV) FISTULA CREATION;  Surgeon: Angelia Mould, MD;  Location: [facility];  Service: Vascular;  Laterality: Right;  . BACK SURGERY  1950's   "for spinal menigitis; HAD 16 OPERATIONS TOTAL"  . CARDIAC CATHETERIZATION    . CHOLECYSTECTOMY N/A 06/17/2015   Procedure: LAPAROSCOPIC CHOLECYSTECTOMY;  Surgeon: Coralie Keens, MD;  Location: [facility];  Service: General;  Laterality: N/A;  . CORONARY ARTERY BYPASS GRAFT  1999   cabg x4  . 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 HEAD SURGERY  1950'S   "for spinal menigitis; HAD 16 OPERATIONS TOTAL"  . IMPLANTABLE CARDIOVERTER DEFIBRILLATOR IMPLANT  03/24/2013   STJ single chamber ICD implanted by Dr Caryl Comes for secondary prevention  . IMPLANTABLE CARDIOVERTER DEFIBRILLATOR IMPLANT N/A 03/24/2013   Procedure: IMPLANTABLE CARDIOVERTER DEFIBRILLATOR IMPLANT;  Surgeon: Deboraha Sprang, MD;  Location: [facility] CATH LAB;  Service: Cardiovascular;  Laterality: N/A;  . INSERTION OF DIALYSIS CATHETER N/A 03/13/2015   Procedure: INSERTION OF TUNNELED DIALYSIS CATHETER;  Surgeon: Conrad Rutledge, MD;  Location: [facility];  Service: Vascular;  Laterality: N/A;  . LEFT HEART CATHETERIZATION WITH CORONARY/GRAFT ANGIOGRAM  03/07/2013   Procedure: LEFT HEART CATHETERIZATION WITH CORONARY/GRAFT ANGIOGRAM;  Surgeon: Clent Demark, MD;  Location: [facility];  Service: Cardiovascular;;  . UMBILICAL HERNIA REPAIR N/A 06/24/2015   Procedure: HERNIA REPAIR UMBILICAL ADULT;  Surgeon: Georganna Skeans, MD;  Location: [place] [facility];  Service: General;  Laterality: N/A;    Short Social History:  Social History   Tobacco Use  . Smoking status: Former Smoker    Packs/day: 1.00    Years: 5.00    Pack years: 5.00    Types: Cigarettes  . Smokeless tobacco: Never Used  .  Tobacco comment: "quit smoking in the 1960's"  Substance Use Topics  . Alcohol use: No    Alcohol/week: 0.0 standard drinks    Allergies  Allergen Reactions  . Gabapentin     diarhea    Current Outpatient Medications  Medication Sig Dispense Refill  . acetaminophen (TYLENOL) 325 MG tablet Take 2 tablets (650 mg total) by mouth every 6 (six) hours as needed for mild pain or moderate pain. 120 tablet 0  . aspirin EC 81 MG tablet Take 81 mg by mouth daily as needed for moderate pain.     Marland Kitchen atorvastatin (LIPITOR) 40 MG tablet Take  1 tablet (40 mg total) by mouth daily at 6 PM.  1  . calcium acetate (PHOSLO) 667 MG capsule Take 2 capsules (1,334 mg total) by mouth 3 (three) times daily with meals. (Patient taking differently: Take 2,001 mg by mouth 3 (three) times daily with meals. ) 90 capsule 0  . cinacalcet (SENSIPAR) 30 MG tablet Take 30 mg by mouth daily with supper.    Marland Kitchen glipiZIDE (GLUCOTROL) 10 MG tablet Take 10 mg by mouth daily.  1  . multivitamin (RENA-VIT) TABS tablet Take 1 tablet by mouth at bedtime. 30 tablet 0  . pantoprazole (PROTONIX) 20 MG tablet Take 20 mg by mouth daily.    . calcitRIOL (ROCALTROL) 1 MCG/ML solution Place 1 mL (1 mcg total) into feeding tube every Monday, Wednesday, and Friday with hemodialysis. (Patient not taking: Reported on 12/28/2017) 15 mL 12  . carvedilol (COREG) 6.25 MG tablet Take 1 tablet (6.25 mg total) by mouth 2 (two) times daily with a meal. (Patient not taking: Reported on 12/28/2017) 60 tablet 1   No current facility-administered medications for this visit.     Review of Systems  Constitutional:  Constitutional negative. HENT: HENT negative.  Eyes: Eyes negative.  Respiratory: Respiratory negative.  Cardiovascular: Cardiovascular negative.  GI: Gastrointestinal negative.  Skin:       Ulceration in 2 places Neurological: Neurological negative. Hematologic: Hematologic/lymphatic negative.  Psychiatric: Psychiatric negative.        Objective:  Objective   Vitals:   12/28/17 1454  BP: (!) 173/87  Pulse: 73  Resp: 20  Temp: (!) 97.2 F (36.2 C)  SpO2: 100%  Height: 5\' 6"  (1.676 m)   Body mass index is 30.83 kg/m.  Physical Exam  Constitutional: He is oriented to person, place, and time. He appears well-developed.  HENT:  Head: Normocephalic.  Eyes: Pupils are equal, round, and reactive to light.  Neck: Neck supple.  Cardiovascular:  Pulses:      Radial pulses are 2+ on the right side, and 2+ on the left side.  Pulmonary/Chest: Effort normal.    Abdominal: Soft.  Musculoskeletal: He exhibits no edema.  Neurological: He is alert and oriented to person, place, and time.  Skin: Capillary refill takes less than 2 seconds.  There are 2 areas of ulceration on his right arm AV fistula 1 with a 2 cm scab and 1 with a 0.5 cm scab  Psychiatric: He has a normal mood and affect. His behavior is normal. Judgment and thought content normal.         Assessment/Plan:     62 year old male presents for evaluation of right arm fistula that has 1 large area of ulceration one smaller 1 with aneurysmal degeneration.  This has been revised in [place] from which he had complications of anesthesia.  This time it appears he needs to have this revised given  the high risk of bleeding.  I would probably revise both areas at once either with plication or with interposition Artegraft and place a catheter to allow his arm to rest.  We will get him scheduled next week.    Waynetta Sandy MD [facility]

## 2017-12-31 ENCOUNTER — Other Ambulatory Visit: Payer: Self-pay

## 2017-12-31 ENCOUNTER — Encounter (HOSPITAL_COMMUNITY): Payer: Self-pay | Admitting: *Deleted

## 2017-12-31 NOTE — Progress Notes (Signed)
Spoke with pt and his wife, [name] for pre-op call. Pt has cardiac hx, had a CABG done in 1999 - has an internal defibrillator. Has seen Dr. Caryl Comes for that. Pt has recently moved back from [place], [place]. His cardiologist in [place] was Dr. Selena Batten, I have requested last office visit note and any cardiac studies. [name] wife states pt is supposed to be on Plavix, but has run out and has not had it for at least 10 days. Pt states that he had a surgery in February of this year at [facility] in [place] and was given too much anesthesia which dropped his bp very low. He states it "almost killed me". Will need to have pt sign a release form upon arrival to get anesthesia records. Pt denies any recent chest pain or sob. Pt is a type 2 diabetic. Does not know when or what his most recent A1C was. He states his fasting blood sugar is usually around 109. Pt is not longer on any diabetic meds. Instructed pt to check his blood sugar in the AM when he gets up and every 2 hours until he leaves for the hospital. If blood sugar is 70 or below, treat with 1/2 cup of clear juice (apple or cranberry) and recheck blood sugar 15 minutes after drinking juice. If blood sugar continues to be 70 or below, call the Short Stay department and ask to speak to a nurse. Pt states he doesn't have any apple or cranberry juice at home. I asked him if he could get some and he said no. I told him that if is blood sugar is below 70 in the AM, just to call the Short Stay Department and ask to speak to a nurse. He voiced understanding.

## 2018-01-01 ENCOUNTER — Ambulatory Visit (HOSPITAL_COMMUNITY): Payer: Medicare Other | Admitting: Certified Registered Nurse Anesthetist

## 2018-01-01 ENCOUNTER — Encounter (HOSPITAL_COMMUNITY)
Admission: RE | Disposition: A | Discharge status: Home / Self Care | Payer: Self-pay | Source: Home / Self Care | Attending: Internal Medicine

## 2018-01-01 ENCOUNTER — Inpatient Hospital Stay (HOSPITAL_COMMUNITY): Payer: Medicare Other

## 2018-01-01 ENCOUNTER — Inpatient Hospital Stay (HOSPITAL_COMMUNITY)
Admission: RE | Admit: 2018-01-01 | Discharge: 2018-01-05 | DRG: 263 | Disposition: A | Discharge status: Home / Self Care | Payer: Medicare Other | Attending: Internal Medicine | Admitting: Internal Medicine

## 2018-01-01 DIAGNOSIS — T82898A Other specified complication of vascular prosthetic devices, implants and grafts, initial encounter: Secondary | ICD-10-CM | POA: Diagnosis not present

## 2018-01-01 DIAGNOSIS — E669 Obesity, unspecified: Secondary | ICD-10-CM | POA: Diagnosis present

## 2018-01-01 DIAGNOSIS — I272 Pulmonary hypertension, unspecified: Secondary | ICD-10-CM | POA: Diagnosis present

## 2018-01-01 DIAGNOSIS — E1122 Type 2 diabetes mellitus with diabetic chronic kidney disease: Secondary | ICD-10-CM | POA: Diagnosis present

## 2018-01-01 DIAGNOSIS — I132 Hypertensive heart and chronic kidney disease with heart failure and with stage 5 chronic kidney disease, or end stage renal disease: Secondary | ICD-10-CM | POA: Diagnosis present

## 2018-01-01 DIAGNOSIS — Z79899 Other long term (current) drug therapy: Secondary | ICD-10-CM

## 2018-01-01 DIAGNOSIS — Z833 Family history of diabetes mellitus: Secondary | ICD-10-CM

## 2018-01-01 DIAGNOSIS — K7460 Unspecified cirrhosis of liver: Secondary | ICD-10-CM | POA: Diagnosis present

## 2018-01-01 DIAGNOSIS — L98499 Non-pressure chronic ulcer of skin of other sites with unspecified severity: Secondary | ICD-10-CM | POA: Diagnosis present

## 2018-01-01 DIAGNOSIS — N2581 Secondary hyperparathyroidism of renal origin: Secondary | ICD-10-CM | POA: Diagnosis present

## 2018-01-01 DIAGNOSIS — T8241XA Breakdown (mechanical) of vascular dialysis catheter, initial encounter: Secondary | ICD-10-CM | POA: Diagnosis present

## 2018-01-01 DIAGNOSIS — Z888 Allergy status to other drugs, medicaments and biological substances status: Secondary | ICD-10-CM

## 2018-01-01 DIAGNOSIS — D649 Anemia, unspecified: Secondary | ICD-10-CM | POA: Diagnosis present

## 2018-01-01 DIAGNOSIS — I2510 Atherosclerotic heart disease of native coronary artery without angina pectoris: Secondary | ICD-10-CM | POA: Diagnosis present

## 2018-01-01 DIAGNOSIS — I249 Acute ischemic heart disease, unspecified: Secondary | ICD-10-CM | POA: Diagnosis present

## 2018-01-01 DIAGNOSIS — M898X9 Other specified disorders of bone, unspecified site: Secondary | ICD-10-CM | POA: Diagnosis present

## 2018-01-01 DIAGNOSIS — J449 Chronic obstructive pulmonary disease, unspecified: Secondary | ICD-10-CM | POA: Diagnosis present

## 2018-01-01 DIAGNOSIS — Z992 Dependence on renal dialysis: Secondary | ICD-10-CM | POA: Diagnosis not present

## 2018-01-01 DIAGNOSIS — Z7982 Long term (current) use of aspirin: Secondary | ICD-10-CM

## 2018-01-01 DIAGNOSIS — I5023 Acute on chronic systolic (congestive) heart failure: Secondary | ICD-10-CM | POA: Diagnosis not present

## 2018-01-01 DIAGNOSIS — N186 End stage renal disease: Secondary | ICD-10-CM | POA: Diagnosis present

## 2018-01-01 DIAGNOSIS — I5022 Chronic systolic (congestive) heart failure: Secondary | ICD-10-CM | POA: Diagnosis present

## 2018-01-01 DIAGNOSIS — Z6826 Body mass index (BMI) 26.0-26.9, adult: Secondary | ICD-10-CM

## 2018-01-01 DIAGNOSIS — Z951 Presence of aortocoronary bypass graft: Secondary | ICD-10-CM

## 2018-01-01 DIAGNOSIS — B182 Chronic viral hepatitis C: Secondary | ICD-10-CM | POA: Diagnosis present

## 2018-01-01 DIAGNOSIS — Z9049 Acquired absence of other specified parts of digestive tract: Secondary | ICD-10-CM

## 2018-01-01 DIAGNOSIS — Z8249 Family history of ischemic heart disease and other diseases of the circulatory system: Secondary | ICD-10-CM

## 2018-01-01 DIAGNOSIS — E785 Hyperlipidemia, unspecified: Secondary | ICD-10-CM | POA: Diagnosis present

## 2018-01-01 DIAGNOSIS — Z7902 Long term (current) use of antithrombotics/antiplatelets: Secondary | ICD-10-CM

## 2018-01-01 DIAGNOSIS — Z8674 Personal history of sudden cardiac arrest: Secondary | ICD-10-CM

## 2018-01-01 DIAGNOSIS — R079 Chest pain, unspecified: Secondary | ICD-10-CM

## 2018-01-01 DIAGNOSIS — Z95810 Presence of automatic (implantable) cardiac defibrillator: Secondary | ICD-10-CM

## 2018-01-01 DIAGNOSIS — E871 Hypo-osmolality and hyponatremia: Secondary | ICD-10-CM | POA: Diagnosis present

## 2018-01-01 DIAGNOSIS — R0789 Other chest pain: Principal | ICD-10-CM | POA: Diagnosis present

## 2018-01-01 DIAGNOSIS — I252 Old myocardial infarction: Secondary | ICD-10-CM

## 2018-01-01 DIAGNOSIS — Z8701 Personal history of pneumonia (recurrent): Secondary | ICD-10-CM

## 2018-01-01 HISTORY — DX: Adverse effect of unspecified anesthetic, initial encounter: T41.45XA

## 2018-01-01 HISTORY — DX: Other complications of anesthesia, initial encounter: T88.59XA

## 2018-01-01 LAB — HEMOGLOBIN A1C
Hgb A1c MFr Bld: 4.7 % — ABNORMAL LOW (ref 4.8–5.6)
Mean Plasma Glucose: 88.19 mg/dL

## 2018-01-01 LAB — COMPREHENSIVE METABOLIC PANEL
ALT: 22 U/L (ref 0–44)
AST: 19 U/L (ref 15–41)
Albumin: 3.4 g/dL — ABNORMAL LOW (ref 3.5–5.0)
Alkaline Phosphatase: 57 U/L (ref 38–126)
Anion gap: 15 (ref 5–15)
BUN: 38 mg/dL — ABNORMAL HIGH (ref 8–23)
CO2: 25 mmol/L (ref 22–32)
Calcium: 8.7 mg/dL — ABNORMAL LOW (ref 8.9–10.3)
Chloride: 90 mmol/L — ABNORMAL LOW (ref 98–111)
Creatinine, Ser: 6.94 mg/dL — ABNORMAL HIGH (ref 0.61–1.24)
GFR calc Af Amer: 9 mL/min — ABNORMAL LOW (ref >60)
GFR calc non Af Amer: 8 mL/min — ABNORMAL LOW (ref >60)
GLUCOSE: 84 mg/dL (ref 70–99)
POTASSIUM: 4.1 mmol/L (ref 3.5–5.1)
Sodium: 130 mmol/L — ABNORMAL LOW (ref 135–145)
TOTAL PROTEIN: 8.8 g/dL — AB (ref 6.5–8.1)
Total Bilirubin: 0.7 mg/dL (ref 0.3–1.2)

## 2018-01-01 LAB — GLUCOSE, CAPILLARY
GLUCOSE-CAPILLARY: 73 mg/dL (ref 70–99)
Glucose-Capillary: 64 mg/dL — ABNORMAL LOW (ref 70–99)
Glucose-Capillary: 116 mg/dL — ABNORMAL HIGH (ref 70–99)
Glucose-Capillary: 64 mg/dL — ABNORMAL LOW (ref 70–99)
Glucose-Capillary: 90 mg/dL (ref 70–99)
Glucose-Capillary: 78 mg/dL (ref 70–99)
Glucose-Capillary: 94 mg/dL (ref 70–99)

## 2018-01-01 LAB — POCT I-STAT 4, (NA,K, GLUC, HGB,HCT)
Glucose, Bld: 75 mg/dL (ref 70–99)
HCT: 36 % — ABNORMAL LOW (ref 39.0–52.0)
Hemoglobin: 12.2 g/dL — ABNORMAL LOW (ref 13.0–17.0)
Potassium: 4.5 mmol/L (ref 3.5–5.1)
Sodium: 127 mmol/L — ABNORMAL LOW (ref 135–145)

## 2018-01-01 LAB — TROPONIN I
TROPONIN I: 0.04 ng/mL — AB (ref <0.03)
TROPONIN I: 0.03 ng/mL — AB (ref <0.03)
Troponin I: 0.04 ng/mL (ref <0.03)

## 2018-01-01 LAB — LIPID PANEL
Cholesterol: 148 mg/dL (ref 0–200)
HDL: 32 mg/dL — ABNORMAL LOW (ref >40)
LDL Cholesterol: 93 mg/dL (ref 0–99)
Total CHOL/HDL Ratio: 4.6 RATIO
Triglycerides: 113 mg/dL (ref <150)
VLDL: 23 mg/dL (ref 0–40)

## 2018-01-01 LAB — SURGICAL PCR SCREEN
MRSA, PCR: NEGATIVE
Staphylococcus aureus: NEGATIVE

## 2018-01-01 SURGERY — REVISION OF ARTERIOVENOUS GORETEX GRAFT
Anesthesia: Monitor Anesthesia Care | Laterality: Right

## 2018-01-01 MED ORDER — CALCIUM ACETATE (PHOS BINDER) 667 MG PO CAPS
2001.0000 mg | ORAL_CAPSULE | Freq: Three times a day (TID) | ORAL | Status: DC
  Administered 2018-01-01 – 2018-01-04 (×8): 2001 mg via ORAL
  Filled 2018-01-01 (×8): qty 3

## 2018-01-01 MED ORDER — AMLODIPINE BESYLATE 5 MG PO TABS
5.0000 mg | ORAL_TABLET | Freq: Every day | ORAL | Status: DC
  Administered 2018-01-01 – 2018-01-02 (×2): 5 mg via ORAL
  Filled 2018-01-01 (×2): qty 1

## 2018-01-01 MED ORDER — ASPIRIN 81 MG PO CHEW: 81.0000 mg | CHEWABLE_TABLET | Freq: Every day | ORAL | Status: DC

## 2018-01-01 MED ORDER — MORPHINE SULFATE (PF) 2 MG/ML IV SOLN
INTRAVENOUS | Status: AC
  Filled 2018-01-01: qty 1

## 2018-01-01 MED ORDER — ONDANSETRON HCL 4 MG PO TABS
4.0000 mg | ORAL_TABLET | Freq: Four times a day (QID) | ORAL | Status: DC | PRN
  Administered 2018-01-04: 4 mg via ORAL
  Filled 2018-01-01: qty 1

## 2018-01-01 MED ORDER — ASPIRIN 81 MG PO CHEW
CHEWABLE_TABLET | ORAL | Status: AC
  Filled 2018-01-01: qty 4

## 2018-01-01 MED ORDER — NITROGLYCERIN 0.4 MG SL SUBL
0.4000 mg | SUBLINGUAL_TABLET | SUBLINGUAL | Status: AC
  Administered 2018-01-01: 0.4 mg via SUBLINGUAL
  Filled 2018-01-01: qty 1

## 2018-01-01 MED ORDER — MORPHINE SULFATE (PF) 2 MG/ML IV SOLN
1.0000 mg | Freq: Once | INTRAVENOUS | Status: AC
  Administered 2018-01-01: 1 mg via INTRAVENOUS

## 2018-01-01 MED ORDER — SODIUM CHLORIDE 0.9 % IV SOLN
INTRAVENOUS | Status: DC
  Administered 2018-01-01 – 2018-01-03 (×2): via INTRAVENOUS

## 2018-01-01 MED ORDER — DEXTROSE 50 % IV SOLN
25.0000 mL | Freq: Once | INTRAVENOUS | Status: AC
  Administered 2018-01-01: 25 mL via INTRAVENOUS

## 2018-01-01 MED ORDER — INSULIN ASPART 100 UNIT/ML ~~LOC~~ SOLN: 0.0000 [IU] | Freq: Every day | SUBCUTANEOUS | Status: DC

## 2018-01-01 MED ORDER — NITROGLYCERIN 0.4 MG SL SUBL
0.4000 mg | SUBLINGUAL_TABLET | SUBLINGUAL | Status: AC | PRN
  Administered 2018-01-01 (×2): 0.4 mg via SUBLINGUAL
  Filled 2018-01-01 (×2): qty 1

## 2018-01-01 MED ORDER — CHLORHEXIDINE GLUCONATE CLOTH 2 % EX PADS
6.0000 | MEDICATED_PAD | Freq: Once | CUTANEOUS | Status: AC
  Administered 2018-01-01: 6 via TOPICAL

## 2018-01-01 MED ORDER — ROSUVASTATIN CALCIUM 20 MG PO TABS
20.0000 mg | ORAL_TABLET | Freq: Every day | ORAL | Status: DC
  Administered 2018-01-01 – 2018-01-04 (×4): 20 mg via ORAL
  Filled 2018-01-01 (×4): qty 1

## 2018-01-01 MED ORDER — NITROGLYCERIN 0.4 MG SL SUBL
SUBLINGUAL_TABLET | SUBLINGUAL | Status: AC
  Administered 2018-01-01: 0.4 mg via SUBLINGUAL
  Filled 2018-01-01: qty 1

## 2018-01-01 MED ORDER — INSULIN ASPART 100 UNIT/ML ~~LOC~~ SOLN: 0.0000 [IU] | Freq: Three times a day (TID) | SUBCUTANEOUS | Status: DC

## 2018-01-01 MED ORDER — CINACALCET HCL 30 MG PO TABS
30.0000 mg | ORAL_TABLET | Freq: Every day | ORAL | Status: DC
  Administered 2018-01-01 – 2018-01-04 (×4): 30 mg via ORAL
  Filled 2018-01-01 (×4): qty 1

## 2018-01-01 MED ORDER — PANTOPRAZOLE SODIUM 40 MG PO TBEC
40.0000 mg | DELAYED_RELEASE_TABLET | Freq: Every day | ORAL | Status: DC
  Administered 2018-01-01 – 2018-01-04 (×4): 40 mg via ORAL
  Filled 2018-01-01 (×4): qty 1

## 2018-01-01 MED ORDER — ONDANSETRON HCL 4 MG/2ML IJ SOLN: 4.0000 mg | Freq: Four times a day (QID) | INTRAMUSCULAR | Status: DC | PRN

## 2018-01-01 MED ORDER — ASPIRIN 81 MG PO CHEW
324.0000 mg | CHEWABLE_TABLET | ORAL | Status: AC
  Administered 2018-01-01: 324 mg via ORAL
  Filled 2018-01-01: qty 4

## 2018-01-01 MED ORDER — ACETAMINOPHEN 325 MG PO TABS
650.0000 mg | ORAL_TABLET | Freq: Four times a day (QID) | ORAL | Status: DC | PRN
  Administered 2018-01-01 – 2018-01-04 (×2): 650 mg via ORAL
  Filled 2018-01-01 (×2): qty 2

## 2018-01-01 MED ORDER — HYDRALAZINE HCL 10 MG PO TABS: 10.0000 mg | ORAL_TABLET | Freq: Every day | ORAL | Status: DC | PRN

## 2018-01-01 MED ORDER — CEFAZOLIN SODIUM-DEXTROSE 2-4 GM/100ML-% IV SOLN
2.0000 g | INTRAVENOUS | Status: DC
  Filled 2018-01-01 (×2): qty 100

## 2018-01-01 MED ORDER — HEPARIN SODIUM (PORCINE) 5000 UNIT/ML IJ SOLN
5000.0000 [IU] | Freq: Two times a day (BID) | INTRAMUSCULAR | Status: DC
  Administered 2018-01-01 – 2018-01-04 (×7): 5000 [IU] via SUBCUTANEOUS
  Filled 2018-01-01 (×7): qty 1

## 2018-01-01 MED ORDER — DEXTROSE 50 % IV SOLN
INTRAVENOUS | Status: AC
  Filled 2018-01-01: qty 50

## 2018-01-01 MED ORDER — RENA-VITE PO TABS
1.0000 | ORAL_TABLET | Freq: Every day | ORAL | Status: DC
  Administered 2018-01-01 – 2018-01-04 (×4): 1 via ORAL
  Filled 2018-01-01 (×4): qty 1

## 2018-01-01 MED ORDER — ASPIRIN EC 81 MG PO TBEC
81.0000 mg | DELAYED_RELEASE_TABLET | Freq: Every day | ORAL | Status: DC
  Administered 2018-01-02 – 2018-01-04 (×3): 81 mg via ORAL
  Filled 2018-01-01 (×4): qty 1

## 2018-01-01 NOTE — Progress Notes (Signed)
Cardiology at bedside.

## 2018-01-01 NOTE — Progress Notes (Signed)
   Patient presented for revision of right upper extremity AV fistula.  He is having chest pain with heaviness in preoperative area and will be evaluated by cardiology.  Brandon C. Donzetta Matters, MD [facility] Office: [phone] Pager: [phone]

## 2018-01-01 NOTE — Progress Notes (Signed)
Report called to nurse on 4 E

## 2018-01-01 NOTE — Anesthesia Preprocedure Evaluation (Addendum)
Anesthesia Evaluation  Patient identified by MRN, date of birth, ID band Patient awake    Reviewed: Allergy & Precautions, H&P , NPO status , Patient's Chart, lab work & pertinent test results  Airway Mallampati: II  TM Distance: >3 FB Neck ROM: Full    Dental no notable dental hx. (+) Poor Dentition, Dental Advisory Given   Pulmonary neg pulmonary ROS,    Pulmonary exam normal        Cardiovascular hypertension, + Past MI and +CHF  + Cardiac Defibrillator + Valvular Problems/Murmurs MR  Rhythm:Regular Rate:Normal + Systolic murmurs    Neuro/Psych negative neurological ROS  negative psych ROS   GI/Hepatic negative GI ROS, Neg liver ROS,   Endo/Other  diabetes  Renal/GU ESRF and DialysisRenal disease  negative genitourinary   Musculoskeletal   Abdominal   Peds  Hematology negative hematology ROS (+)   Anesthesia Other Findings   Reproductive/Obstetrics negative OB ROS                            Anesthesia Physical Anesthesia Plan  ASA: III  Anesthesia Plan: MAC   Post-op Pain Management:    Induction: Intravenous  PONV Risk Score and Plan: 2 and Propofol infusion, Midazolam and Ondansetron  Airway Management Planned: Simple Face Mask  Additional Equipment:   Intra-op Plan:   Post-operative Plan:   Informed Consent: I have reviewed the patients History and Physical, chart, labs and discussed the procedure including the risks, benefits and alternatives for the proposed anesthesia with the patient or authorized representative who has indicated his/her understanding and acceptance.   Dental advisory given  Plan Discussed with: CRNA  Anesthesia Plan Comments:         Anesthesia Quick Evaluation

## 2018-01-01 NOTE — Progress Notes (Signed)
Pt c/o 9/10 chest pain, "very heavy." BP 158/90, P 74, O2 100% 3L, R 16.  Dr. Ola Spurr updated, orders to give 3rd dose of Nitro SL and 1mg  Morphine IV.

## 2018-01-01 NOTE — H&P (Addendum)
[facility]                                                                                    Patient Demographics  Eric [name], is a 62 y.o. male  CSN: [id number]  MRN: [id number]  DOB - 1955-10-12  Admit Date - 01/01/2018  Outpatient Primary MD for the patient is [name], NP   With History of -  Past Medical History:  Diagnosis Date  . AICD (automatic cardioverter/defibrillator) present   . 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 CHF (congestive heart failure) ([place])   . CKD (chronic kidney disease), stage III ([place])   . Complication of anesthesia    got too much anesthesia and bp dropped very low Feb, 2019  . Edema   . ESRD on dialysis ([place])   . History of blood transfusion 1950's   "related to pinal menigitis; HAD 16 OPERATIONS TOTAL"  . Hyperlipemia   . Hypertension   . Ileus ([place]) 05/2015  . Myocardial infarction ([place]) 03/2013  . Obesity   . Pneumonia 2016  . Scabies infestation 06/29/2014  . Shortness of breath dyspnea   . Type II diabetes mellitus ([place])    type 2  . Ventricular tachycardia [facility])       Past Surgical History:  Procedure Laterality Date  . AV FISTULA PLACEMENT Right 03/18/2015   Procedure: BRACHIOCEPHALIC ARTERIOVENOUS (AV) FISTULA CREATION;  Surgeon: Angelia Mould, MD;  Location: [facility];  Service: Vascular;  Laterality: Right;  . BACK SURGERY  1950's   "for spinal menigitis; HAD 16 OPERATIONS TOTAL"  . CARDIAC CATHETERIZATION    . CHOLECYSTECTOMY N/A 06/17/2015   Procedure: LAPAROSCOPIC CHOLECYSTECTOMY;  Surgeon: Coralie Keens, MD;  Location: [facility];  Service: General;  Laterality: N/A;  . CORONARY ARTERY BYPASS GRAFT  1999   cabg x4  . 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 HEAD SURGERY  1950'S   "for spinal menigitis; HAD 16 OPERATIONS TOTAL"  . IMPLANTABLE CARDIOVERTER DEFIBRILLATOR IMPLANT  03/24/2013   STJ single chamber ICD implanted by Dr  Caryl Comes for secondary prevention  . IMPLANTABLE CARDIOVERTER DEFIBRILLATOR IMPLANT N/A 03/24/2013   Procedure: IMPLANTABLE CARDIOVERTER DEFIBRILLATOR IMPLANT;  Surgeon: Deboraha Sprang, MD;  Location: [facility] CATH LAB;  Service: Cardiovascular;  Laterality: N/A;  . INSERTION OF DIALYSIS CATHETER N/A 03/13/2015   Procedure: INSERTION OF TUNNELED DIALYSIS CATHETER;  Surgeon: Conrad Jennette, MD;  Location: [facility];  Service: Vascular;  Laterality: N/A;  . LEFT HEART CATHETERIZATION WITH CORONARY/GRAFT ANGIOGRAM  03/07/2013   Procedure: LEFT HEART CATHETERIZATION WITH CORONARY/GRAFT ANGIOGRAM;  Surgeon: Clent Demark, MD;  Location: [facility] CATH LAB;  Service: Cardiovascular;;  . UMBILICAL HERNIA REPAIR N/A 06/24/2015   Procedure: HERNIA REPAIR UMBILICAL ADULT;  Surgeon: Georganna Skeans, MD;  Location: [facility];  Service: General;  Laterality: N/A;    in for   No chief complaint on file.    HPI  Thailan Sava  is a 62 y.o. male, with past medical history significant for congestive heart failure, systolic, status post defibrillator placement, history of end-stage renal disease on  hemodialysis who was admitted today to the hospital for revision of right upper extremity fistula.  Patient started having chest pressure while having IV fluids administered.  No associated nausea vomiting but there was mild shortness of breath.  When I saw the patient around 1 hour later he was having the same symptoms but he asked for something to eat.  Patient denies dizziness or loss of consciousness.  His surgery was canceled and cardiology will see patient and evaluate.  EKG reviewed with no acute changes.    Review of Systems    In addition to the HPI above, No Fever-chills, No Headache, No changes with Vision or hearing, No problems swallowing food or Liquids, No Abdominal pain, No Nausea or Vommitting, Bowel movements are regular, No Blood in stool or Urine, No dysuria, No new skin rashes or bruises, No new joints pains-aches,   No new weakness, tingling, numbness in any extremity, No recent weight gain or loss, No polyuria, polydypsia or polyphagia, No significant Mental Stressors.  A full 10 point Review of Systems was done, except as stated above, all other Review of Systems were negative.   Social History Social History   Tobacco Use  . Smoking status: Never Smoker  . Smokeless tobacco: Never Used  Substance Use Topics  . Alcohol use: No    Alcohol/week: 0.0 standard drinks     Family History Family History  Problem Relation Age of Onset  . Heart disease Mother   . Diabetes Mother      Prior to Admission medications   Medication Sig Start Date End Date Taking? Authorizing Provider  acetaminophen (TYLENOL) 325 MG tablet Take 2 tablets (650 mg total) by mouth every 6 (six) hours as needed for mild pain or moderate pain. 07/01/15  Yes Smiley Houseman, MD  aspirin EC 81 MG tablet Take 81 mg by mouth daily.    Yes [provider]  calcium acetate (PHOSLO) 667 MG capsule Take 2 capsules (1,334 mg total) by mouth 3 (three) times daily with meals. Patient taking differently: Take 2,001 mg by mouth 3 (three) times daily with meals.  03/20/15  Yes Mikhail, Velta Addison, DO  cinacalcet (SENSIPAR) 30 MG tablet Take 30 mg by mouth daily with supper.   Yes [provider]  clopidogrel (PLAVIX) 75 MG tablet Take 75 mg by mouth daily.   Yes [provider]  hydrALAZINE (APRESOLINE) 10 MG tablet Take 10 mg by mouth daily as needed (anxiety).   Yes [provider]  multivitamin (RENA-VIT) TABS tablet Take 1 tablet by mouth at bedtime. 07/01/15  Yes Smiley Houseman, MD  pantoprazole (PROTONIX) 40 MG tablet Take 40 mg by mouth daily.    Yes [provider]    Allergies  Allergen Reactions  . Gabapentin Diarrhea    diarhea    Physical Exam  Vitals  Blood pressure (!) 158/84, pulse 79, temperature 98.4 F (36.9 C), temperature source Oral, resp. rate 17, height  5\' 6"  (1.676 m), weight 73.9 kg, SpO2 100 %.   1. General Young male, chronically ill, extremely pleasant  2. Normal affect and insight, Not Suicidal or Homicidal, Awake Alert, Oriented X 3.  3. No F.N deficits, grossly , patient moving all extremities.  4. Ears and Eyes appear Normal, Conjunctivae clear, PERRLA. Moist Oral Mucosa.  5. Supple Neck, No JVD, No cervical lymphadenopathy appriciated, No Carotid Bruits.  6. Symmetrical Chest wall movement, Good air movement bilaterally, CTAB.  7. RRR, No Gallops, positive systolic murmur.  8. Positive Bowel Sounds, Abdomen Soft, Non tender.  9.  No Cyanosis, Normal Skin Turgor, No Skin Rash or Bruise right arm fistula.  10. Good muscle tone,  joints appear normal , no effusions, Normal ROM.    Data Review  CBC Recent Labs  Lab 01/01/18 0941  HGB 12.2*  HCT 36.0*   ------------------------------------------------------------------------------------------------------------------  Chemistries  Recent Labs  Lab 01/01/18 0941 01/01/18 1117  NA 127* 130*  K 4.5 4.1  CL  --  90*  CO2  --  25  GLUCOSE 75 84  BUN  --  38*  CREATININE  --  6.94*  CALCIUM  --  8.7*  AST  --  19  ALT  --  22  ALKPHOS  --  57  BILITOT  --  0.7   ------------------------------------------------------------------------------------------------------------------ estimated creatinine clearance is 9.7 mL/min (A) (by C-G formula based on SCr of 6.94 mg/dL (H)). ------------------------------------------------------------------------------------------------------------------ No results for input(s): TSH, T4TOTAL, T3FREE, THYROIDAB in the last 72 hours.  Invalid input(s): FREET3   Coagulation profile No results for input(s): INR, PROTIME in the last 168 hours. ------------------------------------------------------------------------------------------------------------------- No results for input(s): DDIMER in the last 72  hours. -------------------------------------------------------------------------------------------------------------------  Cardiac Enzymes Recent Labs  Lab 01/01/18 1117  TROPONINI 0.04*   ------------------------------------------------------------------------------------------------------------------ Invalid input(s): POCBNP   ---------------------------------------------------------------------------------------------------------------  Urinalysis    Component Value Date/Time   COLORURINE YELLOW 03/12/2015 0020   APPEARANCEUR CLEAR 03/12/2015 0020   LABSPEC 1.015 03/12/2015 0020   PHURINE 5.5 03/12/2015 0020   GLUCOSEU NEGATIVE 03/12/2015 0020   HGBUR SMALL (A) 03/12/2015 0020   BILIRUBINUR NEGATIVE 03/12/2015 0020   KETONESUR NEGATIVE 03/12/2015 0020   PROTEINUR >300 (A) 03/12/2015 0020   UROBILINOGEN 1.0 08/03/2014 1855   NITRITE NEGATIVE 03/12/2015 0020   LEUKOCYTESUR NEGATIVE 03/12/2015 0020    ----------------------------------------------------------------------------------------------------------------  Imaging results:   No results found.  My personal review of EKG: Rhythm NSR, 78 bpm with left atrial enlargement and left anterior fascicular block  Assessment & Plan  Chest pain/ACS Slightly elevated troponin  0.04 , probably due to renal insufficiency EKG shows no acute changes Pain sounds more like congestion Cardiology on consult Continue with aspirin hold Plavix for now due to surgery Serial troponins Stress test versus echocardiogram to be decided by cardiology   Congestive heart failure, systolic with last echo in 2017 showing ejection fraction of 45 to 50%.fluid management with hemodialysis patient is status post cardiopulmonary arrest with V. fib and placement of defibrillator at this time; continue with aspirin, Plavix per cardiology  Hypertension; continue with hydralazine  End-stage renal disease on hemodialysis Patient originally  admitted for revision of right upper extremity fistula Continue with Sensipar and PhosLo  Diabetes mellitus; Insulin sliding scale  History of COPD; not on neb treatments  History of hyperlipidemia; not on medications Check lipid profile      DVT Prophylaxis Lovenox  AM Labs Ordered, also please review Full Orders  Family Communication: Admission, patients condition and plan of care including tests being ordered have been discussed with the patient and wife who indicate understanding and agree with the plan and Code Status.  Code Status full  Disposition Plan: Home  Time spent in minutes : 40 minutes  Condition GUARDED   @SIGNATURE @

## 2018-01-01 NOTE — Consult Note (Addendum)
Reason for Consult: Chest pain Referring Physician: VVS  Kaden Daughdrill is an 62 y.o. male.  HPI:   62 year old American male with the artery disease status post CABG (LIMA-LAD, SVG-OM) and RCA not amenable to PCI, status post ICD VF cardiac arrest in 2015, HFpEF, hypertension, ESRD on HD, chronic hepatitis C and cirrhosis. Patient was admitted to short stay with plans for repair of aneurysmal RUE AV fistula.  Patient reported chest pain which prompted cardiology consult.  Patient is a poor historian history was obtained from wife and daughter bedside, along with chart review. Patient reports having constant chest heaviness since 8 pm last night. Pain has persisted this morning, not responding to 3 SL NTG. Pain is worse with deep breathing, as well as pressing on his chest. He also reports generalized abdominal pain as as well "leg pain".  At baseline, patient walks with a walker without any significant chest pain. He moved from [place] back to [place] in 2018. Wife and daughter report that he had similar hospital course in [facility] when he was admitted for AV fistula repair. I do not have any records from this episode, however it sounds like he had some sort of ? perioperative complication that required long hospital stay followed by rehab. Patient and wife very concerned about this episode and worry about repeat of similar episode.   Patient was previously seen by various cardiologist here in [place]. He was most recently followed by Dr. Catalina Lunger in [place]. He now wants to reestablish care with [place].   Past Medical History:  Diagnosis Date  . AICD (automatic cardioverter/defibrillator) present   . Cardiac arrest due to underlying cardiac condition ([place])   . CHF (congestive heart failure) ([place])   . CKD (chronic kidney disease), stage III ([place])   . Complication of anesthesia    got too much anesthesia and bp dropped very low Feb, 2019  . Edema   . ESRD on dialysis ([place])    . History of blood transfusion 1950's   "related to pinal menigitis; HAD 16 OPERATIONS TOTAL"  . Hyperlipemia   . Hypertension   . Ileus ([place]) 05/2015  . Myocardial infarction ([place]) 03/2013  . Obesity   . Pneumonia 2016  . Scabies infestation 06/29/2014  . Shortness of breath dyspnea   . Type II diabetes mellitus ([place])    type 2  . Ventricular tachycardia [facility])     Past Surgical History:  Procedure Laterality Date  . AV FISTULA PLACEMENT Right 03/18/2015   Procedure: BRACHIOCEPHALIC ARTERIOVENOUS (AV) FISTULA CREATION;  Surgeon: Angelia Mould, MD;  Location: [place];  Service: Vascular;  Laterality: Right;  . BACK SURGERY  1950's   "for spinal menigitis; HAD 16 OPERATIONS TOTAL"  . CARDIAC CATHETERIZATION    . CHOLECYSTECTOMY N/A 06/17/2015   Procedure: LAPAROSCOPIC CHOLECYSTECTOMY;  Surgeon: Coralie Keens, MD;  Location: [place];  Service: General;  Laterality: N/A;  . CORONARY ARTERY BYPASS GRAFT  1999   cabg x4  . 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 HEAD SURGERY  1950'S   "for spinal menigitis; HAD 16 OPERATIONS TOTAL"  . IMPLANTABLE CARDIOVERTER DEFIBRILLATOR IMPLANT  03/24/2013   STJ single chamber ICD implanted by Dr Caryl Comes for secondary prevention  . IMPLANTABLE CARDIOVERTER DEFIBRILLATOR IMPLANT N/A 03/24/2013   Procedure: IMPLANTABLE CARDIOVERTER DEFIBRILLATOR IMPLANT;  Surgeon: Deboraha Sprang, MD;  Location: [facility] CATH LAB;  Service: Cardiovascular;  Laterality: N/A;  . INSERTION OF DIALYSIS CATHETER N/A  03/13/2015   Procedure: INSERTION OF TUNNELED DIALYSIS CATHETER;  Surgeon: Conrad Kemah, MD;  Location: [place];  Service: Vascular;  Laterality: N/A;  . LEFT HEART CATHETERIZATION WITH CORONARY/GRAFT ANGIOGRAM  03/07/2013   Procedure: LEFT HEART CATHETERIZATION WITH CORONARY/GRAFT ANGIOGRAM;  Surgeon: Clent Demark, MD;  Location: [place] CATH LAB;  Service: Cardiovascular;;  . UMBILICAL HERNIA REPAIR N/A 06/24/2015   Procedure: HERNIA  REPAIR UMBILICAL ADULT;  Surgeon: Georganna Skeans, MD;  Location: [facility] OR;  Service: General;  Laterality: N/A;    Family History  Problem Relation Age of Onset  . Heart disease Mother   . Diabetes Mother     Social History:  reports that he has never smoked. He has never used smokeless tobacco. He reports that he does not drink alcohol or use drugs.  Allergies:  Allergies  Allergen Reactions  . Gabapentin Diarrhea    diarhea    Medications: I have reviewed the patient's current medications.  Results for orders placed or performed during the hospital encounter of 01/01/18 (from the past 48 hour(s))  Glucose, capillary     Status: Abnormal   Collection Time: 01/01/18  9:18 AM  Result Value Ref Range   Glucose-Capillary 64 (L) 70 - 99 mg/dL   Comment 1 Notify RN    Comment 2 Document in Chart   I-STAT 4, (NA,K, GLUC, HGB,HCT)     Status: Abnormal   Collection Time: 01/01/18  9:41 AM  Result Value Ref Range   Sodium 127 (L) 135 - 145 mmol/L   Potassium 4.5 3.5 - 5.1 mmol/L   Glucose, Bld 75 70 - 99 mg/dL   HCT 36.0 (L) 39.0 - 52.0 %   Hemoglobin 12.2 (L) 13.0 - 17.0 g/dL  Glucose, capillary     Status: Abnormal   Collection Time: 01/01/18 10:06 AM  Result Value Ref Range   Glucose-Capillary 116 (H) 70 - 99 mg/dL   Comment 1 Notify RN    Comment 2 Document in Chart   Hemoglobin A1c     Status: Abnormal   Collection Time: 01/01/18 10:07 AM  Result Value Ref Range   Hgb A1c MFr Bld 4.7 (L) 4.8 - 5.6 %    Comment: (NOTE) Pre diabetes:          5.7%-6.4% Diabetes:              >6.4% Glycemic control for   <7.0% adults with diabetes    Mean Plasma Glucose 88.19 mg/dL    Comment: Performed at [facility], [address] [place], [place] [address]    Cardiac studies: EKG 01/01/2018: Sinus rhythm, LAE, LAFB. No acute ischemic changes.   Nuclear stress test 06/16/2015: 1. Suspected mild prior infarction involving the mid and basilar aspects of the inferior wall of  the left ventricle. 2. No definitive scintigraphic evidence of superimposed pharmacologically induced ischemia. 3. Mild hypokinesia involving the inferior wall of the left ventricle at the location of suspected prior myocardial infarction. Otherwise, normal wall motion. Ejection fraction - 51%.  Echocardiogram 03/11/2015: - Left ventricle: The cavity size was normal. There was moderate   concentric hypertrophy. Systolic function was mildly reduced. The   estimated ejection fraction was in the range of 45% to 50%.   Echo-contrast was used for LV function. Hypokinesis of the   inferolateral myocardium. Hypokinesis of the inferior myocardium.   Left ventricular diastolic function parameters were normal. - Mitral valve: Consider papillary muscle dysfunction.   Normal-sized, calcified annulus. Mobility of  the posterior   leaflet was mildly restricted. There was moderate to severe   regurgitation directed posteriorly. - Left atrium: The atrium was mildly dilated. - Pulmonary arteries: Systolic pressure was mildly to moderately   increased. PA peak pressure: 47 mm Hg (S).  Impressions:  - The right ventricular systolic pressure was increased consistent   with moderate pulmonary hypertension.   Nuclear stress test 11/27/2013: 1. No scintigraphic evidence of prior infarction or pharmacologically induced ischemia.  2. Mild hypokinesia involving the basilar aspect of the inferior wall and septum, possibly the sequela of prior median sternotomy and CABG. Otherwise, normal wall motion  3.  Left ventricular ejection fraction 49%  4.  Low-risk stress test findings*.  Cath 03/08/2013 (Dr. Terrence Dupont): LM: Normal LAD: 100% prox occluded Cx: 100% prox occluded RCA: small PDA 80-90% stenosis, PLV 50-60% stenosis LIMA-LAD: Patent. Diffuse disease in dLAD after LIMA-LAD touchdown SVG-OM: Patent. Diffuse distal disease   No results found.  Review of Systems  HENT: Negative.   Eyes:  Negative.   Respiratory: Positive for shortness of breath.   Cardiovascular: Positive for chest pain. Negative for leg swelling.  Gastrointestinal: Positive for abdominal pain. Negative for nausea and vomiting.  Genitourinary: Negative.   Musculoskeletal:       Bilateral leg pain  Skin: Negative.   Neurological: Negative for dizziness and loss of consciousness.       Baseline memory deficit  Psychiatric/Behavioral: Negative.   All other systems reviewed and are negative.  Blood pressure (!) 160/78, pulse 75, temperature 98.4 F (36.9 C), temperature source Oral, resp. rate 14, height 5\' 6"  (1.676 m), weight 73.9 kg, SpO2 100 %. Physical Exam  Nursing note and vitals reviewed. Constitutional: He is oriented to person, place, and time. He appears well-developed and well-nourished. No distress.  HENT:  Head: Normocephalic and atraumatic.  Eyes: Pupils are equal, round, and reactive to light. Conjunctivae are normal.  Neck: Normal range of motion. Neck supple. No JVD present.  Cardiovascular: Normal rate and regular rhythm.  Murmur (Flow murmur RUSB) heard. RUE aneurysmal AV fistula  Respiratory: Effort normal. He has no wheezes. He has no rales. He exhibits tenderness.  Sternotomy and ICD pocket scar  GI: Soft. Bowel sounds are normal. There is no tenderness. There is no rebound.  Musculoskeletal: He exhibits no edema.  Lymphadenopathy:    He has no cervical adenopathy.  Neurological: He is alert and oriented to person, place, and time.  Skin: Skin is warm and dry.  Psychiatric: He has a normal mood and affect.    Assessment/Recommendations: 62 year old American male with the artery disease status post CABG (LIMA-LAD, SVG-OM) and RCA not amenable to PCI, status post ICD VF cardiac arrest in 2015, HFpEF, hypertension, ESRD on HD, chronic hepatitis C and cirrhosis. Patient was admitted to short stay with plans for repair of aneurysmal RUE AV fistula.  Patient reported chest pain which  prompted cardiology consult.  Chest pain/perioperative risk stratification: Patient with known severe CAD. However, his chest pain today appears musculoskeletal in nature. Reviewing older charts, he has had similar presentations before. While MI is unlikely, his wife and daughter are extremely concerned about proceeding with surgery today. It is reasonable to hold the planned AV fistula repair in order to control his blood pressure better and rule put MI with serial cardiac biomarkers. Trop 0.04 is probably nonspecific in him with his ESRD. Will look for rise and fall .  I have reviewed his previous stress tests and cath. I do  not think repeat stress test will change his perioperative risk, which remains high. Perioperative PCI will not reduce cardiac risk. Moreover, he does not have any significant targets for revascularization based on previous caths. Optimal medical therapy with aspirin, statin, and adequate blood pressure control is recommended.   Will readdress the situation in the morning. If MI excluded with serial troponins, could reconsider operative repair while inpatient. Per my discussion with Dr. Donzetta Matters, the surgery is time sensitive given the aneurysmal nature. His surgical benefits likely outweigh the risks at this time. I would add beta blocker, only if surgery is rescheduled at least a week out.   Patient will be admitted to hospitalist service.   Kanaan Kagawa J Piedad Standiford 01/01/2018, 12:09 PM   Trevontae Lindahl Esther Hardy, MD [facility] Pager: [phone] Office: [phone] If no answer Cell [phone]

## 2018-01-01 NOTE — Progress Notes (Signed)
Patient arrived the unit from short stay on a stretcher, placed on tele ccmd notified, patient assessment completed see flow sheet, patient oriented to room and staff, bed in lowest position,no sign of distress noted call bell within reach will continue to monitor.

## 2018-01-01 NOTE — Progress Notes (Signed)
Pt c/o increased WOB, 100% 3L, 8/10 chest pain unresolved with Morphine. ASA order given from Dr. Ola Spurr, administered.  Dr. Donzetta Matters at bedside.

## 2018-01-01 NOTE — Progress Notes (Addendum)
CBG  62  Wife states that CBG was 82 @ home pt. Is Type 2 diabetic but is currently no on any medications.   Requested Anesthesia records from [facility] in [place]. States he almost died from anesthesia in March 18, 2017 during surgery.   Requested cardiology records  from cardiologist Dr. Catalina Lunger in [place]  Pt. Does not have a PCP or cardiologist established in [place].  ICD last checked  10-13-2017 per Peri-Operative orders received In chart.  ICD  St. Jude placed in 2016

## 2018-01-01 NOTE — Progress Notes (Signed)
Awaiting cardiology--called by anesthesia.  Labs in process.

## 2018-01-01 NOTE — Progress Notes (Addendum)
Cardiology PA in to see pt. Pt. States he wants Dr. Einar Gip for cardiologist.   Dr. Virgina Jock in to see patient.Talked with patient,wife and daughter.  Decided to admit patient to rule out  Any cardiac problem before proceeding with surgery.  Lab called critical Troponin level 0.04 Results given to Dr. Dwaine Deter and Dr. Ola Spurr.  Dr. Donzetta Matters called for hospitalist to admit patient.    CBG 62 Dr. Ola Spurr called ordered I/2 amp D-50-W.  Marland Kitchen

## 2018-01-01 NOTE — Progress Notes (Signed)
Called to room by family stating patient was complaining of chest pain at a 7/10. EKG obtained per protocol. VS taken and loaded in flowsheet. Dr. Jefm Petty notified of status change and came to assess patient. MD stated he would call cardiology to come assess patient. SL Nitro ordered and given as ordered. Morphine ordered and given as ordered.

## 2018-01-01 NOTE — Consult Note (Signed)
[facility] Renal Consultation Note    Indication for Consultation:  Management of ESRD/hemodialysis, anemia, hypertension/volume, and secondary hyperparathyroidism. PCP:  HPI: Eric [name] is a 62 y.o. male with ESRD, Type 2 DM, HTN, Hep C, CAD (Hx CABG), Hx VT/fib arrest (AICD in place), HFrEF (EF 45-50%) who was admitted with chest pain.  Presented for scheduled outpatient surgery, revision of scabbed/aneurysmal RUE AVF. Scab has been present for several weeks, not healing. Once arriving at [facility] this morning, he developed acute CP which was not relieved with NTG x 3. EKG without acute ST changes, Trop 0.04. No diaphoresis, nausea, dyspnea. Cardiology has evaluated him, plan is to trend cardiac enzymes and readdress in AM.  Dialyzes on TTS sched at [facility]. He had a full HD yesterday in prep for surgery today. Labs ok from renal standpoint, no volume excess. Next regularly scheduled HD will be Thursday (01/03/18).  Past Medical History:  Diagnosis Date  . AICD (automatic cardioverter/defibrillator) present   . Cardiac arrest due to underlying cardiac condition ([place])   . CHF (congestive heart failure) ([place])   . CKD (chronic kidney disease), stage III ([place])   . Complication of anesthesia    got too much anesthesia and bp dropped very low Feb, 2019  . Edema   . ESRD on dialysis ([place])   . History of blood transfusion 1950's   "related to pinal menigitis; HAD 16 OPERATIONS TOTAL"  . Hyperlipemia   . Hypertension   . Ileus ([place]) 05/2015  . Myocardial infarction ([place]) 03/2013  . Obesity   . Pneumonia 2016  . Scabies infestation 06/29/2014  . Shortness of breath dyspnea   . Type II diabetes mellitus ([place])    type 2  . Ventricular tachycardia [facility])    Past Surgical History:  Procedure Laterality Date  . AV FISTULA PLACEMENT Right 03/18/2015   Procedure: BRACHIOCEPHALIC ARTERIOVENOUS (AV) FISTULA CREATION;  Surgeon: Angelia Mould, MD;  Location: [place];  Service:  Vascular;  Laterality: Right;  . BACK SURGERY  1950's   "for spinal menigitis; HAD 16 OPERATIONS TOTAL"  . CARDIAC CATHETERIZATION    . CHOLECYSTECTOMY N/A 06/17/2015   Procedure: LAPAROSCOPIC CHOLECYSTECTOMY;  Surgeon: Coralie Keens, MD;  Location: [place];  Service: General;  Laterality: N/A;  . CORONARY ARTERY BYPASS GRAFT  1999   cabg x4  . 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 HEAD SURGERY  1950'S   "for spinal menigitis; HAD 16 OPERATIONS TOTAL"  . IMPLANTABLE CARDIOVERTER DEFIBRILLATOR IMPLANT  03/24/2013   STJ single chamber ICD implanted by Dr Caryl Comes for secondary prevention  . IMPLANTABLE CARDIOVERTER DEFIBRILLATOR IMPLANT N/A 03/24/2013   Procedure: IMPLANTABLE CARDIOVERTER DEFIBRILLATOR IMPLANT;  Surgeon: Deboraha Sprang, MD;  Location: [facility] CATH LAB;  Service: Cardiovascular;  Laterality: N/A;  . INSERTION OF DIALYSIS CATHETER N/A 03/13/2015   Procedure: INSERTION OF TUNNELED DIALYSIS CATHETER;  Surgeon: Conrad Ardsley, MD;  Location: [place];  Service: Vascular;  Laterality: N/A;  . LEFT HEART CATHETERIZATION WITH CORONARY/GRAFT ANGIOGRAM  03/07/2013   Procedure: LEFT HEART CATHETERIZATION WITH CORONARY/GRAFT ANGIOGRAM;  Surgeon: Clent Demark, MD;  Location: [place] CATH LAB;  Service: Cardiovascular;;  . UMBILICAL HERNIA REPAIR N/A 06/24/2015   Procedure: HERNIA REPAIR UMBILICAL ADULT;  Surgeon: Georganna Skeans, MD;  Location: [facility] OR;  Service: General;  Laterality: N/A;   Family History  Problem Relation Age of Onset  . Heart disease Mother   . Diabetes Mother  Social History:  reports that he has never smoked. He has never used smokeless tobacco. He reports that he does not drink alcohol or use drugs.  ROS: As per HPI otherwise negative.  Physical Exam: Vitals:   01/01/18 1151 01/01/18 1200 01/01/18 1300 01/01/18 1314  BP: (!) 160/78 138/81 (!) 173/84 (!) 158/84  Pulse: 75 77 81 79  Resp: 14 17 (!) 21 17  Temp:      TempSrc:      SpO2:  100% 100% 100% 100%  Weight:      Height:         General: Well developed, well nourished, in no acute distress. Head: Normocephalic, atraumatic, sclera non-icteric, mucus membranes are moist. Neck: Supple without lymphadenopathy/masses. JVD not elevated. Lungs: Clear bilaterally to auscultation without wheezes, rales, or rhonchi. Breathing is unlabored. Heart: RRR with normal S1, S2. No murmurs, rubs, or gallops appreciated. Abdomen: Soft, non-tender, non-distended with normoactive bowel sounds.  Musculoskeletal:  Strength and tone appear normal for age. Lower extremities: No edema or ischemic changes, no open wounds. Neuro: Alert and oriented X 3. Moves all extremities spontaneously. Psych:  Responds to questions appropriately with a normal affect. Dialysis Access: RUE AVF + thrill, aneurysmal with 2 scabs present (Prox > distal)  Allergies  Allergen Reactions  . Gabapentin Diarrhea    diarhea   Prior to Admission medications   Medication Sig Start Date End Date Taking? Authorizing Provider  acetaminophen (TYLENOL) 325 MG tablet Take 2 tablets (650 mg total) by mouth every 6 (six) hours as needed for mild pain or moderate pain. 07/01/15  Yes Smiley Houseman, MD  aspirin EC 81 MG tablet Take 81 mg by mouth daily.    Yes [provider]  calcium acetate (PHOSLO) 667 MG capsule Take 2 capsules (1,334 mg total) by mouth 3 (three) times daily with meals. Patient taking differently: Take 2,001 mg by mouth 3 (three) times daily with meals.  03/20/15  Yes Mikhail, Velta Addison, DO  cinacalcet (SENSIPAR) 30 MG tablet Take 30 mg by mouth daily with supper.   Yes [provider]  clopidogrel (PLAVIX) 75 MG tablet Take 75 mg by mouth daily.   Yes [provider]  hydrALAZINE (APRESOLINE) 10 MG tablet Take 10 mg by mouth daily as needed (anxiety).   Yes [provider]  multivitamin (RENA-VIT) TABS tablet Take 1 tablet by mouth at bedtime. 07/01/15  Yes Smiley Houseman, MD  pantoprazole (PROTONIX) 40 MG tablet Take 40 mg by mouth daily.    Yes [provider]   Current Facility-Administered Medications  Medication Dose Route Frequency Provider Last Rate Last Dose  . 0.9 %  sodium chloride infusion   Intravenous Continuous Merton Border, MD 10 mL/hr at 01/01/18 [phone]    . acetaminophen (TYLENOL) tablet 650 mg  650 mg Oral Q6H PRN Merton Border, MD      . amLODipine (NORVASC) tablet 5 mg  5 mg Oral Daily Patwardhan, Manish J, MD      . aspirin 81 MG chewable tablet           . aspirin chewable tablet 81 mg  81 mg Oral Daily Patwardhan, Manish J, MD      . aspirin EC tablet 81 mg  81 mg Oral Daily Merton Border, MD      . calcium acetate (PHOSLO) capsule 2,001 mg  2,001 mg Oral TID WC Merton Border, MD      . ceFAZolin (ANCEF) IVPB 2g/100 mL premix  2 g Intravenous 30 min Pre-Op Merton Border, MD      . Chlorhexidine Gluconate Cloth 2 % PADS 6 each  6 each Topical Once Merton Border, MD      . cinacalcet (SENSIPAR) tablet 30 mg  30 mg Oral Q supper Merton Border, MD      . dextrose 50 % solution           . heparin injection 5,000 Units  5,000 Units Subcutaneous BID Merton Border, MD      . hydrALAZINE (APRESOLINE) tablet 10 mg  10 mg Oral Daily PRN Merton Border, MD      . insulin aspart (novoLOG) injection 0-5 Units  0-5 Units Subcutaneous QHS Merton Border, MD      . insulin aspart (novoLOG) injection 0-9 Units  0-9 Units Subcutaneous TID WC Merton Border, MD      . multivitamin (RENA-VIT) tablet 1 tablet  1 tablet Oral QHS Merton Border, MD      . ondansetron (ZOFRAN) tablet 4 mg  4 mg Oral Q6H PRN Merton Border, MD       Or  . ondansetron (ZOFRAN) injection 4 mg  4 mg Intravenous Q6H PRN Merton Border, MD      . pantoprazole (PROTONIX) EC tablet 40 mg  40 mg Oral Daily Merton Border, MD      . rosuvastatin (CRESTOR) tablet 20 mg  20 mg Oral q1800 Nigel Mormon, MD       Labs: Basic Metabolic Panel: Recent Labs  Lab 01/01/18 0941 01/01/18 1117  NA  127* 130*  K 4.5 4.1  CL  --  90*  CO2  --  25  GLUCOSE 75 84  BUN  --  38*  CREATININE  --  6.94*  CALCIUM  --  8.7*   Liver Function Tests: Recent Labs  Lab 01/01/18 1117  AST 19  ALT 22  ALKPHOS 57  BILITOT 0.7  PROT 8.8*  ALBUMIN 3.4*   No results for input(s): LIPASE, AMYLASE in the last 168 hours. No results for input(s): AMMONIA in the last 168 hours. CBC: Recent Labs  Lab 01/01/18 0941  HGB 12.2*  HCT 36.0*   Cardiac Enzymes: Recent Labs  Lab 01/01/18 1117  TROPONINI 0.04*   CBG: Recent Labs  Lab 01/01/18 0918 01/01/18 1006 01/01/18 1300 01/01/18 1340  GLUCAP 64* 116* 64* 90   Dialysis Orders:  TTS at [facility] 3:30hr, BFR 400/DFRA1.5, EDW 72.5kg, 2K/2.5Ca, AVF, no heparin - Mircera 100 q 2 weeks (last 12/9) - Hectoral 23mcg IV q HD - Venofer 50mg  IV weekly  Assessment/Plan: 1.  Chest pain (unstable angina): Trop 0.04 only -- trending. Plan per cardiology. 2.  R AVF ulceration: Revision postponed today, hopefully will be done soon. ?Tomorrow. 3.  ESRD: Usual TTS schedule (full HD yesterday in prep for surg today), next 12/12 unless labs or symptoms change. 4.  Hypertension/volume: BP variable, no edema on exam. 5.  Anemia: Hgb 12.2, just dosed with ESA. 6.  Metabolic bone disease: Ca ok, Phos pending. Continue home meds. 7.  CAD (Hx CABG, HFrEF) 8.  Hx VT/F arrest (AICD in place)  Veneta Penton, Hershal Coria 01/01/2018, 4:03 PM  [facility] Pager: [phone]

## 2018-01-02 DIAGNOSIS — Z992 Dependence on renal dialysis: Secondary | ICD-10-CM

## 2018-01-02 DIAGNOSIS — I249 Acute ischemic heart disease, unspecified: Secondary | ICD-10-CM

## 2018-01-02 DIAGNOSIS — N186 End stage renal disease: Secondary | ICD-10-CM

## 2018-01-02 DIAGNOSIS — T82898A Other specified complication of vascular prosthetic devices, implants and grafts, initial encounter: Secondary | ICD-10-CM

## 2018-01-02 LAB — HIV ANTIBODY (ROUTINE TESTING W REFLEX): HIV Screen 4th Generation wRfx: NONREACTIVE

## 2018-01-02 LAB — GLUCOSE, CAPILLARY
Glucose-Capillary: 69 mg/dL — ABNORMAL LOW (ref 70–99)
Glucose-Capillary: 109 mg/dL — ABNORMAL HIGH (ref 70–99)
Glucose-Capillary: 100 mg/dL — ABNORMAL HIGH (ref 70–99)
Glucose-Capillary: 96 mg/dL (ref 70–99)
Glucose-Capillary: 95 mg/dL (ref 70–99)

## 2018-01-02 LAB — TROPONIN I: Troponin I: 0.03 ng/mL (ref <0.03)

## 2018-01-02 MED ORDER — CHLORHEXIDINE GLUCONATE CLOTH 2 % EX PADS
6.0000 | MEDICATED_PAD | Freq: Every day | CUTANEOUS | Status: DC
  Administered 2018-01-04 – 2018-01-05 (×2): 6 via TOPICAL

## 2018-01-02 NOTE — Progress Notes (Signed)
[facility] Progress Note  Subjective: still having some chest pains, no sob or cough.   Vitals:   01/01/18 1636 01/01/18 2025 01/02/18 0500 01/02/18 0805  BP: (!) 149/82 (!) 159/78 (!) 165/88 (!) 144/81  Pulse: 72 76 75 76  Resp:  18 14 16   Temp: 97.7 F (36.5 C) 97.8 F (36.6 C) 97.8 F (36.6 C) 97.8 F (36.6 C)  TempSrc: Oral Oral Oral Oral  SpO2: 99% 100% 99% 100%  Weight:      Height:        Inpatient medications: . amLODipine  5 mg Oral Daily  . aspirin EC  81 mg Oral Daily  . calcium acetate  2,001 mg Oral TID WC  . cinacalcet  30 mg Oral Q supper  . heparin  5,000 Units Subcutaneous BID  . insulin aspart  0-5 Units Subcutaneous QHS  . insulin aspart  0-9 Units Subcutaneous TID WC  . multivitamin  1 tablet Oral QHS  . pantoprazole  40 mg Oral Daily  . rosuvastatin  20 mg Oral q1800   . sodium chloride 10 mL/hr at 01/01/18 0953  .  ceFAZolin (ANCEF) IV     acetaminophen, hydrALAZINE, ondansetron **OR** ondansetron (ZOFRAN) IV  Iron/TIBC/Ferritin/ %Sat    Component Value Date/Time   IRON 41 (L) 03/11/2015 1534   TIBC 188 (L) 03/11/2015 1534   FERRITIN 122 03/11/2015 1534   IRONPCTSAT 22 03/11/2015 1534    Exam:  alert, no distress, calm  no jvd  chest cta bilat   Cor reg no mrg  Abd soft ntnd no ascites  Ext no edema   NF, Ox 3  RUE AVF+thrill and 2 scabs present  Dialysis: [facility]  3.5h   400/1.5  72.5kg   2/2.5 bath  AVF RUA   Hep none - Mircera 100 q 2 weeks (last 12/9) - Hectoral 75mcg IV q HD - Venofer 50mg  IV weekly  Assessment/Plan: 1. Chest pain (unstable angina): trop's flat and EKG's neg per cardiology, no plan for intervention.  2. R AVF ulceration: Revision on hold for #1, VVS following.  3. ESRD: usual HD TTS. Stable vol/ lytes, plan next HD tomorrow (Thursday). 4. Hypertension/volume: BP variable, no edema on exam. Close to dry 5. Anemia: Hgb 12.2, just dosed with ESA on 99/3. 6. Metabolic bone disease: Ca ok,  Phos pending. Continue home meds. 7. CAD (Hx CABG, HFrEF) 8. 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

## 2018-01-02 NOTE — Plan of Care (Signed)
  Problem: Education: Goal: Knowledge of General Education information will improve Description Including pain rating scale, medication(s)/side effects and non-pharmacologic comfort measures Outcome: Progressing   Problem: Health Behavior/Discharge Planning: Goal: Ability to manage health-related needs will improve Outcome: Progressing   

## 2018-01-02 NOTE — Progress Notes (Signed)
[name] blood sugar 69 this am. Non symptomatic, pt snacking on apple juice, cracker. Reported to day shift RN for recheck.

## 2018-01-02 NOTE — Progress Notes (Addendum)
PROGRESS NOTE  Eric [name] IDP:[id number] DOB: January 22, 1956 DOA: 01/01/2018 PCP: [name], NP  HPI/Recap of past 24 hours: Eric [name]  is a 62 y.o. male, with past medical history significant for congestive heart failure, systolic, status post defibrillator placement, history of end-stage renal disease on hemodialysis Tuesday Thursday Saturday who was admitted to the hospital for revision of right upper extremity fistula.  Patient started having chest pressure while having IV fluids administered.  No associated nausea vomiting but there was mild shortness of breath. His fistula revision was canceled and cardiology was consulted.  EKG reviewed with no acute specific ST changes.   01/02/2018: Patient seen and examined with his daughter and wife at his bedside.  Reports persistent intermittent chest pressure which is located centrally and associated with dyspnea at rest.  Assessment/Plan: Active Problems:   ACS (acute coronary syndrome) (HCC)  Chest pain rule out ACS ACS has not been ruled in Troponins flat and twelve-lead EKG independently reviewed revealed no specific ST-T changes Cardiology was consulted and signed off on 01/02/2018  Right upper extremity fistula malfunction Vascular surgery following Surgery on hold due to chest pain Defer to nephrology to utilize another access for dialysis  Chronic systolic CHF Last 2D echo done on 03/11/2015 revealed LVEF 45 to 50% with moderately increased pulmonary arteries pressure with hypokinesis of the inferior lateral myocardium Continue cardiac medications as recommended by cardiology  Mild pulmonary edema Independently reviewed chest x-ray done on 01/01/2018 which revealed mild cardiomegaly with increase of pulmonary vascularity suggestive of mild pulmonary edema Patient will be hemodialyzed tomorrow  End-stage renal disease on dialysis Tuesday Thursday Saturday HD planned tomorrow 01/03/2018 per nephrology  Coronary  artery disease  Continue aspirin and Crestor  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  The patient will require at least 2 midnights to further evaluate and treat present condition.     DVT Prophylaxis  subcu Lovenox daily   Family Communication:  Daughter and wife at bedside all questions answered to their satisfaction.  Code Status full  Disposition Plan: Home possibly in 1 to 2 days when vascular surgery signs off and chest pain improves.    Objective: Vitals:   01/01/18 2025 01/02/18 0500 01/02/18 0805 01/02/18 1226  BP: (!) 159/78 (!) 165/88 (!) 144/81 (!) 157/103  Pulse: 76 75 76 75  Resp: 18 14 16 14   Temp: 97.8 F (36.6 C) 97.8 F (36.6 C) 97.8 F (36.6 C) 98.1 F (36.7 C)  TempSrc: Oral Oral Oral Oral  SpO2: 100% 99% 100% 100%  Weight:      Height:        Intake/Output Summary (Last 24 hours) at 01/02/2018 1333 Last data filed at 01/02/2018 0500 Gross per 24 hour  Intake 660 ml  Output -  Net 660 ml   Filed Weights   01/01/18 0918  Weight: 73.9 kg    Exam:  . General: 62 y.o. year-old male well developed well nourished.  Appears uncomfortable due to chest tightness.  Alert and oriented x3. . Cardiovascular: Regular rate and rhythm with no rubs or gallops.  No thyromegaly or JVD noted.   Marland Kitchen Respiratory: Clear to auscultation with no wheezes or rales. Good inspiratory effort. . Abdomen: Soft nontender nondistended with normal bowel  sounds x4 quadrants. . Musculoskeletal: Trace lower extremity edema. 2/4 pulses in all 4 extremities. Marland Kitchen Psychiatry: Mood is appropriate for condition and setting   Data Reviewed: CBC: Recent Labs  Lab 01/01/18 0941  HGB 12.2*  HCT 36.0*   Basic  Metabolic Panel: Recent Labs  Lab 01/01/18 0941 01/01/18 1117  NA 127* 130*  K 4.5 4.1  CL  --  90*  CO2  --  25  GLUCOSE 75 84  BUN  --  38*  CREATININE  --  6.94*  CALCIUM  --  8.7*   GFR: Estimated Creatinine Clearance: 9.7 mL/min (A) (by C-G formula based on SCr of 6.94 mg/dL (H)). Liver Function Tests: Recent Labs  Lab 01/01/18 1117  AST 19  ALT 22  ALKPHOS 57  BILITOT 0.7  PROT 8.8*  ALBUMIN 3.4*   No results for input(s): LIPASE, AMYLASE in the last 168 hours. No results for input(s): AMMONIA in the last 168 hours. Coagulation Profile: No results for input(s): INR, PROTIME in the last 168 hours. Cardiac Enzymes: Recent Labs  Lab 01/01/18 1117 01/01/18 1652 01/01/18 2103 01/02/18 0338  TROPONINI 0.04* 0.03* 0.04* 0.03*   BNP (last 3 results) No results for input(s): PROBNP in the last 8760 hours. HbA1C: Recent Labs    01/01/18 1007  HGBA1C 4.7*   CBG: Recent Labs  Lab 01/01/18 2219 01/01/18 2319 01/02/18 0648 01/02/18 0814 01/02/18 1124  GLUCAP 73 94 69* 109* 100*   Lipid Profile: Recent Labs    01/01/18 1652  CHOL 148  HDL 32*  LDLCALC 93  TRIG 113  CHOLHDL 4.6   Thyroid Function Tests: No results for input(s): TSH, T4TOTAL, FREET4, T3FREE, THYROIDAB in the last 72 hours. Anemia Panel: No results for input(s): VITAMINB12, FOLATE, FERRITIN, TIBC, IRON, RETICCTPCT in the last 72 hours. Urine analysis:    Component Value Date/Time   COLORURINE YELLOW 03/12/2015 0020   APPEARANCEUR CLEAR 03/12/2015 0020   LABSPEC 1.015 03/12/2015 0020   PHURINE 5.5 03/12/2015 0020   GLUCOSEU NEGATIVE 03/12/2015 0020   HGBUR SMALL (A) 03/12/2015 0020   BILIRUBINUR NEGATIVE 03/12/2015 0020   KETONESUR NEGATIVE 03/12/2015 0020   PROTEINUR >300 (A) 03/12/2015 0020   UROBILINOGEN 1.0 08/03/2014 1855   NITRITE NEGATIVE 03/12/2015 0020   LEUKOCYTESUR NEGATIVE 03/12/2015 0020   Sepsis Labs: @LABRCNTIP (procalcitonin:4,lacticidven:4)  ) Recent  Results (from the past 240 hour(s))  Surgical PCR screen     Status: None   Collection Time: 01/01/18  5:49 PM  Result Value Ref Range Status   MRSA, PCR NEGATIVE NEGATIVE Final   Staphylococcus aureus NEGATIVE NEGATIVE Final    Comment: (NOTE) The Xpert SA Assay (FDA approved for NASAL specimens in patients 48 years of age and older), is one component of a comprehensive surveillance program. It is not intended to diagnose infection nor to guide or monitor treatment. Performed at [facility], [place] [address], [place], [place] [address]       Studies: Dg Chest Port 1 View  Result Date: 01/01/2018 CLINICAL DATA:  Preoperative evaluation. History of CHF, pneumonia and end-stage renal disease on dialysis. EXAM: PORTABLE CHEST 1 VIEW COMPARISON:  Chest radiograph November 12, 2015 FINDINGS: Cardiac silhouette is mildly enlarged and unchanged. Mediastinal silhouette is not suspicious. Status post median sternotomy for CABG. Single lead LEFT AICD tip projects and RIGHT ventricle. Faint LEFT lung base strandy densities without pleural effusion or focal consolidation. No pneumothorax. Soft tissue planes and included osseous structures are non suspicious. IMPRESSION: Mild  cardiomegaly.  Minimal LEFT lung base atelectasis/scarring. Electronically Signed   By: Elon Alas M.D.   On: 01/01/2018 16:05    Scheduled Meds: . amLODipine  5 mg Oral Daily  . aspirin EC  81 mg Oral Daily  . calcium acetate  2,001 mg Oral TID WC  . Chlorhexidine Gluconate Cloth  6 each Topical Q0600  . cinacalcet  30 mg Oral Q supper  . heparin  5,000 Units Subcutaneous BID  . insulin aspart  0-5 Units Subcutaneous QHS  . insulin aspart  0-9 Units Subcutaneous TID WC  . multivitamin  1 tablet Oral QHS  . pantoprazole  40 mg Oral Daily  . rosuvastatin  20 mg Oral q1800    Continuous Infusions: . sodium chloride 10 mL/hr at 01/01/18 0953  .  ceFAZolin (ANCEF) IV       LOS: 1 day     Kayleen Memos,  MD [facility] Pager [phone]  If 7PM-7AM, please contact night-coverage [email or web address] Password [email or web address] 01/02/2018, 1:33 PM

## 2018-01-02 NOTE — Progress Notes (Addendum)
  Progress Note    01/02/2018 8:22 AM 1 Day Post-Op  Subjective:  Patient complains of persistent heavy pressure in chest   Vitals:   01/02/18 0500 01/02/18 0805  BP: (!) 165/88 (!) 144/81  Pulse: 75 76  Resp: 14 16  Temp: 97.8 F (36.6 C) 97.8 F (36.6 C)  SpO2: 99% 100%   Physical Exam: Lungs:  Non labored Extremities:  Palpable thrill and audible bruit R arm AV fistula; eschar over largest pseudoaneurym remains intact, no drainage Neurologic: A&O  CBC    Component Value Date/Time   WBC 11.9 (H) 11/12/2015 1317   RBC 3.10 (L) 11/12/2015 1317   HGB 12.2 (L) 01/01/2018 0941   HGB 9.9 (L) 07/24/2014 0855   HCT 36.0 (L) 01/01/2018 0941   HCT 28.7 (L) 07/24/2014 0855   PLT 264 11/12/2015 1317   PLT 206 07/24/2014 0855   MCV 93.5 11/12/2015 1317   MCV 90 07/24/2014 0855   MCH 30.0 11/12/2015 1317   MCHC 32.1 11/12/2015 1317   RDW 14.5 11/12/2015 1317   RDW 12.4 07/24/2014 0855   LYMPHSABS 2.9 11/12/2015 1317   LYMPHSABS 1.5 07/24/2014 0855   MONOABS 1.0 11/12/2015 1317   EOSABS 0.1 11/12/2015 1317   EOSABS 0.3 07/24/2014 0855   BASOSABS 0.0 11/12/2015 1317   BASOSABS 0.0 07/24/2014 0855    BMET    Component Value Date/Time   NA 130 (L) 01/01/2018 1117   K 4.1 01/01/2018 1117   CL 90 (L) 01/01/2018 1117   CO2 25 01/01/2018 1117   GLUCOSE 84 01/01/2018 1117   BUN 38 (H) 01/01/2018 1117   CREATININE 6.94 (H) 01/01/2018 1117   CALCIUM 8.7 (L) 01/01/2018 1117   GFRNONAA 8 (L) 01/01/2018 1117   GFRAA 9 (L) 01/01/2018 1117    INR    Component Value Date/Time   INR 1.58 (H) 06/14/2015 1355     Intake/Output Summary (Last 24 hours) at 01/02/2018 0240 Last data filed at 01/02/2018 0500 Gross per 24 hour  Intake 660 ml  Output -  Net 660 ml     Assessment/Plan:  62 y.o. male  With fistula revision surgery cancelled 01/01/18 due to chest pain/pressure  R arm fistula with pseudoaneurysm degeneration and overlying eschar, no bleeding or  drainage Persistent chest pressure; nursing staff paging Cardiology Will reschedule R arm fistula revision/plication when chest pain/pressure resolved Continue to avoid ulcerated area of fistula during HD  Dagoberto Ligas, PA-C [facility] [phone] 01/02/2018 8:22 AM   I have independently interviewed and examined patient and agree with PA assessment and plan above.  Tentative plan for OR tomorrow given no intervention planned by cardiology.  I do not think it would be a good idea to wait for patient to be an outpatient given that I believe he is at high risk of having erosion of the fistula which would be a life-threatening event.  I discussed with the family and the patient and they agreed to proceed.  N.p.o. past midnight.  Mattie Nordell C. Donzetta Matters, MD [facility] [place] Office: [phone] Pager: [phone]

## 2018-01-02 NOTE — Progress Notes (Addendum)
Subjective:  Patient continues to have chest and abdominal heaviness. No EKG changes. Flat troponins  Objective:  Vital Signs in the last 24 hours: Temp:  [97.7 F (36.5 C)-98.4 F (36.9 C)] 97.8 F (36.6 C) (12/11 0805) Pulse Rate:  [72-81] 76 (12/11 0805) Resp:  [14-21] 16 (12/11 0805) BP: (126-189)/(71-97) 144/81 (12/11 0805) SpO2:  [99 %-100 %] 100 % (12/11 0805) Weight:  [73.9 kg] 73.9 kg (12/10 0918)  Intake/Output from previous day: 12/10 0701 - 12/11 0700 In: 660 [P.O.:660] Out: -  Intake/Output from this shift: No intake/output data recorded.  Physical Exam: Nursing note and vitals reviewed. Constitutional: He is oriented to person, place, and time. He appears well-developed and well-nourished. No distress.  HENT:  Head: Normocephalic and atraumatic.  Eyes: Pupils are equal, round, and reactive to light. Conjunctivae are normal.  Neck: Normal range of motion. Neck supple. No JVD present.  Cardiovascular: Normal rate and regular rhythm.  Murmur (Flow murmur RUSB) heard. RUE aneurysmal AV fistula  Respiratory: Effort normal. He has no wheezes. He has no rales. He exhibits tenderness.  Sternotomy and ICD pocket scar  GI: Soft. Bowel sounds are normal. There is no tenderness. There is no rebound.  Musculoskeletal: He exhibits no edema.  Lymphadenopathy:    He has no cervical adenopathy.  Neurological: He is alert and oriented to person, place, and time.  Skin: Skin is warm and dry.  Psychiatric: He has a normal mood and affect.    Lab Results: Recent Labs    01/01/18 0941  HGB 12.2*   Recent Labs    01/01/18 0941 01/01/18 1117  NA 127* 130*  K 4.5 4.1  CL  --  90*  CO2  --  25  GLUCOSE 75 84  BUN  --  38*  CREATININE  --  6.94*   Recent Labs    01/01/18 2103 01/02/18 0338  TROPONINI 0.04* 0.03*   Hepatic Function Panel Recent Labs    01/01/18 1117  PROT 8.8*  ALBUMIN 3.4*  AST 19  ALT 22  ALKPHOS 57  BILITOT 0.7   Recent Labs     01/01/18 1652  CHOL 148   Cardiac studies: EKG 01/01/2018: Sinus rhythm, LAE, LAFB. No acute ischemic changes.   Nuclear stress test 06/16/2015: 1. Suspected mild prior infarction involving the mid and basilar aspects of the inferior wall of the left ventricle. 2. No definitive scintigraphic evidence of superimposed pharmacologically induced ischemia. 3. Mild hypokinesia involving the inferior wall of the left ventricle at the location of suspected prior myocardial infarction. Otherwise, normal wall motion. Ejection fraction - 51%.  Echocardiogram 03/11/2015: - Left ventricle: The cavity size was normal. There was moderate concentric hypertrophy. Systolic function was mildly reduced. The estimated ejection fraction was in the range of 45% to 50%. Echo-contrast was used for LV function. Hypokinesis of the inferolateral myocardium. Hypokinesis of the inferior myocardium. Left ventricular diastolic function parameters were normal. - Mitral valve: Consider papillary muscle dysfunction. Normal-sized, calcified annulus. Mobility of the posterior leaflet was mildly restricted. There was moderate to severe regurgitation directed posteriorly. - Left atrium: The atrium was mildly dilated. - Pulmonary arteries: Systolic pressure was mildly to moderately increased. PA peak pressure: 47 mm Hg (S).  Impressions:  - The right ventricular systolic pressure was increased consistent with moderate pulmonary hypertension.   Nuclear stress test 11/27/2013: 1. No scintigraphic evidence of prior infarction or pharmacologically induced ischemia.  2. Mild hypokinesia involving the basilar aspect of the inferior wall and  septum, possibly the sequela of prior median sternotomy and CABG. Otherwise, normal wall motion  3. Left ventricular ejection fraction 49%  4. Low-risk stress test findings*.  Cath 03/08/2013 (Dr. Terrence Dupont): LM: Normal LAD: 100% prox  occluded Cx: 100% prox occluded RCA: small PDA 80-90% stenosis, PLV 50-60% stenosis LIMA-LAD: Patent. Diffuse disease in dLAD after LIMA-LAD touchdown SVG-OM: Patent. Diffuse distal disease   Assessment/Recommendations:  62 year old American male with the artery disease status post CABG (LIMA-LAD, SVG-OM) and RCA not amenable to PCI, status post ICD VF cardiac arrest in 2015, HFpEF, hypertension, ESRD on HD, chronic hepatitis C and cirrhosis. Patient was admitted to short stay with plans for repair of aneurysmal RUE AV fistula.  Patient reported chest pain which prompted cardiology consult.  Chest pain/perioperative risk stratification: No EKG changes s/o ischemia. Serial cardiac biomarkers show mild elevation without fall and rise, is nonspecific and not due to acute myocardial infarction. Blood pressure better controlled. He is euvoumic on exam.   I have reviewed his previous stress tests and cath. I do not think repeat stress test will change his perioperative risk, which remains high. Perioperative PCI will not reduce cardiac risk. Moreover, he does not have any significant targets for revascularization based on previous caths. Optimal medical therapy with aspirin, statin, and adequate blood pressure control is recommended  Continue aspirin, rosuvastatin, amlodipine. If surgery rescheduled to at least a week or so later, I would then add metoprolol 25 mg PO bid. However, surgery is scheduled within next few days, I do not recommend adding a beta blocker, as it could risk peripoerative hypotension.  Above recommendations are consistent with 2014 ACC/AHA peroperative cardiac risk guidelines.   Cardiology signing off. Recommend patient to contact our office after discharge to re establish cardiac care.    LOS: 1 day    Avonelle Viveros J Nycere Presley 01/02/2018, 8:28 AM  Silvie Obremski Esther Hardy, MD [facility] Pager: [phone] Office: [phone] If no answer Cell  [phone]

## 2018-01-03 ENCOUNTER — Encounter (HOSPITAL_COMMUNITY)
Admission: RE | Disposition: A | Discharge status: Home / Self Care | Payer: Self-pay | Source: Home / Self Care | Attending: Internal Medicine

## 2018-01-03 ENCOUNTER — Encounter (HOSPITAL_COMMUNITY): Payer: Self-pay | Admitting: *Deleted

## 2018-01-03 ENCOUNTER — Inpatient Hospital Stay (HOSPITAL_COMMUNITY): Payer: Medicare Other | Admitting: Anesthesiology

## 2018-01-03 HISTORY — PX: REVISON OF ARTERIOVENOUS FISTULA: SHX6074

## 2018-01-03 LAB — GLUCOSE, CAPILLARY
Glucose-Capillary: 67 mg/dL — ABNORMAL LOW (ref 70–99)
Glucose-Capillary: 112 mg/dL — ABNORMAL HIGH (ref 70–99)
Glucose-Capillary: 53 mg/dL — ABNORMAL LOW (ref 70–99)
Glucose-Capillary: 64 mg/dL — ABNORMAL LOW (ref 70–99)
Glucose-Capillary: 67 mg/dL — ABNORMAL LOW (ref 70–99)
Glucose-Capillary: 107 mg/dL — ABNORMAL HIGH (ref 70–99)
Glucose-Capillary: 79 mg/dL (ref 70–99)
Glucose-Capillary: 130 mg/dL — ABNORMAL HIGH (ref 70–99)
Glucose-Capillary: 82 mg/dL (ref 70–99)

## 2018-01-03 LAB — CBC
HCT: 32 % — ABNORMAL LOW (ref 39.0–52.0)
Hemoglobin: 10.2 g/dL — ABNORMAL LOW (ref 13.0–17.0)
MCH: 29.4 pg (ref 26.0–34.0)
MCHC: 31.9 g/dL (ref 30.0–36.0)
MCV: 92.2 fL (ref 80.0–100.0)
NRBC: 0 % (ref 0.0–0.2)
Platelets: 137 10*3/uL — ABNORMAL LOW (ref 150–400)
RBC: 3.47 MIL/uL — ABNORMAL LOW (ref 4.22–5.81)
RDW: 14 % (ref 11.5–15.5)
WBC: 5.2 10*3/uL (ref 4.0–10.5)

## 2018-01-03 LAB — BASIC METABOLIC PANEL
Anion gap: 15 (ref 5–15)
BUN: 62 mg/dL — ABNORMAL HIGH (ref 8–23)
CO2: 26 mmol/L (ref 22–32)
Calcium: 8.6 mg/dL — ABNORMAL LOW (ref 8.9–10.3)
Chloride: 88 mmol/L — ABNORMAL LOW (ref 98–111)
Creatinine, Ser: 9.68 mg/dL — ABNORMAL HIGH (ref 0.61–1.24)
GFR calc Af Amer: 6 mL/min — ABNORMAL LOW (ref >60)
GFR, EST NON AFRICAN AMERICAN: 5 mL/min — AB (ref >60)
Glucose, Bld: 70 mg/dL (ref 70–99)
Potassium: 5.1 mmol/L (ref 3.5–5.1)
Sodium: 129 mmol/L — ABNORMAL LOW (ref 135–145)

## 2018-01-03 LAB — PROTIME-INR
INR: 1.33
Prothrombin Time: 16.3 seconds — ABNORMAL HIGH (ref 11.4–15.2)

## 2018-01-03 SURGERY — REVISON OF ARTERIOVENOUS FISTULA
Anesthesia: Monitor Anesthesia Care | Site: Arm Upper | Laterality: Right

## 2018-01-03 MED ORDER — LIDOCAINE HCL (PF) 1 % IJ SOLN
INTRAMUSCULAR | Status: AC
  Filled 2018-01-03: qty 30

## 2018-01-03 MED ORDER — FENTANYL CITRATE (PF) 100 MCG/2ML IJ SOLN: 25.0000 ug | INTRAMUSCULAR | Status: DC | PRN

## 2018-01-03 MED ORDER — ONDANSETRON HCL 4 MG/2ML IJ SOLN
INTRAMUSCULAR | Status: DC | PRN
  Administered 2018-01-03: 4 mg via INTRAVENOUS

## 2018-01-03 MED ORDER — HEPARIN SODIUM (PORCINE) 1000 UNIT/ML IJ SOLN
INTRAMUSCULAR | Status: DC | PRN
  Administered 2018-01-03: 3000 [IU] via INTRAVENOUS

## 2018-01-03 MED ORDER — PHENYLEPHRINE HCL 10 MG/ML IJ SOLN
INTRAMUSCULAR | Status: DC | PRN
  Administered 2018-01-03 (×2): 80 ug via INTRAVENOUS

## 2018-01-03 MED ORDER — PROPOFOL 10 MG/ML IV BOLUS
INTRAVENOUS | Status: AC
  Filled 2018-01-03: qty 40

## 2018-01-03 MED ORDER — SODIUM CHLORIDE 0.9 % IV SOLN
INTRAVENOUS | Status: DC | PRN
  Administered 2018-01-03: 25 ug/min via INTRAVENOUS

## 2018-01-03 MED ORDER — 0.9 % SODIUM CHLORIDE (POUR BTL) OPTIME
TOPICAL | Status: DC | PRN
  Administered 2018-01-03: 1000 mL

## 2018-01-03 MED ORDER — LIDOCAINE HCL 1 % IJ SOLN
INTRAMUSCULAR | Status: DC | PRN
  Administered 2018-01-03: 12 mL

## 2018-01-03 MED ORDER — HEPARIN SODIUM (PORCINE) 1000 UNIT/ML IJ SOLN
INTRAMUSCULAR | Status: AC
  Filled 2018-01-03: qty 1

## 2018-01-03 MED ORDER — DEXTROSE 50 % IV SOLN
INTRAVENOUS | Status: AC
  Administered 2018-01-03: 12.5 g via INTRAVENOUS
  Filled 2018-01-03: qty 50

## 2018-01-03 MED ORDER — PROPOFOL 500 MG/50ML IV EMUL
INTRAVENOUS | Status: DC | PRN
  Administered 2018-01-03: 75 ug/kg/min via INTRAVENOUS

## 2018-01-03 MED ORDER — SODIUM CHLORIDE 0.9 % IV SOLN
INTRAVENOUS | Status: AC
  Filled 2018-01-03: qty 1.2

## 2018-01-03 MED ORDER — FENTANYL CITRATE (PF) 250 MCG/5ML IJ SOLN
INTRAMUSCULAR | Status: AC
  Filled 2018-01-03: qty 5

## 2018-01-03 MED ORDER — LIDOCAINE HCL (CARDIAC) PF 100 MG/5ML IV SOSY
PREFILLED_SYRINGE | INTRAVENOUS | Status: DC | PRN
  Administered 2018-01-03: 50 mg via INTRAVENOUS

## 2018-01-03 MED ORDER — OXYCODONE HCL 5 MG PO TABS
5.0000 mg | ORAL_TABLET | Freq: Four times a day (QID) | ORAL | Status: DC | PRN
  Administered 2018-01-03 – 2018-01-04 (×3): 5 mg via ORAL
  Filled 2018-01-03 (×3): qty 1

## 2018-01-03 MED ORDER — AMLODIPINE BESYLATE 10 MG PO TABS
10.0000 mg | ORAL_TABLET | Freq: Every day | ORAL | Status: DC
  Administered 2018-01-03 – 2018-01-04 (×2): 10 mg via ORAL
  Filled 2018-01-03 (×3): qty 1

## 2018-01-03 MED ORDER — DEXTROSE 50 % IV SOLN
12.5000 g | Freq: Once | INTRAVENOUS | Status: AC
  Administered 2018-01-03: 12.5 g via INTRAVENOUS

## 2018-01-03 MED ORDER — CEFAZOLIN SODIUM-DEXTROSE 2-3 GM-%(50ML) IV SOLR
INTRAVENOUS | Status: DC | PRN
  Administered 2018-01-03: 2 g via INTRAVENOUS

## 2018-01-03 MED ORDER — FENTANYL CITRATE (PF) 100 MCG/2ML IJ SOLN
INTRAMUSCULAR | Status: DC | PRN
  Administered 2018-01-03: 50 ug via INTRAVENOUS

## 2018-01-03 MED ORDER — SODIUM CHLORIDE 0.9 % IV SOLN
INTRAVENOUS | Status: DC | PRN
  Administered 2018-01-03: 500 mL

## 2018-01-03 SURGICAL SUPPLY — 51 items
ARMBAND PINK RESTRICT EXTREMIT (MISCELLANEOUS) ×4 IMPLANT
BAG DECANTER FOR FLEXI CONT (MISCELLANEOUS) ×4 IMPLANT
BIOPATCH RED 1 DISK 7.0 (GAUZE/BANDAGES/DRESSINGS) ×3 IMPLANT
BIOPATCH RED 1IN DISK 7.0MM (GAUZE/BANDAGES/DRESSINGS) ×1
CANISTER SUCT 3000ML PPV (MISCELLANEOUS) ×4 IMPLANT
CATH PALINDROME RT-P 15FX19CM (CATHETERS) IMPLANT
CATH PALINDROME RT-P 15FX23CM (CATHETERS) IMPLANT
CATH PALINDROME RT-P 15FX28CM (CATHETERS) IMPLANT
CATH PALINDROME RT-P 15FX55CM (CATHETERS) IMPLANT
CLIP VESOCCLUDE MED 6/CT (CLIP) ×4 IMPLANT
CLIP VESOCCLUDE SM WIDE 6/CT (CLIP) ×4 IMPLANT
COVER PROBE W GEL 5X96 (DRAPES) ×4 IMPLANT
COVER SURGICAL LIGHT HANDLE (MISCELLANEOUS) ×4 IMPLANT
COVER WAND RF STERILE (DRAPES) ×4 IMPLANT
CUFF TOURNIQUET SINGLE 18IN (TOURNIQUET CUFF) ×4 IMPLANT
DERMABOND ADVANCED (GAUZE/BANDAGES/DRESSINGS) ×2
DERMABOND ADVANCED .7 DNX12 (GAUZE/BANDAGES/DRESSINGS) ×2 IMPLANT
DRAPE C-ARM 42X72 X-RAY (DRAPES) ×4 IMPLANT
DRAPE CHEST BREAST 15X10 FENES (DRAPES) ×4 IMPLANT
ELECT REM PT RETURN 9FT ADLT (ELECTROSURGICAL) ×4
ELECTRODE REM PT RTRN 9FT ADLT (ELECTROSURGICAL) ×2 IMPLANT
GAUZE 4X4 16PLY RFD (DISPOSABLE) ×4 IMPLANT
GLOVE BIO SURGEON STRL SZ7.5 (GLOVE) ×4 IMPLANT
GOWN STRL REUS W/ TWL LRG LVL3 (GOWN DISPOSABLE) ×4 IMPLANT
GOWN STRL REUS W/ TWL XL LVL3 (GOWN DISPOSABLE) ×2 IMPLANT
GOWN STRL REUS W/TWL LRG LVL3 (GOWN DISPOSABLE) ×4
GOWN STRL REUS W/TWL XL LVL3 (GOWN DISPOSABLE) ×2
KIT BASIN OR (CUSTOM PROCEDURE TRAY) ×4 IMPLANT
KIT TURNOVER KIT B (KITS) ×4 IMPLANT
NEEDLE 18GX1X1/2 (RX/OR ONLY) (NEEDLE) ×4 IMPLANT
NEEDLE HYPO 25GX1X1/2 BEV (NEEDLE) ×4 IMPLANT
NS IRRIG 1000ML POUR BTL (IV SOLUTION) ×4 IMPLANT
PACK CV ACCESS (CUSTOM PROCEDURE TRAY) ×4 IMPLANT
PACK SURGICAL SETUP 50X90 (CUSTOM PROCEDURE TRAY) ×4 IMPLANT
PAD ARMBOARD 7.5X6 YLW CONV (MISCELLANEOUS) ×8 IMPLANT
SOAP 2 % CHG 4 OZ (WOUND CARE) ×4 IMPLANT
SUT ETHILON 3 0 PS 1 (SUTURE) ×4 IMPLANT
SUT MNCRL AB 4-0 PS2 18 (SUTURE) ×4 IMPLANT
SUT PROLENE 4 0 SH DA (SUTURE) ×4 IMPLANT
SUT PROLENE 6 0 BV (SUTURE) ×4 IMPLANT
SUT VIC AB 3-0 SH 27 (SUTURE) ×2
SUT VIC AB 3-0 SH 27X BRD (SUTURE) ×2 IMPLANT
SYR 10ML LL (SYRINGE) ×4 IMPLANT
SYR 20CC LL (SYRINGE) ×4 IMPLANT
SYR 5ML LL (SYRINGE) ×4 IMPLANT
SYR CONTROL 10ML LL (SYRINGE) ×4 IMPLANT
TOWEL GREEN STERILE (TOWEL DISPOSABLE) ×4 IMPLANT
TOWEL GREEN STERILE FF (TOWEL DISPOSABLE) ×4 IMPLANT
TOWEL OR 17X26 4PK STRL BLUE (TOWEL DISPOSABLE) ×4 IMPLANT
UNDERPAD 30X30 (UNDERPADS AND DIAPERS) ×4 IMPLANT
WATER STERILE IRR 1000ML POUR (IV SOLUTION) ×4 IMPLANT

## 2018-01-03 NOTE — Progress Notes (Signed)
  Progress Note    01/03/2018 11:06 AM Day of Surgery  Subjective:  No overnight issues  Vitals:   01/02/18 1932 01/03/18 0631  BP: (!) 167/78 (!) 154/86  Pulse: 84 78  Resp: 20 18  Temp: 98 F (36.7 C) 97.6 F (36.4 C)  SpO2: 100% 100%    Physical Exam: aaox3  Right arm fistula with 2 areas of ulceration  CBC    Component Value Date/Time   WBC 5.2 01/03/2018 0227   RBC 3.47 (L) 01/03/2018 0227   HGB 10.2 (L) 01/03/2018 0227   HGB 9.9 (L) 07/24/2014 0855   HCT 32.0 (L) 01/03/2018 0227   HCT 28.7 (L) 07/24/2014 0855   PLT 137 (L) 01/03/2018 0227   PLT 206 07/24/2014 0855   MCV 92.2 01/03/2018 0227   MCV 90 07/24/2014 0855   MCH 29.4 01/03/2018 0227   MCHC 31.9 01/03/2018 0227   RDW 14.0 01/03/2018 0227   RDW 12.4 07/24/2014 0855   LYMPHSABS 2.9 11/12/2015 1317   LYMPHSABS 1.5 07/24/2014 0855   MONOABS 1.0 11/12/2015 1317   EOSABS 0.1 11/12/2015 1317   EOSABS 0.3 07/24/2014 0855   BASOSABS 0.0 11/12/2015 1317   BASOSABS 0.0 07/24/2014 0855    BMET    Component Value Date/Time   NA 129 (L) 01/03/2018 0227   K 5.1 01/03/2018 0227   CL 88 (L) 01/03/2018 0227   CO2 26 01/03/2018 0227   GLUCOSE 70 01/03/2018 0227   BUN 62 (H) 01/03/2018 0227   CREATININE 9.68 (H) 01/03/2018 0227   CALCIUM 8.6 (L) 01/03/2018 0227   GFRNONAA 5 (L) 01/03/2018 0227   GFRAA 6 (L) 01/03/2018 0227    INR    Component Value Date/Time   INR 1.33 01/03/2018 0227    No intake or output data in the 24 hours ending 01/03/18 1106   Assessment:  61 y.o. male is here for revision of avf, chest pain improved  Plan: OR today for right arm avf revision   Chasitie Passey C. Donzetta Matters, MD [facility] [place] Office: [phone] Pager: [phone]  01/03/2018 11:06 AM

## 2018-01-03 NOTE — Anesthesia Preprocedure Evaluation (Signed)
Anesthesia Evaluation  Patient identified by MRN, date of birth, ID band Patient awake    Reviewed: Allergy & Precautions, H&P , NPO status , Patient's Chart, lab work & pertinent test results  Airway Mallampati: II  TM Distance: >3 FB Neck ROM: Full    Dental  (+) Poor Dentition, Dental Advisory Given   Pulmonary neg pulmonary ROS,    breath sounds clear to auscultation       Cardiovascular hypertension, + Past MI and +CHF  + Cardiac Defibrillator + Valvular Problems/Murmurs MR  Rhythm:Regular Rate:Normal + Systolic murmurs    Neuro/Psych negative neurological ROS  negative psych ROS   GI/Hepatic negative GI ROS, Neg liver ROS,   Endo/Other  diabetes  Renal/GU ESRF and DialysisRenal disease  negative genitourinary   Musculoskeletal   Abdominal   Peds  Hematology negative hematology ROS (+)   Anesthesia Other Findings   Reproductive/Obstetrics negative OB ROS                             Anesthesia Physical Anesthesia Plan  ASA: IV  Anesthesia Plan: MAC   Post-op Pain Management:    Induction:   PONV Risk Score and Plan: 1 and Treatment may vary due to age or medical condition and Propofol infusion  Airway Management Planned: Nasal Cannula  Additional Equipment: None  Intra-op Plan:   Post-operative Plan:   Informed Consent: I have reviewed the patients History and Physical, chart, labs and discussed the procedure including the risks, benefits and alternatives for the proposed anesthesia with the patient or authorized representative who has indicated his/her understanding and acceptance.   Dental advisory given  Plan Discussed with: CRNA and Surgeon  Anesthesia Plan Comments:         Anesthesia Quick Evaluation

## 2018-01-03 NOTE — Progress Notes (Signed)
[facility] Progress Note  Subjective: no chest pain this am  Vitals:   01/02/18 1226 01/02/18 1932 01/03/18 0631 01/03/18 1025  BP: (!) 157/103 (!) 167/78 (!) 154/86   Pulse: 75 84 78   Resp: 14 20 18    Temp: 98.1 F (36.7 C) 98 F (36.7 C) 97.6 F (36.4 C)   TempSrc: Oral Oral Oral   SpO2: 100% 100% 100%   Weight:    73.9 kg  Height:    5\' 6"  (1.676 m)    Inpatient medications: . [MAR Hold] amLODipine  10 mg Oral Daily  . [MAR Hold] aspirin EC  81 mg Oral Daily  . [MAR Hold] calcium acetate  2,001 mg Oral TID WC  . [MAR Hold] Chlorhexidine Gluconate Cloth  6 each Topical Q0600  . [MAR Hold] cinacalcet  30 mg Oral Q supper  . [MAR Hold] heparin  5,000 Units Subcutaneous BID  . [MAR Hold] multivitamin  1 tablet Oral QHS  . [MAR Hold] pantoprazole  40 mg Oral Daily  . [MAR Hold] rosuvastatin  20 mg Oral q1800   . sodium chloride 10 mL/hr at 01/03/18 1032  . [MAR Hold]  ceFAZolin (ANCEF) IV     [MAR Hold] acetaminophen, [MAR Hold] hydrALAZINE, [MAR Hold] ondansetron **OR** [MAR Hold] ondansetron (ZOFRAN) IV  Iron/TIBC/Ferritin/ %Sat    Component Value Date/Time   IRON 41 (L) 03/11/2015 1534   TIBC 188 (L) 03/11/2015 1534   FERRITIN 122 03/11/2015 1534   IRONPCTSAT 22 03/11/2015 1534    Exam:  alert, no distress, calm  no jvd  chest cta bilat   Cor reg no mrg  Abd soft ntnd no ascites  Ext no edema   NF, Ox 3  RUE AVF+thrill and 2 scabs present  D[facility]  3.5h   400/1.5  72.5kg   2/2.5 bath  AVF RUA   Hep none - Mircera 100 q 2 weeks (last 12/9) - Hectoral 83mcg IV q HD - Venofer 50mg  IV weekly  Assessment/Plan: 1. Chest pain (unstable angina): trop's flat and EKG's neg per cardiology, no plan for intervention. CP resolved 2. R AVF ulceration: Revision on for today per VVS.  3. ESRD: usual HD TTS. Stable vol/ lytes. Plan HD after surgery today.  4. Hypertension/volume: BP variable, no edema on exam. Close to dry wt 5. Anemia:  Hgb 12.2, just dosed with ESA on 98/9. 6. Metabolic bone disease: Ca ok, Phos pending. Continue home meds. 7. CAD (Hx CABG, HFrEF) 8. 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

## 2018-01-03 NOTE — Progress Notes (Signed)
Spoke with Dr. Roanna Banning who wants [place] rep notified of the procedure.   [employer] called and waiting on a return call from the rep

## 2018-01-03 NOTE — Op Note (Signed)
    Patient name: Eric [name] MRN: [id number] DOB: 07-10-1955 Sex: male  01/03/2018 Pre-operative Diagnosis: End-stage renal disease, AV fistula ulceration right upper extremity Post-operative diagnosis:  Same Surgeon:  Eric [name]. Donzetta Matters, MD Assistant: Leontine Locket, PA Procedure Performed: Revision of right arm AV fistula with plication of 2 areas of pseudoaneurysmal ulceration  Indications: 62 year old male with history end-stage renal disease.  He has ulceration over 2 areas of his AV fistula that is indicated for revision.  He was admitted after having chest pain on initial presentation.  Chest pain has resolved and he is indicated for operation.  Findings: 2 areas of pseudoaneurysmal ulceration were repaired.  At completion there was a palpable thrill in the fistula.   Procedure:  The patient was identified in the holding area and taken to the operating room was placed to bilateral table MAC anesthesia was induced.  Sterilely prepped and draped in the right upper extremity in the usual fashion.  A timeout was called.  2 g Ancef were administered and 3000 units of heparin were given.  We first anesthetized the areas of ulceration with 1% lidocaine with epinephrine.  Esmarch was then used to exsanguinate the right upper extremity and tourniquet was inflated to 200 mmHg.  Elliptical incisions were made around both areas of ulceration.  We dissected down onto the fistula itself elevated the skin around it and remove the ulcerative areas in both places.  4-0 Prolene suture was used to close the areas of the fistulous with running mattress configurations.  Prior to completing the second suture line we did irrigate with heparinized saline to fill the fistula.  Upon completion we then let the tourniquet down.  We then obtained hemostasis irrigated the wounds closed them in layers with interrupted 3-0 Vicryl followed by running 4-0 Monocryl in both areas.  Dermabond is placed to level skin.  He tolerated  procedure without immediate complication.  All counts were correct at completion.  EBL 20 cc   [name] C. Donzetta Matters, MD [facility] Office: [phone] Pager: [phone]

## 2018-01-03 NOTE — Progress Notes (Signed)
Hypoglycemic Event  CBG: 53  Treatment: 4 oz juice/soda  Symptoms: None  Follow-up CBG: Time:1308 CBG Result:67  Patient ate lunch  Follow-up CBG: Time:1340             CBG Result: 79  Possible Reasons for Event: Inadequate meal intake      Ciarra Braddy, Jacinto Halim

## 2018-01-03 NOTE — Progress Notes (Signed)
PROGRESS NOTE  Eric [name] XIP:[id number] DOB: July 06, 1955 DOA: 01/01/2018 PCP: [name], NP  HPI/Recap of past 24 hours: Eric [name]  is a 62 y.o. male, with past medical history significant for congestive heart failure, systolic, status post defibrillator placement, history of end-stage renal disease on hemodialysis Tuesday Thursday Saturday who was admitted to the hospital for revision of right upper extremity fistula.  Patient started having chest pressure while having IV fluids administered.  No associated nausea vomiting but there was mild shortness of breath. His fistula revision was canceled and cardiology was consulted.  EKG reviewed with no acute specific ST changes.   01/02/2018: Patient seen and examined with his daughter and wife at his bedside.  Reports persistent intermittent chest pressure which is located centrally and associated with dyspnea at rest.  01/03/2018: Patient seen and examined at his bedside.  Wife and daughter present.  Continues to have intermittent chest pain.  Twelve-lead EKG and troponin unremarkable.  Scheduled for right aVF revision and hemodialysis today after vascular surgery.  Assessment/Plan: Active Problems:   ACS (acute coronary syndrome) (HCC)  Chest pain rule out ACS ACS has not been ruled in Intermittent with unremarkable twelve-lead EKG and troponins Cardiology was consulted and signed off on 01/02/2018  Right upper extremity fistula malfunction Vascular surgery following Plan for right AVF revision today by vascular surgery  Chronic systolic CHF Last 2D echo done on 03/11/2015 revealed LVEF 45 to 50% with moderately increased pulmonary arteries pressure with hypokinesis of the inferior lateral myocardium Continue cardiac medications as recommended by cardiology  Mild pulmonary edema Independently reviewed chest x-ray done on 01/01/2018 which revealed mild cardiomegaly with increase of pulmonary vascularity suggestive of  mild pulmonary edema Patient will be hemodialyzed today  Hyponatremia Sodium 129 Repeat tomorrow after dialysis  Uncontrolled hypertension Norvasc increased to 10 Monitor vital signs after dialysis Blood pressure may drop after dialysis  End-stage renal disease on dialysis Tuesday Thursday Saturday HD planned today after vascular surgery  Coronary artery disease  Continue aspirin and Crestor  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     DVT Prophylaxis  subcu Lovenox daily   Family Communication:  Daughter and wife at bedside all questions answered to their satisfaction.  Code Status full  Disposition Plan: Home possibly tomorrow with vascular surgery and nephrology sign off.    Objective: Vitals:   01/03/18 1315 01/03/18 1330 01/03/18 1345 01/03/18 1420  BP: (!) 153/86 (!) 150/86 (!) 154/86 (!) 158/81  Pulse: 81 83 84 82  Resp: 19 18 13 14   Temp:   97.7 F (36.5 C) 97.7 F (36.5 C)  TempSrc:    Oral  SpO2: 99% 98%  98%  Weight:      Height:        Intake/Output Summary (Last 24 hours) at 01/03/2018 1619 Last data filed at 01/03/2018 1347 Gross per 24 hour  Intake 640 ml  Output -  Net 640 ml   Filed Weights   01/01/18 0918 01/03/18 1025  Weight: 73.9 kg 73.9 kg    Exam:  . General: 62 y.o. year-old male well-developed well-nourished no acute distress.  Alert and interactive. . Cardiovascular: Regular rate and rhythm with no rubs or gallops.  No JVD noted. Marland Kitchen Respiratory: Clear to auscultation with no wheezes or rales.  Good inspiratory effort. . Abdomen: Soft nontender nondistended with normal bowel sounds x4 quadrants. . Musculoskeletal: Trace lower extremity edema. 2/4 pulses in  all 4 extremities.  Right upper extremity aVF. Marland Kitchen Psychiatry: Mood is appropriate for condition and setting   Data Reviewed: CBC: Recent Labs  Lab 01/01/18 0941 01/03/18 0227  WBC  --  5.2  HGB 12.2* 10.2*    HCT 36.0* 32.0*  MCV  --  92.2  PLT  --  740*   Basic Metabolic Panel: Recent Labs  Lab 01/01/18 0941 01/01/18 1117 01/03/18 0227  NA 127* 130* 129*  K 4.5 4.1 5.1  CL  --  90* 88*  CO2  --  25 26  GLUCOSE 75 84 70  BUN  --  38* 62*  CREATININE  --  6.94* 9.68*  CALCIUM  --  8.7* 8.6*   GFR: Estimated Creatinine Clearance: 7 mL/min (A) (by C-G formula based on SCr of 9.68 mg/dL (H)). Liver Function Tests: Recent Labs  Lab 01/01/18 1117  AST 19  ALT 22  ALKPHOS 57  BILITOT 0.7  PROT 8.8*  ALBUMIN 3.4*   No results for input(s): LIPASE, AMYLASE in the last 168 hours. No results for input(s): AMMONIA in the last 168 hours. Coagulation Profile: Recent Labs  Lab 01/03/18 0227  INR 1.33   Cardiac Enzymes: Recent Labs  Lab 01/01/18 1117 01/01/18 1652 01/01/18 2103 01/02/18 0338  TROPONINI 0.04* 0.03* 0.04* 0.03*   BNP (last 3 results) No results for input(s): PROBNP in the last 8760 hours. HbA1C: Recent Labs    01/01/18 1007  HGBA1C 4.7*   CBG: Recent Labs  Lab 01/03/18 1247 01/03/18 1308 01/03/18 1321 01/03/18 1342 01/03/18 1417  GLUCAP 53* 67* 64* 79 107*   Lipid Profile: Recent Labs    01/01/18 1652  CHOL 148  HDL 32*  LDLCALC 93  TRIG 113  CHOLHDL 4.6   Thyroid Function Tests: No results for input(s): TSH, T4TOTAL, FREET4, T3FREE, THYROIDAB in the last 72 hours. Anemia Panel: No results for input(s): VITAMINB12, FOLATE, FERRITIN, TIBC, IRON, RETICCTPCT in the last 72 hours. Urine analysis:    Component Value Date/Time   COLORURINE YELLOW 03/12/2015 0020   APPEARANCEUR CLEAR 03/12/2015 0020   LABSPEC 1.015 03/12/2015 0020   PHURINE 5.5 03/12/2015 0020   GLUCOSEU NEGATIVE 03/12/2015 0020   HGBUR SMALL (A) 03/12/2015 0020   BILIRUBINUR NEGATIVE 03/12/2015 0020   KETONESUR NEGATIVE 03/12/2015 0020   PROTEINUR >300 (A) 03/12/2015 0020   UROBILINOGEN 1.0 08/03/2014 1855   NITRITE NEGATIVE 03/12/2015 0020   LEUKOCYTESUR NEGATIVE  03/12/2015 0020   Sepsis Labs: @LABRCNTIP (procalcitonin:4,lacticidven:4)  ) Recent Results (from the past 240 hour(s))  Surgical PCR screen     Status: None   Collection Time: 01/01/18  5:49 PM  Result Value Ref Range Status   MRSA, PCR NEGATIVE NEGATIVE Final   Staphylococcus aureus NEGATIVE NEGATIVE Final    Comment: (NOTE) The Xpert SA Assay (FDA approved for NASAL specimens in patients 70 years of age and older), is one component of a comprehensive surveillance program. It is not intended to diagnose infection nor to guide or monitor treatment. Performed at [facility], [place] [address] [place], [place] [address]       Studies: No results found.  Scheduled Meds: . amLODipine  10 mg Oral Daily  . aspirin EC  81 mg Oral Daily  . calcium acetate  2,001 mg Oral TID WC  . Chlorhexidine Gluconate Cloth  6 each Topical Q0600  . cinacalcet  30 mg Oral Q supper  . heparin  5,000 Units Subcutaneous BID  . multivitamin  1  tablet Oral QHS  . pantoprazole  40 mg Oral Daily  . rosuvastatin  20 mg Oral q1800    Continuous Infusions: . sodium chloride 10 mL/hr at 01/03/18 1032  .  ceFAZolin (ANCEF) IV       LOS: 2 days     Kayleen Memos, MD [facility] Pager [phone]  If 7PM-7AM, please contact night-coverage [email or web address] Password [facility] 01/03/2018, 4:19 PM

## 2018-01-03 NOTE — Transfer of Care (Signed)
Immediate Anesthesia Transfer of Care Note  Patient: Eric [name]  Procedure(s) Performed: REVISION OF ARTERIOVENOUS FISTULA (Right Arm Upper)  Patient Location: PACU  Anesthesia Type:MAC  Level of Consciousness: awake, alert , oriented, patient cooperative and responds to stimulation  Airway & Oxygen Therapy: Patient Spontanous Breathing and Patient connected to nasal cannula oxygen  Post-op Assessment: Report given to RN and Post -op Vital signs reviewed and stable  Post vital signs: Reviewed and stable  Last Vitals:  Vitals Value Taken Time  BP 151/83 01/03/2018 12:45 PM  Temp    Pulse 75 01/03/2018 12:46 PM  Resp 15 01/03/2018 12:46 PM  SpO2 100 % 01/03/2018 12:46 PM  Vitals shown include unvalidated device data.  Last Pain:  Vitals:   01/03/18 1025  TempSrc:   PainSc: 0-No pain      Patients Stated Pain Goal: 4 (38/10/17 5102)  Complications: No apparent anesthesia complications

## 2018-01-04 ENCOUNTER — Encounter (HOSPITAL_COMMUNITY): Payer: Self-pay | Admitting: Vascular Surgery

## 2018-01-04 LAB — CBC
HCT: 32.4 % — ABNORMAL LOW (ref 39.0–52.0)
Hemoglobin: 10.3 g/dL — ABNORMAL LOW (ref 13.0–17.0)
MCH: 29.6 pg (ref 26.0–34.0)
MCHC: 31.8 g/dL (ref 30.0–36.0)
MCV: 93.1 fL (ref 80.0–100.0)
NRBC: 0 % (ref 0.0–0.2)
PLATELETS: 148 10*3/uL — AB (ref 150–400)
RBC: 3.48 MIL/uL — AB (ref 4.22–5.81)
RDW: 14.3 % (ref 11.5–15.5)
WBC: 6.4 10*3/uL (ref 4.0–10.5)

## 2018-01-04 LAB — RENAL FUNCTION PANEL
Albumin: 3.2 g/dL — ABNORMAL LOW (ref 3.5–5.0)
Anion gap: 17 — ABNORMAL HIGH (ref 5–15)
BUN: 74 mg/dL — ABNORMAL HIGH (ref 8–23)
CO2: 22 mmol/L (ref 22–32)
Calcium: 8.4 mg/dL — ABNORMAL LOW (ref 8.9–10.3)
Chloride: 87 mmol/L — ABNORMAL LOW (ref 98–111)
Creatinine, Ser: 10.47 mg/dL — ABNORMAL HIGH (ref 0.61–1.24)
GFR calc non Af Amer: 5 mL/min — ABNORMAL LOW (ref >60)
GFR, EST AFRICAN AMERICAN: 5 mL/min — AB (ref >60)
Glucose, Bld: 60 mg/dL — ABNORMAL LOW (ref 70–99)
Phosphorus: 5.5 mg/dL — ABNORMAL HIGH (ref 2.5–4.6)
Potassium: 5.5 mmol/L — ABNORMAL HIGH (ref 3.5–5.1)
Sodium: 126 mmol/L — ABNORMAL LOW (ref 135–145)

## 2018-01-04 LAB — GLUCOSE, CAPILLARY
Glucose-Capillary: 87 mg/dL (ref 70–99)
Glucose-Capillary: 71 mg/dL (ref 70–99)
Glucose-Capillary: 90 mg/dL (ref 70–99)
Glucose-Capillary: 68 mg/dL — ABNORMAL LOW (ref 70–99)
Glucose-Capillary: 77 mg/dL (ref 70–99)
Glucose-Capillary: 76 mg/dL (ref 70–99)
Glucose-Capillary: 151 mg/dL — ABNORMAL HIGH (ref 70–99)

## 2018-01-04 MED ORDER — HYDROMORPHONE HCL 1 MG/ML IJ SOLN: 0.5000 mg | Freq: Four times a day (QID) | INTRAMUSCULAR | Status: DC | PRN

## 2018-01-04 MED ORDER — CHLORHEXIDINE GLUCONATE CLOTH 2 % EX PADS: 6.0000 | MEDICATED_PAD | Freq: Every day | CUTANEOUS | Status: DC

## 2018-01-04 NOTE — Progress Notes (Signed)
CBG 87 patient has no headache and resting

## 2018-01-04 NOTE — Progress Notes (Addendum)
Subjective:  No chest pain, Late hd last night after revision and only 51 min 2/2 Access function issues causing VP OOL  ,thus  HD today after lunch  Objective Vital signs in last 24 hours: Vitals:   01/04/18 0200 01/04/18 0211 01/04/18 0258 01/04/18 0328  BP: (!) 153/80 (!) 160/74 (!) 158/83 (!) 157/83  Pulse: 76 78 78 86  Resp: 15 14 14 17   Temp:   97.7 F (36.5 C) 98.5 F (36.9 C)  TempSrc:   Oral Oral  SpO2: 96% 98% 97% 95%  Weight:   75.9 kg 78.1 kg  Height:       Weight change:   Physical Exam: General: Alert NAD,Male , appropriate and calm  Heart: RRR , no m,r,g Lungs: CTA Abdomen: BS pos. , soft , nt, nd Extremities: Dialysis Access: RUA AVF  Pos . Bruit  With Surgical site dry  D[facility]  3.5h   400/1.5  72.5kg   2/2.5 bath  AVF RUA   Hep none - Mircera 100 q 2 weeks (last 12/9) - Hectoral 11mcg IV q HD - Venofer 50mg  IV weekly  Problem/Plan: 1. Chest pain (unstable angina): CP has resolved/ trop's flat and EKG's neg per cardiology, no plan for intervention. Card  Signed off 01/02/18 and  Dr Nigel Mormon = "Recommend patient to contact our office after discharge to re establish cardiac care. " 2. R AVF ulceration: Revision yest 01/03/18 Dr Donzetta Matters .  3. ESRD:usual HD TTS. Hd late last night and after revision , trouble with VP only 51 min hd.  Will re-attempt hd today, VVS has seen access / Stable vol/ k 5.5 / HD tomorrow as well to keep on schedule  4. Hypertension/volume:BP variable, no edema on exam. Close to dry wt 5. Anemia:Hgb 12.2,>10.3  just dosed with ESA on 93/7. 6. Metabolic bone disease:Ca ok, Phos 5.5 . Continue home meds. 7. CAD (Hx CABG, HFrEF) 8. 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  Kelly Splinter MD [facility] pager [phone]   01/04/2018, 1:35 PM   Labs: Basic  Metabolic Panel: Recent Labs  Lab 01/01/18 1117 01/03/18 0227 01/04/18 0221  NA 130* 129* 126*  K 4.1 5.1 5.5*  CL 90* 88* 87*  CO2 25 26 22   GLUCOSE 84 70 60*  BUN 38* 62* 74*  CREATININE 6.94* 9.68* 10.47*  CALCIUM 8.7* 8.6* 8.4*  PHOS  --   --  5.5*   Liver Function Tests: Recent Labs  Lab 01/01/18 1117 01/04/18 0221  AST 19  --   ALT 22  --   ALKPHOS 57  --   BILITOT 0.7  --   PROT 8.8*  --   ALBUMIN 3.4* 3.2*   No results for input(s): LIPASE, AMYLASE in the last 168 hours. No results for input(s): AMMONIA in the last 168 hours. CBC: Recent Labs  Lab 01/01/18 0941 01/03/18 0227 01/04/18 0221  WBC  --  5.2 6.4  HGB 12.2* 10.2* 10.3*  HCT 36.0* 32.0* 32.4*  MCV  --  92.2 93.1  PLT  --  137* 148*   Cardiac Enzymes: Recent Labs  Lab 01/01/18 1117 01/01/18 1652 01/01/18 2103 01/02/18 0338  TROPONINI 0.04* 0.03* 0.04* 0.03*   CBG: Recent Labs  Lab 01/03/18 1639 01/03/18 2054 01/04/18 0408 01/04/18 0600 01/04/18 0725  GLUCAP  130* 82 87 71 90    Studies/Results: No results found. Medications: . sodium chloride 10 mL/hr at 01/03/18 1032  .  ceFAZolin (ANCEF) IV     . amLODipine  10 mg Oral Daily  . aspirin EC  81 mg Oral Daily  . calcium acetate  2,001 mg Oral TID WC  . Chlorhexidine Gluconate Cloth  6 each Topical Q0600  . cinacalcet  30 mg Oral Q supper  . heparin  5,000 Units Subcutaneous BID  . multivitamin  1 tablet Oral QHS  . pantoprazole  40 mg Oral Daily  . rosuvastatin  20 mg Oral q1800

## 2018-01-04 NOTE — Progress Notes (Signed)
CBG 71 patient voiced no complaints. Gave 2 cups of peaches.

## 2018-01-04 NOTE — Progress Notes (Addendum)
Patient back from dialysis . Alert and orin. Skin warm and dry. C/o of inc. Soreness rt upper arm one Oxy. 5 mg p.o. given . Check lab work that was drawn at 02:21 while in dialysis Glucose was 60 . Patient voiced n c/o Headache. Gave patient graham crackers and peanut butter and will reck. CBG in 15 mins after he eats.R.N. aware

## 2018-01-04 NOTE — Progress Notes (Signed)
Eric BirkenheadT. went into do repeat CBG and patient in bathroom.

## 2018-01-04 NOTE — Care Management Important Message (Signed)
Important Message  Patient Details  Name: Eric [name] MRN: [id number] Date of Birth: January 04, 1956   Medicare Important Message Given:  Yes    Barb Merino Jisel Fleet 01/04/2018, 12:58 PM

## 2018-01-04 NOTE — Evaluation (Signed)
Physical Therapy Evaluation Patient Details Name: Eric [name] MRN: [id number] DOB: [birth year] Today's Date: 01/04/2018   History of Present Illness  Pt is a 62 y.o. M with significant PMH of congestive heart failure, status post defibrillator placement, history of ESRD on hemodialysis TTHS, who was admitted for hospital for revision of right upper extremity fistula.  Clinical Impression  Patient evaluated by Physical Therapy with no further acute PT needs identified. Prior to admission, patient independent with mobility using a cane intermittently and denies history of falls. On PT evaluation, patient presenting with mild dynamic balance deficits, ambulating 300 feet with supervision. Recommended cane for mobility. Educated patient on protective footwear and skin inspections secondary to history of peripheral neuropathy. Pt wife has been accompanying patient on walks 3 times per day during inpatient stay. All education has been completed and the patient has no further questions. No follow-up Physical Therapy or equipment needs. PT is signing off. Thank you for this referral.     Follow Up Recommendations No PT follow up    Equipment Recommendations  None recommended by PT    Recommendations for Other Services       Precautions / Restrictions Precautions Precautions: Fall Restrictions Weight Bearing Restrictions: No      Mobility  Bed Mobility Overal bed mobility: Independent                Transfers Overall transfer level: Independent Equipment used: None                Ambulation/Gait Ambulation/Gait assistance: Supervision Gait Distance (Feet): 300 Feet Assistive device: None Gait Pattern/deviations: Decreased stride length;Decreased dorsiflexion - right;Decreased dorsiflexion - left;Leaning posteriorly   Gait velocity interpretation: 1.31 - 2.62 ft/sec, indicative of limited community ambulator General Gait Details: Pt with sway back posture throughout  gait, decreased reciprocal arm swing, and decreased bilateral heel strike at initial contac.t Mild unsteadiness noted but no overt LOB  Stairs            Wheelchair Mobility    Modified Rankin (Stroke Patients Only)       Balance Overall balance assessment: Mild deficits observed, not formally tested                                           Pertinent Vitals/Pain Pain Assessment: No/denies pain    Home Living Family/patient expects to be discharged to:: Private residence Living Arrangements: Spouse/significant other Available Help at Discharge: Family Type of Home: House Home Access: Stairs to enter   Technical brewer of Steps: 2 Home Layout: One level Home Equipment: Environmental consultant - 2 wheels;Cane - single point      Prior Function Level of Independence: Independent with assistive device(s)         Comments: uses cane prn. recently moved from [place] with his wife and in [place] they were [facility] with the [facility] and enjoyed exercising     Hand Dominance        Extremity/Trunk Assessment   Upper Extremity Assessment Upper Extremity Assessment: Overall WFL for tasks assessed    Lower Extremity Assessment Lower Extremity Assessment: RLE deficits/detail;LLE deficits/detail RLE Sensation: history of peripheral neuropathy LLE Sensation: history of peripheral neuropathy    Cervical / Trunk Assessment Cervical / Trunk Assessment: Other exceptions Cervical / Trunk Exceptions: sway back posture  Communication   Communication: No difficulties  Cognition Arousal/Alertness: Awake/alert Behavior During Therapy:  WFL for tasks assessed/performed Overall Cognitive Status: Within Functional Limits for tasks assessed                                        General Comments      Exercises     Assessment/Plan    PT Assessment Patent does not need any further PT services  PT Problem List Decreased balance;Decreased  mobility;Impaired sensation       PT Treatment Interventions      PT Goals (Current goals can be found in the Care Plan section)  Acute Rehab PT Goals Patient Stated Goal: "get back to exercising." PT Goal Formulation: All assessment and education complete, DC therapy    Frequency     Barriers to discharge        Co-evaluation               AM-PAC PT "6 Clicks" Mobility  Outcome Measure Help needed turning from your back to your side while in a flat bed without using bedrails?: None Help needed moving from lying on your back to sitting on the side of a flat bed without using bedrails?: None Help needed moving to and from a bed to a chair (including a wheelchair)?: None Help needed standing up from a chair using your arms (e.g., wheelchair or bedside chair)?: None Help needed to walk in hospital room?: None Help needed climbing 3-5 steps with a railing? : A Little 6 Click Score: 23    End of Session Equipment Utilized During Treatment: Gait belt Activity Tolerance: Patient tolerated treatment well Patient left: in bed;with call bell/phone within reach;with family/visitor present Nurse Communication: Mobility status PT Visit Diagnosis: Unsteadiness on feet (R26.81);Other abnormalities of gait and mobility (R26.89)    Time: 6301-6010 PT Time Calculation (min) (ACUTE ONLY): 16 min   Charges:   PT Evaluation $PT Eval Moderate Complexity: 1 Mod          Ellamae Sia, [place], DPT Acute Rehabilitation Services Pager [phone] Office [phone]   Willy Eddy 01/04/2018, 11:22 AM

## 2018-01-04 NOTE — Progress Notes (Signed)
HD tx ended after 51 mins (2 hr 39 min early) @ 0211 d/t access function issues causing VP OOL and constant alarming UF goal not met Blood rinsed back VSS Report called to [place], RN Dr. Moshe Cipro called and made aware

## 2018-01-04 NOTE — Anesthesia Postprocedure Evaluation (Signed)
Anesthesia Post Note  Patient: Eric [name]  Procedure(s) Performed: REVISION OF ARTERIOVENOUS FISTULA (Right Arm Upper)     Patient location during evaluation: PACU Anesthesia Type: MAC Level of consciousness: awake and alert Pain management: pain level controlled Vital Signs Assessment: post-procedure vital signs reviewed and stable Respiratory status: spontaneous breathing, nonlabored ventilation, respiratory function stable and patient connected to nasal cannula oxygen Cardiovascular status: stable and blood pressure returned to baseline Postop Assessment: no apparent nausea or vomiting Anesthetic complications: no    Last Vitals:  Vitals:   01/04/18 1759 01/04/18 1936  BP: (!) 158/80 (!) 142/80  Pulse: 84 87  Resp: 15 18  Temp: 36.9 C 36.9 C  SpO2: 98% 99%    Last Pain:  Vitals:   01/04/18 1936  TempSrc: Oral  PainSc:                  Mistee Soliman

## 2018-01-04 NOTE — Progress Notes (Addendum)
  Progress Note    01/04/2018 7:17 AM 1 Day Post-Op  Subjective:  Patient says they were unable to cannulate fistula last night for hemodialysis.   Vitals:   01/04/18 0258 01/04/18 0328  BP: (!) 158/83 (!) 157/83  Pulse: 78 86  Resp: 14 17  Temp: 97.7 F (36.5 C) 98.5 F (36.9 C)  SpO2: 97% 95%   Physical Exam: Lungs:  Non labored Incisions:  R arm incisions c/d/i Extremities:  Palpable thrill above and below surgical site Abdomen:  Soft Neurologic: A&O  CBC    Component Value Date/Time   WBC 6.4 01/04/2018 0221   RBC 3.48 (L) 01/04/2018 0221   HGB 10.3 (L) 01/04/2018 0221   HGB 9.9 (L) 07/24/2014 0855   HCT 32.4 (L) 01/04/2018 0221   HCT 28.7 (L) 07/24/2014 0855   PLT 148 (L) 01/04/2018 0221   PLT 206 07/24/2014 0855   MCV 93.1 01/04/2018 0221   MCV 90 07/24/2014 0855   MCH 29.6 01/04/2018 0221   MCHC 31.8 01/04/2018 0221   RDW 14.3 01/04/2018 0221   RDW 12.4 07/24/2014 0855   LYMPHSABS 2.9 11/12/2015 1317   LYMPHSABS 1.5 07/24/2014 0855   MONOABS 1.0 11/12/2015 1317   EOSABS 0.1 11/12/2015 1317   EOSABS 0.3 07/24/2014 0855   BASOSABS 0.0 11/12/2015 1317   BASOSABS 0.0 07/24/2014 0855    BMET    Component Value Date/Time   NA 126 (L) 01/04/2018 0221   K 5.5 (H) 01/04/2018 0221   CL 87 (L) 01/04/2018 0221   CO2 22 01/04/2018 0221   GLUCOSE 60 (L) 01/04/2018 0221   BUN 74 (H) 01/04/2018 0221   CREATININE 10.47 (H) 01/04/2018 0221   CALCIUM 8.4 (L) 01/04/2018 0221   GFRNONAA 5 (L) 01/04/2018 0221   GFRAA 5 (L) 01/04/2018 0221    INR    Component Value Date/Time   INR 1.33 01/03/2018 0227     Intake/Output Summary (Last 24 hours) at 01/04/2018 0717 Last data filed at 01/04/2018 0600 Gross per 24 hour  Intake 960 ml  Output -61 ml  Net 1021 ml     Assessment/Plan:  62 y.o. male is s/p plication of R arm fistula 1 Day Post-Op   Patent fistula with palpable thrill; surgical site unremarkable I assume patient will attempt HD again today  via R arm fistula Based on physical exam there is ample length of fistula for cannulation away from surgical site   Dagoberto Ligas, PA-C [facility] [phone] 01/04/2018 7:17 AM   I have independently interviewed and examined patient and agree with PA assessment and plan above.  Fistula appears to have suitable length for cannulation.  If the skin continues to be an issue he would need a catheter to allow the arm to rest while it heals hopefully this can be avoided.  Lasandra Batley C. Donzetta Matters, MD [facility] [place] Office: [phone] Pager: [phone]

## 2018-01-04 NOTE — Progress Notes (Signed)
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 Had to try and turn up BFR to get AP in the negative as it was running in the positive and constantly alarming.

## 2018-01-04 NOTE — Progress Notes (Signed)
PROGRESS NOTE  Eric [name] QMV:[id number] DOB: [birth year] DOA: 01/01/2018 PCP: [name], NP  HPI/Recap of past 24 hours: Eric [name]  is a 63 y.o. male, with past medical history significant for congestive heart failure, systolic, status post defibrillator placement, history of end-stage renal disease on hemodialysis Tuesday Thursday Saturday who was admitted to the hospital for revision of right upper extremity fistula.  Patient started having chest pressure while having IV fluids administered.  No associated nausea vomiting but there was mild shortness of breath. His fistula revision was canceled and cardiology was consulted.  EKG reviewed with no acute specific ST changes.   01/02/2018: Patient seen and examined with his daughter and wife at his bedside.  Reports persistent intermittent chest pressure which is located centrally and associated with dyspnea at rest.  01/03/2018: Patient seen and examined at his bedside.  Wife and daughter present.  Continues to have intermittent chest pain.  Twelve-lead EKG and troponin unremarkable.  Scheduled for right aVF revision and hemodialysis today after vascular surgery.  01/04/2018: Patient seen and examined at bedside.  Continues to have intermittent chest pain.  Vascular surgery following for dialysis access malfunction.  Assessment/Plan: Active Problems:   ACS (acute coronary syndrome) (HCC)  Chest pain rule out ACS ACS has not been ruled in Intermittent with unremarkable twelve-lead EKG and troponins Cardiology was consulted and signed off on 01/02/2018 Tylenol for mild pain oxycodone for moderate pain and Dilaudid for severe pain  Right upper extremity fistula malfunction Vascular surgery following Plan for right AVF revision by vascular surgery  Chronic systolic CHF Last 2D echo done on 03/11/2015 revealed LVEF 45 to 50% with moderately increased pulmonary arteries pressure with hypokinesis of the inferior lateral  myocardium Continue cardiac medications as recommended by cardiology  Mild pulmonary edema Independently reviewed chest x-ray done on 01/01/2018 which revealed mild cardiomegaly with increase of pulmonary vascularity suggestive of mild pulmonary edema HD planned today Patient will be hemodialyzed again tomorrow 01/05/2018  Hyponatremia Sodium 126 from 129 Will be hemodialyzed today and tomorrow  Uncontrolled hypertension Norvasc increased to 10 Monitor vital signs after dialysis Blood pressure may drop after dialysis  End-stage renal disease on dialysis Tuesday Thursday Saturday HD planned today after vascular surgery and tomorrow to keep Schedule  Coronary artery disease  Continue aspirin and Crestor  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     DVT Prophylaxis  subcu Lovenox daily   Family Communication:  Daughter and wife at bedside all questions answered to their satisfaction.  Code Status full  Disposition Plan: Home possibly tomorrow when vascular surgery and nephrology sign off.    Objective: Vitals:   01/04/18 1430 01/04/18 1500 01/04/18 1530 01/04/18 1600  BP: (!) 146/86 (P) 140/77 (P) 138/79 (P) 135/74  Pulse: 79 (P) 78 (P) 78 (P) 80  Resp:      Temp:      TempSrc:      SpO2:      Weight:      Height:        Intake/Output Summary (Last 24 hours) at 01/04/2018 1756 Last data filed at 01/04/2018 2841 Gross per 24 hour  Intake 320 ml  Output 139 ml  Net 181 ml   Filed Weights   01/04/18 0258 01/04/18 0328 01/04/18 1330  Weight: 75.9 kg 78.1 kg 79.2 kg    Exam:  . General: 62 y.o. year-old male developed well-nourished in no acute distress.  Alert and oriented x3. . Cardiovascular: Regular rate and rhythm with no rubs or gallops.  No JVD or thyromegaly noted.  Respiratory: Auscultation with no wheezes or rales.  Good respiratory effort. . Abdomen: Soft nontender nondistended with normal  bowel sounds x4 quadrants. . Musculoskeletal: No lower extremity edema.  2/4 pulses in all 4 extremities.  Right upper extremity aVF. Marland Kitchen Psychiatry: Mood is appropriate for condition and setting   Data Reviewed: CBC: Recent Labs  Lab 01/01/18 0941 01/03/18 0227 01/04/18 0221  WBC  --  5.2 6.4  HGB 12.2* 10.2* 10.3*  HCT 36.0* 32.0* 32.4*  MCV  --  92.2 93.1  PLT  --  137* 101*   Basic Metabolic Panel: Recent Labs  Lab 01/01/18 0941 01/01/18 1117 01/03/18 0227 01/04/18 0221  NA 127* 130* 129* 126*  K 4.5 4.1 5.1 5.5*  CL  --  90* 88* 87*  CO2  --  25 26 22   GLUCOSE 75 84 70 60*  BUN  --  38* 62* 74*  CREATININE  --  6.94* 9.68* 10.47*  CALCIUM  --  8.7* 8.6* 8.4*  PHOS  --   --   --  5.5*   GFR: Estimated Creatinine Clearance: 7.1 mL/min (A) (by C-G formula based on SCr of 10.47 mg/dL (H)). Liver Function Tests: Recent Labs  Lab 01/01/18 1117 01/04/18 0221  AST 19  --   ALT 22  --   ALKPHOS 57  --   BILITOT 0.7  --   PROT 8.8*  --   ALBUMIN 3.4* 3.2*   No results for input(s): LIPASE, AMYLASE in the last 168 hours. No results for input(s): AMMONIA in the last 168 hours. Coagulation Profile: Recent Labs  Lab 01/03/18 0227  INR 1.33   Cardiac Enzymes: Recent Labs  Lab 01/01/18 1117 01/01/18 1652 01/01/18 2103 01/02/18 0338  TROPONINI 0.04* 0.03* 0.04* 0.03*   BNP (last 3 results) No results for input(s): PROBNP in the last 8760 hours. HbA1C: No results for input(s): HGBA1C in the last 72 hours. CBG: Recent Labs  Lab 01/04/18 0408 01/04/18 0600 01/04/18 0725 01/04/18 1206 01/04/18 1245  GLUCAP 87 71 90 68* 77   Lipid Profile: No results for input(s): CHOL, HDL, LDLCALC, TRIG, CHOLHDL, LDLDIRECT in the last 72 hours. Thyroid Function Tests: No results for input(s): TSH, T4TOTAL, FREET4, T3FREE, THYROIDAB in the last 72 hours. Anemia Panel: No results for input(s): VITAMINB12, FOLATE, FERRITIN, TIBC, IRON, RETICCTPCT in the last 72  hours. Urine analysis:    Component Value Date/Time   COLORURINE YELLOW 03/12/2015 0020   APPEARANCEUR CLEAR 03/12/2015 0020   LABSPEC 1.015 03/12/2015 0020   PHURINE 5.5 03/12/2015 0020   GLUCOSEU NEGATIVE 03/12/2015 0020   HGBUR SMALL (A) 03/12/2015 0020   BILIRUBINUR NEGATIVE 03/12/2015 0020   KETONESUR NEGATIVE 03/12/2015 0020   PROTEINUR >300 (A) 03/12/2015 0020   UROBILINOGEN 1.0 08/03/2014 1855   NITRITE NEGATIVE 03/12/2015 0020   LEUKOCYTESUR NEGATIVE 03/12/2015 0020   Sepsis Labs: @LABRCNTIP (procalcitonin:4,lacticidven:4)  ) Recent Results (from the past 240 hour(s))  Surgical PCR screen     Status: None   Collection Time: 01/01/18  5:49 PM  Result Value Ref Range Status   MRSA, PCR NEGATIVE NEGATIVE Final   Staphylococcus aureus NEGATIVE NEGATIVE Final    Comment: (NOTE) The Xpert SA Assay (FDA approved for NASAL specimens in patients 64 years of age and older), is one component of a comprehensive surveillance program. It is not intended to diagnose infection nor  to guide or monitor treatment. Performed at [facility], [place] [address], [place], [place] [address]       Studies: No results found.  Scheduled Meds: . amLODipine  10 mg Oral Daily  . aspirin EC  81 mg Oral Daily  . calcium acetate  2,001 mg Oral TID WC  . Chlorhexidine Gluconate Cloth  6 each Topical Q0600  . cinacalcet  30 mg Oral Q supper  . heparin  5,000 Units Subcutaneous BID  . multivitamin  1 tablet Oral QHS  . pantoprazole  40 mg Oral Daily  . rosuvastatin  20 mg Oral q1800    Continuous Infusions: . sodium chloride 10 mL/hr at 01/03/18 1032  .  ceFAZolin (ANCEF) IV       LOS: 3 days     Kayleen Memos, MD [facility] Pager [phone]  If 7PM-7AM, please contact night-coverage [email or web address] Password [facility] 01/04/2018, 5:56 PM

## 2018-01-05 LAB — CBC
HEMATOCRIT: 31.7 % — AB (ref 39.0–52.0)
Hemoglobin: 10.2 g/dL — ABNORMAL LOW (ref 13.0–17.0)
MCH: 30.3 pg (ref 26.0–34.0)
MCHC: 32.2 g/dL (ref 30.0–36.0)
MCV: 94.1 fL (ref 80.0–100.0)
Platelets: 115 10*3/uL — ABNORMAL LOW (ref 150–400)
RBC: 3.37 MIL/uL — ABNORMAL LOW (ref 4.22–5.81)
RDW: 14.5 % (ref 11.5–15.5)
WBC: 4.4 10*3/uL (ref 4.0–10.5)
nRBC: 0 % (ref 0.0–0.2)

## 2018-01-05 LAB — GLUCOSE, CAPILLARY
Glucose-Capillary: 66 mg/dL — ABNORMAL LOW (ref 70–99)
Glucose-Capillary: 104 mg/dL — ABNORMAL HIGH (ref 70–99)

## 2018-01-05 LAB — RENAL FUNCTION PANEL
ANION GAP: 13 (ref 5–15)
Albumin: 3.1 g/dL — ABNORMAL LOW (ref 3.5–5.0)
BUN: 42 mg/dL — ABNORMAL HIGH (ref 8–23)
CO2: 25 mmol/L (ref 22–32)
Calcium: 8.4 mg/dL — ABNORMAL LOW (ref 8.9–10.3)
Chloride: 90 mmol/L — ABNORMAL LOW (ref 98–111)
Creatinine, Ser: 7.57 mg/dL — ABNORMAL HIGH (ref 0.61–1.24)
GFR calc Af Amer: 8 mL/min — ABNORMAL LOW (ref >60)
GFR calc non Af Amer: 7 mL/min — ABNORMAL LOW (ref >60)
Glucose, Bld: 92 mg/dL (ref 70–99)
Phosphorus: 3.3 mg/dL (ref 2.5–4.6)
Potassium: 4.7 mmol/L (ref 3.5–5.1)
Sodium: 128 mmol/L — ABNORMAL LOW (ref 135–145)

## 2018-01-05 MED ORDER — AMLODIPINE BESYLATE 10 MG PO TABS: 10.0000 mg | ORAL_TABLET | Freq: Every day | ORAL | 0 refills | Status: DC

## 2018-01-05 MED ORDER — CLOPIDOGREL BISULFATE 75 MG PO TABS: 75.0000 mg | ORAL_TABLET | Freq: Every day | ORAL | 0 refills | Status: DC

## 2018-01-05 MED ORDER — ROSUVASTATIN CALCIUM 20 MG PO TABS: 20.0000 mg | ORAL_TABLET | Freq: Every day | ORAL | 0 refills | Status: AC

## 2018-01-05 NOTE — Discharge Summary (Addendum)
Discharge Summary  Eric [name] PZW:[id number] DOB: Mar 24, 1955  PCP: Eric Perna, NP  Admit date: 01/01/2018 Discharge date: 01/05/2018  Time spent: 35 minutes  Recommendations for Outpatient Follow-up:  1. Follow-up with your cardiologist 2. Follow-up with your PCP 3. Follow-up with nephrology 4. Keep your hemodialysis appointment 5. Take your medications as prescribed  Discharge Diagnoses:  Active Hospital Problems   Diagnosis Date Noted  . ACS (acute coronary syndrome) ([place]) 01/01/2018    Resolved Hospital Problems  No resolved problems to display.    Discharge Condition: Stable  Diet recommendation: Resume previous diet; renal dialysis diet  Vitals:   01/05/18 1000 01/05/18 1030  BP: (!) 86/51 104/60  Pulse: 74 73  Resp: 20 14  Temp:    SpO2:      History of present illness:  [name] a62 y.o.male,with past medical history significant for congestive heart failure, systolic, status post defibrillator placement, history of end-stage renal disease on hemodialysis Tuesday Thursday Saturday who was admitted to the hospital for revision of right upper extremity fistula. Patient started having chest pressure while having IV fluids administered. No associated nausea vomiting but there was mild shortness of breath. His fistula revision was canceled and cardiology was consulted. EKG reviewed with no acute specific ST changes.   Hospital course complicated by intermittent chest pain.  Work-up negative for ACS.  Also complicated by AV fistula malfunction.  Vascular surgery consulted and followed.  Had a revision of his right upper arm AV fistula on 01/21/2018.    01/05/2018: Patient seen and examined with his wife at bedside.  No acute events overnight.  Had hemodialysis yesterday 01/04/2018 and again today to keep his dialysis schedule.  On the day of discharge, the patient was hemodynamically stable.  He will need to follow-up with his PCP,  cardiology, nephrology, keep his hemodialysis appointments and take his medications as prescribed.    Hospital Course:  Active Problems:   ACS (acute coronary syndrome) (HCC)  Chest pain rule out ACS ACS has not been ruled in Intermittent with unremarkable twelve-lead EKG and troponins Cardiology was consulted and signed off on 01/02/2018 Suspect musculoskeletal; use Tylenol as needed Follow-up with cardiology outpatient  Right upper extremity fistula malfunction Vascular surgery followed Right upper extremity AV fistula revision done on 01/04/2018 Dialysis on 01/21/2018 and 42/35/3614  Chronic systolic CHF Last 2D echo done on 03/11/2015 revealed LVEF 45 to 50% with moderately increased pulmonary arteries pressure with hypokinesis of the inferior lateral myocardium Continue cardiac medications as recommended by cardiology  Mild pulmonary edema, improving Independently reviewed chest x-ray done on 01/01/2018 which revealed mild cardiomegaly with increase of pulmonary vascularity suggestive of mild pulmonary edema Patient will be hemodialyzed again tomorrow 01/05/2018  Hyponatremia Sodium 126 from 129  Fluid status addressed at hemodialysis Keep your hemodialysis appointments  Resolving uncontrolled hypertension Continue Norvasc 10 mg daily  Monitor vital signs after dialysis Blood pressure may drop after dialysis  End-stage renal disease on dialysis Tuesday Thursday Saturday Keep your hemodialysis appointments  Coronary artery disease  Continue aspirin, plavix, and Crestor  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 General:  62 y.o. year-old male well developed well nourished  in no acute distress.  Alert and oriented x3. . Cardiovascular: Regular rate and rhythm with no rubs or gallops.  No thyromegaly or JVD noted.   Marland Kitchen Respiratory: Clear to auscultation with no wheezes or rales. Good inspiratory effort. . Abdomen: Soft nontender nondistended with normal bowel sounds x4 quadrants. . Musculoskeletal: No lower extremity edema. 2/4 pulses in all 4 extremities. . Skin: No ulcerative lesions noted or rashes, . Psychiatry: Mood is appropriate for condition and setting  Discharge Instructions You were cared for by a hospitalist during your hospital stay. If you have any questions about your discharge medications or the care you received while you were in the hospital after you are discharged, you can call the unit and asked to speak with the hospitalist on call if the hospitalist that took care of you is not available. Once you are discharged, your primary care physician will handle any further medical issues. Please note that NO REFILLS for any discharge medications will be authorized once you are discharged, as it is imperative that you return to your primary care physician (or establish a relationship with a primary care physician if you do not have one) for your aftercare needs so that they can reassess your need for medications and monitor your lab values.   Allergies as of 01/05/2018      Reactions   Gabapentin Diarrhea   diarhea      Medication List    STOP taking these medications   calcium acetate 667 MG capsule Commonly known as:  PHOSLO   clopidogrel 75 MG tablet Commonly known as:  PLAVIX     TAKE these medications   acetaminophen 325 MG tablet Commonly known as:  TYLENOL Take 2 tablets (650 mg total) by mouth every 6 (six) hours as needed for mild pain or moderate pain.   amLODipine 10 MG tablet Commonly known as:  NORVASC Take 1 tablet (10 mg total) by mouth daily.   aspirin EC 81 MG tablet Take 81 mg by mouth daily.   cinacalcet 30 MG  tablet Commonly known as:  SENSIPAR Take 30 mg by mouth daily with supper.   hydrALAZINE 10 MG tablet Commonly known as:  APRESOLINE Take 10 mg by mouth daily as needed (anxiety).   multivitamin Tabs tablet Take 1 tablet by mouth at bedtime.   pantoprazole 40 MG tablet Commonly known as:  PROTONIX Take 40 mg by mouth daily.   rosuvastatin 20 MG tablet Commonly known as:  CRESTOR Take 1 tablet (20 mg total) by mouth daily at 6 PM.      Allergies  Allergen Reactions  . Gabapentin Diarrhea    diarhea   Follow-up Information    Patwardhan, Reynold Bowen, MD. Call.   Specialty:  Cardiology Contact information: [address] [place] [address] [phone]        Eric Perna, NP. Call in 1 day(s).   Specialty:  Internal Medicine Why:  Please call for post hospital follow-up appointment Contact information: [place] [address] [phone]            The results of significant diagnostics from this hospitalization (including imaging, microbiology, ancillary and laboratory) are listed below for reference.    Significant Diagnostic Studies: Dg Chest Port 1 View  Result Date: 01/01/2018 CLINICAL DATA:  Preoperative evaluation. History of CHF, pneumonia and end-stage renal disease on dialysis. EXAM: PORTABLE CHEST 1 VIEW COMPARISON:  Chest radiograph  November 12, 2015 FINDINGS: Cardiac silhouette is mildly enlarged and unchanged. Mediastinal silhouette is not suspicious. Status post median sternotomy for CABG. Single lead LEFT AICD tip projects and RIGHT ventricle. Faint LEFT lung base strandy densities without pleural effusion or focal consolidation. No pneumothorax. Soft tissue planes and included osseous structures are non suspicious. IMPRESSION: Mild cardiomegaly.  Minimal LEFT lung base atelectasis/scarring. Electronically Signed   By: Elon Alas M.D.   On: 01/01/2018 16:05    Microbiology: Recent Results (from the past 240  hour(s))  Surgical PCR screen     Status: None   Collection Time: 01/01/18  5:49 PM  Result Value Ref Range Status   MRSA, PCR NEGATIVE NEGATIVE Final   Staphylococcus aureus NEGATIVE NEGATIVE Final    Comment: (NOTE) The Xpert SA Assay (FDA approved for NASAL specimens in patients 34 years of age and older), is one component of a comprehensive surveillance program. It is not intended to diagnose infection nor to guide or monitor treatment. Performed at [facility], [place] [address] [place], [place] [address]      Labs: Basic Metabolic Panel: Recent Labs  Lab 01/01/18 0941 01/01/18 1117 01/03/18 0227 01/04/18 0221 01/05/18 0938  NA 127* 130* 129* 126* 128*  K 4.5 4.1 5.1 5.5* 4.7  CL  --  90* 88* 87* 90*  CO2  --  25 26 22 25   GLUCOSE 75 84 70 60* 92  BUN  --  38* 62* 74* 42*  CREATININE  --  6.94* 9.68* 10.47* 7.57*  CALCIUM  --  8.7* 8.6* 8.4* 8.4*  PHOS  --   --   --  5.5* 3.3   Liver Function Tests: Recent Labs  Lab 01/01/18 1117 01/04/18 0221 01/05/18 0938  AST 19  --   --   ALT 22  --   --   ALKPHOS 57  --   --   BILITOT 0.7  --   --   PROT 8.8*  --   --   ALBUMIN 3.4* 3.2* 3.1*   No results for input(s): LIPASE, AMYLASE in the last 168 hours. No results for input(s): AMMONIA in the last 168 hours. CBC: Recent Labs  Lab 01/01/18 0941 01/03/18 0227 01/04/18 0221 01/05/18 0938  WBC  --  5.2 6.4 4.4  HGB 12.2* 10.2* 10.3* 10.2*  HCT 36.0* 32.0* 32.4* 31.7*  MCV  --  92.2 93.1 94.1  PLT  --  137* 148* PENDING   Cardiac Enzymes: Recent Labs  Lab 01/01/18 1117 01/01/18 1652 01/01/18 2103 01/02/18 0338  TROPONINI 0.04* 0.03* 0.04* 0.03*   BNP: BNP (last 3 results) No results for input(s): BNP in the last 8760 hours.  ProBNP (last 3 results) No results for input(s): PROBNP in the last 8760 hours.  CBG: Recent Labs  Lab 01/04/18 1206 01/04/18 1245 01/04/18 1755 01/04/18 2115 01/05/18 0655  GLUCAP 68* 77 76 151* 66*        Signed:  Kayleen Memos, MD [facility] 01/05/2018, 10:45 AM

## 2018-01-05 NOTE — Progress Notes (Addendum)
Subjective:  No cos on HD today , no cp, tolerated cannulation yesterday  and today avf on hd   Objective Vital signs in last 24 hours: Vitals:   01/04/18 1715 01/04/18 1759 01/04/18 1936 01/05/18 0503  BP: 136/72 (!) 158/80 (!) 142/80 (!) 117/104  Pulse: 82 84 87 79  Resp: 18 15 18  (!) 21  Temp: 98.2 F (36.8 C) 98.5 F (36.9 C) 98.5 F (36.9 C) 98.5 F (36.9 C)  TempSrc: Oral Oral Oral Oral  SpO2: 98% 98% 99% 98%  Weight: 75.2 kg     Height:       Weight change: 5.264 kg  Physical Exam: General: ON HD ,Alert NAD,Male , appropriate and calm  Heart: RRR , no m,r,g Lungs: CTA Abdomen: BS pos. , soft , nt, nd Extremities: Dialysis Access: RUA AVF  Patent on HD 300 bfr   Dialysis: TTS [place] 3.5h 400/1.5 72.5kg 2/2.5 bath AVF RUA Hep none - Mircera 100 q 2 weeks (last 12/9) - Hectoral 16mcg IV q HD - Venofer 50mg  IV weekly  Problem/Plan: 1. Chest pain (unstable angina): CP has resolved/ trop's flat and EKG's neg per cardiology, no plan for intervention.Card  Signed off 01/02/18 and  Dr Nigel Mormon = "Recommend patient to contact our office after discharge to re establish cardiac care." 2. R AVF ulceration: Revision12/13/19 Dr Donzetta Matters .Able to stick away from surgical site today and yesterday.  3. ESRD:TTS. Hd yest and today with out difficulties ok for dc home  From renal standpoint  4. Hypertension/volume:BP variable, no edema on exam. Fu wts /bp after hd today  5. Anemia:Hgb 12.2,>10.3  just dosed with ESA on 61/9. 6. Metabolic bone disease:Ca ok, Phos 5.5 . Continue home meds. 7. CAD (Hx CABG, HFrEF) 8. Hx VT/F arrest: with AICD in place  Ernest Haber, PA-C [place] [phone] 01/05/2018,10:12 AM  LOS: 4 days   Pt seen, examined and agree w A/P as above.  Kelly Splinter MD [facility] pager [phone]   01/05/2018, 1:41 PM    Labs: Basic Metabolic Panel: Recent Labs  Lab 01/03/18 0227  01/04/18 0221 01/05/18 0938  NA 129* 126* 128*  K 5.1 5.5* 4.7  CL 88* 87* 90*  CO2 26 22 25   GLUCOSE 70 60* 92  BUN 62* 74* 42*  CREATININE 9.68* 10.47* 7.57*  CALCIUM 8.6* 8.4* 8.4*  PHOS  --  5.5* 3.3   Liver Function Tests: Recent Labs  Lab 01/01/18 1117 01/04/18 0221 01/05/18 0938  AST 19  --   --   ALT 22  --   --   ALKPHOS 57  --   --   BILITOT 0.7  --   --   PROT 8.8*  --   --   ALBUMIN 3.4* 3.2* 3.1*   No results for input(s): LIPASE, AMYLASE in the last 168 hours. No results for input(s): AMMONIA in the last 168 hours. CBC: Recent Labs  Lab 01/01/18 0941 01/03/18 0227 01/04/18 0221  WBC  --  5.2 6.4  HGB 12.2* 10.2* 10.3*  HCT 36.0* 32.0* 32.4*  MCV  --  92.2 93.1  PLT  --  137* 148*   Cardiac Enzymes: Recent Labs  Lab 01/01/18 1117 01/01/18 1652 01/01/18 2103 01/02/18 0338  TROPONINI 0.04* 0.03* 0.04* 0.03*   CBG: Recent Labs  Lab 01/04/18 1206 01/04/18 1245 01/04/18 1755 01/04/18 2115 01/05/18 0655  GLUCAP 68* 77 76 151* 66*    Studies/Results: No results found. Medications: .  sodium chloride 10 mL/hr at 01/03/18 1032  .  ceFAZolin (ANCEF) IV     . amLODipine  10 mg Oral Daily  . aspirin EC  81 mg Oral Daily  . calcium acetate  2,001 mg Oral TID WC  . Chlorhexidine Gluconate Cloth  6 each Topical Q0600  . cinacalcet  30 mg Oral Q supper  . heparin  5,000 Units Subcutaneous BID  . multivitamin  1 tablet Oral QHS  . pantoprazole  40 mg Oral Daily  . rosuvastatin  20 mg Oral q1800

## 2018-01-05 NOTE — Discharge Instructions (Signed)
Chest Wall Pain Chest wall pain is pain in or around the bones and muscles of your chest. Sometimes, an injury causes this pain. Sometimes, the cause may not be known. This pain may take several weeks or longer to get better. Follow these instructions at home: Pay attention to any changes in your symptoms. Take these actions to help with your pain:  Rest as told by your doctor.  Avoid activities that cause pain. Try not to use your chest, belly (abdominal), or side muscles to lift heavy things.  If directed, apply ice to the painful area: ? Put ice in a plastic bag. ? Place a towel between your skin and the bag. ? Leave the ice on for 20 minutes, 2-3 times per day.  Take over-the-counter and prescription medicines only as told by your doctor.  Do not use tobacco products, including cigarettes, chewing tobacco, and e-cigarettes. If you need help quitting, ask your doctor.  Keep all follow-up visits as told by your doctor. This is important.  Contact a doctor if:  You have a fever.  Your chest pain gets worse.  You have new symptoms. Get help right away if:  You feel sick to your stomach (nauseous) or you throw up (vomit).  You feel sweaty or light-headed.  You have a cough with phlegm (sputum) or you cough up blood.  You are short of breath. This information is not intended to replace advice given to you by your health care provider. Make sure you discuss any questions you have with your health care provider. Document Released: 06/28/2007 Document Revised: 06/17/2015 Document Reviewed: 04/06/2014 Elsevier Interactive Patient Education  2018 [employer].   Nonspecific Chest Pain Chest pain can be caused by many different conditions. There is a chance that your pain could be related to something serious, such as a heart attack or a blood clot in your lungs. Chest pain can also be caused by conditions that are not life-threatening. If you have chest pain, it is very  important to follow up with your doctor. Follow these instructions at home: Medicines  If you were prescribed an antibiotic medicine, take it as told by your doctor. Do not stop taking the antibiotic even if you start to feel better.  Take over-the-counter and prescription medicines only as told by your doctor. Lifestyle  Do not use any products that contain nicotine or tobacco, such as cigarettes and e-cigarettes. If you need help quitting, ask your doctor.  Do not drink alcohol.  Make lifestyle changes as told by your doctor. These may include: ? Getting regular exercise. Ask your doctor for some activities that are safe for you. ? Eating a heart-healthy diet. A diet specialist (dietitian) can help you to learn healthy eating options. ? Staying at a healthy weight. ? Managing diabetes, if needed. ? Lowering your stress, as with deep breathing or spending time in nature. General instructions  Avoid any activities that make you feel chest pain.  If your chest pain is because of heartburn: ? Raise (elevate) the head of your bed about 6 inches (15 cm). You can do this by putting blocks under the bed legs at the head of the bed. ? Do not sleep with extra pillows under your head. That does not help heartburn.  Keep all follow-up visits as told by your doctor. This is important. This includes any further testing if your chest pain does not go away. Contact a doctor if:  Your chest pain does not go away.  You have a rash with blisters on your chest.  You have a fever.  You have chills. Get help right away if:  Your chest pain is worse.  You have a cough that gets worse, or you cough up blood.  You have very bad (severe) pain in your belly (abdomen).  You are very weak.  You pass out (faint).  You have either of these for no clear reason: ? Sudden chest discomfort. ? Sudden discomfort in your arms, back, neck, or jaw.  You have shortness of breath at any time.  You  suddenly start to sweat, or your skin gets clammy.  You feel sick to your stomach (nauseous).  You throw up (vomit).  You suddenly feel light-headed or dizzy.  Your heart starts to beat fast, or it feels like it is skipping beats. These symptoms may be an emergency. Do not wait to see if the symptoms will go away. Get medical help right away. Call your local emergency services (911 in the [place] Do not drive yourself to the hospital. This information is not intended to replace advice given to you by your health care provider. Make sure you discuss any questions you have with your health care provider. Document Released: 06/28/2007 Document Revised: 10/04/2015 Document Reviewed: 10/04/2015 Elsevier Interactive Patient Education  2017 [employer].   [facility]  Discharge Instructions  AV Fistula or Graft Surgery for Dialysis Access  Please refer to the following instructions for your post-procedure care. Your surgeon or physician assistant will discuss any changes with you.  Activity  You may drive the day following your surgery, if you are comfortable and no longer taking prescription pain medication. Resume full activity as the soreness in your incision resolves.  Bathing/Showering  You may shower after you go home. Keep your incision dry for 48 hours. Do not soak in a bathtub, hot tub, or swim until the incision heals completely. You may not shower if you have a hemodialysis catheter.  Incision Care  Clean your incision with mild soap and water after 48 hours. Pat the area dry with a clean towel. You do not need a bandage unless otherwise instructed. Do not apply any ointments or creams to your incision. You may have skin glue on your incision. Do not peel it off. It will come off on its own in about one week. Your arm may swell a bit after surgery. To reduce swelling use pillows to elevate your arm so it is above your heart. Your doctor will tell  you if you need to lightly wrap your arm with an ACE bandage.  Diet  Resume your normal diet. There are not special food restrictions following this procedure. In order to heal from your surgery, it is CRITICAL to get adequate nutrition. Your body requires vitamins, minerals, and protein. Vegetables are the best source of vitamins and minerals. Vegetables also provide the perfect balance of protein. Processed food has little nutritional value, so try to avoid this.  Medications  Resume taking all of your medications. If your incision is causing pain, you may take over-the counter pain relievers such as acetaminophen (Tylenol). If you were prescribed a stronger pain medication, please be aware these medications can cause nausea and constipation. Prevent nausea by taking the medication with a snack or meal. Avoid constipation by drinking plenty of fluids and eating foods with high amount of fiber, such as fruits, vegetables, and grains.  Do not take Tylenol if you are taking  prescription pain medications.  Follow up Your surgeon may want to see you in the office following your access surgery. If so, this will be arranged at the time of your surgery.  Please call us immediately for any of the following conditions:  Increased pain, redness, drainage (pus) from your incision site Fever of 101 degrees or higher Severe or worsening pain at your incision site Hand pain or numbness.  Reduce your risk of vascular disease:  Stop smoking. If you would like help, call [facility] at 1-800-QUIT-NOW 913 308 1204) or [place] at [place] your cholesterol Maintain a desired weight Control your diabetes Keep your blood pressure down  Dialysis  It will take several weeks to several months for your new dialysis access to be ready for use. Your surgeon will determine when it is okay to use it. Your nephrologist will continue to direct your dialysis. You can continue to use your Permcath  until your new access is ready for use.   01/03/2018 Honest Vanleer [id number] Aug 06, 1955  Surgeon(s): Waynetta Sandy, MD  Procedure(s): REVISION OF ARTERIOVENOUS FISTULA  x May stick graft on designated area only:  Do NOT stick over incisions x 6 weeks. May stick below incisions now.   If you have any questions, please call the office at [phone].

## 2018-04-01 ENCOUNTER — Ambulatory Visit (INDEPENDENT_AMBULATORY_CARE_PROVIDER_SITE_OTHER): Payer: Medicare Other | Admitting: Cardiology

## 2018-04-01 ENCOUNTER — Encounter: Payer: Self-pay | Admitting: Cardiology

## 2018-04-01 VITALS — BP 153/85 | HR 80 | Ht 66.0 in | Wt 176.4 lb

## 2018-04-01 DIAGNOSIS — I2510 Atherosclerotic heart disease of native coronary artery without angina pectoris: Secondary | ICD-10-CM

## 2018-04-01 DIAGNOSIS — R0789 Other chest pain: Secondary | ICD-10-CM

## 2018-04-01 DIAGNOSIS — Z992 Dependence on renal dialysis: Secondary | ICD-10-CM

## 2018-04-01 DIAGNOSIS — I739 Peripheral vascular disease, unspecified: Secondary | ICD-10-CM | POA: Insufficient documentation

## 2018-04-01 DIAGNOSIS — E1140 Type 2 diabetes mellitus with diabetic neuropathy, unspecified: Secondary | ICD-10-CM

## 2018-04-01 DIAGNOSIS — I5032 Chronic diastolic (congestive) heart failure: Secondary | ICD-10-CM

## 2018-04-01 DIAGNOSIS — N186 End stage renal disease: Secondary | ICD-10-CM

## 2018-04-01 DIAGNOSIS — Z951 Presence of aortocoronary bypass graft: Secondary | ICD-10-CM

## 2018-04-01 DIAGNOSIS — Z95810 Presence of automatic (implantable) cardiac defibrillator: Secondary | ICD-10-CM

## 2018-04-01 MED ORDER — GABAPENTIN 300 MG PO CAPS: 300.0000 mg | ORAL_CAPSULE | Freq: Two times a day (BID) | ORAL | 2 refills | Status: AC

## 2018-04-01 NOTE — Progress Notes (Signed)
Subjective:   Eric [name], male    DOB: 12-07-1955, 63 y.o.   MRN: [id number]  Patient, No Pcp Per:  Chief Complaint  Patient presents with  . Coronary Artery Disease  . Claudication  . Follow-up    28mo    HPI: Eric [name]  is a 63 y.o. male  With known CAD with CABG in 1999 also had coronary angiogram in 2015 with diffuse distal LAD disease and RCA stenosis not amenable for PCI due to diffuse disease. Had patent LIMA to LAD and SVG to OM1. He also has history of cardiac arrest with AICD implantation with [employer] Jude defibrillator in 2015, uncontrolled type 2 diabetes, chronic Hepatitis C and hepatic cirrhosis, hypertension, uncontrolled type 2 diabetes, diastolic heart failure with preserved LVEF by echo in 2016 and moderate pulmonary hypertension. Patient was last seen by [place] in 2016, but moved to [facility] and has just recently moved back to [place] in Oct 2019.  Patient recently reestablished with [place]. Reported occasional episodes of chest pain felt to be atypical. He underwent AV fistula revision on 01/21/2018 that he tolerated well. He underwent echo and LE arterial duplex and presents to discuss results.   Continues to tolerate dialysis well, with occasionally having to be given IV fluids for hypotension.  He does have tingling/numbness in his legs, but also states that he has cramping in his legs with walking and also at night. No ulcerations. Wife has been pushing him to walk more and he is tolerating this fairly well. He was encouraged to start Gabapentin at last OV; however, patient reports that he did not receive the prescription.   He does continue to have some brief episodes of chest discomfort that radiates down through to abdomen and legs. Does not occur with exertion. No associated shortness of breath. He has not established with Nephrology, Neurology, or PCP as of yet.   He was followed by Cardiology in [place], but their report no recent testing. Have not  received all records yet. Patient wife reports that patient was scheduled for AV fistula repair while in [place]; however, had some type of perioperative complication and wife states that they "almost lost him". Seems that he had a long hospital stay and rehab after that.   Past Medical History:  Diagnosis Date  . AICD (automatic cardioverter/defibrillator) present   . Cardiac arrest due to underlying cardiac condition ([place])   . CHF (congestive heart failure) ([place])   . CKD (chronic kidney disease), stage III ([place])   . Complication of anesthesia    got too much anesthesia and bp dropped very low Feb, 2019  . Edema   . ESRD on dialysis ([place])   . History of blood transfusion 1950's   "related to pinal menigitis; HAD 16 OPERATIONS TOTAL"  . Hyperlipemia   . Hypertension   . Ileus ([place]) 05/2015  . Myocardial infarction ([place]) 03/2013  . Obesity   . Pneumonia 2016  . Scabies infestation 06/29/2014  . Shortness of breath dyspnea   . Type II diabetes mellitus ([place])    type 2  . Ventricular tachycardia [facility])     Past Surgical History:  Procedure Laterality Date  . AV FISTULA PLACEMENT Right 03/18/2015   Procedure: BRACHIOCEPHALIC ARTERIOVENOUS (AV) FISTULA CREATION;  Surgeon: Angelia Mould, MD;  Location: [place];  Service: Vascular;  Laterality: Right;  . BACK SURGERY  1950's   "for spinal menigitis; HAD 16 OPERATIONS TOTAL"  . CARDIAC CATHETERIZATION    .  CHOLECYSTECTOMY N/A 06/17/2015   Procedure: LAPAROSCOPIC CHOLECYSTECTOMY;  Surgeon: Coralie Keens, MD;  Location: [place];  Service: General;  Laterality: N/A;  . CORONARY ARTERY BYPASS GRAFT  1999   cabg x4  . 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 HEAD SURGERY  1950'S   "for spinal menigitis; HAD 16 OPERATIONS TOTAL"  . IMPLANTABLE CARDIOVERTER DEFIBRILLATOR IMPLANT  03/24/2013   STJ single chamber ICD implanted by Dr Caryl Comes for secondary prevention  . IMPLANTABLE CARDIOVERTER DEFIBRILLATOR  IMPLANT N/A 03/24/2013   Procedure: IMPLANTABLE CARDIOVERTER DEFIBRILLATOR IMPLANT;  Surgeon: Deboraha Sprang, MD;  Location: [facility] C[facility] LAB;  Service: Cardiovascular;  Laterality: N/A;  . INSERTION OF DIALYSIS CATHETER N/A 03/13/2015   Procedure: INSERTION OF TUNNELED DIALYSIS CATHETER;  Surgeon: Conrad Baring, MD;  Location: [place];  Service: Vascular;  Laterality: N/A;  . LEFT HEART CATHETERIZATION WITH CORONARY/GRAFT ANGIOGRAM  03/07/2013   Procedure: LEFT HEART CATHETERIZATION WITH CORONARY/GRAFT ANGIOGRAM;  Surgeon: Clent Demark, MD;  Location: [place] CATH LAB;  Service: Cardiovascular;;  . REVISON OF ARTERIOVENOUS FISTULA Right 01/03/2018   Procedure: REVISION OF ARTERIOVENOUS FISTULA;  Surgeon: Waynetta Sandy, MD;  Location: [place];  Service: Vascular;  Laterality: Right;  . UMBILICAL HERNIA REPAIR N/A 06/24/2015   Procedure: HERNIA REPAIR UMBILICAL ADULT;  Surgeon: Georganna Skeans, MD;  Location: [facility] OR;  Service: General;  Laterality: N/A;    Family History  Problem Relation Age of Onset  . Heart disease Mother   . Diabetes Mother     Social History   Socioeconomic History  . Marital status: Married    Spouse name: Not on file  . Number of children: 1  . Years of education: Not on file  . Highest education level: Not on file  Occupational History  . Occupation: N/A  Social Needs  . Financial resource strain: Not on file  . Food insecurity:    Worry: Not on file    Inability: Not on file  . Transportation needs:    Medical: Not on file    Non-medical: Not on file  Tobacco Use  . Smoking status: Never Smoker  . Smokeless tobacco: Never Used  Substance and Sexual Activity  . Alcohol use: No    Alcohol/week: 0.0 standard drinks  . Drug use: No  . Sexual activity: Not Currently  Lifestyle  . Physical activity:    Days per week: Not on file    Minutes per session: Not on file  . Stress: Not on file  Relationships  . Social connections:    Talks on phone: Not on file      Gets together: Not on file    Attends religious service: Not on file    Active member of club or organization: Not on file    Attends meetings of clubs or organizations: Not on file    Relationship status: Not on file  . Intimate partner violence:    Fear of current or ex partner: Not on file    Emotionally abused: Not on file    Physically abused: Not on file    Forced sexual activity: Not on file  Other Topics Concern  . Not on file  Social History Narrative   Lives at home w/ his wife and daughter   Right-handed   Caffeine: none    Current Meds  Medication Sig  . acetaminophen (TYLENOL) 325 MG tablet Take 2 tablets (650 mg total) by mouth every 6 (  six) hours as needed for mild pain or moderate pain.  Marland Kitchen aspirin EC 81 MG tablet Take 81 mg by mouth daily.   . cinacalcet (SENSIPAR) 30 MG tablet Take 30 mg by mouth daily with supper.  . hydrALAZINE (APRESOLINE) 10 MG tablet Take 10 mg by mouth daily as needed (anxiety).  . isosorbide mononitrate (IMDUR) 60 MG 24 hr tablet Take 60 mg by mouth continuous as needed.  . multivitamin (RENA-VIT) TABS tablet Take 1 tablet by mouth at bedtime.  . pantoprazole (PROTONIX) 40 MG tablet Take 40 mg by mouth continuous as needed.   . rosuvastatin (CRESTOR) 20 MG tablet Take 1 tablet (20 mg total) by mouth daily at 6 PM.     Review of Systems  Constitution: Negative for decreased appetite, malaise/fatigue, weight gain and weight loss.  Eyes: Negative for visual disturbance.  Cardiovascular: Positive for chest pain, claudication and leg swelling. Negative for dyspnea on exertion, orthopnea, palpitations and syncope.  Respiratory: Negative for hemoptysis and wheezing.   Endocrine: Negative for cold intolerance and heat intolerance.  Hematologic/Lymphatic: Does not bruise/bleed easily.  Skin: Negative for nail changes.  Musculoskeletal: Positive for muscle cramps. Negative for muscle weakness and myalgias.  Gastrointestinal: Negative for  abdominal pain, change in bowel habit, nausea and vomiting.  Neurological: Positive for numbness (lower extremities). Negative for difficulty with concentration, dizziness, focal weakness and headaches.  Psychiatric/Behavioral: Positive for altered mental status (wife reports that patient has delerium). Negative for suicidal ideas.  All other systems reviewed and are negative.      Objective:     Blood pressure (!) 153/85, pulse 80, height 5\' 6"  (1.676 m), weight 176 lb 6.4 oz (80 kg), SpO2 97 %.  Cardiac studies:  Echocardiogram 02/20/2018: Left ventricle cavity is normal in size. Mild concentric hypertrophy of the left ventricle. Normal global wall motion. Doppler evidence of grade I (impaired) diastolic dysfunction, normal LAP. Calculated EF 61%. Moderate (Grade II) mitral regurgitation. Mild tricuspid regurgitation. Inadequate TR jet to estimate PA systolic pressure. Normal right atrial pressure.  Lower extremity arterial duplex 02/20/2018: No hemodynamically significant stenoses are identified in the right lower extremity arterial system. Moderate velocity increase at the left mid posterior tibial artery suggests >50% stenosis. Severely abnormal waveforms of the right ankle. Moderately abnormal waveforms of the left ankle. Non-compressible ABI bilateral due to medial calcinosis.  Coronary angio 02/24/2013: Diffuse distal LAD disease, RCA stenosis not felt to be amenable for PCI, patent LIMA to LAD and SVG to OM1.  Physical Exam  Constitutional: He is oriented to person, place, and time. Vital signs are normal. He appears well-developed and well-nourished.  HENT:  Head: Normocephalic and atraumatic.  Neck: Normal range of motion.  Cardiovascular: Normal rate, regular rhythm, normal heart sounds and intact distal pulses.  Pulses:      Carotid pulses are on the right side with bruit and on the left side with bruit.      Femoral pulses are 2+ on the right side and 2+ on the left  side.      Popliteal pulses are 2+ on the right side and 2+ on the left side.       Dorsalis pedis pulses are 0 on the right side and 0 on the left side.       Posterior tibial pulses are 1+ on the right side and 1+ on the left side.  R AV fistula/graft  Reddness noted to right lower extremity. No ischemic changes. Normal cap refill  Pulmonary/Chest: Effort  normal and breath sounds normal. No accessory muscle usage. No respiratory distress.  Abdominal: Soft. Bowel sounds are normal.  Musculoskeletal: Normal range of motion.  Neurological: He is alert and oriented to person, place, and time.  Skin: Skin is warm and dry.  Vitals reviewed.        Assessment & Recommendations:  1. Atherosclerosis of native coronary artery of native heart without angina pectoris Patient is without symptoms of angina.  He is on appropriate medical therapy. I do not have recent stress test report from [place], will request this for our records.  2. Chronic diastolic (congestive) heart failure ([place]) I discussed recently obtained echocardiogram with the patient and his wife, has grade 1 diastolic dysfunction.  Normal LVEF.  Continue with hydralazine and Imdur.  Not on ACE inhibitor or arm therapy in view of chronic kidney disease.  3. Claudication ([place]) I have discussed recently obtained lower extremity arterial duplex results with the patient and his wife, has likely small vessel disease and unless he has significant claudication symptoms would not recommend intervention, but continued aggressive medical therapy.  He is on aspirin and statin therapy.  Could consider Pletal therapy; however, in view of being on dialysis, would like to avoid in view of increased bleeding risk.  I suspect his symptoms may also be contributing from diabetic neuropathy and have started him on gabapentin 300 mg twice daily.  Encouraged him to continue with daily walking to improve his circulation.  No evidence of ischemic limb.  Advised  patient and his wife to contact me for any ulcerations or skin color changes.  4. Atypical chest pain Continue to feel that symptoms are atypical as they do not occur with exertion.  Do not feel that he needs stress testing at this point.  No wall motion abnormalities were seen on echocardiogram.  5. S/P CABG (coronary artery bypass graft) No symptoms of angina.  On aspirin therapy.  6. AICD (automatic cardioverter/defibrillator) present I do not have previous transmissions from [place], will request these for our records.  Patient wife feels that she was last told he had 5 years left on battery life.  We will obtain these records from [place].  7. Controlled type 2 diabetes with neuropathy [facility]) Patient has diet-controlled diabetes.  Feel that his leg pain and also tingling and numbness is related to diabetic neuropathy.  As stated above, will start gabapentin.  Encouraged him to follow-up with PCP for further management and evaluation of diabetes and also neuropathy.  8. ESRD on dialysis [facility]) Patient is overall tolerating dialysis well, has not yet established with nephrology.  Encouraged him to do so.  Plan: I will have patient follow up with Dr. Einar Gip in 8 weeks for follow up on claudication.    Jeri Lager, MSN, APRN, FNP-C [facility], [place] Office: [phone] Fax: [phone]

## 2018-05-02 ENCOUNTER — Encounter: Payer: Medicare Other | Admitting: *Deleted

## 2018-05-02 ENCOUNTER — Other Ambulatory Visit: Payer: Self-pay

## 2018-05-10 ENCOUNTER — Encounter: Payer: Self-pay | Admitting: Cardiology

## 2018-05-29 ENCOUNTER — Ambulatory Visit: Payer: Medicare Other | Admitting: Cardiology

## 2018-05-29 ENCOUNTER — Other Ambulatory Visit: Payer: Self-pay

## 2018-05-29 ENCOUNTER — Ambulatory Visit (INDEPENDENT_AMBULATORY_CARE_PROVIDER_SITE_OTHER): Payer: Medicare Other | Admitting: Cardiology

## 2018-05-29 ENCOUNTER — Encounter: Payer: Self-pay | Admitting: Cardiology

## 2018-05-29 VITALS — BP 176/89 | HR 74 | Temp 97.7°F | Ht 66.0 in | Wt 176.0 lb

## 2018-05-29 DIAGNOSIS — I739 Peripheral vascular disease, unspecified: Secondary | ICD-10-CM

## 2018-05-29 DIAGNOSIS — I2510 Atherosclerotic heart disease of native coronary artery without angina pectoris: Secondary | ICD-10-CM

## 2018-05-29 DIAGNOSIS — I5032 Chronic diastolic (congestive) heart failure: Secondary | ICD-10-CM | POA: Diagnosis not present

## 2018-05-29 DIAGNOSIS — Z951 Presence of aortocoronary bypass graft: Secondary | ICD-10-CM

## 2018-05-29 DIAGNOSIS — I25118 Atherosclerotic heart disease of native coronary artery with other forms of angina pectoris: Secondary | ICD-10-CM

## 2018-05-29 DIAGNOSIS — I10 Essential (primary) hypertension: Secondary | ICD-10-CM

## 2018-05-29 DIAGNOSIS — Z95810 Presence of automatic (implantable) cardiac defibrillator: Secondary | ICD-10-CM

## 2018-05-29 MED ORDER — METOPROLOL TARTRATE 25 MG PO TABS: 25.0000 mg | ORAL_TABLET | Freq: Two times a day (BID) | ORAL | 3 refills | Status: AC

## 2018-05-29 NOTE — Progress Notes (Signed)
Primary Physician/Referring:  Patient, No Pcp Per  Patient ID: Eric [name], male    DOB: [birth year], 62 y.o.   MRN: [id number]  Chief Complaint  Patient presents with   Claudication    8 WEEK F/U , needs protonix, and crestor, amlodipine    HPI: Eric [name]  is a 63 y.o. male  with CAD with CABG in 1999, Diffuse disease in distal LAD and RCA, patent LIMA to LAD and SVG to OM1 by angiogram in 2015, H/O cardiac arrest S/P AICD implantation with St. Jude defibrillator in 2015 in [place], uncontrolled type 2 diabetes, chronic Hepatitis C and hepatic cirrhosis, hypertension, uncontrolled type 2 diabetes, diastolic heart failure with preserved LVEF by echo in 2016 and moderate pulmonary hypertension, ESRD on HD. Patient was last seen by [place] in 2016, but moved to [facility] and has just recently moved back to [place] in Oct 2019. I saw him 1 month ago for f/u.  Continues to tolerate dialysis well, with occasionally having to be given IV fluids for hypotension.  He was followed by Cardiology in [place], but their report no recent testing. Patient had complicated course during recent AV fistula revision in December 2019 and had a prolonged course.  Also had episodes of chest discomfort and hypotension.  He now presents here for follow-up of coronary artery disease and peripheral arterial disease.     He started, pending and since then has noticed improvement in leg pain bilaterally.  He has noted a significant ischemia or ectopy edema, chest pain or palpitation.  States that he is presently doing well.  Past Medical History:  Diagnosis Date   AICD (automatic cardioverter/defibrillator) present    Cardiac arrest due to underlying cardiac condition ([place])    CHF (congestive heart failure) ([place])    CKD (chronic kidney disease), stage III ([place])    Complication of anesthesia    got too much anesthesia and bp dropped very low Feb, 2019   Edema    ESRD on dialysis [facility])    History of  blood transfusion 1950's   "related to pinal menigitis; HAD 16 OPERATIONS TOTAL"   Hyperlipemia    Hypertension    Ileus ([place]) 05/2015   Myocardial infarction ([place]) 03/2013   Obesity    Pneumonia 2016   Scabies infestation 06/29/2014   Shortness of breath dyspnea    Type II diabetes mellitus ([place])    type 2   Ventricular tachycardia ([place])     Past Surgical History:  Procedure Laterality Date   AV FISTULA PLACEMENT Right 03/18/2015   Procedure: BRACHIOCEPHALIC ARTERIOVENOUS (AV) FISTULA CREATION;  Surgeon: Angelia Mould, MD;  Location: [place];  Service: Vascular;  Laterality: Right;   BACK SURGERY  1950's   "for spinal menigitis; HAD 16 OPERATIONS TOTAL"   CARDIAC CATHETERIZATION     CHOLECYSTECTOMY N/A 06/17/2015   Procedure: LAPAROSCOPIC CHOLECYSTECTOMY;  Surgeon: Coralie Keens, MD;  Location: [place];  Service: General;  Laterality: N/A;   CORONARY ARTERY BYPASS GRAFT  1999   cabg x4   EYE SURGERY Bilateral 1950's   "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"   HEAD SURGERY  1950'S   "for spinal menigitis; HAD 16 OPERATIONS TOTAL"   IMPLANTABLE CARDIOVERTER DEFIBRILLATOR IMPLANT  03/24/2013   STJ single chamber ICD implanted by Dr Caryl Comes for secondary prevention   IMPLANTABLE CARDIOVERTER DEFIBRILLATOR IMPLANT N/A 03/24/2013   Procedure: IMPLANTABLE CARDIOVERTER DEFIBRILLATOR IMPLANT;  Surgeon: Deboraha Sprang, MD;  Location: [facility] C[facility] LAB;  Service: Cardiovascular;  Laterality: N/A;   INSERTION OF DIALYSIS CATHETER N/A 03/13/2015   Procedure: INSERTION OF TUNNELED DIALYSIS CATHETER;  Surgeon: Conrad Lake Linden, MD;  Location: [place];  Service: Vascular;  Laterality: N/A;   LEFT HEART CATHETERIZATION WITH CORONARY/GRAFT ANGIOGRAM  03/07/2013   Procedure: LEFT HEART CATHETERIZATION WITH [name];  Surgeon: Clent Demark, MD;  Location: [place] CATH LAB;  Service: Cardiovascular;;   REVISON OF ARTERIOVENOUS FISTULA Right 01/03/2018   Procedure: REVISION OF  ARTERIOVENOUS FISTULA;  Surgeon: Waynetta Sandy, MD;  Location: [place];  Service: Vascular;  Laterality: Right;   UMBILICAL HERNIA REPAIR N/A 06/24/2015   Procedure: HERNIA REPAIR UMBILICAL ADULT;  Surgeon: Georganna Skeans, MD;  Location: [facility] OR;  Service: General;  Laterality: N/A;    Social History   Socioeconomic History   Marital status: Married    Spouse name: Not on file   Number of children: 1   Years of education: Not on file   Highest education level: Not on file  Occupational History   Occupation: N/A  Social Needs   Financial resource strain: Not on file   Food insecurity:    Worry: Not on file    Inability: Not on file   Transportation needs:    Medical: Not on file    Non-medical: Not on file  Tobacco Use   Smoking status: Never Smoker   Smokeless tobacco: Never Used  Substance and Sexual Activity   Alcohol use: No    Alcohol/week: 0.0 standard drinks   Drug use: No   Sexual activity: Not Currently  Lifestyle   Physical activity:    Days per week: Not on file    Minutes per session: Not on file   Stress: Not on file  Relationships   Social connections:    Talks on phone: Not on file    Gets together: Not on file    Attends religious service: Not on file    Active member of club or organization: Not on file    Attends meetings of clubs or organizations: Not on file    Relationship status: Not on file   Intimate partner violence:    Fear of current or ex partner: Not on file    Emotionally abused: Not on file    Physically abused: Not on file    Forced sexual activity: Not on file  Other Topics Concern   Not on file  Social History Narrative   Lives at home w/ his wife and daughter   Right-handed   Caffeine: none    Current Outpatient Medications on File Prior to Visit  Medication Sig Dispense Refill   acetaminophen (TYLENOL) 325 MG tablet Take 2 tablets (650 mg total) by mouth every 6 (six) hours as needed for mild pain  or moderate pain. 120 tablet 0   aspirin EC 81 MG tablet Take 81 mg by mouth daily.      calcium acetate (PHOSLO) 667 MG capsule Take by mouth 3 (three) times daily with meals.     clopidogrel (PLAVIX) 75 MG tablet Take 75 mg by mouth daily.     gabapentin (NEURONTIN) 300 MG capsule Take 1 capsule (300 mg total) by mouth 2 (two) times daily. 60 capsule 2   hydrALAZINE (APRESOLINE) 10 MG tablet Take 10 mg by mouth daily as needed (anxiety).     isosorbide mononitrate (IMDUR) 60 MG 24 hr tablet Take 60 mg by mouth as needed.      multivitamin (RENA-VIT) TABS tablet Take 1  tablet by mouth at bedtime. 30 tablet 0   pantoprazole (PROTONIX) 40 MG tablet Take 40 mg by mouth continuous as needed.      rosuvastatin (CRESTOR) 20 MG tablet Take 1 tablet (20 mg total) by mouth daily at 6 PM. 30 tablet 0   amLODipine (NORVASC) 10 MG tablet Take 10 mg by mouth daily.     cinacalcet (SENSIPAR) 30 MG tablet Take 30 mg by mouth daily with supper.     No current facility-administered medications on file prior to visit.    Review of Systems  Constitution: Negative for decreased appetite, malaise/fatigue, weight gain and weight loss.  Eyes: Negative for visual disturbance.  Cardiovascular: Negative for dyspnea on exertion, leg swelling, orthopnea, palpitations and syncope.  Respiratory: Negative for hemoptysis and wheezing.   Endocrine: Negative for cold intolerance and heat intolerance.  Hematologic/Lymphatic: Does not bruise/bleed easily.  Skin: Negative for nail changes.  Musculoskeletal: Positive for muscle cramps. Negative for muscle weakness and myalgias.  Gastrointestinal: Negative for abdominal pain, change in bowel habit, nausea and vomiting.  Neurological: Positive for numbness (lower extremities). Negative for difficulty with concentration, dizziness, focal weakness and headaches.  Psychiatric/Behavioral: Positive for altered mental status (wife reports that patient has delerium).  Negative for suicidal ideas.  All other systems reviewed and are negative.     Objective  Blood pressure (!) 176/89, pulse 74, temperature 97.7 F (36.5 C), height 5\' 6"  (1.676 m), weight 176 lb (79.8 kg), SpO2 98 %. Body mass index is 28.41 kg/m.    Physical Exam  Constitutional: He is oriented to person, place, and time. Vital signs are normal. He appears well-developed and well-nourished.  HENT:  Head: Normocephalic and atraumatic.  Neck: Normal range of motion.  Cardiovascular: Normal rate, regular rhythm, normal heart sounds and intact distal pulses.  Pulses:      Carotid pulses are on the right side with bruit and on the left side with bruit.      Femoral pulses are 2+ on the right side and 2+ on the left side.      Popliteal pulses are 2+ on the right side and 2+ on the left side.       Dorsalis pedis pulses are 0 on the right side and 0 on the left side.       Posterior tibial pulses are 1+ on the right side and 1+ on the left side.  R AV fistula/graft  Reddness noted to right lower extremity. No ischemic changes. Normal cap refill  Pulmonary/Chest: Effort normal and breath sounds normal. No accessory muscle usage. No respiratory distress.  Abdominal: Soft. Bowel sounds are normal.  Musculoskeletal: Normal range of motion.  Neurological: He is alert and oriented to person, place, and time.  Skin: Skin is warm and dry.  Vitals reviewed.  Radiology: No results found.  Laboratory examination:    CMP Latest Ref Rng & Units 01/05/2018 01/04/2018 01/03/2018  Glucose 70 - 99 mg/dL 92 60(L) 70  BUN 8 - 23 mg/dL 42(H) 74(H) 62(H)  Creatinine 0.61 - 1.24 mg/dL 7.57(H) 10.47(H) 9.68(H)  Sodium 135 - 145 mmol/L 128(L) 126(L) 129(L)  Potassium 3.5 - 5.1 mmol/L 4.7 5.5(H) 5.1  Chloride 98 - 111 mmol/L 90(L) 87(L) 88(L)  CO2 22 - 32 mmol/L 25 22 26   Calcium 8.9 - 10.3 mg/dL 8.4(L) 8.4(L) 8.6(L)  Total Protein 6.5 - 8.1 g/dL - - -  Total Bilirubin 0.3 - 1.2 mg/dL - - -    Alkaline Phos 38 -  126 U/L - - -  AST 15 - 41 U/L - - -  ALT 0 - 44 U/L - - -   CBC Latest Ref Rng & Units 01/05/2018 01/04/2018 01/03/2018  WBC 4.0 - 10.5 K/uL 4.4 6.4 5.2  Hemoglobin 13.0 - 17.0 g/dL 10.2(L) 10.3(L) 10.2(L)  Hematocrit 39.0 - 52.0 % 31.7(L) 32.4(L) 32.0(L)  Platelets 150 - 400 K/uL 115(L) 148(L) 137(L)   Lipid Panel     Component Value Date/Time   CHOL 148 01/01/2018 1652   TRIG 113 01/01/2018 1652   HDL 32 (L) 01/01/2018 1652   CHOLHDL 4.6 01/01/2018 1652   VLDL 23 01/01/2018 1652   LDLCALC 93 01/01/2018 1652   HEMOGLOBIN A1C Lab Results  Component Value Date   HGBA1C 4.7 (L) 01/01/2018   MPG 88.19 01/01/2018   TSH No results for input(s): TSH in the last 8760 hours.  Cardiac Studies:   Echocardiogram 02/20/2018: Left ventricle cavity is normal in size. Mild concentric hypertrophy of the left ventricle. Normal global wall motion. Doppler evidence of grade I (impaired) diastolic dysfunction, normal LAP. Calculated EF 61%. Moderate (Grade II) mitral regurgitation. Mild tricuspid regurgitation. Inadequate TR jet to estimate PA systolic pressure. Normal right atrial pressure.  Lower extremity arterial duplex 02/20/2018: No hemodynamically significant stenoses are identified in the right lower extremity arterial system. Moderate velocity increase at the left mid posterior tibial artery suggests >50% stenosis. Severely abnormal waveforms of the right ankle. Moderately abnormal waveforms of the left ankle. Non-compressible ABI bilateral due to medial calcinosis.  Coronary angio 02/24/2013: Diffuse distal LAD disease, RCA stenosis not felt to be amenable for PCI, patent LIMA to LAD and SVG to OM1.  Assessment   Claudication ([place])  CAD of native heart with stable angina pectoris [facility]) CABG 1999: angiogram 2015: diffuse dist LAD and RCA stenosis not amenable for PCI, patent LIMA to LAD,SVG to OM1.   S/P CABG (coronary artery bypass graft) CAD of native  heart with stable angina pectoris [facility]) CABG 1999: angio 2015: diffuse dist LAD and RCA stenosis not amenable for PCI, patent LIMA to LAD,SVG   Chronic diastolic (congestive) heart failure (HCC)  AICD (automatic cardioverter/defibrillator) present St Jude [id number] Ellipse VR single chamber ICD placed 03/24/2013 for VF arrest.  Essential hypertension - Plan: metoprolol tartrate (LOPRESSOR) 25 MG tablet  EKG 01/02/2018: Normal sinus rhythm at rate of 75 bpm, left anterior fascicular block, poor R-wave progression, cannot exclude anteroseptal infarct old.  LVH with repolarization abnormality and high lateral leads.  Normal QT interval.  Recommendations:   Patient presents here for follow-up of coronary artery disease and peripheral arterial disease.  Review of the lower extremity duplex reveals diffuse disease involving the small vessels.  Given his complex medical history, as long as he does not have critical limb ischemia, I would prefer to continue medical therapy. Since being on Neurontin, symptoms of leg pain has also improved suggesting peripheral neuropathy contributing.  He has AICD in place, needs interogation and follow-up of hypertension and will set it up in 4 weeks. I added Metoprolol for hypertension and CAD, high-dose was not described as he occasionally has hypotension during dialysis.   With regard to hypertension and chronic diastolic heart failure, fortunately he has not had acute decompensation.   He is on aspirin and statin therapy for PAD and CAD.  Advised patient and his wife to contact me for any ulcerations or skin color changes. Since last office visit he has not had any chest pain.  Patient's  wife states that he has had stress test in [place] about a year to 2 years ago, as he is asymptomatic I did not order another test.  Adrian Prows, MD, [facility] 05/29/2018, 1:28 PM [facility] [place] Pager: [phone] Office: [phone] If no answer Cell [phone]

## 2018-05-30 ENCOUNTER — Encounter: Payer: Self-pay | Admitting: Cardiology

## 2018-06-19 DIAGNOSIS — I4901 Ventricular fibrillation: Secondary | ICD-10-CM

## 2018-06-19 DIAGNOSIS — Z4502 Encounter for adjustment and management of automatic implantable cardiac defibrillator: Secondary | ICD-10-CM

## 2018-06-19 DIAGNOSIS — Z95810 Presence of automatic (implantable) cardiac defibrillator: Secondary | ICD-10-CM

## 2018-06-28 ENCOUNTER — Ambulatory Visit: Payer: Medicare Other | Admitting: Cardiology

## 2018-06-28 NOTE — Progress Notes (Deleted)
Primary Physician/Referring:  Patient, No Pcp Per  Patient ID: Knowledge Escandon, male    DOB: 1956-01-02, 63 y.o.   MRN: [id number]  No chief complaint on file.   HPI: Jaydon Avina  is a 63 y.o. male  with CAD with CABG in 1999, Diffuse disease in distal LAD and RCA, patent LIMA to LAD and SVG to OM1 by angiogram in 2015, H/O cardiac arrest S/P AICD implantation with [employer] defibrillator in 2015 in [place], uncontrolled type 2 diabetes, chronic Hepatitis C and hepatic cirrhosis, hypertension, uncontrolled type 2 diabetes, diastolic heart failure with preserved LVEF by echo in 2016 and moderate pulmonary hypertension, ESRD on HD. Patient was previously followed by [place] in 2016, but moved to [facility] and has just recently moved back to [place] in Oct 2019.     Continues to tolerate dialysis well, with occasionally having to be given IV fluids for hypotension.  He was followed by Cardiology in [place], but their report no recent testing (although wife states stress testing 1 to 2 years ago). Patient had complicated course during recent AV fistula revision in December 2019 and had a prolonged course.  Also had episodes of chest discomfort and hypotension.  He is here for 4 week follow up for hypertension. He was started on Metoprolol at his last office visit both for hypertension and CAD. He did have abnormal LE arterial duplex in Jan 2020 suggesting PAD. Bilateral leg pain improved with gabapentin.   He has not noted a significant ischemia or leg edema, chest pain or palpitation.  States that he is presently doing well.  Past Medical History:  Diagnosis Date  . AICD (automatic cardioverter/defibrillator) present   . Cardiac arrest due to underlying cardiac condition ([place])   . CHF (congestive heart failure) ([place])   . CKD (chronic kidney disease), stage III ([place])   . Complication of anesthesia    got too much anesthesia and bp dropped very low Feb, 2019  . Edema   . ESRD on dialysis ([place])    . History of blood transfusion 1950's   "related to pinal menigitis; HAD 16 OPERATIONS TOTAL"  . Hyperlipemia   . Hypertension   . Ileus ([place]) 05/2015  . Myocardial infarction ([place]) 03/2013  . Obesity   . Pneumonia 2016  . Scabies infestation 06/29/2014  . Shortness of breath dyspnea   . Type II diabetes mellitus ([place])    type 2  . Ventricular tachycardia [facility])     Past Surgical History:  Procedure Laterality Date  . AV FISTULA PLACEMENT Right 03/18/2015   Procedure: BRACHIOCEPHALIC ARTERIOVENOUS (AV) FISTULA CREATION;  Surgeon: Angelia Mould, MD;  Location: [facility];  Service: Vascular;  Laterality: Right;  . BACK SURGERY  1950's   "for spinal menigitis; HAD 16 OPERATIONS TOTAL"  . CARDIAC CATHETERIZATION    . CHOLECYSTECTOMY N/A 06/17/2015   Procedure: LAPAROSCOPIC CHOLECYSTECTOMY;  Surgeon: Coralie Keens, MD;  Location: [facility];  Service: General;  Laterality: N/A;  . CORONARY ARTERY BYPASS GRAFT  1999   cabg x4  . 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 HEAD SURGERY  1950'S   "for spinal menigitis; HAD 16 OPERATIONS TOTAL"  . IMPLANTABLE CARDIOVERTER DEFIBRILLATOR IMPLANT  03/24/2013   STJ single chamber ICD implanted by Dr Caryl Comes for secondary prevention  . IMPLANTABLE CARDIOVERTER DEFIBRILLATOR IMPLANT N/A 03/24/2013   Procedure: IMPLANTABLE CARDIOVERTER DEFIBRILLATOR IMPLANT;  Surgeon: Deboraha Sprang, MD;  Location: [facility] CATH LAB;  Service:  Cardiovascular;  Laterality: N/A;  . INSERTION OF DIALYSIS CATHETER N/A 03/13/2015   Procedure: INSERTION OF TUNNELED DIALYSIS CATHETER;  Surgeon: Conrad Luis Lopez, MD;  Location: [facility];  Service: Vascular;  Laterality: N/A;  . LEFT HEART CATHETERIZATION WITH CORONARY/GRAFT ANGIOGRAM  03/07/2013   Procedure: LEFT HEART CATHETERIZATION WITH CORONARY/GRAFT ANGIOGRAM;  Surgeon: Clent Demark, MD;  Location: [facility] CATH LAB;  Service: Cardiovascular;;  . REVISON OF ARTERIOVENOUS FISTULA Right 01/03/2018   Procedure:  REVISION OF ARTERIOVENOUS FISTULA;  Surgeon: Waynetta Sandy, MD;  Location: [facility];  Service: Vascular;  Laterality: Right;  . UMBILICAL HERNIA REPAIR N/A 06/24/2015   Procedure: HERNIA REPAIR UMBILICAL ADULT;  Surgeon: Georganna Skeans, MD;  Location: [facility];  Service: General;  Laterality: N/A;    Social History   Socioeconomic History  . Marital status: Married    Spouse name: Not on file  . Number of children: 1  . Years of education: Not on file  . Highest education level: Not on file  Occupational History  . Occupation: N/A  Social Needs  . Financial resource strain: Not on file  . Food insecurity:    Worry: Not on file    Inability: Not on file  . Transportation needs:    Medical: Not on file    Non-medical: Not on file  Tobacco Use  . Smoking status: Never Smoker  . Smokeless tobacco: Never Used  Substance and Sexual Activity  . Alcohol use: No    Alcohol/week: 0.0 standard drinks  . Drug use: No  . Sexual activity: Not Currently  Lifestyle  . Physical activity:    Days per week: Not on file    Minutes per session: Not on file  . Stress: Not on file  Relationships  . Social connections:    Talks on phone: Not on file    Gets together: Not on file    Attends religious service: Not on file    Active member of club or organization: Not on file    Attends meetings of clubs or organizations: Not on file    Relationship status: Not on file  . Intimate partner violence:    Fear of current or ex partner: Not on file    Emotionally abused: Not on file    Physically abused: Not on file    Forced sexual activity: Not on file  Other Topics Concern  . Not on file  Social History Narrative   Lives at home w/ his wife and daughter   Right-handed   Caffeine: none    Current Outpatient Medications on File Prior to Visit  Medication Sig Dispense Refill  . acetaminophen (TYLENOL) 325 MG tablet Take 2 tablets (650 mg total) by mouth every 6 (six) hours as needed  for mild pain or moderate pain. 120 tablet 0  . amLODipine (NORVASC) 10 MG tablet Take 10 mg by mouth daily.    Marland Kitchen aspirin EC 81 MG tablet Take 81 mg by mouth daily.     . calcium acetate (PHOSLO) 667 MG capsule Take by mouth 3 (three) times daily with meals.    . cinacalcet (SENSIPAR) 30 MG tablet Take 30 mg by mouth daily with supper.    . clopidogrel (PLAVIX) 75 MG tablet Take 75 mg by mouth daily.    Marland Kitchen gabapentin (NEURONTIN) 300 MG capsule Take 1 capsule (300 mg total) by mouth 2 (two) times daily. 60 capsule 2  . hydrALAZINE (APRESOLINE) 10 MG tablet Take 10 mg by mouth  daily as needed (anxiety).    . isosorbide mononitrate (IMDUR) 60 MG 24 hr tablet Take 60 mg by mouth as needed.     . metoprolol tartrate (LOPRESSOR) 25 MG tablet Take 1 tablet (25 mg total) by mouth 2 (two) times daily. 180 tablet 3  . multivitamin (RENA-VIT) TABS tablet Take 1 tablet by mouth at bedtime. 30 tablet 0  . pantoprazole (PROTONIX) 40 MG tablet Take 40 mg by mouth continuous as needed.     . rosuvastatin (CRESTOR) 20 MG tablet Take 1 tablet (20 mg total) by mouth daily at 6 PM. 30 tablet 0   No current facility-administered medications on file prior to visit.    Review of Systems  Constitution: Negative for decreased appetite, malaise/fatigue, weight gain and weight loss.  Eyes: Negative for visual disturbance.  Cardiovascular: Negative for dyspnea on exertion, leg swelling, orthopnea, palpitations and syncope.  Respiratory: Negative for hemoptysis and wheezing.   Endocrine: Negative for cold intolerance and heat intolerance.  Hematologic/Lymphatic: Does not bruise/bleed easily.  Skin: Negative for nail changes.  Musculoskeletal: Positive for muscle cramps. Negative for muscle weakness and myalgias.  Gastrointestinal: Negative for abdominal pain, change in bowel habit, nausea and vomiting.  Neurological: Positive for numbness (lower extremities). Negative for difficulty with concentration, dizziness, focal  weakness and headaches.  Psychiatric/Behavioral: Positive for altered mental status (wife reports that patient has delerium). Negative for suicidal ideas.  All other systems reviewed and are negative.     Objective  There were no vitals taken for this visit. There is no height or weight on file to calculate BMI.    Physical Exam  Constitutional: He is oriented to person, place, and time. Vital signs are normal. He appears well-developed and well-nourished.  HENT:  Head: Normocephalic and atraumatic.  Neck: Normal range of motion.  Cardiovascular: Normal rate, regular rhythm, normal heart sounds and intact distal pulses.  Pulses:      Carotid pulses are on the right side with bruit and on the left side with bruit.      Femoral pulses are 2+ on the right side and 2+ on the left side.      Popliteal pulses are 2+ on the right side and 2+ on the left side.       Dorsalis pedis pulses are 0 on the right side and 0 on the left side.       Posterior tibial pulses are 1+ on the right side and 1+ on the left side.  R AV fistula/graft  Reddness noted to right lower extremity. No ischemic changes. Normal cap refill  Pulmonary/Chest: Effort normal and breath sounds normal. No accessory muscle usage. No respiratory distress.  Abdominal: Soft. Bowel sounds are normal.  Musculoskeletal: Normal range of motion.  Neurological: He is alert and oriented to person, place, and time.  Skin: Skin is warm and dry.  Vitals reviewed.  Radiology: No results found.  Laboratory examination:    CMP Latest Ref Rng & Units 01/05/2018 01/04/2018 01/03/2018  Glucose 70 - 99 mg/dL 92 60(L) 70  BUN 8 - 23 mg/dL 42(H) 74(H) 62(H)  Creatinine 0.61 - 1.24 mg/dL 7.57(H) 10.47(H) 9.68(H)  Sodium 135 - 145 mmol/L 128(L) 126(L) 129(L)  Potassium 3.5 - 5.1 mmol/L 4.7 5.5(H) 5.1  Chloride 98 - 111 mmol/L 90(L) 87(L) 88(L)  CO2 22 - 32 mmol/L 25 22 26   Calcium 8.9 - 10.3 mg/dL 8.4(L) 8.4(L) 8.6(L)  Total Protein 6.5  - 8.1 g/dL - - -  Total  Bilirubin 0.3 - 1.2 mg/dL - - -  Alkaline Phos 38 - 126 U/L - - -  AST 15 - 41 U/L - - -  ALT 0 - 44 U/L - - -   CBC Latest Ref Rng & Units 01/05/2018 01/04/2018 01/03/2018  WBC 4.0 - 10.5 K/uL 4.4 6.4 5.2  Hemoglobin 13.0 - 17.0 g/dL 10.2(L) 10.3(L) 10.2(L)  Hematocrit 39.0 - 52.0 % 31.7(L) 32.4(L) 32.0(L)  Platelets 150 - 400 K/uL 115(L) 148(L) 137(L)   Lipid Panel     Component Value Date/Time   CHOL 148 01/01/2018 1652   TRIG 113 01/01/2018 1652   HDL 32 (L) 01/01/2018 1652   CHOLHDL 4.6 01/01/2018 1652   VLDL 23 01/01/2018 1652   LDLCALC 93 01/01/2018 1652   HEMOGLOBIN A1C Lab Results  Component Value Date   HGBA1C 4.7 (L) 01/01/2018   MPG 88.19 01/01/2018   TSH No results for input(s): TSH in the last 8760 hours.  Cardiac Studies:   Echocardiogram 02/20/2018: Left ventricle cavity is normal in size. Mild concentric hypertrophy of the left ventricle. Normal global wall motion. Doppler evidence of grade I (impaired) diastolic dysfunction, normal LAP. Calculated EF 61%. Moderate (Grade II) mitral regurgitation. Mild tricuspid regurgitation. Inadequate TR jet to estimate PA systolic pressure. Normal right atrial pressure.  Lower extremity arterial duplex 02/20/2018: No hemodynamically significant stenoses are identified in the right lower extremity arterial system. Moderate velocity increase at the left mid posterior tibial artery suggests >50% stenosis. Severely abnormal waveforms of the right ankle. Moderately abnormal waveforms of the left ankle. Non-compressible ABI bilateral due to medial calcinosis.  Coronary angio 02/24/2013: Diffuse distal LAD disease, RCA stenosis not felt to be amenable for PCI, patent LIMA to LAD and SVG to OM1.  Assessment   No diagnosis found.  EKG 01/02/2018: Normal sinus rhythm at rate of 75 bpm, left anterior fascicular block, poor R-wave progression, cannot exclude anteroseptal infarct old.  LVH with  repolarization abnormality and high lateral leads.  Normal QT interval.  Recommendations:   ***  Patient presents here for follow-up of coronary artery disease and peripheral arterial disease.  Review of the lower extremity duplex reveals diffuse disease involving the small vessels.  Given his complex medical history, as long as he does not have critical limb ischemia, I would prefer to continue medical therapy. Since being on Neurontin, symptoms of leg pain has also improved suggesting peripheral neuropathy contributing.  He has AICD in place, needs interogation and follow-up of hypertension and will set it up in 4 weeks. I added Metoprolol for hypertension and CAD, high-dose was not described as he occasionally has hypotension during dialysis.   With regard to hypertension and chronic diastolic heart failure, fortunately he has not had acute decompensation.   He is on aspirin and statin therapy for PAD and CAD.  Advised patient and his wife to contact me for any ulcerations or skin color changes. Since last office visit he has not had any chest pain.  [name] states that he has had stress test in [place] about a year to 2 years ago, as he is asymptomatic I did not order another test.  Miquel Dunn, MD, [facility] 06/28/2018, 2:44 PM [facility]. [place] Pager: [phone] Office: [phone] If no answer Cell [phone]

## 2018-07-02 ENCOUNTER — Encounter: Payer: Self-pay | Admitting: Cardiology

## 2018-07-17 ENCOUNTER — Other Ambulatory Visit: Payer: Self-pay

## 2018-07-17 ENCOUNTER — Encounter: Payer: Self-pay | Admitting: Cardiology

## 2018-07-17 ENCOUNTER — Ambulatory Visit (INDEPENDENT_AMBULATORY_CARE_PROVIDER_SITE_OTHER): Payer: Medicare Other | Admitting: Cardiology

## 2018-07-17 VITALS — BP 121/71 | HR 72 | Ht 66.0 in | Wt 183.8 lb

## 2018-07-17 DIAGNOSIS — Z95810 Presence of automatic (implantable) cardiac defibrillator: Secondary | ICD-10-CM

## 2018-07-17 DIAGNOSIS — I5032 Chronic diastolic (congestive) heart failure: Secondary | ICD-10-CM

## 2018-07-17 DIAGNOSIS — N186 End stage renal disease: Secondary | ICD-10-CM

## 2018-07-17 DIAGNOSIS — I25118 Atherosclerotic heart disease of native coronary artery with other forms of angina pectoris: Secondary | ICD-10-CM | POA: Diagnosis not present

## 2018-07-17 DIAGNOSIS — Z992 Dependence on renal dialysis: Secondary | ICD-10-CM

## 2018-07-17 MED ORDER — FUROSEMIDE 80 MG PO TABS: 80.0000 mg | ORAL_TABLET | ORAL | 3 refills | Status: DC

## 2018-07-17 NOTE — Progress Notes (Signed)
Primary Physician/Referring:  Patient, No Pcp Per  Patient ID: Eric [name], male    DOB: 1955/07/18, 63 y.o.   MRN: [id number]  Chief Complaint  Patient presents with  . Claudication  . Coronary Artery Disease  . Follow-up    4wk    HPI: Eric [name]  is a 63 y.o. male  with CAD with CABG in 1999, Diffuse disease in distal LAD and RCA, patent LIMA to LAD and SVG to OM1 by angiogram in 2015, H/O cardiac arrest S/P AICD implantation with St. Jude defibrillator in 2015 in [place], uncontrolled type 2 diabetes, chronic Hepatitis C and hepatic cirrhosis, hypertension, uncontrolled type 2 diabetes, diastolic heart failure with preserved LVEF by echo in 2016 and moderate pulmonary hypertension, ESRD on HD. Patient was last seen by [place] in 2016, but moved to [facility] and has just recently moved back to [place] in Oct 2019. I saw him 6 weeks ago, now presents for follow-up of CHF and also to discuss regarding pacemaker transmission.  Since last office visit he has noticed slight gaining weight, abdominal distention. Continues to tolerate dialysis well, with occasionally having to be given IV fluids for hypotension.  He is presently on Pletal with improvement in symptoms claudication bilaterally. No angina, no dyspnea, PND or orthopnea  Past Medical History:  Diagnosis Date  . AICD (automatic cardioverter/defibrillator) present   . Cardiac arrest ([place]) 03/07/2013  . Cardiac arrest due to underlying cardiac condition ([place])   . CHF (congestive heart failure) ([place])   . CKD (chronic kidney disease), stage III ([place])   . Complication of anesthesia    got too much anesthesia and bp dropped very low Feb, 2019  . Edema   . ESRD on dialysis ([place])   . History of blood transfusion 1950's   "related to pinal menigitis; HAD 16 OPERATIONS TOTAL"  . Hyperlipemia   . Hypertension   . Ileus ([place]) 05/2015  . Myocardial infarction ([place]) 03/2013  . Pain in the chest   . Pneumonia 2016  . Rash and  nonspecific skin eruption 06/29/2014  . Scabies infestation 06/29/2014  . Shortness of breath dyspnea   . Type II diabetes mellitus ([place])    type 2  . Ventricular tachycardia [facility])     Past Surgical History:  Procedure Laterality Date  . AV FISTULA PLACEMENT Right 03/18/2015   Procedure: BRACHIOCEPHALIC ARTERIOVENOUS (AV) FISTULA CREATION;  Surgeon: Angelia Mould, MD;  Location: [place];  Service: Vascular;  Laterality: Right;  . BACK SURGERY  1950's   "for spinal menigitis; HAD 16 OPERATIONS TOTAL"  . CARDIAC CATHETERIZATION    . CHOLECYSTECTOMY N/A 06/17/2015   Procedure: LAPAROSCOPIC CHOLECYSTECTOMY;  Surgeon: Coralie Keens, MD;  Location: [place];  Service: General;  Laterality: N/A;  . CORONARY ARTERY BYPASS GRAFT  1999   cabg x4  . 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 HEAD SURGERY  1950'S   "for spinal menigitis; HAD 16 OPERATIONS TOTAL"  . IMPLANTABLE CARDIOVERTER DEFIBRILLATOR IMPLANT  03/24/2013   STJ single chamber ICD implanted by Dr Caryl Comes for secondary prevention  . IMPLANTABLE CARDIOVERTER DEFIBRILLATOR IMPLANT N/A 03/24/2013   Procedure: IMPLANTABLE CARDIOVERTER DEFIBRILLATOR IMPLANT;  Surgeon: Deboraha Sprang, MD;  Location: [facility] CATH LAB;  Service: Cardiovascular;  Laterality: N/A;  . INSERTION OF DIALYSIS CATHETER N/A 03/13/2015   Procedure: INSERTION OF TUNNELED DIALYSIS CATHETER;  Surgeon: Conrad Algood, MD;  Location: [place];  Service: Vascular;  Laterality: N/A;  .  LEFT HEART CATHETERIZATION WITH CORONARY/GRAFT ANGIOGRAM  03/07/2013   Procedure: LEFT HEART CATHETERIZATION WITH [name];  Surgeon: Clent Demark, MD;  Location: [facility] CATH LAB;  Service: Cardiovascular;;  . REVISON OF ARTERIOVENOUS FISTULA Right 01/03/2018   Procedure: REVISION OF ARTERIOVENOUS FISTULA;  Surgeon: Waynetta Sandy, MD;  Location: [place];  Service: Vascular;  Laterality: Right;  . UMBILICAL HERNIA REPAIR N/A 06/24/2015   Procedure:  HERNIA REPAIR UMBILICAL ADULT;  Surgeon: Georganna Skeans, MD;  Location: [place];  Service: General;  Laterality: N/A;    Social History   Socioeconomic History  . Marital status: Married    Spouse name: Not on file  . Number of children: 1  . Years of education: Not on file  . Highest education level: Not on file  Occupational History  . Occupation: N/A  Social Needs  . Financial resource strain: Not on file  . Food insecurity    Worry: Not on file    Inability: Not on file  . Transportation needs    Medical: Not on file    Non-medical: Not on file  Tobacco Use  . Smoking status: Former Smoker    Packs/day: 1.00    Years: 5.00    Pack years: 5.00    Types: Cigarettes  . Smokeless tobacco: Never Used  Substance and Sexual Activity  . Alcohol use: No    Alcohol/week: 0.0 standard drinks  . Drug use: No  . Sexual activity: Not Currently  Lifestyle  . Physical activity    Days per week: Not on file    Minutes per session: Not on file  . Stress: Not on file  Relationships  . Social Herbalist on phone: Not on file    Gets together: Not on file    Attends religious service: Not on file    Active member of club or organization: Not on file    Attends meetings of clubs or organizations: Not on file    Relationship status: Not on file  . Intimate partner violence    Fear of current or ex partner: Not on file    Emotionally abused: Not on file    Physically abused: Not on file    Forced sexual activity: Not on file  Other Topics Concern  . Not on file  Social History Narrative   Lives at home w/ his wife and daughter   Right-handed   Caffeine: none    Current Outpatient Medications on File Prior to Visit  Medication Sig Dispense Refill  . acetaminophen (TYLENOL) 325 MG tablet Take 2 tablets (650 mg total) by mouth every 6 (six) hours as needed for mild pain or moderate pain. 120 tablet 0  . amLODipine (NORVASC) 10 MG tablet Take 10 mg by mouth daily.    Marland Kitchen  aspirin EC 81 MG tablet Take 81 mg by mouth daily.     . calcium acetate (PHOSLO) 667 MG capsule Take by mouth 3 (three) times daily with meals.    . cinacalcet (SENSIPAR) 30 MG tablet Take 30 mg by mouth daily with supper.    . gabapentin (NEURONTIN) 300 MG capsule Take 1 capsule (300 mg total) by mouth 2 (two) times daily. 60 capsule 2  . hydrALAZINE (APRESOLINE) 10 MG tablet Take 10 mg by mouth daily as needed (anxiety).    . isosorbide mononitrate (IMDUR) 60 MG 24 hr tablet Take 60 mg by mouth as needed.     . metoprolol tartrate (LOPRESSOR) 25  MG tablet Take 1 tablet (25 mg total) by mouth 2 (two) times daily. 180 tablet 3  . multivitamin (RENA-VIT) TABS tablet Take 1 tablet by mouth at bedtime. 30 tablet 0  . pantoprazole (PROTONIX) 40 MG tablet Take 40 mg by mouth continuous as needed.     . rosuvastatin (CRESTOR) 20 MG tablet Take 1 tablet (20 mg total) by mouth daily at 6 PM. 30 tablet 0   No current facility-administered medications on file prior to visit.    Review of Systems  Constitution: Negative for decreased appetite, malaise/fatigue, weight gain and weight loss.  Eyes: Negative for visual disturbance.  Cardiovascular: Negative for dyspnea on exertion, leg swelling, orthopnea, palpitations and syncope.  Respiratory: Negative for hemoptysis and wheezing.   Endocrine: Negative for cold intolerance and heat intolerance.  Hematologic/Lymphatic: Does not bruise/bleed easily.  Skin: Negative for nail changes.  Musculoskeletal: Positive for muscle cramps. Negative for muscle weakness and myalgias.  Gastrointestinal: Negative for abdominal pain, change in bowel habit, nausea and vomiting.  Neurological: Positive for numbness (lower extremities). Negative for difficulty with concentration, dizziness, focal weakness and headaches.  Psychiatric/Behavioral: Positive for altered mental status (wife reports that patient has delerium). Negative for suicidal ideas.  All other systems  reviewed and are negative.     Objective  Blood pressure 121/71, pulse 72, height 5\' 6"  (1.676 m), weight 183 lb 12.8 oz (83.4 kg), SpO2 93 %. Body mass index is 29.67 kg/m.    Physical Exam  Constitutional: He is oriented to person, place, and time. Vital signs are normal. He appears well-developed and well-nourished.  HENT:  Head: Normocephalic and atraumatic.  Neck: Normal range of motion.  Cardiovascular: Normal rate, regular rhythm, normal heart sounds and intact distal pulses.  Pulses:      Carotid pulses are on the right side with bruit and on the left side with bruit.      Femoral pulses are 2+ on the right side and 2+ on the left side.      Popliteal pulses are 2+ on the right side and 2+ on the left side.       Dorsalis pedis pulses are 0 on the right side and 0 on the left side.       Posterior tibial pulses are 1+ on the right side and 1+ on the left side.  R AV fistula/graft  Reddness noted to right lower extremity. No ischemic changes. Normal cap refill  Pulmonary/Chest: Effort normal and breath sounds normal. No accessory muscle usage. No respiratory distress.  Abdominal: Soft. Bowel sounds are normal.  Musculoskeletal: Normal range of motion.  Neurological: He is alert and oriented to person, place, and time.  Skin: Skin is warm and dry.  Vitals reviewed.  Radiology: No results found.  Laboratory examination:    CMP Latest Ref Rng & Units 01/05/2018 01/04/2018 01/03/2018  Glucose 70 - 99 mg/dL 92 60(L) 70  BUN 8 - 23 mg/dL 42(H) 74(H) 62(H)  Creatinine 0.61 - 1.24 mg/dL 7.57(H) 10.47(H) 9.68(H)  Sodium 135 - 145 mmol/L 128(L) 126(L) 129(L)  Potassium 3.5 - 5.1 mmol/L 4.7 5.5(H) 5.1  Chloride 98 - 111 mmol/L 90(L) 87(L) 88(L)  CO2 22 - 32 mmol/L 25 22 26   Calcium 8.9 - 10.3 mg/dL 8.4(L) 8.4(L) 8.6(L)  Total Protein 6.5 - 8.1 g/dL - - -  Total Bilirubin 0.3 - 1.2 mg/dL - - -  Alkaline Phos 38 - 126 U/L - - -  AST 15 - 41 U/L - - -  ALT 0 - 44 U/L - - -    CBC Latest Ref Rng & Units 01/05/2018 01/04/2018 01/03/2018  WBC 4.0 - 10.5 K/uL 4.4 6.4 5.2  Hemoglobin 13.0 - 17.0 g/dL 10.2(L) 10.3(L) 10.2(L)  Hematocrit 39.0 - 52.0 % 31.7(L) 32.4(L) 32.0(L)  Platelets 150 - 400 K/uL 115(L) 148(L) 137(L)   Lipid Panel     Component Value Date/Time   CHOL 148 01/01/2018 1652   TRIG 113 01/01/2018 1652   HDL 32 (L) 01/01/2018 1652   CHOLHDL 4.6 01/01/2018 1652   VLDL 23 01/01/2018 1652   LDLCALC 93 01/01/2018 1652   HEMOGLOBIN A1C Lab Results  Component Value Date   HGBA1C 4.7 (L) 01/01/2018   MPG 88.19 01/01/2018   TSH No results for input(s): TSH in the last 8760 hours.  Cardiac Studies:   Echocardiogram 02/20/2018: Left ventricle cavity is normal in size. Mild concentric hypertrophy of the left ventricle. Normal global wall motion. Doppler evidence of grade I (impaired) diastolic dysfunction, normal LAP. Calculated EF 61%. Moderate (Grade II) mitral regurgitation. Mild tricuspid regurgitation. Inadequate TR jet to estimate PA systolic pressure. Normal right atrial pressure.  Lower extremity arterial duplex 02/20/2018: No hemodynamically significant stenoses are identified in the right lower extremity arterial system. Moderate velocity increase at the left mid posterior tibial artery suggests >50% stenosis. Severely abnormal waveforms of the right ankle. Moderately abnormal waveforms of the left ankle. Non-compressible ABI bilateral due to medial calcinosis.  Coronary angio 02/24/2013: Diffuse distal LAD disease, RCA stenosis not felt to be amenable for PCI, patent LIMA to LAD and SVG to OM1.  Assessment   CAD of native heart with stable angina pectoris [facility]) CABG 1999: angiogram 2015: diffuse dist LAD and RCA stenosis not amenable for PCI, patent LIMA to LAD,SVG to OM1.  - Plan: CANCELED: EKG 69-SWNI  Chronic diastolic (congestive) heart failure (HCC) - Plan: furosemide (LASIX) 80 MG tablet  AICD (automatic  cardioverter/defibrillator) present St Jude [id number] Ellipse VR single chamber ICD placed 03/24/2013 for VF arrest.   ESRD (end stage renal disease) on dialysis ([place]) - Plan: furosemide (LASIX) 80 MG tablet  EKG 01/02/2018: Normal sinus rhythm at rate of 75 bpm, left anterior fascicular block, poor R-wave progression, cannot exclude anteroseptal infarct old.  LVH with repolarization abnormality and high lateral leads.  Normal QT interval.  Remote check 5.27.20: VVI ICD ICM.  recent 11 days of +fluid/resolved. Increased VP %  Battery longevity is 4.2 years. RV pacing is 79 %.  Recommendations:   Patient presents here for a 6 week follow-up visit of CAD, PAD and CHF.  I was able to interrogate his ICD remotely, pressure resolving CHF, but however in the last 2 weeks patient has been slightly gaining weight and also I do hear bilateral basal crackles in his lungs.  I discussed with him again regarding CHF and avoidance of salty food and excessive fluid, his wife is present and she follows him very closely, she does state that patient wishes to eat salty food and also plenty of fluids.  Heart failure discussion was held in detail.  He has anuretic renal failure, I'll start him on 80 mg of furosemide daily.  He will hold it on the days of dialysis. ICD evaluation reveals normal function.  No recurrence of angina pectoris, no symptoms of claudication or limb threatening ischemia.  I'll see him back again in 3 months.  Adrian Prows, MD, [facility] 07/17/2018, 12:47 PM [facility] [place] Pager: [phone] Office: [phone] If no answer  Cell [phone]

## 2018-07-30 ENCOUNTER — Other Ambulatory Visit: Payer: Self-pay

## 2018-07-30 ENCOUNTER — Encounter (HOSPITAL_COMMUNITY): Payer: Self-pay | Admitting: Student

## 2018-07-30 ENCOUNTER — Emergency Department (HOSPITAL_COMMUNITY): Payer: Medicare Other

## 2018-07-30 ENCOUNTER — Observation Stay (HOSPITAL_COMMUNITY)
Admission: EM | Admit: 2018-07-30 | Discharge: 2018-07-31 | Disposition: A | Discharge status: Home / Self Care | Payer: Medicare Other | Attending: Internal Medicine | Admitting: Internal Medicine

## 2018-07-30 DIAGNOSIS — D472 Monoclonal gammopathy: Secondary | ICD-10-CM

## 2018-07-30 DIAGNOSIS — I252 Old myocardial infarction: Secondary | ICD-10-CM | POA: Diagnosis not present

## 2018-07-30 DIAGNOSIS — E669 Obesity, unspecified: Secondary | ICD-10-CM | POA: Insufficient documentation

## 2018-07-30 DIAGNOSIS — R0789 Other chest pain: Principal | ICD-10-CM | POA: Insufficient documentation

## 2018-07-30 DIAGNOSIS — I2510 Atherosclerotic heart disease of native coronary artery without angina pectoris: Secondary | ICD-10-CM | POA: Diagnosis not present

## 2018-07-30 DIAGNOSIS — Z888 Allergy status to other drugs, medicaments and biological substances status: Secondary | ICD-10-CM

## 2018-07-30 DIAGNOSIS — I509 Heart failure, unspecified: Secondary | ICD-10-CM | POA: Diagnosis not present

## 2018-07-30 DIAGNOSIS — Z95810 Presence of automatic (implantable) cardiac defibrillator: Secondary | ICD-10-CM

## 2018-07-30 DIAGNOSIS — E785 Hyperlipidemia, unspecified: Secondary | ICD-10-CM | POA: Diagnosis not present

## 2018-07-30 DIAGNOSIS — Z79899 Other long term (current) drug therapy: Secondary | ICD-10-CM | POA: Diagnosis not present

## 2018-07-30 DIAGNOSIS — Z7982 Long term (current) use of aspirin: Secondary | ICD-10-CM | POA: Insufficient documentation

## 2018-07-30 DIAGNOSIS — Z87891 Personal history of nicotine dependence: Secondary | ICD-10-CM | POA: Diagnosis not present

## 2018-07-30 DIAGNOSIS — B1920 Unspecified viral hepatitis C without hepatic coma: Secondary | ICD-10-CM

## 2018-07-30 DIAGNOSIS — R1013 Epigastric pain: Secondary | ICD-10-CM

## 2018-07-30 DIAGNOSIS — R2681 Unsteadiness on feet: Secondary | ICD-10-CM | POA: Insufficient documentation

## 2018-07-30 DIAGNOSIS — E1122 Type 2 diabetes mellitus with diabetic chronic kidney disease: Secondary | ICD-10-CM | POA: Diagnosis not present

## 2018-07-30 DIAGNOSIS — R011 Cardiac murmur, unspecified: Secondary | ICD-10-CM

## 2018-07-30 DIAGNOSIS — I255 Ischemic cardiomyopathy: Secondary | ICD-10-CM | POA: Insufficient documentation

## 2018-07-30 DIAGNOSIS — N2581 Secondary hyperparathyroidism of renal origin: Secondary | ICD-10-CM

## 2018-07-30 DIAGNOSIS — E875 Hyperkalemia: Secondary | ICD-10-CM

## 2018-07-30 DIAGNOSIS — Z992 Dependence on renal dialysis: Secondary | ICD-10-CM | POA: Diagnosis not present

## 2018-07-30 DIAGNOSIS — Z951 Presence of aortocoronary bypass graft: Secondary | ICD-10-CM | POA: Insufficient documentation

## 2018-07-30 DIAGNOSIS — I132 Hypertensive heart and chronic kidney disease with heart failure and with stage 5 chronic kidney disease, or end stage renal disease: Secondary | ICD-10-CM | POA: Insufficient documentation

## 2018-07-30 DIAGNOSIS — N186 End stage renal disease: Secondary | ICD-10-CM | POA: Diagnosis present

## 2018-07-30 DIAGNOSIS — Z6829 Body mass index (BMI) 29.0-29.9, adult: Secondary | ICD-10-CM | POA: Insufficient documentation

## 2018-07-30 DIAGNOSIS — Z1159 Encounter for screening for other viral diseases: Secondary | ICD-10-CM | POA: Insufficient documentation

## 2018-07-30 DIAGNOSIS — R7989 Other specified abnormal findings of blood chemistry: Secondary | ICD-10-CM

## 2018-07-30 DIAGNOSIS — R109 Unspecified abdominal pain: Secondary | ICD-10-CM | POA: Insufficient documentation

## 2018-07-30 DIAGNOSIS — K7460 Unspecified cirrhosis of liver: Secondary | ICD-10-CM | POA: Diagnosis not present

## 2018-07-30 DIAGNOSIS — R1011 Right upper quadrant pain: Secondary | ICD-10-CM

## 2018-07-30 DIAGNOSIS — Z9049 Acquired absence of other specified parts of digestive tract: Secondary | ICD-10-CM | POA: Insufficient documentation

## 2018-07-30 DIAGNOSIS — D631 Anemia in chronic kidney disease: Secondary | ICD-10-CM

## 2018-07-30 DIAGNOSIS — Z8674 Personal history of sudden cardiac arrest: Secondary | ICD-10-CM | POA: Insufficient documentation

## 2018-07-30 DIAGNOSIS — N400 Enlarged prostate without lower urinary tract symptoms: Secondary | ICD-10-CM | POA: Diagnosis not present

## 2018-07-30 DIAGNOSIS — G8929 Other chronic pain: Secondary | ICD-10-CM

## 2018-07-30 DIAGNOSIS — R079 Chest pain, unspecified: Secondary | ICD-10-CM | POA: Diagnosis present

## 2018-07-30 DIAGNOSIS — Z905 Acquired absence of kidney: Secondary | ICD-10-CM

## 2018-07-30 DIAGNOSIS — I5020 Unspecified systolic (congestive) heart failure: Secondary | ICD-10-CM

## 2018-07-30 DIAGNOSIS — E1140 Type 2 diabetes mellitus with diabetic neuropathy, unspecified: Secondary | ICD-10-CM | POA: Diagnosis not present

## 2018-07-30 DIAGNOSIS — I739 Peripheral vascular disease, unspecified: Secondary | ICD-10-CM

## 2018-07-30 LAB — CBC WITH DIFFERENTIAL/PLATELET
Abs Immature Granulocytes: 0.03 10*3/uL (ref 0.00–0.07)
Basophils Absolute: 0 10*3/uL (ref 0.0–0.1)
Basophils Relative: 0 %
Eosinophils Absolute: 0.1 10*3/uL (ref 0.0–0.5)
Eosinophils Relative: 2 %
HCT: 31.8 % — ABNORMAL LOW (ref 39.0–52.0)
Hemoglobin: 9.5 g/dL — ABNORMAL LOW (ref 13.0–17.0)
Immature Granulocytes: 1 %
Lymphocytes Relative: 15 %
Lymphs Abs: 0.7 10*3/uL (ref 0.7–4.0)
MCH: 32.2 pg (ref 26.0–34.0)
MCHC: 29.9 g/dL — ABNORMAL LOW (ref 30.0–36.0)
MCV: 107.8 fL — ABNORMAL HIGH (ref 80.0–100.0)
Monocytes Absolute: 0.4 10*3/uL (ref 0.1–1.0)
Monocytes Relative: 8 %
Neutro Abs: 3.4 10*3/uL (ref 1.7–7.7)
Neutrophils Relative %: 74 %
Platelets: 53 10*3/uL — ABNORMAL LOW (ref 150–400)
RBC: 2.95 MIL/uL — ABNORMAL LOW (ref 4.22–5.81)
RDW: 16.7 % — ABNORMAL HIGH (ref 11.5–15.5)
WBC: 4.6 10*3/uL (ref 4.0–10.5)
nRBC: 0 % (ref 0.0–0.2)

## 2018-07-30 LAB — COMPREHENSIVE METABOLIC PANEL
ALT: 18 U/L (ref 0–44)
AST: 15 U/L (ref 15–41)
Albumin: 3.6 g/dL (ref 3.5–5.0)
Alkaline Phosphatase: 56 U/L (ref 38–126)
Anion gap: 15 (ref 5–15)
BUN: 55 mg/dL — ABNORMAL HIGH (ref 8–23)
CO2: 26 mmol/L (ref 22–32)
Calcium: 9.4 mg/dL (ref 8.9–10.3)
Chloride: 94 mmol/L — ABNORMAL LOW (ref 98–111)
Creatinine, Ser: 9.64 mg/dL — ABNORMAL HIGH (ref 0.61–1.24)
GFR calc Af Amer: 6 mL/min — ABNORMAL LOW (ref >60)
GFR calc non Af Amer: 5 mL/min — ABNORMAL LOW (ref >60)
Glucose, Bld: 140 mg/dL — ABNORMAL HIGH (ref 70–99)
Potassium: 5.2 mmol/L — ABNORMAL HIGH (ref 3.5–5.1)
Sodium: 135 mmol/L (ref 135–145)
Total Bilirubin: 0.8 mg/dL (ref 0.3–1.2)
Total Protein: 8.5 g/dL — ABNORMAL HIGH (ref 6.5–8.1)

## 2018-07-30 LAB — CBG MONITORING, ED
Glucose-Capillary: 140 mg/dL — ABNORMAL HIGH (ref 70–99)
Glucose-Capillary: 73 mg/dL (ref 70–99)

## 2018-07-30 LAB — TROPONIN I (HIGH SENSITIVITY)
Troponin I (High Sensitivity): 28 ng/L — ABNORMAL HIGH (ref <18)
Troponin I (High Sensitivity): 24 ng/L — ABNORMAL HIGH (ref <18)
Troponin I (High Sensitivity): 25 ng/L — ABNORMAL HIGH (ref <18)
Troponin I (High Sensitivity): 27 ng/L — ABNORMAL HIGH (ref <18)

## 2018-07-30 LAB — BRAIN NATRIURETIC PEPTIDE: B Natriuretic Peptide: 2572.9 pg/mL — ABNORMAL HIGH (ref 0.0–100.0)

## 2018-07-30 LAB — MRSA PCR SCREENING: MRSA by PCR: NEGATIVE

## 2018-07-30 LAB — SARS CORONAVIRUS 2 BY RT PCR (HOSPITAL ORDER, PERFORMED IN ~~LOC~~ HOSPITAL LAB): SARS Coronavirus 2: NEGATIVE

## 2018-07-30 LAB — LIPASE, BLOOD: Lipase: 32 U/L (ref 11–51)

## 2018-07-30 MED ORDER — FUROSEMIDE 80 MG PO TABS
80.0000 mg | ORAL_TABLET | ORAL | Status: DC
  Administered 2018-07-31: 80 mg via ORAL
  Filled 2018-07-30 (×2): qty 1

## 2018-07-30 MED ORDER — ACETAMINOPHEN 325 MG PO TABS
ORAL_TABLET | ORAL | Status: AC
  Filled 2018-07-30: qty 2

## 2018-07-30 MED ORDER — RENA-VITE PO TABS
1.0000 | ORAL_TABLET | Freq: Every day | ORAL | Status: DC
  Administered 2018-07-30: 1 via ORAL
  Filled 2018-07-30 (×2): qty 1

## 2018-07-30 MED ORDER — ROSUVASTATIN CALCIUM 20 MG PO TABS
20.0000 mg | ORAL_TABLET | Freq: Every day | ORAL | Status: DC
  Administered 2018-07-30: 20 mg via ORAL
  Filled 2018-07-30: qty 1

## 2018-07-30 MED ORDER — PANTOPRAZOLE SODIUM 40 MG PO TBEC
40.0000 mg | DELAYED_RELEASE_TABLET | ORAL | Status: DC | PRN
  Administered 2018-07-30: 40 mg via ORAL
  Filled 2018-07-30: qty 1

## 2018-07-30 MED ORDER — HEPARIN SODIUM (PORCINE) 5000 UNIT/ML IJ SOLN
5000.0000 [IU] | Freq: Three times a day (TID) | INTRAMUSCULAR | Status: DC
  Administered 2018-07-30 – 2018-07-31 (×3): 5000 [IU] via SUBCUTANEOUS
  Filled 2018-07-30 (×4): qty 1

## 2018-07-30 MED ORDER — CALCIUM ACETATE (PHOS BINDER) 667 MG PO CAPS
667.0000 mg | ORAL_CAPSULE | Freq: Three times a day (TID) | ORAL | Status: DC
  Administered 2018-07-30 – 2018-07-31 (×2): 667 mg via ORAL
  Filled 2018-07-30 (×3): qty 1

## 2018-07-30 MED ORDER — HYOSCYAMINE SULFATE 0.125 MG SL SUBL
0.2500 mg | SUBLINGUAL_TABLET | Freq: Once | SUBLINGUAL | Status: AC
  Administered 2018-07-30: 0.25 mg via SUBLINGUAL
  Filled 2018-07-30: qty 2

## 2018-07-30 MED ORDER — FENTANYL CITRATE (PF) 100 MCG/2ML IJ SOLN
25.0000 ug | Freq: Once | INTRAMUSCULAR | Status: AC
  Administered 2018-07-30: 12:00:00 25 ug via INTRAVENOUS
  Filled 2018-07-30: qty 2

## 2018-07-30 MED ORDER — DEXTROSE 50 % IV SOLN
1.0000 | Freq: Once | INTRAVENOUS | Status: AC
  Administered 2018-07-30: 10:00:00 50 mL via INTRAVENOUS
  Filled 2018-07-30: qty 50

## 2018-07-30 MED ORDER — CHLORHEXIDINE GLUCONATE CLOTH 2 % EX PADS: 6.0000 | MEDICATED_PAD | Freq: Every day | CUTANEOUS | Status: DC

## 2018-07-30 MED ORDER — SODIUM CHLORIDE 0.9 % IV SOLN: 1.0000 g | Freq: Once | INTRAVENOUS | Status: DC

## 2018-07-30 MED ORDER — CINACALCET HCL 30 MG PO TABS
30.0000 mg | ORAL_TABLET | Freq: Every day | ORAL | Status: DC
  Administered 2018-07-30: 30 mg via ORAL
  Filled 2018-07-30: qty 1

## 2018-07-30 MED ORDER — GUAIFENESIN-DM 100-10 MG/5ML PO SYRP
5.0000 mL | ORAL_SOLUTION | Freq: Four times a day (QID) | ORAL | Status: DC | PRN
  Administered 2018-07-31 (×2): 5 mL via ORAL
  Filled 2018-07-30: qty 5

## 2018-07-30 MED ORDER — ALUM & MAG HYDROXIDE-SIMETH 200-200-20 MG/5ML PO SUSP
30.0000 mL | Freq: Once | ORAL | Status: DC
  Filled 2018-07-30: qty 30

## 2018-07-30 MED ORDER — INSULIN ASPART 100 UNIT/ML IV SOLN
5.0000 [IU] | Freq: Once | INTRAVENOUS | Status: AC
  Administered 2018-07-30: 10:00:00 5 [IU] via INTRAVENOUS

## 2018-07-30 MED ORDER — SENNOSIDES-DOCUSATE SODIUM 8.6-50 MG PO TABS: 1.0000 | ORAL_TABLET | Freq: Every evening | ORAL | Status: DC | PRN

## 2018-07-30 MED ORDER — ACETAMINOPHEN 325 MG PO TABS
650.0000 mg | ORAL_TABLET | Freq: Four times a day (QID) | ORAL | Status: DC | PRN
  Administered 2018-07-30 (×2): 650 mg via ORAL
  Filled 2018-07-30: qty 2

## 2018-07-30 MED ORDER — ACETAMINOPHEN-CODEINE #3 300-30 MG PO TABS
1.0000 | ORAL_TABLET | ORAL | Status: DC | PRN
  Administered 2018-07-31: 1 via ORAL
  Filled 2018-07-30: qty 1

## 2018-07-30 MED ORDER — CALCIUM GLUCONATE-NACL 1-0.675 GM/50ML-% IV SOLN
1.0000 g | Freq: Once | INTRAVENOUS | Status: AC
  Administered 2018-07-30: 10:00:00 1000 mg via INTRAVENOUS
  Filled 2018-07-30: qty 50

## 2018-07-30 MED ORDER — GABAPENTIN 300 MG PO CAPS
300.0000 mg | ORAL_CAPSULE | Freq: Two times a day (BID) | ORAL | Status: DC
  Administered 2018-07-30 – 2018-07-31 (×2): 300 mg via ORAL
  Filled 2018-07-30 (×3): qty 1

## 2018-07-30 MED ORDER — PROMETHAZINE HCL 25 MG PO TABS: 12.5000 mg | ORAL_TABLET | Freq: Four times a day (QID) | ORAL | Status: DC | PRN

## 2018-07-30 MED ORDER — ACETAMINOPHEN 650 MG RE SUPP: 650.0000 mg | Freq: Four times a day (QID) | RECTAL | Status: DC | PRN

## 2018-07-30 MED ORDER — NITROGLYCERIN 0.4 MG SL SUBL
0.4000 mg | SUBLINGUAL_TABLET | SUBLINGUAL | Status: DC | PRN
  Administered 2018-07-30: 10:00:00 0.4 mg via SUBLINGUAL
  Filled 2018-07-30: qty 1

## 2018-07-30 NOTE — Progress Notes (Addendum)
Eric [name] is a 63 Y/O male with ESRD on hemodialysis T,Th,S at [facility]. PMH: DM, HTN, CAD S/P CABG 1999, H/O cardiac arrest with AICD, HF, AOCD, SHPT, MGUS.  Patient presented to ED this AM with chest pain. He has been admitted as observation patient. We will order and manage HD today and will consult formally if patient status upgraded to in-patient.   HD orders: [place] T,Th,S 3.5 hrs 180 NRe 400/Autoflow 1.5 81 kg 2.0 K/2.0 Ca UFP 4 R AVF -No heparin -Venofer 100 mg IV X 10 doses -Mircera 225 mcg IV q 2 weeks (last dose 07/16/18) -Hectorol 2 mcg IV TIW  Juanell Fairly NP-C [place] [phone] (Pager)  Pt seen, examined and agree w A/P as above.  Kelly Splinter  MD 07/30/2018, 5:23 PM

## 2018-07-30 NOTE — Progress Notes (Signed)
NEW ADMISSION NOTE New Admission Note:   Arrival Method: Bed Mental Orientation: A&O X4 Telemetry: 5M04 Assessment: Completed Skin: See Flowsheets IV: WDL Pain: 0/10 Safety Measures: Safety Fall Prevention Plan has been given, discussed and signed Admission: Completed [facility] [place] Orientation: Patient has been orientated to the room, unit and staff.   Patients wife has been called and updated.   Orders have been reviewed and implemented. Will continue to monitor the patient. Call light has been placed within reach and bed alarm has been activated.   Aneta Mins BSN, RN3

## 2018-07-30 NOTE — Progress Notes (Signed)
Pt. Noted with moderate amount of blood leakage around cannulation site to right upper arm fistula Eric Fairly NP made aware at bedside states that patient has been having issues with bleeding from access at outpatient dialysis center. No new orders at this time.

## 2018-07-30 NOTE — ED Triage Notes (Addendum)
Pt came from HD d/t SOB, generalized  and Cent. CP tightness that started last night. Pt did not receive HD today  Hx HD T,TH,Sat; MI   DEFIBRILLATOR IMPLANT  Given 324 ASA en route

## 2018-07-30 NOTE — H&P (Addendum)
Date: 07/30/2018               Patient Name:  Eric [name] MRN: [id number]  DOB: [birth year] Age / Sex: 63 y.o., male   PCP: Patient, No Pcp Per         Medical Service: Internal Medicine Teaching Service         Attending Physician: Dr. Bartholomew Crews, MD    First Contact: Karma Greaser, MS4 Pager: [phone]  Second Contact: Dr. Frederico Hamman Pager: [phone]       After Hours (After 5p/  First Contact Pager: [phone]  weekends / holidays): Second Contact Pager: [phone]   Chief Complaint: Chest tightness  History of Present Illness:  Mr. 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  The patient describes the chest discomfort as constant, 8/10 intensity, feels like heaviness, present over precordial area, is alleviated when he moves onto his side and worsens when he walks.   The patient has accompanying  Dyspnea, dry cough, chills, sneezing, palpitations, and abdominal pain. He states that his abdominal pain is generalized and started one day ago as well. He denies dizziness, nausea, vomiting, or fevers.   Spoke to patient's wife who stated that the patient noted chest discomfort this morning and he had a hard time walking to bus stop in order to go to dialysis. Was very weak. She also mentioned that the patient has been eating more carbohydrates recently and that he has been more lethargic.   EMS brought patient to the hospital from dialysis center. In route to the hospital the patient was given aspirin 324 by EMS.  Patient denies fever, chills, abdominal pain, diarrhea, dizziness.  In the ED, the patient was afebrile, pulse in the 70s, tachypneic in the 20s, normotensive, saturating between 89-90s on room air for which he was placed on 2L nasal cannula.  He was given calcium gluconate, insulin,  dextrose, and 0.4 mg nitro.  His chest discomfort was not alleviated with nitroglycerin.  Meds: Reviewed with patient's wife Current Meds  Medication Sig   acetaminophen (TYLENOL) 325 MG tablet Take 2 tablets (650 mg total) by mouth every 6 (six) hours as needed for mild pain or moderate pain.   amLODipine (NORVASC) 10 MG tablet Take 10 mg by mouth daily.   aspirin EC 81 MG tablet Take 81 mg by mouth daily.    calcium acetate (PHOSLO) 667 MG capsule Take 667 mg by mouth 3 (three) times daily with meals.    cinacalcet (SENSIPAR) 30 MG tablet Take 30 mg by mouth daily with supper.   furosemide (LASIX) 80 MG tablet Take 1 tablet (80 mg total) by mouth every morning. Do not take on days of dialysis   gabapentin (NEURONTIN) 300 MG capsule Take 1 capsule (300 mg total) by mouth 2 (two) times daily.   hydrALAZINE (APRESOLINE) 10 MG tablet Take 10 mg by mouth daily as needed (anxiety).   isosorbide mononitrate (IMDUR) 60 MG 24 hr tablet Take 60 mg by mouth as needed (Heart Rate).    metoprolol tartrate (LOPRESSOR) 25 MG tablet Take 1 tablet (25 mg total) by mouth 2 (two) times daily.   multivitamin (RENA-VIT) TABS tablet Take 1 tablet by mouth at bedtime.   pantoprazole (PROTONIX) 40 MG tablet Take 40  mg by mouth as needed (acid reflux).    rosuvastatin (CRESTOR) 20 MG tablet Take 1 tablet (20 mg total) by mouth daily at 6 PM.     Allergies: Allergies as of 07/30/2018 - Review Complete 07/30/2018  Allergen Reaction Noted   Gabapentin Diarrhea 12/07/2015   Past Medical History:  Diagnosis Date   AICD (automatic cardioverter/defibrillator) present    Cardiac arrest ([place]) 03/07/2013   Cardiac arrest due to underlying cardiac condition (HCC)    CHF (congestive heart failure) ([place])    CKD (chronic kidney disease), stage III ([place])    Complication of anesthesia    got too much anesthesia and bp dropped very low Feb, 2019   Edema    ESRD on dialysis [facility])    History of  blood transfusion 1950's   "related to pinal menigitis; HAD 16 OPERATIONS TOTAL"   Hyperlipemia    Hypertension    Ileus ([place]) 05/2015   Myocardial infarction ([place]) 03/2013   Pain in the chest    Pneumonia 2016   Rash and nonspecific skin eruption 06/29/2014   Scabies infestation 06/29/2014   Shortness of breath dyspnea    Type II diabetes mellitus ([place])    type 2   Ventricular tachycardia ([place])     Family History:   Mother htn, dm, renal disease, heart disease  Social History:   Lives in [place] with wife, daughter and son in law [place] from [place] July 2019 Retired special ed Pharmacist, hospital Former smoker, quit 30 yrs ago, used to smoke 1 ppd for 15 yrs  Denies etoh or drugs   Review of Systems: A complete ROS was negative except as per HPI.   Physical Exam: Blood pressure 103/76, pulse 70, temperature 98.2 F (36.8 C), temperature source Oral, resp. rate 14, height 5\' 6"  (1.676 m), weight 83 kg, SpO2 99 %.  Physical Exam  Constitutional: He is oriented to person, place, and time. He appears well-developed and well-nourished. He appears lethargic. He is easily aroused. No distress.  HENT:  Head: Normocephalic and atraumatic.  Class III Mallampati  Eyes: Conjunctivae are normal.  Neck: JVD (5cm above sternal angle) present.  Cardiovascular: Normal rate, regular rhythm, normal heart sounds and intact distal pulses.  Respiratory: Effort normal and breath sounds normal. No respiratory distress. He has no wheezes.  GI: Soft. Bowel sounds are normal. He exhibits no distension and no mass. There is abdominal tenderness (ruq and epigastric region).  Musculoskeletal:        General: No edema.  Neurological: He is oriented to person, place, and time and easily aroused. He appears lethargic.  Skin: He is not diaphoretic. No erythema.  Psychiatric: He has a normal mood and affect. His behavior is normal. Judgment and thought content normal.   Labs: CMP: Potassium 5.2, bicarb 94,  creatinine 9.64, BUN 55, anion gap 15 CBC: WBC 4.6, hemoglobin 9.5, hematocrit 31.8, platelets 53, MCV 107 Troponin 28>24 BNP: 2572 COVID19 negative   EKG: personally reviewed my interpretation is ventricularly paced rhythm. Sinus. t wave inversions in v1  CXR: personally reviewed my interpretation is no effusions or consolidations.  Assessment & Plan by Problem: Active Problems:   Atypical chest pain  Mr. Grochowski is a patient with esrd, cad, hx of vfib, hfref who presents with one day history of chest heaviness.  Atypical chest pain  The patients symptoms seem to be more consistent with atypical chest pain given that symptoms have been constantly present since yesterday, positional, accompanied with abdominal pain and  in the setting of missed dialysis. The patient does have several risk factors though and with a heart score of 5, will need to carefully rule out cardiac causes of chest pain.   Troponin elevation 28>24 maybe due to ESRD.  EKG with solely t wave inversions in v1 and not in other contiguous leads.   -Trend troponin -repeat EKG -GI cocktail  HFrEF Patient's last echo was done January 2020 which showed EF 93%, grade 1 diastolic dysfunction, concentric hypertrophy of left ventricle.  On prior echo done in 2017 there is also concern for possible pulmonary artery hypertension, but was not estimated on TTE done Jan 2020. BNP elevation maybe moreso due to renal dysfunction as patient does not have peripheral edema, but with jvd.  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 Last dialysis session was on Saturday, 07/27/2018 at [facility]  Access site is [facility].  Potassium 5.2, creatinine 9.6.  Patient got insulin and calcium gluconate in ED  for hyperkalemia which should further correct with dialysis.   -Patient is to get dialyzed today 7/7 -Appreciate nephrology following -continue cinacalcet 30mg  qd  -continue phoslo 667mg  tid   CAD s/p CABG 1999 Vfib arrest with AICD 2015 Patient has diffuse disease in distal lad, rca, patent lima to lad, and svg to om1.   Patient's primary cardiologist is Dr. Einar Gip.  Last remote ICD check done in May 2020 showed RV pacing 79% battery longevity 4.2 years.  At home patient is on Imdur 60 mg as needed, metoprolol tartrate 25 mg twice daily, rosuvastatin 20 mg nightly, aspirin 81 mg daily.   -trend troponin  -Continue aspirin 81 mg daily -holding metoprolol 25mg  bid given pts low bp and hr -holding imdur 60mg  prn given patients low bp -Continue rosuvastatin 20 mg nightly  Diabetes mellitus type 2 His last A1c was 4.7 in December 2018. Patient is not on any anti-glycemic agents.  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 He has not followed with anyone regarding treatment for this.   -Should follow up with Infectious disease outpatient to get therapy  [facility]  -Follow with Dr. Marin Olp  Hyperlipidemia Patient's last lipid panel was done in December 2019 which showed total cholesterol 148, triglycerides 113, HDL 32, LDL 93.  ASCVD risk score is 40.7% of a major cardiac event in next 10 yrs.   -Continue rosuvastatin 20 mg nightly  Functional deterioration  Patient's lethargy maybe due to sleep apnea. Will need sleep study outpatient. Will order PT, OT to evaluate for home health needs.   Dispo: Admit patient to Observation with expected length of stay less than 2 midnights. Consulted case management to establish care with a pcp in [place].  SignedLars Mage, MD 07/30/2018, 12:50 PM  Pager: [phone]

## 2018-07-30 NOTE — Progress Notes (Signed)
Renal Navigator notified [facility] clinic[facility] of patient's negative COVID 19 test result in order to provide continuity of care and safety.  Alphonzo Cruise, [place] Renal Navigator [phone]

## 2018-07-30 NOTE — ED Provider Notes (Signed)
[facility] EMERGENCY DEPARTMENT Provider Note   CSN: [id number] Arrival date & time: 07/30/18  4580     History   Chief Complaint Chief Complaint  Patient presents with   Shortness of Breath    HPI Alma Muegge is a 63 y.o. male with a hx of CAD s/p CABG, prior cardiac arrest, AICD in place, CHF, ESRD on dialysis T/R/Sat, HTN, hyperlipidemia, T2DM, hepatic cirrhosis, & hep C who presents to the ED w/ complaints of chest pain & dyspnea which began last night. Patient states that he woke from sleep last night w/ dyspnea & chest heaviness- sxs constant since onset, currently 10/10 in severity, worse when laying supine or on his side, no alleviating factors. No change w/ exertion or deep breath. He states he has had associated productive cough w/ yellow phlegm sputum, nausea without vomiting, upper abdominal pain, & BLE swelling. States sxs feel similar to prior MI. He dialyzes T/R/Sat- most recently fully dialyzed Saturday, went to dialysis this AM & told them his sxs therefore EMS was called- did not receive dialysis. EMS provided 324 of ASA en route. Denies fever, chills, emesis, diarrhea, melena, hematochezia, diaphoresis, syncope, or dysuria.      HPI  Past Medical History:  Diagnosis Date   AICD (automatic cardioverter/defibrillator) present    Cardiac arrest ([place]) 03/07/2013   Cardiac arrest due to underlying cardiac condition [facility])    CHF (congestive heart failure) ([place])    CKD (chronic kidney disease), stage III ([place])    Complication of anesthesia    got too much anesthesia and bp dropped very low Feb, 2019   Edema    ESRD on dialysis [facility])    History of blood transfusion 1950's   "related to pinal menigitis; HAD 16 OPERATIONS TOTAL"   Hyperlipemia    Hypertension    Ileus ([place]) 05/2015   Myocardial infarction ([place]) 03/2013   Pain in the chest    Pneumonia 2016   Rash and nonspecific skin eruption 06/29/2014   Scabies infestation  06/29/2014   Shortness of breath dyspnea    Type II diabetes mellitus ([place])    type 2   Ventricular tachycardia [facility])     Patient Active Problem List   Diagnosis Date Noted   S/P CABG (coronary artery bypass graft) 04/01/2018   Claudication ([place]) 04/01/2018   Chronic diastolic (congestive) heart failure ([place]) 04/01/2018   Paresthesia 12/07/2015   BPH (benign prostatic hyperplasia)    Atherosclerosis of native coronary artery of native heart without angina pectoris 99/83/3825   Chronic systolic (congestive) heart failure ([place]) 10/01/2015   Encounter for fitting or adjustment of implantable cardioverter-defibrillator (ICD)    Ileus, postoperative (HCC)    Impaired nasal gastric feeding tube    Impaired nasogastric feeding tube    Ileus (HCC)    ESRD (end stage renal disease) ([place])    Gallstones    Gallbladder calculus 06/15/2015   Numbness    RUQ abdominal pain    CHF exacerbation ([place]) 03/10/2015   Heart failure, acute on chronic, systolic and diastolic ([place]) 05/39/7673   Acute on chronic congestive heart failure (HCC)    Diabetes mellitus with complication (HCC)    CHF (congestive heart failure) ([place]) 08/03/2014   Hypertensive urgency 08/03/2014   Controlled type 2 diabetes with neuropathy ([place]) 08/03/2014   Obesity (BMI 30-39.9) 08/03/2014   Bilateral leg pain 08/03/2014   Acute on chronic combined systolic and diastolic CHF (congestive heart failure) ([place]) 07/02/2014   CKD (chronic  kidney disease) stage 3, GFR 30-59 ml/min (HCC) 06/29/2014   DM (diabetes mellitus), type 2 with renal complications ([place]) 23/55/7322   Single chamber ICD-St.Jude 1411-36C Ellipse VR-CorVue-Merlin (DOI: 03/24/2013) in situ 06/29/2014   Essential hypertension, benign 06/29/2014   Hepatic cirrhosis ([place]) 11/28/2013   Cardiomyopathy, ischemic 11/28/2013   Hepatitis C antibody test positive 11/28/2013   Cirrhosis ([place])    Type 2 diabetes mellitus with  hyperglycemia ([place])    Diabetes mellitus with hypoglycemia ([place]) 04/29/2013   HCAP (healthcare-associated pneumonia) 04/28/2013   DM type 2 (diabetes mellitus, type 2) ([place]) 04/28/2013   Hyperlipemia    Ventricular fibrillation ([place]) 03/08/2013   Seizure ([place]) 03/07/2013    Past Surgical History:  Procedure Laterality Date   AV FISTULA PLACEMENT Right 03/18/2015   Procedure: BRACHIOCEPHALIC ARTERIOVENOUS (AV) FISTULA CREATION;  Surgeon: Angelia Mould, MD;  Location: [facility];  Service: Vascular;  Laterality: Right;   BACK SURGERY  1950's   "for spinal menigitis; HAD 16 OPERATIONS TOTAL"   CARDIAC CATHETERIZATION     CHOLECYSTECTOMY N/A 06/17/2015   Procedure: LAPAROSCOPIC CHOLECYSTECTOMY;  Surgeon: Coralie Keens, MD;  Location: [facility];  Service: General;  Laterality: N/A;   CORONARY ARTERY BYPASS GRAFT  1999   cabg x4   EYE SURGERY Bilateral 1950's   "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"   HEAD SURGERY  1950'S   "for spinal menigitis; HAD 16 OPERATIONS TOTAL"   IMPLANTABLE CARDIOVERTER DEFIBRILLATOR IMPLANT  03/24/2013   STJ single chamber ICD implanted by Dr Caryl Comes for secondary prevention   IMPLANTABLE CARDIOVERTER DEFIBRILLATOR IMPLANT N/A 03/24/2013   Procedure: IMPLANTABLE CARDIOVERTER DEFIBRILLATOR IMPLANT;  Surgeon: Deboraha Sprang, MD;  Location: [facility] CATH LAB;  Service: Cardiovascular;  Laterality: N/A;   INSERTION OF DIALYSIS CATHETER N/A 03/13/2015   Procedure: INSERTION OF TUNNELED DIALYSIS CATHETER;  Surgeon: Conrad Hilo, MD;  Location: [facility];  Service: Vascular;  Laterality: N/A;   LEFT HEART CATHETERIZATION WITH CORONARY/GRAFT ANGIOGRAM  03/07/2013   Procedure: LEFT HEART CATHETERIZATION WITH [name];  Surgeon: Clent Demark, MD;  Location: [facility] CATH LAB;  Service: Cardiovascular;;   REVISON OF ARTERIOVENOUS FISTULA Right 01/03/2018   Procedure: REVISION OF ARTERIOVENOUS FISTULA;  Surgeon: Waynetta Sandy, MD;  Location: [facility];  Service: Vascular;  Laterality: Right;   UMBILICAL HERNIA REPAIR N/A 06/24/2015   Procedure: HERNIA REPAIR UMBILICAL ADULT;  Surgeon: Georganna Skeans, MD;  Location: [facility];  Service: General;  Laterality: N/A;        Home Medications    Prior to Admission medications   Medication Sig Start Date End Date Taking? Authorizing Provider  acetaminophen (TYLENOL) 325 MG tablet Take 2 tablets (650 mg total) by mouth every 6 (six) hours as needed for mild pain or moderate pain. 07/01/15   Smiley Houseman, MD  amLODipine (NORVASC) 10 MG tablet Take 10 mg by mouth daily. 05/23/18   [provider]  aspirin EC 81 MG tablet Take 81 mg by mouth daily.     [provider]  calcium acetate (PHOSLO) 667 MG capsule Take by mouth 3 (three) times daily with meals.    [provider]  cinacalcet (SENSIPAR) 30 MG tablet Take 30 mg by mouth daily with supper.    [provider]  furosemide (LASIX) 80 MG tablet Take 1 tablet (80 mg total) by mouth every morning. Do not take on days of dialysis 07/17/18 10/15/18  Adrian Prows, MD  gabapentin (NEURONTIN) 300 MG capsule Take 1 capsule (  300 mg total) by mouth 2 (two) times daily. 04/01/18   Miquel Dunn, NP  hydrALAZINE (APRESOLINE) 10 MG tablet Take 10 mg by mouth daily as needed (anxiety).    [provider]  isosorbide mononitrate (IMDUR) 60 MG 24 hr tablet Take 60 mg by mouth as needed.     [provider]  metoprolol tartrate (LOPRESSOR) 25 MG tablet Take 1 tablet (25 mg total) by mouth 2 (two) times daily. 05/29/18 08/27/18  Adrian Prows, MD  multivitamin (RENA-VIT) TABS tablet Take 1 tablet by mouth at bedtime. 07/01/15   Smiley Houseman, MD  pantoprazole (PROTONIX) 40 MG tablet Take 40 mg by mouth continuous as needed.     [provider]  rosuvastatin (CRESTOR) 20 MG tablet Take 1 tablet (20 mg total) by mouth daily at 6 PM. 01/05/18   Kayleen Memos, DO    Family History Family History    Problem Relation Age of Onset   Heart disease Mother    Diabetes Mother     Social History Social History   Tobacco Use   Smoking status: Former Smoker    Packs/day: 1.00    Years: 5.00    Pack years: 5.00    Types: Cigarettes   Smokeless tobacco: Never Used  Substance Use Topics   Alcohol use: No    Alcohol/week: 0.0 standard drinks   Drug use: No     Allergies   Gabapentin   Review of Systems Review of Systems  Constitutional: Negative for chills and fever.  Respiratory: Positive for cough and shortness of breath.   Cardiovascular: Positive for chest pain and leg swelling.  Gastrointestinal: Positive for abdominal pain and nausea. Negative for blood in stool, constipation, diarrhea and vomiting.  Neurological: Negative for dizziness and syncope.  All other systems reviewed and are negative.    Physical Exam Updated Vital Signs There were no vitals taken for this visit.  Physical Exam Vitals signs and nursing note reviewed.  Constitutional:      Appearance: He is well-developed. He is not toxic-appearing.  HENT:     Head: Normocephalic and atraumatic.  Eyes:     General:        Right eye: No discharge.        Left eye: No discharge.     Conjunctiva/sclera: Conjunctivae normal.  Neck:     Musculoskeletal: Neck supple.  Cardiovascular:     Rate and Rhythm: Normal rate and regular rhythm.     Pulses:          Radial pulses are 2+ on the right side and 2+ on the left side.       Dorsalis pedis pulses are 2+ on the right side and 2+ on the left side.  Pulmonary:     Effort: No respiratory distress.     Breath sounds: Decreased breath sounds (bibasilar) present. No wheezing.     Comments: SPO2 Ranging 89-97% on RA- placed on 2L via [place] maintaining > 95%.  Chest:     Chest wall: Tenderness (anterior chest wall) present.     Comments: Anterior chest surgical scar well healed.  L chest wall pacemaker in place Abdominal:     General: There is  distension (mild).     Palpations: Abdomen is soft.     Tenderness: There is abdominal tenderness (upper abdomen, mild). There is no guarding or rebound.  Musculoskeletal:     Comments: RUE: Fistula in place w/ palpable thrill.  LEs: Symmetric 1+  pitting edema to the lower legs.   Skin:    General: Skin is warm and dry.     Findings: No rash.  Neurological:     Mental Status: He is alert.     Comments: Clear speech.   Psychiatric:        Behavior: Behavior normal.     ED Treatments / Results  Labs (all labs ordered are listed, but only abnormal results are displayed) Labs Reviewed - No data to display  EKG EKG Interpretation  Date/Time:  Tuesday July 30 2018 08:42:49 EDT Ventricular Rate:  70 PR Interval:    QRS Duration: 161 QT Interval:  477 QTC Calculation: 515 R Axis:   -91 Text Interpretation:  Unclear rhythm, appears to be junctional IVCD, consider atypical RBBB, LAFB, QRS wider than previous LVH with secondary repolarization abnormality Inferior infarct, old Anterolateral infarct, age indeterminate Confirmed by Gareth Morgan [phone]) on 07/30/2018 8:50:36 AM   Radiology Dg Chest Portable 1 View  Result Date: 07/30/2018 CLINICAL DATA:  Chest pain and shortness of breath EXAM: PORTABLE CHEST 1 VIEW COMPARISON:  January 01, 2018 FINDINGS: There is cardiomegaly with pulmonary vascularity within normal limits. Pacemaker lead is attached to the right ventricle. No evident edema or consolidation. Patient is status post median sternotomy. No adenopathy. No bone lesions. IMPRESSION: Cardiomegaly. No evident edema or consolidation. Pacemaker lead attached to right ventricle. Electronically Signed   By: Lowella Grip III M.D.   On: 07/30/2018 09:16    Procedures Procedures (including critical care time) CRITICAL CARE Performed by: Kennith Maes   Total critical care time: 30 minutes  Critical care time was exclusive of separately billable procedures and treating  other patients.  Critical care was necessary to treat or prevent imminent or life-threatening deterioration.  Critical care was time spent personally by me on the following activities: development of treatment plan with patient and/or surrogate as well as nursing, discussions with consultants, evaluation of patient's response to treatment, examination of patient, obtaining history from patient or surrogate, ordering and performing treatments and interventions, ordering and review of laboratory studies, ordering and review of radiographic studies, pulse oximetry and re-evaluation of patient's condition.  Medications Ordered in ED Medications - No data to display   Initial Impression / Assessment and Plan / ED Course  I have reviewed the triage vital signs and the nursing notes.  Pertinent labs & imaging results that were available during my care of the patient were reviewed by me and considered in my medical decision making (see chart for details).   Patient arrives to the ED via EMS with complaints of chest heaviness & dyspnea. Nontoxic appearing. Vitals WNL with the exception of borderline SPO2 ranging 89-97% on RA- placed on 2L via [place] w/ good response. Does not appear in respiratory distress. Heart RRR, breath sounds decreased @ the bases, has some mild upper abdominal tenderness w/ mild distension noted & 1+ symmetric BLE edema. DDX: ACS, Fluid overload (CHF vs. ESRD needing dialysis), PE, dissection, pneumonia, anemia, electrolyte derangement.   EKG: Unclear rhythm, appears to be junctional IVCD, consider atypical RBBB, LAFB, QRS wider than previous LVH with secondary repolarization abnormality Inferior infarct, old Anterolateral infarct, age indeterminate ---> appears wider than prior- discussed w/ Dr. Billy Fischer who has reviewed EKG recommends insulin/dextrose/calcium gluconate for possibly hyperkalemia leading to this CBG: 140--> meds ordered  CXR: Cardiomegaly. No evident edema or  consolidation. Pacemaker lead attached to right ventricle.  CBC: Anemia somewhat similar to prior, no leukocytosis.  CMP:  Hyperkalemia @ 5.2- has received insulin & calcium gluconate for this. Renal function consistent w/ ESRD. Glucose 140. LFTs WNL Lipase: WNL Initial hs troponin: 28 BNP: elevated @ 2,572.9   Plan for admission for chest pain given patient's extensive cardiac history & he states this feels like prior MI. Sxs may be due to fluid overload related to dialysis/HF. Feel PE or dissection are less likely. CXR w/o focal infiltrate to indicate pneumonia, covid testing pending.   10:23: CONSULT: Discussed w/ nephrologist Dr. Jonnie Finner, aware of patient & need for dialysis.   10:33: CONSULT: Discussed w/ teaching service- accept admission pending covid swab.   This is a shared visit with supervising physician Dr. Billy Fischer who has independently evaluated patient & provided guidance in evaluation/management/disposition, in agreement with care    Covid negative.   Sean Malinowski was evaluated in Emergency Department on 07/30/2018 for the symptoms described in the history of present illness. He/she was evaluated in the context of the global COVID-19 pandemic, which necessitated consideration that the patient might be at risk for infection with the SARS-CoV-2 virus that causes COVID-19. Institutional protocols and algorithms that pertain to the evaluation of patients at risk for COVID-19 are in a state of rapid change based on information released by regulatory bodies including the CDC and federal and state organizations. These policies and algorithms were followed during the patient's care in the ED.   Final Clinical Impressions(s) / ED Diagnoses   Final diagnoses:  Chest pain, unspecified type  Hyperkalemia  ESRD (end stage renal disease) [facility])    ED Discharge Orders    None       Amaryllis Dyke, PA-C 07/30/18 1437    Gareth Morgan, MD 08/02/18 2019

## 2018-07-31 ENCOUNTER — Observation Stay (HOSPITAL_COMMUNITY): Payer: Medicare Other

## 2018-07-31 DIAGNOSIS — Z1159 Encounter for screening for other viral diseases: Secondary | ICD-10-CM | POA: Diagnosis not present

## 2018-07-31 DIAGNOSIS — N186 End stage renal disease: Secondary | ICD-10-CM | POA: Diagnosis not present

## 2018-07-31 DIAGNOSIS — G8929 Other chronic pain: Secondary | ICD-10-CM | POA: Diagnosis not present

## 2018-07-31 DIAGNOSIS — R109 Unspecified abdominal pain: Secondary | ICD-10-CM

## 2018-07-31 DIAGNOSIS — E875 Hyperkalemia: Secondary | ICD-10-CM | POA: Diagnosis not present

## 2018-07-31 DIAGNOSIS — R0789 Other chest pain: Secondary | ICD-10-CM | POA: Diagnosis not present

## 2018-07-31 DIAGNOSIS — R05 Cough: Secondary | ICD-10-CM

## 2018-07-31 DIAGNOSIS — I2510 Atherosclerotic heart disease of native coronary artery without angina pectoris: Secondary | ICD-10-CM | POA: Diagnosis not present

## 2018-07-31 LAB — CBC
HCT: 27.9 % — ABNORMAL LOW (ref 39.0–52.0)
Hemoglobin: 8.6 g/dL — ABNORMAL LOW (ref 13.0–17.0)
MCH: 32.2 pg (ref 26.0–34.0)
MCHC: 30.8 g/dL (ref 30.0–36.0)
MCV: 104.5 fL — ABNORMAL HIGH (ref 80.0–100.0)
Platelets: 47 10*3/uL — ABNORMAL LOW (ref 150–400)
RBC: 2.67 MIL/uL — ABNORMAL LOW (ref 4.22–5.81)
RDW: 16.8 % — ABNORMAL HIGH (ref 11.5–15.5)
WBC: 3.5 10*3/uL — ABNORMAL LOW (ref 4.0–10.5)
nRBC: 0 % (ref 0.0–0.2)

## 2018-07-31 LAB — BASIC METABOLIC PANEL
Anion gap: 11 (ref 5–15)
BUN: 35 mg/dL — ABNORMAL HIGH (ref 8–23)
CO2: 27 mmol/L (ref 22–32)
Calcium: 8.2 mg/dL — ABNORMAL LOW (ref 8.9–10.3)
Chloride: 93 mmol/L — ABNORMAL LOW (ref 98–111)
Creatinine, Ser: 6.86 mg/dL — ABNORMAL HIGH (ref 0.61–1.24)
GFR calc Af Amer: 9 mL/min — ABNORMAL LOW (ref >60)
GFR calc non Af Amer: 8 mL/min — ABNORMAL LOW (ref >60)
Glucose, Bld: 75 mg/dL (ref 70–99)
Potassium: 4.7 mmol/L (ref 3.5–5.1)
Sodium: 131 mmol/L — ABNORMAL LOW (ref 135–145)

## 2018-07-31 MED ORDER — CHLORHEXIDINE GLUCONATE CLOTH 2 % EX PADS
6.0000 | MEDICATED_PAD | Freq: Every day | CUTANEOUS | Status: DC
  Administered 2018-07-31: 6 via TOPICAL

## 2018-07-31 MED ORDER — DOXERCALCIFEROL 4 MCG/2ML IV SOLN: 2.0000 ug | INTRAVENOUS | Status: DC

## 2018-07-31 MED ORDER — BENZONATATE 100 MG PO CAPS: 100.0000 mg | ORAL_CAPSULE | Freq: Three times a day (TID) | ORAL | 0 refills | Status: DC | PRN

## 2018-07-31 MED ORDER — BENZONATATE 100 MG PO CAPS
100.0000 mg | ORAL_CAPSULE | Freq: Three times a day (TID) | ORAL | Status: DC | PRN
  Administered 2018-07-31 (×2): 100 mg via ORAL
  Filled 2018-07-31 (×2): qty 1

## 2018-07-31 NOTE — Progress Notes (Signed)
Eric [name] to be discharged home per MD order. Discussed prescriptions and follow up appointments with the patient. Prescriptions given to patient; medication list explained in detail. Patient verbalized understanding.  Skin clean, dry and intact without evidence of skin break down, no evidence of skin tears noted. IV catheter discontinued intact. Site without signs and symptoms of complications. Dressing and pressure applied. Pt denies pain at the site currently. No complaints noted.  Patient free of lines, drains, and wounds.   An After Visit Summary (AVS) was printed and given to the patient. Patient escorted via wheelchair, and discharged home via private auto.  Baldo Ash, RN

## 2018-07-31 NOTE — Discharge Summary (Signed)
Name: Eric [name] MRN: [id number] DOB: [birth year] 63 y.o. PCP: Patient, No Pcp Per  Date of Admission: 07/30/2018  8:32 AM Date of Discharge: 07/31/2018 Attending Physician: Bartholomew Crews, MD  Discharge Diagnosis: 1. Atypical chest pain  2. 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 Cough 4. ESRD    Discharge Medications: Allergies as of 07/31/2018      Reactions   Gabapentin Diarrhea   diarhea      Medication List    TAKE these medications   acetaminophen 325 MG tablet Commonly known as: TYLENOL Take 2 tablets (650 mg total) by mouth every 6 (six) hours as needed for mild pain or moderate pain.   amLODipine 10 MG tablet Commonly known as: NORVASC Take 10 mg by mouth daily.   aspirin EC 81 MG tablet Take 81 mg by mouth daily.   benzonatate 100 MG capsule Commonly known as: TESSALON Take 1 capsule (100 mg total) by mouth 3 (three) times daily as needed for cough.   calcium acetate 667 MG capsule Commonly known as: PHOSLO Take 667 mg by mouth 3 (three) times daily with meals.   cinacalcet 30 MG tablet Commonly known as: SENSIPAR Take 30 mg by mouth daily with supper.   furosemide 80 MG tablet Commonly known as: LASIX Take 1 tablet (80 mg total) by mouth every morning. Do not take on days of dialysis   gabapentin 300 MG capsule Commonly known as: Neurontin Take 1 capsule (300 mg total) by mouth 2 (two) times daily.   hydrALAZINE 10 MG tablet Commonly known as: APRESOLINE Take 10 mg by mouth daily as needed (anxiety).   isosorbide mononitrate 60 MG 24 hr tablet Commonly known as: IMDUR Take 60 mg by mouth as needed (Heart Rate).   metoprolol tartrate 25 MG tablet Commonly known as: LOPRESSOR Take 1 tablet (25 mg total) by mouth 2 (two) times daily.   multivitamin Tabs tablet Take 1 tablet by mouth at bedtime.   pantoprazole 40 MG tablet Commonly known as: PROTONIX Take 40 mg by mouth as needed (acid reflux).   rosuvastatin 20 MG tablet Commonly  known as: CRESTOR Take 1 tablet (20 mg total) by mouth daily at 6 PM.       Disposition and follow-up:   Eric [name] was discharged from [facility] in Stable condition.  At the hospital follow up visit please address:  1.  Please assess for ongoing chest pain and associated symptoms. Ensure he has follow up with cardiology, GI, and ID. Recommend referral to IR for paracentesis to help with GI symptoms. We also recommend a sleep study to check for OSA for further evaluation of chronic cough.    2.  Labs / imaging needed at time of follow-up: None   3.  Pending labs/ test needing follow-up: None   Follow-up Appointments:   Hospital Course by problem list:  1. Atypical chest pain: On admission, patient with complaints of non-radiating intermittent sternal discomfort without radiation present for the past few months. Chronicity of symptoms as well as high sensitivity troponins and EKG results were less concerning for development of ACS. Follows-up regularly with his cardiologist with his most recent appointment in June. Recommend continued follow-up outpatient at this time.  2. 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 Diagnosis was noted back in 2015 with no indication of follow-up found when reviewing patient chart. Evaluation with [place] notable for nodular contour of the liver with no  definite focal liver lesion identified. We recommend outpatient follow-up with GI and ID for further evaluation and management of his cirrhosis and Hep C.  3. Chronic Abdominal Pain: Etiology unclear at this time. Given patient's underlying history of cirrhosis, his current complaints of abdominal pain and discomfort may be associated with the presence of ascites as demonstrated on abdominal US. He remained afebrile with no leukocytosis making SBP less likely. Lack of concerning physical exam findings and normal lipase also did not  support the presence of pancreatitis. He has not had any weight fluctuations, appetite changes, or postprandial abdominal pain which would be symptoms associated with chronic mesenteric ischemia. He would benefit from outpatient follow-up with GI to undergo diagnostic paracentesis for analysis of ascitic fluid and assess need for antibiotic intervention.  4. Dry Cough: Complaints of persistent dry cough that worsens when lying in bed. Work-up with chest x-ray was not consistent with pneumonia and he has remained afebrile without leukocytosis. He is COVID negative and is not on an ACE or an ARB. As reported by the nephrology NP, there has been no significant changes in his dry weight during his outpatient dialysis sessions. This along with the lack of edema on CXR make fluid overload a less likely cause of his symptoms. OSA is another potential etiology for his cough as he does have significant abdominal girth. If symptoms persist he will likely need outpatient work-up with sleep study.  5. ESRD: Patient received HD during this admission. He will return to his usual HD schedule at discharge.    Discharge Vitals:   BP 113/69 (BP Location: Right Arm)   Pulse 70   Temp 97.6 F (36.4 C) (Oral)   Resp 18   Ht 5\' 6"  (1.676 m)   Wt 82.5 kg   SpO2 92% Comment: laying in bed   BMI 29.36 kg/m   Pertinent Labs, Studies, and Procedures:   CBC Latest Ref Rng & Units 07/31/2018 07/30/2018 01/05/2018  WBC 4.0 - 10.5 K/uL 3.5(L) 4.6 4.4  Hemoglobin 13.0 - 17.0 g/dL 8.6(L) 9.5(L) 10.2(L)  Hematocrit 39.0 - 52.0 % 27.9(L) 31.8(L) 31.7(L)  Platelets 150 - 400 K/uL 47(L) 53(L) 115(L)   BMP Latest Ref Rng & Units 07/31/2018 07/30/2018 01/05/2018  Glucose 70 - 99 mg/dL 75 140(H) 92  BUN 8 - 23 mg/dL 35(H) 55(H) 42(H)  Creatinine 0.61 - 1.24 mg/dL 6.86(H) 9.64(H) 7.57(H)  Sodium 135 - 145 mmol/L 131(L) 135 128(L)  Potassium 3.5 - 5.1 mmol/L 4.7 5.2(H) 4.7  Chloride 98 - 111 mmol/L 93(L) 94(L) 90(L)  CO2 22 - 32  mmol/L 27 26 25   Calcium 8.9 - 10.3 mg/dL 8.2(L) 9.4 8.4(L)   Abdominal US: IMPRESSION: Nodular contour of the liver which can be seen in cirrhosis of liver. No definite focal liver lesion is identified. Prior cholecystectomy. Left kidney is not visualized due to patient body habitus.  CXR: FINDINGS: There is cardiomegaly with pulmonary vascularity within normal limits. Pacemaker lead is attached to the right ventricle. No evident edema or consolidation. Patient is status post median sternotomy. No adenopathy. No bone lesions.  Discharge Instructions:   Signed: Luna Fuse, Medical Student 07/31/2018, 2:58 PM   Pager: [phone]

## 2018-07-31 NOTE — Plan of Care (Signed)
  Problem: Education: Goal: Knowledge of General Education information will improve Description Including pain rating scale, medication(s)/side effects and non-pharmacologic comfort measures Outcome: Progressing   

## 2018-07-31 NOTE — Care Management Obs Status (Signed)
[place] NOTIFICATION   Patient Details  Name: Eric [name] MRN: [id number] Date of Birth: 1955/03/27   Medicare Observation Status Notification Given:  Yes    Bartholomew Crews, RN 07/31/2018, 3:14 PM

## 2018-07-31 NOTE — TOC Initial Note (Signed)
Transition of Care [facility]) - Initial/Assessment Note    Patient Details  Name: Eric [name] MRN: [id number] Date of Birth: [birth year]  Transition of Care [facility]) CM/SW Contact:    Bartholomew Crews, RN Phone Number: [phone] 07/31/2018, 3:26 PM  Clinical Narrative:                 Spoke with patient at the bedside to discuss PT recommendations. Patient declined [place] at this time.   Discussed with patient getting PCP. Patient stated that he is in the process of setting up with PCP, but could not recall name. He thinks it may be a [facility] provider, but could not confirm this. His wife is Therapist, nutritional of the details, but he declined CM offer to call wife. Encouraged follow through with PCP connection. Patient verbalized understanding.   Discussed with patient referrals for GI and ID providers, and encouraged follow up. Advised that information should be on AVS.   PTA home with spouse, daughter, and son. Uses [employer] for transportation to/from hemodialysis. Pharmacy - [facility] - states they pick up prescriptions in person. States that he has a walker at home, but no other DME.   No other transition of care needs identified.   Expected Discharge Plan: Home/Self Care Barriers to Discharge: No Barriers Identified   Patient Goals and CMS Choice   CMS Medicare.gov Compare Post Acute Care list provided to:: Patient Choice offered to / list presented to : Patient  Expected Discharge Plan and Services Expected Discharge Plan: Home/Self Care In-house Referral: NA Discharge Planning Services: CM Consult Post Acute Care Choice: [place] arrangements for the past 2 months: Single Family Home                 DME Arranged: N/A DME Agency: NA       HH Arranged: Refused [place] Agency: NA        Prior Living Arrangements/Services Living arrangements for the past 2 months: Single Family Home Lives with:: Self, Spouse, Adult Children Patient language and need for interpreter  reviewed:: Yes Do you feel safe going back to the place where you live?: Yes      Need for Family Participation in Patient Care: Yes (Comment) Care giver support system in place?: Yes (comment) Current home services: DME(reports having a walker) Criminal Activity/Legal Involvement Pertinent to Current Situation/Hospitalization: No - Comment as needed  Activities of Daily Living Home Assistive Devices/Equipment: Blood pressure cuff, Cane (specify quad or straight), CBG Meter, Eyeglasses, Grab bars around toilet, Grab bars in shower, Hand-held shower hose, Scales, Shower chair with back, Walker (specify type) ADL Screening (condition at time of admission) Patient's cognitive ability adequate to safely complete daily activities?: Yes Is the patient deaf or have difficulty hearing?: No Does the patient have difficulty seeing, even when wearing glasses/contacts?: No Does the patient have difficulty concentrating, remembering, or making decisions?: Yes Patient able to express need for assistance with ADLs?: Yes Does the patient have difficulty dressing or bathing?: No Independently performs ADLs?: Yes (appropriate for developmental age) Does the patient have difficulty walking or climbing stairs?: Yes Weakness of Legs: Both Weakness of Arms/Hands: None  Permission Sought/Granted Permission sought to share information with : Family Supports Permission granted to share information with : No              Emotional Assessment Appearance:: Appears stated age Attitude/Demeanor/Rapport: Engaged Affect (typically observed): Accepting Orientation: : Oriented to Situation, Oriented to  Time, Oriented to Place, Oriented to  Self Alcohol / Substance Use: Not Applicable Psych Involvement: No (comment)  Admission diagnosis:  Hyperkalemia [E87.5] ESRD (end stage renal disease) ([place]) [N18.6] Chest pain, unspecified type [R07.9] Patient Active Problem List   Diagnosis Date Noted  . Atypical chest  pain 07/30/2018  . S/P CABG (coronary artery bypass graft) 04/01/2018  . Claudication ([place]) 04/01/2018  . Chronic diastolic (congestive) heart failure ([place]) 04/01/2018  . Paresthesia 12/07/2015  . BPH (benign prostatic hyperplasia)   . Atherosclerosis of native coronary artery of native heart without angina pectoris 10/01/2015  . Chronic systolic (congestive) heart failure ([place]) 10/01/2015  . Encounter for fitting or adjustment of implantable cardioverter-defibrillator (ICD)   . Ileus, postoperative ([place])   . Impaired nasal gastric feeding tube   . Impaired nasogastric feeding tube   . Ileus ([place])   . ESRD (end stage renal disease) ([place])   . Gallstones   . Gallbladder calculus 06/15/2015  . Numbness   . RUQ abdominal pain   . CHF exacerbation ([place]) 03/10/2015  . Heart failure, acute on chronic, systolic and diastolic ([place]) 85/02/7739  . Acute on chronic congestive heart failure ([place])   . Diabetes mellitus with complication ([place])   . CHF (congestive heart failure) ([place]) 08/03/2014  . Hypertensive urgency 08/03/2014  . Controlled type 2 diabetes with neuropathy ([place]) 08/03/2014  . Obesity (BMI 30-39.9) 08/03/2014  . Bilateral leg pain 08/03/2014  . Acute on chronic combined systolic and diastolic CHF (congestive heart failure) ([place]) 07/02/2014  . CKD (chronic kidney disease) stage 3, GFR 30-59 ml/min (HCC) 06/29/2014  . DM (diabetes mellitus), type 2 with renal complications ([place]) 28/78/6767  . 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 Essential hypertension, benign 06/29/2014  . Hepatic cirrhosis ([place]) 11/28/2013  . Cardiomyopathy, ischemic 11/28/2013  . Hepatitis C antibody test positive 11/28/2013  . Cirrhosis ([place])   . Type 2 diabetes mellitus with hyperglycemia ([place])   . Diabetes mellitus with hypoglycemia ([place]) 04/29/2013  . HCAP (healthcare-associated pneumonia) 04/28/2013  . DM type 2 (diabetes mellitus, type 2)  ([place]) 04/28/2013  . Hyperlipemia   . Ventricular fibrillation ([place]) 03/08/2013  . Seizure ([place]) 03/07/2013   PCP:  Patient, No Pcp Per Pharmacy:   [facility] [address] ([address]), [place] [address] [place] ([place]) [place] [address] Phone: [phone] Fax: [phone]     Social Determinants of Health (SDOH) Interventions    Readmission Risk Interventions No flowsheet data found.

## 2018-07-31 NOTE — Progress Notes (Signed)
  Patient found with small amount of bleeding from the nose. Bleeding stopped few minutes after ice packs was applied to nose. Patient resting in bed. Will continue to monitor.

## 2018-07-31 NOTE — Evaluation (Signed)
Occupational Therapy Evaluation Patient Details Name: Eric [name] MRN: [id number] DOB: 1955-06-21 Today's Date: 07/31/2018    History of Present Illness Patient is a 63 y/o male presenting to the ED on 07/30/2018 with primary complaints of chest tightness. 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 Admitted with Atypical chest pain with continued work-up.   Clinical Impression   Pt with decline in function and safety with ADLs and ADL mobility. Pt currently requires min guard A with LB ADLs, transfers and standing at sink for ADL/grooming tasks. Pt with some cognitive impairments tof STM, orientation and processing. Pt reports that he lives at home with his wife, daughter and son in law and that he was independent with ADLs and has a RW but does not use it. Pt would benefit from acute OT services to address impairments to maximize level of function and safety    Follow Up Recommendations  No OT follow up;Supervision/Assistance - 24 hour    Equipment Recommendations  None recommended by OT    Recommendations for Other Services       Precautions / Restrictions Precautions Precautions: Fall Precaution Comments: on supplemental O2 currently Restrictions Weight Bearing Restrictions: No      Mobility Bed Mobility               General bed mobility comments: up in recliner  Transfers Overall transfer level: Needs assistance Equipment used: Rolling walker (2 wheeled) Transfers: Sit to/from Stand Sit to Stand: Min guard         General transfer comment: for safety and initial standing balance    Balance Overall balance assessment: Needs assistance Sitting-balance support: No upper extremity supported;Feet supported Sitting balance-Leahy Scale: Good     Standing balance support: Bilateral upper extremity supported;During functional activity Standing balance-Leahy  Scale: Poor Standing balance comment: reliant on UE support                           ADL either performed or assessed with clinical judgement   ADL Overall ADL's : Needs assistance/impaired Eating/Feeding: Independent   Grooming: Wash/dry hands;Wash/dry face;Min guard;Standing   Upper Body Bathing: Set up;Sitting   Lower Body Bathing: Min guard;Cueing for safety;Sit to/from stand   Upper Body Dressing : Set up;Sitting   Lower Body Dressing: Min guard;Cueing for safety;Sit to/from stand Lower Body Dressing Details (indicate cue type and reason): pt with difficulty donning socks initialy, but was able to complete with compensatory technique verbal cues Toilet Transfer: Min guard;Ambulation;RW;Cueing for safety   Toileting- Clothing Manipulation and Hygiene: Supervision/safety;Sit to/from stand   Tub/ Shower Transfer: Min guard;Ambulation;Rolling walker;Grab bars   Functional mobility during ADLs: Min guard;Cueing for safety;Rolling walker       Vision Baseline Vision/History: Wears glasses Wears Glasses: Reading only       Perception     Praxis      Pertinent Vitals/Pain Pain Assessment: 0-10 Pain Score: 8  Faces Pain Scale: No hurt Pain Location: "all over" Pain Descriptors / Indicators: Aching Pain Intervention(s): Monitored during session;Repositioned     Hand Dominance Right   Extremity/Trunk Assessment Upper Extremity Assessment Upper Extremity Assessment: Overall WFL for tasks assessed   Lower Extremity Assessment Lower Extremity Assessment: Generalized weakness   Cervical / Trunk Assessment Cervical / Trunk Assessment: Normal   Communication Communication Communication: No difficulties   Cognition Arousal/Alertness:  Awake/alert Behavior During Therapy: WFL for tasks assessed/performed Overall Cognitive Status: No family/caregiver present to determine baseline cognitive functioning Area of Impairment: Orientation                  Orientation Level: Disoriented to;Time             General Comments: some difficulty with processing requiring increased time; unaware of safety precautions   General Comments  on 2L O2 SpO2 100%, with RA and mobility desat to 90% with HR of 83 bpm    Exercises     Shoulder Instructions      Home Living Family/patient expects to be discharged to:: Private residence Living Arrangements: Spouse/significant other;Children Available Help at Discharge: Family;Available 24 hours/day Type of Home: House Home Access: Stairs to enter [employer] of Steps: 3   Home Layout: One level     Bathroom Shower/Tub: Occupational psychologist: Standard     Home Equipment: Environmental consultant - 2 wheels;Grab bars - tub/shower;Shower seat          Prior Functioning/Environment Level of Independence: Independent  Gait / Transfers Assistance Needed: pt reports that he has a RW, but does not use it ADL's / Homemaking Assistance Needed: Pt reports wife just sets out clothnig for him            OT Problem List: Impaired balance (sitting and/or standing);Decreased cognition;Decreased coordination;Decreased activity tolerance;Decreased safety awareness      OT Treatment/Interventions: Self-care/ADL training;DME and/or AE instruction;Therapeutic activities;Patient/family education    OT Goals(Current goals can be found in the care plan section) Acute Rehab OT Goals Patient Stated Goal: return home soon Time For Goal Achievement: 08/07/18 Potential to Achieve Goals: Good ADL Goals Pt Will Perform Grooming: with supervision;with modified independence;standing Pt Will Perform Lower Body Bathing: with supervision;with set-up;with modified independence;sit to/from stand Pt Will Perform Lower Body Dressing: with supervision;with set-up;with modified independence;sit to/from stand Pt Will Transfer to Toilet: with supervision;with modified independence;ambulating Pt Will Perform  Toileting - Clothing Manipulation and hygiene: with modified independence;sit to/from stand;Independently Pt Will Perform Tub/Shower Transfer: with supervision;with modified independence;ambulating  OT Frequency: Min 2X/week   Barriers to D/C:    no barriers       Co-evaluation              AM-PAC OT "6 Clicks" Daily Activity     Outcome Measure Help from another person eating meals?: None Help from another person taking care of personal grooming?: A Little Help from another person toileting, which includes using toliet, bedpan, or urinal?: A Little Help from another person bathing (including washing, rinsing, drying)?: A Little Help from another person to put on and taking off regular upper body clothing?: None Help from another person to put on and taking off regular lower body clothing?: A Little 6 Click Score: 20   End of Session Equipment Utilized During Treatment: Gait belt;Rolling walker  Activity Tolerance: Patient tolerated treatment well Patient left: in chair;with call bell/phone within reach  OT Visit Diagnosis: Unsteadiness on feet (R26.81);Other abnormalities of gait and mobility (R26.89);Pain Pain - Right/Left: (generalized)                Time: 6503-5465 OT Time Calculation (min): 21 min Charges:  OT Evaluation $OT Eval Moderate Complexity: 1 Mod    Britt Bottom 07/31/2018, 12:15 PM

## 2018-07-31 NOTE — Progress Notes (Signed)
  Date: 07/31/2018  Patient name: Eric [name]  Medical record number: [id number]  Date of birth: January 21, 1956   I have seen and evaluated this patient and I have discussed the plan of care with the house staff. Please see their note for complete details. I concur with their findings with the following additions/corrections: Mr. Goldman was seen this afternoon with [facility].  Please note we will follow him shortly.  Mr. Petite was sitting in a chair and states that his symptoms have not changed.  He seemed most concerned about a dry cough that occurs mostly at night.  He wants to know when he will be able to go home.  He remains afebrile, BP 113/69, O2 sat 92% on room air, heart rate 70 HRRR systolic murmur anteriorly Lungs are clear to auscultation bilaterally with good air movement and without rhonchi Extremities trace edema in the distal legs bilaterally Feet are warm bilaterally Neuro he is alert and oriented except he named the year 2015 but got the month right.  He asked the same question a couple times.  Hemoglobin 9.5 - 8.6 today but this is less than his baseline  His ultrasound showed nodular contour of the liver consistent with cirrhosis, ascites.  Assessment and plan 1.  Intermittent chest pain with known CAD - his pain was atypical and he ruled out for ACS with EKGs and troponins.  He will can follow-up with his cardiologist, Dr. Einar Gip as an outpatient.  2.  Constant abdominal pain for months - etiology is not certain as lipase, history, and exam ruled out pancreatitis.  Retained  stone unlikely due to history and labs.  SBP unlikely as no leukocytosis, fever, and it has been present for months.  Chronic mesenteric ischemia unlikely as not worsened with eating and no weight loss.  Abdominal pain typically is not seen the services but he is also complaining of whole body pain and muscle cramps which can be seen in cirrhosis.  This can be followed up as an outpatient.  3.   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  He states he has a PCP and he can discuss referral to [facility] for antiviral treatment for his hepatitis.  He will also need a diagnostic paracentesis to characterize the ascitic fluid protein as if it is low, he would qualify for suppressive antibiotic therapy when hospitalized.  4.  Nocturnal cough - his chest x-ray was not consistent with pneumonia nor does he have a fever or leukocytosis  We spoke with the nephrology NP yesterday who stated his dry weight has not changed during outpatient dialysis sessions so I do not think this represents a volume overload unless he was losing muscle mass at the same time and is gaining fluid which seems highly unlikely.  He is COVID negative.  He is not on an ACE or an ARB.  To me, he has impressive abdominal girth which could be pressing on the lungs and possibly leading to cough.  If his cough persists, he will need further work-up.  We also discussed OSA as a possible etiology.  He has no reason to remain in the hospital although he has several unresolved issues that need attention in the outpatient setting.  He stated he had a PCP but did not know the name so our team is contacting his wife to get this information.   Bartholomew Crews, MD 07/31/2018, 3:45 PM

## 2018-07-31 NOTE — Plan of Care (Signed)
  Problem: Education: Goal: Individualized Educational Video(s) Outcome: Adequate for Discharge   Problem: Fluid Volume: Goal: Compliance with measures to maintain balanced fluid volume will improve Outcome: Adequate for Discharge   Problem: Health Behavior/Discharge Planning: Goal: Ability to manage health-related needs will improve Outcome: Adequate for Discharge   Problem: Clinical Measurements: Goal: Complications related to the disease process, condition or treatment will be avoided or minimized Outcome: Adequate for Discharge   Problem: Education: Goal: Knowledge of General Education information will improve Description: Including pain rating scale, medication(s)/side effects and non-pharmacologic comfort measures 07/31/2018 1720 by Baldo Ash, RN Outcome: Adequate for Discharge 07/31/2018 0828 by Baldo Ash, RN Outcome: Progressing   Problem: Health Behavior/Discharge Planning: Goal: Ability to manage health-related needs will improve Outcome: Adequate for Discharge   Problem: Clinical Measurements: Goal: Ability to maintain clinical measurements within normal limits will improve Outcome: Adequate for Discharge Goal: Will remain free from infection Outcome: Adequate for Discharge Goal: Diagnostic test results will improve Outcome: Adequate for Discharge Goal: Respiratory complications will improve Outcome: Adequate for Discharge Goal: Cardiovascular complication will be avoided Outcome: Adequate for Discharge   Problem: Activity: Goal: Risk for activity intolerance will decrease Outcome: Adequate for Discharge   Problem: Nutrition: Goal: Adequate nutrition will be maintained Outcome: Adequate for Discharge   Problem: Coping: Goal: Level of anxiety will decrease Outcome: Adequate for Discharge   Problem: Elimination: Goal: Will not experience complications related to bowel motility Outcome: Adequate for Discharge Goal: Will not experience  complications related to urinary retention Outcome: Adequate for Discharge   Problem: Pain Managment: Goal: General experience of comfort will improve Outcome: Adequate for Discharge   Problem: Safety: Goal: Ability to remain free from injury will improve Outcome: Adequate for Discharge   Problem: Skin Integrity: Goal: Risk for impaired skin integrity will decrease Outcome: Adequate for Discharge   Problem: Acute Rehab PT Goals(only PT should resolve) Goal: Pt Will Go Supine/Side To Sit Outcome: Adequate for Discharge Goal: Patient Will Transfer Sit To/From Stand Outcome: Adequate for Discharge Goal: Pt Will Transfer Bed To Chair/Chair To Bed Outcome: Adequate for Discharge Goal: Pt Will Ambulate Outcome: Adequate for Discharge Goal: Pt Will Go Up/Down Stairs Outcome: Adequate for Discharge   Problem: Acute Rehab OT Goals (only OT should resolve) Goal: Pt. Will Perform Grooming Outcome: Adequate for Discharge Goal: Pt. Will Perform Lower Body Bathing Outcome: Adequate for Discharge Goal: Pt. Will Perform Lower Body Dressing Outcome: Adequate for Discharge Goal: Pt. Will Transfer To Toilet Outcome: Adequate for Discharge Goal: Pt. Will Perform Toileting-Clothing Manipulation Outcome: Adequate for Discharge Goal: Pt. Will Perform Tub/Shower Transfer Outcome: Adequate for Discharge

## 2018-07-31 NOTE — Discharge Summary (Signed)
Name: Eric [name] MRN: [id number] DOB: [birth year] 63 y.o. PCP: Patient, No Pcp Per  Date of Admission: 07/30/2018  8:32 AM Date of Discharge: 07/31/2018 Attending Physician: Bartholomew Crews, MD  Discharge Diagnosis: 1. Atypical chest pain  2. 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 Cough 4. ESRD    Discharge Medications: Allergies as of 07/31/2018      Reactions   Gabapentin Diarrhea   diarhea      Medication List    TAKE these medications   acetaminophen 325 MG tablet Commonly known as: TYLENOL Take 2 tablets (650 mg total) by mouth every 6 (six) hours as needed for mild pain or moderate pain.   amLODipine 10 MG tablet Commonly known as: NORVASC Take 10 mg by mouth daily.   aspirin EC 81 MG tablet Take 81 mg by mouth daily.   benzonatate 100 MG capsule Commonly known as: TESSALON Take 1 capsule (100 mg total) by mouth 3 (three) times daily as needed for cough.   calcium acetate 667 MG capsule Commonly known as: PHOSLO Take 667 mg by mouth 3 (three) times daily with meals.   cinacalcet 30 MG tablet Commonly known as: SENSIPAR Take 30 mg by mouth daily with supper.   furosemide 80 MG tablet Commonly known as: LASIX Take 1 tablet (80 mg total) by mouth every morning. Do not take on days of dialysis   gabapentin 300 MG capsule Commonly known as: Neurontin Take 1 capsule (300 mg total) by mouth 2 (two) times daily.   hydrALAZINE 10 MG tablet Commonly known as: APRESOLINE Take 10 mg by mouth daily as needed (anxiety).   isosorbide mononitrate 60 MG 24 hr tablet Commonly known as: IMDUR Take 60 mg by mouth as needed (Heart Rate).   metoprolol tartrate 25 MG tablet Commonly known as: LOPRESSOR Take 1 tablet (25 mg total) by mouth 2 (two) times daily.   multivitamin Tabs tablet Take 1 tablet by mouth at bedtime.   pantoprazole 40 MG tablet Commonly known as: PROTONIX Take 40 mg by mouth as needed (acid reflux).   rosuvastatin 20 MG tablet Commonly  known as: CRESTOR Take 1 tablet (20 mg total) by mouth daily at 6 PM.       Disposition and follow-up:   [name] was discharged from [facility] in Stable condition.  At the hospital follow up visit please address:  1.  Please assess for ongoing chest pain and associated symptoms. Ensure he has follow up with cardiology. He will need follow up with GI for screening EGD due to history of liver cirrhosis. In addition, would benefit from diagnostic paracentesis. We also recommend follow up with ID for HepC treatment. Recommend a sleep study to check for OSA for further evaluation of chronic, dry cough.    2.  Labs / imaging needed at time of follow-up: None   3.  Pending labs/ test needing follow-up: None   Follow-up Appointments: Team spoke with patient's wife. She is arranging PCP follow up for patient. He declined assistance from case management.    Hospital Course by problem list:  1. Atypical chest pain: Patient reported months of intermittent sternal discomfort. His troponins were negative and EKG did not show signs of acute ischemia. Hefollows-up regularly with his cardiologist with his most recent appointment in June. Recommend continued follow-up outpatient at this time.  2. 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 This was noted back in 2015 with no indication  of follow-up found when reviewing patient chart. Evaluation with [place] notable for nodular contour of the liver with no definite focal liver lesion identified.We recommend outpatient follow-up with GI and ID for further evaluation and management of his cirrhosis secondary to Hep C.  3. Chronic Abdominal Pain: Etiology unclear at this time. Given patient's underlying history of cirrhosis,hiscurrent complaints of abdominal pain and discomfort may be associated with the presence of ascites as demonstrated by abdominal US. He remained afebrile with no  leukocytosis makingSBP less likely.He would benefit from outpatient follow-up with GI to undergo diagnostic paracentesis for analysis of ascitic fluid and assess need for antibiotic intervention.  4. Dry Cough: Complaints of persistent dry, nocturnal cough. No signs or symptoms of infection during admission. CXR unremarkable. COVID negative. Low suspicion for volume overload as he appeared euvolemic on exam and no significant weight changes per his HD team. OSA is another potential etiology for his cough as he does have significant abdominal girth.  5. ESRD: Patient received HD during this admission. He will return to his usual HD schedule at discharge.    Discharge Vitals:   BP 113/69 (BP Location: Right Arm)   Pulse 70   Temp 97.6 F (36.4 C) (Oral)   Resp 18   Ht 5\' 6"  (1.676 m)   Wt 82.5 kg   SpO2 92% Comment: laying in bed   BMI 29.36 kg/m   Pertinent Labs, Studies, and Procedures:   CBC Latest Ref Rng & Units 07/31/2018 07/30/2018 01/05/2018  WBC 4.0 - 10.5 K/uL 3.5(L) 4.6 4.4  Hemoglobin 13.0 - 17.0 g/dL 8.6(L) 9.5(L) 10.2(L)  Hematocrit 39.0 - 52.0 % 27.9(L) 31.8(L) 31.7(L)  Platelets 150 - 400 K/uL 47(L) 53(L) 115(L)   BMP Latest Ref Rng & Units 07/31/2018 07/30/2018 01/05/2018  Glucose 70 - 99 mg/dL 75 140(H) 92  BUN 8 - 23 mg/dL 35(H) 55(H) 42(H)  Creatinine 0.61 - 1.24 mg/dL 6.86(H) 9.64(H) 7.57(H)  Sodium 135 - 145 mmol/L 131(L) 135 128(L)  Potassium 3.5 - 5.1 mmol/L 4.7 5.2(H) 4.7  Chloride 98 - 111 mmol/L 93(L) 94(L) 90(L)  CO2 22 - 32 mmol/L 27 26 25   Calcium 8.9 - 10.3 mg/dL 8.2(L) 9.4 8.4(L)   Abdominal US: IMPRESSION: Nodular contour of the liver which can be seen in cirrhosis of liver. No definite focal liver lesion is identified. Prior cholecystectomy. Left kidney is not visualized due to patient body habitus.  CXR: FINDINGS: There is cardiomegaly with pulmonary vascularity within normal limits. Pacemaker lead is attached to the right ventricle. No  evident edema or consolidation. Patient is status post median sternotomy. No adenopathy. No bone lesions.  Discharge Instructions: Discharge Instructions    Call MD for:  difficulty breathing, headache or visual disturbances   Complete by: As directed    Call MD for:  extreme fatigue   Complete by: As directed    Call MD for:  persistant dizziness or light-headedness   Complete by: As directed    Diet - low sodium heart healthy   Complete by: As directed    Discharge instructions   Complete by: As directed    You were admitted to the hospital for chest pain. Based on the test we did, the pain is not coming from your heart. This is a good thing! It may be coming from your muscles. We prescribed you tessalon pearls for your cough as this may contributing to your chest pain.   You also have cirrhosis of your liver, which means is scarred  down and not doing its job. There is no cure for this. The bloating you are experiencing is due to problems with your liver. This fluid can be removed. We recommend you follow up with a liver specialist to have this done. We placed a referral for this.   The damage to your liver was caused by a virus, Hepatitis C. We reviewed your records and it does not look like you have received treatment for this. We will refer you to infectious diseases to get this treated.   We hope you continue to do well in the future. Call us if you have any questions or concerns.    -Dr.Santos   Increase activity slowly   Complete by: As directed       Signed: Welford Roche, MD 07/31/2018, 5:39 PM   Pager: [phone]

## 2018-07-31 NOTE — Evaluation (Signed)
Physical Therapy Evaluation Patient Details Name: Eric [name] MRN: [id number] DOB: Nov 25, 1955 Today's Date: 07/31/2018   History of Present Illness  Patient is a 63 y/o male presenting to the ED on 07/30/2018 with primary complaints of chest tightness. 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 Admitted with Atypical chest pain with continued work-up.    Clinical Impression  Patient admitted with the above listed diagnosis. Patient reports a sedentary lifestyle at baseline and did not require use of AD. Patient today requiring up to [place] for steadying with use of RW during gait. SpO2 on RA with mobility - patient desat to 90% - supplemental O2 reapplied with quick rebound. Will recommend HHPT at discharge as well as continued use of RW at home for improved safety and stability. PT to continue to follow acutely.      Follow Up Recommendations Home health PT;Supervision/Assistance - 24 hour    Equipment Recommendations  None recommended by PT    Recommendations for Other Services       Precautions / Restrictions Precautions Precautions: Fall Precaution Comments: on supplemental O2 currently Restrictions Weight Bearing Restrictions: No      Mobility  Bed Mobility               General bed mobility comments: up in recliner  Transfers Overall transfer level: Needs assistance Equipment used: Rolling walker (2 wheeled) Transfers: Sit to/from Stand Sit to Stand: Min guard         General transfer comment: for safety and initial standing balance  Ambulation/Gait Ambulation/Gait assistance: Min guard;Min assist Gait Distance (Feet): 150 Feet Assistive device: Rolling walker (2 wheeled) Gait Pattern/deviations: Step-through pattern;Decreased stride length;Trunk flexed Gait velocity: decreased   General Gait Details: patient reporting fatigue limiting - reports a  sedentary lifestyle; mild instability requiring up to [place]            Wheelchair Mobility    Modified Rankin (Stroke Patients Only)       Balance Overall balance assessment: Needs assistance Sitting-balance support: No upper extremity supported;Feet supported Sitting balance-Leahy Scale: Good     Standing balance support: Bilateral upper extremity supported;During functional activity Standing balance-Leahy Scale: Poor Standing balance comment: reliant on UE support                             Pertinent Vitals/Pain Pain Assessment: Faces Pain Score: 8  Faces Pain Scale: No hurt Pain Descriptors / Indicators: Aching Pain Intervention(s): Monitored during session    Home Living Family/patient expects to be discharged to:: Private residence Living Arrangements: Spouse/significant other;Children Available Help at Discharge: Family;Available 24 hours/day Type of Home: House Home Access: Stairs to enter   [employer] of Steps: 3 Home Layout: One level Home Equipment: Walker - 2 wheels;Grab bars - tub/shower;Shower seat      Prior Function Level of Independence: Independent   Gait / Transfers Assistance Needed: pt reports that he has a RW, but does not use it  ADL's / Homemaking Assistance Needed: Pt reports wife just set out clothnig for him        Hand Dominance   Dominant Hand: Right    Extremity/Trunk Assessment   Upper Extremity Assessment Upper Extremity Assessment: Defer to OT evaluation    Lower Extremity Assessment Lower Extremity Assessment: Generalized weakness  Cervical / Trunk Assessment Cervical / Trunk Assessment: Normal  Communication   Communication: No difficulties  Cognition Arousal/Alertness: Awake/alert Behavior During Therapy: WFL for tasks assessed/performed Overall Cognitive Status: No family/caregiver present to determine baseline cognitive functioning Area of Impairment: Orientation                  Orientation Level: Disoriented to;Time             General Comments: some difficulty with processing requiring increased time; unaware of safety precautions      General Comments General comments (skin integrity, edema, etc.): on 2L O2 SpO2 100%, with RA and mobility desat to 90% with HR of 83 bpm    Exercises     Assessment/Plan    PT Assessment Patient needs continued PT services  PT Problem List Decreased strength;Decreased activity tolerance;Decreased balance;Decreased mobility;Decreased knowledge of use of DME;Decreased safety awareness       PT Treatment Interventions DME instruction;Gait training;Stair training;Functional mobility training;Therapeutic activities;Therapeutic exercise;Balance training;Patient/family education    PT Goals (Current goals can be found in the Care Plan section)  Acute Rehab PT Goals Patient Stated Goal: return home soon PT Goal Formulation: With patient Time For Goal Achievement: 08/14/18 Potential to Achieve Goals: Good    Frequency Min 3X/week   Barriers to discharge        Co-evaluation               AM-PAC PT "6 Clicks" Mobility  Outcome Measure Help needed turning from your back to your side while in a flat bed without using bedrails?: A Little Help needed moving from lying on your back to sitting on the side of a flat bed without using bedrails?: A Little Help needed moving to and from a bed to a chair (including a wheelchair)?: A Little Help needed standing up from a chair using your arms (e.g., wheelchair or bedside chair)?: A Little Help needed to walk in hospital room?: A Little Help needed climbing 3-5 steps with a railing? : A Lot 6 Click Score: 17    End of Session Equipment Utilized During Treatment: Gait belt;Oxygen Activity Tolerance: Patient tolerated treatment well;Patient limited by fatigue Patient left: in chair;with call bell/phone within reach Nurse Communication: Mobility  status PT Visit Diagnosis: Unsteadiness on feet (R26.81);Other abnormalities of gait and mobility (R26.89);Muscle weakness (generalized) (M62.81)    Time: 7628-3151 PT Time Calculation (min) (ACUTE ONLY): 15 min   Charges:   PT Evaluation $PT Eval Moderate Complexity: 1 Mod          Lanney Gins, PT, DPT Supplemental Physical Therapist 07/31/18 11:02 AM Pager: [phone] Office: [phone]

## 2018-07-31 NOTE — Progress Notes (Signed)
Subjective:  Today patient reports there are no significant changes in his chest and abdominal discomfort. Although his symptoms are unchanged, he feels as though he is doing fine and asked when he would be able to go home. When asked about fullness and abdominal swelling he reports that some days his stomach feels larger and other days it feels smaller. He has not had any changes in his appetite or noticed any weight changes. He still has complaints of a dry cough that has been present for months. His cough is worse when laying down in bed. Denies seasonal allergies or other potential cough triggers. At home he reports having headaches after waking up in the morning as well as consistent daytime sleepiness. When discussing current management with patient, he reports that he was unaware of his underlying cirrhosis which is documented as far back as 2015. He endorses understanding of his ESRD and reports that he is still able to make urine and has no problems attending dialysis 3 times a week. Patient reports that he does not have a PCP but follows up with his cardiologist regularly.  Objective:  Vital signs in last 24 hours: Vitals:   07/30/18 1719 07/30/18 1804 07/30/18 2116 07/31/18 0420  BP: (!) 102/57 (!) 128/91 107/62 107/69  Pulse: 69 70 68 68  Resp: 18 20 18 19   Temp: 98 F (36.7 C) 98.3 F (36.8 C) 97.9 F (36.6 C) 98.5 F (36.9 C)  TempSrc: Oral Oral Oral Oral  SpO2: 98% 98% 96% 92%  Weight: 82.5 kg  82.5 kg   Height:       Weight change:   Intake/Output Summary (Last 24 hours) at 07/31/2018 1029 Last data filed at 07/31/2018 0600 Gross per 24 hour  Intake 390 ml  Output 1281 ml  Net -891 ml    Ref Range & Units 05:28 (07/31/18) 1d ago (07/30/18) 78mo ago (01/05/18) 58mo ago (01/04/18)  WBC 4.0 - 10.5 K/uL 3.5Low   4.6  4.4  6.4   RBC 4.22 - 5.81 MIL/uL 2.67Low   2.95Low   3.37Low   3.48Low    Hemoglobin 13.0 - 17.0 g/dL 8.6Low   9.5Low   10.2Low   10.3Low    HCT 39.0 - 52.0  % 27.9Low   31.8Low   31.7Low   32.4Low    MCV 80.0 - 100.0 fL 104.5High   107.8High   94.1  93.1   MCH 26.0 - 34.0 pg 32.2  32.2  30.3  29.6   MCHC 30.0 - 36.0 g/dL 30.8  29.9Low   32.2  31.8   RDW 11.5 - 15.5 % 16.8High   16.7High   14.5  14.3   Platelets 150 - 400 K/uL 47Low   53Low  CM  115Low  CM  148Low       Ref Range & Units 05:28 (07/31/18) 1d ago (07/30/18) 63mo ago (01/05/18) 93mo ago (01/04/18)  Sodium 135 - 145 mmol/L 131Low   135  128Low   126Low    Potassium 3.5 - 5.1 mmol/L 4.7  5.2High   4.7  5.5High    Chloride 98 - 111 mmol/L 93Low   94Low   90Low   87Low    CO2 22 - 32 mmol/L 27  26  25  22    Glucose, Bld 70 - 99 mg/dL 75  140High   92  60Low    BUN 8 - 23 mg/dL 35High   55High   42High   74High  Creatinine, Ser 0.61 - 1.24 mg/dL 6.86High   9.64High   7.57High   10.47High    Calcium 8.9 - 10.3 mg/dL 8.2Low   9.4  8.4Low   8.4Low    GFR calc non Af Amer >60 mL/min 8Low   5Low   7Low   5Low    GFR calc Af Amer >60 mL/min 9Low   6Low   8Low   5Low    Anion gap 5 - 15 11  15  CM  13 CM  17High  CM      Ref Range & Units 1d ago (07/30/18) 1d ago (07/30/18) 1d ago (07/30/18) 1d ago (07/30/18)  Troponin I (High Sensitivity) <18 ng/L 27High   25High  CM  24High  CM  28High  CM     US Abdomen 07/31/18 IMPRESSION: Nodular contour of the liver which can be seen in cirrhosis of liver. No definite focal liver lesion is identified.  Prior cholecystectomy.  Left kidney is not visualized due to patient body habitus.  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  Active Problems:   Atypical chest pain  Intermittent Chest Pain: Current intermittent sternal discomfort without radiation has been present for the past few months. Chronicity of symptoms as well as high sensitivity troponins  and EKG results are less concerning for development of ACS. Follows-up regularly with his cardiologist with his most recent appointment in June. Recommend continued follow-up outpatient at this time.  Chronic Abdominal Pain: Given patient's underlying history of cirrhosis, his current complaints of abdominal pain and discomfort may be associated with the presence of ascites as demonstrated on abdominal US today. He has remained afebrile with no leukocytosis making infection less likely. He current lab work and presentation is less concerning for pancreatitis. He also has not had any weight fluctuations, appetite changes, or postprandial abdominal pain which decreases the likelihood of chronic mesenteric ischemia being a potential etiology of his current symptoms. He would benefit from outpatient follow-up with GI to assess need for paracentesis to help alleviate symptoms.  Cirrhosis secondary to chronic Hep C: Ultrasound today with nodular contour of the liver consistent with cirrhosis. No focal lesions identified. We recommend outpatient follow-up with GI and ID for further evaluation and management.  ESRD: On hemodialysis 3 days a week without issue.   LOS: 0 days   Luna Fuse, Medical Student 07/31/2018, 10:29 AM

## 2018-08-01 NOTE — Discharge Summary (Addendum)
Name: Eric [name] MRN: [id number] DOB: February 23, 1955 63 y.o. PCP: Patient, No Pcp Per  Date of Admission: 07/30/2018  8:32 AM Date of Discharge: 07/31/2018 Attending Physician: No att. providers found  Discharge Diagnosis: 1. Atypical chest pain  2. 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 Cough 4. ESRD    Discharge Medications: Allergies as of 07/31/2018      Reactions   Gabapentin Diarrhea   diarhea      Medication List    TAKE these medications   acetaminophen 325 MG tablet Commonly known as: TYLENOL Take 2 tablets (650 mg total) by mouth every 6 (six) hours as needed for mild pain or moderate pain.   amLODipine 10 MG tablet Commonly known as: NORVASC Take 10 mg by mouth daily.   aspirin EC 81 MG tablet Take 81 mg by mouth daily.   benzonatate 100 MG capsule Commonly known as: TESSALON Take 1 capsule (100 mg total) by mouth 3 (three) times daily as needed for cough.   calcium acetate 667 MG capsule Commonly known as: PHOSLO Take 667 mg by mouth 3 (three) times daily with meals.   cinacalcet 30 MG tablet Commonly known as: SENSIPAR Take 30 mg by mouth daily with supper.   furosemide 80 MG tablet Commonly known as: LASIX Take 1 tablet (80 mg total) by mouth every morning. Do not take on days of dialysis   gabapentin 300 MG capsule Commonly known as: Neurontin Take 1 capsule (300 mg total) by mouth 2 (two) times daily.   hydrALAZINE 10 MG tablet Commonly known as: APRESOLINE Take 10 mg by mouth daily as needed (anxiety).   isosorbide mononitrate 60 MG 24 hr tablet Commonly known as: IMDUR Take 60 mg by mouth as needed (Heart Rate).   metoprolol tartrate 25 MG tablet Commonly known as: LOPRESSOR Take 1 tablet (25 mg total) by mouth 2 (two) times daily.   multivitamin Tabs tablet Take 1 tablet by mouth at bedtime.   pantoprazole 40 MG tablet Commonly known as: PROTONIX Take 40 mg by mouth as needed (acid reflux).   rosuvastatin 20 MG tablet Commonly  known as: CRESTOR Take 1 tablet (20 mg total) by mouth daily at 6 PM.       Disposition and follow-up:   [name] was discharged from [facility] in Stable condition.  At the hospital follow up visit please address:  1.  Please assess for ongoing chest pain and associated symptoms. Ensure he has follow up with cardiology, GI, and ID. Recommend referral to IR for paracentesis to help with GI symptoms. We also recommend a sleep study to check for OSA for further evaluation of chronic cough.    2.  Labs / imaging needed at time of follow-up: None   3.  Pending labs/ test needing follow-up: None   Follow-up Appointments:  Hospital Course by problem list:  1. Atypical chest pain: On admission, patient with complaints of non-radiating intermittent sternal discomfort without radiation present for the past few months. Chronicity of symptoms as well as high sensitivity troponins and EKG results were less concerning for development of ACS.Follows-up regularly with his cardiologist with his most recent appointment in June. Recommend continued follow-up outpatient at this time.  2. 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 Diagnosis was noted back in 2015 with no indication of follow-up found when reviewing patient chart. Evaluation with [place] notable for nodular contour of the liver with no definite focal  liver lesion identified.We recommend outpatient follow-up with GI and ID for further evaluation and management of his cirrhosis and Hep C.  3. Chronic Abdominal Pain: Etiology unclear at this time. Given patient's underlying history of cirrhosis,hiscurrent complaints of abdominal pain and discomfort may be associated with the presence of ascites as demonstrated on abdominal US. He remained afebrile with no leukocytosis makingSBP less likely. Lack of concerning physical exam findings and normal lipase also  did not support the presence of pancreatitis. He has not had any weight fluctuations, appetite changes, or postprandial abdominal pain which would be symptoms associated with chronic mesenteric ischemia. He would benefit from outpatient follow-up with GI to undergo diagnostic paracentesis for analysis of ascitic fluid and assess need for antibiotic intervention.  4. Dry Cough: Complaints of persistent dry cough that worsens when lying in bed. Work-up with chest x-ray was not consistent with pneumonia and he has remained afebrile without leukocytosis. He is COVID negative and is not on an ACE or an ARB. As reported by the nephrology NP, there has been no significant changes in his dry weight during his outpatient dialysis sessions. This along with the lack of edema on CXR make fluid overload a less likely cause of his symptoms. OSA is another potential etiology for his cough as he does have significant abdominal girth. If symptoms persist he will likely need outpatient work-up with sleep study.  5. ESRD: Patient received HD during this admission. He will return to his usual HD schedule at discharge.   Discharge Vitals:   BP 113/69 (BP Location: Right Arm)   Pulse 70   Temp 97.6 F (36.4 C) (Oral)   Resp 18   Ht 5\' 6"  (1.676 m)   Wt 82.5 kg   SpO2 92% Comment: laying in bed   BMI 29.36 kg/m   Pertinent Labs, Studies, and Procedures:  CBC Latest Ref Rng & Units 07/31/2018 07/30/2018 01/05/2018  WBC 4.0 - 10.5 K/uL 3.5(L) 4.6 4.4  Hemoglobin 13.0 - 17.0 g/dL 8.6(L) 9.5(L) 10.2(L)  Hematocrit 39.0 - 52.0 % 27.9(L) 31.8(L) 31.7(L)  Platelets 150 - 400 K/uL 47(L) 53(L) 115(L)   BMP Latest Ref Rng & Units 07/31/2018 07/30/2018 01/05/2018  Glucose 70 - 99 mg/dL 75 140(H) 92  BUN 8 - 23 mg/dL 35(H) 55(H) 42(H)  Creatinine 0.61 - 1.24 mg/dL 6.86(H) 9.64(H) 7.57(H)  Sodium 135 - 145 mmol/L 131(L) 135 128(L)  Potassium 3.5 - 5.1 mmol/L 4.7 5.2(H) 4.7  Chloride 98 - 111 mmol/L 93(L) 94(L) 90(L)  CO2 22 -  32 mmol/L 27 26 25   Calcium 8.9 - 10.3 mg/dL 8.2(L) 9.4 8.4(L)   Abdominal US: IMPRESSION: Nodular contour of the liver which can be seen in cirrhosis of liver. No definite focal liver lesion is identified. Prior cholecystectomy. Left kidney is not visualized due to patient body habitus.  CXR: FINDINGS: There is cardiomegaly with pulmonary vascularity within normal limits. Pacemaker lead is attached to the right ventricle. No evident edema or consolidation. Patient is status post median sternotomy. No adenopathy. No bone lesions.   Discharge Instructions: Discharge Instructions    Call MD for:  difficulty breathing, headache or visual disturbances   Complete by: As directed    Call MD for:  extreme fatigue   Complete by: As directed    Call MD for:  persistant dizziness or light-headedness   Complete by: As directed    Diet - low sodium heart healthy   Complete by: As directed    Discharge instructions  Complete by: As directed    You were admitted to the hospital for chest pain. Based on the test we did, the pain is not coming from your heart. This is a good thing! It may be coming from your muscles. We prescribed you tessalon pearls for your cough as this may contributing to your chest pain.   You also have cirrhosis of your liver, which means is scarred down and not doing its job. There is no cure for this. The bloating you are experiencing is due to problems with your liver. This fluid can be removed. We recommend you follow up with a liver specialist to have this done. We placed a referral for this.   The damage to your liver was caused by a virus, Hepatitis C. We reviewed your records and it does not look like you have received treatment for this. We will refer you to infectious diseases to get this treated.   We hope you continue to do well in the future. Call us if you have any questions or concerns.    -Dr.Santos   Increase activity slowly   Complete by: As directed        Signed: Welford Roche, MD  Internal Medicine PGY-3 P [phone]

## 2018-08-03 ENCOUNTER — Emergency Department (HOSPITAL_COMMUNITY): Payer: Medicare Other

## 2018-08-03 ENCOUNTER — Emergency Department (HOSPITAL_COMMUNITY)
Admission: EM | Admit: 2018-08-03 | Discharge: 2018-08-03 | Disposition: A | Discharge status: Home / Self Care | Payer: Medicare Other | Attending: Emergency Medicine | Admitting: Emergency Medicine

## 2018-08-03 ENCOUNTER — Other Ambulatory Visit: Payer: Self-pay

## 2018-08-03 ENCOUNTER — Encounter (HOSPITAL_COMMUNITY): Payer: Self-pay | Admitting: Emergency Medicine

## 2018-08-03 DIAGNOSIS — N186 End stage renal disease: Secondary | ICD-10-CM | POA: Diagnosis not present

## 2018-08-03 DIAGNOSIS — Z7982 Long term (current) use of aspirin: Secondary | ICD-10-CM | POA: Insufficient documentation

## 2018-08-03 DIAGNOSIS — I132 Hypertensive heart and chronic kidney disease with heart failure and with stage 5 chronic kidney disease, or end stage renal disease: Secondary | ICD-10-CM | POA: Insufficient documentation

## 2018-08-03 DIAGNOSIS — R52 Pain, unspecified: Secondary | ICD-10-CM

## 2018-08-03 DIAGNOSIS — Z79899 Other long term (current) drug therapy: Secondary | ICD-10-CM | POA: Insufficient documentation

## 2018-08-03 DIAGNOSIS — M6281 Muscle weakness (generalized): Secondary | ICD-10-CM | POA: Diagnosis not present

## 2018-08-03 DIAGNOSIS — E119 Type 2 diabetes mellitus without complications: Secondary | ICD-10-CM | POA: Diagnosis not present

## 2018-08-03 DIAGNOSIS — R531 Weakness: Secondary | ICD-10-CM

## 2018-08-03 DIAGNOSIS — I509 Heart failure, unspecified: Secondary | ICD-10-CM | POA: Insufficient documentation

## 2018-08-03 DIAGNOSIS — Z992 Dependence on renal dialysis: Secondary | ICD-10-CM | POA: Diagnosis not present

## 2018-08-03 DIAGNOSIS — Z87891 Personal history of nicotine dependence: Secondary | ICD-10-CM | POA: Diagnosis not present

## 2018-08-03 DIAGNOSIS — R69 Illness, unspecified: Secondary | ICD-10-CM

## 2018-08-03 LAB — PROTIME-INR
INR: 1.7 — ABNORMAL HIGH (ref 0.8–1.2)
Prothrombin Time: 19.5 seconds — ABNORMAL HIGH (ref 11.4–15.2)

## 2018-08-03 LAB — COMPREHENSIVE METABOLIC PANEL
ALT: 15 U/L (ref 0–44)
AST: 16 U/L (ref 15–41)
Albumin: 3.3 g/dL — ABNORMAL LOW (ref 3.5–5.0)
Alkaline Phosphatase: 53 U/L (ref 38–126)
Anion gap: 15 (ref 5–15)
BUN: 35 mg/dL — ABNORMAL HIGH (ref 8–23)
CO2: 26 mmol/L (ref 22–32)
Calcium: 8.3 mg/dL — ABNORMAL LOW (ref 8.9–10.3)
Chloride: 94 mmol/L — ABNORMAL LOW (ref 98–111)
Creatinine, Ser: 5.99 mg/dL — ABNORMAL HIGH (ref 0.61–1.24)
GFR calc Af Amer: 11 mL/min — ABNORMAL LOW (ref >60)
GFR calc non Af Amer: 9 mL/min — ABNORMAL LOW (ref >60)
Glucose, Bld: 92 mg/dL (ref 70–99)
Potassium: 3.8 mmol/L (ref 3.5–5.1)
Sodium: 135 mmol/L (ref 135–145)
Total Bilirubin: 0.7 mg/dL (ref 0.3–1.2)
Total Protein: 8.1 g/dL (ref 6.5–8.1)

## 2018-08-03 LAB — CBC WITH DIFFERENTIAL/PLATELET
Abs Immature Granulocytes: 0.02 10*3/uL (ref 0.00–0.07)
Basophils Absolute: 0 10*3/uL (ref 0.0–0.1)
Basophils Relative: 1 %
Eosinophils Absolute: 0.1 10*3/uL (ref 0.0–0.5)
Eosinophils Relative: 1 %
HCT: 27.9 % — ABNORMAL LOW (ref 39.0–52.0)
Hemoglobin: 8.5 g/dL — ABNORMAL LOW (ref 13.0–17.0)
Immature Granulocytes: 1 %
Lymphocytes Relative: 21 %
Lymphs Abs: 0.8 10*3/uL (ref 0.7–4.0)
MCH: 31.7 pg (ref 26.0–34.0)
MCHC: 30.5 g/dL (ref 30.0–36.0)
MCV: 104.1 fL — ABNORMAL HIGH (ref 80.0–100.0)
Monocytes Absolute: 0.3 10*3/uL (ref 0.1–1.0)
Monocytes Relative: 9 %
Neutro Abs: 2.4 10*3/uL (ref 1.7–7.7)
Neutrophils Relative %: 67 %
Platelets: 48 10*3/uL — ABNORMAL LOW (ref 150–400)
RBC: 2.68 MIL/uL — ABNORMAL LOW (ref 4.22–5.81)
RDW: 16.4 % — ABNORMAL HIGH (ref 11.5–15.5)
WBC: 3.6 10*3/uL — ABNORMAL LOW (ref 4.0–10.5)
nRBC: 0 % (ref 0.0–0.2)

## 2018-08-03 LAB — CK: Total CK: 161 U/L (ref 49–397)

## 2018-08-03 LAB — POCT I-STAT EG7
Acid-Base Excess: 10 mmol/L — ABNORMAL HIGH (ref 0.0–2.0)
Bicarbonate: 31.5 mmol/L — ABNORMAL HIGH (ref 20.0–28.0)
Calcium, Ion: 0.85 mmol/L — CL (ref 1.15–1.40)
HCT: 29 % — ABNORMAL LOW (ref 39.0–52.0)
Hemoglobin: 9.9 g/dL — ABNORMAL LOW (ref 13.0–17.0)
O2 Saturation: 99 %
Potassium: 3.8 mmol/L (ref 3.5–5.1)
Sodium: 136 mmol/L (ref 135–145)
TCO2: 32 mmol/L (ref 22–32)
pCO2, Ven: 31.4 mmHg — ABNORMAL LOW (ref 44.0–60.0)
pH, Ven: 7.609 (ref 7.250–7.430)
pO2, Ven: 116 mmHg — ABNORMAL HIGH (ref 32.0–45.0)

## 2018-08-03 LAB — MAGNESIUM: Magnesium: 2.2 mg/dL (ref 1.7–2.4)

## 2018-08-03 LAB — LACTIC ACID, PLASMA: Lactic Acid, Venous: 1.3 mmol/L (ref 0.5–1.9)

## 2018-08-03 LAB — LIPASE, BLOOD: Lipase: 31 U/L (ref 11–51)

## 2018-08-03 LAB — TROPONIN I (HIGH SENSITIVITY): Troponin I (High Sensitivity): 29 ng/L — ABNORMAL HIGH (ref <18)

## 2018-08-03 LAB — PHOSPHORUS: Phosphorus: 3.7 mg/dL (ref 2.5–4.6)

## 2018-08-03 LAB — ETHANOL: Alcohol, Ethyl (B): <10 mg/dL (ref <10)

## 2018-08-03 NOTE — ED Notes (Signed)
Pt discharged with all belongings. Discharge instructions reviewed with pt, and pt verbalized understanding. Opportunity for questions provided.  

## 2018-08-03 NOTE — ED Triage Notes (Signed)
Pt BIB [facility] from dialysis. Pt states he is having generalized pain. Pt was seen here a week ago for the same thing and states that is has been constant since being discharged. Pt received 3/4 dialysis treatment this morning.

## 2018-08-03 NOTE — Discharge Instructions (Addendum)
1.  At this time, I suspect your weakness is due to changes that occur in your fluids and electrolytes during dialysis.  Vital signs are now stable and your lab work is unchanged from previous findings. 2.  It is extremely important that you go to follow-up appointments with your nephrologist and other providing doctors including a gastroenterologist as planned after your hospitalization. 3.  Measure your temperature at home.  Measure your blood pressure and keep a log.  Reviewed these with your doctor within the next 2 days. 4.  You have some anemia which is often present with dialysis and kidney failure.  At this time, you are not at a level that would be appropriate for blood transfusion.  Continue to work with your kidney doctor for monitoring and medications that could help elevate your blood count.  This might increase your energy level and make you feel better.

## 2018-08-03 NOTE — ED Provider Notes (Signed)
[facility] EMERGENCY DEPARTMENT Provider Note   CSN: [id number] Arrival date & time: 08/03/18  1937     History   Chief Complaint Chief Complaint  Patient presents with   Chest Pain   Abdominal Pain    HPI Eric [name] is a 63 y.o. male.     HPI Patient reports that he has the "same symptoms" as when he was hospitalized last time.  He just feels weak all over.  Reports that this happens after dialysis.  He reports he made it through most of his dialysis today and only had 30 minutes left.  He reports that he was taken off because he reports he just hurts everywhere and could not complete the session.  Not developed a fever in the meantime.  No cough or increased shortness of breath.  He reports after being hospitalized he felt slightly better but mostly has felt the same and not well ever since his hospitalization. Past Medical History:  Diagnosis Date   AICD (automatic cardioverter/defibrillator) present    Cardiac arrest ([place]) 03/07/2013   Cardiac arrest due to underlying cardiac condition [facility])    CHF (congestive heart failure) ([place])    CKD (chronic kidney disease), stage III ([place])    Complication of anesthesia    got too much anesthesia and bp dropped very low Feb, 2019   Edema    ESRD on dialysis [facility])    History of blood transfusion 1950's   "related to pinal menigitis; HAD 16 OPERATIONS TOTAL"   Hyperlipemia    Hypertension    Ileus ([place]) 05/2015   Myocardial infarction ([place]) 03/2013   Pain in the chest    Pneumonia 2016   Rash and nonspecific skin eruption 06/29/2014   Scabies infestation 06/29/2014   Shortness of breath dyspnea    Type II diabetes mellitus ([place])    type 2   Ventricular tachycardia [facility])     Patient Active Problem List   Diagnosis Date Noted   Atypical chest pain 07/30/2018   S/P CABG (coronary artery bypass graft) 04/01/2018   Claudication ([place]) 04/01/2018   Chronic diastolic (congestive) heart  failure ([place]) 04/01/2018   Paresthesia 12/07/2015   BPH (benign prostatic hyperplasia)    Atherosclerosis of native coronary artery of native heart without angina pectoris 90/24/0973   Chronic systolic (congestive) heart failure ([place]) 10/01/2015   Encounter for fitting or adjustment of implantable cardioverter-defibrillator (ICD)    Ileus, postoperative (HCC)    Impaired nasal gastric feeding tube    Impaired nasogastric feeding tube    Ileus (HCC)    ESRD (end stage renal disease) ([place])    Gallstones    Gallbladder calculus 06/15/2015   Numbness    RUQ abdominal pain    CHF exacerbation ([place]) 03/10/2015   Heart failure, acute on chronic, systolic and diastolic ([place]) 53/29/9242   Acute on chronic congestive heart failure (HCC)    Diabetes mellitus with complication (HCC)    CHF (congestive heart failure) ([place]) 08/03/2014   Hypertensive urgency 08/03/2014   Controlled type 2 diabetes with neuropathy ([place]) 08/03/2014   Obesity (BMI 30-39.9) 08/03/2014   Bilateral leg pain 08/03/2014   Acute on chronic combined systolic and diastolic CHF (congestive heart failure) ([place]) 07/02/2014   CKD (chronic kidney disease) stage 3, GFR 30-59 ml/min ([place]) 06/29/2014   DM (diabetes mellitus), type 2 with renal complications ([place]) 68/34/1962   Single chamber ICD-St.Jude 1411-36C Ellipse VR-CorVue-Merlin (DOI: 03/24/2013) in situ 06/29/2014   Essential hypertension, benign 06/29/2014  Hepatic cirrhosis ([place]) 11/28/2013   Cardiomyopathy, ischemic 11/28/2013   Hepatitis C antibody test positive 11/28/2013   Cirrhosis ([place])    Type 2 diabetes mellitus with hyperglycemia ([place])    Diabetes mellitus with hypoglycemia ([place]) 04/29/2013   HCAP (healthcare-associated pneumonia) 04/28/2013   DM type 2 (diabetes mellitus, type 2) ([place]) 04/28/2013   Hyperlipemia    Ventricular fibrillation ([place]) 03/08/2013   Seizure ([place]) 03/07/2013    Past Surgical History:    Procedure Laterality Date   AV FISTULA PLACEMENT Right 03/18/2015   Procedure: BRACHIOCEPHALIC ARTERIOVENOUS (AV) FISTULA CREATION;  Surgeon: Angelia Mould, MD;  Location: [facility] [place];  Service: Vascular;  Laterality: Right;   BACK SURGERY  1950's   "for spinal menigitis; HAD 16 OPERATIONS TOTAL"   CARDIAC CATHETERIZATION     CHOLECYSTECTOMY N/A 06/17/2015   Procedure: LAPAROSCOPIC CHOLECYSTECTOMY;  Surgeon: Coralie Keens, MD;  Location: [facility];  Service: General;  Laterality: N/A;   CORONARY ARTERY BYPASS GRAFT  1999   cabg x4   EYE SURGERY Bilateral 1950's   "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"   HEAD SURGERY  1950'S   "for spinal menigitis; HAD 16 OPERATIONS TOTAL"   IMPLANTABLE CARDIOVERTER DEFIBRILLATOR IMPLANT  03/24/2013   STJ single chamber ICD implanted by Dr Caryl Comes for secondary prevention   IMPLANTABLE CARDIOVERTER DEFIBRILLATOR IMPLANT N/A 03/24/2013   Procedure: IMPLANTABLE CARDIOVERTER DEFIBRILLATOR IMPLANT;  Surgeon: Deboraha Sprang, MD;  Location[facility] C[facility] LAB;  Service: Cardiovascular;  Laterality: N/A;   INSERTION OF DIALYSIS CATHETER N/A 03/13/2015   Procedure: INSERTION OF TUNNELED DIALYSIS CATHETER;  Surgeon: Conrad Attica, MD;  Location: [facility];  Service: Vascular;  Laterality: N/A;   LEFT HEART CATHETERIZATION WITH CORONARY/GRAFT ANGIOGRAM  03/07/2013   Procedure: LEFT HEART CATHETERIZATION WITH [name];  Surgeon: Clent Demark, MD;  Location: [facility] CATH LAB;  Service: Cardiovascular;;   REVISON OF ARTERIOVENOUS FISTULA Right 01/03/2018   Procedure: REVISION OF ARTERIOVENOUS FISTULA;  Surgeon: Waynetta Sandy, MD;  Location: [facility];  Service: Vascular;  Laterality: Right;   UMBILICAL HERNIA REPAIR N/A 06/24/2015   Procedure: HERNIA REPAIR UMBILICAL ADULT;  Surgeon: Georganna Skeans, MD;  Location: [facility];  Service: General;  Laterality: N/A;        Home Medications    Prior to Admission medications   Medication Sig Start Date  End Date Taking? Authorizing Provider  acetaminophen (TYLENOL) 325 MG tablet Take 2 tablets (650 mg total) by mouth every 6 (six) hours as needed for mild pain or moderate pain. 07/01/15   Smiley Houseman, MD  amLODipine (NORVASC) 10 MG tablet Take 10 mg by mouth daily. 05/23/18   [provider]  aspirin EC 81 MG tablet Take 81 mg by mouth daily.     [provider]  benzonatate (TESSALON) 100 MG capsule Take 1 capsule (100 mg total) by mouth 3 (three) times daily as needed for cough. 07/31/18   Welford Roche, MD  calcium acetate (PHOSLO) 667 MG capsule Take 667 mg by mouth 3 (three) times daily with meals.     [provider]  cinacalcet (SENSIPAR) 30 MG tablet Take 30 mg by mouth daily with supper.    [provider]  furosemide (LASIX) 80 MG tablet Take 1 tablet (80 mg total) by mouth every morning. Do not take on days of dialysis 07/17/18 10/15/18  Adrian Prows, MD  gabapentin (NEURONTIN) 300 MG capsule Take 1 capsule (300 mg total) by mouth 2 (two) times daily. 04/01/18  Miquel Dunn, NP  hydrALAZINE (APRESOLINE) 10 MG tablet Take 10 mg by mouth daily as needed (anxiety).    [provider]  isosorbide mononitrate (IMDUR) 60 MG 24 hr tablet Take 60 mg by mouth as needed (Heart Rate).     [provider]  metoprolol tartrate (LOPRESSOR) 25 MG tablet Take 1 tablet (25 mg total) by mouth 2 (two) times daily. 05/29/18 08/27/18  Adrian Prows, MD  multivitamin (RENA-VIT) TABS tablet Take 1 tablet by mouth at bedtime. 07/01/15   Smiley Houseman, MD  pantoprazole (PROTONIX) 40 MG tablet Take 40 mg by mouth as needed (acid reflux).     [provider]  rosuvastatin (CRESTOR) 20 MG tablet Take 1 tablet (20 mg total) by mouth daily at 6 PM. 01/05/18   Kayleen Memos, DO    Family History Family History  Problem Relation Age of Onset   Heart disease Mother    Diabetes Mother     Social History Social History   Tobacco Use    Smoking status: Former Smoker    Packs/day: 1.00    Years: 5.00    Pack years: 5.00    Types: Cigarettes   Smokeless tobacco: Never Used  Substance Use Topics   Alcohol use: No    Alcohol/week: 0.0 standard drinks   Drug use: No     Allergies   Gabapentin   Review of Systems Review of Systems 10 Systems reviewed and are negative for acute change except as noted in the HPI.   Physical Exam Updated Vital Signs BP 135/79    Pulse 70    Temp 98.8 F (37.1 C) (Oral)    Resp 19    Ht 5\' 6"  (1.676 m)    Wt 81.6 kg    SpO2 97%    BMI 29.05 kg/m   Physical Exam Constitutional:      Appearance: He is well-developed.     Comments: Patient is alert and nontoxic.  No respiratory distress.  Slightly deconditioned in appearance but well maintained.  HENT:     Head: Normocephalic and atraumatic.  Eyes:     Extraocular Movements: Extraocular movements intact.     Conjunctiva/sclera: Conjunctivae normal.  Neck:     Musculoskeletal: Neck supple.  Cardiovascular:     Rate and Rhythm: Normal rate and regular rhythm.     Heart sounds: Normal heart sounds.  Pulmonary:     Effort: Pulmonary effort is normal.     Breath sounds: Normal breath sounds.  Abdominal:     General: Bowel sounds are normal. There is no distension.     Palpations: Abdomen is soft.     Tenderness: There is no abdominal tenderness.     Comments: Patient is abdomen is mildly obese but does not have taut distention suggestive of significant ascites.  General skin condition is good with normal hair distribution and quality of overlying skin.  Musculoskeletal: Normal range of motion.        General: No swelling or tenderness.     Comments: Patient does not have any significant peripheral edema.  Condition of the lower extremities is good.  Calves are soft and nontender.  Skin:    General: Skin is warm and dry.  Neurological:     General: No focal deficit present.     Mental Status: He is alert and oriented to  person, place, and time.     GCS: GCS eye subscore is 4. GCS verbal subscore is 5. GCS  motor subscore is 6.     Coordination: Coordination normal.  Psychiatric:     Comments: Affect is slightly flat.      ED Treatments / Results  Labs (all labs ordered are listed, but only abnormal results are displayed) Labs Reviewed  COMPREHENSIVE METABOLIC PANEL - Abnormal; Notable for the following components:      Result Value   Chloride 94 (*)    BUN 35 (*)    Creatinine, Ser 5.99 (*)    Calcium 8.3 (*)    Albumin 3.3 (*)    GFR calc non Af Amer 9 (*)    GFR calc Af Amer 11 (*)    All other components within normal limits  CBC WITH DIFFERENTIAL/PLATELET - Abnormal; Notable for the following components:   WBC 3.6 (*)    RBC 2.68 (*)    Hemoglobin 8.5 (*)    HCT 27.9 (*)    MCV 104.1 (*)    RDW 16.4 (*)    Platelets 48 (*)    All other components within normal limits  PROTIME-INR - Abnormal; Notable for the following components:   Prothrombin Time 19.5 (*)    INR 1.7 (*)    All other components within normal limits  POCT I-STAT EG7 - Abnormal; Notable for the following components:   pH, Ven 7.609 (*)    pCO2, Ven 31.4 (*)    pO2, Ven 116.0 (*)    Bicarbonate 31.5 (*)    Acid-Base Excess 10.0 (*)    Calcium, Ion 0.85 (*)    HCT 29.0 (*)    Hemoglobin 9.9 (*)    All other components within normal limits  TROPONIN I (HIGH SENSITIVITY) - Abnormal; Notable for the following components:   Troponin I (High Sensitivity) 29 (*)    All other components within normal limits  CULTURE, BLOOD (ROUTINE X 2)  CULTURE, BLOOD (ROUTINE X 2)  ETHANOL  LIPASE, BLOOD  LACTIC ACID, PLASMA  MAGNESIUM  PHOSPHORUS  CK  LACTIC ACID, PLASMA  I-STAT VENOUS BLOOD GAS, ED  TROPONIN I (HIGH SENSITIVITY)    EKG None  Radiology Dg Chest Port 1 View  Result Date: 08/03/2018 CLINICAL DATA:  Chest pain EXAM: PORTABLE CHEST 1 VIEW COMPARISON:  July 30, 2018 FINDINGS: There is no edema or  consolidation. There is stable cardiomegaly with pulmonary vascularity within normal limits. Pacemaker lead attached to right ventricle. Patient is status post coronary artery bypass grafting. No adenopathy. No bone lesions. IMPRESSION: Stable cardiomegaly. No edema or consolidation. Stable pacemaker lead placement. Electronically Signed   By: Lowella Grip III M.D.   On: 08/03/2018 11:03    Procedures Procedures (including critical care time)  Medications Ordered in ED Medications - No data to display   Initial Impression / Assessment and Plan / ED Course  I have reviewed the triage vital signs and the nursing notes.  Pertinent labs & imaging results that were available during my care of the patient were reviewed by me and considered in my medical decision making (see chart for details).       Patient is having persistent problems with generalized weakness and body aches.  It appears this is largely associated with dialysis.  There are no immediate acute causes evident for this fairly persistent and indolent generalized symptoms.  Patient does have anemia that is stable but may be contributing to some generalized weakness and fatigue.  Patient is not a candidate for transfusion at this time but is encouraged to talk to  his nephrologist for possible treatment options.  At this time no suggestion of infectious etiology.  Patient's abdomen is soft.  He does not have any significant distention or anasarca.  Cultures have been obtained due to underlying risk factors for infectious conditions but at this time I do not feel that empiric antibiotic treatment or admission is indicated.  Plan is reviewed.  Final Clinical Impressions(s) / ED Diagnoses   Final diagnoses:  Generalized weakness  Generalized pain  ESRD (end stage renal disease) on dialysis [facility])  Severe comorbid illness    ED Discharge Orders    None       Charlesetta Shanks, MD 08/03/18 1319

## 2018-08-08 LAB — CULTURE, BLOOD (ROUTINE X 2)
Culture: NO GROWTH
Culture: NO GROWTH
Special Requests: ADEQUATE

## 2018-08-20 ENCOUNTER — Emergency Department (HOSPITAL_COMMUNITY)
Admission: EM | Admit: 2018-08-20 | Discharge: 2018-08-20 | Disposition: A | Discharge status: Home / Self Care | Payer: Medicare Other | Attending: Emergency Medicine | Admitting: Emergency Medicine

## 2018-08-20 ENCOUNTER — Emergency Department (HOSPITAL_COMMUNITY): Payer: Medicare Other

## 2018-08-20 ENCOUNTER — Other Ambulatory Visit: Payer: Self-pay

## 2018-08-20 ENCOUNTER — Encounter (HOSPITAL_COMMUNITY): Payer: Self-pay | Admitting: Emergency Medicine

## 2018-08-20 DIAGNOSIS — Z7982 Long term (current) use of aspirin: Secondary | ICD-10-CM | POA: Insufficient documentation

## 2018-08-20 DIAGNOSIS — Z79899 Other long term (current) drug therapy: Secondary | ICD-10-CM | POA: Insufficient documentation

## 2018-08-20 DIAGNOSIS — I132 Hypertensive heart and chronic kidney disease with heart failure and with stage 5 chronic kidney disease, or end stage renal disease: Secondary | ICD-10-CM | POA: Diagnosis not present

## 2018-08-20 DIAGNOSIS — Z87891 Personal history of nicotine dependence: Secondary | ICD-10-CM | POA: Insufficient documentation

## 2018-08-20 DIAGNOSIS — N186 End stage renal disease: Secondary | ICD-10-CM | POA: Diagnosis not present

## 2018-08-20 DIAGNOSIS — E119 Type 2 diabetes mellitus without complications: Secondary | ICD-10-CM | POA: Insufficient documentation

## 2018-08-20 DIAGNOSIS — Z992 Dependence on renal dialysis: Secondary | ICD-10-CM | POA: Insufficient documentation

## 2018-08-20 DIAGNOSIS — R188 Other ascites: Secondary | ICD-10-CM | POA: Insufficient documentation

## 2018-08-20 DIAGNOSIS — I2510 Atherosclerotic heart disease of native coronary artery without angina pectoris: Secondary | ICD-10-CM | POA: Diagnosis not present

## 2018-08-20 DIAGNOSIS — I509 Heart failure, unspecified: Secondary | ICD-10-CM | POA: Insufficient documentation

## 2018-08-20 HISTORY — PX: IR PARACENTESIS: IMG2679

## 2018-08-20 LAB — COMPREHENSIVE METABOLIC PANEL
ALT: 17 U/L (ref 0–44)
AST: 17 U/L (ref 15–41)
Albumin: 3.4 g/dL — ABNORMAL LOW (ref 3.5–5.0)
Alkaline Phosphatase: 53 U/L (ref 38–126)
Anion gap: 14 (ref 5–15)
BUN: 25 mg/dL — ABNORMAL HIGH (ref 8–23)
CO2: 28 mmol/L (ref 22–32)
Calcium: 8.7 mg/dL — ABNORMAL LOW (ref 8.9–10.3)
Chloride: 93 mmol/L — ABNORMAL LOW (ref 98–111)
Creatinine, Ser: 6.18 mg/dL — ABNORMAL HIGH (ref 0.61–1.24)
GFR calc Af Amer: 10 mL/min — ABNORMAL LOW (ref >60)
GFR calc non Af Amer: 9 mL/min — ABNORMAL LOW (ref >60)
Glucose, Bld: 87 mg/dL (ref 70–99)
Potassium: 3.5 mmol/L (ref 3.5–5.1)
Sodium: 135 mmol/L (ref 135–145)
Total Bilirubin: 1 mg/dL (ref 0.3–1.2)
Total Protein: 8.5 g/dL — ABNORMAL HIGH (ref 6.5–8.1)

## 2018-08-20 LAB — CBC WITH DIFFERENTIAL/PLATELET
Abs Immature Granulocytes: 0.01 10*3/uL (ref 0.00–0.07)
Basophils Absolute: 0 10*3/uL (ref 0.0–0.1)
Basophils Relative: 0 %
Eosinophils Absolute: 0 10*3/uL (ref 0.0–0.5)
Eosinophils Relative: 1 %
HCT: 32.8 % — ABNORMAL LOW (ref 39.0–52.0)
Hemoglobin: 10.2 g/dL — ABNORMAL LOW (ref 13.0–17.0)
Immature Granulocytes: 0 %
Lymphocytes Relative: 16 %
Lymphs Abs: 0.5 10*3/uL — ABNORMAL LOW (ref 0.7–4.0)
MCH: 32.1 pg (ref 26.0–34.0)
MCHC: 31.1 g/dL (ref 30.0–36.0)
MCV: 103.1 fL — ABNORMAL HIGH (ref 80.0–100.0)
Monocytes Absolute: 0.2 10*3/uL (ref 0.1–1.0)
Monocytes Relative: 6 %
Neutro Abs: 2.5 10*3/uL (ref 1.7–7.7)
Neutrophils Relative %: 77 %
Platelets: 41 10*3/uL — ABNORMAL LOW (ref 150–400)
RBC: 3.18 MIL/uL — ABNORMAL LOW (ref 4.22–5.81)
RDW: 15.7 % — ABNORMAL HIGH (ref 11.5–15.5)
WBC: 3.3 10*3/uL — ABNORMAL LOW (ref 4.0–10.5)
nRBC: 0 % (ref 0.0–0.2)

## 2018-08-20 LAB — BODY FLUID CELL COUNT WITH DIFFERENTIAL
Lymphs, Fluid: 63 %
Monocyte-Macrophage-Serous Fluid: 31 % — ABNORMAL LOW (ref 50–90)
Neutrophil Count, Fluid: 6 % (ref 0–25)
Total Nucleated Cell Count, Fluid: 230 cu mm (ref 0–1000)

## 2018-08-20 LAB — LACTATE DEHYDROGENASE, PLEURAL OR PERITONEAL FLUID: LD, Fluid: 66 U/L — ABNORMAL HIGH (ref 3–23)

## 2018-08-20 LAB — LIPASE, BLOOD: Lipase: 29 U/L (ref 11–51)

## 2018-08-20 MED ORDER — LIDOCAINE HCL (PF) 1 % IJ SOLN
INTRAMUSCULAR | Status: AC | PRN
  Administered 2018-08-20: 10 mL

## 2018-08-20 MED ORDER — MORPHINE SULFATE (PF) 4 MG/ML IV SOLN
4.0000 mg | Freq: Once | INTRAVENOUS | Status: AC
  Administered 2018-08-20: 4 mg via INTRAVENOUS
  Filled 2018-08-20: qty 1

## 2018-08-20 MED ORDER — LIDOCAINE HCL 1 % IJ SOLN
INTRAMUSCULAR | Status: AC
  Filled 2018-08-20: qty 20

## 2018-08-20 NOTE — ED Provider Notes (Signed)
63 year old male signed out to me by Dr. Stark Jock.  He presented today with abdominal pain and distention with new ascites.  Patient had paracentesis performed by IR.  3.7 L was removed.  Peritoneal fluid significant for 230 white blood cells.  This has been sent for culture, Gram stain is in process. Discussed with Dr. Ardis Hughs and patient may follow up op    Pattricia Boss, MD 08/20/18 [phone]

## 2018-08-20 NOTE — Discharge Instructions (Addendum)
You had your ascites fluid removed Please call [facility] for follow up

## 2018-08-20 NOTE — ED Triage Notes (Signed)
Pt arrives to ED from his dialysis center with complaints of generalized abdominal pain and increasing abdominal swelling for a couple of weeks. EMS reports patient only finished half his treatment today due to the increased swelling the abdomen.

## 2018-08-20 NOTE — ED Notes (Signed)
Family at bedside. 

## 2018-08-20 NOTE — ED Notes (Signed)
Patient Alert and oriented to baseline. Stable and ambulatory to baseline. Patient verbalized understanding of the discharge instructions.  Patient belongings were taken by the patient.   

## 2018-08-20 NOTE — ED Notes (Signed)
Patient transferred to IR for Paracentesis

## 2018-08-20 NOTE — ED Notes (Signed)
[phone], Jola Baptist (daughter)

## 2018-08-20 NOTE — Procedures (Addendum)
PROCEDURE SUMMARY:  Successful image-guided paracentesis from the right lateral abdomen.  Yielded 3.7 liters of hazy amber fluid.  No immediate complications.  EBL = 0 mL. Patient tolerated well.   Specimen was sent for labs.  Earley Abide PA-C 08/20/2018 4:34 PM

## 2018-08-20 NOTE — ED Provider Notes (Signed)
[facility] EMERGENCY DEPARTMENT Provider Note   CSN: [id number] Arrival date & time: 08/20/18  1045     History   Chief Complaint Chief Complaint  Patient presents with   Abdominal Pain    HPI Eric [name] is a 63 y.o. male.     Patient is a 63 year old male with extensive past medical history including coronary artery disease/CABG, cardiomyopathy with defibrillator, congestive heart failure, end-stage renal disease on hemodialysis, cirrhosis, and hepatitis C.  He presents today for evaluation of abdominal distention and discomfort.  This is worsened over the past several days.  He denies any fevers or chills.  He denies any bowel or bladder complaints.  The history is provided by the patient.  Abdominal Pain Pain location:  Generalized Pain quality: cramping   Pain radiates to:  Does not radiate Pain severity:  Moderate Onset quality:  Gradual Duration:  3 days Timing:  Constant Progression:  Worsening Chronicity:  Recurrent Relieved by:  Nothing Worsened by:  Movement and palpation Ineffective treatments:  None tried   Past Medical History:  Diagnosis Date   AICD (automatic cardioverter/defibrillator) present    Cardiac arrest ([place]) 03/07/2013   Cardiac arrest due to underlying cardiac condition [facility])    CHF (congestive heart failure) ([place])    CKD (chronic kidney disease), stage III ([place])    Complication of anesthesia    got too much anesthesia and bp dropped very low Feb, 2019   Edema    ESRD on dialysis [facility])    History of blood transfusion 1950's   "related to pinal menigitis; HAD 16 OPERATIONS TOTAL"   Hyperlipemia    Hypertension    Ileus ([place]) 05/2015   Myocardial infarction ([place]) 03/2013   Pain in the chest    Pneumonia 2016   Rash and nonspecific skin eruption 06/29/2014   Scabies infestation 06/29/2014   Shortness of breath dyspnea    Type II diabetes mellitus ([place])    type 2   Ventricular tachycardia [facility])       Patient Active Problem List   Diagnosis Date Noted   Atypical chest pain 07/30/2018   S/P CABG (coronary artery bypass graft) 04/01/2018   Claudication ([place]) 04/01/2018   Chronic diastolic (congestive) heart failure ([place]) 04/01/2018   Paresthesia 12/07/2015   BPH (benign prostatic hyperplasia)    Atherosclerosis of native coronary artery of native heart without angina pectoris 32/99/2426   Chronic systolic (congestive) heart failure ([place]) 10/01/2015   Encounter for fitting or adjustment of implantable cardioverter-defibrillator (ICD)    Ileus, postoperative (HCC)    Impaired nasal gastric feeding tube    Impaired nasogastric feeding tube    Ileus (HCC)    ESRD (end stage renal disease) ([place])    Gallstones    Gallbladder calculus 06/15/2015   Numbness    RUQ abdominal pain    CHF exacerbation ([place]) 03/10/2015   Heart failure, acute on chronic, systolic and diastolic ([place]) 83/41/9622   Acute on chronic congestive heart failure (HCC)    Diabetes mellitus with complication (HCC)    CHF (congestive heart failure) ([place]) 08/03/2014   Hypertensive urgency 08/03/2014   Controlled type 2 diabetes with neuropathy ([place]) 08/03/2014   Obesity (BMI 30-39.9) 08/03/2014   Bilateral leg pain 08/03/2014   Acute on chronic combined systolic and diastolic CHF (congestive heart failure) ([place]) 07/02/2014   CKD (chronic kidney disease) stage 3, GFR 30-59 ml/min ([place]) 06/29/2014   DM (diabetes mellitus), type 2 with renal complications ([place]) 29/79/8921  Single chamber ICD-St.Jude 1411-36C Ellipse VR-CorVue-Merlin (DOI: 03/24/2013) in situ 06/29/2014   Essential hypertension, benign 06/29/2014   Hepatic cirrhosis ([place]) 11/28/2013   Cardiomyopathy, ischemic 11/28/2013   Hepatitis C antibody test positive 11/28/2013   Cirrhosis ([place])    Type 2 diabetes mellitus with hyperglycemia ([place])    Diabetes mellitus with hypoglycemia ([place]) 04/29/2013   HCAP  (healthcare-associated pneumonia) 04/28/2013   DM type 2 (diabetes mellitus, type 2) ([place]) 04/28/2013   Hyperlipemia    Ventricular fibrillation ([place]) 03/08/2013   Seizure ([place]) 03/07/2013    Past Surgical History:  Procedure Laterality Date   AV FISTULA PLACEMENT Right 03/18/2015   Procedure: BRACHIOCEPHALIC ARTERIOVENOUS (AV) FISTULA CREATION;  Surgeon: Angelia Mould, MD;  Location: [facility];  Service: Vascular;  Laterality: Right;   BACK SURGERY  1950's   "for spinal menigitis; HAD 16 OPERATIONS TOTAL"   CARDIAC CATHETERIZATION     CHOLECYSTECTOMY N/A 06/17/2015   Procedure: LAPAROSCOPIC CHOLECYSTECTOMY;  Surgeon: Coralie Keens, MD;  Location: [facility];  Service: General;  Laterality: N/A;   CORONARY ARTERY BYPASS GRAFT  1999   cabg x4   EYE SURGERY Bilateral 1950's   "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"   HEAD SURGERY  1950'S   "for spinal menigitis; HAD 16 OPERATIONS TOTAL"   IMPLANTABLE CARDIOVERTER DEFIBRILLATOR IMPLANT  03/24/2013   STJ single chamber ICD implanted by Dr Caryl Comes for secondary prevention   IMPLANTABLE CARDIOVERTER DEFIBRILLATOR IMPLANT N/A 03/24/2013   Procedure: IMPLANTABLE CARDIOVERTER DEFIBRILLATOR IMPLANT;  Surgeon: Deboraha Sprang, MD;  Location: [facility] CATH LAB;  Service: Cardiovascular;  Laterality: N/A;   INSERTION OF DIALYSIS CATHETER N/A 03/13/2015   Procedure: INSERTION OF TUNNELED DIALYSIS CATHETER;  Surgeon: Conrad Gorman, MD;  Location: [facility];  Service: Vascular;  Laterality: N/A;   LEFT HEART CATHETERIZATION WITH CORONARY/GRAFT ANGIOGRAM  03/07/2013   Procedure: LEFT HEART CATHETERIZATION WITH [name];  Surgeon: Clent Demark, MD;  Location: [facility] CATH LAB;  Service: Cardiovascular;;   REVISON OF ARTERIOVENOUS FISTULA Right 01/03/2018   Procedure: REVISION OF ARTERIOVENOUS FISTULA;  Surgeon: Waynetta Sandy, MD;  Location: [facility];  Service: Vascular;  Laterality: Right;   UMBILICAL HERNIA REPAIR N/A 06/24/2015     Procedure: HERNIA REPAIR UMBILICAL ADULT;  Surgeon: Georganna Skeans, MD;  Location: [facility];  Service: General;  Laterality: N/A;        Home Medications    Prior to Admission medications   Medication Sig Start Date End Date Taking? Authorizing Provider  acetaminophen (TYLENOL) 325 MG tablet Take 2 tablets (650 mg total) by mouth every 6 (six) hours as needed for mild pain or moderate pain. 07/01/15  Yes Smiley Houseman, MD  amLODipine (NORVASC) 10 MG tablet Take 10 mg by mouth daily. 05/23/18  Yes [provider]  aspirin EC 81 MG tablet Take 81 mg by mouth daily.    Yes [provider]  benzonatate (TESSALON) 100 MG capsule Take 1 capsule (100 mg total) by mouth 3 (three) times daily as needed for cough. 07/31/18  Yes Santos-Sanchez, Merlene Morse, MD  calcium acetate (PHOSLO) 667 MG capsule Take 667 mg by mouth 3 (three) times daily with meals.    Yes [provider]  cinacalcet (SENSIPAR) 30 MG tablet Take 30 mg by mouth daily with supper.   Yes [provider]  furosemide (LASIX) 80 MG tablet Take 1 tablet (80 mg total) by mouth every morning. Do not take on days of dialysis 07/17/18 10/15/18 Yes Adrian Prows, MD  gabapentin (NEURONTIN) 300 MG capsule Take 1 capsule (300 mg total) by mouth 2 (two) times daily. 04/01/18  Yes Miquel Dunn, NP  hydrALAZINE (APRESOLINE) 10 MG tablet Take 10 mg by mouth daily as needed (anxiety).   Yes [provider]  isosorbide mononitrate (IMDUR) 60 MG 24 hr tablet Take 60 mg by mouth as needed (Heart Rate).    Yes [provider]  metoprolol tartrate (LOPRESSOR) 25 MG tablet Take 1 tablet (25 mg total) by mouth 2 (two) times daily. 05/29/18 08/27/18 Yes Adrian Prows, MD  multivitamin (RENA-VIT) TABS tablet Take 1 tablet by mouth at bedtime. 07/01/15  Yes Smiley Houseman, MD  pantoprazole (PROTONIX) 40 MG tablet Take 40 mg by mouth as needed (acid reflux).    Yes [provider]  rosuvastatin (CRESTOR)  20 MG tablet Take 1 tablet (20 mg total) by mouth daily at 6 PM. 01/05/18  Yes Kayleen Memos, DO    Family History Family History  Problem Relation Age of Onset   Heart disease Mother    Diabetes Mother     Social History Social History   Tobacco Use   Smoking status: Former Smoker    Packs/day: 1.00    Years: 5.00    Pack years: 5.00    Types: Cigarettes   Smokeless tobacco: Never Used  Substance Use Topics   Alcohol use: No    Alcohol/week: 0.0 standard drinks   Drug use: No     Allergies   Patient has no known allergies.   Review of Systems Review of Systems  Gastrointestinal: Positive for abdominal pain.  All other systems reviewed and are negative.    Physical Exam Updated Vital Signs BP 121/82 (BP Location: Left Arm)    Pulse 70    Temp 98.2 F (36.8 C) (Oral)    Resp 20    SpO2 97%   Physical Exam Vitals signs and nursing note reviewed.  Constitutional:      General: He is not in acute distress.    Appearance: He is well-developed. He is not diaphoretic.  HENT:     Head: Normocephalic and atraumatic.  Neck:     Musculoskeletal: Normal range of motion and neck supple.  Cardiovascular:     Rate and Rhythm: Normal rate and regular rhythm.     Heart sounds: No murmur. No friction rub.  Pulmonary:     Effort: Pulmonary effort is normal. No respiratory distress.     Breath sounds: Normal breath sounds. No wheezing or rales.  Abdominal:     General: Bowel sounds are normal. There is distension.     Palpations: Abdomen is soft. There is fluid wave.     Tenderness: There is generalized abdominal tenderness. There is no guarding or rebound.  Musculoskeletal: Normal range of motion.  Skin:    General: Skin is warm and dry.  Neurological:     Mental Status: He is alert and oriented to person, place, and time.     Coordination: Coordination normal.      ED Treatments / Results  Labs (all labs ordered are listed, but only abnormal results are  displayed) Labs Reviewed  COMPREHENSIVE METABOLIC PANEL  LIPASE, BLOOD  CBC WITH DIFFERENTIAL/PLATELET    EKG None  Radiology No results found.  Procedures Procedures (including critical care time)  Medications Ordered in ED Medications - No data to display   Initial Impression / Assessment and Plan / ED Course  I have reviewed the triage vital  signs and the nursing notes.  Pertinent labs & imaging results that were available during my care of the patient were reviewed by me and considered in my medical decision making (see chart for details).  Patient CT scan shows ascites throughout the abdomen.  I feel as though patient will require paracentesis.  This has been discussed with Dr. Anselm Pancoast from radiology who will perform a ultrasound-guided paracentesis.  Care will be signed out to Dr. Marcie Bal at shift change.  She will obtain the results of the analysis of the ascites and determine the final disposition.  CRITICAL CARE Performed by: Veryl Speak Total critical care time: 35 minutes Critical care time was exclusive of separately billable procedures and treating other patients. Critical care was necessary to treat or prevent imminent or life-threatening deterioration. Critical care was time spent personally by me on the following activities: development of treatment plan with patient and/or surrogate as well as nursing, discussions with consultants, evaluation of patient's response to treatment, examination of patient, obtaining history from patient or surrogate, ordering and performing treatments and interventions, ordering and review of laboratory studies, ordering and review of radiographic studies, pulse oximetry and re-evaluation of patient's condition.   Final Clinical Impressions(s) / ED Diagnoses   Final diagnoses:  None    ED Discharge Orders    None       Veryl Speak, MD 08/21/18 [phone]

## 2018-08-21 LAB — AMYLASE, PLEURAL OR PERITONEAL FLUID: Amylase, Fluid: <5 U/L

## 2018-08-21 LAB — GRAM STAIN

## 2018-08-25 LAB — CULTURE, BODY FLUID W GRAM STAIN -BOTTLE: Culture: NO GROWTH

## 2018-08-26 ENCOUNTER — Ambulatory Visit (INDEPENDENT_AMBULATORY_CARE_PROVIDER_SITE_OTHER): Payer: Medicare Other | Admitting: Internal Medicine

## 2018-08-26 ENCOUNTER — Encounter: Payer: Self-pay | Admitting: Internal Medicine

## 2018-08-26 VITALS — BP 118/72 | HR 64 | Temp 97.6°F | Ht 66.0 in | Wt 188.0 lb

## 2018-08-26 DIAGNOSIS — B1920 Unspecified viral hepatitis C without hepatic coma: Secondary | ICD-10-CM | POA: Diagnosis not present

## 2018-08-26 DIAGNOSIS — K7469 Other cirrhosis of liver: Secondary | ICD-10-CM | POA: Diagnosis not present

## 2018-08-26 DIAGNOSIS — R188 Other ascites: Secondary | ICD-10-CM | POA: Diagnosis not present

## 2018-08-26 DIAGNOSIS — I2510 Atherosclerotic heart disease of native coronary artery without angina pectoris: Secondary | ICD-10-CM | POA: Diagnosis not present

## 2018-08-26 NOTE — Patient Instructions (Signed)
You have been scheduled for an abdominal paracentesis at [facility] radiology (1st floor of hospital) on 09/11/18 at 1:00pm. Please arrive at least 15 minutes prior to your appointment time for registration. Should you need to reschedule this appointment for any reason, please call our office at [phone].\  I have referred you to Infectious Disease - they will contact you to schedule an appointment.

## 2018-08-26 NOTE — Progress Notes (Signed)
HISTORY OF PRESENT ILLNESS:  Eric [name] is a 63 y.o. male with multiple significant medical problems including but not limited to hypertension, diabetes mellitus, congestive heart failure, prior cardiac arrest status post AICD placement, and end-stage renal disease on hemodialysis 3 times per week.  He is sent today regarding ascites, hepatic cirrhosis, and a history of hepatitis C.  Patient is accompanied today by his wife.  He has lived in [place] and [place] but now resides in [place].  No PCP but he apparently has an appointment for a new provider in September.  Recently went to the hospital and was admitted to the internal medicine residency service with chief complaint of abdominal distention.  Abdominal ultrasound revealed nodular liver consistent with cirrhosis.  CT scan from June 2017 revealed a small bowel obstruction due to abdominal wall hernia.  Follow-up CT scan September 2017 revealed a nodular liver.  On August 20, 2018 patient underwent a 3.7 L paracentesis.  The cytology was negative for malignancy.  No SBP.  Laboratories revealed normal liver tests.  Albumin 3.4.  Negative alcohol screen.  Sodium 136.  INR 1.7.  Pancytopenia with platelets of 48,000 and MCV 104.1.  Previous testing for hemochromatosis was negative.  In 2015 hepatitis C antibody was positive.  Subsequent genotype was 2b with elevated quantitative RNA viral load.  Patient denies any risk factors for hepatitis C such as IV drugs, tattoos, or transfusion.  Denies a history of alcohol use.  He is accompanied by his wife.  He has poor insight into his medical problems, it appears.  His only complaint today is abdominal distention.  He did feel better after his recent paracentesis but notes reaccumulated some fluid.  He is breathing at rest.  He did see his cardiologist recently.  Placed on Lasix 80 mg daily.  He is hepatitis a and B nave  REVIEW OF SYSTEMS:  All non-GI ROS negative unless otherwise stated in the HPI  except for dyspnea with exertion, arthritis  Past Medical History:  Diagnosis Date  . AICD (automatic cardioverter/defibrillator) present   . Cardiac arrest ([place]) 03/07/2013  . Cardiac arrest due to underlying cardiac condition ([place])   . CHF (congestive heart failure) ([place])   . CKD (chronic kidney disease), stage III ([place])   . Complication of anesthesia    got too much anesthesia and bp dropped very low Feb, 2019  . Edema   . ESRD on dialysis ([place])   . History of blood transfusion 1950's   "related to pinal menigitis; HAD 16 OPERATIONS TOTAL"  . Hyperlipemia   . Hypertension   . Ileus ([place]) 05/2015  . Myocardial infarction ([place]) 03/2013  . Pain in the chest   . Pneumonia 2016  . Rash and nonspecific skin eruption 06/29/2014  . Scabies infestation 06/29/2014  . Shortness of breath dyspnea   . Type II diabetes mellitus ([place])    type 2  . Ventricular tachycardia [facility])     Past Surgical History:  Procedure Laterality Date  . AV FISTULA PLACEMENT Right 03/18/2015   Procedure: BRACHIOCEPHALIC ARTERIOVENOUS (AV) FISTULA CREATION;  Surgeon: Angelia Mould, MD;  Location: [facility];  Service: Vascular;  Laterality: Right;  . BACK SURGERY  1950's   "for spinal menigitis; HAD 16 OPERATIONS TOTAL"  . CARDIAC CATHETERIZATION    . CHOLECYSTECTOMY N/A 06/17/2015   Procedure: LAPAROSCOPIC CHOLECYSTECTOMY;  Surgeon: Coralie Keens, MD;  Location: [facility];  Service: General;  Laterality: N/A;  . CORONARY ARTERY BYPASS GRAFT  1999   cabg x4  . 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 HEAD SURGERY  1950'S   "for spinal menigitis; HAD 16 OPERATIONS TOTAL"  . IMPLANTABLE CARDIOVERTER DEFIBRILLATOR IMPLANT  03/24/2013   STJ single chamber ICD implanted by Dr Caryl Comes for secondary prevention  . IMPLANTABLE CARDIOVERTER DEFIBRILLATOR IMPLANT N/A 03/24/2013   Procedure: IMPLANTABLE CARDIOVERTER DEFIBRILLATOR IMPLANT;  Surgeon: Deboraha Sprang, MD;  Location: [facility] CATH LAB;   Service: Cardiovascular;  Laterality: N/A;  . INSERTION OF DIALYSIS CATHETER N/A 03/13/2015   Procedure: INSERTION OF TUNNELED DIALYSIS CATHETER;  Surgeon: Conrad Spencer, MD;  Location: [facility];  Service: Vascular;  Laterality: N/A;  . IR PARACENTESIS  08/20/2018  . LEFT HEART CATHETERIZATION WITH CORONARY/GRAFT ANGIOGRAM  03/07/2013   Procedure: LEFT HEART CATHETERIZATION WITH [name];  Surgeon: Clent Demark, MD;  Location[facility] CATH LAB;  Service: Cardiovascular;;  . REVISON OF ARTERIOVENOUS FISTULA Right 01/03/2018   Procedure: REVISION OF ARTERIOVENOUS FISTULA;  Surgeon: Waynetta Sandy, MD;  Location: [facility];  Service: Vascular;  Laterality: Right;  . UMBILICAL HERNIA REPAIR N/A 06/24/2015   Procedure: HERNIA REPAIR UMBILICAL ADULT;  Surgeon: Georganna Skeans, MD;  Location: [facility];  Service: General;  Laterality: N/A;    Social History [name]  reports that he has quit smoking. His smoking use included cigarettes. He has a 5.00 pack-year smoking history. He has never used smokeless tobacco. He reports that he does not drink alcohol or use drugs.  family history includes Diabetes in his mother; Heart disease in his mother.  No Known Allergies     PHYSICAL EXAMINATION: Vital signs: BP 118/72 (BP Location: Left Arm, Patient Position: Sitting, Cuff Size: Normal)   Pulse 64   Temp 97.6 F (36.4 C) (Oral)   Ht 5\' 6"  (1.676 m)   Wt 188 lb (85.3 kg)   BMI 30.34 kg/m   Constitutional: Chronically ill-appearing, no acute distress Psychiatric: alert and oriented x3, cooperative Eyes: extraocular movements intact, anicteric, conjunctiva pink Mouth: oral pharynx moist, no lesions Neck: supple no lymphadenopathy Cardiovascular: heart regular rate and rhythm, no murmur Lungs: clear to auscultation bilaterally Abdomen: Obese, nontender, mildly distended, probable ascites, no peritoneal signs, normal bowel sounds, no organomegaly Rectal: Omitted Extremities: no  clubbing or cyanosis.  2+ lower extremity edema bilaterally with chronic stasis changes Skin: no lesions on visible extremities Neuro: No focal deficits. No asterixis.   ASSESSMENT:  1.  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  Genotype 2b and elevated viral RNA remotely.  No recent testing. 2.  Abdominal ascites secondary to combination of cirrhosis, end-stage renal disease, and heart failure.  Extremely difficult combination of problems.  His issues with ascites will be chronic.  Hopefully somewhat managed with his dialysis and as needed paracentesis 3.  Multiple medical problems.  PLAN:  1.  Twinrix vaccination series against hepatitis A and B 2.  Refer to infectious disease for an opinion regarding his hepatitis C.  Is he a candidate for treatment? 3.  Arrange ultrasound-guided paracentesis up to 4 L 4.  Routine GI office follow-up 3 months.  Could consider screening endoscopy for varices 5.  Continue close follow-up with his multiple specialists and secure PCP as planned This in person office visit was 45 minutes in duration of which greater than 50% of time was used for counseling regarding his liver disease and difficult to manage ascites due to additional significant comorbid conditions.

## 2018-08-27 NOTE — Addendum Note (Signed)
Addended by: Audrea Muscat on: 08/27/2018 03:51 PM   Modules accepted: Orders

## 2018-09-03 ENCOUNTER — Emergency Department (HOSPITAL_COMMUNITY)
Admission: EM | Admit: 2018-09-03 | Discharge: 2018-09-03 | Disposition: A | Discharge status: Home / Self Care | Payer: Medicare Other | Attending: Emergency Medicine | Admitting: Emergency Medicine

## 2018-09-03 ENCOUNTER — Other Ambulatory Visit: Payer: Self-pay

## 2018-09-03 ENCOUNTER — Encounter (HOSPITAL_COMMUNITY): Payer: Self-pay | Admitting: Emergency Medicine

## 2018-09-03 ENCOUNTER — Emergency Department (HOSPITAL_COMMUNITY): Payer: Medicare Other

## 2018-09-03 DIAGNOSIS — R0602 Shortness of breath: Secondary | ICD-10-CM | POA: Diagnosis not present

## 2018-09-03 DIAGNOSIS — Z79899 Other long term (current) drug therapy: Secondary | ICD-10-CM | POA: Diagnosis not present

## 2018-09-03 DIAGNOSIS — Z951 Presence of aortocoronary bypass graft: Secondary | ICD-10-CM | POA: Insufficient documentation

## 2018-09-03 DIAGNOSIS — I5032 Chronic diastolic (congestive) heart failure: Secondary | ICD-10-CM | POA: Diagnosis not present

## 2018-09-03 DIAGNOSIS — Z7982 Long term (current) use of aspirin: Secondary | ICD-10-CM | POA: Insufficient documentation

## 2018-09-03 DIAGNOSIS — N186 End stage renal disease: Secondary | ICD-10-CM | POA: Insufficient documentation

## 2018-09-03 DIAGNOSIS — Y828 Other medical devices associated with adverse incidents: Secondary | ICD-10-CM | POA: Insufficient documentation

## 2018-09-03 DIAGNOSIS — T8090XS Unspecified complication following infusion and therapeutic injection, sequela: Secondary | ICD-10-CM | POA: Diagnosis not present

## 2018-09-03 DIAGNOSIS — Z95811 Presence of heart assist device: Secondary | ICD-10-CM | POA: Insufficient documentation

## 2018-09-03 DIAGNOSIS — I132 Hypertensive heart and chronic kidney disease with heart failure and with stage 5 chronic kidney disease, or end stage renal disease: Secondary | ICD-10-CM | POA: Insufficient documentation

## 2018-09-03 DIAGNOSIS — Z87891 Personal history of nicotine dependence: Secondary | ICD-10-CM | POA: Diagnosis not present

## 2018-09-03 DIAGNOSIS — R600 Localized edema: Secondary | ICD-10-CM | POA: Insufficient documentation

## 2018-09-03 DIAGNOSIS — I252 Old myocardial infarction: Secondary | ICD-10-CM | POA: Diagnosis not present

## 2018-09-03 DIAGNOSIS — R52 Pain, unspecified: Secondary | ICD-10-CM | POA: Diagnosis not present

## 2018-09-03 DIAGNOSIS — Z992 Dependence on renal dialysis: Secondary | ICD-10-CM | POA: Diagnosis not present

## 2018-09-03 LAB — COMPREHENSIVE METABOLIC PANEL
ALT: 16 U/L (ref 0–44)
AST: 16 U/L (ref 15–41)
Albumin: 3.2 g/dL — ABNORMAL LOW (ref 3.5–5.0)
Alkaline Phosphatase: 56 U/L (ref 38–126)
Anion gap: 11 (ref 5–15)
BUN: 21 mg/dL (ref 8–23)
CO2: 33 mmol/L — ABNORMAL HIGH (ref 22–32)
Calcium: 8.7 mg/dL — ABNORMAL LOW (ref 8.9–10.3)
Chloride: 93 mmol/L — ABNORMAL LOW (ref 98–111)
Creatinine, Ser: 5.11 mg/dL — ABNORMAL HIGH (ref 0.61–1.24)
GFR calc Af Amer: 13 mL/min — ABNORMAL LOW (ref >60)
GFR calc non Af Amer: 11 mL/min — ABNORMAL LOW (ref >60)
Glucose, Bld: 78 mg/dL (ref 70–99)
Potassium: 3.6 mmol/L (ref 3.5–5.1)
Sodium: 137 mmol/L (ref 135–145)
Total Bilirubin: 0.5 mg/dL (ref 0.3–1.2)
Total Protein: 8 g/dL (ref 6.5–8.1)

## 2018-09-03 LAB — CBC
HCT: 33.6 % — ABNORMAL LOW (ref 39.0–52.0)
Hemoglobin: 10.4 g/dL — ABNORMAL LOW (ref 13.0–17.0)
MCH: 32.1 pg (ref 26.0–34.0)
MCHC: 31 g/dL (ref 30.0–36.0)
MCV: 103.7 fL — ABNORMAL HIGH (ref 80.0–100.0)
Platelets: 58 10*3/uL — ABNORMAL LOW (ref 150–400)
RBC: 3.24 MIL/uL — ABNORMAL LOW (ref 4.22–5.81)
RDW: 15.6 % — ABNORMAL HIGH (ref 11.5–15.5)
WBC: 4 10*3/uL (ref 4.0–10.5)
nRBC: 0 % (ref 0.0–0.2)

## 2018-09-03 LAB — CBG MONITORING, ED
Glucose-Capillary: 71 mg/dL (ref 70–99)
Glucose-Capillary: 73 mg/dL (ref 70–99)

## 2018-09-03 MED ORDER — GABAPENTIN 300 MG PO CAPS
300.0000 mg | ORAL_CAPSULE | Freq: Once | ORAL | Status: AC
  Administered 2018-09-03: 300 mg via ORAL
  Filled 2018-09-03: qty 1

## 2018-09-03 MED ORDER — HYDROCODONE-ACETAMINOPHEN 5-325 MG PO TABS
1.0000 | ORAL_TABLET | Freq: Once | ORAL | Status: AC
  Administered 2018-09-03: 14:00:00 1 via ORAL
  Filled 2018-09-03: qty 1

## 2018-09-03 MED ORDER — ASPIRIN 81 MG PO CHEW
243.0000 mg | CHEWABLE_TABLET | Freq: Once | ORAL | Status: AC
  Administered 2018-09-03: 243 mg via ORAL
  Filled 2018-09-03: qty 3

## 2018-09-03 MED ORDER — SODIUM CHLORIDE 0.9% FLUSH: 3.0000 mL | Freq: Once | INTRAVENOUS | Status: DC

## 2018-09-03 NOTE — ED Notes (Addendum)
Pt discharge summary reviewed with pt. Pt verbalized understanding of instructions. Pt unable to sign due to signature pad not coming on. Pt discharged via [facility].

## 2018-09-03 NOTE — ED Triage Notes (Signed)
Patient presents to the ED by EMS from dialysis. Per staff the pt had a change in his mental status after treatment. Denies LOC. He is not reporting chest pain upon arrival, abdominal distention x1 week and issues with right arm graft. V-paced on EKG. Received full treatment at dialysis today.

## 2018-09-03 NOTE — ED Notes (Signed)
Patient transported to X-ray 

## 2018-09-03 NOTE — ED Notes (Signed)
CBG collected. Result "40." MD, Starnes, notified.

## 2018-09-03 NOTE — ED Provider Notes (Signed)
[facility]ERG[facility]Y DEPARTMENT Provider Note   CSN: [id number] Arrival date & time: 09/03/18  1223    History   Chief Complaint Chief Complaint  Patient presents with  . Altered Mental Status  . Chest Pain  . Abdominal Pain    HPI Eric [name] is a 63 y.o. male.     HPI  Here from dialysis due to complaint of pain. He states he had some shortness of breath this morning but otherwise felt fine.  Completed most of his dialysis session but had rather abrupt onset of global pain.  He describes it as sharp and achy from his head to his feet.  Endorses some continued shortness of breath.  Denies that the pain is worse in any particular region.  Denies alleviating or exacerbating factors or that he has experienced similar symptoms with dialysis before.  Reports compliance with his regimen of Tuesday Thursday Saturday and has been taking his medications regularly.  Past Medical History:  Diagnosis Date  . AICD (automatic cardioverter/defibrillator) present   . Cardiac arrest ([place]) 03/07/2013  . Cardiac arrest due to underlying cardiac condition ([place])   . CHF (congestive heart failure) ([place])   . CKD (chronic kidney disease), stage III ([place])   . Complication of anesthesia    got too much anesthesia and bp dropped very low Feb, 2019  . Edema   . ESRD on dialysis ([place])   . History of blood transfusion 1950's   "related to pinal menigitis; HAD 16 OPERATIONS TOTAL"  . Hyperlipemia   . Hypertension   . Ileus ([place]) 05/2015  . Myocardial infarction ([place]) 03/2013  . Pain in the chest   . Pneumonia 2016  . Rash and nonspecific skin eruption 06/29/2014  . Scabies infestation 06/29/2014  . Shortness of breath dyspnea   . Type II diabetes mellitus ([place])    type 2  . Ventricular tachycardia [facility])     Patient Active Problem List   Diagnosis Date Noted  . Atypical chest pain 07/30/2018  . S/P CABG (coronary artery bypass graft) 04/01/2018  . Claudication ([place])  04/01/2018  . Chronic diastolic (congestive) heart failure ([place]) 04/01/2018  . Paresthesia 12/07/2015  . BPH (benign prostatic hyperplasia)   . Atherosclerosis of native coronary artery of native heart without angina pectoris 10/01/2015  . Chronic systolic (congestive) heart failure ([place]) 10/01/2015  . Encounter for fitting or adjustment of implantable cardioverter-defibrillator (ICD)   . Ileus, postoperative ([place])   . Impaired nasal gastric feeding tube   . Impaired nasogastric feeding tube   . Ileus ([place])   . ESRD (end stage renal disease) ([place])   . Gallstones   . Gallbladder calculus 06/15/2015  . Numbness   . RUQ abdominal pain   . CHF exacerbation ([place]) 03/10/2015  . Heart failure, acute on chronic, systolic and diastolic ([place]) 49/70/2637  . Acute on chronic congestive heart failure ([place])   . Diabetes mellitus with complication ([place])   . CHF (congestive heart failure) ([place]) 08/03/2014  . Hypertensive urgency 08/03/2014  . Controlled type 2 diabetes with neuropathy ([place]) 08/03/2014  . Obesity (BMI 30-39.9) 08/03/2014  . Bilateral leg pain 08/03/2014  . Acute on chronic combined systolic and diastolic CHF (congestive heart failure) ([place]) 07/02/2014  . CKD (chronic kidney disease) stage 3, GFR 30-59 ml/min (HCC) 06/29/2014  . DM (diabetes mellitus), type 2 with renal complications ([place]) 85/88/5027  . 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 Essential hypertension, benign  06/29/2014  . Hepatic cirrhosis ([place]) 11/28/2013  . Cardiomyopathy, ischemic 11/28/2013  . Hepatitis C antibody test positive 11/28/2013  . Cirrhosis ([place])   . Type 2 diabetes mellitus with hyperglycemia ([place])   . Diabetes mellitus with hypoglycemia ([place]) 04/29/2013  . HCAP (healthcare-associated pneumonia) 04/28/2013  . DM type 2 (diabetes mellitus, type 2) ([place]) 04/28/2013  . Hyperlipemia   . Ventricular fibrillation ([place]) 03/08/2013  . Seizure  ([place]) 03/07/2013    Past Surgical History:  Procedure Laterality Date  . AV FISTULA PLACEMENT Right 03/18/2015   Procedure: BRACHIOCEPHALIC ARTERIOVENOUS (AV) FISTULA CREATION;  Surgeon: Angelia Mould, MD;  Location: [facility];  Service: Vascular;  Laterality: Right;  . BACK SURGERY  1950's   "for spinal menigitis; HAD 16 OPERATIONS TOTAL"  . CARDIAC CATHETERIZATION    . CHOLECYSTECTOMY N/A 06/17/2015   Procedure: LAPAROSCOPIC CHOLECYSTECTOMY;  Surgeon: Coralie Keens, MD;  Location: [facility];  Service: General;  Laterality: N/A;  . CORONARY ARTERY BYPASS GRAFT  1999   cabg x4  . 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 HEAD SURGERY  1950'S   "for spinal menigitis; HAD 16 OPERATIONS TOTAL"  . IMPLANTABLE CARDIOVERTER DEFIBRILLATOR IMPLANT  03/24/2013   STJ single chamber ICD implanted by Dr Caryl Comes for secondary prevention  . IMPLANTABLE CARDIOVERTER DEFIBRILLATOR IMPLANT N/A 03/24/2013   Procedure: IMPLANTABLE CARDIOVERTER DEFIBRILLATOR IMPLANT;  Surgeon: Deboraha Sprang, MD;  Location: [facility] CATH LAB;  Service: Cardiovascular;  Laterality: N/A;  . INSERTION OF DIALYSIS CATHETER N/A 03/13/2015   Procedure: INSERTION OF TUNNELED DIALYSIS CATHETER;  Surgeon: Conrad South Temple, MD;  Location: [facility];  Service: Vascular;  Laterality: N/A;  . IR PARACENTESIS  08/20/2018  . LEFT HEART CATHETERIZATION WITH CORONARY/GRAFT ANGIOGRAM  03/07/2013   Procedure: LEFT HEART CATHETERIZATION WITH [name];  Surgeon: Clent Demark, MD;  Location[facility] CATH LAB;  Service: Cardiovascular;;  . REVISON OF ARTERIOVENOUS FISTULA Right 01/03/2018   Procedure: REVISION OF ARTERIOVENOUS FISTULA;  Surgeon: Waynetta Sandy, MD;  Location: [facility];  Service: Vascular;  Laterality: Right;  . UMBILICAL HERNIA REPAIR N/A 06/24/2015   Procedure: HERNIA REPAIR UMBILICAL ADULT;  Surgeon: Georganna Skeans, MD;  Location: [facility];  Service: General;  Laterality: N/A;        Home  Medications    Prior to Admission medications   Medication Sig Start Date End Date Taking? Authorizing Provider  acetaminophen (TYLENOL) 325 MG tablet Take 2 tablets (650 mg total) by mouth every 6 (six) hours as needed for mild pain or moderate pain. 07/01/15   Smiley Houseman, MD  amLODipine (NORVASC) 10 MG tablet Take 10 mg by mouth daily. 05/23/18   [provider]  aspirin EC 81 MG tablet Take 81 mg by mouth daily.     [provider]  benzonatate (TESSALON) 100 MG capsule Take 1 capsule (100 mg total) by mouth 3 (three) times daily as needed for cough. 07/31/18   Welford Roche, MD  calcium acetate (PHOSLO) 667 MG capsule Take 667 mg by mouth 3 (three) times daily with meals.     [provider]  cinacalcet (SENSIPAR) 30 MG tablet Take 30 mg by mouth daily with supper.    [provider]  furosemide (LASIX) 80 MG tablet Take 1 tablet (80 mg total) by mouth every morning. Do not take on days of dialysis 07/17/18 10/15/18  Adrian Prows, MD  gabapentin (NEURONTIN) 300 MG capsule Take 1 capsule (300 mg  total) by mouth 2 (two) times daily. 04/01/18   Miquel Dunn, NP  hydrALAZINE (APRESOLINE) 10 MG tablet Take 10 mg by mouth daily as needed (anxiety).    [provider]  isosorbide mononitrate (IMDUR) 60 MG 24 hr tablet Take 60 mg by mouth as needed (Heart Rate).     [provider]  metoprolol tartrate (LOPRESSOR) 25 MG tablet Take 1 tablet (25 mg total) by mouth 2 (two) times daily. 05/29/18 08/27/18  Adrian Prows, MD  multivitamin (RENA-VIT) TABS tablet Take 1 tablet by mouth at bedtime. 07/01/15   Smiley Houseman, MD  pantoprazole (PROTONIX) 40 MG tablet Take 40 mg by mouth as needed (acid reflux).     [provider]  rosuvastatin (CRESTOR) 20 MG tablet Take 1 tablet (20 mg total) by mouth daily at 6 PM. 01/05/18   Kayleen Memos, DO    Family History Family History  Problem Relation Age of Onset  . Heart disease  Mother   . Diabetes Mother     Social History Social History   Tobacco Use  . Smoking status: Former Smoker    Packs/day: 1.00    Years: 5.00    Pack years: 5.00    Types: Cigarettes  . Smokeless tobacco: Never Used  Substance Use Topics  . Alcohol use: No    Alcohol/week: 0.0 standard drinks  . Drug use: No     Allergies   Patient has no known allergies.   Review of Systems Review of Systems  Respiratory: Positive for shortness of breath.   Gastrointestinal: Positive for abdominal distention.  Musculoskeletal: Positive for myalgias.  All other systems reviewed and are negative.    Physical Exam Updated Vital Signs BP 110/72   Pulse 69   Temp 98.1 F (36.7 C) (Oral)   Resp (!) 21   SpO2 98%   Physical Exam Vitals signs and nursing note reviewed.  Constitutional:      Appearance: He is well-developed.     Comments: Appears chronically unwell but not acutely ill  HENT:     Head: Normocephalic and atraumatic.  Eyes:     Conjunctiva/sclera: Conjunctivae normal.  Neck:     Musculoskeletal: Neck supple.  Cardiovascular:     Rate and Rhythm: Normal rate and regular rhythm.     Heart sounds: No murmur.  Pulmonary:     Effort: Pulmonary effort is normal. No respiratory distress.     Comments: Diffuse fine crackles in the lower lobes Abdominal:     Palpations: Abdomen is soft. There is fluid wave.     Tenderness: There is no abdominal tenderness.  Musculoskeletal:     Comments: +1 pitting edema bilaterally  Skin:    General: Skin is warm and dry.  Neurological:     Mental Status: He is alert and oriented to person, place, and time.  Psychiatric:        Mood and Affect: Mood normal.      ED Treatments / Results  Labs (all labs ordered are listed, but only abnormal results are displayed) Labs Reviewed  COMPREHENSIVE METABOLIC PANEL - Abnormal; Notable for the following components:      Result Value   Chloride 93 (*)    CO2 33 (*)    Creatinine,  Ser 5.11 (*)    Calcium 8.7 (*)    Albumin 3.2 (*)    GFR calc non Af Amer 11 (*)    GFR calc Af Amer 13 (*)  All other components within normal limits  CBC - Abnormal; Notable for the following components:   RBC 3.24 (*)    Hemoglobin 10.4 (*)    HCT 33.6 (*)    MCV 103.7 (*)    RDW 15.6 (*)    Platelets 58 (*)    All other components within normal limits  CALCIUM, IONIZED  CBG MONITORING, ED  CBG MONITORING, ED    EKG EKG Interpretation  Date/Time:  Tuesday September 03 2018 12:46:50 EDT Ventricular Rate:  70 PR Interval:    QRS Duration: 178 QT Interval:  514 QTC Calculation: 555 R Axis:   -81 Text Interpretation:  Electronic ventricular pacemaker otherwise no significant change since July 2020 Confirmed by Sherwood Gambler [id number]) on 09/03/2018 1:16:24 PM   Radiology Dg Chest 2 View  Result Date: 09/03/2018 CLINICAL DATA:  Abdominal distension and chest pain EXAM: CHEST - 2 VIEW COMPARISON:  Multiple priors, most recent radiograph 08/03/2018 FINDINGS: Single lead battery pack overlies the left chest wall with the defibrillation lead directed towards the cardiac apex. There is mild cardiomegaly. Central vascular cuffing is present with cephalized, indistinct pulmonary vascularity few septal lines and fissural thickening. No consolidation, pneumothorax or effusion. Postsurgical changes related to prior CABG including intact and aligned sternotomy wires and multiple surgical clips projecting over the mediastinum. No acute osseous or soft tissue abnormality. EKG leads overlie the chest. IMPRESSION: Cardiomegaly with mild interstitial pulmonary edema. Electronically Signed   By: Lovena Le M.D.   On: 09/03/2018 14:04    Procedures Procedures (including critical care time)  Medications Ordered in ED Medications  sodium chloride flush (NS) 0.9 % injection 3 mL (3 mLs Intravenous Not Given 09/03/18 1316)  aspirin chewable tablet 243 mg (243 mg Oral Given 09/03/18 1321)   HYDROcodone-acetaminophen (NORCO/VICODIN) 5-325 MG per tablet 1 tablet (1 tablet Oral Given 09/03/18 1428)  gabapentin (NEURONTIN) capsule 300 mg (300 mg Oral Given 09/03/18 1656)     Initial Impression / Assessment and Plan / ED Course  I have reviewed the triage vital signs and the nursing notes.  Pertinent labs & imaging results that were available during my care of the patient were reviewed by me and considered in my medical decision making (see chart for details).        Mr. 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  I reviewed his medical record.  Notably, he has presented multiple times in the past for similar complaints after or during dialysis when he experiences onset of fatigue, malaise, or diffuse pain.  Work-up is notable for mild hypochloremia at 93 and elevated bicarb at 33, likely due to volume shifts from dialysis.  Redemonstrated ESRD.  Calcium level 8.7.  Mild hypoalbuminemia 3.2.  He has redemonstrated macrocytic anemia with a hemoglobin 10.4.  All in all, findings are consistent with his baseline.  Point-of-care glucose was 71 on arrival and 73 prior to discharge.  He was given glucose containing liquids to avoid it going lower while awaiting transport.  Chest x-ray showed mild interstitial edema, which is not unexpected given exam and history.  Overall the work-up is negative for anything to explain his diffuse pain.  He was given 1 Norco on arrival to to his complaints as well as an aspirin.  When work-up was negative for signs of infection, ischemia, or other process to explain  his pain, I declined to give additional medications with the exception of gabapentin as he takes this at home.  He is requesting admission for continued observation.  I reviewed with him that admission is not  indicated simply for the observation of pain related to dialysis, which he has a clear history of.  I reviewed these findings with his wife who vocalized that he frequently complains of similar symptoms but is very stable at home and that he has adequate care and supervision there and that she has not noticed anything recently to indicate a need for admission.  Overall the patient is very stable and appropriate for discharge.  Gave strict return precautions and aftercare instructions including that he should follow-up with his long-term providers regarding possible adjustment of his dialysis regimen so that is more tolerable to him.  Final Clinical Impressions(s) / ED Diagnoses   Final diagnoses:  Generalized pain  Complication of renal dialysis, sequela    ED Discharge Orders    None       Tillie Fantasia, MD 09/03/18 1710    Sherwood Gambler, MD 09/04/18 0710

## 2018-09-04 LAB — CALCIUM, IONIZED: Calcium, Ionized, Serum: 4.3 mg/dL — ABNORMAL LOW (ref 4.5–5.6)

## 2018-09-11 ENCOUNTER — Encounter (HOSPITAL_COMMUNITY): Payer: Self-pay | Admitting: Student

## 2018-09-11 ENCOUNTER — Ambulatory Visit (HOSPITAL_COMMUNITY)
Admission: RE | Admit: 2018-09-11 | Discharge: 2018-09-11 | Disposition: A | Discharge status: Home / Self Care | Payer: Medicare Other | Source: Ambulatory Visit | Attending: Internal Medicine | Admitting: Internal Medicine

## 2018-09-11 ENCOUNTER — Other Ambulatory Visit: Payer: Self-pay

## 2018-09-11 ENCOUNTER — Ambulatory Visit (HOSPITAL_COMMUNITY): Payer: Medicare Other

## 2018-09-11 DIAGNOSIS — B1920 Unspecified viral hepatitis C without hepatic coma: Secondary | ICD-10-CM | POA: Insufficient documentation

## 2018-09-11 DIAGNOSIS — R188 Other ascites: Secondary | ICD-10-CM

## 2018-09-11 HISTORY — PX: IR PARACENTESIS: IMG2679

## 2018-09-11 LAB — GLUCOSE, CAPILLARY: Glucose-Capillary: 99 mg/dL (ref 70–99)

## 2018-09-11 MED ORDER — LIDOCAINE HCL 1 % IJ SOLN
INTRAMUSCULAR | Status: AC
  Filled 2018-09-11: qty 20

## 2018-09-11 MED ORDER — LIDOCAINE HCL (PF) 1 % IJ SOLN
INTRAMUSCULAR | Status: DC | PRN
  Administered 2018-09-11: 5 mL

## 2018-09-11 NOTE — Procedures (Addendum)
   US guided RLQ paracentesis  3 L yellow fluid obtained No labs per MD  Tolerated well   ADDENDUM:  Called to RN station Pt feeling a little dizzy No syncope No pain  BP 130s/80s Glucose 99  Feeling better after sitting a while Snack PB crackers given to pt and short bread cookies  Wife in room DC to home

## 2018-09-12 ENCOUNTER — Other Ambulatory Visit: Payer: Self-pay

## 2018-09-12 ENCOUNTER — Inpatient Hospital Stay (HOSPITAL_COMMUNITY)
Admission: EM | Admit: 2018-09-12 | Discharge: 2018-09-23 | DRG: 270 | Disposition: A | Discharge status: Home Health | Payer: Medicare Other | Attending: Internal Medicine | Admitting: Internal Medicine

## 2018-09-12 ENCOUNTER — Encounter (HOSPITAL_COMMUNITY): Payer: Self-pay

## 2018-09-12 ENCOUNTER — Emergency Department (HOSPITAL_COMMUNITY): Payer: Medicare Other

## 2018-09-12 DIAGNOSIS — Z79899 Other long term (current) drug therapy: Secondary | ICD-10-CM

## 2018-09-12 DIAGNOSIS — D631 Anemia in chronic kidney disease: Secondary | ICD-10-CM | POA: Diagnosis present

## 2018-09-12 DIAGNOSIS — I252 Old myocardial infarction: Secondary | ICD-10-CM

## 2018-09-12 DIAGNOSIS — I5043 Acute on chronic combined systolic (congestive) and diastolic (congestive) heart failure: Secondary | ICD-10-CM | POA: Diagnosis present

## 2018-09-12 DIAGNOSIS — R188 Other ascites: Secondary | ICD-10-CM | POA: Diagnosis present

## 2018-09-12 DIAGNOSIS — T82898A Other specified complication of vascular prosthetic devices, implants and grafts, initial encounter: Principal | ICD-10-CM | POA: Diagnosis present

## 2018-09-12 DIAGNOSIS — N186 End stage renal disease: Secondary | ICD-10-CM | POA: Diagnosis present

## 2018-09-12 DIAGNOSIS — K219 Gastro-esophageal reflux disease without esophagitis: Secondary | ICD-10-CM | POA: Diagnosis present

## 2018-09-12 DIAGNOSIS — E1122 Type 2 diabetes mellitus with diabetic chronic kidney disease: Secondary | ICD-10-CM | POA: Diagnosis present

## 2018-09-12 DIAGNOSIS — I951 Orthostatic hypotension: Secondary | ICD-10-CM | POA: Diagnosis present

## 2018-09-12 DIAGNOSIS — Z20828 Contact with and (suspected) exposure to other viral communicable diseases: Secondary | ICD-10-CM | POA: Diagnosis present

## 2018-09-12 DIAGNOSIS — Z7982 Long term (current) use of aspirin: Secondary | ICD-10-CM

## 2018-09-12 DIAGNOSIS — E871 Hypo-osmolality and hyponatremia: Secondary | ICD-10-CM | POA: Diagnosis present

## 2018-09-12 DIAGNOSIS — I132 Hypertensive heart and chronic kidney disease with heart failure and with stage 5 chronic kidney disease, or end stage renal disease: Secondary | ICD-10-CM | POA: Diagnosis present

## 2018-09-12 DIAGNOSIS — J810 Acute pulmonary edema: Secondary | ICD-10-CM

## 2018-09-12 DIAGNOSIS — Y832 Surgical operation with anastomosis, bypass or graft as the cause of abnormal reaction of the patient, or of later complication, without mention of misadventure at the time of the procedure: Secondary | ICD-10-CM | POA: Diagnosis present

## 2018-09-12 DIAGNOSIS — Z8249 Family history of ischemic heart disease and other diseases of the circulatory system: Secondary | ICD-10-CM

## 2018-09-12 DIAGNOSIS — Z87891 Personal history of nicotine dependence: Secondary | ICD-10-CM

## 2018-09-12 DIAGNOSIS — R079 Chest pain, unspecified: Secondary | ICD-10-CM | POA: Diagnosis present

## 2018-09-12 DIAGNOSIS — J9601 Acute respiratory failure with hypoxia: Secondary | ICD-10-CM | POA: Diagnosis present

## 2018-09-12 DIAGNOSIS — Z992 Dependence on renal dialysis: Secondary | ICD-10-CM

## 2018-09-12 DIAGNOSIS — K7460 Unspecified cirrhosis of liver: Secondary | ICD-10-CM | POA: Diagnosis present

## 2018-09-12 DIAGNOSIS — B957 Other staphylococcus as the cause of diseases classified elsewhere: Secondary | ICD-10-CM | POA: Diagnosis present

## 2018-09-12 DIAGNOSIS — Z95810 Presence of automatic (implantable) cardiac defibrillator: Secondary | ICD-10-CM

## 2018-09-12 DIAGNOSIS — R0902 Hypoxemia: Secondary | ICD-10-CM

## 2018-09-12 DIAGNOSIS — Z9049 Acquired absence of other specified parts of digestive tract: Secondary | ICD-10-CM

## 2018-09-12 DIAGNOSIS — N2581 Secondary hyperparathyroidism of renal origin: Secondary | ICD-10-CM | POA: Diagnosis present

## 2018-09-12 DIAGNOSIS — R7881 Bacteremia: Secondary | ICD-10-CM | POA: Diagnosis present

## 2018-09-12 DIAGNOSIS — I10 Essential (primary) hypertension: Secondary | ICD-10-CM | POA: Diagnosis present

## 2018-09-12 DIAGNOSIS — E1129 Type 2 diabetes mellitus with other diabetic kidney complication: Secondary | ICD-10-CM | POA: Diagnosis present

## 2018-09-12 DIAGNOSIS — E785 Hyperlipidemia, unspecified: Secondary | ICD-10-CM | POA: Diagnosis present

## 2018-09-12 DIAGNOSIS — Z8674 Personal history of sudden cardiac arrest: Secondary | ICD-10-CM

## 2018-09-12 DIAGNOSIS — D6959 Other secondary thrombocytopenia: Secondary | ICD-10-CM | POA: Diagnosis present

## 2018-09-12 DIAGNOSIS — Z833 Family history of diabetes mellitus: Secondary | ICD-10-CM

## 2018-09-12 DIAGNOSIS — G253 Myoclonus: Secondary | ICD-10-CM | POA: Diagnosis present

## 2018-09-12 DIAGNOSIS — Z951 Presence of aortocoronary bypass graft: Secondary | ICD-10-CM

## 2018-09-12 DIAGNOSIS — E878 Other disorders of electrolyte and fluid balance, not elsewhere classified: Secondary | ICD-10-CM | POA: Diagnosis present

## 2018-09-12 LAB — CBC
HCT: 31.1 % — ABNORMAL LOW (ref 39.0–52.0)
Hemoglobin: 9.5 g/dL — ABNORMAL LOW (ref 13.0–17.0)
MCH: 31.7 pg (ref 26.0–34.0)
MCHC: 30.5 g/dL (ref 30.0–36.0)
MCV: 103.7 fL — ABNORMAL HIGH (ref 80.0–100.0)
Platelets: 68 10*3/uL — ABNORMAL LOW (ref 150–400)
RBC: 3 MIL/uL — ABNORMAL LOW (ref 4.22–5.81)
RDW: 15.3 % (ref 11.5–15.5)
WBC: 3.9 10*3/uL — ABNORMAL LOW (ref 4.0–10.5)
nRBC: 0 % (ref 0.0–0.2)

## 2018-09-12 LAB — BASIC METABOLIC PANEL
Anion gap: 12 (ref 5–15)
BUN: 13 mg/dL (ref 8–23)
CO2: 36 mmol/L — ABNORMAL HIGH (ref 22–32)
Calcium: 8.4 mg/dL — ABNORMAL LOW (ref 8.9–10.3)
Chloride: 92 mmol/L — ABNORMAL LOW (ref 98–111)
Creatinine, Ser: 4.7 mg/dL — ABNORMAL HIGH (ref 0.61–1.24)
GFR calc Af Amer: 14 mL/min — ABNORMAL LOW (ref >60)
GFR calc non Af Amer: 12 mL/min — ABNORMAL LOW (ref >60)
Glucose, Bld: 94 mg/dL (ref 70–99)
Potassium: 3.8 mmol/L (ref 3.5–5.1)
Sodium: 140 mmol/L (ref 135–145)

## 2018-09-12 LAB — TROPONIN I (HIGH SENSITIVITY): Troponin I (High Sensitivity): 38 ng/L — ABNORMAL HIGH (ref <18)

## 2018-09-12 MED ORDER — ONDANSETRON HCL 4 MG/2ML IJ SOLN
4.0000 mg | Freq: Once | INTRAMUSCULAR | Status: AC
  Administered 2018-09-12: 4 mg via INTRAVENOUS
  Filled 2018-09-12: qty 2

## 2018-09-12 MED ORDER — FENTANYL CITRATE (PF) 100 MCG/2ML IJ SOLN
50.0000 ug | Freq: Once | INTRAMUSCULAR | Status: AC
  Administered 2018-09-12: 50 ug via INTRAVENOUS
  Filled 2018-09-12: qty 2

## 2018-09-12 NOTE — ED Notes (Signed)
During triage, pt noted to exhibit seizure like activity w generalized tonic clonic shaking lasting about 30 seconds. Unknown if pt postictal d/t initial altered mental status. Airway intact, sats 98% after seizure. Pt moved to treatment room.

## 2018-09-12 NOTE — ED Provider Notes (Signed)
[facility] EMERGENCY DEPARTMENT Provider Note   CSN: [id number] Arrival date & time: 09/12/18  2139     History   Chief Complaint Chief Complaint  Patient presents with  . Chest Pain    HPI Eric [name] is a 63 y.o. male.     Patient with history of ESRD-HD (T, Th, S), AICD, CAD, HTN, HLD, T2DM, CHF, Hep C, cared for at home by his wife, presents for evaluation of pain in his chest, arms, legs, back, including facial pressure that started tonight. Per patient's wife, her concern was chest pain that started around 9:00 pm. She feels his symptoms are related to problems with GERD. She has given him Tylenol, neurontin, and medications for reflux without change. The patient reports his pain is more generalized, associated with SOB. He endorses nausea but states this occurs frequently. No recent fever. He has a chronic cough that is unchanged tonight.   The history is provided by the patient and the spouse. No language interpreter was used.  Chest Pain Associated symptoms: abdominal pain, back pain and nausea   Associated symptoms: no diaphoresis, no fever and no vomiting     Past Medical History:  Diagnosis Date  . AICD (automatic cardioverter/defibrillator) present   . Cardiac arrest ([place]) 03/07/2013  . Cardiac arrest due to underlying cardiac condition ([place])   . CHF (congestive heart failure) ([place])   . CKD (chronic kidney disease), stage III ([place])   . Complication of anesthesia    got too much anesthesia and bp dropped very low Feb, 2019  . Edema   . ESRD on dialysis ([place])   . History of blood transfusion 1950's   "related to pinal menigitis; HAD 16 OPERATIONS TOTAL"  . Hyperlipemia   . Hypertension   . Ileus ([place]) 05/2015  . Myocardial infarction ([place]) 03/2013  . Pain in the chest   . Pneumonia 2016  . Rash and nonspecific skin eruption 06/29/2014  . Scabies infestation 06/29/2014  . Shortness of breath dyspnea   . Type II diabetes mellitus ([place])    type 2  . Ventricular tachycardia [facility])     Patient Active Problem List   Diagnosis Date Noted  . Atypical chest pain 07/30/2018  . S/P CABG (coronary artery bypass graft) 04/01/2018  . Claudication ([place]) 04/01/2018  . Chronic diastolic (congestive) heart failure ([place]) 04/01/2018  . Paresthesia 12/07/2015  . BPH (benign prostatic hyperplasia)   . Atherosclerosis of native coronary artery of native heart without angina pectoris 10/01/2015  . Chronic systolic (congestive) heart failure ([place]) 10/01/2015  . Encounter for fitting or adjustment of implantable cardioverter-defibrillator (ICD)   . Ileus, postoperative ([place])   . Impaired nasal gastric feeding tube   . Impaired nasogastric feeding tube   . Ileus ([place])   . ESRD (end stage renal disease) ([place])   . Gallstones   . Gallbladder calculus 06/15/2015  . Numbness   . RUQ abdominal pain   . CHF exacerbation ([place]) 03/10/2015  . Heart failure, acute on chronic, systolic and diastolic ([place]) 80/99/8338  . Acute on chronic congestive heart failure ([place])   . Diabetes mellitus with complication ([place])   . CHF (congestive heart failure) ([place]) 08/03/2014  . Hypertensive urgency 08/03/2014  . Controlled type 2 diabetes with neuropathy ([place]) 08/03/2014  . Obesity (BMI 30-39.9) 08/03/2014  . Bilateral leg pain 08/03/2014  . Acute on chronic combined systolic and diastolic CHF (congestive heart failure) ([place]) 07/02/2014  . CKD (chronic kidney disease) stage 3,  GFR 30-59 ml/min ([place]) 06/29/2014  . DM (diabetes mellitus), type 2 with renal complications ([place]) 93/79/0240  . 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 Essential hypertension, benign 06/29/2014  . Hepatic cirrhosis ([place]) 11/28/2013  . Cardiomyopathy, ischemic 11/28/2013  . Hepatitis C antibody test positive 11/28/2013  . Cirrhosis ([place])   . Type 2 diabetes mellitus with hyperglycemia ([place])   . Diabetes mellitus with  hypoglycemia ([place]) 04/29/2013  . HCAP (healthcare-associated pneumonia) 04/28/2013  . DM type 2 (diabetes mellitus, type 2) ([place]) 04/28/2013  . Hyperlipemia   . Ventricular fibrillation ([place]) 03/08/2013  . Seizure ([place]) 03/07/2013    Past Surgical History:  Procedure Laterality Date  . AV FISTULA PLACEMENT Right 03/18/2015   Procedure: BRACHIOCEPHALIC ARTERIOVENOUS (AV) FISTULA CREATION;  Surgeon: Angelia Mould, MD;  Location: [place];  Service: Vascular;  Laterality: Right;  . BACK SURGERY  1950's   "for spinal menigitis; HAD 16 OPERATIONS TOTAL"  . CARDIAC CATHETERIZATION    . CHOLECYSTECTOMY N/A 06/17/2015   Procedure: LAPAROSCOPIC CHOLECYSTECTOMY;  Surgeon: Coralie Keens, MD;  Location: [place];  Service: General;  Laterality: N/A;  . CORONARY ARTERY BYPASS GRAFT  1999   cabg x4  . 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 HEAD SURGERY  1950'S   "for spinal menigitis; HAD 16 OPERATIONS TOTAL"  . IMPLANTABLE CARDIOVERTER DEFIBRILLATOR IMPLANT  03/24/2013   STJ single chamber ICD implanted by Dr Caryl Comes for secondary prevention  . IMPLANTABLE CARDIOVERTER DEFIBRILLATOR IMPLANT N/A 03/24/2013   Procedure: IMPLANTABLE CARDIOVERTER DEFIBRILLATOR IMPLANT;  Surgeon: Deboraha Sprang, MD;  Location: [facility] CATH LAB;  Service: Cardiovascular;  Laterality: N/A;  . INSERTION OF DIALYSIS CATHETER N/A 03/13/2015   Procedure: INSERTION OF TUNNELED DIALYSIS CATHETER;  Surgeon: Conrad Stewart, MD;  Location: [place];  Service: Vascular;  Laterality: N/A;  . IR PARACENTESIS  08/20/2018  . IR PARACENTESIS  09/11/2018  . LEFT HEART CATHETERIZATION WITH CORONARY/GRAFT ANGIOGRAM  03/07/2013   Procedure: LEFT HEART CATHETERIZATION WITH [name];  Surgeon: Clent Demark, MD;  Location[facility];  Service: Cardiovascular;;  . REVISON OF ARTERIOVENOUS FISTULA Right 01/03/2018   Procedure: REVISION OF ARTERIOVENOUS FISTULA;  Surgeon: Waynetta Sandy, MD;   Location: [place];  Service: Vascular;  Laterality: Right;  . UMBILICAL HERNIA REPAIR N/A 06/24/2015   Procedure: HERNIA REPAIR UMBILICAL ADULT;  Surgeon: Georganna Skeans, MD;  Location: [place];  Service: General;  Laterality: N/A;        Home Medications    Prior to Admission medications   Medication Sig Start Date End Date Taking? Authorizing Provider  acetaminophen (TYLENOL) 325 MG tablet Take 2 tablets (650 mg total) by mouth every 6 (six) hours as needed for mild pain or moderate pain. 07/01/15  Yes Smiley Houseman, MD  amLODipine (NORVASC) 10 MG tablet Take 10 mg by mouth daily. 05/23/18  Yes [provider]  aspirin EC 81 MG tablet Take 81 mg by mouth daily.    Yes [provider]  calcium acetate (PHOSLO) 667 MG capsule Take 667 mg by mouth 3 (three) times daily with meals.    Yes [provider]  cinacalcet (SENSIPAR) 30 MG tablet Take 30 mg by mouth daily with supper.   Yes [provider]  furosemide (LASIX) 80 MG tablet Take 1 tablet (80 mg total) by mouth every morning. Do not take on days of dialysis 07/17/18 10/15/18 Yes Ganji,  Ulice Dash, MD  gabapentin (NEURONTIN) 300 MG capsule Take 1 capsule (300 mg total) by mouth 2 (two) times daily. 04/01/18  Yes Miquel Dunn, NP  hydrALAZINE (APRESOLINE) 10 MG tablet Take 10 mg by mouth daily as needed (anxiety).   Yes [provider]  isosorbide mononitrate (IMDUR) 60 MG 24 hr tablet Take 60 mg by mouth as needed (Heart Rate).    Yes [provider]  metoprolol tartrate (LOPRESSOR) 25 MG tablet Take 1 tablet (25 mg total) by mouth 2 (two) times daily. 05/29/18 09/12/18 Yes Adrian Prows, MD  multivitamin (RENA-VIT) TABS tablet Take 1 tablet by mouth at bedtime. 07/01/15  Yes Smiley Houseman, MD  pantoprazole (PROTONIX) 40 MG tablet Take 40 mg by mouth as needed (acid reflux).    Yes [provider]  rosuvastatin (CRESTOR) 20 MG tablet Take 1 tablet (20 mg total) by mouth daily at 6  PM. 01/05/18  Yes Hall, Carole N, DO  benzonatate (TESSALON) 100 MG capsule Take 1 capsule (100 mg total) by mouth 3 (three) times daily as needed for cough. Patient not taking: Reported on 09/12/2018 07/31/18   Welford Roche, MD    Family History Family History  Problem Relation Age of Onset  . Heart disease Mother   . Diabetes Mother     Social History Social History   Tobacco Use  . Smoking status: Former Smoker    Packs/day: 1.00    Years: 5.00    Pack years: 5.00    Types: Cigarettes  . Smokeless tobacco: Never Used  Substance Use Topics  . Alcohol use: No    Alcohol/week: 0.0 standard drinks  . Drug use: No     Allergies   Patient has no known allergies.   Review of Systems Review of Systems  Constitutional: Negative for chills, diaphoresis and fever.  HENT: Positive for sinus pressure.   Respiratory: Negative.   Cardiovascular: Positive for chest pain.  Gastrointestinal: Positive for abdominal pain and nausea. Negative for vomiting.  Musculoskeletal: Positive for back pain.       See HPI.  Skin: Negative.   Neurological: Negative.      Physical Exam Updated Vital Signs BP (!) 154/77   Pulse 75   Resp (!) 23   Ht 5\' 6"  (1.676 m)   Wt 81.6 kg   SpO2 91%   BMI 29.05 kg/m   Physical Exam Vitals signs and nursing note reviewed.  Constitutional:      Appearance: He is well-developed.  HENT:     Head: Normocephalic.  Neck:     Musculoskeletal: Normal range of motion and neck supple.  Cardiovascular:     Rate and Rhythm: Normal rate and regular rhythm.  Pulmonary:     Effort: Pulmonary effort is normal.     Breath sounds: Normal breath sounds. No wheezing, rhonchi or rales.  Abdominal:     General: Bowel sounds are normal.     Palpations: Abdomen is soft.     Tenderness: There is no abdominal tenderness. There is no guarding or rebound.  Musculoskeletal: Normal range of motion.     Right lower leg: No edema.     Left lower leg: No  edema.  Skin:    General: Skin is warm and dry.  Neurological:     Mental Status: He is alert and oriented to person, place, and time.     Comments: Constant, even motion of body, chronic.      ED Treatments / Results  Labs (all labs ordered are listed, but only abnormal results are displayed) Labs Reviewed  CBC - Abnormal; Notable for the following components:      Result Value   WBC 3.9 (*)    RBC 3.00 (*)    Hemoglobin 9.5 (*)    HCT 31.1 (*)    MCV 103.7 (*)    All other components within normal limits  BASIC METABOLIC PANEL  TROPONIN I (HIGH SENSITIVITY)    EKG EKG Interpretation  Date/Time:  Thursday September 12 2018 21:50:08 EDT Ventricular Rate:  75 PR Interval:  154 QRS Duration: 78 QT Interval:  426 QTC Calculation: 475 R Axis:   19 Text Interpretation:  Sinus rhythm with frequent ventricular-paced complexes and Premature supraventricular complexes Anterior infarct , age undetermined ST & T wave abnormality, consider inferolateral ischemia not persistently paced at this time.  some ventricular paced beats Confirmed by Blanchie Dessert [phone]) on 09/12/2018 10:07:45 PM   Radiology Dg Chest 2 View  Result Date: 09/12/2018 CLINICAL DATA:  Chest pain EXAM: CHEST - 2 VIEW COMPARISON:  09/03/2018 FINDINGS: Post sternotomy changes. Left-sided pacing device as before. Cardiomegaly with vascular congestion and mild perihilar ground-glass opacity. No pneumothorax. IMPRESSION: Low lung volumes. Cardiomegaly with vascular congestion and mild bilateral ground-glass opacity, possible edema Electronically Signed   By: Donavan Foil M.D.   On: 09/12/2018 22:24   Ir Paracentesis  Result Date: 09/11/2018 INDICATION: Recurrent ascites EXAM: ULTRASOUND-GUIDED PARACENTESIS COMPARISON:  Previous paracentesis. MEDICATIONS: 10 cc 1% lidocaine COMPLICATIONS: None immediate. TECHNIQUE: Informed written consent was obtained from the patient after a discussion of the risks, benefits and  alternatives to treatment. A timeout was performed prior to the initiation of the procedure. Initial ultrasound scanning demonstrates a large amount of ascites within the right lower abdominal quadrant. The right lower abdomen was prepped and draped in the usual sterile fashion. 1% lidocaine with epinephrine was used for local anesthesia. Under direct ultrasound guidance, a 19 gauge, 7-cm, Yueh catheter was introduced. An ultrasound image was saved for documentation purposed. The paracentesis was performed. The catheter was removed and a dressing was applied. The patient tolerated the procedure well without immediate post procedural complication. FINDINGS: A total of approximately 3 liters of yellow fluid was removed. IMPRESSION: Successful ultrasound-guided paracentesis yielding 3 liters of peritoneal fluid. Read by Lavonia Drafts [facility] Electronically Signed   By: Aletta Edouard M.D.   On: 09/11/2018 13:37    Procedures Procedures (including critical care time)  Medications Ordered in ED Medications - No data to display   Initial Impression / Assessment and Plan / ED Course  I have reviewed the triage vital signs and the nursing notes.  Pertinent labs & imaging results that were available during my care of the patient were reviewed by me and considered in my medical decision making (see chart for details).        Patient to ED with SoB, chest pain vs generalized pain, no fever, nausea (recurrent), cough (unchanged tonight).   CXR shows more edema when compared to previous. He is found to be dropping O2 saturations into the high 80's. Not on home O2. Pain medications provided with little relief.   Patient is put on 2L O2 and oxygen level increases to 98%. No change in other presenting symptoms.   COVID is negative. Hypoxia persists when off oxygen indicating a new oxygen requirement. Discussed with Dr. Alcario Drought, [facility], who accepts the patient for admission. Patient and wife updated.   Final  Clinical Impressions(s) /  ED Diagnoses   Final diagnoses:  None   1. Pulmonary edema 2. Hypoxia   ED Discharge Orders    None       Charlann Lange, PA-C 09/13/18 0436    Ripley Fraise, MD 09/13/18 [phone]

## 2018-09-12 NOTE — ED Notes (Signed)
  Patient SPO2 dropped to low 80s.  Eric [name] Notified and patient placed on 2L [place].  Patient 96% on O2

## 2018-09-12 NOTE — ED Triage Notes (Signed)
Pt arrives POV for eval of chest pain. Pt is Tues/Thurs/Sat Dialysis, completed treatment today. Pt has paracentesis done yesterday w/ 3L removed yesterday. Started w/ CP this evening, wife tried medications for GERD w/out relief. Wife reports pt is being treated for hep C at this time, AICD in place.

## 2018-09-13 ENCOUNTER — Other Ambulatory Visit (HOSPITAL_COMMUNITY): Payer: Medicare Other

## 2018-09-13 ENCOUNTER — Inpatient Hospital Stay (HOSPITAL_COMMUNITY): Payer: Medicare Other

## 2018-09-13 ENCOUNTER — Observation Stay (HOSPITAL_COMMUNITY): Payer: Medicare Other

## 2018-09-13 ENCOUNTER — Encounter (HOSPITAL_COMMUNITY): Payer: Self-pay | Admitting: Nephrology

## 2018-09-13 DIAGNOSIS — R7881 Bacteremia: Secondary | ICD-10-CM | POA: Diagnosis not present

## 2018-09-13 DIAGNOSIS — R0902 Hypoxemia: Secondary | ICD-10-CM

## 2018-09-13 DIAGNOSIS — Z87891 Personal history of nicotine dependence: Secondary | ICD-10-CM | POA: Diagnosis not present

## 2018-09-13 DIAGNOSIS — J810 Acute pulmonary edema: Secondary | ICD-10-CM | POA: Diagnosis present

## 2018-09-13 DIAGNOSIS — Z9049 Acquired absence of other specified parts of digestive tract: Secondary | ICD-10-CM | POA: Diagnosis not present

## 2018-09-13 DIAGNOSIS — N186 End stage renal disease: Secondary | ICD-10-CM

## 2018-09-13 DIAGNOSIS — I951 Orthostatic hypotension: Secondary | ICD-10-CM | POA: Diagnosis present

## 2018-09-13 DIAGNOSIS — I340 Nonrheumatic mitral (valve) insufficiency: Secondary | ICD-10-CM | POA: Diagnosis not present

## 2018-09-13 DIAGNOSIS — E871 Hypo-osmolality and hyponatremia: Secondary | ICD-10-CM | POA: Diagnosis not present

## 2018-09-13 DIAGNOSIS — B957 Other staphylococcus as the cause of diseases classified elsewhere: Secondary | ICD-10-CM | POA: Diagnosis not present

## 2018-09-13 DIAGNOSIS — R079 Chest pain, unspecified: Secondary | ICD-10-CM | POA: Diagnosis present

## 2018-09-13 DIAGNOSIS — I252 Old myocardial infarction: Secondary | ICD-10-CM | POA: Diagnosis not present

## 2018-09-13 DIAGNOSIS — R188 Other ascites: Secondary | ICD-10-CM | POA: Diagnosis not present

## 2018-09-13 DIAGNOSIS — K7460 Unspecified cirrhosis of liver: Secondary | ICD-10-CM | POA: Diagnosis present

## 2018-09-13 DIAGNOSIS — Z8249 Family history of ischemic heart disease and other diseases of the circulatory system: Secondary | ICD-10-CM | POA: Diagnosis not present

## 2018-09-13 DIAGNOSIS — I10 Essential (primary) hypertension: Secondary | ICD-10-CM

## 2018-09-13 DIAGNOSIS — Z20828 Contact with and (suspected) exposure to other viral communicable diseases: Secondary | ICD-10-CM | POA: Diagnosis not present

## 2018-09-13 DIAGNOSIS — E1122 Type 2 diabetes mellitus with diabetic chronic kidney disease: Secondary | ICD-10-CM

## 2018-09-13 DIAGNOSIS — I132 Hypertensive heart and chronic kidney disease with heart failure and with stage 5 chronic kidney disease, or end stage renal disease: Secondary | ICD-10-CM | POA: Diagnosis not present

## 2018-09-13 DIAGNOSIS — K219 Gastro-esophageal reflux disease without esophagitis: Secondary | ICD-10-CM | POA: Diagnosis present

## 2018-09-13 DIAGNOSIS — Z951 Presence of aortocoronary bypass graft: Secondary | ICD-10-CM | POA: Diagnosis not present

## 2018-09-13 DIAGNOSIS — I361 Nonrheumatic tricuspid (valve) insufficiency: Secondary | ICD-10-CM

## 2018-09-13 DIAGNOSIS — I5043 Acute on chronic combined systolic (congestive) and diastolic (congestive) heart failure: Secondary | ICD-10-CM | POA: Diagnosis not present

## 2018-09-13 DIAGNOSIS — Z992 Dependence on renal dialysis: Secondary | ICD-10-CM

## 2018-09-13 DIAGNOSIS — T82898A Other specified complication of vascular prosthetic devices, implants and grafts, initial encounter: Secondary | ICD-10-CM | POA: Diagnosis not present

## 2018-09-13 DIAGNOSIS — E785 Hyperlipidemia, unspecified: Secondary | ICD-10-CM | POA: Diagnosis present

## 2018-09-13 DIAGNOSIS — Y832 Surgical operation with anastomosis, bypass or graft as the cause of abnormal reaction of the patient, or of later complication, without mention of misadventure at the time of the procedure: Secondary | ICD-10-CM | POA: Diagnosis present

## 2018-09-13 DIAGNOSIS — J9601 Acute respiratory failure with hypoxia: Secondary | ICD-10-CM | POA: Diagnosis not present

## 2018-09-13 DIAGNOSIS — Z95810 Presence of automatic (implantable) cardiac defibrillator: Secondary | ICD-10-CM | POA: Diagnosis not present

## 2018-09-13 DIAGNOSIS — K7469 Other cirrhosis of liver: Secondary | ICD-10-CM

## 2018-09-13 DIAGNOSIS — Z8674 Personal history of sudden cardiac arrest: Secondary | ICD-10-CM | POA: Diagnosis not present

## 2018-09-13 DIAGNOSIS — N2581 Secondary hyperparathyroidism of renal origin: Secondary | ICD-10-CM | POA: Diagnosis not present

## 2018-09-13 LAB — RENAL FUNCTION PANEL
Albumin: 2.9 g/dL — ABNORMAL LOW (ref 3.5–5.0)
Albumin: 3.2 g/dL — ABNORMAL LOW (ref 3.5–5.0)
Anion gap: 10 (ref 5–15)
Anion gap: 10 (ref 5–15)
BUN: 19 mg/dL (ref 8–23)
BUN: 20 mg/dL (ref 8–23)
CO2: 33 mmol/L — ABNORMAL HIGH (ref 22–32)
CO2: 32 mmol/L (ref 22–32)
Calcium: 8.1 mg/dL — ABNORMAL LOW (ref 8.9–10.3)
Calcium: 8.2 mg/dL — ABNORMAL LOW (ref 8.9–10.3)
Chloride: 94 mmol/L — ABNORMAL LOW (ref 98–111)
Chloride: 95 mmol/L — ABNORMAL LOW (ref 98–111)
Creatinine, Ser: 5.48 mg/dL — ABNORMAL HIGH (ref 0.61–1.24)
Creatinine, Ser: 5.74 mg/dL — ABNORMAL HIGH (ref 0.61–1.24)
GFR calc Af Amer: 12 mL/min — ABNORMAL LOW (ref >60)
GFR calc Af Amer: 11 mL/min — ABNORMAL LOW (ref >60)
GFR calc non Af Amer: 10 mL/min — ABNORMAL LOW (ref >60)
GFR calc non Af Amer: 10 mL/min — ABNORMAL LOW (ref >60)
Glucose, Bld: 71 mg/dL (ref 70–99)
Glucose, Bld: 92 mg/dL (ref 70–99)
Phosphorus: 3.1 mg/dL (ref 2.5–4.6)
Phosphorus: 3.2 mg/dL (ref 2.5–4.6)
Potassium: 4.2 mmol/L (ref 3.5–5.1)
Potassium: 4.3 mmol/L (ref 3.5–5.1)
Sodium: 137 mmol/L (ref 135–145)
Sodium: 137 mmol/L (ref 135–145)

## 2018-09-13 LAB — CBC
HCT: 33.1 % — ABNORMAL LOW (ref 39.0–52.0)
HCT: 34.2 % — ABNORMAL LOW (ref 39.0–52.0)
Hemoglobin: 9.7 g/dL — ABNORMAL LOW (ref 13.0–17.0)
Hemoglobin: 10.2 g/dL — ABNORMAL LOW (ref 13.0–17.0)
MCH: 31.1 pg (ref 26.0–34.0)
MCH: 31.8 pg (ref 26.0–34.0)
MCHC: 29.3 g/dL — ABNORMAL LOW (ref 30.0–36.0)
MCHC: 29.8 g/dL — ABNORMAL LOW (ref 30.0–36.0)
MCV: 106.1 fL — ABNORMAL HIGH (ref 80.0–100.0)
MCV: 106.5 fL — ABNORMAL HIGH (ref 80.0–100.0)
Platelets: 69 10*3/uL — ABNORMAL LOW (ref 150–400)
Platelets: 63 10*3/uL — ABNORMAL LOW (ref 150–400)
RBC: 3.12 MIL/uL — ABNORMAL LOW (ref 4.22–5.81)
RBC: 3.21 MIL/uL — ABNORMAL LOW (ref 4.22–5.81)
RDW: 15.6 % — ABNORMAL HIGH (ref 11.5–15.5)
RDW: 15.5 % (ref 11.5–15.5)
WBC: 4.8 10*3/uL (ref 4.0–10.5)
WBC: 4.1 10*3/uL (ref 4.0–10.5)
nRBC: 0 % (ref 0.0–0.2)
nRBC: 0 % (ref 0.0–0.2)

## 2018-09-13 LAB — ECHOCARDIOGRAM COMPLETE
Height: 66 in
Weight: 2880 oz

## 2018-09-13 LAB — GLUCOSE, CAPILLARY
Glucose-Capillary: 51 mg/dL — ABNORMAL LOW (ref 70–99)
Glucose-Capillary: 80 mg/dL (ref 70–99)
Glucose-Capillary: 96 mg/dL (ref 70–99)
Glucose-Capillary: 195 mg/dL — ABNORMAL HIGH (ref 70–99)

## 2018-09-13 LAB — SARS CORONAVIRUS 2 (TAT 6-24 HRS): SARS Coronavirus 2: NEGATIVE

## 2018-09-13 LAB — TROPONIN I (HIGH SENSITIVITY)
Troponin I (High Sensitivity): 39 ng/L — ABNORMAL HIGH (ref <18)
Troponin I (High Sensitivity): 41 ng/L — ABNORMAL HIGH (ref <18)
Troponin I (High Sensitivity): 39 ng/L — ABNORMAL HIGH (ref <18)

## 2018-09-13 LAB — BRAIN NATRIURETIC PEPTIDE: B Natriuretic Peptide: 2881.3 pg/mL — ABNORMAL HIGH (ref 0.0–100.0)

## 2018-09-13 MED ORDER — CHLORHEXIDINE GLUCONATE CLOTH 2 % EX PADS
6.0000 | MEDICATED_PAD | Freq: Every day | CUTANEOUS | Status: DC
  Administered 2018-09-14: 06:00:00 6 via TOPICAL

## 2018-09-13 MED ORDER — LIDOCAINE HCL (PF) 1 % IJ SOLN: 5.0000 mL | INTRAMUSCULAR | Status: DC | PRN

## 2018-09-13 MED ORDER — HEPARIN SODIUM (PORCINE) 1000 UNIT/ML DIALYSIS: 1000.0000 [IU] | INTRAMUSCULAR | Status: DC | PRN

## 2018-09-13 MED ORDER — CALCIUM ACETATE (PHOS BINDER) 667 MG PO CAPS
667.0000 mg | ORAL_CAPSULE | Freq: Three times a day (TID) | ORAL | Status: DC
  Administered 2018-09-13 – 2018-09-23 (×25): 667 mg via ORAL
  Filled 2018-09-13 (×25): qty 1

## 2018-09-13 MED ORDER — HEPARIN SODIUM (PORCINE) 5000 UNIT/ML IJ SOLN: 5000.0000 [IU] | Freq: Three times a day (TID) | INTRAMUSCULAR | Status: DC

## 2018-09-13 MED ORDER — ROSUVASTATIN CALCIUM 20 MG PO TABS
20.0000 mg | ORAL_TABLET | Freq: Every day | ORAL | Status: DC
  Administered 2018-09-13 – 2018-09-22 (×10): 20 mg via ORAL
  Filled 2018-09-13 (×10): qty 1

## 2018-09-13 MED ORDER — ACETAMINOPHEN 325 MG PO TABS
650.0000 mg | ORAL_TABLET | Freq: Four times a day (QID) | ORAL | Status: DC | PRN
  Administered 2018-09-14 – 2018-09-22 (×5): 650 mg via ORAL
  Filled 2018-09-13 (×5): qty 2

## 2018-09-13 MED ORDER — ALTEPLASE 2 MG IJ SOLR: 2.0000 mg | Freq: Once | INTRAMUSCULAR | Status: DC | PRN

## 2018-09-13 MED ORDER — HYDRALAZINE HCL 10 MG PO TABS: 10.0000 mg | ORAL_TABLET | Freq: Four times a day (QID) | ORAL | Status: DC | PRN

## 2018-09-13 MED ORDER — FUROSEMIDE 20 MG PO TABS: 80.0000 mg | ORAL_TABLET | ORAL | Status: DC

## 2018-09-13 MED ORDER — HYDRALAZINE HCL 10 MG PO TABS: 10.0000 mg | ORAL_TABLET | Freq: Every day | ORAL | Status: DC | PRN

## 2018-09-13 MED ORDER — SODIUM CHLORIDE 0.9 % IV SOLN: 250.0000 mL | INTRAVENOUS | Status: DC | PRN

## 2018-09-13 MED ORDER — PENTAFLUOROPROP-TETRAFLUOROETH EX AERO: 1.0000 "application " | INHALATION_SPRAY | CUTANEOUS | Status: DC | PRN

## 2018-09-13 MED ORDER — RENA-VITE PO TABS
1.0000 | ORAL_TABLET | Freq: Every day | ORAL | Status: DC
  Administered 2018-09-13 – 2018-09-22 (×10): 1 via ORAL
  Filled 2018-09-13 (×10): qty 1

## 2018-09-13 MED ORDER — FENTANYL CITRATE (PF) 100 MCG/2ML IJ SOLN
INTRAMUSCULAR | Status: AC
  Filled 2018-09-13: qty 2

## 2018-09-13 MED ORDER — ASPIRIN EC 81 MG PO TBEC
81.0000 mg | DELAYED_RELEASE_TABLET | Freq: Every day | ORAL | Status: DC
  Administered 2018-09-13 – 2018-09-23 (×11): 81 mg via ORAL
  Filled 2018-09-13 (×11): qty 1

## 2018-09-13 MED ORDER — MORPHINE SULFATE (PF) 2 MG/ML IV SOLN
2.0000 mg | INTRAVENOUS | Status: DC | PRN
  Administered 2018-09-15 – 2018-09-22 (×5): 2 mg via INTRAVENOUS
  Filled 2018-09-13 (×6): qty 1

## 2018-09-13 MED ORDER — SODIUM CHLORIDE 0.9 % IV SOLN: 100.0000 mL | INTRAVENOUS | Status: DC | PRN

## 2018-09-13 MED ORDER — SODIUM CHLORIDE 0.9% FLUSH
3.0000 mL | Freq: Two times a day (BID) | INTRAVENOUS | Status: DC
  Administered 2018-09-13 – 2018-09-23 (×20): 3 mL via INTRAVENOUS

## 2018-09-13 MED ORDER — ONDANSETRON HCL 4 MG/2ML IJ SOLN
4.0000 mg | Freq: Four times a day (QID) | INTRAMUSCULAR | Status: DC | PRN
  Administered 2018-09-18: 12:00:00 4 mg via INTRAVENOUS
  Filled 2018-09-13: qty 2

## 2018-09-13 MED ORDER — PANTOPRAZOLE SODIUM 40 MG PO TBEC
40.0000 mg | DELAYED_RELEASE_TABLET | Freq: Every day | ORAL | Status: DC
  Administered 2018-09-13 – 2018-09-23 (×10): 40 mg via ORAL
  Filled 2018-09-13 (×10): qty 1

## 2018-09-13 MED ORDER — LIDOCAINE-PRILOCAINE 2.5-2.5 % EX CREA: 1.0000 "application " | TOPICAL_CREAM | CUTANEOUS | Status: DC | PRN

## 2018-09-13 MED ORDER — FENTANYL CITRATE (PF) 100 MCG/2ML IJ SOLN
50.0000 ug | Freq: Once | INTRAMUSCULAR | Status: AC
  Administered 2018-09-13: 50 ug via INTRAVENOUS

## 2018-09-13 MED ORDER — CINACALCET HCL 30 MG PO TABS
30.0000 mg | ORAL_TABLET | Freq: Every day | ORAL | Status: DC
  Administered 2018-09-13 – 2018-09-22 (×10): 30 mg via ORAL
  Filled 2018-09-13 (×11): qty 1

## 2018-09-13 MED ORDER — SODIUM CHLORIDE 0.9% FLUSH: 3.0000 mL | INTRAVENOUS | Status: DC | PRN

## 2018-09-13 MED ORDER — HEPARIN SODIUM (PORCINE) 1000 UNIT/ML DIALYSIS
1000.0000 [IU] | INTRAMUSCULAR | Status: DC | PRN
  Filled 2018-09-13: qty 1

## 2018-09-13 MED ORDER — METOPROLOL TARTRATE 25 MG PO TABS
25.0000 mg | ORAL_TABLET | Freq: Two times a day (BID) | ORAL | Status: DC
  Administered 2018-09-13 – 2018-09-23 (×16): 25 mg via ORAL
  Filled 2018-09-13 (×20): qty 1

## 2018-09-13 MED ORDER — PANTOPRAZOLE SODIUM 40 MG PO TBEC: 40.0000 mg | DELAYED_RELEASE_TABLET | ORAL | Status: DC | PRN

## 2018-09-13 MED ORDER — ISOSORBIDE MONONITRATE ER 30 MG PO TB24: 60.0000 mg | ORAL_TABLET | ORAL | Status: DC | PRN

## 2018-09-13 MED ORDER — LIDOCAINE-PRILOCAINE 2.5-2.5 % EX CREA
1.0000 "application " | TOPICAL_CREAM | CUTANEOUS | Status: DC | PRN
  Filled 2018-09-13: qty 5

## 2018-09-13 MED ORDER — CHLORHEXIDINE GLUCONATE CLOTH 2 % EX PADS
6.0000 | MEDICATED_PAD | Freq: Every day | CUTANEOUS | Status: DC
  Administered 2018-09-13: 6 via TOPICAL

## 2018-09-13 MED ORDER — MORPHINE SULFATE (PF) 4 MG/ML IV SOLN
4.0000 mg | Freq: Once | INTRAVENOUS | Status: AC
  Administered 2018-09-13: 4 mg via INTRAVENOUS
  Filled 2018-09-13: qty 1

## 2018-09-13 MED ORDER — GABAPENTIN 300 MG PO CAPS
300.0000 mg | ORAL_CAPSULE | Freq: Two times a day (BID) | ORAL | Status: DC
  Administered 2018-09-13 – 2018-09-14 (×3): 300 mg via ORAL
  Filled 2018-09-13 (×3): qty 1

## 2018-09-13 MED ORDER — NITROGLYCERIN 0.4 MG SL SUBL
0.4000 mg | SUBLINGUAL_TABLET | SUBLINGUAL | Status: DC | PRN
  Administered 2018-09-13 – 2018-09-15 (×2): 0.4 mg via SUBLINGUAL
  Filled 2018-09-13 (×2): qty 1

## 2018-09-13 MED ORDER — AMLODIPINE BESYLATE 5 MG PO TABS: 10.0000 mg | ORAL_TABLET | Freq: Every day | ORAL | Status: DC

## 2018-09-13 NOTE — H&P (Signed)
History and Physical    Eric [name] PYP:[id number] DOB: 01-26-1955 DOA: 09/12/2018  PCP: System, Pcp Not In  Patient coming from: Home  I have personally briefly reviewed patient's old medical records in [place]  Chief Complaint: CP, SOB  HPI: Eric [name] is a 63 y.o. male with medical history significant of ESRD on dialysis TTS, CHF, CAD, HTN, AICD following a prior cardiac arrest, HCV with cirrhosis.  Patient presents to the ED with c/o chest pressure, radiates to arms, legs, and back.  Onset around 9pm.  Got meds for tylenol, neurontin, GERD, as well as hydralazine and imdur (which he apparently takes PRN for BP) for SBP 150.  Pain hasnt really improved.  Does have associated SOB.  No fever.  Has frequent nausea but that's baseline, has chronic cough that's also baseline and unchanged.   ED Course: BNP 2881, CXR suggestive of mild pulmonary edema.  EKG shows TWI in inferior and lateral leads, which I dont see on any of the prior EKGs and seems to be new.  However, trops only 38 and 39.  BMP is most note able (other than the expected creatinine elevation) for a bicarb of 36, was 33 on 8/11, and was normal prior to that.   Review of Systems: As per HPI, otherwise all review of systems negative.  Past Medical History:  Diagnosis Date  . AICD (automatic cardioverter/defibrillator) present   . Cardiac arrest ([place]) 03/07/2013  . Cardiac arrest due to underlying cardiac condition ([place])   . CHF (congestive heart failure) ([place])   . CKD (chronic kidney disease), stage III ([place])   . Complication of anesthesia    got too much anesthesia and bp dropped very low Feb, 2019  . Edema   . ESRD on dialysis ([place])   . History of blood transfusion 1950's   "related to pinal menigitis; HAD 16 OPERATIONS TOTAL"  . Hyperlipemia   . Hypertension   . Ileus ([place]) 05/2015  . Myocardial infarction ([place]) 03/2013  . Pain in the chest   . Pneumonia 2016  . Rash and nonspecific skin  eruption 06/29/2014  . Scabies infestation 06/29/2014  . Shortness of breath dyspnea   . Type II diabetes mellitus ([place])    type 2  . Ventricular tachycardia [facility])     Past Surgical History:  Procedure Laterality Date  . AV FISTULA PLACEMENT Right 03/18/2015   Procedure: BRACHIOCEPHALIC ARTERIOVENOUS (AV) FISTULA CREATION;  Surgeon: Angelia Mould, MD;  Location: [place];  Service: Vascular;  Laterality: Right;  . BACK SURGERY  1950's   "for spinal menigitis; HAD 16 OPERATIONS TOTAL"  . CARDIAC CATHETERIZATION    . CHOLECYSTECTOMY N/A 06/17/2015   Procedure: LAPAROSCOPIC CHOLECYSTECTOMY;  Surgeon: Coralie Keens, MD;  Location: [place];  Service: General;  Laterality: N/A;  . CORONARY ARTERY BYPASS GRAFT  1999   cabg x4  . 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 HEAD SURGERY  1950'S   "for spinal menigitis; HAD 16 OPERATIONS TOTAL"  . IMPLANTABLE CARDIOVERTER DEFIBRILLATOR IMPLANT  03/24/2013   STJ single chamber ICD implanted by Dr Caryl Comes for secondary prevention  . IMPLANTABLE CARDIOVERTER DEFIBRILLATOR IMPLANT N/A 03/24/2013   Procedure: IMPLANTABLE CARDIOVERTER DEFIBRILLATOR IMPLANT;  Surgeon: Deboraha Sprang, MD;  Location: [facility] CATH LAB;  Service: Cardiovascular;  Laterality: N/A;  . INSERTION OF DIALYSIS CATHETER N/A 03/13/2015   Procedure: INSERTION OF TUNNELED DIALYSIS CATHETER;  Surgeon: Conrad P[place]ar [place], MD;  Location: [facility]  OR;  Service: Vascular;  Laterality: N/A;  . IR PARACENTESIS  08/20/2018  . IR PARACENTESIS  09/11/2018  . LEFT HEART CATHETERIZATION WITH CORONARY/GRAFT ANGIOGRAM  03/07/2013   Procedure: LEFT HEART CATHETERIZATION WITH [name];  Surgeon: Clent Demark, MD;  Location: [facility] CATH LAB;  Service: Cardiovascular;;  . REVISON OF ARTERIOVENOUS FISTULA Right 01/03/2018   Procedure: REVISION OF ARTERIOVENOUS FISTULA;  Surgeon: Waynetta Sandy, MD;  Location: [place];  Service: Vascular;  Laterality: Right;  .  UMBILICAL HERNIA REPAIR N/A 06/24/2015   Procedure: HERNIA REPAIR UMBILICAL ADULT;  Surgeon: Georganna Skeans, MD;  Location: [place];  Service: General;  Laterality: N/A;     reports that he has quit smoking. His smoking use included cigarettes. He has a 5.00 pack-year smoking history. He has never used smokeless tobacco. He reports that he does not drink alcohol or use drugs.  No Known Allergies  Family History  Problem Relation Age of Onset  . Heart disease Mother   . Diabetes Mother      Prior to Admission medications   Medication Sig Start Date End Date Taking? Authorizing Provider  acetaminophen (TYLENOL) 325 MG tablet Take 2 tablets (650 mg total) by mouth every 6 (six) hours as needed for mild pain or moderate pain. 07/01/15  Yes Smiley Houseman, MD  amLODipine (NORVASC) 10 MG tablet Take 10 mg by mouth daily. 05/23/18  Yes [provider]  aspirin EC 81 MG tablet Take 81 mg by mouth daily.    Yes [provider]  calcium acetate (PHOSLO) 667 MG capsule Take 667 mg by mouth 3 (three) times daily with meals.    Yes [provider]  cinacalcet (SENSIPAR) 30 MG tablet Take 30 mg by mouth daily with supper.   Yes [provider]  furosemide (LASIX) 80 MG tablet Take 1 tablet (80 mg total) by mouth every morning. Do not take on days of dialysis 07/17/18 10/15/18 Yes Adrian Prows, MD  gabapentin (NEURONTIN) 300 MG capsule Take 1 capsule (300 mg total) by mouth 2 (two) times daily. 04/01/18  Yes Miquel Dunn, NP  hydrALAZINE (APRESOLINE) 10 MG tablet Take 10 mg by mouth daily as needed (anxiety).   Yes [provider]  isosorbide mononitrate (IMDUR) 60 MG 24 hr tablet Take 60 mg by mouth as needed (Heart Rate).    Yes [provider]  metoprolol tartrate (LOPRESSOR) 25 MG tablet Take 1 tablet (25 mg total) by mouth 2 (two) times daily. 05/29/18 09/12/18 Yes Adrian Prows, MD  multivitamin (RENA-VIT) TABS tablet Take 1 tablet by mouth at  bedtime. 07/01/15  Yes Smiley Houseman, MD  pantoprazole (PROTONIX) 40 MG tablet Take 40 mg by mouth as needed (acid reflux).    Yes [provider]  rosuvastatin (CRESTOR) 20 MG tablet Take 1 tablet (20 mg total) by mouth daily at 6 PM. 01/05/18  Yes Kayleen Memos, [place]    Physical Exam: Vitals:   09/13/18 0245 09/13/18 0315 09/13/18 0345 09/13/18 0415  BP: 112/66 107/78 105/68 103/67  Pulse: 71 70 70 70  Resp: 20 18 12 13   Temp:      TempSrc:      SpO2: 98% 99% 97% 98%  Weight:      Height:        Constitutional: NAD, calm, comfortable Eyes: PERRL, lids and conjunctivae normal ENMT: Mucous membranes are moist. Posterior pharynx clear of any exudate or lesions.Normal dentition.  Neck: normal, supple, no masses, no  thyromegaly Respiratory: clear to auscultation bilaterally, no wheezing, no crackles. Normal respiratory effort. No accessory muscle use.  Cardiovascular: Regular rate and rhythm, no murmurs / rubs / gallops. No extremity edema. 2+ pedal pulses. No carotid bruits.  Abdomen: no tenderness, no masses palpated. No hepatosplenomegaly. Bowel sounds positive.  Musculoskeletal: no clubbing / cyanosis. No joint deformity upper and lower extremities. Good ROM, no contractures. Normal muscle tone.  Skin: no rashes, lesions, ulcers. No induration Neurologic: CN 2-12 grossly intact. Sensation intact, DTR normal. Strength 5/5 in all 4.  Psychiatric: Normal judgment and insight. Alert and oriented x 3. Normal mood.    Labs on Admission: I have personally reviewed following labs and imaging studies  CBC: Recent Labs  Lab 09/12/18 2154  WBC 3.9*  HGB 9.5*  HCT 31.1*  MCV 103.7*  PLT 68*   Basic Metabolic Panel: Recent Labs  Lab 09/12/18 2154  NA 140  K 3.8  CL 92*  CO2 36*  GLUCOSE 94  BUN 13  CREATININE 4.70*  CALCIUM 8.4*   GFR: Estimated Creatinine Clearance: 15.9 mL/min (A) (by C-G formula based on SCr of 4.7 mg/dL (H)). Liver Function Tests: No  results for input(s): AST, ALT, ALKPHOS, BILITOT, PROT, ALBUMIN in the last 168 hours. No results for input(s): LIPASE, AMYLASE in the last 168 hours. No results for input(s): AMMONIA in the last 168 hours. Coagulation Profile: No results for input(s): INR, PROTIME in the last 168 hours. Cardiac Enzymes: No results for input(s): CKTOTAL, CKMB, CKMBINDEX, TROPONINI in the last 168 hours. BNP (last 3 results) No results for input(s): PROBNP in the last 8760 hours. HbA1C: No results for input(s): HGBA1C in the last 72 hours. CBG: Recent Labs  Lab 09/11/18 1348  GLUCAP 99   Lipid Profile: No results for input(s): CHOL, HDL, LDLCALC, TRIG, CHOLHDL, LDLDIRECT in the last 72 hours. Thyroid Function Tests: No results for input(s): TSH, T4TOTAL, FREET4, T3FREE, THYROIDAB in the last 72 hours. Anemia Panel: No results for input(s): VITAMINB12, FOLATE, FERRITIN, TIBC, IRON, RETICCTPCT in the last 72 hours. Urine analysis:    Component Value Date/Time   COLORURINE YELLOW 03/12/2015 0020   APPEARANCEUR CLEAR 03/12/2015 0020   LABSPEC 1.015 03/12/2015 0020   PHURINE 5.5 03/12/2015 0020   GLUCOSEU NEGATIVE 03/12/2015 0020   HGBUR SMALL (A) 03/12/2015 0020   BILIRUBINUR NEGATIVE 03/12/2015 0020   KETONESUR NEGATIVE 03/12/2015 0020   PROTEINUR >300 (A) 03/12/2015 0020   UROBILINOGEN 1.0 08/03/2014 1855   NITRITE NEGATIVE 03/12/2015 0020   LEUKOCYTESUR NEGATIVE 03/12/2015 0020    Radiological Exams on Admission: Dg Chest 2 View  Result Date: 09/12/2018 CLINICAL DATA:  Chest pain EXAM: CHEST - 2 VIEW COMPARISON:  09/03/2018 FINDINGS: Post sternotomy changes. Left-sided pacing device as before. Cardiomegaly with vascular congestion and mild perihilar ground-glass opacity. No pneumothorax. IMPRESSION: Low lung volumes. Cardiomegaly with vascular congestion and mild bilateral ground-glass opacity, possible edema Electronically Signed   By: Donavan Foil M.D.   On: 09/12/2018 22:24   Ir  Paracentesis  Result Date: 09/11/2018 INDICATION: Recurrent ascites EXAM: ULTRASOUND-GUIDED PARACENTESIS COMPARISON:  Previous paracentesis. MEDICATIONS: 10 cc 1% lidocaine COMPLICATIONS: None immediate. TECHNIQUE: Informed written consent was obtained from the patient after a discussion of the risks, benefits and alternatives to treatment. A timeout was performed prior to the initiation of the procedure. Initial ultrasound scanning demonstrates a large amount of ascites within the right lower abdominal quadrant. The right lower abdomen was prepped and draped in the usual sterile fashion. 1% lidocaine  with epinephrine was used for local anesthesia. Under direct ultrasound guidance, a 19 gauge, 7-cm, Yueh catheter was introduced. An ultrasound image was saved for documentation purposed. The paracentesis was performed. The catheter was removed and a dressing was applied. The patient tolerated the procedure well without immediate post procedural complication. FINDINGS: A total of approximately 3 liters of yellow fluid was removed. IMPRESSION: Successful ultrasound-guided paracentesis yielding 3 liters of peritoneal fluid. Read by Lavonia Drafts [facility] Electronically Signed   By: Aletta Edouard M.D.   On: 09/11/2018 13:37    EKG: Independently reviewed.  Assessment/Plan Principal Problem:   Acute on chronic combined systolic and diastolic CHF (congestive heart failure) (HCC) Active Problems:   Hepatic cirrhosis (HCC)   DM (diabetes mellitus), type 2 with renal complications ([place])   Single chamber ICD-St.Jude 1411-36C Ellipse VR-CorVue-Merlin (DOI: 03/24/2013) in situ   Essential hypertension, benign   ESRD (end stage renal disease) ([place])   Acute on chronic combined systolic (congestive) and diastolic (congestive) heart failure ([place])    1. Acute on chronic combined CHF - 1. CHF pathway 2. 2d echo 3. ? New TWI in inferior lateral leads not on prior EKGs? (when not being paced) 4. Call Dr. Einar Gip in AM  5. Serial trops 6. Tele monitor 7. Holding home lasix for the moment (takes on non-dialysis days only), as he doesn't make too much urine anyhow (only every other day at most) 8. Instead, call nephrology in AM and see about getting him dialyzed for fluid overload. 2. ESRD - 1. Call nephro in AM for fluid overload dialysis 3. HTN - 1. At the moment he takes metoprolol BID scheduled, and then hydralazine and Imdur BOTH as PRNs only, took these last night.  Is no longer taking amlodipine 2. BP 509 systolic right now (but that's post hydralazine and imdur last night). 3. Continue metoprolol 4. Probably needs to be on daily Imdur I suspect but will leave this to Dr. Einar Gip 5. PRN hydralazine 4. Cirrhosis - 1. Secondary to HCV 2. f/u with GI outpatient, chronic, does have thrombocytopenia from this though. 5. DM2 - looks to be diet controlled only  DVT prophylaxis: SCDs Code Status: Full Family Communication: Wife at bedside Disposition Plan: Home after admit Consults called: None, call Dr. Einar Gip and nephrology in AM Admission status: Place in obs     Anmarie Fukushima, [place] [facility]  How to contact the [facility] Attending or Consulting provider [place] or covering provider during after hours [place], for this patient?  1. Check the care team in [facility] and look for a) attending/consulting [facility] provider listed and b) the [facility] team listed 2. Log into [email or web address]  Amion Physician Scheduling and messaging for groups and whole hospitals  On call and physician scheduling software for group practices, residents, hospitalists and other medical providers for call, clinic, rotation and shift schedules. On[employer] is a hospital-wide system for scheduling doctors and paging doctors on call. EasyPlot is for scientific plotting and data analysis.  [email or web address]  and use [place]'s universal password to access. If you do not have the password, please contact the hospital operator.  3. Locate  the [facility] provider you are looking for under [facility] and page to a number that you can be directly reached. 4. If you still have difficulty reaching the provider, please page the [facility] (Director on Call) for the Hospitalists listed on amion for assistance.  09/13/2018, 5:01 AM

## 2018-09-13 NOTE — Progress Notes (Signed)
  Echocardiogram 2D Echocardiogram has been performed.  Jennette Dubin 09/13/2018, 11:24 AM

## 2018-09-13 NOTE — ED Notes (Signed)
ED TO INPATIENT HANDOFF REPORT  ED Nurse Name and Phone #:   Lenice Pressman  [phone]  S Name/Age/Gender Eric [name] 63 y.o. male Room/Bed: 019C/019C  Code Status   Code Status: Full Code  Home/SNF/Other Home Patient oriented to: self, place, time and situation Is this baseline? Yes   Triage Complete: Triage complete  Chief Complaint chest pain, sob  Triage Note Pt arrives POV for eval of chest pain. Pt is Tues/Thurs/Sat Dialysis, completed treatment today. Pt has paracentesis done yesterday w/ 3L removed yesterday. Started w/ CP this evening, wife tried medications for GERD w/out relief. Wife reports pt is being treated for hep C at this time, AICD in place.    Allergies No Known Allergies  Level of Care/Admitting Diagnosis ED Disposition    ED Disposition Condition [facility] Area: [place] [[id number]]  Level of Care: Telemetry Cardiac [103]  I expect the patient will be discharged within 24 hours: No (not a candidate for 5C-Observation unit)  Covid Evaluation: Confirmed COVID Negative  Diagnosis: Acute on chronic combined systolic (congestive) and diastolic (congestive) heart failure ([place]) [932355]  Admitting Physician: Etta Quill [phone]  Attending Physician: Etta Quill [[id number]]  PT Class (Do Not Modify): Observation [104]  PT Acc Code (Do Not Modify): Observation [10022]       B Medical/Surgery History Past Medical History:  Diagnosis Date  . AICD (automatic cardioverter/defibrillator) present   . Cardiac arrest ([place]) 03/07/2013  . Cardiac arrest due to underlying cardiac condition ([place])   . CHF (congestive heart failure) ([place])   . CKD (chronic kidney disease), stage III ([place])   . Complication of anesthesia    got too much anesthesia and bp dropped very low Feb, 2019  . Edema   . ESRD on dialysis ([place])   . History of blood transfusion 1950's   "related to pinal menigitis; HAD 16 OPERATIONS TOTAL"  . Hyperlipemia    . Hypertension   . Ileus ([place]) 05/2015  . Myocardial infarction ([place]) 03/2013  . Pain in the chest   . Pneumonia 2016  . Rash and nonspecific skin eruption 06/29/2014  . Scabies infestation 06/29/2014  . Shortness of breath dyspnea   . Type II diabetes mellitus ([place])    type 2  . Ventricular tachycardia [facility])    Past Surgical History:  Procedure Laterality Date  . AV FISTULA PLACEMENT Right 03/18/2015   Procedure: BRACHIOCEPHALIC ARTERIOVENOUS (AV) FISTULA CREATION;  Surgeon: Angelia Mould, MD;  Location: [place];  Service: Vascular;  Laterality: Right;  . BACK SURGERY  1950's   "for spinal menigitis; HAD 16 OPERATIONS TOTAL"  . CARDIAC CATHETERIZATION    . CHOLECYSTECTOMY N/A 06/17/2015   Procedure: LAPAROSCOPIC CHOLECYSTECTOMY;  Surgeon: Coralie Keens, MD;  Location: [place];  Service: General;  Laterality: N/A;  . CORONARY ARTERY BYPASS GRAFT  1999   cabg x4  . 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 HEAD SURGERY  1950'S   "for spinal menigitis; HAD 16 OPERATIONS TOTAL"  . IMPLANTABLE CARDIOVERTER DEFIBRILLATOR IMPLANT  03/24/2013   STJ single chamber ICD implanted by Dr Caryl Comes for secondary prevention  . IMPLANTABLE CARDIOVERTER DEFIBRILLATOR IMPLANT N/A 03/24/2013   Procedure: IMPLANTABLE CARDIOVERTER DEFIBRILLATOR IMPLANT;  Surgeon: Deboraha Sprang, MD;  Location: [facility] CATH LAB;  Service: Cardiovascular;  Laterality: N/A;  . INSERTION OF DIALYSIS CATHETER N/A 03/13/2015   Procedure: INSERTION OF TUNNELED DIALYSIS CATHETER;  Surgeon: Aaron Edelman  Starlyn Skeans, MD;  Location: [place];  Service: Vascular;  Laterality: N/A;  . IR PARACENTESIS  08/20/2018  . IR PARACENTESIS  09/11/2018  . LEFT HEART CATHETERIZATION WITH CORONARY/GRAFT ANGIOGRAM  03/07/2013   Procedure: LEFT HEART CATHETERIZATION WITH [name];  Surgeon: Clent Demark, MD;  Location: [facility] CATH LAB;  Service: Cardiovascular;;  . REVISON OF ARTERIOVENOUS FISTULA Right 01/03/2018    Procedure: REVISION OF ARTERIOVENOUS FISTULA;  Surgeon: Waynetta Sandy, MD;  Location: [place];  Service: Vascular;  Laterality: Right;  . UMBILICAL HERNIA REPAIR N/A 06/24/2015   Procedure: HERNIA REPAIR UMBILICAL ADULT;  Surgeon: Georganna Skeans, MD;  Location: [place];  Service: General;  Laterality: N/A;     A IV Location/Drains/Wounds Patient Lines/Drains/Airways Status   Active Line/Drains/Airways    Name:   Placement date:   Placement time:   Site:   Days:   Peripheral IV 09/12/18 Left Antecubital   09/12/18    2201    Antecubital   1   Fistula / Graft Right Upper arm Arteriovenous fistula   03/18/15    0900    Upper arm   1275          Intake/Output Last 24 hours No intake or output data in the 24 hours ending 09/13/18 0548  Labs/Imaging Results for orders placed or performed during the hospital encounter of 09/12/18 (from the past 48 hour(s))  Basic metabolic panel     Status: Abnormal   Collection Time: 09/12/18  9:54 PM  Result Value Ref Range   Sodium 140 135 - 145 mmol/L   Potassium 3.8 3.5 - 5.1 mmol/L   Chloride 92 (L) 98 - 111 mmol/L   CO2 36 (H) 22 - 32 mmol/L   Glucose, Bld 94 70 - 99 mg/dL   BUN 13 8 - 23 mg/dL   Creatinine, Ser 4.70 (H) 0.61 - 1.24 mg/dL   Calcium 8.4 (L) 8.9 - 10.3 mg/dL   GFR calc non Af Amer 12 (L) >60 mL/min   GFR calc Af Amer 14 (L) >60 mL/min   Anion gap 12 5 - 15    Comment: Performed at [facility], [address] [place], [place] [address]  CBC     Status: Abnormal   Collection Time: 09/12/18  9:54 PM  Result Value Ref Range   WBC 3.9 (L) 4.0 - 10.5 K/uL   RBC 3.00 (L) 4.22 - 5.81 MIL/uL   Hemoglobin 9.5 (L) 13.0 - 17.0 g/dL   HCT 31.1 (L) 39.0 - 52.0 %   MCV 103.7 (H) 80.0 - 100.0 fL   MCH 31.7 26.0 - 34.0 pg   MCHC 30.5 30.0 - 36.0 g/dL   RDW 15.3 11.5 - 15.5 %   Platelets 68 (L) 150 - 400 K/uL    Comment: REPEATED TO VERIFY PLATELET COUNT CONFIRMED BY SMEAR SPECIMEN CHECKED FOR CLOTS Immature Platelet  Fraction may be clinically indicated, consider ordering this additional test [id number]    nRBC 0.0 0.0 - 0.2 %    Comment: Performed at [facility], [place] [address] [place], [place] [address]  Troponin I (High Sensitivity)     Status: Abnormal   Collection Time: 09/12/18  9:54 PM  Result Value Ref Range   Troponin I (High Sensitivity) 38 (H) <18 ng/L    Comment: (NOTE) Elevated high sensitivity troponin I (hsTnI) values and significant  changes across serial measurements may suggest ACS but many other  chronic and acute conditions are  known to elevate hsTnI results.  Refer to the "Links" section for chest pain algorithms and additional  guidance. Performed at [facility], [place] [address] [place], [place] [address]   SARS CORONAVIRUS 2     Status: None   Collection Time: 09/12/18 11:09 PM  Result Value Ref Range   SARS Coronavirus 2 NEGATIVE NEGATIVE    Comment: (NOTE) SARS-CoV-2 target nucleic acids are NOT DETECTED. The SARS-CoV-2 RNA is generally detectable in upper and lower respiratory specimens during the acute phase of infection. Negative results do not preclude SARS-CoV-2 infection, do not rule out co-infections with other pathogens, and should not be used as the sole basis for treatment or other patient management decisions. Negative results must be combined with clinical observations, patient history, and epidemiological information. The expected result is Negative. Fact Sheet for Patients: SugarRoll.be Fact Sheet for Healthcare Providers: https://www.woods-mathews.com/ This test is not yet approved or cleared by the [place] FDA and  has been authorized for detection and/or diagnosis of SARS-CoV-2 by FDA under an Emergency Use Authorization (EUA). This EUA will remain  in effect (meaning this test can be used) for the duration of the COVID-19 declaration under Section 56 4(b)(1) of the Act, 21  U.S.C. section 360bbb-3(b)(1), unless the authorization is terminated or revoked sooner. Performed at [facility], [place] [address] [place], [place] [address]   Troponin I (High Sensitivity)     Status: Abnormal   Collection Time: 09/13/18 12:52 AM  Result Value Ref Range   Troponin I (High Sensitivity) 39 (H) <18 ng/L    Comment: (NOTE) Elevated high sensitivity troponin I (hsTnI) values and significant  changes across serial measurements may suggest ACS but many other  chronic and acute conditions are known to elevate hsTnI results.  Refer to the "Links" section for chest pain algorithms and additional  guidance. Performed at [facility], [place] [address], [place], [place] [address]   Brain natriuretic peptide     Status: Abnormal   Collection Time: 09/13/18  3:10 AM  Result Value Ref Range   B Natriuretic Peptide 2,881.3 (H) 0.0 - 100.0 pg/mL    Comment: Performed at [place] [address], [place],  [address]   Dg Chest 2 View  Result Date: 09/12/2018 CLINICAL DATA:  Chest pain EXAM: CHEST - 2 VIEW COMPARISON:  09/03/2018 FINDINGS: Post sternotomy changes. Left-sided pacing device as before. Cardiomegaly with vascular congestion and mild perihilar ground-glass opacity. No pneumothorax. IMPRESSION: Low lung volumes. Cardiomegaly with vascular congestion and mild bilateral ground-glass opacity, possible edema Electronically Signed   By: Donavan Foil M.D.   On: 09/12/2018 22:24   Ir Paracentesis  Result Date: 09/11/2018 INDICATION: Recurrent ascites EXAM: ULTRASOUND-GUIDED PARACENTESIS COMPARISON:  Previous paracentesis. MEDICATIONS: 10 cc 1% lidocaine COMPLICATIONS: None immediate. TECHNIQUE: Informed written consent was obtained from the patient after a discussion of the risks, benefits and alternatives to treatment. A timeout was performed prior to the initiation of the procedure. Initial ultrasound scanning demonstrates a large amount of ascites  within the right lower abdominal quadrant. The right lower abdomen was prepped and draped in the usual sterile fashion. 1% lidocaine with epinephrine was used for local anesthesia. Under direct ultrasound guidance, a 19 gauge, 7-cm, Yueh catheter was introduced. An ultrasound image was saved for documentation purposed. The paracentesis was performed. The catheter was removed and a dressing was applied. The patient tolerated the procedure well without immediate post procedural complication. FINDINGS: A total  of approximately 3 liters of yellow fluid was removed. IMPRESSION: Successful ultrasound-guided paracentesis yielding 3 liters of peritoneal fluid. Read by Lavonia Drafts [facility] Electronically Signed   By: Aletta Edouard M.D.   On: 09/11/2018 13:37    Pending Labs Unresulted Labs (From admission, onward)    Start     Ordered   09/14/18 8546  Basic metabolic panel  Daily,   R     09/13/18 0434          Vitals/Pain Today's Vitals   09/13/18 0245 09/13/18 0315 09/13/18 0345 09/13/18 0415  BP: 112/66 107/78 105/68 103/67  Pulse: 71 70 70 70  Resp: 20 18 12 13   Temp:      TempSrc:      SpO2: 98% 99% 97% 98%  Weight:      Height:      PainSc:        Isolation Precautions No active isolations  Medications Medications  fentaNYL (SUBLIMAZE) 100 MCG/2ML injection (has no administration in time range)  acetaminophen (TYLENOL) tablet 650 mg (has no administration in time range)  aspirin EC tablet 81 mg (has no administration in time range)  calcium acetate (PHOSLO) capsule 667 mg (has no administration in time range)  cinacalcet (SENSIPAR) tablet 30 mg (has no administration in time range)  gabapentin (NEURONTIN) capsule 300 mg (has no administration in time range)  metoprolol tartrate (LOPRESSOR) tablet 25 mg (has no administration in time range)  multivitamin (RENA-VIT) tablet 1 tablet (has no administration in time range)  rosuvastatin (CRESTOR) tablet 20 mg (has no administration in  time range)  sodium chloride flush (NS) 0.9 % injection 3 mL (has no administration in time range)  sodium chloride flush (NS) 0.9 % injection 3 mL (has no administration in time range)  0.9 %  sodium chloride infusion (has no administration in time range)  ondansetron (ZOFRAN) injection 4 mg (has no administration in time range)  pantoprazole (PROTONIX) EC tablet 40 mg (has no administration in time range)  hydrALAZINE (APRESOLINE) tablet 10 mg (has no administration in time range)  ondansetron (ZOFRAN) injection 4 mg (4 mg Intravenous Given 09/12/18 2243)  fentaNYL (SUBLIMAZE) injection 50 mcg (50 mcg Intravenous Given 09/12/18 2243)  fentaNYL (SUBLIMAZE) injection 50 mcg (50 mcg Intravenous Given 09/13/18 0115)  morphine 4 MG/ML injection 4 mg (4 mg Intravenous Given 09/13/18 0247)    Mobility walks High fall risk   Focused Assessments Cardiac Assessment Handoff:  Cardiac Rhythm: Normal sinus rhythm Lab Results  Component Value Date   CKTOTAL 161 08/03/2018   CKMB 4.0 02/23/2014   TROPONINI 0.03 (HH) 01/02/2018   No results found for: DDIMER Does the Patient currently have chest pain? No     R Recommendations: See Admitting Provider Note  Report given to:   Additional Notes:

## 2018-09-13 NOTE — Consult Note (Signed)
Hospital Consult    Reason for Consult:  Ulceration right arm AVF Referring Physician:  Nephrology MRN #:  [id number]  History of Present Illness: This is a 63 y.o. male with history of end-stage renal disease on dialysis Tuesday Thursday Saturday, CHF, coronary artery disease, hypertension, AICD following previous cardiac arrest, HCV with cirrhosis that vascular surgery has been consulted for ulceration on right arm fistula.  Patient is a bit of a poor historian and cannot tell me when his fistula was placed.  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 Donzetta Matters.  Patient is in the hospital where he presented with chest pain.  Ultimately felt to be acute on chronic heart failure exacerbation.  He is getting dialysis today and they were able to stick away from the ulcer.  No immediate bleeding when evaluated at bedside.  Dialysis nurse did remove a dressing.  Past Medical History:  Diagnosis Date  . AICD (automatic cardioverter/defibrillator) present   . Cardiac arrest ([place]) 03/07/2013  . Cardiac arrest due to underlying cardiac condition ([place])   . CHF (congestive heart failure) ([place])   . CKD (chronic kidney disease), stage III ([place])   . Complication of anesthesia    got too much anesthesia and bp dropped very low Feb, 2019  . Edema   . ESRD on dialysis ([place])   . History of blood transfusion 1950's   "related to pinal menigitis; HAD 16 OPERATIONS TOTAL"  . Hyperlipemia   . Hypertension   . Ileus ([place]) 05/2015  . Myocardial infarction ([place]) 03/2013  . Pain in the chest   . Pneumonia 2016  . Rash and nonspecific skin eruption 06/29/2014  . Scabies infestation 06/29/2014  . Shortness of breath dyspnea   . Type II diabetes mellitus ([place])    type 2  . Ventricular tachycardia [facility])     Past Surgical History:  Procedure Laterality Date  . AV FISTULA PLACEMENT Right 03/18/2015   Procedure: BRACHIOCEPHALIC ARTERIOVENOUS (AV) FISTULA CREATION;  Surgeon: Angelia Mould, MD;  Location: [place];  Service: Vascular;  Laterality: Right;  . BACK SURGERY  1950's   "for spinal menigitis; HAD 16 OPERATIONS TOTAL"  . CARDIAC CATHETERIZATION    . CHOLECYSTECTOMY N/A 06/17/2015   Procedure: LAPAROSCOPIC CHOLECYSTECTOMY;  Surgeon: Coralie Keens, MD;  Location: [place];  Service: General;  Laterality: N/A;  . CORONARY ARTERY BYPASS GRAFT  1999   cabg x4  . 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 HEAD SURGERY  1950'S   "for spinal menigitis; HAD 16 OPERATIONS TOTAL"  . IMPLANTABLE CARDIOVERTER DEFIBRILLATOR IMPLANT  03/24/2013   STJ single chamber ICD implanted by Dr Caryl Comes for secondary prevention  . IMPLANTABLE CARDIOVERTER DEFIBRILLATOR IMPLANT N/A 03/24/2013   Procedure: IMPLANTABLE CARDIOVERTER DEFIBRILLATOR IMPLANT;  Surgeon: Deboraha Sprang, MD;  Location[facility] C[facility] LAB;  Service: Cardiovascular;  Laterality: N/A;  . INSERTION OF DIALYSIS CATHETER N/A 03/13/2015   Procedure: INSERTION OF TUNNELED DIALYSIS CATHETER;  Surgeon: Conrad Loco, MD;  Location: [place];  Service: Vascular;  Laterality: N/A;  . IR PARACENTESIS  08/20/2018  . IR PARACENTESIS  09/11/2018  . LEFT HEART CATHETERIZATION WITH CORONARY/GRAFT ANGIOGRAM  03/07/2013   Procedure: LEFT HEART CATHETERIZATION WITH [name];  Surgeon: Clent Demark, MD;  Location: [facility] LAB;  Service: Cardiovascular;;  . REVISON OF ARTERIOVENOUS FISTULA Right 01/03/2018  Procedure: REVISION OF ARTERIOVENOUS FISTULA;  Surgeon: Waynetta Sandy, MD;  Location: [place];  Service: Vascular;  Laterality: Right;  . UMBILICAL HERNIA REPAIR N/A 06/24/2015   Procedure: HERNIA REPAIR UMBILICAL ADULT;  Surgeon: Georganna Skeans, MD;  Location: [place];  Service: General;  Laterality: N/A;    No Known Allergies  Prior to Admission medications   Medication Sig Start Date End Date Taking?  Authorizing Provider  acetaminophen (TYLENOL) 325 MG tablet Take 2 tablets (650 mg total) by mouth every 6 (six) hours as needed for mild pain or moderate pain. 07/01/15  Yes Smiley Houseman, MD  amLODipine (NORVASC) 10 MG tablet Take 10 mg by mouth daily. 05/23/18  Yes [provider]  aspirin EC 81 MG tablet Take 81 mg by mouth daily.    Yes [provider]  calcium acetate (PHOSLO) 667 MG capsule Take 667 mg by mouth 3 (three) times daily with meals.    Yes [provider]  cinacalcet (SENSIPAR) 30 MG tablet Take 30 mg by mouth daily with supper.   Yes [provider]  furosemide (LASIX) 80 MG tablet Take 1 tablet (80 mg total) by mouth every morning. Do not take on days of dialysis 07/17/18 10/15/18 Yes Adrian Prows, MD  gabapentin (NEURONTIN) 300 MG capsule Take 1 capsule (300 mg total) by mouth 2 (two) times daily. 04/01/18  Yes Miquel [place], NP  hydrALAZINE (APRESOLINE) 10 MG tablet Take 10 mg by mouth daily as needed (anxiety).   Yes [provider]  isosorbide mononitrate (IMDUR) 60 MG 24 hr tablet Take 60 mg by mouth as needed (Heart Rate).    Yes [provider]  metoprolol tartrate (LOPRESSOR) 25 MG tablet Take 1 tablet (25 mg total) by mouth 2 (two) times daily. 05/29/18 09/12/18 Yes Adrian Prows, MD  multivitamin (RENA-VIT) TABS tablet Take 1 tablet by mouth at bedtime. 07/01/15  Yes Smiley Houseman, MD  pantoprazole (PROTONIX) 40 MG tablet Take 40 mg by mouth as needed (acid reflux).    Yes [provider]  rosuvastatin (CRESTOR) 20 MG tablet Take 1 tablet (20 mg total) by mouth daily at 6 PM. 01/05/18  Yes Kayleen Memos, DO    Social History   Socioeconomic History  . Marital status: Married    Spouse name: Not on file  . Number of children: 1  . Years of education: Not on file  . Highest education level: Not on file  Occupational History  . Occupation: N/A  Social Needs  . Financial resource strain: Not on  file  . Food insecurity    Worry: Not on file    Inability: Not on file  . Transportation needs    Medical: Not on file    Non-medical: Not on file  Tobacco Use  . Smoking status: Former Smoker    Packs/day: 1.00    Years: 5.00    Pack years: 5.00    Types: Cigarettes  . Smokeless tobacco: Never Used  Substance and Sexual Activity  . Alcohol use: No    Alcohol/week: 0.0 standard drinks  . Drug use: No  . Sexual activity: Not Currently  Lifestyle  . Physical activity    Days per week: Not on file    Minutes per session: Not on file  . Stress: Not on file  Relationships  . Social Herbalist on phone: Not on file    Gets together: Not on file    Attends religious  service: Not on file    Active member of club or organization: Not on file    Attends meetings of clubs or organizations: Not on file    Relationship status: Not on file  . Intimate partner violence    Fear of current or ex partner: Not on file    Emotionally abused: Not on file    Physically abused: Not on file    Forced sexual activity: Not on file  Other Topics Concern  . Not on file  Social History Narrative   Lives at home w/ his wife and daughter   Right-handed   Caffeine: none     Family History  Problem Relation Age of Onset  . Heart disease Mother   . Diabetes Mother     ROS: [x]  Positive   [ ]  Negative   [ ]  All sytems reviewed and are negative  Cardiovascular: []  chest pain/pressure []  palpitations []  SOB lying flat []  DOE []  pain in legs while walking []  pain in legs at rest []  pain in legs at night []  non-healing ulcers []  hx of DVT []  swelling in legs  Pulmonary: []  productive cough []  asthma/wheezing []  home O2  Neurologic: []  weakness in []  arms []  legs []  numbness in []  arms []  legs []  hx of CVA []  mini stroke [] difficulty speaking or slurred speech []  temporary loss of vision in one eye []  dizziness  Hematologic: []  hx of cancer []  bleeding problems []   problems with blood clotting easily  Endocrine:   []  diabetes []  thyroid disease  GI []  vomiting blood []  blood in stool  GU: []  CKD/renal failure []  HD--[]  M/W/F or []  T/T/S []  burning with urination []  blood in urine  Psychiatric: []  anxiety []  depression  Musculoskeletal: []  arthritis []  joint pain  Integumentary: []  rashes []  ulcers  Constitutional: []  fever []  chills   Physical Examination  Vitals:   09/13/18 0900 09/13/18 1235  BP:  108/67  Pulse: 71 79  Resp:  16  Temp:  98.6 F (37 C)  SpO2: 96% 98%   Body mass index is 28.86 kg/m.  General:  WDWN in NAD Gait: Not observed HENT: WNL, normocephalic Pulmonary: normal non-labored breathing, without Rales, rhonchi,  wheezing Cardiac: regular, without  Murmurs, rubs or gallops Abdomen: soft, NT/ND, no masses Vascular Exam/Pulses: Right arm brachiocephalic AVF with overlying ulcerated segment on upper arm as pictured below, good thrill No palpable radial pulse in right arm Musculoskeletal: no muscle wasting or atrophy  Neurologic: A&O X 3; Appropriate Affect ; SENSATION: normal; MOTOR FUNCTION:  moving all extremities equally. Speech is fluent/normal        CBC    Component Value Date/Time   WBC 4.1 09/13/2018 1156   RBC 3.21 (L) 09/13/2018 1156   HGB 10.2 (L) 09/13/2018 1156   HGB 9.9 (L) 07/24/2014 0855   HCT 34.2 (L) 09/13/2018 1156   HCT 28.7 (L) 07/24/2014 0855   PLT 63 (L) 09/13/2018 1156   PLT 206 07/24/2014 0855   MCV 106.5 (H) 09/13/2018 1156   MCV 90 07/24/2014 0855   MCH 31.8 09/13/2018 1156   MCHC 29.8 (L) 09/13/2018 1156   RDW 15.5 09/13/2018 1156   RDW 12.4 07/24/2014 0855   LYMPHSABS 0.5 (L) 08/20/2018 1151   LYMPHSABS 1.5 07/24/2014 0855   MONOABS 0.2 08/20/2018 1151   EOSABS 0.0 08/20/2018 1151   EOSABS 0.3 07/24/2014 0855   BASOSABS 0.0 08/20/2018 1151   BASOSABS 0.0 07/24/2014 0855  BMET    Component Value Date/Time   NA 137 09/13/2018 1156   K 4.3  09/13/2018 1156   CL 95 (L) 09/13/2018 1156   CO2 32 09/13/2018 1156   GLUCOSE 92 09/13/2018 1156   BUN 20 09/13/2018 1156   CREATININE 5.74 (H) 09/13/2018 1156   CALCIUM 8.2 (L) 09/13/2018 1156   GFRNONAA 10 (L) 09/13/2018 1156   GFRAA 11 (L) 09/13/2018 1156    COAGS: Lab Results  Component Value Date   INR 1.7 (H) 08/03/2018   INR 1.33 01/03/2018   INR 1.58 (H) 06/14/2015     Non-Invasive Vascular Imaging:    None  ASSESSMENT/PLAN: This is a  63 y.o. male with history of end-stage renal disease on dialysis Tuesday Thursday Saturday, CHF, coronary artery disease, hypertension, AICD following previous cardiac arrest, HCV with cirrhosis that vascular surgery has been consulted for ulceration on right arm fistula.   I evaluated the fistula at bedside in dialysis.  There is no evidence of active bleeding at this time.  The area appears hemostatic.  They are able to access away from the ulcerated segment in order for him to get dialysis.  He has already eaten today.  I will plan for aneurysm plication on Monday and he can be optimized through the weekend for the OR.  Given that this area has already been plicated before there is some risk that the fistula will eventually become unusable.  Certainly will make an attempt to salvage it.  Marty Heck, MD [facility] Office: [phone] Pager: [place]

## 2018-09-13 NOTE — Progress Notes (Signed)
   09/13/18 0900  Vitals  Pulse Rate 71  ECG Heart Rate 71  Oxygen Therapy  SpO2 96 %  O2 Device Nasal Cannula  O2 Flow Rate (L/min) 2 L/min  Pain Assessment  Pain Scale 0-10  Pain Score 8  Patients Stated Pain Goal 0  Pain Type Acute pain  Pain Location Chest  Pain Descriptors / Indicators Pressure  Pain Frequency Constant  Pain Onset On-going  Clinical Progression Not changed  MEWS Score  MEWS RR 0  MEWS Pulse 0  MEWS Systolic 0  MEWS LOC 0  MEWS Temp 0  MEWS Score 0  MEWS Score Color Green   RN gave Nitro SL for chest pain this morning no changes. Pain received two different IV pain medications in the ED with no change to CP. Will give Morphine IV ordered and notify MD if still no change with treatment plan.   Saddie Benders RN BSN

## 2018-09-13 NOTE — ED Notes (Signed)
Breakfast Tray ordered  

## 2018-09-13 NOTE — Progress Notes (Signed)
PROGRESS NOTE    Eric [name]  GLO:[id number] DOB: May 26, 1955 DOA: 09/12/2018 PCP: System, Pcp Not In   Brief Narrative:  Eric [name] is a 63 y.o. male with medical history significant of ESRD on dialysis TTS, CHF, CAD, HTN, AICD following a prior cardiac arrest, HCV with cirrhosis who presented to the ED with c/o chest pressure, radiates to arms, legs, and back.  Onset around 9pm on the night of 09/12/2018.  This was associated with some shortness of breath but no nausea, vomiting, radiation to the neck and shoulder or any diaphoresis.  Received Tylenol, neurontin as well as hydralazine and imdur (which he apparently takes PRN for BP) for SBP 150.  Pain hasnt really improved.  Does have associated SOB.  ED Course: BNP 2881, CXR suggestive of mild pulmonary edema.EKG shows TWI in inferior and lateral leads which are possibly new.  Troponins however have remained around 38/39 and are stable.  BMP showed creatinine of 4.7.  Electrolytes were stable.  Chest x-ray showed cardiomegaly with pulmonary edema and mild bilateral groundglass opacity.  COVID-19 negative.  Patient was admitted under hospitalist service for further management.  Nephrology was consulted for dialysis.  Assessment & Plan:   Principal Problem:   Acute on chronic combined systolic and diastolic CHF (congestive heart failure) (HCC) Active Problems:   Hepatic cirrhosis (HCC)   DM (diabetes mellitus), type 2 with renal complications (HCC)   Single chamber ICD-St.Jude 1411-36C Ellipse VR-CorVue-Merlin (DOI: 03/24/2013) in situ   Essential hypertension, benign   ESRD (end stage renal disease) ([place])   Acute on chronic combined systolic (congestive) and diastolic (congestive) heart failure (HCC)   Chest pain/shortness of breath: EKG?  For T wave inversion in lateral leads.  Troponin only slightly elevated around 39 but flat.  This is likely demand ischemia and not a sign of ACS.  Continue to monitor on telemetry.  This could  also be due to pulmonary edema.  He is going to have dialysis today.  I do not see any indication for cardiology consult at this point in time however if he continues to have chest pain despite of having dialysis then we may consider consulting them.  Acute hypoxic respiratory failure secondary to acute pulmonary edema/acute on chronic combined systolic congestive heart failure: Lasix on hold.  He takes that as needed on off dialysis.  He is going to have dialysis today.  Hopefully that will improve his symptoms.  Currently on 2 L of oxygen.  We will try to wean that off.  Essential hypertension: Blood pressure stable and on low normal side.  Continue metoprolol twice daily.  Apparently he takes Imdur and hydralazine as needed at home.  Currently blood pressure stable.  Continue PRN hydralazine.  No Imdur.  ESRD on HHS: Fall is TTS schedule.  Nephrology consulted.  Per discussion with Dr. Jonnie Finner, they are planning on doing dialysis today and tomorrow.  Patient will stay overnight.  History of liver cirrhosis/recurrent ascites: Patient has presented to ED twice in last 1 month with almost similar symptoms.  He ended up having diagnostic paracentesis on 08/20/2018 which was unremarkable for any infection.  He returned back to ER on 09/11/2018 and ended up having another therapeutic paracentesis with removal of 3 L.  Currently seems to have more ascites.  I am going to order ultrasound to make sure.  If significant amount then we will consult IR for diagnostic as well as therapeutic paracentesis.  Chronic thrombocytopenia: Secondary to liver cirrhosis.  Stable.  No signs of bleeding.  Avoid heparin products.  DVT prophylaxis: SCD/avoiding heparin products due to thrombocytopenia Code Status: Full code Family Communication: None present Disposition Plan: Potentially discharge tomorrow after dialysis  Consultants:   Nephrology  Procedures:   None  Antimicrobials:   None   Subjective: Patient  seen and examined.  He states that he does not feel any better than yesterday.  Still has abdominal pain.  He tells me that his chest pain is actually going to the abdominal pain.  Also has some shortness of breath.  Denied any nausea, vomiting, pain radiation to the shoulder or neck or jaw or any diaphoresis.  Objective: Vitals:   09/13/18 0809 09/13/18 0810 09/13/18 0823 09/13/18 0835  BP:   (!) 147/89 137/84  Pulse: 79 77 70 73  Resp:    14  Temp:    98.8 F (37.1 C)  TempSrc:    Oral  SpO2: (!) 86% (!) 86%  99%  Weight:      Height:        Intake/Output Summary (Last 24 hours) at 09/13/2018 0929 Last data filed at 09/13/2018 0700 Gross per 24 hour  Intake 471 ml  Output -  Net 471 ml   Filed Weights   09/12/18 2150  Weight: 81.6 kg    Examination:  General exam: Appears calm and comfortable  Respiratory system: Crackles bilaterally in the middle and lower lobes. Respiratory effort normal. Cardiovascular system: S1 & S2 heard, RRR. No JVD, murmurs, rubs, gallops or clicks. No pedal edema. Gastrointestinal system: Abdomen is distended but soft and nontender no organomegaly or masses felt. Normal bowel sounds heard.  Positive fluid shift Central nervous system: Alert and oriented. No focal neurological deficits. Extremities: Symmetric 5 x 5 power. Skin: No rashes, lesions or ulcers Psychiatry: Judgement and insight appear poor, mood & affect flat.   Data Reviewed: I have personally reviewed following labs and imaging studies  CBC: Recent Labs  Lab 09/12/18 2154  WBC 3.9*  HGB 9.5*  HCT 31.1*  MCV 103.7*  PLT 68*   Basic Metabolic Panel: Recent Labs  Lab 09/12/18 2154 09/13/18 0701  NA 140 137  K 3.8 4.2  CL 92* 94*  CO2 36* 33*  GLUCOSE 94 71  BUN 13 19  CREATININE 4.70* 5.48*  CALCIUM 8.4* 8.1*  PHOS  --  3.1   GFR: Estimated Creatinine Clearance: 13.7 mL/min (A) (by C-G formula based on SCr of 5.48 mg/dL (H)). Liver Function Tests: Recent Labs   Lab 09/13/18 0701  ALBUMIN 2.9*   No results for input(s): LIPASE, AMYLASE in the last 168 hours. No results for input(s): AMMONIA in the last 168 hours. Coagulation Profile: No results for input(s): INR, PROTIME in the last 168 hours. Cardiac Enzymes: No results for input(s): CKTOTAL, CKMB, CKMBINDEX, TROPONINI in the last 168 hours. BNP (last 3 results) No results for input(s): PROBNP in the last 8760 hours. HbA1C: No results for input(s): HGBA1C in the last 72 hours. CBG: Recent Labs  Lab 09/11/18 1348 09/13/18 0820  GLUCAP 99 51*   Lipid Profile: No results for input(s): CHOL, HDL, LDLCALC, TRIG, CHOLHDL, LDLDIRECT in the last 72 hours. Thyroid Function Tests: No results for input(s): TSH, T4TOTAL, FREET4, T3FREE, THYROIDAB in the last 72 hours. Anemia Panel: No results for input(s): VITAMINB12, FOLATE, FERRITIN, TIBC, IRON, RETICCTPCT in the last 72 hours. Sepsis Labs: No results for input(s): PROCALCITON, LATICACIDVEN in the last 168 hours.  Recent Results (from the past 240 hour(s))  SARS CORONAVIRUS 2     Status: None   Collection Time: 09/12/18 11:09 PM  Result Value Ref Range Status   SARS Coronavirus 2 NEGATIVE NEGATIVE Final    Comment: (NOTE) SARS-CoV-2 target nucleic acids are NOT DETECTED. The SARS-CoV-2 RNA is generally detectable in upper and lower respiratory specimens during the acute phase of infection. Negative results do not preclude SARS-CoV-2 infection, do not rule out co-infections with other pathogens, and should not be used as the sole basis for treatment or other patient management decisions. Negative results must be combined with clinical observations, patient history, and epidemiological information. The expected result is Negative. Fact Sheet for Patients: SugarRoll.be Fact Sheet for Healthcare Providers: https://www.woods-mathews.com/ This test is not yet approved or cleared by the [place]  FDA and  has been authorized for detection and/or diagnosis of SARS-CoV-2 by FDA under an Emergency Use Authorization (EUA). This EUA will remain  in effect (meaning this test can be used) for the duration of the COVID-19 declaration under Section 56 4(b)(1) of the Act, 21 U.S.C. section 360bbb-3(b)(1), unless the authorization is terminated or revoked sooner. Performed at [facility], [place] [address] [place], [place] [address]       Radiology Studies: Dg Chest 2 View  Result Date: 09/12/2018 CLINICAL DATA:  Chest pain EXAM: CHEST - 2 VIEW COMPARISON:  09/03/2018 FINDINGS: Post sternotomy changes. Left-sided pacing device as before. Cardiomegaly with vascular congestion and mild perihilar ground-glass opacity. No pneumothorax. IMPRESSION: Low lung volumes. Cardiomegaly with vascular congestion and mild bilateral ground-glass opacity, possible edema Electronically Signed   By: Donavan Foil M.D.   On: 09/12/2018 22:24   Ir Paracentesis  Result Date: 09/11/2018 INDICATION: Recurrent ascites EXAM: ULTRASOUND-GUIDED PARACENTESIS COMPARISON:  Previous paracentesis. MEDICATIONS: 10 cc 1% lidocaine COMPLICATIONS: None immediate. TECHNIQUE: Informed written consent was obtained from the patient after a discussion of the risks, benefits and alternatives to treatment. A timeout was performed prior to the initiation of the procedure. Initial ultrasound scanning demonstrates a large amount of ascites within the right lower abdominal quadrant. The right lower abdomen was prepped and draped in the usual sterile fashion. 1% lidocaine with epinephrine was used for local anesthesia. Under direct ultrasound guidance, a 19 gauge, 7-cm, Yueh catheter was introduced. An ultrasound image was saved for documentation purposed. The paracentesis was performed. The catheter was removed and a dressing was applied. The patient tolerated the procedure well without immediate post procedural complication. FINDINGS: A  total of approximately 3 liters of yellow fluid was removed. IMPRESSION: Successful ultrasound-guided paracentesis yielding 3 liters of peritoneal fluid. Read by Lavonia Drafts [facility] Electronically Signed   By: Aletta Edouard M.D.   On: 09/11/2018 13:37    Scheduled Meds: . aspirin EC  81 mg Oral Daily  . calcium acetate  667 mg Oral TID WC  . Chlorhexidine Gluconate Cloth  6 each Topical Q0600  . cinacalcet  30 mg Oral Q supper  . fentaNYL      . gabapentin  300 mg Oral BID  . metoprolol tartrate  25 mg Oral BID  . multivitamin  1 tablet Oral QHS  . pantoprazole  40 mg Oral Daily  . rosuvastatin  20 mg Oral q1800  . sodium chloride flush  3 mL Intravenous Q12H   Continuous Infusions: . sodium chloride    . sodium chloride    . sodium chloride       LOS: 0 days   Time spent: 35 minutes  Darliss Cheney, MD [facility] Pager [phone]  If 7PM-7AM, please contact night-coverage [email or web address] Password [facility] 09/13/2018, 9:29 AM

## 2018-09-13 NOTE — Consult Note (Addendum)
[facility] Renal Consultation Note    Indication for Consultation:  Management of ESRD/hemodialysis; anemia, hypertension/volume and secondary hyperparathyroidism  NFA:OZHYQM, Pcp Not In  HPI: Eric [name] is a 63 y.o. male with ESRD on HD TTS at [facility]. 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 Of note patient is compliant with prescribed dialysis regimen.  His last HD was yesterday and he left 0.5kg under his EDW.    Patient presented to ED d/t chest pain/pressure and SOB starting last night.  Reports cough with yellow phlem and LE edema.  He had a paracentesis by IR on Wednesday that yielded 3L yellow fluid.  Today his CP/SOB is improved. Admits to nausea.  Denies v/d, fever, chills, dizziness and weakness. Pertinent findings include BNP 2,881, CXR showing vascular congestion and possible pulmonary edema and ECHO showing L ventricle normal systolic fxn w/EF 57-84%, R ventricle volume overload w/severly reduced systolic fxn, severe tricuspid regurgitation.  Patient has been admitted for further evaluation and management.     Of note, when the dialysis nurse removed the dressing surrounding his dialysis access she reported 2 areas of ulceration with possible pus on the proximal ulceration and blood saturating the first layer gauze.  Review of OP record shows ulceration was noted at dialysis earlier this month and patient was referred to vascular for evaluation. He had a prior revision in 12/2017 by Dr. Donzetta Matters.    Past Medical History:  Diagnosis Date  . AICD (automatic cardioverter/defibrillator) present   . Cardiac arrest ([place]) 03/07/2013  . Cardiac arrest due to underlying cardiac condition ([place])   . CHF (congestive heart failure) ([place])   . CKD (chronic kidney disease), stage III ([place])   . Complication of anesthesia    got too much anesthesia and bp dropped very low Feb, 2019   . Edema   . ESRD on dialysis ([place])   . History of blood transfusion 1950's   "related to pinal menigitis; HAD 16 OPERATIONS TOTAL"  . Hyperlipemia   . Hypertension   . Ileus ([place]) 05/2015  . Myocardial infarction ([place]) 03/2013  . Pain in the chest   . Pneumonia 2016  . Rash and nonspecific skin eruption 06/29/2014  . Scabies infestation 06/29/2014  . Shortness of breath dyspnea   . Type II diabetes mellitus ([place])    type 2  . Ventricular tachycardia [facility])    Past Surgical History:  Procedure Laterality Date  . AV FISTULA PLACEMENT Right 03/18/2015   Procedure: BRACHIOCEPHALIC ARTERIOVENOUS (AV) FISTULA CREATION;  Surgeon: Angelia Mould, MD;  Location: [facility];  Service: Vascular;  Laterality: Right;  . BACK SURGERY  1950's   "for spinal menigitis; HAD 16 OPERATIONS TOTAL"  . CARDIAC CATHETERIZATION    . CHOLECYSTECTOMY N/A 06/17/2015   Procedure: LAPAROSCOPIC CHOLECYSTECTOMY;  Surgeon: Coralie Keens, MD;  Location: [place];  Service: General;  Laterality: N/A;  . CORONARY ARTERY BYPASS GRAFT  1999   cabg x4  . 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 HEAD SURGERY  1950'S   "for spinal menigitis; HAD 16 OPERATIONS TOTAL"  . IMPLANTABLE CARDIOVERTER DEFIBRILLATOR IMPLANT  03/24/2013   STJ single chamber ICD implanted by Dr Caryl Comes for secondary prevention  . IMPLANTABLE CARDIOVERTER DEFIBRILLATOR IMPLANT N/A 03/24/2013   Procedure: IMPLANTABLE CARDIOVERTER DEFIBRILLATOR IMPLANT;  Surgeon: Deboraha Sprang, MD;  Location: [facility] CATH LAB;  Service: Cardiovascular;  Laterality: N/A;  . INSERTION OF DIALYSIS CATHETER N/A 03/13/2015   Procedure: INSERTION OF TUNNELED DIALYSIS CATHETER;  Surgeon: Conrad Dodge Center, MD;  Location: [place];  Service: Vascular;  Laterality: N/A;  . IR PARACENTESIS  08/20/2018  . IR PARACENTESIS  09/11/2018  . LEFT HEART CATHETERIZATION WITH CORONARY/GRAFT ANGIOGRAM  03/07/2013   Procedure: LEFT HEART CATHETERIZATION WITH [name];  Surgeon: Clent Demark, MD;  Location: [facility] CATH LAB;  Service: Cardiovascular;;  . REVISON OF ARTERIOVENOUS FISTULA Right 01/03/2018   Procedure: REVISION OF ARTERIOVENOUS FISTULA;  Surgeon: Waynetta Sandy, MD;  Location: [place];  Service: Vascular;  Laterality: Right;  . UMBILICAL HERNIA REPAIR N/A 06/24/2015   Procedure: HERNIA REPAIR UMBILICAL ADULT;  Surgeon: Georganna Skeans, MD;  Location: [facility] OR;  Service: General;  Laterality: N/A;   Family History  Problem Relation Age of Onset  . Heart disease Mother   . Diabetes Mother    Social History:  reports that he has quit smoking. His smoking use included cigarettes. He has a 5.00 pack-year smoking history. He has never used smokeless tobacco. He reports that he does not drink alcohol or use drugs. No Known Allergies Prior to Admission medications   Medication Sig Start Date End Date Taking? Authorizing Provider  acetaminophen (TYLENOL) 325 MG tablet Take 2 tablets (650 mg total) by mouth every 6 (six) hours as needed for mild pain or moderate pain. 07/01/15  Yes Smiley Houseman, MD  amLODipine (NORVASC) 10 MG tablet Take 10 mg by mouth daily. 05/23/18  Yes [provider]  aspirin EC 81 MG tablet Take 81 mg by mouth daily.    Yes [provider]  calcium acetate (PHOSLO) 667 MG capsule Take 667 mg by mouth 3 (three) times daily with meals.    Yes [provider]  cinacalcet (SENSIPAR) 30 MG tablet Take 30 mg by mouth daily with supper.   Yes [provider]  furosemide (LASIX) 80 MG tablet Take 1 tablet (80 mg total) by mouth every morning. Do not take on days of dialysis 07/17/18 10/15/18 Yes Adrian Prows, MD  gabapentin (NEURONTIN) 300 MG capsule Take 1 capsule (300 mg total) by mouth 2 (two) times daily. 04/01/18  Yes Miquel Dunn, NP  hydrALAZINE (APRESOLINE) 10 MG tablet Take 10 mg by mouth daily as needed (anxiety).   Yes [provider]  isosorbide mononitrate  (IMDUR) 60 MG 24 hr tablet Take 60 mg by mouth as needed (Heart Rate).    Yes [provider]  metoprolol tartrate (LOPRESSOR) 25 MG tablet Take 1 tablet (25 mg total) by mouth 2 (two) times daily. 05/29/18 09/12/18 Yes Adrian Prows, MD  multivitamin (RENA-VIT) TABS tablet Take 1 tablet by mouth at bedtime. 07/01/15  Yes Smiley Houseman, MD  pantoprazole (PROTONIX) 40 MG tablet Take 40 mg by mouth as needed (acid reflux).    Yes [provider]  rosuvastatin (CRESTOR) 20 MG tablet Take 1 tablet (20 mg total) by mouth daily at 6 PM. 01/05/18  Yes Kayleen Memos, DO   Current Facility-Administered Medications  Medication Dose Route Frequency Provider Last Rate Last Dose  . 0.9 %  sodium chloride infusion  250 mL Intravenous PRN Etta Quill, DO      . acetaminophen (TYLENOL) tablet 650 mg  650 mg Oral Q6H PRN Etta Quill, DO      . aspirin EC tablet 81 mg  81 mg Oral Daily Jennette Kettle  M, DO   81 mg at 09/13/18 8099  . calcium acetate (PHOSLO) capsule 667 mg  667 mg Oral TID WC Etta Quill, DO   667 mg at 09/13/18 0825  . Chlorhexidine Gluconate Cloth 2 % PADS 6 each  6 each Topical Q0600 [place], [place], [place]   6 each at 09/13/18 1649  . cinacalcet (SENSIPAR) tablet 30 mg  30 mg Oral Q supper Etta Quill, DO      . gabapentin (NEURONTIN) capsule 300 mg  300 mg Oral BID Etta Quill, DO   300 mg at 09/13/18 0825  . hydrALAZINE (APRESOLINE) tablet 10 mg  10 mg Oral Q6H PRN Etta Quill, DO      . metoprolol tartrate (LOPRESSOR) tablet 25 mg  25 mg Oral BID Jennette Kettle M, DO   25 mg at 09/13/18 8338  . morphine 2 MG/ML injection 2 mg  2 mg Intravenous Q4H PRN Darliss Cheney, MD      . multivitamin (RENA-VIT) tablet 1 tablet  1 tablet Oral QHS Jennette Kettle M, DO      . nitroGLYCERIN (NITROSTAT) SL tablet 0.4 mg  0.4 mg Sublingual Q5 min PRN Darliss Cheney, MD   0.4 mg at 09/13/18 2505  . ondansetron (ZOFRAN) injection 4 mg  4 mg Intravenous Q6H PRN  Etta Quill, DO      . pantoprazole (PROTONIX) EC tablet 40 mg  40 mg Oral Daily Jennette Kettle M, DO   40 mg at 09/13/18 0825  . rosuvastatin (CRESTOR) tablet 20 mg  20 mg Oral q1800 Etta Quill, DO      . sodium chloride flush (NS) 0.9 % injection 3 mL  3 mL Intravenous Q12H Jennette Kettle M, DO   3 mL at 09/13/18 1650  . sodium chloride flush (NS) 0.9 % injection 3 mL  3 mL Intravenous PRN Etta Quill, DO       Labs: Basic Metabolic Panel: Recent Labs  Lab 09/12/18 2154 09/13/18 0701 09/13/18 1156  NA 140 137 137  K 3.8 4.2 4.3  CL 92* 94* 95*  CO2 36* 33* 32  GLUCOSE 94 71 92  BUN 13 19 20   CREATININE 4.70* 5.48* 5.74*  CALCIUM 8.4* 8.1* 8.2*  PHOS  --  3.1 3.2   Liver Function Tests: Recent Labs  Lab 09/13/18 0701 09/13/18 1156  ALBUMIN 2.9* 3.2*   CBC: Recent Labs  Lab 09/12/18 2154 09/13/18 0901 09/13/18 1156  WBC 3.9* 4.8 4.1  HGB 9.5* 9.7* 10.2*  HCT 31.1* 33.1* 34.2*  MCV 103.7* 106.1* 106.5*  PLT 68* 69* 63*   CBG: Recent Labs  Lab 09/11/18 1348 09/13/18 0820 09/13/18 1117 09/13/18 1650  GLUCAP 99 51* 80 96   Studies/Results: Dg Chest 2 View  Result Date: 09/12/2018 CLINICAL DATA:  Chest pain EXAM: CHEST - 2 VIEW COMPARISON:  09/03/2018 FINDINGS: Post sternotomy changes. Left-sided pacing device as before. Cardiomegaly with vascular congestion and mild perihilar ground-glass opacity. No pneumothorax. IMPRESSION: Low lung volumes. Cardiomegaly with vascular congestion and mild bilateral ground-glass opacity, possible edema Electronically Signed   By: Donavan Foil M.D.   On: 09/12/2018 22:24    ROS: All others negative except those listed in HPI.  Physical Exam: Vitals:   09/13/18 1430 09/13/18 1500 09/13/18 1530 09/13/18 1545  BP: 110/60 (!) 105/49 (!) 105/51 (!) 110/56  Pulse: 69 73 73 73  Resp:    18  Temp:    98.6 F (37 C)  TempSrc:    Oral  SpO2:      Weight:    79.4 kg  Height:         General: WDWN, NAD,  male Head: NCAT sclera not icteric MMM Neck: Supple. No lymphadenopathy Lungs: breath sounds decreased as bases. No wheeze, rales or rhonchi. Breathing is unlabored. Heart: RRR. No murmur, rubs or gallops.  Abdomen: soft, nontender, mildly distended, +BS, no guarding, no rebound tenderness Lower extremities:trace edema, +rubor on shins, no open wounds  Neuro: AAOx3. Moves all extremities spontaneously. Psych:  Responds to questions appropriately with a normal affect. Dialysis Access:RU AVF w/ulceration on proximal portion, +bruit  Dialysis Orders:  TTS - [place] 3.5hrs, BFR 400, DFR AF1.5,  EDW 81kg, 2K/ 2Ca  Access: RU AVF  Heparin None Mircera 225 mcg q2wks - last 8/18 Hectorol 69mcg IV qHD Venofer 50mg  qwk      Assessment/Plan: 1.  CP/SOB 2/2 AoC CHF/Volume overload - CXR shows possible pulmonary edema. Plan for extra HD today for volume removal, then again tomorrow per regular schedule.  2. RU AVF - ulceration overlaying fistula.  VVS consulted, aneurysm plication planned for Monday.    3.  ESRD - on HD TTS, Plan for extra HD today then resume regular schedule tomorrow. K 4.3. 4.  Hypertension  - BP well controlled.  Continue home meds. 5.  Anemia of CKD - Hgb 10.2, ESA just dosed.  Continue VDRA and binders. 6.  Secondary Hyperparathyroidism -  Ca and phos at goal.  7.  Nutrition - Renal diet w/fluid restrictions 8. Cirrhosis 9. DMT2  Jen Mow, PA-C [facility] Pager: [phone] 09/13/2018, 5:05 PM   Pt seen, examined and agree w A/P as above.  Kelly Splinter  MD 09/14/2018, 7:58 AM

## 2018-09-14 ENCOUNTER — Inpatient Hospital Stay (HOSPITAL_COMMUNITY): Payer: Medicare Other

## 2018-09-14 LAB — BLOOD CULTURE ID PANEL (REFLEXED)

## 2018-09-14 LAB — BASIC METABOLIC PANEL
Anion gap: 9 (ref 5–15)
BUN: 16 mg/dL (ref 8–23)
CO2: 31 mmol/L (ref 22–32)
Calcium: 8 mg/dL — ABNORMAL LOW (ref 8.9–10.3)
Chloride: 95 mmol/L — ABNORMAL LOW (ref 98–111)
Creatinine, Ser: 4.76 mg/dL — ABNORMAL HIGH (ref 0.61–1.24)
GFR calc Af Amer: 14 mL/min — ABNORMAL LOW (ref >60)
GFR calc non Af Amer: 12 mL/min — ABNORMAL LOW (ref >60)
Glucose, Bld: 77 mg/dL (ref 70–99)
Potassium: 4.6 mmol/L (ref 3.5–5.1)
Sodium: 135 mmol/L (ref 135–145)

## 2018-09-14 LAB — GRAM STAIN

## 2018-09-14 LAB — GLUCOSE, CAPILLARY
Glucose-Capillary: 85 mg/dL (ref 70–99)
Glucose-Capillary: 108 mg/dL — ABNORMAL HIGH (ref 70–99)
Glucose-Capillary: 100 mg/dL — ABNORMAL HIGH (ref 70–99)
Glucose-Capillary: 121 mg/dL — ABNORMAL HIGH (ref 70–99)

## 2018-09-14 LAB — BODY FLUID CELL COUNT WITH DIFFERENTIAL
Lymphs, Fluid: 13 %
Monocyte-Macrophage-Serous Fluid: 83 % (ref 50–90)
Neutrophil Count, Fluid: 4 % (ref 0–25)
Total Nucleated Cell Count, Fluid: 139 cu mm (ref 0–1000)

## 2018-09-14 LAB — PROTEIN, PLEURAL OR PERITONEAL FLUID: Total protein, fluid: 4.7 g/dL

## 2018-09-14 LAB — ALBUMIN, PLEURAL OR PERITONEAL FLUID: Albumin, Fluid: 2 g/dL

## 2018-09-14 LAB — GLUCOSE, PLEURAL OR PERITONEAL FLUID: Glucose, Fluid: 100 mg/dL

## 2018-09-14 MED ORDER — GABAPENTIN 300 MG PO CAPS
300.0000 mg | ORAL_CAPSULE | Freq: Every day | ORAL | Status: DC
  Administered 2018-09-15 – 2018-09-22 (×8): 300 mg via ORAL
  Filled 2018-09-14 (×8): qty 1

## 2018-09-14 MED ORDER — PRO-STAT SUGAR FREE PO LIQD
30.0000 mL | Freq: Two times a day (BID) | ORAL | Status: DC
  Administered 2018-09-14 – 2018-09-23 (×16): 30 mL via ORAL
  Filled 2018-09-14 (×16): qty 30

## 2018-09-14 MED ORDER — DOXERCALCIFEROL 4 MCG/2ML IV SOLN
2.0000 ug | INTRAVENOUS | Status: DC
  Administered 2018-09-17 – 2018-09-21 (×3): 2 ug via INTRAVENOUS
  Filled 2018-09-14 (×4): qty 2

## 2018-09-14 MED ORDER — LIDOCAINE HCL (PF) 1 % IJ SOLN
INTRAMUSCULAR | Status: AC
  Filled 2018-09-14: qty 30

## 2018-09-14 NOTE — Progress Notes (Addendum)
[facility] Progress Note   Subjective:  Seen in room. Denies dyspnea. For regular HD today - at first was refusing, but the begrudgingly agrees as long as can have a working TV while on HD.   Objective Vitals:   09/13/18 1956 09/13/18 2201 09/14/18 0621 09/14/18 0752  BP: 129/88 102/65 125/78 99/65  Pulse: 72 70 69 70  Resp: 16  18 16   Temp: 98.6 F (37 C)  98.3 F (36.8 C) 99 F (37.2 C)  TempSrc: Oral  Oral Oral  SpO2: 100%  100% 93%  Weight:   79 kg   Height:       Physical Exam General: Well appearing man, NAD. On nasal oxygen. Heart: RRR; no murmur Lungs: Faint crackles R base, otherwise clear Abdomen: soft, slightly distended Extremities: No LE edema Dialysis Access: RUE AVF + bruit  Additional Objective Labs: Basic Metabolic Panel: Recent Labs  Lab 09/13/18 0701 09/13/18 1156 09/14/18 0237  NA 137 137 135  K 4.2 4.3 4.6  CL 94* 95* 95*  CO2 33* 32 31  GLUCOSE 71 92 77  BUN 19 20 16   CREATININE 5.48* 5.74* 4.76*  CALCIUM 8.1* 8.2* 8.0*  PHOS 3.1 3.2  --    Liver Function Tests: Recent Labs  Lab 09/13/18 0701 09/13/18 1156  ALBUMIN 2.9* 3.2*   CBC: Recent Labs  Lab 09/12/18 2154 09/13/18 0901 09/13/18 1156  WBC 3.9* 4.8 4.1  HGB 9.5* 9.7* 10.2*  HCT 31.1* 33.1* 34.2*  MCV 103.7* 106.1* 106.5*  PLT 68* 69* 63*   Blood Culture    Component Value Date/Time   SDES BLOOD RIGHT HAND 09/13/2018 1702   SPECREQUEST  09/13/2018 1702    BOTTLES DRAWN AEROBIC ONLY Blood Culture results may not be optimal due to an inadequate volume of blood received in culture bottles   CULT  09/13/2018 1702    NO GROWTH < 24 HOURS Performed at [facility] [address] [place], [place] [address]    REPTSTATUS PENDING 09/13/2018 1702   Studies/Results: Dg Chest 2 View  Result Date: 09/12/2018 CLINICAL DATA:  Chest pain EXAM: CHEST - 2 VIEW COMPARISON:  09/03/2018 FINDINGS: Post sternotomy changes. Left-sided pacing device as before.  Cardiomegaly with vascular congestion and mild perihilar ground-glass opacity. No pneumothorax. IMPRESSION: Low lung volumes. Cardiomegaly with vascular congestion and mild bilateral ground-glass opacity, possible edema Electronically Signed   By: Donavan Foil M.D.   On: 09/12/2018 22:24   US Abdomen Limited  Result Date: 09/13/2018 CLINICAL DATA:  Assess ascites. History of recurrent ascites and last paracentesis 09/11/2018. EXAM: LIMITED ABDOMEN ULTRASOUND FOR ASCITES TECHNIQUE: Limited ultrasound survey for ascites was performed in all four abdominal quadrants. COMPARISON:  09/11/2018 FINDINGS: There is a moderate amount of fluid in the LOWER quadrants bilaterally. Small amount of fluid identified in the UPPER quadrants bilaterally. IMPRESSION: Recurrent ascites. Electronically Signed   By: Nolon Nations M.D.   On: 09/13/2018 18:26   Medications: . sodium chloride     . aspirin EC  81 mg Oral Daily  . calcium acetate  667 mg Oral TID WC  . Chlorhexidine Gluconate Cloth  6 each Topical Q0600  . cinacalcet  30 mg Oral Q supper  . gabapentin  300 mg Oral BID  . metoprolol tartrate  25 mg Oral BID  . multivitamin  1 tablet Oral QHS  . pantoprazole  40 mg Oral Daily  . rosuvastatin  20 mg Oral q1800  . sodium chloride flush  3 mL Intravenous Q12H    Dialysis Orders: TTS at [facility] 3:30hr, 400/A1.5, EDW 81kg, 2K/2Ca, UFP #4, AVF, no heparin - Hectoral 30mcg IV q HD - Mircera 222mcg IV q 2 weeks (last 8/18)  Assessment/Plan: 1. Dyspnea/Pulm edema: Extra HD 8/21 for volume - improved symptoms. 2. ESRD: Usual TTS schedule, s/p extra HD yesterday - back to usual now - HD later today. 3. HTN/volume: Challenging EDW as tolerated - got to 79.4kg yesterday, push down further today. 4. Anemia: Hgb 10.2 - not due for ESA yet. 5. Secondary hyperparathyroidism: Ca/Phos ok. Continue binders and VDRA. 6. Nutrition: Alb low, start pro-stat 7. Thrombocytopenia: Chronic - no heparin with HD.  8.  Myoclonic jerking: Noted to be an issue during HD in recent weeks, gabapentin recently reduced to QD dosing - will adjust schedule here.  Veneta Penton, PA-C 09/14/2018, 10:34 AM  [facility] Pager: [phone]  Pt seen, examined and agree w A/P as above.  Kelly Splinter  MD 09/14/2018, 11:18 AM

## 2018-09-14 NOTE — Procedures (Signed)
PROCEDURE SUMMARY:  Successful US guided paracentesis from right lateral abdomen.  Yielded 1.8 liters of yellow fluid.  No immediate complications.  Pt tolerated well.   Specimen was sent for labs.  EBL < 34mL  Docia Barrier PA-C 09/14/2018 1:53 PM

## 2018-09-14 NOTE — Progress Notes (Signed)
PROGRESS NOTE    Eric [name]  XAJ:[id number] DOB: [birth year] DOA: 09/12/2018 PCP: System, Pcp Not In   Brief Narrative:  Eric [name] is a 63 y.o. male with medical history significant of ESRD on dialysis TTS, CHF, CAD, HTN, AICD following a prior cardiac arrest, HCV with cirrhosis who presented to the ED with c/o chest pressure, radiates to arms, legs, and back.  Onset around 9pm on the night of 09/12/2018.  This was associated with some shortness of breath but no nausea, vomiting, radiation to the neck and shoulder or any diaphoresis.  Received Tylenol, neurontin as well as hydralazine and imdur (which he apparently takes PRN for BP) for SBP 150.  Pain hasnt really improved.  Does have associated SOB.  ED Course: BNP 2881, CXR suggestive of mild pulmonary edema.EKG shows TWI in inferior and lateral leads which are possibly new.  Troponins however have remained around 38/39 and are stable.  BMP showed creatinine of 4.7.  Electrolytes were stable.  Chest x-ray showed cardiomegaly with pulmonary edema and mild bilateral groundglass opacity.  COVID-19 negative.  Patient was admitted under hospitalist service for further management.  Nephrology was consulted for dialysis.  Due to some bleeding at the AV fistula, vascular surgery was consulted and patient is scheduled to have fistula plication on Monday.  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  For T wave inversion in lateral leads.   Troponin only slightly elevated around 39 but flat.  This is likely demand ischemia and not a sign of ACS.  Continue to monitor on telemetry.  This could also be due to pulmonary edema.  He is going to have dialysis today.  I do not see any indication for cardiology consult at this point in time however if he continues to have chest pain despite of having dialysis then we may consider consulting them.  Acute hypoxic respiratory failure secondary to acute pulmonary edema/acute on chronic combined systolic congestive heart failure: Lasix on hold.  He takes that as needed on off dialysis.  He is going to have dialysis today.  Hopefully that will improve his symptoms.  Currently on 2 L of oxygen however saturating over 95% and sometimes 100%.  I am sure this can be easily tapered off.  I will order specific order for the nurses to try to taper off and incentive spirometry.   Essential hypertension: Blood pressure stable and on low normal side.  Continue metoprolol twice daily.  Apparently he takes Imdur and hydralazine as needed at home.  Currently blood pressure stable.  Continue PRN hydralazine.  No Imdur.  ESRD on HHS: Fall is TTS schedule.  Nephrology consulted.  Another dialysis session today.  History of liver cirrhosis/recurrent ascites: Patient has presented to ED twice in last 1 month with almost similar symptoms.  He ended up having diagnostic paracentesis on 08/20/2018 which was unremarkable for any infection.  He returned back to ER on 09/11/2018 and ended up having another therapeutic paracentesis with removal of 3 L.  Currently  seems to have more ascites which is confirmed with abdominal ultrasound.  I have consulted IR for therapeutic paracentesis.  Chronic thrombocytopenia: Secondary to liver cirrhosis.  Stable.  No signs of bleeding.  Avoid heparin products.  DVT prophylaxis: SCD/avoiding heparin products due to thrombocytopenia Code Status: Full code Family Communication: None  present Disposition Plan: Will be discharged hopefully on Monday after plication of fistula.  Consultants:   Nephrology  Procedures:   None  Antimicrobials:   None   Subjective: Patient seen and examined.  Still complains of abdominal pain which is referred to chest.  Denied any shortness of breath.  Not comfortable going home.  Objective: Vitals:   09/14/18 0752 09/14/18 1057 09/14/18 1205 09/14/18 1215  BP: 99/65 100/69 112/78 115/77  Pulse: 70 70    Resp: 16 20    Temp: 99 F (37.2 C) 99.7 F (37.6 C)    TempSrc: Oral Oral    SpO2: 93% 95%    Weight:      Height:        Intake/Output Summary (Last 24 hours) at 09/14/2018 1311 Last data filed at 09/14/2018 0750 Gross per 24 hour  Intake 343 ml  Output 1800 ml  Net -1457 ml   Filed Weights   09/13/18 1235 09/13/18 1545 09/14/18 0621  Weight: 81.1 kg 79.4 kg 79 kg    Examination:  General exam: Appears calm and comfortable  Respiratory system: Diminished breath sounds. Respiratory effort normal. Cardiovascular system: S1 & S2 heard, RRR. No JVD, murmurs, rubs, gallops or clicks. No pedal edema. Gastrointestinal system: Abdomen is distended and nontender and firm with positive fluid shift. No organomegaly or masses felt. Normal bowel sounds heard. Central nervous system: Alert and oriented. No focal neurological deficits. Extremities: Symmetric 5 x 5 power. Skin: No rashes, lesions or ulcers Psychiatry: Judgement and insight appear poor mood & affect appropriate.   Data Reviewed: I have personally reviewed following labs and imaging studies  CBC: Recent Labs  Lab 09/12/18 2154 09/13/18 0901 09/13/18 1156  WBC 3.9* 4.8 4.1  HGB 9.5* 9.7* 10.2*  HCT 31.1* 33.1* 34.2*  MCV 103.7* 106.1* 106.5*  PLT 68* 69* 63*   Basic Metabolic Panel: Recent Labs  Lab 09/12/18 2154 09/13/18 0701 09/13/18 1156 09/14/18 0237  NA 140 137 137 135  K 3.8 4.2 4.3 4.6  CL 92* 94* 95* 95*  CO2 36* 33* 32 31  GLUCOSE  94 71 92 77  BUN 13 19 20 16   CREATININE 4.70* 5.48* 5.74* 4.76*  CALCIUM 8.4* 8.1* 8.2* 8.0*  PHOS  --  3.1 3.2  --    GFR: Estimated Creatinine Clearance: 15.5 mL/min (A) (by C-G formula based on SCr of 4.76 mg/dL (H)). Liver Function Tests: Recent Labs  Lab 09/13/18 0701 09/13/18 1156  ALBUMIN 2.9* 3.2*   No results for input(s): LIPASE, AMYLASE in the last 168 hours. No results for input(s): AMMONIA in the last 168 hours. Coagulation Profile: No results for input(s): INR, PROTIME in the last 168 hours. Cardiac Enzymes: No results for input(s): CKTOTAL, CKMB, CKMBINDEX, TROPONINI in the last 168 hours. BNP (last 3 results) No results for input(s): PROBNP in the last 8760 hours. HbA1C: No results for input(s): HGBA1C in the last 72 hours. CBG: Recent Labs  Lab 09/13/18 1117 09/13/18 1650 09/13/18 2100 09/14/18 0750 09/14/18 1059  GLUCAP 80 96 195* 85 108*   Lipid Profile: No results for input(s): CHOL, HDL, LDLCALC, TRIG, CHOLHDL, LDLDIRECT in the last 72 hours. Thyroid  Function Tests: No results for input(s): TSH, T4TOTAL, FREET4, T3FREE, THYROIDAB in the last 72 hours. Anemia Panel: No results for input(s): VITAMINB12, FOLATE, FERRITIN, TIBC, IRON, RETICCTPCT in the last 72 hours. Sepsis Labs: No results for input(s): PROCALCITON, LATICACIDVEN in the last 168 hours.  Recent Results (from the past 240 hour(s))  SARS CORONAVIRUS 2     Status: None   Collection Time: 09/12/18 11:09 PM  Result Value Ref Range Status   SARS Coronavirus 2 NEGATIVE NEGATIVE Final    Comment: (NOTE) SARS-CoV-2 target nucleic acids are NOT DETECTED. The SARS-CoV-2 RNA is generally detectable in upper and lower respiratory specimens during the acute phase of infection. Negative results do not preclude SARS-CoV-2 infection, do not rule out co-infections with other pathogens, and should not be used as the sole basis for treatment or other patient management decisions. Negative results  must be combined with clinical observations, patient history, and epidemiological information. The expected result is Negative. Fact Sheet for Patients: SugarRoll.be Fact Sheet for Healthcare Providers: https://www.woods-mathews.com/ This test is not yet approved or cleared by the [place] FDA and  has been authorized for detection and/or diagnosis of SARS-CoV-2 by FDA under an Emergency Use Authorization (EUA). This EUA will remain  in effect (meaning this test can be used) for the duration of the COVID-19 declaration under Section 56 4(b)(1) of the Act, 21 U.S.C. section 360bbb-3(b)(1), unless the authorization is terminated or revoked sooner. Performed at [facility], [place] [address], [place], [place] [address]   Culture, blood (routine x 2)     Status: None (Preliminary result)   Collection Time: 09/13/18  5:01 PM   Specimen: BLOOD LEFT HAND  Result Value Ref Range Status   Specimen Description BLOOD LEFT HAND  Final   Special Requests   Final    BOTTLES DRAWN AEROBIC ONLY Blood Culture results may not be optimal due to an inadequate volume of blood received in culture bottles   Culture   Final    NO GROWTH < 24 HOURS Performed at [facility], [place] [address] [place], [place] [address]    Report Status PENDING  Incomplete  Culture, blood (routine x 2)     Status: None (Preliminary result)   Collection Time: 09/13/18  5:02 PM   Specimen: BLOOD RIGHT HAND  Result Value Ref Range Status   Specimen Description BLOOD RIGHT HAND  Final   Special Requests   Final    BOTTLES DRAWN AEROBIC ONLY Blood Culture results may not be optimal due to an inadequate volume of blood received in culture bottles   Culture   Final    NO GROWTH < 24 HOURS Performed at [facility], [place] [address], [place], [place] [address]    Report Status PENDING  Incomplete      Radiology Studies: Dg Chest 2 View  Result Date:  09/12/2018 CLINICAL DATA:  Chest pain EXAM: CHEST - 2 VIEW COMPARISON:  09/03/2018 FINDINGS: Post sternotomy changes. Left-sided pacing device as before. Cardiomegaly with vascular congestion and mild perihilar ground-glass opacity. No pneumothorax. IMPRESSION: Low lung volumes. Cardiomegaly with vascular congestion and mild bilateral ground-glass opacity, possible edema Electronically Signed   By: Donavan Foil M.D.   On: 09/12/2018 22:24   US Abdomen Limited  Result Date: 09/13/2018 CLINICAL DATA:  Assess ascites. History of recurrent ascites and last paracentesis 09/11/2018. EXAM: LIMITED ABDOMEN ULTRASOUND FOR ASCITES TECHNIQUE: Limited ultrasound survey for ascites was performed in all four abdominal quadrants.  COMPARISON:  09/11/2018 FINDINGS: There is a moderate amount of fluid in the LOWER quadrants bilaterally. Small amount of fluid identified in the UPPER quadrants bilaterally. IMPRESSION: Recurrent ascites. Electronically Signed   By: Nolon Nations M.D.   On: 09/13/2018 18:26    Scheduled Meds:  aspirin EC  81 mg Oral Daily   calcium acetate  667 mg Oral TID WC   Chlorhexidine Gluconate Cloth  6 each Topical Q0600   cinacalcet  30 mg Oral Q supper   doxercalciferol  2 mcg Intravenous Q T,Th,Sa-HD   feeding supplement (PRO-STAT SUGAR FREE 64)  30 mL Oral BID   [START ON 09/15/2018] gabapentin  300 mg Oral QHS   lidocaine (PF)       metoprolol tartrate  25 mg Oral BID   multivitamin  1 tablet Oral QHS   pantoprazole  40 mg Oral Daily   rosuvastatin  20 mg Oral q1800   sodium chloride flush  3 mL Intravenous Q12H   Continuous Infusions:  sodium chloride       LOS: 1 day   Time spent: 30 minutes   Darliss Cheney, MD [facility] Pager [phone]  If 7PM-7AM, please contact night-coverage [email or web address] Password [email or web address] 09/14/2018, 1:11 PM

## 2018-09-14 NOTE — Progress Notes (Signed)
PHARMACY - PHYSICIAN COMMUNICATION CRITICAL VALUE ALERT - BLOOD CULTURE IDENTIFICATION (BCID)  Eric [name] is an 63 y.o. male who presented to [facility] on 09/12/2018 with a chief complaint of chest pressure  Assessment: blood cultures with GPC in 1/2 bottles. BCID results show staph species (methicillin resistance detected; likely MRSE and likely contaminant)  Name of physician (or Provider) Contacted: Bodenheimer NP  Current antibiotics: None  Changes to prescribed antibiotics recommended:  -Antibiotics are not needed  Results for orders placed or performed during the hospital encounter of 09/12/18  Blood Culture ID Panel (Reflexed) (Collected: 09/13/2018  5:02 PM)  Result Value Ref Range   Enterococcus species NOT DETECTED NOT DETECTED   Listeria monocytogenes NOT DETECTED NOT DETECTED   Staphylococcus species DETECTED (A) NOT DETECTED   Staphylococcus aureus (BCID) NOT DETECTED NOT DETECTED   Methicillin resistance DETECTED (A) NOT DETECTED   Streptococcus species NOT DETECTED NOT DETECTED   Streptococcus agalactiae NOT DETECTED NOT DETECTED   Streptococcus pneumoniae NOT DETECTED NOT DETECTED   Streptococcus pyogenes NOT DETECTED NOT DETECTED   Acinetobacter baumannii NOT DETECTED NOT DETECTED   Enterobacteriaceae species NOT DETECTED NOT DETECTED   Enterobacter cloacae complex NOT DETECTED NOT DETECTED   Escherichia coli NOT DETECTED NOT DETECTED   Klebsiella oxytoca NOT DETECTED NOT DETECTED   Klebsiella pneumoniae NOT DETECTED NOT DETECTED   Proteus species NOT DETECTED NOT DETECTED   Serratia marcescens NOT DETECTED NOT DETECTED   Haemophilus influenzae NOT DETECTED NOT DETECTED   Neisseria meningitidis NOT DETECTED NOT DETECTED   Pseudomonas aeruginosa NOT DETECTED NOT DETECTED   Candida albicans NOT DETECTED NOT DETECTED   Candida glabrata NOT DETECTED NOT DETECTED   Candida krusei NOT DETECTED NOT DETECTED   Candida parapsilosis NOT DETECTED NOT DETECTED   Candida tropicalis NOT DETECTED NOT DETECTED    Hildred Laser, PharmD Clinical Pharmacist **Pharmacist phone directory can now be found on [email or web address] (PW [email or web address]).  Listed under [place].

## 2018-09-14 NOTE — Progress Notes (Signed)
Sharee Pimple Tsoutis, RN, called nad informed that this pt HD tx has been moved to 09/15/2018

## 2018-09-15 LAB — CBC
HCT: 31.2 % — ABNORMAL LOW (ref 39.0–52.0)
Hemoglobin: 9.4 g/dL — ABNORMAL LOW (ref 13.0–17.0)
MCH: 31.2 pg (ref 26.0–34.0)
MCHC: 30.1 g/dL (ref 30.0–36.0)
MCV: 103.7 fL — ABNORMAL HIGH (ref 80.0–100.0)
Platelets: 56 10*3/uL — ABNORMAL LOW (ref 150–400)
RBC: 3.01 MIL/uL — ABNORMAL LOW (ref 4.22–5.81)
RDW: 15.4 % (ref 11.5–15.5)
WBC: 5 10*3/uL (ref 4.0–10.5)
nRBC: 0 % (ref 0.0–0.2)

## 2018-09-15 LAB — BASIC METABOLIC PANEL
Anion gap: 10 (ref 5–15)
BUN: 29 mg/dL — ABNORMAL HIGH (ref 8–23)
CO2: 28 mmol/L (ref 22–32)
Calcium: 7.9 mg/dL — ABNORMAL LOW (ref 8.9–10.3)
Chloride: 93 mmol/L — ABNORMAL LOW (ref 98–111)
Creatinine, Ser: 6.58 mg/dL — ABNORMAL HIGH (ref 0.61–1.24)
GFR calc Af Amer: 9 mL/min — ABNORMAL LOW (ref >60)
GFR calc non Af Amer: 8 mL/min — ABNORMAL LOW (ref >60)
Glucose, Bld: 89 mg/dL (ref 70–99)
Potassium: 4.7 mmol/L (ref 3.5–5.1)
Sodium: 131 mmol/L — ABNORMAL LOW (ref 135–145)

## 2018-09-15 LAB — GLUCOSE, CAPILLARY
Glucose-Capillary: 80 mg/dL (ref 70–99)
Glucose-Capillary: 105 mg/dL — ABNORMAL HIGH (ref 70–99)

## 2018-09-15 MED ORDER — LIDOCAINE HCL (PF) 1 % IJ SOLN: 5.0000 mL | INTRAMUSCULAR | Status: DC | PRN

## 2018-09-15 MED ORDER — LIDOCAINE-PRILOCAINE 2.5-2.5 % EX CREA: 1.0000 "application " | TOPICAL_CREAM | CUTANEOUS | Status: DC | PRN

## 2018-09-15 MED ORDER — SODIUM CHLORIDE 0.9 % IV SOLN: 100.0000 mL | INTRAVENOUS | Status: DC | PRN

## 2018-09-15 MED ORDER — HEPARIN SODIUM (PORCINE) 1000 UNIT/ML DIALYSIS: 1000.0000 [IU] | INTRAMUSCULAR | Status: DC | PRN

## 2018-09-15 MED ORDER — PENTAFLUOROPROP-TETRAFLUOROETH EX AERO: 1.0000 "application " | INHALATION_SPRAY | CUTANEOUS | Status: DC | PRN

## 2018-09-15 MED ORDER — CEFAZOLIN SODIUM-DEXTROSE 2-4 GM/100ML-% IV SOLN
2.0000 g | INTRAVENOUS | Status: AC
  Filled 2018-09-15 (×2): qty 100

## 2018-09-15 MED ORDER — ALTEPLASE 2 MG IJ SOLR: 2.0000 mg | Freq: Once | INTRAMUSCULAR | Status: DC | PRN

## 2018-09-15 NOTE — Progress Notes (Signed)
[facility]  Subjective  -no complaints.  Seen in dialysis.   Objective (!) 93/52 69 98.6 F (37 C) (Oral) 16 96%  Intake/Output Summary (Last 24 hours) at 09/15/2018 5732 Last data filed at 09/14/2018 2150 Gross per 24 hour  Intake 243 ml  Output -  Net 243 ml    Right brachiocephalic AV fistula with good thrill.  Able to access the fistula below an ulcerated segment on the upper arm in dialysis.  No appreciable pulse at the wrist but motor and sensory intact.      Laboratory Lab Results: Recent Labs    09/13/18 1156 09/15/18 0851  WBC 4.1 5.0  HGB 10.2* 9.4*  HCT 34.2* 31.2*  PLT 63* 56*   BMET Recent Labs    09/14/18 0237 09/15/18 0414  NA 135 131*  K 4.6 4.7  CL 95* 93*  CO2 31 28  GLUCOSE 77 89  BUN 16 29*  CREATININE 4.76* 6.58*  CALCIUM 8.0* 7.9*    COAG Lab Results  Component Value Date   INR 1.7 (H) 08/03/2018   INR 1.33 01/03/2018   INR 1.58 (H) 06/14/2015   No results found for: PTT  Assessment/Planning:  63 year old male with end-stage renal disease that vascular surgery was asked to evaluate ulcerated segment on the right arm AV fistula on Friday.  Dialysis has been able to access away from the ulcerated segment through the weekend so he can get dialyzed accordingly.  He is on the schedule tomorrow for fistula revision with plication.  He has had several segments of this plicated in the past by Dr. Donzetta Matters last year.  Discussed that some point fistula may become unusable but we will attempt salvage.  Please keep him n.p.o. after midnight.  Consent ordered in the chart.  Marty Heck 09/15/2018 9:22 AM --

## 2018-09-15 NOTE — Progress Notes (Addendum)
[facility] Progress Note   Subjective:  Seen on HD (rolled over from yesterday) - 3L UF goal. Tolerating without issues. Says did have some mild dyspnea overnight. Vascular surgery planning for AVF plication tomorrow.  Objective Vitals:   09/15/18 0830 09/15/18 0845 09/15/18 0900 09/15/18 0930  BP: (!) 87/55 (!) 100/59 (!) 93/52 (!) 97/52  Pulse: 67 69 69 69  Resp:      Temp:      TempSrc:      SpO2:      Weight:      Height:       Physical Exam General: Well appearing man, NAD. On nasal oxygen. Heart: RRR; no murmur Lungs: CTA anteriorly Abdomen: soft, slightly distended Extremities: No LE edema Dialysis Access: RUE AVF + bruit, chronic ulcerated area noted.   Additional Objective Labs: Basic Metabolic Panel: Recent Labs  Lab 09/13/18 0701 09/13/18 1156 09/14/18 0237 09/15/18 0414  NA 137 137 135 131*  K 4.2 4.3 4.6 4.7  CL 94* 95* 95* 93*  CO2 33* 32 31 28  GLUCOSE 71 92 77 89  BUN 19 20 16  29*  CREATININE 5.48* 5.74* 4.76* 6.58*  CALCIUM 8.1* 8.2* 8.0* 7.9*  PHOS 3.1 3.2  --   --    Liver Function Tests: Recent Labs  Lab 09/13/18 0701 09/13/18 1156  ALBUMIN 2.9* 3.2*   CBC: Recent Labs  Lab 09/12/18 2154 09/13/18 0901 09/13/18 1156 09/15/18 0851  WBC 3.9* 4.8 4.1 5.0  HGB 9.5* 9.7* 10.2* 9.4*  HCT 31.1* 33.1* 34.2* 31.2*  MCV 103.7* 106.1* 106.5* 103.7*  PLT 68* 69* 63* 56*   CBG: Recent Labs  Lab 09/13/18 2100 09/14/18 0750 09/14/18 1059 09/14/18 1619 09/14/18 2124  GLUCAP 195* 85 108* 100* 121*   Studies/Results: US Abdomen Limited  Result Date: 09/13/2018 CLINICAL DATA:  Assess ascites. History of recurrent ascites and last paracentesis 09/11/2018. EXAM: LIMITED ABDOMEN ULTRASOUND FOR ASCITES TECHNIQUE: Limited ultrasound survey for ascites was performed in all four abdominal quadrants. COMPARISON:  09/11/2018 FINDINGS: There is a moderate amount of fluid in the LOWER quadrants bilaterally. Small amount of fluid  identified in the UPPER quadrants bilaterally. IMPRESSION: Recurrent ascites. Electronically Signed   By: Nolon Nations M.D.   On: 09/13/2018 18:26   US Paracentesis  Result Date: 09/14/2018 INDICATION: Patient with history of end-stage renal disease on dialysis, HCV with cirrhosis, recurrent ascites. Request is made for diagnostic and therapeutic paracentesis. EXAM: ULTRASOUND GUIDED DIAGNOSTIC AND THERAPEUTIC PARACENTESIS MEDICATIONS: 10 mL 1% lidocaine COMPLICATIONS: None immediate. PROCEDURE: Informed written consent was obtained from the patient after a discussion of the risks, benefits and alternatives to treatment. A timeout was performed prior to the initiation of the procedure. Initial ultrasound scanning demonstrates a small amount of ascites within the right lower abdominal quadrant. The right lower abdomen was prepped and draped in the usual sterile fashion. 1% lidocaine was used for local anesthesia. Following this, a 19 gauge, 7-cm, Yueh catheter was introduced. An ultrasound image was saved for documentation purposes. The paracentesis was performed. The catheter was removed and a dressing was applied. The patient tolerated the procedure well without immediate post procedural complication. FINDINGS: A total of approximately 1.8 liters of yellow fluid was removed. Samples were sent to the laboratory as requested by the clinical team. IMPRESSION: Successful ultrasound-guided diagnostic and therapeutic paracentesis yielding 1.8 liters of peritoneal fluid. Read by: Brynda Greathouse PA-C Electronically Signed   By: Aletta Edouard M.D.   On: 09/14/2018 13:52  Medications: . sodium chloride    . sodium chloride    . sodium chloride    . [START ON 09/16/2018]  ceFAZolin (ANCEF) IV     . aspirin EC  81 mg Oral Daily  . calcium acetate  667 mg Oral TID WC  . cinacalcet  30 mg Oral Q supper  . doxercalciferol  2 mcg Intravenous Q T,Th,Sa-HD  . feeding supplement (PRO-STAT SUGAR FREE 64)  30 mL  Oral BID  . gabapentin  300 mg Oral QHS  . metoprolol tartrate  25 mg Oral BID  . multivitamin  1 tablet Oral QHS  . pantoprazole  40 mg Oral Daily  . rosuvastatin  20 mg Oral q1800  . sodium chloride flush  3 mL Intravenous Q12H    Dialysis Orders: TTS at [facility] 3:30hr, 400/A1.5, EDW 81kg, 2K/2Ca, UFP #4, AVF, no heparin - Hectoral 64mcg IV q HD - Mircera 228mcg IV q 2 weeks (last 8/18)  Assessment/Plan: 1. Dyspnea/Pulm edema: Extra HD 8/21 for volume - improved symptoms. 2. ESRD: Usual TTS schedule, HD today (bumped from yesterday d/t ^ census). Next HD 8/25 if still inpatient. 3. HTN/volume: Challenging EDW as tolerated, 3L UF goal today. 4. Anemia: Hgb 9.4 - not due for ESA yet. 5. Secondary hyperparathyroidism: Ca/Phos ok. Continue binders and VDRA. 6. Nutrition: Alb low, continue pro-stat 7. Thrombocytopenia: Chronic - no heparin with HD.  8. AVF ulceration: VVS following, for AVF revision/plication on 9/35.  Veneta Penton, PA-C 09/15/2018, 10:09 AM  [facility] Pager: [phone]  Pt seen, examined and agree w A/P as above.  Kelly Splinter  MD 09/15/2018, 12:03 PM

## 2018-09-15 NOTE — H&P (View-Only) (Signed)
[facility]  Subjective  -no complaints.  Seen in dialysis.   Objective (!) 93/52 69 98.6 F (37 C) (Oral) 16 96%  Intake/Output Summary (Last 24 hours) at 09/15/2018 4695 Last data filed at 09/14/2018 2150 Gross per 24 hour  Intake 243 ml  Output -  Net 243 ml    Right brachiocephalic AV fistula with good thrill.  Able to access the fistula below an ulcerated segment on the upper arm in dialysis.  No appreciable pulse at the wrist but motor and sensory intact.      Laboratory Lab Results: Recent Labs    09/13/18 1156 09/15/18 0851  WBC 4.1 5.0  HGB 10.2* 9.4*  HCT 34.2* 31.2*  PLT 63* 56*   BMET Recent Labs    09/14/18 0237 09/15/18 0414  NA 135 131*  K 4.6 4.7  CL 95* 93*  CO2 31 28  GLUCOSE 77 89  BUN 16 29*  CREATININE 4.76* 6.58*  CALCIUM 8.0* 7.9*    COAG Lab Results  Component Value Date   INR 1.7 (H) 08/03/2018   INR 1.33 01/03/2018   INR 1.58 (H) 06/14/2015   No results found for: PTT  Assessment/Planning:  63 year old male with end-stage renal disease that vascular surgery was asked to evaluate ulcerated segment on the right arm AV fistula on Friday.  Dialysis has been able to access away from the ulcerated segment through the weekend so he can get dialyzed accordingly.  He is on the schedule tomorrow for fistula revision with plication.  He has had several segments of this plicated in the past by Dr. Donzetta Matters last year.  Discussed that some point fistula may become unusable but we will attempt salvage.  Please keep him n.p.o. after midnight.  Consent ordered in the chart.  Marty Heck 09/15/2018 9:22 AM --

## 2018-09-15 NOTE — Progress Notes (Signed)
Pt. Signed off hemodialysis treatment with 1 hour remaining due to need to [place] restroom. This nurse offered bedpan x2 pt. Declined. Ralene Muskrat PA made aware ok to stop treatment and return pt. To room

## 2018-09-15 NOTE — Progress Notes (Signed)
PROGRESS NOTE    Eric [name]  OVF:[id number] DOB: [birth year] DOA: 09/12/2018 PCP: System, Pcp Not In   Brief Narrative:  Eric [name] is a 63 y.o. male with medical history significant of ESRD on dialysis TTS, CHF, CAD, HTN, AICD following a prior cardiac arrest, HCV with cirrhosis who presented to the ED with c/o chest pressure, radiates to arms, legs, and back.  Onset around 9pm on the night of 09/12/2018.  This was associated with some shortness of breath but no nausea, vomiting, radiation to the neck and shoulder or any diaphoresis.  Received Tylenol, neurontin as well as hydralazine and imdur (which he apparently takes PRN for BP) for SBP 150.  Pain hasnt really improved.  Does have associated SOB.  ED Course: BNP 2881, CXR suggestive of mild pulmonary edema.EKG shows TWI in inferior and lateral leads which are possibly new.  Troponins however have remained around 38/39 and are stable.  BMP showed creatinine of 4.7.  Electrolytes were stable.  Chest x-ray showed cardiomegaly with pulmonary edema and mild bilateral groundglass opacity.  COVID-19 negative.  Patient was admitted under hospitalist service for further management.  Nephrology was consulted for dialysis.  Due to some bleeding at the AV fistula, vascular surgery was consulted and patient is scheduled to have fistula plication on Monday.  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  For T wave inversion in lateral leads.   Troponin only slightly elevated around 39 but flat.  This is likely demand ischemia and not a sign of ACS.  Continue to monitor on telemetry.  This could also be due to pulmonary edema.  He is going to have dialysis today.  I do not see any indication for cardiology consult at this point in time however if he continues to have chest pain despite of having dialysis then we may consider consulting them.  Acute hypoxic respiratory failure secondary to acute pulmonary edema/acute on chronic combined systolic congestive heart failure: Lasix on hold.  He takes that as needed on off dialysis.  He is having third session of dialysis today.  He is off of oxygen now and does not have any dyspnea.    Essential hypertension: Blood pressure stable and on low normal side.  Continue metoprolol twice daily.  Apparently he takes Imdur and hydralazine as needed at home.  Currently blood pressure stable.  Continue PRN hydralazine.  No Imdur.  ESRD on HHS: Fall is TTS schedule.  Nephrology consulted.  Another dialysis session today.  History of liver cirrhosis/recurrent ascites: Patient has presented to ED twice in last 1 month with almost similar symptoms.  He ended up having diagnostic paracentesis on 08/20/2018 which was unremarkable for any infection.  He returned back to ER on 09/11/2018 and ended up having another therapeutic paracentesis with removal of 3 L.  Underwent diagnostic and therapeutic paracentesis.  Fluid analysis not indicative of any infection.  Chronic thrombocytopenia: Secondary to liver cirrhosis.  Stable.  No signs of bleeding.  Avoid heparin products.  DVT prophylaxis:  SCD/avoiding heparin products due to thrombocytopenia Code Status: Full code Family Communication: None present Disposition Plan: Will be discharged hopefully on Monday after plication of fistula.  Consultants:   Nephrology  Vascular surgery  Procedures:   Paracentesis on 09/14/2018 with 1.8 L of yellow fluid drained.   Antimicrobials:   None   Subjective: Seen and examined in dialysis.  Still with abdominal pain complaint but I wonder if he always complains of abdominal pain.  Looks very comfortable today.  Objective: Vitals:   09/15/18 0830 09/15/18 0845 09/15/18 0900 09/15/18 0930  BP: (!) 87/55 (!) 100/59 (!) 93/52 (!) 97/52  Pulse: 67 69 69 69  Resp:      Temp:      TempSrc:      SpO2:      Weight:      Height:        Intake/Output Summary (Last 24 hours) at 09/15/2018 1009 Last data filed at 09/14/2018 2150 Gross per 24 hour  Intake 243 ml  Output -  Net 243 ml   Filed Weights   09/14/18 0621 09/15/18 0513 09/15/18 0715  Weight: 79 kg 80.4 kg 81.2 kg    Examination:  General exam: Appears calm and comfortable  Respiratory system: Clear to auscultation. Respiratory effort normal. Cardiovascular system: S1 & S2 heard, RRR. No JVD, murmurs, rubs, gallops or clicks. No pedal edema. Gastrointestinal system: Abdomen is mildly distended, soft and nontender. No organomegaly or masses felt. Normal bowel sounds heard. Central nervous system: Alert and oriented. No focal neurological deficits. Extremities: Symmetric 5 x 5 power. Skin: No rashes, lesions or ulcers Psychiatry: Judgement and insight appear poor, mood & affect flat.  Data Reviewed: I have personally reviewed following labs and imaging studies  CBC: Recent Labs  Lab 09/12/18 2154 09/13/18 0901 09/13/18 1156 09/15/18 0851  WBC 3.9* 4.8 4.1 5.0  HGB 9.5* 9.7* 10.2* 9.4*  HCT 31.1* 33.1* 34.2* 31.2*  MCV 103.7* 106.1* 106.5* 103.7*  PLT 68* 69* 63* 56*   Basic Metabolic Panel: Recent Labs  Lab 09/12/18 2154 09/13/18 0701 09/13/18 1156 09/14/18 0237 09/15/18 0414  NA 140 137 137 135 131*  K 3.8 4.2 4.3 4.6 4.7  CL 92* 94* 95* 95* 93*  CO2 36* 33* 32 31 28  GLUCOSE 94 71 92 77 89  BUN 13 19 20 16  29*  CREATININE 4.70* 5.48* 5.74* 4.76* 6.58*  CALCIUM 8.4* 8.1* 8.2* 8.0* 7.9*  PHOS  --  3.1 3.2  --   --     GFR: Estimated Creatinine Clearance: 11.4 mL/min (A) (by C-G formula based on SCr of 6.58 mg/dL (H)). Liver Function Tests: Recent Labs  Lab 09/13/18 0701 09/13/18 1156  ALBUMIN 2.9* 3.2*   No results for input(s): LIPASE, AMYLASE in the last 168 hours. No results for input(s): AMMONIA in the last 168 hours. Coagulation Profile: No results for input(s): INR, PROTIME in the last 168 hours. Cardiac Enzymes: No results for input(s): CKTOTAL, CKMB, CKMBINDEX, TROPONINI in the last 168 hours. BNP (last 3 results) No results for input(s): PROBNP in the last 8760 hours. HbA1C: No results for input(s): HGBA1C in the last 72 hours. CBG: Recent Labs  Lab 09/13/18 2100 09/14/18 0750 09/14/18 1059 09/14/18 1619 09/14/18 2124  GLUCAP 195* 85 108* 100* 121*   Lipid Profile: No results for input(s): CHOL, HDL, LDLCALC, TRIG, CHOLHDL, LDLDIRECT in the last 72 hours. Thyroid Function Tests: No results for input(s): TSH, T4TOTAL, FREET4, T3FREE, THYROIDAB in the last 72 hours.  Anemia Panel: No results for input(s): VITAMINB12, FOLATE, FERRITIN, TIBC, IRON, RETICCTPCT in the last 72 hours. Sepsis Labs: No results for input(s): PROCALCITON, LATICACIDVEN in the last 168 hours.  Recent Results (from the past 240 hour(s))  SARS CORONAVIRUS 2     Status: None   Collection Time: 09/12/18 11:09 PM  Result Value Ref Range Status   SARS Coronavirus 2 NEGATIVE NEGATIVE Final    Comment: (NOTE) SARS-CoV-2 target nucleic acids are NOT DETECTED. The SARS-CoV-2 RNA is generally detectable in upper and lower respiratory specimens during the acute phase of infection. Negative results do not preclude SARS-CoV-2 infection, do not rule out co-infections with other pathogens, and should not be used as the sole basis for treatment or other patient management decisions. Negative results must be combined with clinical observations, patient history, and epidemiological information. The expected result is  Negative. Fact Sheet for Patients: SugarRoll.be Fact Sheet for Healthcare Providers: https://www.woods-mathews.com/ This test is not yet approved or cleared by the [place] FDA and  has been authorized for detection and/or diagnosis of SARS-CoV-2 by FDA under an Emergency Use Authorization (EUA). This EUA will remain  in effect (meaning this test can be used) for the duration of the COVID-19 declaration under Section 56 4(b)(1) of the Act, 21 U.S.C. section 360bbb-3(b)(1), unless the authorization is terminated or revoked sooner. Performed at [facility], [place] [address] [place], [place] [address]   Culture, blood (routine x 2)     Status: None (Preliminary result)   Collection Time: 09/13/18  5:01 PM   Specimen: BLOOD LEFT HAND  Result Value Ref Range Status   Specimen Description BLOOD LEFT HAND  Final   Special Requests   Final    BOTTLES DRAWN AEROBIC ONLY Blood Culture results may not be optimal due to an inadequate volume of blood received in culture bottles   Culture  Setup Time AEROBIC BOTTLE ONLY GRAM POSITIVE COCCI   Final   Culture   Final    NO GROWTH 2 DAYS Performed at [facility], [place] [address] [place], [place] [address]    Report Status PENDING  Incomplete  Culture, blood (routine x 2)     Status: None (Preliminary result)   Collection Time: 09/13/18  5:02 PM   Specimen: BLOOD RIGHT HAND  Result Value Ref Range Status   Specimen Description BLOOD RIGHT HAND  Final   Special Requests   Final    BOTTLES DRAWN AEROBIC ONLY Blood Culture results may not be optimal due to an inadequate volume of blood received in culture bottles   Culture  Setup Time   Final    GRAM POSITIVE COCCI AEROBIC BOTTLE ONLY Organism ID to follow CRITICAL RESULT CALLED TO, READ BACK BY AND VERIFIED WITH: PHARMD A MEYER [id number] MLM Performed at [facility], [place] [address], [place], [place] [address]    Culture GRAM  POSITIVE COCCI  Final   Report Status PENDING  Incomplete  Blood Culture ID Panel (Reflexed)     Status: Abnormal   Collection Time: 09/13/18  5:02 PM  Result Value Ref Range Status   Enterococcus species NOT DETECTED NOT DETECTED Final   Listeria monocytogenes NOT DETECTED NOT DETECTED Final   Staphylococcus species DETECTED (A) NOT DETECTED Final    Comment: Methicillin (oxacillin) resistant coagulase negative staphylococcus. Possible blood culture contaminant (unless isolated from more than one blood culture draw or clinical case suggests pathogenicity). No antibiotic treatment is indicated for blood  culture contaminants. CRITICAL RESULT CALLED TO, READ BACK BY AND VERIFIED WITH: PHARMD A MEYER [id number] 1959 MLM    Staphylococcus aureus (BCID) NOT DETECTED NOT DETECTED Final   Methicillin resistance DETECTED (A) NOT DETECTED Final    Comment: CRITICAL RESULT CALLED TO, READ BACK BY AND VERIFIED WITH: PHARMD A MEYER [id number] 1959 MLM    Streptococcus species NOT DETECTED NOT DETECTED Final   Streptococcus agalactiae NOT DETECTED NOT DETECTED Final   Streptococcus pneumoniae NOT DETECTED NOT DETECTED Final   Streptococcus pyogenes NOT DETECTED NOT DETECTED Final   Acinetobacter baumannii NOT DETECTED NOT DETECTED Final   Enterobacteriaceae species NOT DETECTED NOT DETECTED Final   Enterobacter cloacae complex NOT DETECTED NOT DETECTED Final   Escherichia coli NOT DETECTED NOT DETECTED Final   Klebsiella oxytoca NOT DETECTED NOT DETECTED Final   Klebsiella pneumoniae NOT DETECTED NOT DETECTED Final   Proteus species NOT DETECTED NOT DETECTED Final   Serratia marcescens NOT DETECTED NOT DETECTED Final   Haemophilus influenzae NOT DETECTED NOT DETECTED Final   Neisseria meningitidis NOT DETECTED NOT DETECTED Final   Pseudomonas aeruginosa NOT DETECTED NOT DETECTED Final   Candida albicans NOT DETECTED NOT DETECTED Final   Candida glabrata NOT DETECTED NOT DETECTED Final   Candida krusei  NOT DETECTED NOT DETECTED Final   Candida parapsilosis NOT DETECTED NOT DETECTED Final   Candida tropicalis NOT DETECTED NOT DETECTED Final    Comment: Performed at [facility], [address], [place], [place] [address]  Culture, body fluid-bottle     Status: None (Preliminary result)   Collection Time: 09/14/18 12:46 PM   Specimen: Peritoneal Washings  Result Value Ref Range Status   Specimen Description PERITONEAL  Final   Special Requests NONE  Final   Culture   Final    NO GROWTH < 24 HOURS Performed at [facility], [place] [address] [place], [place] [address]    Report Status PENDING  Incomplete  Gram stain     Status: None   Collection Time: 09/14/18 12:46 PM   Specimen: Peritoneal Washings  Result Value Ref Range Status   Specimen Description PERITONEAL  Final   Special Requests NONE  Final   Gram Stain   Final    MODERATE WBC PRESENT,BOTH PMN AND MONONUCLEAR NO ORGANISMS SEEN Performed at [facility], [address], [place], [place] [address]    Report Status 09/14/2018 FINAL  Final      Radiology Studies: US Abdomen Limited  Result Date: 09/13/2018 CLINICAL DATA:  Assess ascites. History of recurrent ascites and last paracentesis 09/11/2018. EXAM: LIMITED ABDOMEN ULTRASOUND FOR ASCITES TECHNIQUE: Limited ultrasound survey for ascites was performed in all four abdominal quadrants. COMPARISON:  09/11/2018 FINDINGS: There is a moderate amount of fluid in the LOWER quadrants bilaterally. Small amount of fluid identified in the UPPER quadrants bilaterally. IMPRESSION: Recurrent ascites. Electronically Signed   By: Nolon Nations M.D.   On: 09/13/2018 18:26   US Paracentesis  Result Date: 09/14/2018 INDICATION: Patient with history of end-stage renal disease on dialysis, HCV with cirrhosis, recurrent ascites. Request is made for diagnostic and therapeutic paracentesis. EXAM: ULTRASOUND GUIDED DIAGNOSTIC AND THERAPEUTIC PARACENTESIS MEDICATIONS: 10 mL 1%  lidocaine COMPLICATIONS: None immediate. PROCEDURE: Informed written consent was obtained from the patient after a discussion of the risks, benefits and alternatives to treatment. A timeout was performed prior to the initiation of the procedure. Initial ultrasound scanning demonstrates a small amount of ascites within the right lower abdominal  quadrant. The right lower abdomen was prepped and draped in the usual sterile fashion. 1% lidocaine was used for local anesthesia. Following this, a 19 gauge, 7-cm, Yueh catheter was introduced. An ultrasound image was saved for documentation purposes. The paracentesis was performed. The catheter was removed and a dressing was applied. The patient tolerated the procedure well without immediate post procedural complication. FINDINGS: A total of approximately 1.8 liters of yellow fluid was removed. Samples were sent to the laboratory as requested by the clinical team. IMPRESSION: Successful ultrasound-guided diagnostic and therapeutic paracentesis yielding 1.8 liters of peritoneal fluid. Read by: Brynda Greathouse PA-C Electronically Signed   By: Aletta Edouard M.D.   On: 09/14/2018 13:52    Scheduled Meds: . aspirin EC  81 mg Oral Daily  . calcium acetate  667 mg Oral TID WC  . cinacalcet  30 mg Oral Q supper  . doxercalciferol  2 mcg Intravenous Q T,Th,Sa-HD  . feeding supplement (PRO-STAT SUGAR FREE 64)  30 mL Oral BID  . gabapentin  300 mg Oral QHS  . metoprolol tartrate  25 mg Oral BID  . multivitamin  1 tablet Oral QHS  . pantoprazole  40 mg Oral Daily  . rosuvastatin  20 mg Oral q1800  . sodium chloride flush  3 mL Intravenous Q12H   Continuous Infusions: . sodium chloride    . sodium chloride    . sodium chloride    . [START ON 09/16/2018]  ceFAZolin (ANCEF) IV       LOS: 2 days   Time spent: 28 minutes   Darliss Cheney, MD [facility] Pager [phone]  If 7PM-7AM, please contact night-coverage [email or web address] Password [email or web address] 09/15/2018,  10:09 AM

## 2018-09-16 ENCOUNTER — Inpatient Hospital Stay (HOSPITAL_COMMUNITY): Payer: Medicare Other | Admitting: Anesthesiology

## 2018-09-16 ENCOUNTER — Encounter (HOSPITAL_COMMUNITY): Payer: Self-pay | Admitting: Certified Registered Nurse Anesthetist

## 2018-09-16 ENCOUNTER — Encounter (HOSPITAL_COMMUNITY)
Admission: EM | Disposition: A | Discharge status: Home / Self Care | Payer: Self-pay | Source: Home / Self Care | Attending: Family Medicine

## 2018-09-16 HISTORY — PX: REVISON OF ARTERIOVENOUS FISTULA: SHX6074

## 2018-09-16 LAB — GLUCOSE, CAPILLARY
Glucose-Capillary: 88 mg/dL (ref 70–99)
Glucose-Capillary: 89 mg/dL (ref 70–99)
Glucose-Capillary: 78 mg/dL (ref 70–99)
Glucose-Capillary: 89 mg/dL (ref 70–99)
Glucose-Capillary: 101 mg/dL — ABNORMAL HIGH (ref 70–99)

## 2018-09-16 LAB — BASIC METABOLIC PANEL
Anion gap: 15 (ref 5–15)
BUN: 34 mg/dL — ABNORMAL HIGH (ref 8–23)
CO2: 21 mmol/L — ABNORMAL LOW (ref 22–32)
Calcium: 7.9 mg/dL — ABNORMAL LOW (ref 8.9–10.3)
Chloride: 95 mmol/L — ABNORMAL LOW (ref 98–111)
Creatinine, Ser: 6.04 mg/dL — ABNORMAL HIGH (ref 0.61–1.24)
GFR calc Af Amer: 10 mL/min — ABNORMAL LOW (ref >60)
GFR calc non Af Amer: 9 mL/min — ABNORMAL LOW (ref >60)
Glucose, Bld: 77 mg/dL (ref 70–99)
Potassium: 4.7 mmol/L (ref 3.5–5.1)
Sodium: 131 mmol/L — ABNORMAL LOW (ref 135–145)

## 2018-09-16 SURGERY — REVISON OF ARTERIOVENOUS FISTULA
Anesthesia: Monitor Anesthesia Care | Laterality: Right

## 2018-09-16 MED ORDER — FENTANYL CITRATE (PF) 250 MCG/5ML IJ SOLN
INTRAMUSCULAR | Status: AC
  Filled 2018-09-16: qty 5

## 2018-09-16 MED ORDER — PROPOFOL 500 MG/50ML IV EMUL
INTRAVENOUS | Status: DC | PRN
  Administered 2018-09-16: 25 ug/kg/min via INTRAVENOUS
  Administered 2018-09-16: 50 ug/kg/min via INTRAVENOUS

## 2018-09-16 MED ORDER — PROTAMINE SULFATE 10 MG/ML IV SOLN
INTRAVENOUS | Status: DC | PRN
  Administered 2018-09-16: 40 mg via INTRAVENOUS

## 2018-09-16 MED ORDER — VANCOMYCIN HCL 10 G IV SOLR
1750.0000 mg | Freq: Once | INTRAVENOUS | Status: AC
  Administered 2018-09-16: 1750 mg via INTRAVENOUS
  Filled 2018-09-16: qty 1750

## 2018-09-16 MED ORDER — SODIUM CHLORIDE 0.9 % IV SOLN
INTRAVENOUS | Status: DC | PRN
  Administered 2018-09-16: 500 mL

## 2018-09-16 MED ORDER — LIDOCAINE HCL (PF) 1 % IJ SOLN
INTRAMUSCULAR | Status: DC | PRN
  Administered 2018-09-16: 30 mL

## 2018-09-16 MED ORDER — HEPARIN SODIUM (PORCINE) 1000 UNIT/ML IJ SOLN
INTRAMUSCULAR | Status: DC | PRN
  Administered 2018-09-16: 7000 [IU] via INTRAVENOUS

## 2018-09-16 MED ORDER — SODIUM CHLORIDE 0.9 % IR SOLN
Status: DC | PRN
  Administered 2018-09-16: 1000 mL

## 2018-09-16 MED ORDER — CHLORHEXIDINE GLUCONATE CLOTH 2 % EX PADS
6.0000 | MEDICATED_PAD | Freq: Every day | CUTANEOUS | Status: DC
  Administered 2018-09-17 – 2018-09-21 (×3): 6 via TOPICAL

## 2018-09-16 MED ORDER — SODIUM CHLORIDE 0.9 % IV SOLN
INTRAVENOUS | Status: DC
  Administered 2018-09-16 (×2): via INTRAVENOUS

## 2018-09-16 MED ORDER — ONDANSETRON HCL 4 MG/2ML IJ SOLN
INTRAMUSCULAR | Status: DC | PRN
  Administered 2018-09-16: 4 mg via INTRAVENOUS

## 2018-09-16 MED ORDER — PROPOFOL 10 MG/ML IV BOLUS
INTRAVENOUS | Status: AC
  Filled 2018-09-16: qty 20

## 2018-09-16 MED ORDER — SODIUM CHLORIDE 0.9 % IV SOLN
INTRAVENOUS | Status: DC | PRN
  Administered 2018-09-16: 250 mL via INTRAVENOUS

## 2018-09-16 MED ORDER — CEFAZOLIN SODIUM-DEXTROSE 2-3 GM-%(50ML) IV SOLR
INTRAVENOUS | Status: DC | PRN
  Administered 2018-09-16: 2 g via INTRAVENOUS

## 2018-09-16 MED ORDER — MIDAZOLAM HCL 2 MG/2ML IJ SOLN
INTRAMUSCULAR | Status: AC
  Filled 2018-09-16: qty 2

## 2018-09-16 MED ORDER — VANCOMYCIN HCL IN DEXTROSE 1-5 GM/200ML-% IV SOLN
1000.0000 mg | INTRAVENOUS | Status: DC
  Administered 2018-09-17 – 2018-09-19 (×2): 1000 mg via INTRAVENOUS
  Filled 2018-09-16 (×3): qty 200

## 2018-09-16 MED ORDER — LIDOCAINE HCL (PF) 1 % IJ SOLN
INTRAMUSCULAR | Status: AC
  Filled 2018-09-16: qty 30

## 2018-09-16 MED ORDER — SODIUM CHLORIDE 0.9 % IV SOLN
INTRAVENOUS | Status: AC
  Filled 2018-09-16: qty 1.2

## 2018-09-16 MED ORDER — MIDAZOLAM HCL 2 MG/2ML IJ SOLN
INTRAMUSCULAR | Status: DC | PRN
  Administered 2018-09-16: 1 mg via INTRAVENOUS

## 2018-09-16 MED ORDER — BACITRACIN ZINC 500 UNIT/GM EX OINT
TOPICAL_OINTMENT | CUTANEOUS | Status: AC
  Filled 2018-09-16: qty 28.35

## 2018-09-16 MED ORDER — OXYCODONE-ACETAMINOPHEN 5-325 MG PO TABS
1.0000 | ORAL_TABLET | ORAL | Status: DC | PRN
  Administered 2018-09-16: 1 via ORAL
  Administered 2018-09-17 (×3): 2 via ORAL
  Administered 2018-09-18 (×2): 1 via ORAL
  Administered 2018-09-19 – 2018-09-22 (×4): 2 via ORAL
  Filled 2018-09-16: qty 2
  Filled 2018-09-16: qty 1
  Filled 2018-09-16 (×4): qty 2
  Filled 2018-09-16: qty 1
  Filled 2018-09-16 (×2): qty 2
  Filled 2018-09-16: qty 1

## 2018-09-16 MED ORDER — FENTANYL CITRATE (PF) 250 MCG/5ML IJ SOLN
INTRAMUSCULAR | Status: DC | PRN
  Administered 2018-09-16: 50 ug via INTRAVENOUS

## 2018-09-16 SURGICAL SUPPLY — 35 items
ARMBAND PINK RESTRICT EXTREMIT (MISCELLANEOUS) ×3 IMPLANT
BNDG ADH 1X3 SHEER STRL LF (GAUZE/BANDAGES/DRESSINGS) ×2 IMPLANT
BNDG ELASTIC 4X5.8 VLCR STR LF (GAUZE/BANDAGES/DRESSINGS) ×2 IMPLANT
CANISTER SUCT 3000ML PPV (MISCELLANEOUS) ×3 IMPLANT
CANNULA VESSEL 3MM 2 BLNT TIP (CANNULA) ×3 IMPLANT
CLIP VESOCCLUDE MED 6/CT (CLIP) ×3 IMPLANT
CLIP VESOCCLUDE SM WIDE 6/CT (CLIP) ×3 IMPLANT
COVER PROBE W GEL 5X96 (DRAPES) ×2 IMPLANT
COVER WAND RF STERILE (DRAPES) ×3 IMPLANT
DERMABOND ADVANCED (GAUZE/BANDAGES/DRESSINGS) ×2
DERMABOND ADVANCED .7 DNX12 (GAUZE/BANDAGES/DRESSINGS) ×1 IMPLANT
ELECT REM PT RETURN 9FT ADLT (ELECTROSURGICAL) ×3
ELECTRODE REM PT RTRN 9FT ADLT (ELECTROSURGICAL) ×1 IMPLANT
GAUZE SPONGE 4X4 12PLY STRL (GAUZE/BANDAGES/DRESSINGS) ×2 IMPLANT
GLOVE BIO SURGEON STRL SZ 6 (GLOVE) ×2 IMPLANT
GLOVE BIO SURGEON STRL SZ7.5 (GLOVE) ×5 IMPLANT
GLOVE BIOGEL PI IND STRL 8 (GLOVE) ×1 IMPLANT
GLOVE BIOGEL PI INDICATOR 8 (GLOVE) ×2
GOWN STRL REUS W/ TWL LRG LVL3 (GOWN DISPOSABLE) ×3 IMPLANT
GOWN STRL REUS W/TWL LRG LVL3 (GOWN DISPOSABLE) ×6
KIT BASIN OR (CUSTOM PROCEDURE TRAY) ×3 IMPLANT
KIT TURNOVER KIT B (KITS) ×3 IMPLANT
NS IRRIG 1000ML POUR BTL (IV SOLUTION) ×3 IMPLANT
PACK CV ACCESS (CUSTOM PROCEDURE TRAY) ×3 IMPLANT
PAD ARMBOARD 7.5X6 YLW CONV (MISCELLANEOUS) ×6 IMPLANT
SPONGE SURGIFOAM ABS GEL 100 (HEMOSTASIS) IMPLANT
SUT ETHILON 3 0 PS 1 (SUTURE) ×6 IMPLANT
SUT PROLENE 5 0 C 1 24 (SUTURE) ×4 IMPLANT
SUT PROLENE 6 0 BV (SUTURE) ×3 IMPLANT
SUT VIC AB 3-0 SH 27 (SUTURE) ×4
SUT VIC AB 3-0 SH 27X BRD (SUTURE) ×1 IMPLANT
SUT VICRYL 4-0 PS2 18IN ABS (SUTURE) ×3 IMPLANT
TOWEL GREEN STERILE (TOWEL DISPOSABLE) ×3 IMPLANT
UNDERPAD 30X30 (UNDERPADS AND DIAPERS) ×3 IMPLANT
WATER STERILE IRR 1000ML POUR (IV SOLUTION) ×3 IMPLANT

## 2018-09-16 NOTE — Interval H&P Note (Signed)
History and Physical Interval Note:  09/16/2018 9:30 AM  Eric [name]  has presented today for surgery, with the diagnosis of fistula pseudoaneurysm.  The various methods of treatment have been discussed with the patient and family. After consideration of risks, benefits and other options for treatment, the patient has consented to  Procedure(s): [place] (Right) as a surgical intervention.  The patient's history has been reviewed, patient examined, no change in status, stable for surgery.  I have reviewed the patient's chart and labs.  Questions were answered to the patient's satisfaction.     Deitra Mayo

## 2018-09-16 NOTE — Progress Notes (Signed)
Pharmacy Antibiotic Note  Eric [name] is a 63 y.o. male admitted on 09/12/2018 with bacteremia.  Pharmacy has been consulted for vancomycin dosing.  Blood cultures with 2/2 coag neg staph.   Plan: Vancomycin 1750 mg IV x 1 now Then give vancomycin 1000 mg IV after each HD.  Height: 5\' 6"  (167.6 cm) Weight: 177 lb 1.6 oz (80.3 kg) IBW/kg (Calculated) : 63.8  Temp (24hrs), Avg:98.1 F (36.7 C), Min:97.5 F (36.4 C), Max:98.6 F (37 C)  Recent Labs  Lab 09/12/18 2154 09/13/18 0701 09/13/18 0901 09/13/18 1156 09/14/18 0237 09/15/18 0414 09/15/18 0851 09/16/18 0406  WBC 3.9*  --  4.8 4.1  --   --  5.0  --   CREATININE 4.70* 5.48*  --  5.74* 4.76* 6.58*  --  6.04*    Estimated Creatinine Clearance: 12.3 mL/min (A) (by C-G formula based on SCr of 6.04 mg/dL (H)).    No Known Allergies  Thank you for allowing pharmacy to be a part of this patient's care.  Marguerite Olea, [facility] Clinical Pharmacist Phone [phone]  09/16/2018 8:52 PM

## 2018-09-16 NOTE — Op Note (Signed)
    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 Scot Dock, MD, FACS  ASSIST: Ellsworth Lennox, RNFA  ANESTHESIA: Local with sedation  EBL: Minimal  INDICATIONS:    [name] is a 63 y.o. male who developed a large ulceration overlying an aneurysm of his right upper arm fistula.  He presents for plication.  FINDINGS:   Palpable thrill in the fistula at the completion of the procedure  TECHNIQUE:   The patient was taken to the operating room and sedated by anesthesia.  The right upper extremity was prepped and draped in usual sterile fashion.  The ulcer was quite wide.  In order to to allow for the wound to be closed the length of the incision was approximately 9 cm and the width 3 cm.  This ellipse of skin overlying the aneurysm was anesthetized and excised.  The aneurysm was dissected free circumferentially and then the dissection continued proximally and distally.  The patient was heparinized.  The the fistula was clamped proximally distally.  The ulcerated area was excised from the aneurysm.  The fistula was then sewn back with running 5-0 Prolene suture.  I then placed some tacking sutures to slightly roll the suture line so that the anterior aspect of the fistula would not be cannulated.  At the completion was a good thrill in the fistula.  Hemostasis was obtained in the wound.  The wound was closed with interrupted 3-0 Vicryl's and then the skin closed with interrupted 3 oh nylons.  A sterile dressing was applied.  The patient tolerated the procedure well was transferred to the recovery room in stable condition.  All needle and sponge counts were correct.  Deitra Mayo, MD, FACS [facility]  DATE OF DICTATION:    09/16/2018

## 2018-09-16 NOTE — Progress Notes (Signed)
PROGRESS NOTE    Eric [name]  WCH:[id number] DOB: 06/07/1955 DOA: 09/12/2018 PCP: System, Pcp Not In   Brief Narrative:  Eric [name] is a 63 y.o. male with medical history significant of ESRD on dialysis TTS, CHF, CAD, HTN, AICD following a prior cardiac arrest, HCV with cirrhosis who presented to the ED with c/o chest pressure, radiates to arms, legs, and back.  Onset around 9pm on the night of 09/12/2018.  This was associated with some shortness of breath but no nausea, vomiting, radiation to the neck and shoulder or any diaphoresis.  Received Tylenol, neurontin as well as hydralazine and imdur (which he apparently takes PRN for BP) for SBP 150.  Pain hasnt really improved.  Does have associated SOB.  ED Course: BNP 2881, CXR suggestive of mild pulmonary edema.EKG shows TWI in inferior and lateral leads which are possibly new.  Troponins however have remained around 38/39 and are stable.  BMP showed creatinine of 4.7.  Electrolytes were stable.  Chest x-ray showed cardiomegaly with pulmonary edema and mild bilateral groundglass opacity.  COVID-19 negative.  Patient was admitted under hospitalist service for further management.  Nephrology was consulted for dialysis.  Due to some bleeding at the AV fistula, vascular surgery was consulted and patient underwent fistula plication on Monday.  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  For T wave inversion in lateral leads.  Troponin  only slightly elevated around 39 but flat.  This is likely demand ischemia and not a sign of ACS.  Continue to monitor on telemetry.  This could also be due to pulmonary edema.  He is going to have dialysis today.  I do not see any indication for cardiology consult at this point in time however if he continues to have chest pain despite of having dialysis then we may consider consulting them.  Acute hypoxic respiratory failure secondary to acute pulmonary edema/acute on chronic combined systolic congestive heart failure: Lasix on hold.  He takes that as needed on off dialysis.  He is having third session of dialysis today.  He is off of oxygen now and does not have any dyspnea.    Essential hypertension: Blood pressure stable and on low normal side and had orthostatic hypotension while working with PT today.  Continue metoprolol twice daily.  Apparently he takes Imdur and hydralazine as needed at home.  Continue PRN hydralazine.  No Imdur.  ESRD on HHS: Dialysis on TTS schedule.  Nephrology consulted.  Dialysis per them.  History of liver cirrhosis/recurrent ascites: Patient has presented to ED twice in last 1 month with almost similar symptoms.  He ended up having diagnostic paracentesis on 08/20/2018 which was unremarkable for any infection.  He returned back to ER on 09/11/2018 and ended up having another therapeutic paracentesis with removal of 3 L.  Underwent diagnostic and therapeutic paracentesis.  Fluid analysis not indicative of any infection.  Chronic thrombocytopenia: Secondary to liver cirrhosis.  Stable.  No signs of bleeding.  Avoid heparin products.  Generalized weakness/orthostatic  hypotension: PT OT was consulted to see patient before discharge however patient was unable to complete any physical therapy/walking due to having orthostatic hypotension causing dizziness.  Patient will stay overnight and will be reassessed by PT tomorrow.  DVT prophylaxis: SCD/avoiding heparin products due to  thrombocytopenia Code Status: Full code Family Communication: None present Disposition Plan: Will be discharged hopefully tomorrow.  Consultants:   Nephrology  Vascular surgery  Procedures:   Paracentesis on 09/14/2018 with 1.8 L of yellow fluid drained.  Antimicrobials:   None   Subjective: Patient seen and examined prior to working with physical therapy.  For the first time, despite of having complained of abdominal pain, he was ready to go home.  No new complaint.  Objective: Vitals:   09/16/18 1240 09/16/18 1251 09/16/18 1311 09/16/18 1437  BP:  95/71 97/66 92/69   Pulse:  72 70 70  Resp:  18 17 (!) 21  Temp: 97.6 F (36.4 C) 98.5 F (36.9 C)    TempSrc:  Oral Oral   SpO2:  98% 93% 95%  Weight:      Height:        Intake/Output Summary (Last 24 hours) at 09/16/2018 1442 Last data filed at 09/16/2018 1300 Gross per 24 hour  Intake 1124 ml  Output 25 ml  Net 1099 ml   Filed Weights   09/15/18 0715 09/15/18 0956 09/16/18 0327  Weight: 81.2 kg 79.3 kg 80.3 kg    Examination:  General exam: Appears calm and comfortable  Respiratory system: Clear to auscultation. Respiratory effort normal. Cardiovascular system: S1 & S2 heard, RRR. No JVD, murmurs, rubs, gallops or clicks. No pedal edema. Gastrointestinal system: Abdomen is nondistended, soft and nontender. No organomegaly or masses felt. Normal bowel sounds heard. Central nervous system: Alert and oriented. No focal neurological deficits. Extremities: Symmetric 5 x 5 power. Skin: No rashes, lesions or ulcers Psychiatry: Judgement and insight appear poor,. Mood & affect flat  Data Reviewed: I have personally reviewed following labs and imaging studies  CBC: Recent Labs  Lab 09/12/18 2154 09/13/18 0901 09/13/18 1156 09/15/18 0851  WBC 3.9* 4.8 4.1 5.0  HGB 9.5* 9.7* 10.2* 9.4*  HCT 31.1* 33.1* 34.2* 31.2*  MCV 103.7* 106.1* 106.5* 103.7*  PLT 68* 69* 63* 56*   Basic Metabolic Panel: Recent Labs   Lab 09/13/18 0701 09/13/18 1156 09/14/18 0237 09/15/18 0414 09/16/18 0406  NA 137 137 135 131* 131*  K 4.2 4.3 4.6 4.7 4.7  CL 94* 95* 95* 93* 95*  CO2 33* 32 31 28 21*  GLUCOSE 71 92 77 89 77  BUN 19 20 16  29* 34*  CREATININE 5.48* 5.74* 4.76* 6.58* 6.04*  CALCIUM 8.1* 8.2* 8.0* 7.9* 7.9*  PHOS 3.1 3.2  --   --   --    GFR: Estimated Creatinine Clearance: 12.3 mL/min (A) (by C-G formula based on SCr of 6.04 mg/dL (H)). Liver Function Tests: Recent Labs  Lab 09/13/18 0701 09/13/18 1156  ALBUMIN 2.9* 3.2*   No results for input(s): LIPASE, AMYLASE in the last 168 hours. No results for input(s): AMMONIA in the last 168 hours. Coagulation Profile: No results for input(s): INR, PROTIME in the last 168 hours. Cardiac Enzymes: No results for input(s): CKTOTAL, CKMB, CKMBINDEX, TROPONINI in the last 168 hours. BNP (last 3 results) No results for input(s): PROBNP in the last 8760 hours. HbA1C: No results for input(s): HGBA1C in the last 72 hours. CBG: Recent Labs  Lab 09/15/18 1111 09/15/18 2210 09/16/18 0744 09/16/18 8315  09/16/18 1312  GLUCAP 80 105* 88 89 78   Lipid Profile: No results for input(s): CHOL, HDL, LDLCALC, TRIG, CHOLHDL, LDLDIRECT in the last 72 hours. Thyroid Function Tests: No results for input(s): TSH, T4TOTAL, FREET4, T3FREE, THYROIDAB in the last 72 hours. Anemia Panel: No results for input(s): VITAMINB12, FOLATE, FERRITIN, TIBC, IRON, RETICCTPCT in the last 72 hours. Sepsis Labs: No results for input(s): PROCALCITON, LATICACIDVEN in the last 168 hours.  Recent Results (from the past 240 hour(s))  SARS CORONAVIRUS 2     Status: None   Collection Time: 09/12/18 11:09 PM  Result Value Ref Range Status   SARS Coronavirus 2 NEGATIVE NEGATIVE Final    Comment: (NOTE) SARS-CoV-2 target nucleic acids are NOT DETECTED. The SARS-CoV-2 RNA is generally detectable in upper and lower respiratory specimens during the acute phase of infection. Negative  results do not preclude SARS-CoV-2 infection, do not rule out co-infections with other pathogens, and should not be used as the sole basis for treatment or other patient management decisions. Negative results must be combined with clinical observations, patient history, and epidemiological information. The expected result is Negative. Fact Sheet for Patients: SugarRoll.be Fact Sheet for Healthcare Providers: https://www.woods-mathews.com/ This test is not yet approved or cleared by the [place] FDA and  has been authorized for detection and/or diagnosis of SARS-CoV-2 by FDA under an Emergency Use Authorization (EUA). This EUA will remain  in effect (meaning this test can be used) for the duration of the COVID-19 declaration under Section 56 4(b)(1) of the Act, 21 U.S.C. section 360bbb-3(b)(1), unless the authorization is terminated or revoked sooner. Performed at [facility], [place] [address], [place], [place] [address]   Culture, blood (routine x 2)     Status: None (Preliminary result)   Collection Time: 09/13/18  5:01 PM   Specimen: BLOOD LEFT HAND  Result Value Ref Range Status   Specimen Description BLOOD LEFT HAND  Final   Special Requests   Final    BOTTLES DRAWN AEROBIC ONLY Blood Culture results may not be optimal due to an inadequate volume of blood received in culture bottles   Culture  Setup Time AEROBIC BOTTLE ONLY GRAM POSITIVE COCCI   Final   Culture   Final    CULTURE REINCUBATED FOR BETTER GROWTH Performed at [facility], [place] [address], [place], [place] [address]    Report Status PENDING  Incomplete  Culture, blood (routine x 2)     Status: Abnormal (Preliminary result)   Collection Time: 09/13/18  5:02 PM   Specimen: BLOOD RIGHT HAND  Result Value Ref Range Status   Specimen Description BLOOD RIGHT HAND  Final   Special Requests   Final    BOTTLES DRAWN AEROBIC ONLY Blood Culture results may not be  optimal due to an inadequate volume of blood received in culture bottles   Culture  Setup Time   Final    GRAM POSITIVE COCCI AEROBIC BOTTLE ONLY CRITICAL RESULT CALLED TO, READ BACK BY AND VERIFIED WITH: PHARMD A MEYER [id number] 1959 MLM    Culture (A)  Final    STAPHYLOCOCCUS SPECIES (COAGULASE NEGATIVE) THE SIGNIFICANCE OF ISOLATING THIS ORGANISM FROM A SINGLE SET OF BLOOD CULTURES WHEN MULTIPLE SETS ARE DRAWN IS UNCERTAIN. PLEASE NOTIFY THE MICROBIOLOGY DEPARTMENT WITHIN ONE WEEK IF SPECIATION AND SENSITIVITIES ARE REQUIRED. Performed at [facility], [place] [address] [place], [place] [address]    Report Status PENDING  Incomplete  Blood Culture ID Panel (Reflexed)  Status: Abnormal   Collection Time: 09/13/18  5:02 PM  Result Value Ref Range Status   Enterococcus species NOT DETECTED NOT DETECTED Final   Listeria monocytogenes NOT DETECTED NOT DETECTED Final   Staphylococcus species DETECTED (A) NOT DETECTED Final    Comment: Methicillin (oxacillin) resistant coagulase negative staphylococcus. Possible blood culture contaminant (unless isolated from more than one blood culture draw or clinical case suggests pathogenicity). No antibiotic treatment is indicated for blood  culture contaminants. CRITICAL RESULT CALLED TO, READ BACK BY AND VERIFIED WITH: PHARMD A MEYER [id number] 1959 MLM    Staphylococcus aureus (BCID) NOT DETECTED NOT DETECTED Final   Methicillin resistance DETECTED (A) NOT DETECTED Final    Comment: CRITICAL RESULT CALLED TO, READ BACK BY AND VERIFIED WITH: PHARMD A MEYER [id number] 1959 MLM    Streptococcus species NOT DETECTED NOT DETECTED Final   Streptococcus agalactiae NOT DETECTED NOT DETECTED Final   Streptococcus pneumoniae NOT DETECTED NOT DETECTED Final   Streptococcus pyogenes NOT DETECTED NOT DETECTED Final   Acinetobacter baumannii NOT DETECTED NOT DETECTED Final   Enterobacteriaceae species NOT DETECTED NOT DETECTED Final   Enterobacter cloacae complex  NOT DETECTED NOT DETECTED Final   Escherichia coli NOT DETECTED NOT DETECTED Final   Klebsiella oxytoca NOT DETECTED NOT DETECTED Final   Klebsiella pneumoniae NOT DETECTED NOT DETECTED Final   Proteus species NOT DETECTED NOT DETECTED Final   Serratia marcescens NOT DETECTED NOT DETECTED Final   Haemophilus influenzae NOT DETECTED NOT DETECTED Final   Neisseria meningitidis NOT DETECTED NOT DETECTED Final   Pseudomonas aeruginosa NOT DETECTED NOT DETECTED Final   Candida albicans NOT DETECTED NOT DETECTED Final   Candida glabrata NOT DETECTED NOT DETECTED Final   Candida krusei NOT DETECTED NOT DETECTED Final   Candida parapsilosis NOT DETECTED NOT DETECTED Final   Candida tropicalis NOT DETECTED NOT DETECTED Final    Comment: Performed at [facility], [address], [place], [place] [address]  Culture, body fluid-bottle     Status: None (Preliminary result)   Collection Time: 09/14/18 12:46 PM   Specimen: Peritoneal Washings  Result Value Ref Range Status   Specimen Description PERITONEAL  Final   Special Requests NONE  Final   Culture   Final    NO GROWTH 2 DAYS Performed at [place] [address], [place], [place] [address]    Report Status PENDING  Incomplete  Gram stain     Status: None   Collection Time: 09/14/18 12:46 PM   Specimen: Peritoneal Washings  Result Value Ref Range Status   Specimen Description PERITONEAL  Final   Special Requests NONE  Final   Gram Stain   Final    MODERATE WBC PRESENT,BOTH PMN AND MONONUCLEAR NO ORGANISMS SEEN Performed at [facility], [address] [place], [place] [address]    Report Status 09/14/2018 FINAL  Final      Radiology Studies: No results found.  Scheduled Meds: . aspirin EC  81 mg Oral Daily  . calcium acetate  667 mg Oral TID WC  . Chlorhexidine Gluconate Cloth  6 each Topical Q0600  . cinacalcet  30 mg Oral Q supper  . doxercalciferol  2 mcg Intravenous Q T,Th,Sa-HD  . feeding  supplement (PRO-STAT SUGAR FREE 64)  30 mL Oral BID  . gabapentin  300 mg Oral QHS  . metoprolol tartrate  25 mg Oral BID  . multivitamin  1 tablet Oral QHS  . pantoprazole  40  mg Oral Daily  . rosuvastatin  20 mg Oral q1800  . sodium chloride flush  3 mL Intravenous Q12H   Continuous Infusions: . sodium chloride    .  ceFAZolin (ANCEF) IV       LOS: 3 days   Time spent: 30 minutes    Darliss Cheney, MD [facility] Pager [phone]  If 7PM-7AM, please contact night-coverage [email or web address] Password [email or web address] 09/16/2018, 2:42 PM

## 2018-09-16 NOTE — Progress Notes (Signed)
PT Cancellation Note  Patient Details Name: Eric [name] MRN: [id number] DOB: [birth year]   Cancelled Treatment:    Reason Eval/Treat Not Completed: Patient at procedure or test/unavailable Pt currently in OR. Will follow up as schedule allows.   Leighton Ruff, PT, DPT  Acute Rehabilitation Services  Pager: [phone] Office: [phone]    Rudean Hitt 09/16/2018, 11:30 AM

## 2018-09-16 NOTE — Plan of Care (Signed)
  Problem: Clinical Measurements: Goal: Respiratory complications will improve Outcome: Progressing Note: No s/s of respiratory complications noted.  Stable on room air. Goal: Cardiovascular complication will be avoided Outcome: Progressing Note: No s/s of cardiovascular complication noted.  V-paced on telemetry.  VSS.

## 2018-09-16 NOTE — Evaluation (Signed)
Physical Therapy Evaluation Patient Details Name: Eric [name] MRN: [id number] DOB: 01-Jul-1955 Today's Date: 09/16/2018   History of Present Illness  Pt is a 63 y/o male admitted secondary to chest pain. Pt with CHF and likely demand ischemia. Pt also with aneurysm wtih ulceration in R fistula and is s/p plication of aneurysm and revision of R fistula. PMH includes DM, hepatic cirrhosis, HTN, ESRD on HD TTS, s/p AICD.   Clinical Impression  Pt admitted secondary to problem above with deficits below. Pt requiring min A for bed mobility and to stand at EOB. Pt with increased dizziness in standing, and increased sway and dizziness upon return to sitting, therefore returned to supine. BP checked and BP at 78/59 mmHg, and then at 85/64 mmHg with rest. Notified RN. Feel pt will progress well once BP controlled, however, will reassess during next session. Will continue to follow acutely to maximize functional mobility independence and safety.     Follow Up Recommendations Home health PT;Supervision/Assistance - 24 hour(pending progress)    Equipment Recommendations  None recommended by PT    Recommendations for Other Services       Precautions / Restrictions Precautions Precautions: Fall;Other (comment) Precaution Comments: Check BP Restrictions Weight Bearing Restrictions: No      Mobility  Bed Mobility Overal bed mobility: Needs Assistance Bed Mobility: Supine to Sit;Sit to Supine     Supine to sit: Min assist Sit to supine: Min assist   General bed mobility comments: Min A for trunk assist and LE assist throughout bed mobility. Required assist with return to supine secondary to increased dizziness. BP checked upon return to supine, as pt unable to tolerate sitting secondary to dizziness. BP at 78/59 mmHg. Pt continued to report dizziness in supine; RN notified. Upon exit, pt's BP at 85/64 mmHg.   Transfers Overall transfer level: Needs assistance Equipment used: Rolling walker  (2 wheeled) Transfers: Sit to/from Stand Sit to Stand: Min assist         General transfer comment: Min A for lift assist and steadying. Pt with increased sway and dizziness in standing, so returned to seated position and then to supine.   Ambulation/Gait                Stairs            Wheelchair Mobility    Modified Rankin (Stroke Patients Only)       Balance Overall balance assessment: Needs assistance Sitting-balance support: No upper extremity supported;Feet supported Sitting balance-Leahy Scale: Fair     Standing balance support: Bilateral upper extremity supported;During functional activity Standing balance-Leahy Scale: Poor Standing balance comment: Reliant on BUE support                              Pertinent Vitals/Pain Pain Assessment: No/denies pain    Home Living Family/patient expects to be discharged to:: Private residence Living Arrangements: Spouse/significant other;Children Available Help at Discharge: Family;Available 24 hours/day Type of Home: House Home Access: Stairs to enter Entrance Stairs-Rails: Psychiatric nurse of Steps: 3 Home Layout: One level Home Equipment: Walker - 2 wheels;Cane - single point      Prior Function Level of Independence: Independent with assistive device(s)         Comments: Was using RW for ambulation      Hand Dominance        Extremity/Trunk Assessment   Upper Extremity Assessment Upper Extremity Assessment: Generalized weakness  Lower Extremity Assessment Lower Extremity Assessment: Generalized weakness    Cervical / Trunk Assessment Cervical / Trunk Assessment: Normal  Communication   Communication: No difficulties  Cognition Arousal/Alertness: Awake/alert Behavior During Therapy: WFL for tasks assessed/performed Overall Cognitive Status: No family/caregiver present to determine baseline cognitive functioning                                  General Comments: Pt with somewhat slowed processing noted. Was slow to answer questions at time.       General Comments      Exercises     Assessment/Plan    PT Assessment Patient needs continued PT services  PT Problem List Cardiopulmonary status limiting activity;Decreased strength;Decreased balance;Decreased mobility;Decreased cognition;Decreased safety awareness;Decreased knowledge of use of DME       PT Treatment Interventions DME instruction;Gait training;Stair training;Functional mobility training;Therapeutic activities;Balance training;Therapeutic exercise;Patient/family education    PT Goals (Current goals can be found in the Care Plan section)  Acute Rehab PT Goals Patient Stated Goal: to go home PT Goal Formulation: With patient Time For Goal Achievement: 09/30/18 Potential to Achieve Goals: Fair    Frequency Min 3X/week   Barriers to discharge        Co-evaluation               AM-PAC PT "6 Clicks" Mobility  Outcome Measure Help needed turning from your back to your side while in a flat bed without using bedrails?: A Little Help needed moving from lying on your back to sitting on the side of a flat bed without using bedrails?: A Little Help needed moving to and from a bed to a chair (including a wheelchair)?: A Lot Help needed standing up from a chair using your arms (e.g., wheelchair or bedside chair)?: A Little Help needed to walk in hospital room?: A Lot Help needed climbing 3-5 steps with a railing? : A Lot 6 Click Score: 15    End of Session Equipment Utilized During Treatment: Gait belt Activity Tolerance: Treatment limited secondary to medical complications (Comment)(orthostatic hypotension ) Patient left: in bed;with bed alarm set;with call bell/phone within reach Nurse Communication: Mobility status;Other (comment)(orthostatic hypotension ) PT Visit Diagnosis: Other abnormalities of gait and mobility (R26.89);Unsteadiness on feet  (R26.81);Muscle weakness (generalized) (M62.81)    Time: 7116-5790 PT Time Calculation (min) (ACUTE ONLY): 20 min   Charges:   PT Evaluation $PT Eval Moderate Complexity: [place], PT, DPT  Acute Rehabilitation Services  Pager: [phone] Office: [phone]   Rudean Hitt 09/16/2018, 3:45 PM

## 2018-09-16 NOTE — Anesthesia Preprocedure Evaluation (Addendum)
Anesthesia Evaluation  Patient identified by MRN, date of birth, ID band Patient awake    Reviewed: Allergy & Precautions, NPO status , Patient's Chart, lab work & pertinent test results, reviewed documented beta blocker date and time   History of Anesthesia Complications Negative for: history of anesthetic complications  Airway Mallampati: II  TM Distance: >3 FB Neck ROM: Full    Dental  (+) Dental Advisory Given   Pulmonary former smoker,    Pulmonary exam normal        Cardiovascular hypertension, Pt. on medications and Pt. on home beta blockers + Past MI, + CABG and +CHF  Normal cardiovascular exam+ Cardiac Defibrillator + Valvular Problems/Murmurs MR    '20 TTE - EF 55-60%. Mildly increased left ventricular wall thickness. There is right ventricular volume overload. The right ventricle has severely reduced systolic function. The cavity was moderately enlarged. Mild to moderate MR. TR is severe.    Neuro/Psych Seizures -, Well Controlled,  negative psych ROS   GI/Hepatic GERD  Medicated and Controlled,(+) Cirrhosis       , Hepatitis -, C  Endo/Other  diabetes, Type 2 Hyponatremia Hypocalcemia Hypochloremia   Renal/GU ESRF and DialysisRenal disease     Musculoskeletal negative musculoskeletal ROS (+)   Abdominal   Peds  Hematology  (+) anemia ,  Thrombocytopenia    Anesthesia Other Findings ED Course: BNP 2881, CXR suggestive of mild pulmonary edema .EKG shows TWI in inferior and lateral leads which are possibly new.  Troponins however have remained around 38/39 and are stable.    Reproductive/Obstetrics                           Anesthesia Physical Anesthesia Plan  ASA: IV  Anesthesia Plan: MAC   Post-op Pain Management:    Induction: Intravenous  PONV Risk Score and Plan: 1 and Propofol infusion and Treatment may vary due to age or medical condition  Airway Management  Planned: Natural Airway and Simple Face Mask  Additional Equipment: None  Intra-op Plan:   Post-operative Plan:   Informed Consent: I have reviewed the patients History and Physical, chart, labs and discussed the procedure including the risks, benefits and alternatives for the proposed anesthesia with the patient or authorized representative who has indicated his/her understanding and acceptance.       Plan Discussed with: CRNA and Anesthesiologist  Anesthesia Plan Comments:        Anesthesia Quick Evaluation

## 2018-09-16 NOTE — Progress Notes (Signed)
[facility] Progress Note   Subjective:  Seen in room, sitting in chair. No CP. C/o dyspnea last night. For AVF revision/plication today.  Objective Vitals:   09/15/18 2012 09/16/18 0327 09/16/18 0800 09/16/18 0841  BP:  111/81  106/65  Pulse:  70 80 73  Resp:  15 14 15   Temp: 98.1 F (36.7 C) 98.3 F (36.8 C)    TempSrc: Oral     SpO2:  97%  99%  Weight:  80.3 kg    Height:       Physical Exam General:Well appearing man, NAD. On room air. Heart:RRR; no murmur Lungs:CTA anteriorly Abdomen:soft, distended. Extremities:No LE edema Dialysis Access:RUE AVF + bruit, chronic ulcerated area noted.   Additional Objective Labs: Basic Metabolic Panel: Recent Labs  Lab 09/13/18 0701 09/13/18 1156 09/14/18 0237 09/15/18 0414 09/16/18 0406  NA 137 137 135 131* 131*  K 4.2 4.3 4.6 4.7 4.7  CL 94* 95* 95* 93* 95*  CO2 33* 32 31 28 21*  GLUCOSE 71 92 77 89 77  BUN 19 20 16  29* 34*  CREATININE 5.48* 5.74* 4.76* 6.58* 6.04*  CALCIUM 8.1* 8.2* 8.0* 7.9* 7.9*  PHOS 3.1 3.2  --   --   --    Liver Function Tests: Recent Labs  Lab 09/13/18 0701 09/13/18 1156  ALBUMIN 2.9* 3.2*   CBC: Recent Labs  Lab 09/12/18 2154 09/13/18 0901 09/13/18 1156 09/15/18 0851  WBC 3.9* 4.8 4.1 5.0  HGB 9.5* 9.7* 10.2* 9.4*  HCT 31.1* 33.1* 34.2* 31.2*  MCV 103.7* 106.1* 106.5* 103.7*  PLT 68* 69* 63* 56*   Studies/Results: US Paracentesis  Result Date: 09/14/2018 INDICATION: Patient with history of end-stage renal disease on dialysis, HCV with cirrhosis, recurrent ascites. Request is made for diagnostic and therapeutic paracentesis. EXAM: ULTRASOUND GUIDED DIAGNOSTIC AND THERAPEUTIC PARACENTESIS MEDICATIONS: 10 mL 1% lidocaine COMPLICATIONS: None immediate. PROCEDURE: Informed written consent was obtained from the patient after a discussion of the risks, benefits and alternatives to treatment. A timeout was performed prior to the initiation of the procedure. Initial  ultrasound scanning demonstrates a small amount of ascites within the right lower abdominal quadrant. The right lower abdomen was prepped and draped in the usual sterile fashion. 1% lidocaine was used for local anesthesia. Following this, a 19 gauge, 7-cm, Yueh catheter was introduced. An ultrasound image was saved for documentation purposes. The paracentesis was performed. The catheter was removed and a dressing was applied. The patient tolerated the procedure well without immediate post procedural complication. FINDINGS: A total of approximately 1.8 liters of yellow fluid was removed. Samples were sent to the laboratory as requested by the clinical team. IMPRESSION: Successful ultrasound-guided diagnostic and therapeutic paracentesis yielding 1.8 liters of peritoneal fluid. Read by: Brynda Greathouse PA-C Electronically Signed   By: Aletta Edouard M.D.   On: 09/14/2018 13:52   Medications: . [MAR Hold] sodium chloride    . sodium chloride 10 mL/hr at 09/16/18 0946  . [MAR Hold]  ceFAZolin (ANCEF) IV     . [MAR Hold] aspirin EC  81 mg Oral Daily  . [MAR Hold] calcium acetate  667 mg Oral TID WC  . [MAR Hold] cinacalcet  30 mg Oral Q supper  . [MAR Hold] doxercalciferol  2 mcg Intravenous Q T,Th,Sa-HD  . [MAR Hold] feeding supplement (PRO-STAT SUGAR FREE 64)  30 mL Oral BID  . [MAR Hold] gabapentin  300 mg Oral QHS  . [MAR Hold] metoprolol tartrate  25 mg Oral BID  . [  MAR Hold] multivitamin  1 tablet Oral QHS  . [MAR Hold] pantoprazole  40 mg Oral Daily  . [MAR Hold] rosuvastatin  20 mg Oral q1800  . [MAR Hold] sodium chloride flush  3 mL Intravenous Q12H    Dialysis Orders: TTS at [facility] 3:30hr, 400/A1.5, EDW 81kg, 2K/2Ca, UFP #4, AVF, no heparin - Hectoral 15mcg IV q HD - Mircera 222mcg IV q 2 weeks (last 8/18)  Assessment/Plan: 1.Dyspnea/Pulm edema:Extra HD 8/21 for volume - improved symptoms. 2. ESRD:Now back to usual TTS schedule. Next HD 8/25 if still  inpatient. 3.HTN/volume:Challenging EDW as tolerated - still dyspneic at night. Will lower EDW on d/c. 4. Anemia:Hgb 9.4 - not due for ESA yet. 5. Secondary hyperparathyroidism:Ca/Phos ok. Continue binders and VDRA. 6. Nutrition:Alb low, continue pro-stat 7. Thrombocytopenia: Chronic - no heparin with HD.  8.  AVF ulceration: VVS following, for AVF revision/plication today (5/00).  Veneta Penton, PA-C 09/16/2018, 10:16 AM  [facility] Pager: [phone]

## 2018-09-16 NOTE — Care Management Important Message (Signed)
Important Message  Patient Details  Name: Eric [name] MRN: [id number] Date of Birth: [birth year]   Medicare Important Message Given:  Yes     Shelda Altes 09/16/2018, 1:17 PM

## 2018-09-16 NOTE — Anesthesia Postprocedure Evaluation (Signed)
Anesthesia Post Note  Patient: Eric [name]  Procedure(s) Performed: REVISION PLICATION OF ARTERIOVENOUS FISTULA RIGHT ARM (Right )     Patient location during evaluation: PACU Anesthesia Type: MAC Level of consciousness: awake and alert Pain management: pain level controlled Vital Signs Assessment: post-procedure vital signs reviewed and stable Respiratory status: spontaneous breathing, nonlabored ventilation and respiratory function stable Cardiovascular status: stable and blood pressure returned to baseline Anesthetic complications: no    Last Vitals:  Vitals:   09/16/18 1251 09/16/18 1311  BP: 95/71 97/66  Pulse: 72 70  Resp: 18 17  Temp: 36.9 C   SpO2: 98% 93%    Last Pain:  Vitals:   09/16/18 1311  TempSrc: Oral  PainSc:                  Audry Pili

## 2018-09-16 NOTE — Transfer of Care (Signed)
Immediate Anesthesia Transfer of Care Note  Patient: Eric [name]  Procedure(s) Performed: REVISION PLICATION OF ARTERIOVENOUS FISTULA RIGHT ARM (Right )  Patient Location: PACU  Anesthesia Type:MAC  Level of Consciousness: sedated  Airway & Oxygen Therapy: Patient Spontanous Breathing and Patient connected to face mask oxygen  Post-op Assessment: Report given to RN and Post -op Vital signs reviewed and stable  Post vital signs: Reviewed and stable  Last Vitals:  Vitals Value Taken Time  BP 102/49 09/16/18 1153  Temp    Pulse 70 09/16/18 1155  Resp 10 09/16/18 1155  SpO2 100 % 09/16/18 1155  Vitals shown include unvalidated device data.  Last Pain:  Vitals:   09/16/18 0841  TempSrc:   PainSc: 0-No pain      Patients Stated Pain Goal: 0 (37/04/88 8916)  Complications: No apparent anesthesia complications

## 2018-09-17 ENCOUNTER — Encounter (HOSPITAL_COMMUNITY): Payer: Self-pay | Admitting: Vascular Surgery

## 2018-09-17 LAB — BASIC METABOLIC PANEL
Anion gap: 12 (ref 5–15)
BUN: 46 mg/dL — ABNORMAL HIGH (ref 8–23)
CO2: 24 mmol/L (ref 22–32)
Calcium: 7.4 mg/dL — ABNORMAL LOW (ref 8.9–10.3)
Chloride: 90 mmol/L — ABNORMAL LOW (ref 98–111)
Creatinine, Ser: 7.14 mg/dL — ABNORMAL HIGH (ref 0.61–1.24)
GFR calc Af Amer: 9 mL/min — ABNORMAL LOW (ref >60)
GFR calc non Af Amer: 7 mL/min — ABNORMAL LOW (ref >60)
Glucose, Bld: 75 mg/dL (ref 70–99)
Potassium: 4.9 mmol/L (ref 3.5–5.1)
Sodium: 126 mmol/L — ABNORMAL LOW (ref 135–145)

## 2018-09-17 LAB — CBC
HCT: 30.9 % — ABNORMAL LOW (ref 39.0–52.0)
Hemoglobin: 9.6 g/dL — ABNORMAL LOW (ref 13.0–17.0)
MCH: 31.9 pg (ref 26.0–34.0)
MCHC: 31.1 g/dL (ref 30.0–36.0)
MCV: 102.7 fL — ABNORMAL HIGH (ref 80.0–100.0)
Platelets: 63 10*3/uL — ABNORMAL LOW (ref 150–400)
RBC: 3.01 MIL/uL — ABNORMAL LOW (ref 4.22–5.81)
RDW: 15.4 % (ref 11.5–15.5)
WBC: 6.5 10*3/uL (ref 4.0–10.5)
nRBC: 0 % (ref 0.0–0.2)

## 2018-09-17 LAB — RENAL FUNCTION PANEL
Albumin: 2.8 g/dL — ABNORMAL LOW (ref 3.5–5.0)
Anion gap: 11 (ref 5–15)
BUN: 24 mg/dL — ABNORMAL HIGH (ref 8–23)
CO2: 25 mmol/L (ref 22–32)
Calcium: 7.5 mg/dL — ABNORMAL LOW (ref 8.9–10.3)
Chloride: 94 mmol/L — ABNORMAL LOW (ref 98–111)
Creatinine, Ser: 4.27 mg/dL — ABNORMAL HIGH (ref 0.61–1.24)
GFR calc Af Amer: 16 mL/min — ABNORMAL LOW (ref >60)
GFR calc non Af Amer: 14 mL/min — ABNORMAL LOW (ref >60)
Glucose, Bld: 95 mg/dL (ref 70–99)
Phosphorus: 2.6 mg/dL (ref 2.5–4.6)
Potassium: 4.4 mmol/L (ref 3.5–5.1)
Sodium: 130 mmol/L — ABNORMAL LOW (ref 135–145)

## 2018-09-17 LAB — CULTURE, BLOOD (ROUTINE X 2)

## 2018-09-17 LAB — GLUCOSE, CAPILLARY
Glucose-Capillary: 74 mg/dL (ref 70–99)
Glucose-Capillary: 80 mg/dL (ref 70–99)
Glucose-Capillary: 82 mg/dL (ref 70–99)

## 2018-09-17 LAB — HEPATITIS B E ANTIGEN: Hep B E Ag: NEGATIVE

## 2018-09-17 LAB — HEPATITIS PANEL, ACUTE
HCV Ab: >11 s/co ratio — ABNORMAL HIGH (ref 0.0–0.9)
Hep A IgM: NEGATIVE
Hep B C IgM: NEGATIVE
Hepatitis B Surface Ag: NEGATIVE

## 2018-09-17 LAB — HEPATITIS B CORE ANTIBODY, TOTAL: Hep B Core Total Ab: NEGATIVE

## 2018-09-17 MED ORDER — VANCOMYCIN HCL IN DEXTROSE 1-5 GM/200ML-% IV SOLN
INTRAVENOUS | Status: AC
  Administered 2018-09-17: 1000 mg via INTRAVENOUS
  Filled 2018-09-17: qty 200

## 2018-09-17 MED ORDER — DOXERCALCIFEROL 4 MCG/2ML IV SOLN
INTRAVENOUS | Status: AC
  Administered 2018-09-17: 2 ug via INTRAVENOUS
  Filled 2018-09-17: qty 2

## 2018-09-17 NOTE — Progress Notes (Signed)
PROGRESS NOTE    Eric [name]  WUJ:[id number] DOB: [birth year] DOA: 09/12/2018 PCP: System, Pcp Not In   Brief Narrative:  Eric [name] is a 63 y.o. male with medical history significant of ESRD on dialysis TTS, CHF, CAD, HTN, AICD following a prior cardiac arrest, HCV with cirrhosis who presented to the ED with c/o chest pressure, radiates to arms, legs, and back.  Onset around 9pm on the night of 09/12/2018.  This was associated with some shortness of breath but no nausea, vomiting, radiation to the neck and shoulder or any diaphoresis.  Received Tylenol, neurontin as well as hydralazine and imdur (which he apparently takes PRN for BP) for SBP 150.  Pain hasnt really improved.  Does have associated SOB.  ED Course: BNP 2881, CXR suggestive of mild pulmonary edema.EKG shows TWI in inferior and lateral leads which are possibly new.  Troponins however have remained around 38/39 and are stable.  BMP showed creatinine of 4.7.  Electrolytes were stable.  Chest x-ray showed cardiomegaly with pulmonary edema and mild bilateral groundglass opacity.  COVID-19 negative.  Patient was admitted under hospitalist service for further management.  Nephrology was consulted for dialysis.  Due to some bleeding at the AV fistula, vascular surgery was consulted and patient underwent fistula plication on Monday.  Patient then had positive blood culture reported on 09/16/2018 with staph epidermidis.  This was drawn on 09/13/2018.  Patient was started on vancomycin last night.  Repeating blood culture today.  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  For T wave inversion in lateral leads.  Troponin only slightly elevated around 39 but flat.  This is likely demand ischemia and not a sign of ACS.  Continue to monitor on telemetry.  Patient continues to complain of everywhere, chest, abdomen and legs and head.  Acute hypoxic respiratory failure secondary to acute pulmonary edema/acute on chronic combined systolic congestive heart failure: Lasix on hold.  He takes that as needed on off dialysis.  He has been on room air for last 2 days.  This is resolved.  Essential hypertension: Blood pressure stable and on low normal side and had orthostatic hypotension while working with PT today.  Continue metoprolol twice daily.  Apparently he takes Imdur and hydralazine as needed at home.  Continue PRN hydralazine.  No Imdur.  ESRD on HHS: Dialysis on TTS schedule.  Nephrology consulted.  Dialysis per them.  History of liver cirrhosis/recurrent ascites: Patient has presented to ED twice in last 1 month with almost similar symptoms.  He ended up having diagnostic paracentesis on 08/20/2018 which was unremarkable for any infection.  He returned back to ER on 09/11/2018 and ended up having another therapeutic paracentesis with removal of 3 L.  Underwent diagnostic and therapeutic paracentesis here on 09/14/2018 with removal of 1.8 L of yellow fluid.  Fluid analysis not indicative of any infection.  Chronic thrombocytopenia: Secondary to liver cirrhosis.  Stable.  No signs of bleeding.  Avoid heparin products.  Generalized weakness/orthostatic hypotension: PT OT  was consulted to see patient before discharge on 09/16/2018 however patient was unable to complete any physical therapy/walking due to having orthostatic hypotension causing dizziness.  Patient was kept overnight.  I requested PT to see him early this morning so we can  decide if he is going to require SNF versus home with home health however he was not seen despite of the request and currently patient is in hemodialysis.  Gram-positive bacteremia: 2 of the 2 blood cultures growing staph epidermidis which resulted last night and patient was started on vancomycin, pharmacy to consult and per pharmacy recommendations, he will need 14 days of antibiotics.  Repeating blood cultures today.   DVT prophylaxis: SCD/avoiding heparin products due to thrombocytopenia Code Status: Full code Family Communication: None present Disposition Plan: To be determined based on PT evaluation.  Consultants:   Nephrology  Vascular surgery  Procedures:   Paracentesis on 09/14/2018 with 1.8 L of yellow fluid drained.  Antimicrobials:   Vancomycin started 09/16/2018   Subjective: Patient seen and examined and as usual, he continues to complain of headache, chest pain, abdominal pain and leg pains.  No new complaint.  Remains alert and oriented.  Objective: Vitals:   09/17/18 1116 09/17/18 1133 09/17/18 1200 09/17/18 1230  BP: (P) 104/67 (P) 95/61 (!) (P) 85/55 (!) (P) 86/51  Pulse: (P) 70 (P) 70 (P) 70 (P) 70  Resp: (P) 16 (P) 16 (P) 13   Temp: (P) 98.8 F (37.1 C)     TempSrc: (P) Oral     SpO2: (P) 96%     Weight: (P) 84.1 kg     Height:        Intake/Output Summary (Last 24 hours) at 09/17/2018 1337 Last data filed at 09/17/2018 0600 Gross per 24 hour  Intake 609.16 ml  Output --  Net 609.16 ml   Filed Weights   09/16/18 0327 09/17/18 0500 09/17/18 1116  Weight: 80.3 kg 83.2 kg (P) 84.1 kg    Examination:  General exam: Appears calm and comfortable  Respiratory system: Clear to auscultation. Respiratory effort normal. Cardiovascular system: S1 & S2 heard, RRR. No JVD, murmurs, rubs, gallops or clicks. No pedal edema. Gastrointestinal system: Abdomen is moderately distended with positive fluid shift and nontender and soft. No organomegaly or masses  felt. Normal bowel sounds heard. Central nervous system: Alert and oriented. No focal neurological deficits. Extremities: Symmetric 5 x 5 power. Skin: No rashes, lesions or ulcers Psychiatry: Judgement and insight appear poor, mood & affect flat  Data Reviewed: I have personally reviewed following labs and imaging studies  CBC: Recent Labs  Lab 09/12/18 2154 09/13/18 0901 09/13/18 1156 09/15/18 0851  WBC 3.9* 4.8 4.1 5.0  HGB 9.5* 9.7* 10.2* 9.4*  HCT 31.1* 33.1* 34.2* 31.2*  MCV 103.7* 106.1* 106.5* 103.7*  PLT 68* 69* 63* 56*   Basic Metabolic Panel: Recent Labs  Lab 09/13/18 0701 09/13/18 1156 09/14/18 0237 09/15/18 0414 09/16/18 0406 09/17/18 0324  NA 137 137 135 131* 131* 126*  K 4.2 4.3 4.6 4.7 4.7 4.9  CL 94* 95* 95* 93* 95* 90*  CO2 33* 32 31 28 21* 24  GLUCOSE 71 92 77 89 77 75  BUN 19 20 16  29* 34* 46*  CREATININE 5.48* 5.74* 4.76* 6.58* 6.04* 7.14*  CALCIUM 8.1* 8.2* 8.0* 7.9* 7.9* 7.4*  PHOS 3.1 3.2  --   --   --   --    GFR: Estimated Creatinine Clearance: 10.6 mL/min (A) (by C-G formula based on  SCr of 7.14 mg/dL (H)). Liver Function Tests: Recent Labs  Lab 09/13/18 0701 09/13/18 1156  ALBUMIN 2.9* 3.2*   No results for input(s): LIPASE, AMYLASE in the last 168 hours. No results for input(s): AMMONIA in the last 168 hours. Coagulation Profile: No results for input(s): INR, PROTIME in the last 168 hours. Cardiac Enzymes: No results for input(s): CKTOTAL, CKMB, CKMBINDEX, TROPONINI in the last 168 hours. BNP (last 3 results) No results for input(s): PROBNP in the last 8760 hours. HbA1C: No results for input(s): HGBA1C in the last 72 hours. CBG: Recent Labs  Lab 09/16/18 0858 09/16/18 1312 09/16/18 1625 09/16/18 2210 09/17/18 0748  GLUCAP 89 78 89 101* 74   Lipid Profile: No results for input(s): CHOL, HDL, LDLCALC, TRIG, CHOLHDL, LDLDIRECT in the last 72 hours. Thyroid Function Tests: No results for input(s): TSH, T4TOTAL, FREET4,  T3FREE, THYROIDAB in the last 72 hours. Anemia Panel: No results for input(s): VITAMINB12, FOLATE, FERRITIN, TIBC, IRON, RETICCTPCT in the last 72 hours. Sepsis Labs: No results for input(s): PROCALCITON, LATICACIDVEN in the last 168 hours.  Recent Results (from the past 240 hour(s))  SARS CORONAVIRUS 2     Status: None   Collection Time: 09/12/18 11:09 PM  Result Value Ref Range Status   SARS Coronavirus 2 NEGATIVE NEGATIVE Final    Comment: (NOTE) SARS-CoV-2 target nucleic acids are NOT DETECTED. The SARS-CoV-2 RNA is generally detectable in upper and lower respiratory specimens during the acute phase of infection. Negative results do not preclude SARS-CoV-2 infection, do not rule out co-infections with other pathogens, and should not be used as the sole basis for treatment or other patient management decisions. Negative results must be combined with clinical observations, patient history, and epidemiological information. The expected result is Negative. Fact Sheet for Patients: SugarRoll.be Fact Sheet for Healthcare Providers: https://www.woods-mathews.com/ This test is not yet approved or cleared by the [place] FDA and  has been authorized for detection and/or diagnosis of SARS-CoV-2 by FDA under an Emergency Use Authorization (EUA). This EUA will remain  in effect (meaning this test can be used) for the duration of the COVID-19 declaration under Section 56 4(b)(1) of the Act, 21 U.S.C. section 360bbb-3(b)(1), unless the authorization is terminated or revoked sooner. Performed at [facility], [place] [address] [place], [place] [address]   Culture, blood (routine x 2)     Status: Abnormal   Collection Time: 09/13/18  5:01 PM   Specimen: BLOOD LEFT HAND  Result Value Ref Range Status   Specimen Description BLOOD LEFT HAND  Final   Special Requests   Final    BOTTLES DRAWN AEROBIC ONLY Blood Culture results may not be  optimal due to an inadequate volume of blood received in culture bottles   Culture  Setup Time   Final    AEROBIC BOTTLE ONLY GRAM POSITIVE COCCI CRITICAL VALUE NOTED.  VALUE IS CONSISTENT WITH PREVIOUSLY REPORTED AND CALLED VALUE. Performed at [facility], [place] [address] [place], [place] [address]    Culture STAPHYLOCOCCUS EPIDERMIDIS (A)  Final   Report Status 09/17/2018 FINAL  Final   Organism ID, Bacteria STAPHYLOCOCCUS EPIDERMIDIS  Final      Susceptibility   Staphylococcus epidermidis - MIC*    CIPROFLOXACIN <=0.5 SENSITIVE Sensitive     ERYTHROMYCIN <=0.25 SENSITIVE Sensitive     GENTAMICIN <=0.5 SENSITIVE Sensitive     OXACILLIN <=0.25 SENSITIVE Sensitive     TETRACYCLINE 2 SENSITIVE Sensitive     VANCOMYCIN 2  SENSITIVE Sensitive     TRIMETH/SULFA <=10 SENSITIVE Sensitive     CLINDAMYCIN <=0.25 SENSITIVE Sensitive     RIFAMPIN <=0.5 SENSITIVE Sensitive     Inducible Clindamycin NEGATIVE Sensitive     * STAPHYLOCOCCUS EPIDERMIDIS  Culture, blood (routine x 2)     Status: Abnormal   Collection Time: 09/13/18  5:02 PM   Specimen: BLOOD RIGHT HAND  Result Value Ref Range Status   Specimen Description BLOOD RIGHT HAND  Final   Special Requests   Final    BOTTLES DRAWN AEROBIC ONLY Blood Culture results may not be optimal due to an inadequate volume of blood received in culture bottles   Culture  Setup Time   Final    GRAM POSITIVE COCCI AEROBIC BOTTLE ONLY CRITICAL RESULT CALLED TO, READ BACK BY AND VERIFIED WITH: PHARMD A MEYER [id number] 1957 MLM    Culture (A)  Final    STAPHYLOCOCCUS EPIDERMIDIS SUSCEPTIBILITIES PERFORMED ON PREVIOUS CULTURE WITHIN THE LAST 5 DAYS. Performed at [facility], [place] [address] [place], [place] [address]    Report Status 09/17/2018 FINAL  Final  Blood Culture ID Panel (Reflexed)     Status: Abnormal   Collection Time: 09/13/18  5:02 PM  Result Value Ref Range Status   Enterococcus species NOT DETECTED NOT DETECTED Final    Listeria monocytogenes NOT DETECTED NOT DETECTED Final   Staphylococcus species DETECTED (A) NOT DETECTED Final    Comment: Methicillin (oxacillin) resistant coagulase negative staphylococcus. Possible blood culture contaminant (unless isolated from more than one blood culture draw or clinical case suggests pathogenicity). No antibiotic treatment is indicated for blood  culture contaminants. CRITICAL RESULT CALLED TO, READ BACK BY AND VERIFIED WITH: PHARMD A MEYER [id number] 1959 MLM    Staphylococcus aureus (BCID) NOT DETECTED NOT DETECTED Final   Methicillin resistance DETECTED (A) NOT DETECTED Final    Comment: CRITICAL RESULT CALLED TO, READ BACK BY AND VERIFIED WITH: PHARMD A MEYER [id number] 1959 MLM    Streptococcus species NOT DETECTED NOT DETECTED Final   Streptococcus agalactiae NOT DETECTED NOT DETECTED Final   Streptococcus pneumoniae NOT DETECTED NOT DETECTED Final   Streptococcus pyogenes NOT DETECTED NOT DETECTED Final   Acinetobacter baumannii NOT DETECTED NOT DETECTED Final   Enterobacteriaceae species NOT DETECTED NOT DETECTED Final   Enterobacter cloacae complex NOT DETECTED NOT DETECTED Final   Escherichia coli NOT DETECTED NOT DETECTED Final   Klebsiella oxytoca NOT DETECTED NOT DETECTED Final   Klebsiella pneumoniae NOT DETECTED NOT DETECTED Final   Proteus species NOT DETECTED NOT DETECTED Final   Serratia marcescens NOT DETECTED NOT DETECTED Final   Haemophilus influenzae NOT DETECTED NOT DETECTED Final   Neisseria meningitidis NOT DETECTED NOT DETECTED Final   Pseudomonas aeruginosa NOT DETECTED NOT DETECTED Final   Candida albicans NOT DETECTED NOT DETECTED Final   Candida glabrata NOT DETECTED NOT DETECTED Final   Candida krusei NOT DETECTED NOT DETECTED Final   Candida parapsilosis NOT DETECTED NOT DETECTED Final   Candida tropicalis NOT DETECTED NOT DETECTED Final    Comment: Performed at [facility], [address], [place], [place] [address]  Culture,  body fluid-bottle     Status: None (Preliminary result)   Collection Time: 09/14/18 12:46 PM   Specimen: Peritoneal Washings  Result Value Ref Range Status   Specimen Description PERITONEAL  Final   Special Requests NONE  Final   Culture   Final    NO GROWTH 3 DAYS Performed at [facility], [place] [address], [place], [place] [address]    Report Status PENDING  Incomplete  Gram stain     Status: None   Collection Time: 09/14/18 12:46 PM   Specimen: Peritoneal Washings  Result Value Ref Range Status   Specimen Description PERITONEAL  Final   Special Requests NONE  Final   Gram Stain   Final    MODERATE WBC PRESENT,BOTH PMN AND MONONUCLEAR NO ORGANISMS SEEN Performed at [facility], [address], [place], [place] [address]    Report Status 09/14/2018 FINAL  Final      Radiology Studies: No results found.  Scheduled Meds:  aspirin EC  81 mg Oral Daily   calcium acetate  667 mg Oral TID WC   Chlorhexidine Gluconate Cloth  6 each Topical Q0600   cinacalcet  30 mg Oral Q supper   doxercalciferol  2 mcg Intravenous Q T,Th,Sa-HD   feeding supplement (PRO-STAT SUGAR FREE 64)  30 mL Oral BID   gabapentin  300 mg Oral QHS   metoprolol tartrate  25 mg Oral BID   multivitamin  1 tablet Oral QHS   pantoprazole  40 mg Oral Daily   rosuvastatin  20 mg Oral q1800   sodium chloride flush  3 mL Intravenous Q12H   Continuous Infusions:  sodium chloride     sodium chloride Stopped (09/17/18 0300)   vancomycin       LOS: 4 days   Time spent: 32 minutes    Darliss Cheney, MD [facility] Pager [phone]  If 7PM-7AM, please contact night-coverage [email or web address] Password [email or web address] 09/17/2018, 1:37 PM

## 2018-09-17 NOTE — Progress Notes (Addendum)
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  He will return to the office in approximately 2 weeks for suture removal.  Vascular surgery will be available as needed.  Deitra Mayo, MD, FACS Beeper [phone] Office: [phone]   Subjective  - Minimal pain at incision left UE.   Objective 138/88 72 99 F (37.2 C) (Oral) 17 100%  Intake/Output Summary (Last 24 hours) at 09/17/2018 0848 Last data filed at 09/17/2018 0600 Gross per 24 hour  Intake 1253.16 ml  Output 25 ml  Net 1228.16 ml    Incision healing well without hematoma or drainage Right AV fistula working with excellent doppler flow Hand sensation and motor intact, doppler radial signal.  Assessment/Planning: POD # 1 Plication of aneurysm of right upper arm fistula  Nylon sutures intact F/U in 2-3 weeks for incision check and removal of sutures Disposition stable  Roxy Horseman 09/17/2018 8:48 AM --  Laboratory Lab Results: Recent Labs    09/15/18 0851  WBC 5.0  HGB 9.4*  HCT 31.2*  PLT 56*   BMET Recent Labs    09/16/18 0406 09/17/18 0324  NA 131* 126*  K 4.7 4.9  CL 95* 90*  CO2 21* 24  GLUCOSE 77 75  BUN 34* 46*  CREATININE 6.04* 7.14*  CALCIUM 7.9* 7.4*    COAG Lab Results  Component Value Date   INR 1.7 (H) 08/03/2018   INR 1.33 01/03/2018   INR 1.58 (H) 06/14/2015   No results found for: PTT

## 2018-09-17 NOTE — Discharge Instructions (Signed)
° °  [facility]  Discharge Instructions  AV Fistula or Graft Surgery for Dialysis Access  Please refer to the following instructions for your post-procedure care. Your surgeon or physician assistant will discuss any changes with you.  Activity  You may drive the day following your surgery, if you are comfortable and no longer taking prescription pain medication. Resume full activity as the soreness in your incision resolves.  Bathing/Showering  You may shower after you go home. Keep your incision dry for 48 hours. Do not soak in a bathtub, hot tub, or swim until the incision heals completely. You may not shower if you have a hemodialysis catheter.  Incision Care  Clean your incision with mild soap and water after 48 hours. Pat the area dry with a clean towel. You do not need a bandage unless otherwise instructed. Do not apply any ointments or creams to your incision. You may have skin glue on your incision. Do not peel it off. It will come off on its own in about one week. Your arm may swell a bit after surgery. To reduce swelling use pillows to elevate your arm so it is above your heart. Your doctor will tell you if you need to lightly wrap your arm with an ACE bandage.  Diet  Resume your normal diet. There are not special food restrictions following this procedure. In order to heal from your surgery, it is CRITICAL to get adequate nutrition. Your body requires vitamins, minerals, and protein. Vegetables are the best source of vitamins and minerals. Vegetables also provide the perfect balance of protein. Processed food has little nutritional value, so try to avoid this.  Medications  Resume taking all of your medications. If your incision is causing pain, you may take over-the counter pain relievers such as acetaminophen (Tylenol). If you were prescribed a stronger pain medication, please be aware these medications can cause nausea and constipation. Prevent  nausea by taking the medication with a snack or meal. Avoid constipation by drinking plenty of fluids and eating foods with high amount of fiber, such as fruits, vegetables, and grains.  Do not take Tylenol if you are taking prescription pain medications.  Follow up Your surgeon may want to see you in the office following your access surgery. If so, this will be arranged at the time of your surgery.  Please call us immediately for any of the following conditions:  Increased pain, redness, drainage (pus) from your incision site Fever of 101 degrees or higher Severe or worsening pain at your incision site Hand pain or numbness.  Reduce your risk of vascular disease:  Stop smoking. If you would like help, call [facility] at 1-800-QUIT-NOW 6576068018) or [facility] at [place] your cholesterol Maintain a desired weight Control your diabetes Keep your blood pressure down  Dialysis  It will take several weeks to several months for your new dialysis access to be ready for use. Your surgeon will determine when it is okay to use it. Your nephrologist will continue to direct your dialysis. You can continue to use your Permcath until your new access is ready for use.   09/17/2018 Eric [name] [id number] 27-Sep-1955  Surgeon(s): Angelia Mould, MD  Procedure(s): REVISION PLICATION OF ARTERIOVENOUS FISTULA RIGHT ARM   If you have any questions, please call the office at [phone].

## 2018-09-17 NOTE — Progress Notes (Signed)
Patient's spouse called Jene Every) and left her number to be called back tonight for f/up.  Spoke to Mrs. Branden and gave her an update on her husband's activities today and treatments.  Mrs. Silverthorn asked if patient would go home tomorrow, stated that the physician will f/up tomorrow and they will make the decision.

## 2018-09-17 NOTE — Progress Notes (Signed)
Eric [name] Progress Note   Subjective:  Seen in room. Looks comfortable. No CP/dyspnea. Underwent AVF plication yesterday - plenty of room to stick below. Looks like his Blood Cx flipped positive for Methicillin Staph Epi. Now on Vanc.  Objective Vitals:   09/17/18 0453 09/17/18 0500 09/17/18 0506 09/17/18 0910  BP:   138/88 107/76  Pulse: 70  72 85  Resp: 14  17 12   Temp:   99 F (37.2 C)   TempSrc:   Oral   SpO2: 94%  100% 95%  Weight:  83.2 kg    Height:       Physical Exam General:Well appearing man, NAD. On room air. Heart:RRR; no murmur Lungs:CTA anteriorly Abdomen:soft, distended. Extremities:No LE edema Dialysis Access:RUE AVF + bruit, horizontal incision with sutures in place.  Additional Objective Labs: Basic Metabolic Panel: Recent Labs  Lab 09/13/18 0701 09/13/18 1156  09/15/18 0414 09/16/18 0406 09/17/18 0324  NA 137 137   < > 131* 131* 126*  K 4.2 4.3   < > 4.7 4.7 4.9  CL 94* 95*   < > 93* 95* 90*  CO2 33* 32   < > 28 21* 24  GLUCOSE 71 92   < > 89 77 75  BUN 19 20   < > 29* 34* 46*  CREATININE 5.48* 5.74*   < > 6.58* 6.04* 7.14*  CALCIUM 8.1* 8.2*   < > 7.9* 7.9* 7.4*  PHOS 3.1 3.2  --   --   --   --    < > = values in this interval not displayed.   Liver Function Tests: Recent Labs  Lab 09/13/18 0701 09/13/18 1156  ALBUMIN 2.9* 3.2*   CBC: Recent Labs  Lab 09/12/18 2154 09/13/18 0901 09/13/18 1156 09/15/18 0851  WBC 3.9* 4.8 4.1 5.0  HGB 9.5* 9.7* 10.2* 9.4*  HCT 31.1* 33.1* 34.2* 31.2*  MCV 103.7* 106.1* 106.5* 103.7*  PLT 68* 69* 63* 56*   CBG: Recent Labs  Lab 09/16/18 0858 09/16/18 1312 09/16/18 1625 09/16/18 2210 09/17/18 0748  GLUCAP 89 78 89 101* 74   Medications: . sodium chloride    . sodium chloride Stopped (09/17/18 0300)  . vancomycin     . aspirin EC  81 mg Oral Daily  . calcium acetate  667 mg Oral TID WC  . Chlorhexidine Gluconate Cloth  6 each Topical Q0600  . cinacalcet  30  mg Oral Q supper  . doxercalciferol  2 mcg Intravenous Q T,Th,Sa-HD  . feeding supplement (PRO-STAT SUGAR FREE 64)  30 mL Oral BID  . gabapentin  300 mg Oral QHS  . metoprolol tartrate  25 mg Oral BID  . multivitamin  1 tablet Oral QHS  . pantoprazole  40 mg Oral Daily  . rosuvastatin  20 mg Oral q1800  . sodium chloride flush  3 mL Intravenous Q12H    Dialysis Orders: TTS at [facility] 3:30hr, 400/A1.5, EDW 81kg, 2K/2Ca, UFP #4, AVF, no heparin - Hectoral 33mcg IV q HD - Mircera 2110mcg IV q 2 weeks (last 8/18)  Assessment/Plan: 1.Dyspnea/Pulm edema:Extra HD 8/21 for volume - improved symptoms. 2. ESRD:Now back to usual TTS schedule. HD later today. No heparin. 3.HTN/volume:Challenging EDW as tolerated - still dyspneic at night. Will lower EDW on d/c. 4. Anemia:Hgb9.4- not due for ESA yet. 5. Secondary hyperparathyroidism:Ca/Phos ok. Continue binders and VDRA. 6. Nutrition:Alb low,continuepro-stat 7. Thrombocytopenia: Chronic - no heparin with HD. 8.  AVF ulceration: VVS following, s/p AVF revision/plication 8/08.  9.  ** MRSE bacteremia: BCx 8/21 just turned positive, started on Vanc last night. Will need at least 2 week course. Would repeat Blood Cx.  Veneta Penton, PA-C 09/17/2018, 9:59 AM  [employer] Pager: [phone]

## 2018-09-17 NOTE — Progress Notes (Signed)
Physical Therapy Treatment Patient Details Name: Eric [name] MRN: [id number] DOB: [birth year] Today's Date: 09/17/2018    History of Present Illness Pt is a 63 y/o male admitted secondary to chest pain. Pt with CHF and likely demand ischemia. Pt also with aneurysm wtih ulceration in R fistula and is s/p plication of aneurysm and revision of R fistula. PMH includes DM, hepatic cirrhosis, HTN, ESRD on HD TTS, s/p AICD.     PT Comments    Pt eager to get out of bed and walk with therapy, unfortunately pt continues to be limited in safe mobility by orthostatic hypotension (see below). Pt is min A for bed mobility and minA for transfers. With drop in BP in standing pt becomes dizzy, closes his eyes and sways as well as becomes less responsive to questions. Pt adamant at 2 attempts at standing because "I really want to walk.". HHPT pending resolution of hypotension. PT will continue to follow.   Orthostatic BPs  Supine 102/70  Sitting 104/49  Sitting after 3 min 105/89  Unable to maintain Standing due to dizziness BP in sitting  79/54  Sitting 3 min 95/63  2nd attempt at standing  74/64  Standing 3 minutes  70/53  After 3 minutes in supine 82/60       Follow Up Recommendations  Home health PT;Supervision/Assistance - 24 hour(pending progress)     Equipment Recommendations  None recommended by PT       Precautions / Restrictions Precautions Precautions: Fall;Other (comment) Precaution Comments: Check BP Restrictions Weight Bearing Restrictions: No    Mobility  Bed Mobility Overal bed mobility: Needs Assistance Bed Mobility: Supine to Sit;Sit to Supine     Supine to sit: Min assist Sit to supine: Min guard   General bed mobility comments: minA for pt to pull up against therapist, pt reports dizziness in sitting, min guard for safety as pt returns to bed  Transfers Overall transfer level: Needs assistance Equipment used: Rolling walker (2 wheeled) Transfers: Sit  to/from Stand Sit to Stand: Min assist         General transfer comment: min A for power up and steadying vc for hand placement, 2 bouts of standing both with dizziness, closed eyes and swaying, despite dizziness with first attempt pt adamant about trying again. with similar results  Ambulation/Gait             General Gait Details: unsafe due to orthostatic hypotension         Balance Overall balance assessment: Needs assistance Sitting-balance support: No upper extremity supported;Feet supported Sitting balance-Leahy Scale: Fair     Standing balance support: Bilateral upper extremity supported;During functional activity Standing balance-Leahy Scale: Poor Standing balance comment: Reliant on BUE support                             Cognition Arousal/Alertness: Awake/alert Behavior During Therapy: WFL for tasks assessed/performed Overall Cognitive Status: No family/caregiver present to determine baseline cognitive functioning                                 General Comments: continues to have slow processing and response to questions         General Comments General comments (skin integrity, edema, etc.): continues to be orthostatic, SaO2 on RA >90%O2, and HR 79-90 bpm      Pertinent Vitals/Pain Pain Assessment: 0-10 Pain Score: 8  Pain Location: abdomen Pain Descriptors / Indicators: Aching;Squeezing Pain Intervention(s): Limited activity within patient's tolerance;Monitored during session;Repositioned           PT Goals (current goals can now be found in the care plan section) Acute Rehab PT Goals Patient Stated Goal: to go home PT Goal Formulation: With patient Time For Goal Achievement: 09/30/18 Potential to Achieve Goals: Fair Progress towards PT goals: Not progressing toward goals - comment    Frequency    Min 3X/week      PT Plan Current plan remains appropriate       AM-PAC PT "6 Clicks" Mobility   Outcome  Measure  Help needed turning from your back to your side while in a flat bed without using bedrails?: A Little Help needed moving from lying on your back to sitting on the side of a flat bed without using bedrails?: A Little Help needed moving to and from a bed to a chair (including a wheelchair)?: A Lot Help needed standing up from a chair using your arms (e.g., wheelchair or bedside chair)?: A Little Help needed to walk in hospital room?: A Lot Help needed climbing 3-5 steps with a railing? : A Lot 6 Click Score: 15    End of Session Equipment Utilized During Treatment: Gait belt Activity Tolerance: Treatment limited secondary to medical complications (Comment)(orthostatic hypotension ) Patient left: in bed;with bed alarm set;with call bell/phone within reach Nurse Communication: Mobility status;Other (comment)(orthostatic hypotension ) PT Visit Diagnosis: Other abnormalities of gait and mobility (R26.89);Unsteadiness on feet (R26.81);Muscle weakness (generalized) (M62.81)     Time: 1711-1740 PT Time Calculation (min) (ACUTE ONLY): 29 min  Charges:  $Gait Training: 23-37 mins                     Raffaela Ladley B. Migdalia Dk PT, DPT Acute Rehabilitation Services Pager [phone] Office [phone]    [place] 09/17/2018, 6:10 PM

## 2018-09-18 LAB — BASIC METABOLIC PANEL
Anion gap: 12 (ref 5–15)
BUN: 31 mg/dL — ABNORMAL HIGH (ref 8–23)
CO2: 24 mmol/L (ref 22–32)
Calcium: 7.3 mg/dL — ABNORMAL LOW (ref 8.9–10.3)
Chloride: 95 mmol/L — ABNORMAL LOW (ref 98–111)
Creatinine, Ser: 5.58 mg/dL — ABNORMAL HIGH (ref 0.61–1.24)
GFR calc Af Amer: 11 mL/min — ABNORMAL LOW (ref >60)
GFR calc non Af Amer: 10 mL/min — ABNORMAL LOW (ref >60)
Glucose, Bld: 85 mg/dL (ref 70–99)
Potassium: 4.5 mmol/L (ref 3.5–5.1)
Sodium: 131 mmol/L — ABNORMAL LOW (ref 135–145)

## 2018-09-18 LAB — GLUCOSE, CAPILLARY
Glucose-Capillary: 85 mg/dL (ref 70–99)
Glucose-Capillary: 87 mg/dL (ref 70–99)

## 2018-09-18 MED ORDER — PNEUMOCOCCAL VAC POLYVALENT 25 MCG/0.5ML IJ INJ: 0.5000 mL | INJECTION | INTRAMUSCULAR | Status: DC

## 2018-09-18 NOTE — Progress Notes (Signed)
PROGRESS NOTE  Eric [name] CHE:[id number] DOB: [birth year] DOA: 09/12/2018 PCP: System, Pcp Not In  Brief History   Eric [name] a 63 y.o.malewith medical history significant ofESRD on dialysis TTS, CHF, CAD, HTN, AICD following a prior cardiac arrest, HCV with cirrhosis who presented to the ED with c/o chest pressure, radiates to arms, legs, and back. Onset around 9pm on the night of 09/12/2018.  This was associated with some shortness of breath but no nausea, vomiting, radiation to the neck and shoulder or any diaphoresis.  Received Tylenol, neurontin as well as hydralazine and imdur (which he apparently takes PRN for BP) for SBP 150. Pain hasnt really improved. Does have associated SOB.  ED Course:BNP 2881, CXR suggestive of mild pulmonary edema.EKG shows TWI in inferior and lateral leads which are possibly new.  Troponins however have remained around 38/39 and are stable.  BMP showed creatinine of 4.7.  Electrolytes were stable.  Chest x-ray showed cardiomegaly with pulmonary edema and mild bilateral groundglass opacity.  COVID-19 negative.  Patient was admitted under hospitalist service for further management.  Nephrology was consulted for dialysis.  Due to some bleeding at the AV fistula, vascular surgery was consulted and patient underwent fistula plication on Monday.  Patient then had positive blood culture reported on 09/16/2018 with staph epidermidis.  This was drawn on 09/13/2018.  Patient was started on vancomycin last night. Blood cultures repeated on 09/17/2018. The patient will require a 2 week course of vancomycin.  Consultants  . Vascular surgery . Nephrology  Procedures  . Plication of aneurysm of the right upper arm fistula with ulcer . Dialysis  Antibiotics   Anti-infectives (From admission, onward)   Start     Dose/Rate Route Frequency Ordered Stop   09/17/18 1200  vancomycin (VANCOCIN) IVPB 1000 mg/200 mL premix     1,000 mg 200 mL/hr over 60 Minutes  Intravenous Every T-Th-Sa (Hemodialysis) 09/16/18 2047     09/16/18 2100  vancomycin (VANCOCIN) 1,750 mg in sodium chloride 0.9 % 500 mL IVPB     1,750 mg 250 mL/hr over 120 Minutes Intravenous  Once 09/16/18 2047 09/17/18 0200   09/16/18 1030  ceFAZolin (ANCEF) IVPB 2g/100 mL premix     2 g 200 mL/hr over 30 Minutes Intravenous On call to O.R. 09/15/18 5361 09/17/18 0559    .  Subjective  The patient is somnolent and difficult to awaken. Once awake the patient is complaining of pain all over.   Objective   Vitals:  Vitals:   09/18/18 1152 09/18/18 1629  BP: 114/72 96/78  Pulse: 82 70  Resp:  18  Temp:    SpO2: 93% 97%    Exam:  Constitutional:  . Patient is somnolent and somewhat difficult to awaken. He falls right back to sleep after complaining of pain all over.  Respiratory:  . CTA bilaterally, no w/r/r.  . Respiratory effort normal. No retractions or accessory muscle use Cardiovascular:  . RRR, no m/r/g . No LE extremity edema   . Normal pedal pulses Abdomen:  . Abdomen appears normal; no tenderness or masses . No hernias . No HSM Musculoskeletal:  . No cyanosis, clubbing or edema Skin:  . No rashes, lesions, ulcers . palpation of skin: no induration or nodules Neurologic:  . CN 2-12 intact . Sensation all 4 extremities intact Psychiatric:  . Mental status o Mood, affect appropriate o Orientation to person, place, time  . judgment and insight appear intact   I have personally reviewed the following:  Today's Data  . Vitals, BMP  Micro Data  . Blood cultures x 2 positive for staph epidermidis.  Scheduled Meds: . aspirin EC  81 mg Oral Daily  . calcium acetate  667 mg Oral TID WC  . Chlorhexidine Gluconate Cloth  6 each Topical Q0600  . cinacalcet  30 mg Oral Q supper  . doxercalciferol  2 mcg Intravenous Q T,Th,Sa-HD  . feeding supplement (PRO-STAT SUGAR FREE 64)  30 mL Oral BID  . gabapentin  300 mg Oral QHS  . metoprolol tartrate  25 mg Oral  BID  . multivitamin  1 tablet Oral QHS  . pantoprazole  40 mg Oral Daily  . [START ON 09/19/2018] pneumococcal 23 valent vaccine  0.5 mL Intramuscular Tomorrow-1000  . rosuvastatin  20 mg Oral q1800  . sodium chloride flush  3 mL Intravenous Q12H   Continuous Infusions: . sodium chloride    . sodium chloride Stopped (09/17/18 0300)  . 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  For T wave inversion in lateral leads.  Troponin only slightly elevated around 39 but flat.  This is likely demand ischemia and not a sign of ACS.  Continue to monitor on telemetry.  Patient continues to complain of everywhere, chest, abdomen and legs and head.  Acute hypoxic respiratory failure secondary to acute pulmonary edema/acute on chronic combined systolic congestive heart failure: Lasix on hold.  He takes that as needed on off dialysis.  He has been on room air for last 2 days.  This is resolved.  Essential hypertension: Blood pressure stable and on low normal side and had orthostatic hypotension while working with PT today.  Continue metoprolol twice daily.  Apparently he takes Imdur and hydralazine as needed at home.  Continue PRN hydralazine.  No Imdur.  ESRD on HHS: Dialysis on TTS schedule.  Nephrology consulted.  Dialysis per them.  History of liver cirrhosis/recurrent ascites: Patient has presented to ED twice in last 1 month with almost similar  symptoms.  He ended up having diagnostic paracentesis on 08/20/2018 which was unremarkable for any infection.  He returned back to ER on 09/11/2018 and ended up having another therapeutic paracentesis with removal of 3 L.  Underwent diagnostic and therapeutic paracentesis here on 09/14/2018 with removal of 1.8 L of yellow fluid.  Fluid analysis not indicative of any infection.  Chronic thrombocytopenia: Secondary to liver cirrhosis.  Stable.  No signs of bleeding.  Avoid heparin products.  Generalized weakness/orthostatic hypotension: PT OT was consulted to see patient before discharge on 09/16/2018 however patient was unable to complete any physical therapy/walking due to having orthostatic hypotension causing dizziness.  Patient was kept overnight.  I requested PT to see him early this morning so we can decide if he is going to require SNF versus home with home health however he was not seen despite of the request and currently patient is in hemodialysis.  Gram-positive bacteremia: 2 of the  2 blood cultures growing staph epidermidis which resulted last night and patient was started on vancomycin, pharmacy to consult and per pharmacy recommendations, he will need 14 days of antibiotics.  Repeating blood cultures on 09/17/2018.  I have seen and examined this patient myself. I have spent 32 minutes in his evaluation and care.  DVT prophylaxis: SCD/avoiding heparin products due to thrombocytopenia Code Status: Full code Family Communication: None present Disposition Plan: To be determined based on PT evaluation.  Oliviah Agostini, DO [facility] Direct contact: see [email or web address]  7PM-7AM contact night coverage as above 09/18/2018, 7:46 PM  LOS: 5 days

## 2018-09-18 NOTE — Progress Notes (Signed)
[facility] Progress Note   Subjective:  Seen in room. Tells me "everything hurts" today. BP low this morning. No CP or dyspnea currently, did have a little SOB overnight - laying flat in bed currently.  Objective Vitals:   09/18/18 0034 09/18/18 0504 09/18/18 0730 09/18/18 0840  BP: 105/71 109/68 (!) 79/65 (!) 88/62  Pulse: 69 85 70 69  Resp: 10 16 18 15   Temp:  98.4 F (36.9 C) 98.1 F (36.7 C)   TempSrc:  Oral Oral   SpO2: 99% 100% 91% 93%  Weight:  81 kg    Height:       Physical Exam General:Well appearing man, NAD. Onroom air. Heart:RRR; no murmur Lungs:CTA anteriorly Abdomen:Distended, slightly tense. Extremities:No LE edema Dialysis Access:RUE AVF + bruit, horizontal incision with sutures in place.  Additional Objective Labs: Basic Metabolic Panel: Recent Labs  Lab 09/13/18 0701 09/13/18 1156  09/17/18 0324 09/17/18 1625 09/18/18 0305  NA 137 137   < > 126* 130* 131*  K 4.2 4.3   < > 4.9 4.4 4.5  CL 94* 95*   < > 90* 94* 95*  CO2 33* 32   < > 24 25 24   GLUCOSE 71 92   < > 75 95 85  BUN 19 20   < > 46* 24* 31*  CREATININE 5.48* 5.74*   < > 7.14* 4.27* 5.58*  CALCIUM 8.1* 8.2*   < > 7.4* 7.5* 7.3*  PHOS 3.1 3.2  --   --  2.6  --    < > = values in this interval not displayed.   Liver Function Tests: Recent Labs  Lab 09/13/18 0701 09/13/18 1156 09/17/18 1625  ALBUMIN 2.9* 3.2* 2.8*   CBC: Recent Labs  Lab 09/12/18 2154 09/13/18 0901 09/13/18 1156 09/15/18 0851 09/17/18 1400  WBC 3.9* 4.8 4.1 5.0 6.5  HGB 9.5* 9.7* 10.2* 9.4* 9.6*  HCT 31.1* 33.1* 34.2* 31.2* 30.9*  MCV 103.7* 106.1* 106.5* 103.7* 102.7*  PLT 68* 69* 63* 56* 63*   CBG: Recent Labs  Lab 09/17/18 0748 09/17/18 1631 09/17/18 2212 09/18/18 0649 09/18/18 0753  GLUCAP 74 80 82 85 87   Medications: . sodium chloride    . sodium chloride Stopped (09/17/18 0300)  . vancomycin 1,000 mg (09/17/18 1525)   . aspirin EC  81 mg Oral Daily  . calcium  acetate  667 mg Oral TID WC  . Chlorhexidine Gluconate Cloth  6 each Topical Q0600  . cinacalcet  30 mg Oral Q supper  . doxercalciferol  2 mcg Intravenous Q T,Th,Sa-HD  . feeding supplement (PRO-STAT SUGAR FREE 64)  30 mL Oral BID  . gabapentin  300 mg Oral QHS  . metoprolol tartrate  25 mg Oral BID  . multivitamin  1 tablet Oral QHS  . pantoprazole  40 mg Oral Daily  . [START ON 09/19/2018] pneumococcal 23 valent vaccine  0.5 mL Intramuscular Tomorrow-1000  . rosuvastatin  20 mg Oral q1800  . sodium chloride flush  3 mL Intravenous Q12H    Dialysis Orders: TTS at [facility] 3:30hr, 400/A1.5, EDW 81kg, 2K/2Ca, UFP #4, AVF, no heparin - Hectoral 74mcg IV q HD - Mircera 29mcg IV q 2 weeks (last 8/18)  Assessment/Plan: 1.Dyspnea/Pulm edema:Extra HD 8/21 for volume - improved symptoms. 2. ESRD:Now back to usual TTS schedule. Next HD 8/27. 3.HTN/volume:Challenging EDW as tolerated- still dyspneic at night. Will lower EDW on d/c. BP low today - metoprolol held. ?d/t pain meds. Follow. 4. Anemia:Hgb9.4- not  due for ESA yet. 5. Secondary hyperparathyroidism:Ca/Phos ok. Continue binders and VDRA. 6. Nutrition:Alb low,continuepro-stat 7. Thrombocytopenia: Chronic - no heparin with HD. 8.AVF ulceration: VVS following, s/p AVF revision/plication8/24. 9.  MRSE bacteremia: BCx 8/21 just turned positive, started on Vanc. Will need 2 week course.  Veneta Penton, PA-C 09/18/2018, 10:11 AM  [facility] Pager: [phone]

## 2018-09-19 LAB — BASIC METABOLIC PANEL
Anion gap: 13 (ref 5–15)
BUN: 51 mg/dL — ABNORMAL HIGH (ref 8–23)
CO2: 25 mmol/L (ref 22–32)
Calcium: 7.7 mg/dL — ABNORMAL LOW (ref 8.9–10.3)
Chloride: 92 mmol/L — ABNORMAL LOW (ref 98–111)
Creatinine, Ser: 7.26 mg/dL — ABNORMAL HIGH (ref 0.61–1.24)
GFR calc Af Amer: 8 mL/min — ABNORMAL LOW (ref >60)
GFR calc non Af Amer: 7 mL/min — ABNORMAL LOW (ref >60)
Glucose, Bld: 69 mg/dL — ABNORMAL LOW (ref 70–99)
Potassium: 5 mmol/L (ref 3.5–5.1)
Sodium: 130 mmol/L — ABNORMAL LOW (ref 135–145)

## 2018-09-19 LAB — CULTURE, BODY FLUID W GRAM STAIN -BOTTLE: Culture: NO GROWTH

## 2018-09-19 MED ORDER — DIPHENHYDRAMINE HCL 25 MG PO CAPS
25.0000 mg | ORAL_CAPSULE | Freq: Four times a day (QID) | ORAL | Status: DC | PRN
  Administered 2018-09-19: 25 mg via ORAL
  Filled 2018-09-19: qty 1

## 2018-09-19 MED ORDER — VANCOMYCIN HCL IN DEXTROSE 1-5 GM/200ML-% IV SOLN
INTRAVENOUS | Status: AC
  Administered 2018-09-19: 1000 mg via INTRAVENOUS
  Filled 2018-09-19: qty 200

## 2018-09-19 MED ORDER — DOXERCALCIFEROL 4 MCG/2ML IV SOLN
INTRAVENOUS | Status: AC
  Administered 2018-09-19: 2 ug via INTRAVENOUS
  Filled 2018-09-19: qty 2

## 2018-09-19 NOTE — Progress Notes (Signed)
Physical Therapy Treatment Patient Details Name: Eric [name] MRN: [id number] DOB: 12-Aug-1955 Today's Date: 09/19/2018    History of Present Illness Pt is a 63 y/o male admitted secondary to chest pain. Pt with CHF and likely demand ischemia. 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 PMH includes DM, hepatic cirrhosis, HTN, ESRD on HD TTS, s/p AICD.    PT Comments    Pt sitting in straight back chair on arrival with BP 79/64 (70) with HR 70. Pt reports no dizziness and sitting up for several hours despite apparent drowsiness throughout session. Upon standing BP 84/65 (73), HR 70 with pt remaining asymptomatic and requesting gait. Attempted short distance gait but pt remains unable to recognize his activity limitations and required direction to limit gait to only 15' with return to EOB at which time BP was 65/41 (48) with pt immediately returned to supine with BP 78/58 (67) after 2 min in supine. RN aware of all above and pt educated for current status limiting him to stand pivot transfers for safety to prevent syncope. Pt unable to demonstrate comprehension of current medical status as he continues to state he is ready to go home.   SpO2 90-95% on RA    Follow Up Recommendations  Home health PT;Supervision/Assistance - 24 hour     Equipment Recommendations  Wheelchair (measurements PT)    Recommendations for Other Services       Precautions / Restrictions Precautions Precautions: Fall;Other (comment) Precaution Comments: Check BP    Mobility  Bed Mobility Overal bed mobility: Needs Assistance Bed Mobility: Sit to Supine       Sit to supine: Min assist   General bed mobility comments: min assist to bring legs onto surface with increased time to position and may be related to hypotension at time  Transfers Overall transfer level: Needs assistance   Transfers: Sit to/from Stand Sit to Stand: Min guard         General transfer comment: pt in  straight back chair on arrival with cues for hand placement to stand with use of bil armrests and increased time. Pt reports no dizziness in standing with MAP slightly elevated  Ambulation/Gait Ambulation/Gait assistance: Min assist Gait Distance (Feet): 15 Feet Assistive device: Rolling walker (2 wheeled) Gait Pattern/deviations: Step-through pattern;Decreased stride length;Trunk flexed   Gait velocity interpretation: 1.31 - 2.62 ft/sec, indicative of limited community ambulator General Gait Details: cues for posture and limiting gait with assist to direct RW   Stairs             Wheelchair Mobility    Modified Rankin (Stroke Patients Only)       Balance Overall balance assessment: Needs assistance Sitting-balance support: No upper extremity supported;Feet supported Sitting balance-Leahy Scale: Fair     Standing balance support: Bilateral upper extremity supported;During functional activity Standing balance-Leahy Scale: Poor Standing balance comment: Reliant on BUE support                             Cognition Arousal/Alertness: Awake/alert Behavior During Therapy: Flat affect Overall Cognitive Status: Impaired/Different from baseline Area of Impairment: Orientation;Attention;Memory;Following commands;Safety/judgement;Awareness;Problem solving                 Orientation Level: Disoriented to;Time;Situation Current Attention Level: Focused Memory: Decreased short-term memory Following Commands: Follows one step commands inconsistently Safety/Judgement: Decreased awareness of safety;Decreased awareness of deficits Awareness: Intellectual Problem Solving: Slow processing;Requires verbal cues General Comments: pt  appears drowsy throughout session with pt stating he is always that way. Pt stating "monday" and "june" for orientation and despite immediate need to lie down due to hypotension pt unable to process he is not ready for D/C      Exercises       General Comments        Pertinent Vitals/Pain Pain Score: 7  Pain Location: generalized Pain Descriptors / Indicators: Aching;Guarding Pain Intervention(s): Limited activity within patient's tolerance;Monitored during session;Premedicated before session;Repositioned    Home Living                      Prior Function            PT Goals (current goals can now be found in the care plan section) Progress towards PT goals: Progressing toward goals    Frequency    Min 3X/week      PT Plan Current plan remains appropriate    Co-evaluation              AM-PAC PT "6 Clicks" Mobility   Outcome Measure  Help needed turning from your back to your side while in a flat bed without using bedrails?: A Little Help needed moving from lying on your back to sitting on the side of a flat bed without using bedrails?: A Little Help needed moving to and from a bed to a chair (including a wheelchair)?: A Little Help needed standing up from a chair using your arms (e.g., wheelchair or bedside chair)?: A Little Help needed to walk in hospital room?: A Lot Help needed climbing 3-5 steps with a railing? : Total 6 Click Score: 15    End of Session Equipment Utilized During Treatment: Gait belt Activity Tolerance: Treatment limited secondary to medical complications (Comment) Patient left: in bed;with bed alarm set;with call bell/phone within reach Nurse Communication: Mobility status(vital signs, cognition and function) PT Visit Diagnosis: Other abnormalities of gait and mobility (R26.89);Unsteadiness on feet (R26.81);Muscle weakness (generalized) (M62.81)     Time: 7034-0352 PT Time Calculation (min) (ACUTE ONLY): 17 min  Charges:  $Therapeutic Activity: 8-22 mins                     Shakaya Bhullar Pam Drown, PT Acute Rehabilitation Services Pager: [phone] Office: [place] 09/19/2018, 1:39 PM

## 2018-09-19 NOTE — Progress Notes (Signed)
Pharmacy Antibiotic Note  Eric [name] is a 63 y.o. male admitted on 09/12/2018 with bacteremia.  Pharmacy has been consulted for vancomycin dosing.  Blood cultures with 2/2 coag neg staph.   Plan: Continue vancomycin 1000 mg IV after each HD.  Height: 5\' 6"  (167.6 cm) Weight: 178 lb 9.2 oz (81 kg) IBW/kg (Calculated) : 63.8  Temp (24hrs), Avg:98.3 F (36.8 C), Min:97.8 F (36.6 C), Max:99 F (37.2 C)  Recent Labs  Lab 09/12/18 2154  09/13/18 0901 09/13/18 1156  09/15/18 0851 09/16/18 0406 09/17/18 0324 09/17/18 1400 09/17/18 1625 09/18/18 0305 09/19/18 0311  WBC 3.9*  --  4.8 4.1  --  5.0  --   --  6.5  --   --   --   CREATININE 4.70*   < >  --  5.74*   < >  --  6.04* 7.14*  --  4.27* 5.58* 7.26*   < > = values in this interval not displayed.    Estimated Creatinine Clearance: 10.3 mL/min (A) (by C-G formula based on SCr of 7.26 mg/dL (H)).    No Known Allergies  Aubryanna Nesheim A. Levada Dy, PharmD, BCPS, FNKF Clinical Pharmacist [place] Please utilize Amion for appropriate phone number to reach the unit pharmacist ([place])    09/19/2018 4:02 PM

## 2018-09-19 NOTE — Progress Notes (Signed)
[facility] Progress Note   Subjective:  Seen in room. Says "still feeling bad." No CP, c/o dyspnea again last night. BP remains low sided today.  Objective Vitals:   09/18/18 0840 09/18/18 1152 09/18/18 1629 09/18/18 2149  BP: (!) 88/62 114/72 96/78 101/68  Pulse: 69 82 70 70  Resp: 15  18 (!) 23  Temp:    98.1 F (36.7 C)  TempSrc:    Oral  SpO2: 93% 93% 97% 91%  Weight:      Height:       Physical Exam General:Well appearing man, NAD. Onroom air. Heart:RRR; no murmur Lungs:CTA anteriorly Abdomen:Distended, slightly tense. Extremities:No LE edema Dialysis Access:RUE AVF + bruit,horizontal incision with sutures in place.  Additional Objective Labs: Basic Metabolic Panel: Recent Labs  Lab 09/13/18 0701 09/13/18 1156  09/17/18 1625 09/18/18 0305 09/19/18 0311  NA 137 137   < > 130* 131* 130*  K 4.2 4.3   < > 4.4 4.5 5.0  CL 94* 95*   < > 94* 95* 92*  CO2 33* 32   < > 25 24 25   GLUCOSE 71 92   < > 95 85 69*  BUN 19 20   < > 24* 31* 51*  CREATININE 5.48* 5.74*   < > 4.27* 5.58* 7.26*  CALCIUM 8.1* 8.2*   < > 7.5* 7.3* 7.7*  PHOS 3.1 3.2  --  2.6  --   --    < > = values in this interval not displayed.   Liver Function Tests: Recent Labs  Lab 09/13/18 0701 09/13/18 1156 09/17/18 1625  ALBUMIN 2.9* 3.2* 2.8*   CBC: Recent Labs  Lab 09/12/18 2154 09/13/18 0901 09/13/18 1156 09/15/18 0851 09/17/18 1400  WBC 3.9* 4.8 4.1 5.0 6.5  HGB 9.5* 9.7* 10.2* 9.4* 9.6*  HCT 31.1* 33.1* 34.2* 31.2* 30.9*  MCV 103.7* 106.1* 106.5* 103.7* 102.7*  PLT 68* 69* 63* 56* 63*   Blood Culture    Component Value Date/Time   SDES BLOOD LEFT HAND 09/17/2018 1631   SPECREQUEST  09/17/2018 1631    AEROBIC BOTTLE ONLY Blood Culture results may not be optimal due to an inadequate volume of blood received in culture bottles   CULT  09/17/2018 1631    NO GROWTH < 24 HOURS Performed at [facility], [place] [address], [place], [place] [address]    REPTSTATUS PENDING 09/17/2018 1631   Medications: . sodium chloride    . sodium chloride Stopped (09/17/18 0300)  . vancomycin 1,000 mg (09/17/18 1525)   . aspirin EC  81 mg Oral Daily  . calcium acetate  667 mg Oral TID WC  . Chlorhexidine Gluconate Cloth  6 each Topical Q0600  . cinacalcet  30 mg Oral Q supper  . doxercalciferol  2 mcg Intravenous Q T,Th,Sa-HD  . feeding supplement (PRO-STAT SUGAR FREE 64)  30 mL Oral BID  . gabapentin  300 mg Oral QHS  . metoprolol tartrate  25 mg Oral BID  . multivitamin  1 tablet Oral QHS  . pantoprazole  40 mg Oral Daily  . pneumococcal 23 valent vaccine  0.5 mL Intramuscular Tomorrow-1000  . rosuvastatin  20 mg Oral q1800  . sodium chloride flush  3 mL Intravenous Q12H    Dialysis Orders: TTS at [facility] 3:30hr, 400/A1.5, EDW 81kg, 2K/2Ca, UFP #4, AVF, no heparin - Hectoral 34mcg IV q HD - Mircera 285mcg IV q 2 weeks (last 8/18)  Assessment/Plan: 1.Dyspnea/Pulm edema:Extra HD 8/21 for volume -  improved symptoms. 2. ESRD:Now back to usual TTS schedule.Next HD 8/27. 3.HTN/volume:Challenging EDW as tolerated- still dyspneic at night. Will lower EDW on d/c. BP remains soft - metoprolol held. ?d/t pain meds. Follow. 4. Anemia:Hgb9.6- not due for ESA yet. 5. Secondary hyperparathyroidism:Corr Ca and Phos low sided. Continue binders, sensipar, and VDRA for now. 6. Nutrition:Alb low,continuepro-stat 7. Thrombocytopenia: Chronic - no heparin with HD. 8.AVF ulceration: VVS following,s/pAVF revision/plication8/24. 9. MRSE bacteremia: BCx 8/21 positive, started on Vanc. Will need 2 week course. Repeat BCx 8/25 negative so far.   Veneta Penton, PA-C 09/19/2018, 9:03 AM  [facility] Pager: [phone]

## 2018-09-19 NOTE — Progress Notes (Signed)
PROGRESS NOTE  Eric [name] GYK:[id number] DOB: 1955-02-17 DOA: 09/12/2018 PCP: System, Pcp Not In  Brief History   Eric [name] a 63 y.o.malewith medical history significant ofESRD on dialysis TTS, CHF, CAD, HTN, AICD following a prior cardiac arrest, HCV with cirrhosis who presented to the ED with c/o chest pressure, radiates to arms, legs, and back. Onset around 9pm on the night of 09/12/2018.  This was associated with some shortness of breath but no nausea, vomiting, radiation to the neck and shoulder or any diaphoresis.  Received Tylenol, neurontin as well as hydralazine and imdur (which he apparently takes PRN for BP) for SBP 150. Pain hasnt really improved. Does have associated SOB.  ED Course:BNP 2881, CXR suggestive of mild pulmonary edema.EKG shows TWI in inferior and lateral leads which are possibly new.  Troponins however have remained around 38/39 and are stable.  BMP showed creatinine of 4.7.  Electrolytes were stable.  Chest x-ray showed cardiomegaly with pulmonary edema and mild bilateral groundglass opacity.  COVID-19 negative.  Patient was admitted under hospitalist service for further management.  Nephrology was consulted for dialysis.  Due to some bleeding at the AV fistula, vascular surgery was consulted and patient underwent fistula plication on Monday.  Patient then had positive blood culture reported on 09/16/2018 with staph epidermidis.  This was drawn on 09/13/2018.  Patient was started on vancomycin last night. Blood cultures repeated on 09/17/2018. The patient will require a 2 week course of vancomycin.  The patient underwent HD this morning and was orthostatic this afternoon.  Consultants  . Vascular surgery . Nephrology  Procedures  . 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 . Dialysis  Antibiotics   Anti-infectives (From admission, onward)   Start     Dose/Rate Route Frequency Ordered Stop   09/17/18 1200  vancomycin  (VANCOCIN) IVPB 1000 mg/200 mL premix     1,000 mg 200 mL/hr over 60 Minutes Intravenous Every T-Th-Sa (Hemodialysis) 09/16/18 2047     09/16/18 2100  vancomycin (VANCOCIN) 1,750 mg in sodium chloride 0.9 % 500 mL IVPB     1,750 mg 250 mL/hr over 120 Minutes Intravenous  Once 09/16/18 2047 09/17/18 0200   09/16/18 1030  ceFAZolin (ANCEF) IVPB 2g/100 mL premix     2 g 200 mL/hr over 30 Minutes Intravenous On call to O.R. 09/15/18 7939 09/17/18 0559     Subjective  The patient is awake, alert, and oriented x 3.   Objective   Vitals:  Vitals:   09/19/18 1500 09/19/18 1530  BP: 131/64 120/82  Pulse: 72 72  Resp: 19 11  Temp:  99 F (37.2 C)  SpO2:  94%    Exam:  Constitutional:  Patient is awake and alert. No acute distress. Respiratory:  . No increased work of breathing. . No wheezes, rales, or rhonchi . No tactile fremitus. Cardiovascular:  . Regular rate and rhythm . No murmurs, ectopy, or gallups . No lateral PMI. No thrills. Abdomen:  . Abdomen is soft, non-tender, non-distended. . No hernias, masses, or organomegaly . Normoactive bowel sounds. Musculoskeletal:  . No cyanosis, clubbing or edema Skin:  . No rashes, lesions, ulcers . palpation of skin: no induration or nodules Neurologic:  . CN 2-12 intact . Sensation all 4 extremities intact Psychiatric:  . Mental status o Mood, affect appropriate o Orientation to person, place, time  . judgment and insight appear intact   I have personally reviewed the following:   Today's Data  .  Vitals, BMP  Micro Data  . Blood cultures x 2 positive for staph epidermidis.  Scheduled Meds: . aspirin EC  81 mg Oral Daily  . calcium acetate  667 mg Oral TID WC  . Chlorhexidine Gluconate Cloth  6 each Topical Q0600  . cinacalcet  30 mg Oral Q supper  . doxercalciferol  2 mcg Intravenous Q T,Th,Sa-HD  . feeding supplement (PRO-STAT SUGAR FREE 64)  30 mL Oral BID  . gabapentin  300 mg Oral QHS  . metoprolol  tartrate  25 mg Oral BID  . multivitamin  1 tablet Oral QHS  . pantoprazole  40 mg Oral Daily  . pneumococcal 23 valent vaccine  0.5 mL Intramuscular Tomorrow-1000  . rosuvastatin  20 mg Oral q1800  . sodium chloride flush  3 mL Intravenous Q12H   Continuous Infusions: . sodium chloride    . sodium chloride Stopped (09/17/18 0300)  . 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  For T wave inversion in lateral leads.  Troponin only slightly elevated around 39 but flat.  This is likely demand ischemia and not a sign of ACS.  Continue to monitor on telemetry.  Orthostatic hypotension: Hold antihypertensives. Plan to discharge to home when this is resolved.  Acute hypoxic respiratory failure secondary to acute pulmonary edema/acute on chronic combined systolic congestive heart failure: Lasix on hold.  He takes that as needed on off dialysis.  He has been on room air for last 2 days.  This is resolved.  Essential hypertension: Blood pressure stable and on low normal side and had orthostatic hypotension while working with PT today.  Continue metoprolol twice daily.  Apparently he takes Imdur and hydralazine as needed at home.  Continue PRN hydralazine.  No Imdur.  ESRD on HHS: Dialysis on TTS schedule.  Nephrology consulted.  Dialysis per them.  History of liver cirrhosis/recurrent ascites: Patient has presented to ED twice in last 1 month with  almost similar symptoms.  He ended up having diagnostic paracentesis on 08/20/2018 which was unremarkable for any infection.  He returned back to ER on 09/11/2018 and ended up having another therapeutic paracentesis with removal of 3 L.  Underwent diagnostic and therapeutic paracentesis here on 09/14/2018 with removal of 1.8 L of yellow fluid.  Fluid analysis not indicative of any infection.  Chronic thrombocytopenia: Secondary to liver cirrhosis.  Stable.  No signs of bleeding.  Avoid heparin products.  Generalized weakness/orthostatic hypotension: PT OT was consulted to see patient before discharge on 09/16/2018 however patient was unable to complete any physical therapy/walking due to having orthostatic hypotension causing dizziness.  Patient was kept overnight.  I requested PT to see him early this morning so we can decide if he is going to require SNF versus home with home health however he was not seen despite of the request and currently patient is in hemodialysis.  Gram-positive bacteremia: 2 of the 2 blood cultures growing staph epidermidis  which resulted last night and patient was started on vancomycin, pharmacy to consult and per pharmacy recommendations, he will need 14 days of antibiotics.  Repeating blood cultures on 09/17/2018.  I have seen and examined this patient myself. I have spent 35 minutes in his evaluation and care.  DVT prophylaxis: SCD/avoiding heparin products due to thrombocytopenia Code Status: Full code Family Communication: None present Disposition Plan: To be determined based on PT evaluation.  Jerae Izard, DO [facility] Direct contact: see [email or web address]  7PM-7AM contact night coverage as above 09/19/2018, 4:24 PM  LOS: 5 days

## 2018-09-19 NOTE — Plan of Care (Signed)
  Problem: Clinical Measurements: Goal: Cardiovascular complication will be avoided Outcome: Progressing Note: No s/s of cardiovascular complication noted.  V-paced on telemetry.

## 2018-09-20 NOTE — TOC Initial Note (Signed)
Transition of Care [facility]) - Initial/Assessment Note    Patient Details  Name: Eric [name] MRN: [id number] Date of Birth: Jul 28, 1955  Transition [facility]) CM/SW Contact:    Bethena Roys, RN Phone Number: 09/20/2018, 4:12 PM  Clinical Narrative: Pt presented for CHF- PTA from home with family support. Per patient he lives with family. He gave me verbal permission to call daughter- received voicemail. Patient does not have PCP listed. Patient asked me to call daughter for that information. Daughter has not called. Patient declined [place]- stating he has DME WC in the home. Patient has had several ED visits and will benefit from PCP appointment at transition home. Pt could also benefit from [place]- maybe he will reconsider before transition home. CM will continue to monitor.                   Expected Discharge Plan: Home/Self Care Barriers to Discharge: No Barriers Identified   Patient Goals and CMS Choice Patient states their goals for this hospitalization and ongoing recovery are:: "to return home with damily support" CMS Medicare.gov Compare Post Acute Care list provided to:: Patient Choice offered to / list presented to : Patient  Expected Discharge Plan and Services Expected Discharge Plan: Home/Self Care In-house Referral: NA Discharge Planning Services: CM Consult   Living arrangements for the past 2 months: Single Family Home Prior Living Arrangements/Services Living arrangements for the past 2 months: Single Family Home Lives with:: Relatives Patient language and need for interpreter reviewed:: Yes Do you feel safe going back to the place where you live?: Yes      Need for Family Participation in Patient Care: Yes (Comment)   Current home services: DME(Pt states he has a wheelchair at home) Criminal Activity/Legal Involvement Pertinent to Current Situation/Hospitalization: No - Comment as needed  Activities of Daily Living Home Assistive  Devices/Equipment: None ADL Screening (condition at time of admission) Patient's cognitive ability adequate to safely complete daily activities?: Yes Is the patient deaf or have difficulty hearing?: No Does the patient have difficulty seeing, even when wearing glasses/contacts?: No Does the patient have difficulty concentrating, remembering, or making decisions?: No Patient able to express need for assistance with ADLs?: Yes Does the patient have difficulty dressing or bathing?: Yes Independently performs ADLs?: No Communication: Independent Dressing (OT): Needs assistance Is this a change from baseline?: Pre-admission baseline Grooming: Needs assistance Is this a change from baseline?: Pre-admission baseline Feeding: Independent Bathing: Needs assistance Is this a change from baseline?: Pre-admission baseline Toileting: Needs assistance Is this a change from baseline?: Pre-admission baseline In/Out Bed: Needs assistance Is this a change from baseline?: Pre-admission baseline Walks in Home: Needs assistance Is this a change from baseline?: Pre-admission baseline Does the patient have difficulty walking or climbing stairs?: Yes Weakness of Legs: None Weakness of Arms/Hands: None  Permission Sought/Granted                  Emotional Assessment Appearance:: Appears stated age Attitude/Demeanor/Rapport: Engaged Affect (typically observed): Flat Orientation: : Oriented to Self, Oriented to Place, Oriented to  Time, Oriented to Situation Alcohol / Substance Use: Not Applicable Psych Involvement: No (comment)  Admission diagnosis:  Acute pulmonary edema ([place]) [J81.0] Hypoxia [R09.02] Chest pain at rest [R07.9] Patient Active Problem List   Diagnosis Date Noted  . Acute on chronic combined systolic (congestive) and diastolic (congestive) heart failure ([place]) 09/13/2018  . Chest pain at rest 09/13/2018  . Atypical chest pain 07/30/2018  . S/P  CABG (coronary artery bypass  graft) 04/01/2018  . Claudication ([place]) 04/01/2018  . Chronic diastolic (congestive) heart failure ([place]) 04/01/2018  . Paresthesia 12/07/2015  . BPH (benign prostatic hyperplasia)   . Atherosclerosis of native coronary artery of native heart without angina pectoris 10/01/2015  . Chronic systolic (congestive) heart failure ([place]) 10/01/2015  . Encounter for fitting or adjustment of implantable cardioverter-defibrillator (ICD)   . Ileus, postoperative ([place])   . Impaired nasal gastric feeding tube   . Impaired nasogastric feeding tube   . Ileus ([place])   . ESRD (end stage renal disease) ([place])   . Gallstones   . Gallbladder calculus 06/15/2015  . Numbness   . RUQ abdominal pain   . CHF exacerbation ([place]) 03/10/2015  . Heart failure, acute on chronic, systolic and diastolic ([place]) 68/37/2902  . Acute on chronic congestive heart failure ([place])   . Diabetes mellitus with complication ([place])   . CHF (congestive heart failure) ([place]) 08/03/2014  . Hypertensive urgency 08/03/2014  . Controlled type 2 diabetes with neuropathy ([place]) 08/03/2014  . Obesity (BMI 30-39.9) 08/03/2014  . Bilateral leg pain 08/03/2014  . Acute on chronic combined systolic and diastolic CHF (congestive heart failure) ([place]) 07/02/2014  . DM (diabetes mellitus), type 2 with renal complications ([place]) 12/08/5206  . 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 Essential hypertension, benign 06/29/2014  . Hepatic cirrhosis ([place]) 11/28/2013  . Cardiomyopathy, ischemic 11/28/2013  . Hepatitis C antibody test positive 11/28/2013  . Cirrhosis ([place])   . Type 2 diabetes mellitus with hyperglycemia ([place])   . Diabetes mellitus with hypoglycemia ([place]) 04/29/2013  . HCAP (healthcare-associated pneumonia) 04/28/2013  . DM type 2 (diabetes mellitus, type 2) ([place]) 04/28/2013  . Hyperlipemia   . Ventricular fibrillation ([place]) 03/08/2013  . Seizure ([place]) 03/07/2013   PCP:  System, Pcp  Not In Pharmacy:   [facility] [address] ([address]), [place] - [place] [address] [place] ([place]) [place] [address] Phone: [phone] Fax: [phone]  [facility] [place], [place] - [address] [place] [address] Phone: [phone] Fax: [phone]     Social Determinants of Health (SDOH) Interventions    Readmission Risk Interventions Readmission Risk Prevention Plan 09/20/2018  Transportation Screening Complete  HRI or Home Care Consult Complete  Social Work Consult for [place] Planning/Counseling Complete  Medication Review Press photographer) Complete  Some recent data might be hidden

## 2018-09-20 NOTE — Plan of Care (Signed)
  Problem: Pain Managment: Goal: General experience of comfort will improve Outcome: Progressing Note: Denies c/o pain or discomfort.   Problem: Safety: Goal: Ability to remain free from injury will improve Outcome: Progressing Note: Bed alarm and chair alarm in use.  Patient encouraged to call for assistance when getting out of bed.

## 2018-09-20 NOTE — Care Management Important Message (Signed)
Important Message  Patient Details  Name: Eric [name] MRN: [id number] Date of Birth: [birth year]   Medicare Important Message Given:  Yes     Shelda Altes 09/20/2018, 1:10 PM

## 2018-09-20 NOTE — Progress Notes (Signed)
PROGRESS NOTE  Eric [name] DGU:[id number] DOB: 07-02-1955 DOA: 09/12/2018 PCP: System, Pcp Not In  Brief History   Eric [name] a 63 y.o.malewith medical history significant ofESRD on dialysis TTS, CHF, CAD, HTN, AICD following a prior cardiac arrest, HCV with cirrhosis who presented to the ED with c/o chest pressure, radiates to arms, legs, and back. Onset around 9pm on the night of 09/12/2018.  This was associated with some shortness of breath but no nausea, vomiting, radiation to the neck and shoulder or any diaphoresis.  Received Tylenol, neurontin as well as hydralazine and imdur (which he apparently takes PRN for BP) for SBP 150. Pain hasnt really improved. Does have associated SOB.  ED Course:BNP 2881, CXR suggestive of mild pulmonary edema.EKG shows TWI in inferior and lateral leads which are possibly new.  Troponins however have remained around 38/39 and are stable.  BMP showed creatinine of 4.7.  Electrolytes were stable.  Chest x-ray showed cardiomegaly with pulmonary edema and mild bilateral groundglass opacity.  COVID-19 negative.  Patient was admitted under hospitalist service for further management.  Nephrology was consulted for dialysis.  Due to some bleeding at the AV fistula, vascular surgery was consulted and patient underwent fistula plication on Monday.  Patient then had positive blood culture reported on 09/16/2018 with staph epidermidis.  This was drawn on 09/13/2018.  Patient was started on vancomycin last night. Blood cultures repeated on 09/17/2018. The patient will require a 2 week course of vancomycin.   Consultants  . Vascular surgery . Nephrology  Procedures  . 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 . Dialysis TTS  Antibiotics   Anti-infectives (From admission, onward)   Start     Dose/Rate Route Frequency Ordered Stop   09/17/18 1200  vancomycin (VANCOCIN) IVPB 1000 mg/200 mL premix     1,000 mg 200 mL/hr over 60 Minutes  Intravenous Every T-Th-Sa (Hemodialysis) 09/16/18 2047     09/16/18 2100  vancomycin (VANCOCIN) 1,750 mg in sodium chloride 0.9 % 500 mL IVPB     1,750 mg 250 mL/hr over 120 Minutes Intravenous  Once 09/16/18 2047 09/17/18 0200   09/16/18 1030  ceFAZolin (ANCEF) IVPB 2g/100 mL premix     2 g 200 mL/hr over 30 Minutes Intravenous On call to O.R. 09/15/18 9563 09/17/18 0559     Subjective  The patient is awake, alert, and oriented x 3. He is complaining that he is re-acccumulating fluid in his peritoneal space.   Objective   Vitals:  Vitals:   09/20/18 0857 09/20/18 0901  BP: (!) 75/55 (!) 86/73  Pulse:    Resp:    Temp:    SpO2:      Exam:  Constitutional:  Patient is awake and alert. No acute distress. Respiratory:  . No increased work of breathing. . No wheezes, rales, or rhonchi . No tactile fremitus. Cardiovascular:  . Regular rate and rhythm . No murmurs, ectopy, or gallups . No lateral PMI. No thrills. Abdomen:  . Abdomen is soft, non-tender, somewhat distended today. . No hernias, masses, or organomegaly . Normoactive bowel sounds. Musculoskeletal:  . No cyanosis, clubbing or edema Skin:  . No rashes, lesions, ulcers . palpation of skin: no induration or nodules Neurologic:  . CN 2-12 intact . Sensation all 4 extremities intact Psychiatric:  . Mental status o Mood, affect appropriate o Orientation to person, place, time  . judgment and insight appear intact   I have personally reviewed the following:   Today's  Data  . Vitals, BMP  Micro Data  . Blood cultures x 2 positive for staph epidermidis.  Scheduled Meds: . aspirin EC  81 mg Oral Daily  . calcium acetate  667 mg Oral TID WC  . Chlorhexidine Gluconate Cloth  6 each Topical Q0600  . cinacalcet  30 mg Oral Q supper  . doxercalciferol  2 mcg Intravenous Q T,Th,Sa-HD  . feeding supplement (PRO-STAT SUGAR FREE 64)  30 mL Oral BID  . gabapentin  300 mg Oral QHS  . metoprolol tartrate  25 mg  Oral BID  . multivitamin  1 tablet Oral QHS  . pantoprazole  40 mg Oral Daily  . pneumococcal 23 valent vaccine  0.5 mL Intramuscular Tomorrow-1000  . rosuvastatin  20 mg Oral q1800  . sodium chloride flush  3 mL Intravenous Q12H   Continuous Infusions: . sodium chloride    . sodium chloride Stopped (09/17/18 0300)  . 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  For T wave inversion in lateral leads.  Troponin only slightly elevated around 39 but flat.  This is likely demand ischemia and not a sign of ACS.  Continue to monitor on telemetry.  Orthostatic hypotension: Hold antihypertensives. Blood pressures much improved this afternoon.  Acute hypoxic respiratory failure secondary to acute pulmonary edema/acute on chronic combined systolic congestive heart failure: Lasix on hold.  He takes that as needed on off dialysis.  He has been on room air for last 2 days.  This is resolved.  Essential hypertension: Blood pressure stable and on low normal side and had orthostatic hypotension while working with PT today.  Continue metoprolol twice daily.  Apparently he takes Imdur and hydralazine as needed at home.  Continue PRN hydralazine.  No Imdur.  ESRD on HHS: Dialysis on TTS schedule.  Nephrology consulted.  Dialysis per them.  History of liver cirrhosis/recurrent ascites: Patient has presented to ED twice in last 1 month with almost similar  symptoms.  He ended up having diagnostic paracentesis on 08/20/2018 which was unremarkable for any infection.  He returned back to ER on 09/11/2018 and ended up having another therapeutic paracentesis with removal of 3 L.  Underwent diagnostic and therapeutic paracentesis here on 09/14/2018 with removal of 1.8 L of yellow fluid.  Fluid analysis not indicative of any infection. He is reacumulating fluid and will need another paracentesis soon.   Chronic thrombocytopenia: Secondary to liver cirrhosis.  Stable.  No signs of bleeding.  Avoid heparin products.  Generalized weakness/orthostatic hypotension: PT OT was consulted to see patient before discharge on 09/16/2018 however patient was unable to complete any physical therapy/walking due to having orthostatic hypotension causing dizziness.  Patient was kept overnight.  I requested PT to see him early this morning so we can decide if he is going to require SNF versus home with home health however he was not seen despite of the request and currently patient is in hemodialysis.  Gram-positive  bacteremia: 2 of the 2 blood cultures growing staph epidermidis which resulted last night and patient was started on vancomycin, pharmacy to consult and per pharmacy recommendations, he will need 14 days of antibiotics.  Repeating blood cultures on 09/17/2018. They have had no growth.  I have seen and examined this patient myself. I have spent 34 minutes in his evaluation and care.  DVT prophylaxis: SCD/avoiding heparin products due to thrombocytopenia Code Status: Full code Family Communication: None present Disposition Plan: Home with home health  Eric Glauber, DO [facility] Direct contact: see [email or web address]  7PM-7AM contact night coverage as above 09/20/2018, 6:23 PM  LOS: 5 days

## 2018-09-20 NOTE — Progress Notes (Signed)
[name] Progress Note   Subjective:   Seen and examined in room.  Sitting in bedside chair.  No new complaints. Denies CP, SOB, and n/v/d.  Objective Vitals:   09/19/18 2113 09/20/18 0648 09/20/18 0857 09/20/18 0901  BP: 101/83 108/70 (!) 75/55 (!) 86/73  Pulse: 70 70    Resp:      Temp:  98.2 F (36.8 C)    TempSrc:  Oral    SpO2:  93%    Weight:  84.7 kg    Height:       Physical Exam General:NAD, WDWN, well appearing male Heart:RRR Lungs:CTAB Abdomen:distended, slight tense  Extremities:No LE edema, wearing compression socks Dialysis Access: RU AVR, +b/t, horizontal incision w/sutures in place   [facility] Weights   09/17/18 1512 09/18/18 0504 09/20/18 0648  Weight: 81.6 kg 81 kg 84.7 kg    Intake/Output Summary (Last 24 hours) at 09/20/2018 1049 Last data filed at 09/20/2018 0918 Gross per 24 hour  Intake 440 ml  Output 1001 ml  Net -561 ml    Additional Objective Labs: Basic Metabolic Panel: Recent Labs  Lab 09/13/18 1156  09/17/18 1625 09/18/18 0305 09/19/18 0311  NA 137   < > 130* 131* 130*  K 4.3   < > 4.4 4.5 5.0  CL 95*   < > 94* 95* 92*  CO2 32   < > 25 24 25   GLUCOSE 92   < > 95 85 69*  BUN 20   < > 24* 31* 51*  CREATININE 5.74*   < > 4.27* 5.58* 7.26*  CALCIUM 8.2*   < > 7.5* 7.3* 7.7*  PHOS 3.2  --  2.6  --   --    < > = values in this interval not displayed.   Liver Function Tests: Recent Labs  Lab 09/13/18 1156 09/17/18 1625  ALBUMIN 3.2* 2.8*   CBC: Recent Labs  Lab 09/13/18 1156 09/15/18 0851 09/17/18 1400  WBC 4.1 5.0 6.5  HGB 10.2* 9.4* 9.6*  HCT 34.2* 31.2* 30.9*  MCV 106.5* 103.7* 102.7*  PLT 63* 56* 63*   Blood Culture    Component Value Date/Time   SDES BLOOD LEFT HAND 09/17/2018 1631   SPECREQUEST  09/17/2018 1631    AEROBIC BOTTLE ONLY Blood Culture results may not be optimal due to an inadequate volume of blood received in culture bottles   CULT  09/17/2018 1631    NO GROWTH 2 DAYS Performed  at [facility] [address] [place], [place] [address]    REPTSTATUS PENDING 09/17/2018 1631    CBG: Recent Labs  Lab 09/17/18 0748 09/17/18 1631 09/17/18 2212 09/18/18 0649 09/18/18 0753  GLUCAP 74 80 82 85 87    Medications: . sodium chloride    . sodium chloride Stopped (09/17/18 0300)  . vancomycin Stopped (09/19/18 1520)   . aspirin EC  81 mg Oral Daily  . calcium acetate  667 mg Oral TID WC  . Chlorhexidine Gluconate Cloth  6 each Topical Q0600  . cinacalcet  30 mg Oral Q supper  . doxercalciferol  2 mcg Intravenous Q T,Th,Sa-HD  . feeding supplement (PRO-STAT SUGAR FREE 64)  30 mL Oral BID  . gabapentin  300 mg Oral QHS  . metoprolol tartrate  25 mg Oral BID  . multivitamin  1 tablet Oral QHS  . pantoprazole  40 mg Oral Daily  . pneumococcal 23 valent vaccine  0.5 mL Intramuscular Tomorrow-1000  . rosuvastatin  20  mg Oral q1800  . sodium chloride flush  3 mL Intravenous Q12H    Dialysis Orders: TTS at [facility] 3:30hr, 400/A1.5, EDW 81kg, 2K/2Ca, UFP #4, AVF, no heparin - Hectoral 72mcg IV q HD - Mircera 240mcg IV q 2 weeks (last 8/18)  Assessment/Plan: 1. Dyspnea/Pulmonary edema - extra HD on 8/21 for volume removal.  Symptoms improved. 2. ESRD - on HD TTS.  Orders written for HD tomorrow per regular schedule. K 5.0 pre HD yesterday. 3. Anemia of CKD- Hgb 9.6. ESA due 9/1. 4. Secondary hyperparathyroidism - CCa and phos in goal but on the low side, continue to follow. Continue binders, sensipar, and VDRA for now. 5. HTN/volume - BP soft, metoprolol held.  Continue to challenge EDW, will need lower dry on d/c.   6. Nutrition - renal diet w/fluid restrictions. Alb low, continue protein supplements 7. Thrombocytopenia - chronic, No heparin w/HD 8. AVF ulceration - VVS following, s/p AVF revision/plication on 8/75 9. MRSE bacteremia: [facility] 8/21 +, started on Vanc on 8/24, will need 2 week course.  Repeat [facility] 8/25 NGTD  Jen Mow, PA-C [facility] Pager: [phone] 09/20/2018,10:49 AM  LOS: 7 days

## 2018-09-21 LAB — CBC WITH DIFFERENTIAL/PLATELET
Abs Immature Granulocytes: 0.01 10*3/uL (ref 0.00–0.07)
Basophils Absolute: 0 10*3/uL (ref 0.0–0.1)
Basophils Relative: 0 %
Eosinophils Absolute: 0.1 10*3/uL (ref 0.0–0.5)
Eosinophils Relative: 3 %
HCT: 29.6 % — ABNORMAL LOW (ref 39.0–52.0)
Hemoglobin: 9.3 g/dL — ABNORMAL LOW (ref 13.0–17.0)
Immature Granulocytes: 0 %
Lymphocytes Relative: 15 %
Lymphs Abs: 0.8 10*3/uL (ref 0.7–4.0)
MCH: 31.4 pg (ref 26.0–34.0)
MCHC: 31.4 g/dL (ref 30.0–36.0)
MCV: 100 fL (ref 80.0–100.0)
Monocytes Absolute: 0.7 10*3/uL (ref 0.1–1.0)
Monocytes Relative: 14 %
Neutro Abs: 3.3 10*3/uL (ref 1.7–7.7)
Neutrophils Relative %: 68 %
Platelets: 61 10*3/uL — ABNORMAL LOW (ref 150–400)
RBC: 2.96 MIL/uL — ABNORMAL LOW (ref 4.22–5.81)
RDW: 15.3 % (ref 11.5–15.5)
WBC: 4.9 10*3/uL (ref 4.0–10.5)
nRBC: 0 % (ref 0.0–0.2)

## 2018-09-21 LAB — COMPREHENSIVE METABOLIC PANEL
ALT: 8 U/L (ref 0–44)
AST: 17 U/L (ref 15–41)
Albumin: 2.6 g/dL — ABNORMAL LOW (ref 3.5–5.0)
Alkaline Phosphatase: 66 U/L (ref 38–126)
Anion gap: 13 (ref 5–15)
BUN: 49 mg/dL — ABNORMAL HIGH (ref 8–23)
CO2: 24 mmol/L (ref 22–32)
Calcium: 7.7 mg/dL — ABNORMAL LOW (ref 8.9–10.3)
Chloride: 90 mmol/L — ABNORMAL LOW (ref 98–111)
Creatinine, Ser: 7.06 mg/dL — ABNORMAL HIGH (ref 0.61–1.24)
GFR calc Af Amer: 9 mL/min — ABNORMAL LOW (ref >60)
GFR calc non Af Amer: 7 mL/min — ABNORMAL LOW (ref >60)
Glucose, Bld: 87 mg/dL (ref 70–99)
Potassium: 4.5 mmol/L (ref 3.5–5.1)
Sodium: 127 mmol/L — ABNORMAL LOW (ref 135–145)
Total Bilirubin: 0.9 mg/dL (ref 0.3–1.2)
Total Protein: 7 g/dL (ref 6.5–8.1)

## 2018-09-21 LAB — VANCOMYCIN, RANDOM: Vancomycin Rm: 28

## 2018-09-21 MED ORDER — DOXERCALCIFEROL 4 MCG/2ML IV SOLN
INTRAVENOUS | Status: AC
  Filled 2018-09-21: qty 2

## 2018-09-21 MED ORDER — VANCOMYCIN HCL IN DEXTROSE 750-5 MG/150ML-% IV SOLN: 750.0000 mg | INTRAVENOUS | Status: DC

## 2018-09-21 MED ORDER — VANCOMYCIN HCL IN DEXTROSE 1-5 GM/200ML-% IV SOLN
INTRAVENOUS | Status: AC
  Administered 2018-09-21: 1000 mg
  Filled 2018-09-21: qty 200

## 2018-09-21 MED ORDER — ALBUMIN HUMAN 25 % IV SOLN: 50.0000 g | Freq: Once | INTRAVENOUS | Status: DC | PRN

## 2018-09-21 NOTE — Progress Notes (Signed)
PROGRESS NOTE  Eric [name] ZOX:[id number] DOB: Oct 23, 1955 DOA: 09/12/2018 PCP: System, Pcp Not In  Brief History   Eric [name] a 63 y.o.malewith medical history significant ofESRD on dialysis TTS, CHF, CAD, HTN, AICD following a prior cardiac arrest, HCV with cirrhosis who presented to the ED with c/o chest pressure, radiates to arms, legs, and back. Onset around 9pm on the night of 09/12/2018.  This was associated with some shortness of breath but no nausea, vomiting, radiation to the neck and shoulder or any diaphoresis.  Received Tylenol, neurontin as well as hydralazine and imdur (which he apparently takes PRN for BP) for SBP 150. Pain hasnt really improved. Does have associated SOB.  ED Course:BNP 2881, CXR suggestive of mild pulmonary edema.EKG shows TWI in inferior and lateral leads which are possibly new.  Troponins however have remained around 38/39 and are stable.  BMP showed creatinine of 4.7.  Electrolytes were stable.  Chest x-ray showed cardiomegaly with pulmonary edema and mild bilateral groundglass opacity.  COVID-19 negative.  Patient was admitted under hospitalist service for further management.  Nephrology was consulted for dialysis.  Due to some bleeding at the AV fistula, vascular surgery was consulted and patient underwent fistula plication on Monday.  Patient then had positive blood culture reported on 09/16/2018 with staph epidermidis.  This was drawn on 09/13/2018.  Patient was started on vancomycin last night. Blood cultures repeated on 09/17/2018. The patient will require a 2 week course of vancomycin. Today is day 6/14. He should be able to get his vancomycin with dialysis upon discharge.   Consultants  . Vascular surgery . Nephrology . Interventional radiology  Procedures  . 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 . Dialysis TTS . Paracentesis  Antibiotics   Anti-infectives (From admission, onward)   Start     Dose/Rate Route  Frequency Ordered Stop   09/21/18 1200  vancomycin (VANCOCIN) IVPB 750 mg/150 ml premix     750 mg 150 mL/hr over 60 Minutes Intravenous Every T-Th-Sa (Hemodialysis) 09/21/18 0925     09/21/18 0922  vancomycin (VANCOCIN) 1-5 GM/200ML-% IVPB    Note to Pharmacy: Rodell Perna   : cabinet override      09/21/18 0922 09/21/18 1023   09/17/18 1200  vancomycin (VANCOCIN) IVPB 1000 mg/200 mL premix  Status:  Discontinued     1,000 mg 200 mL/hr over 60 Minutes Intravenous Every T-Th-Sa (Hemodialysis) 09/16/18 2047 09/21/18 0925   09/16/18 2100  vancomycin (VANCOCIN) 1,750 mg in sodium chloride 0.9 % 500 mL IVPB     1,750 mg 250 mL/hr over 120 Minutes Intravenous  Once 09/16/18 2047 09/17/18 0200   09/16/18 1030  ceFAZolin (ANCEF) IVPB 2g/100 mL premix     2 g 200 mL/hr over 30 Minutes Intravenous On call to O.R. 09/15/18 8119 09/17/18 0559     Subjective  The patient is awake, alert, and oriented x 3. He is complaining that he is re-acccumulating fluid in his peritoneal space, and that it is very uncomfortable.  Objective   Vitals:  Vitals:   09/21/18 1303 09/21/18 1524  BP: 113/67 134/74  Pulse: 70   Resp: 12   Temp:  97.9 F (36.6 C)  SpO2: 97%     Exam:  Constitutional:  Patient is awake and alert. No acute distress. Respiratory:  . No increased work of breathing. . No wheezes, rales, or rhonchi . No tactile fremitus. Cardiovascular:  . Regular rate and rhythm . No murmurs, ectopy, or gallups .  No lateral PMI. No thrills. Abdomen:  . Abdomen is soft, non-tender, very distended today. . No hernias, masses, or organomegaly . Normoactive bowel sounds. Musculoskeletal:  . No cyanosis, clubbing or edema Skin:  . No rashes, lesions, ulcers . palpation of skin: no induration or nodules Neurologic:  . CN 2-12 intact . Sensation all 4 extremities intact Psychiatric:  . Mental status o Mood, affect appropriate o Orientation to person, place, time  . judgment and  insight appear intact   I have personally reviewed the following:   Today's Data  . Vitals, BMP, CBC  Micro Data  . Blood cultures x 2 positive for staph epidermidis.  Scheduled Meds: . aspirin EC  81 mg Oral Daily  . calcium acetate  667 mg Oral TID WC  . Chlorhexidine Gluconate Cloth  6 each Topical Q0600  . cinacalcet  30 mg Oral Q supper  . doxercalciferol  2 mcg Intravenous Q T,Th,Sa-HD  . feeding supplement (PRO-STAT SUGAR FREE 64)  30 mL Oral BID  . gabapentin  300 mg Oral QHS  . metoprolol tartrate  25 mg Oral BID  . multivitamin  1 tablet Oral QHS  . pantoprazole  40 mg Oral Daily  . pneumococcal 23 valent vaccine  0.5 mL Intramuscular Tomorrow-1000  . rosuvastatin  20 mg Oral q1800  . sodium chloride flush  3 mL Intravenous Q12H   Continuous Infusions: . sodium chloride    . sodium chloride Stopped (09/17/18 0300)  . 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  For T wave inversion in lateral leads.  Troponin only slightly elevated around 39 but flat.  This is likely demand ischemia and not a sign of ACS.  Continue to monitor on telemetry.  Orthostatic hypotension: Hold antihypertensives. Blood pressures much improved this afternoon.  Acute hypoxic respiratory failure secondary to acute pulmonary edema/acute on chronic combined systolic congestive heart failure: Lasix on hold.  He takes that as needed on off dialysis.  He has been on room air  for last 2 days.  This is resolved.  Essential hypertension: Blood pressure stable and on low normal side and had orthostatic hypotension while working with PT today.  Continue metoprolol twice daily.  Apparently he takes Imdur and hydralazine as needed at home.  Continue PRN hydralazine.  No Imdur.  ESRD on HHS: Dialysis on TTS schedule.  Nephrology consulted.  Dialysis per them.  History of liver cirrhosis/recurrent ascites: Patient has presented to ED twice in last 1 month with almost similar symptoms.  He ended up having diagnostic paracentesis on 08/20/2018 which was unremarkable for any infection.  He returned back to ER on 09/11/2018 and ended up having another therapeutic paracentesis with removal of 3 L.  Underwent diagnostic and therapeutic paracentesis here on 09/14/2018 with removal of 1.8 L of yellow fluid.  Fluid analysis not indicative of any infection. He is reacumulating fluid and will need another paracentesis as he has become very uncomfortable  in his abdomen. I have ordered it. He will get albumin with his procedure.  Chronic thrombocytopenia: Secondary to liver cirrhosis. Stable. No signs of bleeding. Avoid heparin products.  Generalized weakness/orthostatic hypotension: Resolved.   Gram-positive bacteremia: 2 of the 2 blood cultures growing staph epidermidis which resulted last night and patient was started on vancomycin, pharmacy to consult and per pharmacy recommendations, he will need 14 days of Vancomycin. Today will make day 6/14. He should be able to get his remaining vancomycin with dialysis.  Repeated blood cultures on 09/17/2018. They have had no growth.  I have seen and examined this patient myself. I have spent 32 minutes in his evaluation and care.  DVT prophylaxis: SCD/avoiding heparin products due to thrombocytopenia Code Status: Full code Family Communication: None present Disposition Plan: Home with home health  Alwaleed Obeso, DO [facility] Direct  contact: see [email or web address]  7PM-7AM contact night coverage as above 09/21/2018, 3:56 PM  LOS: 5 days

## 2018-09-21 NOTE — Procedures (Signed)
I was present at this dialysis session. I have reviewed the session itself and made appropriate changes.   2K bath, UF goal 2L. Successful cannulation of AVF this AM.  No new issues. Repeat BCx 8/25 NGTD >3d.    Filed Weights   09/18/18 0504 09/20/18 0648 09/21/18 0535  Weight: 81 kg 84.7 kg 84.1 kg    Recent Labs  Lab 09/17/18 1625  09/21/18 0342  NA 130*   < > 127*  K 4.4   < > 4.5  CL 94*   < > 90*  CO2 25   < > 24  GLUCOSE 95   < > 87  BUN 24*   < > 49*  CREATININE 4.27*   < > 7.06*  CALCIUM 7.5*   < > 7.7*  PHOS 2.6  --   --    < > = values in this interval not displayed.    Recent Labs  Lab 09/15/18 0851 09/17/18 1400 09/21/18 0342  WBC 5.0 6.5 4.9  NEUTROABS  --   --  3.3  HGB 9.4* 9.6* 9.3*  HCT 31.2* 30.9* 29.6*  MCV 103.7* 102.7* 100.0  PLT 56* 63* 61*    Scheduled Meds: . aspirin EC  81 mg Oral Daily  . calcium acetate  667 mg Oral TID WC  . Chlorhexidine Gluconate Cloth  6 each Topical Q0600  . cinacalcet  30 mg Oral Q supper  . doxercalciferol  2 mcg Intravenous Q T,Th,Sa-HD  . feeding supplement (PRO-STAT SUGAR FREE 64)  30 mL Oral BID  . gabapentin  300 mg Oral QHS  . metoprolol tartrate  25 mg Oral BID  . multivitamin  1 tablet Oral QHS  . pantoprazole  40 mg Oral Daily  . pneumococcal 23 valent vaccine  0.5 mL Intramuscular Tomorrow-1000  . rosuvastatin  20 mg Oral q1800  . sodium chloride flush  3 mL Intravenous Q12H   Continuous Infusions: . sodium chloride    . sodium chloride Stopped (09/17/18 0300)  . vancomycin Stopped (09/19/18 1520)   PRN Meds:.sodium chloride, sodium chloride, acetaminophen, diphenhydrAMINE, hydrALAZINE, morphine injection, nitroGLYCERIN, ondansetron (ZOFRAN) IV, oxyCODONE-acetaminophen, sodium chloride flush   Pearson Grippe  MD 09/21/2018, 8:28 AM

## 2018-09-21 NOTE — Progress Notes (Signed)
Pharmacy Antibiotic Note  Eric [name] is a 63 y.o. male admitted on 09/12/2018 with bacteremia.  Pharmacy has been consulted for vancomycin dosing for staphylococcus epidermidis bacteremia. This patient is currently receiving HD on TTS.  This morning, pre-HD vancomycin level was above goal at 28 (goal 15-25) . Patient is afebrile, WBC WNL. Day 6/14 of antibiotics.   Vancomycin 8/24 >>  8/21 Blood Cx: 2/2 Staphylococcus epidermidis, MRSE  Plan: - Given borderline weight (81-84 kg), will decrease vancomycin to 750 mg qHD TTS. - Monitor vancomycin levels as needed, CBC, and clinical progress  Height: 5\' 6"  (167.6 cm) Weight: 184 lb 4.9 oz (83.6 kg) IBW/kg (Calculated) : 63.8  Temp (24hrs), Avg:98.5 F (36.9 C), Min:98.4 F (36.9 C), Max:98.6 F (37 C)  Recent Labs  Lab 09/15/18 0851  09/17/18 0324 09/17/18 1400 09/17/18 1625 09/18/18 0305 09/19/18 0311 09/21/18 0342  WBC 5.0  --   --  6.5  --   --   --  4.9  CREATININE  --    < > 7.14*  --  4.27* 5.58* 7.26* 7.06*  VANCORANDOM  --   --   --   --   --   --   --  28   < > = values in this interval not displayed.    Estimated Creatinine Clearance: 10.7 mL/min (A) (by C-G formula based on SCr of 7.06 mg/dL (H)).    No Known Allergies  Agnes Lawrence, PharmD PGY1 Pharmacy Resident

## 2018-09-22 ENCOUNTER — Inpatient Hospital Stay (HOSPITAL_COMMUNITY): Payer: Medicare Other

## 2018-09-22 LAB — COMPREHENSIVE METABOLIC PANEL
ALT: 9 U/L (ref 0–44)
AST: 21 U/L (ref 15–41)
Albumin: 2.5 g/dL — ABNORMAL LOW (ref 3.5–5.0)
Alkaline Phosphatase: 72 U/L (ref 38–126)
Anion gap: 11 (ref 5–15)
BUN: 41 mg/dL — ABNORMAL HIGH (ref 8–23)
CO2: 28 mmol/L (ref 22–32)
Calcium: 8 mg/dL — ABNORMAL LOW (ref 8.9–10.3)
Chloride: 92 mmol/L — ABNORMAL LOW (ref 98–111)
Creatinine, Ser: 6.44 mg/dL — ABNORMAL HIGH (ref 0.61–1.24)
GFR calc Af Amer: 10 mL/min — ABNORMAL LOW (ref >60)
GFR calc non Af Amer: 8 mL/min — ABNORMAL LOW (ref >60)
Glucose, Bld: 97 mg/dL (ref 70–99)
Potassium: 4.7 mmol/L (ref 3.5–5.1)
Sodium: 131 mmol/L — ABNORMAL LOW (ref 135–145)
Total Bilirubin: 0.7 mg/dL (ref 0.3–1.2)
Total Protein: 6.9 g/dL (ref 6.5–8.1)

## 2018-09-22 LAB — GLUCOSE, PLEURAL OR PERITONEAL FLUID: Glucose, Fluid: 102 mg/dL

## 2018-09-22 LAB — CULTURE, BLOOD (ROUTINE X 2)
Culture: NO GROWTH
Culture: NO GROWTH

## 2018-09-22 LAB — BODY FLUID CELL COUNT WITH DIFFERENTIAL
Lymphs, Fluid: 35 %
Monocyte-Macrophage-Serous Fluid: 31 % — ABNORMAL LOW (ref 50–90)
Neutrophil Count, Fluid: 34 % — ABNORMAL HIGH (ref 0–25)
Total Nucleated Cell Count, Fluid: 210 cu mm (ref 0–1000)

## 2018-09-22 LAB — GRAM STAIN

## 2018-09-22 LAB — LACTATE DEHYDROGENASE, PLEURAL OR PERITONEAL FLUID: LD, Fluid: 75 U/L — ABNORMAL HIGH (ref 3–23)

## 2018-09-22 LAB — ALBUMIN, PLEURAL OR PERITONEAL FLUID: Albumin, Fluid: 1.8 g/dL

## 2018-09-22 MED ORDER — LIDOCAINE HCL (PF) 1 % IJ SOLN
INTRAMUSCULAR | Status: AC
  Filled 2018-09-22: qty 30

## 2018-09-22 NOTE — Procedures (Signed)
PROCEDURE SUMMARY:  Successful US guided paracentesis from RLQ.  Yielded 3.4 L of slightly hazy amber fluid.  No immediate complications.  Pt tolerated well.   Specimen was sent for labs.  EBL < 43mL  Ascencion Dike PA-C 09/22/2018 9:56 AM

## 2018-09-22 NOTE — Progress Notes (Signed)
[name] Progress Note   Subjective:   Patient seen and examined at bedside.  Sitting in bedside chair.  Complains of abdominal pain, feeling off balance and LE weakness.  Per nurse when ambulating him yesterday his legs "gave out" and having trouble sitting up today.  Continues to have some SOB.  HD yesterday tolerated well using RU AVF.   Objective Vitals:   09/22/18 0920 09/22/18 0922 09/22/18 0927 09/22/18 0940  BP: 95/72 93/70 91/66  90/64  Pulse:      Resp:      Temp:      TempSrc:      SpO2:      Weight:      Height:       Physical Exam General:NAD, well appearing male, laying in bedside chair Heart:RRR Lungs:CTAB anteriorly Abdomen:+distended, tense, non tender Extremities:no LE edema, wearing compression socks Dialysis Access: RU AVF +b, dressed   Filed Weights   09/21/18 0735 09/21/18 1123 09/22/18 0609  Weight: 83.6 kg 82.1 kg 83.4 kg    Intake/Output Summary (Last 24 hours) at 09/22/2018 1017 Last data filed at 09/22/2018 0100 Gross per 24 hour  Intake 720 ml  Output 1500 ml  Net -780 ml    Additional Objective Labs: Basic Metabolic Panel: Recent Labs  Lab 09/17/18 1625  09/19/18 0311 09/21/18 0342 09/22/18 0419  NA 130*   < > 130* 127* 131*  K 4.4   < > 5.0 4.5 4.7  CL 94*   < > 92* 90* 92*  CO2 25   < > 25 24 28   GLUCOSE 95   < > 69* 87 97  BUN 24*   < > 51* 49* 41*  CREATININE 4.27*   < > 7.26* 7.06* 6.44*  CALCIUM 7.5*   < > 7.7* 7.7* 8.0*  PHOS 2.6  --   --   --   --    < > = values in this interval not displayed.   Liver Function Tests: Recent Labs  Lab 09/17/18 1625 09/21/18 0342 09/22/18 0419  AST  --  17 21  ALT  --  8 9  ALKPHOS  --  66 72  BILITOT  --  0.9 0.7  PROT  --  7.0 6.9  ALBUMIN 2.8* 2.6* 2.5*   CBC: Recent Labs  Lab 09/17/18 1400 09/21/18 0342  WBC 6.5 4.9  NEUTROABS  --  3.3  HGB 9.6* 9.3*  HCT 30.9* 29.6*  MCV 102.7* 100.0  PLT 63* 61*   CBG: Recent Labs  Lab 09/17/18 0748  09/17/18 1631 09/17/18 2212 09/18/18 0649 09/18/18 0753  GLUCAP 74 80 82 85 87   Studies/Results: US Paracentesis  Result Date: 09/22/2018 INDICATION: Abdominal distention secondary to ascites. History of end-stage renal disease as well as cirrhosis. Request for diagnostic and therapeutic paracentesis. EXAM: ULTRASOUND GUIDED RIGHT LOWER QUADRANT PARACENTESIS MEDICATIONS: None. COMPLICATIONS: None immediate. PROCEDURE: Informed written consent was obtained from the patient after a discussion of the risks, benefits and alternatives to treatment. A timeout was performed prior to the initiation of the procedure. Initial ultrasound scanning demonstrates a large amount of ascites within the right lower abdominal quadrant. The right lower abdomen was prepped and draped in the usual sterile fashion. 1% lidocaine with epinephrine was used for local anesthesia. Following this, a 19 gauge, 7-cm, Yueh catheter was introduced. An ultrasound image was saved for documentation purposes. The paracentesis was performed. The catheter was removed and a dressing was applied. The patient tolerated the procedure  well without immediate post procedural complication. FINDINGS: A total of approximately 3.4 L of slightly hazy, amber colored fluid was removed. Samples were sent to the laboratory as requested by the clinical team. IMPRESSION: Successful ultrasound-guided paracentesis yielding 3.4 liters of peritoneal fluid. Read by: Ascencion Dike PA-C Electronically Signed   By: Jacqulynn Cadet M.D.   On: 09/22/2018 10:04    Medications: . sodium chloride    . sodium chloride Stopped (09/17/18 0300)  . albumin human    . vancomycin     . aspirin EC  81 mg Oral Daily  . calcium acetate  667 mg Oral TID WC  . Chlorhexidine Gluconate Cloth  6 each Topical Q0600  . cinacalcet  30 mg Oral Q supper  . doxercalciferol  2 mcg Intravenous Q T,Th,Sa-HD  . feeding supplement (PRO-STAT SUGAR FREE 64)  30 mL Oral BID  . gabapentin   300 mg Oral QHS  . lidocaine (PF)      . metoprolol tartrate  25 mg Oral BID  . multivitamin  1 tablet Oral QHS  . pantoprazole  40 mg Oral Daily  . pneumococcal 23 valent vaccine  0.5 mL Intramuscular Tomorrow-1000  . rosuvastatin  20 mg Oral q1800  . sodium chloride flush  3 mL Intravenous Q12H    Dialysis Orders: TTS at [facility] 3:30hr, 400/A1.5, EDW 81kg, 2K/2Ca, UFP #4, AVF, no heparin - Hectoral 46mcg IV q HD - Mircera 233mcg IV q 2 weeks (last 8/18)  Assessment/Plan: 1. Dyspnea/Pulmonary edema - extra HD on 8/21 for volume removal.  Symptoms improved. 2. Ascites - Recurrent, multiple paracentesis - 7/28, 8/19,  3. Weakness/Instability - weakness possibly d/t deconditioning and instability possibly center of gravity  thrown off from large ascites.    4. ESRD - on HD TTS.  K 4.7. Next HD on 9/1. 5. Anemia of CKD- Hgb 9.3. ESA due 9/1. 6. Secondary hyperparathyroidism - CCa and phos in goal. Continue binders, sensipar, and VDRA for now. 7. HTN/volume - BP soft, metoprolol held.  Continue to challenge EDW, likely will need lower dry on d/c. Hard to determine EDW with recurrent ascites.  8. Nutrition - renal diet w/fluid restrictions. Alb low, continue protein supplements 9. Thrombocytopenia - chronic, No heparin w/HD 10. AVF ulceration - VVS following, s/p AVF revision/plication on 0/13 11. MRSE bacteremia: [facility] 8/21 +, started on Vanc on 8/24, will need 2 week course.  Repeat [facility] 8/25 NGTD   Jen Mow, PA-C [facility] Pager: [phone] 09/22/2018,10:17 AM  LOS: 9 days

## 2018-09-22 NOTE — Progress Notes (Signed)
Physical Therapy Treatment Patient Details Name: Eric [name] MRN: [id number] DOB: [birth year] Today's Date: 09/22/2018    History of Present Illness Pt is a 63 y/o male admitted secondary to chest pain. Pt with CHF and likely demand ischemia. 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 PMH includes DM, hepatic cirrhosis, HTN, ESRD on HD TTS, s/p AICD.    PT Comments    Pt with regression towards physical therapy goals. BP 95/58 (71) sitting up in recliner, pt became more unresponsive during limited ambulation with near fall, BP 65/30 (41). After reclined, pt rebounded to 108/68 (81). Pt with no awareness of incident or deficits despite attempts at education. Pt requiring moderate assist for stability using walker. Updating d/c plan to SNF; if refuses, will need to be at wheelchair level only for mobility as pt presents as high fall risk.    Follow Up Recommendations  SNF;Supervision/Assistance - 24 hour     Equipment Recommendations  Wheelchair (measurements PT)    Recommendations for Other Services       Precautions / Restrictions Precautions Precautions: Fall;Other (comment) Precaution Comments: Check BP Restrictions Weight Bearing Restrictions: No    Mobility  Bed Mobility               General bed mobility comments: OOB in chair  Transfers Overall transfer level: Needs assistance Equipment used: Rolling walker (2 wheeled) Transfers: Sit to/from Stand Sit to Stand: Min guard            Ambulation/Gait Ambulation/Gait assistance: Mod assist Gait Distance (Feet): 25 Feet Assistive device: Rolling walker (2 wheeled) Gait Pattern/deviations: Step-through pattern;Decreased stride length;Trunk flexed;Decreased dorsiflexion - right;Decreased dorsiflexion - left Gait velocity: decreased Gait velocity interpretation: <1.31 ft/sec, indicative of household ambulator General Gait Details: requiring cues for sequencing, motor  initiation/coordination, direction. Pt running walker into objects without awareness, requiring manual assist to correct. Noted bilateral knee instability, increased flexion towards end of walk, requiring up to mod assist and verbal cues to correct   Stairs             Wheelchair Mobility    Modified Rankin (Stroke Patients Only)       Balance Overall balance assessment: Needs assistance Sitting-balance support: No upper extremity supported;Feet supported Sitting balance-Leahy Scale: Fair     Standing balance support: Bilateral upper extremity supported;During functional activity Standing balance-Leahy Scale: Poor Standing balance comment: Reliant on BUE support                             Cognition Arousal/Alertness: Awake/alert Behavior During Therapy: Flat affect Overall Cognitive Status: Impaired/Different from baseline Area of Impairment: Orientation;Attention;Memory;Following commands;Safety/judgement;Awareness;Problem solving                 Orientation Level: Disoriented to;Time;Situation Current Attention Level: Focused Memory: Decreased short-term memory Following Commands: Follows one step commands inconsistently Safety/Judgement: Decreased awareness of safety;Decreased awareness of deficits Awareness: Intellectual Problem Solving: Slow processing;Requires verbal cues General Comments: Pt stating month is January, looks confused when reoriented. Does not seem to respond well to education about hypotension and that he is not yet safe to go home      Exercises      General Comments        Pertinent Vitals/Pain Pain Assessment: Faces Faces Pain Scale: Hurts even more Pain Location: buttocks with sitting Pain Descriptors / Indicators: Burning Pain Intervention(s): Monitored during session;Other (comment)(RN notified)    Home Living  Prior Function            PT Goals (current goals can now be found  in the care plan section) Acute Rehab PT Goals Patient Stated Goal: to go home Potential to Achieve Goals: Fair Progress towards PT goals: Not progressing toward goals - comment(hypotensive)    Frequency    Min 3X/week      PT Plan Discharge plan needs to be updated    Co-evaluation              AM-PAC PT "6 Clicks" Mobility   Outcome Measure  Help needed turning from your back to your side while in a flat bed without using bedrails?: A Little Help needed moving from lying on your back to sitting on the side of a flat bed without using bedrails?: A Little Help needed moving to and from a bed to a chair (including a wheelchair)?: A Little Help needed standing up from a chair using your arms (e.g., wheelchair or bedside chair)?: A Little Help needed to walk in hospital room?: A Lot Help needed climbing 3-5 steps with a railing? : Total 6 Click Score: 15    End of Session Equipment Utilized During Treatment: Gait belt Activity Tolerance: Treatment limited secondary to medical complications (Comment) Patient left: with call bell/phone within reach;in chair;with chair alarm set Nurse Communication: Mobility status;Other (comment)(BP) PT Visit Diagnosis: Other abnormalities of gait and mobility (R26.89);Unsteadiness on feet (R26.81);Muscle weakness (generalized) (M62.81)     Time: 1539-1600 PT Time Calculation (min) (ACUTE ONLY): 21 min  Charges:  $Therapeutic Activity: 8-22 mins                     Ellamae Sia, PT, DPT Acute Rehabilitation Services Pager [phone] Office [phone]    Willy Eddy 09/22/2018, 4:59 PM

## 2018-09-22 NOTE — Progress Notes (Signed)
[name] SBP 92 this pm, pt also noted to be orthostatic previously.  Pt also noted to cough significantly after dinner and medications.  [facility] notified via amion and requested metoprolol parameters and ? Speech eval for swallowing.  Awaiting further orders.  Will cont to monitor closely.

## 2018-09-22 NOTE — Progress Notes (Signed)
PROGRESS NOTE  Eric [name] JYN:[id number] DOB: [birth year] DOA: 09/12/2018 PCP: System, Pcp Not In  Brief History   Eric [name] a 63 y.o.malewith medical history significant ofESRD on dialysis TTS, CHF, CAD, HTN, AICD following a prior cardiac arrest, HCV with cirrhosis who presented to the ED with c/o chest pressure, radiates to arms, legs, and back. Onset around 9pm on the night of 09/12/2018.  This was associated with some shortness of breath but no nausea, vomiting, radiation to the neck and shoulder or any diaphoresis.  Received Tylenol, neurontin as well as hydralazine and imdur (which he apparently takes PRN for BP) for SBP 150. Pain hasnt really improved. Does have associated SOB.  ED Course:BNP 2881, CXR suggestive of mild pulmonary edema.EKG shows TWI in inferior and lateral leads which are possibly new.  Troponins however have remained around 38/39 and are stable.  BMP showed creatinine of 4.7.  Electrolytes were stable.  Chest x-ray showed cardiomegaly with pulmonary edema and mild bilateral groundglass opacity.  COVID-19 negative.  Patient was admitted under hospitalist service for further management.  Nephrology was consulted for dialysis.  Due to some bleeding at the AV fistula, vascular surgery was consulted and patient underwent fistula plication on Monday.  Patient then had positive blood culture reported on 09/16/2018 with staph epidermidis.  This was drawn on 09/13/2018.  Patient was started on vancomycin last night. Blood cultures repeated on 09/17/2018. The patient will require a 2 week course of vancomycin. Today is day 6/14. He should be able to get his vancomycin with dialysis upon discharge.  09/22/2018: Patient seen.  Nephrology input is appreciated.  Low blood pressure noted.  Patient was reported to be falling earlier.  Will consult PT OT.   Consultants  . Vascular surgery . Nephrology . Interventional radiology  Procedures  . 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 . Dialysis TTS . Paracentesis  Antibiotics   Anti-infectives (From admission, onward)   Start     Dose/Rate Route Frequency Ordered Stop   09/21/18 1200  vancomycin (VANCOCIN) IVPB 750 mg/150 ml premix     750 mg 150 mL/hr over 60 Minutes Intravenous Every T-Th-Sa (Hemodialysis) 09/21/18 0925     09/21/18 0922  vancomycin (VANCOCIN) 1-5 GM/200ML-% IVPB    Note to Pharmacy: Rodell Perna   : cabinet override      09/21/18 0922 09/21/18 1023   09/17/18 1200  vancomycin (VANCOCIN) IVPB 1000 mg/200 mL premix  Status:  Discontinued     1,000 mg 200 mL/hr over 60 Minutes Intravenous Every T-Th-Sa (Hemodialysis) 09/16/18 2047 09/21/18 0925   09/16/18 2100  vancomycin (VANCOCIN) 1,750 mg in sodium chloride 0.9 % 500 mL IVPB     1,750 mg 250 mL/hr over 120 Minutes Intravenous  Once 09/16/18 2047 09/17/18 0200   09/16/18 1030  ceFAZolin (ANCEF) IVPB 2g/100 mL premix     2 g 200 mL/hr over 30 Minutes Intravenous On call to O.R. 09/15/18 0849 09/17/18 0559     Subjective  Unstable on his feet Falling No fever or chills No chest pain  Objective   Vitals:  Vitals:   09/22/18 1122 09/22/18 1149  BP:    Pulse: 70 71  Resp: 12 13  Temp:    SpO2: 92% 100%    Exam:  Constitutional:  Patient is awake and alert. No acute distress. Respiratory:  . Clear to auscultation Cardiovascular:  . S1-S2 Abdomen:  Abdomen is soft, non-tender,  Neurologic:  . Awake and  alert.   . Patient moves all extremities.   I have personally reviewed the following:   Today's Data  . Vitals, BMP, CBC  Micro Data  . Blood cultures x 2 positive for staph epidermidis.  Scheduled Meds: . aspirin EC  81 mg Oral Daily  . calcium acetate  667 mg Oral TID WC  . Chlorhexidine Gluconate Cloth  6 each Topical Q0600  . cinacalcet  30 mg Oral Q supper  . doxercalciferol  2 mcg Intravenous Q T,Th,Sa-HD  . feeding supplement (PRO-STAT SUGAR FREE 64)  30 mL Oral BID  .  gabapentin  300 mg Oral QHS  . lidocaine (PF)      . metoprolol tartrate  25 mg Oral BID  . multivitamin  1 tablet Oral QHS  . pantoprazole  40 mg Oral Daily  . pneumococcal 23 valent vaccine  0.5 mL Intramuscular Tomorrow-1000  . rosuvastatin  20 mg Oral q1800  . sodium chloride flush  3 mL Intravenous Q12H   Continuous Infusions: . sodium chloride    . sodium chloride Stopped (09/17/18 0300)  . albumin human    . 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  For T wave inversion in lateral leads.  Troponin only slightly elevated around 39 but flat.  This is likely demand ischemia and not a sign of ACS.  Continue to monitor on telemetry.  Orthostatic hypotension: Hold antihypertensives. Blood pressures much improved this afternoon. 09/22/2018: Consult PT OT.  Disposition will depend on above.  Acute hypoxic respiratory failure secondary to acute pulmonary edema/acute on chronic combined systolic congestive heart failure: Lasix on hold.  He takes that as needed on off dialysis.  He has been on room air for last 2 days.  This is resolved.  Essential hypertension: Blood pressure stable and on low normal side and had orthostatic hypotension while working with PT today.  Continue metoprolol twice daily.  Apparently he takes Imdur and hydralazine as needed at home.  Continue PRN hydralazine.  No Imdur.  ESRD on HHS: Dialysis on TTS schedule.  Nephrology consulted.     Nephrology input is highly appreciated.  History of liver cirrhosis/recurrent ascites: Patient has presented to ED twice in last 1 month with almost similar symptoms.  He ended up having diagnostic paracentesis on 08/20/2018 which was unremarkable for any infection.  He returned back to ER on 09/11/2018 and ended up having another therapeutic paracentesis with removal of 3 L.  Underwent diagnostic and therapeutic paracentesis here on 09/14/2018 with removal of 1.8 L of yellow fluid.  Fluid analysis not indicative of any infection. He is reacumulating fluid and will need another paracentesis as he has become very uncomfortable in his abdomen. I have ordered it. He will get albumin with his procedure.  Chronic thrombocytopenia: Secondary to liver cirrhosis. Stable. No signs of bleeding. Avoid heparin products.  Generalized weakness/orthostatic hypotension: Resolved.   Gram-positive bacteremia: 2 of the 2 blood cultures growing staph  epidermidis which resulted last night and patient was started on vancomycin, pharmacy to consult and per pharmacy recommendations, he will need 14 days of Vancomycin. Today will make day 6/14. He should be able to get his remaining vancomycin with dialysis.  Repeated blood cultures on 09/17/2018. They have had no growth. 09/22/2018: Complete course of antibiotics.  DVT prophylaxis: SCD/avoiding heparin products due to thrombocytopenia Code Status: Full code Family Communication: None present Disposition Plan: Home with home health  Dana Allan MD [facility] Direct contact: see [email or web address]  7PM-7AM contact night coverage as above

## 2018-09-23 LAB — PH, BODY FLUID: pH, Body Fluid: 7.7

## 2018-09-23 LAB — PATHOLOGIST SMEAR REVIEW

## 2018-09-23 MED ORDER — CHLORHEXIDINE GLUCONATE CLOTH 2 % EX PADS: 6.0000 | MEDICATED_PAD | Freq: Every day | CUTANEOUS | Status: DC

## 2018-09-23 MED ORDER — PRO-STAT SUGAR FREE PO LIQD: 30.0000 mL | Freq: Two times a day (BID) | ORAL | 0 refills | Status: AC

## 2018-09-23 MED ORDER — VANCOMYCIN HCL IN DEXTROSE 750-5 MG/150ML-% IV SOLN: 750.0000 mg | INTRAVENOUS | 0 refills | Status: AC

## 2018-09-23 MED ORDER — PRO-STAT SUGAR FREE PO LIQD: 30.0000 mL | Freq: Two times a day (BID) | ORAL | 0 refills | Status: DC

## 2018-09-23 MED ORDER — DOXERCALCIFEROL 4 MCG/2ML IV SOLN: 2.0000 ug | INTRAVENOUS | 0 refills | Status: AC

## 2018-09-23 NOTE — Discharge Summary (Addendum)
Physician Discharge Summary  Patient ID: Eric [name] MRN: [id number] DOB/AGE: 1955/06/05 63 y.o.  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 Ellipse VR-CorVue-Merlin (DOI: 03/24/2013) in situ   Essential hypertension, benign   ESRD (end stage renal disease) ([place])   Acute on chronic combined systolic (congestive) and diastolic (congestive) heart failure (HCC)   Chest pain at rest   Discharged Condition: Patient refused skilled nursing facility placement  Hospital Course: Patient is a 63 year old African-American male with past medical history significant for congestive heart failure, coronary artery disease, hypertension, cardiac arrest, status post AICD placement, hepatitis C with liver cirrhosis, end-stage renal disease on hemodialysis on Tuesday, Thursday and Saturday.  Patient was admitted with shortness of breath and chest pressures.  Work-up done revealed pulmonary edema.  Patient was admitted for further assessment and management.  Hemodialysis was continued during the hospital stay.  During the hospital stay, patient was found to have ulcer with pseudoaneurysm of the AV fistula that was plicated.  Blood cultures done also grew Staphylococcus epidermidis.  Patient has been on vancomycin IV.  Patient will complete 2-week course of IV vancomycin.  Prior to discharge, physical therapy was consulted as the patient was falling and seemed unstable on his feet.  Physical therapy team recommended skilled nursing facility placement but patient and patient's wife have refused skilled nursing facility placement.  Patient be discharged home with home health PT  as per patient and patient's wife request and wishes.  There is a high chance patient will not be able to cope at home.  I would not be surprised if patient is readmitted with recurrent falls and failure to thrive at home.  Chest pain/shortness of breath:  Troponin remained flat.   Pulmonary edema was noted.   Symptoms improved with hemodialysis.   Patient will be discharged back to the care of primary care provider and nephrology team.    Orthostatic hypotension:  - Antihypertensives will minimize.   - Patient's blood pressure continues to be on the low normal side, guess, partly related to liver cirrhosis.   -Patient continues to be unstable on his feet. -Physical therapy team recommended skilled nursing facility placement, but patient and patient's wife have refused skilled nursing facility placement. -Patient will be discharged home with home health physical therapy.  There is a high chance this will fail.  Acute hypoxic respiratory failure secondary to acute pulmonary edema/acute on chronic combined systolic congestive heart failure: -This improved with hemodialysis.   Essential hypertension:  Several antihypertensives are on hold due to relative hypotension.     ESRD on hemodialysis TTS:  Hemodialysis was continued during the hospital stay.    History of liver cirrhosis/recurrent ascites:  Underwent paracentesis during the hospital stay.   Patient has history of recurrent paracentesis.    Chronic thrombocytopenia:  Secondary to liver cirrhosis.   Staphylococcus epidermidis bacteremia:  Patient will complete 2 weeks of IV vancomycin. Nephrology team will arrange IV vancomycin with hemodialysis on discharge.   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  Consults: nephrology and vascular surgery  Significant Diagnostic Studies: Blood culture grew Staphylococcus epidermidis  Treatments: Patient underwent hemodialysis  during the hospital stay, as well  as, plication of the pseudoaneurysm of the AV fistula.  Discharge Exam: Blood pressure 106/75, pulse 69, temperature 98.1 F (36.7 C), temperature source Oral, resp. rate 17, height 5\' 6"  (1.676 m), weight 79.6 kg, SpO2 94 %.   Disposition: Discharge disposition: 06-Home-Health Care Svc  Discharge Instructions    Diet - low sodium heart healthy   Complete by: As directed    Increase activity slowly   Complete by: As directed      Allergies as of 09/23/2018   No Known Allergies     Medication List    STOP taking these medications   amLODipine 10 MG tablet Commonly known as: NORVASC   furosemide 80 MG tablet Commonly known as: LASIX   isosorbide mononitrate 60 MG 24 hr tablet Commonly known as: IMDUR     TAKE these medications   acetaminophen 325 MG tablet Commonly known as: TYLENOL Take 2 tablets (650 mg total) by mouth every 6 (six) hours as needed for mild pain or moderate pain.   aspirin EC 81 MG tablet Take 81 mg by mouth daily.   calcium acetate 667 MG capsule Commonly known as: PHOSLO Take 667 mg by mouth 3 (three) times daily with meals.   cinacalcet 30 MG tablet Commonly known as: SENSIPAR Take 30 mg by mouth daily with supper.   doxercalciferol 4 MCG/2ML injection Commonly known as: HECTOROL Inject 1 mL (2 mcg total) into the vein Every Tuesday,Thursday,and Saturday with dialysis. Start taking on: September 24, 2018   feeding supplement (PRO-STAT SUGAR FREE 64) Liqd Take 30 mLs by mouth 2 (two) times daily.   gabapentin 300 MG capsule Commonly known as: Neurontin Take 1 capsule (300 mg total) by mouth 2 (two) times daily.   hydrALAZINE 10 MG tablet Commonly known as: APRESOLINE Take 10 mg by mouth daily as needed (anxiety).   metoprolol tartrate 25 MG tablet Commonly known as: LOPRESSOR Take 1 tablet (25 mg total) by mouth 2 (two) times daily.   multivitamin Tabs tablet Take 1 tablet by mouth at bedtime.    pantoprazole 40 MG tablet Commonly known as: PROTONIX Take 40 mg by mouth as needed (acid reflux).   rosuvastatin 20 MG tablet Commonly known as: CRESTOR Take 1 tablet (20 mg total) by mouth daily at 6 PM.   Vancomycin 750-5 MG/150ML-% Soln Commonly known as: VANCOCIN Inject 150 mLs (750 mg total) into the vein Every Tuesday,Thursday,and Saturday with dialysis for 8 days. Start taking on: September 24, 2018      Follow-up Information    Angelia Mould, MD Follow up in 2 week(s).   Specialties: Vascular Surgery, Cardiology Why: office will call Contact information: [place]  [id number] [phone]          Patient suffers from ESRD on HD, bacteremia and orthostatic hypotension which impairs their ability to perform daily activities like in the home.  Based on Physical therapy team recommendation - A walking aid will not resolve issue with performing activities of daily living. A wheelchair will allow patient to safely perform daily activities until symptoms resolve.   SignedBonnell Public 09/23/2018, 2:35 PM

## 2018-09-23 NOTE — Progress Notes (Signed)
Occupational Therapy Evaluation Patient Details Name: Eric [name] MRN: [id number] DOB: 1956-01-11 Today's Date: 09/23/2018    History of Present Illness Pt is a 63 y/o male admitted secondary to chest pain. Pt with CHF and likely demand ischemia. 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 PMH includes DM, hepatic cirrhosis, HTN, ESRD on HD TTS, s/p AICD.   Clinical Impression   PTA, pt was living at home with family, and reports he required assistance with ADL/IADL and was using RW for ambulation. Pt currently limited due to orthostatic BP (see below), RN aware. Pt requires minA-modA for UB and LB dressing. Pt appeared to not understand significance of BP despite education, pt continues to ask  "so am I going home today". Due to decline in current level of function, pt would benefit from acute OT to address established goals to facilitate safe D/C to venue listed below. At this time, recommend SNF follow-up. Will continue to follow acutely.  Orthostatic Blood Pressure:  Supine: 107/72 (81)  Sitting: 91/69 (77)  Standing: 75/56 (63) with reports of blurry vision  Return to sitting: 84/62 (70)  Return to supine: 106/75 (86)        Follow Up Recommendations  SNF    Equipment Recommendations  3 in 1 bedside commode    Recommendations for Other Services       Precautions / Restrictions Precautions Precautions: Fall;Other (comment) Precaution Comments: Check BP Restrictions Weight Bearing Restrictions: No      Mobility Bed Mobility Overal bed mobility: Needs Assistance Bed Mobility: Sit to Supine;Supine to Sit     Supine to sit: Min assist Sit to supine: Min assist      Transfers Overall transfer level: Needs assistance Equipment used: Rolling walker (2 wheeled) Transfers: Sit to/from Stand Sit to Stand: Min guard         General transfer comment: pt reports blurry vision with standing, required immediate return to sitting     Balance Overall balance assessment: Needs assistance Sitting-balance support: No upper extremity supported;Feet supported Sitting balance-Leahy Scale: Fair     Standing balance support: Bilateral upper extremity supported;During functional activity Standing balance-Leahy Scale: Poor Standing balance comment: Reliant on BUE support                            ADL either performed or assessed with clinical judgement   ADL Overall ADL's : Needs assistance/impaired Eating/Feeding: Set up;Sitting   Grooming: Set up;Sitting   Upper Body Bathing: Minimal assistance;Sitting   Lower Body Bathing: Moderate assistance;Sitting/lateral leans   Upper Body Dressing : Minimal assistance;Sitting   Lower Body Dressing: Moderate assistance;Sit to/from stand Lower Body Dressing Details (indicate cue type and reason): modA to access BLE minguard for sit<>stand               General ADL Comments: limited evaluation due to orthostatic BP     Vision Baseline Vision/History: Wears glasses Wears Glasses: Reading only       Perception     Praxis      Pertinent Vitals/Pain Pain Assessment: No/denies pain     Hand Dominance Right   Extremity/Trunk Assessment Upper Extremity Assessment Upper Extremity Assessment: Generalized weakness   Lower Extremity Assessment Lower Extremity Assessment: Generalized weakness   Cervical / Trunk Assessment Cervical / Trunk Assessment: Normal   Communication Communication Communication: No difficulties   Cognition Arousal/Alertness: Awake/alert Behavior During Therapy: Flat affect Overall Cognitive Status: Impaired/Different from baseline  Area of Impairment: Orientation;Attention;Memory;Following commands;Safety/judgement;Awareness;Problem solving                 Orientation Level: Disoriented to;Time;Situation Current Attention Level: Focused Memory: Decreased short-term memory Following Commands: Follows one step commands  inconsistently Safety/Judgement: Decreased awareness of safety;Decreased awareness of deficits Awareness: Intellectual Problem Solving: Slow processing;Requires verbal cues General Comments: educated pt on importance/significance of BP, pt did not appear to understand continues to ask if he is going home today   General Comments  continues to demonstrate orthostatic BP;educated pt but pt did not appear to understand significance of BP;HR 70s throughout session    Exercises     Shoulder Instructions      Home Living Family/patient expects to be discharged to:: Private residence Living Arrangements: Spouse/significant other;Children Available Help at Discharge: Family;Available 24 hours/day Type of Home: House Home Access: Stairs to enter [employer] of Steps: 3 Entrance Stairs-Rails: Right;Left Home Layout: One level     Bathroom Shower/Tub: Occupational psychologist: Standard     Home Equipment: Environmental consultant - 2 wheels;Cane - single point          Prior Functioning/Environment Level of Independence: Independent with assistive device(s);Needs assistance    ADL's / Homemaking Assistance Needed: reports family assists with ADL   Comments: Was using RW for ambulation         OT Problem List: Decreased activity tolerance;Impaired balance (sitting and/or standing);Decreased cognition;Decreased safety awareness;Decreased knowledge of use of DME or AE;Decreased knowledge of precautions;Cardiopulmonary status limiting activity      OT Treatment/Interventions: Self-care/ADL training;Therapeutic exercise;Energy conservation;DME and/or AE instruction;Therapeutic activities;Cognitive remediation/compensation;Patient/family education;Balance training    OT Goals(Current goals can be found in the care plan section) Acute Rehab OT Goals Patient Stated Goal: to go home OT Goal Formulation: With patient Time For Goal Achievement: 10/07/18 Potential to Achieve Goals:  Good ADL Goals Pt Will Perform Grooming: with modified independence;sitting;standing Pt Will Perform Upper Body Dressing: with modified independence;sitting Pt Will Perform Lower Body Dressing: with modified independence;sit to/from stand Pt Will Transfer to Toilet: with modified independence;ambulating Additional ADL Goal #1: Pt will demonstrate independence with 3 fall prevention strategies.  OT Frequency: Min 2X/week   Barriers to D/C: Decreased caregiver support          Co-evaluation              AM-PAC OT "6 Clicks" Daily Activity     Outcome Measure Help from another person eating meals?: A Little Help from another person taking care of personal grooming?: A Little Help from another person toileting, which includes using toliet, bedpan, or urinal?: A Lot Help from another person bathing (including washing, rinsing, drying)?: A Lot Help from another person to put on and taking off regular upper body clothing?: A Little Help from another person to put on and taking off regular lower body clothing?: A Lot 6 Click Score: 15   End of Session Equipment Utilized During Treatment: Gait belt;Rolling walker Nurse Communication: Mobility status(orthostatics)  Activity Tolerance: Treatment limited secondary to medical complications (Comment)(orthostatic BP) Patient left: in bed;with call bell/phone within reach;with bed alarm set  OT Visit Diagnosis: Unsteadiness on feet (R26.81);Other abnormalities of gait and mobility (R26.89);History of falling (Z91.81);Other symptoms and signs involving cognitive function                Time: 1306-1330 OT Time Calculation (min): 24 min Charges:  OT General Charges $OT Visit: 1 Visit OT Evaluation $OT Eval Moderate Complexity: 1 Mod OT  Treatments $Self Care/Home Management : 8-22 mins  Dorinda Hill OTR/L Acute Rehabilitation Services Office: [place] 09/23/2018, 2:26 PM

## 2018-09-23 NOTE — TOC Initial Note (Signed)
Transition of Care [facility]) - Initial/Assessment Note    Patient Details  Name: Eric [name] MRN: [id number] Date of Birth: [birth year]  Transition of Care [facility]) CM/SW Contact:    Eileen Stanford, LCSW Phone Number: 09/23/2018, 11:25 AM  Clinical Narrative:     CSW spoke with pt via telephone. Pt declines SNF. Pt declines HH. Pt hung up on CSW. Pt will d/c home with no services.             Expected Discharge Plan: Home/Self Care Barriers to Discharge: Continued Medical Work up   Patient Goals and CMS Choice Patient states their goals for this hospitalization and ongoing recovery are:: "to return home with damily support" CMS Medicare.gov Compare Post Acute Care list provided to:: Patient Choice offered to / list presented to : Patient  Expected Discharge Plan and Services Expected Discharge Plan: Home/Self Care In-house Referral: NA Discharge Planning Services: NA   Living arrangements for the past 2 months: Single Family Home                                      Prior Living Arrangements/Services Living arrangements for the past 2 months: Single Family Home Lives with:: Adult Children, Spouse Patient language and need for interpreter reviewed:: Yes Do you feel safe going back to the place where you live?: Yes      Need for Family Participation in Patient Care: Yes (Comment) Care giver support system in place?: Yes (comment) Current home services: DME(Pt states he has a wheelchair at home) Criminal Activity/Legal Involvement Pertinent to Current Situation/Hospitalization: No - Comment as needed  Activities of Daily Living Home Assistive Devices/Equipment: None ADL Screening (condition at time of admission) Patient's cognitive ability adequate to safely complete daily activities?: Yes Is the patient deaf or have difficulty hearing?: No Does the patient have difficulty seeing, even when wearing glasses/contacts?: No Does the patient have difficulty concentrating,  remembering, or making decisions?: No Patient able to express need for assistance with ADLs?: Yes Does the patient have difficulty dressing or bathing?: Yes Independently performs ADLs?: No Communication: Independent Dressing (OT): Needs assistance Is this a change from baseline?: Pre-admission baseline Grooming: Needs assistance Is this a change from baseline?: Pre-admission baseline Feeding: Independent Bathing: Needs assistance Is this a change from baseline?: Pre-admission baseline Toileting: Needs assistance Is this a change from baseline?: Pre-admission baseline In/Out Bed: Needs assistance Is this a change from baseline?: Pre-admission baseline Walks in Home: Needs assistance Is this a change from baseline?: Pre-admission baseline Does the patient have difficulty walking or climbing stairs?: Yes Weakness of Legs: None Weakness of Arms/Hands: None  Permission Sought/Granted Permission sought to share information with : Family Supports    Share Information with NAME: [name]     Permission granted to share info w Relationship: spouse     Emotional Assessment Appearance:: Appears stated age Attitude/Demeanor/Rapport: Unable to Assess Affect (typically observed): Unable to Assess Orientation: : Oriented to Self, Oriented to Place, Oriented to  Time, Oriented to Situation Alcohol / Substance Use: Not Applicable Psych Involvement: No (comment)  Admission diagnosis:  Acute pulmonary edema (HCC) [J81.0] Hypoxia [R09.02] Chest pain at rest [R07.9] Patient Active Problem List   Diagnosis Date Noted  . Acute on chronic combined systolic (congestive) and diastolic (congestive) heart failure ([place]) 09/13/2018  . Chest pain at rest 09/13/2018  . Atypical chest pain 07/30/2018  . S/P CABG (coronary artery bypass  graft) 04/01/2018  . Claudication ([place]) 04/01/2018  . Chronic diastolic (congestive) heart failure ([place]) 04/01/2018  . Paresthesia 12/07/2015  . BPH (benign  prostatic hyperplasia)   . Atherosclerosis of native coronary artery of native heart without angina pectoris 10/01/2015  . Chronic systolic (congestive) heart failure ([place]) 10/01/2015  . Encounter for fitting or adjustment of implantable cardioverter-defibrillator (ICD)   . Ileus, postoperative ([place])   . Impaired nasal gastric feeding tube   . Impaired nasogastric feeding tube   . Ileus ([place])   . ESRD (end stage renal disease) ([place])   . Gallstones   . Gallbladder calculus 06/15/2015  . Numbness   . RUQ abdominal pain   . CHF exacerbation ([place]) 03/10/2015  . Heart failure, acute on chronic, systolic and diastolic ([place]) 03/00/9233  . Acute on chronic congestive heart failure ([place])   . Diabetes mellitus with complication ([place])   . CHF (congestive heart failure) ([place]) 08/03/2014  . Hypertensive urgency 08/03/2014  . Controlled type 2 diabetes with neuropathy ([place]) 08/03/2014  . Obesity (BMI 30-39.9) 08/03/2014  . Bilateral leg pain 08/03/2014  . Acute on chronic combined systolic and diastolic CHF (congestive heart failure) ([place]) 07/02/2014  . DM (diabetes mellitus), type 2 with renal complications ([place]) 00/76/2263  . 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 Essential hypertension, benign 06/29/2014  . Hepatic cirrhosis ([place]) 11/28/2013  . Cardiomyopathy, ischemic 11/28/2013  . Hepatitis C antibody test positive 11/28/2013  . Cirrhosis ([place])   . Type 2 diabetes mellitus with hyperglycemia ([place])   . Diabetes mellitus with hypoglycemia ([place]) 04/29/2013  . HCAP (healthcare-associated pneumonia) 04/28/2013  . DM type 2 (diabetes mellitus, type 2) ([place]) 04/28/2013  . Hyperlipemia   . Ventricular fibrillation ([place]) 03/08/2013  . Seizure ([place]) 03/07/2013   PCP:  System, Pcp Not In Pharmacy:   [facility] [address] ([address]), [place] - [place] [address] [place] ([place]) [place] [address] Phone: [phone]  Fax: [phone]  [facility] [place], [place] - [address] [place] [address] Phone: [phone] Fax: [phone]     Social Determinants of Health (SDOH) Interventions    Readmission Risk Interventions Readmission Risk Prevention Plan 09/20/2018  Transportation Screening Complete  HRI or Home Care Consult Complete  Social Work Consult for [place] Planning/Counseling Complete  Medication Review Press photographer) Complete  Some recent data might be hidden

## 2018-09-23 NOTE — Progress Notes (Addendum)
Eric [name] Progress Note   Subjective:   Patient seen and examined at bedside.  Sitting up in bed.  No new complaints.  PT recommending SNF.   Objective Vitals:   09/22/18 2107 09/22/18 2158 09/23/18 0542 09/23/18 0817  BP: 104/74 92/68 109/79 126/82  Pulse: 70 70 70   Resp: 16  20   Temp: 98.6 F (37 C)  98.5 F (36.9 C)   TempSrc: Oral  Oral   SpO2: 97%  98%   Weight:   79.6 kg   Height:       Physical Exam General:NAD, well appearing male, laying in bed Heart:RRR Lungs:CTAB Abdomen:soft, NT, mildly distended Extremities:no LE edema Dialysis Access: RU AVF +b, dressed   Filed Weights   09/21/18 1123 09/22/18 0609 09/23/18 0542  Weight: 82.1 kg 83.4 kg 79.6 kg    Intake/Output Summary (Last 24 hours) at 09/23/2018 1309 Last data filed at 09/23/2018 0900 Gross per 24 hour  Intake 638 ml  Output -  Net 638 ml    Additional Objective Labs: Basic Metabolic Panel: Recent Labs  Lab 09/17/18 1625  09/19/18 0311 09/21/18 0342 09/22/18 0419  NA 130*   < > 130* 127* 131*  K 4.4   < > 5.0 4.5 4.7  CL 94*   < > 92* 90* 92*  CO2 25   < > 25 24 28   GLUCOSE 95   < > 69* 87 97  BUN 24*   < > 51* 49* 41*  CREATININE 4.27*   < > 7.26* 7.06* 6.44*  CALCIUM 7.5*   < > 7.7* 7.7* 8.0*  PHOS 2.6  --   --   --   --    < > = values in this interval not displayed.   Liver Function Tests: Recent Labs  Lab 09/17/18 1625 09/21/18 0342 09/22/18 0419  AST  --  17 21  ALT  --  8 9  ALKPHOS  --  66 72  BILITOT  --  0.9 0.7  PROT  --  7.0 6.9  ALBUMIN 2.8* 2.6* 2.5*   CBC: Recent Labs  Lab 09/17/18 1400 09/21/18 0342  WBC 6.5 4.9  NEUTROABS  --  3.3  HGB 9.6* 9.3*  HCT 30.9* 29.6*  MCV 102.7* 100.0  PLT 63* 61*   Blood Culture    Component Value Date/Time   SDES PERITONEAL 09/22/2018 0923   SDES PERITONEAL 09/22/2018 0923   SPECREQUEST NONE 09/22/2018 0923   SPECREQUEST NONE 09/22/2018 0923   CULT  09/22/2018 0923    NO GROWTH < 24  HOURS Performed at [facility], [place] [address], [place], [place] [address]    REPTSTATUS PENDING 09/22/2018 [phone]   REPTSTATUS 09/22/2018 FINAL 09/22/2018 0923   CBG: Recent Labs  Lab 09/17/18 0748 09/17/18 1631 09/17/18 2212 09/18/18 0649 09/18/18 0753  GLUCAP 74 80 82 85 87   Studies/Results: US Paracentesis  Result Date: 09/22/2018 INDICATION: Abdominal distention secondary to ascites. History of end-stage renal disease as well as cirrhosis. Request for diagnostic and therapeutic paracentesis. EXAM: ULTRASOUND GUIDED RIGHT LOWER QUADRANT PARACENTESIS MEDICATIONS: None. COMPLICATIONS: None immediate. PROCEDURE: Informed written consent was obtained from the patient after a discussion of the risks, benefits and alternatives to treatment. A timeout was performed prior to the initiation of the procedure. Initial ultrasound scanning demonstrates a large amount of ascites within the right lower abdominal quadrant. The right lower abdomen was prepped and draped in the usual sterile fashion. 1% lidocaine with  epinephrine was used for local anesthesia. Following this, a 19 gauge, 7-cm, Yueh catheter was introduced. An ultrasound image was saved for documentation purposes. The paracentesis was performed. The catheter was removed and a dressing was applied. The patient tolerated the procedure well without immediate post procedural complication. FINDINGS: A total of approximately 3.4 L of slightly hazy, amber colored fluid was removed. Samples were sent to the laboratory as requested by the clinical team. IMPRESSION: Successful ultrasound-guided paracentesis yielding 3.4 liters of peritoneal fluid. Read by: Ascencion Dike PA-C Electronically Signed   By: Jacqulynn Cadet M.D.   On: 09/22/2018 10:04    Medications: . sodium chloride    . sodium chloride Stopped (09/17/18 0300)  . albumin human    . vancomycin     . aspirin EC  81 mg Oral Daily  . calcium acetate  667 mg Oral TID WC  .  Chlorhexidine Gluconate Cloth  6 each Topical Q0600  . cinacalcet  30 mg Oral Q supper  . doxercalciferol  2 mcg Intravenous Q T,Th,Sa-HD  . feeding supplement (PRO-STAT SUGAR FREE 64)  30 mL Oral BID  . gabapentin  300 mg Oral QHS  . metoprolol tartrate  25 mg Oral BID  . multivitamin  1 tablet Oral QHS  . pantoprazole  40 mg Oral Daily  . pneumococcal 23 valent vaccine  0.5 mL Intramuscular Tomorrow-1000  . rosuvastatin  20 mg Oral q1800  . sodium chloride flush  3 mL Intravenous Q12H    Dialysis Orders: TTS at [facility] 3:30hr, 400/A1.5, EDW 81kg, 2K/2Ca, UFP #4, AVF, no heparin - Hectoral 98mcg IV q HD - Mircera 224mcg IV q 2 weeks (last 8/18)  Assessment/Plan: 1.Dyspnea/Pulmonary edema - extra HD on 8/21 for volume removal. Symptoms improved. 2. Ascites - Recurrent, multiple paracentesis - 7/28, 8/19, most recent 8/30 with 3.4L slightly hazy amber fluid removed. 3. Weakness/Instability - PT consulted.  Recommending SNF.    4. ESRD -on HD TTS. K 4.7. HD tomorrow per regular schedule.  5. Anemia of CKD-Hgb 9.3. ESA due 9/1, orders written. 6. Secondary hyperparathyroidism -CCa and phos in goal. Continue binders, sensipar, and VDRA for now. 7. HTN/volume -BP well controlled, metoprolol held. Continue to challenge EDW, likely will need lower dry on d/c. Weight today post para 79.6kg. Hard to determine EDW with recurrent ascites.  8. Nutrition -renal diet w/fluid restrictions. Alb low, continue protein supplements 9. Thrombocytopenia - chronic, No heparin w/HD 10. AVF ulceration - VVS following, s/p AVF revision/plication on 1/61 11. MRSE bacteremia: [facility] 8/21 +, started on Vanc on 8/24, will need 2 week course. Repeat [facility] 8/25 NGTD   Jen Mow, PA-C [name] Pager: [phone] 09/23/2018,1:09 PM  LOS: 10 days   Pt seen, examined and agree w A/P as above.  Kelly Splinter  MD 09/23/2018, 2:32 PM

## 2018-09-23 NOTE — Progress Notes (Signed)
Patient suffers from CHF,ESRD,Chronic Pain which impairs their ability to perform daily activities like walking in the home. A wheelchair will not resolve issue with performing activities of daily living. A wheelchair will allow patient to safely perform daily activities. Patient is not able to propel themselves in the home using a standard weight wheelchair due to weakness. Patient can self propel in the lightweight wheelchair. Length of need. Accessories: elevating leg rests, wheel locks, extensions and anti-tippers.   Dorinda Hill OTR/L Acute Rehabilitation Services Office: [phone]

## 2018-09-23 NOTE — TOC Initial Note (Addendum)
Transition of Care [facility]) - Initial/Assessment Note    Patient Details  Name: Eric [name] MRN: [id number] Date of Birth: 10-31-1955  Transition of Care [facility]) CM/SW Contact:    Eileen Stanford, LCSW Phone Number: 09/23/2018, 2:52 PM  Clinical Narrative:   Pt going to d/c home with home health. [name] spouse at bedside and agreeable. [facility]. Pt will be getting 3n1 and wheelchair, please await equipment before d/c pt. Pt has a PCP-- Pt goes to [facility].           Expected Discharge Plan: [place] Barriers to Discharge: Continued Medical Work up   Patient Goals and CMS Choice Patient states their goals for this hospitalization and ongoing recovery are:: "to return home with damily support" CMS Medicare.gov Compare Post Acute Care list provided to:: Patient Choice offered to / list presented to : Patient  Expected Discharge Plan and Services Expected Discharge Plan: [place] In-house Referral: NA Discharge Planning Services: NA Post Acute Care Choice: [place] arrangements for the past 2 months: Single Family Home Expected Discharge Date: 09/23/18               DME Arranged: 3-N-1 DME Agency: [facility] Date DME Agency Contacted: 09/23/18 Time DME Agency Contacted: [phone] Representative spoke with at DME Agency: [place]: PT [place]: [place] ([place]) Date [place]: 09/23/18 Time [place]: 65 Representative spoke with at [place]: Marcy Siren  Prior Living Arrangements/Services Living arrangements for the past 2 months: [place] with:: Adult Children, Spouse Patient language and need for interpreter reviewed:: Yes Do you feel safe going back to the place where you live?: Yes      Need for Family Participation in Patient Care: Yes (Comment) Care giver support system in place?: Yes (comment) Current home services: DME(Pt states he has a  wheelchair at home) Criminal Activity/Legal Involvement Pertinent to Current Situation/Hospitalization: No - Comment as needed  Activities of Daily Living Home Assistive Devices/Equipment: None ADL Screening (condition at time of admission) Patient's cognitive ability adequate to safely complete daily activities?: Yes Is the patient deaf or have difficulty hearing?: No Does the patient have difficulty seeing, even when wearing glasses/contacts?: No Does the patient have difficulty concentrating, remembering, or making decisions?: No Patient able to express need for assistance with ADLs?: Yes Does the patient have difficulty dressing or bathing?: Yes Independently performs ADLs?: No Communication: Independent Dressing (OT): Needs assistance Is this a change from baseline?: Pre-admission baseline Grooming: Needs assistance Is this a change from baseline?: Pre-admission baseline Feeding: Independent Bathing: Needs assistance Is this a change from baseline?: Pre-admission baseline Toileting: Needs assistance Is this a change from baseline?: Pre-admission baseline In/Out Bed: Needs assistance Is this a change from baseline?: Pre-admission baseline Walks in Home: Needs assistance Is this a change from baseline?: Pre-admission baseline Does the patient have difficulty walking or climbing stairs?: Yes Weakness of Legs: None Weakness of Arms/Hands: None  Permission Sought/Granted Permission sought to share information with : Family Supports    Share Information with NAME: [name]     Permission granted to share info w Relationship: spouse     Emotional Assessment Appearance:: Appears stated age Attitude/Demeanor/Rapport: Unable to Assess Affect (typically observed): Unable to Assess Orientation: : Oriented to Self, Oriented to Place, Oriented to  Time, Oriented to Situation Alcohol / Substance Use: Not Applicable Psych Involvement: No (comment)  Admission diagnosis:  Acute  pulmonary edema (HCC) [J81.0] Hypoxia [R09.02] Chest pain at rest [R07.9] Patient Active Problem List   Diagnosis Date Noted  . Acute on chronic combined systolic (congestive) and diastolic (congestive) heart failure ([place]) 09/13/2018  . Chest pain at rest 09/13/2018  . Atypical chest pain 07/30/2018  . S/P CABG (coronary artery bypass graft) 04/01/2018  . Claudication ([place]) 04/01/2018  . Chronic diastolic (congestive) heart failure ([place]) 04/01/2018  . Paresthesia 12/07/2015  . BPH (benign prostatic hyperplasia)   . Atherosclerosis of native coronary artery of native heart without angina pectoris 10/01/2015  . Chronic systolic (congestive) heart failure ([place]) 10/01/2015  . Encounter for fitting or adjustment of implantable cardioverter-defibrillator (ICD)   . Ileus, postoperative ([place])   . Impaired nasal gastric feeding tube   . Impaired nasogastric feeding tube   . Ileus ([place])   . ESRD (end stage renal disease) ([place])   . Gallstones   . Gallbladder calculus 06/15/2015  . Numbness   . RUQ abdominal pain   . CHF exacerbation ([place]) 03/10/2015  . Heart failure, acute on chronic, systolic and diastolic ([place]) 70/01/7492  . Acute on chronic congestive heart failure ([place])   . Diabetes mellitus with complication ([place])   . CHF (congestive heart failure) ([place]) 08/03/2014  . Hypertensive urgency 08/03/2014  . Controlled type 2 diabetes with neuropathy ([place]) 08/03/2014  . Obesity (BMI 30-39.9) 08/03/2014  . Bilateral leg pain 08/03/2014  . Acute on chronic combined systolic and diastolic CHF (congestive heart failure) ([place]) 07/02/2014  . DM (diabetes mellitus), type 2 with renal complications ([place]) 49/67/5916  . 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 Essential hypertension, benign 06/29/2014  . Hepatic cirrhosis ([place]) 11/28/2013  . Cardiomyopathy, ischemic 11/28/2013  . Hepatitis C antibody test positive 11/28/2013  . Cirrhosis  ([place])   . Type 2 diabetes mellitus with hyperglycemia ([place])   . Diabetes mellitus with hypoglycemia ([place]) 04/29/2013  . HCAP (healthcare-associated pneumonia) 04/28/2013  . DM type 2 (diabetes mellitus, type 2) ([place]) 04/28/2013  . Hyperlipemia   . Ventricular fibrillation ([place]) 03/08/2013  . Seizure ([place]) 03/07/2013   PCP:  System, Pcp Not In Pharmacy:   [facility] [address] ([address]), [place] - [place] [address] [place] ([place]) [place] [address] Phone: [phone] Fax: [phone]  [facility] [place], [place] - [address] [place] [address] Phone: [phone] Fax: [phone]     Social Determinants of Health (SDOH) Interventions    Readmission Risk Interventions Readmission Risk Prevention Plan 09/20/2018  Transportation Screening Complete  HRI or Home Care Consult Complete  Social Work Consult for [place] Planning/Counseling Complete  Medication Review Press photographer) Complete  Some recent data might be hidden

## 2018-09-25 ENCOUNTER — Encounter: Payer: Self-pay | Admitting: Cardiology

## 2018-09-27 ENCOUNTER — Telehealth: Payer: Self-pay

## 2018-09-27 LAB — CULTURE, BODY FLUID W GRAM STAIN -BOTTLE: Culture: NO GROWTH

## 2018-09-27 NOTE — Telephone Encounter (Signed)
[facility] called asking for Verbal orders for pt to have occupational and home health aide come to his house. The patient does not have a PCP

## 2018-09-27 NOTE — Telephone Encounter (Signed)
Lvm for nurse @ [facility] ok to proceed with home and occupational therapy

## 2018-09-27 NOTE — Telephone Encounter (Signed)
I am fine with this.

## 2018-10-02 ENCOUNTER — Ambulatory Visit (INDEPENDENT_AMBULATORY_CARE_PROVIDER_SITE_OTHER): Payer: Medicare Other | Admitting: Internal Medicine

## 2018-10-02 DIAGNOSIS — Z23 Encounter for immunization: Secondary | ICD-10-CM

## 2018-10-02 DIAGNOSIS — K7469 Other cirrhosis of liver: Secondary | ICD-10-CM

## 2018-10-02 DIAGNOSIS — B1920 Unspecified viral hepatitis C without hepatic coma: Secondary | ICD-10-CM

## 2018-10-09 ENCOUNTER — Other Ambulatory Visit: Payer: Self-pay

## 2018-10-09 ENCOUNTER — Ambulatory Visit (INDEPENDENT_AMBULATORY_CARE_PROVIDER_SITE_OTHER): Payer: Self-pay | Admitting: Vascular Surgery

## 2018-10-09 ENCOUNTER — Encounter: Payer: Self-pay | Admitting: Vascular Surgery

## 2018-10-09 VITALS — BP 115/75 | HR 69 | Temp 97.7°F | Resp 20 | Ht 66.0 in | Wt 175.8 lb

## 2018-10-09 DIAGNOSIS — N186 End stage renal disease: Secondary | ICD-10-CM

## 2018-10-09 DIAGNOSIS — Z992 Dependence on renal dialysis: Secondary | ICD-10-CM

## 2018-10-09 NOTE — Progress Notes (Signed)
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  HPI:   Eric [name] is a pleasant 63 y.o. 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  He comes in to have the sutures removed.  He has no specific complaints.  He states that they are using his fistula without any problems.  Current Outpatient Medications  Medication Sig Dispense Refill  . acetaminophen (TYLENOL) 325 MG tablet Take 2 tablets (650 mg total) by mouth every 6 (six) hours as needed for mild pain or moderate pain. 120 tablet 0  . Amino Acids-Protein Hydrolys (FEEDING SUPPLEMENT, PRO-STAT SUGAR FREE 64,) LIQD Take 30 mLs by mouth 2 (two) times daily. 887 mL 0  . aspirin EC 81 MG tablet Take 81 mg by mouth daily.     . calcium acetate (PHOSLO) 667 MG capsule Take 667 mg by mouth 3 (three) times daily with meals.     . cinacalcet (SENSIPAR) 30 MG tablet Take 30 mg by mouth daily with supper.    Marland Kitchen doxercalciferol (HECTOROL) 4 MCG/2ML injection Inject 1 mL (2 mcg total) into the vein Every Tuesday,Thursday,and Saturday with dialysis. 2 mL 0  . gabapentin (NEURONTIN) 300 MG capsule Take 1 capsule (300 mg total) by mouth 2 (two) times daily. 60 capsule 2  . hydrALAZINE (APRESOLINE) 10 MG tablet Take 10 mg by mouth daily as needed (anxiety).    . multivitamin (RENA-VIT) TABS tablet Take 1 tablet by mouth at bedtime. 30 tablet 0  . pantoprazole (PROTONIX) 40 MG tablet Take 40 mg by mouth as needed (acid reflux).     . rosuvastatin (CRESTOR) 20 MG tablet Take 1 tablet (20 mg total) by mouth daily at 6 PM. 30 tablet 0  . metoprolol tartrate (LOPRESSOR) 25 MG tablet Take 1 tablet (25 mg total) by mouth 2 (two) times daily. 180 tablet 3   No current facility-administered medications for this visit.     REVIEW OF SYSTEMS:  [X]  denotes positive finding, [ ]  denotes negative finding  Vascular    Leg swelling    Cardiac    Chest pain or chest pressure:    Shortness of breath upon exertion:    Short of breath when lying flat:    Irregular heart rhythm:    Constitutional    Fever or chills:     PHYSICAL EXAM:   Vitals:   10/09/18 0824  BP: 115/75  Pulse: 69  Resp: 20  Temp: 97.7 F (36.5 C)  SpO2: 98%  Weight: 175 lb 12.8 oz (79.7 kg)  Height: 5\' 6"  (1.676 m)    GENERAL: The patient is a well-nourished male, in no acute distress. The vital signs are documented above. CARDIOVASCULAR: There is a regular rate and rhythm. PULMONARY: There is good air exchange bilaterally without wheezing or rales. His fistula has a good thrill. His incision is healing nicely.  I removed his sutures in the office today. He does have a superficial wound below the area of plication and will need to keep a close eye on this.  DATA:   No new data  MEDICAL ISSUES:   STATUS POST PLICATION OF RIGHT UPPER ARM FISTULA: Patient is doing well status post plication of his right upper arm fistula.  This incision is healing nicely and we removed his sutures today.  He does have some ulceration below this and will  need to keep an eye on this.  I have arranged a follow-up visit in 4 weeks and he will be seen on the PA schedule at that time.  If this progresses we will need to consider addressing this also.  Deitra Mayo [facility] [phone]

## 2018-10-15 ENCOUNTER — Other Ambulatory Visit: Payer: Self-pay

## 2018-10-15 ENCOUNTER — Encounter (HOSPITAL_COMMUNITY): Payer: Self-pay | Admitting: Emergency Medicine

## 2018-10-15 ENCOUNTER — Emergency Department (HOSPITAL_COMMUNITY)
Admission: EM | Admit: 2018-10-15 | Discharge: 2018-10-15 | Disposition: A | Discharge status: Home / Self Care | Payer: Medicare Other | Attending: Emergency Medicine | Admitting: Emergency Medicine

## 2018-10-15 ENCOUNTER — Emergency Department (HOSPITAL_COMMUNITY): Payer: Medicare Other

## 2018-10-15 DIAGNOSIS — Z992 Dependence on renal dialysis: Secondary | ICD-10-CM | POA: Insufficient documentation

## 2018-10-15 DIAGNOSIS — I5022 Chronic systolic (congestive) heart failure: Secondary | ICD-10-CM | POA: Diagnosis not present

## 2018-10-15 DIAGNOSIS — Z87891 Personal history of nicotine dependence: Secondary | ICD-10-CM | POA: Insufficient documentation

## 2018-10-15 DIAGNOSIS — I132 Hypertensive heart and chronic kidney disease with heart failure and with stage 5 chronic kidney disease, or end stage renal disease: Secondary | ICD-10-CM | POA: Diagnosis not present

## 2018-10-15 DIAGNOSIS — R0602 Shortness of breath: Secondary | ICD-10-CM | POA: Diagnosis not present

## 2018-10-15 DIAGNOSIS — R079 Chest pain, unspecified: Secondary | ICD-10-CM | POA: Insufficient documentation

## 2018-10-15 DIAGNOSIS — E1140 Type 2 diabetes mellitus with diabetic neuropathy, unspecified: Secondary | ICD-10-CM | POA: Insufficient documentation

## 2018-10-15 DIAGNOSIS — W19XXXA Unspecified fall, initial encounter: Secondary | ICD-10-CM

## 2018-10-15 DIAGNOSIS — M792 Neuralgia and neuritis, unspecified: Secondary | ICD-10-CM | POA: Diagnosis not present

## 2018-10-15 DIAGNOSIS — M2550 Pain in unspecified joint: Secondary | ICD-10-CM | POA: Diagnosis present

## 2018-10-15 DIAGNOSIS — Z7982 Long term (current) use of aspirin: Secondary | ICD-10-CM | POA: Diagnosis not present

## 2018-10-15 DIAGNOSIS — N186 End stage renal disease: Secondary | ICD-10-CM | POA: Diagnosis not present

## 2018-10-15 DIAGNOSIS — Z79899 Other long term (current) drug therapy: Secondary | ICD-10-CM | POA: Insufficient documentation

## 2018-10-15 DIAGNOSIS — Z95810 Presence of automatic (implantable) cardiac defibrillator: Secondary | ICD-10-CM | POA: Diagnosis not present

## 2018-10-15 DIAGNOSIS — R52 Pain, unspecified: Secondary | ICD-10-CM

## 2018-10-15 DIAGNOSIS — E1122 Type 2 diabetes mellitus with diabetic chronic kidney disease: Secondary | ICD-10-CM | POA: Diagnosis not present

## 2018-10-15 LAB — CBC
HCT: 31.5 % — ABNORMAL LOW (ref 39.0–52.0)
Hemoglobin: 10.1 g/dL — ABNORMAL LOW (ref 13.0–17.0)
MCH: 32.4 pg (ref 26.0–34.0)
MCHC: 32.1 g/dL (ref 30.0–36.0)
MCV: 101 fL — ABNORMAL HIGH (ref 80.0–100.0)
Platelets: UNDETERMINED 10*3/uL (ref 150–400)
RBC: 3.12 MIL/uL — ABNORMAL LOW (ref 4.22–5.81)
RDW: 16.3 % — ABNORMAL HIGH (ref 11.5–15.5)
WBC: 4 10*3/uL (ref 4.0–10.5)
nRBC: 0 % (ref 0.0–0.2)

## 2018-10-15 LAB — BASIC METABOLIC PANEL
Anion gap: 12 (ref 5–15)
BUN: 27 mg/dL — ABNORMAL HIGH (ref 8–23)
CO2: 29 mmol/L (ref 22–32)
Calcium: 9 mg/dL (ref 8.9–10.3)
Chloride: 93 mmol/L — ABNORMAL LOW (ref 98–111)
Creatinine, Ser: 6.56 mg/dL — ABNORMAL HIGH (ref 0.61–1.24)
GFR calc Af Amer: 9 mL/min — ABNORMAL LOW (ref >60)
GFR calc non Af Amer: 8 mL/min — ABNORMAL LOW (ref >60)
Glucose, Bld: 91 mg/dL (ref 70–99)
Potassium: 3.6 mmol/L (ref 3.5–5.1)
Sodium: 134 mmol/L — ABNORMAL LOW (ref 135–145)

## 2018-10-15 LAB — TROPONIN I (HIGH SENSITIVITY): Troponin I (High Sensitivity): 32 ng/L — ABNORMAL HIGH (ref <18)

## 2018-10-15 MED ORDER — GABAPENTIN 300 MG PO CAPS
300.0000 mg | ORAL_CAPSULE | Freq: Once | ORAL | Status: AC
  Administered 2018-10-15: 16:00:00 300 mg via ORAL
  Filled 2018-10-15: qty 1

## 2018-10-15 MED ORDER — MORPHINE SULFATE (PF) 4 MG/ML IV SOLN
4.0000 mg | Freq: Once | INTRAVENOUS | Status: AC
  Administered 2018-10-15: 14:00:00 4 mg via INTRAMUSCULAR
  Filled 2018-10-15: qty 1

## 2018-10-15 MED ORDER — MORPHINE SULFATE (PF) 4 MG/ML IV SOLN: 4.0000 mg | Freq: Once | INTRAVENOUS | Status: DC

## 2018-10-15 MED ORDER — HYDROCODONE-ACETAMINOPHEN 5-325 MG PO TABS: 1.0000 | ORAL_TABLET | ORAL | Status: DC

## 2018-10-15 NOTE — ED Notes (Signed)
Called [facility] for pt transport 

## 2018-10-15 NOTE — ED Triage Notes (Signed)
Pt BIB [facility] from dialysis. Pt states that he fell on Sunday from his bed. Pt complaining of worsening pain all over. Pt almost finished dialysis treatment this morning. Pt has history of neuropathy. VSS. NAD.

## 2018-10-15 NOTE — Discharge Planning (Signed)
[facility] notes multiple admissions.  Spoke to pt at bedside, no additional needs identified.

## 2018-10-15 NOTE — ED Notes (Signed)
EDP made aware of multiple attempts to draw 2nd troponin.

## 2018-10-15 NOTE — Discharge Instructions (Signed)
Continue your current medications, follow-up with your primary care doctor.  Return as needed for worsening symptoms

## 2018-10-15 NOTE — ED Provider Notes (Addendum)
[facility] EMERGENCY DEPARTMENT Provider Note   CSN: [id number] Arrival date & time: 10/15/18  1047     History   Chief Complaint Chief Complaint  Patient presents with  . Fall    HPI Eric [name] is a 63 y.o. male.     HPI Patient presents the emergency room with complaints of pain all over.  Patient was trying to get out of bed left evening when he fell and landed on the ground.  Patient states he started having pain all over.  Family were eventually able to help him up.  Patient went to dialysis today.  At dialysis patient started complaining of pain all over.  He was sent to the ED for further evaluation.  Patient states he is having pain in his chest and he feels short of breath.  He denies any fevers or chills or cough.  Patient denies any headache or head injury.  No abdominal pain, vomiting or diarrhea.  When asked what specifically is hurting whether it is his shoulders hips hips or his back, patient states that everything hurts. Past Medical History:  Diagnosis Date  . AICD (automatic cardioverter/defibrillator) present   . Cardiac arrest ([place]) 03/07/2013  . Cardiac arrest due to underlying cardiac condition ([place])   . CHF (congestive heart failure) ([place])   . CKD (chronic kidney disease), stage III ([place])   . Complication of anesthesia    got too much anesthesia and bp dropped very low Feb, 2019  . Edema   . Encounter for fitting or adjustment of implantable cardioverter-defibrillator (ICD)   . ESRD on dialysis ([place])   . History of blood transfusion 1950's   "related to pinal menigitis; HAD 16 OPERATIONS TOTAL"  . History of ventricular fibrillation 03/23/2013  . Hyperlipemia   . Hypertension   . Ileus ([place]) 05/2015  . Myocardial infarction ([place]) 03/2013  . Pain in the chest   . Pneumonia 2016  . Rash and nonspecific skin eruption 06/29/2014  . Scabies infestation 06/29/2014  . Shortness of breath dyspnea   . Type II diabetes mellitus ([place])    type 2  . Ventricular tachycardia [facility])     Patient Active Problem List   Diagnosis Date Noted  . Atypical chest pain 07/30/2018  . S/P CABG (coronary artery bypass graft) 04/01/2018  . Claudication ([place]) 04/01/2018  . Chronic diastolic (congestive) heart failure ([place]) 04/01/2018  . Paresthesia 12/07/2015  . BPH (benign prostatic hyperplasia)   . Atherosclerosis of native coronary artery of native heart without angina pectoris 10/01/2015  . Chronic systolic (congestive) heart failure ([place]) 10/01/2015  . Encounter for fitting or adjustment of implantable cardioverter-defibrillator (ICD)   . Ileus, postoperative ([place])   . Impaired nasal gastric feeding tube   . Impaired nasogastric feeding tube   . Ileus ([place])   . ESRD (end stage renal disease) ([place])   . Gallstones   . Gallbladder calculus 06/15/2015  . Numbness   . RUQ abdominal pain   . Diabetes mellitus with complication ([place])   . Hypertensive urgency 08/03/2014  . Controlled type 2 diabetes with neuropathy ([place]) 08/03/2014  . Obesity (BMI 30-39.9) 08/03/2014  . Bilateral leg pain 08/03/2014  . DM (diabetes mellitus), type 2 with renal complications ([place]) 40/10/2723  . Essential hypertension, benign 06/29/2014  . Hepatic cirrhosis ([place]) 11/28/2013  . Hepatitis C antibody test positive 11/28/2013  . Cirrhosis ([place])   . Type 2 diabetes mellitus with hyperglycemia ([place])   . Diabetes mellitus with hypoglycemia (  [place]) 04/29/2013  . Hyperlipemia   . ICD - Single chamber ICD-St.Jude 1411-36C Ellipse VR-CorVue-Merlin (DOI: 03/24/2013) in situ 03/24/2013  . History of ventricular fibrillation 03/23/2013  . Ventricular fibrillation ([place]) 03/08/2013  . Seizure ([place]) 03/07/2013    Past Surgical History:  Procedure Laterality Date  . AV FISTULA PLACEMENT Right 03/18/2015   Procedure: BRACHIOCEPHALIC ARTERIOVENOUS (AV) FISTULA CREATION;  Surgeon: Angelia Mould, MD;  Location: [facility];  Service: Vascular;  Laterality: Right;  .  BACK SURGERY  1950's   "for spinal menigitis; HAD 16 OPERATIONS TOTAL"  . CARDIAC CATHETERIZATION    . CHOLECYSTECTOMY N/A 06/17/2015   Procedure: LAPAROSCOPIC CHOLECYSTECTOMY;  Surgeon: Coralie Keens, MD;  Location: [facility];  Service: General;  Laterality: N/A;  . CORONARY ARTERY BYPASS GRAFT  1999   cabg x4  . 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 HEAD SURGERY  1950'S   "for spinal menigitis; HAD 16 OPERATIONS TOTAL"  . IMPLANTABLE CARDIOVERTER DEFIBRILLATOR IMPLANT  03/24/2013   STJ single chamber ICD implanted by Dr Caryl Comes for secondary prevention  . IMPLANTABLE CARDIOVERTER DEFIBRILLATOR IMPLANT N/A 03/24/2013   Procedure: IMPLANTABLE CARDIOVERTER DEFIBRILLATOR IMPLANT;  Surgeon: Deboraha Sprang, MD;  Location: [facility] CATH LAB;  Service: Cardiovascular;  Laterality: N/A;  . INSERTION OF DIALYSIS CATHETER N/A 03/13/2015   Procedure: INSERTION OF TUNNELED DIALYSIS CATHETER;  Surgeon: Conrad Dixmoor, MD;  Location: [facility];  Service: Vascular;  Laterality: N/A;  . IR PARACENTESIS  08/20/2018  . IR PARACENTESIS  09/11/2018  . LEFT HEART CATHETERIZATION WITH CORONARY/GRAFT ANGIOGRAM  03/07/2013   Procedure: LEFT HEART CATHETERIZATION WITH [name];  Surgeon: Clent Demark, MD;  Location: [facility] CATH LAB;  Service: Cardiovascular;;  . REVISON OF ARTERIOVENOUS FISTULA Right 01/03/2018   Procedure: REVISION OF ARTERIOVENOUS FISTULA;  Surgeon: Waynetta Sandy, MD;  Location: [facility];  Service: Vascular;  Laterality: Right;  . REVISON OF ARTERIOVENOUS FISTULA Right 09/16/2018   Procedure: REVISION PLICATION OF ARTERIOVENOUS FISTULA RIGHT ARM;  Surgeon: Angelia Mould, MD;  Location: [facility];  Service: Vascular;  Laterality: Right;  . UMBILICAL HERNIA REPAIR N/A 06/24/2015   Procedure: HERNIA REPAIR UMBILICAL ADULT;  Surgeon: Georganna Skeans, MD;  Location: [facility];  Service: General;  Laterality: N/A;        Home Medications    Prior to  Admission medications   Medication Sig Start Date End Date Taking? Authorizing Provider  acetaminophen (TYLENOL) 325 MG tablet Take 2 tablets (650 mg total) by mouth every 6 (six) hours as needed for mild pain or moderate pain. 07/01/15  Yes Smiley Houseman, MD  Amino Acids-Protein Hydrolys (FEEDING SUPPLEMENT, PRO-STAT SUGAR FREE 64,) LIQD Take 30 mLs by mouth 2 (two) times daily. 09/23/18  Yes Dana Allan I, MD  aspirin EC 81 MG tablet Take 81 mg by mouth daily.    Yes [provider]  calcium acetate (PHOSLO) 667 MG capsule Take 1,334 mg by mouth 3 (three) times daily with meals.    Yes [provider]  cinacalcet (SENSIPAR) 30 MG tablet Take 30 mg by mouth daily with supper.   Yes [provider]  gabapentin (NEURONTIN) 300 MG capsule Take 1 capsule (300 mg total) by mouth 2 (two) times daily. Patient taking differently: Take 300 mg by mouth 2 (two) times daily as needed (pain).  04/01/18  Yes Miquel Dunn, NP  hydrALAZINE (APRESOLINE) 10 MG tablet Take 10 mg by mouth daily as needed (  anxiety).   Yes [provider]  metoprolol tartrate (LOPRESSOR) 25 MG tablet Take 1 tablet (25 mg total) by mouth 2 (two) times daily. 05/29/18 10/15/18 Yes Adrian Prows, MD  multivitamin (RENA-VIT) TABS tablet Take 1 tablet by mouth at bedtime. 07/01/15  Yes Smiley Houseman, MD  pantoprazole (PROTONIX) 40 MG tablet Take 40 mg by mouth as needed (acid reflux).    Yes [provider]  rosuvastatin (CRESTOR) 20 MG tablet Take 1 tablet (20 mg total) by mouth daily at 6 PM. 01/05/18  Yes Kayleen Memos, DO  doxercalciferol (HECTOROL) 4 MCG/2ML injection Inject 1 mL (2 mcg total) into the vein Every Tuesday,Thursday,and Saturday with dialysis. 09/24/18   Bonnell Public, MD    Family History Family History  Problem Relation Age of Onset  . Heart disease Mother   . Diabetes Mother     Social History Social History   Tobacco Use  . Smoking status:  Former Smoker    Packs/day: 1.00    Years: 5.00    Pack years: 5.00    Types: Cigarettes  . Smokeless tobacco: Never Used  Substance Use Topics  . Alcohol use: No    Alcohol/week: 0.0 standard drinks  . Drug use: No     Allergies   Patient has no known allergies.   Review of Systems Review of Systems  All other systems reviewed and are negative.    Physical Exam Updated Vital Signs BP 125/77   Pulse 70   Temp 98.3 F (36.8 C) (Oral)   Resp 17   Ht 1.676 m (5\' 6" )   Wt 85.7 kg   SpO2 96%   BMI 30.51 kg/m   Physical Exam Vitals signs and nursing note reviewed.  Constitutional:      Appearance: He is well-developed. He is not toxic-appearing or diaphoretic.  HENT:     Head: Normocephalic and atraumatic.     Right Ear: External ear normal.     Left Ear: External ear normal.  Eyes:     General: No scleral icterus.       Right eye: No discharge.        Left eye: No discharge.     Conjunctiva/sclera: Conjunctivae normal.  Neck:     Musculoskeletal: Neck supple.     Trachea: No tracheal deviation.  Cardiovascular:     Rate and Rhythm: Normal rate and regular rhythm.  Pulmonary:     Effort: Pulmonary effort is normal. No respiratory distress.     Breath sounds: Normal breath sounds. No stridor. No wheezing or rales.  Abdominal:     General: Bowel sounds are normal. There is no distension.     Palpations: Abdomen is soft.     Tenderness: There is no abdominal tenderness. There is no guarding or rebound.  Musculoskeletal:        General: No tenderness.     Right shoulder: He exhibits normal range of motion and no deformity.     Left shoulder: He exhibits normal range of motion and no deformity.     Right wrist: He exhibits normal range of motion and no swelling.     Left wrist: He exhibits normal range of motion and no swelling.     Right hip: He exhibits normal range of motion and no swelling.     Left hip: He exhibits normal range of motion and no swelling.      Right knee: He exhibits no swelling and no deformity.  Left knee: He exhibits no swelling and no deformity.     Right ankle: He exhibits no swelling and no deformity.     Left ankle: He exhibits no swelling and no deformity.     Comments: Patient is diffusely tender throughout entire body even to very light palpation or movement of any joint, there are no focal areas of edema  Skin:    General: Skin is warm and dry.     Findings: No rash.  Neurological:     Mental Status: He is alert.     Cranial Nerves: No cranial nerve deficit (no facial droop, extraocular movements intact, no slurred speech).     Sensory: No sensory deficit.     Motor: No abnormal muscle tone or seizure activity.     Coordination: Coordination normal.     Comments: Patient is able to sit up in bed without assistance, he is able to move all extremities,      ED Treatments / Results  Labs (all labs ordered are listed, but only abnormal results are displayed) Labs Reviewed  CBC - Abnormal; Notable for the following components:      Result Value   RBC 3.12 (*)    Hemoglobin 10.1 (*)    HCT 31.5 (*)    MCV 101.0 (*)    RDW 16.3 (*)    All other components within normal limits  BASIC METABOLIC PANEL - Abnormal; Notable for the following components:   Sodium 134 (*)    Chloride 93 (*)    BUN 27 (*)    Creatinine, Ser 6.56 (*)    GFR calc non Af Amer 8 (*)    GFR calc Af Amer 9 (*)    All other components within normal limits  TROPONIN I (HIGH SENSITIVITY) - Abnormal; Notable for the following components:   Troponin I (High Sensitivity) 32 (*)    All other components within normal limits    EKG EKG Interpretation  Date/Time:  Tuesday October 15 2018 10:57:25 EDT Ventricular Rate:  70 PR Interval:    QRS Duration: 180 QT Interval:  494 QTC Calculation: 534 R Axis:   -84 Text Interpretation:  Electronic ventricular pacemaker No significant change since last tracing Confirmed by Dorie Rank  [phone]) on 10/15/2018 11:52:14 AM   Radiology Dg Chest 1 View  Result Date: 10/15/2018 CLINICAL DATA:  Fall, pain in hips. EXAM: CHEST  1 VIEW COMPARISON:  September 12, 2018 FINDINGS: Heart remains enlarged with signs of median sternotomy and CABG. Pack projects over the left chest for single lead pacer defibrillator unchanged. Lung volumes remain stable with mild thickening of the minor fissure in the right chest and vascular crowding. No signs of effusion or consolidation. IMPRESSION: Similar appearance of the chest when compared to the study of 09/12/2018, perhaps with improved vascular crowding and/or congestion compared to the prior study. No sign of consolidation. Electronically Signed   By: Zetta Bills M.D.   On: 10/15/2018 13:07   Dg Hips Bilat W Or Wo Pelvis 2 Views  Result Date: 10/15/2018 CLINICAL DATA:  Fall, bilateral hip pain. EXAM: DG HIP (WITH OR WITHOUT PELVIS) 2V BILAT COMPARISON:  CT abdomen and pelvis of August 20, 2018 FINDINGS: Degenerative changes are noted in the lumbar spine. Hips are located. No signs of fracture of the bony pelvis. AP and lateral views of the bilateral hips without signs of fracture. Incidental note is made numerous gas-filled loops of bowel in the low abdomen, nonspecific finding. Vascular disease.  IMPRESSION: No signs of acute fracture of the pelvis or bilateral hips. Nonspecific mild bowel distension and gas-filled loops in the low abdomen. Correlate with any clinical signs of ileus. Electronically Signed   By: Zetta Bills M.D.   On: 10/15/2018 13:03    Procedures Procedures (including critical care time)  Medications Ordered in ED Medications  morphine 4 MG/ML injection 4 mg (4 mg Intramuscular Given 10/15/18 1415)     Initial Impression / Assessment and Plan / ED Course  I have reviewed the triage vital signs and the nursing notes.  Pertinent labs & imaging results that were available during my care of the patient were reviewed by me and  considered in my medical decision making (see chart for details).  Clinical Course as of Oct 15 1522  Tue Oct 15, 2018  1320 Electrolyte panel similar to previous.   [JK]  1320 Troponin is elevated but similar to previous values.   [JK]  1320 Chest x-ray and hip films without significant abnormalities   [JK]  1348 Discussed the results with the patient.  Explained to him no signs of any fracture or serious injury.  Labs are reassuring.  I do not feel he needs to be admitted.  Patient is upset.  He feels he should be admitted because of his diffuse pain.  I explained that we will be certainly giving him something for pain here.   [JK]  1520 Delta troponin attempted but unsuccessful on repeat blood draw.  Patient's troponin level is similar to previous   [JK]    Clinical Course User Index [JK] Dorie Rank, MD     Patient presented to the ED for evaluation of pain after a fall today.  He does have a history of multiple medical problems as well as neuropathy.  Patient has had issues with falls in the past.  During his last hospitalization nursing facility placement was recommended but the patient wanted to remain home with the family.  Patient was concerned about his persistent pain but I do not see any evidence of serious injury.  He is not showing any signs of infection.  Patient has been monitored for several hours and has remained hemodynamically stable.  I do not see any indication for hospitalization at this time and I think he can be safely discharged with close outpatient follow-up.  Final Clinical Impressions(s) / ED Diagnoses   Final diagnoses:  Fall, initial encounter  Neuralgia    ED Discharge Orders    None       Dorie Rank, MD 10/15/18 1523    Dorie Rank, MD 10/15/18 1524

## 2018-10-18 ENCOUNTER — Ambulatory Visit: Payer: Medicare Other | Admitting: Cardiology

## 2018-10-30 ENCOUNTER — Telehealth: Payer: Self-pay | Admitting: Pharmacy Technician

## 2018-10-30 ENCOUNTER — Encounter: Payer: Self-pay | Admitting: Infectious Diseases

## 2018-10-30 ENCOUNTER — Ambulatory Visit (INDEPENDENT_AMBULATORY_CARE_PROVIDER_SITE_OTHER): Payer: Medicare Other | Admitting: Infectious Diseases

## 2018-10-30 ENCOUNTER — Other Ambulatory Visit: Payer: Self-pay

## 2018-10-30 VITALS — BP 115/72 | HR 69 | Temp 98.0°F

## 2018-10-30 DIAGNOSIS — N186 End stage renal disease: Secondary | ICD-10-CM

## 2018-10-30 DIAGNOSIS — D696 Thrombocytopenia, unspecified: Secondary | ICD-10-CM

## 2018-10-30 DIAGNOSIS — K7460 Unspecified cirrhosis of liver: Secondary | ICD-10-CM

## 2018-10-30 DIAGNOSIS — B182 Chronic viral hepatitis C: Secondary | ICD-10-CM

## 2018-10-30 NOTE — Patient Instructions (Signed)
Limit salt in diet. Return to clinic in 4 weeks to review FibroScan results. Please call us with name and phone number of the nephrologist (kidney doctor) that you see at dialysis regularly.

## 2018-10-30 NOTE — Telephone Encounter (Signed)
[facility] Patient Advocate Encounter    Findings of the benefits investigation conducted this morning via test claims for the patient's upcoming appointment on 10/07 are as follows:   Insurance: [place] (active) other coverage noted by medicaid to be medicare but unable to find billing information  Prior Authorization: will begin insurance process once medication is prescribed  Will need to find out from the patient their billing information and introduce them to pharmacy services at [facility].  Bartholomew Crews, CPhT Specialty Pharmacy Patient [facility] Phone: [phone] Fax: [phone] 10/30/2018 9:29 AM

## 2018-10-30 NOTE — Progress Notes (Signed)
Eric [name]  [id number]  [birth year]    HPI: The patient is a 63 y.o. y/o AA male who presents today for an evaluation for HCV.  His hepatitis C was initially diagnosed in 2016 when he was confirmed to have genotype 2b and HCV viremia of 877,570 IU.  He had a negative HIV antibody earlier this year.  He denies having any tattoos placed and denies any history of illicit drugs either inhaled or injected.  He also does not recall having any prior blood transfusions in the past.  He has been married for the past 45 years.  His current wife has not been hepatitis C tested to her knowledge.  He began hemodialysis approximately 3 years ago in 2018 due to progressive chronic renal insufficiency that led to end-stage renal disease.  Over the past 2 years the patient's health has been in progressive decline as in addition to his end-stage renal disease requiring dialysis he also has developed significant CHF and had an episode of ventricular tachycardia last year that required placement of an ICD.  He also has been diagnosed with cirrhosis and over the past 6 months has required paracenteses approximately once monthly.  Approximately 18 months ago, the patient and his wife relocated from the [place], [place] area to [place] to be closer to family while receiving medical care.  He denies any prior treatment with DAA medications for his chronic hepatitis C.  He was referred by his gastroenterologist for an evaluation of treatment options.  Of note, the patient is not on a transplant list for his kidney nor his liver, and he has not been recently assessed at a transplant center or transplant surgeon.  He does complain of recurrent and chronic abdominal distention/swelling.  His wife admits he is occasionally forgetful but otherwise has maintained a reasonable appetite and has attended his dialysis sessions on schedule, never missing any dialysis treatments.   Past Medical History:  Diagnosis Date   AICD  (automatic cardioverter/defibrillator) present    Cardiac arrest ([place]) 03/07/2013   Cardiac arrest due to underlying cardiac condition [facility])    CHF (congestive heart failure) ([place])    Complication of anesthesia    got too much anesthesia and bp dropped very low Feb, 2019   Edema    Encounter for fitting or adjustment of implantable cardioverter-defibrillator (ICD)    ESRD on dialysis ([place])    began HD in 2018   History of blood transfusion 1950's   "related to pinal menigitis; HAD 16 OPERATIONS TOTAL"   History of ventricular fibrillation 03/23/2013   Hyperlipemia    Hypertension    Ileus ([place]) 05/2015   Myocardial infarction ([place]) 03/2013   Pain in the chest    Pneumonia 2016   Rash and nonspecific skin eruption 06/29/2014   Scabies infestation 06/29/2014   Shortness of breath dyspnea    Type II diabetes mellitus ([place])    type 2   Ventricular tachycardia ([place])     Past Surgical History:  Procedure Laterality Date   AV FISTULA PLACEMENT Right 03/18/2015   Procedure: BRACHIOCEPHALIC ARTERIOVENOUS (AV) FISTULA CREATION;  Surgeon: Angelia Mould, MD;  Location: [facility];  Service: Vascular;  Laterality: Right;   BACK SURGERY  1950's   "for spinal menigitis; HAD 16 OPERATIONS TOTAL"   CARDIAC CATHETERIZATION     CHOLECYSTECTOMY N/A 06/17/2015   Procedure: LAPAROSCOPIC CHOLECYSTECTOMY;  Surgeon: Coralie Keens, MD;  Location: 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"   HEAD SURGERY  1950'S   "for spinal menigitis; HAD 16 OPERATIONS TOTAL"   IMPLANTABLE CARDIOVERTER DEFIBRILLATOR IMPLANT  03/24/2013   STJ single chamber ICD implanted by Dr Caryl Comes for secondary prevention   IMPLANTABLE CARDIOVERTER DEFIBRILLATOR IMPLANT N/A 03/24/2013   Procedure: IMPLANTABLE CARDIOVERTER DEFIBRILLATOR IMPLANT;  Surgeon: Deboraha Sprang, MD;  Location: [facility]  CATH LAB;  Service: Cardiovascular;  Laterality: N/A;   INSERTION OF DIALYSIS CATHETER N/A 03/13/2015   Procedure: INSERTION OF TUNNELED DIALYSIS CATHETER;  Surgeon: Conrad Sussex, MD;  Location: [facility] OR;  Service: Vascular;  Laterality: N/A;   IR PARACENTESIS  08/20/2018   IR PARACENTESIS  09/11/2018   LEFT HEART CATHETERIZATION WITH CORONARY/GRAFT ANGIOGRAM  03/07/2013   Procedure: LEFT HEART CATHETERIZATION WITH CORONARY/GRAFT ANGIOGRAM;  Surgeon: Clent Demark, MD;  Location: [facility] CATH LAB;  Service: Cardiovascular;;   REVISON OF ARTERIOVENOUS FISTULA Right 01/03/2018   Procedure: REVISION OF ARTERIOVENOUS FISTULA;  Surgeon: Waynetta Sandy, MD;  Location: [facility];  Service: Vascular;  Laterality: Right;   REVISON OF ARTERIOVENOUS FISTULA Right 09/16/2018   Procedure: REVISION PLICATION OF ARTERIOVENOUS FISTULA RIGHT ARM;  Surgeon: Angelia Mould, MD;  Location: [facility];  Service: Vascular;  Laterality: Right;   UMBILICAL HERNIA REPAIR N/A 06/24/2015   Procedure: HERNIA REPAIR UMBILICAL ADULT;  Surgeon: Georganna Skeans, MD;  Location: [facility] OR;  Service: General;  Laterality: N/A;     Family History  Problem Relation Age of Onset   Heart disease Mother    Diabetes Mother      Social History   Tobacco Use   Smoking status: Former Smoker    Packs/day: 1.00    Years: 5.00    Pack years: 5.00    Types: Cigarettes   Smokeless tobacco: Never Used  Substance Use Topics   Alcohol use: No    Alcohol/week: 0.0 standard drinks   Drug use: No      reports previously being sexually active.   No Known Allergies   Outpatient Medications Prior to Visit  Medication Sig Dispense Refill   acetaminophen (TYLENOL) 325 MG tablet Take 2 tablets (650 mg total) by mouth every 6 (six) hours as needed for mild pain or moderate pain. 120 tablet 0   Amino Acids-Protein Hydrolys (FEEDING SUPPLEMENT, PRO-STAT SUGAR FREE 64,) LIQD Take 30 mLs by mouth 2 (two) times daily. 887 mL 0    aspirin EC 81 MG tablet Take 81 mg by mouth daily.      calcium acetate (PHOSLO) 667 MG capsule Take 1,334 mg by mouth 3 (three) times daily with meals.      cinacalcet (SENSIPAR) 30 MG tablet Take 30 mg by mouth daily with supper.     doxercalciferol (HECTOROL) 4 MCG/2ML injection Inject 1 mL (2 mcg total) into the vein Every Tuesday,Thursday,and Saturday with dialysis. 2 mL 0   gabapentin (NEURONTIN) 300 MG capsule Take 1 capsule (300 mg total) by mouth 2 (two) times daily. (Patient taking differently: Take 300 mg by mouth 2 (two) times daily as needed (pain). ) 60 capsule 2   hydrALAZINE (APRESOLINE) 10 MG tablet Take 10 mg by mouth daily as needed (anxiety).     multivitamin (RENA-VIT) TABS tablet Take 1 tablet by mouth at bedtime. 30 tablet 0   pantoprazole (PROTONIX) 40 MG tablet Take 40 mg by mouth as needed (acid reflux).  rosuvastatin (CRESTOR) 20 MG tablet Take 1 tablet (20 mg total) by mouth daily at 6 PM. 30 tablet 0   metoprolol tartrate (LOPRESSOR) 25 MG tablet Take 1 tablet (25 mg total) by mouth 2 (two) times daily. 180 tablet 3   No facility-administered medications prior to visit.      Review of Systems  Constitutional: Positive for fatigue. Negative for chills and fever.  HENT: Negative for congestion, hearing loss, rhinorrhea and sinus pressure.   Eyes: Negative for photophobia, pain, redness and visual disturbance.  Respiratory: Negative for apnea, cough, shortness of breath and wheezing.   Cardiovascular: Positive for leg swelling. Negative for chest pain and palpitations.  Gastrointestinal: Positive for abdominal distention and diarrhea. Negative for abdominal pain, constipation, nausea and vomiting.  Endocrine: Negative for cold intolerance, heat intolerance, polydipsia and polyuria.  Genitourinary: Positive for decreased urine volume. Negative for dysuria, frequency, hematuria and testicular pain.  Musculoskeletal: Negative for back pain, myalgias and neck  pain.  Skin: Negative for pallor and rash.  Allergic/Immunologic: Negative for immunocompromised state.  Neurological: Negative for dizziness, seizures, syncope, speech difficulty and light-headedness.  Hematological: Does not bruise/bleed easily.  Psychiatric/Behavioral: Positive for confusion. Negative for agitation and hallucinations. The patient is not nervous/anxious.        Intermittently     Vitals:   10/30/18 1003  BP: 115/72  Pulse: 69  Temp: 98 F (36.7 C)     Physical Exam Gen: chronically ill, appears older than stated age, NAD, A&Ox 3 Head: NCAT, no temporal wasting evident EENT: PERRL, EOMI, MMM, adequate dentition Neck: supple, no JVD CV: NRRR, no murmurs evident Pulm: CTA bilaterally, no wheeze or retractions Abd: NT, diffusely distended/firm with +ascites, +BS Extrems: 2+ pitting LE edema, 1+ pulses Skin: no rashes, poor skin turgor Neuro: CN II-XII grossly intact, no focal neurologic deficits appreciated, gait was unable to be assessed as pt presenting in wheelchair today, A&Ox 3   Labs: Lab Results  Component Value Date   HEPBSAG Negative 09/13/2018    No results found for: [facility]  No results found for: [facility]  Lab Results  Component Value Date   HCVGENOTYPE 2b 07/02/2014    Lab Results  Component Value Date   WBC 4.0 10/15/2018   HGB 10.1 (L) 10/15/2018   HCT 31.5 (L) 10/15/2018   MCV 101.0 (H) 10/15/2018   PLT PLATELET CLUMPS NOTED ON SMEAR, UNABLE TO ESTIMATE 10/15/2018       Chemistry      Component Value Date/Time   NA 134 (L) 10/15/2018 1155   K 3.6 10/15/2018 1155   CL 93 (L) 10/15/2018 1155   CO2 29 10/15/2018 1155   BUN 27 (H) 10/15/2018 1155   CREATININE 6.56 (H) 10/15/2018 1155      Component Value Date/Time   CALCIUM 9.0 10/15/2018 1155   ALKPHOS 72 09/22/2018 0419   AST 21 09/22/2018 0419   ALT 9 09/22/2018 0419   BILITOT 0.7 09/22/2018 0419        Assessment/Plan: The patient is a 63 year old  African-American male with end-stage renal disease on hemodialysis, CHF and h/o VT, s/p ICD placement, and cirrhosis with recurrent ascites presenting for an evaluation for chronic hepatitis C.  HCV - HCV Ab was positive initially in 2016, thus confirming past exposure to pathogen. Note: this screening test will remain positive/reactive life-long even if HCV infection has been cured/immunologically cleared. It is unclear how the patient may have or when he acquired his chronic HCV infection as he  lacks most traditional risk factors per his report. Will repeat HCV viral load as it has been 4 years since last assessed. A recent HIV Ab was negative, thus excluding co-infection. As chronic infection has been confirmed, will proceed with FibroScan to determine/confirm stage of fibrosis and true cirrhosis as cause of recent ascites. Due to his genotype, his only initial DAA options would be either epclusa +/- ribavirin and mavyret. F/u in 6 weeks to review imaging results.   Probable decompensated cirrhosis -the patient's requirement of paracenteses monthly for the last several months is quite concerning for progression of his cirrhosis to decompensation.  All current available DAA options for treatment of genotype 2b do carry extreme warnings against use and decompensated cirrhosis due to all cause mortality risks.  Given the patient's recognize CHF/ischemic cardiomyopathy and end-stage renal disease, he does have 2 other plausible reasons to have developed ascites aside from true cirrhosis.  For this reason we will obtain a FibroScan to assess the scarring for his liver to ensure native hepatic disease rather than ascites from portal hypertension/congestive hepatopathy.  If negative hepatic disease is confirmed, I am uncertain the patient may be safely treated for his hepatitis C at our center as he instead may be better served at a transplant center if this is to be pursued further.  I briefly discussed this  concern with the patient and his wife at today's visit.  At the present time, the patient does not know who his managing nephrologist is.  As he does attend dialysis 3 times a week I have asked the patient's wife to call us back in the next several days so that we may have the name of his nephrologist who I can speak to directly regarding his medical condition.  I am uncertain if they will have strategies to lower his risk for further hepatic decompensation that may be able to be ameliorated with various dialysis strategies.  I also would like to discuss the possibility of a Ribavarin-containing regimen as this addition frequently causes hemolytic anemia and may place the patient at risk for complications of treatment.  Ideally, I would also like to discuss his care with his current GI physician and his cardiologist before decisions are made regarding initiation of DAA treatment.  We will follow-up the patient's FibroScan and review these parameters either prior to or at his next visit.  Health maintenance - I have counselled the patient extensively re: the need for barrier precautions with sexual activity in order to prevent sexual of HCV.  I have encouraged the patient's wife to be tested for HCV as well.  He must continue these practices until 3 months after treatment completion until a sustained virologic response (SVR) has been confirmed to establish a cure of her infection. He expressed full understanding of these instructions. Vaccination for hepatitis A & B was initiated on 10/02/2018. Abstinence from alcohol was recommended in total.

## 2018-10-31 ENCOUNTER — Encounter: Payer: Self-pay | Admitting: Infectious Diseases

## 2018-11-02 ENCOUNTER — Inpatient Hospital Stay (HOSPITAL_COMMUNITY): Payer: Medicare Other

## 2018-11-02 ENCOUNTER — Emergency Department (HOSPITAL_COMMUNITY): Payer: Medicare Other

## 2018-11-02 ENCOUNTER — Other Ambulatory Visit: Payer: Self-pay

## 2018-11-02 ENCOUNTER — Inpatient Hospital Stay (HOSPITAL_COMMUNITY)
Admission: EM | Admit: 2018-11-02 | Discharge: 2018-11-24 | DRG: 308 | Disposition: E | Payer: Medicare Other | Attending: Internal Medicine | Admitting: Internal Medicine

## 2018-11-02 DIAGNOSIS — E11649 Type 2 diabetes mellitus with hypoglycemia without coma: Secondary | ICD-10-CM | POA: Diagnosis not present

## 2018-11-02 DIAGNOSIS — R188 Other ascites: Secondary | ICD-10-CM | POA: Diagnosis present

## 2018-11-02 DIAGNOSIS — Z8674 Personal history of sudden cardiac arrest: Secondary | ICD-10-CM

## 2018-11-02 DIAGNOSIS — I252 Old myocardial infarction: Secondary | ICD-10-CM | POA: Diagnosis not present

## 2018-11-02 DIAGNOSIS — I132 Hypertensive heart and chronic kidney disease with heart failure and with stage 5 chronic kidney disease, or end stage renal disease: Secondary | ICD-10-CM | POA: Diagnosis present

## 2018-11-02 DIAGNOSIS — I25118 Atherosclerotic heart disease of native coronary artery with other forms of angina pectoris: Secondary | ICD-10-CM | POA: Diagnosis not present

## 2018-11-02 DIAGNOSIS — K652 Spontaneous bacterial peritonitis: Secondary | ICD-10-CM | POA: Diagnosis not present

## 2018-11-02 DIAGNOSIS — K921 Melena: Secondary | ICD-10-CM

## 2018-11-02 DIAGNOSIS — I5032 Chronic diastolic (congestive) heart failure: Secondary | ICD-10-CM | POA: Diagnosis present

## 2018-11-02 DIAGNOSIS — K7460 Unspecified cirrhosis of liver: Secondary | ICD-10-CM | POA: Diagnosis not present

## 2018-11-02 DIAGNOSIS — G9341 Metabolic encephalopathy: Secondary | ICD-10-CM | POA: Diagnosis present

## 2018-11-02 DIAGNOSIS — I4901 Ventricular fibrillation: Principal | ICD-10-CM | POA: Diagnosis present

## 2018-11-02 DIAGNOSIS — Z20828 Contact with and (suspected) exposure to other viral communicable diseases: Secondary | ICD-10-CM | POA: Diagnosis present

## 2018-11-02 DIAGNOSIS — Z7982 Long term (current) use of aspirin: Secondary | ICD-10-CM

## 2018-11-02 DIAGNOSIS — D696 Thrombocytopenia, unspecified: Secondary | ICD-10-CM | POA: Diagnosis present

## 2018-11-02 DIAGNOSIS — I462 Cardiac arrest due to underlying cardiac condition: Secondary | ICD-10-CM | POA: Diagnosis not present

## 2018-11-02 DIAGNOSIS — K922 Gastrointestinal hemorrhage, unspecified: Secondary | ICD-10-CM

## 2018-11-02 DIAGNOSIS — Z978 Presence of other specified devices: Secondary | ICD-10-CM

## 2018-11-02 DIAGNOSIS — E873 Alkalosis: Secondary | ICD-10-CM | POA: Diagnosis not present

## 2018-11-02 DIAGNOSIS — K559 Vascular disorder of intestine, unspecified: Secondary | ICD-10-CM | POA: Diagnosis not present

## 2018-11-02 DIAGNOSIS — R079 Chest pain, unspecified: Secondary | ICD-10-CM | POA: Diagnosis present

## 2018-11-02 DIAGNOSIS — K766 Portal hypertension: Secondary | ICD-10-CM | POA: Diagnosis present

## 2018-11-02 DIAGNOSIS — Z4502 Encounter for adjustment and management of automatic implantable cardiac defibrillator: Secondary | ICD-10-CM | POA: Diagnosis not present

## 2018-11-02 DIAGNOSIS — Z4659 Encounter for fitting and adjustment of other gastrointestinal appliance and device: Secondary | ICD-10-CM

## 2018-11-02 DIAGNOSIS — Z951 Presence of aortocoronary bypass graft: Secondary | ICD-10-CM

## 2018-11-02 DIAGNOSIS — E871 Hypo-osmolality and hyponatremia: Secondary | ICD-10-CM | POA: Diagnosis present

## 2018-11-02 DIAGNOSIS — Z8249 Family history of ischemic heart disease and other diseases of the circulatory system: Secondary | ICD-10-CM

## 2018-11-02 DIAGNOSIS — I361 Nonrheumatic tricuspid (valve) insufficiency: Secondary | ICD-10-CM

## 2018-11-02 DIAGNOSIS — Z87891 Personal history of nicotine dependence: Secondary | ICD-10-CM

## 2018-11-02 DIAGNOSIS — I469 Cardiac arrest, cause unspecified: Secondary | ICD-10-CM | POA: Diagnosis not present

## 2018-11-02 DIAGNOSIS — G931 Anoxic brain damage, not elsewhere classified: Secondary | ICD-10-CM | POA: Diagnosis present

## 2018-11-02 DIAGNOSIS — N400 Enlarged prostate without lower urinary tract symptoms: Secondary | ICD-10-CM | POA: Diagnosis present

## 2018-11-02 DIAGNOSIS — Z95810 Presence of automatic (implantable) cardiac defibrillator: Secondary | ICD-10-CM

## 2018-11-02 DIAGNOSIS — M898X9 Other specified disorders of bone, unspecified site: Secondary | ICD-10-CM | POA: Diagnosis present

## 2018-11-02 DIAGNOSIS — E1122 Type 2 diabetes mellitus with diabetic chronic kidney disease: Secondary | ICD-10-CM | POA: Diagnosis present

## 2018-11-02 DIAGNOSIS — Z9911 Dependence on respirator [ventilator] status: Secondary | ICD-10-CM | POA: Diagnosis not present

## 2018-11-02 DIAGNOSIS — N186 End stage renal disease: Secondary | ICD-10-CM | POA: Diagnosis present

## 2018-11-02 DIAGNOSIS — Z9289 Personal history of other medical treatment: Secondary | ICD-10-CM

## 2018-11-02 DIAGNOSIS — B1920 Unspecified viral hepatitis C without hepatic coma: Secondary | ICD-10-CM | POA: Diagnosis not present

## 2018-11-02 DIAGNOSIS — Z833 Family history of diabetes mellitus: Secondary | ICD-10-CM

## 2018-11-02 DIAGNOSIS — R092 Respiratory arrest: Secondary | ICD-10-CM

## 2018-11-02 DIAGNOSIS — I340 Nonrheumatic mitral (valve) insufficiency: Secondary | ICD-10-CM | POA: Diagnosis not present

## 2018-11-02 DIAGNOSIS — E876 Hypokalemia: Secondary | ICD-10-CM | POA: Diagnosis not present

## 2018-11-02 DIAGNOSIS — Z781 Physical restraint status: Secondary | ICD-10-CM

## 2018-11-02 DIAGNOSIS — J9601 Acute respiratory failure with hypoxia: Secondary | ICD-10-CM | POA: Diagnosis present

## 2018-11-02 DIAGNOSIS — E785 Hyperlipidemia, unspecified: Secondary | ICD-10-CM | POA: Diagnosis present

## 2018-11-02 DIAGNOSIS — K7469 Other cirrhosis of liver: Secondary | ICD-10-CM

## 2018-11-02 DIAGNOSIS — D689 Coagulation defect, unspecified: Secondary | ICD-10-CM | POA: Diagnosis not present

## 2018-11-02 DIAGNOSIS — Z0189 Encounter for other specified special examinations: Secondary | ICD-10-CM

## 2018-11-02 DIAGNOSIS — E669 Obesity, unspecified: Secondary | ICD-10-CM | POA: Diagnosis present

## 2018-11-02 DIAGNOSIS — J9690 Respiratory failure, unspecified, unspecified whether with hypoxia or hypercapnia: Secondary | ICD-10-CM

## 2018-11-02 DIAGNOSIS — E440 Moderate protein-calorie malnutrition: Secondary | ICD-10-CM | POA: Insufficient documentation

## 2018-11-02 DIAGNOSIS — R69 Illness, unspecified: Secondary | ICD-10-CM

## 2018-11-02 DIAGNOSIS — Z992 Dependence on renal dialysis: Secondary | ICD-10-CM | POA: Diagnosis not present

## 2018-11-02 DIAGNOSIS — E1140 Type 2 diabetes mellitus with diabetic neuropathy, unspecified: Secondary | ICD-10-CM | POA: Diagnosis present

## 2018-11-02 DIAGNOSIS — R579 Shock, unspecified: Secondary | ICD-10-CM | POA: Diagnosis present

## 2018-11-02 DIAGNOSIS — Z6831 Body mass index (BMI) 31.0-31.9, adult: Secondary | ICD-10-CM

## 2018-11-02 DIAGNOSIS — D631 Anemia in chronic kidney disease: Secondary | ICD-10-CM | POA: Diagnosis present

## 2018-11-02 DIAGNOSIS — Z452 Encounter for adjustment and management of vascular access device: Secondary | ICD-10-CM | POA: Diagnosis not present

## 2018-11-02 HISTORY — DX: Unspecified viral hepatitis C without hepatic coma: B19.20

## 2018-11-02 LAB — POCT I-STAT 7, (LYTES, BLD GAS, ICA,H+H)
Acid-Base Excess: 3 mmol/L — ABNORMAL HIGH (ref 0.0–2.0)
Acid-Base Excess: 3 mmol/L — ABNORMAL HIGH (ref 0.0–2.0)
Acid-Base Excess: 3 mmol/L — ABNORMAL HIGH (ref 0.0–2.0)
Bicarbonate: 25 mmol/L (ref 20.0–28.0)
Bicarbonate: 23.8 mmol/L (ref 20.0–28.0)
Bicarbonate: 24.2 mmol/L (ref 20.0–28.0)
Calcium, Ion: 1.14 mmol/L — ABNORMAL LOW (ref 1.15–1.40)
Calcium, Ion: 1.12 mmol/L — ABNORMAL LOW (ref 1.15–1.40)
Calcium, Ion: 1.09 mmol/L — ABNORMAL LOW (ref 1.15–1.40)
HCT: 30 % — ABNORMAL LOW (ref 39.0–52.0)
HCT: 37 % — ABNORMAL LOW (ref 39.0–52.0)
HCT: 36 % — ABNORMAL LOW (ref 39.0–52.0)
Hemoglobin: 10.2 g/dL — ABNORMAL LOW (ref 13.0–17.0)
Hemoglobin: 12.6 g/dL — ABNORMAL LOW (ref 13.0–17.0)
Hemoglobin: 12.2 g/dL — ABNORMAL LOW (ref 13.0–17.0)
O2 Saturation: 100 %
O2 Saturation: 98 %
O2 Saturation: 98 %
Patient temperature: 98.6
Patient temperature: 95.2
Patient temperature: 35.2
Potassium: 3.1 mmol/L — ABNORMAL LOW (ref 3.5–5.1)
Potassium: 3.6 mmol/L (ref 3.5–5.1)
Potassium: 3.5 mmol/L (ref 3.5–5.1)
Sodium: 131 mmol/L — ABNORMAL LOW (ref 135–145)
Sodium: 133 mmol/L — ABNORMAL LOW (ref 135–145)
Sodium: 130 mmol/L — ABNORMAL LOW (ref 135–145)
TCO2: 26 mmol/L (ref 22–32)
TCO2: 25 mmol/L (ref 22–32)
TCO2: 25 mmol/L (ref 22–32)
pCO2 arterial: 28.8 mmHg — ABNORMAL LOW (ref 32.0–48.0)
pCO2 arterial: 23.5 mmHg — ABNORMAL LOW (ref 32.0–48.0)
pCO2 arterial: 23.8 mmHg — ABNORMAL LOW (ref 32.0–48.0)
pH, Arterial: 7.547 — ABNORMAL HIGH (ref 7.350–7.450)
pH, Arterial: 7.607 (ref 7.350–7.450)
pH, Arterial: 7.61 (ref 7.350–7.450)
pO2, Arterial: 173 mmHg — ABNORMAL HIGH (ref 83.0–108.0)
pO2, Arterial: 73 mmHg — ABNORMAL LOW (ref 83.0–108.0)
pO2, Arterial: 73 mmHg — ABNORMAL LOW (ref 83.0–108.0)

## 2018-11-02 LAB — I-STAT CHEM 8, ED
BUN: 43 mg/dL — ABNORMAL HIGH (ref 8–23)
Calcium, Ion: 1.1 mmol/L — ABNORMAL LOW (ref 1.15–1.40)
Chloride: 92 mmol/L — ABNORMAL LOW (ref 98–111)
Creatinine, Ser: 7.2 mg/dL — ABNORMAL HIGH (ref 0.61–1.24)
Glucose, Bld: 89 mg/dL (ref 70–99)
HCT: 37 % — ABNORMAL LOW (ref 39.0–52.0)
Hemoglobin: 12.6 g/dL — ABNORMAL LOW (ref 13.0–17.0)
Potassium: 3.5 mmol/L (ref 3.5–5.1)
Sodium: 134 mmol/L — ABNORMAL LOW (ref 135–145)
TCO2: 30 mmol/L (ref 22–32)

## 2018-11-02 LAB — CBC
HCT: 30.9 % — ABNORMAL LOW (ref 39.0–52.0)
Hemoglobin: 10.1 g/dL — ABNORMAL LOW (ref 13.0–17.0)
MCH: 31.3 pg (ref 26.0–34.0)
MCHC: 32.7 g/dL (ref 30.0–36.0)
MCV: 95.7 fL (ref 80.0–100.0)
Platelets: 63 10*3/uL — ABNORMAL LOW (ref 150–400)
RBC: 3.23 MIL/uL — ABNORMAL LOW (ref 4.22–5.81)
RDW: 16 % — ABNORMAL HIGH (ref 11.5–15.5)
WBC: 5.1 10*3/uL (ref 4.0–10.5)
nRBC: 0 % (ref 0.0–0.2)

## 2018-11-02 LAB — COMPREHENSIVE METABOLIC PANEL
ALT: 15 U/L (ref 0–44)
AST: 31 U/L (ref 15–41)
Albumin: 2.8 g/dL — ABNORMAL LOW (ref 3.5–5.0)
Alkaline Phosphatase: 52 U/L (ref 38–126)
Anion gap: 16 — ABNORMAL HIGH (ref 5–15)
BUN: 37 mg/dL — ABNORMAL HIGH (ref 8–23)
CO2: 23 mmol/L (ref 22–32)
Calcium: 8.7 mg/dL — ABNORMAL LOW (ref 8.9–10.3)
Chloride: 92 mmol/L — ABNORMAL LOW (ref 98–111)
Creatinine, Ser: 7.24 mg/dL — ABNORMAL HIGH (ref 0.61–1.24)
GFR calc Af Amer: 8 mL/min — ABNORMAL LOW (ref >60)
GFR calc non Af Amer: 7 mL/min — ABNORMAL LOW (ref >60)
Glucose, Bld: 128 mg/dL — ABNORMAL HIGH (ref 70–99)
Potassium: 4.1 mmol/L (ref 3.5–5.1)
Sodium: 131 mmol/L — ABNORMAL LOW (ref 135–145)
Total Bilirubin: 1 mg/dL (ref 0.3–1.2)
Total Protein: 7.4 g/dL (ref 6.5–8.1)

## 2018-11-02 LAB — CBC WITH DIFFERENTIAL/PLATELET
Abs Immature Granulocytes: 0.03 10*3/uL (ref 0.00–0.07)
Basophils Absolute: 0 10*3/uL (ref 0.0–0.1)
Basophils Relative: 0 %
Eosinophils Absolute: 0 10*3/uL (ref 0.0–0.5)
Eosinophils Relative: 0 %
HCT: 31.6 % — ABNORMAL LOW (ref 39.0–52.0)
Hemoglobin: 9.6 g/dL — ABNORMAL LOW (ref 13.0–17.0)
Immature Granulocytes: 0 %
Lymphocytes Relative: 40 %
Lymphs Abs: 2.7 10*3/uL (ref 0.7–4.0)
MCH: 31.5 pg (ref 26.0–34.0)
MCHC: 30.4 g/dL (ref 30.0–36.0)
MCV: 103.6 fL — ABNORMAL HIGH (ref 80.0–100.0)
Monocytes Absolute: 0.6 10*3/uL (ref 0.1–1.0)
Monocytes Relative: 10 %
Neutro Abs: 3.4 10*3/uL (ref 1.7–7.7)
Neutrophils Relative %: 50 %
Platelets: 58 10*3/uL — ABNORMAL LOW (ref 150–400)
RBC: 3.05 MIL/uL — ABNORMAL LOW (ref 4.22–5.81)
RDW: 16.6 % — ABNORMAL HIGH (ref 11.5–15.5)
WBC: 6.8 10*3/uL (ref 4.0–10.5)
nRBC: 0 % (ref 0.0–0.2)

## 2018-11-02 LAB — LACTIC ACID, PLASMA
Lactic Acid, Venous: 4.4 mmol/L (ref 0.5–1.9)
Lactic Acid, Venous: 2.3 mmol/L (ref 0.5–1.9)
Lactic Acid, Venous: 1.9 mmol/L (ref 0.5–1.9)

## 2018-11-02 LAB — APTT: aPTT: 33 seconds (ref 24–36)

## 2018-11-02 LAB — GLUCOSE, CAPILLARY
Glucose-Capillary: 167 mg/dL — ABNORMAL HIGH (ref 70–99)
Glucose-Capillary: 115 mg/dL — ABNORMAL HIGH (ref 70–99)
Glucose-Capillary: 123 mg/dL — ABNORMAL HIGH (ref 70–99)

## 2018-11-02 LAB — PROTIME-INR
INR: 1.8 — ABNORMAL HIGH (ref 0.8–1.2)
INR: 1.9 — ABNORMAL HIGH (ref 0.8–1.2)
Prothrombin Time: 20.8 seconds — ABNORMAL HIGH (ref 11.4–15.2)
Prothrombin Time: 21.7 seconds — ABNORMAL HIGH (ref 11.4–15.2)

## 2018-11-02 LAB — BASIC METABOLIC PANEL
Anion gap: 19 — ABNORMAL HIGH (ref 5–15)
BUN: 42 mg/dL — ABNORMAL HIGH (ref 8–23)
CO2: 21 mmol/L — ABNORMAL LOW (ref 22–32)
Calcium: 9.1 mg/dL (ref 8.9–10.3)
Chloride: 92 mmol/L — ABNORMAL LOW (ref 98–111)
Creatinine, Ser: 7.63 mg/dL — ABNORMAL HIGH (ref 0.61–1.24)
GFR calc Af Amer: 8 mL/min — ABNORMAL LOW (ref >60)
GFR calc non Af Amer: 7 mL/min — ABNORMAL LOW (ref >60)
Glucose, Bld: 160 mg/dL — ABNORMAL HIGH (ref 70–99)
Potassium: 3.8 mmol/L (ref 3.5–5.1)
Sodium: 132 mmol/L — ABNORMAL LOW (ref 135–145)

## 2018-11-02 LAB — CK: Total CK: 116 U/L (ref 49–397)

## 2018-11-02 LAB — TROPONIN I (HIGH SENSITIVITY)
Troponin I (High Sensitivity): 40 ng/L — ABNORMAL HIGH (ref <18)
Troponin I (High Sensitivity): 135 ng/L (ref <18)
Troponin I (High Sensitivity): 1488 ng/L (ref <18)
Troponin I (High Sensitivity): 3323 ng/L (ref <18)

## 2018-11-02 LAB — CORTISOL: Cortisol, Plasma: 30.2 ug/dL

## 2018-11-02 LAB — SARS CORONAVIRUS 2 BY RT PCR (HOSPITAL ORDER, PERFORMED IN ~~LOC~~ HOSPITAL LAB): SARS Coronavirus 2: NEGATIVE

## 2018-11-02 LAB — AMYLASE: Amylase: 132 U/L — ABNORMAL HIGH (ref 28–100)

## 2018-11-02 LAB — PHOSPHORUS: Phosphorus: 5 mg/dL — ABNORMAL HIGH (ref 2.5–4.6)

## 2018-11-02 LAB — ECHOCARDIOGRAM COMPLETE
Height: 66 in
Weight: 3024.01 oz

## 2018-11-02 LAB — MAGNESIUM: Magnesium: 2.2 mg/dL (ref 1.7–2.4)

## 2018-11-02 LAB — PROCALCITONIN: Procalcitonin: 0.64 ng/mL

## 2018-11-02 LAB — CBG MONITORING, ED: Glucose-Capillary: 65 mg/dL — ABNORMAL LOW (ref 70–99)

## 2018-11-02 LAB — LIPASE, BLOOD: Lipase: 25 U/L (ref 11–51)

## 2018-11-02 LAB — AMMONIA: Ammonia: 72 umol/L — ABNORMAL HIGH (ref 9–35)

## 2018-11-02 LAB — ETHANOL: Alcohol, Ethyl (B): <10 mg/dL (ref <10)

## 2018-11-02 MED ORDER — SODIUM CHLORIDE 0.9 % IV SOLN: INTRAVENOUS | Status: DC | PRN

## 2018-11-02 MED ORDER — ASPIRIN 300 MG RE SUPP: 300.0000 mg | RECTAL | Status: DC

## 2018-11-02 MED ORDER — SODIUM CHLORIDE 0.9 % IV SOLN: INTRAVENOUS | Status: DC

## 2018-11-02 MED ORDER — IPRATROPIUM-ALBUTEROL 0.5-2.5 (3) MG/3ML IN SOLN: 3.0000 mL | Freq: Four times a day (QID) | RESPIRATORY_TRACT | Status: DC

## 2018-11-02 MED ORDER — NOREPINEPHRINE 4 MG/250ML-% IV SOLN
0.0000 ug/min | INTRAVENOUS | Status: DC
  Administered 2018-11-02 – 2018-11-04 (×2): 5 ug/min via INTRAVENOUS
  Filled 2018-11-02 (×2): qty 250

## 2018-11-02 MED ORDER — POTASSIUM CHLORIDE 10 MEQ/100ML IV SOLN
10.0000 meq | INTRAVENOUS | Status: AC
  Filled 2018-11-02: qty 100

## 2018-11-02 MED ORDER — FENTANYL CITRATE (PF) 100 MCG/2ML IJ SOLN
50.0000 ug | Freq: Once | INTRAMUSCULAR | Status: AC
  Administered 2018-11-04: 25 ug via INTRAVENOUS
  Filled 2018-11-02: qty 2

## 2018-11-02 MED ORDER — AMIODARONE HCL IN DEXTROSE 360-4.14 MG/200ML-% IV SOLN
60.0000 mg/h | INTRAVENOUS | Status: AC
  Administered 2018-11-02 (×2): 60 mg/h via INTRAVENOUS

## 2018-11-02 MED ORDER — EPINEPHRINE 1 MG/10ML IJ SOSY
PREFILLED_SYRINGE | INTRAMUSCULAR | Status: AC | PRN
  Administered 2018-11-02: 1 mg via INTRAVENOUS

## 2018-11-02 MED ORDER — SODIUM CHLORIDE 0.9 % IV SOLN
INTRAVENOUS | Status: DC
  Administered 2018-11-04: 13:00:00 via INTRAVENOUS

## 2018-11-02 MED ORDER — FENTANYL BOLUS VIA INFUSION
25.0000 ug | INTRAVENOUS | Status: DC | PRN
  Administered 2018-11-02 – 2018-11-05 (×4): 25 ug via INTRAVENOUS
  Filled 2018-11-02: qty 25

## 2018-11-02 MED ORDER — IOHEXOL 350 MG/ML SOLN
100.0000 mL | Freq: Once | INTRAVENOUS | Status: AC | PRN
  Administered 2018-11-02: 100 mL via INTRAVENOUS

## 2018-11-02 MED ORDER — PANTOPRAZOLE SODIUM 40 MG IV SOLR
40.0000 mg | Freq: Every day | INTRAVENOUS | Status: DC
  Administered 2018-11-02: 40 mg via INTRAVENOUS
  Filled 2018-11-02: qty 40

## 2018-11-02 MED ORDER — CHLORHEXIDINE GLUCONATE 0.12% ORAL RINSE (MEDLINE KIT)
15.0000 mL | Freq: Two times a day (BID) | OROMUCOSAL | Status: DC
  Administered 2018-11-02 – 2018-11-06 (×8): 15 mL via OROMUCOSAL

## 2018-11-02 MED ORDER — NALOXONE HCL 2 MG/2ML IJ SOSY
PREFILLED_SYRINGE | INTRAMUSCULAR | Status: AC
  Filled 2018-11-02: qty 2

## 2018-11-02 MED ORDER — ASPIRIN 81 MG PO CHEW
324.0000 mg | CHEWABLE_TABLET | ORAL | Status: DC
  Filled 2018-11-02: qty 4

## 2018-11-02 MED ORDER — PROPOFOL 1000 MG/100ML IV EMUL
25.0000 ug/kg/min | INTRAVENOUS | Status: DC
  Administered 2018-11-02: 30 ug/kg/min via INTRAVENOUS
  Administered 2018-11-02: 25 ug/kg/min via INTRAVENOUS
  Administered 2018-11-03: 35 ug/kg/min via INTRAVENOUS
  Administered 2018-11-03: 10 ug/kg/min via INTRAVENOUS
  Administered 2018-11-03: 20 ug/kg/min via INTRAVENOUS
  Administered 2018-11-04: 18 ug/kg/min via INTRAVENOUS
  Filled 2018-11-02 (×7): qty 100

## 2018-11-02 MED ORDER — VITAL HIGH PROTEIN PO LIQD
1000.0000 mL | ORAL | Status: DC
  Administered 2018-11-02 – 2018-11-03 (×2): 1000 mL
  Filled 2018-11-02 (×2): qty 1000

## 2018-11-02 MED ORDER — PANTOPRAZOLE SODIUM 40 MG IV SOLR: 40.0000 mg | Freq: Every day | INTRAVENOUS | Status: DC

## 2018-11-02 MED ORDER — PERFLUTREN LIPID MICROSPHERE
1.0000 mL | INTRAVENOUS | Status: AC | PRN
  Administered 2018-11-02: 3 mL via INTRAVENOUS
  Filled 2018-11-02: qty 10

## 2018-11-02 MED ORDER — INFLUENZA VAC SPLIT QUAD 0.5 ML IM SUSY: 0.5000 mL | PREFILLED_SYRINGE | INTRAMUSCULAR | Status: DC

## 2018-11-02 MED ORDER — AMIODARONE HCL IN DEXTROSE 360-4.14 MG/200ML-% IV SOLN
30.0000 mg/h | INTRAVENOUS | Status: DC
  Administered 2018-11-02 – 2018-11-03 (×3): 30 mg/h via INTRAVENOUS
  Filled 2018-11-02 (×4): qty 200

## 2018-11-02 MED ORDER — ORAL CARE MOUTH RINSE
15.0000 mL | OROMUCOSAL | Status: DC
  Administered 2018-11-02 – 2018-11-06 (×36): 15 mL via OROMUCOSAL

## 2018-11-02 MED ORDER — CHLORHEXIDINE GLUCONATE CLOTH 2 % EX PADS
6.0000 | MEDICATED_PAD | Freq: Every day | CUTANEOUS | Status: DC
  Administered 2018-11-02 – 2018-11-06 (×4): 6 via TOPICAL

## 2018-11-02 MED ORDER — LABETALOL HCL 5 MG/ML IV SOLN
10.0000 mg | INTRAVENOUS | Status: DC | PRN
  Administered 2018-11-02: 10 mg via INTRAVENOUS
  Filled 2018-11-02: qty 4

## 2018-11-02 MED ORDER — FENTANYL 2500MCG IN NS 250ML (10MCG/ML) PREMIX INFUSION
100.0000 ug/h | INTRAVENOUS | Status: DC
  Administered 2018-11-02 – 2018-11-04 (×2): 100 ug/h via INTRAVENOUS
  Administered 2018-11-05 (×2): 200 ug/h via INTRAVENOUS
  Administered 2018-11-06: 150 ug/h via INTRAVENOUS
  Filled 2018-11-02 (×5): qty 250

## 2018-11-02 MED ORDER — IPRATROPIUM-ALBUTEROL 0.5-2.5 (3) MG/3ML IN SOLN
3.0000 mL | Freq: Four times a day (QID) | RESPIRATORY_TRACT | Status: DC
  Administered 2018-11-02 – 2018-11-03 (×6): 3 mL via RESPIRATORY_TRACT
  Filled 2018-11-02 (×6): qty 3

## 2018-11-02 MED ORDER — HEPARIN SODIUM (PORCINE) 5000 UNIT/ML IJ SOLN
5000.0000 [IU] | Freq: Three times a day (TID) | INTRAMUSCULAR | Status: DC
  Administered 2018-11-02 – 2018-11-03 (×2): 5000 [IU] via SUBCUTANEOUS
  Filled 2018-11-02 (×2): qty 1

## 2018-11-02 MED FILL — Medication: Qty: 1 | Status: AC

## 2018-11-02 NOTE — Consult Note (Signed)
Reason for Consult: To manage dialysis and dialysis related needs Referring Physician: Bradie Lacock is an 63 y.o. 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  Was last there on 10/8 but did run his full treatment.  He presented to the emergency department this morning with chest pain.  After pain medication was administered he suffered a cardiac arrest which was felt to be brought on by respiratory failure.  He required chest compressions but then had ROSC.  He has been intubated and now in the ICU on the cooling protocol.  He appears to be showing a left lateral gaze to my exam.  He is on IV fentanyl however.  CODE STATUS was addressed by the family but unfortunately because he is survived cardiac arrest (s) in the past they wish for him to continue to be a full code.  He is not currently requiring pressors-FiO2 of 40%-potassium in the threes-hemoglobin over 12   Dialyzes at [facility]- TTS-3 hours and 45 minutes, profile 4 EDW 79.5. HD Bath 2/2, Dialyzer 180, Heparin no. Access -right upper arm AV fistula.  Gets Hectorol 1 mcg per treatment, currently on Mircera 225.  Last hemoglobin 10.0-last calcium 8.8, last phosphorus 4.8, last PTH 277  Past Medical History:  Diagnosis Date  . AICD (automatic cardioverter/defibrillator) present   . Cardiac arrest ([place]) 03/07/2013  . Cardiac arrest due to underlying cardiac condition ([place])   . CHF (congestive heart failure) ([place])   . Complication of anesthesia    got too much anesthesia and bp dropped very low Feb, 2019  . Edema   . Encounter for fitting or adjustment of implantable cardioverter-defibrillator (ICD)   . ESRD on dialysis [facility])    began HD in 2018  .  History of blood transfusion 1950's   "related to pinal menigitis; HAD 16 OPERATIONS TOTAL"  . History of ventricular fibrillation 03/23/2013  . Hyperlipemia   . Hypertension   . Ileus ([place]) 05/2015  . Myocardial infarction ([place]) 03/2013  . Pain in the chest   . Pneumonia 2016  . Rash and nonspecific skin eruption 06/29/2014  . Scabies infestation 06/29/2014  . Shortness of breath dyspnea   . Type II diabetes mellitus ([place])    type 2  . Ventricular tachycardia [facility])     Past Surgical History:  Procedure Laterality Date  . AV FISTULA PLACEMENT Right 03/18/2015   Procedure: BRACHIOCEPHALIC ARTERIOVENOUS (AV) FISTULA CREATION;  Surgeon: Angelia Mould, MD;  Location: [place];  Service: Vascular;  Laterality: Right;  . BACK SURGERY  1950's   "for spinal menigitis; HAD 16 OPERATIONS TOTAL"  . CARDIAC CATHETERIZATION    . CHOLECYSTECTOMY N/A 06/17/2015   Procedure: LAPAROSCOPIC CHOLECYSTECTOMY;  Surgeon: Coralie Keens, MD;  Location: [place];  Service: General;  Laterality: N/A;  . CORONARY ARTERY BYPASS GRAFT  1999   cabg x4  . 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 HEAD SURGERY  1950'S   "for spinal menigitis; HAD 16 OPERATIONS TOTAL"  . IMPLANTABLE CARDIOVERTER DEFIBRILLATOR IMPLANT  03/24/2013   STJ single chamber ICD implanted by Dr Caryl Comes for secondary prevention  . IMPLANTABLE CARDIOVERTER DEFIBRILLATOR IMPLANT N/A 03/24/2013   Procedure: IMPLANTABLE CARDIOVERTER DEFIBRILLATOR IMPLANT;  Surgeon: Deboraha Sprang, MD;  Location: [facility] CATH LAB;  Service: Cardiovascular;  Laterality: N/A;  . INSERTION OF DIALYSIS CATHETER N/A 03/13/2015   Procedure: INSERTION OF TUNNELED DIALYSIS CATHETER;  Surgeon: Conrad Dupont, MD;  Location: [place];  Service: Vascular;  Laterality: N/A;  . IR PARACENTESIS  08/20/2018  . IR PARACENTESIS  09/11/2018  . LEFT HEART CATHETERIZATION WITH CORONARY/GRAFT ANGIOGRAM  03/07/2013   Procedure: LEFT HEART CATHETERIZATION WITH  [name];  Surgeon: Clent Demark, MD;  Location: [facility] CATH LAB;  Service: Cardiovascular;;  . REVISON OF ARTERIOVENOUS FISTULA Right 01/03/2018   Procedure: REVISION OF ARTERIOVENOUS FISTULA;  Surgeon: Waynetta Sandy, MD;  Location: [place];  Service: Vascular;  Laterality: Right;  . REVISON OF ARTERIOVENOUS FISTULA Right 09/16/2018   Procedure: REVISION PLICATION OF ARTERIOVENOUS FISTULA RIGHT ARM;  Surgeon: Angelia Mould, MD;  Location: [place];  Service: Vascular;  Laterality: Right;  . UMBILICAL HERNIA REPAIR N/A 06/24/2015   Procedure: HERNIA REPAIR UMBILICAL ADULT;  Surgeon: Georganna Skeans, MD;  Location: [facility] OR;  Service: General;  Laterality: N/A;    Family History  Problem Relation Age of Onset  . Heart disease Mother   . Diabetes Mother     Social History:  reports that he has quit smoking. His smoking use included cigarettes. He has a 5.00 pack-year smoking history. He has never used smokeless tobacco. He reports that he does not drink alcohol or use drugs.  Allergies: No Known Allergies  Medications: I have reviewed the patient's current medications.   Results for orders placed or performed during the hospital encounter of 11/09/2018 (from the past 48 hour(s))  CBG monitoring, ED     Status: Abnormal   Collection Time: 11/15/2018  7:20 AM  Result Value Ref Range   Glucose-Capillary 65 (L) 70 - 99 mg/dL  I-stat chem 8, ed     Status: Abnormal   Collection Time: 11/19/2018  7:27 AM  Result Value Ref Range   Sodium 134 (L) 135 - 145 mmol/L   Potassium 3.5 3.5 - 5.1 mmol/L   Chloride 92 (L) 98 - 111 mmol/L   BUN 43 (H) 8 - 23 mg/dL   Creatinine, Ser 7.20 (H) 0.61 - 1.24 mg/dL   Glucose, Bld 89 70 - 99 mg/dL   Calcium, Ion 1.10 (L) 1.15 - 1.40 mmol/L   TCO2 30 22 - 32 mmol/L   Hemoglobin 12.6 (L) 13.0 - 17.0 g/dL   HCT 37.0 (L) 39.0 - 52.0 %  SARS Coronavirus 2 by RT PCR (hospital order, performed in [place] hospital lab) Nasopharyngeal  Nasopharyngeal Swab     Status: None   Collection Time: 11/22/2018  7:32 AM   Specimen: Nasopharyngeal Swab  Result Value Ref Range   SARS Coronavirus 2 NEGATIVE NEGATIVE    Comment: (NOTE) If result is NEGATIVE SARS-CoV-2 target nucleic acids are NOT DETECTED. The SARS-CoV-2 RNA is generally detectable in upper and lower  respiratory specimens during the acute phase of infection. The lowest  concentration of SARS-CoV-2 viral copies this assay can detect is 250  copies / mL. A negative result does not preclude SARS-CoV-2 infection  and should not be used as the sole basis for treatment or other  patient management decisions.  A negative result may occur with  improper specimen collection / handling, submission of specimen other  than nasopharyngeal swab, presence of viral mutation(s) within the  areas targeted by this assay, and inadequate number of viral copies  (<250  copies / mL). A negative result must be combined with clinical  observations, patient history, and epidemiological information. If result is POSITIVE SARS-CoV-2 target nucleic acids are DETECTED. The SARS-CoV-2 RNA is generally detectable in upper and lower  respiratory specimens dur ing the acute phase of infection.  Positive  results are indicative of active infection with SARS-CoV-2.  Clinical  correlation with patient history and other diagnostic information is  necessary to determine patient infection status.  Positive results do  not rule out bacterial infection or co-infection with other viruses. If result is PRESUMPTIVE POSTIVE SARS-CoV-2 nucleic acids MAY BE PRESENT.   A presumptive positive result was obtained on the submitted specimen  and confirmed on repeat testing.  While 2019 novel coronavirus  (SARS-CoV-2) nucleic acids may be present in the submitted sample  additional confirmatory testing may be necessary for epidemiological  and / or clinical management purposes  to differentiate between  SARS-CoV-2  and other Sarbecovirus currently known to infect humans.  If clinically indicated additional testing with an alternate test  methodology [phone]) is advised. The SARS-CoV-2 RNA is generally  detectable in upper and lower respiratory sp ecimens during the acute  phase of infection. The expected result is Negative. Fact Sheet for Patients:  StrictlyIdeas.no Fact Sheet for Healthcare Providers: BankingDealers.co.za This test is not yet approved or cleared by the [place] FDA and has been authorized for detection and/or diagnosis of SARS-CoV-2 by FDA under an Emergency Use Authorization (EUA).  This EUA will remain in effect (meaning this test can be used) for the duration of the COVID-19 declaration under Section 564(b)(1) of the Act, 21 U.S.C. section 360bbb-3(b)(1), unless the authorization is terminated or revoked sooner. Performed at [facility], [place] [address] [place], [place] [address]   Troponin I (High Sensitivity)     Status: Abnormal   Collection Time: 11/18/2018  7:53 AM  Result Value Ref Range   Troponin I (High Sensitivity) 40 (H) <18 ng/L    Comment: (NOTE) Elevated high sensitivity troponin I (hsTnI) values and significant  changes across serial measurements may suggest ACS but many other  chronic and acute conditions are known to elevate hsTnI results.  Refer to the "Links" section for chest pain algorithms and additional  guidance. Performed at [facility], [place] [address] [place], [place] [address]   Comprehensive metabolic panel     Status: Abnormal   Collection Time: 10/30/2018  7:53 AM  Result Value Ref Range   Sodium 131 (L) 135 - 145 mmol/L   Potassium 4.1 3.5 - 5.1 mmol/L   Chloride 92 (L) 98 - 111 mmol/L   CO2 23 22 - 32 mmol/L   Glucose, Bld 128 (H) 70 - 99 mg/dL   BUN 37 (H) 8 - 23 mg/dL   Creatinine, Ser 7.24 (H) 0.61 - 1.24 mg/dL   Calcium 8.7 (L) 8.9 - 10.3 mg/dL   Total Protein 7.4  6.5 - 8.1 g/dL   Albumin 2.8 (L) 3.5 - 5.0 g/dL   AST 31 15 - 41 U/L   ALT 15 0 - 44 U/L   Alkaline Phosphatase 52 38 - 126 U/L   Total Bilirubin 1.0 0.3 - 1.2 mg/dL   GFR calc non Af Amer 7 (L) >60 mL/min   GFR calc Af Amer 8 (L) >60 mL/min   Anion gap 16 (H) 5 - 15    Comment: Performed at [facility], [place] [address] [place], [place] [address]  Magnesium  Status: None   Collection Time: 10/24/2018  7:53 AM  Result Value Ref Range   Magnesium 2.2 1.7 - 2.4 mg/dL    Comment: Performed at [facility], [place] [address] [place], [place] [address]  Phosphorus     Status: Abnormal   Collection Time: 11/19/2018  7:53 AM  Result Value Ref Range   Phosphorus 5.0 (H) 2.5 - 4.6 mg/dL    Comment: Performed at [place] [address] [place], [place] [address]  CK     Status: None   Collection Time: 11/21/2018  7:53 AM  Result Value Ref Range   Total CK 116 49 - 397 U/L    Comment: Performed at [facility], [place] [address] [place], [place] [address]  CBC with Differential/Platelet     Status: Abnormal   Collection Time: 11/05/2018  8:03 AM  Result Value Ref Range   WBC 6.8 4.0 - 10.5 K/uL   RBC 3.05 (L) 4.22 - 5.81 MIL/uL   Hemoglobin 9.6 (L) 13.0 - 17.0 g/dL   HCT 31.6 (L) 39.0 - 52.0 %   MCV 103.6 (H) 80.0 - 100.0 fL   MCH 31.5 26.0 - 34.0 pg   MCHC 30.4 30.0 - 36.0 g/dL   RDW 16.6 (H) 11.5 - 15.5 %   Platelets 58 (L) 150 - 400 K/uL    Comment: REPEATED TO VERIFY PLATELET COUNT CONFIRMED BY SMEAR SPECIMEN CHECKED FOR CLOTS Immature Platelet Fraction may be clinically indicated, consider ordering this additional test [id number]    nRBC 0.0 0.0 - 0.2 %   Neutrophils Relative % 50 %   Neutro Abs 3.4 1.7 - 7.7 K/uL   Lymphocytes Relative 40 %   Lymphs Abs 2.7 0.7 - 4.0 K/uL   Monocytes Relative 10 %   Monocytes Absolute 0.6 0.1 - 1.0 K/uL   Eosinophils Relative 0 %   Eosinophils Absolute 0.0 0.0 - 0.5 K/uL   Basophils Relative 0 %   Basophils  Absolute 0.0 0.0 - 0.1 K/uL   Immature Granulocytes 0 %   Abs Immature Granulocytes 0.03 0.00 - 0.07 K/uL    Comment: Performed at [place] [address], [place], [place] [address]  Lactic acid, plasma     Status: Abnormal   Collection Time: 11/19/2018  8:13 AM  Result Value Ref Range   Lactic Acid, Venous 4.4 (HH) 0.5 - 1.9 mmol/L    Comment: CRITICAL RESULT CALLED TO, READ BACK BY AND VERIFIED WITH: K.RAND RN @ [phone] 11/05/2018 BY C.EDENS Performed at [facility], [place] [address], [place], [place] [address]   Ethanol     Status: None   Collection Time: 11/15/2018  8:16 AM  Result Value Ref Range   Alcohol, Ethyl (B) <10 <10 mg/dL    Comment: (NOTE) Lowest detectable limit for serum alcohol is 10 mg/dL. For medical purposes only. Performed at [facility], [place] [address] [place], [place] [address]   Protime-INR     Status: Abnormal   Collection Time: 11/08/2018  8:17 AM  Result Value Ref Range   Prothrombin Time 20.8 (H) 11.4 - 15.2 seconds   INR 1.8 (H) 0.8 - 1.2    Comment: (NOTE) INR goal varies based on device and disease states. Performed at [facility], [place] [address] [place], [place] [address]   I-STAT 7, (LYTES, BLD GAS, ICA, H+H)     Status: Abnormal   Collection Time: 10/28/2018  9:12 AM  Result Value Ref Range   pH, Arterial 7.547 (H) 7.350 - 7.450   pCO2 arterial 28.8 (L) 32.0 - 48.0 mmHg   pO2, Arterial 173.0 (H) 83.0 - 108.0 mmHg   Bicarbonate 25.0 20.0 - 28.0 mmol/L   TCO2 26 22 - 32 mmol/L   O2 Saturation 100.0 %   Acid-Base Excess 3.0 (H) 0.0 - 2.0 mmol/L   Sodium 131 (L) 135 - 145 mmol/L   Potassium 3.1 (L) 3.5 - 5.1 mmol/L   Calcium, Ion 1.14 (L) 1.15 - 1.40 mmol/L   HCT 30.0 (L) 39.0 - 52.0 %   Hemoglobin 10.2 (L) 13.0 - 17.0 g/dL   Patient temperature 98.6 F    Collection site ARTERIAL LINE    Drawn by Operator    Sample type ARTERIAL   Lactic acid, plasma     Status: Abnormal   Collection Time: 11/09/2018  9:30 AM   Result Value Ref Range   Lactic Acid, Venous 2.3 (HH) 0.5 - 1.9 mmol/L    Comment: CRITICAL VALUE NOTED.  VALUE IS CONSISTENT WITH PREVIOUSLY REPORTED AND CALLED VALUE. Performed at [facility], [place] [address], [place], [place] [address]   Ammonia     Status: Abnormal   Collection Time: 11/22/2018  9:30 AM  Result Value Ref Range   Ammonia 72 (H) 9 - 35 umol/L    Comment: Performed at [facility], [place] [address] [place], [place] [address]  Troponin I (High Sensitivity)     Status: Abnormal   Collection Time: 11/14/2018  9:46 AM  Result Value Ref Range   Troponin I (High Sensitivity) 135 (HH) <18 ng/L    Comment: CRITICAL RESULT CALLED TO, READ BACK BY AND VERIFIED WITH: K REND,RN [phone] [id number] WILDERK (NOTE) Elevated high sensitivity troponin I (hsTnI) values and significant  changes across serial measurements may suggest ACS but many other  chronic and acute conditions are known to elevate hsTnI results.  Refer to the Links section for chest pain algorithms and additional  guidance. Performed at [facility], [place] [address] [place], [place] [address]   I-STAT 7, (LYTES, BLD GAS, ICA, H+H)     Status: Abnormal   Collection Time: 11/18/2018 12:20 PM  Result Value Ref Range   pH, Arterial 7.607 (HH) 7.350 - 7.450   pCO2 arterial 23.5 (L) 32.0 - 48.0 mmHg   pO2, Arterial 73.0 (L) 83.0 - 108.0 mmHg   Bicarbonate 23.8 20.0 - 28.0 mmol/L   TCO2 25 22 - 32 mmol/L   O2 Saturation 98.0 %   Acid-Base Excess 3.0 (H) 0.0 - 2.0 mmol/L   Sodium 133 (L) 135 - 145 mmol/L   Potassium 3.6 3.5 - 5.1 mmol/L   Calcium, Ion 1.12 (L) 1.15 - 1.40 mmol/L   HCT 37.0 (L) 39.0 - 52.0 %   Hemoglobin 12.6 (L) 13.0 - 17.0 g/dL   Patient temperature 95.2 F    Sample type ARTERIAL   Glucose, capillary     Status: Abnormal   Collection Time: 11/15/2018 12:21 PM  Result Value Ref Range   Glucose-Capillary 167 (H) 70 - 99 mg/dL  I-STAT 7, (LYTES, BLD GAS, ICA, H+H)     Status: Abnormal    Collection Time: 11/01/2018 12:53 PM  Result Value Ref Range   pH, Arterial 7.610 (HH) 7.350 - 7.450   pCO2 arterial 23.8 (L) 32.0 - 48.0 mmHg   pO2, Arterial 73.0 (L) 83.0 - 108.0 mmHg   Bicarbonate  24.2 20.0 - 28.0 mmol/L   TCO2 25 22 - 32 mmol/L   O2 Saturation 98.0 %   Acid-Base Excess 3.0 (H) 0.0 - 2.0 mmol/L   Sodium 130 (L) 135 - 145 mmol/L   Potassium 3.5 3.5 - 5.1 mmol/L   Calcium, Ion 1.09 (L) 1.15 - 1.40 mmol/L   HCT 36.0 (L) 39.0 - 52.0 %   Hemoglobin 12.2 (L) 13.0 - 17.0 g/dL   Patient temperature 35.2 C    Collection site ARTERIAL LINE    Drawn by RT    Sample type ARTERIAL    Comment NOTIFIED PHYSICIAN     Ct Head Wo Contrast  Result Date: 10/29/2018 CLINICAL DATA:  Altered level of consciousness, chest pain EXAM: CT HEAD WITHOUT CONTRAST TECHNIQUE: Contiguous axial images were obtained from the base of the skull through the vertex without intravenous contrast. COMPARISON:  03/18/2015 FINDINGS: Brain: Chronic ventriculomegaly, largely stable. No new midline shift. No evidence of acute infarction, hemorrhage, extra-axial collection, or mass. Vascular: Atherosclerotic and physiologic intracranial calcifications. Skull: Normal. Negative for fracture or focal lesion. Sinuses/Orbits: Mucoperiosteal thickening in the right maxillary sinus. Fluid level in the sphenoid sinus. Other: None IMPRESSION: 1. Negative for bleed or other acute intracranial process. 2. Stable chronic ventriculomegaly. 3. Right maxillary and sphenoid sinus disease. Electronically Signed   By: Lucrezia Europe M.D.   On: 11/13/2018 09:02   Dg Chest Portable 1 View  Result Date: 10/30/2018 CLINICAL DATA:  63 year old male status post CPR EXAM: PORTABLE CHEST 1 VIEW COMPARISON:  Prior chest x-ray 10/15/2018 FINDINGS: Patient is intubated. The tip of the endotracheal tube is 4.1 cm above the carina. A left subclavian approach cardiac rhythm maintenance device remains in unchanged position. The lead terminates overlying  the right ventricle. Inspiratory volumes are low. Stable cardiomegaly. Atherosclerotic calcifications again noted in the transverse aorta. The low inspiratory volumes results in crowding of pulmonary vasculature. Stable bronchitic changes. No large effusion, pulmonary edema or pneumothorax. No focal airspace consolidation. No acute osseous abnormality. IMPRESSION: 1. Low inspiratory volumes without evidence of acute cardiopulmonary process. 2. The tip of the endotracheal tube is 4 cm above the carina. Electronically Signed   By: Jacqulynn Cadet M.D.   On: 10/24/2018 08:04   Ct Angio Chest/abd/pel For Dissection W And/or W/wo  Result Date: 10/29/2018 CLINICAL DATA:  63 year old male with chest pain, cardiopulmonary arrest. Additional clinical history includes HCV cirrhosis and end-stage renal disease on hemodialysis. EXAM: CT ANGIOGRAPHY CHEST, ABDOMEN AND PELVIS TECHNIQUE: Multidetector CT imaging through the chest, abdomen and pelvis was performed using the standard protocol during bolus administration of intravenous contrast. Multiplanar reconstructed images and MIPs were obtained and reviewed to evaluate the vascular anatomy. CONTRAST:  174mL OMNIPAQUE IOHEXOL 350 MG/ML SOLN COMPARISON:  Prior CT abdomen and pelvis 08/20/2018 FINDINGS: CTA CHEST FINDINGS Cardiovascular: Adequate opacification of the thoracic aorta and pulmonary arteries. 2 vessel aortic arch. The right brachiocephalic and left common carotid artery share a common origin. Scattered heterogeneous atherosclerotic plaque. No evidence of aneurysm. The aortic root is unremarkable. Patient is status post multivessel CABG. The visualized grafts appear patent. Calcifications present within the native coronary arteries. The main pulmonary artery is normal in caliber. Evaluation of the segmental and subsegmental arteries limited by respiratory motion artifact. No evidence of filling defect to suggest acute pulmonary embolus. Cardiomegaly with right  heart enlargement. Cardiac rhythm maintenance device. The lead terminates in the right ventricle. No significant pericardial effusion. Mediastinum/Nodes: No mediastinal mass or suspicious adenopathy. Unremarkable thyroid gland.  The esophagus conveys a gastric tube in the stomach. Lungs/Pleura: Small layering right pleural effusion. Trace layering left pleural effusion. Associated atelectasis. Greater than expected volume of atelectasis in the posterior right upper lobe. There is some associated bronchial wall thickening. This may represent small volume mucous plugging versus an early infectious/inflammatory process such as bronchopneumonia. 6 mm subpleural pulmonary nodule in the periphery of the right middle lobe remain unchanged and is likely benign. No evidence of pneumothorax. Musculoskeletal: No acute fracture or aggressive appearing lytic or blastic osseous lesion. No rib fractures visualized. Healed median sternotomy. Review of the MIP images confirms the above findings. CTA ABDOMEN AND PELVIS FINDINGS VASCULAR Aorta: Normal in caliber. Mild atherosclerotic plaque. No evidence of dissection or penetrating ulcer. Celiac: Mild median arcuate ligament compression. No evidence of aneurysm or dissection. SMA: Patent without evidence of aneurysm, dissection, vasculitis or significant stenosis. Renals: Solitary right kidney. The renal artery is patent and unremarkable. No evidence of aneurysm or fibromuscular dysplasia. IMA: Patent without evidence of aneurysm, dissection, vasculitis or significant stenosis. Inflow: Scattered atherosclerotic plaque without significant stenosis or occlusion. No evidence of aneurysm or dissection. Veins: No obvious venous abnormality within the limitations of this arterial phase study. Review of the MIP images confirms the above findings. NON-VASCULAR Hepatobiliary: Hepatic cirrhosis. Mildly dilated intrahepatic IVC and hepatic veins suggests elevated right heart pressures. The  gallbladder is surgically absent. No arterially enhancing liver lesion. Pancreas: Unremarkable. No pancreatic ductal dilatation or surrounding inflammatory changes. Spleen: Normal in size without focal abnormality. Adrenals/Urinary Tract: Mild adreniform thickening bilaterally. No discrete nodules. Solitary right kidney. No evidence of hydronephrosis, nephrolithiasis or enhancing renal mass. The left kidney is visualized but is severely atretic. Unremarkable ureters and bladder. Stomach/Bowel: Colonic diverticular disease without CT evidence of active inflammation. No evidence of obstruction or focal bowel wall thickening. Normal appendix in the right lower quadrant. The terminal ileum is unremarkable. Lymphatic: No suspicious lymphadenopathy. Reproductive: Prostate gland is unremarkable. Other: Moderate to large volume peritoneal ascites. Additionally, the greater omentum is diffusely edematous. With internal fluid densities. Musculoskeletal: No acute fracture or aggressive appearing lytic or blastic osseous lesion. Review of the MIP images confirms the above findings. IMPRESSION: CTA CHEST 1. No evidence of aortic dissection, pulmonary embolism or other acute vascular abnormality. 2. Cardiomegaly with evidence of elevated right heart pressures. 3. Advanced native coronary artery disease with surgical changes of prior multivessel CABG. The visualized portions of the bypass grafts appear patent. 4. Small right and trace left pleural effusions. 5. Greater than expected atelectasis in the dependent aspect of the right upper lobe. This may be secondary to recent cardiac arrest, or, in the appropriate clinical setting, reflect early bronchopneumonia. CTA ABD/PELVIS 1. No evidence of abdominal aortic aneurysm, dissection or other acute vascular abnormality. 2. Incidental note is made of mild median arcuate ligament compression the origin of the celiac artery. 3.  Aortic Atherosclerosis (ICD10-170.0). 4. Hepatic cirrhosis  with moderate to large volume ascites. 5. Colonic diverticular disease without CT evidence of active inflammation. Signed, Criselda Peaches, MD, [facility] Electronically Signed   By: Jacqulynn Cadet M.D.   On: 11/16/2018 09:13    ROS: Unable to obtain secondary to patient factors Blood pressure (!) 170/84, pulse 70, temperature (!) 96 F (35.6 C), temperature source Temporal, resp. rate (!) 23, height 5\' 6"  (1.676 m), weight 85.7 kg, SpO2 100 %. General appearance: slowed mentation and Seems to be exhibiting a leftward gaze Resp: clear to auscultation  bilaterally Cardio: regular rate and rhythm, S1, S2 normal, no murmur, click, rub or gallop GI: soft, non-tender; bowel sounds normal; no masses,  no organomegaly Extremities: venous stasis dermatitis noted and No obvious edema Neurologic: Mental status: Patient with a left gaze.  Not responsive to voice, no commands Right upper aVF which is aneurysmal but patent  Assessment/Plan: 63 year old white male with many medical issues including ESRD.  He is status post an arrest requiring CPR-now intubated in the ICU on cooling protocol 1 status post cardiac arrest-requiring CPR but with ROSC.  In the ICU with decreased mental status on cooling protocol at present. 2 ESRD: Normally TTS so today is his dialysis day.  There are no indications for dialysis at this time and I hesitate to perform it with recent hemodynamic instability.  I will continue to monitor the patient and we will perform hemodialysis once patient has stabilized more 3 Hypertension: Not hypotensive, if anything slightly hypertensive.  He is on hydralazine as an outpatient.  For now while in ICU to use as needed antihypertensives as needed.  FiO2 40% on the vent and no significant peripheral edema noted 4. Anemia of ESRD: No significant derangement at this time.  Actually improved from last check at the kidney center  which was 10.0 5. Metabolic Bone Disease: Home meds include Hectorol 1 mcg per treatment Sensipar 30 mg daily and PhosLo 2 with meals.  These will be resumed when patient is taking p.o.'s   Louis Meckel 11/06/2018, 1:59 PM

## 2018-11-02 NOTE — Procedures (Signed)
Intubation Procedure Note Ciaran Begay [id number] 1955-07-26  Procedure: Intubation Indications: Respiratory insufficiency  Procedure Details Consent: Unable to obtain consent because of emergent medical necessity. Time Out: Verified patient identification, verified procedure, site/side was marked, verified correct patient position, special equipment/implants available, medications/allergies/relevent history reviewed, required imaging and test results available.  Performed  Maximum sterile technique was used including cap, gloves, gown, hand hygiene and mask.  MAC and 4    Evaluation Hemodynamic Status: Persistent hypotension treated with pressors and fluid; O2 sats: transiently fell during during procedure Patient's Current Condition: unstable Complications: No apparent complications Patient did tolerate procedure well. Chest X-ray ordered to verify placement.  CXR: tube position acceptable.   Dimple Nanas 11/13/2018

## 2018-11-02 NOTE — ED Notes (Signed)
[id number]

## 2018-11-02 NOTE — ED Notes (Signed)
Calcium chloride b ristojet iv karen newnam rn

## 2018-11-02 NOTE — Progress Notes (Signed)
Rt NOTE: RT and RN transported patient on Ventilator to CT and back to ED. Vital signs stable through out.

## 2018-11-02 NOTE — ED Notes (Signed)
Patient transported to CT by RN, EMT and RT.

## 2018-11-02 NOTE — Progress Notes (Signed)
Called wife and family to let them know that patient  Awakened and followed commands. Also that he is on some sedation.  Likely that he will have a paracentesis tomorrow to decrease abd fluid.  Report given to [facility] RN

## 2018-11-02 NOTE — ED Notes (Signed)
breif period of vt then back to nsr

## 2018-11-02 NOTE — ED Notes (Signed)
amadaroine drip  By bobby rn   60 mg /hr

## 2018-11-02 NOTE — ED Provider Notes (Signed)
Patient arrived at end of shift for unresponsiveness.  Patient apparently was complained of severe chest pain leg pain and arm pain with EMS he given multiple doses of fentanyl to include 200 mcg.  He apparently went unresponsive on arrival.  On my initial examination patient had a pulse but was difficult to bag secondary to obstruction by his tongue.  Naso-pharyngeal airway was placed without much difficulty making bagging easier.  2mg  narcan without response. Subsequently patient lost pulses.  CPR was initiated.  Secondary to being a dialysis patient I gave him calcium and bicarb for possible hyperkalemia but also epinephrine.  At that time Dr. Vallery Ridge arrived and assumed care.  Please see her note for procedures and further management.     Remus Hagedorn, Corene Cornea, MD 11/09/[id number]

## 2018-11-02 NOTE — H&P (Addendum)
PULMONARY / CRITICAL CARE MEDICINE   NAME:  Eric [name], MRN:  [id number], DOB:  October 20, 1955, LOS: 0 ADMISSION DATE:  11/15/2018,  REFERRING MD: 77 department physician, CHIEF COMPLAINT: Status post arrest  BRIEF HISTORY:    63 year old male with multiple medical comorbidities of cardiac arrest most likely secondary to respiratory event narcotics intubated in the emergency department on 10/29/2018 HISTORY OF PRESENT ILLNESS   63 year old with a plethora of health issues with included although not exclusive of hepatitis C, coronary artery disease status post ICD with a history of cardiac arrest.  Cirrhosis with normal ascites requiring monthly paracentesis.  End-stage renal disease but the right forearm AV graft.  Hypertension.  Type 2 diabetes mellitus.  He was in his usual state of poor health until 10/19/202021 is having significant pain primarily chest EMS was activated he was placed in the unit fentanyl 50 mcg notable was given to titrate his pain away.  Unfortunately 200 mcg in his right emergency department related to what is presumed to cardiac arrest respiratory insufficiency.  He required a short period of chest compressions with return of spontaneous circulation. At the time of this dictation he is not requiring vasoactive pressors.  Still on oral mechanical monitory support with FiO2 down down to 40%.  He did have some bloody secretions overnight.  He does have cirrhosis and there is has a coagulopathic state. Pulmonary critical care asked to admit.  We will not pursue temperature guided hypothermia due to his multiple comorbidities.  Currently he is a full code with his multiple health issues consideration may be made to address goals of care in the near future. SIGNIFICANT PAST MEDICAL HISTORY   Hepatitis C positive Diabetes mellitus Coronary artery disease status post cardiac arrest x2 AICD in place Hypertension Cirrhosis Congestive heart failure End-stage renal  disease  SIGNIFICANT EVENTS:  11/17/2018 cardiac arrest STUDIES:    CULTURES:  11/18/2018 blood cultures x2>> 11/04/2018 sputum culture>>   ANTIBIOTICS:  11/01/2018 Unasyn for at least 3 days secondary to questionable aspiration while chest compressions>>  LINES/TUBES:  10/28/2018 endotracheal.>> 11/04/2018 right EJ>> CONSULTANTS:  11/02/2018 cardiology SUBJECTIVE:  63 year old who was sedated with fentanyl in the ambulance became apneic of cardiac arrest was intubated in the emergency department.  CONSTITUTIONAL: BP (!) 143/83   Pulse 70   Temp (!) 96 F (35.6 C) (Temporal)   Resp 19   Ht 5\' 6"  (1.676 m)   Wt 85.7 kg   SpO2 99%   BMI 30.51 kg/m   No intake/output data recorded.     Vent Mode: PRVC FiO2 (%):  [40 %-75 %] 40 % Set Rate:  [14 bmp-20 bmp] 14 bmp Vt Set:  [510 mL] 510 mL PEEP:  [8 cmH20] 8 cmH20 Plateau Pressure:  [25 cmH20] 25 cmH20  PHYSICAL EXAM: General: Chronically ill-appearing male who is currently on full mechanical ventilatory support. Neuro: Currently off all sedation.  Withdraws extremities to noxious stimuli.  Does not track or follow at this time. HEENT: Pupils equal reactive to light.  Endotracheal tube is in place. Cardiovascular: Heart sounds are regular, wide-complex QRSs are regular.  Noted to have pacing spikes intermittently Lungs: Coarse rhonchi bilaterally Abdomen: Distended, positive fluid wave.  Positive bowel sounds Musculoskeletal: Grossly intact Skin: Right forearm AV fistula positive bruit and thrill.  Bilateral lower extremities with chronic vascular insufficiency.  RESOLVED PROBLEM LIST   ASSESSMENT AND PLAN   Vent dependent respiratory failure likely secondary from respiratory arrest in the setting of pain being given narcotics  and poor underlying health state. Vent bundle Wean FiO2 Recheck arterial blood gas Hold all sedation until awake Remains to be extubated well since he was intubated due to presumed  narcotic overdose.  Status post arrest most likely secondary to respiratory depression from narcotic.  He has an extensive cardiac history including cardiac arrest along with AICD in place.  Initially cardiac compressions therefore troponins will be less revealing at this time. Admit to heart. Cardiology consult Not placed on anticoagulation at this time due to underlying cirrhosis and most likely this is secondary to respiratory arrest not a cardiac event. Amiodarone drip per cardiology Monitor replete electrolytes as needed  History of hep C positive He is being followed by infectious disease Rayvon Char May need infectious disease consult in the near future.  Diabetes mellitus Sliding scale insulin protocol  Some question of aspiration Monitor chest x-ray No antimicrobial therapy at this time  End-stage renal disease Nephrology consult  SUMMARY OF TODAY'S PLAN:  63 year old with a plethora of health issues with included although not exclusive of hepatitis C, coronary artery disease status post ICD with a history of cardiac arrest.  Cirrhosis with normal ascites requiring monthly paracentesis.  End-stage renal disease but the right forearm AV graft.  Hypertension.  Type 2 diabetes mellitus.  He was in his usual state of poor health until 10/09/202021 is having significant pain primarily chest EMS was activated he was placed in the unit fentanyl 50 mcg notable was given to titrate his pain away.  Unfortunately 200 mcg in his right emergency department related to what is presumed to cardiac arrest respiratory insufficiency.  He required a short period of chest compressions with return of spontaneous circulation. At the time of this dictation he is not requiring vasoactive pressors.  Still on oral mechanical monitory support with FiO2 down down to 40%.  He did have some bloody secretions overnight.  He does have cirrhosis and there is has a coagulopathic state. Pulmonary critical care asked  to admit.  We will not pursue temperature guided hypothermia due to his multiple comorbidities.  Currently he is a full code with his multiple health issues consideration may be made to address goals of care in the near future.  Best Practice / Goals of Care / Disposition.   DVT PROPHYLAXIS: Sequential compression hose SUP PPI NUTRITION: Evaluate for tube. MOBILITY: Bedrest GOALS OF CARE: Currently full code FAMILY DISCUSSIONS: No family at the bedside 11/06/2018 DISPOSITION need to intensive care unit.  Hypothermia protocol due to multiple medical comorbidities  LABS  Glucose Recent Labs  Lab 11/15/2018 0720  GLUCAP 65*    BMET Recent Labs  Lab 11/15/2018 0727 11/06/2018 0753 11/23/2018 0912  NA 134* 131* 131*  K 3.5 4.1 3.1*  CL 92* 92*  --   CO2  --  23  --   BUN 43* 37*  --   CREATININE 7.20* 7.24*  --   GLUCOSE 89 128*  --     Liver Enzymes Recent Labs  Lab 11/09/2018 0753  AST 31  ALT 15  ALKPHOS 52  BILITOT 1.0  ALBUMIN 2.8*    Electrolytes Recent Labs  Lab 11/22/2018 0753  CALCIUM 8.7*  MG 2.2  PHOS 5.0*    CBC Recent Labs  Lab 11/17/2018 0727 11/11/2018 0803 11/02/18 0912  WBC  --  6.8  --   HGB 12.6* 9.6* 10.2*  HCT 37.0* 31.6* 30.0*  PLT  --  58*  --     ABG Recent Labs  Lab 11/17/2018 0912  PHART 7.547*  PCO2ART 28.8*  PO2ART 173.0*    Coag's Recent Labs  Lab 10/27/2018 0817  INR 1.8*    Sepsis Markers Recent Labs  Lab 11/03/2018 0813  LATICACIDVEN 4.4*    Cardiac Enzymes No results for input(s): TROPONINI, PROBNP in the last 168 hours.  PAST MEDICAL HISTORY :   He  has a past medical history of AICD (automatic cardioverter/defibrillator) present, Cardiac arrest ([place]) (03/07/2013), Cardiac arrest due to underlying cardiac condition ([place]), CHF (congestive heart failure) ([place]), Complication of anesthesia, Edema, Encounter for fitting or adjustment of implantable cardioverter-defibrillator (ICD), ESRD on dialysis [facility]), History of blood  transfusion (1950's), History of ventricular fibrillation (03/23/2013), Hyperlipemia, Hypertension, Ileus ([place]) (05/2015), Myocardial infarction ([place]) (03/2013), Pain in the chest, Pneumonia (2016), Rash and nonspecific skin eruption (06/29/2014), Scabies infestation (06/29/2014), Shortness of breath dyspnea, Type II diabetes mellitus ([place]), and Ventricular tachycardia ([place]).  PAST SURGICAL HISTORY:  He  has a past surgical history that includes Implantable cardioverter defibrillator implant (03/24/2013); left heart catheterization with coronary/graft angiogram (03/07/2013); implantable cardioverter defibrillator implant (N/A, 03/24/2013); Cardiac catheterization; HEAD SURGERY (1950'S); Back surgery (1950's); Eye surgery (Bilateral, 1950's); Coronary artery bypass graft (1999); Insertion of dialysis catheter (N/A, 03/13/2015); AV fistula placement (Right, 03/18/2015); Cholecystectomy (N/A, 06/17/2015); Umbilical hernia repair (N/A, 06/24/2015); Revison of arteriovenous fistula (Right, 01/03/2018); IR Paracentesis (08/20/2018); IR Paracentesis (09/11/2018); and Revison of arteriovenous fistula (Right, 09/16/2018).  No Known Allergies  No current facility-administered medications on file prior to encounter.    Current Outpatient Medications on File Prior to Encounter  Medication Sig  . acetaminophen (TYLENOL) 325 MG tablet Take 2 tablets (650 mg total) by mouth every 6 (six) hours as needed for mild pain or moderate pain.  . Amino Acids-Protein Hydrolys (FEEDING SUPPLEMENT, PRO-STAT SUGAR FREE 64,) LIQD Take 30 mLs by mouth 2 (two) times daily.  Marland Kitchen aspirin EC 81 MG tablet Take 81 mg by mouth daily.   . calcium acetate (PHOSLO) 667 MG capsule Take 1,334 mg by mouth 3 (three) times daily with meals.   . cinacalcet (SENSIPAR) 30 MG tablet Take 30 mg by mouth daily with supper.  Marland Kitchen doxercalciferol (HECTOROL) 4 MCG/2ML injection Inject 1 mL (2 mcg total) into the vein Every Tuesday,Thursday,and Saturday with dialysis.  Marland Kitchen  gabapentin (NEURONTIN) 300 MG capsule Take 1 capsule (300 mg total) by mouth 2 (two) times daily. (Patient taking differently: Take 300 mg by mouth 2 (two) times daily as needed (pain). )  . hydrALAZINE (APRESOLINE) 10 MG tablet Take 10 mg by mouth daily as needed (anxiety).  . metoprolol tartrate (LOPRESSOR) 25 MG tablet Take 1 tablet (25 mg total) by mouth 2 (two) times daily.  . multivitamin (RENA-VIT) TABS tablet Take 1 tablet by mouth at bedtime.  . pantoprazole (PROTONIX) 40 MG tablet Take 40 mg by mouth as needed (acid reflux).   . rosuvastatin (CRESTOR) 20 MG tablet Take 1 tablet (20 mg total) by mouth daily at 6 PM.    FAMILY HISTORY:   His family history includes Diabetes in his mother; Heart disease in his mother.  SOCIAL HISTORY:  He  reports that he has quit smoking. His smoking use included cigarettes. He has a 5.00 pack-year smoking history. He has never used smokeless tobacco. He reports that he does not drink alcohol or use drugs.  REVIEW OF SYSTEMS:    Ferol Luz Jayce Boyko ACNP Maryanna Shape PCCM Pager [phone] till 1 pm If no answer page [phone] 10/25/2018,  9:51 AM ``

## 2018-11-02 NOTE — Progress Notes (Signed)
Assisted tele visit to patient with family member.  Johnella Crumm Ann, RN  

## 2018-11-02 NOTE — ED Notes (Signed)
Chest compressions stopped

## 2018-11-02 NOTE — ED Notes (Signed)
bp 118/93

## 2018-11-02 NOTE — Consult Note (Addendum)
CARDIOLOGY CONSULT NOTE  Patient ID: Eric [name] MRN: [id number] DOB/AGE: Jun 10, 1955 63 y.o.  Admit date: 11/17/2018 Referring Physician  Gaylyn Lambert, NP, Pulmonary critical care Primary Physician:  System, Pcp Not In Reason for Consultation  C. Arrest  HPI:    Eric [name]  is a 63 y.o. male  with CAD with CABG in 1999, Diffuse disease in distal LAD and RCA, patent LIMA to LAD and SVG to OM1 by angiogram in 2015, H/O cardiac arrest S/P AICD implantation with St. Jude defibrillator in 2015 in [place], uncontrolled type 2 diabetes, chronic Hepatitis C and hepatic cirrhosis, hypertension, uncontrolled type 2 diabetes, diastolic heart failure with preserved LVEF by echo in 2016 and moderate pulmonary hypertension, ESRD on HD. Hep C and cirrhosis of the liver with ascites. Patient  moved to [facility] and has recently moved back to [place] in Oct 2019.  "Patient arrived at end of shift for unresponsiveness.  Patient apparently was complained of severe chest pain leg pain and arm pain with EMS he given multiple doses of fentanyl to include 200 mcg.  He apparently went unresponsive on arrival.  On my initial examination patient had a pulse but was difficult to bag secondary to obstruction by his tongue.  Naso-pharyngeal airway was placed without much difficulty making bagging easier.  2mg  narcan without response. Subsequently patient lost pulses.  CPR was initiated.  Secondary to being a dialysis patient I gave him calcium and bicarb for possible hyperkalemia but also epinephrine.  At that time Dr. Vallery Ridge arrived and assumed care.  Please see her note for procedures and further management." Dr. Charlesetta Shanks.  Patient remains intubated, history obtained by chart review.  Past Medical History:  Diagnosis Date  . AICD (automatic cardioverter/defibrillator) present   . Cardiac arrest ([place]) 03/07/2013  . Cardiac arrest due to underlying cardiac condition ([place])   . CHF (congestive heart failure)  ([place])   . Complication of anesthesia    got too much anesthesia and bp dropped very low Feb, 2019  . Edema   . Encounter for fitting or adjustment of implantable cardioverter-defibrillator (ICD)   . ESRD on dialysis [facility])    began HD in 2018  . History of blood transfusion 1950's   "related to pinal menigitis; HAD 16 OPERATIONS TOTAL"  . History of ventricular fibrillation 03/23/2013  . Hyperlipemia   . Hypertension   . Ileus ([place]) 05/2015  . Myocardial infarction ([employer]) 03/2013  . Pain in the chest   . Pneumonia 2016  . Rash and nonspecific skin eruption 06/29/2014  . Scabies infestation 06/29/2014  . Shortness of breath dyspnea   . Type II diabetes mellitus ([place])    type 2  . Ventricular tachycardia [facility])     Past Surgical History:  Procedure Laterality Date  . AV FISTULA PLACEMENT Right 03/18/2015   Procedure: BRACHIOCEPHALIC ARTERIOVENOUS (AV) FISTULA CREATION;  Surgeon: Angelia Mould, MD;  Location: [place];  Service: Vascular;  Laterality: Right;  . BACK SURGERY  1950's   "for spinal menigitis; HAD 16 OPERATIONS TOTAL"  . CARDIAC CATHETERIZATION    . CHOLECYSTECTOMY N/A 06/17/2015   Procedure: LAPAROSCOPIC CHOLECYSTECTOMY;  Surgeon: Coralie Keens, MD;  Location: [place];  Service: General;  Laterality: N/A;  . CORONARY ARTERY BYPASS GRAFT  1999   cabg x4  . 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 HEAD SURGERY  1950'S   "for spinal menigitis; HAD 16 OPERATIONS TOTAL"  . IMPLANTABLE CARDIOVERTER DEFIBRILLATOR  IMPLANT  03/24/2013   STJ single chamber ICD implanted by Dr Caryl Comes for secondary prevention  . IMPLANTABLE CARDIOVERTER DEFIBRILLATOR IMPLANT N/A 03/24/2013   Procedure: IMPLANTABLE CARDIOVERTER DEFIBRILLATOR IMPLANT;  Surgeon: Deboraha Sprang, MD;  Location: [facility] CATH LAB;  Service: Cardiovascular;  Laterality: N/A;  . INSERTION OF DIALYSIS CATHETER N/A 03/13/2015   Procedure: INSERTION OF TUNNELED DIALYSIS CATHETER;  Surgeon: Conrad [place], MD;  Location: [place];  Service: Vascular;  Laterality: N/A;  . IR PARACENTESIS  08/20/2018  . IR PARACENTESIS  09/11/2018  . LEFT HEART CATHETERIZATION WITH CORONARY/GRAFT ANGIOGRAM  03/07/2013   Procedure: LEFT HEART CATHETERIZATION WITH [name];  Surgeon: Clent Demark, MD;  Location: [facility];  Service: Cardiovascular;;  . REVISON OF ARTERIOVENOUS FISTULA Right 01/03/2018   Procedure: REVISION OF ARTERIOVENOUS FISTULA;  Surgeon: Waynetta Sandy, MD;  Location: [place];  Service: Vascular;  Laterality: Right;  . REVISON OF ARTERIOVENOUS FISTULA Right 09/16/2018   Procedure: REVISION PLICATION OF ARTERIOVENOUS FISTULA RIGHT ARM;  Surgeon: Angelia Mould, MD;  Location: [place];  Service: Vascular;  Laterality: Right;  . UMBILICAL HERNIA REPAIR N/A 06/24/2015   Procedure: HERNIA REPAIR UMBILICAL ADULT;  Surgeon: Georganna Skeans, MD;  Location: [place];  Service: General;  Laterality: N/A;    Social History   Socioeconomic History  . Marital status: Married    Spouse name: Not on file  . Number of children: 1  . Years of education: Not on file  . Highest education level: Not on file  Occupational History  . Occupation: retired Art therapist  Social Needs  . Financial resource strain: Not on file  . Food insecurity    Worry: Not on file    Inability: Not on file  . Transportation needs    Medical: Not on file    Non-medical: Not on file  Tobacco Use  . Smoking status: Former Smoker    Packs/day: 1.00    Years: 5.00    Pack years: 5.00    Types: Cigarettes  . Smokeless tobacco: Never Used  Substance and Sexual Activity  . Alcohol use: No    Alcohol/week: 0.0 standard drinks  . Drug use: No  . Sexual activity: Not Currently  Lifestyle  . Physical activity    Days per week: Not on file    Minutes per session: Not on file  . Stress: Not on file  Relationships  . Social Herbalist on phone: Not on file    Gets together: Not on  file    Attends religious service: Not on file    Active member of club or organization: Not on file    Attends meetings of clubs or organizations: Not on file    Relationship status: Not on file  . Intimate partner violence    Fear of current or ex partner: Not on file    Emotionally abused: Not on file    Physically abused: Not on file    Forced sexual activity: Not on file  Other Topics Concern  . Not on file  Social History Narrative   Lives at home w/ his wife and daughter   Right-handed   Caffeine: none     No intake/output data recorded.  No intake/output data recorded.   ROS  Review of Systems  Unable to perform ROS: intubated    Objective  Blood pressure (!) 152/95, pulse 70, temperature (!) 96 F (35.6 C), temperature source Temporal, resp. rate 19,  height 5\' 6"  (1.676 m), weight 85.7 kg, SpO2 99 %. Body mass index is 30.51 kg/m. Vitals with BMI 11/01/2018 11/18/2018 10/26/2018  Height - - -  Weight - - -  BMI - - -  Systolic 315 176 160  Diastolic 95 95 96  Pulse 70 70 70    Blood pressure (!) 152/95, pulse 70, temperature (!) 96 F (35.6 C), temperature source Temporal, resp. rate 19, height 5\' 6"  (1.676 m), weight 85.7 kg, SpO2 99 %. Body mass index is 30.51 kg/m.  Physical Exam  Constitutional: Vital signs are normal. He is intubated.  Neck: Neck supple.  Cardiovascular: Normal rate, regular rhythm and normal heart sounds.  Pulses:      Carotid pulses are on the right side with bruit and on the left side with bruit.      Radial pulses are 2+ on the right side and 2+ on the left side.       Dorsalis pedis pulses are 0 on the right side and 0 on the left side.       Posterior tibial pulses are 0 on the right side and 0 on the left side.  right upper arm fistula for hemodialysis noted.  There is 2+ bilateral ankle edema.  Chronic ischemic changes with thin skin and pigmentation noted.  Pulmonary/Chest: Breath sounds normal. He is intubated.  Abdominal:  He exhibits distension.  Skin: Skin is warm and dry.    Radiology: Ct Head Wo Contrast  Result Date: 11/18/2018 CLINICAL DATA:  Altered level of consciousness, chest pain EXAM: CT HEAD WITHOUT CONTRAST TECHNIQUE: Contiguous axial images were obtained from the base of the skull through the vertex without intravenous contrast. COMPARISON:  03/18/2015 FINDINGS: Brain: Chronic ventriculomegaly, largely stable. No new midline shift. No evidence of acute infarction, hemorrhage, extra-axial collection, or mass. Vascular: Atherosclerotic and physiologic intracranial calcifications. Skull: Normal. Negative for fracture or focal lesion. Sinuses/Orbits: Mucoperiosteal thickening in the right maxillary sinus. Fluid level in the sphenoid sinus. Other: None IMPRESSION: 1. Negative for bleed or other acute intracranial process. 2. Stable chronic ventriculomegaly. 3. Right maxillary and sphenoid sinus disease. Electronically Signed   By: Lucrezia Europe M.D.   On: 11/18/2018 09:02   Dg Chest Portable 1 View  Result Date: 10/27/2018 CLINICAL DATA:  63 year old male status post CPR EXAM: PORTABLE CHEST 1 VIEW COMPARISON:  Prior chest x-ray 10/15/2018 FINDINGS: Patient is intubated. The tip of the endotracheal tube is 4.1 cm above the carina. A left subclavian approach cardiac rhythm maintenance device remains in unchanged position. The lead terminates overlying the right ventricle. Inspiratory volumes are low. Stable cardiomegaly. Atherosclerotic calcifications again noted in the transverse aorta. The low inspiratory volumes results in crowding of pulmonary vasculature. Stable bronchitic changes. No large effusion, pulmonary edema or pneumothorax. No focal airspace consolidation. No acute osseous abnormality. IMPRESSION: 1. Low inspiratory volumes without evidence of acute cardiopulmonary process. 2. The tip of the endotracheal tube is 4 cm above the carina. Electronically Signed   By: Jacqulynn Cadet M.D.   On: 11/14/2018  08:04   Ct Angio Chest/abd/pel For Dissection W And/or W/wo  Result Date: 10/31/2018 CLINICAL DATA:  63 year old male with chest pain, cardiopulmonary arrest. Additional clinical history includes HCV cirrhosis and end-stage renal disease on hemodialysis. EXAM: CT ANGIOGRAPHY CHEST, ABDOMEN AND PELVIS TECHNIQUE: Multidetector CT imaging through the chest, abdomen and pelvis was performed using the standard protocol during bolus administration of intravenous contrast. Multiplanar reconstructed images and MIPs were obtained and reviewed to  evaluate the vascular anatomy. CONTRAST:  121mL OMNIPAQUE IOHEXOL 350 MG/ML SOLN COMPARISON:  Prior CT abdomen and pelvis 08/20/2018 FINDINGS: CTA CHEST FINDINGS Cardiovascular: Adequate opacification of the thoracic aorta and pulmonary arteries. 2 vessel aortic arch. The right brachiocephalic and left common carotid artery share a common origin. Scattered heterogeneous atherosclerotic plaque. No evidence of aneurysm. The aortic root is unremarkable. Patient is status post multivessel CABG. The visualized grafts appear patent. Calcifications present within the native coronary arteries. The main pulmonary artery is normal in caliber. Evaluation of the segmental and subsegmental arteries limited by respiratory motion artifact. No evidence of filling defect to suggest acute pulmonary embolus. Cardiomegaly with right heart enlargement. Cardiac rhythm maintenance device. The lead terminates in the right ventricle. No significant pericardial effusion. Mediastinum/Nodes: No mediastinal mass or suspicious adenopathy. Unremarkable thyroid gland. The esophagus conveys a gastric tube in the stomach. Lungs/Pleura: Small layering right pleural effusion. Trace layering left pleural effusion. Associated atelectasis. Greater than expected volume of atelectasis in the posterior right upper lobe. There is some associated bronchial wall thickening. This may represent small volume mucous plugging  versus an early infectious/inflammatory process such as bronchopneumonia. 6 mm subpleural pulmonary nodule in the periphery of the right middle lobe remain unchanged and is likely benign. No evidence of pneumothorax. Musculoskeletal: No acute fracture or aggressive appearing lytic or blastic osseous lesion. No rib fractures visualized. Healed median sternotomy. Review of the MIP images confirms the above findings. CTA ABDOMEN AND PELVIS FINDINGS VASCULAR Aorta: Normal in caliber. Mild atherosclerotic plaque. No evidence of dissection or penetrating ulcer. Celiac: Mild median arcuate ligament compression. No evidence of aneurysm or dissection. SMA: Patent without evidence of aneurysm, dissection, vasculitis or significant stenosis. Renals: Solitary right kidney. The renal artery is patent and unremarkable. No evidence of aneurysm or fibromuscular dysplasia. IMA: Patent without evidence of aneurysm, dissection, vasculitis or significant stenosis. Inflow: Scattered atherosclerotic plaque without significant stenosis or occlusion. No evidence of aneurysm or dissection. Veins: No obvious venous abnormality within the limitations of this arterial phase study. Review of the MIP images confirms the above findings. NON-VASCULAR Hepatobiliary: Hepatic cirrhosis. Mildly dilated intrahepatic IVC and hepatic veins suggests elevated right heart pressures. The gallbladder is surgically absent. No arterially enhancing liver lesion. Pancreas: Unremarkable. No pancreatic ductal dilatation or surrounding inflammatory changes. Spleen: Normal in size without focal abnormality. Adrenals/Urinary Tract: Mild adreniform thickening bilaterally. No discrete nodules. Solitary right kidney. No evidence of hydronephrosis, nephrolithiasis or enhancing renal mass. The left kidney is visualized but is severely atretic. Unremarkable ureters and bladder. Stomach/Bowel: Colonic diverticular disease without CT evidence of active inflammation. No  evidence of obstruction or focal bowel wall thickening. Normal appendix in the right lower quadrant. The terminal ileum is unremarkable. Lymphatic: No suspicious lymphadenopathy. Reproductive: Prostate gland is unremarkable. Other: Moderate to large volume peritoneal ascites. Additionally, the greater omentum is diffusely edematous. With internal fluid densities. Musculoskeletal: No acute fracture or aggressive appearing lytic or blastic osseous lesion. Review of the MIP images confirms the above findings. IMPRESSION: CTA CHEST 1. No evidence of aortic dissection, pulmonary embolism or other acute vascular abnormality. 2. Cardiomegaly with evidence of elevated right heart pressures. 3. Advanced native coronary artery disease with surgical changes of prior multivessel CABG. The visualized portions of the bypass grafts appear patent. 4. Small right and trace left pleural effusions. 5. Greater than expected atelectasis in the dependent aspect of the right upper lobe. This may be secondary to recent cardiac arrest, or, in the appropriate clinical setting, reflect early  bronchopneumonia. CTA ABD/PELVIS 1. No evidence of abdominal aortic aneurysm, dissection or other acute vascular abnormality. 2. Incidental note is made of mild median arcuate ligament compression the origin of the celiac artery. 3.  Aortic Atherosclerosis (ICD10-170.0). 4. Hepatic cirrhosis with moderate to large volume ascites. 5. Colonic diverticular disease without CT evidence of active inflammation. Signed, Criselda Peaches, MD, [facility] Radiology Electronically Signed   By: Jacqulynn Cadet M.D.   On: 11/18/2018 09:13    Laboratory examination:   Recent Labs    09/22/18 0419 10/15/18 1155 11/12/2018 0727 11/23/2018 0753 11/15/2018 0912  NA 131* 134* 134* 131* 131*  K 4.7 3.6 3.5 4.1 3.1*  CL 92* 93* 92* 92*  --   CO2 28 29  --  23  --   GLUCOSE 97 91 89 128*  --   BUN 41* 27*  43* 37*  --   CREATININE 6.44* 6.56* 7.20* 7.24*  --   CALCIUM 8.0* 9.0  --  8.7*  --   GFRNONAA 8* 8*  --  7*  --   GFRAA 10* 9*  --  8*  --    CMP Latest Ref Rng & Units 11/10/2018 11/12/2018 10/25/2018  Glucose 70 - 99 mg/dL - 128(H) 89  BUN 8 - 23 mg/dL - 37(H) 43(H)  Creatinine 0.61 - 1.24 mg/dL - 7.24(H) 7.20(H)  Sodium 135 - 145 mmol/L 131(L) 131(L) 134(L)  Potassium 3.5 - 5.1 mmol/L 3.1(L) 4.1 3.5  Chloride 98 - 111 mmol/L - 92(L) 92(L)  CO2 22 - 32 mmol/L - 23 -  Calcium 8.9 - 10.3 mg/dL - 8.7(L) -  Total Protein 6.5 - 8.1 g/dL - 7.4 -  Total Bilirubin 0.3 - 1.2 mg/dL - 1.0 -  Alkaline Phos 38 - 126 U/L - 52 -  AST 15 - 41 U/L - 31 -  ALT 0 - 44 U/L - 15 -   CBC Latest Ref Rng & Units 11/01/2018 10/27/2018 11/10/2018  WBC 4.0 - 10.5 K/uL - 6.8 -  Hemoglobin 13.0 - 17.0 g/dL 10.2(L) 9.6(L) 12.6(L)  Hematocrit 39.0 - 52.0 % 30.0(L) 31.6(L) 37.0(L)  Platelets 150 - 400 K/uL - 58(L) -   Lipid Panel     Component Value Date/Time   CHOL 148 01/01/2018 1652   TRIG 113 01/01/2018 1652   HDL 32 (L) 01/01/2018 1652   CHOLHDL 4.6 01/01/2018 1652   VLDL 23 01/01/2018 1652   LDLCALC 93 01/01/2018 1652   HEMOGLOBIN A1C Lab Results  Component Value Date   HGBA1C 4.7 (L) 01/01/2018   MPG 88.19 01/01/2018   TSH No results for input(s): TSH in the last 8760 hours.   Cardiac Panel (last 3 results) Recent Labs    11/08/2018 0753 11/10/2018 0946  CKTOTAL 116  --   TROPONINIHS 40* 135*    Medications   Prior to Admission medications   Medication Sig Start Date End Date Taking? Authorizing Provider  acetaminophen (TYLENOL) 325 MG tablet Take 2 tablets (650 mg total) by mouth every 6 (six) hours as needed for mild pain or moderate pain. 07/01/15   Smiley Houseman, MD  Amino Acids-Protein Hydrolys (FEEDING SUPPLEMENT, PRO-STAT SUGAR FREE 64,) LIQD Take 30 mLs by mouth 2 (two) times daily. 09/23/18   Dana Allan I, MD  aspirin EC 81 MG tablet Take 81 mg by mouth daily.      [provider]  calcium acetate (PHOSLO) 667 MG capsule Take 1,334 mg by mouth  3 (three) times daily with meals.     [provider]  cinacalcet (SENSIPAR) 30 MG tablet Take 30 mg by mouth daily with supper.    [provider]  doxercalciferol (HECTOROL) 4 MCG/2ML injection Inject 1 mL (2 mcg total) into the vein Every Tuesday,Thursday,and Saturday with dialysis. 09/24/18   Bonnell Public, MD  gabapentin (NEURONTIN) 300 MG capsule Take 1 capsule (300 mg total) by mouth 2 (two) times daily. Patient taking differently: Take 300 mg by mouth 2 (two) times daily as needed (pain).  04/01/18   Miquel Dunn, NP  hydrALAZINE (APRESOLINE) 10 MG tablet Take 10 mg by mouth daily as needed (anxiety).    [provider]  metoprolol tartrate (LOPRESSOR) 25 MG tablet Take 1 tablet (25 mg total) by mouth 2 (two) times daily. 05/29/18 10/15/18  Adrian Prows, MD  multivitamin (RENA-VIT) TABS tablet Take 1 tablet by mouth at bedtime. 07/01/15   Smiley Houseman, MD  pantoprazole (PROTONIX) 40 MG tablet Take 40 mg by mouth as needed (acid reflux).     [provider]  rosuvastatin (CRESTOR) 20 MG tablet Take 1 tablet (20 mg total) by mouth daily at 6 PM. 01/05/18   Kayleen Memos, DO     Current Outpatient Medications  Medication Instructions  . acetaminophen (TYLENOL) 650 mg, Oral, Every 6 hours PRN  . Amino Acids-Protein Hydrolys (FEEDING SUPPLEMENT, PRO-STAT SUGAR FREE 64,) LIQD 30 mLs, Oral, 2 times daily  . aspirin EC 81 mg, Oral, Daily  . calcium acetate (PHOSLO) 1,334 mg, Oral, 3 times daily with meals  . cinacalcet (SENSIPAR) 30 mg, Oral, Daily with supper  . doxercalciferol (HECTOROL) 2 mcg, Intravenous, Every T-Th-Sa (Hemodialysis)  . gabapentin (NEURONTIN) 300 mg, Oral, 2 times daily  . hydrALAZINE (APRESOLINE) 10 mg, Oral, Daily PRN  . metoprolol tartrate (LOPRESSOR) 25 mg, Oral, 2 times daily  . multivitamin (RENA-VIT) TABS tablet 1 tablet,  Oral, Daily at bedtime  . pantoprazole (PROTONIX) 40 mg, Oral, As needed  . rosuvastatin (CRESTOR) 20 mg, Oral, Daily-1800    Cardiac Studies:   Coronary angio 02/24/2013: Diffuse distal LAD disease, RCA stenosis not felt to be amenable for PCI, patent LIMA to LAD and SVG to OM1.  Echocardiogram 02/20/2018: Left ventricle cavity is normal in size. Mild concentric hypertrophy of the left ventricle. Normal global wall motion. Doppler evidence of grade I (impaired) diastolic dysfunction, normal LAP. Calculated EF 61%. Moderate (Grade II) mitral regurgitation. Mild tricuspid regurgitation. Inadequate TR jet to estimate PA systolic pressure. Normal right atrial pressure.  Lower extremity arterial duplex 02/20/2018: No hemodynamically significant stenoses are identified in the right lower extremity arterial system. Moderate velocity increase at the left mid posterior tibial artery suggests >50% stenosis. Severely abnormal waveforms of the right ankle. Moderately abnormal waveforms of the left ankle. Non-compressible ABI bilateral due to medial calcinosis.  Scheduled Remote ICD check  08.31.20: VVI ICD ICM.  No VHR episodes. Normal function. CorVue impedance monitoring shows a recent dip below the baseline and now returned to baseline. Battery longevity is 3.9 years. RV pacing is 91 %.  EKG 11/01/2018: Ventricularly paced rhythm.  No further analysis.  EKG during resuscitation reveals frequent PVCs, ventricular couplets.  Assessment   1.  Respiratory arrest, details of which are very confusing, may be related to 200 mcg of fentanyl but reversal agent with Narcan did not make any difference, 2.  Brief asystole needing CPR, may be related to underlying hypoxemia.  Patient had one episode  of what appears to be VF and discharge of defibrillator during resuscitation. 3. CAD of the native vessel with chronic stable angina pectoris. 4.  Chronic diastolic heart failure 5.  End-stage renal disease on  hemodialysis 6.  Cirrhosis of liver with ascites. 7. ICD "St. Jude" in situ. No recent alerts noted remotely.   Recommendations:   Events surrounding his cardiac arrest are very confusing.  I cannot completely explain his diffuse generalized pains, chest pain, leg pain arm pain that he called the EMS for, no relief with nitroglycerin.  Closely evaluation of the EKGs during resuscitation do not reveal any ST elevations that are clear-cut for STEMI, post resuscitation EKG is V paced.  Would recommend continue observation for now with given multiple medical comorbidity as dictated above, once he is extubated we can determine whether he needs cardiac catheterization.  Will obtain echocardiogram, trend his serum troponins, minimal elevation noted and difficult to quantify in view of CPR, ESRD. Will cycle two more. Could be demand ischemia due to hypoxemia and respiratory arrest.   Adrian Prows, MD, [facility] 11/01/2018, 11:06 AM [facility] [place] Pager: [phone] Office: [phone] If no answer Cell [phone]

## 2018-11-02 NOTE — Progress Notes (Signed)
Critical ABG values given to Dr. Carlis Abbott.  Vent VT changed to 7 cc's/kg ibw.

## 2018-11-02 NOTE — ED Notes (Signed)
amadrione drip

## 2018-11-02 NOTE — ED Provider Notes (Signed)
[facility] EMERGENCY DEPARTMENT Provider Note   CSN: [id number] Arrival date & time: 10/29/2018  7654     History   Chief Complaint Chief Complaint  Patient presents with  . unresponsive    HPI Eric [name] is a 63 y.o. male.     HPI EMS arrived to patient's home for chief complaint of severe pain.  They report on arrival patient was complaining of severe pain throughout his chest as well as most of his body.  He was complaining of pain throughout his legs.  They report he was very agitated with severe pain.  They did have normotensive blood pressure and CBG of approximately 100 from initial assessment.  Patient does have chronic renal failure and is on dialysis.  On route, he was given 2 nitroglycerin without any improvement in pain.  The fentanyl was then given in 50 mcg increments.  Patient was not getting pain relief and and complaining of persisting severe pain.  He was given a total of 200 mcg just prior to arriving at the ambulance bay.  Patient went unresponsive and lost pulses.  Narcan was administered with no change.  Pulses assessed in the room and patient found to be pulseless.  CPR initiated.  Cannot obtain any history from patient.  Additional history obtained from EMR for severe comorbid illnesses of ESRD on dialysis, diabetes, heart failure with AICD and history of VT and MI.  08: 82 I was able to have conversation with the patient's daughter and wife.  Reportedly he frequently has problems with pain.  Usually it improves in the evening when he takes his medications.  This morning he awakened and it did seem to be worse and different.  His daughter reports that he kind of seem to be going "in and out of it".  They did take blood pressures at home when she has described essentially normal systolic blood pressures.  She does report that he had aspirin this morning which was given as well as his Neurontin and Tylenol.  She also reports that at one point when  they were in [place], he "went out" getting an elective procedure for his fistula.  She reports in the end, the problem was that he had so much abdominal fluid, "they took 8 gallons off", that that was what caused him to go unresponsive and have significant medical problems.  She reports his abdomen has been looking very distended today. Past Medical History:  Diagnosis Date  . AICD (automatic cardioverter/defibrillator) present   . Cardiac arrest ([place]) 03/07/2013  . Cardiac arrest due to underlying cardiac condition ([place])   . CHF (congestive heart failure) ([place])   . Complication of anesthesia    got too much anesthesia and bp dropped very low Feb, 2019  . Edema   . Encounter for fitting or adjustment of implantable cardioverter-defibrillator (ICD)   . ESRD on dialysis [facility])    began HD in 2018  . History of blood transfusion 1950's   "related to pinal menigitis; HAD 16 OPERATIONS TOTAL"  . History of ventricular fibrillation 03/23/2013  . Hyperlipemia   . Hypertension   . Ileus ([place]) 05/2015  . Myocardial infarction ([place]) 03/2013  . Pain in the chest   . Pneumonia 2016  . Rash and nonspecific skin eruption 06/29/2014  . Scabies infestation 06/29/2014  . Shortness of breath dyspnea   . Type II diabetes mellitus ([place])    type 2  . Ventricular tachycardia [facility])     Patient  Active Problem List   Diagnosis Date Noted  . Atypical chest pain 07/30/2018  . S/P CABG (coronary artery bypass graft) 04/01/2018  . Claudication ([place]) 04/01/2018  . Chronic diastolic (congestive) heart failure ([place]) 04/01/2018  . Paresthesia 12/07/2015  . BPH (benign prostatic hyperplasia)   . Atherosclerosis of native coronary artery of native heart without angina pectoris 10/01/2015  . Chronic systolic (congestive) heart failure ([place]) 10/01/2015  . Encounter for fitting or adjustment of implantable cardioverter-defibrillator (ICD)   . Ileus, postoperative ([place])   . Impaired nasal gastric feeding tube   .  Impaired nasogastric feeding tube   . Ileus ([place])   . ESRD (end stage renal disease) ([place])   . Gallstones   . Gallbladder calculus 06/15/2015  . Numbness   . RUQ abdominal pain   . Diabetes mellitus with complication ([place])   . Hypertensive urgency 08/03/2014  . Controlled type 2 diabetes with neuropathy ([place]) 08/03/2014  . Obesity (BMI 30-39.9) 08/03/2014  . Bilateral leg pain 08/03/2014  . DM (diabetes mellitus), type 2 with renal complications ([place]) 83/66/2947  . Essential hypertension, benign 06/29/2014  . Hepatic cirrhosis ([place]) 11/28/2013  . Hepatitis C antibody test positive 11/28/2013  . Cirrhosis ([place])   . Type 2 diabetes mellitus with hyperglycemia ([place])   . Diabetes mellitus with hypoglycemia ([place]) 04/29/2013  . Hyperlipemia   . ICD - Single chamber ICD-St.Jude 1411-36C Ellipse VR-CorVue-Merlin (DOI: 03/24/2013) in situ 03/24/2013  . History of ventricular fibrillation 03/23/2013  . Ventricular fibrillation ([place]) 03/08/2013  . Seizure ([place]) 03/07/2013    Past Surgical History:  Procedure Laterality Date  . AV FISTULA PLACEMENT Right 03/18/2015   Procedure: BRACHIOCEPHALIC ARTERIOVENOUS (AV) FISTULA CREATION;  Surgeon: Angelia Mould, MD;  Location: [facility];  Service: Vascular;  Laterality: Right;  . BACK SURGERY  1950's   "for spinal menigitis; HAD 16 OPERATIONS TOTAL"  . CARDIAC CATHETERIZATION    . CHOLECYSTECTOMY N/A 06/17/2015   Procedure: LAPAROSCOPIC CHOLECYSTECTOMY;  Surgeon: Coralie Keens, MD;  Location: [facility];  Service: General;  Laterality: N/A;  . CORONARY ARTERY BYPASS GRAFT  1999   cabg x4  . 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 HEAD SURGERY  1950'S   "for spinal menigitis; HAD 16 OPERATIONS TOTAL"  . IMPLANTABLE CARDIOVERTER DEFIBRILLATOR IMPLANT  03/24/2013   STJ single chamber ICD implanted by Dr Caryl Comes for secondary prevention  . IMPLANTABLE CARDIOVERTER DEFIBRILLATOR IMPLANT N/A 03/24/2013   Procedure:  IMPLANTABLE CARDIOVERTER DEFIBRILLATOR IMPLANT;  Surgeon: Deboraha Sprang, MD;  Location: [facility] CATH LAB;  Service: Cardiovascular;  Laterality: N/A;  . INSERTION OF DIALYSIS CATHETER N/A 03/13/2015   Procedure: INSERTION OF TUNNELED DIALYSIS CATHETER;  Surgeon: Conrad [place], MD;  Location: [facility];  Service: Vascular;  Laterality: N/A;  . IR PARACENTESIS  08/20/2018  . IR PARACENTESIS  09/11/2018  . LEFT HEART CATHETERIZATION WITH CORONARY/GRAFT ANGIOGRAM  03/07/2013   Procedure: LEFT HEART CATHETERIZATION WITH [name];  Surgeon: Clent Demark, MD;  Location: [facility] LAB;  Service: Cardiovascular;;  . REVISON OF ARTERIOVENOUS FISTULA Right 01/03/2018   Procedure: REVISION OF ARTERIOVENOUS FISTULA;  Surgeon: Waynetta Sandy, MD;  Location: [facility];  Service: Vascular;  Laterality: Right;  . REVISON OF ARTERIOVENOUS FISTULA Right 09/16/2018   Procedure: REVISION PLICATION OF ARTERIOVENOUS FISTULA RIGHT ARM;  Surgeon: Angelia Mould, MD;  Location: [facility];  Service: Vascular;  Laterality: Right;  . UMBILICAL HERNIA REPAIR N/A 06/24/2015   Procedure:  HERNIA REPAIR UMBILICAL ADULT;  Surgeon: Georganna Skeans, MD;  Location: [facility];  Service: General;  Laterality: N/A;        Home Medications    Prior to Admission medications   Medication Sig Start Date End Date Taking? Authorizing Provider  acetaminophen (TYLENOL) 325 MG tablet Take 2 tablets (650 mg total) by mouth every 6 (six) hours as needed for mild pain or moderate pain. 07/01/15   Smiley Houseman, MD  Amino Acids-Protein Hydrolys (FEEDING SUPPLEMENT, PRO-STAT SUGAR FREE 64,) LIQD Take 30 mLs by mouth 2 (two) times daily. 09/23/18   Dana Allan I, MD  aspirin EC 81 MG tablet Take 81 mg by mouth daily.     [provider]  calcium acetate (PHOSLO) 667 MG capsule Take 1,334 mg by mouth 3 (three) times daily with meals.     [provider]  cinacalcet (SENSIPAR) 30 MG tablet Take 30 mg by mouth  daily with supper.    [provider]  doxercalciferol (HECTOROL) 4 MCG/2ML injection Inject 1 mL (2 mcg total) into the vein Every Tuesday,Thursday,and Saturday with dialysis. 09/24/18   Bonnell Public, MD  gabapentin (NEURONTIN) 300 MG capsule Take 1 capsule (300 mg total) by mouth 2 (two) times daily. Patient taking differently: Take 300 mg by mouth 2 (two) times daily as needed (pain).  04/01/18   Miquel Dunn, NP  hydrALAZINE (APRESOLINE) 10 MG tablet Take 10 mg by mouth daily as needed (anxiety).    [provider]  metoprolol tartrate (LOPRESSOR) 25 MG tablet Take 1 tablet (25 mg total) by mouth 2 (two) times daily. 05/29/18 10/15/18  Adrian Prows, MD  multivitamin (RENA-VIT) TABS tablet Take 1 tablet by mouth at bedtime. 07/01/15   Smiley Houseman, MD  pantoprazole (PROTONIX) 40 MG tablet Take 40 mg by mouth as needed (acid reflux).     [provider]  rosuvastatin (CRESTOR) 20 MG tablet Take 1 tablet (20 mg total) by mouth daily at 6 PM. 01/05/18   Kayleen Memos, DO    Family History Family History  Problem Relation Age of Onset  . Heart disease Mother   . Diabetes Mother     Social History Social History   Tobacco Use  . Smoking status: Former Smoker    Packs/day: 1.00    Years: 5.00    Pack years: 5.00    Types: Cigarettes  . Smokeless tobacco: Never Used  Substance Use Topics  . Alcohol use: No    Alcohol/week: 0.0 standard drinks  . Drug use: No     Allergies   Patient has no known allergies.   Review of Systems Review of Systems Level 5 caveat cannot obtain review of systems due to patient condition.  Physical Exam Updated Vital Signs BP 91/69   Pulse 70   Resp (!) 22   SpO2 100%   Physical Exam Constitutional:      Comments: Patient is unresponsive.  He is actively being bagged.  Warm.  No pain response or spontaneous respirations.  HENT:     Head: Normocephalic and atraumatic.     Mouth/Throat:     Comments:  Oral airway has already been placed.  Small amount of blood in the mouth from airway placement but otherwise no secretions or pooling secretions or emesis. Eyes:     Comments: Pupils are symmetric approximately 3 to 4 mm.  Cardiovascular:     Comments: Initial check no pulses present. Pulmonary:  Comments: No spontaneous respirations.  Bilateral breath sounds with bagging. Abdominal:     Comments: Abdomen is distended.  Mildly firm.  Genitourinary:    Penis: Normal.   Musculoskeletal:     Comments: Lower extremities show changes of chronic venous stasis.  Skin thinning.  Approximately 1+ edema bilaterally.  Skin:    General: Skin is warm and dry.  Neurological:     Comments: Completely unresponsive.  No pain response.     ED Treatments / Results  Labs (all labs ordered are listed, but only abnormal results are displayed) Labs Reviewed  CBG MONITORING, ED - Abnormal; Notable for the following components:      Result Value   Glucose-Capillary 65 (*)    All other components within normal limits  I-STAT CHEM 8, ED - Abnormal; Notable for the following components:   Sodium 134 (*)    Chloride 92 (*)    BUN 43 (*)    Creatinine, Ser 7.20 (*)    Calcium, Ion 1.10 (*)    Hemoglobin 12.6 (*)    HCT 37.0 (*)    All other components within normal limits  CULTURE, BLOOD (ROUTINE X 2)  CULTURE, BLOOD (ROUTINE X 2)  SARS CORONAVIRUS 2 BY RT PCR (HOSPITAL ORDER, [facility])  ETHANOL  LACTIC ACID, PLASMA  LACTIC ACID, PLASMA  CBC WITH DIFFERENTIAL/PLATELET  COMPREHENSIVE METABOLIC PANEL  MAGNESIUM  PHOSPHORUS  CK  CBC WITH DIFFERENTIAL/PLATELET  PROTIME-INR  I-STAT ARTERIAL BLOOD GAS, ED  TROPONIN I (HIGH SENSITIVITY)    EKG EKG Interpretation  Date/Time:  Saturday November 02 2018 07:28:12 EDT Ventricular Rate:  70 PR Interval:    QRS Duration: 195 QT Interval:  539 QTC Calculation: 582 R Axis:   -39 Text Interpretation:  ventricular  paced. Confirmed by Charlesetta Shanks [phone]) on 11/06/2018 8:01:20 AM   Radiology No results found.  Procedures Procedures (including critical care time) CRITICAL CARE Performed by: Charlesetta Shanks   Total critical care time: 60 minutes  Critical care time was exclusive of separately billable procedures and treating other patients.  Critical care was necessary to treat or prevent imminent or life-threatening deterioration.  Critical care was time spent personally by me on the following activities: development of treatment plan with patient and/or surrogate as well as nursing, discussions with consultants, evaluation of patient's response to treatment, examination of patient, obtaining history from patient or surrogate, ordering and performing treatments and interventions, ordering and review of laboratory studies, ordering and review of radiographic studies, pulse oximetry and re-evaluation of patient's condition. Medications Ordered in ED Medications  naloxone (NARCAN) 2 MG/2ML injection (has no administration in time range)  EPINEPHrine (ADRENALIN) 1 MG/10ML injection (1 mg Intravenous Given 11/01/2018 0702)     Initial Impression / Assessment and Plan / ED Course  I have reviewed the triage vital signs and the nursing notes.  Pertinent labs & imaging results that were available during my care of the patient were reviewed by me and considered in my medical decision making (see chart for details).  Clinical Course as of Nov 01 841  Sat Nov 02, 2018  0754 Consult: Intensivist.  Will see in the emergency department for admission. Consult: Dr. Matthew Folks cardiology.   [MP]  0830 Consult: Reviewed with Dr. Einar Gip.  This is patient's primary cardiologist who will do cardiac consultation and management.   [MP]  [phone] Family updated.  I reviewed current condition and treatment with Starlyn Skeans [phone]) this is the patient's daughter.  She is currently with his wife and is also  informing her.   [MP]    Clinical Course User Index [MP] Charlesetta Shanks, MD      Patient presented unresponsive and pulseless.  Resuscitation immediately initiated.  See resuscitation notes.  Patient did regain pulses.  After initially regaining pulses there was a period of loss of pulses and a course V. Fib rhythm that was shocked by the patient's defibrillator.  There was not recurrence of V. fib or V. tach.  Patient ultimately between a paced rhythm and occasionally a native rhythm narrow complex.  Amiodarone bolus and drip initiated.  At this time monitoring blood pressures and proceeding with diagnostic evaluation.  Consultations to intensivist and cardiology have been placed.  Final Clinical Impressions(s) / ED Diagnoses   Final diagnoses:  Cardiac arrest [facility])  Severe comorbid illness    ED Discharge Orders    None       Charlesetta Shanks, MD 11/04/18 [phone]

## 2018-11-02 NOTE — ED Notes (Signed)
bp 106/74 p70

## 2018-11-02 NOTE — Progress Notes (Signed)
Echocardiogram 2D Echocardiogram has been performed.  Oneal Deputy Janille Draughon 10/29/2018, 2:17 PM

## 2018-11-02 NOTE — ED Notes (Signed)
Port chest xray 

## 2018-11-02 NOTE — Progress Notes (Signed)
RT note: Rt and RN transported patient from ED to 2H05. Vital signs stable through out.

## 2018-11-02 NOTE — ED Notes (Signed)
Calcium chloride iv bristojet  2nd epine bristojet sod bicard bristopjet iv kare newnam rn

## 2018-11-02 NOTE — ED Triage Notes (Signed)
In by ems chest pain  Pt given 200mg  of fentanyl  And the pt  Went out bare;y breathing on arrival  narcan2mg  given iv on arrival

## 2018-11-02 NOTE — ED Notes (Signed)
amadaroine 300mg  bolus iv  Dorothea Ogle

## 2018-11-02 NOTE — Progress Notes (Signed)
EEG complete - results pending 

## 2018-11-02 NOTE — Plan of Care (Signed)
Admitted from  ED post arrest AICD  fire vtach. CPR. GCS 3 initially 36 degree cool started, started awaking and following simple commands. Fentanyl drip started. When started cooling.  Maintain MP MAP 80 for best perfusion Potential for poor air exchange related to abdominal cavity fluid pressing on thorax , causing pressure, shortness of breath.  Potential for bleeding related to thrombocytopenia observe for bleeding, , be cautious when turning, safety measures if needed if patient awakens, and attempts to get oob.

## 2018-11-02 NOTE — ED Notes (Signed)
bp 96/74 p 77  p 100 r 27

## 2018-11-02 NOTE — Progress Notes (Signed)
The chaplain visited with the patient's family after being consulted by the ED nurse.  The wife and other family member were distraught at the medical condition of their loved one.  The chaplain also visited the family again in support of a "Cold Cool". The chaplain was present for the family as their clergy prayed for them over the phone.  The chaplain will pass on the information to the nighttime on-call Chaplain.   Brion Aliment Chaplain Resident For questions concerning this note please contact me by pager [phone]

## 2018-11-02 NOTE — ED Notes (Signed)
ED TO INPATIENT HANDOFF REPORT  ED Nurse Name and Phone #: Joellen Jersey [phone]  S Name/Age/Gender Eric [name] 63 y.o. male Room/Bed: TRAAC/TRAAC  Code Status   Code Status: Full Code  Home/SNF/Other Home Patient oriented to:  Is this baseline? No      Chief Complaint sick  Triage Note In by ems chest pain  Pt given 200mg  of fentanyl  And the pt  Went out bare;y breathing on arrival  narcan2mg  given iv on arrival   Allergies No Known Allergies  Level of Care/Admitting Diagnosis ED Disposition    ED Disposition Condition [place]: [place] [[id number]]  Level of Care: ICU [6]  Covid Evaluation: Confirmed COVID Negative  Diagnosis: Cardiac arrest due to underlying cardiac condition [facility]) [[id number]]  Admitting Physician: Sherron Monday  Attending Physician: PCCM, MD [phone]  Estimated length of stay: 3 - 4 days  Certification:: I certify this patient will need inpatient services for at least 2 midnights  PT Class (Do Not Modify): Inpatient [101]  PT Acc Code (Do Not Modify): Private [1]       B Medical/Surgery History Past Medical History:  Diagnosis Date  . AICD (automatic cardioverter/defibrillator) present   . Cardiac arrest ([place]) 03/07/2013  . Cardiac arrest due to underlying cardiac condition ([place])   . CHF (congestive heart failure) ([place])   . Complication of anesthesia    got too much anesthesia and bp dropped very low Feb, 2019  . Edema   . Encounter for fitting or adjustment of implantable cardioverter-defibrillator (ICD)   . ESRD on dialysis [facility])    began HD in 2018  . History of blood transfusion 1950's   "related to pinal menigitis; HAD 16 OPERATIONS TOTAL"  . History of ventricular fibrillation 03/23/2013  . Hyperlipemia   . Hypertension   . Ileus ([place]) 05/2015  . Myocardial infarction ([place]) 03/2013  . Pain in the chest   . Pneumonia 2016  . Rash and nonspecific skin eruption 06/29/2014  . Scabies  infestation 06/29/2014  . Shortness of breath dyspnea   . Type II diabetes mellitus ([place])    type 2  . Ventricular tachycardia [facility])    Past Surgical History:  Procedure Laterality Date  . AV FISTULA PLACEMENT Right 03/18/2015   Procedure: BRACHIOCEPHALIC ARTERIOVENOUS (AV) FISTULA CREATION;  Surgeon: Angelia Mould, MD;  Location: [place];  Service: Vascular;  Laterality: Right;  . BACK SURGERY  1950's   "for spinal menigitis; HAD 16 OPERATIONS TOTAL"  . CARDIAC CATHETERIZATION    . CHOLECYSTECTOMY N/A 06/17/2015   Procedure: LAPAROSCOPIC CHOLECYSTECTOMY;  Surgeon: Coralie Keens, MD;  Location: [place];  Service: General;  Laterality: N/A;  . CORONARY ARTERY BYPASS GRAFT  1999   cabg x4  . 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 HEAD SURGERY  1950'S   "for spinal menigitis; HAD 16 OPERATIONS TOTAL"  . IMPLANTABLE CARDIOVERTER DEFIBRILLATOR IMPLANT  03/24/2013   STJ single chamber ICD implanted by Dr Caryl Comes for secondary prevention  . IMPLANTABLE CARDIOVERTER DEFIBRILLATOR IMPLANT N/A 03/24/2013   Procedure: IMPLANTABLE CARDIOVERTER DEFIBRILLATOR IMPLANT;  Surgeon: Deboraha Sprang, MD;  Location: [facility] CATH LAB;  Service: Cardiovascular;  Laterality: N/A;  . INSERTION OF DIALYSIS CATHETER N/A 03/13/2015   Procedure: INSERTION OF TUNNELED DIALYSIS CATHETER;  Surgeon: Conrad Orr, MD;  Location: [place];  Service: Vascular;  Laterality: N/A;  . IR PARACENTESIS  08/20/2018  .  IR PARACENTESIS  09/11/2018  . LEFT HEART CATHETERIZATION WITH CORONARY/GRAFT ANGIOGRAM  03/07/2013   Procedure: LEFT HEART CATHETERIZATION WITH [name];  Surgeon: Clent Demark, MD;  Location: [facility] CATH LAB;  Service: Cardiovascular;;  . REVISON OF ARTERIOVENOUS FISTULA Right 01/03/2018   Procedure: REVISION OF ARTERIOVENOUS FISTULA;  Surgeon: Waynetta Sandy, MD;  Location: [place];  Service: Vascular;  Laterality: Right;  . REVISON OF ARTERIOVENOUS FISTULA Right  09/16/2018   Procedure: REVISION PLICATION OF ARTERIOVENOUS FISTULA RIGHT ARM;  Surgeon: Angelia Mould, MD;  Location: [place];  Service: Vascular;  Laterality: Right;  . UMBILICAL HERNIA REPAIR N/A 06/24/2015   Procedure: HERNIA REPAIR UMBILICAL ADULT;  Surgeon: Georganna Skeans, MD;  Location: [place];  Service: General;  Laterality: N/A;     A IV Location/Drains/Wounds Patient Lines/Drains/Airways Status   Active Line/Drains/Airways    Name:   Placement date:   Placement time:   Site:   Days:   Peripheral IV 09/22/18 Left;Posterior Wrist   09/22/18    0530    Wrist   41   Peripheral IV 10/24/2018 Left Antecubital   11/10/2018    0711    Antecubital   less than 1   Peripheral IV 11/06/2018 Left Forearm   10/25/2018    0600    Forearm   less than 1   Peripheral IV 11/14/2018 Right External jugular   11/11/2018    0726    External jugular   less than 1   Fistula / Graft Right Upper arm Arteriovenous fistula   03/18/15    0900    Upper arm   1325   Fistula / Graft Right Upper arm Arteriovenous fistula   09/13/18    0630    Upper arm   50   NG/OG Tube Orogastric 16 Fr.   11/23/2018    0718    -   less than 1   Airway 7.5 mm   11/20/2018    0705     less than 1   Incision (Closed) 09/16/18 Arm Right   09/16/18    1019     47          Intake/Output Last 24 hours No intake or output data in the 24 hours ending 10/24/2018 1052  Labs/Imaging Results for orders placed or performed during the hospital encounter of 11/16/2018 (from the past 48 hour(s))  CBG monitoring, ED     Status: Abnormal   Collection Time: 10/28/2018  7:20 AM  Result Value Ref Range   Glucose-Capillary 65 (L) 70 - 99 mg/dL  I-stat chem 8, ed     Status: Abnormal   Collection Time: 11/03/2018  7:27 AM  Result Value Ref Range   Sodium 134 (L) 135 - 145 mmol/L   Potassium 3.5 3.5 - 5.1 mmol/L   Chloride 92 (L) 98 - 111 mmol/L   BUN 43 (H) 8 - 23 mg/dL   Creatinine, Ser 7.20 (H) 0.61 - 1.24 mg/dL   Glucose, Bld 89 70 - 99 mg/dL   Calcium,  Ion 1.10 (L) 1.15 - 1.40 mmol/L   TCO2 30 22 - 32 mmol/L   Hemoglobin 12.6 (L) 13.0 - 17.0 g/dL   HCT 37.0 (L) 39.0 - 52.0 %  SARS Coronavirus 2 by RT PCR (hospital order, performed in [place] hospital lab) Nasopharyngeal Nasopharyngeal Swab     Status: None   Collection Time: 11/11/2018  7:32 AM   Specimen: Nasopharyngeal Swab  Result Value Ref Range  SARS Coronavirus 2 NEGATIVE NEGATIVE    Comment: (NOTE) If result is NEGATIVE SARS-CoV-2 target nucleic acids are NOT DETECTED. The SARS-CoV-2 RNA is generally detectable in upper and lower  respiratory specimens during the acute phase of infection. The lowest  concentration of SARS-CoV-2 viral copies this assay can detect is 250  copies / mL. A negative result does not preclude SARS-CoV-2 infection  and should not be used as the sole basis for treatment or other  patient management decisions.  A negative result may occur with  improper specimen collection / handling, submission of specimen other  than nasopharyngeal swab, presence of viral mutation(s) within the  areas targeted by this assay, and inadequate number of viral copies  (<250 copies / mL). A negative result must be combined with clinical  observations, patient history, and epidemiological information. If result is POSITIVE SARS-CoV-2 target nucleic acids are DETECTED. The SARS-CoV-2 RNA is generally detectable in upper and lower  respiratory specimens dur ing the acute phase of infection.  Positive  results are indicative of active infection with SARS-CoV-2.  Clinical  correlation with patient history and other diagnostic information is  necessary to determine patient infection status.  Positive results do  not rule out bacterial infection or co-infection with other viruses. If result is PRESUMPTIVE POSTIVE SARS-CoV-2 nucleic acids MAY BE PRESENT.   A presumptive positive result was obtained on the submitted specimen  and confirmed on repeat testing.  While 2019 novel  coronavirus  (SARS-CoV-2) nucleic acids may be present in the submitted sample  additional confirmatory testing may be necessary for epidemiological  and / or clinical management purposes  to differentiate between  SARS-CoV-2 and other Sarbecovirus currently known to infect humans.  If clinically indicated additional testing with an alternate test  methodology [phone]) is advised. The SARS-CoV-2 RNA is generally  detectable in upper and lower respiratory sp ecimens during the acute  phase of infection. The expected result is Negative. Fact Sheet for Patients:  StrictlyIdeas.no Fact Sheet for Healthcare Providers: BankingDealers.co.za This test is not yet approved or cleared by the [place] FDA and has been authorized for detection and/or diagnosis of SARS-CoV-2 by FDA under an Emergency Use Authorization (EUA).  This EUA will remain in effect (meaning this test can be used) for the duration of the COVID-19 declaration under Section 564(b)(1) of the Act, 21 U.S.C. section 360bbb-3(b)(1), unless the authorization is terminated or revoked sooner. Performed at [facility], [place] [address] [place], [place] [address]   Troponin I (High Sensitivity)     Status: Abnormal   Collection Time: 11/17/2018  7:53 AM  Result Value Ref Range   Troponin I (High Sensitivity) 40 (H) <18 ng/L    Comment: (NOTE) Elevated high sensitivity troponin I (hsTnI) values and significant  changes across serial measurements may suggest ACS but many other  chronic and acute conditions are known to elevate hsTnI results.  Refer to the "Links" section for chest pain algorithms and additional  guidance. Performed at [facility], [place] [address], [place], [place] [address]   Comprehensive metabolic panel     Status: Abnormal   Collection Time: 10/27/2018  7:53 AM  Result Value Ref Range   Sodium 131 (L) 135 - 145 mmol/L   Potassium 4.1 3.5 - 5.1 mmol/L    Chloride 92 (L) 98 - 111 mmol/L   CO2 23 22 - 32 mmol/L   Glucose, Bld 128 (H) 70 - 99 mg/dL   BUN 37 (  H) 8 - 23 mg/dL   Creatinine, Ser 7.24 (H) 0.61 - 1.24 mg/dL   Calcium 8.7 (L) 8.9 - 10.3 mg/dL   Total Protein 7.4 6.5 - 8.1 g/dL   Albumin 2.8 (L) 3.5 - 5.0 g/dL   AST 31 15 - 41 U/L   ALT 15 0 - 44 U/L   Alkaline Phosphatase 52 38 - 126 U/L   Total Bilirubin 1.0 0.3 - 1.2 mg/dL   GFR calc non Af Amer 7 (L) >60 mL/min   GFR calc Af Amer 8 (L) >60 mL/min   Anion gap 16 (H) 5 - 15    Comment: Performed at [facility] [address], [place], [place] [address]  Magnesium     Status: None   Collection Time: 11/03/2018  7:53 AM  Result Value Ref Range   Magnesium 2.2 1.7 - 2.4 mg/dL    Comment: Performed at [facility] [address] [place], [place] [address]  Phosphorus     Status: Abnormal   Collection Time: 10/29/2018  7:53 AM  Result Value Ref Range   Phosphorus 5.0 (H) 2.5 - 4.6 mg/dL    Comment: Performed at [facility] [address] [place], [place] [address]  CK     Status: None   Collection Time: 11/15/2018  7:53 AM  Result Value Ref Range   Total CK 116 49 - 397 U/L    Comment: Performed at [facility], [place] [address] [place], [place] [address]  CBC with Differential/Platelet     Status: Abnormal   Collection Time: 11/21/2018  8:03 AM  Result Value Ref Range   WBC 6.8 4.0 - 10.5 K/uL   RBC 3.05 (L) 4.22 - 5.81 MIL/uL   Hemoglobin 9.6 (L) 13.0 - 17.0 g/dL   HCT 31.6 (L) 39.0 - 52.0 %   MCV 103.6 (H) 80.0 - 100.0 fL   MCH 31.5 26.0 - 34.0 pg   MCHC 30.4 30.0 - 36.0 g/dL   RDW 16.6 (H) 11.5 - 15.5 %   Platelets 58 (L) 150 - 400 K/uL    Comment: REPEATED TO VERIFY PLATELET COUNT CONFIRMED BY SMEAR SPECIMEN CHECKED FOR CLOTS Immature Platelet Fraction may be clinically indicated, consider ordering this additional test [id number]    nRBC 0.0 0.0 - 0.2 %   Neutrophils Relative % 50 %   Neutro Abs 3.4 1.7 - 7.7 K/uL   Lymphocytes  Relative 40 %   Lymphs Abs 2.7 0.7 - 4.0 K/uL   Monocytes Relative 10 %   Monocytes Absolute 0.6 0.1 - 1.0 K/uL   Eosinophils Relative 0 %   Eosinophils Absolute 0.0 0.0 - 0.5 K/uL   Basophils Relative 0 %   Basophils Absolute 0.0 0.0 - 0.1 K/uL   Immature Granulocytes 0 %   Abs Immature Granulocytes 0.03 0.00 - 0.07 K/uL    Comment: Performed at [facility] [address], [place], [place] [address]  Lactic acid, plasma     Status: Abnormal   Collection Time: 11/14/2018  8:13 AM  Result Value Ref Range   Lactic Acid, Venous 4.4 (HH) 0.5 - 1.9 mmol/L    Comment: CRITICAL RESULT CALLED TO, READ BACK BY AND VERIFIED WITH: K.RAND RN @ [phone] 11/08/2018 BY C.EDENS Performed at [facility], [place] [address], [place], [place] [address]   Ethanol     Status: None   Collection Time: 11/12/2018  8:16 AM  Result Value  Ref Range   Alcohol, Ethyl (B) <10 <10 mg/dL    Comment: (NOTE) Lowest detectable limit for serum alcohol is 10 mg/dL. For medical purposes only. Performed at [facility], [place] [address], [place], [place] [address]   Protime-INR     Status: Abnormal   Collection Time: 10/27/2018  8:17 AM  Result Value Ref Range   Prothrombin Time 20.8 (H) 11.4 - 15.2 seconds   INR 1.8 (H) 0.8 - 1.2    Comment: (NOTE) INR goal varies based on device and disease states. Performed at [facility], [place] [address] [place], [place] [address]   I-STAT 7, (LYTES, BLD GAS, ICA, H+H)     Status: Abnormal   Collection Time: 10/29/2018  9:12 AM  Result Value Ref Range   pH, Arterial 7.547 (H) 7.350 - 7.450   pCO2 arterial 28.8 (L) 32.0 - 48.0 mmHg   pO2, Arterial 173.0 (H) 83.0 - 108.0 mmHg   Bicarbonate 25.0 20.0 - 28.0 mmol/L   TCO2 26 22 - 32 mmol/L   O2 Saturation 100.0 %   Acid-Base Excess 3.0 (H) 0.0 - 2.0 mmol/L   Sodium 131 (L) 135 - 145 mmol/L   Potassium 3.1 (L) 3.5 - 5.1 mmol/L   Calcium, Ion 1.14 (L) 1.15 - 1.40 mmol/L   HCT 30.0 (L) 39.0 - 52.0 %   Hemoglobin  10.2 (L) 13.0 - 17.0 g/dL   Patient temperature 98.6 F    Collection site ARTERIAL LINE    Drawn by Operator    Sample type ARTERIAL   Lactic acid, plasma     Status: Abnormal   Collection Time: 11/06/2018  9:30 AM  Result Value Ref Range   Lactic Acid, Venous 2.3 (HH) 0.5 - 1.9 mmol/L    Comment: CRITICAL VALUE NOTED.  VALUE IS CONSISTENT WITH PREVIOUSLY REPORTED AND CALLED VALUE. Performed at [facility], [place] [address], [place], [place] [address]   Ammonia     Status: Abnormal   Collection Time: 11/06/2018  9:30 AM  Result Value Ref Range   Ammonia 72 (H) 9 - 35 umol/L    Comment: Performed at [facility], [place] [address] [place], [place] [address]  Troponin I (High Sensitivity)     Status: Abnormal   Collection Time: 11/13/2018  9:46 AM  Result Value Ref Range   Troponin I (High Sensitivity) 135 (HH) <18 ng/L    Comment: CRITICAL RESULT CALLED TO, READ BACK BY AND VERIFIED WITH: K REND,RN [phone] [id number] [place] (NOTE) Elevated high sensitivity troponin I (hsTnI) values and significant  changes across serial measurements may suggest ACS but many other  chronic and acute conditions are known to elevate hsTnI results.  Refer to the Links section for chest pain algorithms and additional  guidance. Performed at [facility], [place] [address], [place], [place] [address]    Ct Head Wo Contrast  Result Date: 10/27/2018 CLINICAL DATA:  Altered level of consciousness, chest pain EXAM: CT HEAD WITHOUT CONTRAST TECHNIQUE: Contiguous axial images were obtained from the base of the skull through the vertex without intravenous contrast. COMPARISON:  03/18/2015 FINDINGS: Brain: Chronic ventriculomegaly, largely stable. No new midline shift. No evidence of acute infarction, hemorrhage, extra-axial collection, or mass. Vascular: Atherosclerotic and physiologic intracranial calcifications. Skull: Normal. Negative for fracture or focal lesion. Sinuses/Orbits: Mucoperiosteal thickening  in the right maxillary sinus. Fluid level in the sphenoid sinus. Other: None IMPRESSION: 1. Negative for bleed or other acute intracranial  process. 2. Stable chronic ventriculomegaly. 3. Right maxillary and sphenoid sinus disease. Electronically Signed   By: Lucrezia Europe M.D.   On: 11/06/2018 09:02   Dg Chest Portable 1 View  Result Date: 11/13/2018 CLINICAL DATA:  63 year old male status post CPR EXAM: PORTABLE CHEST 1 VIEW COMPARISON:  Prior chest x-ray 10/15/2018 FINDINGS: Patient is intubated. The tip of the endotracheal tube is 4.1 cm above the carina. A left subclavian approach cardiac rhythm maintenance device remains in unchanged position. The lead terminates overlying the right ventricle. Inspiratory volumes are low. Stable cardiomegaly. Atherosclerotic calcifications again noted in the transverse aorta. The low inspiratory volumes results in crowding of pulmonary vasculature. Stable bronchitic changes. No large effusion, pulmonary edema or pneumothorax. No focal airspace consolidation. No acute osseous abnormality. IMPRESSION: 1. Low inspiratory volumes without evidence of acute cardiopulmonary process. 2. The tip of the endotracheal tube is 4 cm above the carina. Electronically Signed   By: Jacqulynn Cadet M.D.   On: 11/08/2018 08:04   Ct Angio Chest/abd/pel For Dissection W And/or W/wo  Result Date: 11/14/2018 CLINICAL DATA:  63 year old male with chest pain, cardiopulmonary arrest. Additional clinical history includes HCV cirrhosis and end-stage renal disease on hemodialysis. EXAM: CT ANGIOGRAPHY CHEST, ABDOMEN AND PELVIS TECHNIQUE: Multidetector CT imaging through the chest, abdomen and pelvis was performed using the standard protocol during bolus administration of intravenous contrast. Multiplanar reconstructed images and MIPs were obtained and reviewed to evaluate the vascular anatomy. CONTRAST:  181mL OMNIPAQUE IOHEXOL 350 MG/ML SOLN COMPARISON:  Prior CT abdomen and pelvis 08/20/2018  FINDINGS: CTA CHEST FINDINGS Cardiovascular: Adequate opacification of the thoracic aorta and pulmonary arteries. 2 vessel aortic arch. The right brachiocephalic and left common carotid artery share a common origin. Scattered heterogeneous atherosclerotic plaque. No evidence of aneurysm. The aortic root is unremarkable. Patient is status post multivessel CABG. The visualized grafts appear patent. Calcifications present within the native coronary arteries. The main pulmonary artery is normal in caliber. Evaluation of the segmental and subsegmental arteries limited by respiratory motion artifact. No evidence of filling defect to suggest acute pulmonary embolus. Cardiomegaly with right heart enlargement. Cardiac rhythm maintenance device. The lead terminates in the right ventricle. No significant pericardial effusion. Mediastinum/Nodes: No mediastinal mass or suspicious adenopathy. Unremarkable thyroid gland. The esophagus conveys a gastric tube in the stomach. Lungs/Pleura: Small layering right pleural effusion. Trace layering left pleural effusion. Associated atelectasis. Greater than expected volume of atelectasis in the posterior right upper lobe. There is some associated bronchial wall thickening. This may represent small volume mucous plugging versus an early infectious/inflammatory process such as bronchopneumonia. 6 mm subpleural pulmonary nodule in the periphery of the right middle lobe remain unchanged and is likely benign. No evidence of pneumothorax. Musculoskeletal: No acute fracture or aggressive appearing lytic or blastic osseous lesion. No rib fractures visualized. Healed median sternotomy. Review of the MIP images confirms the above findings. CTA ABDOMEN AND PELVIS FINDINGS VASCULAR Aorta: Normal in caliber. Mild atherosclerotic plaque. No evidence of dissection or penetrating ulcer. Celiac: Mild median arcuate ligament compression. No evidence of aneurysm or dissection. SMA: Patent without evidence of  aneurysm, dissection, vasculitis or significant stenosis. Renals: Solitary right kidney. The renal artery is patent and unremarkable. No evidence of aneurysm or fibromuscular dysplasia. IMA: Patent without evidence of aneurysm, dissection, vasculitis or significant stenosis. Inflow: Scattered atherosclerotic plaque without significant stenosis or occlusion. No evidence of aneurysm or dissection. Veins: No obvious venous abnormality within the limitations of this arterial phase study. Review of the MIP  images confirms the above findings. NON-VASCULAR Hepatobiliary: Hepatic cirrhosis. Mildly dilated intrahepatic IVC and hepatic veins suggests elevated right heart pressures. The gallbladder is surgically absent. No arterially enhancing liver lesion. Pancreas: Unremarkable. No pancreatic ductal dilatation or surrounding inflammatory changes. Spleen: Normal in size without focal abnormality. Adrenals/Urinary Tract: Mild adreniform thickening bilaterally. No discrete nodules. Solitary right kidney. No evidence of hydronephrosis, nephrolithiasis or enhancing renal mass. The left kidney is visualized but is severely atretic. Unremarkable ureters and bladder. Stomach/Bowel: Colonic diverticular disease without CT evidence of active inflammation. No evidence of obstruction or focal bowel wall thickening. Normal appendix in the right lower quadrant. The terminal ileum is unremarkable. Lymphatic: No suspicious lymphadenopathy. Reproductive: Prostate gland is unremarkable. Other: Moderate to large volume peritoneal ascites. Additionally, the greater omentum is diffusely edematous. With internal fluid densities. Musculoskeletal: No acute fracture or aggressive appearing lytic or blastic osseous lesion. Review of the MIP images confirms the above findings. IMPRESSION: CTA CHEST 1. No evidence of aortic dissection, pulmonary embolism or other acute vascular abnormality. 2. Cardiomegaly with evidence of elevated right heart  pressures. 3. Advanced native coronary artery disease with surgical changes of prior multivessel CABG. The visualized portions of the bypass grafts appear patent. 4. Small right and trace left pleural effusions. 5. Greater than expected atelectasis in the dependent aspect of the right upper lobe. This may be secondary to recent cardiac arrest, or, in the appropriate clinical setting, reflect early bronchopneumonia. CTA ABD/PELVIS 1. No evidence of abdominal aortic aneurysm, dissection or other acute vascular abnormality. 2. Incidental note is made of mild median arcuate ligament compression the origin of the celiac artery. 3.  Aortic Atherosclerosis (ICD10-170.0). 4. Hepatic cirrhosis with moderate to large volume ascites. 5. Colonic diverticular disease without CT evidence of active inflammation. Signed, Criselda Peaches, MD, [facility] Electronically Signed   By: Jacqulynn Cadet M.D.   On: 10/30/2018 09:13    Pending Labs Unresulted Labs (From admission, onward)    Start     Ordered   11/03/18 0500  CBC  Tomorrow morning,   R     11/14/2018 1037   11/03/18 7322  Basic metabolic panel  Tomorrow morning,   R     10/29/2018 1037   11/03/18 0500  Blood gas, arterial  Tomorrow morning,   R     11/04/2018 1037   11/03/18 0500  Magnesium  Tomorrow morning,   R     11/21/2018 1037   11/22/2018 1032  Amylase  Once,   STAT     11/05/2018 1037   11/17/2018 1032  Lipase, blood  Once,   STAT     11/14/2018 1037   10/28/2018 1032  Lactic acid, plasma  STAT Now then every 3 hours,   R (with STAT occurrences)     11/03/2018 1037   11/01/2018 1027  Comprehensive metabolic panel  Once,   STAT     10/25/2018 1037   10/27/2018 1027  Magnesium  Once,   STAT     11/12/2018 1037   10/26/2018 1027  Phosphorus  Once,   STAT     11/13/2018 1037   11/06/2018 1027  Procalcitonin  Once,   STAT     11/09/2018 1037   10/31/2018 1027  Cortisol  Once,   STAT     11/06/2018 1037    10/29/2018 1027  CBC  Once,   STAT     11/23/2018 1037   11/13/2018 1027  Protime-INR  Once,   STAT     11/09/2018 1037   11/14/2018 1027  APTT  Once,   STAT     11/17/2018 1037   11/01/2018 1027  Culture, blood (routine x 2)  BLOOD CULTURE X 2,   R (with STAT occurrences)     11/11/2018 1037   11/01/2018 1027  Urine culture  Once,   STAT     11/20/2018 1037   11/10/2018 1027  Culture, respiratory (tracheal aspirate)  Once,   STAT     11/05/2018 1037   11/19/2018 1027  Strep pneumoniae urinary antigen  Once,   STAT     11/11/2018 1037   10/29/2018 1027  Blood gas, arterial  Once,   R     11/18/2018 1037   10/30/2018 0731  Culture, blood (routine x 2)  BLOOD CULTURE X 2,   STAT     11/20/2018 0732   11/01/2018 0730  CBC with Differential  Once,   STAT     11/15/2018 0732          Vitals/Pain Today's Vitals   11/14/2018 0930 11/20/2018 0933 11/11/2018 0936 11/19/2018 0939  BP: 140/86 139/86 (!) 141/89 (!) 143/83  Pulse: 70 70 70 70  Resp: (!) 23 (!) 23 (!) 21 19  Temp:      TempSrc:      SpO2: 97% 97% 99% 99%  Weight:      Height:        Isolation Precautions No active isolations  Medications Medications  naloxone (NARCAN) 2 MG/2ML injection (has no administration in time range)  0.9 %  sodium chloride infusion (has no administration in time range)  amiodarone (NEXTERONE PREMIX) 360-4.14 MG/200ML-% (1.8 mg/mL) IV infusion (60 mg/hr Intravenous New Bag/Given 11/13/2018 0941)  amiodarone (NEXTERONE PREMIX) 360-4.14 MG/200ML-% (1.8 mg/mL) IV infusion (has no administration in time range)  pantoprazole (PROTONIX) injection 40 mg (has no administration in time range)  0.9 %  sodium chloride infusion (has no administration in time range)  ipratropium-albuterol (DUONEB) 0.5-2.5 (3) MG/3ML nebulizer solution 3 mL (has no administration in time range)  aspirin chewable tablet 324 mg (has no administration in time range)    Or  aspirin suppository 300 mg (has no administration in time range)  EPINEPHrine (ADRENALIN) 1  MG/10ML injection (1 mg Intravenous Given 11/17/2018 0702)  iohexol (OMNIPAQUE) 350 MG/ML injection 100 mL (100 mLs Intravenous Contrast Given 11/01/2018 0813)    Mobility non-ambulatory High fall risk   Focused Assessments Renal Assessment Handoff:  Hemodialysis Schedule: Hemodialysis Schedule: Tuesday/Thursday/Saturday Last Hemodialysis date and time:    Restricted appendage: right arm     R Recommendations: See Admitting Provider Note  Report given to:   Additional Notes:

## 2018-11-02 NOTE — ED Notes (Signed)
Sod bicard bristojet iv kare newnam rn iv

## 2018-11-02 NOTE — Procedures (Signed)
Patient Name: Eric [name]  MRN: [id number]  Epilepsy Attending: Lora Havens  Referring Physician/Provider: Dr Noemi Chapel Date: 10/27/2018 Duration: 24.42 mins  Patient history: 63yo M with cardiac arrest. EEG to evaluate for seizures  Level of alertness: awake, sedated  AEDs during EEG study: None  Technical aspects: This EEG study was done with scalp electrodes positioned according to the 10-20 International system of electrode placement. Electrical activity was acquired at a sampling rate of 500Hz  and reviewed with a high frequency filter of 70Hz  and a low frequency filter of 1Hz . EEG data were recorded continuously and digitally stored.   DESCRIPTION: EEG showed continuous generalized polymorphic 2-5hz  theta-delta slowing. No clear posterior dominant rhythm was seen. EEG was reactive to verbal stimuli. Hyperventilation and photic stimulation were not performed.  ABNORMALITY - Continuous slow, generalized  IMPRESSION: This study is suggestive of moderate to severe diffuse encephalopathy No seizures or epileptiform discharges were seen throughout the recording.  Ruthie Berch Barbra Sarks

## 2018-11-02 NOTE — Progress Notes (Addendum)
Initial Nutrition Assessment  DOCUMENTATION CODES:   Not applicable  INTERVENTION:   If unable to extubate within 24-48 hrs recommend:    Trickle Vital High Protein @ 20 ml/hr via OGT  Increase per toleration to goal rate of 60 ml/hr (1440 ml)  B complex with Vitamin C- transition to rena-vit once able to take PO  At goal rate TF provides: 1440 kcals, 126 grams protein, 1204 ml free water. Meets 100% of needs.   NUTRITION DIAGNOSIS:   Increased nutrient needs related to chronic illness(ESRD/HD) as evidenced by estimated needs.  GOAL:   Patient will meet greater than or equal to 90% of their needs  MONITOR:   Weight trends, Labs, I & O's, Diet advancement, TF tolerance, Skin, Vent status  REASON FOR ASSESSMENT:   Ventilator    ASSESSMENT:   Patient with PMH significant for hepatitis C, CHF, CAD s/p ICD, cardiac arrest, cirrhosis with recurrent ascites, ESRD on HD, HTN, and DM. Presents this admission with cardiac arrest.   RD working remotely.  Currently on TTM 36. Requiring pressors. Minimal sedation. May need paracentesis. Per RN, pt with coffee ground OGT output. Will reach out to CCM regarding feeding.   Addendum: Per CCM, okay to start trickles only today. Plan to increase to goal rate per toleration.   Last outpatient reported on 10/8. Pt ran for entire treatment. Plan for HD once pt is hemodynamically stable.   EDW per nephrology: 79.5 kg  Current weight: 85.7 kg- trending up over the last 7 months  Patient is currently intubated on ventilator support MV: 7.5 L/min Temp (24hrs), Avg:96 F (35.6 C), Min:96 F (35.6 C), Max:96 F (35.6 C)   I/O: +111 ml since admit  Drips: amiodarone, fentanyl Labs: Na 131 (L) Phosphorus 5.0 (wdl for HD) corrected calcium 9.7 (wdl) last PTH 277  Diet Order:   Diet Order            Diet NPO time specified  Diet effective now              EDUCATION NEEDS:   Not appropriate for education at this  time  Skin:  Skin Assessment: Skin Integrity Issues: Skin Integrity Issues:: Diabetic Ulcer Diabetic Ulcer: L leg  Last BM:  PTA  Height:   Ht Readings from Last 1 Encounters:  11/04/2018 5\' 6"  (1.676 m)    Weight:   Wt Readings from Last 1 Encounters:  11/06/2018 85.7 kg    Ideal Body Weight:  64.5 kg  BMI:  Body mass index is 30.51 kg/m.  Estimated Nutritional Needs:   Kcal:  1432 kcal  Protein:  120-140 grams  Fluid:  1000 ml + UOP   Mariana Single RD, LDN Clinical Nutrition Pager # [phone]

## 2018-11-02 NOTE — Procedures (Signed)
Arterial Catheter Insertion Procedure Note Eric [name] [id number] 02-05-1955  Procedure: Insertion of Arterial Catheter  Indications: Blood pressure monitoring  Procedure Details Consent: Unable to obtain consent because of emergent medical necessity. Time Out: Verified patient identification, verified procedure, site/side was marked, verified correct patient position, special equipment/implants available, medications/allergies/relevent history reviewed, required imaging and test results available.  Performed  Maximum sterile technique was used including antiseptics, cap, gloves, gown, hand hygiene, mask and sheet. Skin prep: Chlorhexidine; local anesthetic administered 20 gauge catheter was inserted into left radial artery using the Seldinger technique. ULTRASOUND GUIDANCE USED: NO Evaluation Blood flow good; BP tracing good. Complications: No apparent complications.   Dimple Nanas 10/29/2018

## 2018-11-02 NOTE — ED Notes (Signed)
Dextrose 50 per iv tyler

## 2018-11-03 DIAGNOSIS — R579 Shock, unspecified: Secondary | ICD-10-CM

## 2018-11-03 DIAGNOSIS — K7469 Other cirrhosis of liver: Secondary | ICD-10-CM

## 2018-11-03 LAB — PROTIME-INR
INR: 1.9 — ABNORMAL HIGH (ref 0.8–1.2)
Prothrombin Time: 21.2 seconds — ABNORMAL HIGH (ref 11.4–15.2)

## 2018-11-03 LAB — COMPREHENSIVE METABOLIC PANEL
ALT: 13 U/L (ref 0–44)
AST: 31 U/L (ref 15–41)
Albumin: 2.4 g/dL — ABNORMAL LOW (ref 3.5–5.0)
Alkaline Phosphatase: 47 U/L (ref 38–126)
Anion gap: 18 — ABNORMAL HIGH (ref 5–15)
BUN: 45 mg/dL — ABNORMAL HIGH (ref 8–23)
CO2: 21 mmol/L — ABNORMAL LOW (ref 22–32)
Calcium: 8.7 mg/dL — ABNORMAL LOW (ref 8.9–10.3)
Chloride: 92 mmol/L — ABNORMAL LOW (ref 98–111)
Creatinine, Ser: 8.23 mg/dL — ABNORMAL HIGH (ref 0.61–1.24)
GFR calc Af Amer: 7 mL/min — ABNORMAL LOW (ref >60)
GFR calc non Af Amer: 6 mL/min — ABNORMAL LOW (ref >60)
Glucose, Bld: 152 mg/dL — ABNORMAL HIGH (ref 70–99)
Potassium: 3.8 mmol/L (ref 3.5–5.1)
Sodium: 131 mmol/L — ABNORMAL LOW (ref 135–145)
Total Bilirubin: 0.7 mg/dL (ref 0.3–1.2)
Total Protein: 6.7 g/dL (ref 6.5–8.1)

## 2018-11-03 LAB — APTT: aPTT: 33 seconds (ref 24–36)

## 2018-11-03 LAB — POCT I-STAT 7, (LYTES, BLD GAS, ICA,H+H)
Acid-Base Excess: 3 mmol/L — ABNORMAL HIGH (ref 0.0–2.0)
Bicarbonate: 25.2 mmol/L (ref 20.0–28.0)
Calcium, Ion: 1.03 mmol/L — ABNORMAL LOW (ref 1.15–1.40)
HCT: 33 % — ABNORMAL LOW (ref 39.0–52.0)
Hemoglobin: 11.2 g/dL — ABNORMAL LOW (ref 13.0–17.0)
O2 Saturation: 99 %
Patient temperature: 36.2
Potassium: 3.8 mmol/L (ref 3.5–5.1)
Sodium: 131 mmol/L — ABNORMAL LOW (ref 135–145)
TCO2: 26 mmol/L (ref 22–32)
pCO2 arterial: 28.6 mmHg — ABNORMAL LOW (ref 32.0–48.0)
pH, Arterial: 7.55 — ABNORMAL HIGH (ref 7.350–7.450)
pO2, Arterial: 124 mmHg — ABNORMAL HIGH (ref 83.0–108.0)

## 2018-11-03 LAB — GLUCOSE, CAPILLARY
Glucose-Capillary: 104 mg/dL — ABNORMAL HIGH (ref 70–99)
Glucose-Capillary: 109 mg/dL — ABNORMAL HIGH (ref 70–99)
Glucose-Capillary: 113 mg/dL — ABNORMAL HIGH (ref 70–99)
Glucose-Capillary: 121 mg/dL — ABNORMAL HIGH (ref 70–99)
Glucose-Capillary: 123 mg/dL — ABNORMAL HIGH (ref 70–99)
Glucose-Capillary: 95 mg/dL (ref 70–99)

## 2018-11-03 LAB — CBC
HCT: 31.1 % — ABNORMAL LOW (ref 39.0–52.0)
Hemoglobin: 9.9 g/dL — ABNORMAL LOW (ref 13.0–17.0)
MCH: 30.7 pg (ref 26.0–34.0)
MCHC: 31.8 g/dL (ref 30.0–36.0)
MCV: 96.6 fL (ref 80.0–100.0)
Platelets: 89 10*3/uL — ABNORMAL LOW (ref 150–400)
RBC: 3.22 MIL/uL — ABNORMAL LOW (ref 4.22–5.81)
RDW: 16.4 % — ABNORMAL HIGH (ref 11.5–15.5)
WBC: 7.9 10*3/uL (ref 4.0–10.5)
nRBC: 0 % (ref 0.0–0.2)

## 2018-11-03 LAB — LACTIC ACID, PLASMA
Lactic Acid, Venous: 3.9 mmol/L (ref 0.5–1.9)
Lactic Acid, Venous: 2.1 mmol/L (ref 0.5–1.9)

## 2018-11-03 LAB — MRSA PCR SCREENING: MRSA by PCR: NEGATIVE

## 2018-11-03 LAB — FIBRINOGEN: Fibrinogen: 279 mg/dL (ref 210–475)

## 2018-11-03 LAB — MAGNESIUM: Magnesium: 2.3 mg/dL (ref 1.7–2.4)

## 2018-11-03 LAB — PHOSPHORUS: Phosphorus: 5.1 mg/dL — ABNORMAL HIGH (ref 2.5–4.6)

## 2018-11-03 MED ORDER — AMIODARONE HCL 200 MG PO TABS
400.0000 mg | ORAL_TABLET | Freq: Every day | ORAL | Status: DC
  Administered 2018-11-03 – 2018-11-07 (×4): 400 mg
  Filled 2018-11-03 (×3): qty 2

## 2018-11-03 MED ORDER — SODIUM CHLORIDE 0.9 % IV SOLN
1.5000 g | Freq: Two times a day (BID) | INTRAVENOUS | Status: DC
  Administered 2018-11-03: 1.5 g via INTRAVENOUS
  Filled 2018-11-03 (×3): qty 4

## 2018-11-03 MED ORDER — LACTULOSE 10 GM/15ML PO SOLN
10.0000 g | Freq: Three times a day (TID) | ORAL | Status: DC
  Administered 2018-11-03 – 2018-11-06 (×11): 10 g
  Filled 2018-11-03 (×12): qty 15

## 2018-11-03 MED ORDER — SODIUM CHLORIDE 0.9 % IV SOLN
1.5000 g | Freq: Two times a day (BID) | INTRAVENOUS | Status: DC
  Administered 2018-11-03 – 2018-11-04 (×2): 1.5 g via INTRAVENOUS
  Filled 2018-11-03 (×3): qty 4

## 2018-11-03 MED ORDER — PHYTONADIONE 5 MG PO TABS
10.0000 mg | ORAL_TABLET | Freq: Once | ORAL | Status: AC
  Administered 2018-11-03: 10 mg
  Filled 2018-11-03: qty 2

## 2018-11-03 MED ORDER — PANTOPRAZOLE SODIUM 40 MG PO PACK
40.0000 mg | PACK | Freq: Every day | ORAL | Status: DC
  Administered 2018-11-03: 40 mg
  Filled 2018-11-03: qty 20

## 2018-11-03 MED ORDER — AMIODARONE HCL 200 MG PO TABS
400.0000 mg | ORAL_TABLET | Freq: Every day | ORAL | Status: DC
  Filled 2018-11-03: qty 2

## 2018-11-03 NOTE — Progress Notes (Signed)
Patient's daughter called requesting a tele visit. Called elink to set up visit.

## 2018-11-03 NOTE — Progress Notes (Signed)
Spoke with Philis Kendall RN re PIV consult.   Pt receiving vasopressors via PIV and need more access.  Renal pt with graft fistula and limited access.  Recommended to this RN that a CVC is most appropriate given the meds infusing and renal hx.  RN to notify MD of recommendation for access.

## 2018-11-03 NOTE — Progress Notes (Signed)
Daughter had called, called her back left message on her cell phone. Sputum sent to lab

## 2018-11-03 NOTE — Progress Notes (Signed)
Assisted tele visit to patient with daughter.  Otillia Cordone Ann, RN  

## 2018-11-03 NOTE — Progress Notes (Signed)
Pharmacy Antibiotic Note  Eric [name] is a 63 y.o. male admitted on 11/09/2018 with cardiac arrest.  Pharmacy has been consulted for unasyn dosing for possible aspiration pneumonia.  Patient currently undergoing TTM protocol. WBC normal at 7.9. Patient is ESRD, renal holding off on CRRT/HD until patient more stable as able. Patient pan cultured yesterday.  Plan: Unasyn 1.5g IV q12 hours  Consider 3g q day once HD plan is better known  Height: 5\' 6"  (167.6 cm) Weight: 189 lb 9.5 oz (86 kg) IBW/kg (Calculated) : 63.8  Temp (24hrs), Avg:97 F (36.1 C), Min:97 F (36.1 C), Max:97 F (36.1 C)  Recent Labs  Lab 11/06/2018 0727 11/01/2018 0753 11/14/2018 0803 11/01/2018 0813 11/01/2018 0930 11/22/2018 1435 10/28/2018 1436 11/11/2018 2006 11/03/18 0324  WBC  --   --  6.8  --   --  5.1  --   --  7.9  CREATININE 7.20* 7.24*  --   --   --   --  7.63*  --  8.23*  LATICACIDVEN  --   --   --  4.4* 2.3*  --  1.9 2.6*  --     Estimated Creatinine Clearance: 9.3 mL/min (A) (by C-G formula based on SCr of 8.23 mg/dL (H)).    No Known Allergies Thank you for allowing pharmacy to be a part of this patient's care.  Erin Hearing PharmD., BCPS Clinical Pharmacist 11/03/2018 8:55 AM

## 2018-11-03 NOTE — Progress Notes (Signed)
Subjective:  Became more hemodynamically unstable overnight- now on pressors- reportedly following some commands but night shift nurse denies overnight.  EEG moderate to severe encephalopathy  Objective Vital signs in last 24 hours: Vitals:   11/03/18 0530 11/03/18 0545 11/03/18 0600 11/03/18 0615  BP:   95/63   Pulse: 70 70 70 70  Resp:      Temp:      TempSrc:      SpO2: 100% 100% 100% 100%  Weight:      Height:       Weight change:   Intake/Output Summary (Last 24 hours) at 11/03/2018 0631 Last data filed at 11/03/2018 0600 Gross per 24 hour  Intake 852.98 ml  Output -  Net 852.98 ml    Dialyzes at [facility]- TTS-3 hours and 45 minutes, profile 4 EDW 79.5. HD Bath 2/2, Dialyzer 180, Heparin no. Access -right upper arm AV fistula.  Gets Hectorol 1 mcg per treatment, currently on Mircera 225.  Last hemoglobin 10.0-last calcium 8.8, last phosphorus 4.8, last PTH 277   Assessment/Plan: 63 year old white male with many medical issues including ESRD.  He is status post an arrest requiring CPR-now intubated in the ICU on cooling protocol 1 status post cardiac arrest-requiring CPR but with ROSC.  In the ICU with decreased mental status on cooling protocol at present.  Mixed signals as far as neuro recovery so far.  EEG not revealing  2 ESRD: Normally TTS via AVF.  Has not had HD since Thursday.  There are no indications for dialysis at this time and I hesitate to perform it with hemodynamic instability and would really like to avoid CRRT.  I will continue to monitor the patient daily and we will perform hemodialysis once patient has stabilized more- maybe tomorrow ?  3 Hypertension: Now hypotensive, on pressors.   He is on hydralazine as an outpatient.   FiO2 40% on the vent and no significant peripheral edema noted but does have ascites  4. Anemia of ESRD: No significant derangement at this time.  Actually improved from last check at the kidney center which was 10.0 5. Metabolic Bone  Disease: Home meds include Hectorol 1 mcg per treatment Sensipar 30 mg daily and PhosLo 2 with meals.  These will be resumed when patient is taking p.o.'s    Eric [name]    Labs: Basic Metabolic Panel: Recent Labs  Lab 10/26/2018 0753  11/18/2018 1436 11/03/18 0324 11/03/18 0510  NA 131*   < > 132* 131* 131*  K 4.1   < > 3.8 3.8 3.8  CL 92*  --  92* 92*  --   CO2 23  --  21* 21*  --   GLUCOSE 128*  --  160* 152*  --   BUN 37*  --  42* 45*  --   CREATININE 7.24*  --  7.63* 8.23*  --   CALCIUM 8.7*  --  9.1 8.7*  --   PHOS 5.0*  --   --  5.1*  --    < > = values in this interval not displayed.   Liver Function Tests: Recent Labs  Lab 11/19/2018 0753 11/03/18 0324  AST 31 31  ALT 15 13  ALKPHOS 52 47  BILITOT 1.0 0.7  PROT 7.4 6.7  ALBUMIN 2.8* 2.4*   Recent Labs  Lab 11/21/2018 1436  LIPASE 25  AMYLASE 132*   Recent Labs  Lab 11/11/2018 0930  AMMONIA 72*   CBC: Recent Labs  Lab  10/29/2018 0803  11/20/2018 1435 11/03/18 0324 11/03/18 0510  WBC 6.8  --  5.1 7.9  --   NEUTROABS 3.4  --   --   --   --   HGB 9.6*   < > 10.1* 9.9* 11.2*  HCT 31.6*   < > 30.9* 31.1* 33.0*  MCV 103.6*  --  95.7 96.6  --   PLT 58*  --  63* 89*  --    < > = values in this interval not displayed.   Cardiac Enzymes: Recent Labs  Lab 10/25/2018 0753  CKTOTAL 116   CBG: Recent Labs  Lab 11/04/2018 0720 10/29/2018 1221 11/20/2018 1944 11/10/2018 2320 11/03/18 0321  GLUCAP 65* 167* 115* 123* 121*    Iron Studies: No results for input(s): IRON, TIBC, TRANSFERRIN, FERRITIN in the last 72 hours. Studies/Results: Dg Abd 1 View  Result Date: 10/27/2018 CLINICAL DATA:  Nasogastric tube placement EXAM: ABDOMEN - 1 VIEW COMPARISON:  June 29, 2015 FINDINGS: The enteric tube appears to terminate beyond the gastric body and may reside at the level of the gastric antrum or proximal duodenum. The tip is not clearly visualized on this exam. The visualized bowel gas pattern is nonobstructive and  nonspecific IMPRESSION: The enteric tube appears to terminate beyond the gastric body and may reside at the level of the gastric antrum or proximal duodenum. The tip is not clearly visualized on this exam. Electronically Signed   By: Constance Holster M.D.   On: 11/16/2018 18:27   Ct Head Wo Contrast  Result Date: 11/14/2018 CLINICAL DATA:  Altered level of consciousness, chest pain EXAM: CT HEAD WITHOUT CONTRAST TECHNIQUE: Contiguous axial images were obtained from the base of the skull through the vertex without intravenous contrast. COMPARISON:  03/18/2015 FINDINGS: Brain: Chronic ventriculomegaly, largely stable. No new midline shift. No evidence of acute infarction, hemorrhage, extra-axial collection, or mass. Vascular: Atherosclerotic and physiologic intracranial calcifications. Skull: Normal. Negative for fracture or focal lesion. Sinuses/Orbits: Mucoperiosteal thickening in the right maxillary sinus. Fluid level in the sphenoid sinus. Other: None IMPRESSION: 1. Negative for bleed or other acute intracranial process. 2. Stable chronic ventriculomegaly. 3. Right maxillary and sphenoid sinus disease. Electronically Signed   By: Lucrezia Europe M.D.   On: 11/17/2018 09:02   Dg Chest Portable 1 View  Result Date: 10/24/2018 CLINICAL DATA:  63 year old male status post CPR EXAM: PORTABLE CHEST 1 VIEW COMPARISON:  Prior chest x-ray 10/15/2018 FINDINGS: Patient is intubated. The tip of the endotracheal tube is 4.1 cm above the carina. A left subclavian approach cardiac rhythm maintenance device remains in unchanged position. The lead terminates overlying the right ventricle. Inspiratory volumes are low. Stable cardiomegaly. Atherosclerotic calcifications again noted in the transverse aorta. The low inspiratory volumes results in crowding of pulmonary vasculature. Stable bronchitic changes. No large effusion, pulmonary edema or pneumothorax. No focal airspace consolidation. No acute osseous abnormality.  IMPRESSION: 1. Low inspiratory volumes without evidence of acute cardiopulmonary process. 2. The tip of the endotracheal tube is 4 cm above the carina. Electronically Signed   By: Jacqulynn Cadet M.D.   On: 11/12/2018 08:04   Ct Angio Chest/abd/pel For Dissection W And/or W/wo  Result Date: 11/23/2018 CLINICAL DATA:  63 year old male with chest pain, cardiopulmonary arrest. Additional clinical history includes HCV cirrhosis and end-stage renal disease on hemodialysis. EXAM: CT ANGIOGRAPHY CHEST, ABDOMEN AND PELVIS TECHNIQUE: Multidetector CT imaging through the chest, abdomen and pelvis was performed using the standard protocol during bolus administration of intravenous contrast. Multiplanar  reconstructed images and MIPs were obtained and reviewed to evaluate the vascular anatomy. CONTRAST:  136m OMNIPAQUE IOHEXOL 350 MG/ML SOLN COMPARISON:  Prior CT abdomen and pelvis 08/20/2018 FINDINGS: CTA CHEST FINDINGS Cardiovascular: Adequate opacification of the thoracic aorta and pulmonary arteries. 2 vessel aortic arch. The right brachiocephalic and left common carotid artery share a common origin. Scattered heterogeneous atherosclerotic plaque. No evidence of aneurysm. The aortic root is unremarkable. Patient is status post multivessel CABG. The visualized grafts appear patent. Calcifications present within the native coronary arteries. The main pulmonary artery is normal in caliber. Evaluation of the segmental and subsegmental arteries limited by respiratory motion artifact. No evidence of filling defect to suggest acute pulmonary embolus. Cardiomegaly with right heart enlargement. Cardiac rhythm maintenance device. The lead terminates in the right ventricle. No significant pericardial effusion. Mediastinum/Nodes: No mediastinal mass or suspicious adenopathy. Unremarkable thyroid gland. The esophagus conveys a gastric tube in the stomach. Lungs/Pleura: Small layering right pleural effusion. Trace layering left  pleural effusion. Associated atelectasis. Greater than expected volume of atelectasis in the posterior right upper lobe. There is some associated bronchial wall thickening. This may represent small volume mucous plugging versus an early infectious/inflammatory process such as bronchopneumonia. 6 mm subpleural pulmonary nodule in the periphery of the right middle lobe remain unchanged and is likely benign. No evidence of pneumothorax. Musculoskeletal: No acute fracture or aggressive appearing lytic or blastic osseous lesion. No rib fractures visualized. Healed median sternotomy. Review of the MIP images confirms the above findings. CTA ABDOMEN AND PELVIS FINDINGS VASCULAR Aorta: Normal in caliber. Mild atherosclerotic plaque. No evidence of dissection or penetrating ulcer. Celiac: Mild median arcuate ligament compression. No evidence of aneurysm or dissection. SMA: Patent without evidence of aneurysm, dissection, vasculitis or significant stenosis. Renals: Solitary right kidney. The renal artery is patent and unremarkable. No evidence of aneurysm or fibromuscular dysplasia. IMA: Patent without evidence of aneurysm, dissection, vasculitis or significant stenosis. Inflow: Scattered atherosclerotic plaque without significant stenosis or occlusion. No evidence of aneurysm or dissection. Veins: No obvious venous abnormality within the limitations of this arterial phase study. Review of the MIP images confirms the above findings. NON-VASCULAR Hepatobiliary: Hepatic cirrhosis. Mildly dilated intrahepatic IVC and hepatic veins suggests elevated right heart pressures. The gallbladder is surgically absent. No arterially enhancing liver lesion. Pancreas: Unremarkable. No pancreatic ductal dilatation or surrounding inflammatory changes. Spleen: Normal in size without focal abnormality. Adrenals/Urinary Tract: Mild adreniform thickening bilaterally. No discrete nodules. Solitary right kidney. No evidence of hydronephrosis,  nephrolithiasis or enhancing renal mass. The left kidney is visualized but is severely atretic. Unremarkable ureters and bladder. Stomach/Bowel: Colonic diverticular disease without CT evidence of active inflammation. No evidence of obstruction or focal bowel wall thickening. Normal appendix in the right lower quadrant. The terminal ileum is unremarkable. Lymphatic: No suspicious lymphadenopathy. Reproductive: Prostate gland is unremarkable. Other: Moderate to large volume peritoneal ascites. Additionally, the greater omentum is diffusely edematous. With internal fluid densities. Musculoskeletal: No acute fracture or aggressive appearing lytic or blastic osseous lesion. Review of the MIP images confirms the above findings. IMPRESSION: CTA CHEST 1. No evidence of aortic dissection, pulmonary embolism or other acute vascular abnormality. 2. Cardiomegaly with evidence of elevated right heart pressures. 3. Advanced native coronary artery disease with surgical changes of prior multivessel CABG. The visualized portions of the bypass grafts appear patent. 4. Small right and trace left pleural effusions. 5. Greater than expected atelectasis in the dependent aspect of the right upper lobe. This may be secondary to recent cardiac  arrest, or, in the appropriate clinical setting, reflect early bronchopneumonia. CTA ABD/PELVIS 1. No evidence of abdominal aortic aneurysm, dissection or other acute vascular abnormality. 2. Incidental note is made of mild median arcuate ligament compression the origin of the celiac artery. 3.  Aortic Atherosclerosis (ICD10-170.0). 4. Hepatic cirrhosis with moderate to large volume ascites. 5. Colonic diverticular disease without CT evidence of active inflammation. Signed, Criselda Peaches, MD, [facility]iology Electronically Signed   By: Jacqulynn Cadet M.D.   On: 11/13/2018 09:13   Medications: Infusions: . sodium chloride    .  sodium chloride    . sodium chloride    . amiodarone 30 mg/hr (11/03/18 0600)  . fentaNYL infusion INTRAVENOUS 50 mcg/hr (11/03/18 0600)  . norepinephrine (LEVOPHED) Adult infusion 10 mcg/min (11/03/18 0600)  . propofol (DIPRIVAN) infusion 30 mcg/kg/min (11/03/18 0520)    Scheduled Medications: . aspirin  324 mg Oral NOW   Or  . aspirin  300 mg Rectal NOW  . chlorhexidine gluconate (MEDLINE KIT)  15 mL Mouth Rinse BID  . Chlorhexidine Gluconate Cloth  6 each Topical Daily  . feeding supplement (VITAL HIGH PROTEIN)  1,000 mL Per Tube Q24H  . fentaNYL (SUBLIMAZE) injection  50 mcg Intravenous Once  . heparin  5,000 Units Subcutaneous Q8H  . influenza vac split quadrivalent PF  0.5 mL Intramuscular Tomorrow-1000  . ipratropium-albuterol  3 mL Nebulization Q6H  . mouth rinse  15 mL Mouth Rinse 10 times per day  . pantoprazole (PROTONIX) IV  40 mg Intravenous QHS    have reviewed scheduled and prn medications.  Physical Exam: General: sedated, did not respond to me, on vent Heart: RRR Lungs: mostly clear Abdomen: distended- known history of cirrhosis with frequent paracenteses required  Extremities: minimal peripheral edema Dialysis Access:  Right upper arm AVF-  Patent     11/03/2018,6:31 AM  LOS: 1 day

## 2018-11-03 NOTE — Progress Notes (Signed)
Straight cath done to obtain urine sample for legionella and culture. Returned back 30 cc of amber urine

## 2018-11-03 NOTE — Progress Notes (Signed)
Started rewarming  Per protocol

## 2018-11-03 NOTE — Progress Notes (Signed)
PULMONARY / CRITICAL CARE MEDICINE   NAME:  Eric [name], MRN:  [id number], DOB:  02/22/1955, LOS: 1 ADMISSION DATE:  11/14/2018,  REFERRING MD: 37 department physician, CHIEF COMPLAINT: Status post arrest  BRIEF HISTORY:    63 year old male with multiple medical comorbidities of cardiac arrest most likely secondary to respiratory event narcotics intubated in the emergency department on 11/16/2018 HISTORY OF PRESENT ILLNESS   63 year old with a plethora of health issues with included although not exclusive of hepatitis C, coronary artery disease status post ICD with a history of cardiac arrest.  Cirrhosis with normal ascites requiring monthly paracentesis.  End-stage renal disease but the right forearm AV graft.  Hypertension.  Type 2 diabetes mellitus.  He was in his usual state of poor health until 10/28/202021 is having significant pain primarily chest EMS was activated he was placed in the unit fentanyl 50 mcg notable was given to titrate his pain away.  Unfortunately 200 mcg in his right emergency department related to what is presumed to cardiac arrest respiratory insufficiency.  He required a short period of chest compressions with return of spontaneous circulation. At the time of this dictation he is not requiring vasoactive pressors.  Still on oral mechanical monitory support with FiO2 down down to 40%.  He did have some bloody secretions overnight.  He does have cirrhosis and there is has a coagulopathic state. Pulmonary critical care asked to admit.  We will not pursue temperature guided hypothermia due to his multiple comorbidities.  Currently he is a full code with his multiple health issues consideration may be made to address goals of care in the near future. SIGNIFICANT PAST MEDICAL HISTORY   Hepatitis C positive Diabetes mellitus Coronary artery disease status post cardiac arrest x2 AICD in place Hypertension Cirrhosis Congestive heart failure End-stage renal  disease  SIGNIFICANT EVENTS:  11/20/2018 cardiac arrest STUDIES:   10/30/2018 echo- LVEF 40 to 45% with mildly reduced LV function.  Normal RV size and function, normal atria.  Moderate to severe TR, otherwise normal valves. (Comparison 09/13/2018 echo- LVEF 55 to 60%, severely reduced RV systolic function)  CTA chest-no PE or aortic dissection, right upper lobe infiltrate with air bronchograms.  CULTURES:  11/04/2018 blood cultures x2>> never drawn 10/25/2018 urine culture>> 11/03/2018 respiratory cultures    ANTIBIOTICS:  11/12/2018 Unasyn for at least 3 days secondary to questionable aspiration while chest compressions>>  LINES/TUBES:  11/04/2018 endotracheal.>> 10/31/2018 right EJ>> CONSULTANTS:  11/02/2018 cardiology SUBJECTIVE:  Eric [name] became more responsive, opening eyes spontaneously and able to slightly respond to verbal stimulation.  Overnight heavily sedated with resulting hypotension, requiring peripheral vasopressors to be started.  CONSTITUTIONAL: BP (!) 84/67 (BP Location: Left Leg)   Pulse 70   Temp (!) 97 F (36.1 C) (Esophageal)   Resp 20   Ht 5\' 6"  (1.676 m)   Wt 86 kg   SpO2 100%   BMI 30.60 kg/m   I/O last 3 completed shifts: In: 853 [I.V.:853] Out: -      Vent Mode: PRVC FiO2 (%):  [40 %] 40 % Set Rate:  [14 bmp] 14 bmp Vt Set:  [450 mL-510 mL] 450 mL PEEP:  [8 cmH20] 8 cmH20 Plateau Pressure:  [23 SEG31-51 cmH20] 24 cmH20  PHYSICAL EXAM: General: Chronically ill-appearing man laying in bed intubated, sedated.  Nasal packing in the right nare.  Ongoing bleeding from his mouth. neuro: After sedation was held, he intermittently arouses and spontaneously opens his eyes, but not regularly tracking or answering yes/no questions.  Moving all extremities spontaneously. HEENT: ET tube and OG tube in place, continued bleeding from right nare and his mouth. Cardiovascular: Regular rate and rhythm, pacing.  No murmurs.   Lungs: Improved rhonchi,  less secretions from ET tube. Abdomen: Distended, soft, nontender Musculoskeletal: Left lower extremity pitting edema, mildly improved since yesterday.  No clubbing.  Right upper arm fistula with thrill. Skin: Chronic discoloration bilateral lower extremities.  RESOLVED PROBLEM LIST   ASSESSMENT AND PLAN   Acute hypoxic respiratory failure following cardiac arrest.  Respiratory alkalosis due to overbreathing the ventilator despite reduction in tidal volume.  Likely this is due to liver disease. -continue low tidal volume ventilation-7 cc/kg ideal body weight with goal plateau less than 30 and driving pressure less than 15 -Daily SAT and SBT as appropriate.  I think his bloody secretions from his upper respiratory tract preclude extubation today given his persistent encephalopathy. -Titrate down FiO2 as able to maintain saturations > 94% -Protonix for GI prophylaxis  Post cardiac arrest- possibly hypoxic due to excessive sedation. ACS unlikely with echocardiogram with out wall motion abnormalities.  Reduced LV function and increased troponin are likely due to chest compressions and cardiac stunning from arrest. -We will hold on aspirin given bleeding issues -appreciate cardiology's assistance  Shock- likely distributive due to sedation.  Potentially cardiogenic related to reduced LV function. -Continue norepinephrine as required to maintain MAP >65 -Using A-line to titrate vasopressors as there is question regarding the accuracy of ankle blood pressures.  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 -TTM to 36 degrees, essentially avoiding pyrexia.  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 MELD-Na+ 29.  Elevated ammonia. -likely will require a paracentesis prior to extubation -Starting lactulose 3 times daily  ESRD- no urgent indications for dialysis. TThS outpatient  HD. -Appreciate nephrology's assistance -Renally dose meds -Maintain euvolemia  Hypertension; currently in shock -Holding PTA antihypertensives  Hyponatremia-chronic, likely due to chronic liver disease -continue to monitor - Poor prognosis and chronic liver disease  Hypokalemia-stable -continue to monitor  Persistent oral bleeding- suspect coagulopathy -Checking coags and fibrinogen -Holding medical DVT prophylaxis  Best Practice / Goals of Care / Disposition.   DVT PROPHYLAXIS: Sequential compression hose SUP PPI NUTRITION: Tube feeds MOBILITY: Bedrest GOALS OF CARE: full code FAMILY DISCUSSIONS: Family updated at bedside 10/10. Will updated today. DISPOSITION ICU  LABS  Glucose Recent Labs  Lab 10/27/2018 0720 11/17/2018 1221 11/03/2018 1944 10/26/2018 2320 11/03/18 0321  GLUCAP 65* 167* 115* 123* 121*    BMET Recent Labs  Lab 11/11/2018 0753  10/24/2018 1436 11/03/18 0324 11/03/18 0510  NA 131*   < > 132* 131* 131*  K 4.1   < > 3.8 3.8 3.8  CL 92*  --  92* 92*  --   CO2 23  --  21* 21*  --   BUN 37*  --  42* 45*  --   CREATININE 7.24*  --  7.63* 8.23*  --   GLUCOSE 128*  --  160* 152*  --    < > = values in this interval not displayed.    Liver Enzymes Recent Labs  Lab 11/23/2018 0753 11/03/18 0324  AST 31 31  ALT 15 13  ALKPHOS 52 47  BILITOT 1.0 0.7  ALBUMIN 2.8* 2.4*    Electrolytes Recent Labs  Lab 11/19/2018 0753 11/06/2018 1436 11/03/18 0324  CALCIUM 8.7* 9.1 8.7*  MG 2.2  --  2.3  PHOS 5.0*  --  5.1*    CBC Recent Labs  Lab 11/20/2018 0803  10/24/2018 1435 11/03/18 0324 11/03/18 0510  WBC 6.8  --  5.1 7.9  --   HGB 9.6*   < > 10.1* 9.9* 11.2*  HCT 31.6*   < > 30.9* 31.1* 33.0*  PLT 58*  --  63* 89*  --    < > = values in this interval not displayed.    ABG Recent Labs  Lab 10/28/2018 1220 11/18/2018 1253 11/03/18 0510  PHART 7.607* 7.610* 7.550*  PCO2ART 23.5* 23.8* 28.6*  PO2ART 73.0* 73.0* 124.0*    Coag's Recent Labs   Lab 11/17/2018 0817 11/09/2018 1435  APTT  --  33  INR 1.8* 1.9*    Sepsis Markers Recent Labs  Lab 11/22/2018 0930 11/08/2018 1436 11/06/2018 2006  LATICACIDVEN 2.3* 1.9 2.6*  PROCALCITON  --  0.64  --     Cardiac Enzymes No results for input(s): TROPONINI, PROBNP in the last 168 hours.    This patient is critically ill with multiple organ system failure which requires frequent high complexity decision making, assessment, support, evaluation, and titration of therapies. This was completed through the application of advanced monitoring technologies and extensive interpretation of multiple databases. During this encounter critical care time was devoted to patient care services described in this note for 45 minutes.  Julian Hy, DO 11/03/18 9:02 AM [facility]

## 2018-11-04 ENCOUNTER — Inpatient Hospital Stay (HOSPITAL_COMMUNITY): Payer: Medicare Other

## 2018-11-04 DIAGNOSIS — Z95810 Presence of automatic (implantable) cardiac defibrillator: Secondary | ICD-10-CM

## 2018-11-04 DIAGNOSIS — D689 Coagulation defect, unspecified: Secondary | ICD-10-CM

## 2018-11-04 DIAGNOSIS — Z978 Presence of other specified devices: Secondary | ICD-10-CM

## 2018-11-04 DIAGNOSIS — K922 Gastrointestinal hemorrhage, unspecified: Secondary | ICD-10-CM

## 2018-11-04 DIAGNOSIS — Z452 Encounter for adjustment and management of vascular access device: Secondary | ICD-10-CM

## 2018-11-04 DIAGNOSIS — Z4502 Encounter for adjustment and management of automatic implantable cardiac defibrillator: Secondary | ICD-10-CM

## 2018-11-04 LAB — CBC WITH DIFFERENTIAL/PLATELET
Abs Immature Granulocytes: 0.01 10*3/uL (ref 0.00–0.07)
Basophils Absolute: 0 10*3/uL (ref 0.0–0.1)
Basophils Relative: 0 %
Eosinophils Absolute: 0 10*3/uL (ref 0.0–0.5)
Eosinophils Relative: 0 %
HCT: 33.6 % — ABNORMAL LOW (ref 39.0–52.0)
Hemoglobin: 10.9 g/dL — ABNORMAL LOW (ref 13.0–17.0)
Immature Granulocytes: 0 %
Lymphocytes Relative: 3 %
Lymphs Abs: 0.1 10*3/uL — ABNORMAL LOW (ref 0.7–4.0)
MCH: 31.3 pg (ref 26.0–34.0)
MCHC: 32.4 g/dL (ref 30.0–36.0)
MCV: 96.6 fL (ref 80.0–100.0)
Monocytes Absolute: 0.3 10*3/uL (ref 0.1–1.0)
Monocytes Relative: 7 %
Neutro Abs: 3.4 10*3/uL (ref 1.7–7.7)
Neutrophils Relative %: 90 %
Platelets: 94 10*3/uL — ABNORMAL LOW (ref 150–400)
RBC: 3.48 MIL/uL — ABNORMAL LOW (ref 4.22–5.81)
RDW: 16.7 % — ABNORMAL HIGH (ref 11.5–15.5)
WBC: 3.8 10*3/uL — ABNORMAL LOW (ref 4.0–10.5)
nRBC: 0 % (ref 0.0–0.2)

## 2018-11-04 LAB — CBC
HCT: 30.5 % — ABNORMAL LOW (ref 39.0–52.0)
Hemoglobin: 9.6 g/dL — ABNORMAL LOW (ref 13.0–17.0)
MCH: 30.7 pg (ref 26.0–34.0)
MCHC: 31.5 g/dL (ref 30.0–36.0)
MCV: 97.4 fL (ref 80.0–100.0)
Platelets: 75 10*3/uL — ABNORMAL LOW (ref 150–400)
RBC: 3.13 MIL/uL — ABNORMAL LOW (ref 4.22–5.81)
RDW: 16.5 % — ABNORMAL HIGH (ref 11.5–15.5)
WBC: 6.4 10*3/uL (ref 4.0–10.5)
nRBC: 0 % (ref 0.0–0.2)

## 2018-11-04 LAB — TYPE AND SCREEN
ABO/RH(D): B POS
Antibody Screen: NEGATIVE

## 2018-11-04 LAB — RENAL FUNCTION PANEL
Albumin: 2.3 g/dL — ABNORMAL LOW (ref 3.5–5.0)
Albumin: 2.5 g/dL — ABNORMAL LOW (ref 3.5–5.0)
Anion gap: 18 — ABNORMAL HIGH (ref 5–15)
Anion gap: 17 — ABNORMAL HIGH (ref 5–15)
BUN: 54 mg/dL — ABNORMAL HIGH (ref 8–23)
BUN: 48 mg/dL — ABNORMAL HIGH (ref 8–23)
CO2: 21 mmol/L — ABNORMAL LOW (ref 22–32)
CO2: 21 mmol/L — ABNORMAL LOW (ref 22–32)
Calcium: 8.3 mg/dL — ABNORMAL LOW (ref 8.9–10.3)
Calcium: 8.2 mg/dL — ABNORMAL LOW (ref 8.9–10.3)
Chloride: 92 mmol/L — ABNORMAL LOW (ref 98–111)
Chloride: 93 mmol/L — ABNORMAL LOW (ref 98–111)
Creatinine, Ser: 9 mg/dL — ABNORMAL HIGH (ref 0.61–1.24)
Creatinine, Ser: 7.74 mg/dL — ABNORMAL HIGH (ref 0.61–1.24)
GFR calc Af Amer: 6 mL/min — ABNORMAL LOW (ref >60)
GFR calc Af Amer: 8 mL/min — ABNORMAL LOW (ref >60)
GFR calc non Af Amer: 6 mL/min — ABNORMAL LOW (ref >60)
GFR calc non Af Amer: 7 mL/min — ABNORMAL LOW (ref >60)
Glucose, Bld: 149 mg/dL — ABNORMAL HIGH (ref 70–99)
Glucose, Bld: 89 mg/dL (ref 70–99)
Phosphorus: 5.9 mg/dL — ABNORMAL HIGH (ref 2.5–4.6)
Phosphorus: 5.1 mg/dL — ABNORMAL HIGH (ref 2.5–4.6)
Potassium: 3.9 mmol/L (ref 3.5–5.1)
Potassium: 4.2 mmol/L (ref 3.5–5.1)
Sodium: 131 mmol/L — ABNORMAL LOW (ref 135–145)
Sodium: 131 mmol/L — ABNORMAL LOW (ref 135–145)

## 2018-11-04 LAB — GLUCOSE, CAPILLARY
Glucose-Capillary: 122 mg/dL — ABNORMAL HIGH (ref 70–99)
Glucose-Capillary: 111 mg/dL — ABNORMAL HIGH (ref 70–99)
Glucose-Capillary: 118 mg/dL — ABNORMAL HIGH (ref 70–99)
Glucose-Capillary: 72 mg/dL (ref 70–99)
Glucose-Capillary: 103 mg/dL — ABNORMAL HIGH (ref 70–99)
Glucose-Capillary: 49 mg/dL — ABNORMAL LOW (ref 70–99)
Glucose-Capillary: 62 mg/dL — ABNORMAL LOW (ref 70–99)

## 2018-11-04 LAB — URINE CULTURE: Culture: NO GROWTH

## 2018-11-04 LAB — PROTIME-INR
INR: 1.8 — ABNORMAL HIGH (ref 0.8–1.2)
INR: 1.9 — ABNORMAL HIGH (ref 0.8–1.2)
Prothrombin Time: 20.7 seconds — ABNORMAL HIGH (ref 11.4–15.2)
Prothrombin Time: 21.9 seconds — ABNORMAL HIGH (ref 11.4–15.2)

## 2018-11-04 LAB — LACTIC ACID, PLASMA: Lactic Acid, Venous: 2.6 mmol/L (ref 0.5–1.9)

## 2018-11-04 MED ORDER — NOREPINEPHRINE 16 MG/250ML-% IV SOLN: 0.0000 ug/min | INTRAVENOUS | Status: DC

## 2018-11-04 MED ORDER — SODIUM CHLORIDE 0.9 % IV SOLN
3.0000 g | Freq: Three times a day (TID) | INTRAVENOUS | Status: DC
  Administered 2018-11-04 – 2018-11-05 (×4): 3 g via INTRAVENOUS
  Filled 2018-11-04 (×2): qty 8
  Filled 2018-11-04 (×2): qty 3
  Filled 2018-11-04: qty 8
  Filled 2018-11-04: qty 3

## 2018-11-04 MED ORDER — PRISMASOL BGK 4/2.5 32-4-2.5 MEQ/L IV SOLN
INTRAVENOUS | Status: DC
  Administered 2018-11-04 – 2018-11-07 (×20): via INTRAVENOUS_CENTRAL

## 2018-11-04 MED ORDER — DEXTROSE 50 % IV SOLN
INTRAVENOUS | Status: AC
  Administered 2018-11-04: 25 mL
  Filled 2018-11-04: qty 50

## 2018-11-04 MED ORDER — SODIUM CHLORIDE 0.9% IV SOLUTION
Freq: Once | INTRAVENOUS | Status: AC
  Administered 2018-11-04: 11:00:00 via INTRAVENOUS

## 2018-11-04 MED ORDER — IPRATROPIUM-ALBUTEROL 0.5-2.5 (3) MG/3ML IN SOLN
3.0000 mL | Freq: Four times a day (QID) | RESPIRATORY_TRACT | Status: DC
  Administered 2018-11-04 – 2018-11-06 (×12): 3 mL via RESPIRATORY_TRACT
  Filled 2018-11-04 (×12): qty 3

## 2018-11-04 MED ORDER — SODIUM CHLORIDE 0.9 % FOR CRRT: INTRAVENOUS_CENTRAL | Status: DC | PRN

## 2018-11-04 MED ORDER — NOREPINEPHRINE 4 MG/250ML-% IV SOLN
0.0000 ug/min | INTRAVENOUS | Status: DC
  Administered 2018-11-04: 2 ug/min via INTRAVENOUS
  Administered 2018-11-05 (×2): 6 ug/min via INTRAVENOUS
  Administered 2018-11-05: 8 ug/min via INTRAVENOUS
  Administered 2018-11-05: 5 ug/min via INTRAVENOUS
  Administered 2018-11-06: 2 ug/min via INTRAVENOUS
  Filled 2018-11-04 (×3): qty 250

## 2018-11-04 MED ORDER — HEPARIN SODIUM (PORCINE) 1000 UNIT/ML DIALYSIS: 1000.0000 [IU] | INTRAMUSCULAR | Status: DC | PRN

## 2018-11-04 MED ORDER — PANTOPRAZOLE SODIUM 40 MG IV SOLR
40.0000 mg | Freq: Two times a day (BID) | INTRAVENOUS | Status: DC
  Administered 2018-11-04 – 2018-11-07 (×7): 40 mg via INTRAVENOUS
  Filled 2018-11-04 (×7): qty 40

## 2018-11-04 MED ORDER — PRISMASOL BGK 4/2.5 32-4-2.5 MEQ/L REPLACEMENT SOLN
Status: DC
  Administered 2018-11-04 – 2018-11-06 (×6): via INTRAVENOUS_CENTRAL

## 2018-11-04 MED ORDER — PRISMASOL BGK 4/2.5 32-4-2.5 MEQ/L REPLACEMENT SOLN
Status: DC
  Administered 2018-11-04 – 2018-11-06 (×5): via INTRAVENOUS_CENTRAL

## 2018-11-04 MED ORDER — FENTANYL CITRATE (PF) 100 MCG/2ML IJ SOLN
100.0000 ug | Freq: Once | INTRAMUSCULAR | Status: AC
  Administered 2018-11-04: 11:00:00 100 ug via INTRAVENOUS

## 2018-11-04 MED ORDER — FENTANYL CITRATE (PF) 100 MCG/2ML IJ SOLN
INTRAMUSCULAR | Status: AC
  Administered 2018-11-04: 100 ug via INTRAVENOUS
  Filled 2018-11-04: qty 2

## 2018-11-04 NOTE — Progress Notes (Signed)
Elink notified about pt's second bloody stool, blood pressure decreased per ALINE requiring levo gtt to be restarted. Awaiting orders. Will continue to monitor.

## 2018-11-04 NOTE — Progress Notes (Signed)
Subjective:  Remains intubated, was agitated last night and had to be sedated again.  Now off of all sedation this morning.  So far no purposeful movements.  Was on high dose of Levophed, he is now weaned off this morning.  Intake/Output from previous day:  I/O last 3 completed shifts: In: 1551.4 [I.V.:1094.8; NG/GT:220; IV Piggyback:236.5] Out: 30 [Urine:30] Total I/O In: 419 [I.V.:139; NG/GT:280] Out: -   Blood pressure 113/69, pulse 71, temperature 99 F (37.2 C), temperature source Core, resp. rate 17, height '5\' 6"'$  (1.676 m), weight 87.8 kg, SpO2 100 %. Body mass index is 31.24 kg/m.  Physical Exam  Constitutional:  Moderately built and mildly obese, intubated and sedated.  Cardiovascular: Normal rate, regular rhythm and normal heart sounds. Exam reveals no gallop and no friction rub.  Pulses:      Dorsalis pedis pulses are 0 on the right side and 0 on the left side.       Posterior tibial pulses are 0 on the right side and 0 on the left side.  Pulmonary/Chest: He has rales (Bilateral and diffuse).  Abdominal: Soft. Bowel sounds are normal. He exhibits distension.  Musculoskeletal:        General: No edema.   Lab Results: BMP BNP (last 3 results) Recent Labs    07/30/18 0850 09/13/18 0310  BNP 2,572.9* 2,881.3*    ProBNP (last 3 results) No results for input(s): PROBNP in the last 8760 hours. BMP Latest Ref Rng & Units 11/04/2018 11/03/2018 11/03/2018  Glucose 70 - 99 mg/dL 149(H) - 152(H)  BUN 8 - 23 mg/dL 54(H) - 45(H)  Creatinine 0.61 - 1.24 mg/dL 9.00(H) - 8.23(H)  Sodium 135 - 145 mmol/L 131(L) 131(L) 131(L)  Potassium 3.5 - 5.1 mmol/L 3.9 3.8 3.8  Chloride 98 - 111 mmol/L 92(L) - 92(L)  CO2 22 - 32 mmol/L 21(L) - 21(L)  Calcium 8.9 - 10.3 mg/dL 8.3(L) - 8.7(L)   Hepatic Function Latest Ref Rng & Units 11/04/2018 11/03/2018 10/25/2018  Total Protein 6.5 - 8.1 g/dL - 6.7 7.4  Albumin 3.5 - 5.0 g/dL 2.3(L) 2.4(L) 2.8(L)  AST 15 - 41 U/L - 31 31  ALT 0 - 44  U/L - 13 15  Alk Phosphatase 38 - 126 U/L - 47 52  Total Bilirubin 0.3 - 1.2 mg/dL - 0.7 1.0  Bilirubin, Direct 0.1 - 0.5 mg/dL - - -   CBC Latest Ref Rng & Units 11/03/2018 11/03/2018 11/18/2018  WBC 4.0 - 10.5 K/uL - 7.9 5.1  Hemoglobin 13.0 - 17.0 g/dL 11.2(L) 9.9(L) 10.1(L)  Hematocrit 39.0 - 52.0 % 33.0(L) 31.1(L) 30.9(L)  Platelets 150 - 400 K/uL - 89(L) 63(L)   Lipid Panel     Component Value Date/Time   CHOL 148 01/01/2018 1652   TRIG 113 01/01/2018 1652   HDL 32 (L) 01/01/2018 1652   CHOLHDL 4.6 01/01/2018 1652   VLDL 23 01/01/2018 1652   LDLCALC 93 01/01/2018 1652   Cardiac Panel (last 3 results) Recent Labs    11/21/2018 0753  CKTOTAL 116    HEMOGLOBIN A1C Lab Results  Component Value Date   HGBA1C 4.7 (L) 01/01/2018   MPG 88.19 01/01/2018   TSH No results for input(s): TSH in the last 8760 hours. Imaging: Dg Abd 1 View  Result Date: 11/03/2018 CLINICAL DATA:  Nasogastric tube placement EXAM: ABDOMEN - 1 VIEW COMPARISON:  June 29, 2015 FINDINGS: The enteric tube appears to terminate beyond the gastric body and may reside at the level of  the gastric antrum or proximal duodenum. The tip is not clearly visualized on this exam. The visualized bowel gas pattern is nonobstructive and nonspecific IMPRESSION: The enteric tube appears to terminate beyond the gastric body and may reside at the level of the gastric antrum or proximal duodenum. The tip is not clearly visualized on this exam. Electronically Signed   By: Constance Holster M.D.   On: 11/08/2018 18:27   Dg Chest Port 1 View  Result Date: 11/04/2018 CLINICAL DATA:  Endotracheal tube. EXAM: PORTABLE CHEST 1 VIEW COMPARISON:  November 02, 2018. FINDINGS: Stable cardiomegaly. Endotracheal tube is unchanged in position. Nasogastric tube is seen entering the stomach. Left-sided pacemaker is unchanged in position. No pneumothorax is noted. Mild bibasilar subsegmental atelectasis is noted. No pleural effusion is noted.  Bony thorax is unremarkable. IMPRESSION: Endotracheal and nasogastric tubes are in grossly good position. Mild bibasilar subsegmental atelectasis is noted. Electronically Signed   By: Marijo Conception M.D.   On: 11/04/2018 07:21    Cardiac Studies:  Coronary angio 02/24/2013:Diffuse distal LAD disease, RCA stenosis not felt to be amenable for PCI, patent LIMA to LAD and SVG to OM1.  Lower extremity arterial duplex 02/20/2018: No hemodynamically significant stenoses are identified in the right lower extremity arterial system. Moderate velocity increase at the left mid posterior tibial artery suggests >50% stenosis. Severely abnormal waveforms of the right ankle. Moderately abnormal waveforms of the left ankle. Non-compressible ABI bilateral due to medial calcinosis.  Scheduled Remote ICD check  08.31.20: VVI ICD ICM.  No VHR episodes. Normal function. CorVue impedance monitoring shows a recent dip below the baseline and now returned to baseline. Battery longevity is 3.9 years. RV pacing is 91 %.  Echocardiogram 11/18/2018:    1. Left ventricular ejection fraction, by visual estimation, is 40 to 45%. The left ventricle has mild to moderately decreased function. Normal left ventricular size. There is no left ventricular hypertrophy.  2. Left ventricular diastolic function could not be evaluated pattern of LV diastolic filling.  3. Global right ventricle has normal systolic function.The right ventricular size is normal.  4. Left atrial size was normal.  5. Right atrial size was normal.  6. The mitral valve is abnormal. Mild mitral valve regurgitation. No evidence of mitral stenosis.  7. The tricuspid valve is normal in structure. Tricuspid valve regurgitation moderate-severe.  Moderate pulmonary hypertension, PA pressure 41 mmHg.  Compared to 02/20/2018, EF was previously normal.  Tricuspid regurgitation new.  EKG 11/03/2018: Ventricularly paced rhythm.  No further analysis.  EKG during  resuscitation reveals frequent PVCs, ventricular couplets.  Telemetry 11/04/18 normal sinus rhythm.  Ventricularly paced rhythm.  No significant arrhythmias.  Scheduled Meds: . amiodarone  400 mg Per Tube Daily  . chlorhexidine gluconate (MEDLINE KIT)  15 mL Mouth Rinse BID  . Chlorhexidine Gluconate Cloth  6 each Topical Daily  . feeding supplement (VITAL HIGH PROTEIN)  1,000 mL Per Tube Q24H  . influenza vac split quadrivalent PF  0.5 mL Intramuscular Tomorrow-1000  . ipratropium-albuterol  3 mL Nebulization QID  . lactulose  10 g Per Tube TID  . mouth rinse  15 mL Mouth Rinse 10 times per day  . pantoprazole sodium  40 mg Per Tube QHS   Continuous Infusions: .  prismasol BGK 4/2.5    .  prismasol BGK 4/2.5    . sodium chloride    . sodium chloride    . sodium chloride    . ampicillin-sulbactam (UNASYN) IV    .  fentaNYL infusion INTRAVENOUS Stopped (11/03/18 0759)  . norepinephrine (LEVOPHED) Adult infusion 6 mcg/min (11/04/18 0800)  . prismasol BGK 4/2.5    . propofol (DIPRIVAN) infusion 25 mcg/kg/min (11/04/18 0800)   PRN Meds:.Place/Maintain arterial line **AND** sodium chloride, fentaNYL, heparin, labetalol, sodium chloride  Assessment/Plan:  1.  Respiratory failure, could be related to fentanyl but etiology questionable 2.  Generalized aches and pains, patient received 200 mcg of fentanyl from home to be transferred to the emergency room.  Had sudden cardiac arrest.  During resuscitation and CPR, defibrillator did go off.  Patient was in VF converted to paced rhythm. 3. CAD of the native vessel with chronic stable angina pectoris. 4.  Chronic diastolic heart failure 5.  End-stage renal disease on hemodialysis 6.  Cirrhosis of liver with ascites. 7. ICD "St. Jude" in situ. 8.  GI bleed, patient had a bowel movement this morning and was noted to be bloody.  CBC pending.  In person ICD check 11/04/18  Normal ICD function.  Patient had VF arrest at 7:05 AM and was  defibrillated with successful conversion to paced rhythm. CoreVue reveals fluid accumulation last 7 to 8 days.  Otherwise no significant arrhythmias.  Underlying normal sinus rhythm at 80 bpm. Programming change: Normal lead threshold and impedance.  V pacing to 60 bpm.  This should help with synchronization of the LV.  Patient has underlying native rhythm.  Recommendation: Patient has diffuse hypokinesis, probably related to hypoxemic respiratory failure leading to cardiac arrhythmias and successfully converted to V paced rhythm.  This morning he is in sinus rhythm.  Echocardiogram reveals global hypokinesis, serum troponins elevated, suspect probably related to demand ischemia in view of diffuse coronary artery disease.  There is no regional wall motion abnormality.  Patient also has hypoxemic anoxic brain injury presently, he is now off of sedation this morning.  We will reassess his mentation and then decide upon whether he needs cardiac catheterization at some point.  Has significant amount of fluid overload both by CT and also clinically, probably related to worsening ascites and diastolic dysfunction.  Patient is anuric and has end-stage renal disease and is on hemodialysis.  We will continue to follow sidelines.  Please do not hesitate to contact me if you have any questions.   Adrian Prows, M.D. 11/04/2018, 8:24 AM [facility] Pager: [phone] Office: [phone] If no answer: [phone]

## 2018-11-04 NOTE — Progress Notes (Signed)
Nutrition Follow-up  DOCUMENTATION CODES:   Not applicable  INTERVENTION:   - Tube feeds currently on hold per CCM due to possible GI bleed  Recommend tube feeds via OGT: - Vital 1.5 @ 35 ml/hr (840 ml/day) - Pro-stat 30 ml 5 X daily  Recommended tube feeding regimen provides 1760 kcal, 132 grams of protein, and 642 ml of H2O.   - Recommend B complex with vitamin C  NUTRITION DIAGNOSIS:   Increased nutrient needs related to chronic illness (ESRD/HD) as evidenced by estimated needs.  Ongoing  GOAL:   Patient will meet greater than or equal to 90% of their needs  Unmet at this time  MONITOR:   Vent status, Labs, Weight trends, Skin, I & O's  REASON FOR ASSESSMENT:   Ventilator    ASSESSMENT:   Patient with PMH significant for hepatitis C, CHF, CAD s/p ICD, cardiac arrest, cirrhosis with recurrent ascites, ESRD on HD, HTN, and DM. Presents this admission with cardiac arrest.  10/10 - trickle TF initiated  Noted plan to start CRRT today. Discussed pt with RN and during ICU rounds.  EDW: 79.5 kg  OG tube in place. TF off at this time per CCM due to possible GI bleed. Possible paracentesis planned.  Current TF orders: Vital High Protein @ 20 ml/hr  Pt with non-pitting edema to BUE and mild pitting edema to BLE.  Patient is currently intubated on ventilator support MV: 8.7 L/min Temp (24hrs), Avg:98.8 F (37.1 C), Min:98.4 F (36.9 C), Max:99 F (37.2 C) BP (a-line): 132/61 MAP (a-line): 76  Medications reviewed and include: lactulose, Protonix, IV abx  Labs reviewed: sodium 131, chloride 92, phosphorus 5.9 CBG's: 95-122 x 24 hours  UOP: 30 ml x 24 hours I/O's: +2.2 L since admit  NUTRITION - FOCUSED PHYSICAL EXAM:  Unable to complete at this time. Pt undergoing procedure at time of RD visit.  Diet Order:   Diet Order            Diet NPO time specified  Diet effective now              EDUCATION NEEDS:   Not appropriate for education at  this time  Skin:  Skin Assessment: Skin Integrity Issues: Skin Integrity Issues: Diabetic Ulcer: left leg  Last BM:  11/04/18 large type 7  Height:   Ht Readings from Last 1 Encounters:  11/23/2018 5\' 6"  (1.676 m)    Weight:   Wt Readings from Last 1 Encounters:  11/04/18 87.8 kg    Ideal Body Weight:  64.5 kg  BMI:  Body mass index is 31.24 kg/m.  Estimated Nutritional Needs:   Kcal:  1703  Protein:  125-145 grams  Fluid:  1000 ml + UOP    Gaynell Face, MS, RD, LDN Inpatient Clinical Dietitian Pager: [phone] Weekend/After Hours: [phone]

## 2018-11-04 NOTE — Progress Notes (Signed)
PHARMACY NOTE:  ANTIMICROBIAL RENAL DOSAGE ADJUSTMENT  Current antimicrobial regimen includes a mismatch between antimicrobial dosage and estimated renal function.  As per policy approved by the Pharmacy & Therapeutics and Medical Executive Committees, the antimicrobial dosage will be adjusted accordingly.  Current antimicrobial dosage:  Unasyn 1.5g IV q12h  Indication: aspiration PNA  Renal Function:  Estimated Creatinine Clearance: 8.5 mL/min (A) (by C-G formula based on SCr of 9 mg/dL (H)). []      On intermittent HD, scheduled: [x]      On CRRT    Antimicrobial dosage has been changed to:  Unasyn3g IV q8h   Thank you for allowing pharmacy to be a part of this patient's care.   Arrie Senate, PharmD, BCPS Clinical Pharmacist [phone] Please check AMION for all [facility] numbers 11/04/2018

## 2018-11-04 NOTE — TOC Initial Note (Signed)
Transition of Care [facility]) - Initial/Assessment Note    Patient Details  Name: Eric [name] MRN: [id number] Date of Birth: 10/04/1955  [facility]) CM/SW Contact:    Bethena Roys, RN Phone Number: 11/04/2018, 12:05 PM  Clinical Narrative: Pt presented for cardiac arrest-intubated. Hx ESRD TTS schedule. Plan for CRRT- CM will continue to follow for transition of care needs.                  Barriers to Discharge: Continued Medical Work up  Discharge Planning Services: CM Consult   Living arrangements for the past 2 months: Single Family Home  Prior Living Arrangements/Services Living arrangements for the past 2 months: Single Family Home Lives with:: Spouse   Activities of Daily Living   ADL Screening (condition at time of admission) Patient's cognitive ability adequate to safely complete daily activities?: Yes Is the patient deaf or have difficulty hearing?: No Does the patient have difficulty seeing, even when wearing glasses/contacts?: No Does the patient have difficulty concentrating, remembering, or making decisions?: No Patient able to express need for assistance with ADLs?: Yes Does the patient have difficulty dressing or bathing?: No Independently performs ADLs?: Yes (appropriate for developmental age) Does the patient have difficulty walking or climbing stairs?: No Weakness of Legs: Both Weakness of Arms/Hands: None  Alcohol / Substance Use: Not Applicable Psych Involvement: No (comment)  Admission diagnosis:  sick Patient Active Problem List   Diagnosis Date Noted  . Encounter for central line placement   . Cardiac arrest due to underlying cardiac condition ([place]) 11/17/2018  . Cardiac arrest ([place]) 11/03/2018  . On mechanically assisted ventilation ([place])   . Hyponatremia   . Hypokalemia   . Anoxic brain injury ([place])   . Atypical chest pain 07/30/2018  . S/P CABG (coronary artery bypass graft) 04/01/2018  . Claudication ([place])  04/01/2018  . Chronic diastolic (congestive) heart failure ([place]) 04/01/2018  . Paresthesia 12/07/2015  . BPH (benign prostatic hyperplasia)   . Atherosclerosis of native coronary artery of native heart without angina pectoris 10/01/2015  . Chronic systolic (congestive) heart failure ([place]) 10/01/2015  . Encounter for fitting or adjustment of implantable cardioverter-defibrillator (ICD)   . Ileus, postoperative ([place])   . Impaired nasal gastric feeding tube   . Impaired nasogastric feeding tube   . Ileus ([place])   . ESRD (end stage renal disease) ([place])   . Gallstones   . Gallbladder calculus 06/15/2015  . Numbness   . RUQ abdominal pain   . Diabetes mellitus with complication ([place])   . Hypertensive urgency 08/03/2014  . Controlled type 2 diabetes with neuropathy ([place]) 08/03/2014  . Obesity (BMI 30-39.9) 08/03/2014  . Bilateral leg pain 08/03/2014  . DM (diabetes mellitus), type 2 with renal complications ([place]) 00/45/9977  . Essential hypertension, benign 06/29/2014  . Hepatic cirrhosis ([place]) 11/28/2013  . Hepatitis C antibody test positive 11/28/2013  . Cirrhosis ([place])   . Type 2 diabetes mellitus with hyperglycemia ([place])   . Diabetes mellitus with hypoglycemia ([place]) 04/29/2013  . Hyperlipemia   . ICD - Single chamber ICD-St.Jude 1411-36C Ellipse VR-CorVue-Merlin (DOI: 03/24/2013) in situ 03/24/2013  . History of ventricular fibrillation 03/23/2013  . Ventricular fibrillation ([place]) 03/08/2013  . Seizure ([place]) 03/07/2013  . Acute respiratory failure with hypoxia ([place]) 03/07/2013   PCP:  System, Pcp Not In Pharmacy:   [facility] ([address]), [place] - [place] [address] [place] ([place]) [place] [address] Phone: [phone] Fax: [phone]  [facility] [place] [place] - [address] [place] [address] Phone: [phone] Fax: [phone]     Social Determinants of Health ([employer])  Interventions    Readmission Risk Interventions Readmission Risk Prevention Plan 11/04/2018 09/20/2018  Transportation Screening Complete Complete  HRI or [place] - Complete  Social Work Consult for [place] Planning/Counseling - Complete  Medication Review Press photographer) Complete Complete  Some recent data might be hidden

## 2018-11-04 NOTE — Procedures (Signed)
Central Venous Catheter Insertion Procedure Note Eric [name] [id number] 1955/08/20  Procedure: Insertion of Central Venous Catheter Indications: CRRT  Procedure Details Consent: Risks of procedure as well as the alternatives and risks of each were explained to the (patient/caregiver).  Consent for procedure obtained. Time Out: Verified patient identification, verified procedure, site/side was marked, verified correct patient position, special equipment/implants available, medications/allergies/relevent history reviewed, required imaging and test results available.  Performed  Maximum sterile technique was used including antiseptics, cap, gloves, gown, hand hygiene, mask and sheet. Skin prep: Chlorhexidine; local anesthetic administered A antimicrobial bonded/coated triple lumen trialysis catheter was placed in the right internal jugular vein under US guidance using the Seldinger technique. Guidewire confirmed in the vessel with [place].  Evaluation Blood flow good Complications: No apparent complications Patient did tolerate procedure well. Chest X-ray ordered to verify placement.  CXR: pending.  Eric [name] 11/04/2018, 11:18 AM

## 2018-11-04 NOTE — Plan of Care (Signed)
  Problem: Clinical Measurements: Goal: Respiratory complications will improve Outcome: Progressing [name] lungs are clear and diminished. Oxygen saturation has maintained in the high 90s to 100% on 40% fio2.  Goal: Cardiovascular complication will be avoided Outcome: Progressing  [name] heart rate has mantained in the 70s to 80s, ventricular pacing throughout the night. Blood pressure decreased just now, [facility] NOTIFIED).  Problem: Nutrition: Goal: Adequate nutrition will be maintained Outcome: Progressing  [name] feeds have been increased to 40, tolerating well. No emesis noted. Problem: Elimination: Goal: Will not experience complications related to bowel motility Outcome: Progressing Pt has received scheduled lactulose, pt has had a couple large bowel movements. Bowel sounds are now hypoactive.  Pt responds to voice at times, does move his toes when instructed. Pt does not open his eye nor does he move his upper extremities. Pt does react to pain. Pt grimaces when turning or oral care. Pt bites down on ETT, desynchronizing the ventilator. Pt does have a gag reflex, pupils are equal and reactive. Nose continues to bleed, saline moistened gauze has been changed every couple hours. Moderate amount of blood oral secretions noted. Bed is in lowest position and bed alarm is on. Pt has been maintained at 37 degrees with cooling pads. Will continue to monitor.

## 2018-11-04 NOTE — Progress Notes (Signed)
30 mg Fentanyl wasted in stericycle. Mateo RN witnessed with Engineer, maintenance (IT)

## 2018-11-04 NOTE — Progress Notes (Signed)
[facility] ROUNDING NOTE   Subjective:   This is a 63 year old history of coronary artery disease status post ICD with history of cardiac arrest.  Cirrhosis and history of hepatitis C with ascites requiring monthly paracentesis, end-stage renal disease, hypertension diabetes mellitus type 2.  On 10/29/202021 he was brought to the emergency room with chest pain and whilst in the emergency room sustained a acute cardiac arrest with respiratory insufficiency requiring brief period of chest compressions.   He continues to be intubated at this time and has not received dialysis since admission  Blood pressure 142/67 pulse 70 temperature 98.4 O2 sats 100% FiO2 40% on ventilator.  Sodium 131 potassium 3.9 chloride 92 CO2 21 BUN 54 creatinine 9 glucose 149 calcium 8.3 phosphorus 5.9 albumin 2.3 INR 1.8.  WBC 7.9 hemoglobin 11.2 platelets 89  2D echo EF 40 to 45% mildly reduced LV function.  CT scan chest no pulmonary embolus aortic dissection right upper lobe infiltrate with bronchograms.  Amiodarone 400 mg daily, duo nebs 4 times daily, lactulose 10 g 3 times daily, Protonix 40 mg daily,   IV Unasyn 1.5 g every 12 hours, IV Levophed    Objective:  Vital signs in last 24 hours:  Temp:  [98.4 F (36.9 C)-98.8 F (37.1 C)] 98.4 F (36.9 C) (10/12 0300) Pulse Rate:  [70-81] 70 (10/12 0600) Resp:  [20-25] 22 (10/12 0119) BP: (84-127)/(39-67) 127/65 (10/12 0119) SpO2:  [95 %-100 %] 100 % (10/12 0600) FiO2 (%):  [40 %] 40 % (10/12 0400) Weight:  [87.8 kg] 87.8 kg (10/12 0418)  Weight change: 2.07 kg Filed Weights   11/05/2018 0842 11/03/18 0500 11/04/18 0418  Weight: 85.7 kg 86 kg 87.8 kg    Intake/Output: I/O last 3 completed shifts: In: 1551.4 [I.V.:1094.8; NG/GT:220; IV Piggyback:236.5] Out: 30 [Urine:30]   Intake/Output this shift:  No intake/output data recorded.  General: Chronically ill-appearing man laying in bed intubated, sedated  HEENT: ET tube and OG tube in  place  Cardiovascular: Regular rate and rhythm, pacing.  No murmurs.   Lungs: Improved rhonchi, less secretions from ET tube. Abdomen: Distended, soft, nontender Musculoskeletal: Left lower extremity pitting edema, mildly improved since yesterday.  No clubbing.  Right upper arm fistula with thrill. Skin: Chronic discoloration bilateral lower extremities.   Basic Metabolic Panel: Recent Labs  Lab 11/22/2018 0727  11/08/2018 0753  11/18/2018 1253 11/17/2018 1436 11/03/18 0324 11/03/18 0510 11/04/18 0500  NA 134*  --  131*   < > 130* 132* 131* 131* 131*  K 3.5  --  4.1   < > 3.5 3.8 3.8 3.8 3.9  CL 92*  --  92*  --   --  92* 92*  --  92*  CO2  --   --  23  --   --  21* 21*  --  21*  GLUCOSE 89  --  128*  --   --  160* 152*  --  149*  BUN 43*  --  37*  --   --  42* 45*  --  54*  CREATININE 7.20*  --  7.24*  --   --  7.63* 8.23*  --  9.00*  CALCIUM  --    < > 8.7*  --   --  9.1 8.7*  --  8.3*  MG  --   --  2.2  --   --   --  2.3  --   --   PHOS  --   --  5.0*  --   --   --  5.1*  --  5.9*   < > = values in this interval not displayed.    Liver Function Tests: Recent Labs  Lab 10/26/2018 0753 11/03/18 0324 11/04/18 0500  AST 31 31  --   ALT 15 13  --   ALKPHOS 52 47  --   BILITOT 1.0 0.7  --   PROT 7.4 6.7  --   ALBUMIN 2.8* 2.4* 2.3*   Recent Labs  Lab 10/26/2018 1436  LIPASE 25  AMYLASE 132*   Recent Labs  Lab 11/22/2018 0930  AMMONIA 72*    CBC: Recent Labs  Lab 11/08/2018 0803  11/06/2018 1220 11/11/2018 1253 11/17/2018 1435 11/03/18 0324 11/03/18 0510  WBC 6.8  --   --   --  5.1 7.9  --   NEUTROABS 3.4  --   --   --   --   --   --   HGB 9.6*   < > 12.6* 12.2* 10.1* 9.9* 11.2*  HCT 31.6*   < > 37.0* 36.0* 30.9* 31.1* 33.0*  MCV 103.6*  --   --   --  95.7 96.6  --   PLT 58*  --   --   --  63* 89*  --    < > = values in this interval not displayed.    Cardiac Enzymes: Recent Labs  Lab 11/19/2018 0753  CKTOTAL 116    BNP: Invalid input(s): POCBNP  CBG: Recent  Labs  Lab 11/03/18 1204 11/03/18 1609 11/03/18 2019 11/03/18 2352 11/04/18 0409  GLUCAP 104* 109* 95 113* 122*    Microbiology: Results for orders placed or performed during the hospital encounter of 10/27/2018  SARS Coronavirus 2 by RT PCR (hospital order, performed in [place] hospital lab) Nasopharyngeal Nasopharyngeal Swab     Status: None   Collection Time: 11/03/2018  7:32 AM   Specimen: Nasopharyngeal Swab  Result Value Ref Range Status   SARS Coronavirus 2 NEGATIVE NEGATIVE Final    Comment: (NOTE) If result is NEGATIVE SARS-CoV-2 target nucleic acids are NOT DETECTED. The SARS-CoV-2 RNA is generally detectable in upper and lower  respiratory specimens during the acute phase of infection. The lowest  concentration of SARS-CoV-2 viral copies this assay can detect is 250  copies / mL. A negative result does not preclude SARS-CoV-2 infection  and should not be used as the sole basis for treatment or other  patient management decisions.  A negative result may occur with  improper specimen collection / handling, submission of specimen other  than nasopharyngeal swab, presence of viral mutation(s) within the  areas targeted by this assay, and inadequate number of viral copies  (<250 copies / mL). A negative result must be combined with clinical  observations, patient history, and epidemiological information. If result is POSITIVE SARS-CoV-2 target nucleic acids are DETECTED. The SARS-CoV-2 RNA is generally detectable in upper and lower  respiratory specimens dur ing the acute phase of infection.  Positive  results are indicative of active infection with SARS-CoV-2.  Clinical  correlation with patient history and other diagnostic information is  necessary to determine patient infection status.  Positive results do  not rule out bacterial infection or co-infection with other viruses. If result is PRESUMPTIVE POSTIVE SARS-CoV-2 nucleic acids MAY BE PRESENT.   A presumptive  positive result was obtained on the submitted specimen  and confirmed on repeat testing.  While 2019 novel coronavirus  (SARS-CoV-2) nucleic acids may  be present in the submitted sample  additional confirmatory testing may be necessary for epidemiological  and / or clinical management purposes  to differentiate between  SARS-CoV-2 and other Sarbecovirus currently known to infect humans.  If clinically indicated additional testing with an alternate test  methodology [phone]) is advised. The SARS-CoV-2 RNA is generally  detectable in upper and lower respiratory sp ecimens during the acute  phase of infection. The expected result is Negative. Fact Sheet for Patients:  StrictlyIdeas.no Fact Sheet for Healthcare Providers: BankingDealers.co.za This test is not yet approved or cleared by the [place] FDA and has been authorized for detection and/or diagnosis of SARS-CoV-2 by FDA under an Emergency Use Authorization (EUA).  This EUA will remain in effect (meaning this test can be used) for the duration of the COVID-19 declaration under Section 564(b)(1) of the Act, 21 U.S.C. section 360bbb-3(b)(1), unless the authorization is terminated or revoked sooner. Performed at [facility], [place] [address], [place], [place] [address]   Culture, blood (routine x 2)     Status: None (Preliminary result)   Collection Time: 11/13/2018  9:30 AM   Specimen: BLOOD LEFT WRIST  Result Value Ref Range Status   Specimen Description BLOOD LEFT WRIST  Final   Special Requests   Final    BOTTLES DRAWN AEROBIC AND ANAEROBIC Blood Culture adequate volume   Culture   Final    NO GROWTH 1 DAY Performed at [facility], [place] [address] [place], [place] [address]    Report Status PENDING  Incomplete  Culture, respiratory (tracheal aspirate)     Status: None (Preliminary result)   Collection Time: 10/29/2018 10:27 AM   Specimen: Tracheal Aspirate;  Respiratory  Result Value Ref Range Status   Specimen Description TRACHEAL ASPIRATE  Final   Special Requests NONE  Final   Gram Stain   Final    ABUNDANT WBC PRESENT, PREDOMINANTLY PMN MODERATE GRAM POSITIVE RODS MODERATE GRAM NEGATIVE RODS FEW GRAM POSITIVE COCCI IN PAIRS Performed at [facility], [address] [place], [place] [address]    Culture PENDING  Incomplete   Report Status PENDING  Incomplete  Culture, blood (routine x 2)     Status: None (Preliminary result)   Collection Time: 11/16/2018 12:18 PM   Specimen: BLOOD  Result Value Ref Range Status   Specimen Description BLOOD SITE NOT SPECIFIED  Final   Special Requests   Final    BOTTLES DRAWN AEROBIC AND ANAEROBIC Blood Culture adequate volume   Culture   Final    NO GROWTH 1 DAY Performed at [facility], [place] [address], [place], [place] [address]    Report Status PENDING  Incomplete  Culture, blood (routine x 2)     Status: None (Preliminary result)   Collection Time: 11/21/2018 12:54 PM   Specimen: BLOOD  Result Value Ref Range Status   Specimen Description BLOOD A-LINE  Final   Special Requests   Final    BOTTLES DRAWN AEROBIC AND ANAEROBIC Blood Culture adequate volume   Culture   Final    NO GROWTH 1 DAY Performed at [facility], [place] [address], [place], [place] [address]    Report Status PENDING  Incomplete  MRSA PCR Screening     Status: None   Collection Time: 11/03/18  6:11 PM   Specimen: Nasal Mucosa; Nasopharyngeal  Result Value Ref Range Status   MRSA by PCR NEGATIVE NEGATIVE Final    Comment:  The GeneXpert MRSA Assay (FDA approved for NASAL specimens only), is one component of a comprehensive MRSA colonization surveillance program. It is not intended to diagnose MRSA infection nor to guide or monitor treatment for MRSA infections. Performed at [facility], [place] [address], [place], [place] [address]     Coagulation Studies: Recent Labs     10/31/2018 3953 11/04/2018 1435 11/03/18 0841 11/04/18 0524  LABPROT 20.8* 21.7* 21.2* 20.7*  INR 1.8* 1.9* 1.9* 1.8*    Urinalysis: No results for input(s): COLORURINE, LABSPEC, PHURINE, GLUCOSEU, HGBUR, BILIRUBINUR, KETONESUR, PROTEINUR, UROBILINOGEN, NITRITE, LEUKOCYTESUR in the last 72 hours.  Invalid input(s): APPERANCEUR    Imaging: Dg Abd 1 View  Result Date: 11/18/2018 CLINICAL DATA:  Nasogastric tube placement EXAM: ABDOMEN - 1 VIEW COMPARISON:  June 29, 2015 FINDINGS: The enteric tube appears to terminate beyond the gastric body and may reside at the level of the gastric antrum or proximal duodenum. The tip is not clearly visualized on this exam. The visualized bowel gas pattern is nonobstructive and nonspecific IMPRESSION: The enteric tube appears to terminate beyond the gastric body and may reside at the level of the gastric antrum or proximal duodenum. The tip is not clearly visualized on this exam. Electronically Signed   By: Constance Holster M.D.   On: 11/18/2018 18:27   Ct Head Wo Contrast  Result Date: 11/04/2018 CLINICAL DATA:  Altered level of consciousness, chest pain EXAM: CT HEAD WITHOUT CONTRAST TECHNIQUE: Contiguous axial images were obtained from the base of the skull through the vertex without intravenous contrast. COMPARISON:  03/18/2015 FINDINGS: Brain: Chronic ventriculomegaly, largely stable. No new midline shift. No evidence of acute infarction, hemorrhage, extra-axial collection, or mass. Vascular: Atherosclerotic and physiologic intracranial calcifications. Skull: Normal. Negative for fracture or focal lesion. Sinuses/Orbits: Mucoperiosteal thickening in the right maxillary sinus. Fluid level in the sphenoid sinus. Other: None IMPRESSION: 1. Negative for bleed or other acute intracranial process. 2. Stable chronic ventriculomegaly. 3. Right maxillary and sphenoid sinus disease. Electronically Signed   By: Lucrezia Europe M.D.   On: 11/20/2018 09:02   Dg Chest Port 1  View  Result Date: 11/04/2018 CLINICAL DATA:  Endotracheal tube. EXAM: PORTABLE CHEST 1 VIEW COMPARISON:  November 02, 2018. FINDINGS: Stable cardiomegaly. Endotracheal tube is unchanged in position. Nasogastric tube is seen entering the stomach. Left-sided pacemaker is unchanged in position. No pneumothorax is noted. Mild bibasilar subsegmental atelectasis is noted. No pleural effusion is noted. Bony thorax is unremarkable. IMPRESSION: Endotracheal and nasogastric tubes are in grossly good position. Mild bibasilar subsegmental atelectasis is noted. Electronically Signed   By: Marijo Conception M.D.   On: 11/04/2018 07:21   Ct Angio Chest/abd/pel For Dissection W And/or W/wo  Result Date: 10/24/2018 CLINICAL DATA:  63 year old male with chest pain, cardiopulmonary arrest. Additional clinical history includes HCV cirrhosis and end-stage renal disease on hemodialysis. EXAM: CT ANGIOGRAPHY CHEST, ABDOMEN AND PELVIS TECHNIQUE: Multidetector CT imaging through the chest, abdomen and pelvis was performed using the standard protocol during bolus administration of intravenous contrast. Multiplanar reconstructed images and MIPs were obtained and reviewed to evaluate the vascular anatomy. CONTRAST:  182m OMNIPAQUE IOHEXOL 350 MG/ML SOLN COMPARISON:  Prior CT abdomen and pelvis 08/20/2018 FINDINGS: CTA CHEST FINDINGS Cardiovascular: Adequate opacification of the thoracic aorta and pulmonary arteries. 2 vessel aortic arch. The right brachiocephalic and left common carotid artery share a common origin. Scattered heterogeneous atherosclerotic plaque. No evidence of aneurysm. The aortic root is unremarkable. Patient is status post multivessel CABG.  The visualized grafts appear patent. Calcifications present within the native coronary arteries. The main pulmonary artery is normal in caliber. Evaluation of the segmental and subsegmental arteries limited by respiratory motion artifact. No evidence of filling defect to suggest  acute pulmonary embolus. Cardiomegaly with right heart enlargement. Cardiac rhythm maintenance device. The lead terminates in the right ventricle. No significant pericardial effusion. Mediastinum/Nodes: No mediastinal mass or suspicious adenopathy. Unremarkable thyroid gland. The esophagus conveys a gastric tube in the stomach. Lungs/Pleura: Small layering right pleural effusion. Trace layering left pleural effusion. Associated atelectasis. Greater than expected volume of atelectasis in the posterior right upper lobe. There is some associated bronchial wall thickening. This may represent small volume mucous plugging versus an early infectious/inflammatory process such as bronchopneumonia. 6 mm subpleural pulmonary nodule in the periphery of the right middle lobe remain unchanged and is likely benign. No evidence of pneumothorax. Musculoskeletal: No acute fracture or aggressive appearing lytic or blastic osseous lesion. No rib fractures visualized. Healed median sternotomy. Review of the MIP images confirms the above findings. CTA ABDOMEN AND PELVIS FINDINGS VASCULAR Aorta: Normal in caliber. Mild atherosclerotic plaque. No evidence of dissection or penetrating ulcer. Celiac: Mild median arcuate ligament compression. No evidence of aneurysm or dissection. SMA: Patent without evidence of aneurysm, dissection, vasculitis or significant stenosis. Renals: Solitary right kidney. The renal artery is patent and unremarkable. No evidence of aneurysm or fibromuscular dysplasia. IMA: Patent without evidence of aneurysm, dissection, vasculitis or significant stenosis. Inflow: Scattered atherosclerotic plaque without significant stenosis or occlusion. No evidence of aneurysm or dissection. Veins: No obvious venous abnormality within the limitations of this arterial phase study. Review of the MIP images confirms the above findings. NON-VASCULAR Hepatobiliary: Hepatic cirrhosis. Mildly dilated intrahepatic IVC and hepatic veins  suggests elevated right heart pressures. The gallbladder is surgically absent. No arterially enhancing liver lesion. Pancreas: Unremarkable. No pancreatic ductal dilatation or surrounding inflammatory changes. Spleen: Normal in size without focal abnormality. Adrenals/Urinary Tract: Mild adreniform thickening bilaterally. No discrete nodules. Solitary right kidney. No evidence of hydronephrosis, nephrolithiasis or enhancing renal mass. The left kidney is visualized but is severely atretic. Unremarkable ureters and bladder. Stomach/Bowel: Colonic diverticular disease without CT evidence of active inflammation. No evidence of obstruction or focal bowel wall thickening. Normal appendix in the right lower quadrant. The terminal ileum is unremarkable. Lymphatic: No suspicious lymphadenopathy. Reproductive: Prostate gland is unremarkable. Other: Moderate to large volume peritoneal ascites. Additionally, the greater omentum is diffusely edematous. With internal fluid densities. Musculoskeletal: No acute fracture or aggressive appearing lytic or blastic osseous lesion. Review of the MIP images confirms the above findings. IMPRESSION: CTA CHEST 1. No evidence of aortic dissection, pulmonary embolism or other acute vascular abnormality. 2. Cardiomegaly with evidence of elevated right heart pressures. 3. Advanced native coronary artery disease with surgical changes of prior multivessel CABG. The visualized portions of the bypass grafts appear patent. 4. Small right and trace left pleural effusions. 5. Greater than expected atelectasis in the dependent aspect of the right upper lobe. This may be secondary to recent cardiac arrest, or, in the appropriate clinical setting, reflect early bronchopneumonia. CTA ABD/PELVIS 1. No evidence of abdominal aortic aneurysm, dissection or other acute vascular abnormality. 2. Incidental note is made of mild median arcuate ligament compression the origin of the celiac artery. 3.  Aortic  Atherosclerosis (ICD10-170.0). 4. Hepatic cirrhosis with moderate to large volume ascites. 5. Colonic diverticular disease without CT evidence of active inflammation. Signed, Criselda Peaches, MD, [facility]  [facility] Electronically Signed   By: Jacqulynn Cadet M.D.   On: 11/17/2018 09:13     Medications:   . sodium chloride    . sodium chloride    . sodium chloride    . ampicillin-sulbactam (UNASYN) IV Stopped (11/04/18 0405)  . fentaNYL infusion INTRAVENOUS Stopped (11/03/18 0759)  . norepinephrine (LEVOPHED) Adult infusion 7 mcg/min (11/04/18 0600)  . propofol (DIPRIVAN) infusion 60 mcg/kg/min (11/04/18 0600)   . amiodarone  400 mg Per Tube Daily  . chlorhexidine gluconate (MEDLINE KIT)  15 mL Mouth Rinse BID  . Chlorhexidine Gluconate Cloth  6 each Topical Daily  . feeding supplement (VITAL HIGH PROTEIN)  1,000 mL Per Tube Q24H  . influenza vac split quadrivalent PF  0.5 mL Intramuscular Tomorrow-1000  . ipratropium-albuterol  3 mL Nebulization QID  . lactulose  10 g Per Tube TID  . mouth rinse  15 mL Mouth Rinse 10 times per day  . pantoprazole sodium  40 mg Per Tube QHS   Place/Maintain arterial line **AND** sodium chloride, fentaNYL, labetalol  Assessment/ Plan:   ESRD -usually Tuesday Thursday Saturday dialysis.  Has not received dialysis since being hospitalized 11/08/2018.  Patient does not appear to be stable hemodynamically.  I would recommend that CRRT is initiated.  Acute hypoxic respiratory failure following cardiac arrest patient intubated  Status post cardiac arrest due to excessive sedation reduced LV function appreciate assistance from Dr. Einar Gip  Shock continues on pressors IV Levophed.  Continues on Unasyn 1.5 g every 12 hours  Acute encephalopathy.  Cirrhosis due to hepatitis C has required paracenteses  Volume will look at gently removing volume with CRRT  Hyponatremia stable will follow.    LOS: [place] _0 _1 :46 AM

## 2018-11-04 NOTE — Plan of Care (Deleted)
  Problem: Education: Goal: Knowledge of General Education information will improve Description: Including pain rating scale, medication(s)/side effects and non-pharmacologic comfort measures Outcome: Progressing  Pt is alert, oriented, asks appropriate questions related to medications and verbalizes understanding of treatment plan. Problem: Health Behavior/Discharge Planning: Goal: Ability to manage health-related needs will improve Outcome: Progressing  Pt verbalizes understanding of needing to quit smoking to improve her overall wellness.  Problem: Clinical Measurements: Goal: Respiratory complications will improve 11/04/2018 0644 by Dewayne Hatch, RN Outcome: Progressing Pt has had some minor expiratory wheezing in her upper lobes, she has denied shortness of breath and has been able to rest with her eyes closed despite wheezing. Pt has no signs of pain or discomfort.  Problem: Activity: Goal: Risk for activity intolerance will decrease Outcome: Progressing  Pt ambulated around the hall once this morning and up to the bathroom. She tolerates activity well and is learning to pace herself. Problem: Nutrition: Goal: Adequate nutrition will be maintained Outcome: Progressing  Pt has not had any episodes of nausea or vomiting. She is tolerating food well and looks forward to her meals. Problem: Elimination: Goal: Will not experience complications related to bowel motility Outcome: Progressing  Pt has had moderate stool output through her colostomy, pt emptied and cleaned her bag this morning.  Goal: Cardiovascular complication will be avoided Outcome: Progressing [name] heart rate has maintained NSR in the 70s and 80s throughout the night, blood pressure has been stable.

## 2018-11-04 NOTE — Progress Notes (Signed)
PULMONARY / CRITICAL CARE MEDICINE   NAME:  Eric [name], MRN:  [id number], DOB:  April 14, 1955, LOS: 2 ADMISSION DATE:  11/11/2018,  REFERRING MD: 11 department physician, CHIEF COMPLAINT: Status post- arrest  BRIEF HISTORY:    63 year old male with multiple medical comorbidities of cardiac arrest most likely secondary to respiratory event narcotics intubated in the emergency department on 11/08/2018. HISTORY OF PRESENT ILLNESS   63 year old with a plethora of health issues with included although not exclusive of hepatitis C, coronary artery disease status post ICD with a history of cardiac arrest.  Cirrhosis with normal ascites requiring monthly paracentesis.  End-stage renal disease but the right forearm AV graft.  Hypertension.  Type 2 diabetes mellitus.  He was in his usual state of poor health until 10/14/202021 is having significant pain primarily chest EMS was activated he was placed in the unit fentanyl 50 mcg notable was given to titrate his pain away.  Unfortunately 200 mcg in his right emergency department related to what is presumed to cardiac arrest respiratory insufficiency.  He required a short period of chest compressions with return of spontaneous circulation. At the time of this dictation he is not requiring vasoactive pressors.  Still on oral mechanical monitory support with FiO2 down down to 40%.  He did have some bloody secretions overnight.  He does have cirrhosis and there is has a coagulopathic state. Pulmonary critical care asked to admit.  We will not pursue temperature guided hypothermia due to his multiple comorbidities.  Currently he is a full code with his multiple health issues consideration may be made to address goals of care in the near future. SIGNIFICANT PAST MEDICAL HISTORY   Hepatitis C positive Diabetes mellitus Coronary artery disease status post cardiac arrest x2 AICD in place Hypertension Cirrhosis Congestive heart failure End-stage renal  disease  SIGNIFICANT EVENTS:  11/17/2018 cardiac arrest STUDIES:   11/10/2018 echo- LVEF 40 to 45% with mildly reduced LV function.  Normal RV size and function, normal atria.  Moderate to severe TR, otherwise normal valves. (Comparison 09/13/2018 echo- LVEF 55 to 60%, severely reduced RV systolic function)  CTA chest-no PE or aortic dissection, right upper lobe infiltrate with air bronchograms.  CULTURES:  10/24/2018 blood cultures x2>> never drawn 10/26/2018 urine culture>> 11/03/2018 respiratory cultures- abundant PMN, moderate GPR and GNR, few GPC   ANTIBIOTICS:  10/24/2018 Unasyn for at least 3 days secondary to questionable aspiration while chest compressions>>  LINES/TUBES:  11/13/2018 endotracheal.>> 11/11/2018 right EJ>> CONSULTANTS:  11/02/2018 cardiology SUBJECTIVE:  [name] became more responsive, opening eyes spontaneously and able to slightly respond to verbal stimulation.  Overnight heavily sedated with resulting hypotension, requiring peripheral vasopressors to be started.  CONSTITUTIONAL: BP 113/69 (BP Location: Left Arm)   Pulse 71   Temp 99 F (37.2 C) (Core)   Resp 17   Ht 5\' 6"  (1.676 m)   Wt 87.8 kg   SpO2 100%   BMI 31.24 kg/m   I/O last 3 completed shifts: In: 1551.4 [I.V.:1094.8; NG/GT:220; IV Piggyback:236.5] Out: 30 [Urine:30]     Vent Mode: PSV;CPAP FiO2 (%):  [40 %] 40 % Set Rate:  [14 bmp] 14 bmp Vt Set:  [450 mL] 450 mL PEEP:  [5 cmH20-8 cmH20] 5 cmH20 Pressure Support:  [12 cmH20] 12 cmH20 Plateau Pressure:  [24 XLK44-01 cmH20] 27 cmH20  PHYSICAL EXAM: General: Chronically ill-appearing man laying in bed intubated, not sedated.  Less mucosal bleeding from his nose and mouth.   Neuro: RASS -5, intermittently attempting to open  eyes with significant stimulation.  No spontaneous movement, withdrawing from pain. HEENT: ET tube and OG tube in place, less oral bleeding.  Clean gauze packed in his right nare.  Eyes icteric. Lungs:  Rhonchi anteriorly on the right, blood-tinged purulent secretions from ET tube. Abdomen: Distended, soft, nontender-similar to previous exams Musculoskeletal: No significant pitting edema, right upper extremity fistula with thrill.  No cyanosis. Skin: Chronic discoloration bilateral shins and feet.  No rashes.  RESOLVED PROBLEM LIST   ASSESSMENT AND PLAN   Acute hypoxic respiratory failure following cardiac arrest.  Respiratory alkalosis due to overbreathing the ventilator despite reduction in tidal volume.  Likely this is due to liver disease. -Continue low tidal volume ventilation-7 cc/kg ideal body weight due to persistent tachypnea and respiratory alkalosis.  Goal plateau less than 30 and driving pressure less than 15 -Daily SAT and SBT.  Would ideally minimize sedation as much as possible. -We will likely need a paracentesis prior to extubation. - Unlikely to be a candidate for extubation today due to concern for acute GI bleed -Protonix increased to twice daily  GI bleed-rectal bleeding, no bleeding from OG tube.  No previous EGDs. -FFP to correct mild coagulopathy -Type and screen, family consented over the phone for blood - Currently hemodynamically stable without significant drop in H&H, so no indication for blood transfusion at this time.  We will continue to monitor with serial hemoglobins and monitoring vital signs. -IV PPI twice daily - Consult told our GI-appreciate their assistance. -Continue to hold anticoagulation and antiplatelet medications.  Post cardiac arrest- possibly hypoxic due to excessive sedation. ACS unlikely with echocardiogram with out wall motion abnormalities. Reduced LV function and increased troponin are likely due to chest compressions and cardiac stunning from arrest. -Appreciate cardiology's assistance -Have been holding aspirin and anticoagulation due to bleeding  Shock- likely distributive due to sedation, now resolved. -Norepinephrine as required  to maintain greater than 65.  Now with evidence of GI bleeding, would focus on volume resuscitation first.  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  Previous TTM to 36 degrees, now rewarmed. -Avoid fevers, hypercapnia, hyperglycemia. 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 MELD-Na+ 29.  Elevated ammonia. -Likely will require paracentesis prior to extubation -Continue lactulose  ESRD- [facility] outpatient HD. -Appreciate nephrology's assistance- recommending CRRT.  Needs access placed. -Renally dose medications  Hypertension; currently in shock -Holding PTA antihypertensives given potential for instability related to acute GI bleed  Hyponatremia-chronic, likely due to chronic liver disease -monitor - Poor prognosis and chronic liver disease   Persistent oral bleeding- improved.   -Monitor  Best Practice / Goals of Care / Disposition.   DVT PROPHYLAXIS: Sequential compression hose GI: PPI BID NUTRITION: Tube feeds held MOBILITY: Bedrest GOALS OF CARE: full code FAMILY DISCUSSIONS: Family updated in the phone this morning.  DISPOSITION ICU  LABS  Glucose Recent Labs  Lab 11/03/18 1204 11/03/18 1609 11/03/18 2019 11/03/18 2352 11/04/18 0409 11/04/18 0755  GLUCAP 104* 109* 95 113* 122* 111*    BMET Recent Labs  Lab 10/25/2018 1436 11/03/18 0324 11/03/18 0510 11/04/18 0500  NA 132* 131* 131* 131*  K 3.8 3.8 3.8 3.9  CL 92* 92*  --  92*  CO2 21* 21*  --  21*  BUN 42* 45*  --  54*  CREATININE 7.63* 8.23*  --  9.00*  GLUCOSE 160* 152*  --  149*    Liver Enzymes  Recent Labs  Lab 11/05/2018 0753 11/03/18 0324 11/04/18 0500  AST 31 31  --   ALT 15 13  --   ALKPHOS 52 47  --   BILITOT 1.0 0.7  --   ALBUMIN 2.8* 2.4* 2.3*    Electrolytes Recent Labs  Lab 11/10/2018 0753 11/06/2018 1436 11/03/18 0324 11/04/18 0500  CALCIUM 8.7* 9.1 8.7* 8.3*  MG  2.2  --  2.3  --   PHOS 5.0*  --  5.1* 5.9*    CBC Recent Labs  Lab 11/06/2018 0803  10/28/2018 1435 11/03/18 0324 11/03/18 0510  WBC 6.8  --  5.1 7.9  --   HGB 9.6*   < > 10.1* 9.9* 11.2*  HCT 31.6*   < > 30.9* 31.1* 33.0*  PLT 58*  --  63* 89*  --    < > = values in this interval not displayed.    ABG Recent Labs  Lab 11/21/2018 1220 10/28/2018 1253 11/03/18 0510  PHART 7.607* 7.610* 7.550*  PCO2ART 23.5* 23.8* 28.6*  PO2ART 73.0* 73.0* 124.0*    Coag's Recent Labs  Lab 11/09/2018 1435 11/03/18 0841 11/04/18 0524  APTT 33 33  --   INR 1.9* 1.9* 1.8*    Sepsis Markers Recent Labs  Lab 11/18/2018 1436 11/16/2018 2006 11/03/18 1735 11/03/18 2002  LATICACIDVEN 1.9 2.6* 3.9* 2.1*  PROCALCITON 0.64  --   --   --     Cardiac Enzymes No results for input(s): TROPONINI, PROBNP in the last 168 hours.    This patient is critically ill with multiple organ system failure which requires frequent high complexity decision making, assessment, support, evaluation, and titration of therapies. This was completed through the application of advanced monitoring technologies and extensive interpretation of multiple databases. During this encounter critical care time was devoted to patient care services described in this note for 50 minutes.  Julian Hy, DO 11/04/18 8:41 AM [facility]

## 2018-11-04 NOTE — Consult Note (Signed)
[facility] Consult: 10:28 AM 11/04/2018  LOS: 2 days    Referring Provider: Dr. Noemi Chapel, CCM. Primary Care Physician:  System, Pcp Not In Primary Gastroenterologist: Scarlette Shorts, MD    Reason for Consultation: Hematochezia   HPI: Eric [name] is a 63 y.o. male.  History hypertension.  Diabetes.  Heart failure.  Cardiac arrest, ICD placement.  End-stage renal disease on hemodialysis TTS.   Remote colonoscopy unclear when or where.  No record of colonoscopy in epic. Cirrhosis and on CT scan in 2017.Marland Kitchen  Hepatitis C.  Ascites. 3.7 L paracentesis 08/20/2018.  3 L paracentesis 09/11/2018.  Abdominal wall hernia with SBO 06/2015.  Regarding cirrhosis work-up: hemochromatosis testing negative.  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  No previous history of alcohol abuse, IV drugs, tattoos, transfusion.  Hep a/b naive, received Twinrix 10/02/2018.Marland Kitchen    HCV antibody greater than 11.  Hepatitis A IgM, Hepatitis B surface Ag, hep B core IgM, hep B E antigen, hep B core total antibody all negative. CTAP without contrast 07/2018: Cirrhosis.  Nodularity in the omentum and mesentery of uncertain significance.  Unable to exclude peritoneal mets.  Though no obvious primary lesion identified.  Severe colonic diverticulosis.  Several days of vomiting, nausea, diarrhea.  Has been able to make it to all dialysis sessions.  Presented to the ED 2 days ago with acute cardiac arrest, hypoxic respiratory failure.  Etiology felt to be narcotics leading to respiratory leading to cardiac arrest.  Short period of chest compress with return to spontaneous circulation..  Anoxic encephalopathy.    Labs significant for INR 1.9.  Hemoglobins 10.9.  Platelets 63 >> 94 Other than low albumin, LFTs normal.  Sodium 131  Her to his  cardiac arrest the patient was ambulatory and not confused.  His belly had begun to swell.  Not complaining of vomiting or abdominal pain until a few days prior to admission.    Past Medical History:  Diagnosis Date  . AICD (automatic cardioverter/defibrillator) present   . Cardiac arrest ([place]) 03/07/2013  . Cardiac arrest due to underlying cardiac condition ([place])   . CHF (congestive heart failure) ([place])   . Complication of anesthesia    got too much anesthesia and bp dropped very low Feb, 2019  . Edema   . Encounter for fitting or adjustment of implantable cardioverter-defibrillator (ICD)   . ESRD on dialysis [facility])    began HD in 2018  . History of blood transfusion 1950's   "related to pinal menigitis; HAD 16 OPERATIONS TOTAL"  . History of ventricular fibrillation 03/23/2013  . Hyperlipemia   . Hypertension   . Ileus ([place]) 05/2015  . Myocardial infarction ([place]) 03/2013  . Pain in the chest   . Pneumonia 2016  . Rash and nonspecific skin eruption 06/29/2014  . Scabies infestation 06/29/2014  . Shortness of breath dyspnea   . Type II diabetes mellitus ([place])    type 2  . Ventricular tachycardia [facility])     Past Surgical History:  Procedure Laterality Date  . AV  FISTULA PLACEMENT Right 03/18/2015   Procedure: BRACHIOCEPHALIC ARTERIOVENOUS (AV) FISTULA CREATION;  Surgeon: Angelia Mould, MD;  Location: [place];  Service: Vascular;  Laterality: Right;  . BACK SURGERY  1950's   "for spinal menigitis; HAD 16 OPERATIONS TOTAL"  . CARDIAC CATHETERIZATION    . CHOLECYSTECTOMY N/A 06/17/2015   Procedure: LAPAROSCOPIC CHOLECYSTECTOMY;  Surgeon: Coralie Keens, MD;  Location: [place];  Service: General;  Laterality: N/A;  . CORONARY ARTERY BYPASS GRAFT  1999   cabg x4  . 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 HEAD SURGERY  1950'S   "for spinal menigitis; HAD 16 OPERATIONS TOTAL"  . IMPLANTABLE CARDIOVERTER DEFIBRILLATOR IMPLANT  03/24/2013   STJ single  chamber ICD implanted by Dr Caryl Comes for secondary prevention  . IMPLANTABLE CARDIOVERTER DEFIBRILLATOR IMPLANT N/A 03/24/2013   Procedure: IMPLANTABLE CARDIOVERTER DEFIBRILLATOR IMPLANT;  Surgeon: Deboraha Sprang, MD;  Location: [facility] CATH LAB;  Service: Cardiovascular;  Laterality: N/A;  . INSERTION OF DIALYSIS CATHETER N/A 03/13/2015   Procedure: INSERTION OF TUNNELED DIALYSIS CATHETER;  Surgeon: Conrad Nemaha, MD;  Location: [place];  Service: Vascular;  Laterality: N/A;  . IR PARACENTESIS  08/20/2018  . IR PARACENTESIS  09/11/2018  . LEFT HEART CATHETERIZATION WITH CORONARY/GRAFT ANGIOGRAM  03/07/2013   Procedure: LEFT HEART CATHETERIZATION WITH [name];  Surgeon: Clent Demark, MD;  Location: [facility] CATH LAB;  Service: Cardiovascular;;  . REVISON OF ARTERIOVENOUS FISTULA Right 01/03/2018   Procedure: REVISION OF ARTERIOVENOUS FISTULA;  Surgeon: Waynetta Sandy, MD;  Location: [place];  Service: Vascular;  Laterality: Right;  . REVISON OF ARTERIOVENOUS FISTULA Right 09/16/2018   Procedure: REVISION PLICATION OF ARTERIOVENOUS FISTULA RIGHT ARM;  Surgeon: Angelia Mould, MD;  Location: [place];  Service: Vascular;  Laterality: Right;  . UMBILICAL HERNIA REPAIR N/A 06/24/2015   Procedure: HERNIA REPAIR UMBILICAL ADULT;  Surgeon: Georganna Skeans, MD;  Location: [place];  Service: General;  Laterality: N/A;    Prior to Admission medications   Medication Sig Start Date End Date Taking? Authorizing Provider  acetaminophen (TYLENOL) 325 MG tablet Take 2 tablets (650 mg total) by mouth every 6 (six) hours as needed for mild pain or moderate pain. 07/01/15   Smiley Houseman, MD  Amino Acids-Protein Hydrolys (FEEDING SUPPLEMENT, PRO-STAT SUGAR FREE 64,) LIQD Take 30 mLs by mouth 2 (two) times daily. 09/23/18   Dana Allan I, MD  aspirin EC 81 MG tablet Take 81 mg by mouth daily.     [provider]  calcium acetate (PHOSLO) 667 MG capsule Take 1,334 mg by mouth 3 (three) times  daily with meals.     [provider]  cinacalcet (SENSIPAR) 30 MG tablet Take 30 mg by mouth daily with supper.    [provider]  doxercalciferol (HECTOROL) 4 MCG/2ML injection Inject 1 mL (2 mcg total) into the vein Every Tuesday,Thursday,and Saturday with dialysis. 09/24/18   Bonnell Public, MD  gabapentin (NEURONTIN) 300 MG capsule Take 1 capsule (300 mg total) by mouth 2 (two) times daily. Patient taking differently: Take 300 mg by mouth 2 (two) times daily as needed (pain).  04/01/18   Miquel Dunn, NP  hydrALAZINE (APRESOLINE) 10 MG tablet Take 10 mg by mouth daily as needed (anxiety).    [provider]  metoprolol tartrate (LOPRESSOR) 25 MG tablet Take 1 tablet (25 mg total) by mouth 2 (two) times daily. 05/29/18 10/15/18  Adrian Prows, MD  multivitamin (  RENA-VIT) TABS tablet Take 1 tablet by mouth at bedtime. 07/01/15   Smiley Houseman, MD  pantoprazole (PROTONIX) 40 MG tablet Take 40 mg by mouth as needed (acid reflux).     [provider]  rosuvastatin (CRESTOR) 20 MG tablet Take 1 tablet (20 mg total) by mouth daily at 6 PM. 01/05/18   Kayleen Memos, DO    Scheduled Meds: . sodium chloride   Intravenous Once  . amiodarone  400 mg Per Tube Daily  . chlorhexidine gluconate (MEDLINE KIT)  15 mL Mouth Rinse BID  . Chlorhexidine Gluconate Cloth  6 each Topical Daily  . feeding supplement (VITAL HIGH PROTEIN)  1,000 mL Per Tube Q24H  . influenza vac split quadrivalent PF  0.5 mL Intramuscular Tomorrow-1000  . ipratropium-albuterol  3 mL Nebulization QID  . lactulose  10 g Per Tube TID  . mouth rinse  15 mL Mouth Rinse 10 times per day  . pantoprazole (PROTONIX) IV  40 mg Intravenous Q12H   Infusions: .  prismasol BGK 4/2.5    .  prismasol BGK 4/2.5    . sodium chloride    . sodium chloride    . sodium chloride    . ampicillin-sulbactam (UNASYN) IV    . fentaNYL infusion INTRAVENOUS Stopped (11/03/18 0759)  . norepinephrine  (LEVOPHED) Adult infusion Stopped (11/04/18 0815)  . prismasol BGK 4/2.5    . propofol (DIPRIVAN) infusion Stopped (11/04/18 0815)   PRN Meds: Place/Maintain arterial line **AND** sodium chloride, fentaNYL, heparin, labetalol, sodium chloride   Allergies as of 11/04/2018  . (No Known Allergies)    Family History  Problem Relation Age of Onset  . Heart disease Mother   . Diabetes Mother     Social History   Socioeconomic History  . Marital status: Married    Spouse name: Not on file  . Number of children: 1  . Years of education: Not on file  . Highest education level: Not on file  Occupational History  . Occupation: retired Art therapist  Social Needs  . Financial resource strain: Not on file  . Food insecurity    Worry: Not on file    Inability: Not on file  . Transportation needs    Medical: Not on file    Non-medical: Not on file  Tobacco Use  . Smoking status: Former Smoker    Packs/day: 1.00    Years: 5.00    Pack years: 5.00    Types: Cigarettes  . Smokeless tobacco: Never Used  Substance and Sexual Activity  . Alcohol use: No    Alcohol/week: 0.0 standard drinks  . Drug use: No  . Sexual activity: Not Currently  Lifestyle  . Physical activity    Days per week: Not on file    Minutes per session: Not on file  . Stress: Not on file  Relationships  . Social Herbalist on phone: Not on file    Gets together: Not on file    Attends religious service: Not on file    Active member of club or organization: Not on file    Attends meetings of clubs or organizations: Not on file    Relationship status: Not on file  . Intimate partner violence    Fear of current or ex partner: Not on file    Emotionally abused: Not on file    Physically abused: Not on file    Forced sexual activity: Not on file  Other Topics Concern  .  Not on file  Social History Narrative   Lives at home w/ his wife and daughter   Right-handed   Caffeine: none    REVIEW  OF SYSTEMS: Constitutional: He was walking and talking prior to admission. ENT:  No nose bleeds Pulm: Really does not have trouble breathing CV:  No palpitations, no LE edema.  GU:  No hematuria, no frequency GI: See HPI Heme: No issues with unusual bleeding or bruising prior to current issues Transfusions: Not aware of any previous blood product transfusions. Neuro:  No headaches, no peripheral tingling or numbness Derm:  No itching, no rash or sores.  Endocrine:  No sweats or chills.  No polyuria or dysuria Immunization: Flu shot current Travel:  None beyond local counties in last few months.    PHYSICAL EXAM: Vital signs in last 24 hours: Vitals:   11/04/18 0945 11/04/18 1000  BP:  120/83  Pulse: 81 80  Resp:    Temp:    SpO2: 100% 100%   Wt Readings from Last 3 Encounters:  11/04/18 87.8 kg  10/15/18 85.7 kg  10/09/18 79.7 kg    General: Obese, intubated, sedated Head: No asymmetry, no signs of head trauma Eyes: No scleral icterus.  No conjunctival pallor. Ears: Unable to assess his hearing Nose: No blood from the nose. Mouth: No blood at the mouth. Neck: No JVD, no masses Lungs: Clear bilaterally.  Unlabored breathing on the vent.  There is some bloody secretions from the subglottic tube. Heart: RRR.  No MRG. Abdomen: Distended, tense, bowel sounds hypoactive.  No masses..   Rectal: Blood staining the sheets.  Did not perform DRE.  Fairly large quantity of fresh blood per nurse who cleaned him up earlier Musc/Skeltl: No joint swelling or deformity. Extremities: Slight pedal edema. Neurologic: Unresponsive to exam Skin: No sores or rashes Nodes: No cervical adenopathy   Intake/Output from previous day: 10/11 0701 - 10/12 0700 In: 1018.4 [I.V.:561.9; NG/GT:220; IV Piggyback:236.5] Out: 30 [Urine:30] Intake/Output this shift: Total I/O In: 423.6 [I.V.:143.6; NG/GT:280] Out: -   LAB RESULTS: Recent Labs    11/01/2018 1435 11/03/18 0324 11/03/18 0510  11/04/18 0524  WBC 5.1 7.9  --  3.8*  HGB 10.1* 9.9* 11.2* 10.9*  HCT 30.9* 31.1* 33.0* 33.6*  PLT 63* 89*  --  94*   BMET Lab Results  Component Value Date   NA 131 (L) 11/04/2018   NA 131 (L) 11/03/2018   NA 131 (L) 11/03/2018   K 3.9 11/04/2018   K 3.8 11/03/2018   K 3.8 11/03/2018   CL 92 (L) 11/04/2018   CL 92 (L) 11/03/2018   CL 92 (L) 10/25/2018   CO2 21 (L) 11/04/2018   CO2 21 (L) 11/03/2018   CO2 21 (L) 11/04/2018   GLUCOSE 149 (H) 11/04/2018   GLUCOSE 152 (H) 11/03/2018   GLUCOSE 160 (H) 11/01/2018   BUN 54 (H) 11/04/2018   BUN 45 (H) 11/03/2018   BUN 42 (H) 10/31/2018   CREATININE 9.00 (H) 11/04/2018   CREATININE 8.23 (H) 11/03/2018   CREATININE 7.63 (H) 11/09/2018   CALCIUM 8.3 (L) 11/04/2018   CALCIUM 8.7 (L) 11/03/2018   CALCIUM 9.1 11/22/2018   LFT Recent Labs    10/30/2018 0753 11/03/18 0324 11/04/18 0500  PROT 7.4 6.7  --   ALBUMIN 2.8* 2.4* 2.3*  AST 31 31  --   ALT 15 13  --   ALKPHOS 52 47  --   BILITOT 1.0 0.7  --  PT/INR Lab Results  Component Value Date   INR 1.8 (H) 11/04/2018   INR 1.9 (H) 11/03/2018   INR 1.9 (H) 10/31/2018   Hepatitis Panel No results for input(s): HEPBSAG, HCVAB, HEPAIGM, HEPBIGM in the last 72 hours. C-Diff No components found for: CDIFF Lipase     Component Value Date/Time   LIPASE 25 10/29/2018 1436    Drugs of Abuse     Component Value Date/Time   LABOPIA POSITIVE (A) 10/02/2015 2147   COCAINSCRNUR NONE DETECTED 10/02/2015 2147   LABBENZ NONE DETECTED 10/02/2015 2147   AMPHETMU NONE DETECTED 10/02/2015 2147   THCU NONE DETECTED 10/02/2015 2147   LABBARB NONE DETECTED 10/02/2015 2147     RADIOLOGY STUDIES: Dg Abd 1 View  Result Date: 11/12/2018 CLINICAL DATA:  Nasogastric tube placement EXAM: ABDOMEN - 1 VIEW COMPARISON:  June 29, 2015 FINDINGS: The enteric tube appears to terminate beyond the gastric body and may reside at the level of the gastric antrum or proximal duodenum. The tip is not  clearly visualized on this exam. The visualized bowel gas pattern is nonobstructive and nonspecific IMPRESSION: The enteric tube appears to terminate beyond the gastric body and may reside at the level of the gastric antrum or proximal duodenum. The tip is not clearly visualized on this exam. Electronically Signed   By: Constance Holster M.D.   On: 10/27/2018 18:27   Dg Chest Port 1 View  Result Date: 11/04/2018 CLINICAL DATA:  Endotracheal tube. EXAM: PORTABLE CHEST 1 VIEW COMPARISON:  November 02, 2018. FINDINGS: Stable cardiomegaly. Endotracheal tube is unchanged in position. Nasogastric tube is seen entering the stomach. Left-sided pacemaker is unchanged in position. No pneumothorax is noted. Mild bibasilar subsegmental atelectasis is noted. No pleural effusion is noted. Bony thorax is unremarkable. IMPRESSION: Endotracheal and nasogastric tubes are in grossly good position. Mild bibasilar subsegmental atelectasis is noted. Electronically Signed   By: Marijo Conception M.D.   On: 11/04/2018 07:21      IMPRESSION:   *    Bright red blood per rectum.  No previous history of this. Has diverticulosis on CT.  Could be diverticular bleed or ischemic colitis. Remote colonoscopy, do not have these records.  *     Cirrhosis of the liver.     [place]eld 29.   Elevated ammonia level of 72.Marland Kitchen   Hyponatremia.   Coagulopathy, received FFP today, received 10 mg oral vitamin K yesterday, subcu heparin discontinued...  Thrombocytopenia. Hepatitis C, untreated.  *    Cardiac arrest, respiratory failure.  On ventilator. Has completed [place] cooling protocol. Higher history of cardiac arrest.  ICD in place.  Congestive heart failure.   *     ?  Anoxic encephalopathy.  *      ESRD.  On hemodialysis.    PLAN:     *     PT, INR in the morning.  Follow hemoglobins  *   Observe for now.   Consider Ct or flex sig or colonoscopy for eval  *       Continue Protonix 40 IV twice daily   Azucena Freed   11/04/2018, 10:28 AM Phone [phone]

## 2018-11-05 ENCOUNTER — Encounter (HOSPITAL_COMMUNITY): Payer: Self-pay

## 2018-11-05 ENCOUNTER — Inpatient Hospital Stay (HOSPITAL_COMMUNITY): Payer: Medicare Other

## 2018-11-05 DIAGNOSIS — B1920 Unspecified viral hepatitis C without hepatic coma: Secondary | ICD-10-CM

## 2018-11-05 DIAGNOSIS — K921 Melena: Secondary | ICD-10-CM

## 2018-11-05 DIAGNOSIS — R188 Other ascites: Secondary | ICD-10-CM

## 2018-11-05 LAB — RENAL FUNCTION PANEL
Albumin: 2.4 g/dL — ABNORMAL LOW (ref 3.5–5.0)
Albumin: 2.3 g/dL — ABNORMAL LOW (ref 3.5–5.0)
Albumin: 2.2 g/dL — ABNORMAL LOW (ref 3.5–5.0)
Anion gap: 15 (ref 5–15)
Anion gap: 14 (ref 5–15)
Anion gap: 11 (ref 5–15)
BUN: 31 mg/dL — ABNORMAL HIGH (ref 8–23)
BUN: 30 mg/dL — ABNORMAL HIGH (ref 8–23)
BUN: 23 mg/dL (ref 8–23)
CO2: 22 mmol/L (ref 22–32)
CO2: 22 mmol/L (ref 22–32)
CO2: 24 mmol/L (ref 22–32)
Calcium: 8.4 mg/dL — ABNORMAL LOW (ref 8.9–10.3)
Calcium: 8.1 mg/dL — ABNORMAL LOW (ref 8.9–10.3)
Calcium: 8.1 mg/dL — ABNORMAL LOW (ref 8.9–10.3)
Chloride: 96 mmol/L — ABNORMAL LOW (ref 98–111)
Chloride: 98 mmol/L (ref 98–111)
Chloride: 99 mmol/L (ref 98–111)
Creatinine, Ser: 5.18 mg/dL — ABNORMAL HIGH (ref 0.61–1.24)
Creatinine, Ser: 4.89 mg/dL — ABNORMAL HIGH (ref 0.61–1.24)
Creatinine, Ser: 3.76 mg/dL — ABNORMAL HIGH (ref 0.61–1.24)
GFR calc Af Amer: 12 mL/min — ABNORMAL LOW (ref >60)
GFR calc Af Amer: 13 mL/min — ABNORMAL LOW (ref >60)
GFR calc Af Amer: 18 mL/min — ABNORMAL LOW (ref >60)
GFR calc non Af Amer: 11 mL/min — ABNORMAL LOW (ref >60)
GFR calc non Af Amer: 12 mL/min — ABNORMAL LOW (ref >60)
GFR calc non Af Amer: 16 mL/min — ABNORMAL LOW (ref >60)
Glucose, Bld: 79 mg/dL (ref 70–99)
Glucose, Bld: 87 mg/dL (ref 70–99)
Glucose, Bld: 179 mg/dL — ABNORMAL HIGH (ref 70–99)
Phosphorus: 4 mg/dL (ref 2.5–4.6)
Phosphorus: 4.2 mg/dL (ref 2.5–4.6)
Phosphorus: 3.8 mg/dL (ref 2.5–4.6)
Potassium: 4.3 mmol/L (ref 3.5–5.1)
Potassium: 4.1 mmol/L (ref 3.5–5.1)
Potassium: 4.3 mmol/L (ref 3.5–5.1)
Sodium: 133 mmol/L — ABNORMAL LOW (ref 135–145)
Sodium: 134 mmol/L — ABNORMAL LOW (ref 135–145)
Sodium: 134 mmol/L — ABNORMAL LOW (ref 135–145)

## 2018-11-05 LAB — PREPARE FRESH FROZEN PLASMA: Unit division: 0

## 2018-11-05 LAB — CORTISOL: Cortisol, Plasma: 80 ug/dL

## 2018-11-05 LAB — POCT I-STAT 7, (LYTES, BLD GAS, ICA,H+H)
Acid-base deficit: 3 mmol/L — ABNORMAL HIGH (ref 0.0–2.0)
Bicarbonate: 24.6 mmol/L (ref 20.0–28.0)
Calcium, Ion: 1.08 mmol/L — ABNORMAL LOW (ref 1.15–1.40)
HCT: 29 % — ABNORMAL LOW (ref 39.0–52.0)
Hemoglobin: 9.9 g/dL — ABNORMAL LOW (ref 13.0–17.0)
O2 Saturation: 99 %
Patient temperature: 35.3
Potassium: 4 mmol/L (ref 3.5–5.1)
Sodium: 138 mmol/L (ref 135–145)
TCO2: 26 mmol/L (ref 22–32)
pCO2 arterial: 50.9 mmHg — ABNORMAL HIGH (ref 32.0–48.0)
pH, Arterial: 7.284 — ABNORMAL LOW (ref 7.350–7.450)
pO2, Arterial: 172 mmHg — ABNORMAL HIGH (ref 83.0–108.0)

## 2018-11-05 LAB — MAGNESIUM: Magnesium: 2.3 mg/dL (ref 1.7–2.4)

## 2018-11-05 LAB — GLUCOSE, CAPILLARY
Glucose-Capillary: 117 mg/dL — ABNORMAL HIGH (ref 70–99)
Glucose-Capillary: 122 mg/dL — ABNORMAL HIGH (ref 70–99)
Glucose-Capillary: 133 mg/dL — ABNORMAL HIGH (ref 70–99)
Glucose-Capillary: 144 mg/dL — ABNORMAL HIGH (ref 70–99)
Glucose-Capillary: 45 mg/dL — ABNORMAL LOW (ref 70–99)
Glucose-Capillary: 47 mg/dL — ABNORMAL LOW (ref 70–99)
Glucose-Capillary: 65 mg/dL — ABNORMAL LOW (ref 70–99)
Glucose-Capillary: 75 mg/dL (ref 70–99)
Glucose-Capillary: 81 mg/dL (ref 70–99)

## 2018-11-05 LAB — BODY FLUID CELL COUNT WITH DIFFERENTIAL
Monocyte-Macrophage-Serous Fluid: 8 % — ABNORMAL LOW (ref 50–90)
Neutrophil Count, Fluid: 92 % — ABNORMAL HIGH (ref 0–25)
Total Nucleated Cell Count, Fluid: 2100 cu mm — ABNORMAL HIGH (ref 0–1000)

## 2018-11-05 LAB — BPAM FFP
Blood Product Expiration Date: 202010172359
ISSUE DATE / TIME: 202010121044
Unit Type and Rh: 7300

## 2018-11-05 LAB — CBC
HCT: 30.8 % — ABNORMAL LOW (ref 39.0–52.0)
Hemoglobin: 9.8 g/dL — ABNORMAL LOW (ref 13.0–17.0)
MCH: 30.4 pg (ref 26.0–34.0)
MCHC: 31.8 g/dL (ref 30.0–36.0)
MCV: 95.7 fL (ref 80.0–100.0)
Platelets: 91 10*3/uL — ABNORMAL LOW (ref 150–400)
RBC: 3.22 MIL/uL — ABNORMAL LOW (ref 4.22–5.81)
RDW: 16.3 % — ABNORMAL HIGH (ref 11.5–15.5)
WBC: 11 10*3/uL — ABNORMAL HIGH (ref 4.0–10.5)
nRBC: 0 % (ref 0.0–0.2)

## 2018-11-05 LAB — PROCALCITONIN: Procalcitonin: 24.85 ng/mL

## 2018-11-05 LAB — GLUCOSE, PLEURAL OR PERITONEAL FLUID: Glucose, Fluid: 68 mg/dL

## 2018-11-05 LAB — PROTEIN, PLEURAL OR PERITONEAL FLUID: Total protein, fluid: 5 g/dL

## 2018-11-05 LAB — ALBUMIN, PLEURAL OR PERITONEAL FLUID: Albumin, Fluid: 2 g/dL

## 2018-11-05 MED ORDER — VITAL 1.5 CAL PO LIQD
1000.0000 mL | ORAL | Status: DC
  Administered 2018-11-05: 1000 mL
  Filled 2018-11-05: qty 1000

## 2018-11-05 MED ORDER — VASOPRESSIN 20 UNIT/ML IV SOLN
0.0400 [IU]/min | INTRAVENOUS | Status: DC
  Administered 2018-11-05: 0.03 [IU]/min via INTRAVENOUS
  Filled 2018-11-05 (×2): qty 2

## 2018-11-05 MED ORDER — DEXTROSE 50 % IV SOLN
INTRAVENOUS | Status: AC
  Administered 2018-11-05: 50 mL
  Filled 2018-11-05: qty 50

## 2018-11-05 MED ORDER — B COMPLEX-C PO TABS
1.0000 | ORAL_TABLET | Freq: Every day | ORAL | Status: DC
  Administered 2018-11-05: 1
  Filled 2018-11-05 (×3): qty 1

## 2018-11-05 MED ORDER — DEXTROSE 50 % IV SOLN
INTRAVENOUS | Status: AC
  Administered 2018-11-05: 50 mL via INTRAVENOUS
  Filled 2018-11-05: qty 50

## 2018-11-05 MED ORDER — ALBUMIN HUMAN 25 % IV SOLN
25.0000 g | Freq: Four times a day (QID) | INTRAVENOUS | Status: AC
  Administered 2018-11-05 – 2018-11-06 (×4): 25 g via INTRAVENOUS
  Filled 2018-11-05 (×3): qty 50
  Filled 2018-11-05: qty 100
  Filled 2018-11-05: qty 50

## 2018-11-05 MED ORDER — SODIUM CHLORIDE 0.9% FLUSH: 10.0000 mL | INTRAVENOUS | Status: DC | PRN

## 2018-11-05 MED ORDER — DEXTROSE 50 % IV SOLN
50.0000 mL | Freq: Once | INTRAVENOUS | Status: AC
  Administered 2018-11-05: 12:00:00 50 mL via INTRAVENOUS
  Filled 2018-11-05: qty 50

## 2018-11-05 MED ORDER — SODIUM CHLORIDE 0.9 % IV SOLN
2.0000 g | Freq: Two times a day (BID) | INTRAVENOUS | Status: DC
  Administered 2018-11-05 – 2018-11-07 (×4): 2 g via INTRAVENOUS
  Filled 2018-11-05 (×5): qty 2

## 2018-11-05 MED ORDER — DEXTROSE 10 % IV SOLN
INTRAVENOUS | Status: DC
  Administered 2018-11-05 – 2018-11-06 (×2): via INTRAVENOUS

## 2018-11-05 MED ORDER — HYDROCORTISONE NA SUCCINATE PF 100 MG IJ SOLR
50.0000 mg | Freq: Four times a day (QID) | INTRAMUSCULAR | Status: DC
  Administered 2018-11-05 – 2018-11-07 (×8): 50 mg via INTRAVENOUS
  Filled 2018-11-05 (×8): qty 2

## 2018-11-05 MED ORDER — DEXTROSE 50 % IV SOLN
25.0000 mL | Freq: Once | INTRAVENOUS | Status: AC
  Administered 2018-11-05: 25 mL via INTRAVENOUS
  Filled 2018-11-05: qty 50

## 2018-11-05 MED ORDER — DEXMEDETOMIDINE HCL IN NACL 400 MCG/100ML IV SOLN
0.4000 ug/kg/h | INTRAVENOUS | Status: DC
  Administered 2018-11-05: 0.8 ug/kg/h via INTRAVENOUS
  Administered 2018-11-05 – 2018-11-06 (×3): 1.2 ug/kg/h via INTRAVENOUS
  Filled 2018-11-05 (×5): qty 100

## 2018-11-05 MED ORDER — SODIUM CHLORIDE 0.9% FLUSH
10.0000 mL | Freq: Two times a day (BID) | INTRAVENOUS | Status: DC
  Administered 2018-11-05: 10 mL
  Administered 2018-11-06: 20 mL
  Administered 2018-11-06 – 2018-11-07 (×2): 10 mL

## 2018-11-05 MED ORDER — MIDAZOLAM HCL 2 MG/2ML IJ SOLN
INTRAMUSCULAR | Status: AC
  Administered 2018-11-05: 4 mg
  Filled 2018-11-05: qty 4

## 2018-11-05 NOTE — Progress Notes (Signed)
PULMONARY / CRITICAL CARE MEDICINE   NAME:  Eric [name], MRN:  [id number], DOB:  31-Aug-1955, LOS: 3 ADMISSION DATE:  11/04/2018,  REFERRING MD: 41 department physician, CHIEF COMPLAINT: Status post- arrest  BRIEF HISTORY:    63 year old male with multiple medical comorbidities of cardiac arrest most likely secondary to respiratory event narcotics intubated in the emergency department on 11/13/2018. HISTORY OF PRESENT ILLNESS   63 year old with a plethora of health issues with included although not exclusive of hepatitis C, coronary artery disease status post ICD with a history of cardiac arrest.  Cirrhosis with normal ascites requiring monthly paracentesis.  End-stage renal disease but the right forearm AV graft.  Hypertension.  Type 2 diabetes mellitus.  He was in his usual state of poor health until 10/05/202021 is having significant pain primarily chest EMS was activated he was placed in the unit fentanyl 50 mcg notable was given to titrate his pain away.  Unfortunately 200 mcg in his right emergency department related to what is presumed to cardiac arrest respiratory insufficiency.  He required a short period of chest compressions with return of spontaneous circulation. At the time of this dictation he is not requiring vasoactive pressors.  Still on oral mechanical monitory support with FiO2 down down to 40%.  He did have some bloody secretions overnight.  He does have cirrhosis and there is has a coagulopathic state. Pulmonary critical care asked to admit.  We will not pursue temperature guided hypothermia due to his multiple comorbidities.  Currently he is a full code with his multiple health issues consideration may be made to address goals of care in the near future. SIGNIFICANT PAST MEDICAL HISTORY   Hepatitis C positive Diabetes mellitus Coronary artery disease status post cardiac arrest x2 AICD in place Hypertension Cirrhosis Congestive heart failure End-stage renal  disease  SIGNIFICANT EVENTS:  11/20/2018 cardiac arrest STUDIES:   11/06/2018 echo- LVEF 40 to 45% with mildly reduced LV function.  Normal RV size and function, normal atria.  Moderate to severe TR, otherwise normal valves. (Comparison 09/13/2018 echo- LVEF 55 to 60%, severely reduced RV systolic function)  CTA chest-no PE or aortic dissection, right upper lobe infiltrate with air bronchograms.  CULTURES:  11/06/2018 blood cultures x2>> never drawn 11/23/2018 urine culture>> 11/03/2018 respiratory cultures- abundant PMN, moderate GPR and GNR, few GPC   ANTIBIOTICS:  11/18/2018 Unasyn for at least 3 days secondary to questionable aspiration while chest compressions>>  LINES/TUBES:  11/21/2018 endotracheal.>> 10/25/2018 right EJ>> CONSULTANTS:  11/02/2018 cardiology SUBJECTIVE:  No events, cannot get to comfortable spot on vent, very agitated.  CONSTITUTIONAL: BP 98/63   Pulse 84   Temp 98.6 F (37 C) (Core)   Resp (!) 28   Ht 5\' 6"  (1.676 m)   Wt 84.4 kg   SpO2 99%   BMI 30.03 kg/m   I/O last 3 completed shifts: In: 2173.4 [I.V.:1248.6; Blood:200; NG/GT:325; IV Piggyback:399.9] Out: 2008 [Other:2008]     Vent Mode: PRVC FiO2 (%):  [40 %] 40 % Set Rate:  [14 bmp] 14 bmp Vt Set:  [450 mL] 450 mL PEEP:  [5 cmH20] 5 cmH20 Pressure Support:  [10 cmH20-12 cmH20] 10 cmH20 Plateau Pressure:  [22 cmH20-27 cmH20] 25 cmH20  PHYSICAL EXAM: GEN: restless man in NAD on vent HEENT: ETT in place with minimal secretions,  CV: RRR, ext warm PULM: Grossly clear, + accessory muscle use GI: Protuberant, ascites on [place] EXT: RUE fistula with good thrill, venous stasis changes in LE NEURO: moving all 4 ext  but not to command PSYCH: RASS +3 SKIN: RIJ HD line in place  Sodium and lytes stable on CRRT CBC stable Sugars low CXR pending Echo largely benign  RESOLVED PROBLEM LIST   ASSESSMENT AND PLAN   # Acute hypoxic respiratory failure following cardiac arrest.  # Cardiac  arrest of unclear origin s/p TTM # Encephalopathy combination delirium and hypoxic brain injury # Underlying HepC cirrhosis and portal hypertension, ascites # Persistent hypoglycemia in setting of cirrhosis, possible contribution of sepsis vs. Adrenal insufficiency but less likely # Underlying ESRD # Fluctuating Bps mostly related to sedation needs # Possible GIB vs. Oral bleeding, H/H stable, GI following  - Continue CRRT, even to negative fine with me - Start D10 at 30cc/hr, start hydrocortisone, check cortisol - Continue unasyn, check Pct, ?aspiration I guess not sure what this is for - Paracentesis (send for usual labs) and SBT today, sedation and ETT not helping [place] figure out degree of brain damage - PPI fine for now, one more day of stable H/H and will start DVT ppx  Best Practice / Goals of Care / Disposition.   DVT PROPHYLAXIS: Sequential compression hose GI: PPI BID NUTRITION: Restart TF at trickle MOBILITY: Bedrest GOALS OF CARE: full code FAMILY DISCUSSIONS: Family updated by phone (wife) DISPOSITION ICU pending vent and CRRT liberation  LABS  Glucose Recent Labs  Lab 11/05/18 0010 11/05/18 0039 11/05/18 0234 11/05/18 0423 11/05/18 0742 11/05/18 0743  GLUCAP 47* 117* 75 81 36* 45*    BMET Recent Labs  Lab 11/04/18 0500 11/04/18 1630 11/05/18 0416  NA 131* 131* 133*  K 3.9 4.2 4.3  CL 92* 93* 96*  CO2 21* 21* 22  BUN 54* 48* 31*  CREATININE 9.00* 7.74* 5.18*  GLUCOSE 149* 89 79    Liver Enzymes Recent Labs  Lab 11/23/2018 0753 11/03/18 0324 11/04/18 0500 11/04/18 1630 11/05/18 0416  AST 31 31  --   --   --   ALT 15 13  --   --   --   ALKPHOS 52 47  --   --   --   BILITOT 1.0 0.7  --   --   --   ALBUMIN 2.8* 2.4* 2.3* 2.5* 2.4*    Electrolytes Recent Labs  Lab 11/23/2018 0753  11/03/18 0324 11/04/18 0500 11/04/18 1630 11/05/18 0416  CALCIUM 8.7*   < > 8.7* 8.3* 8.2* 8.4*  MG 2.2  --  2.3  --   --  2.3  PHOS 5.0*  --  5.1* 5.9* 5.1* 4.0    < > = values in this interval not displayed.    CBC Recent Labs  Lab 11/04/18 0524 11/04/18 1325 11/05/18 0416  WBC 3.8* 6.4 11.0*  HGB 10.9* 9.6* 9.8*  HCT 33.6* 30.5* 30.8*  PLT 94* 75* 91*    ABG Recent Labs  Lab 11/21/2018 1220 10/27/2018 1253 11/03/18 0510  PHART 7.607* 7.610* 7.550*  PCO2ART 23.5* 23.8* 28.6*  PO2ART 73.0* 73.0* 124.0*    Coag's Recent Labs  Lab 11/11/2018 1435 11/03/18 0841 11/04/18 0524 11/04/18 1346  APTT 33 33  --   --   INR 1.9* 1.9* 1.8* 1.9*    Sepsis Markers Recent Labs  Lab 11/01/2018 1436 10/25/2018 2006 11/03/18 1735 11/03/18 2002  LATICACIDVEN 1.9 2.6* 3.9* 2.1*  PROCALCITON 0.64  --   --   --     Cardiac Enzymes No results for input(s): TROPONINI, PROBNP in the last 168 hours.    This patient is  critically ill with multiple organ system failure which requires frequent high complexity decision making, assessment, support, evaluation, and titration of therapies. This was completed through the application of advanced monitoring technologies and extensive interpretation of multiple databases. During this encounter critical care time was devoted to patient care services described in this note for 40 minutes.  Candee Furbish, DO 11/05/18 7:50 AM [facility]

## 2018-11-05 NOTE — Progress Notes (Signed)
Chaplain engaged in initial visit with Eric [name] wife. Chaplain offered spiritual presence and prayer.    Chaplain will continue to follow-up.

## 2018-11-05 NOTE — Progress Notes (Signed)
[facility] ROUNDING NOTE   Subjective:   This is a 63 year old history of coronary artery disease status post ICD with history of cardiac arrest.  Cirrhosis and history of hepatitis C with ascites requiring monthly paracentesis, end-stage renal disease, hypertension diabetes mellitus type 2.  On 10/09/202021 he was brought to the emergency room with chest pain and whilst in the emergency room sustained a acute cardiac arrest with respiratory insufficiency requiring brief period of chest compressions.   He continues to be intubated at this time and has not received dialysis since admission  Blood pressure 111/70 pulse 84 temperature 98.7 O2 sats 99% FiO2 40%  Sodium 133 potassium 4.3 chloride 96 CO2 22 BUN 31 creatinine 5.18 glucose 79 calcium 8.4 phosphorus 4.0 magnesium 2.3 albumin 2.4 hemoglobin 9.8 WBC 11.0 platelets 91  2D echo EF 40 to 45% mildly reduced LV function.  CT scan chest no pulmonary embolus aortic dissection right upper lobe infiltrate with bronchograms.  Amiodarone 400 mg daily, duo nebs 4 times daily, lactulose 10 g 3 times daily, Protonix 40 mg daily,  IV Unasyn 3 g every 12 hours, IV Levophed    Objective:  Vital signs in last 24 hours:  Temp:  [97.7 F (36.5 C)-99.7 F (37.6 C)] 98.6 F (37 C) (10/13 0402) Pulse Rate:  [74-84] 84 (10/13 0740) Resp:  [16-28] 28 (10/13 0740) BP: (59-122)/(48-83) 98/63 (10/13 0740) SpO2:  [95 %-100 %] 99 % (10/13 0740) FiO2 (%):  [40 %] 40 % (10/13 0740) Weight:  [84.4 kg] 84.4 kg (10/13 0500)  Weight change: -3.4 kg Filed Weights   11/03/18 0500 11/04/18 0418 11/05/18 0500  Weight: 86 kg 87.8 kg 84.4 kg    Intake/Output: I/O last 3 completed shifts: In: 2173.4 [I.V.:1248.6; Blood:200; NG/GT:325; IV Piggyback:399.9] Out: 2008 [Other:2008]   Intake/Output this shift:  No intake/output data recorded.  General: Chronically ill-appearing man laying in bed intubated, sedated  HEENT: ET tube and OG tube in  place  Cardiovascular: Regular rate and rhythm, pacing.  No murmurs.   Lungs: Improved rhonchi, less secretions from ET tube. Abdomen: Distended, soft, nontender Musculoskeletal: Left lower extremity pitting edema, mildly improved since yesterday.  No clubbing.  Right upper arm fistula with thrill. Skin: Chronic discoloration bilateral lower extremities.   Basic Metabolic Panel: Recent Labs  Lab 10/26/2018 0753  11/09/2018 1436 11/03/18 0324 11/03/18 0510 11/04/18 0500 11/04/18 1630 11/05/18 0416  NA 131*   < > 132* 131* 131* 131* 131* 133*  K 4.1   < > 3.8 3.8 3.8 3.9 4.2 4.3  CL 92*  --  92* 92*  --  92* 93* 96*  CO2 23  --  21* 21*  --  21* 21* 22  GLUCOSE 128*  --  160* 152*  --  149* 89 79  BUN 37*  --  42* 45*  --  54* 48* 31*  CREATININE 7.24*  --  7.63* 8.23*  --  9.00* 7.74* 5.18*  CALCIUM 8.7*  --  9.1 8.7*  --  8.3* 8.2* 8.4*  MG 2.2  --   --  2.3  --   --   --  2.3  PHOS 5.0*  --   --  5.1*  --  5.9* 5.1* 4.0   < > = values in this interval not displayed.    Liver Function Tests: Recent Labs  Lab 11/03/2018 0753 11/03/18 0324 11/04/18 0500 11/04/18 1630 11/05/18 0416  AST 31 31  --   --   --  ALT 15 13  --   --   --   ALKPHOS 52 47  --   --   --   BILITOT 1.0 0.7  --   --   --   PROT 7.4 6.7  --   --   --   ALBUMIN 2.8* 2.4* 2.3* 2.5* 2.4*   Recent Labs  Lab 11/09/2018 1436  LIPASE 25  AMYLASE 132*   Recent Labs  Lab 10/28/2018 0930  AMMONIA 72*    CBC: Recent Labs  Lab 11/22/2018 0803  11/04/2018 1435 11/03/18 0324 11/03/18 0510 11/04/18 0524 11/04/18 1325 11/05/18 0416  WBC 6.8  --  5.1 7.9  --  3.8* 6.4 11.0*  NEUTROABS 3.4  --   --   --   --  3.4  --   --   HGB 9.6*   < > 10.1* 9.9* 11.2* 10.9* 9.6* 9.8*  HCT 31.6*   < > 30.9* 31.1* 33.0* 33.6* 30.5* 30.8*  MCV 103.6*  --  95.7 96.6  --  96.6 97.4 95.7  PLT 58*  --  63* 89*  --  94* 75* 91*   < > = values in this interval not displayed.    Cardiac Enzymes: Recent Labs  Lab  11/15/2018 0753  CKTOTAL 116    BNP: Invalid input(s): POCBNP  CBG: Recent Labs  Lab 11/05/18 0039 11/05/18 0234 11/05/18 0423 11/05/18 0742 11/05/18 0743  GLUCAP 117* 75 84 36* 42*    Microbiology: Results for orders placed or performed during the hospital encounter of 11/19/2018  SARS Coronavirus 2 by RT PCR (hospital order, performed in [facility] hospital lab) Nasopharyngeal Nasopharyngeal Swab     Status: None   Collection Time: 11/03/2018  7:32 AM   Specimen: Nasopharyngeal Swab  Result Value Ref Range Status   SARS Coronavirus 2 NEGATIVE NEGATIVE Final    Comment: (NOTE) If result is NEGATIVE SARS-CoV-2 target nucleic acids are NOT DETECTED. The SARS-CoV-2 RNA is generally detectable in upper and lower  respiratory specimens during the acute phase of infection. The lowest  concentration of SARS-CoV-2 viral copies this assay can detect is 250  copies / mL. A negative result does not preclude SARS-CoV-2 infection  and should not be used as the sole basis for treatment or other  patient management decisions.  A negative result may occur with  improper specimen collection / handling, submission of specimen other  than nasopharyngeal swab, presence of viral mutation(s) within the  areas targeted by this assay, and inadequate number of viral copies  (<250 copies / mL). A negative result must be combined with clinical  observations, patient history, and epidemiological information. If result is POSITIVE SARS-CoV-2 target nucleic acids are DETECTED. The SARS-CoV-2 RNA is generally detectable in upper and lower  respiratory specimens dur ing the acute phase of infection.  Positive  results are indicative of active infection with SARS-CoV-2.  Clinical  correlation with patient history and other diagnostic information is  necessary to determine patient infection status.  Positive results do  not rule out bacterial infection or co-infection with other viruses. If result is  PRESUMPTIVE POSTIVE SARS-CoV-2 nucleic acids MAY BE PRESENT.   A presumptive positive result was obtained on the submitted specimen  and confirmed on repeat testing.  While 2019 novel coronavirus  (SARS-CoV-2) nucleic acids may be present in the submitted sample  additional confirmatory testing may be necessary for epidemiological  and / or clinical management purposes  to differentiate between  SARS-CoV-2  and other Sarbecovirus currently known to infect humans.  If clinically indicated additional testing with an alternate test  methodology [phone]) is advised. The SARS-CoV-2 RNA is generally  detectable in upper and lower respiratory sp ecimens during the acute  phase of infection. The expected result is Negative. Fact Sheet for Patients:  StrictlyIdeas.no Fact Sheet for Healthcare Providers: BankingDealers.co.za This test is not yet approved or cleared by the [place] FDA and has been authorized for detection and/or diagnosis of SARS-CoV-2 by FDA under an Emergency Use Authorization (EUA).  This EUA will remain in effect (meaning this test can be used) for the duration of the COVID-19 declaration under Section 564(b)(1) of the Act, 21 U.S.C. section 360bbb-3(b)(1), unless the authorization is terminated or revoked sooner. Performed at [facility], [place] [address], [place], [place] [address]   Culture, blood (routine x 2)     Status: None (Preliminary result)   Collection Time: 11/21/2018  9:30 AM   Specimen: BLOOD LEFT WRIST  Result Value Ref Range Status   Specimen Description BLOOD LEFT WRIST  Final   Special Requests   Final    BOTTLES DRAWN AEROBIC AND ANAEROBIC Blood Culture adequate volume   Culture   Final    NO GROWTH 2 DAYS Performed at [facility], [place] [address] [place], [place] [address]    Report Status PENDING  Incomplete  Urine culture     Status: None   Collection Time: 11/20/2018 10:27 AM    Specimen: Urine, Random  Result Value Ref Range Status   Specimen Description URINE, RANDOM  Final   Special Requests NONE  Final   Culture   Final    NO GROWTH Performed at [facility], [place] [address], [place], [place] [address]    Report Status 11/04/2018 FINAL  Final  Culture, respiratory (tracheal aspirate)     Status: None (Preliminary result)   Collection Time: 11/03/2018 10:27 AM   Specimen: Tracheal Aspirate; Respiratory  Result Value Ref Range Status   Specimen Description TRACHEAL ASPIRATE  Final   Special Requests NONE  Final   Gram Stain   Final    ABUNDANT WBC PRESENT, PREDOMINANTLY PMN MODERATE GRAM POSITIVE RODS MODERATE GRAM NEGATIVE RODS FEW GRAM POSITIVE COCCI IN PAIRS    Culture   Final    MODERATE SERRATIA MARCESCENS CULTURE REINCUBATED FOR BETTER GROWTH Performed at [facility], [place] [address], [place], [place] [address]    Report Status PENDING  Incomplete  Culture, blood (routine x 2)     Status: None (Preliminary result)   Collection Time: 11/20/2018 12:18 PM   Specimen: BLOOD  Result Value Ref Range Status   Specimen Description BLOOD SITE NOT SPECIFIED  Final   Special Requests   Final    BOTTLES DRAWN AEROBIC AND ANAEROBIC Blood Culture adequate volume   Culture   Final    NO GROWTH 2 DAYS Performed at [facility], [address] [place],  [address]    Report Status PENDING  Incomplete  Culture, blood (routine x 2)     Status: None (Preliminary result)   Collection Time: 11/08/2018 12:54 PM   Specimen: BLOOD  Result Value Ref Range Status   Specimen Description BLOOD A-LINE  Final   Special Requests   Final    BOTTLES DRAWN AEROBIC AND ANAEROBIC Blood Culture adequate volume   Culture   Final    NO GROWTH 2 DAYS Performed at [facility], [place] [address], [place], [place] [address]    Report Status PENDING  Incomplete  MRSA PCR Screening     Status: None   Collection Time: 11/03/18  6:11 PM   Specimen: Nasal  Mucosa; Nasopharyngeal  Result Value Ref Range Status   MRSA by PCR NEGATIVE NEGATIVE Final    Comment:        The GeneXpert MRSA Assay (FDA approved for NASAL specimens only), is one component of a comprehensive MRSA colonization surveillance program. It is not intended to diagnose MRSA infection nor to guide or monitor treatment for MRSA infections. Performed at [facility], [place] [address] [place], [place] [address]     Coagulation Studies: Recent Labs    11/12/2018 6812 11/01/2018 1435 11/03/18 0841 11/04/18 0524 11/04/18 1346  LABPROT 20.8* 21.7* 21.2* 20.7* 21.9*  INR 1.8* 1.9* 1.9* 1.8* 1.9*    Urinalysis: No results for input(s): COLORURINE, LABSPEC, PHURINE, GLUCOSEU, HGBUR, BILIRUBINUR, KETONESUR, PROTEINUR, UROBILINOGEN, NITRITE, LEUKOCYTESUR in the last 72 hours.  Invalid input(s): APPERANCEUR    Imaging: Dg Chest Port 1 View  Result Date: 11/04/2018 CLINICAL DATA:  Central line placement EXAM: PORTABLE CHEST 1 VIEW COMPARISON:  Earlier today FINDINGS: New right IJ line with tip at the SVC. No pneumothorax or new mediastinal widening. Endotracheal tube tip just below the clavicular heads. The orogastric tube at least reaches the stomach. Single chamber pacer/ICD into the right ventricle. Stable low volume chest with hazy opacity from atelectasis and pleural fluid based on recent CT. IMPRESSION: 1. New right IJ line without complicating feature. 2. Aeration and other hardware is stable from study earlier today. Electronically Signed   By: Monte Fantasia M.D.   On: 11/04/2018 11:26   Dg Chest Port 1 View  Result Date: 11/04/2018 CLINICAL DATA:  Endotracheal tube. EXAM: PORTABLE CHEST 1 VIEW COMPARISON:  November 02, 2018. FINDINGS: Stable cardiomegaly. Endotracheal tube is unchanged in position. Nasogastric tube is seen entering the stomach. Left-sided pacemaker is unchanged in position. No pneumothorax is noted. Mild bibasilar subsegmental atelectasis is  noted. No pleural effusion is noted. Bony thorax is unremarkable. IMPRESSION: Endotracheal and nasogastric tubes are in grossly good position. Mild bibasilar subsegmental atelectasis is noted. Electronically Signed   By: Marijo Conception M.D.   On: 11/04/2018 07:21     Medications:   .  prismasol BGK 4/2.5 500 mL/hr at 11/04/18 2355  .  prismasol BGK 4/2.5 500 mL/hr at 11/05/18 0014  . sodium chloride    . sodium chloride 10 mL/hr at 11/04/18 1300  . sodium chloride    . ampicillin-sulbactam (UNASYN) IV Stopped (11/05/18 0450)  . fentaNYL infusion INTRAVENOUS Stopped (11/05/18 0655)  . norepinephrine (LEVOPHED) Adult infusion 6 mcg/min (11/05/18 0655)  . prismasol BGK 4/2.5 2,000 mL/hr at 11/05/18 0500  . propofol (DIPRIVAN) infusion Stopped (11/04/18 0815)   . dextrose      . amiodarone  400 mg Per Tube Daily  . chlorhexidine gluconate (MEDLINE KIT)  15 mL Mouth Rinse BID  . Chlorhexidine Gluconate Cloth  6 each Topical Daily  . feeding supplement (VITAL HIGH PROTEIN)  1,000 mL Per Tube Q24H  . influenza vac split quadrivalent PF  0.5 mL Intramuscular Tomorrow-1000  . ipratropium-albuterol  3 mL Nebulization QID  . lactulose  10 g Per Tube TID  . mouth rinse  15 mL Mouth Rinse 10 times per day  . pantoprazole (PROTONIX) IV  40 mg Intravenous Q12H   Place/Maintain arterial line **AND** sodium  chloride, fentaNYL, heparin, labetalol, sodium chloride  Assessment/ Plan:   ESRD -usually Tuesday Thursday Saturday dialysis.  Initiated with CRRT 11/04/2018 appears to be stable.  No changes to prescription at this time.  Acute hypoxic respiratory failure following cardiac arrest patient intubated  Status post cardiac arrest due to excessive sedation reduced LV function appreciate assistance from Dr. Einar Gip.  Diffuse hypokinesia on 2D echo no regional wall abnormality  Shock continues on pressors IV Levophed.  Continues on Unasyn 3 g every 12 hours  Acute encephalopathy.  Receiving  lactulose.  Hypoxic brain injury.  Negative CT scan of head 11/14/2018  Cirrhosis due to hepatitis C has required paracenteses in the past  Volume removing about 50 -100 cc an hour on CRRT  Hyponatremia improved  ICD [employer]'s in situ interrogated 11/04/2018 no significant arrhythmias appreciate assistance  Lower GI bleed appreciate assistance from gastroenterology Dr Havery Moros.  Following recommendations.   LOS: [place] '@TODAY''@8'$ :02 AM

## 2018-11-05 NOTE — Procedures (Signed)
Central Venous Catheter Insertion Procedure Note Terril Chestnut [id number] [birth year]  Procedure: Insertion of Central Venous Catheter Indications: Drug and/or fluid administration  Procedure Details Consent: Risks of procedure as well as the alternatives and risks of each were explained to the (patient/caregiver).  Consent for procedure obtained. Time Out: Verified patient identification, verified procedure, site/side was marked, verified correct patient position, special equipment/implants available, medications/allergies/relevent history reviewed, required imaging and test results available.  Performed  Maximum sterile technique was used including antiseptics, cap, gloves, gown, hand hygiene, mask and sheet. Skin prep: Chlorhexidine; local anesthetic administered A antimicrobial bonded/coated triple lumen catheter was placed in the left internal jugular vein using the Seldinger technique.  Evaluation Blood flow good Complications: No apparent complications Patient did tolerate procedure well. Chest X-ray ordered to verify placement.  CXR: pending.  Candee Furbish 11/05/2018, 8:54 AM

## 2018-11-05 NOTE — Progress Notes (Signed)
Attempted to wean patient with goal of extubation.  Started with PS of 10, PEEP of 5.  Pt respiratory rate climbed to ~ 50, with low tidal volumes in the 115 to 180 mL range.  Pt returned to full support and MD notified by RN.

## 2018-11-05 NOTE — Progress Notes (Addendum)
Daily Rounding Note  11/05/2018, 8:21 AM  LOS: 3 days   SUBJECTIVE:   Chief complaint:   Hematochezia. Cirrhosis.  P[birth year] today.    RN reports had red blood from mouth yesterday, after hematochezia.  It did not persist.  Never has had blood via NGT.  Stools have become brown, no further hematochezia. Remains hypotensive.   CCM placing central line.    OBJECTIVE:         Vital signs in last 24 hours:    Temp:  [97.7 F (36.5 C)-99.7 F (37.6 C)] 98.6 F (37 C) (10/13 0402) Pulse Rate:  [74-84] 84 (10/13 0740) Resp:  [16-28] 28 (10/13 0740) BP: (59-122)/(48-83) 98/63 (10/13 0740) SpO2:  [95 %-100 %] 99 % (10/13 0740) FiO2 (%):  [40 %] 40 % (10/13 0740) Weight:  [84.4 kg] 84.4 kg (10/13 0500) Last BM Date: 11/04/18 Filed Weights   11/03/18 0500 11/04/18 0418 11/05/18 0500  Weight: 86 kg 87.8 kg 84.4 kg   Was not able to examine pt, sterile procedure in progress.    Intake/Output from previous day: 10/12 0701 - 10/13 0700 In: 1840.2 [I.V.:1015.2; Blood:200; NG/GT:325; IV Piggyback:300] Out: 2008    Lab Results: Recent Labs    11/04/18 0524 11/04/18 1325 11/05/18 0416  WBC 3.8* 6.4 11.0*  HGB 10.9* 9.6* 9.8*  HCT 33.6* 30.5* 30.8*  PLT 94* 75* 91*   BMET Recent Labs    11/04/18 0500 11/04/18 1630 11/05/18 0416  NA 131* 131* 133*  K 3.9 4.2 4.3  CL 92* 93* 96*  CO2 21* 21* 22  GLUCOSE 149* 89 79  BUN 54* 48* 31*  CREATININE 9.00* 7.74* 5.18*  CALCIUM 8.3* 8.2* 8.4*   LFT Recent Labs    11/03/18 0324 11/04/18 0500 11/04/18 1630 11/05/18 0416  PROT 6.7  --   --   --   ALBUMIN 2.4* 2.3* 2.5* 2.4*  AST 31  --   --   --   ALT 13  --   --   --   ALKPHOS 47  --   --   --   BILITOT 0.7  --   --   --    PT/INR Recent Labs    11/04/18 0524 11/04/18 1346  LABPROT 20.7* 21.9*  INR 1.8* 1.9*     ASSESMENT:   *   Hematochezia, resolved.  Red blood per mouth.  Bilious material per  NGT.   Known diverticulosis, ? Diverticular bleed.  ? Ischemic colitis.    *    Cirrhosis liver.  No  Varices per non con CT.    w coagulopathy.  Received FFP, Vit K.  INR stable, not corrected.   Elevated ammonia.  Receiving Lactulose via tube.   Thrombocytopenia.  Improved/stable.   Hepatitis C untreated.   Ascites.  Previous paracentesis, no SBP hx.   *   Normocytic anemia.  Hgb stable.  No  PRBCs thus far.    *   Anoxic encephalopathy?  *   Cardiac, resp arrest.  CHF   *    ESRD.  On HD.     PLAN   *   Supportive care.  No plans for endoscopic, colonoscopic studies.  Consider EGD in future if he makes a signif recovery.       Eric [name]  11/05/2018, 8:21 AM Phone [phone]      [place] Attending   I have taken an interval  history, reviewed the chart and examined the patient. I agree with the Advanced Practitioner's note, impression and recommendations.     I did see patient - he is gazing around the room and not tracking.  Failed a wean trial today.  Ascites shows > 2000 WBC - in setting of cardiac arrest/sudden death I am not sure what this is but makes sense to treat w/ Abx   He remains critically ill with concern for unlikely full recovery especially mental status.   We are available if the situation would change to where our input re: possible endoscopic exam (no role at present) needed or other management ? But CCM has the other issues well in hand.  Please call back if we are needed to see him again  Gatha Mayer, MD, [facility] 11/05/2018 6:52 PM Pager [phone]

## 2018-11-05 NOTE — Procedures (Signed)
Paracentesis Procedure Note  Indications: Ascites, cardiac arrest  Procedure Details  Consent: Informed consent was obtained. Risks of the procedure were discussed including: infection, bleeding, pain, bowel perforation.  Maximum sterile technique was used including antiseptics, cap, gloves, gown, hand hygiene, mask and sheet. Skin prep: Chlorhexidine; local anesthetic administered. The abdominal wall was punctured in the right lower quadrant using ultrasound guidance. Fluid was obtained without any difficulties and minimal blood loss.  A dressing was applied to the wound and wound care instructions were provided.   Findings 2700 ml of cloudy ascitic fluid was obtained. A sample was sent to Pathology for cytology and cell counts, as well as for infection analysis.  Complications:  None; patient tolerated the procedure well.        Condition: stable

## 2018-11-05 NOTE — Progress Notes (Addendum)
Tried weaning with stopping all sedation and giving good hour long PS trial.  Became less responsive and ABG showing retention.  Will have to hold off on extubation trial today.  Ascites c/w SBP, unasyn should cover most bugs.  Erskine Emery MD

## 2018-11-05 NOTE — Progress Notes (Signed)
Nutrition Follow-up  RD working remotely.  DOCUMENTATION CODES:   Not applicable  INTERVENTION:   Initiate trickle tube feeds via OGT: - Vital 1.5 @ 20 ml/hr (480 ml/day)  Trickle tube feeding regimen provides 720 kcal, 32 grams of protein, and 367 ml of H2O.    Goal tube feeding regimen: - Vital 1.5 @ 40 ml/hr (960 ml/day) - Pro-stat 60 ml BID  Goal tube feeding regimen provides 1840 kcal, 125 grams of protein, and 733 ml of H2O.   - Recommend B complex with vitamin C  NUTRITION DIAGNOSIS:   Increased nutrient needs related to chronic illness (ESRD/HD) as evidenced by estimated needs.  Ongoing  GOAL:   Patient will meet greater than or equal to 90% of their needs  Progressing  MONITOR:   Vent status, Labs, Weight trends, TF tolerance, Skin, I & O's  REASON FOR ASSESSMENT:   Ventilator    ASSESSMENT:   Patient with PMH significant for hepatitis C, CHF, CAD s/p ICD, cardiac arrest, cirrhosis with recurrent ascites, ESRD on HD, HTN, and DM. Presents this admission with cardiac arrest.  10/10 - trickle TF initiated 10/12 - CRRT initiated, TF held 10/13 - s/p paracentesis with 2700 ml of fluid removed  Noted plan to restart tube feeds today at trickle rate. RD will adjust order.  Pt remains on CRRT.  OG tube remains in place.  Weight down 3 lbs overall. Per RN edema assessment, pt with moderate pitting generalized edema and mild pitting edema to BUE.  EDW: 79.5 kg  Patient is currently intubated on ventilator support MV: 12.6 L/min Temp (24hrs), Avg:98.6 F (37 C), Min:97.7 F (36.5 C), Max:99.7 F (37.6 C) BP (a-line): 128/50 MAP (a-line): 68  Drips: D10: 20 ml/hr Levophed: 22.5 ml/hr  Medications reviewed and include: Solu-cortef, lactulose, Protonix, IV abx, IV albumin 25 grams q 6 hours  Labs reviewed: sodium 133, chloride 96, hemoglobin 9.8 CBG's: 36-118 x 24 hours  CRRT net UF: 2008 ml x 24 hours I/O's: +1.6 L since admit  Diet  Order:   Diet Order            Diet NPO time specified  Diet effective now              EDUCATION NEEDS:   Not appropriate for education at this time  Skin:  Skin Assessment: Skin Integrity Issues: Skin Integrity Issues: Diabetic Ulcer: left leg  Last BM:  11/04/18  Height:   Ht Readings from Last 1 Encounters:  11/06/2018 5\' 6"  (1.676 m)    Weight:   Wt Readings from Last 1 Encounters:  11/05/18 84.4 kg    Ideal Body Weight:  64.5 kg  BMI:  Body mass index is 30.03 kg/m.  Estimated Nutritional Needs:   Kcal:  1823  Protein:  125-145 grams  Fluid:  1000 ml + UOP    Gaynell Face, MS, RD, LDN Inpatient Clinical Dietitian Pager: [phone] Weekend/After Hours: [phone]

## 2018-11-05 NOTE — Progress Notes (Signed)
Hypoglycemic Event   Treatment: D50 10ml Symptoms: none CBG:65 Follow-up CBG:122   MD Tamala Julian aware. Increased D10 rate to 79ml/hr

## 2018-11-06 ENCOUNTER — Inpatient Hospital Stay (HOSPITAL_COMMUNITY): Payer: Medicare Other

## 2018-11-06 ENCOUNTER — Other Ambulatory Visit: Payer: Medicare Other

## 2018-11-06 ENCOUNTER — Ambulatory Visit: Payer: Medicare Other

## 2018-11-06 LAB — RENAL FUNCTION PANEL
Albumin: 2.7 g/dL — ABNORMAL LOW (ref 3.5–5.0)
Albumin: 3.2 g/dL — ABNORMAL LOW (ref 3.5–5.0)
Anion gap: 13 (ref 5–15)
Anion gap: 15 (ref 5–15)
BUN: 17 mg/dL (ref 8–23)
BUN: 14 mg/dL (ref 8–23)
CO2: 21 mmol/L — ABNORMAL LOW (ref 22–32)
CO2: 22 mmol/L (ref 22–32)
Calcium: 8.5 mg/dL — ABNORMAL LOW (ref 8.9–10.3)
Calcium: 8.7 mg/dL — ABNORMAL LOW (ref 8.9–10.3)
Chloride: 100 mmol/L (ref 98–111)
Chloride: 100 mmol/L (ref 98–111)
Creatinine, Ser: 2.4 mg/dL — ABNORMAL HIGH (ref 0.61–1.24)
Creatinine, Ser: 2.06 mg/dL — ABNORMAL HIGH (ref 0.61–1.24)
GFR calc Af Amer: 32 mL/min — ABNORMAL LOW (ref >60)
GFR calc Af Amer: 38 mL/min — ABNORMAL LOW (ref >60)
GFR calc non Af Amer: 27 mL/min — ABNORMAL LOW (ref >60)
GFR calc non Af Amer: 33 mL/min — ABNORMAL LOW (ref >60)
Glucose, Bld: 230 mg/dL — ABNORMAL HIGH (ref 70–99)
Glucose, Bld: 175 mg/dL — ABNORMAL HIGH (ref 70–99)
Phosphorus: 2.9 mg/dL (ref 2.5–4.6)
Phosphorus: 2.9 mg/dL (ref 2.5–4.6)
Potassium: 4.4 mmol/L (ref 3.5–5.1)
Potassium: 4.5 mmol/L (ref 3.5–5.1)
Sodium: 134 mmol/L — ABNORMAL LOW (ref 135–145)
Sodium: 137 mmol/L (ref 135–145)

## 2018-11-06 LAB — POCT I-STAT 7, (LYTES, BLD GAS, ICA,H+H)
Acid-base deficit: 2 mmol/L (ref 0.0–2.0)
Bicarbonate: 25.5 mmol/L (ref 20.0–28.0)
Calcium, Ion: 1.16 mmol/L (ref 1.15–1.40)
HCT: 32 % — ABNORMAL LOW (ref 39.0–52.0)
Hemoglobin: 10.9 g/dL — ABNORMAL LOW (ref 13.0–17.0)
O2 Saturation: 98 %
Patient temperature: 93.3
Potassium: 5.3 mmol/L — ABNORMAL HIGH (ref 3.5–5.1)
Sodium: 137 mmol/L (ref 135–145)
TCO2: 27 mmol/L (ref 22–32)
pCO2 arterial: 51 mmHg — ABNORMAL HIGH (ref 32.0–48.0)
pH, Arterial: 7.291 — ABNORMAL LOW (ref 7.350–7.450)
pO2, Arterial: 108 mmHg (ref 83.0–108.0)

## 2018-11-06 LAB — GLUCOSE, CAPILLARY
Glucose-Capillary: 162 mg/dL — ABNORMAL HIGH (ref 70–99)
Glucose-Capillary: 170 mg/dL — ABNORMAL HIGH (ref 70–99)
Glucose-Capillary: 176 mg/dL — ABNORMAL HIGH (ref 70–99)
Glucose-Capillary: 183 mg/dL — ABNORMAL HIGH (ref 70–99)
Glucose-Capillary: 186 mg/dL — ABNORMAL HIGH (ref 70–99)
Glucose-Capillary: 189 mg/dL — ABNORMAL HIGH (ref 70–99)

## 2018-11-06 LAB — CULTURE, RESPIRATORY W GRAM STAIN

## 2018-11-06 LAB — MAGNESIUM: Magnesium: 2.5 mg/dL — ABNORMAL HIGH (ref 1.7–2.4)

## 2018-11-06 LAB — CYTOLOGY - NON PAP

## 2018-11-06 MED ORDER — ORAL CARE MOUTH RINSE
15.0000 mL | Freq: Two times a day (BID) | OROMUCOSAL | Status: DC
  Administered 2018-11-06 – 2018-11-07 (×3): 15 mL via OROMUCOSAL

## 2018-11-06 MED ORDER — PRO-STAT SUGAR FREE PO LIQD
60.0000 mL | Freq: Two times a day (BID) | ORAL | Status: DC
  Administered 2018-11-06: 60 mL
  Filled 2018-11-06: qty 60

## 2018-11-06 MED ORDER — PRO-STAT SUGAR FREE PO LIQD
60.0000 mL | Freq: Two times a day (BID) | ORAL | Status: DC
  Administered 2018-11-06: 60 mL via ORAL
  Filled 2018-11-06: qty 60

## 2018-11-06 MED ORDER — MIDODRINE HCL 5 MG PO TABS
10.0000 mg | ORAL_TABLET | Freq: Three times a day (TID) | ORAL | Status: DC
  Administered 2018-11-07 (×2): 10 mg via ORAL
  Filled 2018-11-06 (×2): qty 2

## 2018-11-06 MED ORDER — ALBUMIN HUMAN 25 % IV SOLN
25.0000 g | Freq: Once | INTRAVENOUS | Status: AC
  Administered 2018-11-06: 25 g via INTRAVENOUS
  Filled 2018-11-06: qty 50

## 2018-11-06 MED ORDER — VITAL 1.5 CAL PO LIQD
1000.0000 mL | ORAL | Status: DC
  Administered 2018-11-06: 1000 mL
  Filled 2018-11-06 (×3): qty 1000

## 2018-11-06 NOTE — Progress Notes (Signed)
PT Cancellation Note  Patient Details Name: Eric [name] MRN: [id number] DOB: 1955-08-07   Cancelled Treatment:    Reason Eval/Treat Not Completed: Patient not medically ready; just extubated this am and still on CRRT.  Will attempt again another day.    Reginia Naas 11/06/2018, 3:30 PM  Magda Kiel, [place] [phone] 11/06/2018

## 2018-11-06 NOTE — Progress Notes (Signed)
Nutrition Follow-up  DOCUMENTATION CODES:   Non-severe (moderate) malnutrition in context of chronic illness  INTERVENTION:   If unable to advance diet, recommend placement of NG tube and initiation of enteral nutrition. Recommend: - Vital 1.5 @ 50 ml/hr (1200 ml/day) - Pro-stat 60 ml BID  Recommended tube feeding regimen would provide 2200 kcal, 141 grams of protein, and 917 ml of H2O.  - RD will monitor for diet advancement and supplement as appropriate  - Continue B-complex with vitamin C  NUTRITION DIAGNOSIS:   Moderate Malnutrition related to chronic illness (ESRD on HD, CHF, cirrhosis) as evidenced by moderate fat depletion, moderate muscle depletion, severe muscle depletion.  New diagnosis after completion of NFPE  GOAL:   Patient will meet greater than or equal to 90% of their needs  Unmet at this time  MONITOR:   Diet advancement, Weight trends, Skin, I & O's  REASON FOR ASSESSMENT:   Ventilator    ASSESSMENT:   Patient with PMH significant for hepatitis C, CHF, CAD s/p ICD, cardiac arrest, cirrhosis with recurrent ascites, ESRD on HD, HTN, and DM. Presents this admission with cardiac arrest.  10/10 - trickle TF initiated 10/12 - CRRT initiated, TF held 10/13 - s/p paracentesis with 2700 ml of fluid removed 10/14 - extubated  Discussed pt with RN and during ICU rounds.  Pt extubated this AM. Pt remains on CRRT. Pt NPO awaiting SLP evaluation. Per RN, mental status may be an issue to diet advancement.  If unable to advance diet, strongly recommend placement of NG tube and intiation of enteral nutrition given increased nutrient needs related to CRRT as well as malnutrition.  Weight down a total of 17 lbs since admit. Will continue to monitor trends.  Spoke with pt's family member who he lives with at bedside. [name] family member reports pt had a good appetite and ate well PTA. She states his weight was up and down due to ascites. She said she has noticed  him losing muscle mass.  Discussed nutrition support options with RN.  Medications reviewed and include: B-complex with vitamin C, Solu-cortef, lactulose, Protonix, IV abx, Levophed D10: 20 ml/hr  Labs reviewed: sodium 134, magnesium 2.5, hemoglobin 9.9 CBG's: 122-189  CRRT net UF: 4322 ml x 24 hours I/O's: -509 ml since admit  NUTRITION - FOCUSED PHYSICAL EXAM:    Most Recent Value  Orbital Region  Severe depletion  Upper Arm Region  Moderate depletion  Thoracic and Lumbar Region  Moderate depletion  Buccal Region  Moderate depletion  Temple Region  Moderate depletion  Clavicle Bone Region  Severe depletion  Clavicle and Acromion Bone Region  Severe depletion  Scapular Bone Region  Moderate depletion  Dorsal Hand  Mild depletion  Patellar Region  Mild depletion  Anterior Thigh Region  Mild depletion  Posterior Calf Region  Mild depletion  Edema (RD Assessment)  Mild [BLE]  Hair  Reviewed  Eyes  Reviewed  Mouth  Reviewed  Skin  Reviewed  Nails  Reviewed       Diet Order:   Diet Order            Diet NPO time specified  Diet effective now              EDUCATION NEEDS:   Not appropriate for education at this time  Skin:  Skin Assessment: Skin Integrity Issues: Diabetic Ulcer: left leg Incisions: abdomen  Last BM:  11/04/18  Height:   Ht Readings from Last 1 Encounters:  10/29/2018  5\' 6"  (1.676 m)    Weight:   Wt Readings from Last 1 Encounters:  11/06/18 78.1 kg    Ideal Body Weight:  64.5 kg  BMI:  Body mass index is 27.79 kg/m.  Estimated Nutritional Needs:   Kcal:  2100-2300  Protein:  125-145 grams  Fluid:  1000 ml + UOP    Gaynell Face, MS, RD, LDN Inpatient Clinical Dietitian Pager: [phone] Weekend/After Hours: [phone]

## 2018-11-06 NOTE — Progress Notes (Addendum)
[facility] ROUNDING NOTE   Subjective:   This is a 63 year old history of coronary artery disease status post ICD with history of cardiac arrest.  Cirrhosis and history of hepatitis C with ascites requiring monthly paracentesis, end-stage renal disease, hypertension diabetes mellitus type 2.  On 10/04/202021 he was brought to the emergency room with chest pain and whilst in the emergency room sustained a acute cardiac arrest with respiratory insufficiency requiring brief period of chest compressions.   He continues to be intubated at this time and is currently receiving CRRT  Blood pressure 120/70 pulse 60 temperature 92.1 O2 sats 99% FiO2 30%  Sodium 134 potassium 4.4 chloride 100 CO2 21 BUN 17 creatinine 2.4 glucose 230 calcium 8.5 phosphorus 2.9 magnesium 2.5 albumin 2.7 hemoglobin 9.9  2D echo EF 40 to 45% mildly reduced LV function.  CT scan chest no pulmonary embolus aortic dissection right upper lobe infiltrate with bronchograms.  Amiodarone 400 mg daily, duo nebs 4 times daily, lactulose 10 g 3 times daily, Protonix 40 mg daily, hydrocortisone 50 mg every 6 hours  IV Maxipime 2 g every 12 hours IV Levophed IV vasopressin    Objective:  Vital signs in last 24 hours:  Temp:  [90.1 F (32.3 C)-99 F (37.2 C)] 91.8 F (33.2 C) (10/14 0600) Pulse Rate:  [59-117] 59 (10/14 0600) Resp:  [16-29] 27 (10/14 0600) BP: (98-145)/(47-63) 145/59 (10/13 1531) SpO2:  [98 %-100 %] 100 % (10/14 0600) FiO2 (%):  [30 %-40 %] 30 % (10/14 0400) Weight:  [78.1 kg] 78.1 kg (10/14 0400)  Weight change: -6.3 kg Filed Weights   11/04/18 0418 11/05/18 0500 11/06/18 0400  Weight: 87.8 kg 84.4 kg 78.1 kg    Intake/Output: I/O last 3 completed shifts: In: 2832.5 [I.V.:1594; Blood:200; NG/GT:545; IV Piggyback:493.5] Out: 6295 [Other:3452]   Intake/Output this shift:  Total I/O In: 1431 [I.V.:911; NG/GT:220; IV Piggyback:300] Out: 2656 [Other:2656]  General: Chronically  ill-appearing man laying in bed intubated, sedated  HEENT: ET tube and OG tube in place  Cardiovascular: Regular rate and rhythm, pacing.  No murmurs.   Lungs: Improved rhonchi, less secretions from ET tube. Abdomen: Distended, soft, nontender Musculoskeletal: Left lower extremity pitting edema, mildly improved since yesterday.  No clubbing.  Right upper arm fistula with thrill. Skin: Chronic discoloration bilateral lower extremities.   Basic Metabolic Panel: Recent Labs  Lab 11/23/2018 0753  11/03/18 0324  11/04/18 1630 11/05/18 0416 11/05/18 0909 11/05/18 1518 11/05/18 1705 11/06/18 0338  NA 131*   < > 131*   < > 131* 133* 134* 138 134* 134*  K 4.1   < > 3.8   < > 4.2 4.3 4.1 4.0 4.3 4.4  CL 92*   < > 92*   < > 93* 96* 98  --  99 100  CO2 23   < > 21*   < > 21* 22 22  --  24 21*  GLUCOSE 128*   < > 152*   < > 89 79 87  --  179* 230*  BUN 37*   < > 45*   < > 48* 31* 30*  --  23 17  CREATININE 7.24*   < > 8.23*   < > 7.74* 5.18* 4.89*  --  3.76* 2.40*  CALCIUM 8.7*   < > 8.7*   < > 8.2* 8.4* 8.1*  --  8.1* 8.5*  MG 2.2  --  2.3  --   --  2.3  --   --   --  2.5*  PHOS 5.0*  --  5.1*   < > 5.1* 4.0 4.2  --  3.8 2.9   < > = values in this interval not displayed.    Liver Function Tests: Recent Labs  Lab 10/28/2018 0753 11/03/18 0324  11/04/18 1630 11/05/18 0416 11/05/18 0909 11/05/18 1705 11/06/18 0338  AST 31 31  --   --   --   --   --   --   ALT 15 13  --   --   --   --   --   --   ALKPHOS 52 47  --   --   --   --   --   --   BILITOT 1.0 0.7  --   --   --   --   --   --   PROT 7.4 6.7  --   --   --   --   --   --   ALBUMIN 2.8* 2.4*   < > 2.5* 2.4* 2.3* 2.2* 2.7*   < > = values in this interval not displayed.   Recent Labs  Lab 11/04/2018 1436  LIPASE 25  AMYLASE 132*   Recent Labs  Lab 11/23/2018 0930  AMMONIA 72*    CBC: Recent Labs  Lab 11/11/2018 0803  11/11/2018 1435 11/03/18 0324 11/03/18 0510 11/04/18 0524 11/04/18 1325 11/05/18 0416 11/05/18 1518   WBC 6.8  --  5.1 7.9  --  3.8* 6.4 11.0*  --   NEUTROABS 3.4  --   --   --   --  3.4  --   --   --   HGB 9.6*   < > 10.1* 9.9* 11.2* 10.9* 9.6* 9.8* 9.9*  HCT 31.6*   < > 30.9* 31.1* 33.0* 33.6* 30.5* 30.8* 29.0*  MCV 103.6*  --  95.7 96.6  --  96.6 97.4 95.7  --   PLT 58*  --  63* 89*  --  94* 75* 91*  --    < > = values in this interval not displayed.    Cardiac Enzymes: Recent Labs  Lab 10/29/2018 0753  CKTOTAL 116    BNP: Invalid input(s): POCBNP  CBG: Recent Labs  Lab 11/05/18 1319 11/05/18 1500 11/05/18 2008 11/06/18 0001 11/06/18 0432  GLUCAP 122* 133* 144* 170* 186*    Microbiology: Results for orders placed or performed during the hospital encounter of 11/13/2018  SARS Coronavirus 2 by RT PCR (hospital order, performed in [place] hospital lab) Nasopharyngeal Nasopharyngeal Swab     Status: None   Collection Time: 10/24/2018  7:32 AM   Specimen: Nasopharyngeal Swab  Result Value Ref Range Status   SARS Coronavirus 2 NEGATIVE NEGATIVE Final    Comment: (NOTE) If result is NEGATIVE SARS-CoV-2 target nucleic acids are NOT DETECTED. The SARS-CoV-2 RNA is generally detectable in upper and lower  respiratory specimens during the acute phase of infection. The lowest  concentration of SARS-CoV-2 viral copies this assay can detect is 250  copies / mL. A negative result does not preclude SARS-CoV-2 infection  and should not be used as the sole basis for treatment or other  patient management decisions.  A negative result may occur with  improper specimen collection / handling, submission of specimen other  than nasopharyngeal swab, presence of viral mutation(s) within the  areas targeted by this assay, and inadequate number of viral copies  (<250 copies / mL). A negative result must be combined with clinical  observations,  patient history, and epidemiological information. If result is POSITIVE SARS-CoV-2 target nucleic acids are DETECTED. The SARS-CoV-2 RNA is  generally detectable in upper and lower  respiratory specimens dur ing the acute phase of infection.  Positive  results are indicative of active infection with SARS-CoV-2.  Clinical  correlation with patient history and other diagnostic information is  necessary to determine patient infection status.  Positive results do  not rule out bacterial infection or co-infection with other viruses. If result is PRESUMPTIVE POSTIVE SARS-CoV-2 nucleic acids MAY BE PRESENT.   A presumptive positive result was obtained on the submitted specimen  and confirmed on repeat testing.  While 2019 novel coronavirus  (SARS-CoV-2) nucleic acids may be present in the submitted sample  additional confirmatory testing may be necessary for epidemiological  and / or clinical management purposes  to differentiate between  SARS-CoV-2 and other Sarbecovirus currently known to infect humans.  If clinically indicated additional testing with an alternate test  methodology [phone]) is advised. The SARS-CoV-2 RNA is generally  detectable in upper and lower respiratory sp ecimens during the acute  phase of infection. The expected result is Negative. Fact Sheet for Patients:  StrictlyIdeas.no Fact Sheet for Healthcare Providers: BankingDealers.co.za This test is not yet approved or cleared by the [place] FDA and has been authorized for detection and/or diagnosis of SARS-CoV-2 by FDA under an Emergency Use Authorization (EUA).  This EUA will remain in effect (meaning this test can be used) for the duration of the COVID-19 declaration under Section 564(b)(1) of the Act, 21 U.S.C. section 360bbb-3(b)(1), unless the authorization is terminated or revoked sooner. Performed at [facility], [place] [address] [place], [place] [address]   Culture, blood (routine x 2)     Status: None (Preliminary result)   Collection Time: 11/19/2018  9:30 AM   Specimen: BLOOD LEFT WRIST   Result Value Ref Range Status   Specimen Description BLOOD LEFT WRIST  Final   Special Requests   Final    BOTTLES DRAWN AEROBIC AND ANAEROBIC Blood Culture adequate volume   Culture   Final    NO GROWTH 3 DAYS Performed at [facility], [place] [address] [place], [place] [address]    Report Status PENDING  Incomplete  Urine culture     Status: None   Collection Time: 10/26/2018 10:27 AM   Specimen: Urine, Random  Result Value Ref Range Status   Specimen Description URINE, RANDOM  Final   Special Requests NONE  Final   Culture   Final    NO GROWTH Performed at [facility], [place] [address], [place], [place] [address]    Report Status 11/04/2018 FINAL  Final  Culture, respiratory (tracheal aspirate)     Status: None (Preliminary result)   Collection Time: 11/12/2018 10:27 AM   Specimen: Tracheal Aspirate; Respiratory  Result Value Ref Range Status   Specimen Description TRACHEAL ASPIRATE  Final   Special Requests NONE  Final   Gram Stain   Final    ABUNDANT WBC PRESENT, PREDOMINANTLY PMN MODERATE GRAM POSITIVE RODS MODERATE GRAM NEGATIVE RODS FEW GRAM POSITIVE COCCI IN PAIRS    Culture   Final    MODERATE SERRATIA MARCESCENS CULTURE REINCUBATED FOR BETTER GROWTH    Report Status PENDING  Incomplete   Organism ID, Bacteria SERRATIA MARCESCENS  Final      Susceptibility   Serratia marcescens - MIC*    CEFAZOLIN >=64 RESISTANT Resistant     CEFEPIME <=1  SENSITIVE Sensitive     CEFTAZIDIME <=1 SENSITIVE Sensitive     CEFTRIAXONE <=1 SENSITIVE Sensitive     CIPROFLOXACIN <=0.25 SENSITIVE Sensitive     GENTAMICIN <=1 SENSITIVE Sensitive     TRIMETH/SULFA Value in next row Sensitive      <=20 SENSITIVEPerformed at [facility], [address] [place], [place] [address]    * MODERATE SERRATIA MARCESCENS  Culture, blood (routine x 2)     Status: None (Preliminary result)   Collection Time: 11/11/2018 12:18 PM   Specimen: BLOOD  Result Value Ref Range Status    Specimen Description BLOOD SITE NOT SPECIFIED  Final   Special Requests   Final    BOTTLES DRAWN AEROBIC AND ANAEROBIC Blood Culture adequate volume   Culture   Final    NO GROWTH 3 DAYS Performed at [facility], [address], [place], [place] [address]    Report Status PENDING  Incomplete  Culture, blood (routine x 2)     Status: None (Preliminary result)   Collection Time: 11/21/2018 12:54 PM   Specimen: BLOOD  Result Value Ref Range Status   Specimen Description BLOOD A-LINE  Final   Special Requests   Final    BOTTLES DRAWN AEROBIC AND ANAEROBIC Blood Culture adequate volume   Culture   Final    NO GROWTH 3 DAYS Performed at [facility], [address] [place], [place] [address]    Report Status PENDING  Incomplete  MRSA PCR Screening     Status: None   Collection Time: 11/03/18  6:11 PM   Specimen: Nasal Mucosa; Nasopharyngeal  Result Value Ref Range Status   MRSA by PCR NEGATIVE NEGATIVE Final    Comment:        The GeneXpert MRSA Assay (FDA approved for NASAL specimens only), is one component of a comprehensive MRSA colonization surveillance program. It is not intended to diagnose MRSA infection nor to guide or monitor treatment for MRSA infections. Performed at [facility], [place] [address], [place], [place] [address]   Body fluid culture (includes gram stain)     Status: None (Preliminary result)   Collection Time: 11/05/18  9:09 AM   Specimen: Pleural Fluid  Result Value Ref Range Status   Specimen Description FLUID PERITONEAL  Final   Special Requests NONE  Final   Gram Stain   Final    FEW WBC PRESENT,BOTH PMN AND MONONUCLEAR NO ORGANISMS SEEN Performed at [facility], [address] [place], [place] [address]    Culture PENDING  Incomplete   Report Status PENDING  Incomplete    Coagulation Studies: Recent Labs    11/03/18 0841 11/04/18 0524 11/04/18 1346  LABPROT 21.2* 20.7* 21.9*  INR 1.9* 1.8* 1.9*     Urinalysis: No results for input(s): COLORURINE, LABSPEC, PHURINE, GLUCOSEU, HGBUR, BILIRUBINUR, KETONESUR, PROTEINUR, UROBILINOGEN, NITRITE, LEUKOCYTESUR in the last 72 hours.  Invalid input(s): APPERANCEUR    Imaging: Dg Chest 1 View  Result Date: 11/05/2018 CLINICAL DATA:  Encounter for central line placement EXAM: CHEST  1 VIEW COMPARISON:  Yesterday FINDINGS: Interval left IJ line with tip at the upper cavoatrial junction. Right IJ catheter in stable position. The enteric tube at least reaches the distal stomach. Endotracheal tube with tip just below the clavicular heads. Stable positioning of single chamber right ventricular ICD lead. Low volume chest with hazy opacity that is mildly increased above the right minor fissure. No visible effusion or pneumothorax.  IMPRESSION: 1. Left IJ line without complicating feature. 2. Other hardware is in stable position. 3. Mildly increased atelectasis about the right hilum, but largely stable aeration. Electronically Signed   By: Monte Fantasia M.D.   On: 11/05/2018 09:45   Dg Chest Port 1 View  Result Date: 11/04/2018 CLINICAL DATA:  Central line placement EXAM: PORTABLE CHEST 1 VIEW COMPARISON:  Earlier today FINDINGS: New right IJ line with tip at the SVC. No pneumothorax or new mediastinal widening. Endotracheal tube tip just below the clavicular heads. The orogastric tube at least reaches the stomach. Single chamber pacer/ICD into the right ventricle. Stable low volume chest with hazy opacity from atelectasis and pleural fluid based on recent CT. IMPRESSION: 1. New right IJ line without complicating feature. 2. Aeration and other hardware is stable from study earlier today. Electronically Signed   By: Monte Fantasia M.D.   On: 11/04/2018 11:26     Medications:   .  prismasol BGK 4/2.5 500 mL/hr at 11/05/18 2131  .  prismasol BGK 4/2.5 500 mL/hr at 11/05/18 2135  . sodium chloride    . sodium chloride Stopped (11/05/18 1100)  . sodium  chloride    . ceFEPime (MAXIPIME) IV Stopped (11/05/18 2042)  . dexmedetomidine (PRECEDEX) IV infusion 1.2 mcg/kg/hr (11/06/18 0600)  . dextrose 50 mL/hr at 11/06/18 0600  . feeding supplement (VITAL 1.5 CAL) 1,000 mL (11/05/18 1259)  . fentaNYL infusion INTRAVENOUS 150 mcg/hr (11/06/18 0605)  . norepinephrine (LEVOPHED) Adult infusion 2 mcg/min (11/06/18 0600)  . prismasol BGK 4/2.5 2,000 mL/hr at 11/06/18 0508  . propofol (DIPRIVAN) infusion Stopped (11/04/18 0815)  . vasopressin (PITRESSIN) infusion - *FOR SHOCK* Stopped (11/06/18 0343)   . amiodarone  400 mg Per Tube Daily  . B-complex with vitamin C  1 tablet Per Tube Daily  . chlorhexidine gluconate (MEDLINE KIT)  15 mL Mouth Rinse BID  . Chlorhexidine Gluconate Cloth  6 each Topical Daily  . hydrocortisone sod succinate (SOLU-CORTEF) inj  50 mg Intravenous Q6H  . influenza vac split quadrivalent PF  0.5 mL Intramuscular Tomorrow-1000  . ipratropium-albuterol  3 mL Nebulization QID  . lactulose  10 g Per Tube TID  . mouth rinse  15 mL Mouth Rinse 10 times per day  . pantoprazole (PROTONIX) IV  40 mg Intravenous Q12H  . sodium chloride flush  10-40 mL Intracatheter Q12H   Place/Maintain arterial line **AND** sodium chloride, fentaNYL, heparin, labetalol, sodium chloride, sodium chloride flush  Assessment/ Plan:   ESRD -usually Tuesday Thursday Saturday dialysis.  Initiated with CRRT 11/04/2018 appears to be stable.  No changes to prescription at this time.  Acute hypoxic respiratory failure following cardiac arrest patient intubated  Status post cardiac arrest due to excessive sedation reduced LV function appreciate assistance from Dr. Einar Gip.  Diffuse hypokinesia on 2D echo no regional wall abnormality  Shock continues on pressors IV Levophed and vasopressin.  Continues on Maxipime 2 g every 12 hours  Acute encephalopathy.  Receiving lactulose.  Hypoxic brain injury.  Negative CT scan of head 11/01/2018  Cirrhosis due to  hepatitis C has required paracenteses in the past  Volume removing about 50 -100 cc an hour on CRRT  Hyponatremia improved  ICD [employer] in situ interrogated 11/04/2018 no significant arrhythmias appreciate assistance  Lower GI bleed appreciate assistance from gastroenterology Dr Havery Moros.  Following recommendations.    LOS: [place] _0 _1 :58 AM

## 2018-11-06 NOTE — Progress Notes (Signed)
Lucile Shutters, MD called and notified of patient increased blood pressure. Patient off levophed. Verbal order to turn Vasopressin off for now.   Akhter, MD called and notified of patient's ICD/pacer seeming to not be capturing or sensing appropriately. No new orders at this time.

## 2018-11-06 NOTE — Progress Notes (Signed)
[place] Progress Note Patient Name: Eric [name] DOB: 09-15-1955 MRN: [id number]   Date of Service  11/06/2018  HPI/Events of Note  Patient with agitation compromising safe delivery of care.  eICU Interventions  Bilateral soft wrist restraints ordered.        Kerry Kass Sherley Leser 11/06/2018, 10:31 PM

## 2018-11-06 NOTE — Procedures (Signed)
Extubation Procedure Note  Patient Details:   Name: Eric [name] DOB: July 11, 1955 MRN: [id number]   Airway Documentation:    Vent end date: 11/06/18 Vent end time: 0842   Evaluation  O2 sats: stable throughout Complications: No apparent complications Patient did tolerate procedure well. Bilateral Breath Sounds: Clear, Diminished   Yes   Patient extubated per MD order. Positive cuff leak. No stridor noted. Patient tolerating well on 4L [place]. Vitals are stable at this time. RN at bedside.   [name] 11/06/2018, 8:46 AM

## 2018-11-06 NOTE — Plan of Care (Signed)
  Problem: Clinical Measurements: Goal: Ability to maintain clinical measurements within normal limits will improve Outcome: Progressing Goal: Respiratory complications will improve Outcome: Progressing Goal: Cardiovascular complication will be avoided Outcome: Progressing   Problem: Activity: Goal: Risk for activity intolerance will decrease Outcome: Progressing   Problem: Nutrition: Goal: Adequate nutrition will be maintained Outcome: Progressing   Problem: Coping: Goal: Level of anxiety will decrease Outcome: Progressing   Problem: Pain Managment: Goal: General experience of comfort will improve Outcome: Progressing   Problem: Safety: Goal: Ability to remain free from injury will improve Outcome: Progressing   Problem: Skin Integrity: Goal: Risk for impaired skin integrity will decrease Outcome: Progressing

## 2018-11-06 NOTE — Progress Notes (Signed)
PULMONARY / CRITICAL CARE MEDICINE   NAME:  Eric [name], MRN:  [id number], DOB:  08/08/1955, LOS: 4 ADMISSION DATE:  11/09/2018,  REFERRING MD: 74 department physician, CHIEF COMPLAINT: Status post- arrest  BRIEF HISTORY:    63 year old male with multiple medical comorbidities of cardiac arrest most likely secondary to respiratory event narcotics intubated in the emergency department on 11/09/2018. HISTORY OF PRESENT ILLNESS   63 year old with a plethora of health issues with included although not exclusive of hepatitis C, coronary artery disease status post ICD with a history of cardiac arrest.  Cirrhosis with normal ascites requiring monthly paracentesis.  End-stage renal disease but the right forearm AV graft.  Hypertension.  Type 2 diabetes mellitus.  He was in his usual state of poor health until 10/30/202021 is having significant pain primarily chest EMS was activated he was placed in the unit fentanyl 50 mcg notable was given to titrate his pain away.  Unfortunately 200 mcg in his right emergency department related to what is presumed to cardiac arrest respiratory insufficiency.  He required a short period of chest compressions with return of spontaneous circulation. At the time of this dictation he is not requiring vasoactive pressors.  Still on oral mechanical monitory support with FiO2 down down to 40%.  He did have some bloody secretions overnight.  He does have cirrhosis and there is has a coagulopathic state. Pulmonary critical care asked to admit.  We will not pursue temperature guided hypothermia due to his multiple comorbidities.  Currently he is a full code with his multiple health issues consideration may be made to address goals of care in the near future. SIGNIFICANT PAST MEDICAL HISTORY   Hepatitis C positive Diabetes mellitus Coronary artery disease status post cardiac arrest x2 AICD in place Hypertension Cirrhosis Congestive heart failure End-stage renal  disease  SIGNIFICANT EVENTS:  11/09/2018 cardiac arrest STUDIES:   11/05/2018 echo- LVEF 40 to 45% with mildly reduced LV function.  Normal RV size and function, normal atria.  Moderate to severe TR, otherwise normal valves. (Comparison 09/13/2018 echo- LVEF 55 to 60%, severely reduced RV systolic function)  CTA chest-no PE or aortic dissection, right upper lobe infiltrate with air bronchograms.  CULTURES:  11/06/2018 blood cultures x2>> never drawn 11/17/2018 urine culture>> 11/03/2018 respiratory cultures- abundant PMN, moderate GPR and GNR, few GPC Serratia marcescens sensitive to cefepime   ANTIBIOTICS:  11/18/2018 Unasyn for at least 3 days secondary to questionable aspiration while chest compressions>>  LINES/TUBES:  10/30/2018 endotracheal.>> 10/25/2018 right EJ>> 11/05/2018 CVL>> CONSULTANTS:  11/02/2018 cardiology SUBJECTIVE:  Currently meeting the criteria for spontaneous breathing trial possible extubation  CONSTITUTIONAL: BP (!) 145/59   Pulse (!) 59   Temp (!) 92.7 F (33.7 C) (Esophageal)   Resp 20   Ht 5\' 6"  (1.676 m)   Wt 78.1 kg   SpO2 100%   BMI 27.79 kg/m   I/O last 3 completed shifts: In: 3422.1 [I.V.:2223.7; NG/GT:505; IV Piggyback:693.5] Out: 4270 [Other:5774]  CVP:  [8 mmHg-16 mmHg] 16 mmHg  Vent Mode: PSV;CPAP FiO2 (%):  [30 %-40 %] 30 % Set Rate:  [14 bmp] 14 bmp Vt Set:  [450 mL] 450 mL PEEP:  [5 cmH20] 5 cmH20 Pressure Support:  [10 cmH20] 10 cmH20 Plateau Pressure:  [17 cmH20-20 cmH20] 17 cmH20  PHYSICAL EXAM: General: Intermittently awake and alert.  Currently hemodynamically stable on CRRT HEENT: Endotracheal tube is in place. Neuro: Intermittently following commands moving all extremity CV: Heart sounds are distant PULM: Creased breath sounds in the  bases GI: Less distended following paracentesis faint bowel sounds are noted Extremities: warm/dry, 2+ edema  Skin: no rashes or lesions  11/06/2018 chest x-ray reviewed hardware  intact with low lung volumes small pleural effusion left noted RESOLVED PROBLEM LIST   ASSESSMENT AND PLAN   # Acute hypoxic respiratory failure following cardiac arrest.  # Cardiac arrest of unclear origin s/p TTM # Encephalopathy combination delirium and hypoxic brain injury # Underlying HepC cirrhosis and portal hypertension, ascites # Persistent hypoglycemia in setting of cirrhosis, possible contribution of sepsis vs. Adrenal insufficiency but less likely # Underlying ESRD # Fluctuating Bps mostly related to sedation needs # Possible GIB vs. Oral bleeding, H/H stable, GI following #Periods of hypotension status post paracentesis 2700 cc status post MI.  11/06/2018 placed on spontaneous breathing trial questionable extubation. Currently on CRRT per 100 cc an hour negative.  Cortisol levels noted to be 80 Transition off D10 once tube feedings have been fully initiated Continue Zosyn monitor culture data, sputum culture with Serratia marcescens, currently on Maxipime for what Serratia sensitive to. Status post paracentesis 11/05/2018 with 2700 cc removed no culture data has returned from that Consideration for starting low-dose DVT prophylaxis Albumin for hypotension  Best Practice / Goals of Care / Disposition.   DVT PROPHYLAXIS: Sequential compression hose GI: PPI BID NUTRITION: Currently n.p.o. for questionable extubation MOBILITY: Bedrest GOALS OF CARE: full code FAMILY DISCUSSIONS: Family updated by phone (wife) DISPOSITION ICU   LABS  Glucose Recent Labs  Lab 11/05/18 1319 11/05/18 1500 11/05/18 2008 11/06/18 0001 11/06/18 0432 11/06/18 0801  GLUCAP 122* 133* 144* 170* 186* 189*    BMET Recent Labs  Lab 11/05/18 0909 11/05/18 1518 11/05/18 1705 11/06/18 0338  NA 134* 138 134* 134*  K 4.1 4.0 4.3 4.4  CL 98  --  99 100  CO2 22  --  24 21*  BUN 30*  --  23 17  CREATININE 4.89*  --  3.76* 2.40*  GLUCOSE 87  --  179* 230*    Liver Enzymes Recent Labs   Lab 11/01/2018 0753 11/03/18 0324  11/05/18 0909 11/05/18 1705 11/06/18 0338  AST 31 31  --   --   --   --   ALT 15 13  --   --   --   --   ALKPHOS 52 47  --   --   --   --   BILITOT 1.0 0.7  --   --   --   --   ALBUMIN 2.8* 2.4*   < > 2.3* 2.2* 2.7*   < > = values in this interval not displayed.    Electrolytes Recent Labs  Lab 11/03/18 0324  11/05/18 0416 11/05/18 0909 11/05/18 1705 11/06/18 0338  CALCIUM 8.7*   < > 8.4* 8.1* 8.1* 8.5*  MG 2.3  --  2.3  --   --  2.5*  PHOS 5.1*   < > 4.0 4.2 3.8 2.9   < > = values in this interval not displayed.    CBC Recent Labs  Lab 11/04/18 0524 11/04/18 1325 11/05/18 0416 11/05/18 1518  WBC 3.8* 6.4 11.0*  --   HGB 10.9* 9.6* 9.8* 9.9*  HCT 33.6* 30.5* 30.8* 29.0*  PLT 94* 75* 91*  --     ABG Recent Labs  Lab 11/08/2018 1253 11/03/18 0510 11/05/18 1518  PHART 7.610* 7.550* 7.284*  PCO2ART 23.8* 28.6* 50.9*  PO2ART 73.0* 124.0* 172.0*    Coag's Recent Labs  Lab 11/04/2018  1435 11/03/18 0841 11/04/18 0524 11/04/18 1346  APTT 33 33  --   --   INR 1.9* 1.9* 1.8* 1.9*    Sepsis Markers Recent Labs  Lab 10/29/2018 1436 11/03/2018 2006 11/03/18 1735 11/03/18 2002 11/05/18 0909  LATICACIDVEN 1.9 2.6* 3.9* 2.1*  --   PROCALCITON 0.64  --   --   --  24.85    Cardiac Enzymes No results for input(s): TROPONINI, PROBNP in the last 168 hours.    App cct 30 min   Richardson Landry Angeldejesus Callaham ACNP Maryanna Shape PCCM Pager [phone] till 1 pm If no answer page [phone] 11/06/2018, 8:33 AM

## 2018-11-06 NOTE — Progress Notes (Signed)
[facility] Progress Note Patient Name: Eric [name] DOB: February 07, 1955 MRN: [id number]   Date of Service  11/06/2018  HPI/Events of Note  Hypertensive on Norepinephrine + Vasopressin  eICU Interventions  RN ordered to discontinue Vasopressin for now.        Kerry Kass Tristen Pennino 11/06/2018, 3:45 AM

## 2018-11-07 ENCOUNTER — Inpatient Hospital Stay (HOSPITAL_COMMUNITY): Payer: Medicare Other

## 2018-11-07 DIAGNOSIS — E440 Moderate protein-calorie malnutrition: Secondary | ICD-10-CM | POA: Insufficient documentation

## 2018-11-07 LAB — CULTURE, BLOOD (ROUTINE X 2)
Culture: NO GROWTH
Culture: NO GROWTH
Culture: NO GROWTH
Special Requests: ADEQUATE
Special Requests: ADEQUATE
Special Requests: ADEQUATE

## 2018-11-07 LAB — CBC WITH DIFFERENTIAL/PLATELET
Abs Immature Granulocytes: 0.07 10*3/uL (ref 0.00–0.07)
Basophils Absolute: 0.1 10*3/uL (ref 0.0–0.1)
Basophils Relative: 1 %
Eosinophils Absolute: 0.1 10*3/uL (ref 0.0–0.5)
Eosinophils Relative: 1 %
HCT: 28.2 % — ABNORMAL LOW (ref 39.0–52.0)
Hemoglobin: 8.6 g/dL — ABNORMAL LOW (ref 13.0–17.0)
Immature Granulocytes: 1 %
Lymphocytes Relative: 2 %
Lymphs Abs: 0.2 10*3/uL — ABNORMAL LOW (ref 0.7–4.0)
MCH: 29.7 pg (ref 26.0–34.0)
MCHC: 30.5 g/dL (ref 30.0–36.0)
MCV: 97.2 fL (ref 80.0–100.0)
Monocytes Absolute: 0.7 10*3/uL (ref 0.1–1.0)
Monocytes Relative: 7 %
Neutro Abs: 8.9 10*3/uL — ABNORMAL HIGH (ref 1.7–7.7)
Neutrophils Relative %: 88 %
Platelets: 84 10*3/uL — ABNORMAL LOW (ref 150–400)
RBC: 2.9 MIL/uL — ABNORMAL LOW (ref 4.22–5.81)
RDW: 16.5 % — ABNORMAL HIGH (ref 11.5–15.5)
WBC: 10 10*3/uL (ref 4.0–10.5)
nRBC: 0 % (ref 0.0–0.2)

## 2018-11-07 LAB — GLUCOSE, CAPILLARY
Glucose-Capillary: 101 mg/dL — ABNORMAL HIGH (ref 70–99)
Glucose-Capillary: 105 mg/dL — ABNORMAL HIGH (ref 70–99)
Glucose-Capillary: 151 mg/dL — ABNORMAL HIGH (ref 70–99)
Glucose-Capillary: 36 mg/dL — CL (ref 70–99)
Glucose-Capillary: 65 mg/dL — ABNORMAL LOW (ref 70–99)
Glucose-Capillary: 71 mg/dL (ref 70–99)
Glucose-Capillary: 97 mg/dL (ref 70–99)

## 2018-11-07 LAB — RENAL FUNCTION PANEL
Albumin: 3 g/dL — ABNORMAL LOW (ref 3.5–5.0)
Anion gap: 13 (ref 5–15)
BUN: 18 mg/dL (ref 8–23)
CO2: 23 mmol/L (ref 22–32)
Calcium: 8.8 mg/dL — ABNORMAL LOW (ref 8.9–10.3)
Chloride: 99 mmol/L (ref 98–111)
Creatinine, Ser: 1.75 mg/dL — ABNORMAL HIGH (ref 0.61–1.24)
GFR calc Af Amer: 46 mL/min — ABNORMAL LOW (ref >60)
GFR calc non Af Amer: 40 mL/min — ABNORMAL LOW (ref >60)
Glucose, Bld: 129 mg/dL — ABNORMAL HIGH (ref 70–99)
Phosphorus: 1.6 mg/dL — ABNORMAL LOW (ref 2.5–4.6)
Potassium: 4.5 mmol/L (ref 3.5–5.1)
Sodium: 135 mmol/L (ref 135–145)

## 2018-11-07 LAB — MAGNESIUM: Magnesium: 2.6 mg/dL — ABNORMAL HIGH (ref 1.7–2.4)

## 2018-11-07 MED ORDER — DEXMEDETOMIDINE HCL IN NACL 400 MCG/100ML IV SOLN
0.4000 ug/kg/h | INTRAVENOUS | Status: DC
  Administered 2018-11-07: 1.2 ug/kg/h via INTRAVENOUS
  Filled 2018-11-07 (×2): qty 100

## 2018-11-07 MED ORDER — DEXTROSE 50 % IV SOLN
INTRAVENOUS | Status: AC
  Administered 2018-11-07: 50 mL
  Filled 2018-11-07: qty 50

## 2018-11-07 MED ORDER — HYDROCORTISONE NA SUCCINATE PF 100 MG IJ SOLR: 50.0000 mg | Freq: Two times a day (BID) | INTRAMUSCULAR | Status: DC

## 2018-11-07 MED ORDER — IPRATROPIUM-ALBUTEROL 0.5-2.5 (3) MG/3ML IN SOLN: 3.0000 mL | RESPIRATORY_TRACT | Status: DC | PRN

## 2018-11-07 MED ORDER — MAGNESIUM SULFATE 2 GM/50ML IV SOLN
2.0000 g | Freq: Once | INTRAVENOUS | Status: AC
  Administered 2018-11-07: 2 g via INTRAVENOUS
  Filled 2018-11-07: qty 50

## 2018-11-07 MED ORDER — HEPARIN SODIUM (PORCINE) 5000 UNIT/ML IJ SOLN
5000.0000 [IU] | Freq: Three times a day (TID) | INTRAMUSCULAR | Status: DC
  Administered 2018-11-07: 5000 [IU] via SUBCUTANEOUS
  Filled 2018-11-07: qty 1

## 2018-11-07 MED ORDER — MAGNESIUM SULFATE 2 GM/50ML IV SOLN
INTRAVENOUS | Status: AC
  Filled 2018-11-07: qty 50

## 2018-11-07 MED ORDER — SODIUM PHOSPHATES 45 MMOLE/15ML IV SOLN
20.0000 mmol | Freq: Once | INTRAVENOUS | Status: AC
  Administered 2018-11-07: 20 mmol via INTRAVENOUS
  Filled 2018-11-07: qty 6.67

## 2018-11-07 MED FILL — Medication: Qty: 1 | Status: AC

## 2018-11-08 ENCOUNTER — Other Ambulatory Visit: Payer: Medicare Other

## 2018-11-08 LAB — BODY FLUID CULTURE: Culture: NO GROWTH

## 2018-11-13 ENCOUNTER — Telehealth: Payer: Self-pay | Admitting: Internal Medicine

## 2018-11-13 NOTE — Telephone Encounter (Signed)
15/86/8257 - Death Cert signed by Dr. Melvyn Novas. [place] for pickup.

## 2018-11-22 ENCOUNTER — Ambulatory Visit: Payer: Medicare Other | Admitting: Cardiology

## 2018-11-24 NOTE — Progress Notes (Signed)
Chaplain responded to the Code Blue and contacted the family.  Brion Aliment Chaplain Resident For questions concerning this note please contact me by pager [phone]

## 2018-11-24 NOTE — Discharge Summary (Signed)
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  He eventually woke up and was able to be extubated but remained in shock and encephalopathic despite lactulose, antibiotics, stress steroids, and CRRT.  Unfortunately in the afternoon of 11-19-18, he suffered what appeared to be a massive aspiration event leading to another cardiac arrest that he was unable to be revived.  Erskine Emery MD PCCM

## 2018-11-24 NOTE — Progress Notes (Signed)
PT Cancellation Note  Patient Details Name: Eric [name] MRN: [id number] DOB: [birth year]   Cancelled Treatment:    Reason Eval/Treat Not Completed: Patient not medically ready Pt not following commands and still on CRRT   Karlton Lemon 11-24-18, 12:16 PM

## 2018-11-24 NOTE — Evaluation (Signed)
Clinical/Bedside Swallow Evaluation Patient Details  Name: Eric [name] MRN: [id number] Date of Birth: Jun 05, 1955  Today's Date: November 26, 2018 Time: SLP Start Time (ACUTE ONLY): 7902 SLP Stop Time (ACUTE ONLY): 0852 SLP Time Calculation (min) (ACUTE ONLY): 15 min  Past Medical History:  Past Medical History:  Diagnosis Date  . AICD (automatic cardioverter/defibrillator) present   . Cardiac arrest ([place]) 03/07/2013  . Cardiac arrest due to underlying cardiac condition ([place])   . CHF (congestive heart failure) ([place])   . Complication of anesthesia    got too much anesthesia and bp dropped very low Feb, 2019  . Decompensated HCV cirrhosis ([place])   . Edema   . Encounter for fitting or adjustment of implantable cardioverter-defibrillator (ICD)   . ESRD on dialysis [facility])    began HD in 2018  . History of blood transfusion 1950's   "related to pinal menigitis; HAD 16 OPERATIONS TOTAL"  . History of ventricular fibrillation 03/23/2013  . Hyperlipemia   . Hypertension   . Ileus ([place]) 05/2015  . Myocardial infarction ([place]) 03/2013  . Pain in the chest   . Pneumonia 2016  . Rash and nonspecific skin eruption 06/29/2014  . Scabies infestation 06/29/2014  . Shortness of breath dyspnea   . Type II diabetes mellitus ([place])    type 2  . Ventricular tachycardia [facility])    Past Surgical History:  Past Surgical History:  Procedure Laterality Date  . AV FISTULA PLACEMENT Right 03/18/2015   Procedure: BRACHIOCEPHALIC ARTERIOVENOUS (AV) FISTULA CREATION;  Surgeon: Angelia Mould, MD;  Location: [place];  Service: Vascular;  Laterality: Right;  . BACK SURGERY  1950's   "for spinal menigitis; HAD 16 OPERATIONS TOTAL"  . CARDIAC CATHETERIZATION    . CHOLECYSTECTOMY N/A 06/17/2015   Procedure: LAPAROSCOPIC CHOLECYSTECTOMY;  Surgeon: Coralie Keens, MD;  Location: [place];  Service: General;  Laterality: N/A;  . CORONARY ARTERY BYPASS GRAFT  1999   cabg x4  . 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 HEAD SURGERY  1950'S   "for spinal menigitis; HAD 16 OPERATIONS TOTAL"  . IMPLANTABLE CARDIOVERTER DEFIBRILLATOR IMPLANT  03/24/2013   STJ single chamber ICD implanted by Dr Caryl Comes for secondary prevention  . IMPLANTABLE CARDIOVERTER DEFIBRILLATOR IMPLANT N/A 03/24/2013   Procedure: IMPLANTABLE CARDIOVERTER DEFIBRILLATOR IMPLANT;  Surgeon: Deboraha Sprang, MD;  Location: [facility] CATH LAB;  Service: Cardiovascular;  Laterality: N/A;  . INSERTION OF DIALYSIS CATHETER N/A 03/13/2015   Procedure: INSERTION OF TUNNELED DIALYSIS CATHETER;  Surgeon: Conrad [place], MD;  Location: [place];  Service: Vascular;  Laterality: N/A;  . IR PARACENTESIS  08/20/2018  . IR PARACENTESIS  09/11/2018  . LEFT HEART CATHETERIZATION WITH CORONARY/GRAFT ANGIOGRAM  03/07/2013   Procedure: LEFT HEART CATHETERIZATION WITH [name];  Surgeon: Clent Demark, MD;  Location: [facility] CATH LAB;  Service: Cardiovascular;;  . REVISON OF ARTERIOVENOUS FISTULA Right 01/03/2018   Procedure: REVISION OF ARTERIOVENOUS FISTULA;  Surgeon: Waynetta Sandy, MD;  Location: [place];  Service: Vascular;  Laterality: Right;  . REVISON OF ARTERIOVENOUS FISTULA Right 09/16/2018   Procedure: REVISION PLICATION OF ARTERIOVENOUS FISTULA RIGHT ARM;  Surgeon: Angelia Mould, MD;  Location: [place];  Service: Vascular;  Laterality: Right;  . UMBILICAL HERNIA REPAIR N/A 06/24/2015   Procedure: HERNIA REPAIR UMBILICAL ADULT;  Surgeon: Georganna Skeans, MD;  Location: [place];  Service: General;  Laterality: N/A;   HPI:  Pt is a 63 yo male admitted with cardiac  arrest likely secondary to respiratory event. ETT 10/10-10/14. Pt had a h/o post-extubation dysphagia in 2015, advancing to Dys 3 diet and thin liquids prior to discharge. PMH: hepatitis C, CHF, CAD s/p ICD, cardiac arrest, cirrhosis with recurrent ascites, ESRD on HD, HTN, DM, PNA   Assessment / Plan / Recommendation Clinical Impression  Pt appears to be at  an increased risk for aspiration given current mentation, although minimal amounts of overt s/s of reduced airway protection are noted. His oral cavity is dry and coated with dried secretions and dried blood. Oral care was completed, with additional boluses of ice and water helping further to provide some moisture and allow for removal of some of these secretions from his tongue and hard/soft palate. He needs frequent redirection to PO trials, but he consistently appears to trigger a swallow per hyolaryngeal palpation. For today would focus heavily on oral hygiene, providing oral care and utilizing a few ice chips to then provide some additional moisture. Meds could be given crushed in puree. SLP will f/u for what looks like good prognosis for return to a PO diet as his mentation begins to clear.   SLP Visit Diagnosis: Dysphagia, unspecified (R13.10)    Aspiration Risk  Moderate aspiration risk    Diet Recommendation NPO except meds;Ice chips PRN after oral care   Medication Administration: Crushed with puree    Other  Recommendations Oral Care Recommendations: Oral care QID;Oral care prior to ice chip/H20 Other Recommendations: Have oral suction available   Follow up Recommendations (tba)      Frequency and Duration min 2x/week  2 weeks       Prognosis Prognosis for Safe Diet Advancement: Good Barriers to Reach Goals: Cognitive deficits      Swallow Study   General HPI: Pt is a 63 yo male admitted with cardiac arrest likely secondary to respiratory event. ETT 10/10-10/14. Pt had a h/o post-extubation dysphagia in 2015, advancing to Dys 3 diet and thin liquids prior to discharge. PMH: hepatitis C, CHF, CAD s/p ICD, cardiac arrest, cirrhosis with recurrent ascites, ESRD on HD, HTN, DM, PNA Type of Study: Bedside Swallow Evaluation Previous Swallow Assessment: see HPI Diet Prior to this Study: NPO Temperature Spikes Noted: No Respiratory Status: Nasal cannula History of Recent  Intubation: Yes Length of Intubations (days): 5 days Date extubated: 11/06/18 Behavior/Cognition: Alert;Cooperative;Confused;Requires cueing;Other (Comment)(restless) Oral Cavity Assessment: Dry;Dried secretions;Other (comment)(dried blood) Oral Care Completed by SLP: Yes Oral Cavity - Dentition: Poor condition Self-Feeding Abilities: Total assist Patient Positioning: Upright in bed Baseline Vocal Quality: Normal    Oral/Motor/Sensory Function Overall Oral Motor/Sensory Function: (difficulty following commands; no overt focal weakness)   Ice Chips Ice chips: Impaired Presentation: Spoon Oral Phase Impairments: Poor awareness of bolus   Thin Liquid Thin Liquid: Impaired Presentation: Spoon;Straw Oral Phase Impairments: Poor awareness of bolus Pharyngeal  Phase Impairments: Cough - Delayed    Nectar Thick Nectar Thick Liquid: Not tested   Honey Thick Honey Thick Liquid: Not tested   Puree Puree: Impaired Presentation: Spoon Oral Phase Impairments: Poor awareness of bolus   Solid     Solid: Not tested      Venita Sheffield Lashena Signer 11-16-18,9:40 AM  Pollyann Glen, M.A. [place] Acute Environmental education officer [phone] Office [phone]

## 2018-11-24 NOTE — Procedures (Signed)
Intubation Indication: cardiac arrest Using MAC4, no anesthesia, patient intubated without issue, copious vomitus suctioned from oropharynx and vocal cord area  Erskine Emery MD PCCM

## 2018-11-24 NOTE — Progress Notes (Signed)
Called to pt's room by RN.  Pt was bradycardic and apneic.  CPR initiated.  Pt bagged with 100% o2 BVM until ICU physican intubated pt.  Pt placed on vent briefly but lost pulses and CPR continued until code blue was discontinued by physician at bedside.

## 2018-11-24 NOTE — Progress Notes (Signed)
Nutrition Follow-up  DOCUMENTATION CODES:   Non-severe (moderate) malnutrition in context of chronic illness  INTERVENTION:   Once Cortrak placed, initiate tube feeds: - Vital 1.5 @ 50 ml/hr (1200 ml/day) - Pro-stat 60 ml BID  Tube feeding regimen provides 2200 kcal, 141 grams of protein, and 917 ml of H2O.  - RD will monitor for diet advancement and supplement as appropriate  - Continue B-complex with vitamin C  NUTRITION DIAGNOSIS:   Moderate Malnutrition related to chronic illness (ESRD on HD, CHF, cirrhosis) as evidenced by moderate fat depletion, moderate muscle depletion, severe muscle depletion.  Ongoing, being addressed via initiation of TF  GOAL:   Patient will meet greater than or equal to 90% of their needs  Progressing  MONITOR:   Diet advancement, Weight trends, Skin, I & O's  REASON FOR ASSESSMENT:   Consult Enteral/tube feeding initiation and management  ASSESSMENT:   Patient with PMH significant for hepatitis C, CHF, CAD s/p ICD, cardiac arrest, cirrhosis with recurrent ascites, ESRD on HD, HTN, and DM. Presents this admission with cardiac arrest.  10/10 - trickle TF initiated 10/12 - CRRT initiated, TF held 10/13 - s/p paracentesis with 2700 ml of fluid removed 10/14 - extubated  Discussed pt with RN and during ICU rounds. Pt remains on CRRT with plans to d/c today after filter clots.  SLP recommending NPO except meds.  RD consulted for TF initiation and management. RN unable to place NG tube at bedside. Per discussion with CCM, okay to wait until Cortrak can be placed by Cortrak team tomorrow.  Medications reviewed and include: B-complex with vitamin C, Solu-cortef, lactulose, Protonix, IV abx, IV magnesium sulfate 2 grams once, IV sodium phosphate 20 mmol once, Precedex IVF: D10 @ 20 ml/hr  Labs reviewed: phosphorus 1.6, magnesium 2.6, hemoglobin 8.6 CBG's: 65-183 x 24 hours  CRRT net UF: 4396 ml x 24 hours I/O's: -2.7 L since  admit  Diet Order:   Diet Order            Diet NPO time specified  Diet effective now              EDUCATION NEEDS:   Not appropriate for education at this time  Skin:  Skin Assessment: Skin Integrity Issues: Skin Integrity Issues: Diabetic Ulcer: left leg Incisions: abdomen  Last BM:  11/04/18  Height:   Ht Readings from Last 1 Encounters:  11/12/2018 5\' 6"  (1.676 m)    Weight:   Wt Readings from Last 1 Encounters:  12-01-18 78.1 kg    Ideal Body Weight:  64.5 kg  BMI:  Body mass index is 27.79 kg/m.  Estimated Nutritional Needs:   Kcal:  2100-2300  Protein:  120-140 grams  Fluid:  1000 ml + UOP    Gaynell Face, MS, RD, LDN Inpatient Clinical Dietitian Pager: [phone] Weekend/After Hours: [phone]

## 2018-11-24 NOTE — Procedures (Signed)
Bronchoscopy Indication: emergent, check tube position and suction out any large obstruction from massive aspiration event Bronchoscope advanced through ETT while patient being bagged using adapter. ETT in good position No endobronchial obstruction.  Erskine Emery MD PCCM

## 2018-11-24 NOTE — Progress Notes (Signed)
[facility] ROUNDING NOTE   Subjective:   This is a 63 year old history of coronary artery disease status post ICD with history of cardiac arrest.  Cirrhosis and history of hepatitis C with ascites requiring monthly paracentesis, end-stage renal disease, hypertension diabetes mellitus type 2.  On 10/11/202021 he was brought to the emergency room with chest pain and whilst in the emergency room sustained a acute cardiac arrest with respiratory insufficiency requiring brief period of chest compressions.   He continues to be intubated at this time and is currently receiving CRRT removing about 100 cc an hour as tolerated.  Net -2.4 L 11/06/2018  Blood pressure 103/46 pulse 84 temperature 98.4 O2 sats 92% 4 L nasal cannula  Sodium 135 potassium 4.5 chloride 99 CO2 23 BUN 18 creatinine 1.75 glucose 129 phosphorus 1.6 albumin 3.0 magnesium 2.6 WBC 10.0 hemoglobin 8.6 platelets 84  2D echo EF 40 to 45% mildly reduced LV function.  CT scan chest no pulmonary embolus aortic dissection right upper lobe infiltrate with bronchograms.  Amiodarone 400 mg daily, duo nebs 4 times daily, lactulose 10 g 3 times daily, Protonix 40 mg daily, hydrocortisone 50 mg every 6 hours, midodrine 10 mg 3 times daily  IV Maxipime 2 g every 12 hours IV Levophed     Objective:  Vital signs in last 24 hours:  Temp:  [92.7 F (33.7 C)-99.2 F (37.3 C)] 98.4 F (36.9 C) (10/15 0400) Pulse Rate:  [58-94] 94 (10/15 0600) Resp:  [9-29] 24 (10/15 0600) BP: (114-141)/(61-126) 141/126 (10/15 0400) SpO2:  [68 %-100 %] 100 % (10/15 0600) Arterial Line BP: (98-148)/(45-67) 116/51 (10/15 0600) FiO2 (%):  [30 %] 30 % (10/14 0820) Weight:  [78.1 kg] 78.1 kg (10/15 0247)  Weight change: 0 kg Filed Weights   11/05/18 0500 11/06/18 0400 Nov 12, 2018 0247  Weight: 84.4 kg 78.1 kg 78.1 kg    Intake/Output: I/O last 3 completed shifts: In: 1096 [I.V.:1767.1; NG/GT:1025.8; IV Piggyback:600.1] Out: 7274  [Other:7274]   Intake/Output this shift:  No intake/output data recorded.  General: Chronically ill-appearing man laying in bed , quite agitated this morning HEENT: Clear oropharynx Cardiovascular: Regular rate and rhythm, pacing.  No murmurs.   Lungs: Improved rhonchi, less secretions from ET tube. Abdomen: Distended, soft, nontender Musculoskeletal: Left lower extremity pitting edema, mildly improved since yesterday.  No clubbing.  Right upper arm fistula with thrill. Skin: Chronic discoloration bilateral lower extremities.   Basic Metabolic Panel: Recent Labs  Lab 11/02/18 0753  11/03/18 0324  11/05/18 0416 11/05/18 0909  11/05/18 1705 11/06/18 0338 11/06/18 1236 11/06/18 1521 12-Nov-2018 0319  NA 131*   < > 131*   < > 133* 134*   < > 134* 134* 137 137 135  K 4.1   < > 3.8   < > 4.3 4.1   < > 4.3 4.4 5.3* 4.5 4.5  CL 92*   < > 92*   < > 96* 98  --  99 100  --  100 99  CO2 23   < > 21*   < > 22 22  --  24 21*  --  22 23  GLUCOSE 128*   < > 152*   < > 79 87  --  179* 230*  --  175* 129*  BUN 37*   < > 45*   < > 31* 30*  --  23 17  --  14 18  CREATININE 7.24*   < > 8.23*   < > 5.18* 4.89*  --  3.76* 2.40*  --  2.06* 1.75*  CALCIUM 8.7*   < > 8.7*   < > 8.4* 8.1*  --  8.1* 8.5*  --  8.7* 8.8*  MG 2.2  --  2.3  --  2.3  --   --   --  2.5*  --   --  2.6*  PHOS 5.0*  --  5.1*   < > 4.0 4.2  --  3.8 2.9  --  2.9 1.6*   < > = values in this interval not displayed.    Liver Function Tests: Recent Labs  Lab 11/18/2018 0753 11/03/18 0324  11/05/18 0909 11/05/18 1705 11/06/18 0338 11/06/18 1521 2018-11-29 0319  AST 31 31  --   --   --   --   --   --   ALT 15 13  --   --   --   --   --   --   ALKPHOS 52 47  --   --   --   --   --   --   BILITOT 1.0 0.7  --   --   --   --   --   --   PROT 7.4 6.7  --   --   --   --   --   --   ALBUMIN 2.8* 2.4*   < > 2.3* 2.2* 2.7* 3.2* 3.0*   < > = values in this interval not displayed.   Recent Labs  Lab 10/26/2018 1436  LIPASE 25  AMYLASE  132*   Recent Labs  Lab 10/26/2018 0930  AMMONIA 72*    CBC: Recent Labs  Lab 11/18/2018 0803  11/03/18 0324  11/04/18 0524 11/04/18 1325 11/05/18 0416 11/05/18 1518 11/06/18 1236 29-Nov-2018 0319  WBC 6.8   < > 7.9  --  3.8* 6.4 11.0*  --   --  10.0  NEUTROABS 3.4  --   --   --  3.4  --   --   --   --  8.9*  HGB 9.6*   < > 9.9*   < > 10.9* 9.6* 9.8* 9.9* 10.9* 8.6*  HCT 31.6*   < > 31.1*   < > 33.6* 30.5* 30.8* 29.0* 32.0* 28.2*  MCV 103.6*   < > 96.6  --  96.6 97.4 95.7  --   --  97.2  PLT 58*   < > 89*  --  94* 75* 91*  --   --  84*   < > = values in this interval not displayed.    Cardiac Enzymes: Recent Labs  Lab 10/24/2018 0753  CKTOTAL 116    BNP: Invalid input(s): POCBNP  CBG: Recent Labs  Lab 11/06/18 1137 11/06/18 1527 11/06/18 1935 11-29-18 0009 11-29-2018 0324  GLUCAP 176* 162* 183* 105* 101*    Microbiology: Results for orders placed or performed during the hospital encounter of 11/05/2018  SARS Coronavirus 2 by RT PCR (hospital order, performed in [facility] hospital lab) Nasopharyngeal Nasopharyngeal Swab     Status: None   Collection Time: 11/11/2018  7:32 AM   Specimen: Nasopharyngeal Swab  Result Value Ref Range Status   SARS Coronavirus 2 NEGATIVE NEGATIVE Final    Comment: (NOTE) If result is NEGATIVE SARS-CoV-2 target nucleic acids are NOT DETECTED. The SARS-CoV-2 RNA is generally detectable in upper and lower  respiratory specimens during the acute phase of infection. The lowest  concentration of SARS-CoV-2 viral copies this assay can detect is 250  copies / mL. A negative result does not preclude SARS-CoV-2 infection  and should not be used as the sole basis for treatment or other  patient management decisions.  A negative result may occur with  improper specimen collection / handling, submission of specimen other  than nasopharyngeal swab, presence of viral mutation(s) within the  areas targeted by this assay, and inadequate number of viral  copies  (<250 copies / mL). A negative result must be combined with clinical  observations, patient history, and epidemiological information. If result is POSITIVE SARS-CoV-2 target nucleic acids are DETECTED. The SARS-CoV-2 RNA is generally detectable in upper and lower  respiratory specimens dur ing the acute phase of infection.  Positive  results are indicative of active infection with SARS-CoV-2.  Clinical  correlation with patient history and other diagnostic information is  necessary to determine patient infection status.  Positive results do  not rule out bacterial infection or co-infection with other viruses. If result is PRESUMPTIVE POSTIVE SARS-CoV-2 nucleic acids MAY BE PRESENT.   A presumptive positive result was obtained on the submitted specimen  and confirmed on repeat testing.  While 2019 novel coronavirus  (SARS-CoV-2) nucleic acids may be present in the submitted sample  additional confirmatory testing may be necessary for epidemiological  and / or clinical management purposes  to differentiate between  SARS-CoV-2 and other Sarbecovirus currently known to infect humans.  If clinically indicated additional testing with an alternate test  methodology [phone]) is advised. The SARS-CoV-2 RNA is generally  detectable in upper and lower respiratory sp ecimens during the acute  phase of infection. The expected result is Negative. Fact Sheet for Patients:  StrictlyIdeas.no Fact Sheet for Healthcare Providers: BankingDealers.co.za This test is not yet approved or cleared by the [place] FDA and has been authorized for detection and/or diagnosis of SARS-CoV-2 by FDA under an Emergency Use Authorization (EUA).  This EUA will remain in effect (meaning this test can be used) for the duration of the COVID-19 declaration under Section 564(b)(1) of the Act, 21 U.S.C. section 360bbb-3(b)(1), unless the authorization is terminated  or revoked sooner. Performed at [facility], [place] [address] [place], [place] [address]   Culture, blood (routine x 2)     Status: None (Preliminary result)   Collection Time: 10/24/2018  9:30 AM   Specimen: BLOOD LEFT WRIST  Result Value Ref Range Status   Specimen Description BLOOD LEFT WRIST  Final   Special Requests   Final    BOTTLES DRAWN AEROBIC AND ANAEROBIC Blood Culture adequate volume   Culture   Final    NO GROWTH 3 DAYS Performed at [facility], [place] [address], [place], [place] [address]    Report Status PENDING  Incomplete  Urine culture     Status: None   Collection Time: 11/13/2018 10:27 AM   Specimen: Urine, Random  Result Value Ref Range Status   Specimen Description URINE, RANDOM  Final   Special Requests NONE  Final   Culture   Final    NO GROWTH Performed at [facility], [place] [address] [place], [place] [address]    Report Status 11/04/2018 FINAL  Final  Culture, respiratory (tracheal aspirate)     Status: None   Collection Time: 10/25/2018 10:27 AM   Specimen: Tracheal Aspirate; Respiratory  Result Value Ref Range Status   Specimen Description TRACHEAL ASPIRATE  Final   Special Requests NONE  Final   Gram Stain   Final  ABUNDANT WBC PRESENT, PREDOMINANTLY PMN MODERATE GRAM POSITIVE RODS MODERATE GRAM NEGATIVE RODS FEW GRAM POSITIVE COCCI IN PAIRS Performed at [facility], [place] [address] [place], [place] [address]    Culture MODERATE SERRATIA MARCESCENS  Final   Report Status 11/06/2018 FINAL  Final   Organism ID, Bacteria SERRATIA MARCESCENS  Final      Susceptibility   Serratia marcescens - MIC*    CEFAZOLIN >=64 RESISTANT Resistant     CEFEPIME <=1 SENSITIVE Sensitive     CEFTAZIDIME <=1 SENSITIVE Sensitive     CEFTRIAXONE <=1 SENSITIVE Sensitive     CIPROFLOXACIN <=0.25 SENSITIVE Sensitive     GENTAMICIN <=1 SENSITIVE Sensitive     TRIMETH/SULFA <=20 SENSITIVE Sensitive     * MODERATE SERRATIA MARCESCENS  Culture,  blood (routine x 2)     Status: None (Preliminary result)   Collection Time: 10/24/2018 12:18 PM   Specimen: BLOOD  Result Value Ref Range Status   Specimen Description BLOOD SITE NOT SPECIFIED  Final   Special Requests   Final    BOTTLES DRAWN AEROBIC AND ANAEROBIC Blood Culture adequate volume   Culture   Final    NO GROWTH 3 DAYS Performed at [facility], [address] [place], [place] [address]    Report Status PENDING  Incomplete  Culture, blood (routine x 2)     Status: None (Preliminary result)   Collection Time: 10/30/2018 12:54 PM   Specimen: BLOOD  Result Value Ref Range Status   Specimen Description BLOOD A-LINE  Final   Special Requests   Final    BOTTLES DRAWN AEROBIC AND ANAEROBIC Blood Culture adequate volume   Culture   Final    NO GROWTH 3 DAYS Performed at [facility], [address], [place], [place] [address]    Report Status PENDING  Incomplete  MRSA PCR Screening     Status: None   Collection Time: 11/03/18  6:11 PM   Specimen: Nasal Mucosa; Nasopharyngeal  Result Value Ref Range Status   MRSA by PCR NEGATIVE NEGATIVE Final    Comment:        The GeneXpert MRSA Assay (FDA approved for NASAL specimens only), is one component of a comprehensive MRSA colonization surveillance program. It is not intended to diagnose MRSA infection nor to guide or monitor treatment for MRSA infections. Performed at [facility], [place] [address] [place], [place] [address]   Body fluid culture (includes gram stain)     Status: None (Preliminary result)   Collection Time: 11/05/18  9:09 AM   Specimen: Pleural Fluid  Result Value Ref Range Status   Specimen Description FLUID PERITONEAL  Final   Special Requests NONE  Final   Gram Stain   Final    FEW WBC PRESENT,BOTH PMN AND MONONUCLEAR NO ORGANISMS SEEN    Culture   Final    NO GROWTH < 24 HOURS Performed at [facility], [place] [address], [place], [place] [address]    Report Status PENDING   Incomplete    Coagulation Studies: Recent Labs    11/04/18 1346  LABPROT 21.9*  INR 1.9*    Urinalysis: No results for input(s): COLORURINE, LABSPEC, PHURINE, GLUCOSEU, HGBUR, BILIRUBINUR, KETONESUR, PROTEINUR, UROBILINOGEN, NITRITE, LEUKOCYTESUR in the last 72 hours.  Invalid input(s): APPERANCEUR    Imaging: Dg Chest 1 View  Result Date: 11/06/2018 CLINICAL DATA:  Cardiac arrest EXAM: CHEST  1 VIEW COMPARISON:  Yesterday FINDINGS: Endotracheal tube tip  just below the clavicular heads. The orogastric tube tip reaches the mid stomach at least. A thermistor reaches the GE junction. Bilateral central line with right-sided tip at the brachiocephalic confluence and left-sided tip at the upper cavoatrial junction. Stable right ventricular ICD lead positioning. Low volume chest with atelectasis and presumed pleural fluid. Cardiomegaly IMPRESSION: 1. Stable and unremarkable hardware. 2. Stable low volumes with atelectasis and possible small pleural effusion. Electronically Signed   By: Monte Fantasia M.D.   On: 11/06/2018 07:09   Dg Chest 1 View  Result Date: 11/05/2018 CLINICAL DATA:  Encounter for central line placement EXAM: CHEST  1 VIEW COMPARISON:  Yesterday FINDINGS: Interval left IJ line with tip at the upper cavoatrial junction. Right IJ catheter in stable position. The enteric tube at least reaches the distal stomach. Endotracheal tube with tip just below the clavicular heads. Stable positioning of single chamber right ventricular ICD lead. Low volume chest with hazy opacity that is mildly increased above the right minor fissure. No visible effusion or pneumothorax. IMPRESSION: 1. Left IJ line without complicating feature. 2. Other hardware is in stable position. 3. Mildly increased atelectasis about the right hilum, but largely stable aeration. Electronically Signed   By: Monte Fantasia M.D.   On: 11/05/2018 09:45   Dg Abd 1 View  Result Date: 11/06/2018 CLINICAL DATA:  NG tube  placement EXAM: ABDOMEN - 1 VIEW COMPARISON:  11/06/2018 FINDINGS: Post sternotomy changes. Left-sided pacing device as before. Vascular catheter sheath over the SVC. Esophageal tube tip overlies the mid gastric region, side-port projects over expected location of GE junction. Enlarged cardiomediastinal silhouette. IMPRESSION: 1. Esophageal tube tip overlies the mid gastric region, side-port projects over GE junction, further advancement could be considered for more optimal positioning 2. Cardiomegaly. Electronically Signed   By: Donavan Foil M.D.   On: 11/06/2018 22:41   Dg Chest Port 1 View  Result Date: 2018-11-12 CLINICAL DATA:  Respiratory failure. EXAM: PORTABLE CHEST 1 VIEW COMPARISON:  November 06, 2018. FINDINGS: Stable cardiomegaly with central pulmonary vascular congestion. No pneumothorax or pleural effusion is noted. Nasogastric tube tip is seen in distal esophagus. Right internal jugular catheter is unchanged in position. Left-sided pacemaker is unchanged in position. Left internal jugular catheter is unchanged. Mild bibasilar subsegmental atelectasis is noted. Bony thorax is unremarkable. IMPRESSION: Stable cardiomegaly with central pulmonary vascular congestion. Nasogastric tube tip seen in distal esophagus. Tracheostomy tube has been removed. Otherwise stable support apparatus. Mild bibasilar subsegmental atelectasis. Electronically Signed   By: Marijo Conception M.D.   On: 11-12-2018 07:08   Dg Abd Portable 1v  Result Date: 11/06/2018 CLINICAL DATA:  Nasogastric tube placement EXAM: PORTABLE ABDOMEN - 1 VIEW COMPARISON:  10/27/2018 FINDINGS: Nasogastric tube courses below the diaphragm with distal tip and side hole projecting within the expected location of the gastric body. Visualized bowel gas pattern is nonspecific. Cardiomegaly. Median sternotomy wires. IMPRESSION: Satisfactory positioning of nasogastric tube. Electronically Signed   By: Davina Poke M.D.   On: 11/06/2018 13:42      Medications:   .  prismasol BGK 4/2.5 500 mL/hr at 11/06/18 1806  .  prismasol BGK 4/2.5 500 mL/hr at 11/06/18 0741  . sodium chloride    . sodium chloride Stopped (11/05/18 1100)  . sodium chloride    . ceFEPime (MAXIPIME) IV Stopped (11/06/18 2140)  . dextrose 20 mL/hr at 12-Nov-2018 0700  . feeding supplement (VITAL 1.5 CAL) 50 mL/hr at 11/06/18 2000  . norepinephrine (LEVOPHED) Adult infusion Stopped (November 12, 2018  63)  Elmon Else BGK 4/2.5 2,000 mL/hr at 11/06/18 1807  . propofol (DIPRIVAN) infusion Stopped (11/04/18 0815)  . sodium phosphate  Dextrose 5% IVPB 20 mmol (2018-11-24 0716)  . vasopressin (PITRESSIN) infusion - *FOR SHOCK* Stopped (11/06/18 0343)   . amiodarone  400 mg Per Tube Daily  . B-complex with vitamin C  1 tablet Per Tube Daily  . Chlorhexidine Gluconate Cloth  6 each Topical Daily  . feeding supplement (PRO-STAT SUGAR FREE 64)  60 mL Per Tube BID  . hydrocortisone sod succinate (SOLU-CORTEF) inj  50 mg Intravenous Q6H  . influenza vac split quadrivalent PF  0.5 mL Intramuscular Tomorrow-1000  . lactulose  10 g Per Tube TID  . mouth rinse  15 mL Mouth Rinse BID  . midodrine  10 mg Oral TID WC  . pantoprazole (PROTONIX) IV  40 mg Intravenous Q12H  . sodium chloride flush  10-40 mL Intracatheter Q12H   Place/Maintain arterial line **AND** sodium chloride, heparin, ipratropium-albuterol, labetalol, sodium chloride, sodium chloride flush  Assessment/ Plan:   ESRD -usually Tuesday Thursday Saturday dialysis.  Initiated with CRRT 11/04/2018 appears to be stable.  No changes to prescription at this time.  Acute hypoxic respiratory failure following cardiac arrest patient intubated now extubated 11/06/2018  Status post cardiac arrest due to excessive sedation reduced LV function appreciate assistance from Dr. Einar Gip.  Diffuse hypokinesia on 2D echo no regional wall abnormality  Shock continues on pressors IV Levophed   Continues on Maxipime 2 g every 12  hours  Acute encephalopathy.  Receiving lactulose.  Hypoxic brain injury.  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 in situ interrogated 11/04/2018 no significant arrhythmias appreciate assistance  Lower GI bleed appreciate assistance from gastroenterology Dr Havery Moros.  Following recommendations.    LOS: [place] @TODAY @7 :28 AM

## 2018-11-24 NOTE — Progress Notes (Signed)
OT Cancellation Note  Patient Details Name: Eric [name] MRN: [id number] DOB: 1955/10/25   Cancelled Treatment:    Reason Eval/Treat Not Completed: Patient not medically ready(Will continue to follow.)  Malka So 11/10/2018, 12:59 PM  Nestor Lewandowsky, OTR/L Acute Rehabilitation Services Pager: [phone] Office: [phone]

## 2018-11-24 NOTE — Progress Notes (Signed)
[facility] Progress Note Patient Name: Eric [name] DOB: September 29, 1955 MRN: [id number]   Date of Service  12/07/18  HPI/Events of Note  Phosphorus level 1.6  eICU Interventions  Elink electrolyte replacement protocol for phosphorus ordered.        Kerry Kass Ogan 12-07-18, 6:27 AM

## 2018-11-24 NOTE — Progress Notes (Signed)
PULMONARY / CRITICAL CARE MEDICINE   NAME:  Eric [name], MRN:  [id number], DOB:  1955-08-23, LOS: 5 ADMISSION DATE:  10/30/2018,  REFERRING MD: 30 department physician, CHIEF COMPLAINT: Status post- arrest  BRIEF HISTORY:    63 year old male with multiple medical comorbidities of cardiac arrest most likely secondary to respiratory event narcotics intubated in the emergency department on 11/22/2018. HISTORY OF PRESENT ILLNESS   63 year old with a plethora of health issues with included although not exclusive of hepatitis C, coronary artery disease status post ICD with a history of cardiac arrest.  Cirrhosis with normal ascites requiring monthly paracentesis.  End-stage renal disease but the right forearm AV graft.  Hypertension.  Type 2 diabetes mellitus.  He was in his usual state of poor health until 10/22/202021 is having significant pain primarily chest EMS was activated he was placed in the unit fentanyl 50 mcg notable was given to titrate his pain away.  Unfortunately 200 mcg in his right emergency department related to what is presumed to cardiac arrest respiratory insufficiency.  He required a short period of chest compressions with return of spontaneous circulation. At the time of this dictation he is not requiring vasoactive pressors.  Still on oral mechanical monitory support with FiO2 down down to 40%.  He did have some bloody secretions overnight.  He does have cirrhosis and there is has a coagulopathic state. Pulmonary critical care asked to admit.  We will not pursue temperature guided hypothermia due to his multiple comorbidities.  Currently he is a full code with his multiple health issues consideration may be made to address goals of care in the near future. SIGNIFICANT PAST MEDICAL HISTORY   Hepatitis C positive Diabetes mellitus Coronary artery disease status post cardiac arrest x2 AICD in place Hypertension Cirrhosis Congestive heart failure End-stage renal  disease  SIGNIFICANT EVENTS:  10/26/2018 cardiac arrest 10/14 extubated STUDIES:   11/05/2018 echo- LVEF 40 to 45% with mildly reduced LV function.  Normal RV size and function, normal atria.  Moderate to severe TR, otherwise normal valves. (Comparison 09/13/2018 echo- LVEF 55 to 60%, severely reduced RV systolic function)  CTA chest-no PE or aortic dissection, right upper lobe infiltrate with air bronchograms.  CULTURES:  10/25/2018 blood cultures x2>> never drawn 10/25/2018 urine culture>> 11/03/2018 respiratory cultures- abundant PMN, moderate GPR and GNR, few GPC Serratia marcescens sensitive to cefepime   ANTIBIOTICS:  11/01/2018 Unasyn for at least 3 days secondary to questionable aspiration while chest compressions>>  LINES/TUBES:  11/15/2018 endotracheal.>> 10/29/2018 right EJ>> 11/05/2018 CVL>> CONSULTANTS:  11/02/2018 cardiology SUBJECTIVE:  Agitated this AM.  Tolerated extubation well.  Interfering with lines and equipment.  Off levophed.  CONSTITUTIONAL: BP (!) 141/126 (BP Location: Left Arm)   Pulse 94   Temp 98.4 F (36.9 C) (Oral)   Resp (!) 24   Ht 5\' 6"  (1.676 m)   Wt 78.1 kg   SpO2 100%   BMI 27.79 kg/m   I/O last 3 completed shifts: In: 7035 [I.V.:1767.1; NG/GT:1025.8; IV Piggyback:600.1] Out: 7274 [Other:7274]     Vent Mode: PSV;CPAP FiO2 (%):  [30 %] 30 % PEEP:  [5 cmH20] 5 cmH20 Pressure Support:  [10 cmH20] 10 cmH20  PHYSICAL EXAM: GEN: agitated elderly man in NAD HEENT: MM dry CV: RRR, ext warm PULM: no wheezing, no accessory muscle use GI: More protuberant, hypoactive BS EXT: chronic venous stasis changes no edema NEURO: Moves all 4 ext to command but not speaking to me PSYCH: psychomotor agitation noted SKIN: lines CDI  10/15 stable mild edema CBC and chemistries stable  RESOLVED PROBLEM LIST   ASSESSMENT AND PLAN   # Acute hypoxic respiratory failure following cardiac arrest. Now off vent. # Cardiac arrest of unclear  origin s/p TTM, global mild reduced EF # Encephalopathy combination delirium and hypoxic brain injury persistent # Underlying HepC cirrhosis and portal hypertension, ascites s/p tap c/w SBP # Persistent hypoglycemia improved with stress steroids, IV dextrose, TF # Serratia bronchitis vs. PNA # Underlying ESRD on HD # Fluctuating Bps mostly related to sedation needs  - CRRT to continue until filter clots then stop and start iHD - Started midodrine last night for vasoplegia, seems to be helping - wean off hydrocortisone (q12h x 2 more doses then stop) - Biggest issue remaining is severe agitation/delirium, QTc remains prolonged limiting options, will do precedex gtt today for safety and try to come up with better delirium plan in next day or so. Re orient as able. - PT/OT/SLP consults appreciated - Need to try to get NGT back in so we can wean off D10 and continue lactulose - Up to chair once off CRRT - Soft wrist restraints to prevent pulling out lines/tubes - May benefit from [facility] at some point if we can get him settled  Best Practice / Goals of Care / Disposition.   DVT PROPHYLAXIS: start heparin GI: PPI BID, switch to PO once NGT back in NUTRITION: TF once tube back in MOBILITY: Bedrest GOALS OF CARE: full code FAMILY DISCUSSIONS: Family updated in person yesterday (daughter) DISPOSITION ICU pending delirium improvement and stability off CRRT  LABS  Glucose Recent Labs  Lab 11/06/18 0801 11/06/18 1137 11/06/18 1527 11/06/18 1935 2018/12/05 0009 12-05-18 0324  GLUCAP 189* 176* 162* 183* 105* 101*    BMET Recent Labs  Lab 11/06/18 0338 11/06/18 1236 11/06/18 1521 05-Dec-2018 0319  NA 134* 137 137 135  K 4.4 5.3* 4.5 4.5  CL 100  --  100 99  CO2 21*  --  22 23  BUN 17  --  14 18  CREATININE 2.40*  --  2.06* 1.75*  GLUCOSE 230*  --  175* 129*    Liver Enzymes Recent Labs  Lab 11/13/2018 0753 11/03/18 0324  11/06/18 0338 11/06/18 1521 12/05/2018 0319  AST 31 31  --    --   --   --   ALT 15 13  --   --   --   --   ALKPHOS 52 47  --   --   --   --   BILITOT 1.0 0.7  --   --   --   --   ALBUMIN 2.8* 2.4*   < > 2.7* 3.2* 3.0*   < > = values in this interval not displayed.    Electrolytes Recent Labs  Lab 11/05/18 0416  11/06/18 0338 11/06/18 1521 12/05/18 0319  CALCIUM 8.4*   < > 8.5* 8.7* 8.8*  MG 2.3  --  2.5*  --  2.6*  PHOS 4.0   < > 2.9 2.9 1.6*   < > = values in this interval not displayed.    CBC Recent Labs  Lab 11/04/18 1325 11/05/18 0416 11/05/18 1518 11/06/18 1236 05-Dec-2018 0319  WBC 6.4 11.0*  --   --  10.0  HGB 9.6* 9.8* 9.9* 10.9* 8.6*  HCT 30.5* 30.8* 29.0* 32.0* 28.2*  PLT 75* 91*  --   --  84*    ABG Recent Labs  Lab 11/03/18 0510 11/05/18 1518 11/06/18  1236  PHART 7.550* 7.284* 7.291*  PCO2ART 28.6* 50.9* 51.0*  PO2ART 124.0* 172.0* 108.0    Coag's Recent Labs  Lab 11/01/2018 1435 11/03/18 0841 11/04/18 0524 11/04/18 1346  APTT 33 33  --   --   INR 1.9* 1.9* 1.8* 1.9*    Sepsis Markers Recent Labs  Lab 11/12/2018 1436 10/24/2018 2006 11/03/18 1735 11/03/18 2002 11/05/18 0909  LATICACIDVEN 1.9 2.6* 3.9* 2.1*  --   PROCALCITON 0.64  --   --   --  24.85    Cardiac Enzymes No results for input(s): TROPONINI, PROBNP in the last 168 hours.    35 mins CC time Erskine Emery MD PCCM

## 2018-11-24 NOTE — Progress Notes (Signed)
Patient pulled out NG tube. NG tube placement was attempted X2. Elink was notified. Will pass along to RN about placing another NG tube. Will continue to monitor.

## 2018-11-24 NOTE — Procedures (Signed)
Eliseo Gum NP Erskine Emery MD  Called to room for code blue. Patient became unresponsive suddenly. Copious vomitus found around patient. Despite about 15 minutes high quality chest compressions, multiple rounds of epi/mag/bicarb/calcium, several shocks, patient was unable to be revived.  Intubated with good tube position confirmed with bronchoscope.  No pericardial effusion with [place], complete lack of cardiac function.  Family en route.

## 2018-11-24 NOTE — Progress Notes (Addendum)
Pt became unresponsive around 1400 while this RN at bedside. SBP in 60's MAP of 44. Levophed started; CRRT placed on net zero; precedex paused, and I requested assistance from another RN. Pt then stopped breathing so we began to bag pt. I called RT who responded immediately.  Pt vomited while being bagged. Pt was placed on side and mouth was suctioned. Pt suddenly lost pulse, compressions were started, and code blue was initiated.

## 2018-11-24 DEATH — deceased

## 2018-12-04 ENCOUNTER — Ambulatory Visit: Payer: Medicare Other | Admitting: Infectious Diseases

## 2020-02-08 IMAGING — CT CT ABDOMEN AND PELVIS WITHOUT CONTRAST
2 of 4 series · 16 of 46 positions shown, 18 images · non-contrast
Comparison: None.

CLINICAL DATA: Bowel obstruction, right lower quadrant pain

EXAM:
CT ABDOMEN AND PELVIS WITHOUT CONTRAST
TECHNIQUE: Multidetector CT imaging of the abdomen and pelvis was performed
following the standard protocol without IV contrast.

[Series 3: a/p w/o 5mm · axial · non-contrast · 0.93mm/px · z∈[+935,+1410]mm · 13 of 105 slices shown, 15 images]
[im 5/105  soft-tissue]
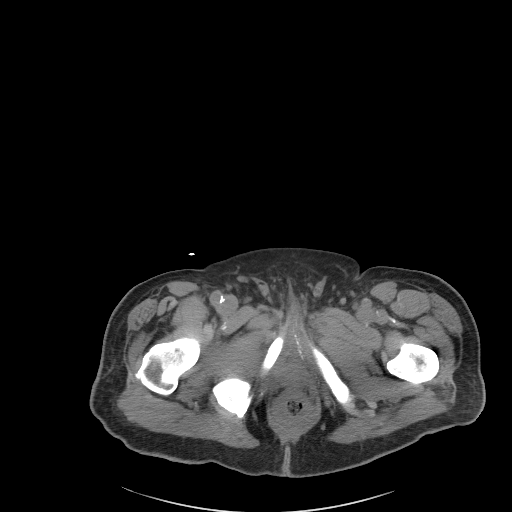
[im 5/105  bone]
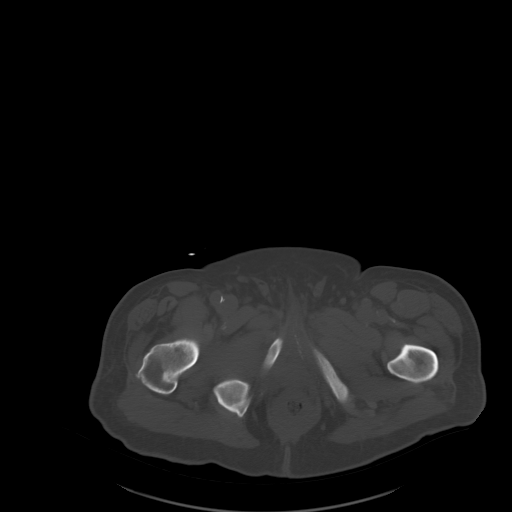
[im 14/105  soft-tissue]
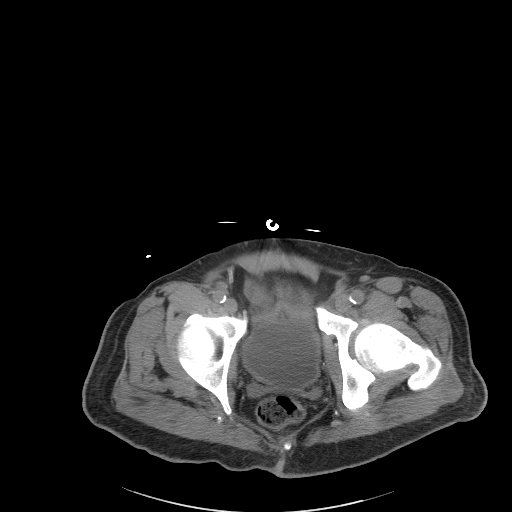
[im 22/105  soft-tissue]
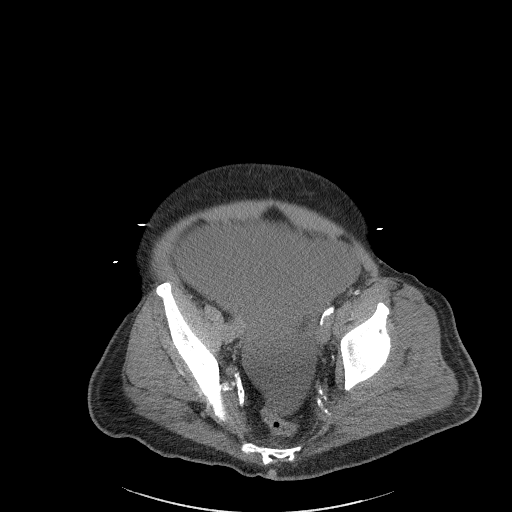
[im 31/105  soft-tissue]
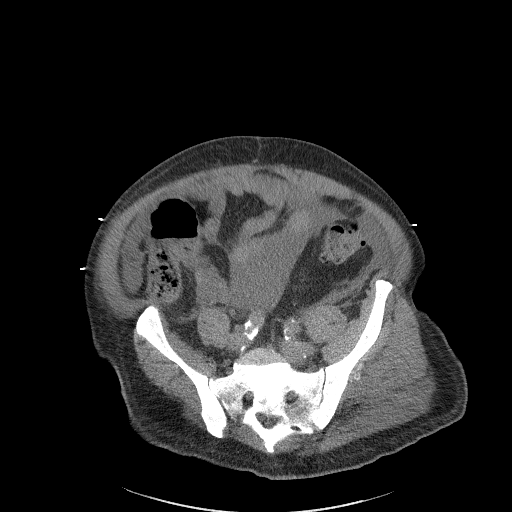
[im 35/105  soft-tissue]
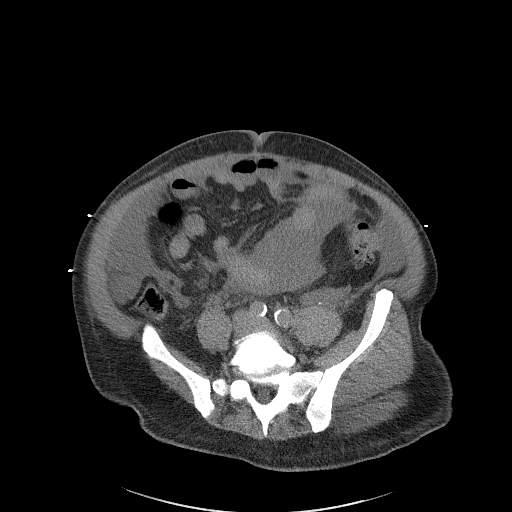
[im 44/105  soft-tissue]
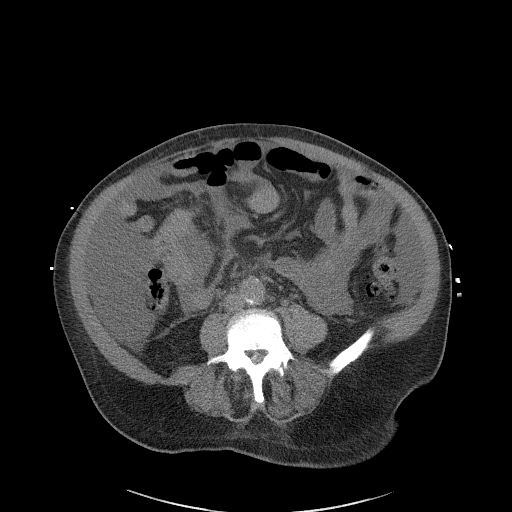
[im 53/105  soft-tissue]
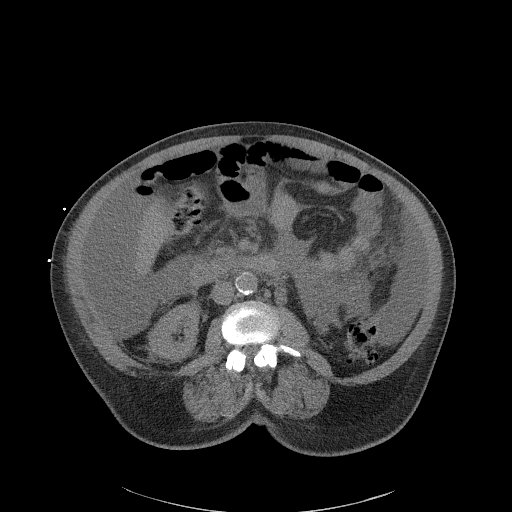
[im 61/105  soft-tissue]
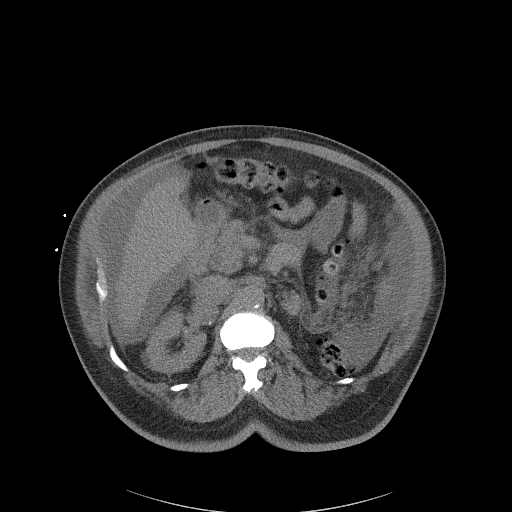
[im 70/105  soft-tissue]
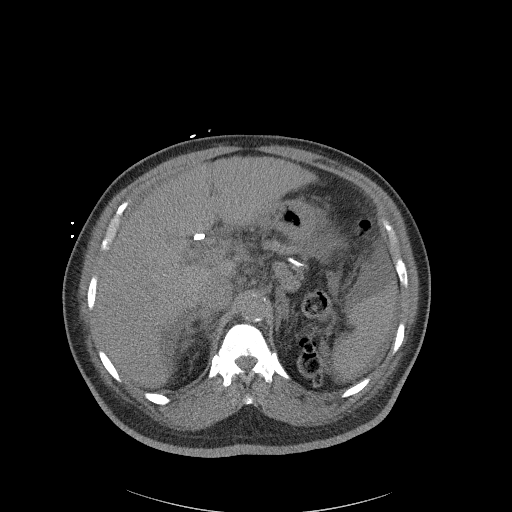
[im 70/105  bone]
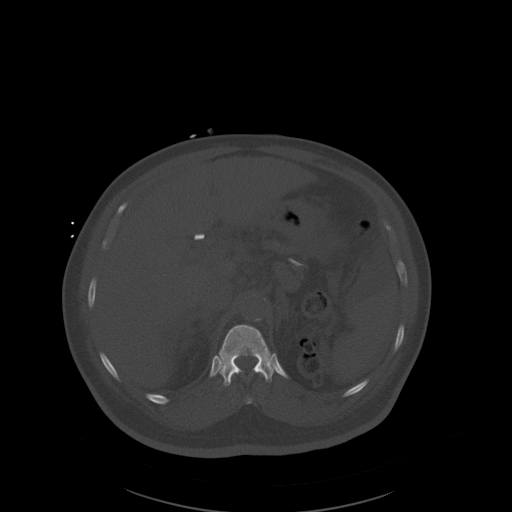
[im 74/105  soft-tissue]
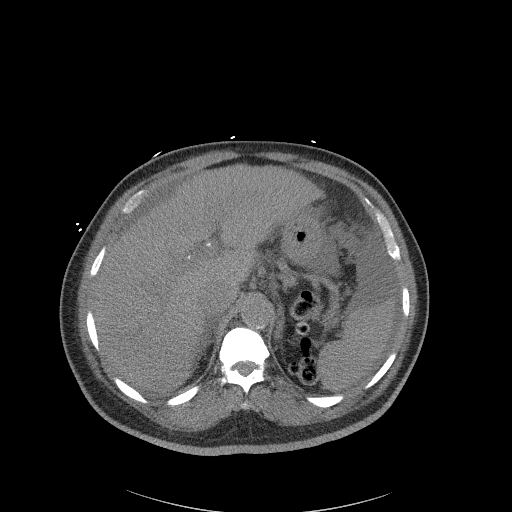
[im 83/105  soft-tissue]
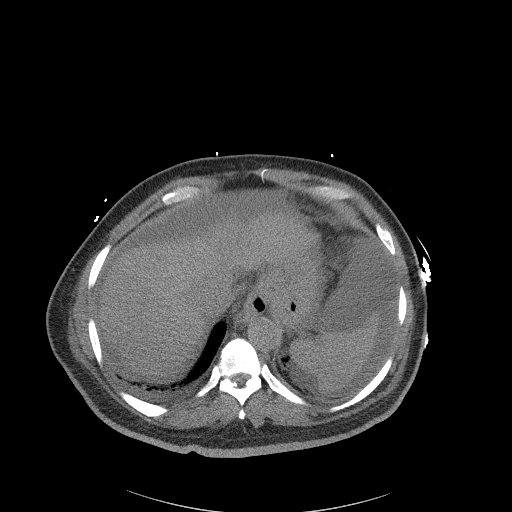
[im 92/105  soft-tissue]
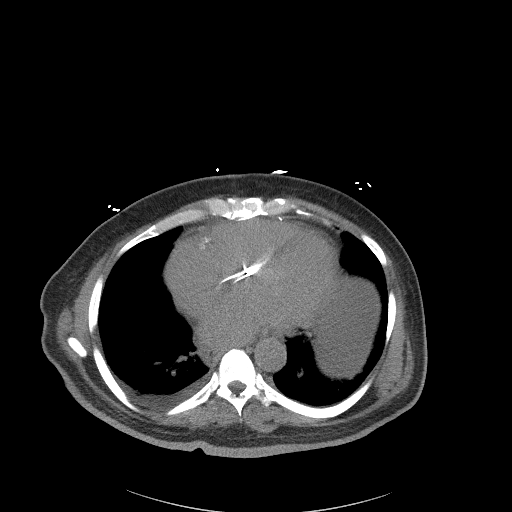
[im 100/105  soft-tissue]
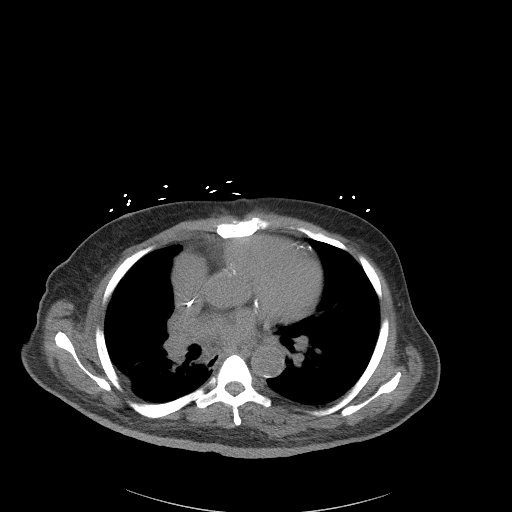

[Series 6: a/p w/o cor · coronal · non-contrast · 0.99mm/px · 3 of 174 slices shown]
[im 58/174  soft-tissue]
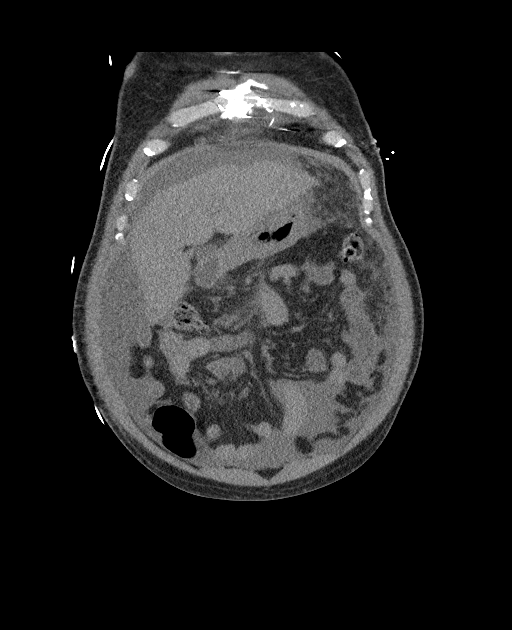
[im 77/174  soft-tissue]
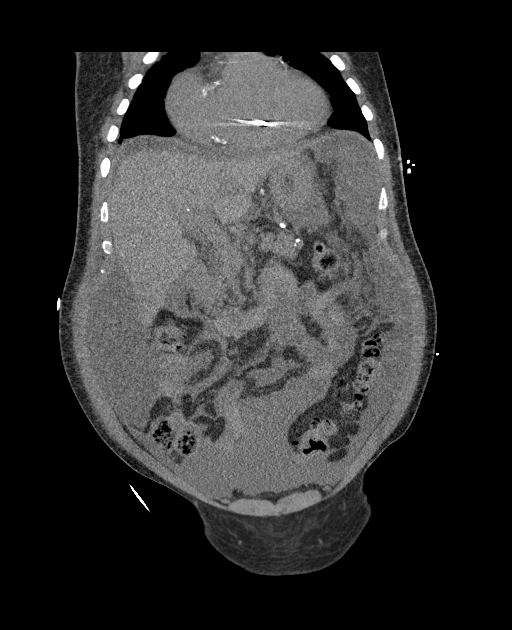
[im 97/174  soft-tissue]
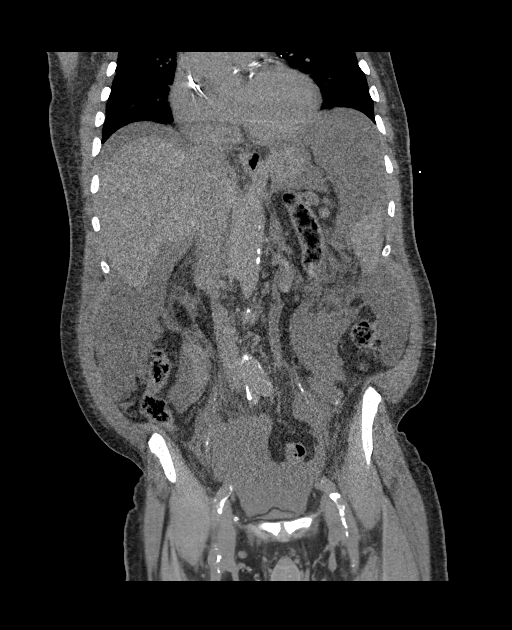

[16 of 46 positions shown; findings below may reference images not displayed]

FINDINGS: Lower chest: Small right pleural effusion and associated bibasilar
atelectasis. Cardiomegaly and three-vessel coronary artery
calcifications. Partially imaged pacer defibrillator leads.

Hepatobiliary: Coarse and nodular contour of the liver. Status post
cholecystectomy. No biliary dilatation.

Pancreas: Unremarkable. No pancreatic ductal dilatation or
surrounding inflammatory changes.

Spleen: Normal in size without significant abnormality.

Adrenals/Urinary Tract: Adrenal glands are unremarkable. Very
atrophic left kidney. Tiny nonobstructive calculi of the midportion
right kidney. Bladder is unremarkable.

Stomach/Bowel: Stomach is within normal limits. Appendix appears
normal. No evidence of bowel wall thickening, distention, or
inflammatory changes. Severe colonic diverticulosis.

Vascular/Lymphatic: Aortic atherosclerosis. No enlarged abdominal or
pelvic lymph nodes.

Reproductive: No mass or other significant abnormality.

Other: No abdominal wall hernia or abnormality. There is nodularity
and stranding of the omentum and mesentery in the left upper
quadrant (series 3, image 40). Moderate volume ascites.

Musculoskeletal: No acute or significant osseous findings.
IMPRESSION: 1. Coarse and nodular contour of the liver with moderate volume
ascites, findings suggestive of cirrhosis.

2. There is nodularity and stranding of the omentum and mesentery in
the left upper quadrant (series 3, image 40), of uncertain
significance. Peritoneal metastatic disease is not strictly excluded
although there is no obvious primary lesion identified on this
noncontrast examination.

3.  No evidence of bowel obstruction.

4. Other chronic, incidental, and postoperative findings as detailed
above.

## 2020-02-08 IMAGING — US IR PARACENTESIS
1 series · 2 of 2 positions shown · non-contrast
Comparison: none

INDICATION: Patient with history of HF, ESRD on HD, liver cirrhosis secondary to
hepatitis C, abdominal distension, and ascites. Request is made for
diagnostic and therapeutic paracentesis.

[Series 1: ir (id) (id)/(id)/(id) ir · 2 of 2 slices shown]
[im 1/2]
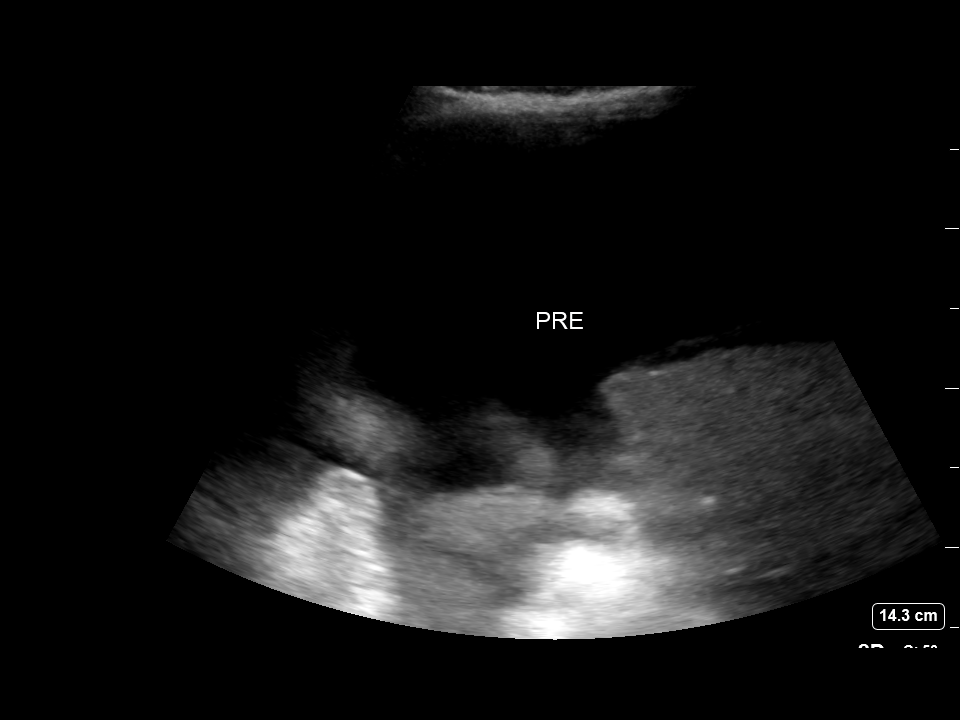
[im 2/2]
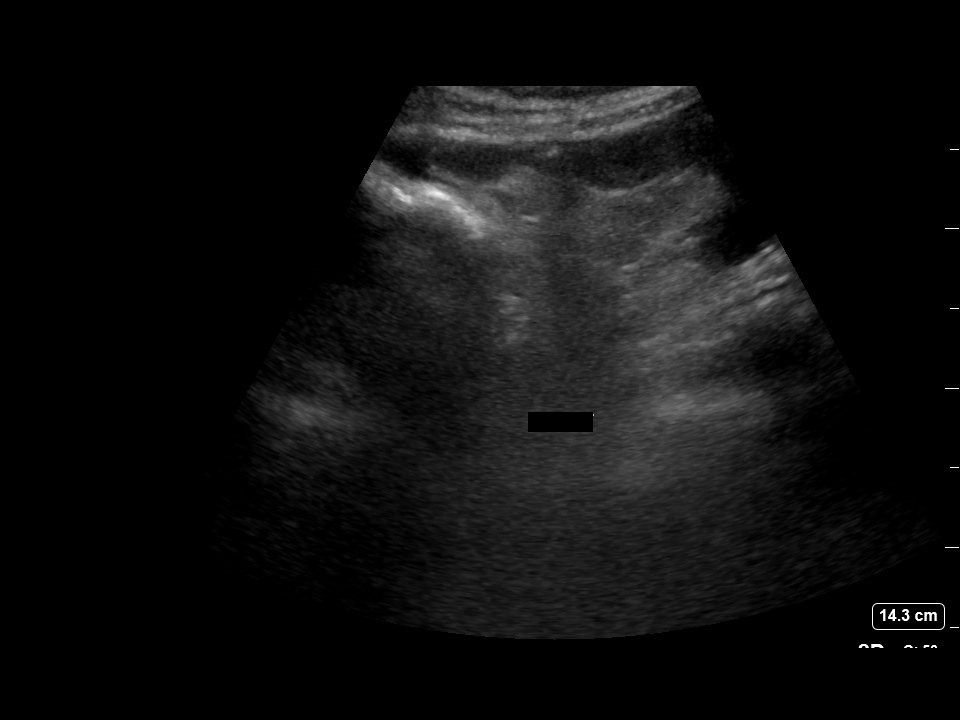

[2 of 2 positions shown; findings below may reference images not displayed]

EXAM:
ULTRASOUND GUIDED DIAGNOSTIC AND THERAPEUTIC PARACENTESIS

MEDICATIONS:
10 mL 1% lidocaine

COMPLICATIONS:
None immediate.

PROCEDURE:
Informed written consent was obtained from the patient after a
discussion of the risks, benefits and alternatives to treatment. A
timeout was performed prior to the initiation of the procedure.

Initial ultrasound scanning demonstrates a large amount of ascites
within the right lower abdominal quadrant. The right lower abdomen
was prepped and draped in the usual sterile fashion. 1% lidocaine
was used for local anesthesia.

Following this, a 19 gauge, 7-cm, Yueh catheter was introduced. An
ultrasound image was saved for documentation purposes. The
paracentesis was performed. The catheter was removed and a dressing
was applied. The patient tolerated the procedure well without
immediate post procedural complication.
FINDINGS: A total of approximately 3.7 L of hazy amber fluid was removed.
Samples were sent to the laboratory as requested by the clinical
team.
IMPRESSION: Successful ultrasound-guided paracentesis yielding 3.7 L of
peritoneal fluid.
